# Patient Record
Sex: Female | Born: 1935 | Race: White | Hispanic: No | Smoking: Never smoker
Health system: Southern US, Community
[De-identification: ages and names within clinical notes are randomized; demographics above are authoritative.]

## PROBLEM LIST (undated history)

## (undated) DIAGNOSIS — F03A0 Unspecified dementia, mild, without behavioral disturbance, psychotic disturbance, mood disturbance, and anxiety: Secondary | ICD-10-CM

## (undated) DIAGNOSIS — E538 Deficiency of other specified B group vitamins: Secondary | ICD-10-CM

## (undated) DIAGNOSIS — R251 Tremor, unspecified: Secondary | ICD-10-CM

## (undated) DIAGNOSIS — D649 Anemia, unspecified: Secondary | ICD-10-CM

## (undated) DIAGNOSIS — G20 Parkinson's disease: Secondary | ICD-10-CM

## (undated) DIAGNOSIS — F0390 Unspecified dementia without behavioral disturbance: Secondary | ICD-10-CM

## (undated) DIAGNOSIS — S42309A Unspecified fracture of shaft of humerus, unspecified arm, initial encounter for closed fracture: Secondary | ICD-10-CM

## (undated) DIAGNOSIS — I10 Essential (primary) hypertension: Secondary | ICD-10-CM

## (undated) DIAGNOSIS — M1990 Unspecified osteoarthritis, unspecified site: Secondary | ICD-10-CM

## (undated) DIAGNOSIS — G20A1 Parkinson's disease without dyskinesia, without mention of fluctuations: Secondary | ICD-10-CM

## (undated) HISTORY — DX: Unspecified fracture of shaft of humerus, unspecified arm, initial encounter for closed fracture: S42.309A

## (undated) HISTORY — DX: Tremor, unspecified: R25.1

## (undated) HISTORY — PX: JOINT REPLACEMENT: SHX530

## (undated) HISTORY — DX: Anemia, unspecified: D64.9

## (undated) HISTORY — PX: ABDOMINAL HYSTERECTOMY: SHX81

## (undated) HISTORY — DX: Unspecified osteoarthritis, unspecified site: M19.90

## (undated) HISTORY — DX: Essential (primary) hypertension: I10

---

## 1998-04-13 ENCOUNTER — Ambulatory Visit (HOSPITAL_COMMUNITY): Admission: RE | Admit: 1998-04-13 | Discharge: 1998-04-13 | Payer: Self-pay | Admitting: Obstetrics & Gynecology

## 2003-06-01 ENCOUNTER — Encounter: Payer: Self-pay | Admitting: Orthopedic Surgery

## 2003-06-12 ENCOUNTER — Inpatient Hospital Stay (HOSPITAL_COMMUNITY): Admission: RE | Admit: 2003-06-12 | Discharge: 2003-06-17 | Payer: Self-pay | Admitting: Orthopedic Surgery

## 2003-06-15 ENCOUNTER — Encounter: Payer: Self-pay | Admitting: Orthopedic Surgery

## 2005-06-30 ENCOUNTER — Inpatient Hospital Stay (HOSPITAL_COMMUNITY): Admission: RE | Admit: 2005-06-30 | Discharge: 2005-07-04 | Payer: Self-pay | Admitting: Orthopedic Surgery

## 2005-07-22 ENCOUNTER — Encounter: Payer: Self-pay | Admitting: Orthopedic Surgery

## 2005-07-30 ENCOUNTER — Encounter: Payer: Self-pay | Admitting: Orthopedic Surgery

## 2005-08-29 ENCOUNTER — Encounter: Payer: Self-pay | Admitting: Orthopedic Surgery

## 2006-01-26 ENCOUNTER — Ambulatory Visit: Payer: Self-pay | Admitting: Ophthalmology

## 2009-11-08 ENCOUNTER — Ambulatory Visit: Payer: Self-pay | Admitting: Family Medicine

## 2009-12-06 ENCOUNTER — Ambulatory Visit: Payer: Self-pay | Admitting: Family Medicine

## 2009-12-18 ENCOUNTER — Ambulatory Visit: Payer: Self-pay | Admitting: Family Medicine

## 2010-06-24 ENCOUNTER — Ambulatory Visit: Payer: Self-pay | Admitting: Family Medicine

## 2010-12-11 ENCOUNTER — Ambulatory Visit: Payer: Self-pay | Admitting: Family Medicine

## 2011-12-25 ENCOUNTER — Ambulatory Visit: Payer: Self-pay | Admitting: Family Medicine

## 2013-01-12 ENCOUNTER — Ambulatory Visit: Payer: Self-pay | Admitting: Family Medicine

## 2015-07-04 ENCOUNTER — Encounter: Payer: Self-pay | Admitting: Family Medicine

## 2015-07-04 ENCOUNTER — Ambulatory Visit (INDEPENDENT_AMBULATORY_CARE_PROVIDER_SITE_OTHER): Payer: Medicare Other | Admitting: Family Medicine

## 2015-07-04 VITALS — BP 163/63 | HR 55 | Temp 97.7°F | Ht 58.1 in | Wt 100.0 lb

## 2015-07-04 DIAGNOSIS — I10 Essential (primary) hypertension: Secondary | ICD-10-CM

## 2015-07-04 DIAGNOSIS — Z23 Encounter for immunization: Secondary | ICD-10-CM

## 2015-07-04 DIAGNOSIS — R251 Tremor, unspecified: Secondary | ICD-10-CM | POA: Diagnosis not present

## 2015-07-04 MED ORDER — METOPROLOL SUCCINATE ER 50 MG PO TB24: 50.0000 mg | ORAL_TABLET | Freq: Every day | ORAL | Status: DC

## 2015-07-04 NOTE — Assessment & Plan Note (Signed)
The current medical regimen is effective;  continue present plan and medications.  

## 2015-07-04 NOTE — Assessment & Plan Note (Signed)
Followed by neurology stable 

## 2015-07-04 NOTE — Progress Notes (Signed)
   BP 163/63 mmHg  Pulse 55  Temp(Src) 97.7 F (36.5 C)  Ht 4' 10.1" (1.476 m)  Wt 100 lb (45.36 kg)  BMI 20.82 kg/m2  SpO2 99%   Subjective:    Patient ID: [name], female    DOB: November 08, 1922, 81 y.o.   MRN: [id number]  HPI: [name] is a 81 y.o. female  Chief Complaint  Patient presents with  . Hypertension   patient recheck has been doing well with blood pressure no complaints Patient here with her daughter who provides substantial history and is her caregiver. Patient's tremor medications be adjusted by neurology May be helping some especially with drowsiness.   Relevant past medical, surgical, family and social history reviewed and updated as indicated. Interim medical history since our last visit reviewed. Allergies and medications reviewed and updated.  Review of Systems  Constitutional: Negative.   Respiratory: Negative.   Cardiovascular: Negative.     Per HPI unless specifically indicated above     Objective:    BP 163/63 mmHg  Pulse 55  Temp(Src) 97.7 F (36.5 C)  Ht 4' 10.1" (1.476 m)  Wt 100 lb (45.36 kg)  BMI 20.82 kg/m2  SpO2 99%  Wt Readings from Last 3 Encounters:  07/04/15 100 lb (45.36 kg)  12/20/14 103 lb (46.72 kg)    Physical Exam  Constitutional: She is oriented to person, place, and time. She appears well-developed and well-nourished. No distress.  HENT:  Head: Normocephalic and atraumatic.  Right Ear: Hearing normal.  Left Ear: Hearing normal.  Nose: Nose normal.  Eyes: Conjunctivae and lids are normal. Right eye exhibits no discharge. Left eye exhibits no discharge. No scleral icterus.  Cardiovascular: Normal rate, regular rhythm and normal heart sounds.   Pulmonary/Chest: Effort normal and breath sounds normal. No respiratory distress.  Musculoskeletal: Normal range of motion.  Neurological: She is alert and oriented to person, place, and time.  Skin: Skin is intact. No rash noted.  Psychiatric: She has a normal  mood and affect. Her speech is normal and behavior is normal. Judgment and thought content normal. Cognition and memory are normal.    No results found for this or any previous visit.    Assessment & Plan:   Problem List Items Addressed This Visit      Cardiovascular and Mediastinum   Hypertension    The current medical regimen is effective;  continue present plan and medications.       Relevant Medications   metoprolol succinate (TOPROL-XL) 50 MG 24 hr tablet   Other Relevant Orders   Basic metabolic panel     Other   Tremor    Followed by neurology stable       Other Visit Diagnoses    Immunization due    -  Primary    Relevant Orders    Flu Vaccine QUAD 36+ mos PF IM (Fluarix & Fluzone Quad PF) (Completed)       Patient becoming more feable discuss caregiving issues Follow up plan: Return in about 6 months (around 01/02/2016), or if symptoms worsen or fail to improve, for Physical Exam.

## 2015-07-05 ENCOUNTER — Encounter: Payer: Self-pay | Admitting: Family Medicine

## 2015-07-05 LAB — BASIC METABOLIC PANEL
BUN/Creatinine Ratio: 33 — ABNORMAL HIGH (ref 11–26)
BUN: 25 mg/dL (ref 10–36)
CO2: 26 mmol/L (ref 18–29)
Calcium: 9.6 mg/dL (ref 8.7–10.3)
Chloride: 102 mmol/L (ref 97–108)
Creatinine, Ser: 0.76 mg/dL (ref 0.57–1.00)
GFR calc Af Amer: 80 mL/min/{1.73_m2} (ref >59)
GFR calc non Af Amer: 69 mL/min/{1.73_m2} (ref >59)
Glucose: 105 mg/dL — ABNORMAL HIGH (ref 65–99)
Potassium: 4.9 mmol/L (ref 3.5–5.2)
Sodium: 142 mmol/L (ref 134–144)

## 2015-08-14 ENCOUNTER — Other Ambulatory Visit: Payer: Self-pay | Admitting: Family Medicine

## 2015-10-10 ENCOUNTER — Telehealth: Payer: Self-pay | Admitting: Family Medicine

## 2015-10-10 NOTE — Telephone Encounter (Signed)
This is not urgent.  It can wait unless she has any signs of bleeding

## 2015-10-10 NOTE — Telephone Encounter (Signed)
Pt's daughter Luster LandsbergRenee (on [place] in [place] called stated pt has petechiae on her lower leg and they would like to have her evaluated. Would it be ok to wait until next week to have her evaluated or should this be tended to urgently? Please call pt's daughter ASAP. Thanks.

## 2015-10-10 NOTE — Telephone Encounter (Signed)
Routing to provider  

## 2015-10-10 NOTE — Telephone Encounter (Signed)
Called and spoke to patient's daughter. She stated there was no bleeding and already has appointment scheduled for Monday. She stated they would just keep that appointment.

## 2015-10-15 ENCOUNTER — Ambulatory Visit: Payer: Medicare Other | Admitting: Family Medicine

## 2015-10-23 ENCOUNTER — Ambulatory Visit: Payer: Medicare Other | Admitting: Family Medicine

## 2015-10-29 ENCOUNTER — Telehealth: Payer: Self-pay | Admitting: Family Medicine

## 2015-10-29 ENCOUNTER — Ambulatory Visit (INDEPENDENT_AMBULATORY_CARE_PROVIDER_SITE_OTHER): Payer: Medicare Other | Admitting: Family Medicine

## 2015-10-29 ENCOUNTER — Ambulatory Visit
Admission: RE | Admit: 2015-10-29 | Discharge: 2015-10-29 | Disposition: A | Discharge status: Home / Self Care | Payer: Medicare Other | Source: Ambulatory Visit | Attending: Family Medicine | Admitting: Family Medicine

## 2015-10-29 ENCOUNTER — Encounter: Payer: Self-pay | Admitting: Family Medicine

## 2015-10-29 VITALS — BP 152/63 | HR 74 | Temp 98.2°F | Wt 101.0 lb

## 2015-10-29 DIAGNOSIS — S2231XA Fracture of one rib, right side, initial encounter for closed fracture: Secondary | ICD-10-CM | POA: Diagnosis not present

## 2015-10-29 DIAGNOSIS — R918 Other nonspecific abnormal finding of lung field: Secondary | ICD-10-CM | POA: Diagnosis not present

## 2015-10-29 DIAGNOSIS — R350 Frequency of micturition: Secondary | ICD-10-CM | POA: Diagnosis not present

## 2015-10-29 DIAGNOSIS — R0781 Pleurodynia: Secondary | ICD-10-CM | POA: Diagnosis present

## 2015-10-29 DIAGNOSIS — R21 Rash and other nonspecific skin eruption: Secondary | ICD-10-CM | POA: Diagnosis not present

## 2015-10-29 DIAGNOSIS — N3000 Acute cystitis without hematuria: Secondary | ICD-10-CM

## 2015-10-29 DIAGNOSIS — W19XXXA Unspecified fall, initial encounter: Secondary | ICD-10-CM

## 2015-10-29 DIAGNOSIS — S2249XA Multiple fractures of ribs, unspecified side, initial encounter for closed fracture: Secondary | ICD-10-CM | POA: Insufficient documentation

## 2015-10-29 DIAGNOSIS — E538 Deficiency of other specified B group vitamins: Secondary | ICD-10-CM | POA: Diagnosis not present

## 2015-10-29 DIAGNOSIS — S2241XA Multiple fractures of ribs, right side, initial encounter for closed fracture: Secondary | ICD-10-CM

## 2015-10-29 LAB — CBC WITH DIFFERENTIAL/PLATELET
Hematocrit: 39.9 % (ref 34.0–46.6)
Hemoglobin: 13.5 g/dL (ref 11.1–15.9)
Lymphocytes Absolute: 0.9 10*3/uL (ref 0.7–3.1)
Lymphs: 10 %
MCH: 31.6 pg (ref 26.6–33.0)
MCHC: 33.8 g/dL (ref 31.5–35.7)
MCV: 93 fL (ref 79–97)
MID (Absolute): 0.9 10*3/uL (ref 0.1–1.6)
MID: 10 %
Neutrophils Absolute: 7.3 10*3/uL — ABNORMAL HIGH (ref 1.4–7.0)
Neutrophils: 80 %
Platelets: 244 10*3/uL (ref 150–379)
RBC: 4.27 x10E6/uL (ref 3.77–5.28)
RDW: 13.2 % (ref 12.3–15.4)
WBC: 9.1 10*3/uL (ref 3.4–10.8)

## 2015-10-29 MED ORDER — CIPROFLOXACIN HCL 500 MG PO TABS: 500.0000 mg | ORAL_TABLET | Freq: Two times a day (BID) | ORAL | Status: DC

## 2015-10-29 MED ORDER — MELOXICAM 7.5 MG PO TABS: 7.5000 mg | ORAL_TABLET | Freq: Every day | ORAL | Status: DC
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
## 2015-10-29 NOTE — Telephone Encounter (Signed)
Called and spoke to [place]. Mom fractured Ribs 6-9 with some mild displacement. Will refer to orthopedics due to this displacement. They would like her to see [facility] as they have seen her previously. Referral generated today. Discussed warning signs for which she should go to the ER. They are aware.

## 2015-10-30 ENCOUNTER — Ambulatory Visit: Payer: Medicare Other | Admitting: Family Medicine

## 2015-10-30 ENCOUNTER — Telehealth: Payer: Self-pay | Admitting: Family Medicine

## 2015-10-30 LAB — COMPREHENSIVE METABOLIC PANEL
ALT: 10 IU/L (ref 0–32)
AST: 19 IU/L (ref 0–40)
Albumin/Globulin Ratio: 2 (ref 1.1–2.5)
Albumin: 4.1 g/dL (ref 3.2–4.6)
Alkaline Phosphatase: 71 IU/L (ref 39–117)
BUN/Creatinine Ratio: 27 — ABNORMAL HIGH (ref 11–26)
BUN: 21 mg/dL (ref 10–36)
Bilirubin Total: 0.4 mg/dL (ref 0.0–1.2)
CO2: 23 mmol/L (ref 18–29)
Calcium: 9.4 mg/dL (ref 8.7–10.3)
Chloride: 103 mmol/L (ref 96–106)
Creatinine, Ser: 0.79 mg/dL (ref 0.57–1.00)
GFR calc Af Amer: 76 mL/min/{1.73_m2} (ref >59)
GFR calc non Af Amer: 66 mL/min/{1.73_m2} (ref >59)
Globulin, Total: 2.1 g/dL (ref 1.5–4.5)
Glucose: 110 mg/dL — ABNORMAL HIGH (ref 65–99)
Potassium: 4.5 mmol/L (ref 3.5–5.2)
Sodium: 143 mmol/L (ref 134–144)
Total Protein: 6.2 g/dL (ref 6.0–8.5)

## 2015-10-30 LAB — UA/M W/RFLX CULTURE, ROUTINE
Bilirubin, UA: NEGATIVE
Glucose, UA: NEGATIVE
Ketones, UA: NEGATIVE
Nitrite, UA: NEGATIVE
RBC, UA: NEGATIVE
Specific Gravity, UA: 1.02 (ref 1.005–1.030)
Urobilinogen, Ur: 0.2 mg/dL (ref 0.2–1.0)
pH, UA: 6.5 (ref 5.0–7.5)

## 2015-10-30 LAB — MICROSCOPIC EXAMINATION

## 2015-10-30 LAB — URINE CULTURE, REFLEX

## 2015-10-30 LAB — B12 AND FOLATE PANEL
Folate: >20 ng/mL (ref >3.0)
Vitamin B-12: 173 pg/mL — ABNORMAL LOW (ref 211–946)

## 2015-10-30 MED ORDER — CYANOCOBALAMIN 1000 MCG/ML IJ SOLN: INTRAMUSCULAR | Status: DC

## 2015-10-30 NOTE — Telephone Encounter (Signed)
Please let Ms. Funches's daughter know that her labs looked normal, but her B12 is low again. We can start her on the B12 protocol of weekly injections for 8 weeks and monthly for 6 months after that.

## 2015-10-30 NOTE — Telephone Encounter (Signed)
Rx sent to her pharmacy 

## 2015-10-30 NOTE — Telephone Encounter (Signed)
Please send in a new prescription for B 12 to [facility]

## 2015-11-02 ENCOUNTER — Other Ambulatory Visit: Payer: Medicare Other

## 2015-11-02 ENCOUNTER — Telehealth: Payer: Self-pay | Admitting: Family Medicine

## 2015-11-02 DIAGNOSIS — R410 Disorientation, unspecified: Secondary | ICD-10-CM

## 2015-11-02 DIAGNOSIS — R300 Dysuria: Secondary | ICD-10-CM

## 2015-11-02 LAB — UA/M W/RFLX CULTURE, ROUTINE
Bilirubin, UA: NEGATIVE
Glucose, UA: NEGATIVE
Ketones, UA: NEGATIVE
Leukocytes, UA: NEGATIVE
Nitrite, UA: NEGATIVE
Protein, UA: NEGATIVE
RBC, UA: NEGATIVE
Specific Gravity, UA: 1.02 (ref 1.005–1.030)
Urobilinogen, Ur: 0.2 mg/dL (ref 0.2–1.0)
pH, UA: 6 (ref 5.0–7.5)

## 2015-11-02 NOTE — Telephone Encounter (Signed)
Pt's daughter has concerns she'd like to discuss with Dr Laural Benes.  Please call her.

## 2015-11-02 NOTE — Telephone Encounter (Signed)
Spoke with patients daughter, she is concerned that she mother is having more confusing again since finishing her antibiotic yesterday morning for her UTI. They will bring in a urine to have it rechecked.  Verbal from Dr.johnson urine order placed

## 2015-11-08 ENCOUNTER — Ambulatory Visit: Payer: Medicare Other | Admitting: Family Medicine

## 2015-11-12 ENCOUNTER — Ambulatory Visit (INDEPENDENT_AMBULATORY_CARE_PROVIDER_SITE_OTHER): Payer: Medicare Other | Admitting: Family Medicine

## 2015-11-12 ENCOUNTER — Encounter: Payer: Self-pay | Admitting: Family Medicine

## 2015-11-12 ENCOUNTER — Ambulatory Visit: Payer: Medicare Other | Admitting: Family Medicine

## 2015-11-12 VITALS — BP 141/58 | HR 57 | Temp 98.2°F | Ht 58.4 in | Wt 101.0 lb

## 2015-11-12 DIAGNOSIS — R410 Disorientation, unspecified: Secondary | ICD-10-CM | POA: Insufficient documentation

## 2015-11-12 DIAGNOSIS — S2241XA Multiple fractures of ribs, right side, initial encounter for closed fracture: Secondary | ICD-10-CM

## 2015-11-12 DIAGNOSIS — E538 Deficiency of other specified B group vitamins: Secondary | ICD-10-CM | POA: Diagnosis not present

## 2015-11-12 NOTE — Progress Notes (Signed)
BP 141/58 mmHg  Pulse 57  Temp(Src) 98.2 F (36.8 C)  Ht 4' 10.4" (1.483 m)  Wt 101 lb (45.813 kg)  BMI 20.83 kg/m2  SpO2 98%   Subjective:    Patient ID: [name], female    DOB: [birth year], 82 y.o.   MRN: [id number]  HPI: [name] is a 82 y.o. female  Chief Complaint  Patient presents with  . uti follow-up    Patient went to urgent care over the weekend for confusion, dip did not show anything except protein. They are waiting for the culture results.   Confusion: Was very confused with 1st UTI- better after treatment, but has been waxing and waning over the past couple of weeks. Came in last week for UA because they were concerned about UTI- urine was clear.  Has been up and down. Went to UC this weekend- had just a little bit of protein in her urine.  Yesterday clear headed. Worse at night for the past couple of times Better today, more like herself. Has been weak and tired. Has not heard from the orthopedist about her lungs yet.  Saw her neurologist and they are cutting her off her synamet because they are concerned that it is making her sleepy. Concern that the confusion may be a bit of dementia. Unclear at this time if that's what's going on. She is otherwise eating well. Not needing any medication for her rib pain. No other concerns or complaints at this time.   Saw neurologist thought that there might be a bit of parkinson's. Medicine hasn't help. They are going to take her off the medicine to see if that helps with the fatigue She occasionally leans to the R occasionally.   Relevant past medical, surgical, family and social history reviewed and updated as indicated. Interim medical history since our last visit reviewed. Allergies and medications reviewed and updated.  Review of Systems  Constitutional: Negative.   Respiratory: Negative.   Cardiovascular: Negative.   Genitourinary: Negative.   Neurological: Positive for tremors.  Psychiatric/Behavioral:  Negative.     Per HPI unless specifically indicated above     Objective:    BP 141/58 mmHg  Pulse 57  Temp(Src) 98.2 F (36.8 C)  Ht 4' 10.4" (1.483 m)  Wt 101 lb (45.813 kg)  BMI 20.83 kg/m2  SpO2 98%  Wt Readings from Last 3 Encounters:  11/12/15 101 lb (45.813 kg)  10/29/15 101 lb (45.813 kg)  07/04/15 100 lb (45.36 kg)    Physical Exam  Constitutional: She is oriented to person, place, and time. She appears well-developed and well-nourished. No distress.  HENT:  Head: Normocephalic and atraumatic.  Right Ear: Hearing normal.  Left Ear: Hearing normal.  Nose: Nose normal.  Eyes: Conjunctivae and lids are normal. Right eye exhibits no discharge. Left eye exhibits no discharge. No scleral icterus.  Cardiovascular: Normal rate, regular rhythm, normal heart sounds and intact distal pulses.  Exam reveals no gallop and no friction rub.   No murmur heard. Pulmonary/Chest: Effort normal and breath sounds normal. No respiratory distress. She has no wheezes. She has no rales. She exhibits no tenderness.  Musculoskeletal: Normal range of motion.  Neurological: She is alert and oriented to person, place, and time.  Skin: Skin is warm, dry and intact. No rash noted. No erythema. No pallor.  Psychiatric: She has a normal mood and affect. Her speech is normal and behavior is normal. Judgment and thought content normal. Cognition and memory are  normal.  Nursing note and vitals reviewed.   Results for orders placed or performed in visit on 11/02/15  UA/M w/rflx Culture, Routine  Result Value Ref Range   Specific Gravity, UA 1.020 1.005 - 1.030   pH, UA 6.0 5.0 - 7.5   Color, UA Yellow Yellow   Appearance Ur Clear Clear   Leukocytes, UA Negative Negative   Protein, UA Negative Negative/Trace   Glucose, UA Negative Negative   Ketones, UA Negative Negative   RBC, UA Negative Negative   Bilirubin, UA Negative Negative   Urobilinogen, Ur 0.2 0.2 - 1.0 mg/dL   Nitrite, UA Negative  Negative      Assessment & Plan:   Problem List Items Addressed This Visit      Digestive   B12 deficiency    Back on B12 injections. Continue to monitor.         Nervous and Auditory   Confusion - Primary    Discussed that this is complicated. Possibly due to falls and fractures. Possibly due to medication from UTI staying in system a little longer. We will check thyroid panel to look for any other causes. Other labs were normal. If continues to happen at night, concern for sun-downing. Continue to follow with neurology. Will do MMSE next visit and continue close follow up.       Relevant Orders   Thyroid Panel With TSH     Musculoskeletal and Integument   Traumatic closed displaced fracture of multiple ribs    Unclear why they haven't heard anything. We will contact orthopedics and see what's going on.           Follow up plan: Return 3-4 weeks, for Recheck ribs/confusion.

## 2015-11-12 NOTE — Assessment & Plan Note (Signed)
Back on B12 injections. Continue to monitor.

## 2015-11-12 NOTE — Assessment & Plan Note (Signed)
Unclear why they haven't heard anything. We will contact orthopedics and see what's going on.

## 2015-11-12 NOTE — Assessment & Plan Note (Signed)
Discussed that this is complicated. Possibly due to falls and fractures. Possibly due to medication from UTI staying in system a little longer. We will check thyroid panel to look for any other causes. Other labs were normal. If continues to happen at night, concern for sun-downing. Continue to follow with neurology. Will do MMSE next visit and continue close follow up.

## 2015-11-13 ENCOUNTER — Telehealth: Payer: Self-pay | Admitting: Family Medicine

## 2015-11-13 LAB — THYROID PANEL WITH TSH
Free Thyroxine Index: 2.9 (ref 1.2–4.9)
T3 Uptake Ratio: 36 % (ref 24–39)
T4, Total: 8 ug/dL (ref 4.5–12.0)
TSH: 1.51 u[IU]/mL (ref 0.450–4.500)

## 2015-11-13 NOTE — Telephone Encounter (Signed)
Please let [name]'s daughter know that her thyroid looked nice and normal.

## 2015-11-13 NOTE — Telephone Encounter (Signed)
Patient's daughter notified.

## 2015-11-24 ENCOUNTER — Other Ambulatory Visit: Payer: Self-pay | Admitting: Family Medicine

## 2015-11-24 NOTE — Telephone Encounter (Signed)
Your patient 

## 2015-11-26 ENCOUNTER — Ambulatory Visit (INDEPENDENT_AMBULATORY_CARE_PROVIDER_SITE_OTHER): Payer: Medicare Other | Admitting: Family Medicine

## 2015-11-26 ENCOUNTER — Encounter: Payer: Self-pay | Admitting: Family Medicine

## 2015-11-26 ENCOUNTER — Ambulatory Visit: Payer: Medicare Other | Admitting: Family Medicine

## 2015-11-26 VITALS — BP 164/53 | HR 58 | Temp 98.0°F | Ht 58.3 in | Wt 101.0 lb

## 2015-11-26 DIAGNOSIS — I10 Essential (primary) hypertension: Secondary | ICD-10-CM

## 2015-11-26 DIAGNOSIS — R410 Disorientation, unspecified: Secondary | ICD-10-CM | POA: Diagnosis not present

## 2015-11-26 DIAGNOSIS — R251 Tremor, unspecified: Secondary | ICD-10-CM | POA: Diagnosis not present

## 2015-11-26 LAB — URINALYSIS, ROUTINE W REFLEX MICROSCOPIC
Bilirubin, UA: NEGATIVE
Glucose, UA: NEGATIVE
Leukocytes, UA: NEGATIVE
Nitrite, UA: NEGATIVE
Protein, UA: NEGATIVE
RBC, UA: NEGATIVE
Specific Gravity, UA: 1.025 (ref 1.005–1.030)
Urobilinogen, Ur: 0.2 mg/dL (ref 0.2–1.0)
pH, UA: 5 (ref 5.0–7.5)

## 2015-11-26 NOTE — Assessment & Plan Note (Signed)
Stable for now normal urinalysis

## 2015-11-26 NOTE — Assessment & Plan Note (Signed)
The current medical regimen is effective;  continue present plan and medications.  

## 2015-11-26 NOTE — Progress Notes (Signed)
BP 164/53 mmHg  Pulse 58  Temp(Src) 98 F (36.7 C)  Ht 4' 10.3" (1.481 m)  Wt 101 lb (45.813 kg)  BMI 20.89 kg/m2  SpO2 98%   Subjective:    Patient ID: [name], female    DOB: September 22, 1923, 82 y.o.   MRN: [id number]  HPI: [name] is a 82 y.o. female  Chief Complaint  Patient presents with  . Follow-up   patient here with daughter who helps provide history patient is been taking less Sinemet as not making any difference may be causing some sedation issues. Wants to make sure no UTI is still some confusion with check urinalysis Otherwise doing well medication without issues Some swelling of her right ankle no pain no known trauma or other irritation  Relevant past medical, surgical, family and social history reviewed and updated as indicated. Interim medical history since our last visit reviewed. Allergies and medications reviewed and updated.  Review of Systems  Constitutional: Negative.   Respiratory: Negative.   Cardiovascular: Negative.     Per HPI unless specifically indicated above     Objective:    BP 164/53 mmHg  Pulse 58  Temp(Src) 98 F (36.7 C)  Ht 4' 10.3" (1.481 m)  Wt 101 lb (45.813 kg)  BMI 20.89 kg/m2  SpO2 98%  Wt Readings from Last 3 Encounters:  11/26/15 101 lb (45.813 kg)  11/12/15 101 lb (45.813 kg)  10/29/15 101 lb (45.813 kg)    Physical Exam  Constitutional: She is oriented to person, place, and time. She appears well-developed and well-nourished. No distress.  HENT:  Head: Normocephalic and atraumatic.  Right Ear: Hearing normal.  Left Ear: Hearing normal.  Nose: Nose normal.  Eyes: Conjunctivae and lids are normal. Right eye exhibits no discharge. Left eye exhibits no discharge. No scleral icterus.  Pulmonary/Chest: Effort normal. No respiratory distress.  Musculoskeletal: Normal range of motion.  Right ankle edema no pain with range of motion full range of motion no tenderness  Neurological: She is alert and  oriented to person, place, and time.  Skin: Skin is intact. No rash noted.  Psychiatric: She has a normal mood and affect. Her speech is normal and behavior is normal. Judgment and thought content normal. Cognition and memory are normal.    Results for orders placed or performed in visit on 11/26/15  Urinalysis, Routine w reflex microscopic (not at [facility])  Result Value Ref Range   Specific Gravity, UA 1.025 1.005 - 1.030   pH, UA 5.0 5.0 - 7.5   Color, UA Yellow Yellow   Appearance Ur Clear Clear   Leukocytes, UA Negative Negative   Protein, UA Negative Negative/Trace   Glucose, UA Negative Negative   Ketones, UA Trace (A) Negative   RBC, UA Negative Negative   Bilirubin, UA Negative Negative   Urobilinogen, Ur 0.2 0.2 - 1.0 mg/dL   Nitrite, UA Negative Negative      Assessment & Plan:   Problem List Items Addressed This Visit      Cardiovascular and Mediastinum   Hypertension    The current medical regimen is effective;  continue present plan and medications.         Nervous and Auditory   Confusion - Primary    Stable for now normal urinalysis      Relevant Orders   Urinalysis, Routine w reflex microscopic (not at [facility]) (Completed)     Other   Tremor    Unchanged followed by neurology  Follow up plan: Return in about 3 months (around 02/23/2016), or if symptoms worsen or fail to improve.

## 2015-11-26 NOTE — Assessment & Plan Note (Signed)
Unchanged followed by neurology

## 2015-12-03 ENCOUNTER — Other Ambulatory Visit: Payer: Self-pay | Admitting: Family Medicine

## 2015-12-12 ENCOUNTER — Ambulatory Visit (INDEPENDENT_AMBULATORY_CARE_PROVIDER_SITE_OTHER): Payer: Medicare Other | Admitting: Family Medicine

## 2015-12-12 ENCOUNTER — Encounter: Payer: Self-pay | Admitting: Family Medicine

## 2015-12-12 VITALS — BP 154/64 | HR 57 | Temp 97.8°F | Wt 102.0 lb

## 2015-12-12 DIAGNOSIS — R399 Unspecified symptoms and signs involving the genitourinary system: Secondary | ICD-10-CM | POA: Diagnosis not present

## 2015-12-12 DIAGNOSIS — R531 Weakness: Secondary | ICD-10-CM | POA: Diagnosis not present

## 2015-12-12 LAB — URINALYSIS, ROUTINE W REFLEX MICROSCOPIC
Bilirubin, UA: NEGATIVE
Glucose, UA: NEGATIVE
Ketones, UA: NEGATIVE
Nitrite, UA: NEGATIVE
Protein, UA: NEGATIVE
RBC, UA: NEGATIVE
Specific Gravity, UA: 1.02 (ref 1.005–1.030)
Urobilinogen, Ur: 0.2 mg/dL (ref 0.2–1.0)
pH, UA: 5.5 (ref 5.0–7.5)

## 2015-12-12 LAB — MICROSCOPIC EXAMINATION

## 2015-12-12 NOTE — Progress Notes (Signed)
BP 154/64 mmHg  Pulse 57  Temp(Src) 97.8 F (36.6 C)  Ht   Wt 102 lb (46.267 kg)  SpO2 97%   Subjective:    Patient ID: [name], female    DOB: 03-25-1923, 82 y.o.   MRN: [id number]  HPI: [name] is a 82 y.o. female  Chief Complaint  Patient presents with  . possible UTI   patient with notable change this week with increased confusion and difficulty walking by herself or she was able to get around with minimal assistance previously Patient with increased confusion No focal deficits noted noticed weakness or changes Patient does have swelling in her right lower leg where she had leg trauma back in January As no complaints of pain here No real urinary tract complaints or symptoms   Relevant past medical, surgical, family and social history reviewed and updated as indicated. Interim medical history since our last visit reviewed. Allergies and medications reviewed and updated.  Other than noted above Review of Systems  Constitutional: Negative for fever and chills.  HENT: Negative for congestion.   Respiratory: Negative for cough and wheezing.   Cardiovascular: Positive for leg swelling. Negative for chest pain.  Gastrointestinal: Negative for blood in stool.  Genitourinary: Negative for dysuria, frequency, hematuria and flank pain.  Neurological: Negative for seizures, syncope, facial asymmetry and speech difficulty.    Per HPI unless specifically indicated above     Objective:    BP 154/64 mmHg  Pulse 57  Temp(Src) 97.8 F (36.6 C)  Ht   Wt 102 lb (46.267 kg)  SpO2 97%  Wt Readings from Last 3 Encounters:  12/12/15 102 lb (46.267 kg)  11/26/15 101 lb (45.813 kg)  11/12/15 101 lb (45.813 kg)    Physical Exam  Constitutional: She is oriented to person, place, and time. She appears well-developed and well-nourished. No distress.  HENT:  Head: Normocephalic and atraumatic.  Right Ear: Hearing normal.  Left Ear: Hearing normal.  Nose: Nose  normal.  Eyes: Conjunctivae and lids are normal. Right eye exhibits no discharge. Left eye exhibits no discharge. No scleral icterus.  Cardiovascular: Normal rate, regular rhythm and normal heart sounds.   Pulmonary/Chest: Effort normal and breath sounds normal. No respiratory distress.  Abdominal: Soft. There is no tenderness.  Musculoskeletal: Normal range of motion.  Right lower leg with 2+ edema  Neurological: She is alert and oriented to person, place, and time.  Marked tremor of hands which is no change  Skin: Skin is intact. No rash noted.  Psychiatric: She has a normal mood and affect. Her speech is normal and behavior is normal. Judgment normal. Cognition and memory are normal.    Results for orders placed or performed in visit on 11/26/15  Urinalysis, Routine w reflex microscopic (not at [facility])  Result Value Ref Range   Specific Gravity, UA 1.025 1.005 - 1.030   pH, UA 5.0 5.0 - 7.5   Color, UA Yellow Yellow   Appearance Ur Clear Clear   Leukocytes, UA Negative Negative   Protein, UA Negative Negative/Trace   Glucose, UA Negative Negative   Ketones, UA Trace (A) Negative   RBC, UA Negative Negative   Bilirubin, UA Negative Negative   Urobilinogen, Ur 0.2 0.2 - 1.0 mg/dL   Nitrite, UA Negative Negative      Assessment & Plan:   Problem List Items Addressed This Visit    None    Visit Diagnoses    UTI symptoms    -  Primary    Check urine culture and sensitivity    Relevant Orders    Urinalysis, Routine w reflex microscopic ([facility])    Urine culture    Weakness generalized        We'll check urine culture and sensitivity urinalysis normal observed walking and any further changes please notify Check BMP reviewed old labs    Relevant Orders    Basic metabolic panel        Follow up plan: Return if symptoms worsen or fail to improve, for As scheduled.

## 2015-12-13 ENCOUNTER — Encounter: Payer: Self-pay | Admitting: Family Medicine

## 2015-12-13 LAB — BASIC METABOLIC PANEL
BUN/Creatinine Ratio: 28 — ABNORMAL HIGH (ref 11–26)
BUN: 21 mg/dL (ref 10–36)
CO2: 25 mmol/L (ref 18–29)
Calcium: 9.5 mg/dL (ref 8.7–10.3)
Chloride: 101 mmol/L (ref 96–106)
Creatinine, Ser: 0.76 mg/dL (ref 0.57–1.00)
GFR calc Af Amer: 80 mL/min/{1.73_m2} (ref >59)
GFR calc non Af Amer: 69 mL/min/{1.73_m2} (ref >59)
Glucose: 82 mg/dL (ref 65–99)
Potassium: 4.3 mmol/L (ref 3.5–5.2)
Sodium: 140 mmol/L (ref 134–144)

## 2015-12-13 LAB — URINE CULTURE

## 2015-12-16 ENCOUNTER — Inpatient Hospital Stay
Admission: EM | Admit: 2015-12-16 | Discharge: 2015-12-24 | DRG: 177 | Disposition: A | Discharge status: Skilled Nursing Facility | Payer: Medicare Other | Attending: Internal Medicine | Admitting: Internal Medicine

## 2015-12-16 ENCOUNTER — Emergency Department: Payer: Medicare Other

## 2015-12-16 ENCOUNTER — Encounter: Payer: Self-pay | Admitting: Emergency Medicine

## 2015-12-16 DIAGNOSIS — G20 Parkinson's disease: Secondary | ICD-10-CM | POA: Diagnosis present

## 2015-12-16 DIAGNOSIS — R1310 Dysphagia, unspecified: Secondary | ICD-10-CM

## 2015-12-16 DIAGNOSIS — J690 Pneumonitis due to inhalation of food and vomit: Principal | ICD-10-CM | POA: Diagnosis present

## 2015-12-16 DIAGNOSIS — Z888 Allergy status to other drugs, medicaments and biological substances status: Secondary | ICD-10-CM

## 2015-12-16 DIAGNOSIS — G20A1 Parkinson's disease without dyskinesia, without mention of fluctuations: Secondary | ICD-10-CM

## 2015-12-16 DIAGNOSIS — Z9660 Presence of unspecified orthopedic joint implant: Secondary | ICD-10-CM | POA: Diagnosis present

## 2015-12-16 DIAGNOSIS — Z7982 Long term (current) use of aspirin: Secondary | ICD-10-CM

## 2015-12-16 DIAGNOSIS — Z90710 Acquired absence of both cervix and uterus: Secondary | ICD-10-CM

## 2015-12-16 DIAGNOSIS — R4182 Altered mental status, unspecified: Secondary | ICD-10-CM

## 2015-12-16 DIAGNOSIS — G9341 Metabolic encephalopathy: Secondary | ICD-10-CM | POA: Diagnosis present

## 2015-12-16 DIAGNOSIS — D72829 Elevated white blood cell count, unspecified: Secondary | ICD-10-CM

## 2015-12-16 DIAGNOSIS — Z79899 Other long term (current) drug therapy: Secondary | ICD-10-CM

## 2015-12-16 DIAGNOSIS — R9389 Abnormal findings on diagnostic imaging of other specified body structures: Secondary | ICD-10-CM

## 2015-12-16 DIAGNOSIS — M1990 Unspecified osteoarthritis, unspecified site: Secondary | ICD-10-CM | POA: Diagnosis present

## 2015-12-16 DIAGNOSIS — J189 Pneumonia, unspecified organism: Secondary | ICD-10-CM | POA: Diagnosis present

## 2015-12-16 DIAGNOSIS — Z882 Allergy status to sulfonamides status: Secondary | ICD-10-CM

## 2015-12-16 DIAGNOSIS — R443 Hallucinations, unspecified: Secondary | ICD-10-CM | POA: Diagnosis present

## 2015-12-16 DIAGNOSIS — I10 Essential (primary) hypertension: Secondary | ICD-10-CM | POA: Diagnosis present

## 2015-12-16 DIAGNOSIS — F0280 Dementia in other diseases classified elsewhere without behavioral disturbance: Secondary | ICD-10-CM | POA: Diagnosis present

## 2015-12-16 HISTORY — DX: Deficiency of other specified B group vitamins: E53.8

## 2015-12-16 LAB — CBC
HCT: 37.8 % (ref 35.0–47.0)
Hemoglobin: 13.1 g/dL (ref 12.0–16.0)
MCH: 31.1 pg (ref 26.0–34.0)
MCHC: 34.5 g/dL (ref 32.0–36.0)
MCV: 90 fL (ref 80.0–100.0)
Platelets: 227 10*3/uL (ref 150–440)
RBC: 4.2 MIL/uL (ref 3.80–5.20)
RDW: 13.4 % (ref 11.5–14.5)
WBC: 8.2 10*3/uL (ref 3.6–11.0)

## 2015-12-16 LAB — URINALYSIS COMPLETE WITH MICROSCOPIC (ARMC ONLY)
Bacteria, UA: NONE SEEN
Bilirubin Urine: NEGATIVE
Glucose, UA: NEGATIVE mg/dL
Hgb urine dipstick: NEGATIVE
Leukocytes, UA: NEGATIVE
Nitrite: NEGATIVE
Protein, ur: NEGATIVE mg/dL
Specific Gravity, Urine: 1.019 (ref 1.005–1.030)
Squamous Epithelial / LPF: NONE SEEN
pH: 6 (ref 5.0–8.0)

## 2015-12-16 LAB — COMPREHENSIVE METABOLIC PANEL
ALT: <5 U/L — ABNORMAL LOW (ref 14–54)
AST: 25 U/L (ref 15–41)
Albumin: 4.1 g/dL (ref 3.5–5.0)
Alkaline Phosphatase: 66 U/L (ref 38–126)
Anion gap: 6 (ref 5–15)
BUN: 22 mg/dL — ABNORMAL HIGH (ref 6–20)
CO2: 27 mmol/L (ref 22–32)
Calcium: 9.6 mg/dL (ref 8.9–10.3)
Chloride: 106 mmol/L (ref 101–111)
Creatinine, Ser: 0.58 mg/dL (ref 0.44–1.00)
GFR calc Af Amer: >60 mL/min (ref >60)
GFR calc non Af Amer: >60 mL/min (ref >60)
Glucose, Bld: 98 mg/dL (ref 65–99)
Potassium: 3.7 mmol/L (ref 3.5–5.1)
Sodium: 139 mmol/L (ref 135–145)
Total Bilirubin: 0.9 mg/dL (ref 0.3–1.2)
Total Protein: 6.8 g/dL (ref 6.5–8.1)

## 2015-12-16 LAB — TROPONIN I: Troponin I: <0.03 ng/mL (ref <0.031)

## 2015-12-16 MED ORDER — POTASSIUM CHLORIDE IN NACL 20-0.9 MEQ/L-% IV SOLN
INTRAVENOUS | Status: DC
  Administered 2015-12-16 – 2015-12-17 (×3): via INTRAVENOUS
  Filled 2015-12-16 (×5): qty 1000

## 2015-12-16 MED ORDER — LEVOFLOXACIN 750 MG PO TABS: 750.0000 mg | ORAL_TABLET | Freq: Once | ORAL | Status: DC

## 2015-12-16 MED ORDER — ACETAMINOPHEN 325 MG PO TABS: 650.0000 mg | ORAL_TABLET | Freq: Four times a day (QID) | ORAL | Status: DC | PRN

## 2015-12-16 MED ORDER — IPRATROPIUM-ALBUTEROL 0.5-2.5 (3) MG/3ML IN SOLN
3.0000 mL | Freq: Four times a day (QID) | RESPIRATORY_TRACT | Status: DC
  Administered 2015-12-16 – 2015-12-17 (×4): 3 mL via RESPIRATORY_TRACT
  Filled 2015-12-16 (×4): qty 3

## 2015-12-16 MED ORDER — ONDANSETRON HCL 4 MG PO TABS: 4.0000 mg | ORAL_TABLET | Freq: Four times a day (QID) | ORAL | Status: DC | PRN

## 2015-12-16 MED ORDER — LEVOFLOXACIN IN D5W 750 MG/150ML IV SOLN
750.0000 mg | Freq: Once | INTRAVENOUS | Status: AC
  Administered 2015-12-16: 750 mg via INTRAVENOUS
  Filled 2015-12-16: qty 150

## 2015-12-16 MED ORDER — ACETAMINOPHEN 650 MG RE SUPP: 650.0000 mg | Freq: Four times a day (QID) | RECTAL | Status: DC | PRN

## 2015-12-16 MED ORDER — CARBIDOPA-LEVODOPA ER 25-100 MG PO TBCR
1.0000 | EXTENDED_RELEASE_TABLET | ORAL | Status: DC
  Administered 2015-12-17: 1 via ORAL
  Filled 2015-12-16: qty 1

## 2015-12-16 MED ORDER — ASPIRIN EC 81 MG PO TBEC
81.0000 mg | DELAYED_RELEASE_TABLET | Freq: Every day | ORAL | Status: DC
  Administered 2015-12-17 – 2015-12-24 (×8): 81 mg via ORAL
  Filled 2015-12-16 (×9): qty 1

## 2015-12-16 MED ORDER — METOPROLOL SUCCINATE ER 50 MG PO TB24
50.0000 mg | ORAL_TABLET | Freq: Every day | ORAL | Status: DC
  Administered 2015-12-17 – 2015-12-18 (×2): 50 mg via ORAL
  Filled 2015-12-16 (×2): qty 1

## 2015-12-16 MED ORDER — IOHEXOL 300 MG/ML  SOLN
50.0000 mL | Freq: Once | INTRAMUSCULAR | Status: AC | PRN
  Administered 2015-12-16: 50 mL via INTRAVENOUS

## 2015-12-16 MED ORDER — LEVOFLOXACIN IN D5W 500 MG/100ML IV SOLN
500.0000 mg | INTRAVENOUS | Status: DC
  Filled 2015-12-16: qty 100

## 2015-12-16 MED ORDER — CARBIDOPA-LEVODOPA 25-100 MG PO TABS
1.0000 | ORAL_TABLET | ORAL | Status: DC
  Administered 2015-12-17: 1 via ORAL
  Filled 2015-12-16: qty 1

## 2015-12-16 MED ORDER — DOCUSATE SODIUM 100 MG PO CAPS
100.0000 mg | ORAL_CAPSULE | Freq: Two times a day (BID) | ORAL | Status: DC
  Administered 2015-12-19 – 2015-12-20 (×3): 100 mg via ORAL
  Filled 2015-12-16 (×8): qty 1

## 2015-12-16 MED ORDER — MORPHINE SULFATE (PF) 2 MG/ML IV SOLN: 2.0000 mg | INTRAVENOUS | Status: DC | PRN

## 2015-12-16 MED ORDER — LEVOFLOXACIN IN D5W 250 MG/50ML IV SOLN
250.0000 mg | INTRAVENOUS | Status: DC
  Administered 2015-12-17 – 2015-12-18 (×2): 250 mg via INTRAVENOUS
  Filled 2015-12-16 (×3): qty 50

## 2015-12-16 MED ORDER — ONDANSETRON HCL 4 MG/2ML IJ SOLN: 4.0000 mg | Freq: Four times a day (QID) | INTRAMUSCULAR | Status: DC | PRN

## 2015-12-16 MED ORDER — HEPARIN SODIUM (PORCINE) 5000 UNIT/ML IJ SOLN
5000.0000 [IU] | Freq: Three times a day (TID) | INTRAMUSCULAR | Status: DC
  Administered 2015-12-16: 5000 [IU] via SUBCUTANEOUS
  Filled 2015-12-16 (×2): qty 1

## 2015-12-16 MED ORDER — BISACODYL 10 MG RE SUPP: 10.0000 mg | Freq: Every day | RECTAL | Status: DC | PRN

## 2015-12-16 NOTE — ED Notes (Signed)
Admitting MD at bedside at this time. MD notified of patient failing swallow screen.

## 2015-12-16 NOTE — ED Notes (Signed)
Report called to Dawn, RN

## 2015-12-16 NOTE — Care Management Obs Status (Signed)
MEDICARE OBSERVATION STATUS NOTIFICATION   Patient Details  Name: [name] MRN: [id number] Date of Birth: [birth year]   Medicare Observation Status Notification Given:  Yes (altered mental status / signed by daughter)    [name], RN 12/16/2015, 6:28 PM

## 2015-12-16 NOTE — ED Notes (Signed)
Per Dr. Judithann SheenSparks, patient okay to have sips of water with medications.

## 2015-12-16 NOTE — ED Notes (Signed)
Pt presents to ED from home via [facility]. Per EMS pt lives at home with her husband, and has 2 daughters as her caregivers. EMS states primary complaint is increased weakness, and family also c/o altered mental status. Pt's daughter states confusion has happened occasionally in the evenings but pt has had worsening confusion during the day. Pt's daughter states that on 3/12 pt was taken off her parkinson's meds (pt has hx of essential tremors with parkinsons disease), pt was taken off Cinemet XR and Cinemet. Pt's daughter reports worsening of tremors. During triage patient is noted to be disoriented. Pt's daughter also states that patient has been having conversations in her sleep. Pt is disoriented x 4 on arrival, but is able to follow commands.

## 2015-12-16 NOTE — Progress Notes (Signed)
Pharmacy Antibiotic Note  Elmarie Mainlandttalene W Kruse is a 82 y.o. female admitted on 12/16/2015 with pneumonia.  Patient was initiated on Levaquin 500mg  IV q24h, recevied dose of 750mg  IV in the ED already.  Plan: The dose of Levaquin will be adjusted to 250mg  IV q24h based on renal function.  Height: 5\' 1"  (154.9 cm) Weight: 107 lb 5.8 oz (48.7 kg) IBW/kg (Calculated) : 47.8  Temp (24hrs), Avg:98.3 F (36.8 C), Min:98.3 F (36.8 C), Max:98.3 F (36.8 C)   Recent Labs Lab 12/12/15 1131 12/16/15 1419  WBC  --  8.2  CREATININE 0.76 0.58    Estimated Creatinine Clearance: 35.3 mL/min (by C-G formula based on Cr of 0.58).    Allergies  Allergen Reactions  . Phenergan [Promethazine Hcl] Other (See Comments)    Family states the patient almost went into a coma.  . Sulfa Antibiotics Other (See Comments)    Unknown reaction   Thank you for allowing pharmacy to be a part of this patient's care.  Clovia CuffLisa Reyah Streeter, PharmD, BCPS 12/16/2015 7:49 PM

## 2015-12-16 NOTE — ED Notes (Signed)
Pt noted to be becoming increasingly agitated when it comes to taking BP and keeping pulse ox on. This RN notified EDP and per EDP pt is okay to be off the monitor since her daughter is at bedside. This RN removed monitor and placed patient on bedpan at this time. Pt noted to be increasingly agitated and confused at this time.

## 2015-12-16 NOTE — ED Provider Notes (Signed)
[facility] Emergency Department Provider Note  Time seen: 2:08 PM  I have reviewed the triage vital signs and the nursing notes.   HISTORY  Chief Complaint Altered Mental Status and Weakness    HPI [name] is a 82 y.o. female with a past medical history of anemia, arthritis, hypertension, tremor, possibly Parkinson's, who presents the emergency department with confusion and weakness. According to the daughter the patient was on Sinemet for Parkinson's/tremor. Her doctor had been weaning her off of this and they had stopped it one week ago. Daughter noticed since they stopped that the patient has had significant weakness, very unsteady walking, and today she was confused and did not recognize the daughter. Daughter states they noticed the increased tremor after stopping the Sinemet so they saw her primary care physician and restarted it on Wednesday. The tremor has improved but the generalized weakness is still present, and today the patient was having trouble ambulating and holding things in her hands. Daughter states she would grab something it would slip out of her grip. Daughter has not noticed any one side worse than the other as far as her weakness. She states it is very abnormal for her to be confused, and she has never not known who she is.    Past Medical History  Diagnosis Date  . Anemia   . Arthritis   . Hypertension   . Tremor   . B12 deficiency     Patient Active Problem List   Diagnosis Date Noted  . Confusion 11/12/2015  . B12 deficiency 10/29/2015  . Traumatic closed displaced fracture of multiple ribs 10/29/2015  . Tremor 07/04/2015  . Hypertension     Past Surgical History  Procedure Laterality Date  . Abdominal hysterectomy    . Joint replacement      Current Outpatient Rx  Name  Route  Sig  Dispense  Refill  . aspirin EC 81 MG tablet   Oral   Take 81 mg by mouth daily.         . calcium gluconate 500 MG tablet  Oral   Take 2 tablets by mouth daily.         . carbidopa-levodopa (SINEMET IR) 25-100 MG tablet   Oral   Take 1 tablet by mouth daily.          . cyanocobalamin (,VITAMIN B-12,) 1000 MCG/ML injection      injected into the muscle 1x per week for 4 weeks, then monthly for 6 months.   30 mL   0   . metoprolol succinate (TOPROL-XL) 50 MG 24 hr tablet      TAKE 1 TABLET BY MOUTH ONCE EVERY DAY   30 tablet   3   . Multiple Vitamins-Minerals (MULTIVITAMIN ADULT PO)   Oral   Take by mouth daily.           Allergies Phenergan and Sulfa antibiotics  Family History  Problem Relation Age of Onset  . Family history unknown: Yes    Social History Social History  Substance Use Topics  . Smoking status: Never Smoker   . Smokeless tobacco: Never Used  . Alcohol Use: No    Review of Systems Constitutional: Negative for fever. Cardiovascular: Negative for chest pain. Respiratory: Negative for shortness of breath. Gastrointestinal: Negative for abdominal pain Genitourinary: Negative for dysuria. Neurological: Negative for headache. Generalized weakness. 10-point ROS otherwise negative.  ____________________________________________   PHYSICAL EXAM:  VITAL SIGNS: ED Triage Vitals  Enc  Vitals Group     BP 12/16/15 1403 151/68 mmHg     Pulse Rate 12/16/15 1403 59     Resp 12/16/15 1403 15     Temp 12/16/15 1403 98.3 F (36.8 C)     Temp Source 12/16/15 1403 Oral     SpO2 12/16/15 1358 99 %     Weight 12/16/15 1403 107 lb 5.8 oz (48.7 kg)     Height 12/16/15 1403 5\' 1"  (1.549 m)     Head Cir --      Peak Flow --      Pain Score --      Pain Loc --      Pain Edu? --      Excl. in GC? --     Constitutional: Alert.  Tremor noted in bilateral upper extremities. Eyes: Normal exam ENT   Head: Normocephalic and atraumatic   Mouth/Throat: Mucous membranes are moist. Cardiovascular: Normal rate, regular rhythm. No murmur Respiratory: Normal  respiratory effort without tachypnea nor retractions. Breath sounds are clear Gastrointestinal: Soft and nontender. No distention.   Musculoskeletal: Nontender with normal range of motion in all extremities.  Neurologic:  Patient mumbling to herself at times. Difficult exam given her tremor however appears to have equal grip strengths in bilateral upper extremities. Appears to be slightly weaker in the right lower extremity than left lower extremity, but this could be positional. Skin:  Skin is warm, dry and intact.  Psychiatric: Mood and affect are normal.  ____________________________________________     RADIOLOGY  CT head shows no acute findings. X-ray shows a possible right hilar mass.   EKG reviewed and interpreted by myself shows sinus rhythm at 58 bpm, widened QRS, left bundle branch block. Nonspecific but no concerning ST changes.  ____________________________________________   INITIAL IMPRESSION / ASSESSMENT AND PLAN / ED COURSE  Pertinent labs & imaging results that were available during my care of the patient were reviewed by me and considered in my medical decision making (see chart for details).  Patient presents the emergency department with confusion, generalized weakness, and ataxia. Daughter states normally the patient is able to ambulate without assistance, and largely converses normally. Given this acute change we will check labs, CT head, urinalysis and closely monitor in the emergency department.  Labs are largely within normal limits. CT shows no acute findings. X-ray shows a possible right hilar mass. We'll proceed with a CT scan to rule out mass/pneumonia. Currently awaiting urinalysis results. Family is now here stating that the patient appears to be leaning to the right and when she ambulates she seems to fall to the right. CT is negative however given her increased confusion and deficits were seemed to favor right-sided weakness we will likely admit to the  hospital after workup is complete.  CT and urinalysis pending. Patient care signed out to Dr. Fanny BienQuale.  ____________________________________________   FINAL CLINICAL IMPRESSION(S) / ED DIAGNOSES  Generalized weakness Delirium    Minna AntisKevin Labaron Digirolamo, MD 12/16/15 1558

## 2015-12-16 NOTE — H&P (Signed)
History and Physical    [name] ZOX:[id number] DOB: [birth year] DOA: 12/16/2015  Referring physician: Dr. Fanny Bien PCP: Vonita Moss, MD  Specialists: Dr. Malvin Johns  Chief Complaint: confusion  HPI: [name] is a 82 y.o. female has a past medical history significant for Parkinson's and HTN who lives with her husband at home. Has been having worsening issues with her Parkinson's and her meds have been adjusted recently. Presents now with worsening confusion and tremor, falling to the right. Slight cough. Has been eating normally at home. No fever. No change in bowels or bladder. W/u in ER including head CT non-diagnostic. Family unable to care for patient in this condition. She is now admitted.  Review of Systems: The family denies anorexia, fever, weight loss,, vision loss, decreased hearing, hoarseness, chest pain, syncope, dyspnea on exertion, peripheral edema,  hemoptysis, abdominal pain, melena, hematochezia, severe indigestion/heartburn, hematuria, incontinence, genital sores, muscle weakness, suspicious skin lesions, transient blindness, depression, unusual weight change, abnormal bleeding, enlarged lymph nodes, angioedema, and breast masses.   Past Medical History  Diagnosis Date  . Anemia   . Arthritis   . Hypertension   . Tremor   . B12 deficiency    Past Surgical History  Procedure Laterality Date  . Abdominal hysterectomy    . Joint replacement     Social History:  reports that she has never smoked. She has never used smokeless tobacco. She reports that she does not drink alcohol or use illicit drugs.  Allergies  Allergen Reactions  . Phenergan [Promethazine Hcl] Other (See Comments)    Family states the patient almost went into a coma.  . Sulfa Antibiotics Other (See Comments)    Unknown reaction    Family History  Problem Relation Age of Onset  . Family history unknown: Yes    Prior to Admission medications   Medication Sig Start Date End Date  Taking? Authorizing Provider  aspirin EC 81 MG tablet Take 81 mg by mouth daily.    Historical Provider, MD  carbidopa-levodopa (SINEMET IR) 25-100 MG tablet Take 1 tablet by mouth daily.     Historical Provider, MD  cyanocobalamin (,VITAMIN B-12,) 1000 MCG/ML injection injected into the muscle 1x per week for 4 weeks, then monthly for 6 months. 10/30/15   Megan P Johnson, DO  metoprolol succinate (TOPROL-XL) 50 MG 24 hr tablet TAKE 1 TABLET BY MOUTH ONCE EVERY DAY 12/04/15   Steele Sizer, MD  Multiple Vitamins-Minerals (MULTIVITAMIN ADULT PO) Take by mouth daily.    Historical Provider, MD   Physical Exam: Filed Vitals:   12/16/15 1656 12/16/15 1704 12/16/15 1730 12/16/15 1752  BP: 159/109 109/70 153/86   Pulse: 61 61 71   Temp:    98.3 F (36.8 C)  TempSrc:      Resp: 19 11    Height:      Weight:      SpO2: 97% 96% 97%      General: Chronically ill appearing but in  no apparent distress, [place]/AT, WDWN  Eyes: PERRL, EOMI, no scleral icterus, conjunctiva clear  ENT: moist oropharynx, dentition fair, TM's benign  Neck: supple, no lymphadenopathy. No bruits. No thyromegaly  Cardiovascular: regular rate without MRG; 2+ peripheral pulses, no JVD, no peripheral edema  Respiratory: basilar rhonchi without wheezes or rales, respiratory effort normal. No dullness  Abdomen: soft, non tender to palpation, positive bowel sounds, no guarding, no rebound, no organomegaly  Skin: no rashes or lesions  Musculoskeletal: normal bulk and  tone, no joint swelling  Psychiatric: normal mood and affect, alert, oriented to person and place, not time  Neurologic: CN 2-12 grossly intact, Motor strength 4+/5 in all 4 groups with diffuse tremor noted, gait not tested. DTR's symmetric  Labs on Admission:  Basic Metabolic Panel:  Recent Labs Lab 12/12/15 1131 12/16/15 1419  NA 140 139  K 4.3 3.7  CL 101 106  CO2 25 27  GLUCOSE 82 98  BUN 21 22*  CREATININE 0.76 0.58  CALCIUM 9.5 9.6    Liver Function Tests:  Recent Labs Lab 12/16/15 1419  AST 25  ALT <5*  ALKPHOS 66  BILITOT 0.9  PROT 6.8  ALBUMIN 4.1   No results for input(s): LIPASE, AMYLASE in the last 168 hours. No results for input(s): AMMONIA in the last 168 hours. CBC:  Recent Labs Lab 12/16/15 1419  WBC 8.2  HGB 13.1  HCT 37.8  MCV 90.0  PLT 227   Cardiac Enzymes:  Recent Labs Lab 12/16/15 1419  TROPONINI <0.03    BNP (last 3 results) No results for input(s): BNP in the last 8760 hours.  ProBNP (last 3 results) No results for input(s): PROBNP in the last 8760 hours.  CBG: No results for input(s): GLUCAP in the last 168 hours.  Radiological Exams on Admission: Ct Head Wo Contrast  12/16/2015  CLINICAL DATA:  Generalized weakness.  Confusion.  Parkinson's. EXAM: CT HEAD WITHOUT CONTRAST TECHNIQUE: Contiguous axial images were obtained from the base of the skull through the vertex without intravenous contrast. COMPARISON:  None. FINDINGS: Generalized atrophy, moderate in degree. Ventricular enlargement consistent with atrophy. Mild chronic microvascular ischemic change in the white matter. No acute infarct. Negative for hemorrhage or mass lesion. No edema or shift of the midline structures. Normal calvarium. Mild mucosal edema in the right sphenoid sinus. Mild vascular calcification IMPRESSION: Atrophy and mild chronic microvascular ischemia. No acute abnormality Electronically Signed   By: Marlan Palauharles  Clark M.D.   On: 12/16/2015 14:58   Ct Chest W Contrast  12/16/2015  CLINICAL DATA:  Possible right hilar mass on a portable chest earlier today. EXAM: CT CHEST WITH CONTRAST TECHNIQUE: Multidetector CT imaging of the chest was performed during intravenous contrast administration. CONTRAST:  50mL OMNIPAQUE IOHEXOL 300 MG/ML  SOLN COMPARISON:  Previous chest radiographs, including earlier today. FINDINGS: Mediastinum/Lymph Nodes: Pericardial effusion with a maximum thickness of 9 mm. Dense  atheromatous coronary artery calcifications. The recently suspected right hilar mass is due to right pulmonary artery prominence. No true mass or enlarged lymph nodes seen. Lungs/Pleura: Mild patchy opacity in the posterior aspect of the right lower lobe. Minimal patchy opacity in the left lower lobe. Small amount of linear scarring or atelectasis in both lower lobes. No pleural fluid. Upper abdomen: 3.7 cm splenic cyst. Mild scarring of the upper pole of the left kidney. Musculoskeletal: Partially healed right posterior seventh, eighth, ninth and tenth rib fractures. Healed right lateral and posterolateral fifth, sixth, seventh, eighth and ninth rib fractures. Mild thoracic spine degenerative changes. IMPRESSION: 1. The recently suspected right hilar mass is a prominent right pulmonary artery, suggesting pulmonary arterial hypertension. 2. Mild patchy opacity in the right lower lobe and minimal patchy opacity in the left lower lobe. This could represent pneumonia or patchy atelectasis. 3. Pericardial effusion with a maximum thickness of 9 mm. Electronically Signed   By: Beckie SaltsSteven  Reid M.D.   On: 12/16/2015 16:48   Dg Chest Portable 1 View  12/16/2015  CLINICAL DATA:  Worsening generalized  weakness and increased confusion over the past week. EXAM: PORTABLE CHEST 1 VIEW COMPARISON:  Previous examinations, the most recent dated 10/29/2015. FINDINGS: Stable enlarged cardiac silhouette. The right hilum has a prominent, oval configuration laterally. This is asymmetrical compared to the left hilum. Clear lungs. Minimally prominent interstitial markings. No pleural fluid. Diffuse osteopenia. IMPRESSION: 1. Possible right hilar mass. Further evaluation with PA and lateral chest radiographs is recommended when patient is able. 2. Stable cardiomegaly and minimal chronic interstitial lung disease. Electronically Signed   By: Beckie Salts M.D.   On: 12/16/2015 15:09    EKG: Independently  reviewed.  Assessment/Plan Principal Problem:   Altered mental status Active Problems:   Parkinson's disease (HCC)   Dysphagia   Abnormal CXR   Will observe on floor with empiric IV fluids and IV ABX. Check MRI of brain and consult Neurology. Consult PT, ST, and CM. Repeat labs in AM.  Diet: NPO Fluids: NS with K+@100  DVT Prophylaxis: SQ Heparin  Code Status: FULL  Family Communication: yes  Disposition Plan: home  Time spent: 50 min

## 2015-12-17 ENCOUNTER — Inpatient Hospital Stay: Payer: Medicare Other

## 2015-12-17 DIAGNOSIS — R1310 Dysphagia, unspecified: Secondary | ICD-10-CM | POA: Diagnosis present

## 2015-12-17 DIAGNOSIS — Z882 Allergy status to sulfonamides status: Secondary | ICD-10-CM | POA: Diagnosis not present

## 2015-12-17 DIAGNOSIS — G9341 Metabolic encephalopathy: Secondary | ICD-10-CM

## 2015-12-17 DIAGNOSIS — Z9660 Presence of unspecified orthopedic joint implant: Secondary | ICD-10-CM | POA: Diagnosis present

## 2015-12-17 DIAGNOSIS — G20 Parkinson's disease: Secondary | ICD-10-CM | POA: Diagnosis present

## 2015-12-17 DIAGNOSIS — Z888 Allergy status to other drugs, medicaments and biological substances status: Secondary | ICD-10-CM | POA: Diagnosis not present

## 2015-12-17 DIAGNOSIS — R443 Hallucinations, unspecified: Secondary | ICD-10-CM | POA: Diagnosis present

## 2015-12-17 DIAGNOSIS — J189 Pneumonia, unspecified organism: Secondary | ICD-10-CM | POA: Diagnosis present

## 2015-12-17 DIAGNOSIS — M1990 Unspecified osteoarthritis, unspecified site: Secondary | ICD-10-CM | POA: Diagnosis present

## 2015-12-17 DIAGNOSIS — I10 Essential (primary) hypertension: Secondary | ICD-10-CM | POA: Diagnosis present

## 2015-12-17 DIAGNOSIS — Z79899 Other long term (current) drug therapy: Secondary | ICD-10-CM | POA: Diagnosis not present

## 2015-12-17 DIAGNOSIS — F0280 Dementia in other diseases classified elsewhere without behavioral disturbance: Secondary | ICD-10-CM | POA: Diagnosis present

## 2015-12-17 DIAGNOSIS — Z90710 Acquired absence of both cervix and uterus: Secondary | ICD-10-CM | POA: Diagnosis not present

## 2015-12-17 DIAGNOSIS — Z7982 Long term (current) use of aspirin: Secondary | ICD-10-CM | POA: Diagnosis not present

## 2015-12-17 DIAGNOSIS — J690 Pneumonitis due to inhalation of food and vomit: Secondary | ICD-10-CM | POA: Diagnosis present

## 2015-12-17 LAB — COMPREHENSIVE METABOLIC PANEL
ALT: 13 U/L — ABNORMAL LOW (ref 14–54)
AST: 30 U/L (ref 15–41)
Albumin: 3.8 g/dL (ref 3.5–5.0)
Alkaline Phosphatase: 61 U/L (ref 38–126)
Anion gap: 5 (ref 5–15)
BUN: 18 mg/dL (ref 6–20)
CO2: 24 mmol/L (ref 22–32)
Calcium: 9.1 mg/dL (ref 8.9–10.3)
Chloride: 111 mmol/L (ref 101–111)
Creatinine, Ser: 0.7 mg/dL (ref 0.44–1.00)
GFR calc Af Amer: >60 mL/min (ref >60)
GFR calc non Af Amer: >60 mL/min (ref >60)
Glucose, Bld: 107 mg/dL — ABNORMAL HIGH (ref 65–99)
Potassium: 4 mmol/L (ref 3.5–5.1)
Sodium: 140 mmol/L (ref 135–145)
Total Bilirubin: 1.3 mg/dL — ABNORMAL HIGH (ref 0.3–1.2)
Total Protein: 6.1 g/dL — ABNORMAL LOW (ref 6.5–8.1)

## 2015-12-17 LAB — CBC
HCT: 36.3 % (ref 35.0–47.0)
Hemoglobin: 12.5 g/dL (ref 12.0–16.0)
MCH: 31.2 pg (ref 26.0–34.0)
MCHC: 34.5 g/dL (ref 32.0–36.0)
MCV: 90.5 fL (ref 80.0–100.0)
Platelets: 216 10*3/uL (ref 150–440)
RBC: 4.01 MIL/uL (ref 3.80–5.20)
RDW: 13.1 % (ref 11.5–14.5)
WBC: 7.6 10*3/uL (ref 3.6–11.0)

## 2015-12-17 MED ORDER — ACETAMINOPHEN 650 MG RE SUPP: 650.0000 mg | Freq: Four times a day (QID) | RECTAL | Status: DC | PRN

## 2015-12-17 MED ORDER — IPRATROPIUM-ALBUTEROL 0.5-2.5 (3) MG/3ML IN SOLN: 3.0000 mL | Freq: Four times a day (QID) | RESPIRATORY_TRACT | Status: DC | PRN

## 2015-12-17 MED ORDER — ACETAMINOPHEN 325 MG PO TABS: 650.0000 mg | ORAL_TABLET | Freq: Four times a day (QID) | ORAL | Status: DC | PRN

## 2015-12-17 MED ORDER — ENOXAPARIN SODIUM 40 MG/0.4ML ~~LOC~~ SOLN
40.0000 mg | SUBCUTANEOUS | Status: DC
  Administered 2015-12-18 – 2015-12-23 (×6): 40 mg via SUBCUTANEOUS
  Filled 2015-12-17 (×6): qty 0.4

## 2015-12-17 MED ORDER — CARBIDOPA-LEVODOPA ER 25-100 MG PO TBCR
1.0000 | EXTENDED_RELEASE_TABLET | Freq: Two times a day (BID) | ORAL | Status: DC
  Administered 2015-12-18 – 2015-12-24 (×13): 1 via ORAL
  Filled 2015-12-17 (×13): qty 1

## 2015-12-17 NOTE — Evaluation (Signed)
Clinical/Bedside Swallow Evaluation Patient Details  Name: [name] MRN: [id number] Date of Birth: 03/24/1923  Today's Date: 12/17/2015 Time: SLP Start Time (ACUTE ONLY): 1350 SLP Stop Time (ACUTE ONLY): 1450 SLP Time Calculation (min) (ACUTE ONLY): 60 min  Past Medical History:  Past Medical History  Diagnosis Date  . Anemia   . Arthritis   . Hypertension   . Tremor   . B12 deficiency    Past Surgical History:  Past Surgical History  Procedure Laterality Date  . Abdominal hysterectomy    . Joint replacement     HPI:  Pt is a 82 y.o. female has a past medical history significant for Parkinson's Dis., arthritis, and HTN who lives with her husband at home(noted husband in w/c requiring assistance from Dtr w/ meal setup). Has been having worsening issues with her Parkinson's and her meds have been adjusted recently. Presents now with worsening confusion and tremor, falling to the right. Slight cough. Has been eating normally at home. No fever. No change in bowels or bladder. W/u in ER including head CT non-diagnostic. Family unable to care for patient in this condition. She is now admitted. Pt presents w/ reduced articulation of speech; mumbled speech impacted by attention to task during speech. Some confusion when answering basic questions; oriented to self and family members in room. Followed basic commands w/ cues. Appeared weak w/ tremorous UE activity unable to feed self w/ full assistance.    Assessment / Plan / Recommendation Clinical Impression  Pt appeared to adequately tolerate trials of thin liquids and purees w/ no immediate, overt s/s of aspiration noted; no decline in respiratory status and vocal quality was clear post trials. Pt exhibited tremorous UE movements attempting to hold cup to drink and required support d/t the decreased strength and coordination; also given cues to attend to task of po trials/intake. When increased support was given and verbal cues to follow  aspiration precautions was given, pt tolerated po trials appropriately. Oral phase was c/b min. increased time needed for full bolus management and oral clearing w/ trial consistencies assessed. No solids were assessed at this eval sec. to pt's overall weakness and Cognitive status. Pt appears at min. risk for aspiration but w/ aspiration precautions and support during feeding including verbal cues to attend to task of po intake, pt be able to tolerate a Dys. 1 w/ thin liquids diet. Rec. initiation of diet w/ strict aspiration precautions; meds in puree and feeding at meals. Rec. meds for Parkinson's Dis. be given 30-45 mins. PRIOR to meals to allow benefit in the system for swallowing task. Pt would benefit from ST services for diet toleration and modification as necessary as part of her ADLs; family education on dysphagia and aspiration precautions.    Aspiration Risk  Mild aspiration risk    Diet Recommendation  Dys. 1 w/ thin liquids; aspiration precautions; feeding assistance w/ reduced distractions at meals  Medication Administration: Crushed with puree (as nec. )    Other  Recommendations Recommended Consults:  (Dietician; Palliative Care consult) Oral Care Recommendations: Oral care BID;Staff/trained caregiver to provide oral care   Follow up Recommendations  Skilled Nursing facility (TBD)    Frequency and Duration min 3x week  2 weeks       Prognosis Prognosis for Safe Diet Advancement: Fair Barriers to Reach Goals: Cognitive deficits;Severity of deficits      Swallow Study   General Date of Onset: 12/16/15 HPI: Pt is a 82 y.o. female has a past  medical history significant for Parkinson's Dis., arthritis, and HTN who lives with her husband at home(noted husband in w/c requiring assistance from Dtr w/ meal setup). Has been having worsening issues with her Parkinson's and her meds have been adjusted recently. Presents now with worsening confusion and tremor, falling to the right.  Slight cough. Has been eating normally at home. No fever. No change in bowels or bladder. W/u in ER including head CT non-diagnostic. Family unable to care for patient in this condition. She is now admitted. Pt presents w/ reduced articulation of speech; mumbled speech impacted by attention to task during speech. Some confusion when answering basic questions; oriented to self and family members in room. Followed basic commands w/ cues. Appeared weak w/ tremorous UE activity unable to feed self w/ full assistance.  Type of Study: Bedside Swallow Evaluation Previous Swallow Assessment: none reported Diet Prior to this Study: Regular;Thin liquids (per family) Temperature Spikes Noted: No (wbc 7.6) Respiratory Status: Room air (noted CXR) History of Recent Intubation: No Behavior/Cognition: Alert;Cooperative;Pleasant mood;Requires cueing (confusion; reduced articulation of speech) Oral Cavity Assessment: Dry Oral Care Completed by SLP: Recent completion by staff Oral Cavity - Dentition: Adequate natural dentition Vision:  (n/a sec. to UE tremors) Self-Feeding Abilities: Total assist Patient Positioning: Upright in bed Baseline Vocal Quality: Low vocal intensity (mumbled at times; reduced articulation ) Volitional Cough: Strong (fairly) Volitional Swallow: Able to elicit    Oral/Motor/Sensory Function Overall Oral Motor/Sensory Function: Mild impairment Facial ROM: Within Functional Limits Facial Symmetry: Within Functional Limits Lingual ROM: Within Functional Limits Lingual Symmetry: Within Functional Limits Lingual Strength: Reduced (reduced coordination)   Ice Chips Ice chips: Within functional limits Presentation: Spoon (fed; 3 trials)   Thin Liquid Thin Liquid: Impaired Presentation: Cup;Self Fed;Straw (fully assisted; 3-4 trials each) Oral Phase Impairments: Reduced labial seal;Poor awareness of bolus Oral Phase Functional Implications:  (none) Pharyngeal  Phase Impairments:   (none) Other Comments: required verbal cues to attend to task of taking po's    Nectar Thick Nectar Thick Liquid: Not tested   Honey Thick Honey Thick Liquid: Not tested   Puree Puree: Impaired Presentation: Spoon (fed; 6-7 trials accepted) Oral Phase Impairments: Reduced labial seal;Poor awareness of bolus (give cues) Oral Phase Functional Implications: Prolonged oral transit (min.) Pharyngeal Phase Impairments:  (none) Other Comments: reduced attention to task of taking po's   Solid   GO   Solid: Not tested       Jerilynn Som, MS, CCC-SLP  Watson,Katherine 12/17/2015,3:45 PM

## 2015-12-17 NOTE — Care Management Note (Signed)
Case Management Note  Patient Details  Name: Elmarie Mainlandttalene W Verry MRN: [id number] Date of Birth: 12-02-1922  Subjective/Objective:         Reviewed Medicare Outpatient Observation Notice form with Mrs Alver FisherShaw's daughters. Daughters want to speak with Dr Nemiah CommanderKalisetti about the Observation status because they think that Mrs Clelia CroftShaw may need rehab. This Clinical research associatewriter explained that Medicare only accepts certain diagnoses to qualify for Inpatient status, and that Mrs [facility]'s admitting diagnosis does not qualify for Inpatient. This Clinical research associatewriter also explained that the physicians cannot change a patient's diagnosis for convenience, but only as the patient's symptoms indicated.         Action/Plan:   Expected Discharge Date:                  Expected Discharge Plan:     In-House Referral:     Discharge planning Services     Post Acute Care Choice:    Choice offered to:     DME Arranged:    DME Agency:     HH Arranged:    HH Agency:     Status of Service:     Medicare Important Message Given:    Date Medicare IM Given:    Medicare IM give by:    Date Additional Medicare IM Given:    Additional Medicare Important Message give by:     If discussed at Long Length of Stay Meetings, dates discussed:    Additional Comments:  Nadiya Pieratt A, RN 12/17/2015, 10:32 AM

## 2015-12-17 NOTE — Care Management Note (Signed)
Case Management Note  Patient Details  Name: [name] MRN: [id number] Date of Birth: 1923-03-03  Subjective/Objective:     Tina [name]'s status has now been changed from Observation to Inpatient Status. Daughters were informed of this change. 82 yo Tina [name] was admitted 12/16/15 per AMS after her physician stopped her Cinemet XR medication for Parkinson's Disease several days ago. [name] has two daughters who are her caregivers. PCP=Dr [facility] [employer]. [facility]. [name] has a rollator, a rolling walker, and a shower chair at home. Daughters provide transportation. [name] currently has no home health services in the home. Her daughters are the primary caregivers for both [name] and her husband who also resides in the home. Case management will follow for discharge planning.               Action/Plan:   Expected Discharge Date:                  Expected Discharge Plan:     In-House Referral:     Discharge planning Services     Post Acute Care Choice:    Choice offered to:     DME Arranged:    DME Agency:     HH Arranged:    HH Agency:     Status of Service:     Medicare Important Message Given:    Date Medicare IM Given:    Medicare IM give by:    Date Additional Medicare IM Given:    Additional Medicare Important Message give by:     If discussed at Long Length of Stay Meetings, dates discussed:    Additional Comments:  Jearlene Bridwell A, RN 12/17/2015, 11:25 AM

## 2015-12-17 NOTE — Consult Note (Signed)
Referring Physician: Luberta Mutter    Chief Complaint: Altered mental status  HPI: [name] is an 82 y.o. female who is unable to provide any history today.  All history obtained from the daughters.  Patient has been followed by Dr. Malvin Johns for her presumed PD.  Has been on Sinemet IR and CR.  With no significant improvement it was decided that this would be discontinued and the family was in the process of tapering the dose.  It was fully discontinued within the past few weeks but they felt the patient was worsening and they restarted it at a low dose.  Over the past 3 months the patient has intermittently had difficulty with gait and been leaning to the right.  Over the past week she has been more confused at night.  She sleeps little and is active all night.    Previously patient able to get to a standing position without help and ambulating indpependently.  Now is requiring assistance for gait and transfers.    Date last known well: Unable to determine Time last known well: Unable to determine tPA Given: No: Unable to determine LKW  Past Medical History  Diagnosis Date  . Anemia   . Arthritis   . Hypertension   . Tremor   . B12 deficiency     Past Surgical History  Procedure Laterality Date  . Abdominal hysterectomy    . Joint replacement      Family History  Problem Relation Age of Onset  . Family history unknown: Yes   Social History:  reports that she has never smoked. She has never used smokeless tobacco. She reports that she does not drink alcohol or use illicit drugs.  Allergies:  Allergies  Allergen Reactions  . Phenergan [Promethazine Hcl] Other (See Comments)    Family states the patient almost went into a coma.  . Sulfa Antibiotics Other (See Comments)    Unknown reaction    Medications:  I have reviewed the patient's current medications. Prior to Admission:  Prescriptions prior to admission  Medication Sig Dispense Refill Last Dose  . aspirin EC 81 MG  tablet Take 81 mg by mouth daily.   12/16/2015 at Unknown time  . Calcium Carbonate (CALCIUM-CARB 600 PO) Take 1,200 mg by mouth every morning.   12/16/2015 at Unknown time  . carbidopa-levodopa (SINEMET IR) 25-100 MG tablet Take 1 tablet by mouth every morning. **Take along with extended release dose**   12/16/2015 at Unknown time  . Carbidopa-Levodopa ER (SINEMET CR) 25-100 MG tablet controlled release Take 1 tablet by mouth every morning. **Take along with immediate release tab**   12/16/2015 at Unknown time  . cyanocobalamin (,VITAMIN B-12,) 1000 MCG/ML injection injected into the muscle 1x per week for 4 weeks, then monthly for 6 months. 30 mL 0 12/15/2015  . metoprolol succinate (TOPROL-XL) 50 MG 24 hr tablet TAKE 1 TABLET BY MOUTH ONCE EVERY DAY 30 tablet 3 12/16/2015 at 0900  . Multiple Vitamins-Minerals (MULTIVITAMIN ADULT PO) Take 1 tablet by mouth daily.    12/16/2015 at Unknown time   Scheduled: . aspirin EC  81 mg Oral Daily  . [START ON 12/18/2015] Carbidopa-Levodopa ER  1 tablet Oral BID  . docusate sodium  100 mg Oral BID  . heparin  5,000 Units Subcutaneous 3 times per day  . ipratropium-albuterol  3 mL Nebulization QID  . levofloxacin (LEVAQUIN) IV  250 mg Intravenous Q24H  . metoprolol succinate  50 mg Oral Daily  ROS: Patient unable to provide due to mental status  Physical Examination: Blood pressure 159/103, pulse 96, temperature 97.7 F (36.5 C), temperature source Oral, resp. rate 20, height 5\' 1"  (1.549 m), weight 45.178 kg (99 lb 9.6 oz), SpO2 95 %.  HEENT-  Normocephalic, no lesions, without obvious abnormality.  Normal external eye and conjunctiva.  Normal TM's bilaterally.  Normal auditory canals and external ears. Normal external nose, mucus membranes and septum.  Normal pharynx. Cardiovascular- S1, S2 normal, pulses palpable throughout   Lungs- chest clear, no wheezing, rales, normal symmetric air entry Abdomen- soft, non-tender; bowel sounds normal; no  masses,  no organomegaly Extremities- no edema Lymph-no adenopathy palpable Musculoskeletal-no joint tenderness, deformity or swelling Skin-warm and dry, no hyperpigmentation, vitiligo, or suspicious lesions  Neurological Examination Mental Status: Eyes closed.  Lethargic.  Does not follow commands.   Cranial Nerves: II: Discs flat bilaterally; Does not blink to bilateral confrontation, pupils equal, round, reactive to light and accommodation III,IV, VI: ptosis not present, intact oculocephalic maneuvers V,VII: decreased left corneal response, facial light touch sensation normal bilaterally VIII: unable to test IX,X: unable to test XI: unable to test XII: unable to test Motor: Patient moves all extremities with no focal weakness noted.  Tremor noted in all extremities.  Head tremor noted as well.   Sensory: Responds to noxious stimuli throughout.   Deep Tendon Reflexes: 2+ in the upper extremities, 1+ at the knees and absent at the ankles.   Plantars: Right: downgoing   Left: downgoing Cerebellar: Unable to perform Gait: Unable to perform    Laboratory Studies:  Basic Metabolic Panel:  Recent Labs Lab 12/12/15 1131 12/16/15 1419 12/17/15 0509  NA 140 139 140  K 4.3 3.7 4.0  CL 101 106 111  CO2 25 27 24   GLUCOSE 82 98 107*  BUN 21 22* 18  CREATININE 0.76 0.58 0.70  CALCIUM 9.5 9.6 9.1    Liver Function Tests:  Recent Labs Lab 12/16/15 1419 12/17/15 0509  AST 25 30  ALT <5* 13*  ALKPHOS 66 61  BILITOT 0.9 1.3*  PROT 6.8 6.1*  ALBUMIN 4.1 3.8   No results for input(s): LIPASE, AMYLASE in the last 168 hours. No results for input(s): AMMONIA in the last 168 hours.  CBC:  Recent Labs Lab 12/16/15 1419 12/17/15 0509  WBC 8.2 7.6  HGB 13.1 12.5  HCT 37.8 36.3  MCV 90.0 90.5  PLT 227 216    Cardiac Enzymes:  Recent Labs Lab 12/16/15 1419  TROPONINI <0.03    BNP: Invalid input(s): POCBNP  CBG: No results for input(s): GLUCAP in the last 168  hours.  Microbiology: Results for orders placed or performed in visit on 12/12/15  Microscopic Examination     Status: None   Collection Time: 12/12/15 11:10 AM  Result Value Ref Range Status   WBC, UA 0-5 0 -  5 /hpf Final   RBC, UA 0-2 0 -  2 /hpf Final   Epithelial Cells (non renal) 0-10 0 - 10 /hpf Final   Mucus, UA Present Not Estab. Final   Bacteria, UA Few None seen/Few Final  Urine culture     Status: None   Collection Time: 12/12/15 11:31 AM  Result Value Ref Range Status   Urine Culture, Routine Final report  Final   Urine Culture result 1 Comment  Final    Comment: Culture shows less than 10,000 colony forming units of bacteria per milliliter of urine. This colony count is not generally  considered to be clinically significant.     Coagulation Studies: No results for input(s): LABPROT, INR in the last 72 hours.  Urinalysis:  Recent Labs Lab 12/12/15 1110 12/16/15 1419  COLORURINE  --  YELLOW*  LABSPEC  --  1.019  PHURINE  --  6.0  GLUCOSEU Negative NEGATIVE  HGBUR  --  NEGATIVE  BILIRUBINUR Negative NEGATIVE  KETONESUR  --  TRACE*  PROTEINUR Negative NEGATIVE  NITRITE Negative NEGATIVE  LEUKOCYTESUR 1+* NEGATIVE    Lipid Panel: No results found for: CHOL, TRIG, HDL, CHOLHDL, VLDL, LDLCALC  HgbA1C: No results found for: HGBA1C  Urine Drug Screen:  No results found for: LABOPIA, COCAINSCRNUR, LABBENZ, AMPHETMU, THCU, LABBARB  Alcohol Level: No results for input(s): ETH in the last 168 hours.  Other results: EKG: sinus rhythm at 58 bpm.  Imaging: Ct Head Wo Contrast  12/16/2015  CLINICAL DATA:  Generalized weakness.  Confusion.  Parkinson's. EXAM: CT HEAD WITHOUT CONTRAST TECHNIQUE: Contiguous axial images were obtained from the base of the skull through the vertex without intravenous contrast. COMPARISON:  None. FINDINGS: Generalized atrophy, moderate in degree. Ventricular enlargement consistent with atrophy. Mild chronic microvascular ischemic change  in the white matter. No acute infarct. Negative for hemorrhage or mass lesion. No edema or shift of the midline structures. Normal calvarium. Mild mucosal edema in the right sphenoid sinus. Mild vascular calcification IMPRESSION: Atrophy and mild chronic microvascular ischemia. No acute abnormality Electronically Signed   By: Marlan Palauharles  Clark M.D.   On: 12/16/2015 14:58   Ct Chest W Contrast  12/16/2015  CLINICAL DATA:  Possible right hilar mass on a portable chest earlier today. EXAM: CT CHEST WITH CONTRAST TECHNIQUE: Multidetector CT imaging of the chest was performed during intravenous contrast administration. CONTRAST:  50mL OMNIPAQUE IOHEXOL 300 MG/ML  SOLN COMPARISON:  Previous chest radiographs, including earlier today. FINDINGS: Mediastinum/Lymph Nodes: Pericardial effusion with a maximum thickness of 9 mm. Dense atheromatous coronary artery calcifications. The recently suspected right hilar mass is due to right pulmonary artery prominence. No true mass or enlarged lymph nodes seen. Lungs/Pleura: Mild patchy opacity in the posterior aspect of the right lower lobe. Minimal patchy opacity in the left lower lobe. Small amount of linear scarring or atelectasis in both lower lobes. No pleural fluid. Upper abdomen: 3.7 cm splenic cyst. Mild scarring of the upper pole of the left kidney. Musculoskeletal: Partially healed right posterior seventh, eighth, ninth and tenth rib fractures. Healed right lateral and posterolateral fifth, sixth, seventh, eighth and ninth rib fractures. Mild thoracic spine degenerative changes. IMPRESSION: 1. The recently suspected right hilar mass is a prominent right pulmonary artery, suggesting pulmonary arterial hypertension. 2. Mild patchy opacity in the right lower lobe and minimal patchy opacity in the left lower lobe. This could represent pneumonia or patchy atelectasis. 3. Pericardial effusion with a maximum thickness of 9 mm. Electronically Signed   By: Beckie SaltsSteven  Reid M.D.   On:  12/16/2015 16:48   Dg Chest Portable 1 View  12/16/2015  CLINICAL DATA:  Worsening generalized weakness and increased confusion over the past week. EXAM: PORTABLE CHEST 1 VIEW COMPARISON:  Previous examinations, the most recent dated 10/29/2015. FINDINGS: Stable enlarged cardiac silhouette. The right hilum has a prominent, oval configuration laterally. This is asymmetrical compared to the left hilum. Clear lungs. Minimally prominent interstitial markings. No pleural fluid. Diffuse osteopenia. IMPRESSION: 1. Possible right hilar mass. Further evaluation with PA and lateral chest radiographs is recommended when patient is able. 2. Stable cardiomegaly and minimal chronic  interstitial lung disease. Electronically Signed   By: Beckie Salts M.D.   On: 12/16/2015 15:09    Assessment: 82 y.o. female presenting with a precipitous decline.  History of PD with recent adjustment in medications.  Decrease in mental status though is atypical for changes from PD alone.  Patient with bilateral PNA noted on chest CT which may be contributing to presentation.  Antibiotics initiated.  MRI of the brain personally reviewed and shows no acute changes.    Stroke Risk Factors - hypertension  Plan: 1. Agree with antibiotics 2. Discontinue Sinemet IR 3. Sinemet CR 25/100 BID 4. Will continue to follow with you.     Thana Farr, MD Neurology [phone] 12/17/2015, 12:26 PM

## 2015-12-17 NOTE — Evaluation (Signed)
Physical Therapy Evaluation Patient Details Name: [name] MRN: [id number] DOB: 10-22-1922 Today's Date: 12/17/2015   History of Present Illness  [name] is a 82 y.o. female has a past medical history significant for Parkinson's and HTN who lives with her husband at home. Has been having worsening issues with her Parkinson's and her meds have been adjusted recently. Presents now with worsening confusion and tremor, falling to the right. Slight cough. Has been eating normally at home. No fever. No change in bowels or bladder. W/u in ER including head CT non-diagnostic. Family unable to care for patient in this condition. She is now admitted. Previously pt was very independent. She did suffer 1 fall in the last 12 months. Occurred in January 2017 with 3 R sided rib fractures. History obtained from daughters as pt is AOx1 for first name only at time of evaluation  Clinical Impression  Pt with severe tremors during PT evaluation in bilateral UE/LE. She is AOx1 for first name only. Unable to provide DOB or other meaningful information however she is very pleasant and polite. Family present and provides all information. Exam is somewhat limited due to AMS with poor command follow. Pt requiring maxA+1 for all bed mobility. She is unable to maintain static sitting balance and continually falls posterior and to the R in sitting. Unable to attempt standing or ambulation due to poor sitting balance and safety concerns. Pt will need SNF placement at discharge in order to facilitate safe return to prior level of function at home. Pt will benefit from skilled PT services to address deficits in strength, balance, and mobility in order to return to full function at home.     Follow Up Recommendations SNF    Equipment Recommendations  None recommended by PT    Recommendations for Other Services       Precautions / Restrictions Precautions Precautions: Fall Restrictions Weight Bearing  Restrictions: No      Mobility  Bed Mobility Overal bed mobility: Needs Assistance Bed Mobility: Supine to Sit;Sit to Supine     Supine to sit: Max assist;HOB elevated (+2 to scoot up toward HOB) Sit to supine: Max assist   General bed mobility comments: Pt not consistently following commands. She is very confused currently and extremely tremulous. Severe bilateral UE/LE tremors noted. Pt requires maxA+1 to go from supine to sitting upright at EOB. Once upright she is unable to maintain static sitting balance. Pt requires modA+1 to remain upright. Able to perform some anterior reaching but unable to maintain balance or shift weight anteriorly  Transfers                 General transfer comment: Unable/unsafe to attempt at this time due to poor static sitting balance. Unable to attempt ambulation  Ambulation/Gait                Stairs            Wheelchair Mobility    Modified Rankin (Stroke Patients Only)       Balance Overall balance assessment: Needs assistance Sitting-balance support: Bilateral upper extremity supported;Feet supported Sitting balance-Leahy Scale: Poor Sitting balance - Comments: Pt with very poor static sitting balance. Requires modA+1 to remain upright position due to LOB to the R and posterior. Cues for anterior weight shifting with reaching. Pt able to reach but unable to shift weight forward adequately to maintain balance.  Pertinent Vitals/Pain Pain Assessment: Faces Faces Pain Scale: No hurt    Home Living Family/patient expects to be discharged to:: Private residence Living Arrangements: Spouse/significant other Available Help at Discharge: Family;Available 24 hours/day (24/7 assist since January) Type of Home: House Home Access: Ramped entrance     Home Layout: One level Home Equipment: Emergency planning/management officer - 2 wheels;Walker - 4 wheels;Cane - single point;Bedside  commode;Grab bars - tub/shower;Wheelchair - manual      Prior Function Level of Independence: Needs assistance   Gait / Transfers Assistance Needed: No assistive device for household ambulation and limited community ambulation   ADL's / Homemaking Assistance Needed: Assist with bathing intermittently for additional safety but per family "able to do on her own."        Hand Dominance        Extremity/Trunk Assessment   Upper Extremity Assessment: Generalized weakness;Difficult to assess due to impaired cognition           Lower Extremity Assessment: Generalized weakness;Difficult to assess due to impaired cognition         Communication   Communication: No difficulties  Cognition Arousal/Alertness: Lethargic Behavior During Therapy: Flat affect Overall Cognitive Status: Impaired/Different from baseline Area of Impairment: Orientation Orientation Level: Disoriented to;Place;Time;Situation (Only able to provide first name. Unable to provide DOB)                  General Comments      Exercises        Assessment/Plan    PT Assessment Patient needs continued PT services  PT Diagnosis Difficulty walking;Generalized weakness;Altered mental status   PT Problem List Decreased strength;Decreased activity tolerance;Decreased balance;Decreased mobility;Decreased cognition;Decreased knowledge of use of DME;Decreased safety awareness  PT Treatment Interventions DME instruction;Gait training;Functional mobility training;Therapeutic activities;Therapeutic exercise;Balance training;Neuromuscular re-education;Cognitive remediation;Patient/family education   PT Goals (Current goals can be found in the Care Plan section) Acute Rehab PT Goals Patient Stated Goal: family would like patient to return to prior function PT Goal Formulation: With family (Patient unable to participate) Time For Goal Achievement: 12/31/15 Potential to Achieve Goals: Fair    Frequency Min  2X/week   Barriers to discharge        Co-evaluation               End of Session   Activity Tolerance: Other (comment) (Limited by confusion) Patient left: in bed;with call bell/phone within reach;with bed alarm set;with family/visitor present Nurse Communication: Mobility status;Other (comment) (No marker in room. RN notified bedpan for toileting)    Functional Assessment Tool Used: clinical judgement Functional Limitation: Mobility: Walking and moving around Mobility: Walking and Moving Around Current Status [phone]): At least 80 percent but less than 100 percent impaired, limited or restricted Mobility: Walking and Moving Around Goal Status [phone]): At least 20 percent but less than 40 percent impaired, limited or restricted    Time: 0923-0939 PT Time Calculation (min) (ACUTE ONLY): 16 min   Charges:   PT Evaluation $PT Eval Moderate Complexity: 1 Procedure     PT G Codes:   PT G-Codes **NOT FOR INPATIENT CLASS** Functional Assessment Tool Used: clinical judgement Functional Limitation: Mobility: Walking and moving around Mobility: Walking and Moving Around Current Status ([id number]): At least 80 percent but less than 100 percent impaired, limited or restricted Mobility: Walking and Moving Around Goal Status [phone]): At least 20 percent but less than 40 percent impaired, limited or restricted   [name] PT, DPT   [name] 12/17/2015, 10:29 AM

## 2015-12-17 NOTE — Progress Notes (Signed)
Notifed Dr. Luberta MutterKonidena about pt family concern regarding pt having episode of more tremors than usual. Pt VSS and pt went back to sleep. Pt resting, family concerns addressed. Follow current medication list as ordered. Continue to assess.

## 2015-12-17 NOTE — Progress Notes (Signed)
[facility] [place] [facility]   PATIENT NAME: [name]    MR#:  [id number]  DATE OF BIRTH:  1922/12/08  SUBJECTIVE: Patient is admitted because of worsening confusion, poor by mouth intake, avoiding worsening tremors. No fever at home. Gradual l functional decline noted since 1 week according to the family. For that patient was very independent with her eating, walking as well. Recently the family is adjusting her medications for Parkinson for the past 7 weeks.   CHIEF COMPLAINT:   Chief Complaint  Patient presents with  . Altered Mental Status  . Weakness    REVIEW OF SYSTEMS:    ROS unable to obtain because of lethargy.   Nutrition:npo Tolerating Diet: Tolerating PT:      DRUG ALLERGIES:   Allergies  Allergen Reactions  . Phenergan [Promethazine Hcl] Other (See Comments)    Family states the patient almost went into a coma.  . Sulfa Antibiotics Other (See Comments)    Unknown reaction    VITALS:  Blood pressure 159/103, pulse 96, temperature 97.7 F (36.5 C), temperature source Oral, resp. rate 20, height 5\' 1"  (1.549 m), weight 45.178 kg (99 lb 9.6 oz), SpO2 95 %.  PHYSICAL EXAMINATION:   Physical Exam  GENERAL:  82 y.o.-year-old patient lying in the bed with no acute distress.  EYES: Pupils equal, round, reactive to light and accommodation. No scleral icterus. Extraocular muscles intact.  HEENT: Head atraumatic, normocephalic. Oropharynx and nasopharynx clear.  NECK:  Supple, no jugular venous distention. No thyroid enlargement, no tenderness.  LUNGS: Normal breath sounds bilaterally, no wheezing, rales,rhonchi or crepitation. No use of accessory muscles of respiration.  CARDIOVASCULAR: S1, S2 normal. No murmurs, rubs, or gallops.  ABDOMEN: Soft, nontender, nondistended. Bowel sounds present. No organomegaly or mass.  EXTREMITIES: No pedal edema, cyanosis, or clubbing.  NEUROLOGIC:Not able to follow neurological exam she is  lethargic. Continues tremors noted in hands. Psychiatric./: The patient is  Lethargic. SKIN: No obvious rash, lesion, or ulcer.    LABORATORY PANEL:   CBC  Recent Labs Lab 12/17/15 0509  WBC 7.6  HGB 12.5  HCT 36.3  PLT 216   ------------------------------------------------------------------------------------------------------------------  Chemistries   Recent Labs Lab 12/17/15 0509  NA 140  K 4.0  CL 111  CO2 24  GLUCOSE 107*  BUN 18  CREATININE 0.70  CALCIUM 9.1  AST 30  ALT 13*  ALKPHOS 61  BILITOT 1.3*   ------------------------------------------------------------------------------------------------------------------  Cardiac Enzymes  Recent Labs Lab 12/16/15 1419  TROPONINI <0.03   ------------------------------------------------------------------------------------------------------------------  RADIOLOGY:  Ct Head Wo Contrast  12/16/2015  CLINICAL DATA:  Generalized weakness.  Confusion.  Parkinson's. EXAM: CT HEAD WITHOUT CONTRAST TECHNIQUE: Contiguous axial images were obtained from the base of the skull through the vertex without intravenous contrast. COMPARISON:  None. FINDINGS: Generalized atrophy, moderate in degree. Ventricular enlargement consistent with atrophy. Mild chronic microvascular ischemic change in the white matter. No acute infarct. Negative for hemorrhage or mass lesion. No edema or shift of the midline structures. Normal calvarium. Mild mucosal edema in the right sphenoid sinus. Mild vascular calcification IMPRESSION: Atrophy and mild chronic microvascular ischemia. No acute abnormality Electronically Signed   By: Marlan Palauharles  Clark M.D.   On: 12/16/2015 14:58   Ct Chest W Contrast  12/16/2015  CLINICAL DATA:  Possible right hilar mass on a portable chest earlier today. EXAM: CT CHEST WITH CONTRAST TECHNIQUE: Multidetector CT imaging of the chest was performed during intravenous contrast administration. CONTRAST:  50mL OMNIPAQUE  IOHEXOL 300  MG/ML  SOLN COMPARISON:  Previous chest radiographs, including earlier today. FINDINGS: Mediastinum/Lymph Nodes: Pericardial effusion with a maximum thickness of 9 mm. Dense atheromatous coronary artery calcifications. The recently suspected right hilar mass is due to right pulmonary artery prominence. No true mass or enlarged lymph nodes seen. Lungs/Pleura: Mild patchy opacity in the posterior aspect of the right lower lobe. Minimal patchy opacity in the left lower lobe. Small amount of linear scarring or atelectasis in both lower lobes. No pleural fluid. Upper abdomen: 3.7 cm splenic cyst. Mild scarring of the upper pole of the left kidney. Musculoskeletal: Partially healed right posterior seventh, eighth, ninth and tenth rib fractures. Healed right lateral and posterolateral fifth, sixth, seventh, eighth and ninth rib fractures. Mild thoracic spine degenerative changes. IMPRESSION: 1. The recently suspected right hilar mass is a prominent right pulmonary artery, suggesting pulmonary arterial hypertension. 2. Mild patchy opacity in the right lower lobe and minimal patchy opacity in the left lower lobe. This could represent pneumonia or patchy atelectasis. 3. Pericardial effusion with a maximum thickness of 9 mm. Electronically Signed   By: Beckie Salts M.D.   On: 12/16/2015 16:48   Dg Chest Portable 1 View  12/16/2015  CLINICAL DATA:  Worsening generalized weakness and increased confusion over the past week. EXAM: PORTABLE CHEST 1 VIEW COMPARISON:  Previous examinations, the most recent dated 10/29/2015. FINDINGS: Stable enlarged cardiac silhouette. The right hilum has a prominent, oval configuration laterally. This is asymmetrical compared to the left hilum. Clear lungs. Minimally prominent interstitial markings. No pleural fluid. Diffuse osteopenia. IMPRESSION: 1. Possible right hilar mass. Further evaluation with PA and lateral chest radiographs is recommended when patient is able. 2. Stable cardiomegaly and  minimal chronic interstitial lung disease. Electronically Signed   By: Beckie Salts M.D.   On: 12/16/2015 15:09     ASSESSMENT AND PLAN:   Principal Problem:   Altered mental status Active Problems:   Parkinson's disease (HCC)   Dysphagia   Abnormal CXR   1.altered mental status likely secondary to developing pneumonia: vs medication related; Continue antibiotics, IV hydration.  2.Metabolic encephalopathy likely secondary to developing pneumonia: Continue antibiotics.  #3 worsening confusion unable to exclude underlying stroke: Patient CT head unremarkable. Seen by neurology, we will get MRI of the brain. #4 . severe Parkinson's and dementia: Patient is on Sinemet. Follows up with Dr. Malvin Johns. Medications Sinemet is being adjusted. appreciate neurology following.   Dysphagia ; continue nothing by mouth, speech therapy consult.    discussed patient's daughters. More than 20 minutes are spent in counseling and coordination of care in addition to 20 minutes.   All the records are reviewed and case discussed with Care Management/Social Workerr. Management plans discussed with the patient, family and they are in agreement.  CODE STATUS: full  TOTAL TIME TAKING CARE OF THIS PATIENT: 20 minutes.   POSSIBLE D/C IN 1-2  DAYS, DEPENDING ON CLINICAL CONDITION.   Katha Hamming M.D on 12/17/2015 at 10:31 AM  Between 7am to 6pm - Pager - [phone]  After 6pm go to [email or web address] - password [email or web address] [facility]  [facility]  Office  [phone]  CC: Primary care physician; Vonita Moss, MD

## 2015-12-17 NOTE — Clinical Social Work Note (Signed)
Clinical Social Work Assessment  Patient Details  Name: [name] MRN: [id number] Date of Birth: [birth year]  Date of referral:  12/17/15               Reason for consult:  Facility Placement                Permission sought to share information with:    Permission granted to share information::     Name::        Agency::     Relationship::     Contact Information:     Housing/Transportation Living arrangements for the past 2 months:  Single Family Home Source of Information:  Adult Children Patient Interpreter Needed:  None Criminal Activity/Legal Involvement Pertinent to Current Situation/Hospitalization:  No - Comment as needed Significant Relationships:  Adult Children, Spouse Lives with:  Spouse Do you feel safe going back to the place where you live?    Need for family participation in patient care:  Yes (Comment)  Care giving concerns:  Patient resides with her husband at home.   Social Worker assessment / plan:  Patient and husband resides together and patient's daughters assist in their care. Patient was ambulatory and independent until last week when she developed a quickening decline and increased tremors. Patient laying in bed this morning and unable to communicate or move. Patient very tremulous. Patient's two daughters: [name] and [name] were in with patient. They stated that PT"s recommendation has come as a shock to them and that they had believed that the MD would "fix" whatever had caused patient to decline so quickly and she would be able to return home. Patient's daughters stated that they would prefer to wait for rehab to be pursued and inquired about home health. CSW answered questions that they had and will hold off on beginning a bedsearch at this time.   Employment status:  Retired Database administratornsurance information:  Managed Medicare PT Recommendations:  Skilled Nursing Facility Information / Referral to community resources:     Patient/Family's Response to care:   Patient's daughters expressed appreciation for CSW visit and guidance regarding answering questions they had.  Patient/Family's Understanding of and Emotional Response to Diagnosis, Current Treatment, and Prognosis:  Patient's daughters are currently concerned at how well patient is going to bounce back from this illness and are wanting some time to process.  Emotional Assessment Appearance:  Appears stated age Attitude/Demeanor/Rapport:   (patient unable to converse or respond) Affect (typically observed):  Unable to Assess Orientation:  Fluctuating Orientation (Suspected and/or reported Sundowners) Alcohol / Substance use:  Not Applicable Psych involvement (Current and /or in the community):  No (Comment)  Discharge Needs  Concerns to be addressed:  Care Coordination Readmission within the last 30 days:  No Current discharge risk:  None Barriers to Discharge:  No Barriers Identified   York SpanielMonica Jameika Kinn, LCSW 12/17/2015, 1:04 PM

## 2015-12-18 MED ORDER — HYDRALAZINE HCL 25 MG PO TABS
25.0000 mg | ORAL_TABLET | Freq: Three times a day (TID) | ORAL | Status: DC
  Administered 2015-12-18 – 2015-12-22 (×13): 25 mg via ORAL
  Filled 2015-12-18 (×14): qty 1

## 2015-12-18 MED ORDER — METOPROLOL SUCCINATE ER 25 MG PO TB24
25.0000 mg | ORAL_TABLET | Freq: Every day | ORAL | Status: DC
  Administered 2015-12-19 – 2015-12-24 (×6): 25 mg via ORAL
  Filled 2015-12-18 (×6): qty 1

## 2015-12-18 NOTE — Care Management (Signed)
Met with patient and daughter [name] who is at bedside.  Patient is resting comfortably at this time.  Complete assessment documented by previous RNCM.  Per [name] prior to being sick patient was active, and able to perform ADL's. Family is still open to the option of rehab at discharge pending patients progression with PT.  [name] states that she would like to check [facility], [facility] in [place], and [facility].  Will also provide [name] list of Home Health Agencies.    

## 2015-12-18 NOTE — Progress Notes (Signed)
Speech Therapy note: reviewed chart notes; met w/ Dtr who reported was tolerating her current pureed diet w/ thin liquids - stated pt ate a "good" lunch eating over half of the meal. Pt is currently sleeping and SLP did not want to awaken. Will f/u w/ toleration of diet; trials to upgrade possibly tomorrow. Dtr agreed.

## 2015-12-18 NOTE — Plan of Care (Signed)
Problem: SLP Dysphagia Goals Goal: Misc Dysphagia Goal Pt will safely tolerate po diet of least restrictive consistency w/ no overt s/s of aspiration noted by Staff/pt/family x3 sessions.    

## 2015-12-18 NOTE — Progress Notes (Signed)
[facility] - [place] at [facility]   PATIENT NAME: [name]    MR#:  [id number]  DATE OF BIRTH:  [birth year]  SUBJECTIVE: Patient is admitted because of worsening confusion, poor by mouth intake, and worsening tremors. No fever at home. Gradual l functional decline noted since 1 week according to the family. before that patient was very independent with her eating, walking as well. More alert and awake today. Able to follow some commands.   CHIEF COMPLAINT:   Chief Complaint  Patient presents with  . Altered Mental Status  . Weakness   Admitted for pneumonia. Patient is aseen by neurology. MRI of the brain negative for acute stroke.    REVIEW OF SYSTEMS:    Review of Systems  Constitutional: Positive for fever. Negative for chills.  HENT: Negative for hearing loss.   Eyes: Negative for blurred vision, double vision and photophobia.  Respiratory: Negative for cough, hemoptysis and shortness of breath.   Cardiovascular: Negative for palpitations, orthopnea and leg swelling.  Gastrointestinal: Negative for vomiting, abdominal pain and diarrhea.  Genitourinary: Negative for dysuria and urgency.  Musculoskeletal: Negative for myalgias and neck pain.  Skin: Negative for rash.  Neurological: Positive for sensory change. Negative for dizziness, focal weakness, seizures, weakness and headaches.  Psychiatric/Behavioral: Negative for memory loss. The patient does not have insomnia.     Nutrition:npo Tolerating Diet: Tolerating PT:      DRUG ALLERGIES:   Allergies  Allergen Reactions  . Phenergan [Promethazine Hcl] Other (See Comments)    Family states the patient almost went into a coma.  . Sulfa Antibiotics Other (See Comments)    Unknown reaction    VITALS:  Blood pressure 130/88, pulse 58, temperature 98 F (36.7 C), temperature source Axillary, resp. rate 16, height  (1.549 m), weight 45.405 kg (100 lb 1.6 oz), SpO2 96 %.  PHYSICAL  EXAMINATION:   Physical Exam  GENERAL:  82 y.o.-year-old patient lying in the bed with no acute distress.  EYES: Pupils equal, round, reactive to light and accommodation. No scleral icterus. Extraocular muscles intact.  HEENT: Head atraumatic, normocephalic. Oropharynx and nasopharynx clear.  NECK:  Supple, no jugular venous distention. No thyroid enlargement, no tenderness.  LUNGS: Normal breath sounds bilaterally, no wheezing, rales,rhonchi or crepitation. No use of accessory muscles of respiration.  CARDIOVASCULAR: S1, S2 normal. No murmurs, rubs, or gallops.  ABDOMEN: Soft, nontender, nondistended. Bowel sounds present. No organomegaly or mass.  EXTREMITIES: No pedal edema, cyanosis, or clubbing.  NEUROLOGIC: Patient has no focal neurological deficit.. Continues tremors noted in hands. Psychiatric./: The patient is alert and awake. N: No obvious rash, lesion, or ulcer.    LABORATORY PANEL:   CBC  Recent Labs Lab 12/17/15 0509  WBC 7.6  HGB 12.5  HCT 36.3  PLT 216   ------------------------------------------------------------------------------------------------------------------  Chemistries   Recent Labs Lab 12/17/15 0509  NA 140  K 4.0  CL 111  CO2 24  GLUCOSE 107*  BUN 18  CREATININE 0.70  CALCIUM 9.1  AST 30  ALT 13*  ALKPHOS 61  BILITOT 1.3*   ------------------------------------------------------------------------------------------------------------------  Cardiac Enzymes  Recent Labs Lab 12/16/15 1419  TROPONINI <0.03   ------------------------------------------------------------------------------------------------------------------  RADIOLOGY:  Ct Head Wo Contrast  12/16/2015  CLINICAL DATA:  Generalized weakness.  Confusion.  Parkinson's. EXAM: CT HEAD WITHOUT CONTRAST TECHNIQUE: Contiguous axial images were obtained from the base of the skull through the vertex without intravenous contrast. COMPARISON:  None. FINDINGS: Generalized atrophy,  moderate in  degree. Ventricular enlargement consistent with atrophy. Mild chronic microvascular ischemic change in the white matter. No acute infarct. Negative for hemorrhage or mass lesion. No edema or shift of the midline structures. Normal calvarium. Mild mucosal edema in the right sphenoid sinus. Mild vascular calcification IMPRESSION: Atrophy and mild chronic microvascular ischemia. No acute abnormality Electronically Signed   By: Marlan Palau M.D.   On: 12/16/2015 14:58   Ct Chest W Contrast  12/16/2015  CLINICAL DATA:  Possible right hilar mass on a portable chest earlier today. EXAM: CT CHEST WITH CONTRAST TECHNIQUE: Multidetector CT imaging of the chest was performed during intravenous contrast administration. CONTRAST:  50mL OMNIPAQUE IOHEXOL 300 MG/ML  SOLN COMPARISON:  Previous chest radiographs, including earlier today. FINDINGS: Mediastinum/Lymph Nodes: Pericardial effusion with a maximum thickness of 9 mm. Dense atheromatous coronary artery calcifications. The recently suspected right hilar mass is due to right pulmonary artery prominence. No true mass or enlarged lymph nodes seen. Lungs/Pleura: Mild patchy opacity in the posterior aspect of the right lower lobe. Minimal patchy opacity in the left lower lobe. Small amount of linear scarring or atelectasis in both lower lobes. No pleural fluid. Upper abdomen: 3.7 cm splenic cyst. Mild scarring of the upper pole of the left kidney. Musculoskeletal: Partially healed right posterior seventh, eighth, ninth and tenth rib fractures. Healed right lateral and posterolateral fifth, sixth, seventh, eighth and ninth rib fractures. Mild thoracic spine degenerative changes. IMPRESSION: 1. The recently suspected right hilar mass is a prominent right pulmonary artery, suggesting pulmonary arterial hypertension. 2. Mild patchy opacity in the right lower lobe and minimal patchy opacity in the left lower lobe. This could represent pneumonia or patchy atelectasis.  3. Pericardial effusion with a maximum thickness of 9 mm. Electronically Signed   By: Beckie Salts M.D.   On: 12/16/2015 16:48   Mr Brain Wo Contrast  12/17/2015  CLINICAL DATA:  Parkinson's disease. Worsening confusion and tremor. EXAM: MRI HEAD WITHOUT CONTRAST TECHNIQUE: Multiplanar, multiecho pulse sequences of the brain and surrounding structures were obtained without intravenous contrast. COMPARISON:  Head CT 12/16/2015 FINDINGS: The study suffers from motion degradation. There chronic small-vessel ischemic changes affecting the pons. No focal cerebellar abnormality. There are moderate chronic small-vessel ischemic changes affecting the cerebral hemispheric deep and subcortical white matter. No cortical or large vessel territory infarction. Diffusion imaging does not show any acute or subacute infarction. No mass lesion, hemorrhage, hydrocephalus or extra-axial collection. No pituitary mass. Mild edema in the ethmoid and sphenoid sinus. Major vessels at the base of the brain show flow. IMPRESSION: No acute or reversible finding. Moderate chronic small-vessel ischemic changes throughout the brain, fairly typical for age. Electronically Signed   By: Paulina Fusi M.D.   On: 12/17/2015 12:42   Dg Chest Portable 1 View  12/16/2015  CLINICAL DATA:  Worsening generalized weakness and increased confusion over the past week. EXAM: PORTABLE CHEST 1 VIEW COMPARISON:  Previous examinations, the most recent dated 10/29/2015. FINDINGS: Stable enlarged cardiac silhouette. The right hilum has a prominent, oval configuration laterally. This is asymmetrical compared to the left hilum. Clear lungs. Minimally prominent interstitial markings. No pleural fluid. Diffuse osteopenia. IMPRESSION: 1. Possible right hilar mass. Further evaluation with PA and lateral chest radiographs is recommended when patient is able. 2. Stable cardiomegaly and minimal chronic interstitial lung disease. Electronically Signed   By: Beckie Salts  M.D.   On: 12/16/2015 15:09     ASSESSMENT AND PLAN:   Principal Problem:   Altered mental status  Active Problems:   Parkinson's disease (HCC)   Dysphagia   Abnormal CXR   Pneumonia   1.altered mental status likely secondary to developing pneumonia: continue antibiotics, IV hydration.  2.Metabolic encephalopathy likely secondary to developing pneumonia: Continue antibiotics.Clinically improving slowly. Worsening of her tremors after the albuterol treatment: The patient's family showed video   Of patient havingfworsened tremors  After breathing treatmentm. I discontinued the albuterol nebulizers.  #3. Parkinson disease:: Seen by neurology. MRI of the brain negative for stroke. Patient is right now on long-acting Sinemet.  4.Dysphagia ; seen by speech therapy. Continue dysphagia diet. #5 deconditioning physical therapy consult, patient family requesting her to sit in the chair.    discussed patient's daughters. More than 20 minutes are spent in counseling and coordination of care in addition to 20 minutes.   All the records are reviewed and case discussed with Care Management/Social Workerr. Management plans discussed with the patient, family and they are in agreement.  CODE STATUS: full  TOTAL TIME TAKING CARE OF THIS PATIENT:total time spent;40 min  POSSIBLE D/C IN 1-2  DAYS, DEPENDING ON CLINICAL CONDITION.   Katha HammingKONIDENA,Damico Partin M.D on 12/18/2015 at 12:45 PM  Between 7am to 6pm - Pager - [phone]  After 6pm go to [email or web address] - password [email or web address] [facility]  [facility]  Office  [phone]  CC: Primary care physician; Vonita MossMark Crissman, MD

## 2015-12-18 NOTE — Progress Notes (Signed)
MD notified that pt had not voided since 1413 (3/20), and had been placed on the bedpan a few times without any output. Given order for straight cath x1. 275 ml of urine collected. Will continue to monitor.

## 2015-12-18 NOTE — Progress Notes (Signed)
Pharmacy Antibiotic Note - Day 3  Elmarie Mainlandttalene W Yzaguirre is a 82 y.o. female admitted on 12/16/2015 with pneumonia. Patient remains NPO.  Plan:  Continue levofloxacin 250mg  IV daily.   Height: 5\' 1"  (154.9 cm) Weight: 100 lb 1.6 oz (45.405 kg) IBW/kg (Calculated) : 47.8  Temp (24hrs), Avg:98.2 F (36.8 C), Min:98 F (36.7 C), Max:98.7 F (37.1 C)   Recent Labs Lab 12/12/15 1131 12/16/15 1419 12/17/15 0509  WBC  --  8.2 7.6  CREATININE 0.76 0.58 0.70    Estimated Creatinine Clearance: 33.5 mL/min (by C-G formula based on Cr of 0.7).    Allergies  Allergen Reactions  . Phenergan [Promethazine Hcl] Other (See Comments)    Family states the patient almost went into a coma.  . Sulfa Antibiotics Other (See Comments)    Unknown reaction   Antibiotics:  Levofloxacin 3/19 >>  Dose Changes:  Levofloxacin 500mg  >> 250mg .   Pharmacy will continue to monitor and adjust per consult.   Charlies SilversMichael Kyrillos Adams  12/18/2015 11:17 AM

## 2015-12-19 MED ORDER — LEVOFLOXACIN 250 MG PO TABS
250.0000 mg | ORAL_TABLET | ORAL | Status: DC
  Administered 2015-12-19 – 2015-12-21 (×3): 250 mg via ORAL
  Filled 2015-12-19 (×3): qty 1

## 2015-12-19 NOTE — NC FL2 (Signed)
[place] MEDICAID FL2 LEVEL OF CARE SCREENING TOOL     IDENTIFICATION  Patient Name: [name] Birthdate: 1923/01/03 Sex: female Admission Date (Current Location): 12/16/2015  [facility] and [place] Number:  Chiropodist and Address:  [facility], [address] [place], [place] [address]      Provider Number: [id number]  Attending Physician Name and Address:  Katha Hamming, MD  Relative Name and Phone Number:       Current Level of Care: Hospital Recommended Level of Care: Skilled Nursing Facility Prior Approval Number:    Date Approved/Denied:   PASRR Number:    Discharge Plan: SNF    Current Diagnoses: Patient Active Problem List   Diagnosis Date Noted  . Pneumonia 12/17/2015  . Altered mental status 12/16/2015  . Parkinson's disease (HCC) 12/16/2015  . Dysphagia 12/16/2015  . Abnormal CXR 12/16/2015  . Confusion 11/12/2015  . B12 deficiency 10/29/2015  . Traumatic closed displaced fracture of multiple ribs 10/29/2015  . Tremor 07/04/2015  . Hypertension     Orientation RESPIRATION BLADDER Height & Weight     Self  Normal Continent Weight: 100 lb 1.6 oz (45.405 kg) Height:   (154.9 cm)  BEHAVIORAL SYMPTOMS/MOOD NEUROLOGICAL BOWEL NUTRITION STATUS   (none)  (parkinson's tremors) Continent Diet  AMBULATORY STATUS COMMUNICATION OF NEEDS Skin   Total Care Verbally Normal                       Personal Care Assistance Level of Assistance  Bathing, Feeding, Dressing Bathing Assistance: Maximum assistance Feeding assistance: Maximum assistance Dressing Assistance: Maximum assistance     Functional Limitations Info  Hearing   Hearing Info: Impaired      SPECIAL CARE FACTORS FREQUENCY  PT (By licensed PT)                    Contractures Contractures Info: Not present    Additional Factors Info  Allergies   Allergies Info: phenergan, sulfa           Current Medications  (12/19/2015):  This is the current hospital active medication list Current Facility-Administered Medications  Medication Dose Route Frequency Provider Last Rate Last Dose  . acetaminophen (TYLENOL) tablet 650 mg  650 mg Oral Q6H PRN Katha Hamming, MD       Or  . acetaminophen (TYLENOL) suppository 650 mg  650 mg Rectal Q6H PRN Katha Hamming, MD      . aspirin EC tablet 81 mg  81 mg Oral Daily Marguarite Arbour, MD   81 mg at 12/19/15 1105  . bisacodyl (DULCOLAX) suppository 10 mg  10 mg Rectal Daily PRN Marguarite Arbour, MD      . Carbidopa-Levodopa ER (SINEMET CR) 25-100 MG tablet controlled release 1 tablet  1 tablet Oral BID Thana Farr, MD   1 tablet at 12/19/15 1349  . docusate sodium (COLACE) capsule 100 mg  100 mg Oral BID Marguarite Arbour, MD   100 mg at 12/16/15 2156  . enoxaparin (LOVENOX) injection 40 mg  40 mg Subcutaneous Q24H Katha Hamming, MD   40 mg at 12/18/15 1703  . hydrALAZINE (APRESOLINE) tablet 25 mg  25 mg Oral 3 times per day Katha Hamming, MD   25 mg at 12/19/15 1349  . Levofloxacin (LEVAQUIN) IVPB 250 mg  250 mg Intravenous Q24H Marguarite Arbour, MD   250 mg at 12/18/15 1705  . metoprolol succinate (TOPROL-XL) 24 hr tablet  25 mg  25 mg Oral Daily Katha HammingSnehalatha Konidena, MD   25 mg at 12/19/15 1105  . ondansetron (ZOFRAN) tablet 4 mg  4 mg Oral Q6H PRN Marguarite ArbourJeffrey D Sparks, MD       Or  . ondansetron [facility](ZOFRAN) injection 4 mg  4 mg Intravenous Q6H PRN Marguarite ArbourJeffrey D Sparks, MD         Discharge Medications: Please see discharge summary for a list of discharge medications.  Relevant Imaging Results:  Relevant Lab Results:   Additional Information SS: [id number]  York SpanielMonica Karstyn Birkey, LCSW

## 2015-12-19 NOTE — Clinical Social Work Note (Signed)
Patient's family now willing for bedsearch to be initiated. Bedsearch initiated today. York SpanielMonica Glenford Garis MSW,LCSW [phone]

## 2015-12-19 NOTE — Progress Notes (Signed)
Subjective: Patient improved today.  Awake and alert.  Continued tremors.    Objective: Current vital signs: BP 157/52 mmHg  Pulse 64  Temp(Src) 97.9 F (36.6 C) (Axillary)  Resp 19  Ht  (1.549 m)  Wt 45.405 kg (100 lb 1.6 oz)  BMI 18.92 kg/m2  SpO2 97% Vital signs in last 24 hours: Temp:  [97.1 F (36.2 C)-98.4 F (36.9 C)] 97.9 F (36.6 C) (03/22 0931) Pulse Rate:  [59-77] 64 (03/22 0931) Resp:  [18-19] 19 (03/22 0931) BP: (135-165)/(50-104) 157/52 mmHg (03/22 0931) SpO2:  [97 %-100 %] 97 % (03/22 0931) Weight:  [45.405 kg (100 lb 1.6 oz)] 45.405 kg (100 lb 1.6 oz) (03/22 0500)  Intake/Output from previous day: 03/21 0701 - 03/22 0700 In: 220 [P.O.:120; IV Piggyback:100] Out: 800 [Urine:800] Intake/Output this shift: Total I/O In: -  Out: 400 [Urine:400] Nutritional status: DIET - DYS 1 Room service appropriate?: Yes with Assist; Fluid consistency:: Thin  Neurologic Exam: Mental Status: Awake, alert and responding appropriately to questioning.  Follows simple commands.   Cranial Nerves: II: Discs flat bilaterally; Blinks to bilateral confrontation, pupils equal, round, reactive to light and accommodation III,IV, VI: ptosis not present, EOMI V,VII:  facial light touch sensation normal bilaterally VIII: grossly intact IX,X: intact gag XI: bilateral shoulder shrug XII: midline tongue extension Motor: Patient moves all extremities with no focal weakness noted. Tremor noted in all extremities (right greater than left). Head tremor noted as well.   Lab Results: Basic Metabolic Panel:  Recent Labs Lab 12/12/15 1131 12/16/15 1419 12/17/15 0509  NA 140 139 140  K 4.3 3.7 4.0  CL 101 106 111  CO2 GLUCOSE 82 98 107*  BUN 21 22* 18  CREATININE 0.76 0.58 0.70  CALCIUM 9.5 9.6 9.1    Liver Function Tests:  Recent Labs Lab 12/16/15 1419 12/17/15 0509  AST 25 30  ALT <5* 13*  ALKPHOS 66 61  BILITOT 0.9 1.3*  PROT 6.8 6.1*  ALBUMIN  4.1 3.8   No results for input(s): LIPASE, AMYLASE in the last 168 hours. No results for input(s): AMMONIA in the last 168 hours.  CBC:  Recent Labs Lab 12/16/15 1419 12/17/15 0509  WBC 8.2 7.6  HGB 13.1 12.5  HCT 37.8 36.3  MCV 90.0 90.5  PLT 227 216    Cardiac Enzymes:  Recent Labs Lab 12/16/15 1419  TROPONINI <0.03    Lipid Panel: No results for input(s): CHOL, TRIG, HDL, CHOLHDL, VLDL, LDLCALC in the last 168 hours.  CBG: No results for input(s): GLUCAP in the last 168 hours.  Microbiology: Results for orders placed or performed in visit on 12/12/15  Microscopic Examination     Status: None   Collection Time: 12/12/15 11:10 AM  Result Value Ref Range Status   WBC, UA 0-5 0 -  5 /hpf Final   RBC, UA 0-2 0 -  2 /hpf Final   Epithelial Cells (non renal) 0-10 0 - 10 /hpf Final   Mucus, UA Present Not Estab. Final   Bacteria, UA Few None seen/Few Final  Urine culture     Status: None   Collection Time: 12/12/15 11:31 AM  Result Value Ref Range Status   Urine Culture, Routine Final report  Final   Urine Culture result 1 Comment  Final    Comment: Culture shows less than 10,000 colony forming units of bacteria per milliliter of urine. This colony count is not generally considered to be clinically significant.  Coagulation Studies: No results for input(s): LABPROT, INR in the last 72 hours.  Imaging: Mr Sherrin DaisyBrain Wo Contrast  12/17/2015  CLINICAL DATA:  Parkinson's disease. Worsening confusion and tremor. EXAM: MRI HEAD WITHOUT CONTRAST TECHNIQUE: Multiplanar, multiecho pulse sequences of the brain and surrounding structures were obtained without intravenous contrast. COMPARISON:  Head CT 12/16/2015 FINDINGS: The study suffers from motion degradation. There chronic small-vessel ischemic changes affecting the pons. No focal cerebellar abnormality. There are moderate chronic small-vessel ischemic changes affecting the cerebral hemispheric deep and subcortical white  matter. No cortical or large vessel territory infarction. Diffusion imaging does not show any acute or subacute infarction. No mass lesion, hemorrhage, hydrocephalus or extra-axial collection. No pituitary mass. Mild edema in the ethmoid and sphenoid sinus. Major vessels at the base of the brain show flow. IMPRESSION: No acute or reversible finding. Moderate chronic small-vessel ischemic changes throughout the brain, fairly typical for age. Electronically Signed   By: Paulina FusiMark  Shogry M.D.   On: 12/17/2015 12:42    Medications:  I have reviewed the patient's current medications. Scheduled: . aspirin EC  81 mg Oral Daily  . Carbidopa-Levodopa ER  1 tablet Oral BID  . docusate sodium  100 mg Oral BID  . enoxaparin (LOVENOX) injection  40 mg Subcutaneous Q24H  . hydrALAZINE  25 mg Oral 3 times per day  . levofloxacin (LEVAQUIN) IV  250 mg Intravenous Q24H  . metoprolol succinate  25 mg Oral Daily    Assessment/Plan: Patient improved on antibiotics.  Remains on low dose Sinemet which should be continued.    Recommendations: 1.  Continue Sinemet at current dose 2.  No further neurologic intervention is recommended at this time.  If further questions arise, please call or page at that time.  Thank you for allowing neurology to participate in the care of this patient.  Patient to follow up with Dr. Malvin JohnsPotter at discharge.    Thana FarrLeslie Lunell Robart, MD Neurology [phone] 12/19/2015  10:16 AM    LOS: 2 days

## 2015-12-19 NOTE — Progress Notes (Signed)
Speech Therapy Note: reviewed chart notes; met w/ Dtr and pt this PM. Pt was sleeping but family reports pt is more alert and is able to maintain attention and strength during eating/drinking at meals. Family would like to tray diet consistency upgrade w/ pt at this time. Post discussion of food options and diet consistency, rec. Trial upgrade to Dys. 2 w/ thin liquids w/ supervision at meal tonight by Dtr/family. ST will f/u in AM w/ diet toleration and possible further upgrade of food consistency. Dtr agreed. NSG updated.

## 2015-12-19 NOTE — Plan of Care (Signed)
Problem: Nutrition: Goal: Adequate nutrition will be maintained Outcome: Progressing Pt has had a better appetite today and has eaten at least half of her meals during my shift.

## 2015-12-19 NOTE — Care Management Important Message (Signed)
Important Message  Patient Details  Name: [name] MRN: [id number] Date of Birth: 10-27-1922   Medicare Important Message Given:  Yes    Olegario MessierKathy A Jaimi Belle 12/19/2015, 2:04 PM

## 2015-12-19 NOTE — Progress Notes (Signed)
[facility] - [place] at [facility]   PATIENT NAME: Cleophas Dunkerttalene Maulding    MR#:  [id number]  DATE OF BIRTH:  [birth year]  SUBJECTIVE: Patient is doing much better today she is more alert and oriented and  Po  intake improved.   CHIEF COMPLAINT:   Chief Complaint  Patient presents with  . Altered Mental Status  . Weakness   Admitted for pneumonia. Patient is aseen by neurology. MRI of the brain negative for acute stroke.    REVIEW OF SYSTEMS:    Review of Systems  Constitutional: Negative for fever and chills.  HENT: Negative for hearing loss.   Eyes: Negative for blurred vision, double vision and photophobia.  Respiratory: Negative for cough, hemoptysis and shortness of breath.   Cardiovascular: Negative for palpitations, orthopnea and leg swelling.  Gastrointestinal: Negative for vomiting, abdominal pain and diarrhea.  Genitourinary: Negative for dysuria and urgency.  Musculoskeletal: Negative for myalgias and neck pain.  Skin: Negative for rash.  Neurological: Negative for dizziness, sensory change, focal weakness, seizures, weakness and headaches.  Psychiatric/Behavioral: Negative for memory loss. The patient does not have insomnia.     Nutrition:npo Tolerating Diet: Tolerating PT:      DRUG ALLERGIES:   Allergies  Allergen Reactions  . Phenergan [Promethazine Hcl] Other (See Comments)    Family states the patient almost went into a coma.  . Sulfa Antibiotics Other (See Comments)    Unknown reaction    VITALS:  Blood pressure 157/52, pulse 64, temperature 97.9 F (36.6 C), temperature source Axillary, resp. rate 19, height 5\' 1"  (1.549 m), weight 45.405 kg (100 lb 1.6 oz), SpO2 97 %.  PHYSICAL EXAMINATION:   Physical Exam  GENERAL:  82 y.o.-year-old patient lying in the bed with no acute distress.  EYES: Pupils equal, round, reactive to light and accommodation. No scleral icterus. Extraocular muscles intact.  HEENT: Head atraumatic,  normocephalic. Oropharynx and nasopharynx clear.  NECK:  Supple, no jugular venous distention. No thyroid enlargement, no tenderness.  LUNGS: Normal breath sounds bilaterally, no wheezing, rales,rhonchi or crepitation. No use of accessory muscles of respiration.  CARDIOVASCULAR: S1, S2 normal. No murmurs, rubs, or gallops.  ABDOMEN: Soft, nontender, nondistended. Bowel sounds present. No organomegaly or mass.  EXTREMITIES: No pedal edema, cyanosis, or clubbing.  NEUROLOGIC: Patient has no focal neurological deficit.. Continues tremors noted in hands. Psychiatric./: The patient is alert and awake. N: No obvious rash, lesion, or ulcer.    LABORATORY PANEL:   CBC  Recent Labs Lab 12/17/15 0509  WBC 7.6  HGB 12.5  HCT 36.3  PLT 216   ------------------------------------------------------------------------------------------------------------------  Chemistries   Recent Labs Lab 12/17/15 0509  NA 140  K 4.0  CL 111  CO2 24  GLUCOSE 107*  BUN 18  CREATININE 0.70  CALCIUM 9.1  AST 30  ALT 13*  ALKPHOS 61  BILITOT 1.3*   ------------------------------------------------------------------------------------------------------------------  Cardiac Enzymes  Recent Labs Lab 12/16/15 1419  TROPONINI <0.03   ------------------------------------------------------------------------------------------------------------------  RADIOLOGY:  Mr Brain Wo Contrast  12/17/2015  CLINICAL DATA:  Parkinson's disease. Worsening confusion and tremor. EXAM: MRI HEAD WITHOUT CONTRAST TECHNIQUE: Multiplanar, multiecho pulse sequences of the brain and surrounding structures were obtained without intravenous contrast. COMPARISON:  Head CT 12/16/2015 FINDINGS: The study suffers from motion degradation. There chronic small-vessel ischemic changes affecting the pons. No focal cerebellar abnormality. There are moderate chronic small-vessel ischemic changes affecting the cerebral hemispheric deep and  subcortical white matter. No cortical or large vessel territory infarction.  Diffusion imaging does not show any acute or subacute infarction. No mass lesion, hemorrhage, hydrocephalus or extra-axial collection. No pituitary mass. Mild edema in the ethmoid and sphenoid sinus. Major vessels at the base of the brain show flow. IMPRESSION: No acute or reversible finding. Moderate chronic small-vessel ischemic changes throughout the brain, fairly typical for age. Electronically Signed   By: Paulina Fusi M.D.   On: 12/17/2015 12:42     ASSESSMENT AND PLAN:   Principal Problem:   Altered mental status Active Problems:   Parkinson's disease (HCC)   Dysphagia   Abnormal CXR   Pneumonia   1.altered mental status likely secondary to developing pneumonia: continue antibiotics, IV hydration.. Patient clinically improving ,is more alert and oriented today.  2.Metabolic encephalopathy likely secondary to developing pneumonia: Continue antibiotics.Clinically improving slowly.    #3. Parkinson disease:: Seen by neurology. MRI of the brain negative for stroke. Patient is right now on long-acting Sinemet.  No Further neurological workup is warranted. 4.Dysphagia ; seen by speech therapy. Continue dysphagia diet.  #5 deconditioning physical therapy consult, Likely discharge in 1-2 days.   All the records are reviewed and case discussed with Care Management/Social Workerr. Management plans discussed with the patient, family and they are in agreement.  CODE STATUS: full  TOTAL TIME TAKING CARE OF THIS PATIENT:25   POSSIBLE D/C IN 1-2  DAYS, DEPENDING ON CLINICAL CONDITION.   Katha Hamming M.D on 12/19/2015 at 12:00 PM  Between 7am to 6pm - Pager - [phone]  After 6pm go to [email or web address] - password [email or web address] [facility]  [place]  Office  [phone]  CC: Primary care physician; Vonita Moss, MD

## 2015-12-20 LAB — CREATININE, SERUM
Creatinine, Ser: 0.63 mg/dL (ref 0.44–1.00)
GFR calc Af Amer: >60 mL/min (ref >60)
GFR calc non Af Amer: >60 mL/min (ref >60)

## 2015-12-20 NOTE — Progress Notes (Signed)
Physical Therapy Treatment Patient Details Name: [name] MRN: [id number] DOB: [birth year] Today's Date: 12/20/2015    History of Present Illness [name] is a 82 y.o. female has a past medical history significant for Parkinson's and HTN who lives with her husband at home. Has been having worsening issues with her Parkinson's and her meds have been adjusted recently. Presents now with worsening confusion and tremor, falling to the right. Slight cough. Has been eating normally at home. No fever. No change in bowels or bladder. W/u in ER including head CT non-diagnostic. Family unable to care for patient in this condition. She is now admitted. Previously pt was very independent. She did suffer 1 fall in the last 12 months. Occurred in January 2017 with 3 R sided rib fractures. History obtained from daughters as pt is AOx1 for first name only at time of evaluation    PT Comments    Pt. remains confused, however agreeable to perform therapy. Upon arrival, pt seated in recliner, heavy R sided leaning noted along with tremors in B UE. Good participation with there-ex, although pt remains weak and needs assist for all exercise. Pt able to participate in standing trials, however due to poor balance unable to further ambulate at this time. Continue to recommend SNF for safety/balance/mobility after acute care hospital stay. Family requesting PT next date to further assess.   Follow Up Recommendations  SNF     Equipment Recommendations  None recommended by PT    Recommendations for Other Services       Precautions / Restrictions Precautions Precautions: Fall Restrictions Weight Bearing Restrictions: No    Mobility  Bed Mobility               General bed mobility comments: Pt received sitting in chair, bed mobility not performed  Transfers Overall transfer level: Needs assistance Equipment used: Rolling walker (2 wheeled) Transfers: Sit to/from Stand Sit to Stand: Mod  assist         General transfer comment: 3 attempts for sit to stand using RW with heavy posterior leaning. Increased tremors noted with function and pt very anxious about movement. Pt stands w/ very narrow BOS increasing fall risk. Pt only able to stand about 10 sec each trail with uncontrolled return back to chair.   Ambulation/Gait             General Gait Details: Not safe at this time. Pt able to take small steps in place however due to loss of balance, not able to safely ambulate.   Stairs            Wheelchair Mobility    Modified Rankin (Stroke Patients Only)       Balance                                    Cognition Arousal/Alertness: Awake/alert Behavior During Therapy: Flat affect Overall Cognitive Status: Impaired/Different from baseline Area of Impairment: Orientation Orientation Level: Disoriented to;Place;Time;Situation                  Exercises Other Exercises Other Exercises: Pt performed seated ther-ex including B LE ankle pumps, SAQ, SLR, hip ab/ad, and forward reaching with B UE facilitating anterior weightshifting with mod assist from therapist. Ex was performed 10 times on LE and 4x on UE. Heavy cues for participation.     General Comments  Pertinent Vitals/Pain Pain Assessment: No/denies pain    Home Living                      Prior Function            PT Goals (current goals can now be found in the care plan section) Acute Rehab PT Goals Patient Stated Goal: family would like patient to return to prior function PT Goal Formulation: With family Time For Goal Achievement: 12/31/15 Potential to Achieve Goals: Fair Progress towards PT goals: Progressing toward goals    Frequency  Min 2X/week    PT Plan Current plan remains appropriate    Co-evaluation             End of Session Equipment Utilized During Treatment: Gait belt Activity Tolerance: Patient limited by fatigue Patient  left: in chair;with chair alarm set;with family/visitor present     Time: 1001-1026 PT Time Calculation (min) (ACUTE ONLY): 25 min  Charges:  $Therapeutic Exercise: 8-22 mins $Therapeutic Activity: 8-22 mins                    G Codes:      Yamil Dougher 2016-01-18, 11:00 AM Elizabeth Palau, PT, DPT [phone]

## 2015-12-20 NOTE — Clinical Social Work Note (Signed)
Patient has had multiple bed offers. CSW has spoken to patient's daughter, Damian Leavellrudy, who has chosen [facility]. [facility] discharge. York SpanielMonica Hadyn Blanck MSW,LCSW [phone]

## 2015-12-20 NOTE — Progress Notes (Addendum)
Speech Language Pathology Treatment: Dysphagia  Patient Details Name: [name] MRN: [id number] DOB: 1923-03-15 Today's Date: 12/20/2015 Time: 1200-1230 SLP Time Calculation (min) (ACUTE ONLY): 30 min  Assessment / Plan / Recommendation Clinical Impression  Pt appears to be tolerating diet consistency of Dys. 2 w/ thin liquids and during this session was able to masticated and adequately clear ongoing foods including fruits and vegetables; pt does not eat much meat per Dtr. present. Pt is tolerating sips of thin liquids following general aspiration precautions and given support d/t her tremorous UEs sec. To Parkinson's Dis. Dtr would like to try the Dys. 3 diet consistency in order to add more food choice to her meals. Encouraged thorough cutting of meats and moistening well w/ gravy; Dtr agreed. Reviewed chart notes; labs; vitals.  Pt continues to present w/ weakness and baseline Neurological dis. which can increase risk for aspiration; she is at min. risk but appears to be adequately tolerating a po diet w/ feeding support and aspiration precautions. Rec. Dietician f/u as indicated for support w/ supplements. Strongly rec. ALL meds in Puree; mixed w/ puree for safer swallowing. Dtr agreed. NSG updated.     HPI HPI: Pt is a 82 y.o. female has a past medical history significant for Parkinson's Dis., arthritis, and HTN who lives with her husband at home(noted husband in w/c requiring assistance from Dtr w/ meal setup). Has been having worsening issues with her Parkinson's and her meds have been adjusted recently. Presents now with worsening confusion and tremor, falling to the right. Slight cough. Has been eating normally at home. No fever. No change in bowels or bladder. W/u in ER including head CT non-diagnostic. Family unable to care for patient in this condition. She is now admitted. Pt presents w/ reduced articulation of speech; mumbled speech impacted by attention to task during speech. Some  confusion when answering basic questions; oriented to self and family members in room. Followed basic commands w/ cues. Appeared weak w/ tremorous UE activity unable to feed self w/ full assistance. Per Dtr, pt has tolerated trial meals of Dys. 2 w/ feeding asisstance.       SLP Plan  Continue with current plan of care     Recommendations  Diet recommendations: Dysphagia 3 (mechanical soft);Thin liquid Liquids provided via: Cup;Straw Medication Administration: Crushed with puree Supervision: Full supervision/cueing for compensatory strategies;Trained caregiver to feed patient Compensations: Minimize environmental distractions;Slow rate;Small sips/bites;Lingual sweep for clearance of pocketing;Follow solids with liquid (rest breaks as nec. ) Postural Changes and/or Swallow Maneuvers: Seated upright 90 degrees;Upright 30-60 min after meal             General recommendations:  (Dietician f/u as indicated) Oral Care Recommendations: Oral care BID;Staff/trained caregiver to provide oral care Follow up Recommendations: Skilled Nursing facility (TBD) Plan: Continue with current plan of care     GO               Jerilynn SomKatherine Watson, MS, CCC-SLP  Watson,Katherine 12/20/2015, 4:00 PM

## 2015-12-20 NOTE — Progress Notes (Signed)
[facility] [place] at [facility]   PATIENT NAME: [name]    MR#:  [id number]  DATE OF BIRTH:  1923/01/26  SUBJECTIVE: Patient is alert,awake,oriented,sitting in chair today.  CHIEF COMPLAINT:   Chief Complaint  Patient presents with  . Altered Mental Status  . Weakness   Admitted for pneumonia. Patient is aseen by neurology. MRI of the brain negative for acute stroke.    REVIEW OF SYSTEMS:    Review of Systems  Constitutional: Negative for fever and chills.  HENT: Negative for hearing loss.   Eyes: Negative for blurred vision, double vision and photophobia.  Respiratory: Negative for cough, hemoptysis and shortness of breath.   Cardiovascular: Negative for palpitations, orthopnea and leg swelling.  Gastrointestinal: Negative for vomiting, abdominal pain and diarrhea.  Genitourinary: Negative for dysuria and urgency.  Musculoskeletal: Negative for myalgias and neck pain.  Skin: Negative for rash.  Neurological: Negative for dizziness, sensory change, focal weakness, seizures, weakness and headaches.  Psychiatric/Behavioral: Negative for memory loss. The patient does not have insomnia.     Nutrition:npo Tolerating Diet: Tolerating PT:      DRUG ALLERGIES:   Allergies  Allergen Reactions  . Phenergan [Promethazine Hcl] Other (See Comments)    Family states the patient almost went into a coma.  . Sulfa Antibiotics Other (See Comments)    Unknown reaction    VITALS:  Blood pressure 158/72, pulse 71, temperature 97.8 F (36.6 C), temperature source Oral, resp. rate 21, height 5\' 1"  (1.549 m), weight 45.405 kg (100 lb 1.6 oz), SpO2 98 %.  PHYSICAL EXAMINATION:   Physical Exam  GENERAL:  82 y.o.-year-old patient lying in the bed with no acute distress.  EYES: Pupils equal, round, reactive to light and accommodation. No scleral icterus. Extraocular muscles intact.  HEENT: Head atraumatic, normocephalic. Oropharynx and nasopharynx  clear.  NECK:  Supple, no jugular venous distention. No thyroid enlargement, no tenderness.  LUNGS: Normal breath sounds bilaterally, no wheezing, rales,rhonchi or crepitation. No use of accessory muscles of respiration.  CARDIOVASCULAR: S1, S2 normal. No murmurs, rubs, or gallops.  ABDOMEN: Soft, nontender, nondistended. Bowel sounds present. No organomegaly or mass.  EXTREMITIES: No pedal edema, cyanosis, or clubbing.  NEUROLOGIC: Patient has no focal neurological deficit.. Continues tremors noted in hands. Psychiatric./: The patient is alert and awake. N: No obvious rash, lesion, or ulcer.    LABORATORY PANEL:   CBC  Recent Labs Lab 12/17/15 0509  WBC 7.6  HGB 12.5  HCT 36.3  PLT 216   ------------------------------------------------------------------------------------------------------------------  Chemistries   Recent Labs Lab 12/17/15 0509 12/20/15 0520  NA 140  --   K 4.0  --   CL 111  --   CO2 24  --   GLUCOSE 107*  --   BUN 18  --   CREATININE 0.70 0.63  CALCIUM 9.1  --   AST 30  --   ALT 13*  --   ALKPHOS 61  --   BILITOT 1.3*  --    ------------------------------------------------------------------------------------------------------------------  Cardiac Enzymes  Recent Labs Lab 12/16/15 1419  TROPONINI <0.03   ------------------------------------------------------------------------------------------------------------------  RADIOLOGY:  No results found.   ASSESSMENT AND PLAN:   Principal Problem:   Altered mental status Active Problems:   Parkinson's disease (HCC)   Dysphagia   Abnormal CXR   Pneumonia   1.altered mental status likely secondary to developing pneumonia: continue antibiotics, IV hydration.. Patient clinically improving ,is more alert and oriented today.  2.Metabolic encephalopathy  Due  to pneumonia;improved;continue levaquin for 7 days.chest xray today for follow up  #3. Parkinson disease:: Seen by neurology. MRI of  the brain negative for stroke. Patient is right now on long-acting Sinemet.  No Further neurological workup is warranted.  4.Dysphagia ; seen by speech therapy. Continue dysphagia diet.  #5 deconditioning ;PT recommends SNF.  Likely discharge in 1-2 days. D/w daughter at bedside, More than 50% time spent in counseling and coordination of care.   All the records are reviewed and case discussed with Care Management/Social Workerr. Management plans discussed with the patient, family and they are in agreement.  CODE STATUS: full  TOTAL TIME TAKING CARE OF THIS PATIENT:35  POSSIBLE D/C IN 1-2  DAYS, DEPENDING ON CLINICAL CONDITION.   Katha Hamming M.D on 12/20/2015 at 4:37 PM  Between 7am to 6pm - Pager - [phone]  After 6pm go to [email or web address] - password [email or web address] [facility]  [place] [facility]  Office  [phone]  CC: Primary care physician; Vonita Moss, MD

## 2015-12-21 ENCOUNTER — Encounter
Admission: RE | Admit: 2015-12-21 | Discharge: 2015-12-21 | Disposition: A | Discharge status: Home / Self Care | Payer: Medicare Other | Source: Ambulatory Visit | Attending: Internal Medicine | Admitting: Internal Medicine

## 2015-12-21 ENCOUNTER — Inpatient Hospital Stay: Payer: Medicare Other

## 2015-12-21 LAB — BASIC METABOLIC PANEL
Anion gap: 6 (ref 5–15)
BUN: 30 mg/dL — ABNORMAL HIGH (ref 6–20)
CO2: 24 mmol/L (ref 22–32)
Calcium: 9.4 mg/dL (ref 8.9–10.3)
Chloride: 105 mmol/L (ref 101–111)
Creatinine, Ser: 0.8 mg/dL (ref 0.44–1.00)
GFR calc Af Amer: >60 mL/min (ref >60)
GFR calc non Af Amer: >60 mL/min (ref >60)
Glucose, Bld: 129 mg/dL — ABNORMAL HIGH (ref 65–99)
Potassium: 4.1 mmol/L (ref 3.5–5.1)
Sodium: 135 mmol/L (ref 135–145)

## 2015-12-21 LAB — CBC
HCT: 41.2 % (ref 35.0–47.0)
Hemoglobin: 13.7 g/dL (ref 12.0–16.0)
MCH: 30.5 pg (ref 26.0–34.0)
MCHC: 33.2 g/dL (ref 32.0–36.0)
MCV: 92.1 fL (ref 80.0–100.0)
Platelets: 218 10*3/uL (ref 150–440)
RBC: 4.47 MIL/uL (ref 3.80–5.20)
RDW: 13.4 % (ref 11.5–14.5)
WBC: 11.7 10*3/uL — ABNORMAL HIGH (ref 3.6–11.0)

## 2015-12-21 MED ORDER — DOCUSATE SODIUM 50 MG/5ML PO LIQD
100.0000 mg | Freq: Two times a day (BID) | ORAL | Status: DC
  Administered 2015-12-21 – 2015-12-24 (×5): 100 mg via ORAL
  Filled 2015-12-21 (×7): qty 10

## 2015-12-21 MED ORDER — HYDRALAZINE HCL 25 MG PO TABS: 25.0000 mg | ORAL_TABLET | Freq: Three times a day (TID) | ORAL | Status: DC

## 2015-12-21 MED ORDER — VANCOMYCIN HCL IN DEXTROSE 750-5 MG/150ML-% IV SOLN
750.0000 mg | Freq: Once | INTRAVENOUS | Status: AC
  Administered 2015-12-22: 750 mg via INTRAVENOUS
  Filled 2015-12-21: qty 150

## 2015-12-21 MED ORDER — CARBIDOPA-LEVODOPA ER 25-100 MG PO TBCR
1.0000 | EXTENDED_RELEASE_TABLET | Freq: Two times a day (BID) | ORAL | Status: AC
Start: 2015-12-21 — End: ?

## 2015-12-21 MED ORDER — VANCOMYCIN HCL IN DEXTROSE 1-5 GM/200ML-% IV SOLN: 1000.0000 mg | INTRAVENOUS | Status: DC

## 2015-12-21 MED ORDER — PIPERACILLIN-TAZOBACTAM 4.5 G IVPB
4.5000 g | Freq: Three times a day (TID) | INTRAVENOUS | Status: DC
  Administered 2015-12-22 – 2015-12-24 (×8): 4.5 g via INTRAVENOUS
  Filled 2015-12-21 (×10): qty 100

## 2015-12-21 MED ORDER — LEVOFLOXACIN 250 MG PO TABS: 250.0000 mg | ORAL_TABLET | ORAL | Status: DC

## 2015-12-21 NOTE — Care Management Important Message (Signed)
Important Message  Patient Details  Name: [name] MRN: [id number] Date of Birth: 12-Jun-1923   Medicare Important Message Given:  Yes    Olegario MessierKathy A Santino Kinsella 12/21/2015, 12:59 PM

## 2015-12-21 NOTE — Progress Notes (Addendum)
Pharmacy Antibiotic Note  [name] is a 82 y.o. female admitted on 12/16/2015 with pneumonia.  Pharmacy has been consulted for vancomycin and Zosyn dosing.  Plan: DW 46.6kg  Vd 33L kei 0.033 hr-1  T1/2 21 hours Vancomycin 750 mg q 24 hours ordered with stacked dosing.  Level before 5th dose. Goal trough 15-20.  Zosyn 4.5 grams q 8 hours ordered for Pseudomonas risk of hospitalization 5 days or longer.  Height: 5\' 1"  (154.9 cm) Weight: 102 lb 12.8 oz (46.63 kg) IBW/kg (Calculated) : 47.8  Temp (24hrs), Avg:97.5 F (36.4 C), Min:97.3 F (36.3 C), Max:97.8 F (36.6 C)   Recent Labs Lab 12/16/15 1419 12/17/15 0509 12/20/15 0520 12/21/15 1457  WBC 8.2 7.6  --  11.7*  CREATININE 0.58 0.70 0.63 0.80    Estimated Creatinine Clearance: 34.4 mL/min (by C-G formula based on Cr of 0.8).    Allergies  Allergen Reactions  . Phenergan [Promethazine Hcl] Other (See Comments)    Family states the patient almost went into a coma.  . Sulfa Antibiotics Other (See Comments)    Unknown reaction    Antimicrobials this admission: Levaquin  >> vancomycin Zosyn   >>   Dose adjustments this admission:   Microbiology results: No micro this admission    3/24 UA pending 3/24 CXR: R base opacity  Thank you for allowing pharmacy to be a part of this patient's care.  Artisha Capri S 12/21/2015 11:22 PM

## 2015-12-21 NOTE — Clinical Social Work Note (Signed)
MD stated patient would [facility] tomorrow. CSW has informed Kim at [facility] of expected weekend discharge. York SpanielMonica Ronika Kelson MSW,LCSW [phone]

## 2015-12-21 NOTE — Progress Notes (Signed)
[facility] - [place] at [facility]   PATIENT NAME: [name]    MR#:  [id number]  DATE OF BIRTH:  August 12, 1923  SUBJECTIVE: Patient is alert,awake,oriented,sitting in chair today. adding to the daughter patient had a very bad night and was having some hallucinations. And the Sinemet was given yesterday morning at 8 and also treated afternoon at 2pm.   CHIEF COMPLAINT:   Chief Complaint  Patient presents with  . Altered Mental Status  . Weakness   Admitted for pneumonia. Patient is aseen by neurology. MRI of the brain negative for acute stroke.    REVIEW OF SYSTEMS:    Review of Systems  Constitutional: Negative for fever and chills.  HENT: Negative for hearing loss.   Eyes: Negative for blurred vision, double vision and photophobia.  Respiratory: Negative for cough, hemoptysis and shortness of breath.   Cardiovascular: Negative for palpitations, orthopnea and leg swelling.  Gastrointestinal: Negative for vomiting, abdominal pain and diarrhea.  Genitourinary: Negative for dysuria and urgency.  Musculoskeletal: Negative for myalgias and neck pain.  Skin: Negative for rash.  Neurological: Negative for dizziness, sensory change, focal weakness, seizures, weakness and headaches.  Psychiatric/Behavioral: Negative for memory loss. The patient does not have insomnia.     Nutrition:npo Tolerating Diet: Tolerating PT:      DRUG ALLERGIES:   Allergies  Allergen Reactions  . Phenergan [Promethazine Hcl] Other (See Comments)    Family states the patient almost went into a coma.  . Sulfa Antibiotics Other (See Comments)    Unknown reaction    VITALS:  Blood pressure 149/73, pulse 89, temperature 97.8 F (36.6 C), temperature source Oral, resp. rate 19, height 5\' 1"  (1.549 m), weight 46.63 kg (102 lb 12.8 oz), SpO2 98 %.  PHYSICAL EXAMINATION:   Physical Exam  GENERAL:  82 y.o.-year-old patient lying in the bed with no acute distress.  EYES:  Pupils equal, round, reactive to light and accommodation. No scleral icterus. Extraocular muscles intact.  HEENT: Head atraumatic, normocephalic. Oropharynx and nasopharynx clear.  NECK:  Supple, no jugular venous distention. No thyroid enlargement, no tenderness.  LUNGS: Normal breath sounds bilaterally, no wheezing, rales,rhonchi or crepitation. No use of accessory muscles of respiration.  CARDIOVASCULAR: S1, S2 normal. No murmurs, rubs, or gallops.  ABDOMEN: Soft, nontender, nondistended. Bowel sounds present. No organomegaly or mass.  EXTREMITIES: No pedal edema, cyanosis, or clubbing.  NEUROLOGIC: Patient has no focal neurological deficit.. Continues tremors noted in hands. Psychiatric./: The patient is alert and awake. N: No obvious rash, lesion, or ulcer.    LABORATORY PANEL:   CBC  Recent Labs Lab 12/17/15 0509  WBC 7.6  HGB 12.5  HCT 36.3  PLT 216   ------------------------------------------------------------------------------------------------------------------  Chemistries   Recent Labs Lab 12/17/15 0509 12/20/15 0520  NA 140  --   K 4.0  --   CL 111  --   CO2 24  --   GLUCOSE 107*  --   BUN 18  --   CREATININE 0.70 0.63  CALCIUM 9.1  --   AST 30  --   ALT 13*  --   ALKPHOS 61  --   BILITOT 1.3*  --    ------------------------------------------------------------------------------------------------------------------  Cardiac Enzymes  Recent Labs Lab 12/16/15 1419  TROPONINI <0.03   ------------------------------------------------------------------------------------------------------------------  RADIOLOGY:  No results found.   ASSESSMENT AND PLAN:   Principal Problem:   Altered mental status Active Problems:   Parkinson's disease (HCC)   Dysphagia   Abnormal CXR  Pneumonia   1.altered mental status likely secondary to developing pneumonia: continue antibiotics, IV hydration.. Patient clinically improving ,is more alert and oriented  today.  2.Metabolic encephalopathy  Due to pneumonia;improved;continue levaquin for 7 days.chest xray today for follow up  #3. Parkinson disease:: Seen by neurology. MRI of the brain negative for stroke. Patient is right now on long-acting Sinemet.  No Further neurological workup is warranted. Dosing of Sinemet should be twice a day.   dose should be at 10 PM today. Hopefully if she gets twice a day dosing she'll have a better night if not will repeat urine cultures and chest x-ray and blood cultures.  4.Dysphagia ; seen by speech therapy. Continue dysphagia diet.  #5 deconditioning ;PT recommends SNF.  Likely discharge to [facility] rehabilitation tomorrow.   More than 50% time spent in counseling and coordination of care.   All the records are reviewed and case discussed with Care Management/Social Workerr. Management plans discussed with the patient, family and they are in agreement.  CODE STATUS: full  TOTAL TIME TAKING CARE OF THIS PATIENT:35  POSSIBLE D/C IN 1-2  DAYS, DEPENDING ON CLINICAL CONDITION.   Katha Hamming M.D on 12/21/2015 at 1:15 PM  Between 7am to 6pm - Pager - [phone]  After 6pm go to [email or web address] - password [email or web address] [facility]  [place] [facility]  Office  [phone]  CC: Primary care physician; Vonita Moss, MD

## 2015-12-21 NOTE — Progress Notes (Signed)
Discussed with Barbara CowerJason, Pharmacist,earlier, regarding patient's aspiration precautions and trouble swallowing pills; able to change capsule to liquid Colace. Windy Carinaurner,Fahad Cisse K, RN 2:15 AM 12/21/2015

## 2015-12-21 NOTE — Progress Notes (Signed)
Physical Therapy Treatment Patient Details Name: [name] MRN: [id number] DOB: Aug 06, 1923 Today's Date: 12/21/2015    History of Present Illness [name] is a 82 y.o. female has a past medical history significant for Parkinson's and HTN who lives with her husband at home. Has been having worsening issues with her Parkinson's and her meds have been adjusted recently. Presents now with worsening confusion and tremor, falling to the right. Slight cough. Has been eating normally at home. No fever. No change in bowels or bladder. W/u in ER including head CT non-diagnostic. Family unable to care for patient in this condition. She is now admitted. Previously pt was very independent. She did suffer 1 fall in the last 12 months. Occurred in January 2017 with 3 R sided rib fractures. History obtained from daughters as pt is AOx1 for first name only at time of evaluation    PT Comments    Pt is making small improvements towards goals. Upon arrival, pt with heavy R side leaning, however after performing there-ex, posture improved and pt able to sit with trunk at midline. Pt positioned with pillows to prevent fatigue towards R side. Good endurance with there-ex this date with increased reps performed. Increased standing trials completed, however pt needs + 2 assist for safety and for correct use of AD. Poor balance noted during standing, not safe for ambulation at this time. Pt still limited by cognition.  Follow Up Recommendations  SNF     Equipment Recommendations  None recommended by PT    Recommendations for Other Services       Precautions / Restrictions Precautions Precautions: Fall Restrictions Weight Bearing Restrictions: No    Mobility  Bed Mobility               General bed mobility comments: Pt received sitting in chair, bed mobility not performed  Transfers Overall transfer level: Needs assistance Equipment used: Rolling walker (2 wheeled) Transfers: Sit to/from  Stand Sit to Stand: Mod assist         General transfer comment: Pt performed sit<>stand x 5 attempts using rw with +2 assist. Pt demonstrates post leaning noted upon standing and has difficulty with weight shifting to midline. Pt with couched position, unable to extend knees/hips without cues. During each standing attempt, pt able to stand for approx 10-20 seconds before fatigue.  Ambulation/Gait             General Gait Details: Not safe at this time. Pt able to take march in place however due to loss of balance, not able to safely ambulate.   Stairs            Wheelchair Mobility    Modified Rankin (Stroke Patients Only)       Balance                                    Cognition Arousal/Alertness: Awake/alert Behavior During Therapy: Flat affect Overall Cognitive Status: Impaired/Different from baseline Area of Impairment: Orientation Orientation Level: Disoriented to;Place;Time;Situation                  Exercises Other Exercises Other Exercises: Pt performed seated ther-ex including B LE hip abd/add, heel slides, SLRs,  and ankle pumps. All ther-ex performed x 12 reps with min/mod assist and cues for correct technique. Pt also performed trunk rotation towards L side to promote weightshifting towards L x 10 reps. After  performance, pt with improve sitting posture.    General Comments        Pertinent Vitals/Pain Pain Assessment: No/denies pain    Home Living                      Prior Function            PT Goals (current goals can now be found in the care plan section) Acute Rehab PT Goals Patient Stated Goal: family would like patient to return to prior function PT Goal Formulation: With family Time For Goal Achievement: 12/31/15 Potential to Achieve Goals: Fair Progress towards PT goals: Progressing toward goals    Frequency  Min 2X/week    PT Plan Current plan remains appropriate    Co-evaluation              End of Session Equipment Utilized During Treatment: Gait belt Activity Tolerance: Patient tolerated treatment well Patient left: in chair;with chair alarm set;with family/visitor present     Time: 1610-9604 PT Time Calculation (min) (ACUTE ONLY): 23 min  Charges:  $Therapeutic Exercise: 8-22 mins $Therapeutic Activity: 8-22 mins                    G Codes:      Ashle Stief 01/08/16, 11:59 AM  Elizabeth Palau, PT, DPT [phone]

## 2015-12-21 NOTE — Care Management (Signed)
RNCM assessment was completed.  [facility].  CSW following.  RNCM signing off

## 2015-12-22 LAB — URINALYSIS COMPLETE WITH MICROSCOPIC (ARMC ONLY)
Bacteria, UA: NONE SEEN
Bilirubin Urine: NEGATIVE
Glucose, UA: NEGATIVE mg/dL
Hgb urine dipstick: NEGATIVE
Ketones, ur: NEGATIVE mg/dL
Leukocytes, UA: NEGATIVE
Nitrite: NEGATIVE
Protein, ur: NEGATIVE mg/dL
Specific Gravity, Urine: 1.021 (ref 1.005–1.030)
pH: 5 (ref 5.0–8.0)

## 2015-12-22 MED ORDER — VANCOMYCIN HCL IN DEXTROSE 750-5 MG/150ML-% IV SOLN
750.0000 mg | INTRAVENOUS | Status: DC
  Administered 2015-12-22 – 2015-12-23 (×2): 750 mg via INTRAVENOUS
  Filled 2015-12-22 (×3): qty 150

## 2015-12-22 NOTE — Progress Notes (Signed)
[name] - [name] at [name]   PATIENT NAME: [name]    MR#:  [id number]  DATE OF BIRTH:  1923-05-13   She is alert awake oriented. Daughter at bedside. Complains of shaking worsened at night lasted about 1-2 minutes. So she is concerned about that.   CHIEF COMPLAINT:   Chief Complaint  Patient presents with  . Altered Mental Status  . Weakness   Admitted for pneumonia. Patient is aseen by neurology. MRI of the brain negative for acute stroke.    REVIEW OF SYSTEMS:    Review of Systems  Constitutional: Negative for fever and chills.  HENT: Negative for hearing loss.   Eyes: Negative for blurred vision, double vision and photophobia.  Respiratory: Negative for cough, hemoptysis and shortness of breath.   Cardiovascular: Negative for palpitations, orthopnea and leg swelling.  Gastrointestinal: Negative for vomiting, abdominal pain and diarrhea.  Genitourinary: Negative for dysuria and urgency.  Musculoskeletal: Negative for myalgias and neck pain.  Skin: Negative for rash.  Neurological: Negative for dizziness, sensory change, focal weakness, seizures, weakness and headaches.  Psychiatric/Behavioral: Negative for memory loss. The patient does not have insomnia.     Nutrition:npo Tolerating Diet: Tolerating PT:      DRUG ALLERGIES:   Allergies  Allergen Reactions  . Phenergan [Promethazine Hcl] Other (See Comments)    Family states the patient almost went into a coma.  . Sulfa Antibiotics Other (See Comments)    Unknown reaction    VITALS:  Blood pressure 142/72, pulse 77, temperature 97.3 F (36.3 C), temperature source Oral, resp. rate 22, height  (1.549 m), weight 44.77 kg (98 lb 11.2 oz), SpO2 99 %.  PHYSICAL EXAMINATION:   Physical Exam  GENERAL:  82 y.o.-year-old patient lying in the bed with no acute distress.  EYES: Pupils equal, round, reactive to light and accommodation. No scleral icterus. Extraocular muscles  intact.  HEENT: Head atraumatic, normocephalic. Oropharynx and nasopharynx clear.  NECK:  Supple, no jugular venous distention. No thyroid enlargement, no tenderness.  LUNGS: Normal breath sounds bilaterally, no wheezing, rales,rhonchi or crepitation. No use of accessory muscles of respiration.  CARDIOVASCULAR: S1, S2 normal. No murmurs, rubs, or gallops.  ABDOMEN: Soft, nontender, nondistended. Bowel sounds present. No organomegaly or mass.  EXTREMITIES: No pedal edema, cyanosis, or clubbing.  NEUROLOGIC: Patient has no focal neurological deficit.. Continues tremors noted in hands. Psychiatric./: The patient is alert and awake. N: No obvious rash, lesion, or ulcer.    LABORATORY PANEL:   CBC  Recent Labs Lab 12/21/15 1457  WBC 11.7*  HGB 13.7  HCT 41.2  PLT 218   ------------------------------------------------------------------------------------------------------------------  Chemistries   Recent Labs Lab 12/17/15 0509  12/21/15 1457  NA 140  --  135  K 4.0  --  4.1  CL 111  --  105  CO2 24  --  24  GLUCOSE 107*  --  129*  BUN 18  --  30*  CREATININE 0.70  < > 0.80  CALCIUM 9.1  --  9.4  AST 30  --   --   ALT 13*  --   --   ALKPHOS 61  --   --   BILITOT 1.3*  --   --   < > = values in this interval not displayed. ------------------------------------------------------------------------------------------------------------------  Cardiac Enzymes  Recent Labs Lab 12/16/15 1419  TROPONINI <0.03   ------------------------------------------------------------------------------------------------------------------  RADIOLOGY:  Dg Chest 1 View  12/21/2015  CLINICAL DATA:  Elevated white  count. EXAM: CHEST 1 VIEW COMPARISON:  CT chest 12/16/2015. FINDINGS: The heart is enlarged. The aorta is tortuous and calcified. Patchy opacity at the RIGHT base appears increased from prior CT, possibly representing worsening pneumonia. IMPRESSION: Cardiomegaly. Slight worsening  aeration. Increasing patchy opacity at the RIGHT base suggesting pneumonia. Electronically Signed   By: Tina StainJohn T [name] [name].   On: 12/21/2015 17:08     ASSESSMENT AND PLAN:   Principal Problem:   Altered mental status Active Problems:   Parkinson's disease (HCC)   Dysphagia   Abnormal CXR   Pneumonia   1.altered mental status likely secondary to aspiration pneumonia: Patient antibiotics were changed to vancomycin and Zosyn yesterday. X-ray of the chest showed worsening of pneumonia. To daughter that she needs to be on strict aspiration precautions even after discharge otherwise things can recur again. I also spoke with speech therapist today.  2.Metabolic encephalopathy  Due to pneumonia; improved.  #3. Parkinson disease:: Seen by neurology. MRI of the brain negative for stroke. Patient is right now on long-acting Sinemet.  No Further neurological workup is warranted. Dosing of Sinemet should be twice a day Worsening of tremors at night: I told that we will do overnight oximetry to evaluate for any hypoxia episodes. If they are normal I think it's a progression of her Parkinson's disease, advanced age.  4.Dysphagia ; seen by speech therapy. Continue dysphagia diet. Continue strict aspiration precautions  #5 deconditioning ;PT recommends SNF.  Lik[facility] rehabilitation on Monday. More than 50% time spent in counseling and coordination of care.   All the records are reviewed and case discussed with Care Management/Social Workerr. Management plans discussed with the patient, family and they are in agreement.  CODE STATUS: full  TOTAL TIME TAKING CARE OF THIS PATIENT:35  POSSIBLE D/C IN 1-2  DAYS, DEPENDING ON CLINICAL CONDITION.   Katha HammingKONIDENA,Lateefah Mallery [name] on 12/22/2015 at 11:07 AM  Between 7am to 6pm - Pager - [phone]  After 6pm go to [email or web address] - password [email or web address] [name]  [facility] [place] [facility]  Office  [phone]  CC: Primary care physician;  [name]

## 2015-12-22 NOTE — Plan of Care (Signed)
Problem: Activity: Goal: Risk for activity intolerance will decrease Outcome: Progressing Pt was put in chair twice by nursing staff

## 2015-12-23 NOTE — Progress Notes (Signed)
Spoke with Dr. Sheryle Hailiamond about pt's BP - first two times BP was taken the diastolic was mid 16'X30's. Changed cuff to other arm and got diastolic of 51. Asking if we should hold this AMs dose of hydralazine - order given to hold this dose.

## 2015-12-23 NOTE — Progress Notes (Signed)
[facility] - [place] at [facility]   PATIENT NAME: [name]    MR#:  [id number]  DATE OF BIRTH:  1923/06/18   She is alert awake oriented. Daughter at bedside. Happy about her progress, says that the mom is improving and they're looking forward for discharge.   CHIEF COMPLAINT:   Chief Complaint  Patient presents with  . Altered Mental Status  . Weakness   Admitted for pneumonia. Patient is aseen by neurology. MRI of the brain negative for acute stroke.    REVIEW OF SYSTEMS:    Review of Systems  Constitutional: Negative for fever and chills.  HENT: Negative for hearing loss.   Eyes: Negative for blurred vision, double vision and photophobia.  Respiratory: Negative for cough, hemoptysis and shortness of breath.   Cardiovascular: Negative for palpitations, orthopnea and leg swelling.  Gastrointestinal: Negative for vomiting, abdominal pain and diarrhea.  Genitourinary: Negative for dysuria and urgency.  Musculoskeletal: Negative for myalgias and neck pain.  Skin: Negative for rash.  Neurological: Negative for dizziness, sensory change, focal weakness, seizures, weakness and headaches.  Psychiatric/Behavioral: Negative for memory loss. The patient does not have insomnia.     Nutrition:npo Tolerating Diet: Tolerating PT:      DRUG ALLERGIES:   Allergies  Allergen Reactions  . Phenergan [Promethazine Hcl] Other (See Comments)    Family states the patient almost went into a coma.  . Sulfa Antibiotics Other (See Comments)    Unknown reaction    VITALS:  Blood pressure 129/51, pulse 70, temperature 97.9 F (36.6 C), temperature source Oral, resp. rate 17, height  (1.549 m), weight 44.679 kg (98 lb 8 oz), SpO2 100 %.  PHYSICAL EXAMINATION:   Physical Exam  GENERAL:  82 y.o.-year-old patient lying in the bed with no acute distress.  EYES: Pupils equal, round, reactive to light and accommodation. No scleral icterus. Extraocular  muscles intact.  HEENT: Head atraumatic, normocephalic. Oropharynx and nasopharynx clear.  NECK:  Supple, no jugular venous distention. No thyroid enlargement, no tenderness.  LUNGS: Normal breath sounds bilaterally, no wheezing, rales,rhonchi or crepitation. No use of accessory muscles of respiration.  CARDIOVASCULAR: S1, S2 normal. No murmurs, rubs, or gallops.  ABDOMEN: Soft, nontender, nondistended. Bowel sounds present. No organomegaly or mass.  EXTREMITIES: No pedal edema, cyanosis, or clubbing.  NEUROLOGIC: Patient has no focal neurological deficit.. Continues tremors noted in hands. Psychiatric./: The patient is alert and awake. N: No obvious rash, lesion, or ulcer.    LABORATORY PANEL:   CBC  Recent Labs Lab 12/21/15 1457  WBC 11.7*  HGB 13.7  HCT 41.2  PLT 218   ------------------------------------------------------------------------------------------------------------------  Chemistries   Recent Labs Lab 12/17/15 0509  12/21/15 1457  NA 140  --  135  K 4.0  --  4.1  CL 111  --  105  CO2 24  --  24  GLUCOSE 107*  --  129*  BUN 18  --  30*  CREATININE 0.70  < > 0.80  CALCIUM 9.1  --  9.4  AST 30  --   --   ALT 13*  --   --   ALKPHOS 61  --   --   BILITOT 1.3*  --   --   < > = values in this interval not displayed. ------------------------------------------------------------------------------------------------------------------  Cardiac Enzymes  Recent Labs Lab 12/16/15 1419  TROPONINI <0.03   ------------------------------------------------------------------------------------------------------------------  RADIOLOGY:  Dg Chest 1 View  12/21/2015  CLINICAL DATA:  Elevated white  count. EXAM: CHEST 1 VIEW COMPARISON:  CT chest 12/16/2015. FINDINGS: The heart is enlarged. The aorta is tortuous and calcified. Patchy opacity at the RIGHT base appears increased from prior CT, possibly representing worsening pneumonia. IMPRESSION: Cardiomegaly. Slight  worsening aeration. Increasing patchy opacity at the RIGHT base suggesting pneumonia. Electronically Signed   By: Elsie StainJohn T Curnes M.D.   On: 12/21/2015 17:08     ASSESSMENT AND PLAN:   Principal Problem:   Altered mental status Active Problems:   Parkinson's disease (HCC)   Dysphagia   Abnormal CXR   Pneumonia   1.altered mental status likely secondary to aspiration pneumonia: abx are changed to vancomycin and Zosyn , continue them for next 24-48 hours and then discharge her to rehabilitation. Discussed this with patient's daughter. Explained the To daughter that she needs to be on strict aspiration precautions even after discharge otherwise things can recur again. I also spoke with speech therapist today.  2.Metabolic encephalopathy  Due to pneumonia; improved.  #3. Parkinson disease:: Seen by neurology. MRI of the brain negative for stroke. Patient is right now on long-acting Sinemet.  No Further neurological workup is warranted. Dosing of Sinemet should be twice a day Worsening of tremors at night: I told that we will do overnight oximetry to evaluate for any hypoxia episodes. If they are normal I think it's a progression of her Parkinson's disease, advanced age.  4.Dysphagia ; seen by speech therapy. Continue dysphagia diet. Continue strict aspiration precautions  #5 deconditioning ;PT recommends SNF.  Lik[facility] on Monday. More than 50% time spent in counseling and coordination of care.   All the records are reviewed and case discussed with Care Management/Social Workerr. Management plans discussed with the patient, family and they are in agreement.  CODE STATUS: full  TOTAL TIME TAKING CARE OF THIS PATIENT: 20 min  POSSIBLE D/C IN 1-2  DAYS, DEPENDING ON CLINICAL CONDITION.   Katha HammingKONIDENA,Gracielynn Birkel M.D on 12/23/2015 at 11:06 AM  Between 7am to 6pm - Pager - [phone]  After 6pm go to [email or web address] - password [email or web address] [facility]  [place] [place]  [facility]  Office  [phone]  CC: Primary care physician; Vonita MossMark Crissman, MD

## 2015-12-23 NOTE — Progress Notes (Signed)
Patient has orders for metoprolol and hydralazine.  B/P noted at 123/44 and HR 70. Called Dr. Luberta MutterKonidena to notify her of the above and request parameters for administering the medications.  Orders received to D/C the hydralazine. No parameters received for the metoprolol. Will continue to monitor.

## 2015-12-24 ENCOUNTER — Inpatient Hospital Stay: Payer: Medicare Other

## 2015-12-24 LAB — CBC
HCT: 34.6 % — ABNORMAL LOW (ref 35.0–47.0)
Hemoglobin: 11.7 g/dL — ABNORMAL LOW (ref 12.0–16.0)
MCH: 31.3 pg (ref 26.0–34.0)
MCHC: 33.8 g/dL (ref 32.0–36.0)
MCV: 92.6 fL (ref 80.0–100.0)
Platelets: 206 10*3/uL (ref 150–440)
RBC: 3.74 MIL/uL — ABNORMAL LOW (ref 3.80–5.20)
RDW: 13.5 % (ref 11.5–14.5)
WBC: 5.3 10*3/uL (ref 3.6–11.0)

## 2015-12-24 LAB — CREATININE, SERUM
Creatinine, Ser: 0.78 mg/dL (ref 0.44–1.00)
GFR calc Af Amer: >60 mL/min (ref >60)
GFR calc non Af Amer: >60 mL/min (ref >60)

## 2015-12-24 MED ORDER — AMOXICILLIN-POT CLAVULANATE 875-125 MG PO TABS: 1.0000 | ORAL_TABLET | Freq: Two times a day (BID) | ORAL | Status: DC

## 2015-12-24 NOTE — Progress Notes (Addendum)
Physical Therapy Treatment Patient Details Name: [name] MRN: [id number] DOB: 03/27/1923 Today's Date: 12/24/2015    History of Present Illness [name] is a 82 y.o. female has a past medical history significant for Parkinson's and HTN who lives with her husband at home. Has been having worsening issues with her Parkinson's and her meds have been adjusted recently. Presents now with worsening confusion and tremor, falling to the right. Slight cough. Has been eating normally at home. No fever. No change in bowels or bladder. W/u in ER including head CT non-diagnostic. Family unable to care for patient in this condition. She is now admitted. Previously pt was very independent. She did suffer 1 fall in the last 12 months. Occurred in January 2017 with 3 R sided rib fractures. History obtained from daughters as pt is AOx1 for first name only at time of evaluation    PT Comments    Pt agreeable to PT; up in chair. Pt with improved sitting balance today at edge of chair. Difficulty with stand; unable to attain full upright stand and retropulsive.requiring Mod to Max A. Pt requests need to use the commode with total assist needed for personal hygiene. Pt does demonstrate several unsteady steps chair to commode with Mod A and assist of another for safety/equipment. Pt also requires bed brought to pt post toileting, as pt unable to continue ambulation to the bed. Pt needed back to bed for transport to radiology. Max A of 2 for all bed mobility. Continue PT to progress strength, posture, balance, transfers and gait with attempts to return to prior level of function.    Follow Up Recommendations  SNF     Equipment Recommendations  None recommended by PT    Recommendations for Other Services       Precautions / Restrictions Precautions Precautions: Fall Restrictions Weight Bearing Restrictions: No    Mobility  Bed Mobility Overal bed mobility: Needs Assistance Bed Mobility: Sit to  Supine       Sit to supine: Max assist;+2 for physical assistance   General bed mobility comments: Requires heavy/consistent cueing/instruction to complete task. Max A to elevate LEs to bed; second to guide trunk. +2 to repostition upward in bed and for rolling  Transfers Overall transfer level: Needs assistance Equipment used: Rolling walker (2 wheeled) Transfers: Sit to/from Stand Sit to Stand: Mod assist;Max assist;+2 safety/equipment         General transfer comment: Unable to attain full upright stand at knees, hips and trunk. Retropulsive. Safety cues for hand placement  Ambulation/Gait Ambulation/Gait assistance: Mod assist Ambulation Distance (Feet): 3 Feet (chair to bedside commode) Assistive device: Rolling walker (2 wheeled) Gait Pattern/deviations: Step-to pattern;Decreased step length - right;Decreased step length - left;Trunk flexed;Narrow base of support (small, unsure steps) Gait velocity: slow Gait velocity interpretation: <1.8 ft/sec, indicative of risk for recurrent falls General Gait Details: Requires Mod A to maintain balance; retropulsive. Small unsteady/unsure steps chair to commode. post toileting requires bed brougth to pt, as unable to take steps back to bed   Stairs            Wheelchair Mobility    Modified Rankin (Stroke Patients Only)       Balance Overall balance assessment: Needs assistance Sitting-balance support: Feet supported;No upper extremity supported Sitting balance-Leahy Scale: Fair Sitting balance - Comments: Improved; able to maintain with UE/LE contact. Forward flexed trunk   Standing balance support: Bilateral upper extremity supported Standing balance-Leahy Scale: Poor Standing balance comment: Requies Mod  to Max A of 1 to maintain partial upright posture                    Cognition Arousal/Alertness: Awake/alert Behavior During Therapy: WFL for tasks assessed/performed Overall Cognitive Status: History of  cognitive impairments - at baseline                      Exercises Other Exercises Other Exercises: Assist for toiileting and total assist for personal hygiene    General Comments        Pertinent Vitals/Pain Pain Assessment: No/denies pain    Home Living                      Prior Function            PT Goals (current goals can now be found in the care plan section) Progress towards PT goals: Progressing toward goals    Frequency  Min 2X/week    PT Plan Current plan remains appropriate    Co-evaluation             End of Session Equipment Utilized During Treatment: Gait belt Activity Tolerance: Patient limited by fatigue (limited by weakness) Patient left: in bed (taken to radiology for chest xray)     Time: 1610-9604 PT Time Calculation (min) (ACUTE ONLY): 34 min  Charges:  $Gait Training: 8-22 mins $Therapeutic Activity: 8-22 mins                    G Codes:      Kristeen Miss, PTA 12/24/2015, 11:32 AM

## 2015-12-24 NOTE — Discharge Instructions (Signed)
Dysphagia 3 Diet- Thin liquids. Needs assistance with meals. aspiration precautions Add Gravy to Meats please.  Soups in Cups.

## 2015-12-24 NOTE — Clinical Social Work Note (Signed)
Patient to discharge [facility]. Kim at [facility] is aware and patient's daughter is aware and will transport her today. Discharge information sent. Nurse to call report.  York SpanielMonica Kennisha Qin MSW,LCSW [phone]

## 2015-12-24 NOTE — Progress Notes (Signed)
Report given to HollandaleValerie at [facility]. Family will transport pt.

## 2015-12-24 NOTE — Discharge Summary (Signed)
[facility] [place] [facility]   PATIENT NAME: [name]    MR#:  [id number]  DATE OF BIRTH:  1923/04/11  DATE OF ADMISSION:  12/16/2015 ADMITTING PHYSICIAN: Marguarite Arbour, MD  DATE OF DISCHARGE: 12/24/15  PRIMARY CARE PHYSICIAN: Vonita Moss, MD    ADMISSION DIAGNOSIS:  Dysphagia [R13.10] Altered mental status, unspecified altered mental status type [R41.82]  DISCHARGE DIAGNOSIS:  Principal Problem:   Altered mental status Active Problems:   Parkinson's disease (HCC)   Dysphagia   Abnormal CXR   Pneumonia   SECONDARY DIAGNOSIS:   Past Medical History  Diagnosis Date  . Anemia   . Arthritis   . Hypertension   . Tremor   . B12 deficiency     HOSPITAL COURSE:   82 year old female with past medical history significant for hypertension and Parkinson's disease admitted for altered mental status.  #1 altered mental status-likely metabolic encephalopathy secondary to aspiration pneumonia. -Patient was on vancomycin and Zosyn -Repeat chest x-ray with no worsening. Patient is on room air. -Mental status is back to baseline. -Appreciate speech therapy consult. Started on dysphagia 3 diet with thin liquids. Aspiration precautions and assistance with feeding needed. -Will be discharged on Augmentin. White count normalized.  #2 Parkinson's disease-seen by neurology, MRI of the brain is negative for acute infarct. -Started on long-acting Sinemet twice a day. Resting tremors at night have improved. -Outpatient neurology follow-up recommended as it could be progression of her Parkinson's disease with advanced age.  #3 hypertension-continue metoprolol and hydralazine orally.  #4 weakness-worked with physical therapy and they have recommended rehabilitation  Patient will be discharged to [facility] rehabilitation today  DISCHARGE CONDITIONS:   Guarded  CONSULTS OBTAINED:  Treatment Team:  Kym Groom, MD  DRUG ALLERGIES:    Allergies  Allergen Reactions  . Phenergan [Promethazine Hcl] Other (See Comments)    Family states the patient almost went into a coma.  . Sulfa Antibiotics Other (See Comments)    Unknown reaction    DISCHARGE MEDICATIONS:   Current Discharge Medication List    START taking these medications   Details  amoxicillin-clavulanate (AUGMENTIN) 875-125 MG tablet Take 1 tablet by mouth 2 (two) times daily. X 6 days Qty: 12 tablet, Refills: 0    hydrALAZINE (APRESOLINE) 25 MG tablet Take 1 tablet (25 mg total) by mouth every 8 (eight) hours. Qty: 30 tablet, Refills: 0      CONTINUE these medications which have CHANGED   Details  Carbidopa-Levodopa ER (SINEMET CR) 25-100 MG tablet controlled release Take 1 tablet by mouth 2 (two) times daily. Qty: 60 tablet, Refills: 0      CONTINUE these medications which have NOT CHANGED   Details  aspirin EC 81 MG tablet Take 81 mg by mouth daily.    Calcium Carbonate (CALCIUM-CARB 600 PO) Take 1,200 mg by mouth every morning.    cyanocobalamin (,VITAMIN B-12,) 1000 MCG/ML injection injected into the muscle 1x per week for 4 weeks, then monthly for 6 months. Qty: 30 mL, Refills: 0    metoprolol succinate (TOPROL-XL) 50 MG 24 hr tablet TAKE 1 TABLET BY MOUTH ONCE EVERY DAY Qty: 30 tablet, Refills: 3    Multiple Vitamins-Minerals (MULTIVITAMIN ADULT PO) Take 1 tablet by mouth daily.       STOP taking these medications     carbidopa-levodopa (SINEMET IR) 25-100 MG tablet          DISCHARGE INSTRUCTIONS:   1. PCP f/u in 1-2 weeks 2.  Physical Therapy 3. Neurology f/u in 2 weeks   If you experience worsening of your admission symptoms, develop shortness of breath, life threatening emergency, suicidal or homicidal thoughts you must seek medical attention immediately by calling 911 or calling your MD immediately  if symptoms less severe.  You Must read complete instructions/literature along with all the possible adverse  reactions/side effects for all the Medicines you take and that have been prescribed to you. Take any new Medicines after you have completely understood and accept all the possible adverse reactions/side effects.   Please note  You were cared for by a hospitalist during your hospital stay. If you have any questions about your discharge medications or the care you received while you were in the hospital after you are discharged, you can call the unit and asked to speak with the hospitalist on call if the hospitalist that took care of you is not available. Once you are discharged, your primary care physician will handle any further medical issues. Please note that NO REFILLS for any discharge medications will be authorized once you are discharged, as it is imperative that you return to your primary care physician (or establish a relationship with a primary care physician if you do not have one) for your aftercare needs so that they can reassess your need for medications and monitor your lab values.    Today   CHIEF COMPLAINT:   Chief Complaint  Patient presents with  . Altered Mental Status  . Weakness    VITAL SIGNS:  Blood pressure 117/46, pulse 59, temperature 98.1 F (36.7 C), temperature source Oral, resp. rate 16, height  (1.549 m), weight 45.859 kg (101 lb 1.6 oz), SpO2 99 %.  I/O:   Intake/Output Summary (Last 24 hours) at 12/24/15 1230 Last data filed at 12/24/15 0941  Gross per 24 hour  Intake    240 ml  Output    750 ml  Net   -510 ml    PHYSICAL EXAMINATION:   Physical Exam  GENERAL: 82 y.o.-old patient lying in the bed with no acute distress. significant resting tremors of the hands noted. EYES: Pupils equal, round, reactive to light and accommodation. No scleral icterus. Extraocular muscles intact.  HEENT: Head atraumatic, normocephalic. Oropharynx and nasopharynx clear.  NECK: Supple, no jugular venous distention. No thyroid enlargement, no tenderness.   LUNGS: Normal breath sounds bilaterally, no wheezing, rales,rhonchi or crepitation. No use of accessory muscles of respiration.  CARDIOVASCULAR: S1, S2 normal. No rubs, or gallops. 3/6 systolic murmur noted. ABDOMEN: Soft, nontender, nondistended. Bowel sounds present. No organomegaly or mass.  EXTREMITIES: No pedal edema, cyanosis, or clubbing.  NEUROLOGIC: Patient has no focal neurological deficit.. Continues tremors noted in hands. Psychiatric: The patient is alert and awake. SKIN: No obvious rash, lesion, or ulcer.   DATA REVIEW:   CBC  Recent Labs Lab 12/24/15 0510  WBC 5.3  HGB 11.7*  HCT 34.6*  PLT 206    Chemistries   Recent Labs Lab 12/21/15 1457 12/24/15 0510  NA 135  --   K 4.1  --   CL 105  --   CO2 24  --   GLUCOSE 129*  --   BUN 30*  --   CREATININE 0.80 0.78  CALCIUM 9.4  --     Cardiac Enzymes No results for input(s): TROPONINI in the last 168 hours.  Microbiology Results  Results for orders placed or performed in visit on 12/12/15  Microscopic Examination  Status: None   Collection Time: 12/12/15 11:10 AM  Result Value Ref Range Status   WBC, UA 0-5 0 -  5 /hpf Final   RBC, UA 0-2 0 -  2 /hpf Final   Epithelial Cells (non renal) 0-10 0 - 10 /hpf Final   Mucus, UA Present Not Estab. Final   Bacteria, UA Few None seen/Few Final  Urine culture     Status: None   Collection Time: 12/12/15 11:31 AM  Result Value Ref Range Status   Urine Culture, Routine Final report  Final   Urine Culture result 1 Comment  Final    Comment: Culture shows less than 10,000 colony forming units of bacteria per milliliter of urine. This colony count is not generally considered to be clinically significant.     RADIOLOGY:  No results found.  EKG:   Orders placed or performed during the hospital encounter of 12/16/15  . ED EKG  . ED EKG  . EKG 12-Lead  . EKG 12-Lead      Management plans discussed with the patient, family and they are in  agreement.  CODE STATUS:     Code Status Orders        Start     Ordered   12/16/15 1947  Full code   Continuous     12/16/15 1946    Code Status History    Date Active Date Inactive Code Status Order ID Comments User Context   This patient has a current code status but no historical code status.    Advance Directive Documentation        Most Recent Value   Type of Advance Directive  Healthcare Power of Attorney   Pre-existing out of facility DNR order (yellow form or pink MOST form)     "MOST" Form in Place?        TOTAL TIME TAKING CARE OF THIS PATIENT: 38 minutes.    Enid BaasKALISETTI,Charletha Dalpe M.D on 12/24/2015 at 12:30 PM  Between 7am to 6pm - Pager - [phone]  After 6pm go to [email or web address] - password [email or web address] [facility]  [facility]  Office  [phone]  CC: Primary care physician; Vonita MossMark Crissman, MD

## 2015-12-24 NOTE — Clinical Social Work Note (Signed)
Kim at [facility] has completed her pasrr this afternoon. York SpanielMonica Mikea Quadros MSW,LCSW [phone]

## 2015-12-26 ENCOUNTER — Ambulatory Visit: Payer: Medicare Other | Admitting: Family Medicine

## 2015-12-29 ENCOUNTER — Encounter
Admission: RE | Admit: 2015-12-29 | Discharge: 2015-12-29 | Disposition: A | Discharge status: Home / Self Care | Payer: Medicare Other | Source: Ambulatory Visit | Attending: Internal Medicine | Admitting: Internal Medicine

## 2015-12-29 DIAGNOSIS — R4182 Altered mental status, unspecified: Secondary | ICD-10-CM | POA: Insufficient documentation

## 2016-01-02 ENCOUNTER — Ambulatory Visit: Payer: Medicare Other | Admitting: Family Medicine

## 2016-01-09 ENCOUNTER — Ambulatory Visit: Payer: Medicare Other | Admitting: Family Medicine

## 2016-01-13 DIAGNOSIS — R4182 Altered mental status, unspecified: Secondary | ICD-10-CM | POA: Diagnosis not present

## 2016-01-13 LAB — URINALYSIS COMPLETE WITH MICROSCOPIC (ARMC ONLY)
Bilirubin Urine: NEGATIVE
Glucose, UA: NEGATIVE mg/dL
Hgb urine dipstick: NEGATIVE
Leukocytes, UA: NEGATIVE
Nitrite: NEGATIVE
Protein, ur: NEGATIVE mg/dL
RBC / HPF: NONE SEEN RBC/hpf (ref 0–5)
Specific Gravity, Urine: 1.021 (ref 1.005–1.030)
pH: 6 (ref 5.0–8.0)

## 2016-01-16 LAB — URINE CULTURE: Culture: 50000 — AB

## 2016-02-11 ENCOUNTER — Encounter: Payer: Self-pay | Admitting: Family Medicine

## 2016-02-11 ENCOUNTER — Ambulatory Visit (INDEPENDENT_AMBULATORY_CARE_PROVIDER_SITE_OTHER): Payer: Medicare Other | Admitting: Family Medicine

## 2016-02-11 VITALS — BP 106/58 | HR 75 | Temp 97.7°F

## 2016-02-11 DIAGNOSIS — R300 Dysuria: Secondary | ICD-10-CM | POA: Diagnosis not present

## 2016-02-11 DIAGNOSIS — R197 Diarrhea, unspecified: Secondary | ICD-10-CM | POA: Diagnosis not present

## 2016-02-11 DIAGNOSIS — R410 Disorientation, unspecified: Secondary | ICD-10-CM

## 2016-02-11 LAB — URINALYSIS, ROUTINE W REFLEX MICROSCOPIC
Bilirubin, UA: NEGATIVE
Glucose, UA: NEGATIVE
Leukocytes, UA: NEGATIVE
Nitrite, UA: NEGATIVE
RBC, UA: NEGATIVE
Specific Gravity, UA: 1.02 (ref 1.005–1.030)
Urobilinogen, Ur: 0.2 mg/dL (ref 0.2–1.0)
pH, UA: 5.5 (ref 5.0–7.5)

## 2016-02-11 LAB — MICROSCOPIC EXAMINATION

## 2016-02-11 NOTE — Assessment & Plan Note (Signed)
Altered mental status unknown cause progression of symptoms according to daughter will observe for change increase hydration fluids as patient can tolerate

## 2016-02-11 NOTE — Progress Notes (Signed)
BP 106/58 mmHg  Pulse 75  Temp(Src) 97.7 F (36.5 C)  Wt   SpO2 95%   Subjective:    Patient ID: [name], female    DOB: 1922-11-26, 82 y.o.   MRN: [id number]  HPI: [name] is a 82 y.o. female  Chief Complaint  Patient presents with  . URI    has gotten worse over the weekend, no fever, some cough, some runny nose; recently had pneumonia so they want to be sure she doesn't develop it again.  Marland Kitchen Urinary Tract Infection    over the weekend she started having some behavior changes and wonders if that is back too  . Loose Stools    some diarrhea over the weekend, worries about possible cdiff since she's been on alot of antibiotics lately.  Patient accompanied by her daughter who provides all of the history is patient's largely unresponsive. Patient has been having loose stools over the last week which is gotten worse over the last 2-3 days no particular worse odor than usual no blood in stool or urine has had some increased confusion and decreased appetite No noticed frequency urgency dysuria but just seems to be worse and similar problems to but patient just was discharged from the hospital for with pneumonia and urinary tract infection just finished antibiotics a week or so ago.   Relevant past medical, surgical, family and social history reviewed and updated as indicated. Interim medical history since our last visit reviewed. Allergies and medications reviewed and updated.  Provided by daughter  Review of Systems  Constitutional: Negative for fever and chills.  Respiratory: Negative for apnea and cough.   Cardiovascular: Negative for chest pain and leg swelling.    Per HPI unless specifically indicated above     Objective:    BP 106/58 mmHg  Pulse 75  Temp(Src) 97.7 F (36.5 C)  Wt   SpO2 95%  Wt Readings from Last 3 Encounters:  12/24/15 101 lb 1.6 oz (45.859 kg)  12/12/15 102 lb (46.267 kg)  11/26/15 101 lb (45.813 kg)    Physical Exam   Constitutional: She is oriented to person, place, and time. She appears well-developed and well-nourished. No distress.  HENT:  Head: Normocephalic and atraumatic.  Right Ear: Hearing and external ear normal.  Left Ear: Hearing and external ear normal.  Nose: Nose normal.  Mouth/Throat: Oropharynx is clear and moist.  Eyes: Conjunctivae and lids are normal. Right eye exhibits no discharge. Left eye exhibits no discharge. No scleral icterus.  Cardiovascular: Regular rhythm.   Pulmonary/Chest: Effort normal and breath sounds normal. No respiratory distress.  Abdominal: Soft. She exhibits no distension. There is no tenderness. There is no rebound and no guarding.  Musculoskeletal: Normal range of motion.  Neurological: She is alert and oriented to person, place, and time.  Skin: Skin is intact. No rash noted.  Psychiatric: She has a normal mood and affect. Her speech is normal and behavior is normal. Judgment and thought content normal. Cognition and memory are normal.    Results for orders placed or performed during the hospital encounter of 12/29/15  Urine culture  Result Value Ref Range   Specimen Description URINE, RANDOM    Special Requests NONE    Culture (A)     50,000 COLONIES/mL ENTEROCOCCUS FAECALIS WITH MIXED BACTERIAL ORGANISMS    Report Status 01/16/2016 FINAL    Organism ID, Bacteria ENTEROCOCCUS FAECALIS (A)       Susceptibility   Enterococcus faecalis -  MIC*    AMPICILLIN <=2 SENSITIVE Sensitive     LEVOFLOXACIN 1 SENSITIVE Sensitive     NITROFURANTOIN <=16 SENSITIVE Sensitive     VANCOMYCIN 2 SENSITIVE Sensitive     LINEZOLID 2 SENSITIVE Sensitive     * 50,000 COLONIES/mL ENTEROCOCCUS FAECALIS  Urinalysis complete, with microscopic ([facility] only)  Result Value Ref Range   Color, Urine YELLOW (A) YELLOW   APPearance HAZY (A) CLEAR   Glucose, UA NEGATIVE NEGATIVE mg/dL   Bilirubin Urine NEGATIVE NEGATIVE   Ketones, ur TRACE (A) NEGATIVE mg/dL   Specific Gravity,  Urine 1.021 1.005 - 1.030   Hgb urine dipstick NEGATIVE NEGATIVE   pH 6.0 5.0 - 8.0   Protein, ur NEGATIVE NEGATIVE mg/dL   Nitrite NEGATIVE NEGATIVE   Leukocytes, UA NEGATIVE NEGATIVE   RBC / HPF NONE SEEN 0 - 5 RBC/hpf   WBC, UA 0-5 0 - 5 WBC/hpf   Bacteria, UA RARE (A) NONE SEEN   Squamous Epithelial / LPF 0-5 (A) NONE SEEN   Mucous PRESENT       Assessment & Plan:   Problem List Items Addressed This Visit      Nervous and Auditory   Confusion    Altered mental status unknown cause progression of symptoms according to daughter will observe for change increase hydration fluids as patient can tolerate        Other Visit Diagnoses    Dysuria    -  Primary    Relevant Orders    Urinalysis, Routine w reflex microscopic (not at [facility])    Diarrhea, unspecified type        Check stool for C. difficile, avoid milk and milk products increase foods may use Imodium after stool sample obtained    Relevant Orders    Stool C-Diff Toxin Assay        Follow up plan: Return for As scheduled.

## 2016-02-14 LAB — CLOSTRIDIUM DIFFICILE EIA: C difficile Toxins A+B, EIA: NEGATIVE

## 2016-02-18 ENCOUNTER — Ambulatory Visit (INDEPENDENT_AMBULATORY_CARE_PROVIDER_SITE_OTHER): Payer: Medicare Other | Admitting: Family Medicine

## 2016-02-18 ENCOUNTER — Other Ambulatory Visit: Payer: Self-pay | Admitting: Family Medicine

## 2016-02-18 ENCOUNTER — Encounter: Payer: Self-pay | Admitting: Family Medicine

## 2016-02-18 VITALS — BP 116/67 | HR 76

## 2016-02-18 DIAGNOSIS — G20 Parkinson's disease: Secondary | ICD-10-CM

## 2016-02-18 DIAGNOSIS — R197 Diarrhea, unspecified: Secondary | ICD-10-CM | POA: Diagnosis not present

## 2016-02-18 NOTE — Progress Notes (Signed)
BP 116/67 mmHg  Pulse 76  Ht   Wt   SpO2 95%   Subjective:    Patient ID: Elmarie MainlandEttalene W Reader, female    DOB: 1923/09/01, 82 y.o.   MRN: [id number]  HPI: Elmarie Mainlandttalene W Balthazor is a 82 y.o. female  Chief Complaint  Patient presents with  . Diarrhea    got better over weekend, "oatmeal color" took Immodium around 1o'clock  . "issue Friday"    BP very low, could not wake her up from nap  History provided by daughter and caregiver. Patient unable to respond. Diarrhea has been intermittent as noted above no blood in stool or urine Stools were black well taking Pepto-Bismol diarrhea was better unrestricted diet then back on an unrestricted diet as developed diarrhea. No fever chills nausea vomiting hasn't had much of an appetite. Weight checked about a month ago was 96 pounds doesn't seem to have lost any weight from that point. No other family members or caregivers with diarrhea. Or other bowel upset. No cough cold fever or chills Reviewed medication metoprolol is been on for years is listed as a possibility side effect for diarrhea nothing for carbidopa levodopa.   Relevant past medical, surgical, family and social history reviewed and updated as indicated. Interim medical history since our last visit reviewed. Allergies and medications reviewed and updated.  Review of Systems  Unable to perform ROS: Dementia    Per HPI unless specifically indicated above     Objective:    BP 116/67 mmHg  Pulse 76  Ht   Wt   SpO2 95%  Wt Readings from Last 3 Encounters:  12/24/15 101 lb 1.6 oz (45.859 kg)  12/12/15 102 lb (46.267 kg)  11/26/15 101 lb (45.813 kg)    Physical Exam  Constitutional: She is oriented to person, place, and time. She appears well-developed and well-nourished. No distress.  HENT:  Head: Normocephalic and atraumatic.  Right Ear: Hearing normal.  Left Ear: Hearing normal.  Nose: Nose normal.  Eyes: Conjunctivae and lids are normal. Right eye exhibits no discharge.  Left eye exhibits no discharge. No scleral icterus.  Cardiovascular: Normal rate, regular rhythm and normal heart sounds.   Pulmonary/Chest: Effort normal and breath sounds normal. No respiratory distress.  Abdominal: Soft. Bowel sounds are normal. She exhibits no distension and no mass. There is no tenderness. There is no rebound and no guarding.  Musculoskeletal: Normal range of motion.  Neurological: She is alert and oriented to person, place, and time.  Skin: Skin is intact. No rash noted.  Psychiatric: Her speech is normal and behavior is normal. Cognition and memory are normal.    Results for orders placed or performed in visit on 02/11/16  Stool C-Diff Toxin Assay  Result Value Ref Range   C difficile Toxins A+B, EIA Negative Negative  Microscopic Examination  Result Value Ref Range   WBC, UA 0-5 0 -  5 /hpf   RBC, UA 0-2 0 -  2 /hpf   Epithelial Cells (non renal) 0-10 0 - 10 /hpf   Crystals Present (A) N/A   Crystal Type Calcium Oxalate N/A   Mucus, UA Present Not Estab.   Bacteria, UA Few None seen/Few  Urinalysis, Routine w reflex microscopic (not at [facility])  Result Value Ref Range   Specific Gravity, UA 1.020 1.005 - 1.030   pH, UA 5.5 5.0 - 7.5   Color, UA Yellow Yellow   Appearance Ur Cloudy (A) Clear   Leukocytes, UA Negative Negative  Protein, UA 2+ (A) Negative/Trace   Glucose, UA Negative Negative   Ketones, UA Trace (A) Negative   RBC, UA Negative Negative   Bilirubin, UA Negative Negative   Urobilinogen, Ur 0.2 0.2 - 1.0 mg/dL   Nitrite, UA Negative Negative   Microscopic Examination See below:       Assessment & Plan:   Problem List Items Addressed This Visit      Nervous and Auditory   Parkinson's disease (HCC) - Primary    Patient stable but worsening dementia       Other Visit Diagnoses    Diarrhea, unspecified type        Relevant Orders    CBC with Differential/Platelet    Comprehensive metabolic panel    Urinalysis, Routine w reflex  microscopic (not at [facility])    Stool culture    Campylobacter culture, stool    Ova and Parasite Examination      Reviewed diarrhea care and treatment dietary restrictions with limited milk and milk products Reviewed and stool for clostridia was negative Will order stool for pathogens and parasites Will check CBC, CMP, urinalysis  Follow up plan: Return if symptoms worsen or fail to improve, for Pending diarrhea workup.

## 2016-02-18 NOTE — Assessment & Plan Note (Signed)
Addendum to note patient's ability to self-care is drastically reduced recommended home health to assist with assessment and proper use of resources

## 2016-02-18 NOTE — Assessment & Plan Note (Signed)
Patient stable but worsening dementia

## 2016-02-19 ENCOUNTER — Other Ambulatory Visit: Payer: Medicare Other

## 2016-02-19 DIAGNOSIS — R197 Diarrhea, unspecified: Secondary | ICD-10-CM

## 2016-02-19 LAB — URINALYSIS, ROUTINE W REFLEX MICROSCOPIC
Bilirubin, UA: NEGATIVE
Glucose, UA: NEGATIVE
Ketones, UA: NEGATIVE
Leukocytes, UA: NEGATIVE
Nitrite, UA: NEGATIVE
Protein, UA: NEGATIVE
RBC, UA: NEGATIVE
Specific Gravity, UA: 1.015 (ref 1.005–1.030)
Urobilinogen, Ur: 0.2 mg/dL (ref 0.2–1.0)
pH, UA: 7 (ref 5.0–7.5)

## 2016-02-20 ENCOUNTER — Telehealth: Payer: Self-pay | Admitting: Family Medicine

## 2016-02-20 LAB — CBC WITH DIFFERENTIAL/PLATELET
Basophils Absolute: 0 10*3/uL (ref 0.0–0.2)
Basos: 0 %
EOS (ABSOLUTE): 0.2 10*3/uL (ref 0.0–0.4)
Eos: 1 %
Hematocrit: 34.2 % (ref 34.0–46.6)
Hemoglobin: 11.6 g/dL (ref 11.1–15.9)
Immature Grans (Abs): 0.7 10*3/uL — ABNORMAL HIGH (ref 0.0–0.1)
Immature Granulocytes: 5 %
Lymphocytes Absolute: 1.5 10*3/uL (ref 0.7–3.1)
Lymphs: 11 %
MCH: 30.2 pg (ref 26.6–33.0)
MCHC: 33.9 g/dL (ref 31.5–35.7)
MCV: 89 fL (ref 79–97)
Monocytes Absolute: 1.2 10*3/uL — ABNORMAL HIGH (ref 0.1–0.9)
Monocytes: 9 %
Neutrophils Absolute: 9.9 10*3/uL — ABNORMAL HIGH (ref 1.4–7.0)
Neutrophils: 74 %
Platelets: 310 10*3/uL (ref 150–379)
RBC: 3.84 x10E6/uL (ref 3.77–5.28)
RDW: 13.1 % (ref 12.3–15.4)
WBC: 13.4 10*3/uL — ABNORMAL HIGH (ref 3.4–10.8)

## 2016-02-20 LAB — COMPREHENSIVE METABOLIC PANEL
ALT: 10 IU/L (ref 0–32)
AST: 18 IU/L (ref 0–40)
Albumin/Globulin Ratio: 1.4 (ref 1.2–2.2)
Albumin: 3.3 g/dL (ref 3.2–4.6)
Alkaline Phosphatase: 59 IU/L (ref 39–117)
BUN/Creatinine Ratio: 17 (ref 12–28)
BUN: 10 mg/dL (ref 10–36)
Bilirubin Total: 0.3 mg/dL (ref 0.0–1.2)
CO2: 25 mmol/L (ref 18–29)
Calcium: 9 mg/dL (ref 8.7–10.3)
Chloride: 100 mmol/L (ref 96–106)
Creatinine, Ser: 0.6 mg/dL (ref 0.57–1.00)
GFR calc Af Amer: 93 mL/min/{1.73_m2} (ref >59)
GFR calc non Af Amer: 81 mL/min/{1.73_m2} (ref >59)
Globulin, Total: 2.3 g/dL (ref 1.5–4.5)
Glucose: 103 mg/dL — ABNORMAL HIGH (ref 65–99)
Potassium: 3.9 mmol/L (ref 3.5–5.2)
Sodium: 140 mmol/L (ref 134–144)
Total Protein: 5.6 g/dL — ABNORMAL LOW (ref 6.0–8.5)

## 2016-02-20 MED ORDER — AMOXICILLIN-POT CLAVULANATE 875-125 MG PO TABS: 1.0000 | ORAL_TABLET | Freq: Two times a day (BID) | ORAL | Status: DC

## 2016-02-20 NOTE — Telephone Encounter (Signed)
Phone call Discussed with patient's daughter about WBC count elevated urine still showing signs of infection in spite of urinalysis done here being negative because of elevated white count patient's showing her usual signs of urinary tract infection Will treat with Augmentin

## 2016-02-22 ENCOUNTER — Other Ambulatory Visit: Payer: Medicare Other

## 2016-02-22 DIAGNOSIS — R197 Diarrhea, unspecified: Secondary | ICD-10-CM

## 2016-02-25 LAB — SPECIMEN STATUS REPORT

## 2016-02-29 LAB — STOOL CULTURE: E coli, Shiga toxin Assay: NEGATIVE

## 2016-02-29 LAB — OVA AND PARASITE EXAMINATION

## 2016-03-03 ENCOUNTER — Ambulatory Visit (INDEPENDENT_AMBULATORY_CARE_PROVIDER_SITE_OTHER): Payer: Medicare Other | Admitting: Family Medicine

## 2016-03-03 ENCOUNTER — Encounter: Payer: Self-pay | Admitting: Family Medicine

## 2016-03-03 VITALS — BP 122/81 | HR 66 | Temp 97.6°F

## 2016-03-03 DIAGNOSIS — N952 Postmenopausal atrophic vaginitis: Secondary | ICD-10-CM | POA: Insufficient documentation

## 2016-03-03 MED ORDER — ESTRADIOL 0.1 MG/GM VA CREA: TOPICAL_CREAM | VAGINAL | Status: DC

## 2016-03-03 NOTE — Progress Notes (Signed)
   BP 122/81 mmHg  Pulse 66  Temp(Src) 97.6 F (36.4 C)  Ht   Wt   SpO2 95%   Subjective:    Patient ID: [name], female    DOB: [birth year], 82 y.o.   MRN: [id number]  HPI: Tina Mainlandttalene W Och is a 82 y.o. female  Chief Complaint  Patient presents with  . Hematuria  Patient accompanied by her daughter who provides 100% of the history. Noted this morning was a little spot of blood on her pull ups. This was followed later with a using the bathroom also with a bowel movement showing little bit of blood in the toilet. Patient otherwise with no change Patient's mental status remains unchanged and is not oriented at all.  Relevant past medical, surgical, family and social history reviewed and updated as indicated. Interim medical history since our last visit reviewed. Allergies and medications reviewed and updated.  Review of Systems Unable to obtain review of systems  Per HPI unless specifically indicated above     Objective:    BP 122/81 mmHg  Pulse 66  Temp(Src) 97.6 F (36.4 C)  Ht   Wt   SpO2 95%  Wt Readings from Last 3 Encounters:  12/24/15 101 lb 1.6 oz (45.859 kg)  12/12/15 102 lb (46.267 kg)  11/26/15 101 lb (45.813 kg)    Physical Exam  Constitutional: She is oriented to person, place, and time. She appears well-developed and well-nourished. No distress.  HENT:  Head: Normocephalic and atraumatic.  Right Ear: Hearing normal.  Left Ear: Hearing normal.  Nose: Nose normal.  Eyes: Conjunctivae and lids are normal. Right eye exhibits no discharge. Left eye exhibits no discharge. No scleral icterus.  Pulmonary/Chest: Effort normal. No respiratory distress.  Genitourinary:  Atrophic vagina and perirectal area with several small petechial areas which looks like you have recently bled.  Musculoskeletal: Normal range of motion.  Neurological: She is alert and oriented to person, place, and time.  Skin: Skin is intact. No rash noted.  Psychiatric: She has a  normal mood and affect. Her speech is normal. Cognition and memory are normal.    Results for orders placed or performed in visit on 02/22/16  Stool culture  Result Value Ref Range   Salmonella/Shigella Screen Final report    Stool Culture result 1 ([facility]) Comment    Campylobacter Culture Final report    Stool Culture result 1 (CMPCXR) Comment    E coli, Shiga toxin Assay Negative Negative  Ova and parasite examination  Result Value Ref Range   OVA + PARASITE EXAM Final report    O&P result 1 Comment   Specimen status report  Result Value Ref Range   specimen status report Comment       Assessment & Plan:   Problem List Items Addressed This Visit      Genitourinary   Perimenopausal atrophic vaginitis - Primary    Discussed most likely cause of bleeding will observe for other bleeding episodes. Discussed with daughter if other type of bleeding suspected and no response to treatment with Estrace limited workup is all that the family is interested in with limited treatment.  We'll try Estrace for now          Follow up plan: Return for As scheduled.

## 2016-03-03 NOTE — Assessment & Plan Note (Signed)
Discussed most likely cause of bleeding will observe for other bleeding episodes. Discussed with daughter if other type of bleeding suspected and no response to treatment with Estrace limited workup is all that the family is interested in with limited treatment.  We'll try Estrace for now

## 2016-03-29 DIAGNOSIS — S42309A Unspecified fracture of shaft of humerus, unspecified arm, initial encounter for closed fracture: Secondary | ICD-10-CM

## 2016-03-29 HISTORY — DX: Unspecified fracture of shaft of humerus, unspecified arm, initial encounter for closed fracture: S42.309A

## 2016-04-23 ENCOUNTER — Other Ambulatory Visit: Payer: Self-pay | Admitting: Family Medicine

## 2016-04-28 ENCOUNTER — Other Ambulatory Visit: Payer: Medicare Other

## 2016-04-28 ENCOUNTER — Other Ambulatory Visit: Payer: Self-pay

## 2016-04-28 DIAGNOSIS — R8290 Unspecified abnormal findings in urine: Secondary | ICD-10-CM

## 2016-04-28 LAB — MICROSCOPIC EXAMINATION: WBC, UA: >30 /hpf — AB (ref 0–?)

## 2016-04-28 LAB — URINALYSIS, ROUTINE W REFLEX MICROSCOPIC
Bilirubin, UA: NEGATIVE
Glucose, UA: NEGATIVE
Ketones, UA: NEGATIVE
Nitrite, UA: POSITIVE — AB
Specific Gravity, UA: 1.02 (ref 1.005–1.030)
Urobilinogen, Ur: 0.2 mg/dL (ref 0.2–1.0)
pH, UA: 6 (ref 5.0–7.5)

## 2016-04-30 ENCOUNTER — Telehealth: Payer: Self-pay

## 2016-04-30 NOTE — Telephone Encounter (Signed)
Call daughter

## 2016-04-30 NOTE — Telephone Encounter (Signed)
Unidentified person called in requesting that we call patient's daughter with results of urinalysis brought in Monday

## 2016-05-01 LAB — URINE CULTURE

## 2016-05-01 MED ORDER — CIPROFLOXACIN HCL 250 MG PO TABS: 250.0000 mg | ORAL_TABLET | Freq: Two times a day (BID) | ORAL | 0 refills | Status: DC

## 2016-05-01 NOTE — Telephone Encounter (Signed)
Phone call Discussed with patient's daughter UTI will start treatment.

## 2016-05-01 NOTE — Addendum Note (Signed)
Addended byVonita Moss on: 05/01/2016 08:33 AM   Modules accepted: Orders

## 2016-05-08 ENCOUNTER — Telehealth: Payer: Self-pay

## 2016-05-08 MED ORDER — NITROFURANTOIN MONOHYD MACRO 100 MG PO CAPS: 100.0000 mg | ORAL_CAPSULE | Freq: Two times a day (BID) | ORAL | 0 refills | Status: DC

## 2016-05-08 NOTE — Telephone Encounter (Signed)
Daughter reports that patient took last dose of Cipro last night,  She is still showing intermittent confusion so they are wondering if she needs another round of ABX?  Or if she needs to bring in another urine sample.

## 2016-05-08 NOTE — Telephone Encounter (Signed)
rx sent

## 2016-05-08 NOTE — Telephone Encounter (Signed)
Will give patient another round of antibiotics

## 2016-05-19 ENCOUNTER — Other Ambulatory Visit: Payer: Medicare Other

## 2016-05-19 ENCOUNTER — Other Ambulatory Visit: Payer: Self-pay

## 2016-05-19 DIAGNOSIS — R8290 Unspecified abnormal findings in urine: Secondary | ICD-10-CM

## 2016-05-19 LAB — URINALYSIS, ROUTINE W REFLEX MICROSCOPIC
Bilirubin, UA: NEGATIVE
Glucose, UA: NEGATIVE
Ketones, UA: NEGATIVE
Leukocytes, UA: NEGATIVE
Nitrite, UA: NEGATIVE
Protein, UA: NEGATIVE
RBC, UA: NEGATIVE
Specific Gravity, UA: 1.015 (ref 1.005–1.030)
Urobilinogen, Ur: 0.2 mg/dL (ref 0.2–1.0)
pH, UA: 6 (ref 5.0–7.5)

## 2016-05-21 ENCOUNTER — Telehealth: Payer: Self-pay | Admitting: Family Medicine

## 2016-05-21 NOTE — Telephone Encounter (Signed)
pts daughter called and would like to get a call about lab results.

## 2016-05-22 ENCOUNTER — Other Ambulatory Visit: Payer: Self-pay

## 2016-05-22 ENCOUNTER — Other Ambulatory Visit: Payer: Medicare Other

## 2016-05-22 ENCOUNTER — Encounter: Payer: Self-pay | Admitting: Family Medicine

## 2016-05-22 ENCOUNTER — Ambulatory Visit (INDEPENDENT_AMBULATORY_CARE_PROVIDER_SITE_OTHER): Payer: Medicare Other | Admitting: Family Medicine

## 2016-05-22 ENCOUNTER — Other Ambulatory Visit: Payer: Self-pay | Admitting: Family Medicine

## 2016-05-22 VITALS — BP 142/65 | HR 67 | Temp 98.3°F

## 2016-05-22 DIAGNOSIS — N390 Urinary tract infection, site not specified: Secondary | ICD-10-CM

## 2016-05-22 DIAGNOSIS — R8281 Pyuria: Secondary | ICD-10-CM

## 2016-05-22 DIAGNOSIS — R8290 Unspecified abnormal findings in urine: Secondary | ICD-10-CM

## 2016-05-22 LAB — MICROSCOPIC EXAMINATION

## 2016-05-22 LAB — URINALYSIS, ROUTINE W REFLEX MICROSCOPIC
Bilirubin, UA: NEGATIVE
Glucose, UA: NEGATIVE
Nitrite, UA: NEGATIVE
Protein, UA: NEGATIVE
RBC, UA: NEGATIVE
Specific Gravity, UA: 1.015 (ref 1.005–1.030)
Urobilinogen, Ur: 0.2 mg/dL (ref 0.2–1.0)
pH, UA: 5.5 (ref 5.0–7.5)

## 2016-05-22 MED ORDER — CIPROFLOXACIN HCL 250 MG PO TABS: 250.0000 mg | ORAL_TABLET | Freq: Two times a day (BID) | ORAL | 1 refills | Status: DC

## 2016-05-22 NOTE — Telephone Encounter (Signed)
LVM for patient

## 2016-05-22 NOTE — Progress Notes (Signed)
BP (!) 142/65   Pulse 67   Temp 98.3 F (36.8 C)    Subjective:    Patient ID: [name], female    DOB: 04/13/1923, 82 y.o.   MRN: [id number]  HPI: [name] is a 82 y.o. female  Chief Complaint  Patient presents with  . Urinary Tract Infection    more confusion and anxious. Not peeing during the day. Some odor.    Patient is here with her daughter who provides the entire history. Patient presents with confusion, nervousness, and decreased urination with some noticed odor the past several days. The agitation and nervousness has been worsening over the course. Caregivers have been taking her to the restroom every 3 hours around the clock, wiping her properly, and pushing fluids and cranberry juice to try and prevent UTI. Denies fever, chills, back pain, or hematuria that daughter is aware of. Left urine sample this morning.  Relevant past medical, surgical, family and social history reviewed and updated as indicated. Interim medical history since our last visit reviewed. Allergies and medications reviewed and updated.  Review of Systems  Constitutional: Negative.   HENT: Negative.   Respiratory: Negative.   Cardiovascular: Negative.   Gastrointestinal: Negative.   Genitourinary: Positive for decreased urine volume.  Musculoskeletal: Negative.   Neurological: Positive for tremors (chronic, parkinson's).  Psychiatric/Behavioral: Positive for agitation. The patient is nervous/anxious.     Per HPI unless specifically indicated above     Objective:    BP (!) 142/65   Pulse 67   Temp 98.3 F (36.8 C)   Wt Readings from Last 3 Encounters:  12/24/15 101 lb 1.6 oz (45.9 kg)  12/12/15 102 lb (46.3 kg)  11/26/15 101 lb (45.8 kg)    Physical Exam  Constitutional: She appears well-developed. No distress.  HENT:  Head: Atraumatic.  Eyes: Conjunctivae are normal. No scleral icterus.  Neck: Normal range of motion. Neck supple.  Cardiovascular: Normal rate and  normal heart sounds.   Pulmonary/Chest: Effort normal. No respiratory distress.  Abdominal: Soft. Bowel sounds are normal.  Musculoskeletal:  No CVA tenderness  Neurological: She is alert.  Skin: Skin is warm and dry. No rash noted.  Psychiatric: She has a normal mood and affect. Her behavior is normal.  Nursing note and vitals reviewed.   Results for orders placed or performed in visit on 05/22/16  Microscopic Examination  Result Value Ref Range   WBC, UA 6-10 (A) 0 - 5 /hpf   RBC, UA 0-2 0 - 2 /hpf   Epithelial Cells (non renal) 0-10 0 - 10 /hpf   Crystals Present (A) N/A   Crystal Type Calcium Oxalate N/A   Mucus, UA Present Not Estab.   Bacteria, UA Few None seen/Few  Urinalysis, Routine w reflex microscopic (not at [facility])  Result Value Ref Range   Specific Gravity, UA 1.015 1.005 - 1.030   pH, UA 5.5 5.0 - 7.5   Color, UA Yellow Yellow   Appearance Ur Clear Clear   Leukocytes, UA Trace (A) Negative   Protein, UA Negative Negative/Trace   Glucose, UA Negative Negative   Ketones, UA Trace (A) Negative   RBC, UA Negative Negative   Bilirubin, UA Negative Negative   Urobilinogen, Ur 0.2 0.2 - 1.0 mg/dL   Nitrite, UA Negative Negative   Microscopic Examination See below:       Assessment & Plan:   Problem List Items Addressed This Visit    None    Visit  Diagnoses    UTI (lower urinary tract infection)    -  Primary   Positive U/A. Cipro sent, continue to push fluids and maintain bladder habit hygiene. If no mental status improvement following abx, follow up for further eval       Follow up plan: Return if symptoms worsen or fail to improve.

## 2016-05-22 NOTE — Patient Instructions (Signed)
Follow up as needed

## 2016-05-22 NOTE — Progress Notes (Signed)
Daughter brings urine by 4 review again patient continuing to deteriorate previous urinalysis was normal with no pyuria at all now patient with pyuria and diminishing mental status and agitation which is consistent with patent stents previous urinary tract infection status. We will start Cipro send urine for culture and sensitivity

## 2016-05-24 LAB — URINE CULTURE

## 2016-05-26 ENCOUNTER — Telehealth: Payer: Self-pay | Admitting: Family Medicine

## 2016-05-26 NOTE — Telephone Encounter (Signed)
Please let them know that she does not have a UTI. Thanks!

## 2016-06-04 ENCOUNTER — Ambulatory Visit (INDEPENDENT_AMBULATORY_CARE_PROVIDER_SITE_OTHER): Payer: Medicare Other

## 2016-06-04 DIAGNOSIS — Z23 Encounter for immunization: Secondary | ICD-10-CM

## 2016-06-14 ENCOUNTER — Other Ambulatory Visit: Payer: Self-pay | Admitting: Family Medicine

## 2016-06-23 ENCOUNTER — Emergency Department
Admission: EM | Admit: 2016-06-23 | Discharge: 2016-06-23 | Disposition: A | Discharge status: Home / Self Care | Payer: Medicare Other | Attending: Emergency Medicine | Admitting: Emergency Medicine

## 2016-06-23 ENCOUNTER — Encounter: Payer: Self-pay | Admitting: Emergency Medicine

## 2016-06-23 ENCOUNTER — Emergency Department: Payer: Medicare Other

## 2016-06-23 DIAGNOSIS — R55 Syncope and collapse: Secondary | ICD-10-CM | POA: Insufficient documentation

## 2016-06-23 DIAGNOSIS — I10 Essential (primary) hypertension: Secondary | ICD-10-CM | POA: Insufficient documentation

## 2016-06-23 DIAGNOSIS — R001 Bradycardia, unspecified: Secondary | ICD-10-CM | POA: Diagnosis not present

## 2016-06-23 DIAGNOSIS — F0280 Dementia in other diseases classified elsewhere without behavioral disturbance: Secondary | ICD-10-CM | POA: Insufficient documentation

## 2016-06-23 DIAGNOSIS — R5383 Other fatigue: Secondary | ICD-10-CM | POA: Diagnosis present

## 2016-06-23 DIAGNOSIS — G20 Parkinson's disease: Secondary | ICD-10-CM | POA: Insufficient documentation

## 2016-06-23 DIAGNOSIS — R0602 Shortness of breath: Secondary | ICD-10-CM

## 2016-06-23 HISTORY — DX: Unspecified dementia without behavioral disturbance: F03.90

## 2016-06-23 HISTORY — DX: Unspecified dementia, mild, without behavioral disturbance, psychotic disturbance, mood disturbance, and anxiety: F03.A0

## 2016-06-23 HISTORY — DX: Parkinson's disease without dyskinesia, without mention of fluctuations: G20.A1

## 2016-06-23 HISTORY — DX: Parkinson's disease: G20

## 2016-06-23 LAB — URINALYSIS COMPLETE WITH MICROSCOPIC (ARMC ONLY)
Bacteria, UA: NONE SEEN
Bilirubin Urine: NEGATIVE
Glucose, UA: NEGATIVE mg/dL
Hgb urine dipstick: NEGATIVE
Leukocytes, UA: NEGATIVE
Nitrite: NEGATIVE
Protein, ur: 30 mg/dL — AB
Specific Gravity, Urine: 1.024 (ref 1.005–1.030)
pH: 5 (ref 5.0–8.0)

## 2016-06-23 LAB — CBC
HCT: 38.8 % (ref 35.0–47.0)
Hemoglobin: 13.3 g/dL (ref 12.0–16.0)
MCH: 30.7 pg (ref 26.0–34.0)
MCHC: 34.3 g/dL (ref 32.0–36.0)
MCV: 89.5 fL (ref 80.0–100.0)
Platelets: 225 10*3/uL (ref 150–440)
RBC: 4.34 MIL/uL (ref 3.80–5.20)
RDW: 14.4 % (ref 11.5–14.5)
WBC: 7.4 10*3/uL (ref 3.6–11.0)

## 2016-06-23 LAB — BASIC METABOLIC PANEL
Anion gap: 5 (ref 5–15)
BUN: 26 mg/dL — ABNORMAL HIGH (ref 6–20)
CO2: 28 mmol/L (ref 22–32)
Calcium: 9.6 mg/dL (ref 8.9–10.3)
Chloride: 104 mmol/L (ref 101–111)
Creatinine, Ser: 0.73 mg/dL (ref 0.44–1.00)
GFR calc Af Amer: >60 mL/min (ref >60)
GFR calc non Af Amer: >60 mL/min (ref >60)
Glucose, Bld: 107 mg/dL — ABNORMAL HIGH (ref 65–99)
Potassium: 4.5 mmol/L (ref 3.5–5.1)
Sodium: 137 mmol/L (ref 135–145)

## 2016-06-23 LAB — TROPONIN I: Troponin I: <0.03 ng/mL (ref <0.03)

## 2016-06-23 MED ORDER — SODIUM CHLORIDE 0.9 % IV BOLUS (SEPSIS)
500.0000 mL | Freq: Once | INTRAVENOUS | Status: AC
  Administered 2016-06-23: 500 mL via INTRAVENOUS

## 2016-06-23 NOTE — ED Triage Notes (Signed)
Per pts caregivers pt has been more drowsy than normal and was difficult to arouse after nap today. Pt unable to answer questions other than name.

## 2016-06-23 NOTE — ED Provider Notes (Signed)
[facility] Emergency Department Provider Note   ____________________________________________   First MD Initiated Contact with Patient 06/23/16 1303     (approximate)  I have reviewed the triage vital signs and the nursing notes.   HISTORY  Chief Complaint Fatigue   HPI Tina Mainlandttalene W [name] is a 82 y.o. female with a history of Parkinson's disease as well as mild dementia who is presenting to emergency department today after an episode of being difficult to arouse. Her daughters are also the bedside and say that her caregiver said that she was having difficulty breathing with labored respirations and a slow heart rate. They're also concerned because the medics on scene initially reporting a possible heart block. The patient is denying any complaints at this time. The daughter said that she is now back to her baseline mental status. They say also that she has been eating and drinking well lately in taking her medications as prescribed.   Past Medical History:  Diagnosis Date  . Anemia   . Arm fracture 03/2016   left proximal humerus  . Arthritis   . B12 deficiency   . Hypertension   . Mild dementia   . Parkinson disease (HCC)   . Tremor     Patient Active Problem List   Diagnosis Date Noted  . Perimenopausal atrophic vaginitis 03/03/2016  . Pneumonia 12/17/2015  . Altered mental status 12/16/2015  . Parkinson's disease (HCC) 12/16/2015  . Dysphagia 12/16/2015  . Abnormal CXR 12/16/2015  . Confusion 11/12/2015  . B12 deficiency 10/29/2015  . Traumatic closed displaced fracture of multiple ribs 10/29/2015  . Tremor 07/04/2015  . Hypertension     Past Surgical History:  Procedure Laterality Date  . ABDOMINAL HYSTERECTOMY    . JOINT REPLACEMENT      Prior to Admission medications   Medication Sig Start Date End Date Taking? Authorizing Provider  bismuth subsalicylate (PEPTO BISMOL) 262 MG/15ML suspension Take 30 mLs by mouth every 6 (six)  hours as needed.    Historical Provider, MD  Calcium Carbonate (CALCIUM-CARB 600 PO) Take 1,200 mg by mouth every morning.    Historical Provider, MD  Carbidopa-Levodopa ER (SINEMET CR) 25-100 MG tablet controlled release Take 1 tablet by mouth 2 (two) times daily. 12/21/15   Katha HammingSnehalatha Konidena, MD  ciprofloxacin (CIPRO) 250 MG tablet Take 1 tablet (250 mg total) by mouth 2 (two) times daily. Patient not taking: Reported on 05/22/2016 05/22/16   Steele SizerMark A Crissman, MD  cyanocobalamin (,VITAMIN B-12,) 1000 MCG/ML injection 1000mcg injected into the muscle 1x per week for 4 weeks, then monthly for 6 months. 10/30/15   Megan P Johnson, DO  metoprolol succinate (TOPROL-XL) 50 MG 24 hr tablet TAKE 1 TABLET BY MOUTH ONCE EVERY DAY 04/23/16   Particia Nearingachel Elizabeth Lane, PA-C  metoprolol succinate (TOPROL-XL) 50 MG 24 hr tablet TAKE 1 TABLET BY MOUTH ONCE EVERY DAY 06/16/16   Steele SizerMark A Crissman, MD  Multiple Vitamins-Minerals (MULTIVITAMIN ADULT PO) Take 1 tablet by mouth daily.     Historical Provider, MD    Allergies Phenergan [promethazine hcl] and Sulfa antibiotics  Family History  Problem Relation Age of Onset  . Family history unknown: Yes    Social History Social History  Substance Use Topics  . Smoking status: Never Smoker  . Smokeless tobacco: Never Used  . Alcohol use No    Review of Systems Constitutional: No fever/chills Eyes: No visual changes. ENT: No sore throat. Cardiovascular: Denies chest pain. Respiratory: As above Gastrointestinal: No  abdominal pain.  No nausea, no vomiting.  No diarrhea.  No constipation. Genitourinary: Negative for dysuria. Musculoskeletal: Negative for back pain. Skin: Negative for rash. Neurological: Negative for headaches, focal weakness or numbness.  10-point ROS otherwise negative.  ____________________________________________   PHYSICAL EXAM:  VITAL SIGNS: ED Triage Vitals  Enc Vitals Group     BP 06/23/16 1430 97/76     Pulse Rate 06/23/16 1256  (!) 53     Resp 06/23/16 1256 15     Temp 06/23/16 1256 98.4 F (36.9 C)     Temp Source 06/23/16 1256 Oral     SpO2 06/23/16 1256 100 %     Weight 06/23/16 1254 110 lb (49.9 kg)     Height 06/23/16 1254 5' (1.524 m)     Head Circumference --      Peak Flow --      Pain Score --      Pain Loc --      Pain Edu? --      Excl. in GC? --     Constitutional: Alert and oriented. Appears frail Eyes: Conjunctivae are normal. PERRL. EOMI. Head: Atraumatic. Nose: No congestion/rhinnorhea. Mouth/Throat: Mucous membranes are moist.   Neck: No stridor.   Cardiovascular: Normal rate, regular rhythm. Grossly normal heart sounds.  Good peripheral circulation. Respiratory: Normal respiratory effort.  No retractions. Lungs CTAB. Gastrointestinal: Soft and nontender. No distention.  Musculoskeletal: No lower extremity tenderness nor edema.  No joint effusions. Neurologic:  Normal speech and language. No gross focal neurologic deficits are appreciated. Resting tremor consistent with parkinsonian tremor. Daughters also say that the tremor is a baseline feature of this patient. Skin:  Skin is warm, dry and intact. No rash noted. Psychiatric: Mood and affect are normal. Speech and behavior are normal.  ____________________________________________   LABS (all labs ordered are listed, but only abnormal results are displayed)  Labs Reviewed  BASIC METABOLIC PANEL - Abnormal; Notable for the following:       Result Value   Glucose, Bld 107 (*)    BUN 26 (*)    All other components within normal limits  URINALYSIS COMPLETEWITH MICROSCOPIC ([facility] ONLY) - Abnormal; Notable for the following:    Color, Urine AMBER (*)    APPearance CLEAR (*)    Ketones, ur TRACE (*)    Protein, ur 30 (*)    Squamous Epithelial / LPF 0-5 (*)    All other components within normal limits  CBC  TROPONIN I   ____________________________________________  EKG  ED ECG REPORT I, Arelia Longest, the attending  physician, personally viewed and interpreted this ECG.   Date: 06/23/2016  EKG Time: 1259  Rate: 55  Rhythm: sinus bradycardia although the baseline is poor.  Axis: Normal axis  Intervals:left bundle branch block  ST&T Change: No ST segment elevation or depression. T-wave inversion in aVL as well as 1.  No significant change in this EKG from previous. Left bundle branch block is also seen on previous EKGs  ____________________________________________  RADIOLOGY  DG Chest 1 View (Accession [id number]) (Order [id number])  Imaging  Date: 06/23/2016 Department: [facility] EMERGENCY DEPARTMENT Released By: Orinda Kenner, RT Authorizing: Myrna Blazer, MD  PACS Images   Show images for DG Chest 1 View  Study Result   CLINICAL DATA:  Shortness of Breath  EXAM: CHEST 1 VIEW  COMPARISON:  12/24/2015  FINDINGS: Cardiomegaly again noted. No acute infiltrate or pleural effusion. No pulmonary edema. There is  displaced fracture of the left humeral neck of indeterminate age. Clinical correlation is necessary.  IMPRESSION: Cardiomegaly. No active disease. Displaced fracture of left humeral neck of indeterminate age.   Electronically Signed   By: Natasha Mead M.D.   On: 06/23/2016 15:21     ____________________________________________   PROCEDURES  Procedure(s) performed:   Procedures  Critical Care performed:   ____________________________________________   INITIAL IMPRESSION / ASSESSMENT AND PLAN / ED COURSE  Pertinent labs & imaging results that were available during my care of the patient were reviewed by me and considered in my medical decision making (see chart for details).  ----------------------------------------- 3:49 PM on 06/23/2016 -----------------------------------------  Patient is awake and alert at this time. Heart rate is in the mid 60s at 65-67. Blood pressure is about 150/102.  Unclear cause of the  patient's slight episode of unresponsiveness. Morbid daughters has called back to the care provider at this time and the story has been further clarified. The patient had a large breakfast and then had a bowel movement. She then went to rest in her chair by the window. When one of her caregivers attempted to rouse her the patient began having labored respirations and said "I feel like I'm falling." The patient was also difficult to arouse during this time. However, at this time the patient appeared to return back to her normal mental status and is not bradycardic or hypotensive. Possible vagal episode versus autonomic instability from Parkinson's disease. I discussed making sure that the patient stays well-hydrated with the family. I also discussed taking twice a day vital signs and following up with her primary care doctor for any further medication adjustments. Very reassuring workup including lab work as well as x-ray and EKG. Fracture is remote in the family confirms this.   Clinical Course     ____________________________________________   FINAL CLINICAL IMPRESSION(S) / ED DIAGNOSES  Final diagnoses:  SOB (shortness of breath)   Near syncope. Bradycardia.   NEW MEDICATIONS STARTED DURING THIS VISIT:  New Prescriptions   No medications on file     Note:  This document was prepared using Dragon voice recognition software and may include unintentional dictation errors.    Myrna Blazer, MD 06/23/16 [phone]

## 2016-06-23 NOTE — ED Notes (Signed)
Per caregiver who is here pt sent for low HR and was "breathing funny". Pt back to normal now per caregiver.

## 2016-06-30 ENCOUNTER — Encounter: Payer: Self-pay | Admitting: Family Medicine

## 2016-06-30 ENCOUNTER — Ambulatory Visit (INDEPENDENT_AMBULATORY_CARE_PROVIDER_SITE_OTHER): Payer: Medicare Other | Admitting: Family Medicine

## 2016-06-30 DIAGNOSIS — G20 Parkinson's disease: Secondary | ICD-10-CM

## 2016-06-30 DIAGNOSIS — R4182 Altered mental status, unspecified: Secondary | ICD-10-CM

## 2016-06-30 DIAGNOSIS — I10 Essential (primary) hypertension: Secondary | ICD-10-CM

## 2016-06-30 DIAGNOSIS — R8290 Unspecified abnormal findings in urine: Secondary | ICD-10-CM | POA: Diagnosis not present

## 2016-06-30 NOTE — Assessment & Plan Note (Signed)
The current medical regimen is effective;  continue present plan and medications.  

## 2016-06-30 NOTE — Assessment & Plan Note (Signed)
Usually worsens with urinary tract infection the patient with no urinary tract infection today. We will observe symptoms if persists will check urinalysis again. Also discussed with patient's daughter using lorazepam to assist with sleep and rest also mentioned use of drugs such as Risperdal or Abilify. Discussed black box warning with such medications

## 2016-06-30 NOTE — Progress Notes (Signed)
BP 136/76 (BP Location: Right Arm, Patient Position: Sitting, Cuff Size: Small)   Pulse 62   Temp 97.5 F (36.4 C)    Subjective:    Patient ID: [name], female    DOB: January 08, 1923, 82 y.o.   MRN: [id number]  HPI: [name] is a 82 y.o. female  Chief Complaint  Patient presents with  . Urinary Tract Infection  . Insomnia  . Loss of Consciousness    last week, EMS took to ED   Discussed patient with daughter is patient largely unable to speak. Reviewed ER notes and labs which were essentially normal Concern patient not sleeping which is been usual pattern with UTI urinalysis today was completely normal. Patient otherwise stable on questioning about pain there is no comment Relevant past medical, surgical, family and social history reviewed and updated as indicated. Interim medical history since our last visit reviewed. Allergies and medications reviewed and updated.  Review of Systems  Unable to perform ROS: Age    Per HPI unless specifically indicated above     Objective:    BP 136/76 (BP Location: Right Arm, Patient Position: Sitting, Cuff Size: Small)   Pulse 62   Temp 97.5 F (36.4 C)   Wt Readings from Last 3 Encounters:  06/23/16 110 lb (49.9 kg)  12/24/15 101 lb 1.6 oz (45.9 kg)  12/12/15 102 lb (46.3 kg)    Physical Exam  Constitutional: No distress.  HENT:  Mouth/Throat: Oropharynx is clear and moist. No oropharyngeal exudate.  Neck: No tracheal deviation present. No thyromegaly present.  Cardiovascular: Normal rate and regular rhythm.   Pulmonary/Chest: Effort normal and breath sounds normal.  Abdominal: Soft.  Musculoskeletal: She exhibits no edema or tenderness.  Lymphadenopathy:    She has no cervical adenopathy.  Neurological: No cranial nerve deficit.  Skin: Skin is warm and dry. She is not diaphoretic.    Results for orders placed or performed during the hospital encounter of 06/23/16  Basic metabolic panel  Result Value Ref  Range   Sodium 137 135 - 145 mmol/L   Potassium 4.5 3.5 - 5.1 mmol/L   Chloride 104 101 - 111 mmol/L   CO2 28 22 - 32 mmol/L   Glucose, Bld 107 (H) 65 - 99 mg/dL   BUN 26 (H) 6 - 20 mg/dL   Creatinine, Ser 4.09 0.44 - 1.00 mg/dL   Calcium 9.6 8.9 - 81.1 mg/dL   GFR calc non Af Amer >60 >60 mL/min   GFR calc Af Amer >60 >60 mL/min   Anion gap 5 5 - 15  CBC  Result Value Ref Range   WBC 7.4 3.6 - 11.0 K/uL   RBC 4.34 3.80 - 5.20 MIL/uL   Hemoglobin 13.3 12.0 - 16.0 g/dL   HCT 91.4 78.2 - 95.6 %   MCV 89.5 80.0 - 100.0 fL   MCH 30.7 26.0 - 34.0 pg   MCHC 34.3 32.0 - 36.0 g/dL   RDW 21.3 08.6 - 57.8 %   Platelets 225 150 - 440 K/uL  Urinalysis complete, with microscopic  Result Value Ref Range   Color, Urine AMBER (A) YELLOW   APPearance CLEAR (A) CLEAR   Glucose, UA NEGATIVE NEGATIVE mg/dL   Bilirubin Urine NEGATIVE NEGATIVE   Ketones, ur TRACE (A) NEGATIVE mg/dL   Specific Gravity, Urine 1.024 1.005 - 1.030   Hgb urine dipstick NEGATIVE NEGATIVE   pH 5.0 5.0 - 8.0   Protein, ur 30 (A) NEGATIVE mg/dL  Nitrite NEGATIVE NEGATIVE   Leukocytes, UA NEGATIVE NEGATIVE   RBC / HPF 0-5 0 - 5 RBC/hpf   WBC, UA 0-5 0 - 5 WBC/hpf   Bacteria, UA NONE SEEN NONE SEEN   Squamous Epithelial / LPF 0-5 (A) NONE SEEN   Mucous PRESENT    Hyaline Casts, UA PRESENT   Troponin I  Result Value Ref Range   Troponin I <0.03 <0.03 ng/mL      Assessment & Plan:   Problem List Items Addressed This Visit      Cardiovascular and Mediastinum   Hypertension    The current medical regimen is effective;  continue present plan and medications.         Nervous and Auditory   Parkinson's disease [facility](HCC)    The current medical regimen is effective;  continue present plan and medications.         Other   Altered mental status    Usually worsens with urinary tract infection the patient with no urinary tract infection today. We will observe symptoms if persists will check urinalysis again. Also  discussed with patient's daughter using lorazepam to assist with sleep and rest also mentioned use of drugs such as Risperdal or Abilify. Discussed black box warning with such medications       Other Visit Diagnoses    Abnormal urine odor           Follow up plan: Return if symptoms worsen or fail to improve, for Physical Exam.

## 2016-07-01 LAB — URINALYSIS, ROUTINE W REFLEX MICROSCOPIC
Bilirubin, UA: NEGATIVE
Glucose, UA: NEGATIVE
Nitrite, UA: NEGATIVE
RBC, UA: NEGATIVE
Specific Gravity, UA: 1.025 (ref 1.005–1.030)
Urobilinogen, Ur: 0.2 mg/dL (ref 0.2–1.0)
pH, UA: 6 (ref 5.0–7.5)

## 2016-07-01 LAB — MICROSCOPIC EXAMINATION: RBC, UA: NONE SEEN /hpf (ref 0–?)

## 2016-07-16 ENCOUNTER — Encounter: Payer: Self-pay | Admitting: Family Medicine

## 2016-07-16 ENCOUNTER — Ambulatory Visit (INDEPENDENT_AMBULATORY_CARE_PROVIDER_SITE_OTHER): Payer: Medicare Other | Admitting: Family Medicine

## 2016-07-16 VITALS — BP 120/60 | Temp 98.1°F

## 2016-07-16 DIAGNOSIS — Z0000 Encounter for general adult medical examination without abnormal findings: Secondary | ICD-10-CM

## 2016-07-16 DIAGNOSIS — G20 Parkinson's disease: Secondary | ICD-10-CM | POA: Diagnosis not present

## 2016-07-16 DIAGNOSIS — E538 Deficiency of other specified B group vitamins: Secondary | ICD-10-CM | POA: Diagnosis not present

## 2016-07-16 DIAGNOSIS — R8290 Unspecified abnormal findings in urine: Secondary | ICD-10-CM | POA: Diagnosis not present

## 2016-07-16 DIAGNOSIS — I10 Essential (primary) hypertension: Secondary | ICD-10-CM

## 2016-07-16 MED ORDER — METOPROLOL SUCCINATE ER 50 MG PO TB24: 50.0000 mg | ORAL_TABLET | Freq: Every day | ORAL | 4 refills | Status: DC

## 2016-07-16 MED ORDER — CYANOCOBALAMIN 1000 MCG/ML IJ SOLN: INTRAMUSCULAR | 4 refills | Status: DC

## 2016-07-16 MED ORDER — CIPROFLOXACIN HCL 250 MG PO TABS: 250.0000 mg | ORAL_TABLET | Freq: Two times a day (BID) | ORAL | 0 refills | Status: DC

## 2016-07-16 NOTE — Assessment & Plan Note (Signed)
Discussed in Will restart B12 injections monthly

## 2016-07-16 NOTE — Assessment & Plan Note (Signed)
The current medical regimen is effective;  continue present plan and medications.  

## 2016-07-16 NOTE — Progress Notes (Signed)
BP 120/60   Temp 98.1 F (36.7 C)    Subjective:    Patient ID: [name], female    DOB: [birth year], 82 y.o.   MRN: [id number]  HPI: [name] is a 82 y.o. female  Chief Complaint  Patient presents with  . Annual Exam   Patient for physical exam accompanied by her daughter and husband all in all is been doing okay Parkinson's stable managed by neurology. Has noticed darkening and cloudiness of urine though no standard urinary tract symptoms Vitamin B12 injections has not been doing since broken arm because of small muscle mass Relevant past medical, surgical, family and social history reviewed and updated as indicated. Interim medical history since our last visit reviewed. Allergies and medications reviewed and updated.  Review of Systems  Constitutional: Negative.   HENT: Negative.   Eyes: Negative.   Respiratory: Negative.   Cardiovascular: Negative.   Gastrointestinal: Negative.   Endocrine: Negative.   Genitourinary: Negative.   Musculoskeletal: Negative.   Skin: Negative.   Allergic/Immunologic: Negative.   Neurological: Negative.   Hematological: Negative.   Psychiatric/Behavioral: Negative.     Per HPI unless specifically indicated above     Objective:    BP 120/60   Temp 98.1 F (36.7 C)   Wt Readings from Last 3 Encounters:  06/23/16 110 lb (49.9 kg)  12/24/15 101 lb 1.6 oz (45.9 kg)  12/12/15 102 lb (46.3 kg)    Physical Exam  Constitutional: She is oriented to person, place, and time. She appears well-developed and well-nourished.  HENT:  Head: Normocephalic and atraumatic.  Right Ear: External ear normal.  Left Ear: External ear normal.  Nose: Nose normal.  Mouth/Throat: Oropharynx is clear and moist.  Eyes: Conjunctivae and EOM are normal. Pupils are equal, round, and reactive to light.  Neck: Normal range of motion. Neck supple. Carotid bruit is not present.  Cardiovascular: Normal rate, regular rhythm and normal heart sounds.     No murmur heard. Pulmonary/Chest: Effort normal and breath sounds normal. She exhibits no mass. Right breast exhibits no mass, no skin change and no tenderness. Left breast exhibits no mass, no skin change and no tenderness. Breasts are symmetrical.  Abdominal: Soft. Bowel sounds are normal. There is no hepatosplenomegaly.  Musculoskeletal: Normal range of motion. She exhibits no edema or tenderness.  Neurological: She is alert and oriented to person, place, and time. She displays abnormal reflex. She exhibits abnormal muscle tone. Coordination abnormal.  Marked Parkinson's tremor  Skin: Skin is warm and dry. No rash noted.  Psychiatric: Her behavior is normal.    Results for orders placed or performed in visit on 06/30/16  Microscopic Examination  Result Value Ref Range   WBC, UA 0-5 0 - 5 /hpf   RBC, UA None seen 0 - 2 /hpf   Epithelial Cells (non renal) 0-10 0 - 10 /hpf   Crystals Present (A) N/A   Crystal Type Amorphous Sediment N/A   Mucus, UA Present (A) Not Estab.   Bacteria, UA Few (A) None seen/Few   Yeast, UA Present (A) None seen  Urinalysis, Routine w reflex microscopic (not at [facility])  Result Value Ref Range   Specific Gravity, UA 1.025 1.005 - 1.030   pH, UA 6.0 5.0 - 7.5   Color, UA Yellow Yellow   Appearance Ur Clear Clear   Leukocytes, UA Trace (A) Negative   Protein, UA 1+ (A) Negative/Trace   Glucose, UA Negative Negative   Ketones, UA  Trace (A) Negative   RBC, UA Negative Negative   Bilirubin, UA Negative Negative   Urobilinogen, Ur 0.2 0.2 - 1.0 mg/dL   Nitrite, UA Negative Negative   Microscopic Examination See below:       Assessment & Plan:   Problem List Items Addressed This Visit      Cardiovascular and Mediastinum   Hypertension    The current medical regimen is effective;  continue present plan and medications.       Relevant Medications   metoprolol succinate (TOPROL-XL) 50 MG 24 hr tablet   Other Relevant Orders   Comprehensive  metabolic panel   Lipid panel   TSH     Nervous and Auditory   Parkinson's disease (HCC)    The current medical regimen is effective;  continue present plan and medications.       Relevant Orders   Comprehensive metabolic panel   Lipid panel   TSH     Other   B12 deficiency    Discussed in Will restart B12 injections monthly      Relevant Orders   Comprehensive metabolic panel   Lipid panel   CBC with Differential/Platelet   Cloudy urine - Primary   Relevant Orders   UA/M w/rflx Culture, Routine (STAT)   Urine culture    Other Visit Diagnoses    PE (physical exam), annual       Relevant Orders   Comprehensive metabolic panel   Lipid panel   CBC with Differential/Platelet   TSH       Follow up plan: Return in about 6 months (around 01/14/2017) for BMP.

## 2016-07-16 NOTE — Telephone Encounter (Signed)
Routing to provider  

## 2016-07-17 ENCOUNTER — Encounter: Payer: Self-pay | Admitting: Family Medicine

## 2016-07-17 LAB — CBC WITH DIFFERENTIAL/PLATELET
Basophils Absolute: 0 10*3/uL (ref 0.0–0.2)
Basos: 0 %
EOS (ABSOLUTE): 0.1 10*3/uL (ref 0.0–0.4)
Eos: 1 %
Hematocrit: 37.6 % (ref 34.0–46.6)
Hemoglobin: 12.3 g/dL (ref 11.1–15.9)
Immature Grans (Abs): 0 10*3/uL (ref 0.0–0.1)
Immature Granulocytes: 0 %
Lymphocytes Absolute: 2.5 10*3/uL (ref 0.7–3.1)
Lymphs: 35 %
MCH: 28.9 pg (ref 26.6–33.0)
MCHC: 32.7 g/dL (ref 31.5–35.7)
MCV: 89 fL (ref 79–97)
Monocytes Absolute: 0.9 10*3/uL (ref 0.1–0.9)
Monocytes: 12 %
Neutrophils Absolute: 3.8 10*3/uL (ref 1.4–7.0)
Neutrophils: 52 %
Platelets: 264 10*3/uL (ref 150–379)
RBC: 4.25 x10E6/uL (ref 3.77–5.28)
RDW: 14.4 % (ref 12.3–15.4)
WBC: 7.3 10*3/uL (ref 3.4–10.8)

## 2016-07-17 LAB — LIPID PANEL
Chol/HDL Ratio: 3 ratio units (ref 0.0–4.4)
Cholesterol, Total: 172 mg/dL (ref 100–199)
HDL: 57 mg/dL (ref >39)
LDL Calculated: 97 mg/dL (ref 0–99)
Triglycerides: 90 mg/dL (ref 0–149)
VLDL Cholesterol Cal: 18 mg/dL (ref 5–40)

## 2016-07-17 LAB — COMPREHENSIVE METABOLIC PANEL
ALT: 5 IU/L (ref 0–32)
AST: 18 IU/L (ref 0–40)
Albumin/Globulin Ratio: 2.3 — ABNORMAL HIGH (ref 1.2–2.2)
Albumin: 4.4 g/dL (ref 3.2–4.6)
Alkaline Phosphatase: 71 IU/L (ref 39–117)
BUN/Creatinine Ratio: 33 — ABNORMAL HIGH (ref 12–28)
BUN: 20 mg/dL (ref 10–36)
Bilirubin Total: 0.5 mg/dL (ref 0.0–1.2)
CO2: 25 mmol/L (ref 18–29)
Calcium: 9.6 mg/dL (ref 8.7–10.3)
Chloride: 101 mmol/L (ref 96–106)
Creatinine, Ser: 0.61 mg/dL (ref 0.57–1.00)
GFR calc Af Amer: 92 mL/min/{1.73_m2} (ref >59)
GFR calc non Af Amer: 80 mL/min/{1.73_m2} (ref >59)
Globulin, Total: 1.9 g/dL (ref 1.5–4.5)
Glucose: 96 mg/dL (ref 65–99)
Potassium: 4.9 mmol/L (ref 3.5–5.2)
Sodium: 140 mmol/L (ref 134–144)
Total Protein: 6.3 g/dL (ref 6.0–8.5)

## 2016-07-17 LAB — TSH: TSH: 2.45 u[IU]/mL (ref 0.450–4.500)

## 2016-07-18 LAB — UA/M W/RFLX CULTURE, ROUTINE
Bilirubin, UA: NEGATIVE
Glucose, UA: NEGATIVE
Ketones, UA: NEGATIVE
Nitrite, UA: NEGATIVE
RBC, UA: NEGATIVE
Specific Gravity, UA: 1.015 (ref 1.005–1.030)
Urobilinogen, Ur: 0.2 mg/dL (ref 0.2–1.0)
pH, UA: 7 (ref 5.0–7.5)

## 2016-07-18 LAB — MICROSCOPIC EXAMINATION

## 2016-07-18 LAB — URINE CULTURE, REFLEX

## 2016-07-21 ENCOUNTER — Telehealth: Payer: Self-pay | Admitting: Family Medicine

## 2016-07-21 NOTE — Telephone Encounter (Signed)
Phone call Discussed with patient's daughter urine specimen equivocal and resistant to Cipro will stop Cipro patient with no UTI symptoms will observe and if problems will reconsider antibiotic therapy.

## 2016-07-21 NOTE — Telephone Encounter (Signed)
-----   Message from Elby ShowersAmy J Workman, [place] sent at 07/21/2016 12:04 PM EDT ----- Daughter, Luster [place]

## 2016-07-24 ENCOUNTER — Telehealth: Payer: Self-pay | Admitting: Family Medicine

## 2016-07-24 MED ORDER — NITROFURANTOIN MONOHYD MACRO 100 MG PO CAPS: 100.0000 mg | ORAL_CAPSULE | Freq: Two times a day (BID) | ORAL | 0 refills | Status: DC

## 2016-07-24 NOTE — Telephone Encounter (Signed)
[facility] [place] [place]

## 2016-07-24 NOTE — Telephone Encounter (Signed)
Susceptible to nitrofurantoin. Rx sent to her phamacy

## 2016-07-24 NOTE — Telephone Encounter (Signed)
Pt's daughter called stated the pt is experiencing symptoms of a UTI. Stated Dr. Dossie Arbourrissman told them if this happened to call and he would send something to the pharmacy for her. Daughter stated Cipro did not help last time and pt is allergic to sulfa antibiotics. [facility] [place]. Please call pt's daughter is more information is needed @ [phone]. Thanks.

## 2016-07-24 NOTE — Telephone Encounter (Signed)
Routing to provider. Dr.Crissman patient. 

## 2016-08-27 ENCOUNTER — Other Ambulatory Visit: Payer: Self-pay | Admitting: Family Medicine

## 2016-08-27 ENCOUNTER — Other Ambulatory Visit: Payer: Medicare Other

## 2016-08-27 DIAGNOSIS — R399 Unspecified symptoms and signs involving the genitourinary system: Secondary | ICD-10-CM

## 2016-08-27 LAB — UA/M W/RFLX CULTURE, ROUTINE
Bilirubin, UA: NEGATIVE
Glucose, UA: NEGATIVE
Ketones, UA: NEGATIVE
Leukocytes, UA: NEGATIVE
Nitrite, UA: NEGATIVE
Protein, UA: NEGATIVE
RBC, UA: NEGATIVE
Specific Gravity, UA: 1.015 (ref 1.005–1.030)
Urobilinogen, Ur: 0.2 mg/dL (ref 0.2–1.0)
pH, UA: 6.5 (ref 5.0–7.5)

## 2016-08-27 LAB — MICROSCOPIC EXAMINATION
Bacteria, UA: NONE SEEN
Epithelial Cells (non renal): NONE SEEN /hpf (ref 0–10)
RBC, UA: NONE SEEN /hpf (ref 0–?)
WBC, UA: NONE SEEN /hpf (ref 0–?)

## 2016-08-28 ENCOUNTER — Telehealth: Payer: Self-pay | Admitting: Family Medicine

## 2016-08-28 NOTE — Telephone Encounter (Signed)
Left message to call.

## 2016-08-28 NOTE — Telephone Encounter (Signed)
Patient's daughter notified.

## 2016-08-28 NOTE — Telephone Encounter (Signed)
Please call patient's daughter and let her know that her urine came back normal. Thanks

## 2016-09-16 ENCOUNTER — Other Ambulatory Visit: Payer: Medicare Other

## 2016-09-16 ENCOUNTER — Telehealth: Payer: Self-pay | Admitting: Family Medicine

## 2016-09-16 ENCOUNTER — Other Ambulatory Visit: Payer: Self-pay | Admitting: Family Medicine

## 2016-09-16 DIAGNOSIS — R8290 Unspecified abnormal findings in urine: Secondary | ICD-10-CM

## 2016-09-16 LAB — URINALYSIS, ROUTINE W REFLEX MICROSCOPIC
Bilirubin, UA: NEGATIVE
Glucose, UA: NEGATIVE
Ketones, UA: NEGATIVE
Nitrite, UA: NEGATIVE
Protein, UA: NEGATIVE
Specific Gravity, UA: 1.015 (ref 1.005–1.030)
Urobilinogen, Ur: 0.2 mg/dL (ref 0.2–1.0)
pH, UA: 7.5 (ref 5.0–7.5)

## 2016-09-16 LAB — MICROSCOPIC EXAMINATION

## 2016-09-16 MED ORDER — NITROFURANTOIN MONOHYD MACRO 100 MG PO CAPS: 100.0000 mg | ORAL_CAPSULE | Freq: Two times a day (BID) | ORAL | 0 refills | Status: DC

## 2016-09-16 NOTE — Progress Notes (Signed)
Phone call with daughter patient with UTI symptoms Brought in urine specimen which is positive will treat with Macrobid

## 2016-09-16 NOTE — Telephone Encounter (Signed)
Routing to provider  

## 2016-09-16 NOTE — Telephone Encounter (Signed)
Call and let [name] know that Dr. Dossie Arbourrissman said they could just drop a urine sample off. [name] said they would bring one some time this afternoon.

## 2016-09-16 NOTE — Telephone Encounter (Signed)
Review urine specimen in place no need to see the patient

## 2016-10-13 ENCOUNTER — Emergency Department: Payer: Medicare Other

## 2016-10-13 ENCOUNTER — Emergency Department
Admission: EM | Admit: 2016-10-13 | Discharge: 2016-10-13 | Disposition: A | Discharge status: Home / Self Care | Payer: Medicare Other | Attending: Emergency Medicine | Admitting: Emergency Medicine

## 2016-10-13 DIAGNOSIS — G20 Parkinson's disease: Secondary | ICD-10-CM | POA: Diagnosis not present

## 2016-10-13 DIAGNOSIS — I10 Essential (primary) hypertension: Secondary | ICD-10-CM | POA: Diagnosis not present

## 2016-10-13 DIAGNOSIS — R4182 Altered mental status, unspecified: Secondary | ICD-10-CM

## 2016-10-13 DIAGNOSIS — R531 Weakness: Secondary | ICD-10-CM | POA: Insufficient documentation

## 2016-10-13 DIAGNOSIS — Z79899 Other long term (current) drug therapy: Secondary | ICD-10-CM | POA: Insufficient documentation

## 2016-10-13 LAB — URINALYSIS, COMPLETE (UACMP) WITH MICROSCOPIC
Bacteria, UA: NONE SEEN
Bilirubin Urine: NEGATIVE
Glucose, UA: NEGATIVE mg/dL
Hgb urine dipstick: NEGATIVE
Ketones, ur: 5 mg/dL — AB
Leukocytes, UA: NEGATIVE
Nitrite: NEGATIVE
Protein, ur: 30 mg/dL — AB
Specific Gravity, Urine: 1.021 (ref 1.005–1.030)
pH: 5 (ref 5.0–8.0)

## 2016-10-13 LAB — CBC
HCT: 35.4 % (ref 35.0–47.0)
Hemoglobin: 12 g/dL (ref 12.0–16.0)
MCH: 31.9 pg (ref 26.0–34.0)
MCHC: 34 g/dL (ref 32.0–36.0)
MCV: 93.9 fL (ref 80.0–100.0)
Platelets: 157 10*3/uL (ref 150–440)
RBC: 3.77 MIL/uL — ABNORMAL LOW (ref 3.80–5.20)
RDW: 13.2 % (ref 11.5–14.5)
WBC: 4.4 10*3/uL (ref 3.6–11.0)

## 2016-10-13 LAB — BASIC METABOLIC PANEL
Anion gap: 5 (ref 5–15)
BUN: 21 mg/dL — ABNORMAL HIGH (ref 6–20)
CO2: 27 mmol/L (ref 22–32)
Calcium: 9.4 mg/dL (ref 8.9–10.3)
Chloride: 106 mmol/L (ref 101–111)
Creatinine, Ser: 0.68 mg/dL (ref 0.44–1.00)
GFR calc Af Amer: >60 mL/min (ref >60)
GFR calc non Af Amer: >60 mL/min (ref >60)
Glucose, Bld: 121 mg/dL — ABNORMAL HIGH (ref 65–99)
Potassium: 3.6 mmol/L (ref 3.5–5.1)
Sodium: 138 mmol/L (ref 135–145)

## 2016-10-13 NOTE — ED Provider Notes (Signed)
[facility] Emergency Department Provider Note  Time seen: 4:03 PM  I have reviewed the triage vital signs and the nursing notes.   HISTORY  Chief Complaint Weakness and Altered Mental Status    HPI [name] is a 82 y.o. female with a past medical history of anemia, hypertension, dementia, Parkinson's, who presents the emergency department for a change in mental status. According to the family earlier today the home health nurse said the patient appeared to slump over in her chair. She called the daughter decided to transfer the patient to the emergency department for evaluation. Daughter states upon her seeing the patient she was back to baseline, acting normal. It is not entirely clear how long the episode lasted. Patient denies any symptoms currently. The daughter states the patient is acting normal. Patient is unable to give much of a history, but daughter states this is typical.  Past Medical History:  Diagnosis Date  . Anemia   . Arm fracture 03/2016   left proximal humerus  . Arthritis   . B12 deficiency   . Hypertension   . Mild dementia   . Parkinson disease (HCC)   . Tremor     Patient Active Problem List   Diagnosis Date Noted  . Cloudy urine 07/16/2016  . Perimenopausal atrophic vaginitis 03/03/2016  . Parkinson's disease (HCC) 12/16/2015  . Dysphagia 12/16/2015  . B12 deficiency 10/29/2015  . Traumatic closed displaced fracture of multiple ribs 10/29/2015  . Hypertension     Past Surgical History:  Procedure Laterality Date  . ABDOMINAL HYSTERECTOMY    . JOINT REPLACEMENT      Prior to Admission medications   Medication Sig Start Date End Date Taking? Authorizing Provider  acetaminophen (TYLENOL) 325 MG tablet Take 650 mg by mouth every 6 (six) hours as needed.   Yes Historical Provider, MD  Calcium Carbonate (CALCIUM-CARB 600 PO) Take 600 mg by mouth 2 (two) times daily.    Yes Historical Provider, MD  Carbidopa-Levodopa ER  (SINEMET CR) 25-100 MG tablet controlled release Take 1 tablet by mouth 2 (two) times daily. 12/21/15  Yes Katha Hamming, MD  Cholecalciferol (D3-1000 PO) Take 2 capsules by mouth daily.   Yes Historical Provider, MD  docusate sodium (COLACE) 100 MG capsule Take 100 mg by mouth 2 (two) times daily.   Yes Historical Provider, MD  metoprolol succinate (TOPROL-XL) 50 MG 24 hr tablet Take 1 tablet (50 mg total) by mouth daily. 07/16/16  Yes Steele Sizer, MD  Multiple Vitamins-Minerals (MULTIVITAMIN ADULT PO) Take 1 tablet by mouth daily.    Yes Historical Provider, MD  cyanocobalamin (,VITAMIN B-12,) 1000 MCG/ML injection 1cc IM q mo Patient not taking: Reported on 10/13/2016 07/16/16   Steele Sizer, MD  nitrofurantoin, macrocrystal-monohydrate, (MACROBID) 100 MG capsule Take 1 capsule (100 mg total) by mouth 2 (two) times daily. Patient not taking: Reported on 10/13/2016 09/16/16   Steele Sizer, MD    Allergies  Allergen Reactions  . Phenergan [Promethazine Hcl] Other (See Comments)    Family states the patient almost went into a coma.  . Sulfa Antibiotics Other (See Comments)    Unknown reaction    Family History  Problem Relation Age of Onset  . Family history unknown: Yes    Social History Social History  Substance Use Topics  . Smoking status: Never Smoker  . Smokeless tobacco: Never Used  . Alcohol use No    Review of Systems Constitutional: Negative for fever.  Cardiovascular: Negative for chest pain. Respiratory: Negative for shortness of breath. Gastrointestinal: Negative for abdominal pain Musculoskeletal: Negative for back pain. Neurological: Negative for headache 10-point ROS otherwise negative.  ____________________________________________   PHYSICAL EXAM:  VITAL SIGNS: ED Triage Vitals [10/13/16 1322]  Enc Vitals Group     BP (!) 150/58     Pulse Rate (!) 58     Resp 16     Temp 97.9 F (36.6 C)     Temp Source Rectal     SpO2 100 %      Weight 96 lb 6.4 oz (43.7 kg)     Height 5\' 4"  (1.626 m)     Head Circumference      Peak Flow      Pain Score      Pain Loc      Pain Edu?      Excl. in GC?    Constitutional: Alert. Well appearing and in no distress. Mild resting tremor, daughter states this is baseline. Eyes: Normal exam ENT   Head: Normocephalic and atraumatic.   Mouth/Throat: Mucous membranes are moist. Cardiovascular: Normal rate, regular rhythm Respiratory: Normal respiratory effort without tachypnea nor retractions. Breath sounds are clear  Gastrointestinal: Soft and nontender. No distention.  Musculoskeletal: Nontender with normal range of motion in all extremities.  Neurologic:  Normal speech and language. No gross focal neurologic deficits  Skin:  Skin is warm, dry and intact.  Psychiatric: Mood and affect are normal.  ____________________________________________    EKG  EKG reviewed and interpreted by myself shows atrial fibrillation at 56 bpm with a widened QRS, normal axis and nonspecific ST changes, most consistent with left bundle branch block.  ____________________________________________    RADIOLOGY  Chest x-ray negative  ____________________________________________   INITIAL IMPRESSION / ASSESSMENT AND PLAN / ED COURSE  Pertinent labs & imaging results that were available during my care of the patient were reviewed by me and considered in my medical decision making (see chart for details).  The patient presents to the emergency department after an episode of slumping over this morning. Daughter states the patient is acting normal currently. She appears well with no complaints at this time. Labs are largely within normal limits with a normal white blood cell count normal urinalysis and normal chemistry. We'll obtain a chest x-ray as well as a CT scan of the head to further evaluate. Patient and daughter are agreeable. Daughter denies any recent fever or congestion but states she  has coughed several times while awaiting ER evaluation.  Chest x-ray and CT are negative. Daughter states the patient is acting at baseline and wishes to take her home. At the patient's workup is largely nonrevealing we'll discharge the patient into the family's care.  ____________________________________________   FINAL CLINICAL IMPRESSION(S) / ED DIAGNOSES  Altered mental status    Minna AntisKevin Libia Fazzini, MD 10/13/16 1721

## 2016-10-13 NOTE — ED Notes (Signed)
Pt taken to car via wheelchair and lifted into car by staff.

## 2016-10-13 NOTE — ED Triage Notes (Addendum)
PT comes into the ED via EMS from home with c/o pt mental status, generalized weakness not acting herself per in home caregiver that assist pt husband with 24/7 care.. Pt denies any pain, does states she is feeling nauseous.. Daughter is at the bedside, states she is at her baseline.

## 2016-10-13 NOTE — Discharge Instructions (Signed)
Please drink plenty of fluids over the next several days. Please follow-up with her primary care doctor in 1-2 days for recheck/reevaluation. Return to the emergency department for any personally concerning symptoms.

## 2016-10-14 ENCOUNTER — Other Ambulatory Visit: Payer: Self-pay | Admitting: Family Medicine

## 2016-10-14 NOTE — Telephone Encounter (Signed)
Years supply sent in October. Will call pharmacy later to verify.

## 2016-10-15 ENCOUNTER — Telehealth: Payer: Medicare Other | Admitting: Nurse Practitioner

## 2016-10-15 DIAGNOSIS — R059 Cough, unspecified: Secondary | ICD-10-CM

## 2016-10-15 DIAGNOSIS — R05 Cough: Secondary | ICD-10-CM

## 2016-10-15 MED ORDER — AZITHROMYCIN 250 MG PO TABS: ORAL_TABLET | ORAL | 0 refills | Status: DC

## 2016-10-15 NOTE — Progress Notes (Signed)
We are sorry that you are not feeling well.  Here is how we plan to help!  Based on what you have shared with me it looks like you have upper respiratory tract inflammation that has resulted in a significant cough.  Inflammation and infection in the upper respiratory tract is commonly called bronchitis and has four common causes:  Allergies, Viral Infections, Acid Reflux and Bacterial Infections.  Allergies, viruses and acid reflux are treated by controlling symptoms or eliminating the cause. An example might be a cough caused by taking certain blood pressure medications. You stop the cough by changing the medication. Another example might be a cough caused by acid reflux. Controlling the reflux helps control the cough.  Based on your presentation I believe you most likely have A cough due to bacteria.  When patients have a fever and a productive cough with a change in color or increased sputum production, we are concerned about bacterial bronchitis.  If left untreated it can progress to pneumonia.  If your symptoms do not improve with your treatment plan it is important that you contact your provider.   I have prescribed Azithromyin 250 mg: two tables now and then one tablet daily for 4 additonal days    In addition you may use A non-prescription cough medication called Mucinex DM: take 2 tablets every 12 hours.    USE OF BRONCHODILATOR ("RESCUE") INHALERS: There is a risk from using your bronchodilator too frequently.  The risk is that over-reliance on a medication which only relaxes the muscles surrounding the breathing tubes can reduce the effectiveness of medications prescribed to reduce swelling and congestion of the tubes themselves.  Although you feel brief relief from the bronchodilator inhaler, your asthma may actually be worsening with the tubes becoming more swollen and filled with mucus.  This can delay other crucial treatments, such as oral steroid medications. If you need to use a  bronchodilator inhaler daily, several times per day, you should discuss this with your provider.  There are probably better treatments that could be used to keep your asthma under control.     HOME CARE . Only take medications as instructed by your medical team. . Complete the entire course of an antibiotic. . Drink plenty of fluids and get plenty of rest. . Avoid close contacts especially the very young and the elderly . Cover your mouth if you cough or cough into your sleeve. . Always remember to wash your hands . A steam or ultrasonic humidifier can help congestion.   GET HELP RIGHT AWAY IF: . You develop worsening fever. . You become short of breath . You cough up blood. . Your symptoms persist after you have completed your treatment plan MAKE SURE YOU   Understand these instructions.  Will watch your condition.  Will get help right away if you are not doing well or get worse.  Your e-visit answers were reviewed by a board certified advanced clinical practitioner to complete your personal care plan.  Depending on the condition, your plan could have included both over the counter or prescription medications. If there is a problem please reply  once you have received a response from your provider. Your safety is important to us.  If you have drug allergies check your prescription carefully.    You can use MyChart to ask questions about today's visit, request a non-urgent call back, or ask for a work or school excuse for 24 hours related to this e-Visit. If it has been   greater than 24 hours you will need to follow up with your provider, or enter a new e-Visit to address those concerns. You will get an e-mail in the next two days asking about your experience.  I hope that your e-visit has been valuable and will speed your recovery. Thank you for using e-visits.   

## 2016-10-27 ENCOUNTER — Telehealth: Payer: Self-pay | Admitting: Family Medicine

## 2016-10-27 ENCOUNTER — Ambulatory Visit: Payer: Medicare Other | Admitting: Family Medicine

## 2016-10-27 ENCOUNTER — Other Ambulatory Visit: Payer: Self-pay

## 2016-10-27 MED ORDER — AMOXICILLIN-POT CLAVULANATE 875-125 MG PO TABS: 1.0000 | ORAL_TABLET | Freq: Two times a day (BID) | ORAL | 0 refills | Status: DC

## 2016-10-27 NOTE — Telephone Encounter (Signed)
Transferred to provider

## 2016-10-27 NOTE — Telephone Encounter (Signed)
Patient was seen a few weeks ago for respiratory issues, her daughter called today because she is still very weak and having respiratory issues.  The daughter is hoping since Dr Dossie Arbourrissman seen her just a few weeks ago for these same issues he will be able to call her in something instead of having to bring her out.  She did schedule an appt with Fleet ContrasRachel this afternoon just in case but hoping to be able to cancel it.  Thank You Clydie BraunKaren

## 2016-10-27 NOTE — Telephone Encounter (Signed)
Please advise 

## 2016-10-27 NOTE — Telephone Encounter (Signed)
Call daughter

## 2016-10-27 NOTE — Telephone Encounter (Signed)
Discussion with daughter who is caregiver patient still having cough cold just not getting better having sometimes difficulty with breathing. Will give doxycycline and use low-dose cough syrup to help with patient being so uncomfortable.

## 2016-11-04 ENCOUNTER — Ambulatory Visit (INDEPENDENT_AMBULATORY_CARE_PROVIDER_SITE_OTHER): Payer: Medicare Other | Admitting: Family Medicine

## 2016-11-04 ENCOUNTER — Encounter: Payer: Self-pay | Admitting: Family Medicine

## 2016-11-04 VITALS — BP 130/80 | HR 89 | Temp 98.0°F

## 2016-11-04 DIAGNOSIS — J209 Acute bronchitis, unspecified: Secondary | ICD-10-CM | POA: Diagnosis not present

## 2016-11-04 DIAGNOSIS — G20 Parkinson's disease: Secondary | ICD-10-CM

## 2016-11-04 DIAGNOSIS — J440 Chronic obstructive pulmonary disease with acute lower respiratory infection: Secondary | ICD-10-CM

## 2016-11-04 NOTE — Progress Notes (Signed)
BP 130/80 (BP Location: Right Arm)   Pulse 89   Temp 98 F (36.7 C) (Oral)   SpO2 95%    Subjective:    Patient ID: [name], female    DOB: 1923-07-10, 83 y.o.   MRN: [id number]  HPI: [name] is a 83 y.o. female  Chief Complaint  Patient presents with  . Cough  Patient accompanied by her daughter who provides history as patient is unable to provide any history. Is concerned because yesterday patient was essentially sleeping all day is partially responsive did wake up to eat some dinner. Some slight cough but difficult for her to cough with her Parkinson's and lack of deep breathing. Patient is been doing okay on Augmentin taken faithfully with only minimal side effects had just 1 episode of diarrhea. Is all in all better but was especially concerned yesterday that may have been taken aback slide.  Relevant past medical, surgical, family and social history reviewed and updated as indicated. Interim medical history since our last visit reviewed. Allergies and medications reviewed and updated.  Review of Systems  Constitutional: Positive for activity change. Negative for chills, diaphoresis and fever.  Respiratory: Positive for cough.   Cardiovascular: Negative.     Per HPI unless specifically indicated above     Objective:    BP 130/80 (BP Location: Right Arm)   Pulse 89   Temp 98 F (36.7 C) (Oral)   SpO2 95%   Wt Readings from Last 3 Encounters:  10/13/16 96 lb 6.4 oz (43.7 kg)  06/23/16 110 lb (49.9 kg)  12/24/15 101 lb 1.6 oz (45.9 kg)    Physical Exam  Constitutional: She is oriented to person, place, and time. She appears well-developed and well-nourished. No distress.  HENT:  Head: Normocephalic and atraumatic.  Right Ear: Hearing normal.  Left Ear: Hearing normal.  Nose: Nose normal.  Eyes: Conjunctivae and lids are normal. Right eye exhibits no discharge. Left eye exhibits no discharge. No scleral icterus.  Cardiovascular: Normal rate and  regular rhythm.   Pulmonary/Chest: Effort normal. No respiratory distress.  Difficulty or breath sounds because of patient's shaking with Parkinson's and limited respiratory effort. But appear to be clear.  Musculoskeletal: Normal range of motion.  Neurological: She is alert and oriented to person, place, and time.  Skin: Skin is intact. No rash noted.  Psychiatric: She has a normal mood and affect. Her speech is normal and behavior is normal. Judgment and thought content normal. Cognition and memory are normal.    Results for orders placed or performed during the hospital encounter of 10/13/16  Basic metabolic panel  Result Value Ref Range   Sodium 138 135 - 145 mmol/L   Potassium 3.6 3.5 - 5.1 mmol/L   Chloride 106 101 - 111 mmol/L   CO2 27 22 - 32 mmol/L   Glucose, Bld 121 (H) 65 - 99 mg/dL   BUN 21 (H) 6 - 20 mg/dL   Creatinine, Ser 4.09 0.44 - 1.00 mg/dL   Calcium 9.4 8.9 - 81.1 mg/dL   GFR calc non Af Amer >60 >60 mL/min   GFR calc Af Amer >60 >60 mL/min   Anion gap 5 5 - 15  CBC  Result Value Ref Range   WBC 4.4 3.6 - 11.0 K/uL   RBC 3.77 (L) 3.80 - 5.20 MIL/uL   Hemoglobin 12.0 12.0 - 16.0 g/dL   HCT 91.4 78.2 - 95.6 %   MCV 93.9 80.0 - 100.0 fL  MCH 31.9 26.0 - 34.0 pg   MCHC 34.0 32.0 - 36.0 g/dL   RDW 21.313.2 08.611.5 - 57.814.5 %   Platelets 157 150 - 440 K/uL  Urinalysis, Complete w Microscopic  Result Value Ref Range   Color, Urine YELLOW (A) YELLOW   APPearance CLEAR (A) CLEAR   Specific Gravity, Urine 1.021 1.005 - 1.030   pH 5.0 5.0 - 8.0   Glucose, UA NEGATIVE NEGATIVE mg/dL   Hgb urine dipstick NEGATIVE NEGATIVE   Bilirubin Urine NEGATIVE NEGATIVE   Ketones, ur 5 (A) NEGATIVE mg/dL   Protein, ur 30 (A) NEGATIVE mg/dL   Nitrite NEGATIVE NEGATIVE   Leukocytes, UA NEGATIVE NEGATIVE   RBC / HPF 0-5 0 - 5 RBC/hpf   WBC, UA 0-5 0 - 5 WBC/hpf   Bacteria, UA NONE SEEN NONE SEEN   Squamous Epithelial / LPF 0-5 (A) NONE SEEN   Mucous PRESENT       Assessment &  Plan:   Problem List Items Addressed This Visit      Nervous and Auditory   Parkinson's disease (HCC)    Stable       Other Visit Diagnoses    Acute bronchitis with COPD (HCC)    -  Primary   Bronchitis resolving with antibiotics patient with slow improvement may still have occasional backsliding reviewed with patient's daughter.       Follow up plan: Return for As scheduled.

## 2016-11-04 NOTE — Assessment & Plan Note (Signed)
Stable

## 2016-11-11 ENCOUNTER — Telehealth: Payer: Self-pay | Admitting: Family Medicine

## 2016-11-11 MED ORDER — AZITHROMYCIN 250 MG PO TABS: ORAL_TABLET | ORAL | 0 refills | Status: DC

## 2016-11-11 NOTE — Telephone Encounter (Signed)
Pt was seen by Roosvelt Maserachel Lane on 11/05/15. Per her A&P, "  Acute bronchitis with COPD (HCC)    -  Primary   Bronchitis resolving with antibiotics patient with slow improvement may still have occasional backsliding reviewed with patient's daughter.   "  Please advise.

## 2016-11-11 NOTE — Telephone Encounter (Signed)
Ms Tina LandsbergRenee called and stated that the pt isn't getting any better and would like to know if she could have something sent to [facility] [place].

## 2016-11-11 NOTE — Telephone Encounter (Signed)
rx sent

## 2016-12-18 ENCOUNTER — Encounter: Payer: Self-pay | Admitting: Family Medicine

## 2016-12-18 ENCOUNTER — Telehealth: Payer: Self-pay

## 2016-12-18 ENCOUNTER — Ambulatory Visit (INDEPENDENT_AMBULATORY_CARE_PROVIDER_SITE_OTHER): Payer: Medicare Other | Admitting: Family Medicine

## 2016-12-18 VITALS — HR 140 | Temp 98.4°F

## 2016-12-18 DIAGNOSIS — T814XXA Infection following a procedure, initial encounter: Secondary | ICD-10-CM

## 2016-12-18 DIAGNOSIS — L8990 Pressure ulcer of unspecified site, unspecified stage: Secondary | ICD-10-CM | POA: Insufficient documentation

## 2016-12-18 DIAGNOSIS — L89159 Pressure ulcer of sacral region, unspecified stage: Secondary | ICD-10-CM

## 2016-12-18 DIAGNOSIS — IMO0001 Reserved for inherently not codable concepts without codable children: Secondary | ICD-10-CM

## 2016-12-18 MED ORDER — CHLORHEXIDINE GLUCONATE 4 % EX LIQD: Freq: Every day | CUTANEOUS | 0 refills | Status: DC | PRN

## 2016-12-18 NOTE — Assessment & Plan Note (Signed)
Discussed care and treatment staying off of sore especially can we'll arrange for home health wound care. [facility] cushion and nutrition as best they can.

## 2016-12-18 NOTE — Telephone Encounter (Signed)
Home health referral place. Message sent to [facility] to contact Arma Headingenee Downing, daughter, to schedule for wound care.

## 2016-12-18 NOTE — Progress Notes (Signed)
Pulse (!) 140   Temp 98.4 F (36.9 C) (Oral)   SpO2 98%    Subjective:    Patient ID: [name], female    DOB: [birth year], 83 y.o.   MRN: [id number]  HPI: [name] is a 83 y.o. female  Chief Complaint  Patient presents with  . Pressure sore   Patient accompanied by her daughter and caregiver who provides history as patient is unable to provide any history. Over the last month or so patient was total care has developed a sacral pressure sore. They have been treating this with pads and pressure relief to no avail now smells bad and has some black material around. Patient otherwise stable eating okay Relevant past medical, surgical, family and social history reviewed and updated as indicated. Interim medical history since our last visit reviewed. Allergies and medications reviewed and updated.  Review of Systems  Constitutional: Negative for activity change.  Respiratory: Negative.   Cardiovascular: Negative.     Per HPI unless specifically indicated above     Objective:    Pulse (!) 140   Temp 98.4 F (36.9 C) (Oral)   SpO2 98%   Wt Readings from Last 3 Encounters:  10/13/16 96 lb 6.4 oz (43.7 kg)  06/23/16 110 lb (49.9 kg)  12/24/15 101 lb 1.6 oz (45.9 kg)    Physical Exam  Skin: She is not diaphoretic.  3 cm sacral ulcer with some necrotic debris.    Results for orders placed or performed during the hospital encounter of 10/13/16  Basic metabolic panel  Result Value Ref Range   Sodium 138 135 - 145 mmol/L   Potassium 3.6 3.5 - 5.1 mmol/L   Chloride 106 101 - 111 mmol/L   CO2 27 22 - 32 mmol/L   Glucose, Bld 121 (H) 65 - 99 mg/dL   BUN 21 (H) 6 - 20 mg/dL   Creatinine, Ser 8.46 0.44 - 1.00 mg/dL   Calcium 9.4 8.9 - 96.2 mg/dL   GFR calc non Af Amer >60 >60 mL/min   GFR calc Af Amer >60 >60 mL/min   Anion gap 5 5 - 15  CBC  Result Value Ref Range   WBC 4.4 3.6 - 11.0 K/uL   RBC 3.77 (L) 3.80 - 5.20 MIL/uL   Hemoglobin 12.0 12.0 - 16.0  g/dL   HCT 95.2 84.1 - 32.4 %   MCV 93.9 80.0 - 100.0 fL   MCH 31.9 26.0 - 34.0 pg   MCHC 34.0 32.0 - 36.0 g/dL   RDW 40.1 02.7 - 25.3 %   Platelets 157 150 - 440 K/uL  Urinalysis, Complete w Microscopic  Result Value Ref Range   Color, Urine YELLOW (A) YELLOW   APPearance CLEAR (A) CLEAR   Specific Gravity, Urine 1.021 1.005 - 1.030   pH 5.0 5.0 - 8.0   Glucose, UA NEGATIVE NEGATIVE mg/dL   Hgb urine dipstick NEGATIVE NEGATIVE   Bilirubin Urine NEGATIVE NEGATIVE   Ketones, ur 5 (A) NEGATIVE mg/dL   Protein, ur 30 (A) NEGATIVE mg/dL   Nitrite NEGATIVE NEGATIVE   Leukocytes, UA NEGATIVE NEGATIVE   RBC / HPF 0-5 0 - 5 RBC/hpf   WBC, UA 0-5 0 - 5 WBC/hpf   Bacteria, UA NONE SEEN NONE SEEN   Squamous Epithelial / LPF 0-5 (A) NONE SEEN   Mucous PRESENT       Assessment & Plan:   Problem List Items Addressed This Visit  Musculoskeletal and Integument   Bedsore    Discussed care and treatment staying off of sore especially can we'll arrange for home health wound care. Doughnut cushion and nutrition as best they can.       Other Visit Diagnoses    Wound abscess, initial encounter    -  Primary   Relevant Orders   Ambulatory referral to Home Health       Follow up plan: Return for As scheduled.

## 2016-12-19 ENCOUNTER — Telehealth: Payer: Self-pay | Admitting: Family Medicine

## 2016-12-19 NOTE — Telephone Encounter (Signed)
Verdon CumminsJesse the nurse with [facility] called regarding the certification he did on patient today  He is faxing over the Plan of Care regarding wound care/nursing visit  If any questions Verdon CumminsJesse can be reached at [phone]   Thank You

## 2016-12-23 ENCOUNTER — Telehealth: Payer: Self-pay | Admitting: Family Medicine

## 2016-12-23 NOTE — Telephone Encounter (Signed)
Tina [facility]Jesse [facility] and would like to have santyl ointment sent in to [facility] [address]

## 2016-12-24 NOTE — Telephone Encounter (Signed)
Please advise 

## 2016-12-24 NOTE — Telephone Encounter (Signed)
Patients daughter called to see if Dr Dossie Arbourrissman had called in the santyl ointment for the patients bedsore.   [facility] [address]  Thanks

## 2016-12-25 MED ORDER — COLLAGENASE 250 UNIT/GM EX OINT: 1.0000 "application " | TOPICAL_OINTMENT | Freq: Every day | CUTANEOUS | 0 refills | Status: DC

## 2016-12-25 NOTE — Telephone Encounter (Signed)
Ointment sent in. 

## 2017-01-05 ENCOUNTER — Telehealth (INDEPENDENT_AMBULATORY_CARE_PROVIDER_SITE_OTHER): Payer: Medicare Other | Admitting: Family Medicine

## 2017-01-05 DIAGNOSIS — L89159 Pressure ulcer of sacral region, unspecified stage: Secondary | ICD-10-CM

## 2017-01-05 NOTE — Telephone Encounter (Signed)
Jesse aware  

## 2017-01-05 NOTE — Telephone Encounter (Signed)
Please advise 

## 2017-01-05 NOTE — Telephone Encounter (Signed)
FYI only due to referrals

## 2017-01-05 NOTE — Telephone Encounter (Signed)
Both of those ordered.  Thanks

## 2017-01-06 NOTE — Telephone Encounter (Signed)
[facility]ointment [facility]

## 2017-01-07 ENCOUNTER — Encounter: Payer: Medicare Other | Attending: Internal Medicine | Admitting: Internal Medicine

## 2017-01-07 DIAGNOSIS — Z882 Allergy status to sulfonamides status: Secondary | ICD-10-CM | POA: Diagnosis not present

## 2017-01-07 DIAGNOSIS — L89150 Pressure ulcer of sacral region, unstageable: Secondary | ICD-10-CM | POA: Insufficient documentation

## 2017-01-07 DIAGNOSIS — I10 Essential (primary) hypertension: Secondary | ICD-10-CM | POA: Diagnosis not present

## 2017-01-07 DIAGNOSIS — G20 Parkinson's disease: Secondary | ICD-10-CM | POA: Insufficient documentation

## 2017-01-07 DIAGNOSIS — D649 Anemia, unspecified: Secondary | ICD-10-CM | POA: Insufficient documentation

## 2017-01-07 DIAGNOSIS — F0390 Unspecified dementia without behavioral disturbance: Secondary | ICD-10-CM | POA: Diagnosis not present

## 2017-01-09 NOTE — Progress Notes (Signed)
Tina, [name] ([id number]) Visit Report for 01/07/2017 Abuse/Suicide Risk Screen Details Patient Name: [name], [name] Date of Service: 01/07/2017 8:45 AM Medical Record Patient Account Number: [id number] Number: Treating RN: Curtis Sites [birth year] (83 y.o. Other Clinician: Date of Birth/Sex: Female) Treating ROBSON, MICHAEL Primary Care Othel Hoogendoorn: Vonita Moss Jendaya Gossett/Extender: G Referring Graves Nipp: Vonita Moss Weeks in Treatment: 0 Abuse/Suicide Risk Screen Items Answer ABUSE/SUICIDE RISK SCREEN: Has anyone close to you tried to hurt or harm you recentlyo No Do you feel uncomfortable with anyone in your familyo No Has anyone forced you do things that you didnot want to doo No Do you have any thoughts of harming yourselfo No Patient displays signs or symptoms of abuse and/or neglect. No Electronic Signature(s) Signed: 01/07/2017 5:10:19 PM By: Curtis Sites Entered By: Curtis Sites on 01/07/2017 09:12:32 [name] ([id number]) -------------------------------------------------------------------------------- Activities of Daily Living Details Patient Name: [name] Date of Service: 01/07/2017 8:45 AM Medical Record Patient Account Number: [id number] Number: Treating RN: Curtis Sites 1923/06/20 (83 y.o. Other Clinician: Date of Birth/Sex: Female) Treating ROBSON, MICHAEL Primary Care Kail Fraley: Vonita Moss Damari Suastegui/Extender: G Referring Ganesh Deeg: Vonita Moss Weeks in Treatment: 0 Activities of Daily Living Items Answer Activities of Daily Living (Please select one for each item) Drive Automobile Not Able Take Medications Need Assistance Use Telephone Not Able Care for Appearance Need Assistance Use Toilet Need Assistance Bath / Shower Need Assistance Dress Self Need Assistance Feed Self Need Assistance Walk Need Assistance Get In / Out Bed Need Assistance Housework Not Able Prepare Meals Not Able Handle Money  Not Able Shop for Self Not Able Electronic Signature(s) Signed: 01/07/2017 5:10:19 PM By: Curtis Sites Entered By: Curtis Sites on 01/07/2017 09:13:28 [name] ([id number]) -------------------------------------------------------------------------------- Education Assessment Details Patient Name: [name] Date of Service: 01/07/2017 8:45 AM Medical Record Patient Account Number: [id number] Number: Treating RN: Curtis Sites 27-Oct-1922 (83 y.o. Other Clinician: Date of Birth/Sex: Female) Treating ROBSON, MICHAEL Primary Care Amaliya Whitelaw: Vonita Moss Caswell Alvillar/Extender: G Referring Chakia Counts: Vonita Moss Weeks in Treatment: 0 Primary Learner Assessed: Caregiver wound location and Reason Patient is not Primary Learner: dementia Learning Preferences/Education Level/Primary Language Learning Preference: Explanation, Demonstration Highest Education Level: College or Above Preferred Language: English Cognitive Barrier Assessment/Beliefs Language Barrier: No Translator Needed: No Memory Deficit: No Emotional Barrier: No Cultural/Religious Beliefs Affecting Medical No Care: Physical Barrier Assessment Impaired Vision: No Impaired Hearing: No Decreased Hand dexterity: No Knowledge/Comprehension Assessment Knowledge Level: Medium Comprehension Level: Medium Ability to understand written Medium instructions: Ability to understand verbal Medium instructions: Motivation Assessment Anxiety Level: Calm Cooperation: Cooperative Education Importance: Acknowledges Need Interest in Health Problems: Asks Questions Perception: Coherent Willingness to Engage in Self- Medium Management Activities: DESHUNDA, THACKSTON ([id number]) Readiness to Engage in Self- Medium Management Activities: Electronic Signature(s) Signed: 01/07/2017 5:10:19 PM By: Curtis Sites Entered By: Curtis Sites on 01/07/2017 09:14:12 [name]  ([id number]) -------------------------------------------------------------------------------- Fall Risk Assessment Details Patient Name: [name] Date of Service: 01/07/2017 8:45 AM Medical Record Patient Account Number: [id number] Number: Treating RN: Curtis Sites Jan 12, 1923 (83 y.o. Other Clinician: Date of Birth/Sex: Female) Treating ROBSON, MICHAEL Primary Care Redding Cloe: Vonita Moss Zerina Hallinan/Extender: G Referring Ansen Sayegh: Vonita Moss Weeks in Treatment: 0 Fall Risk Assessment Items Have you had 2 or more falls in the last 12 monthso 0 No Have you had any fall that resulted in injury in the last 12 monthso 0 No FALL RISK ASSESSMENT: History of falling - immediate or within 3 months 0 No  Secondary diagnosis 0 No Ambulatory aid None/bed rest/wheelchair/nurse 0 Yes Crutches/cane/walker 0 No Furniture 0 No IV Access/Saline Lock 0 No Gait/Training Normal/bed rest/immobile 0 No Weak 10 Yes Impaired 0 No Mental Status Oriented to own ability 0 No Electronic Signature(s) Signed: 01/07/2017 5:10:19 PM By: Curtis Sites Entered By: Curtis Sites on 01/07/2017 09:14:31 [name] ([id number]) -------------------------------------------------------------------------------- Nutrition Risk Assessment Details Patient Name: [name] Date of Service: 01/07/2017 8:45 AM Medical Record Patient Account Number: [id number] Number: Treating RN: Curtis Sites 1922/10/17 (83 y.o. Other Clinician: Date of Birth/Sex: Female) Treating ROBSON, MICHAEL Primary Care Amorah Sebring: Vonita Moss Edison Nicholson/Extender: G Referring Rayvion Stumph: Vonita Moss Weeks in Treatment: 0 Height (in): Weight (lbs): Body Mass Index (BMI): Nutrition Risk Assessment Items NUTRITION RISK SCREEN: I have an illness or condition that made me change the kind and/or 2 Yes amount of food I eat I eat fewer than two meals per day 0 No I eat few fruits and vegetables, or  milk products 0 No I have three or more drinks of beer, liquor or wine almost every day 0 No I have tooth or mouth problems that make it hard for me to eat 0 No I don't always have enough money to buy the food I need 0 No I eat alone most of the time 0 No I take three or more different prescribed or over-the-counter drugs a 1 Yes day Without wanting to, I have lost or gained 10 pounds in the last six 0 No months I am not always physically able to shop, cook and/or feed myself 0 No Nutrition Protocols Good Risk Protocol Provide education on Moderate Risk Protocol 0 nutrition Electronic Signature(s) Signed: 01/07/2017 5:10:19 PM By: Curtis Sites Entered By: Curtis Sites on 01/07/2017 09:14:45

## 2017-01-09 NOTE — Progress Notes (Signed)
CHERRELL, MAYBEE ([id number]) Visit Report for 01/07/2017 Chief Complaint Document Details Patient Name: [name], [name] Date of Service: 01/07/2017 8:45 AM Medical Record Patient Account Number: [id number] Number: Treating RN: Curtis Sites 1922-10-26 (83 y.o. Other Clinician: Date of Birth/Sex: Female) Treating Aiden Helzer Primary Care Provider: Vonita Moss Provider/Extender: G Referring Provider: Vonita Moss Weeks in Treatment: 0 Information Obtained from: Patient Chief Complaint 01/07/17; patient is here for review of a sacral pressure ulcer Electronic Signature(s) Signed: 01/07/2017 5:13:56 PM By: Baltazar Najjar MD Entered By: Baltazar Najjar on 01/07/2017 10:18:04 [name] ([id number]) -------------------------------------------------------------------------------- Debridement Details Patient Name: [name] Date of Service: 01/07/2017 8:45 AM Medical Record Patient Account Number: [id number] Number: Treating RN: Curtis Sites 06-21-1923 (83 y.o. Other Clinician: Date of Birth/Sex: Female) Treating Darien Mignogna Primary Care Provider: Vonita Moss Provider/Extender: G Referring Provider: Vonita Moss Weeks in Treatment: 0 Debridement Performed for Wound #1 Midline Coccyx Assessment: Performed By: Physician Maxwell Caul, MD Debridement: Debridement Pre-procedure Yes - 09:50 Verification/Time Out Taken: Start Time: 09:50 Pain Control: Lidocaine 4% Topical Solution Level: Skin/Subcutaneous Tissue/Muscle Total Area Debrided (L x 2.3 (cm) x 2.3 (cm) = 5.29 (cm) W): Tissue and other Viable, Non-Viable, Eschar, Fibrin/Slough, Muscle, Subcutaneous material debrided: Instrument: Curette, Forceps Bleeding: Moderate Hemostasis Achieved: Pressure End Time: 09:53 Procedural Pain: 0 Post Procedural Pain: 0 Response to Treatment: Procedure was tolerated well Post Debridement Measurements of Total Wound Length:  (cm) 2.3 Stage: Unstageable/Unclassified Width: (cm) 2.3 Depth: (cm) 0.6 Volume: (cm) 2.493 Character of Wound/Ulcer Post Improved Debridement: Severity of Tissue Post Necrosis of muscle Debridement: Post Procedure Diagnosis Same as Pre-procedure Electronic Signature(s) Signed: 01/07/2017 5:10:19 PM By: Aileen Fass ([id number]) Signed: 01/07/2017 5:13:56 PM By: Baltazar Najjar MD Entered By: Baltazar Najjar on 01/07/2017 10:17:41 [name] ([id number]) -------------------------------------------------------------------------------- HPI Details Patient Name: [name] Date of Service: 01/07/2017 8:45 AM Medical Record Patient Account Number: [id number] Number: Treating RN: Curtis Sites 09-27-1923 (83 y.o. Other Clinician: Date of Birth/Sex: Female) Treating Dijon Kohlman Primary Care Provider: Vonita Moss Provider/Extender: G Referring Provider: Vonita Moss Weeks in Treatment: 0 History of Present Illness HPI Description: 01/07/17 this is a 83 year old woman admitted to the clinic today for review of a pressure ulcer on her lower sacrum. She is referred from her primary physician's office after being seen on 3/22 with a 3 cm pressure area. Her daughter and caretaker accompanied her today state that the area first became obvious about a month ago and his since deteriorated. They have recently got [name] a home health involved and have been using [name] to the wound. They have ordered a pressure relief surface for her mattress. They are turning her religiously. They state that she eats well and they've been forcing fluids on her. She is on a multivitamin. Looking through [facility] point last albumin I see was 4.4 on 10/28. The patient has advanced parkinsonism which looks superficially like advanced Parkinson's disease although her daughter tells me she did not ever respond to Sinemet therefore this may have another  pathology with signs of parkinsonism. However I think this is largely a mute point currently. She also has dementia and is nonambulatory. Since this started they have been keeping her in bed and turning her religiously every 2 hours. She lives at home in [place] with her husband with 24/7 care giving Electronic Signature(s) Signed: 01/07/2017 5:13:56 PM By: Baltazar Najjar MD Entered By: Baltazar Najjar on 01/07/2017 10:21:04 [name] ([id number]) --------------------------------------------------------------------------------  Physical Exam Details Patient Name: [name], [name] Date of Service: 01/07/2017 8:45 AM Medical Record Patient Account Number: [id number] Number: Treating RN: Curtis Sites 1923/08/09 (83 y.o. Other Clinician: Date of Birth/Sex: Female) Treating Kaislyn Gulas Primary Care Provider: Vonita Moss Provider/Extender: G Referring Provider: Vonita Moss Weeks in Treatment: 0 Constitutional Patient is hypertensive.. Pulse regular and within target range for patient.Marland Kitchen Respirations regular, non-labored and within target range.. Temperature is normal and within the target range for the patient.. Very frail- looking elderly woman Parkinsonian tremor. Eyes Conjunctivae clear. No discharge. No scleral icterus. Ears, Nose, Mouth, and Throat Oropharynx within normal limits, without erythema, exudate or ulceration.. Neck Neck supple and symmetrical. No masses or crepitus. Respiratory Respiratory effort is easy and symmetric bilaterally. Rate is normal at rest and on room air.. Shallow but no crackles or wheezes. Cardiovascular Heart rhythm and rate regular, without murmur or gallop. Does not appear dehydrated. Gastrointestinal (GI) Abdomen is soft and non-distended without masses or tenderness. Bowel sounds active in all quadrants.. No liver or spleen enlargement or tenderness.. Genitourinary (GU) Bladder without fullness, masses or  tenderness.. Lymphatic Nonpalpable in the cervical or axillary areas. Musculoskeletal Probable frozen shoulder on the left related to prior shoulder dislocation and's fracture per her daughter. Integumentary (Hair, Skin) No rashes seen no pressure ulcers on her heel. She does have erythema moving toward the right buttock however there is no palpable tenderness or crepitus. Neurological Severe parkinsonism with a parkinsonian-looking tremor in both upper extremities. Severe rigidity. Notes Wound exam; the areas over the lower sacrum. Covered in a thick blackened necrotic eschar therefore by definition this is an unstageable wound. There was no surrounding crepitus or tenderness. Moving towards the right buttock there was raised erythematous skin which I think is probably a fungal infection rather than [name], [name]. ([id number]) cellulitis or perhaps stage I pressure injury as well. Using a #5 curet the area was vigorously debrided of necrotic surface and subcutaneous/muscle material. There is viable muscle underneath this therefore I think this will turn out to be a stage III wound there was no palpable bone Electronic Signature(s) Signed: 01/07/2017 5:13:56 PM By: Baltazar Najjar MD Entered By: Baltazar Najjar on 01/07/2017 10:24:53 [name] ([id number]) -------------------------------------------------------------------------------- Physician Orders Details Patient Name: [name] Date of Service: 01/07/2017 8:45 AM Medical Record Patient Account Number: [id number] Number: Treating RN: Curtis Sites [birth year] (83 y.o. Other Clinician: Date of Birth/Sex: Female) Treating Orma Cheetham Primary Care Provider: Vonita Moss Provider/Extender: G Referring Provider: Vonita Moss Weeks in Treatment: 0 Verbal / Phone Orders: No Diagnosis Coding Wound Cleansing Wound #1 Midline Coccyx o Clean wound with Normal Saline. o May Shower, gently pat  wound dry prior to applying new dressing. Anesthetic Wound #1 Midline Coccyx o Topical Lidocaine 4% cream applied to wound bed prior to debridement Skin Barriers/Peri-Wound Care Wound #1 Midline Coccyx o Skin Prep Primary Wound Dressing Wound #1 Midline Coccyx o [name] Ointment Secondary Dressing Wound #1 Midline Coccyx o Dry Gauze o Boardered Foam Dressing Dressing Change Frequency Wound #1 Midline Coccyx o Change dressing every day. Follow-up Appointments Wound #1 Midline Coccyx o Return Appointment in 1 week. Off-Loading Wound #1 Midline Coccyx [name], [name]. ([id number]) o Roho cushion for wheelchair - [facility] please order rojo cushion or gel cushion for patients wheelchair - whichever she qualifies for o Turn and reposition every 2 hours Additional Orders / Instructions Wound #1 Midline Coccyx o Increase protein intake. - please add protein supplements to patients diet   o Other: - please add vitamin A, vitamin C and zinc supplements to patients diet Home Health Wound #1 Midline Coccyx o Continue Home Health Visits o Home Health Nurse may visit PRN to address patientos wound care needs. o FACE TO FACE ENCOUNTER: MEDICARE and MEDICAID PATIENTS: I certify that this patient is under my care and that I had a face-to-face encounter that meets the physician face-to-face encounter requirements with this patient on this date. The encounter with the patient was in whole or in part for the following MEDICAL CONDITION: (primary reason for Home Healthcare) MEDICAL NECESSITY: I certify, that based on my findings, NURSING services are a medically necessary home health service. HOME BOUND STATUS: I certify that my clinical findings support that this patient is homebound (i.e., Due to illness or injury, pt requires aid of supportive devices such as crutches, cane, wheelchairs, walkers, the use of special transportation or the assistance of another person to  leave their place of residence. There is a normal inability to leave the home and doing so requires considerable and taxing effort. Other absences are for medical reasons / religious services and are infrequent or of short duration when for other reasons). o If current dressing causes regression in wound condition, may D/C ordered dressing product/s and apply Normal Saline Moist Dressing daily until next Wound Healing Center / Other MD appointment. Notify Wound Healing Center of regression in wound condition at [phone]. o Please direct any NON-WOUND related issues/requests for orders to patient's Primary Care Physician Medications-please add to medication list. Wound #1 Midline Coccyx o [name] Enzymatic Ointment Electronic Signature(s) Signed: 01/07/2017 5:10:19 PM By: Curtis Sites Signed: 01/07/2017 5:13:56 PM By: Baltazar Najjar MD Entered By: Curtis Sites on 01/07/2017 09:56:10 [name] ([id number]) -------------------------------------------------------------------------------- Problem List Details Patient Name: [name] Date of Service: 01/07/2017 8:45 AM Medical Record Patient Account Number: [id number] Number: Treating RN: Curtis Sites May 29, 1923 (83 y.o. Other Clinician: Date of Birth/Sex: Female) Treating Jimmy Plessinger Primary Care Provider: Vonita Moss Provider/Extender: G Referring Provider: Vonita Moss Weeks in Treatment: 0 Active Problems ICD-10 Encounter Code Description Active Date Diagnosis L89.150 Pressure ulcer of sacral region, unstageable 01/07/2017 Yes G20 Parkinson's disease 01/07/2017 Yes Inactive Problems Resolved Problems Electronic Signature(s) Signed: 01/07/2017 5:13:56 PM By: Baltazar Najjar MD Entered By: Baltazar Najjar on 01/07/2017 10:17:21 [name] ([id number]) -------------------------------------------------------------------------------- Progress Note Details Patient Name: [name] Date of Service: 01/07/2017 8:45 AM Medical Record Patient Account Number: [id number] Number: Treating RN: Curtis Sites 15-Dec-1922 (83 y.o. Other Clinician: Date of Birth/Sex: Female) Treating Imunique Samad Primary Care Provider: Vonita Moss Provider/Extender: G Referring Provider: Vonita Moss Weeks in Treatment: 0 Subjective Chief Complaint Information obtained from Patient 01/07/17; patient is here for review of a sacral pressure ulcer History of Present Illness (HPI) 01/07/17 this is a 83 year old woman admitted to the clinic today for review of a pressure ulcer on her lower sacrum. She is referred from her primary physician's office after being seen on 3/22 with a 3 cm pressure area. Her daughter and caretaker accompanied her today state that the area first became obvious about a month ago and his since deteriorated. They have recently got [name] a home health involved and have been using [name] to the wound. They have ordered a pressure relief surface for her mattress. They are turning her religiously. They state that she eats well and they've been forcing fluids on her. She is on a multivitamin. Looking through [facility] point last albumin I  see was 4.4 on 10/28. The patient has advanced parkinsonism which looks superficially like advanced Parkinson's disease although her daughter tells me she did not ever respond to Sinemet therefore this may have another pathology with signs of parkinsonism. However I think this is largely a mute point currently. She also has dementia and is nonambulatory. Since this started they have been keeping her in bed and turning her religiously every 2 hours. She lives at home in [place] with her husband with 24/7 care giving Wound History Patient presents with 1 open wound that has been present for approximately 1 week. Patient has been treating wound in the following manner: santyl and gauze. Laboratory tests have not  been performed in the last month. Patient reportedly has not tested positive for an antibiotic resistant organism. Patient reportedly has not tested positive for osteomyelitis. Patient reportedly has not had testing performed to evaluate circulation in the legs. Patient History Information obtained from Caregiver. Allergies Phenergan, Sulfa (Sulfonamide Antibiotics) Social History Never smoker, Marital Status - Married, Alcohol Use - Never, Drug Use - No History, Caffeine Use - Never. [name], [name] ([id number]) Medical History Eyes Denies history of Cataracts, Glaucoma, Optic Neuritis Ear/Nose/Mouth/Throat Denies history of Chronic sinus problems/congestion, Middle ear problems Hematologic/Lymphatic Patient has history of Anemia Denies history of Hemophilia, Human Immunodeficiency Virus, Lymphedema, Sickle Cell Disease Respiratory Denies history of Aspiration, Asthma, Chronic Obstructive Pulmonary Disease (COPD), Pneumothorax, Sleep Apnea, Tuberculosis Cardiovascular Patient has history of Hypertension Denies history of Angina, Arrhythmia, Congestive Heart Failure, Coronary Artery Disease, Deep Vein Thrombosis, Hypotension, Myocardial Infarction, Peripheral Arterial Disease, Peripheral Venous Disease, Phlebitis, Vasculitis Gastrointestinal Denies history of Cirrhosis , Colitis, Crohn s, Hepatitis A, Hepatitis B, Hepatitis C Endocrine Denies history of Type I Diabetes, Type II Diabetes Genitourinary Denies history of End Stage Renal Disease Immunological Denies history of Lupus Erythematosus, Raynaud s, Scleroderma Integumentary (Skin) Denies history of History of Burn, History of pressure wounds Musculoskeletal Denies history of Gout, Rheumatoid Arthritis, Osteoarthritis, Osteomyelitis Neurologic Patient has history of Dementia Denies history of Neuropathy, Quadriplegia, Paraplegia, Seizure Disorder Oncologic Denies history of Received Chemotherapy, Received  Radiation Psychiatric Denies history of Anorexia/bulimia, Confinement Anxiety Medical And Surgical History Notes Ear/Nose/Mouth/Throat dysphagia Neurologic parkinsons, tremor Review of Systems (ROS) Constitutional Symptoms (General Health) The patient has no complaints or symptoms. Eyes The patient has no complaints or symptoms. Ear/Nose/Mouth/Throat The patient has no complaints or symptoms. Hematologic/Lymphatic [name], [name] ([id number]) The patient has no complaints or symptoms. Respiratory The patient has no complaints or symptoms. Cardiovascular Complains or has symptoms of LE edema. Endocrine The patient has no complaints or symptoms. Genitourinary The patient has no complaints or symptoms. Immunological Denies complaints or symptoms of Hives, Itching. Integumentary (Skin) The patient has no complaints or symptoms. Musculoskeletal Denies complaints or symptoms of Muscle Pain, Muscle Weakness. Neurologic The patient has no complaints or symptoms. Oncologic The patient has no complaints or symptoms. Psychiatric The patient has no complaints or symptoms. Objective Constitutional Patient is hypertensive.. Pulse regular and within target range for patient.Marland Kitchen Respirations regular, non-labored and within target range.. Temperature is normal and within the target range for the patient.. Very frail- looking elderly woman Parkinsonian tremor. Vitals Time Taken: 9:02 AM, Temperature: 98.8 F, Pulse: 75 bpm, Respiratory Rate: 18 breaths/min, Blood Pressure: 154/91 mmHg. Eyes Conjunctivae clear. No discharge. No scleral icterus. Ears, Nose, Mouth, and Throat Oropharynx within normal limits, without erythema, exudate or ulceration.. Neck Neck supple and symmetrical. No masses or crepitus. Respiratory Respiratory effort is easy and symmetric  bilaterally. Rate is normal at rest and on room air.. Shallow but no crackles or wheezes. KEDRA, MCGLADE  ([id number]) Cardiovascular Heart rhythm and rate regular, without murmur or gallop. Does not appear dehydrated. Gastrointestinal (GI) Abdomen is soft and non-distended without masses or tenderness. Bowel sounds active in all quadrants.. No liver or spleen enlargement or tenderness.. Genitourinary (GU) Bladder without fullness, masses or tenderness.. Lymphatic Nonpalpable in the cervical or axillary areas. Musculoskeletal Probable frozen shoulder on the left related to prior shoulder dislocation and's fracture per her daughter. Neurological Severe parkinsonism with a parkinsonian-looking tremor in both upper extremities. Severe rigidity. General Notes: Wound exam; the areas over the lower sacrum. Covered in a thick blackened necrotic eschar therefore by definition this is an unstageable wound. There was no surrounding crepitus or tenderness. Moving towards the right buttock there was raised erythematous skin which I think is probably a fungal infection rather than cellulitis or perhaps stage I pressure injury as well. Using a #5 curet the area was vigorously debrided of necrotic surface and subcutaneous/muscle material. There is viable muscle underneath this therefore I think this will turn out to be a stage III wound there was no palpable bone Integumentary (Hair, Skin) No rashes seen no pressure ulcers on her heel. She does have erythema moving toward the right buttock however there is no palpable tenderness or crepitus. Wound #1 status is Open. Original cause of wound was Pressure Injury. The wound is located on the Midline Coccyx. The wound measures 2.3cm length x 2.3cm width x 0.4cm depth; 4.155cm^2 area and 1.662cm^3 volume. The wound is limited to skin breakdown. There is no tunneling or undermining noted. There is a large amount of serous drainage noted. The wound margin is flat and intact. There is no granulation within the wound bed. There is a large (67-100%) amount of necrotic  tissue within the wound bed including Eschar and Adherent Slough. The periwound skin appearance exhibited: Erythema. The periwound skin appearance did not exhibit: Callus, Crepitus, Excoriation, Induration, Rash, Scarring, Dry/Scaly, Maceration, Atrophie Blanche, Cyanosis, Ecchymosis, Hemosiderin Staining, Mottled, Pallor, Rubor. The surrounding wound skin color is noted with erythema which is circumferential. The periwound has tenderness on palpation. Assessment Active Problems TANDA, MORRISSEY ([id number]) ICD-10 L89.150 - Pressure ulcer of sacral region, unstageable G20 - Parkinson's disease Procedures Wound #1 Wound #1 is a Pressure Ulcer located on the Midline Coccyx . There was a Skin/Subcutaneous Tissue/Muscle Debridement ([id number]) debridement with total area of 5.29 sq cm performed by Maxwell Caul, MD. with the following instrument(s): Curette and Forceps to remove Viable and Non-Viable tissue/material including Fibrin/Slough, Muscle, Eschar, and Subcutaneous after achieving pain control using Lidocaine 4% Topical Solution. A time out was conducted at 09:50, prior to the start of the procedure. A Moderate amount of bleeding was controlled with Pressure. The procedure was tolerated well with a pain level of 0 throughout and a pain level of 0 following the procedure. Post Debridement Measurements: 2.3cm length x 2.3cm width x 0.6cm depth; 2.493cm^3 volume. Post debridement Stage noted as Unstageable/Unclassified. Character of Wound/Ulcer Post Debridement is improved. Severity of Tissue Post Debridement is: Necrosis of muscle. Post procedure Diagnosis Wound #1: Same as Pre-Procedure #5 curet vigorous debridement of necrotic surface subcutaneous and muscle tissue. Most of the material that is visible underneath his muscle and looks to be healthy. Hemostasis with direct pressure, she tolerated this well Plan Wound Cleansing: Wound #1 Midline Coccyx: Clean wound with Normal  Saline. May Shower, gently pat wound dry prior  to applying new dressing. Anesthetic: Wound #1 Midline Coccyx: Topical Lidocaine 4% cream applied to wound bed prior to debridement Skin Barriers/Peri-Wound Care: Wound #1 Midline Coccyx: Skin Prep Primary Wound Dressing: Wound #1 Midline Coccyx: [name], [name] ([id number]) Secondary Dressing: Wound #1 Midline Coccyx: Dry Gauze Boardered Foam Dressing Dressing Change Frequency: Wound #1 Midline Coccyx: Change dressing every day. Follow-up Appointments: Wound #1 Midline Coccyx: Return Appointment in 1 week. Off-Loading: Wound #1 Midline Coccyx: Roho cushion for wheelchair - [facility] please order rojo cushion or gel cushion for patients wheelchair - whichever she qualifies for Turn and reposition every 2 hours Additional Orders / Instructions: Wound #1 Midline Coccyx: Increase protein intake. - please add protein supplements to patients diet Other: - please add vitamin A, vitamin C and zinc supplements to patients diet Home Health: Wound #1 Midline Coccyx: Continue Home Health Visits Home Health Nurse may visit PRN to address patient s wound care needs. FACE TO FACE ENCOUNTER: MEDICARE and MEDICAID PATIENTS: I certify that this patient is under my care and that I had a face-to-face encounter that meets the physician face-to-face encounter requirements with this patient on this date. The encounter with the patient was in whole or in part for the following MEDICAL CONDITION: (primary reason for Home Healthcare) MEDICAL NECESSITY: I certify, that based on my findings, NURSING services are a medically necessary home health service. HOME BOUND STATUS: I certify that my clinical findings support that this patient is homebound (i.e., Due to illness or injury, pt requires aid of supportive devices such as crutches, cane, wheelchairs, walkers, the use of special transportation or the assistance of another person to leave  their place of residence. There is a normal inability to leave the home and doing so requires considerable and taxing effort. Other absences are for medical reasons / religious services and are infrequent or of short duration when for other reasons). If current dressing causes regression in wound condition, may D/C ordered dressing product/s and apply Normal Saline Moist Dressing daily until next Wound Healing Center / Other MD appointment. Notify Wound Healing Center of regression in wound condition at [phone]. Please direct any NON-WOUND related issues/requests for orders to patient's Primary Care Physician Medications-please add to medication list.: Wound #1 Midline Coccyx: [name] Enzymatic Ointment #1 unstageable pressure ulcer at presentation #2 they are already using [name] which is appropriate and should continue daily [name], [name]. ([id number]) #3 although the patient is said to be eating well by her daughter he is certainly wonder looking at her. I didn't see any recent lab work in Epic #4 the erythema going to the right side of her buttock from the wound looks more like a fungal infection to me than cellulitis or even a stage I pressure area although the latter 2 are technically possible. This is not tender there is no crepitus. I've asked them to carefully observe this area and I marked the area. I've asked him to use Lotrimin cream on it twice a day #5 advanced parkinsonism/Parkinson's disease. She never really responded to Sinemet which would be unusual for idiopathic Parkinson's disease however this is a moot point at this point in time. She is still on Sinemet Electronic Signature(s) Signed: 01/07/2017 5:13:56 PM By: Baltazar Najjar MD Entered By: Baltazar Najjar on 01/07/2017 10:29:23 [name] ([id number]) -------------------------------------------------------------------------------- ROS/PFSH Details Patient Name: [name] Date of Service:  01/07/2017 8:45 AM Medical Record Patient Account Number: [id number] Number: Treating RN: Curtis Sites Jul 19, 1923 (83 y.o.  Other Clinician: Date of Birth/Sex: Female) Treating Kierra Jezewski Primary Care Provider: Vonita Moss Provider/Extender: G Referring Provider: Vonita Moss Weeks in Treatment: 0 Information Obtained From Caregiver Wound History Do you currently have one or more open woundso Yes How many open wounds do you currently haveo 1 Approximately how long have you had your woundso 1 week How have you been treating your wound(s) until nowo santyl and gauze Has your wound(s) ever healed and then re-openedo No Have you had any lab work done in the past montho No Have you tested positive for an antibiotic resistant organism (MRSA, VRE)o No Have you tested positive for osteomyelitis (bone infection)o No Have you had any tests for circulation on your legso No Cardiovascular Complaints and Symptoms: Positive for: LE edema Medical History: Positive for: Hypertension Negative for: Angina; Arrhythmia; Congestive Heart Failure; Coronary Artery Disease; Deep Vein Thrombosis; Hypotension; Myocardial Infarction; Peripheral Arterial Disease; Peripheral Venous Disease; Phlebitis; Vasculitis Genitourinary Complaints and Symptoms: No Complaints or Symptoms Complaints and Symptoms: Negative for: Kidney failure/ Dialysis; Incontinence/dribbling Medical History: Negative for: End Stage Renal Disease Immunological Complaints and Symptoms: Negative for: Hives; Itching [name], [name] ([id number]) Medical History: Negative for: Lupus Erythematosus; Raynaudos; Scleroderma Integumentary (Skin) Complaints and Symptoms: No Complaints or Symptoms Complaints and Symptoms: Negative for: Wounds; Bleeding or bruising tendency; Breakdown; Swelling Medical History: Negative for: History of Burn; History of pressure wounds Musculoskeletal Complaints and  Symptoms: Negative for: Muscle Pain; Muscle Weakness Medical History: Negative for: Gout; Rheumatoid Arthritis; Osteoarthritis; Osteomyelitis Neurologic Complaints and Symptoms: No Complaints or Symptoms Complaints and Symptoms: Negative for: Numbness/parasthesias; Focal/Weakness Medical History: Positive for: Dementia Negative for: Neuropathy; Quadriplegia; Paraplegia; Seizure Disorder Past Medical History Notes: parkinsons, tremor Psychiatric Complaints and Symptoms: No Complaints or Symptoms Complaints and Symptoms: Negative for: Anxiety; Claustrophobia Medical History: Negative for: Anorexia/bulimia; Confinement Anxiety Constitutional Symptoms (General Health) Complaints and Symptoms: No Complaints or Symptoms [name], SHOLTZ. ([id number]) Eyes Complaints and Symptoms: No Complaints or Symptoms Medical History: Negative for: Cataracts; Glaucoma; Optic Neuritis Ear/Nose/Mouth/Throat Complaints and Symptoms: No Complaints or Symptoms Medical History: Negative for: Chronic sinus problems/congestion; Middle ear problems Past Medical History Notes: dysphagia Hematologic/Lymphatic Complaints and Symptoms: No Complaints or Symptoms Medical History: Positive for: Anemia Negative for: Hemophilia; Human Immunodeficiency Virus; Lymphedema; Sickle Cell Disease Respiratory Complaints and Symptoms: No Complaints or Symptoms Medical History: Negative for: Aspiration; Asthma; Chronic Obstructive Pulmonary Disease (COPD); Pneumothorax; Sleep Apnea; Tuberculosis Gastrointestinal Medical History: Negative for: Cirrhosis ; Colitis; Crohnos; Hepatitis A; Hepatitis B; Hepatitis C Endocrine Complaints and Symptoms: No Complaints or Symptoms Medical History: Negative for: Type I Diabetes; Type II Diabetes Oncologic Complaints and Symptoms: No Complaints or Symptoms [name], [name]. ([id number]) Medical History: Negative for: Received Chemotherapy; Received  Radiation Immunizations Pneumococcal Vaccine: Received Pneumococcal Vaccination: Yes Immunization Notes: up to date Family and Social History Never smoker; Marital Status - Married; Alcohol Use: Never; Drug Use: No History; Caffeine Use: Never; Financial Concerns: No; Food, Clothing or Shelter Needs: No; Support System Lacking: No; Transportation Concerns: No; Advanced Directives: Yes; Medical Power of Attorney: Yes - dtr Electronic Signature(s) Signed: 01/07/2017 5:10:19 PM By: Curtis Sites Signed: 01/07/2017 5:13:56 PM By: Baltazar Najjar MD Entered By: Curtis Sites on 01/07/2017 09:12:25 [name] ([id number]) -------------------------------------------------------------------------------- SuperBill Details Patient Name: [name] Date of Service: 01/07/2017 Medical Record Patient Account Number: [id number] Number: Treating RN: Curtis Sites 01/07/1923 (83 y.o. Other Clinician: Date of Birth/Sex: Female) Treating Emmaus Brandi Primary Care Provider: Vonita Moss Provider/Extender: G Referring Provider: Vonita Moss  Weeks in Treatment: 0 Diagnosis Coding ICD-10 Codes Code Description L89.150 Pressure ulcer of sacral region, unstageable G20 Parkinson's disease Facility Procedures CPT4 Code: 16109604 Description: 99213 - WOUND CARE VISIT-LEV 3 EST PT Modifier: Quantity: 1 CPT4 Code: 54098119 Description: 11043 - DEB MUSC/FASCIA 20 SQ CM/< ICD-10 Description Diagnosis L89.150 Pressure ulcer of sacral region, unstageable Modifier: Quantity: 1 Physician Procedures CPT4 Code: 1478295 Description: 99204 - WC PHYS LEVEL 4 - NEW PT ICD-10 Description Diagnosis L89.150 Pressure ulcer of sacral region, unstageable G20 Parkinson's disease Modifier: 25 Quantity: 1 CPT4 Code: 6213086 Description: 11043 - WC PHYS DEBR MUSCLE/FASCIA 20 SQ CM ICD-10 Description Diagnosis L89.150 Pressure ulcer of sacral region, unstageable Modifier: Quantity:  1 Electronic Signature(s) Signed: 01/07/2017 5:13:56 PM By: Baltazar Najjar MD Entered By: Baltazar Najjar on 01/07/2017 12:07:29

## 2017-01-09 NOTE — Progress Notes (Signed)
[name], [name] ([id number]) Visit Report for 01/07/2017 Allergy List Details Patient Name: [name] Date of Service: 01/07/2017 8:45 AM Medical Record Patient Account Number: [id number] Number: Treating RN: [name] 03-11-1923 (83 y.o. Other Clinician: Date of Birth/Sex: Female) Treating [name] Primary Care [name]: [name] [name] [name] [name]: [name] Weeks in Treatment: 0 Allergies Active Allergies Phenergan Sulfa (Sulfonamide Antibiotics) Allergy Notes Electronic Signature(s) Signed: 01/07/2017 5:10:19 PM By: [name] Entered By: [name] on 01/07/2017 09:15:26 [name] ([id number]) -------------------------------------------------------------------------------- Arrival Information Details Patient Name: [name] Date of Service: 01/07/2017 8:45 AM Medical Record Patient Account Number: [id number] Number: Treating RN: [name] 04-Mar-1923 (83 y.o. Other Clinician: Date of Birth/Sex: Female) Treating [name] Primary Care Jozelynn Danielson: [name] Dmetrius Ambs/Extender: [name] Ervine Witucki: [name] Weeks in Treatment: 0 Visit Information Patient Arrived: Wheel Chair Arrival Time: 09:02 Accompanied By: [name] Transfer Assistance: Manual Patient Identification Verified: Yes Secondary Verification Process Yes Completed: Electronic Signature(s) Signed: 01/07/2017 5:10:19 PM By: [name] Entered By: [name] on 01/07/2017 09:02:32 [name] ([id number]) -------------------------------------------------------------------------------- Clinic Level of Care Assessment Details Patient Name: [name] Date of Service: 01/07/2017 8:45 AM Medical Record Patient Account Number: [id number] Number: Treating RN: [name] 14-May-1923 (83 y.o. Other Clinician: Date of Birth/Sex: Female) Treating [name] Primary  Care Kathreen Dileo: [name] Cleavon Goldman/Extender: [name] Rice Walsh: [name] Weeks in Treatment: 0 Clinic Level of Care Assessment Items TOOL 1 Quantity Score  - Use when EandM and Procedure is performed on INITIAL visit 0 ASSESSMENTS - Nursing Assessment / Reassessment X - General Physical Exam (combine w/ comprehensive assessment (listed just 1 20 below) when performed on new pt. evals) X - Comprehensive Assessment (HX, ROS, Risk Assessments, Wounds Hx, etc.) 1 25 ASSESSMENTS - Wound and Skin Assessment / Reassessment  - Dermatologic / Skin Assessment (not related to wound area) 0 ASSESSMENTS - Ostomy and/or Continence Assessment and Care  - Incontinence Assessment and Management 0  - Ostomy Care Assessment and Management (repouching, etc.) 0 PROCESS - Coordination of Care X - Simple Patient / Family Education for ongoing care 1 15  - Complex (extensive) Patient / Family Education for ongoing care 0 X - Staff obtains Chiropractor, Records, Test Results / Process Orders 1 10  - Staff telephones HHA, Nursing Homes / Clarify orders / etc 0  - Routine Transfer to another Facility (non-emergent condition) 0  - Routine Hospital Admission (non-emergent condition) 0 X - New Admissions / Manufacturing engineer / Ordering NPWT, Apligraf, etc. 1 15  - Emergency Hospital Admission (emergent condition) 0 PROCESS - Special Needs  - Pediatric / Minor Patient Management 0 [name], [name]. ([id number])  - Isolation Patient Management 0  - Hearing / Language / Visual special needs 0  - Assessment of Community assistance (transportation, D/C planning, etc.) 0 X - Additional assistance / Altered mentation 1 15 X - Support Surface(s) Assessment (bed, cushion, seat, etc.) 1 15 INTERVENTIONS - Miscellaneous  - External ear exam 0  - Patient Transfer (multiple staff / Nurse, adult / Similar devices) 0  - Simple Staple / Suture removal (25 or less) 0  - Complex  Staple / Suture removal (26 or more) 0  - Hypo/Hyperglycemic Management (do not check if billed separately) 0  - Ankle / Brachial Index (ABI) - do not check if billed separately 0 Has the patient been seen at the hospital within the last three years: Yes Total Score: 115 Level  Of Care: New/Established - Level 3 Electronic Signature(s) Signed: 01/07/2017 5:10:19 PM By: [name] Entered By: [name] on 01/07/2017 10:00:05 [name] ([id number]) -------------------------------------------------------------------------------- Encounter Discharge Information Details Patient Name: [name] Date of Service: 01/07/2017 8:45 AM Medical Record Patient Account Number: [id number] Number: Treating RN: [name] 09-Oct-1922 (83 y.o. Other Clinician: Date of Birth/Sex: Female) Treating [name] Primary Care [name]: [name] [name]/Extender: [name] [name]: [name] Weeks in Treatment: 0 Encounter Discharge Information Items Discharge Pain Level: 0 Discharge Condition: Stable Ambulatory Status: Wheelchair Discharge Destination: Home Transportation: Private Auto [name] and Accompanied By: caregiver Schedule Follow-up Appointment: Yes Medication Reconciliation completed and provided to Patient/Care No [name]: Provided on Clinical Summary of Care: 01/07/2017 Form Type Recipient Paper Patient ES Electronic Signature(s) Signed: 01/07/2017 10:15:14 AM By: [name] Entered By: [name] on 01/07/2017 10:15:14 [name] ([id number]) -------------------------------------------------------------------------------- Multi Wound Chart Details Patient Name: [name] Date of Service: 01/07/2017 8:45 AM Medical Record Patient Account Number: [id number] Number: Treating RN: [name] [birth year] (83 y.o. Other Clinician: Date of Birth/Sex: Female) Treating [name] Primary Care  Orlondo Holycross: [name] Caytlin Better/Extender: [name] Riyaan Heroux: [name] Weeks in Treatment: 0 Vital Signs Height(in): Pulse(bpm): 75 Weight(lbs): Blood Pressure 154/91 (mmHg): Body Mass Index(BMI): Temperature(F): 98.8 Respiratory Rate 18 (breaths/min): Photos: [1:No Photos] [N/A:N/A] Wound Location: [1:Coccyx - Midline] [N/A:N/A] Wounding Event: [1:Pressure Injury] [N/A:N/A] Primary Etiology: [1:Pressure Ulcer] [N/A:N/A] Comorbid History: [1:Anemia, Hypertension, Dementia] [N/A:N/A] Date Acquired: [1:12/22/2016] [N/A:N/A] Weeks of Treatment: [1:0] [N/A:N/A] Wound Status: [1:Open] [N/A:N/A] Measurements L x W x D 2.3x2.3x0.4 [N/A:N/A] (cm) Area (cm) : [1:4.155] [N/A:N/A] Volume (cm) : [1:1.662] [N/A:N/A] Classification: [1:Unstageable/Unclassified] [N/A:N/A] Exudate Amount: [1:Large] [N/A:N/A] Exudate Type: [1:Serous] [N/A:N/A] Exudate Color: [1:amber] [N/A:N/A] Wound Margin: [1:Flat and Intact] [N/A:N/A] Granulation Amount: [1:None Present (0%)] [N/A:N/A] Necrotic Amount: [1:Large (67-100%)] [N/A:N/A] Necrotic Tissue: [1:Eschar, Adherent Slough] [N/A:N/A] Exposed Structures: [1:Fascia: No Fat Layer (Subcutaneous Tissue) Exposed: No Tendon: No Muscle: No Joint: No Bone: No] [N/A:N/A] Limited to Skin Breakdown Epithelialization: None N/A N/A Debridement: Debridement (78295- N/A N/A 11047) Pre-procedure 09:50 N/A N/A Verification/Time Out Taken: Pain Control: Lidocaine 4% Topical N/A N/A Solution Tissue Debrided: Necrotic/Eschar, N/A N/A Fibrin/Slough, Muscle, Subcutaneous Level: Skin/Subcutaneous N/A N/A Tissue/Muscle Debridement Area (sq 5.29 N/A N/A cm): Instrument: Curette, Forceps N/A N/A Bleeding: Moderate N/A N/A Hemostasis Achieved: Pressure N/A N/A Procedural Pain: 0 N/A N/A Post Procedural Pain: 0 N/A N/A Debridement Treatment Procedure was tolerated N/A N/A Response: well Post Debridement 2.3x2.3x0.6 N/A N/A Measurements L x W x  D (cm) Post Debridement 2.493 N/A N/A Volume: (cm) Post Debridement Unstageable/Unclassified N/A N/A Stage: Periwound Skin Texture: Excoriation: No N/A N/A Induration: No Callus: No Crepitus: No Rash: No Scarring: No Periwound Skin Maceration: No N/A N/A Moisture: Dry/Scaly: No Periwound Skin Color: Erythema: Yes N/A N/A Atrophie Blanche: No Cyanosis: No Ecchymosis: No Hemosiderin Staining: No Mottled: No Pallor: No Rubor: No Erythema Location: Circumferential N/A N/A Tenderness on Yes N/A N/A Palpation: [name], [name] ([id number]) Wound Preparation: Ulcer Cleansing: N/A N/A Rinsed/Irrigated with Saline Topical Anesthetic Applied: Other: lidocaine 4% Procedures Performed: Debridement N/A N/A Treatment Notes Wound #1 (Midline Coccyx) 1. Cleansed with: Clean wound with Normal Saline 2. Anesthetic Topical Lidocaine 4% cream to wound bed prior to debridement 3. Peri-wound Care: Skin Prep 4. Dressing Applied: [name] Ointment 5. Secondary Dressing Applied Bordered Foam Dressing Dry Gauze Electronic Signature(s) Signed: 01/07/2017 5:13:56 PM By: Baltazar Najjar MD Entered By: Baltazar Najjar on 01/07/2017 10:17:31 [name], Tina Gip. (  [id number]) -------------------------------------------------------------------------------- Multi-Disciplinary Care Plan Details Patient Name: [name], [name] Date of Service: 01/07/2017 8:45 AM Medical Record Patient Account Number: [id number] Number: Treating RN: [name] 10/21/1922 (83 y.o. Other Clinician: Date of Birth/Sex: Female) Treating [name] Primary Care Zalika Tieszen: [name] Hennessey Cantrell/Extender: [name] Albion Weatherholtz: [name] Weeks in Treatment: 0 Active Inactive ` Abuse / Safety / Falls / Self Care Management Nursing Diagnoses: Impaired physical mobility Potential for falls Goals: Patient will remain injury free Date Initiated: 01/07/2017 Target Resolution Date:  04/03/2017 Goal Status: Active Interventions: Assess fall risk on admission and as needed Notes: ` Nutrition Nursing Diagnoses: Potential for alteratiion in Nutrition/Potential for imbalanced nutrition Goals: Patient/caregiver agrees to and verbalizes understanding of need to use nutritional supplements and/or vitamins as prescribed Date Initiated: 01/07/2017 Target Resolution Date: 04/03/2017 Goal Status: Active Interventions: Assess patient nutrition upon admission and as needed per policy Notes: ` Orientation to the Wound Care Program [name], [name] ([id number]) Nursing Diagnoses: Knowledge deficit related to the wound healing center program Goals: Patient/caregiver will verbalize understanding of the Wound Healing Center Program Date Initiated: 01/07/2017 Target Resolution Date: 04/03/2017 Goal Status: Active Interventions: Provide education on orientation to the wound center Notes: ` Pressure Nursing Diagnoses: Knowledge deficit related to causes and risk factors for pressure ulcer development Goals: Patient will remain free from development of additional pressure ulcers Date Initiated: 01/07/2017 Target Resolution Date: 04/03/2017 Goal Status: Active Interventions: Assess potential for pressure ulcer upon admission and as needed Notes: ` Wound/Skin Impairment Nursing Diagnoses: Impaired tissue integrity Goals: Patient/caregiver will verbalize understanding of skin care regimen Date Initiated: 01/07/2017 Target Resolution Date: 04/03/2017 Goal Status: Active Ulcer/skin breakdown will have a volume reduction of 30% by week 4 Date Initiated: 01/07/2017 Target Resolution Date: 04/03/2017 Goal Status: Active Ulcer/skin breakdown will have a volume reduction of 50% by week 8 Date Initiated: 01/07/2017 Target Resolution Date: 04/03/2017 Goal Status: Active Ulcer/skin breakdown will have a volume reduction of 80% by week 12 [name], [name] ([id number]) Date Initiated:  01/07/2017 Target Resolution Date: 04/03/2017 Goal Status: Active Ulcer/skin breakdown will heal within 14 weeks Date Initiated: 01/07/2017 Target Resolution Date: 04/03/2017 Goal Status: Active Interventions: Assess patient/caregiver ability to obtain necessary supplies Assess patient/caregiver ability to perform ulcer/skin care regimen upon admission and as needed Assess ulceration(s) every visit Notes: Electronic Signature(s) Signed: 01/07/2017 5:10:19 PM By: [name] Entered By: [name] on 01/07/2017 09:44:16 [name] ([id number]) -------------------------------------------------------------------------------- Pain Assessment Details Patient Name: [name] Date of Service: 01/07/2017 8:45 AM Medical Record Patient Account Number: [id number] Number: Treating RN: [name] September 05, 1923 (83 y.o. Other Clinician: Date of Birth/Sex: Female) Treating [name] Primary Care Elgin Carn: [name] Cindie Rajagopalan/Extender: [name] Rigo Letts: [name] Weeks in Treatment: 0 Active Problems Location of Pain Severity and Description of Pain Patient Has Paino Patient Unable to Respond Site Locations Pain Management and Medication Current Pain Management: Electronic Signature(s) Signed: 01/07/2017 5:10:19 PM By: [name] Entered By: [name] on 01/07/2017 09:02:41 [name] ([id number]) -------------------------------------------------------------------------------- Patient/Caregiver Education Details Patient Name: [name] Date of Service: 01/07/2017 8:45 AM Medical Record Patient Account Number: [id number] Number: Treating RN: [name] [birth year] (83 y.o. Other Clinician: Date of Birth/Gender: Female) Treating [name] Primary Care Physician: [name] Physician/Extender: [name] Physician: [name] Weeks in Treatment: 0 Education Assessment Education Provided  To: Caregiver Education Topics Provided Pressure: Handouts: Preventing Pressure Ulcers Methods: Explain/Verbal Responses: State content correctly Wound/Skin Impairment: Handouts: Other: wound care as ordered  Methods: Demonstration, Explain/Verbal Responses: State content correctly Electronic Signature(s) Signed: 01/07/2017 5:10:19 PM By: [name] Entered By: [name] on 01/07/2017 09:45:32 [name] ([id number]) -------------------------------------------------------------------------------- Wound Assessment Details Patient Name: [name] Date of Service: 01/07/2017 8:45 AM Medical Record Patient Account Number: [id number] Number: Treating RN: [name] October 09, 1922 (83 y.o. Other Clinician: Date of Birth/Sex: Female) Treating [name] Primary Care Paizlee Kinder: [name] Dorraine Ellender/Extender: [name] Guido Comp: [name] Weeks in Treatment: 0 Wound Status Wound Number: 1 Primary Etiology: Pressure Ulcer Wound Location: Coccyx - Midline Wound Status: Open Wounding Event: Pressure Injury Comorbid History: Anemia, Hypertension, Dementia Date Acquired: 12/22/2016 Weeks Of Treatment: 0 Clustered Wound: No Photos Photo Uploaded By: [name] on 01/07/2017 11:56:36 Wound Measurements Length: (cm) 2.3 Width: (cm) 2.3 Depth: (cm) 0.4 Area: (cm) 4.155 Volume: (cm) 1.662 % Reduction in Area: % Reduction in Volume: Epithelialization: None Tunneling: No Undermining: No Wound Description Classification: Unstageable/Unclassified Wound Margin: Flat and Intact Exudate Amount: Large Exudate Type: Serous Exudate Color: amber Foul Odor After Cleansing: No Slough/Fibrino Yes Wound Bed Granulation Amount: None Present (0%) Exposed Structure Necrotic Amount: Large (67-100%) Fascia Exposed: No Necrotic Quality: Eschar, Adherent Slough Fat Layer (Subcutaneous Tissue) Exposed: No [name], BENDALL. ([id number]) Tendon  Exposed: No Muscle Exposed: No Joint Exposed: No Bone Exposed: No Limited to Skin Breakdown Periwound Skin Texture Texture Color No Abnormalities Noted: No No Abnormalities Noted: No Callus: No Atrophie Blanche: No Crepitus: No Cyanosis: No Excoriation: No Ecchymosis: No Induration: No Erythema: Yes Rash: No Erythema Location: Circumferential Scarring: No Hemosiderin Staining: No Mottled: No Moisture Pallor: No No Abnormalities Noted: No Rubor: No Dry / Scaly: No Maceration: No Temperature / Pain Tenderness on Palpation: Yes Wound Preparation Ulcer Cleansing: Rinsed/Irrigated with Saline Topical Anesthetic Applied: Other: lidocaine 4%, Electronic Signature(s) Signed: 01/07/2017 5:10:19 PM By: [name] Entered By: [name] on 01/07/2017 09:25:07 [name] ([id number]) -------------------------------------------------------------------------------- Vitals Details Patient Name: [name] Date of Service: 01/07/2017 8:45 AM Medical Record Patient Account Number: [id number] Number: Treating RN: [name] Nov 28, 1922 (83 y.o. Other Clinician: Date of Birth/Sex: Female) Treating [name] Primary Care Hartwell Vandiver: [name] Corey Laski/Extender: [name] Kazuko Clemence: [name] Weeks in Treatment: 0 Vital Signs Time Taken: 09:02 Temperature (F): 98.8 Pulse (bpm): 75 Respiratory Rate (breaths/min): 18 Blood Pressure (mmHg): 154/91 Reference Range: 80 - 120 mg / dl Electronic Signature(s) Signed: 01/07/2017 5:10:19 PM By: [name] Entered By: [name] on 01/07/2017 09:06:30

## 2017-01-13 ENCOUNTER — Encounter: Payer: Medicare Other | Admitting: Internal Medicine

## 2017-01-13 DIAGNOSIS — L89150 Pressure ulcer of sacral region, unstageable: Secondary | ICD-10-CM | POA: Diagnosis not present

## 2017-01-14 ENCOUNTER — Ambulatory Visit: Payer: Medicare Other | Admitting: Family Medicine

## 2017-01-15 NOTE — Progress Notes (Signed)
JANAIYA, BEAUCHESNE ([id number]) Visit Report for 01/13/2017 Arrival Information Details Patient Name: TEXIE, TUPOU Date of Service: 01/13/2017 1:30 PM Medical Record Patient Account Number: [id number] Number: Treating RN: Huel Coventry 1922-11-02 (83 y.o. Other Clinician: Date of Birth/Sex: Female) Treating ROBSON, MICHAEL Primary Care Trinidi Toppins: Vonita Moss Katelyn Broadnax/Extender: G Referring Jamise Pentland: Vonita Moss Weeks in Treatment: 0 Visit Information History Since Last Visit Added or deleted any medications: No Patient Arrived: Wheel Chair Any new allergies or adverse No Arrival Time: 13:48 reactions: Accompanied By: daughter Had a fall or experienced change in No Transfer Assistance: EasyPivot Patient activities of daily living that may Lift affect Patient Identification Verified: Yes risk of falls: Secondary Verification Process Yes Signs or symptoms of abuse/neglect No Completed: since last visito Hospitalized since last visit: No Has Dressing in Place as Yes Prescribed: Pain Present Now: Unable to Respond Electronic Signature(s) Signed: 01/13/2017 5:07:37 PM By: Elliot Gurney, RN, BSN, Kim RN, BSN Entered By: Elliot Gurney, RN, BSN, Kim on 01/13/2017 13:48:52 [name] ([id number]) -------------------------------------------------------------------------------- Encounter Discharge Information Details Patient Name: [name] Date of Service: 01/13/2017 1:30 PM Medical Record Patient Account Number: [id number] Number: Treating RN: Huel Coventry 1923/05/02 (83 y.o. Other Clinician: Date of Birth/Sex: Female) Treating ROBSON, MICHAEL Primary Care Benen Weida: Vonita Moss Jorden Minchey/Extender: G Referring Zuha Dejonge: Vonita Moss Weeks in Treatment: 0 Encounter Discharge Information Items Discharge Pain Level: 0 Discharge Condition: Stable Ambulatory Status: Wheelchair Discharge Destination: Home Private Transportation: Auto Accompanied By:  daughters Schedule Follow-up Appointment: Yes Medication Reconciliation completed and Yes provided to Patient/Care Evadene Wardrip: Clinical Summary of Care: Electronic Signature(s) Signed: 01/13/2017 5:07:37 PM By: Elliot Gurney, RN, BSN, Kim RN, BSN Previous Signature: 01/13/2017 2:32:09 PM Version By: Gwenlyn Perking Entered By: Elliot Gurney RN, BSN, Kim on 01/13/2017 14:32:26 [name] ([id number]) -------------------------------------------------------------------------------- Lower Extremity Assessment Details Patient Name: [name] Date of Service: 01/13/2017 1:30 PM Medical Record Patient Account Number: [id number] Number: Treating RN: Huel Coventry [birth year] (83 y.o. Other Clinician: Date of Birth/Sex: Female) Treating ROBSON, MICHAEL Primary Care Urho Rio: Vonita Moss Aaryn Sermon/Extender: G Referring Precilla Purnell: Vonita Moss Weeks in Treatment: 0 Electronic Signature(s) Signed: 01/13/2017 5:07:37 PM By: Elliot Gurney, RN, BSN, Kim RN, BSN Entered By: Elliot Gurney, RN, BSN, Kim on 01/13/2017 14:28:10 [name] ([id number]) -------------------------------------------------------------------------------- Multi Wound Chart Details Patient Name: [name] Date of Service: 01/13/2017 1:30 PM Medical Record Patient Account Number: [id number] Number: Treating RN: Huel Coventry 1923-08-29 (83 y.o. Other Clinician: Date of Birth/Sex: Female) Treating ROBSON, MICHAEL Primary Care Alysa Duca: Vonita Moss Carrisa Keller/Extender: G Referring Firman Petrow: Vonita Moss Weeks in Treatment: 0 Vital Signs Height(in): Pulse(bpm): 52 Weight(lbs): Blood Pressure 121/96 (mmHg): Body Mass Index(BMI): Temperature(F): 98.1 Respiratory Rate 16 (breaths/min): Photos: [N/A:N/A] Wound Location: Coccyx - Midline N/A N/A Wounding Event: Pressure Injury N/A N/A Primary Etiology: Pressure Ulcer N/A N/A Comorbid History: Anemia, Hypertension, N/A N/A Dementia Date Acquired: 12/01/2016  N/A N/A Weeks of Treatment: 0 N/A N/A Wound Status: Open N/A N/A Measurements L x W x D 2.5x2.5x0.5 N/A N/A (cm) Area (cm) : 4.909 N/A N/A Volume (cm) : 2.454 N/A N/A % Reduction in Area: -18.10% N/A N/A % Reduction in Volume: -47.70% N/A N/A Classification: Unstageable/Unclassified N/A N/A Exudate Amount: Large N/A N/A Exudate Type: Serous N/A N/A Exudate Color: amber N/A N/A Wound Margin: Flat and Intact N/A N/A Granulation Amount: None Present (0%) N/A N/A Necrotic Amount: Large (67-100%) N/A N/A Necrotic Tissue: Eschar, Adherent Slough N/A N/A NOGA, FOGG ([id number]) Exposed Structures: Fascia: No N/A N/A Fat Layer (  Subcutaneous Tissue) Exposed: No Tendon: No Muscle: No Joint: No Bone: No Limited to Skin Breakdown Epithelialization: None N/A N/A Debridement: Debridement (16109- N/A N/A 11047) Pre-procedure 14:25 N/A N/A Verification/Time Out Taken: Pain Control: Other N/A N/A Tissue Debrided: Fibrin/Slough, Fat, N/A N/A Subcutaneous Level: Skin/Subcutaneous N/A N/A Tissue Debridement Area (sq 6.25 N/A N/A cm): Instrument: Blade, Curette, Forceps N/A N/A Bleeding: Minimum N/A N/A Hemostasis Achieved: Pressure N/A N/A Procedural Pain: 0 N/A N/A Post Procedural Pain: 0 N/A N/A Debridement Treatment Procedure was tolerated N/A N/A Response: well Post Debridement 2.56x2.5x0.7 N/A N/A Measurements L x W x D (cm) Post Debridement 3.519 N/A N/A Volume: (cm) Post Debridement Category/Stage III N/A N/A Stage: Periwound Skin Texture: Excoriation: No N/A N/A Induration: No Callus: No Crepitus: No Rash: No Scarring: No Periwound Skin Maceration: No N/A N/A Moisture: Dry/Scaly: No Periwound Skin Color: Erythema: Yes N/A N/A Atrophie Blanche: No Cyanosis: No Ecchymosis: No Hemosiderin Staining: No Mottled: No MERL, GUARDINO. ([id number]) Pallor: No Rubor: No Erythema Location: Circumferential N/A N/A Tenderness on Yes N/A  N/A Palpation: Wound Preparation: Ulcer Cleansing: N/A N/A Rinsed/Irrigated with Saline Topical Anesthetic Applied: Other: lidocaine 4% Procedures Performed: Debridement N/A N/A Treatment Notes Wound #1 (Midline Coccyx) 1. Cleansed with: Clean wound with Normal Saline 2. Anesthetic Topical Lidocaine 4% cream to wound bed prior to debridement 4. Dressing Applied: [name] Ointment 5. Secondary Dressing Applied Bordered Foam Dressing Electronic Signature(s) Signed: 01/14/2017 7:57:33 AM By: Baltazar Najjar MD Entered By: Baltazar Najjar on 01/13/2017 15:31:57 [name] ([id number]) -------------------------------------------------------------------------------- Multi-Disciplinary Care Plan Details Patient Name: [name] Date of Service: 01/13/2017 1:30 PM Medical Record Patient Account Number: [id number] Number: Treating RN: Huel Coventry Apr 18, 1923 (83 y.o. Other Clinician: Date of Birth/Sex: Female) Treating ROBSON, MICHAEL Primary Care Aneesh Faller: Vonita Moss Millenia Waldvogel/Extender: G Referring Daniel Ritthaler: Vonita Moss Weeks in Treatment: 0 Active Inactive ` Abuse / Safety / Falls / Self Care Management Nursing Diagnoses: Impaired physical mobility Potential for falls Goals: Patient will remain injury free Date Initiated: 01/07/2017 Target Resolution Date: 04/03/2017 Goal Status: Active Interventions: Assess fall risk on admission and as needed Notes: ` Nutrition Nursing Diagnoses: Potential for alteratiion in Nutrition/Potential for imbalanced nutrition Goals: Patient/caregiver agrees to and verbalizes understanding of need to use nutritional supplements and/or vitamins as prescribed Date Initiated: 01/07/2017 Target Resolution Date: 04/03/2017 Goal Status: Active Interventions: Assess patient nutrition upon admission and as needed per policy Notes: ` Orientation to the Wound Care Program KORDELIA, SEVERIN ([id number]) Nursing  Diagnoses: Knowledge deficit related to the wound healing center program Goals: Patient/caregiver will verbalize understanding of the Wound Healing Center Program Date Initiated: 01/07/2017 Target Resolution Date: 04/03/2017 Goal Status: Active Interventions: Provide education on orientation to the wound center Notes: ` Pressure Nursing Diagnoses: Knowledge deficit related to causes and risk factors for pressure ulcer development Goals: Patient will remain free from development of additional pressure ulcers Date Initiated: 01/07/2017 Target Resolution Date: 04/03/2017 Goal Status: Active Interventions: Assess potential for pressure ulcer upon admission and as needed Notes: ` Wound/Skin Impairment Nursing Diagnoses: Impaired tissue integrity Goals: Patient/caregiver will verbalize understanding of skin care regimen Date Initiated: 01/07/2017 Target Resolution Date: 04/03/2017 Goal Status: Active Ulcer/skin breakdown will have a volume reduction of 30% by week 4 Date Initiated: 01/07/2017 Target Resolution Date: 04/03/2017 Goal Status: Active Ulcer/skin breakdown will have a volume reduction of 50% by week 8 Date Initiated: 01/07/2017 Target Resolution Date: 04/03/2017 Goal Status: Active Ulcer/skin breakdown will have a volume reduction of 80% by week 12  SONG, MYRE ([id number]) Date Initiated: 01/07/2017 Target Resolution Date: 04/03/2017 Goal Status: Active Ulcer/skin breakdown will heal within 14 weeks Date Initiated: 01/07/2017 Target Resolution Date: 04/03/2017 Goal Status: Active Interventions: Assess patient/caregiver ability to obtain necessary supplies Assess patient/caregiver ability to perform ulcer/skin care regimen upon admission and as needed Assess ulceration(s) every visit Notes: Electronic Signature(s) Signed: 01/13/2017 5:07:37 PM By: Elliot Gurney, RN, BSN, Kim RN, BSN Entered By: Elliot Gurney, RN, BSN, Kim on 01/13/2017 14:28:25 [name]  ([id number]) -------------------------------------------------------------------------------- Pain Assessment Details Patient Name: [name] Date of Service: 01/13/2017 1:30 PM Medical Record Patient Account Number: [id number] Number: Treating RN: Huel Coventry 07/16/1923 (83 y.o. Other Clinician: Date of Birth/Sex: Female) Treating ROBSON, MICHAEL Primary Care Charlaine Utsey: Vonita Moss Raul Torrance/Extender: G Referring Orlanda Lemmerman: Vonita Moss Weeks in Treatment: 0 Active Problems Location of Pain Severity and Description of Pain Patient Has Paino Patient Unable to Respond Site Locations Pain Management and Medication Current Pain Management: Electronic Signature(s) Signed: 01/13/2017 5:07:37 PM By: Elliot Gurney, RN, BSN, Kim RN, BSN Entered By: Elliot Gurney, RN, BSN, Kim on 01/13/2017 13:49:02 [name] ([id number]) -------------------------------------------------------------------------------- Patient/Caregiver Education Details Patient Name: [name] Date of Service: 01/13/2017 1:30 PM Medical Record Patient Account Number: [id number] Number: Treating RN: Huel Coventry 1923/08/27 (83 y.o. Other Clinician: Date of Birth/Gender: Female) Treating ROBSON, MICHAEL Primary Care Physician: Vonita Moss Physician/Extender: G Referring Physician: Vonita Moss Weeks in Treatment: 0 Education Assessment Education Provided To: Patient Education Topics Provided Pressure: Handouts: Pressure Ulcers: Care and Offloading Methods: Demonstration, Explain/Verbal Responses: State content correctly Safety: Wound Debridement: Handouts: Wound Debridement Methods: Demonstration, Explain/Verbal Responses: State content correctly Electronic Signature(s) Signed: 01/13/2017 5:07:37 PM By: Elliot Gurney, RN, BSN, Kim RN, BSN Entered By: Elliot Gurney, RN, BSN, Kim on 01/13/2017 14:32:52 [name]  ([id number]) -------------------------------------------------------------------------------- Wound Assessment Details Patient Name: [name] Date of Service: 01/13/2017 1:30 PM Medical Record Patient Account Number: [id number] Number: Treating RN: Huel Coventry [birth year] (83 y.o. Other Clinician: Date of Birth/Sex: Female) Treating ROBSON, MICHAEL Primary Care Flordia Kassem: Vonita Moss Lorimer Tiberio/Extender: G Referring Darline Faith: Vonita Moss Weeks in Treatment: 0 Wound Status Wound Number: 1 Primary Etiology: Pressure Ulcer Wound Location: Coccyx - Midline Wound Status: Open Wounding Event: Pressure Injury Comorbid History: Anemia, Hypertension, Dementia Date Acquired: 12/01/2016 Weeks Of Treatment: 0 Clustered Wound: No Photos Photo Uploaded By: Elliot Gurney, RN, BSN, Kim on 01/13/2017 15:31:38 Wound Measurements Length: (cm) 2.5 Width: (cm) 2.5 Depth: (cm) 0.5 Area: (cm) 4.909 Volume: (cm) 2.454 % Reduction in Area: -18.1% % Reduction in Volume: -47.7% Epithelialization: None Tunneling: No Undermining: No Wound Description Classification: Unstageable/Unclassified Wound Margin: Flat and Intact Exudate Amount: Large Exudate Type: Serous Exudate Color: amber Foul Odor After Cleansing: No Slough/Fibrino Yes Wound Bed Granulation Amount: None Present (0%) Exposed Structure Necrotic Amount: Large (67-100%) Fascia Exposed: No Necrotic Quality: Eschar, Adherent Slough Fat Layer (Subcutaneous Tissue) Exposed: No Tendon Exposed: No Muscle Exposed: No CAROLLYNN, PENNYWELL. ([id number]) Joint Exposed: No Bone Exposed: No Limited to Skin Breakdown Periwound Skin Texture Texture Color No Abnormalities Noted: No No Abnormalities Noted: No Callus: No Atrophie Blanche: No Crepitus: No Cyanosis: No Excoriation: No Ecchymosis: No Induration: No Erythema: Yes Rash: No Erythema Location: Circumferential Scarring: No Hemosiderin Staining: No Mottled:  No Moisture Pallor: No No Abnormalities Noted: No Rubor: No Dry / Scaly: No Maceration: No Temperature / Pain Tenderness on Palpation: Yes Wound Preparation Ulcer Cleansing: Rinsed/Irrigated with Saline Topical Anesthetic Applied: Other: lidocaine 4%, Treatment Notes Wound #1 (Midline Coccyx) 1. Cleansed with:  Clean wound with Normal Saline 2. Anesthetic Topical Lidocaine 4% cream to wound bed prior to debridement 4. Dressing Applied: [name] Ointment 5. Secondary Dressing Applied Bordered Foam Dressing Electronic Signature(s) Signed: 01/13/2017 5:07:37 PM By: Elliot Gurney, RN, BSN, Kim RN, BSN Entered By: Elliot Gurney, RN, BSN, Kim on 01/13/2017 13:56:12 [name] ([id number]) -------------------------------------------------------------------------------- Vitals Details Patient Name: [name] Date of Service: 01/13/2017 1:30 PM Medical Record Patient Account Number: [id number] Number: Treating RN: Huel Coventry 04/22/1923 (83 y.o. Other Clinician: Date of Birth/Sex: Female) Treating ROBSON, MICHAEL Primary Care Kennethia Lynes: Vonita Moss Edahi Kroening/Extender: G Referring Lynnea Vandervoort: Vonita Moss Weeks in Treatment: 0 Vital Signs Time Taken: 13:49 Temperature (F): 98.1 Pulse (bpm): 52 Respiratory Rate (breaths/min): 16 Blood Pressure (mmHg): 121/96 Reference Range: 80 - 120 mg / dl Electronic Signature(s) Signed: 01/13/2017 5:07:37 PM By: Elliot Gurney, RN, BSN, Kim RN, BSN Entered By: Elliot Gurney, RN, BSN, Kim on 01/13/2017 13:49:19

## 2017-01-15 NOTE — Progress Notes (Signed)
MEGHNA, HAGMANN ([id number]) Visit Report for 01/13/2017 Chief Complaint Document Details Patient Name: CORAH, WILLEFORD Date of Service: 01/13/2017 1:30 PM Medical Record Patient Account Number: [id number] Number: Treating RN: Huel Coventry 03/27/1923 (83 y.o. Other Clinician: Date of Birth/Sex: Female) Treating Kailon Treese Primary Care Provider: Vonita Moss Provider/Extender: G Referring Provider: Vonita Moss Weeks in Treatment: 0 Information Obtained from: Patient Chief Complaint 01/07/17; patient is here for review of a sacral pressure ulcer Electronic Signature(s) Signed: 01/14/2017 7:57:33 AM By: Baltazar Najjar MD Entered By: Baltazar Najjar on 01/13/2017 15:32:25 [name] ([id number]) -------------------------------------------------------------------------------- Debridement Details Patient Name: [name] Date of Service: 01/13/2017 1:30 PM Medical Record Patient Account Number: [id number] Number: Treating RN: Huel Coventry 02/28/1923 (83 y.o. Other Clinician: Date of Birth/Sex: Female) Treating Biana Haggar Primary Care Provider: Vonita Moss Provider/Extender: G Referring Provider: Vonita Moss Weeks in Treatment: 0 Debridement Performed for Wound #1 Midline Coccyx Assessment: Performed By: Physician Maxwell Caul, MD Debridement: Debridement Pre-procedure Yes - 14:25 Verification/Time Out Taken: Start Time: 14:26 Pain Control: Other : lidocaine 4% Level: Skin/Subcutaneous Tissue Total Area Debrided (L x 2.5 (cm) x 2.5 (cm) = 6.25 (cm) W): Tissue and other Viable, Non-Viable, Fat, Fibrin/Slough, Subcutaneous material debrided: Instrument: Blade, Curette, Forceps Bleeding: Minimum Hemostasis Achieved: Pressure End Time: 14:32 Procedural Pain: 0 Post Procedural Pain: 0 Response to Treatment: Procedure was tolerated well Post Debridement Measurements of Total Wound Length: (cm) 2.56 Stage:  Category/Stage III Width: (cm) 2.5 Depth: (cm) 0.7 Volume: (cm) 3.519 Character of Wound/Ulcer Post Requires Further Debridement: Debridement Severity of Tissue Post Fat layer exposed Debridement: Post Procedure Diagnosis Same as Pre-procedure Electronic Signature(s) Signed: 01/13/2017 5:07:37 PM By: Elliot Gurney, RN, BSN, Kim RN, BSN [place], [place] ([id number]) Signed: 01/14/2017 7:57:33 AM By: Baltazar Najjar MD Entered By: Baltazar Najjar on 01/13/2017 15:32:09 [name] ([id number]) -------------------------------------------------------------------------------- HPI Details Patient Name: [name] Date of Service: 01/13/2017 1:30 PM Medical Record Patient Account Number: [id number] Number: Treating RN: Huel Coventry [birth year] (83 y.o. Other Clinician: Date of Birth/Sex: Female) Treating Jasin Brazel Primary Care Provider: Vonita Moss Provider/Extender: G Referring Provider: Vonita Moss Weeks in Treatment: 0 History of Present Illness HPI Description: 01/07/17 this is a 83 year old woman admitted to the clinic today for review of a pressure ulcer on her lower sacrum. She is referred from her primary physician's office after being seen on 3/22 with a 3 cm pressure area. Her daughter and caretaker accompanied her today state that the area first became obvious about a month ago and his since deteriorated. They have recently got [name] a home health involved and have been using [name] to the wound. They have ordered a pressure relief surface for her mattress. They are turning her religiously. They state that she eats well and they've been forcing fluids on her. She is on a multivitamin. Looking through [facility] point last albumin I see was 4.4 on 10/28. The patient has advanced parkinsonism which looks superficially like advanced Parkinson's disease although her daughter tells me she did not ever respond to Sinemet therefore this may have another  pathology with signs of parkinsonism. However I think this is largely a mute point currently. She also has dementia and is nonambulatory. Since this started they have been keeping her in bed and turning her religiously every 2 hours. She lives at home in [place] with her husband with 24/7 care giving 01/13/17 santyl change qd. Still will require further debridement. continue santyl. Electronic Signature(s) Signed: 01/14/2017  7:57:33 AM By: Baltazar Najjar MD Entered By: Baltazar Najjar on 01/13/2017 15:33:17 [name] ([id number]) -------------------------------------------------------------------------------- Physical Exam Details Patient Name: [name] Date of Service: 01/13/2017 1:30 PM Medical Record Patient Account Number: [id number] Number: Treating RN: Huel Coventry Sep 10, 1923 (83 y.o. Other Clinician: Date of Birth/Sex: Female) Treating Exilda Wilhite Primary Care Provider: Vonita Moss Provider/Extender: G Referring Provider: Vonita Moss Weeks in Treatment: 0 Constitutional Sitting or standing Blood Pressure is within target range for patient.. Pulse regular and within target range for patient.Marland Kitchen Respirations regular, non-labored and within target range.. Temperature is normal and within the target range for the patient.. frail patient no change. Notes Wound exam; debidement of the necrotic material over the surface of the wound using a #5 curette. Using picups and scalpel removed non vialbe skin and subcutaneous tissue form the circumference of the wound. Hemostasis with direct pressure Electronic Signature(s) Signed: 01/14/2017 7:57:33 AM By: Baltazar Najjar MD Entered By: Baltazar Najjar on 01/13/2017 15:35:19 [name] ([id number]) -------------------------------------------------------------------------------- Physician Orders Details Patient Name: [name] Date of Service: 01/13/2017 1:30 PM Medical Record Patient Account  Number: [id number] Number: Treating RN: Huel Coventry [birth year] (83 y.o. Other Clinician: Date of Birth/Sex: Female) Treating Caedence Snowden Primary Care Provider: Vonita Moss Provider/Extender: G Referring Provider: Vonita Moss Weeks in Treatment: 0 Verbal / Phone Orders: No Diagnosis Coding Wound Cleansing Wound #1 Midline Coccyx o Clean wound with Normal Saline. o May Shower, gently pat wound dry prior to applying new dressing. Anesthetic Wound #1 Midline Coccyx o Topical Lidocaine 4% cream applied to wound bed prior to debridement Skin Barriers/Peri-Wound Care Wound #1 Midline Coccyx o Skin Prep Primary Wound Dressing Wound #1 Midline Coccyx o [name] Ointment Secondary Dressing Wound #1 Midline Coccyx o Dry Gauze o Boardered Foam Dressing - silicon bordered dressing due to skin breakdown Dressing Change Frequency Wound #1 Midline Coccyx o Change dressing every day. Follow-up Appointments Wound #1 Midline Coccyx o Return Appointment in 1 week. Off-Loading Wound #1 Midline Coccyx SAYLOR, MURRY. ([id number]) o Roho cushion for wheelchair - [facility] please order rojo cushion or gel cushion for patients wheelchair - whichever she qualifies for o Turn and reposition every 2 hours Additional Orders / Instructions Wound #1 Midline Coccyx o Increase protein intake. - please add protein supplements to patients diet o Other: - please add vitamin A, vitamin C and zinc supplements to patients diet Home Health Wound #1 Midline Coccyx o Continue Home Health Visits o Home Health Nurse may visit PRN to address patientos wound care needs. o FACE TO FACE ENCOUNTER: MEDICARE and MEDICAID PATIENTS: I certify that this patient is under my care and that I had a face-to-face encounter that meets the physician face-to-face encounter requirements with this patient on this date. The encounter with the patient was in whole or in part for the  following MEDICAL CONDITION: (primary reason for Home Healthcare) MEDICAL NECESSITY: I certify, that based on my findings, NURSING services are a medically necessary home health service. HOME BOUND STATUS: I certify that my clinical findings support that this patient is homebound (i.e., Due to illness or injury, pt requires aid of supportive devices such as crutches, cane, wheelchairs, walkers, the use of special transportation or the assistance of another person to leave their place of residence. There is a normal inability to leave the home and doing so requires considerable and taxing effort. Other absences are for medical reasons / religious services and are infrequent or of short duration when  for other reasons). o If current dressing causes regression in wound condition, may D/C ordered dressing product/s and apply Normal Saline Moist Dressing daily until next Wound Healing Center / Other MD appointment. Notify Wound Healing Center of regression in wound condition at [phone]. o Please direct any NON-WOUND related issues/requests for orders to patient's Primary Care Physician Medications-please add to medication list. Wound #1 Midline Coccyx o [name] Enzymatic Ointment Electronic Signature(s) Signed: 01/13/2017 5:07:37 PM By: Elliot Gurney RN, BSN, Kim RN, BSN Signed: 01/14/2017 7:57:33 AM By: Baltazar Najjar MD Entered By: Elliot Gurney, RN, BSN, Kim on 01/13/2017 14:31:28 [name] ([id number]) -------------------------------------------------------------------------------- Problem List Details Patient Name: [name] Date of Service: 01/13/2017 1:30 PM Medical Record Patient Account Number: [id number] Number: Treating RN: Huel Coventry June 21, 1923 (83 y.o. Other Clinician: Date of Birth/Sex: Female) Treating Kathryn Cosby Primary Care Provider: Vonita Moss Provider/Extender: G Referring Provider: Vonita Moss Weeks in Treatment: 0 Active  Problems ICD-10 Encounter Code Description Active Date Diagnosis L89.150 Pressure ulcer of sacral region, unstageable 01/07/2017 Yes G20 Parkinson's disease 01/07/2017 Yes Inactive Problems Resolved Problems Electronic Signature(s) Signed: 01/14/2017 7:57:33 AM By: Baltazar Najjar MD Entered By: Baltazar Najjar on 01/13/2017 15:31:46 [name] ([id number]) -------------------------------------------------------------------------------- Progress Note Details Patient Name: [name] Date of Service: 01/13/2017 1:30 PM Medical Record Patient Account Number: [id number] Number: Treating RN: Huel Coventry 1923/02/28 (83 y.o. Other Clinician: Date of Birth/Sex: Female) Treating Rhyker Silversmith Primary Care Provider: Vonita Moss Provider/Extender: G Referring Provider: Vonita Moss Weeks in Treatment: 0 Subjective Chief Complaint Information obtained from Patient 01/07/17; patient is here for review of a sacral pressure ulcer History of Present Illness (HPI) 01/07/17 this is a 83 year old woman admitted to the clinic today for review of a pressure ulcer on her lower sacrum. She is referred from her primary physician's office after being seen on 3/22 with a 3 cm pressure area. Her daughter and caretaker accompanied her today state that the area first became obvious about a month ago and his since deteriorated. They have recently got [name] a home health involved and have been using [name] to the wound. They have ordered a pressure relief surface for her mattress. They are turning her religiously. They state that she eats well and they've been forcing fluids on her. She is on a multivitamin. Looking through [facility] point last albumin I see was 4.4 on 10/28. The patient has advanced parkinsonism which looks superficially like advanced Parkinson's disease although her daughter tells me she did not ever respond to Sinemet therefore this may have another pathology  with signs of parkinsonism. However I think this is largely a mute point currently. She also has dementia and is nonambulatory. Since this started they have been keeping her in bed and turning her religiously every 2 hours. She lives at home in [place] with her husband with 24/7 care giving 01/13/17 santyl change qd. Still will require further debridement. continue santyl. Objective Constitutional Sitting or standing Blood Pressure is within target range for patient.. Pulse regular and within target range for patient.Marland Kitchen Respirations regular, non-labored and within target range.. Temperature is normal and within the target range for the patient.. frail patient no change. Vitals Time Taken: 1:49 PM, Temperature: 98.1 F, Pulse: 52 bpm, Respiratory Rate: 16 breaths/min, Blood Pressure: 121/96 mmHg. KYARRA, VANCAMP ([id number]) General Notes: Wound exam; debidement of the necrotic material over the surface of the wound using a #5 curette. Using picups and scalpel removed non vialbe skin and subcutaneous tissue form  the circumference of the wound. Hemostasis with direct pressure Integumentary (Hair, Skin) Wound #1 status is Open. Original cause of wound was Pressure Injury. The wound is located on the Midline Coccyx. The wound measures 2.5cm length x 2.5cm width x 0.5cm depth; 4.909cm^2 area and 2.454cm^3 volume. The wound is limited to skin breakdown. There is no tunneling or undermining noted. There is a large amount of serous drainage noted. The wound margin is flat and intact. There is no granulation within the wound bed. There is a large (67-100%) amount of necrotic tissue within the wound bed including Eschar and Adherent Slough. The periwound skin appearance exhibited: Erythema. The periwound skin appearance did not exhibit: Callus, Crepitus, Excoriation, Induration, Rash, Scarring, Dry/Scaly, Maceration, Atrophie Blanche, Cyanosis, Ecchymosis, Hemosiderin Staining, Mottled,  Pallor, Rubor. The surrounding wound skin color is noted with erythema which is circumferential. The periwound has tenderness on palpation. Assessment Active Problems ICD-10 L89.150 - Pressure ulcer of sacral region, unstageable G20 - Parkinson's disease Procedures Wound #1 Wound #1 is a Pressure Ulcer located on the Midline Coccyx . There was a Skin/Subcutaneous Tissue Debridement ([id number]) debridement with total area of 6.25 sq cm performed by Maxwell Caul, MD. with the following instrument(s): Blade, Curette, and Forceps to remove Viable and Non-Viable tissue/material including Fat Layer (and Subcutaneous Tissue) Exposed, Fibrin/Slough, and Subcutaneous after achieving pain control using Other (lidocaine 4%). A time out was conducted at 14:25, prior to the start of the procedure. A Minimum amount of bleeding was controlled with Pressure. The procedure was tolerated well with a pain level of 0 throughout and a pain level of 0 following the procedure. Post Debridement Measurements: 2.56cm length x 2.5cm width x 0.7cm depth; 3.519cm^3 volume. Post debridement Stage noted as Category/Stage III. Character of Wound/Ulcer Post Debridement requires further debridement. Severity of Tissue Post Debridement is: Fat layer exposed. Post procedure Diagnosis Wound #1: Same as Pre-Procedure SANDEEP, DELAGARZA. ([id number]) Plan Wound Cleansing: Wound #1 Midline Coccyx: Clean wound with Normal Saline. May Shower, gently pat wound dry prior to applying new dressing. Anesthetic: Wound #1 Midline Coccyx: Topical Lidocaine 4% cream applied to wound bed prior to debridement Skin Barriers/Peri-Wound Care: Wound #1 Midline Coccyx: Skin Prep Primary Wound Dressing: Wound #1 Midline Coccyx: [name] Ointment Secondary Dressing: Wound #1 Midline Coccyx: Dry Gauze Boardered Foam Dressing - silicon bordered dressing due to skin breakdown Dressing Change Frequency: Wound #1 Midline Coccyx: Change  dressing every day. Follow-up Appointments: Wound #1 Midline Coccyx: Return Appointment in 1 week. Off-Loading: Wound #1 Midline Coccyx: Roho cushion for wheelchair - [facility] please order rojo cushion or gel cushion for patients wheelchair - whichever she qualifies for Turn and reposition every 2 hours Additional Orders / Instructions: Wound #1 Midline Coccyx: Increase protein intake. - please add protein supplements to patients diet Other: - please add vitamin A, vitamin C and zinc supplements to patients diet Home Health: Wound #1 Midline Coccyx: Continue Home Health Visits Home Health Nurse may visit PRN to address patient s wound care needs. FACE TO FACE ENCOUNTER: MEDICARE and MEDICAID PATIENTS: I certify that this patient is under my care and that I had a face-to-face encounter that meets the physician face-to-face encounter requirements with this patient on this date. The encounter with the patient was in whole or in part for the following MEDICAL CONDITION: (primary reason for Home Healthcare) MEDICAL NECESSITY: I certify, that based on my findings, NURSING services are a medically necessary home health service. HOME BOUND STATUS: I certify that my  clinical findings support that this patient is homebound (i.e., Due to illness or injury, pt requires aid of supportive devices such as crutches, cane, wheelchairs, walkers, the use of special transportation or the assistance of another person to leave their place of residence. There is a normal inability to leave the home and doing so requires considerable and taxing effort. Other absences are ODELIA, GRACIANO ([id number]) for medical reasons / religious services and are infrequent or of short duration when for other reasons). If current dressing causes regression in wound condition, may D/C ordered dressing product/s and apply Normal Saline Moist Dressing daily until next Wound Healing Center / Other MD appointment. Notify  Wound Healing Center of regression in wound condition at [phone]. Please direct any NON-WOUND related issues/requests for orders to patient's Primary Care Physician Medications-please add to medication list.: Wound #1 Midline Coccyx: [name] Enzymatic Ointment extensive debridement,continue santyl wouild like to possibly change to collagen or hydrofera Electronic Signature(s) Signed: 01/14/2017 7:57:33 AM By: Baltazar Najjar MD Entered By: Baltazar Najjar on 01/13/2017 15:38:58 [name] ([id number]) -------------------------------------------------------------------------------- SuperBill Details Patient Name: [name] Date of Service: 01/13/2017 Medical Record Patient Account Number: [id number] Number: Treating RN: Huel Coventry 03-22-1923 (83 y.o. Other Clinician: Date of Birth/Sex: Female) Treating Rhianon Zabawa Primary Care Provider: Vonita Moss Provider/Extender: G Referring Provider: Vonita Moss Weeks in Treatment: 0 Diagnosis Coding ICD-10 Codes Code Description L89.150 Pressure ulcer of sacral region, unstageable G20 Parkinson's disease Facility Procedures CPT4 Code: [id number] Description: 11042 - DEB SUBQ TISSUE 20 SQ CM/< ICD-10 Description Diagnosis L89.150 Pressure ulcer of sacral region, unstageable Modifier: Quantity: 1 Physician Procedures CPT4 Code: [id number] Description: 11042 - WC PHYS SUBQ TISS 20 SQ CM ICD-10 Description Diagnosis L89.150 Pressure ulcer of sacral region, unstageable Modifier: Quantity: 1 Electronic Signature(s) Signed: 01/14/2017 7:57:33 AM By: Baltazar Najjar MD Entered By: Baltazar Najjar on 01/13/2017 15:39:17

## 2017-01-20 ENCOUNTER — Encounter: Payer: Medicare Other | Admitting: Internal Medicine

## 2017-01-20 DIAGNOSIS — L89150 Pressure ulcer of sacral region, unstageable: Secondary | ICD-10-CM | POA: Diagnosis not present

## 2017-01-21 NOTE — Progress Notes (Addendum)
CHANTALE, LEUGERS ([id number]) Visit Report for 01/20/2017 Chief Complaint Document Details Patient Name: [name], [name] Date of Service: 01/20/2017 3:00 PM Medical Record Patient Account Number: [id number] Number: Treating RN: Curtis Sites Oct 27, 1922 (83 y.o. Other Clinician: Date of Birth/Sex: Female) Treating Psalms Olarte Primary Care Provider: Vonita Moss Provider/Extender: G Referring Provider: Vonita Moss Weeks in Treatment: 1 Information Obtained from: Patient Chief Complaint 01/07/17; patient is here for review of a sacral pressure ulcer Electronic Signature(s) Signed: 01/21/2017 7:39:10 AM By: Baltazar Najjar MD Entered By: Baltazar Najjar on 01/20/2017 15:58:07 [name] ([id number]) -------------------------------------------------------------------------------- HPI Details Patient Name: [name] Date of Service: 01/20/2017 3:00 PM Medical Record Patient Account Number: [id number] Number: Treating RN: Curtis Sites 18-Jan-1923 (83 y.o. Other Clinician: Date of Birth/Sex: Female) Treating Lorn Butcher Primary Care Provider: Vonita Moss Provider/Extender: G Referring Provider: Vonita Moss Weeks in Treatment: 1 History of Present Illness HPI Description: 01/07/17 this is a 83 year old woman admitted to the clinic today for review of a pressure ulcer on her lower sacrum. She is referred from her primary physician's office after being seen on 3/22 with a 3 cm pressure area. Her daughter and caretaker accompanied her today state that the area first became obvious about a month ago and his since deteriorated. They have recently got [name] a home health involved and have been using [name] to the wound. They have ordered a pressure relief surface for her mattress. They are turning her religiously. They state that she eats well and they've been forcing fluids on her. She is on a multivitamin. Looking through [facility]  point last albumin I see was 4.4 on 10/28. The patient has advanced parkinsonism which looks superficially like advanced Parkinson's disease although her daughter tells me she did not ever respond to Sinemet therefore this may have another pathology with signs of parkinsonism. However I think this is largely a mute point currently. She also has dementia and is nonambulatory. Since this started they have been keeping her in bed and turning her religiously every 2 hours. She lives at home in [place] with her husband with 24/7 care giving 01/13/17 santyl change qd. Still will require further debridement. continue santyl. 01/20/17; patient's wound actually looks some better less adherent necrotic surface. There is actually visible granulation. We're using [employer]) Signed: 01/21/2017 7:39:10 AM By: Baltazar Najjar MD Entered By: Baltazar Najjar on 01/20/2017 15:58:49 [name] ([id number]) -------------------------------------------------------------------------------- Physical Exam Details Patient Name: [name] Date of Service: 01/20/2017 3:00 PM Medical Record Patient Account Number: [id number] Number: Treating RN: Curtis Sites 1923-09-24 (83 y.o. Other Clinician: Date of Birth/Sex: Female) Treating Alletta Mattos Primary Care Provider: Vonita Moss Provider/Extender: G Referring Provider: Vonita Moss Weeks in Treatment: 1 Constitutional Sitting or standing Blood Pressure is within target range for patient.. Pulse regular and within target range for patient.Marland Kitchen Respirations regular, non-labored and within target range.. Temperature is normal and within the target range for the patient.. Very frail elderly woman.. Eyes Conjunctivae clear. No discharge.Marland Kitchen Respiratory Respiratory effort is easy and symmetric bilaterally. Rate is normal at rest and on room air.. Bilateral breath sounds are clear and equal in all lobes with no wheezes,  rales or rhonchi.. Cardiovascular Heart rhythm and rate regular, without murmur or gallop. She does not appear dehydrated. Gastrointestinal (GI) Abdomen is soft and non-distended without masses or tenderness. Bowel sounds active in all quadrants.. No liver or spleen enlargement or tenderness.. Genitourinary (GU) Bladder without fullness, masses or tenderness.Marland Kitchen Psychiatric  Minimally verbal. Notes When exam; the wound bed actually looked a lot better today. The circumference of the wound looks more viable and over the surface there are visible islands of reasonably healthy-looking granulation. I did not elect to do any debridement today however further debridement is likely to be necessary. Will be continuing Engineer, civil (consulting)) Signed: 01/21/2017 7:39:10 AM By: Baltazar Najjar MD Entered By: Baltazar Najjar on 01/20/2017 16:01:07 [name] ([id number]) -------------------------------------------------------------------------------- Physician Orders Details Patient Name: [name] Date of Service: 01/20/2017 3:00 PM Medical Record Patient Account Number: [id number] Number: Treating RN: Curtis Sites Jul 14, 1923 (83 y.o. Other Clinician: Date of Birth/Sex: Female) Treating Rik Wadel Primary Care Provider: Vonita Moss Provider/Extender: G Referring Provider: Vonita Moss Weeks in Treatment: 1 Verbal / Phone Orders: No Diagnosis Coding Wound Cleansing Wound #1 Midline Coccyx o Clean wound with Normal Saline. o May Shower, gently pat wound dry prior to applying new dressing. Anesthetic Wound #1 Midline Coccyx o Topical Lidocaine 4% cream applied to wound bed prior to debridement Skin Barriers/Peri-Wound Care Wound #1 Midline Coccyx o Skin Prep Primary Wound Dressing Wound #1 Midline Coccyx o [name] Ointment Secondary Dressing Wound #1 Midline Coccyx o Dry Gauze o Boardered Foam Dressing - silicon bordered dressing  due to skin breakdown Dressing Change Frequency Wound #1 Midline Coccyx o Change dressing every day. Follow-up Appointments Wound #1 Midline Coccyx o Return Appointment in 1 week. Off-Loading Wound #1 Midline Coccyx NEVAEHA, FINERTY. ([id number]) o Roho cushion for wheelchair - [facility] please order rojo cushion or gel cushion for patients wheelchair - whichever she qualifies for o Turn and reposition every 2 hours Additional Orders / Instructions Wound #1 Midline Coccyx o Increase protein intake. - please add protein supplements to patients diet o Other: - please add vitamin A, vitamin C and zinc supplements to patients diet Home Health Wound #1 Midline Coccyx o Continue Home Health Visits o Home Health Nurse may visit PRN to address patientos wound care needs. o FACE TO FACE ENCOUNTER: MEDICARE and MEDICAID PATIENTS: I certify that this patient is under my care and that I had a face-to-face encounter that meets the physician face-to-face encounter requirements with this patient on this date. The encounter with the patient was in whole or in part for the following MEDICAL CONDITION: (primary reason for Home Healthcare) MEDICAL NECESSITY: I certify, that based on my findings, NURSING services are a medically necessary home health service. HOME BOUND STATUS: I certify that my clinical findings support that this patient is homebound (i.e., Due to illness or injury, pt requires aid of supportive devices such as crutches, cane, wheelchairs, walkers, the use of special transportation or the assistance of another person to leave their place of residence. There is a normal inability to leave the home and doing so requires considerable and taxing effort. Other absences are for medical reasons / religious services and are infrequent or of short duration when for other reasons). o If current dressing causes regression in wound condition, may D/C ordered dressing product/s and  apply Normal Saline Moist Dressing daily until next Wound Healing Center / Other MD appointment. Notify Wound Healing Center of regression in wound condition at [phone]. o Please direct any NON-WOUND related issues/requests for orders to patient's Primary Care Physician Medications-please add to medication list. Wound #1 Midline Coccyx o [name] Enzymatic Ointment Electronic Signature(s) Signed: 01/20/2017 4:05:06 PM By: Curtis Sites Signed: 01/21/2017 7:39:10 AM By: Baltazar Najjar MD Entered By: Curtis Sites on 01/20/2017 15:51:42 Dorey, Dru  Lacretia Nicks ([id number]) -------------------------------------------------------------------------------- Problem List Details Patient Name: SURABHI, GADEA Date of Service: 01/20/2017 3:00 PM Medical Record Patient Account Number: [id number] Number: Treating RN: Curtis Sites 25-May-1923 (83 y.o. Other Clinician: Date of Birth/Sex: Female) Treating Kaneisha Ellenberger Primary Care Provider: Vonita Moss Provider/Extender: G Referring Provider: Vonita Moss Weeks in Treatment: 1 Active Problems ICD-10 Encounter Code Description Active Date Diagnosis L89.150 Pressure ulcer of sacral region, unstageable 01/07/2017 Yes G20 Parkinson's disease 01/07/2017 Yes Inactive Problems Resolved Problems Electronic Signature(s) Signed: 01/21/2017 7:39:10 AM By: Baltazar Najjar MD Entered By: Baltazar Najjar on 01/20/2017 15:57:53 [name] ([id number]) -------------------------------------------------------------------------------- Progress Note Details Patient Name: [name] Date of Service: 01/20/2017 3:00 PM Medical Record Patient Account Number: [id number] Number: Treating RN: Curtis Sites Jun 20, 1923 (83 y.o. Other Clinician: Date of Birth/Sex: Female) Treating Britiany Silbernagel Primary Care Provider: Vonita Moss Provider/Extender: G Referring Provider: Vonita Moss Weeks in Treatment:  1 Subjective Chief Complaint Information obtained from Patient 01/07/17; patient is here for review of a sacral pressure ulcer History of Present Illness (HPI) 01/07/17 this is a 83 year old woman admitted to the clinic today for review of a pressure ulcer on her lower sacrum. She is referred from her primary physician's office after being seen on 3/22 with a 3 cm pressure area. Her daughter and caretaker accompanied her today state that the area first became obvious about a month ago and his since deteriorated. They have recently got [name] a home health involved and have been using [name] to the wound. They have ordered a pressure relief surface for her mattress. They are turning her religiously. They state that she eats well and they've been forcing fluids on her. She is on a multivitamin. Looking through [facility] point last albumin I see was 4.4 on 10/28. The patient has advanced parkinsonism which looks superficially like advanced Parkinson's disease although her daughter tells me she did not ever respond to Sinemet therefore this may have another pathology with signs of parkinsonism. However I think this is largely a mute point currently. She also has dementia and is nonambulatory. Since this started they have been keeping her in bed and turning her religiously every 2 hours. She lives at home in [place] with her husband with 24/7 care giving 01/13/17 santyl change qd. Still will require further debridement. continue santyl. 01/20/17; patient's wound actually looks some better less adherent necrotic surface. There is actually visible granulation. We're using [name] Objective Constitutional Sitting or standing Blood Pressure is within target range for patient.. Pulse regular and within target range for patient.Marland Kitchen Respirations regular, non-labored and within target range.. Temperature is normal and within the target range for the patient.. Very frail elderly woman.. Vitals Time Taken:  3:11 PM, Temperature: 98.1 F, Pulse: 59 bpm, Respiratory Rate: 16 breaths/min, Blood Pressure: 132/95 mmHg. CAMRI, MOLLOY ([id number]) Eyes Conjunctivae clear. No discharge.Marland Kitchen Respiratory Respiratory effort is easy and symmetric bilaterally. Rate is normal at rest and on room air.. Bilateral breath sounds are clear and equal in all lobes with no wheezes, rales or rhonchi.. Cardiovascular Heart rhythm and rate regular, without murmur or gallop. She does not appear dehydrated. Gastrointestinal (GI) Abdomen is soft and non-distended without masses or tenderness. Bowel sounds active in all quadrants.. No liver or spleen enlargement or tenderness.. Genitourinary (GU) Bladder without fullness, masses or tenderness.Marland Kitchen Psychiatric Minimally verbal. General Notes: When exam; the wound bed actually looked a lot better today. The circumference of the wound looks more viable and over the surface there are  visible islands of reasonably healthy-looking granulation. I did not elect to do any debridement today however further debridement is likely to be necessary. Will be continuing [name] Integumentary (Hair, Skin) Wound #1 status is Open. Original cause of wound was Pressure Injury. The wound is located on the Midline Coccyx. The wound measures 2.6cm length x 2.3cm width x 0.5cm depth; 4.697cm^2 area and 2.348cm^3 volume. The wound is limited to skin breakdown. There is no tunneling or undermining noted. There is a large amount of serous drainage noted. The wound margin is flat and intact. There is medium (34-66%) granulation within the wound bed. There is a medium (34-66%) amount of necrotic tissue within the wound bed including Eschar and Adherent Slough. The periwound skin appearance exhibited: Erythema. The periwound skin appearance did not exhibit: Callus, Crepitus, Excoriation, Induration, Rash, Scarring, Dry/Scaly, Maceration, Atrophie Blanche, Cyanosis, Ecchymosis, Hemosiderin Staining,  Mottled, Pallor, Rubor. The surrounding wound skin color is noted with erythema which is circumferential. The periwound has tenderness on palpation. Assessment Active Problems ICD-10 L89.150 - Pressure ulcer of sacral region, unstageable G20 - Parkinson's disease ROYAL, BEIRNE. ([id number]) Plan Wound Cleansing: Wound #1 Midline Coccyx: Clean wound with Normal Saline. May Shower, gently pat wound dry prior to applying new dressing. Anesthetic: Wound #1 Midline Coccyx: Topical Lidocaine 4% cream applied to wound bed prior to debridement Skin Barriers/Peri-Wound Care: Wound #1 Midline Coccyx: Skin Prep Primary Wound Dressing: Wound #1 Midline Coccyx: [name] Ointment Secondary Dressing: Wound #1 Midline Coccyx: Dry Gauze Boardered Foam Dressing - silicon bordered dressing due to skin breakdown Dressing Change Frequency: Wound #1 Midline Coccyx: Change dressing every day. Follow-up Appointments: Wound #1 Midline Coccyx: Return Appointment in 1 week. Off-Loading: Wound #1 Midline Coccyx: Roho cushion for wheelchair - [facility] please order rojo cushion or gel cushion for patients wheelchair - whichever she qualifies for Turn and reposition every 2 hours Additional Orders / Instructions: Wound #1 Midline Coccyx: Increase protein intake. - please add protein supplements to patients diet Other: - please add vitamin A, vitamin C and zinc supplements to patients diet Home Health: Wound #1 Midline Coccyx: Continue Home Health Visits Home Health Nurse may visit PRN to address patient s wound care needs. FACE TO FACE ENCOUNTER: MEDICARE and MEDICAID PATIENTS: I certify that this patient is under my care and that I had a face-to-face encounter that meets the physician face-to-face encounter requirements with this patient on this date. The encounter with the patient was in whole or in part for the following MEDICAL CONDITION: (primary reason for Home Healthcare) MEDICAL NECESSITY: I  certify, that based on my findings, NURSING services are a medically necessary home health service. HOME BOUND STATUS: I certify that my clinical findings support that this patient is homebound (i.e., Due to DAZIYA, REDMOND. ([id number]) illness or injury, pt requires aid of supportive devices such as crutches, cane, wheelchairs, walkers, the use of special transportation or the assistance of another person to leave their place of residence. There is a normal inability to leave the home and doing so requires considerable and taxing effort. Other absences are for medical reasons / religious services and are infrequent or of short duration when for other reasons). If current dressing causes regression in wound condition, may D/C ordered dressing product/s and apply Normal Saline Moist Dressing daily until next Wound Healing Center / Other MD appointment. Notify Wound Healing Center of regression in wound condition at [phone]. Please direct any NON-WOUND related issues/requests for orders to patient's Primary Care Physician Medications-please  add to medication list.: Wound #1 Midline Coccyx: [name] Enzymatic Ointment #1 continue [name], barrier cream and border foam 2 she has some areas of oval-shaped blanchable erythema around the wound both of the left into the right. I wonder whether this is a developing fungal infection and have suggested Lotrimin. We will look at this next week #3 although the patient's daughter and her caregiver state that she eats well she looks very frail. #4 likely require mechanical debridement again next week continue [name]-based dressings for now. Notable that they're paying the full price for the [name] out-of-pocket Electronic Signature(s) Signed: 01/27/2017 4:10:14 PM By: Elliot Gurney RN, BSN, Kim RN, BSN Signed: 01/28/2017 7:54:35 AM By: Baltazar Najjar MD Previous Signature: 01/21/2017 7:39:10 AM Version By: Baltazar Najjar MD Entered By: Elliot Gurney RN, BSN, Kim on  01/27/2017 16:10:14 [name] ([id number]) -------------------------------------------------------------------------------- SuperBill Details Patient Name: [name] Date of Service: 01/20/2017 Medical Record Patient Account Number: [id number] Number: Treating RN: Curtis Sites [birth year] (83 y.o. Other Clinician: Date of Birth/Sex: Female) Treating Swan Zayed Primary Care Provider: Vonita Moss Provider/Extender: G Referring Provider: Vonita Moss Weeks in Treatment: 1 Diagnosis Coding ICD-10 Codes Code Description L89.150 Pressure ulcer of sacral region, unstageable G20 Parkinson's disease Facility Procedures CPT4 Code: [id number] Description: [phone] - WOUND CARE VISIT-LEV 2 EST PT Modifier: Quantity: 1 Physician Procedures CPT4 Code: [id number] Description: 99213 - WC PHYS LEVEL 3 - EST PT ICD-10 Description Diagnosis L89.150 Pressure ulcer of sacral region, unstageable Modifier: Quantity: 1 Electronic Signature(s) Signed: 01/21/2017 7:39:10 AM By: Baltazar Najjar MD Entered By: Baltazar Najjar on 01/20/2017 16:03:03

## 2017-01-22 NOTE — Progress Notes (Signed)
[name], HOPES ([id number]) Visit Report for 01/20/2017 Arrival Information Details Patient Name: [name], [name] Date of Service: 01/20/2017 3:00 PM Medical Record Patient Account Number: [id number] Number: Treating RN: Curtis Sites Oct 31, 1922 (83 y.o. Other Clinician: Date of Birth/Sex: Female) Treating ROBSON, MICHAEL Primary Care Athira Janowicz: Vonita Moss Delaney Perona/Extender: G Referring Amberlyn Martinezgarcia: Vonita Moss Weeks in Treatment: 1 Visit Information History Since Last Visit Added or deleted any medications: No Patient Arrived: Wheel Chair Any new allergies or adverse No reactions: Arrival Time: 15:10 Had a fall or experienced change in No Accompanied By: dtr and cg activities of daily living that may Transfer Assistance: Manual affect Patient Identification Verified: Yes risk of falls: Secondary Verification Process Yes Signs or symptoms of abuse/neglect No Completed: since last visito Hospitalized since last visit: No Has Dressing in Place as Yes Prescribed: Pain Present Now: Unable to Respond Electronic Signature(s) Signed: 01/20/2017 4:05:06 PM By: Curtis Sites Entered By: Curtis Sites on 01/20/2017 15:10:50 [name] ([id number]) -------------------------------------------------------------------------------- Clinic Level of Care Assessment Details Patient Name: [name] Date of Service: 01/20/2017 3:00 PM Medical Record Patient Account Number: [id number] Number: Treating RN: Curtis Sites [birth year] (83 y.o. Other Clinician: Date of Birth/Sex: Female) Treating ROBSON, MICHAEL Primary Care Rieley Hausman: Vonita Moss Britney Newstrom/Extender: G Referring Lylia Karn: Vonita Moss Weeks in Treatment: 1 Clinic Level of Care Assessment Items TOOL 4 Quantity Score  - Use when only an EandM is performed on FOLLOW-UP visit 0 ASSESSMENTS - Nursing Assessment / Reassessment X - Reassessment of Co-morbidities (includes updates  in patient status) 1 10 X - Reassessment of Adherence to Treatment Plan 1 5 ASSESSMENTS - Wound and Skin Assessment / Reassessment X - Simple Wound Assessment / Reassessment - one wound 1 5  - Complex Wound Assessment / Reassessment - multiple wounds 0  - Dermatologic / Skin Assessment (not related to wound area) 0 ASSESSMENTS - Focused Assessment  - Circumferential Edema Measurements - multi extremities 0  - Nutritional Assessment / Counseling / Intervention 0  - Lower Extremity Assessment (monofilament, tuning fork, pulses) 0  - Peripheral Arterial Disease Assessment (using hand held doppler) 0 ASSESSMENTS - Ostomy and/or Continence Assessment and Care  - Incontinence Assessment and Management 0  - Ostomy Care Assessment and Management (repouching, etc.) 0 PROCESS - Coordination of Care X - Simple Patient / Family Education for ongoing care 1 15  - Complex (extensive) Patient / Family Education for ongoing care 0  - Staff obtains Chiropractor, Records, Test Results / Process Orders 0  - Staff telephones HHA, Nursing Homes / Clarify orders / etc 0 [name], [name] ([id number])  - Routine Transfer to another Facility (non-emergent condition) 0  - Routine Hospital Admission (non-emergent condition) 0  - New Admissions / Manufacturing engineer / Ordering NPWT, Apligraf, etc. 0  - Emergency Hospital Admission (emergent condition) 0 X - Simple Discharge Coordination 1 10  - Complex (extensive) Discharge Coordination 0 PROCESS - Special Needs  - Pediatric / Minor Patient Management 0  - Isolation Patient Management 0  - Hearing / Language / Visual special needs 0  - Assessment of Community assistance (transportation, D/C planning, etc.) 0  - Additional assistance / Altered mentation 0  - Support Surface(s) Assessment (bed, cushion, seat, etc.) 0 INTERVENTIONS - Wound Cleansing / Measurement X - Simple Wound Cleansing - one wound 1 5  - Complex  Wound Cleansing - multiple wounds 0 X - Wound Imaging (photographs - any number of wounds) 1 5  - Wound Tracing (instead  of photographs) 0 X - Simple Wound Measurement - one wound 1 5  - Complex Wound Measurement - multiple wounds 0 INTERVENTIONS - Wound Dressings X - Small Wound Dressing one or multiple wounds 1 10  - Medium Wound Dressing one or multiple wounds 0  - Large Wound Dressing one or multiple wounds 0  - Application of Medications - topical 0  - Application of Medications - injection 0 [name], [name] ([id number]) INTERVENTIONS - Miscellaneous  - External ear exam 0  - Specimen Collection (cultures, biopsies, blood, body fluids, etc.) 0  - Specimen(s) / Culture(s) sent or taken to Lab for analysis 0  - Patient Transfer (multiple staff / Michiel Sites Lift / Similar devices) 0  - Simple Staple / Suture removal (25 or less) 0  - Complex Staple / Suture removal (26 or more) 0  - Hypo / Hyperglycemic Management (close monitor of Blood Glucose) 0  - Ankle / Brachial Index (ABI) - do not check if billed separately 0 X - Vital Signs 1 5 Has the patient been seen at the hospital within the last three years: Yes Total Score: 75 Level Of Care: New/Established - Level 2 Electronic Signature(s) Signed: 01/20/2017 4:05:06 PM By: Curtis Sites Entered By: Curtis Sites on 01/20/2017 15:52:08 [name] ([id number]) -------------------------------------------------------------------------------- Encounter Discharge Information Details Patient Name: [name] Date of Service: 01/20/2017 3:00 PM Medical Record Patient Account Number: [id number] Number: Treating RN: Curtis Sites 10-24-1922 (83 y.o. Other Clinician: Date of Birth/Sex: Female) Treating ROBSON, MICHAEL Primary Care Sonny Anthes: Vonita Moss Samiksha Pellicano/Extender: G Referring Sashay Felling: Vonita Moss Weeks in Treatment: 1 Encounter Discharge Information Items Discharge Pain  Level: 0 Discharge Condition: Stable Ambulatory Status: Wheelchair Discharge Destination: Home Transportation: Private Auto Accompanied By: dtr Schedule Follow-up Appointment: Yes Medication Reconciliation completed and provided to Patient/Care No Raeshawn Vo: Provided on Clinical Summary of Care: 01/20/2017 Form Type Recipient Paper Patient ES Electronic Signature(s) Signed: 01/20/2017 4:05:24 PM By: Gwenlyn Perking Previous Signature: 01/20/2017 4:05:06 PM Version By: Curtis Sites Entered By: Gwenlyn Perking on 01/20/2017 16:05:24 [name] ([id number]) -------------------------------------------------------------------------------- Multi Wound Chart Details Patient Name: [name] Date of Service: 01/20/2017 3:00 PM Medical Record Patient Account Number: [id number] Number: Treating RN: Curtis Sites [birth year] (83 y.o. Other Clinician: Date of Birth/Sex: Female) Treating ROBSON, MICHAEL Primary Care Shernell Saldierna: Vonita Moss Aleshia Cartelli/Extender: G Referring Kiaria Quinnell: Vonita Moss Weeks in Treatment: 1 Vital Signs Height(in): Pulse(bpm): 59 Weight(lbs): Blood Pressure 132/95 (mmHg): Body Mass Index(BMI): Temperature(F): 98.1 Respiratory Rate 16 (breaths/min): Photos: [1:No Photos] [N/A:N/A] Wound Location: [1:Coccyx - Midline] [N/A:N/A] Wounding Event: [1:Pressure Injury] [N/A:N/A] Primary Etiology: [1:Pressure Ulcer] [N/A:N/A] Comorbid History: [1:Anemia, Hypertension, Dementia] [N/A:N/A] Date Acquired: [1:12/01/2016] [N/A:N/A] Weeks of Treatment: [1:1] [N/A:N/A] Wound Status: [1:Open] [N/A:N/A] Measurements L x W x D 2.6x2.3x0.5 [N/A:N/A] (cm) Area (cm) : [1:4.697] [N/A:N/A] Volume (cm) : [1:2.348] [N/A:N/A] % Reduction in Area: [1:-13.00%] [N/A:N/A] % Reduction in Volume: -41.30% [N/A:N/A] Classification: [1:Category/Stage III] [N/A:N/A] Exudate Amount: [1:Large] [N/A:N/A] Exudate Type: [1:Serous] [N/A:N/A] Exudate Color: [1:amber]  [N/A:N/A] Wound Margin: [1:Flat and Intact] [N/A:N/A] Granulation Amount: [1:Medium (34-66%)] [N/A:N/A] Necrotic Amount: [1:Medium (34-66%)] [N/A:N/A] Necrotic Tissue: [1:Eschar, Adherent Slough] [N/A:N/A] Exposed Structures: [1:Fascia: No Fat Layer (Subcutaneous Tissue) Exposed: No Tendon: No Muscle: No] [N/A:N/A] Joint: No Bone: No Limited to Skin Breakdown Epithelialization: None N/A N/A Periwound Skin Texture: Excoriation: No N/A N/A Induration: No Callus: No Crepitus: No Rash: No Scarring: No Periwound Skin Maceration: No N/A N/A Moisture: Dry/Scaly: No Periwound Skin Color: Erythema: Yes N/A N/A Atrophie  Blanche: No Cyanosis: No Ecchymosis: No Hemosiderin Staining: No Mottled: No Pallor: No Rubor: No Erythema Location: Circumferential N/A N/A Tenderness on Yes N/A N/A Palpation: Wound Preparation: Ulcer Cleansing: N/A N/A Rinsed/Irrigated with Saline Topical Anesthetic Applied: Other: lidocaine 4% Treatment Notes Wound #1 (Midline Coccyx) 1. Cleansed with: Clean wound with Normal Saline 2. Anesthetic Topical Lidocaine 4% cream to wound bed prior to debridement 3. Peri-wound Care: Barrier cream 4. Dressing Applied: [name] Ointment 5. Secondary Dressing Applied Bordered Foam Dressing Dry Gauze Electronic Signature(s) MARSHAL, SCHRECENGOST ([id number]) Signed: 01/21/2017 7:39:10 AM By: Baltazar Najjar MD Entered By: Baltazar Najjar on 01/20/2017 15:57:59 [name] ([id number]) -------------------------------------------------------------------------------- Multi-Disciplinary Care Plan Details Patient Name: [name] Date of Service: 01/20/2017 3:00 PM Medical Record Patient Account Number: [id number] Number: Treating RN: Curtis Sites [birth year] (83 y.o. Other Clinician: Date of Birth/Sex: Female) Treating ROBSON, MICHAEL Primary Care Shakela Donati: Vonita Moss Kohei Antonellis/Extender: G Referring Lakashia Collison: Vonita Moss Weeks in  Treatment: 1 Active Inactive ` Abuse / Safety / Falls / Self Care Management Nursing Diagnoses: Impaired physical mobility Potential for falls Goals: Patient will remain injury free Date Initiated: 01/07/2017 Target Resolution Date: 04/03/2017 Goal Status: Active Interventions: Assess fall risk on admission and as needed Notes: ` Nutrition Nursing Diagnoses: Potential for alteratiion in Nutrition/Potential for imbalanced nutrition Goals: Patient/caregiver agrees to and verbalizes understanding of need to use nutritional supplements and/or vitamins as prescribed Date Initiated: 01/07/2017 Target Resolution Date: 04/03/2017 Goal Status: Active Interventions: Assess patient nutrition upon admission and as needed per policy Notes: ` Orientation to the Wound Care Program MERRICK, FEUTZ ([id number]) Nursing Diagnoses: Knowledge deficit related to the wound healing center program Goals: Patient/caregiver will verbalize understanding of the Wound Healing Center Program Date Initiated: 01/07/2017 Target Resolution Date: 04/03/2017 Goal Status: Active Interventions: Provide education on orientation to the wound center Notes: ` Pressure Nursing Diagnoses: Knowledge deficit related to causes and risk factors for pressure ulcer development Goals: Patient will remain free from development of additional pressure ulcers Date Initiated: 01/07/2017 Target Resolution Date: 04/03/2017 Goal Status: Active Interventions: Assess potential for pressure ulcer upon admission and as needed Notes: ` Wound/Skin Impairment Nursing Diagnoses: Impaired tissue integrity Goals: Patient/caregiver will verbalize understanding of skin care regimen Date Initiated: 01/07/2017 Target Resolution Date: 04/03/2017 Goal Status: Active Ulcer/skin breakdown will have a volume reduction of 30% by week 4 Date Initiated: 01/07/2017 Target Resolution Date: 04/03/2017 Goal Status: Active Ulcer/skin breakdown will  have a volume reduction of 50% by week 8 Date Initiated: 01/07/2017 Target Resolution Date: 04/03/2017 Goal Status: Active Ulcer/skin breakdown will have a volume reduction of 80% by week 12 BRETTA, FEES ([id number]) Date Initiated: 01/07/2017 Target Resolution Date: 04/03/2017 Goal Status: Active Ulcer/skin breakdown will heal within 14 weeks Date Initiated: 01/07/2017 Target Resolution Date: 04/03/2017 Goal Status: Active Interventions: Assess patient/caregiver ability to obtain necessary supplies Assess patient/caregiver ability to perform ulcer/skin care regimen upon admission and as needed Assess ulceration(s) every visit Notes: Electronic Signature(s) Signed: 01/20/2017 4:05:06 PM By: Curtis Sites Entered By: Curtis Sites on 01/20/2017 15:23:05 [name] ([id number]) -------------------------------------------------------------------------------- Pain Assessment Details Patient Name: [name] Date of Service: 01/20/2017 3:00 PM Medical Record Patient Account Number: [id number] Number: Treating RN: Curtis Sites [birth year] (83 y.o. Other Clinician: Date of Birth/Sex: Female) Treating ROBSON, MICHAEL Primary Care Kessie Croston: Vonita Moss Jaremy Nosal/Extender: G Referring Ernie Kasler: Vonita Moss Weeks in Treatment: 1 Active Problems Location of Pain Severity and Description of Pain Patient Has Paino No Site Locations Pain Management and  Medication Current Pain Management: Electronic Signature(s) Signed: 01/20/2017 4:05:06 PM By: Curtis Sites Entered By: Curtis Sites on 01/20/2017 15:11:13 [name] ([id number]) -------------------------------------------------------------------------------- Patient/Caregiver Education Details Patient Name: [name] Date of Service: 01/20/2017 3:00 PM Medical Record Patient Account Number: [id number] Number: Treating RN: Curtis Sites 1923-03-04 (83 y.o. Other Clinician: Date of  Birth/Gender: Female) Treating ROBSON, MICHAEL Primary Care Physician: Vonita Moss Physician/Extender: G Referring Physician: Veverly Fells in Treatment: 1 Education Assessment Education Provided To: Caregiver Education Topics Provided Nutrition: Handouts: Nutrition Methods: Explain/Verbal Responses: State content correctly Wound/Skin Impairment: Handouts: Other: wound care as ordered Methods: Demonstration, Explain/Verbal Responses: State content correctly Electronic Signature(s) Signed: 01/20/2017 4:05:06 PM By: Curtis Sites Entered By: Curtis Sites on 01/20/2017 15:24:13 [name] ([id number]) -------------------------------------------------------------------------------- Wound Assessment Details Patient Name: [name] Date of Service: 01/20/2017 3:00 PM Medical Record Patient Account Number: [id number] Number: Treating RN: Curtis Sites 10-08-1922 (83 y.o. Other Clinician: Date of Birth/Sex: Female) Treating ROBSON, MICHAEL Primary Care Chloe Baig: Vonita Moss Gricelda Foland/Extender: G Referring Odaly Peri: Vonita Moss Weeks in Treatment: 1 Wound Status Wound Number: 1 Primary Etiology: Pressure Ulcer Wound Location: Coccyx - Midline Wound Status: Open Wounding Event: Pressure Injury Comorbid History: Anemia, Hypertension, Dementia Date Acquired: 12/01/2016 Weeks Of Treatment: 1 Clustered Wound: No Photos Wound Measurements Length: (cm) 2.6 Width: (cm) 2.3 Depth: (cm) 0.5 Area: (cm) 4.697 Volume: (cm) 2.348 % Reduction in Area: -13% % Reduction in Volume: -41.3% Epithelialization: None Tunneling: No Undermining: No Wound Description Classification: Category/Stage III Foul Odor After C Wound Margin: Flat and Intact Slough/Fibrino Exudate Amount: Large Exudate Type: Serous Exudate Color: amber leansing: No Yes Wound Bed Granulation Amount: Medium (34-66%) Exposed Structure Necrotic Amount: Medium  (34-66%) Fascia Exposed: No Necrotic Quality: Eschar, Adherent Slough Fat Layer (Subcutaneous Tissue) Exposed: No MAYSEL, MCCOLM. ([id number]) Tendon Exposed: No Muscle Exposed: No Joint Exposed: No Bone Exposed: No Limited to Skin Breakdown Periwound Skin Texture Texture Color No Abnormalities Noted: No No Abnormalities Noted: No Callus: No Atrophie Blanche: No Crepitus: No Cyanosis: No Excoriation: No Ecchymosis: No Induration: No Erythema: Yes Rash: No Erythema Location: Circumferential Scarring: No Hemosiderin Staining: No Mottled: No Moisture Pallor: No No Abnormalities Noted: No Rubor: No Dry / Scaly: No Maceration: No Temperature / Pain Tenderness on Palpation: Yes Wound Preparation Ulcer Cleansing: Rinsed/Irrigated with Saline Topical Anesthetic Applied: Other: lidocaine 4%, Treatment Notes Wound #1 (Midline Coccyx) 1. Cleansed with: Clean wound with Normal Saline 2. Anesthetic Topical Lidocaine 4% cream to wound bed prior to debridement 3. Peri-wound Care: Barrier cream 4. Dressing Applied: [name] Ointment 5. Secondary Dressing Applied Bordered Foam Dressing Dry Gauze Electronic Signature(s) Signed: 01/21/2017 5:41:54 PM By: Curtis Sites Previous Signature: 01/20/2017 4:05:06 PM Version By: Curtis Sites Entered By: Curtis Sites on 01/21/2017 11:42:23 [name] ([id number]) -------------------------------------------------------------------------------- Vitals Details Patient Name: [name] Date of Service: 01/20/2017 3:00 PM Medical Record Patient Account Number: [id number] Number: Treating RN: Curtis Sites 1922-12-24 (83 y.o. Other Clinician: Date of Birth/Sex: Female) Treating ROBSON, MICHAEL Primary Care Joann Jorge: Vonita Moss [place]/Extender: G Referring Suzanna Zahn: Vonita Moss Weeks in Treatment: 1 Vital Signs Time Taken: 15:11 Temperature (F): 98.1 Pulse (bpm): 59 Respiratory Rate  (breaths/min): 16 Blood Pressure (mmHg): 132/95 Reference Range: 80 - 120 mg / dl Electronic Signature(s) Signed: 01/20/2017 4:05:06 PM By: Curtis Sites Entered By: Curtis Sites on 01/20/2017 15:15:27

## 2017-01-27 ENCOUNTER — Encounter: Payer: Medicare Other | Attending: Internal Medicine | Admitting: Internal Medicine

## 2017-01-27 DIAGNOSIS — F0390 Unspecified dementia without behavioral disturbance: Secondary | ICD-10-CM | POA: Insufficient documentation

## 2017-01-27 DIAGNOSIS — G20 Parkinson's disease: Secondary | ICD-10-CM | POA: Diagnosis not present

## 2017-01-27 DIAGNOSIS — L89150 Pressure ulcer of sacral region, unstageable: Secondary | ICD-10-CM | POA: Insufficient documentation

## 2017-01-29 NOTE — Progress Notes (Signed)
ARDYS, HATAWAY ([id number]) Visit Report for 01/27/2017 Chief Complaint Document Details Patient Name: [name], [name] Date of Service: 01/27/2017 10:15 AM Medical Record Patient Account Number: [id number] Number: Treating RN: Clover Mealy, RN, BSN, Rita June 03, 1923 [phone] y.o. Other Clinician: Date of Birth/Sex: Female) Treating Madigan Rosensteel Primary Care Provider: Vonita Moss Provider/Extender: G Referring Provider: Vonita Moss Weeks in Treatment: 2 Information Obtained from: Patient Chief Complaint 01/07/17; patient is here for review of a sacral pressure ulcer Electronic Signature(s) Signed: 01/28/2017 7:53:49 AM By: Baltazar Najjar MD Entered By: Baltazar Najjar on 01/27/2017 12:28:39 [name] ([id number]) -------------------------------------------------------------------------------- Debridement Details Patient Name: [name] Date of Service: 01/27/2017 10:15 AM Medical Record Patient Account Number: [id number] Number: Treating RN: Clover Mealy, RN, BSN, Rita 1923-08-18 (83 y.o. Other Clinician: Date of Birth/Sex: Female) Treating Augusto Deckman Primary Care Provider: Vonita Moss Provider/Extender: G Referring Provider: Vonita Moss Weeks in Treatment: 2 Debridement Performed for Wound #1 Midline Coccyx Assessment: Performed By: Physician Maxwell Caul, MD Debridement: Debridement Pre-procedure Yes - 11:00 Verification/Time Out Taken: Start Time: 11:00 Pain Control: Lidocaine 4% Topical Solution Level: Skin/Subcutaneous Tissue Total Area Debrided (L x 3.5 (cm) x 2.2 (cm) = 7.7 (cm) W): Tissue and other Non-Viable, Exudate, Fat, Fibrin/Slough, Subcutaneous material debrided: Instrument: Curette Bleeding: Moderate Hemostasis Achieved: Pressure End Time: 11:05 Procedural Pain: 0 Post Procedural Pain: 0 Response to Treatment: Procedure was tolerated well Post Debridement Measurements of Total Wound Length: (cm) 3.5 Stage:  Category/Stage III Width: (cm) 2.2 Depth: (cm) 0.7 Volume: (cm) 4.233 Character of Wound/Ulcer Post Requires Further Debridement: Debridement Severity of Tissue Post Fat layer exposed Debridement: Post Procedure Diagnosis Same as Pre-procedure Electronic Signature(s) Signed: 01/27/2017 4:25:14 PM By: Elpidio Eric BSN, RN [name], [name] ([id number]) Signed: 01/28/2017 7:53:49 AM By: Baltazar Najjar MD Entered By: Baltazar Najjar on 01/27/2017 12:28:27 [name] ([id number]) -------------------------------------------------------------------------------- HPI Details Patient Name: [name] Date of Service: 01/27/2017 10:15 AM Medical Record Patient Account Number: [id number] Number: Treating RN: Clover Mealy, RN, BSN, Rita 11-25-1922 (83 y.o. Other Clinician: Date of Birth/Sex: Female) Treating Jerard Bays Primary Care Provider: Vonita Moss Provider/Extender: G Referring Provider: Vonita Moss Weeks in Treatment: 2 History of Present Illness HPI Description: 01/07/17 this is a 83 year old woman admitted to the clinic today for review of a pressure ulcer on her lower sacrum. She is referred from her primary physician's office after being seen on 3/22 with a 3 cm pressure area. Her daughter and caretaker accompanied her today state that the area first became obvious about a month ago and his since deteriorated. They have recently got [name] a home health involved and have been using [name] to the wound. They have ordered a pressure relief surface for her mattress. They are turning her religiously. They state that she eats well and they've been forcing fluids on her. She is on a multivitamin. Looking through [facility] point last albumin I see was 4.4 on 10/28. The patient has advanced parkinsonism which looks superficially like advanced Parkinson's disease although her daughter tells me she did not ever respond to Sinemet therefore this may have another  pathology with signs of parkinsonism. However I think this is largely a mute point currently. She also has dementia and is nonambulatory. Since this started they have been keeping her in bed and turning her religiously every 2 hours. She lives at home in [place] with her husband with 24/7 care giving 01/13/17 santyl change qd. Still will require further debridement. continue santyl. 01/20/17; patient's  wound actually looks some better less adherent necrotic surface. There is actually visible granulation. We're using [name] 01/27/17; better-looking surface but still a lot of necrotic tissue on the base of this wound. The periwound erythema is better than last week we are still using [name]. Her daughter tells [place] that she is still having trouble with the pressure-relief mattress through medical modalities Electronic Signature(s) Signed: 01/28/2017 7:53:49 AM By: Baltazar Najjar MD Entered By: Baltazar Najjar on 01/27/2017 12:29:15 [name] ([id number]) -------------------------------------------------------------------------------- Physical Exam Details Patient Name: [name] Date of Service: 01/27/2017 10:15 AM Medical Record Patient Account Number: [id number] Number: Treating RN: Clover Mealy, RN, BSN, Rita September 14, 1923 (83 y.o. Other Clinician: Date of Birth/Sex: Female) Treating Rorey Hodges Primary Care Provider: Vonita Moss Provider/Extender: G Referring Provider: Vonita Moss Weeks in Treatment: 2 Constitutional Sitting or standing Blood Pressure is within target range for patient.. Pulse regular and within target range for patient.Marland Kitchen Respirations regular, non-labored and within target range.. Temperature is normal and within the target range for the patient.. Very frail woman but not in any distress. Respiratory . Notes Wound exam; the wound bed actually looks a lot better today. The circumference of the wound looks more viable however still the base of  the wound requires a aggressive debridement with a #5 curet removing necrotic surface material. Hemostasis with direct pressure she tolerates this reasonably well. The erythema around the wound bed from last week looks a lot better, her daughters been using Lotrimin AF home confirming that this was a candidal infection not cellulitis/bacterial cellulitis Electronic Signature(s) Signed: 01/28/2017 7:53:49 AM By: Baltazar Najjar MD Entered By: Baltazar Najjar on 01/27/2017 12:31:41 [name] ([id number]) -------------------------------------------------------------------------------- Physician Orders Details Patient Name: [name] Date of Service: 01/27/2017 10:15 AM Medical Record Patient Account Number: [id number] Number: Treating RN: Clover Mealy, RN, BSN, Rita 02/21/1923 (83 y.o. Other Clinician: Date of Birth/Sex: Female) Treating Dailey Alberson Primary Care Provider: Vonita Moss Provider/Extender: G Referring Provider: Vonita Moss Weeks in Treatment: 2 Verbal / Phone Orders: No Diagnosis Coding Wound Cleansing Wound #1 Midline Coccyx o Clean wound with Normal Saline. o May Shower, gently pat wound dry prior to applying new dressing. Anesthetic Wound #1 Midline Coccyx o Topical Lidocaine 4% cream applied to wound bed prior to debridement - in clinic Skin Barriers/Peri-Wound Care Wound #1 Midline Coccyx o Skin Prep Primary Wound Dressing Wound #1 Midline Coccyx o [name] Ointment Secondary Dressing Wound #1 Midline Coccyx o Dry Gauze o Boardered Foam Dressing - silicon bordered dressing due to skin breakdown Dressing Change Frequency Wound #1 Midline Coccyx o Change dressing every day. Follow-up Appointments Wound #1 Midline Coccyx o Return Appointment in 2 weeks. Off-Loading Wound #1 Midline Coccyx [name], [name]. ([id number]) o Roho cushion for wheelchair - [facility] please order roho cushion or gel cushion for  patients wheelchair - whichever she qualifies for o Turn and reposition every 2 hours Additional Orders / Instructions Wound #1 Midline Coccyx o Increase protein intake. - please add protein supplements to patients diet o Other: - please add vitamin A, vitamin C and zinc supplements to patients diet Home Health Wound #1 Midline Coccyx o Continue Home Health Visits - [place] o Home Health Nurse may visit PRN to address patientos wound care needs. o FACE TO FACE ENCOUNTER: MEDICARE and MEDICAID PATIENTS: I certify that this patient is under my care and that I had a face-to-face encounter that meets the physician face-to-face encounter requirements with this patient on this date. The encounter  with the patient was in whole or in part for the following MEDICAL CONDITION: (primary reason for Home Healthcare) MEDICAL NECESSITY: I certify, that based on my findings, NURSING services are a medically necessary home health service. HOME BOUND STATUS: I certify that my clinical findings support that this patient is homebound (i.e., Due to illness or injury, pt requires aid of supportive devices such as crutches, cane, wheelchairs, walkers, the use of special transportation or the assistance of another person to leave their place of residence. There is a normal inability to leave the home and doing so requires considerable and taxing effort. Other absences are for medical reasons / religious services and are infrequent or of short duration when for other reasons). o If current dressing causes regression in wound condition, may D/C ordered dressing product/s and apply Normal Saline Moist Dressing daily until next Wound Healing Center / Other MD appointment. Notify Wound Healing Center of regression in wound condition at [phone]. o Please direct any NON-WOUND related issues/requests for orders to patient's Primary Care Physician Medications-please add to medication list. Wound #1  Midline Coccyx o [name] Enzymatic Ointment Electronic Signature(s) Signed: 01/27/2017 4:25:14 PM By: Elpidio EricAfful, Rita BSN, RN Signed: 01/28/2017 7:53:49 AM By: Baltazar Najjarobson, Audi Conover MD Entered By: Elpidio EricAfful, Rita on 01/27/2017 11:08:14 Tina MainlandSHAW, [name] ([id number]) -------------------------------------------------------------------------------- Problem List Details Patient Name: Tina MainlandSHAW, [name] Date of Service: 01/27/2017 10:15 AM Medical Record Patient Account Number: [id number] Number: Treating RN: Clover MealyAfful, RN, BSN, Rita [birth year] (83 y.o. Other Clinician: Date of Birth/Sex: Female) Treating Betzaira Mentel Primary Care Provider: Vonita MossRISSMAN, MARK Provider/Extender: G Referring Provider: Vonita MossRISSMAN, MARK Weeks in Treatment: 2 Active Problems ICD-10 Encounter Code Description Active Date Diagnosis L89.150 Pressure ulcer of sacral region, unstageable 01/07/2017 Yes G20 Parkinson's disease 01/07/2017 Yes Inactive Problems Resolved Problems Electronic Signature(s) Signed: 01/28/2017 7:53:49 AM By: Baltazar Najjarobson, Jadaya Sommerfield MD Entered By: Baltazar Najjarobson, Kortne All on 01/27/2017 12:26:48 Tina MainlandSHAW, [name] ([id number]) -------------------------------------------------------------------------------- Progress Note Details Patient Name: Tina MainlandSHAW, [name] Date of Service: 01/27/2017 10:15 AM Medical Record Patient Account Number: [id number] Number: Treating RN: Clover MealyAfful, RN, BSN, Rita [birth year] (83 y.o. Other Clinician: Date of Birth/Sex: Female) Treating Rakesha Dalporto Primary Care Provider: Vonita MossRISSMAN, MARK Provider/Extender: G Referring Provider: Vonita MossRISSMAN, MARK Weeks in Treatment: 2 Subjective Chief Complaint Information obtained from Patient 01/07/17; patient is here for review of a sacral pressure ulcer History of Present Illness (HPI) 01/07/17 this is a 83 year old woman admitted to the clinic today for review of a pressure ulcer on her lower sacrum. She is referred from her primary physician's office  after being seen on 3/22 with a 3 cm pressure area. Her daughter and caretaker accompanied her today state that the area first became obvious about a month ago and his since deteriorated. They have recently got [name] a home health involved and have been using [name] to the wound. They have ordered a pressure relief surface for her mattress. They are turning her religiously. They state that she eats well and they've been forcing fluids on her. She is on a multivitamin. [facility] albumin I see was 4.4 on 10/28. The patient has advanced parkinsonism which looks superficially like advanced Parkinson's disease although her daughter tells me she did not ever respond to Sinemet therefore this may have another pathology with signs of parkinsonism. However I think this is largely a mute point currently. She also has dementia and is nonambulatory. Since this started they have been keeping her in bed and turning her religiously every 2 hours.  She lives at home in [place] with her husband with 24/7 care giving 01/13/17 santyl change qd. Still will require further debridement. continue santyl. 01/20/17; patient's wound actually looks some better less adherent necrotic surface. There is actually visible granulation. We're using [name] 01/27/17; better-looking surface but still a lot of necrotic tissue on the base of this wound. The periwound erythema is better than last week we are still using [name]. Her daughter tells [place] that she is still having trouble with the pressure-relief mattress through medical modalities Objective Constitutional Sitting or standing Blood Pressure is within target range for patient.. Pulse regular and within target range for patient.Marland Kitchen Respirations regular, non-labored and within target range.. Temperature is normal and within the target range for the patient.. Very frail woman but not in any distress. [name], [name] ([id number]) Vitals Time Taken:  10:36 AM, Temperature: 98 F, Pulse: 50 bpm, Respiratory Rate: 16 breaths/min, Blood Pressure: 133/62 mmHg. General Notes: Wound exam; the wound bed actually looks a lot better today. The circumference of the wound looks more viable however still the base of the wound requires a aggressive debridement with a #5 curet removing necrotic surface material. Hemostasis with direct pressure she tolerates this reasonably well. The erythema around the wound bed from last week looks a lot better, her daughters been using Lotrimin AF home confirming that this was a candidal infection not cellulitis/bacterial cellulitis Integumentary (Hair, Skin) Wound #1 status is Open. Original cause of wound was Pressure Injury. The wound is located on the Midline Coccyx. The wound measures 3.5cm length x 2.2cm width x 0.5cm depth; 6.048cm^2 area and 3.024cm^3 volume. There is Fat Layer (Subcutaneous Tissue) Exposed exposed. There is no tunneling or undermining noted. There is a large amount of serous drainage noted. The wound margin is flat and intact. There is medium (34-66%) granulation within the wound bed. There is a medium (34-66%) amount of necrotic tissue within the wound bed including Adherent Slough. The periwound skin appearance exhibited: Induration, Erythema. The periwound skin appearance did not exhibit: Callus, Crepitus, Excoriation, Rash, Scarring, Dry/Scaly, Maceration, Atrophie Blanche, Cyanosis, Ecchymosis, Hemosiderin Staining, Mottled, Pallor, Rubor. The surrounding wound skin color is noted with erythema which is circumferential. Periwound temperature was noted as No Abnormality. The periwound has tenderness on palpation. Assessment Active Problems ICD-10 L89.150 - Pressure ulcer of sacral region, unstageable G20 - Parkinson's disease Procedures Wound #1 Wound #1 is a Pressure Ulcer located on the Midline Coccyx . There was a Skin/Subcutaneous Tissue Debridement ([id number]) debridement with  total area of 7.7 sq cm performed by Maxwell Caul, MD. with the following instrument(s): Curette to remove Non-Viable tissue/material including Exudate, Fat Layer (and Subcutaneous Tissue) Exposed, Fibrin/Slough, and Subcutaneous after achieving pain control using Lidocaine 4% Topical Solution. A time out was conducted at 11:00, prior to the start of the procedure. A Moderate amount of bleeding was controlled with Pressure. The procedure was tolerated well with a pain level of 0 throughout and a pain level of 0 following the procedure. Post Debridement [name], [name] ([id number]) Measurements: 3.5cm length x 2.2cm width x 0.7cm depth; 4.233cm^3 volume. Post debridement Stage noted as Category/Stage III. Character of Wound/Ulcer Post Debridement requires further debridement. Severity of Tissue Post Debridement is: Fat layer exposed. Post procedure Diagnosis Wound #1: Same as Pre-Procedure Plan Wound Cleansing: Wound #1 Midline Coccyx: Clean wound with Normal Saline. May Shower, gently pat wound dry prior to applying new dressing. Anesthetic: Wound #1 Midline Coccyx: Topical Lidocaine 4% cream applied to wound bed  prior to debridement - in clinic Skin Barriers/Peri-Wound Care: Wound #1 Midline Coccyx: Skin Prep Primary Wound Dressing: Wound #1 Midline Coccyx: [name] Ointment Secondary Dressing: Wound #1 Midline Coccyx: Dry Gauze Boardered Foam Dressing - silicon bordered dressing due to skin breakdown Dressing Change Frequency: Wound #1 Midline Coccyx: Change dressing every day. Follow-up Appointments: Wound #1 Midline Coccyx: Return Appointment in 2 weeks. Off-Loading: Wound #1 Midline Coccyx: Roho cushion for wheelchair - [facility] please order roho cushion or gel cushion for patients wheelchair - whichever she qualifies for Turn and reposition every 2 hours Additional Orders / Instructions: Wound #1 Midline Coccyx: Increase protein intake. - please add protein  supplements to patients diet Other: - please add vitamin A, vitamin C and zinc supplements to patients diet Home Health: Wound #1 Midline Coccyx: Continue Home Health Visits - [facility] Nurse may visit PRN to address patient s wound care needs. FACE TO FACE ENCOUNTER: MEDICARE and MEDICAID PATIENTS: I certify that this patient is under [name], [name] ([id number]) my care and that I had a face-to-face encounter that meets the physician face-to-face encounter requirements with this patient on this date. The encounter with the patient was in whole or in part for the following MEDICAL CONDITION: (primary reason for Home Healthcare) MEDICAL NECESSITY: I certify, that based on my findings, NURSING services are a medically necessary home health service. HOME BOUND STATUS: I certify that my clinical findings support that this patient is homebound (i.e., Due to illness or injury, pt requires aid of supportive devices such as crutches, cane, wheelchairs, walkers, the use of special transportation or the assistance of another person to leave their place of residence. There is a normal inability to leave the home and doing so requires considerable and taxing effort. Other absences are for medical reasons / religious services and are infrequent or of short duration when for other reasons). If current dressing causes regression in wound condition, may D/C ordered dressing product/s and apply Normal Saline Moist Dressing daily until next Wound Healing Center / Other MD appointment. Notify Wound Healing Center of regression in wound condition at [phone]. Please direct any NON-WOUND related issues/requests for orders to patient's Primary Care Physician Medications-please add to medication list.: Wound #1 Midline Coccyx: [name] Enzymatic Ointment #1 we're going to continue with [name] to the wound bed order border foam, they're changing this every day at home Electronic Signature(s) Signed:  01/28/2017 7:53:49 AM By: Baltazar Najjar MD Entered By: Baltazar Najjar on 01/27/2017 12:32:22 [name] ([id number]) -------------------------------------------------------------------------------- SuperBill Details Patient Name: [name] Date of Service: 01/27/2017 Medical Record Patient Account Number: [id number] Number: Treating RN: Clover Mealy, RN, BSN, Rita 13-Nov-1922 (83 y.o. Other Clinician: Date of Birth/Sex: Female) Treating Livi Mcgann Primary Care Provider: Vonita Moss Provider/Extender: G Referring Provider: Vonita Moss Weeks in Treatment: 2 Diagnosis Coding ICD-10 Codes Code Description L89.150 Pressure ulcer of sacral region, unstageable G20 Parkinson's disease Facility Procedures CPT4 Code: [id number] Description: 11042 - DEB SUBQ TISSUE 20 SQ CM/< ICD-10 Description Diagnosis L89.150 Pressure ulcer of sacral region, unstageable Modifier: Quantity: 1 Physician Procedures CPT4 Code: [id number] Description: 11042 - WC PHYS SUBQ TISS 20 SQ CM ICD-10 Description Diagnosis L89.150 Pressure ulcer of sacral region, unstageable Modifier: Quantity: 1 Electronic Signature(s) Signed: 01/28/2017 7:53:49 AM By: Baltazar Najjar MD Entered By: Baltazar Najjar on 01/27/2017 12:32:34

## 2017-01-29 NOTE — Progress Notes (Signed)
[name], [name] ([id number]) Visit Report for 01/27/2017 Arrival Information Details Patient Name: [name], [name] Date of Service: 01/27/2017 10:15 AM Medical Record Patient Account Number: [id number] Number: Treating RN: Clover Mealy, RN, BSN, Rita 1923/07/29 [phone] y.o. Other Clinician: Date of Birth/Sex: Female) Treating ROBSON, MICHAEL Primary Care Keiry Kowal: Vonita Moss Sharissa Brierley/Extender: G Referring Jeimy Bickert: Vonita Moss Weeks in Treatment: 2 Visit Information History Since Last Visit All ordered tests and consults were No Patient Arrived: Wheel completed: Chair Added or deleted any medications: No Arrival Time: 10:35 Any new allergies or adverse No Accompanied By: dtr reactions: Transfer Assistance: Manual Had a fall or experienced change in No Patient Identification Verified: Yes activities of daily living that may Secondary Verification Process Yes affect Completed: risk of falls: Patient Requires Transmission-Based No Signs or symptoms of abuse/neglect No Precautions: since last visito Patient Has Alerts: No Hospitalized since last visit: No Has Dressing in Place as Yes Prescribed: Pain Present Now: Unable to Respond Electronic Signature(s) Signed: 01/27/2017 4:25:14 PM By: Elpidio Eric BSN, RN Entered By: Elpidio Eric on 01/27/2017 10:36:19 [name] ([id number]) -------------------------------------------------------------------------------- Encounter Discharge Information Details Patient Name: [name] Date of Service: 01/27/2017 10:15 AM Medical Record Patient Account Number: [id number] Number: Treating RN: Clover Mealy, RN, BSN, Rita 10/17/1922 (83 y.o. Other Clinician: Date of Birth/Sex: Female) Treating ROBSON, MICHAEL Primary Care Aadi Bordner: Vonita Moss Juno Alers/Extender: G Referring Maigen Mozingo: Vonita Moss Weeks in Treatment: 2 Encounter Discharge Information Items Discharge Pain Level: 0 Discharge Condition:  Stable Ambulatory Status: Wheelchair Discharge Destination: Home Transportation: Private Auto Accompanied By: dtr Schedule Follow-up Appointment: No Medication Reconciliation completed No and provided to Patient/Care Alton Tremblay: Provided on Clinical Summary of Care: 01/27/2017 Form Type Recipient Paper Patient ES Electronic Signature(s) Signed: 01/27/2017 11:46:12 AM By: Elpidio Eric BSN, RN Previous Signature: 01/27/2017 11:26:08 AM Version By: Gwenlyn Perking Entered By: Elpidio Eric on 01/27/2017 11:46:12 [name] ([id number]) -------------------------------------------------------------------------------- Lower Extremity Assessment Details Patient Name: [name] Date of Service: 01/27/2017 10:15 AM Medical Record Patient Account Number: [id number] Number: Treating RN: Clover Mealy, RN, BSN, Rita Oct 24, 1922 (83 y.o. Other Clinician: Date of Birth/Sex: Female) Treating ROBSON, MICHAEL Primary Care Aadit Hagood: Vonita Moss Crytal Pensinger/Extender: G Referring Luz Burcher: Vonita Moss Weeks in Treatment: 2 Electronic Signature(s) Signed: 01/27/2017 4:25:14 PM By: Elpidio Eric BSN, RN Entered By: Elpidio Eric on 01/27/2017 10:45:43 [name] ([id number]) -------------------------------------------------------------------------------- Multi Wound Chart Details Patient Name: [name] Date of Service: 01/27/2017 10:15 AM Medical Record Patient Account Number: [id number] Number: Treating RN: Clover Mealy, RN, BSN, Rita March 17, 1923 (83 y.o. Other Clinician: Date of Birth/Sex: Female) Treating ROBSON, MICHAEL Primary Care Beaulah Romanek: Vonita Moss Ariea Rochin/Extender: G Referring Daelen Belvedere: Vonita Moss Weeks in Treatment: 2 Vital Signs Height(in): Pulse(bpm): 50 Weight(lbs): Blood Pressure 133/62 (mmHg): Body Mass Index(BMI): Temperature(F): 98 Respiratory Rate 16 (breaths/min): Photos: [1:No Photos] [N/A:N/A] Wound Location: [1:Coccyx - Midline]  [N/A:N/A] Wounding Event: [1:Pressure Injury] [N/A:N/A] Primary Etiology: [1:Pressure Ulcer] [N/A:N/A] Comorbid History: [1:Anemia, Hypertension, Dementia] [N/A:N/A] Date Acquired: [1:12/01/2016] [N/A:N/A] Weeks of Treatment: [1:2] [N/A:N/A] Wound Status: [1:Open] [N/A:N/A] Measurements L x W x D 3.5x2.2x0.5 [N/A:N/A] (cm) Area (cm) : [1:6.048] [N/A:N/A] Volume (cm) : [1:3.024] [N/A:N/A] % Reduction in Area: [1:-45.60%] [N/A:N/A] % Reduction in Volume: -81.90% [N/A:N/A] Classification: [1:Category/Stage III] [N/A:N/A] Exudate Amount: [1:Large] [N/A:N/A] Exudate Type: [1:Serous] [N/A:N/A] Exudate Color: [1:amber] [N/A:N/A] Wound Margin: [1:Flat and Intact] [N/A:N/A] Granulation Amount: [1:Medium (34-66%)] [N/A:N/A] Necrotic Amount: [1:Medium (34-66%)] [N/A:N/A] Exposed Structures: [1:Fat Layer (Subcutaneous Tissue) Exposed: Yes Fascia: No Tendon: No Muscle: No] [N/A:N/A] Joint:  No Bone: No Epithelialization: None N/A N/A Debridement: Debridement ([id number]- N/A N/A 11047) Pre-procedure 11:00 N/A N/A Verification/Time Out Taken: Pain Control: Lidocaine 4% Topical N/A N/A Solution Tissue Debrided: Fibrin/Slough, Fat, N/A N/A Exudates, Subcutaneous Level: Skin/Subcutaneous N/A N/A Tissue Debridement Area (sq 7.7 N/A N/A cm): Instrument: Curette N/A N/A Bleeding: Moderate N/A N/A Hemostasis Achieved: Pressure N/A N/A Procedural Pain: 0 N/A N/A Post Procedural Pain: 0 N/A N/A Debridement Treatment Procedure was tolerated N/A N/A Response: well Post Debridement 3.5x2.2x0.7 N/A N/A Measurements L x W x D (cm) Post Debridement 4.233 N/A N/A Volume: (cm) Post Debridement Category/Stage III N/A N/A Stage: Periwound Skin Texture: Induration: Yes N/A N/A Excoriation: No Callus: No Crepitus: No Rash: No Scarring: No Periwound Skin Maceration: No N/A N/A Moisture: Dry/Scaly: No Periwound Skin Color: Erythema: Yes N/A N/A Atrophie Blanche: No Cyanosis: No Ecchymosis:  No Hemosiderin Staining: No Mottled: No Pallor: No Rubor: No Erythema Location: Circumferential N/A N/A Temperature: No Abnormality N/A N/A Tenderness on Yes N/A N/A Palpation: [name], [name] ([id number]) Wound Preparation: Ulcer Cleansing: N/A N/A Rinsed/Irrigated with Saline Topical Anesthetic Applied: Other: lidocaine 4% Procedures Performed: Debridement N/A N/A Treatment Notes Wound #1 (Midline Coccyx) 1. Cleansed with: Clean wound with Normal Saline 3. Peri-wound Care: Skin Prep 4. Dressing Applied: [name] Ointment 5. Secondary Dressing Applied Bordered Foam Dressing Dry Gauze Electronic Signature(s) Signed: 01/28/2017 7:53:49 AM By: Baltazar Najjar MD Entered By: Baltazar Najjar on 01/27/2017 12:26:55 [name] ([id number]) -------------------------------------------------------------------------------- Multi-Disciplinary Care Plan Details Patient Name: [name] Date of Service: 01/27/2017 10:15 AM Medical Record Patient Account Number: [id number] Number: Treating RN: Clover Mealy, RN, BSN, Rita 08-29-1923 [phone] y.o. Other Clinician: Date of Birth/Sex: Female) Treating ROBSON, MICHAEL Primary Care Colonel Krauser: Vonita Moss Naryiah Schley/Extender: G Referring Dnya Hickle: Vonita Moss Weeks in Treatment: 2 Active Inactive ` Abuse / Safety / Falls / Self Care Management Nursing Diagnoses: Impaired physical mobility Potential for falls Goals: Patient will remain injury free Date Initiated: 01/07/2017 Target Resolution Date: 04/03/2017 Goal Status: Active Interventions: Assess fall risk on admission and as needed Notes: ` Nutrition Nursing Diagnoses: Potential for alteratiion in Nutrition/Potential for imbalanced nutrition Goals: Patient/caregiver agrees to and verbalizes understanding of need to use nutritional supplements and/or vitamins as prescribed Date Initiated: 01/07/2017 Target Resolution Date: 04/03/2017 Goal Status:  Active Interventions: Assess patient nutrition upon admission and as needed per policy Notes: ` Orientation to the Wound Care Program [name], [name] ([id number]) Nursing Diagnoses: Knowledge deficit related to the wound healing center program Goals: Patient/caregiver will verbalize understanding of the Wound Healing Center Program Date Initiated: 01/07/2017 Target Resolution Date: 04/03/2017 Goal Status: Active Interventions: Provide education on orientation to the wound center Notes: ` Pressure Nursing Diagnoses: Knowledge deficit related to causes and risk factors for pressure ulcer development Goals: Patient will remain free from development of additional pressure ulcers Date Initiated: 01/07/2017 Target Resolution Date: 04/03/2017 Goal Status: Active Interventions: Assess potential for pressure ulcer upon admission and as needed Notes: ` Wound/Skin Impairment Nursing Diagnoses: Impaired tissue integrity Goals: Patient/caregiver will verbalize understanding of skin care regimen Date Initiated: 01/07/2017 Target Resolution Date: 04/03/2017 Goal Status: Active Ulcer/skin breakdown will have a volume reduction of 30% by week 4 Date Initiated: 01/07/2017 Target Resolution Date: 04/03/2017 Goal Status: Active Ulcer/skin breakdown will have a volume reduction of 50% by week 8 Date Initiated: 01/07/2017 Target Resolution Date: 04/03/2017 Goal Status: Active Ulcer/skin breakdown will have a volume reduction of 80% by week 12 [name], [name] ([id number]) Date Initiated: 01/07/2017 Target Resolution  Date: 04/03/2017 Goal Status: Active Ulcer/skin breakdown will heal within 14 weeks Date Initiated: 01/07/2017 Target Resolution Date: 04/03/2017 Goal Status: Active Interventions: Assess patient/caregiver ability to obtain necessary supplies Assess patient/caregiver ability to perform ulcer/skin care regimen upon admission and as needed Assess ulceration(s) every  visit Notes: Electronic Signature(s) Signed: 01/27/2017 4:25:14 PM By: Elpidio EricAfful, Rita BSN, RN Entered By: Elpidio EricAfful, Rita on 01/27/2017 10:47:13 [name], [name] ([id number]) -------------------------------------------------------------------------------- Pain Assessment Details Patient Name: [name], Anglea W. Date of Service: 01/27/2017 10:15 AM Medical Record Patient Account Number: [id number] Number: Treating RN: Clover MealyAfful, RN, BSN, Rita 1923/02/26 (83 y.o. Other Clinician: Date of Birth/Sex: Female) Treating ROBSON, MICHAEL Primary Care Christos Mixson: Vonita MossRISSMAN, MARK Laterria Lasota/Extender: G Referring Gaberiel Youngblood: Vonita MossRISSMAN, MARK Weeks in Treatment: 2 Active Problems Location of Pain Severity and Description of Pain Patient Has Paino Patient Unable to Respond Site Locations Pain Management and Medication Current Pain Management: Electronic Signature(s) Signed: 01/27/2017 4:25:14 PM By: Elpidio EricAfful, Rita BSN, RN Entered By: Elpidio EricAfful, Rita on 01/27/2017 10:36:28 [name], [name] ([id number]) -------------------------------------------------------------------------------- Patient/Caregiver Education Details Patient Name: [name], [name] Date of Service: 01/27/2017 10:15 AM Medical Record Patient Account Number: [id number] Number: Treating RN: Clover MealyAfful, RN, BSN, Rita 1923/02/26 (83 y.o. Other Clinician: Date of Birth/Gender: Female) Treating ROBSON, MICHAEL Primary Care Physician: Vonita MossRISSMAN, MARK Physician/Extender: G Referring Physician: Veverly FellsRISSMAN, MARK Weeks in Treatment: 2 Education Assessment Education Provided To: Patient and Caregiver dtr/HH Education Topics Provided Welcome To The Wound Care Center: Methods: Explain/Verbal Responses: State content correctly Wound Debridement: Methods: Explain/Verbal Responses: State content correctly Wound/Skin Impairment: Methods: Explain/Verbal Responses: State content correctly Electronic Signature(s) Signed: 01/27/2017 4:25:14 PM By: Elpidio EricAfful,  Rita BSN, RN Entered By: Elpidio EricAfful, Rita on 01/27/2017 11:46:58 [name], Leia W. ([id number]) -------------------------------------------------------------------------------- Wound Assessment Details Patient Name: [name], Alliah W. Date of Service: 01/27/2017 10:15 AM Medical Record Patient Account Number: [id number] Number: Treating RN: Clover MealyAfful, RN, BSN, Rita 1923/02/26 (83 y.o. Other Clinician: Date of Birth/Sex: Female) Treating ROBSON, MICHAEL Primary Care Nakesha Ebrahim: Vonita MossRISSMAN, MARK Annabell Oconnor/Extender: G Referring Marlyn Tondreau: Vonita MossRISSMAN, MARK Weeks in Treatment: 2 Wound Status Wound Number: 1 Primary Etiology: Pressure Ulcer Wound Location: Coccyx - Midline Wound Status: Open Wounding Event: Pressure Injury Comorbid History: Anemia, Hypertension, Dementia Date Acquired: 12/01/2016 Weeks Of Treatment: 2 Clustered Wound: No Photos Photo Uploaded By: Elpidio EricAfful, Rita on 01/27/2017 16:32:02 Wound Measurements Length: (cm) 3.5 Width: (cm) 2.2 Depth: (cm) 0.5 Area: (cm) 6.048 Volume: (cm) 3.024 % Reduction in Area: -45.6% % Reduction in Volume: -81.9% Epithelialization: None Tunneling: No Undermining: No Wound Description Classification: Category/Stage III Wound Margin: Flat and Intact Exudate Amount: Large Exudate Type: Serous Exudate Color: amber Foul Odor After Cleansing: No Slough/Fibrino Yes Wound Bed Granulation Amount: Medium (34-66%) Exposed Structure Necrotic Amount: Medium (34-66%) Fascia Exposed: No Necrotic Quality: Adherent Slough Fat Layer (Subcutaneous Tissue) Exposed: Yes [name], Crissy W. ([id number]) Tendon Exposed: No Muscle Exposed: No Joint Exposed: No Bone Exposed: No Periwound Skin Texture Texture Color No Abnormalities Noted: No No Abnormalities Noted: No Callus: No Atrophie Blanche: No Crepitus: No Cyanosis: No Excoriation: No Ecchymosis: No Induration: Yes Erythema: Yes Rash: No Erythema Location: Circumferential Scarring:  No Hemosiderin Staining: No Mottled: No Moisture Pallor: No No Abnormalities Noted: No Rubor: No Dry / Scaly: No Maceration: No Temperature / Pain Temperature: No Abnormality Tenderness on Palpation: Yes Wound Preparation Ulcer Cleansing: Rinsed/Irrigated with Saline Topical Anesthetic Applied: Other: lidocaine 4%, Treatment Notes Wound #1 (Midline Coccyx) 1. Cleansed with: Clean wound with Normal Saline 3. Peri-wound Care: Skin Prep 4. Dressing Applied: [name] Ointment  5. Secondary Dressing Applied Bordered Foam Dressing Dry Gauze Electronic Signature(s) Signed: 01/27/2017 4:25:14 PM By: Elpidio Eric BSN, RN Entered By: Elpidio Eric on 01/27/2017 10:42:56 [name] ([id number]) -------------------------------------------------------------------------------- Vitals Details Patient Name: [name] Date of Service: 01/27/2017 10:15 AM Medical Record Patient Account Number: [id number] Number: Treating RN: Clover Mealy, RN, BSN, Rita 1923/03/16 (83 y.o. Other Clinician: Date of Birth/Sex: Female) Treating ROBSON, MICHAEL Primary Care Marlowe Lawes: Vonita Moss Caetano Oberhaus/Extender: G Referring Vladislav Axelson: Vonita Moss Weeks in Treatment: 2 Vital Signs Time Taken: 10:36 Temperature (F): 98 Pulse (bpm): 50 Respiratory Rate (breaths/min): 16 Blood Pressure (mmHg): 133/62 Reference Range: 80 - 120 mg / dl Electronic Signature(s) Signed: 01/27/2017 4:25:14 PM By: Elpidio Eric BSN, RN Entered By: Elpidio Eric on 01/27/2017 10:47:04

## 2017-02-03 ENCOUNTER — Encounter: Payer: Medicare Other | Admitting: Internal Medicine

## 2017-02-03 DIAGNOSIS — L89150 Pressure ulcer of sacral region, unstageable: Secondary | ICD-10-CM | POA: Diagnosis not present

## 2017-02-04 ENCOUNTER — Telehealth: Payer: Self-pay | Admitting: Family Medicine

## 2017-02-04 DIAGNOSIS — R399 Unspecified symptoms and signs involving the genitourinary system: Secondary | ICD-10-CM

## 2017-02-04 NOTE — Telephone Encounter (Signed)
ua ok  

## 2017-02-04 NOTE — Progress Notes (Addendum)
[name] ([id number]) Visit Report for 02/03/2017 Arrival Information Details Patient Name: [name], [name] Date of Service: 02/03/2017 2:30 PM Medical Record Patient Account Number: [id number] Number: Treating RN: [name] 1923-06-02 (83 y.o. Other Clinician: Date of Birth/Sex: Female) Treating [name] Primary Care [name]: [name] [name]: G Referring [name]: [name] Weeks in Treatment: 3 Visit Information History Since Last Visit Added or deleted any medications: No Patient Arrived: Wheel Chair Any new allergies or adverse No reactions: Arrival Time: 15:00 Had a fall or experienced change in No Accompanied By: dtr and cg activities of daily living that may Transfer Assistance: Manual affect Patient Identification Verified: Yes risk of falls: Secondary Verification Process Yes Signs or symptoms of abuse/neglect No Completed: since last visito Patient Requires Transmission-Based No Hospitalized since last visit: No Precautions: Has Dressing in Place as Yes Patient Has Alerts: No Prescribed: Pain Present Now: Unable to Respond Electronic Signature(s) Signed: 02/03/2017 4:56:51 PM By: [name] Entered By: [name] on 02/03/2017 15:00:30 [name] ([id number]) -------------------------------------------------------------------------------- Clinic Level of Care Assessment Details Patient Name: [name] Date of Service: 02/03/2017 2:30 PM Medical Record Patient Account Number: [id number] Number: Treating RN: [name] 1923-09-18 (83 y.o. Other Clinician: Date of Birth/Sex: Female) Treating [name] Primary Care Tadeusz Stahl: [name] [name]/Extender: G Referring [name]: [name] Weeks in Treatment: 3 Clinic Level of Care Assessment Items TOOL 4 Quantity Score []  - Use when only an EandM is performed on FOLLOW-UP visit 0 ASSESSMENTS - Nursing  Assessment / Reassessment X - Reassessment of Co-morbidities (includes updates in patient status) 1 10 X - Reassessment of Adherence to Treatment Plan 1 5 ASSESSMENTS - Wound and Skin Assessment / Reassessment X - Simple Wound Assessment / Reassessment - one wound 1 5 []  - Complex Wound Assessment / Reassessment - multiple wounds 0 []  - Dermatologic / Skin Assessment (not related to wound area) 0 ASSESSMENTS - Focused Assessment []  - Circumferential Edema Measurements - multi extremities 0 []  - Nutritional Assessment / Counseling / Intervention 0 []  - Lower Extremity Assessment (monofilament, tuning fork, pulses) 0 []  - Peripheral Arterial Disease Assessment (using hand held doppler) 0 ASSESSMENTS - Ostomy and/or Continence Assessment and Care []  - Incontinence Assessment and Management 0 []  - Ostomy Care Assessment and Management (repouching, etc.) 0 PROCESS - Coordination of Care X - Simple Patient / Family Education for ongoing care 1 15 []  - Complex (extensive) Patient / Family Education for ongoing care 0 []  - Haematologist obtains Chiropractor, Records, Test Results / Process Orders 0 []  - Staff telephones HHA, Nursing Homes / Clarify orders / etc 0 [name], [name] ([id number]) []  - Routine Transfer to another Facility (non-emergent condition) 0 []  - Routine Hospital Admission (non-emergent condition) 0 []  - New Admissions / Manufacturing engineer / Ordering NPWT, Apligraf, etc. 0 []  - Emergency Hospital Admission (emergent condition) 0 X - Simple Discharge Coordination 1 10 []  - Complex (extensive) Discharge Coordination 0 PROCESS - Special Needs []  - Pediatric / Minor Patient Management 0 []  - Isolation Patient Management 0 []  - Hearing / Language / Visual special needs 0 []  - Assessment of Community assistance (transportation, D/C planning, etc.) 0 X - Additional assistance / Altered mentation 1 15 []  - Support Surface(s) Assessment (bed, cushion, seat, etc.) 0 INTERVENTIONS - Wound  Cleansing / Measurement X - Simple Wound Cleansing - one wound 1 5 []  - Complex Wound Cleansing - multiple wounds 0 X - Wound Imaging (photographs - any  number of wounds) 1 5 []  - Wound Tracing (instead of photographs) 0 X - Simple Wound Measurement - one wound 1 5 []  - Complex Wound Measurement - multiple wounds 0 INTERVENTIONS - Wound Dressings X - Small Wound Dressing one or multiple wounds 1 10 []  - Medium Wound Dressing one or multiple wounds 0 []  - Large Wound Dressing one or multiple wounds 0 X - Application of Medications - topical 1 5 []  - Application of Medications - injection 0 [name]. ([id number]) INTERVENTIONS - Miscellaneous []  - External ear exam 0 []  - Specimen Collection (cultures, biopsies, blood, body fluids, etc.) 0 []  - Specimen(s) / Culture(s) sent or taken to Lab for analysis 0 []  - Patient Transfer (multiple staff / Michiel [name] Lift / Similar devices) 0 []  - Simple Staple / Suture removal (25 or less) 0 []  - Complex Staple / Suture removal (26 or more) 0 []  - Hypo / Hyperglycemic Management (close monitor of Blood Glucose) 0 []  - Ankle / Brachial Index (ABI) - do not check if billed separately 0 X - Vital Signs 1 5 Has the patient been seen at the hospital within the last three years: Yes Total Score: 95 Level Of Care: New/Established - Level 3 Electronic Signature(s) Signed: 02/03/2017 4:56:51 PM By: [name] Entered By: [name] on 02/03/2017 16:23:27 [name] ([id number]) -------------------------------------------------------------------------------- Encounter Discharge Information Details Patient Name: [name] Date of Service: 02/03/2017 2:30 PM Medical Record Patient Account Number: [id number] Number: Treating RN: [name] [birth year] (83 y.o. Other Clinician: Date of Birth/Sex: Female) Treating [name] Primary Care Jemimah Cressy: [name] Kynleigh Artz/Extender: G Referring Jamal Pavon: [name] Weeks in Treatment: 3 Encounter Discharge Information Items Discharge Pain Level: 0 Discharge Condition: Stable Ambulatory Status: Wheelchair Discharge Destination: Home Transportation: Private Auto Accompanied By: dtr and cg Schedule Follow-up Appointment: Yes Medication Reconciliation completed No and provided to Patient/Care Nevaya Nagele: Provided on Clinical Summary of Care: 02/03/2017 Form Type Recipient Paper Patient ES Electronic Signature(s) Signed: 02/03/2017 3:30:08 PM By: Gwenlyn Perking Entered By: Gwenlyn Perking on 02/03/2017 15:30:07 [name] ([id number]) -------------------------------------------------------------------------------- Multi Wound Chart Details Patient Name: [name] Date of Service: 02/03/2017 2:30 PM Medical Record Patient Account Number: [id number] Number: Treating RN: [name] 26-Apr-1923 (83 y.o. Other Clinician: Date of Birth/Sex: Female) Treating [name] Primary Care Andraya Frigon: [name] Sheria Rosello/Extender: G Referring Dymir Neeson: [name] Weeks in Treatment: 3 Vital Signs Height(in): Pulse(bpm): 54 Weight(lbs): Blood Pressure 92/54 (mmHg): Body Mass Index(BMI): Temperature(F): 97.8 Respiratory Rate 16 (breaths/min): Photos: [[name]/A:[name]/A] Wound Location: Coccyx - Midline [name]/A [name]/A Wounding Event: Pressure Injury [name]/A [name]/A Primary Etiology: Pressure Ulcer [name]/A [name]/A Comorbid History: Anemia, Hypertension, [name]/A [name]/A Dementia Date Acquired: 12/01/2016 [name]/A [name]/A Weeks of Treatment: 3 [name]/A [name]/A Wound Status: Open [name]/A [name]/A Measurements L x W x D 3.1x3x0.5 [name]/A [name]/A (cm) Area (cm) : 7.304 [name]/A [name]/A Volume (cm) : 3.652 [name]/A [name]/A % Reduction in Area: -75.80% [name]/A [name]/A % Reduction in Volume: -119.70% [name]/A [name]/A Classification: Category/Stage III [name]/A [name]/A Exudate Amount: Large [name]/A [name]/A Exudate Type: Serous [name]/A [name]/A Exudate Color: amber [name]/A [name]/A Wound Margin: Flat and Intact [name]/A [name]/A Granulation Amount: Medium  (34-66%) [name]/A [name]/A Necrotic Amount: Medium (34-66%) [name]/A [name]/A [name], CASTIGLIA. ([id number]) Exposed Structures: Fat Layer (Subcutaneous [name]/A [name]/A Tissue) Exposed: Yes Fascia: No Tendon: No Muscle: No Joint: No Bone: No Epithelialization: None [name]/A [name]/A Periwound Skin Texture: Induration: Yes [name]/A [name]/A Excoriation: No Callus: No Crepitus: No Rash: No Scarring: No Periwound  Skin Maceration: No [name]/A [name]/A Moisture: Dry/Scaly: No Periwound Skin Color: Erythema: Yes [name]/A [name]/A Atrophie Blanche: No Cyanosis: No Ecchymosis: No Hemosiderin Staining: No Mottled: No Pallor: No Rubor: No Erythema Location: Circumferential [name]/A [name]/A Temperature: No Abnormality [name]/A [name]/A Tenderness on Yes [name]/A [name]/A Palpation: Wound Preparation: Ulcer Cleansing: [name]/A [name]/A Rinsed/Irrigated with Saline Topical Anesthetic Applied: Other: lidocaine 4% Treatment Notes Wound #1 (Midline Coccyx) 1. Cleansed with: Clean wound with Normal Saline 2. Anesthetic Topical Lidocaine 4% cream to wound bed prior to debridement 3. Peri-wound Care: Barrier cream 4. Dressing Applied: [name] Ointment 5. Secondary Dressing Applied Bordered Foam Dressing Dry Gauze [name], [name] ([id number]) Electronic Signature(s) Signed: 02/04/2017 12:30:45 PM By: Baltazar Najjarobson, Michael MD Previous Signature: 02/03/2017 4:56:51 PM Version By: Tina Sitesorthy, Joanna Entered By: Baltazar Najjarobson, [name] on 02/04/2017 05:12:06 [name], [name] ([id number]) -------------------------------------------------------------------------------- Multi-Disciplinary Care Plan Details Patient Name: [name], [name] Date of Service: 02/03/2017 2:30 PM Medical Record Patient Account Number: [id number] Number: Treating RN: Tina SitesDorthy, Joanna 1923/04/25 (83 y.o. Other Clinician: Date of Birth/Sex: Female) Treating [name] Primary Care Decari Duggar: Tina MossRISSMAN, MARK Trine Fread/Extender: G Referring Liahna Brickner: Tina MossRISSMAN, MARK Weeks in Treatment: 3 Active Inactive ` Abuse / Safety  / Falls / Self Care Management Nursing Diagnoses: Impaired physical mobility Potential for falls Goals: Patient will remain injury free Date Initiated: 01/07/2017 Target Resolution Date: 04/03/2017 Goal Status: Active Interventions: Assess fall risk on admission and as needed Notes: ` Nutrition Nursing Diagnoses: Potential for alteratiion in Nutrition/Potential for imbalanced nutrition Goals: Patient/caregiver agrees to and verbalizes understanding of need to use nutritional supplements and/or vitamins as prescribed Date Initiated: 01/07/2017 Target Resolution Date: 04/03/2017 Goal Status: Active Interventions: Assess patient nutrition upon admission and as needed per policy Notes: ` Orientation to the Wound Care Program [name], [name] ([id number]) Nursing Diagnoses: Knowledge deficit related to the wound healing center program Goals: Patient/caregiver will verbalize understanding of the Wound Healing Center Program Date Initiated: 01/07/2017 Target Resolution Date: 04/03/2017 Goal Status: Active Interventions: Provide education on orientation to the wound center Notes: ` Pressure Nursing Diagnoses: Knowledge deficit related to causes and risk factors for pressure ulcer development Goals: Patient will remain free from development of additional pressure ulcers Date Initiated: 01/07/2017 Target Resolution Date: 04/03/2017 Goal Status: Active Interventions: Assess potential for pressure ulcer upon admission and as needed Notes: ` Wound/Skin Impairment Nursing Diagnoses: Impaired tissue integrity Goals: Patient/caregiver will verbalize understanding of skin care regimen Date Initiated: 01/07/2017 Target Resolution Date: 04/03/2017 Goal Status: Active Ulcer/skin breakdown will have a volume reduction of 30% by week 4 Date Initiated: 01/07/2017 Target Resolution Date: 04/03/2017 Goal Status: Active Ulcer/skin breakdown will have a volume reduction of 50% by week 8 Date  Initiated: 01/07/2017 Target Resolution Date: 04/03/2017 Goal Status: Active Ulcer/skin breakdown will have a volume reduction of 80% by week 12 [name], [name] ([id number]) Date Initiated: 01/07/2017 Target Resolution Date: 04/03/2017 Goal Status: Active Ulcer/skin breakdown will heal within 14 weeks Date Initiated: 01/07/2017 Target Resolution Date: 04/03/2017 Goal Status: Active Interventions: Assess patient/caregiver ability to obtain necessary supplies Assess patient/caregiver ability to perform ulcer/skin care regimen upon admission and as needed Assess ulceration(s) every visit Notes: Electronic Signature(s) Signed: 02/03/2017 4:56:51 PM By: Tina Sitesorthy, Joanna Entered By: Tina Sitesorthy, Joanna on 02/03/2017 15:08:00 [name], [name] ([id number]) -------------------------------------------------------------------------------- Pain Assessment Details Patient Name: [name], Nandi W. Date of Service: 02/03/2017 2:30 PM Medical Record Patient Account Number: [id number] Number: Treating RN: Tina Sitesorthy, Joanna 1923/04/25 (83 y.o. Other Clinician: Date of Birth/Sex: Female) Treating [name] Primary Care Alekai Pocock: Dossie ArbourRISSMAN,  MARK Nanea Jared/Extender: G Referring Bart Ashford: [name] Weeks in Treatment: 3 Active Problems Location of Pain Severity and Description of Pain Patient Has Paino Patient Unable to Respond Site Locations Pain Management and Medication Current Pain Management: Electronic Signature(s) Signed: 02/03/2017 4:56:51 PM By: [name] Entered By: [name] on 02/03/2017 15:01:05 [name] ([id number]) -------------------------------------------------------------------------------- Patient/Caregiver Education Details Patient Name: [name] Date of Service: 02/03/2017 2:30 PM Medical Record Patient Account Number: [id number] Number: Treating RN: [name] Jul 26, 1923 (83 y.o. Other Clinician: Date of Birth/Gender: Female)  Treating [name] Primary Care Physician: [name] Physician/Extender: G Referring Physician: Veverly Fells in Treatment: 3 Education Assessment Education Provided To: Caregiver Education Topics Provided Pressure: Handouts: Preventing Pressure Ulcers Methods: Explain/Verbal Responses: State content correctly Electronic Signature(s) Signed: 02/03/2017 4:56:51 PM By: [name] Entered By: [name] on 02/03/2017 15:12:34 [name] ([id number]) -------------------------------------------------------------------------------- Wound Assessment Details Patient Name: [name] Date of Service: 02/03/2017 2:30 PM Medical Record Patient Account Number: [id number] Number: Treating RN: [name] 22-Dec-1922 (83 y.o. Other Clinician: Date of Birth/Sex: Female) Treating [name] Primary Care Jaking Thayer: [name] Xianna Siverling/Extender: G Referring Wyndham Santilli: [name] Weeks in Treatment: 3 Wound Status Wound Number: 1 Primary Etiology: Pressure Ulcer Wound Location: Coccyx - Midline Wound Status: Open Wounding Event: Pressure Injury Comorbid History: Anemia, Hypertension, Dementia Date Acquired: 12/01/2016 Weeks Of Treatment: 3 Clustered Wound: No Photos Photo Uploaded By: [name] on 02/03/2017 16:52:31 Wound Measurements Length: (cm) 3.1 Width: (cm) 3 Depth: (cm) 0.5 Area: (cm) 7.304 Volume: (cm) 3.652 % Reduction in Area: -75.8% % Reduction in Volume: -119.7% Epithelialization: None Tunneling: No Undermining: No Wound Description Classification: Category/Stage III Wound Margin: Flat and Intact Exudate Amount: Large Exudate Type: Serous Exudate Color: amber Foul Odor After Cleansing: No Slough/Fibrino Yes Wound Bed Granulation Amount: Medium (34-66%) Exposed Structure Necrotic Amount: Medium (34-66%) Fascia Exposed: No Necrotic Quality: Adherent Slough Fat Layer (Subcutaneous Tissue)  Exposed: Yes LYRIKA, SOUDERS ([id number]) Tendon Exposed: No Muscle Exposed: No Joint Exposed: No Bone Exposed: No Periwound Skin Texture Texture Color No Abnormalities Noted: No No Abnormalities Noted: No Callus: No Atrophie Blanche: No Crepitus: No Cyanosis: No Excoriation: No Ecchymosis: No Induration: Yes Erythema: Yes Rash: No Erythema Location: Circumferential Scarring: No Hemosiderin Staining: No Mottled: No Moisture Pallor: No No Abnormalities Noted: No Rubor: No Dry / Scaly: No Maceration: No Temperature / Pain Temperature: No Abnormality Tenderness on Palpation: Yes Wound Preparation Ulcer Cleansing: Rinsed/Irrigated with Saline Topical Anesthetic Applied: Other: lidocaine 4%, Treatment Notes Wound #1 (Midline Coccyx) 1. Cleansed with: Clean wound with Normal Saline 2. Anesthetic Topical Lidocaine 4% cream to wound bed prior to debridement 3. Peri-wound Care: Barrier cream 4. Dressing Applied: [name] Ointment 5. Secondary Dressing Applied Bordered Foam Dressing Dry Gauze Electronic Signature(s) Signed: 02/03/2017 4:56:51 PM By: [name] Entered By: [name] on 02/03/2017 15:07:46 [name] ([id number]) -------------------------------------------------------------------------------- Vitals Details Patient Name: [name] Date of Service: 02/03/2017 2:30 PM Medical Record Patient Account Number: [id number] Number: Treating RN: [name] [birth year] (83 y.o. Other Clinician: Date of Birth/Sex: Female) Treating [name] Primary Care Aubery Douthat: [name] Briarrose Shor/Extender: G Referring Keiondre Colee: [name] Weeks in Treatment: 3 Vital Signs Time Taken: 15:01 Temperature (F): 97.8 Pulse (bpm): 54 Respiratory Rate (breaths/min): 16 Blood Pressure (mmHg): 92/54 Reference Range: 80 - 120 mg / dl Electronic Signature(s) Signed: 02/03/2017 4:56:51 PM By: [name] Entered By: [name] on 02/03/2017 15:01:23

## 2017-02-04 NOTE — Telephone Encounter (Signed)
Patients caregiver thinks she may have a UTI.  They are going to drop off a specimen as soon as they can obtain it hopefully this afternoon or by morning.  Thanks  [employer]uc[employer] Corporationenee [id number]

## 2017-02-04 NOTE — Telephone Encounter (Signed)
Okay to place UA order? Please advise.

## 2017-02-05 ENCOUNTER — Telehealth: Payer: Self-pay | Admitting: Family Medicine

## 2017-02-05 NOTE — Telephone Encounter (Signed)
[facility] in regards to patient needing an order for two more weekly nurse visits. Tina CavesJessie also called to inform the provider that during the last nurse visit patients Blood Pressure was elevated after taking medication.   Please Advise   Maple HudsonJessie ([facility] Nurse): [phone]  Thank you

## 2017-02-05 NOTE — Telephone Encounter (Signed)
ok 

## 2017-02-05 NOTE — Progress Notes (Signed)
[name], [name] ([id number]) Visit Report for 02/03/2017 HPI Details Patient Name: [name], [name] Date of Service: 02/03/2017 2:30 PM Medical Record Patient Account Number: [id number] Number: Treating RN: [name] 1922-10-23 (83 y.o. Other Clinician: Date of Birth/Sex: Female) Treating [name] Primary Care Provider: Vonita MossRISSMAN, [name] Provider/Extender: G Referring Provider: Vonita MossRISSMAN, [name] Weeks in Treatment: 3 History of Present Illness HPI Description: 01/07/17 this is a 83 year old woman admitted to the clinic today for review of a pressure ulcer on her lower sacrum. She is referred from her primary physician's office after being seen on 3/22 with a 3 cm pressure area. Her daughter and caretaker accompanied her today state that the area first became obvious about a month ago and his since deteriorated. They have recently got [name] a home health involved and have been using [name] to the wound. They have ordered a pressure relief surface for her mattress. They are turning her religiously. They state that she eats well and they've been forcing fluids on her. She is on a multivitamin. [facility] point last albumin I see was 4.4 on 10/28. The patient has advanced parkinsonism which looks superficially like advanced Parkinson's disease although her daughter tells me she did not ever respond to Sinemet therefore this may have another pathology with signs of parkinsonism. However I think this is largely a mute point currently. She also has dementia and is nonambulatory. Since this started they have been keeping her in bed and turning her religiously every 2 hours. She lives at home in [place] with her husband with 24/7 care giving 01/13/17 santyl change qd. Still will require further debridement. continue santyl. 01/20/17; patient's wound actually looks some better less adherent necrotic surface. There is actually visible granulation. We're using  [name] 01/27/17; better-looking surface but still a lot of necrotic tissue on the base of this wound. The periwound erythema is better than last week we are still using [name]. Her daughter tells us that she is still having trouble with the pressure-relief mattress through medical modalities 02/03/18; I had the patient scheduled for a two week followup however her daughter brought her in early concerned for discoloration on 2 areas of the wound circumference. We have bee using saintly Electronic Signature(s) Signed: 02/04/2017 12:30:45 PM By: [name] Entered By: Tina Najjarobson, Edker Punt on 02/04/2017 05:15:31 [name], [name] ([id number]) -------------------------------------------------------------------------------- Physical Exam Details Patient Name: [name], [name] Date of Service: 02/03/2017 2:30 PM Medical Record Patient Account Number: [id number] Number: Treating RN: [name] 1922-10-23 (83 y.o. Other Clinician: Date of Birth/Sex: Female) Treating [name] Primary Care Provider: Vonita MossRISSMAN, [name] Provider/Extender: G Referring Provider: Vonita MossRISSMAN, [name] Weeks in Treatment: 3 Constitutional Patient is hypotensive.. Pulse regular and within target range for patient.Marland Kitchen. Respirations regular, non-labored and within target range.. Temperature is normal and within the target range for the patient.. very frail appearing patient. Respiratory Respiratory effort is easy and symmetric bilaterally. Rate is normal at rest and on room air.. Bilateral breath sounds are clear and equal in all lobes with no wheezes, rales or rhonchi.. Cardiovascular appears to be hydrated. Gastrointestinal (GI) Abdomen is soft and non-distended without masses or tenderness. Bowel sounds active in all quadrants.. Psychiatric dementia no major change. Notes Wound Exam; Surface of the wound bed appear improved with the santyl . However there are two darkened areas on the wound circumference.  these are non tender, not warm. Looks like subdermal hemorrhage likely from pressure in spite of the heroic efforts of the caregivers and her  family. I don't believe at this point there is infection Electronic Signature(s) Signed: 02/04/2017 12:30:45 PM By: Tina Najjar [name] Entered By: [name] on 02/04/2017 09:81:19 [name] ([id number]) -------------------------------------------------------------------------------- Physician Orders Details Patient Name: [name] Date of Service: 02/03/2017 2:30 PM Medical Record Patient Account Number: [id number] Number: Treating RN: [name] 1923/02/22 (83 y.o. Other Clinician: Date of Birth/Sex: Female) Treating [name] Primary Care Provider: Vonita Moss Provider/Extender: G Referring Provider: Vonita Moss Weeks in Treatment: 3 Verbal / Phone Orders: No Diagnosis Coding Wound Cleansing Wound #1 Midline Coccyx o Clean wound with Normal Saline. o May Shower, gently pat wound dry prior to applying new dressing. Anesthetic Wound #1 Midline Coccyx o Topical Lidocaine 4% cream applied to wound bed prior to debridement - in clinic Skin Barriers/Peri-Wound Care Wound #1 Midline Coccyx o Skin Prep Primary Wound Dressing Wound #1 Midline Coccyx o [name] Ointment Secondary Dressing Wound #1 Midline Coccyx o Dry Gauze o Boardered Foam Dressing - silicon bordered dressing due to skin breakdown Dressing Change Frequency Wound #1 Midline Coccyx o Change dressing every day. Follow-up Appointments Wound #1 Midline Coccyx o Return Appointment in 2 weeks. Off-Loading Wound #1 Midline Coccyx SIARA, GORDER. ([id number]) o Roho cushion for wheelchair - [facility] please order roho cushion or gel cushion for patients wheelchair - whichever she qualifies for o Turn and reposition every 2 hours Additional Orders / Instructions Wound #1 Midline Coccyx o Increase protein intake. -  please add protein supplements to patients diet o Other: - please add vitamin A, vitamin C and zinc supplements to patients diet Home Health Wound #1 Midline Coccyx o Continue Home Health Visits - [place] o Home Health Nurse may visit PRN to address patientos wound care needs. o FACE TO FACE ENCOUNTER: MEDICARE and MEDICAID PATIENTS: I certify that this patient is under my care and that I had a face-to-face encounter that meets the physician face-to-face encounter requirements with this patient on this date. The encounter with the patient was in whole or in part for the following MEDICAL CONDITION: (primary reason for Home Healthcare) MEDICAL NECESSITY: I certify, that based on my findings, NURSING services are a medically necessary home health service. HOME BOUND STATUS: I certify that my clinical findings support that this patient is homebound (i.e., Due to illness or injury, pt requires aid of supportive devices such as crutches, cane, wheelchairs, walkers, the use of special transportation or the assistance of another person to leave their place of residence. There is a normal inability to leave the home and doing so requires considerable and taxing effort. Other absences are for medical reasons / religious services and are infrequent or of short duration when for other reasons). o If current dressing causes regression in wound condition, may D/C ordered dressing product/s and apply Normal Saline Moist Dressing daily until next Wound Healing Center / Other [name] appointment. Notify Wound Healing Center of regression in wound condition at [phone]. o Please direct any NON-WOUND related issues/requests for orders to patient's Primary Care Physician Medications-please add to medication list. Wound #1 Midline Coccyx o [name] Enzymatic Ointment Electronic Signature(s) Signed: 02/03/2017 4:56:51 PM By: [name] Signed: 02/04/2017 12:30:45 PM By: Tina Najjar [name] Entered  By: [name] on 02/03/2017 15:15:45 [name] ([id number]) -------------------------------------------------------------------------------- Problem List Details Patient Name: [name] Date of Service: 02/03/2017 2:30 PM Medical Record Patient Account Number: [id number] Number: Treating RN: [name] Nov 08, 1922 (83 y.o. Other Clinician: Date of Birth/Sex: Female) Treating Darlen Gledhill,  Zemira Zehring Primary Care Provider: Vonita Moss Provider/Extender: G Referring Provider: Vonita Moss Weeks in Treatment: 3 Active Problems ICD-10 Encounter Code Description Active Date Diagnosis L89.150 Pressure ulcer of sacral region, unstageable 01/07/2017 Yes G20 Parkinson's disease 01/07/2017 Yes Inactive Problems Resolved Problems Electronic Signature(s) Signed: 02/04/2017 12:30:45 PM By: Tina Najjar [name] Entered By: [name] on 02/04/2017 05:11:54 [name] ([id number]) -------------------------------------------------------------------------------- Progress Note Details Patient Name: [name] Date of Service: 02/03/2017 2:30 PM Medical Record Patient Account Number: [id number] Number: Treating RN: [name] 1923/06/10 (83 y.o. Other Clinician: Date of Birth/Sex: Female) Treating Cregg Jutte Primary Care Provider: Vonita Moss Provider/Extender: G Referring Provider: Vonita Moss Weeks in Treatment: 3 Subjective History of Present Illness (HPI) 01/07/17 this is a 83 year old woman admitted to the clinic today for review of a pressure ulcer on her lower sacrum. She is referred from her primary physician's office after being seen on 3/22 with a 3 cm pressure area. Her daughter and caretaker accompanied her today state that the area first became obvious about a month ago and his since deteriorated. They have recently got [name] a home health involved and have been using [name] to the wound. They have ordered a  pressure relief surface for her mattress. They are turning her religiously. They state that she eats well and they've been forcing fluids on her. She is on a multivitamin. Looking through [facility] point last albumin I see was 4.4 on 10/28. The patient has advanced parkinsonism which looks superficially like advanced Parkinson's disease although her daughter tells me she did not ever respond to Sinemet therefore this may have another pathology with signs of parkinsonism. However I think this is largely a mute point currently. She also has dementia and is nonambulatory. Since this started they have been keeping her in bed and turning her religiously every 2 hours. She lives at home in [place] with her husband with 24/7 care giving 01/13/17 santyl change qd. Still will require further debridement. continue santyl. 01/20/17; patient's wound actually looks some better less adherent necrotic surface. There is actually visible granulation. We're using [name] 01/27/17; better-looking surface but still a lot of necrotic tissue on the base of this wound. The periwound erythema is better than last week we are still using [name]. Her daughter tells [place] that she is still having trouble with the pressure-relief mattress through medical modalities 02/03/18; I had the patient scheduled for a two week followup however her daughter brought her in early concerned for discoloration on 2 areas of the wound circumference. We have bee using saintly Objective Constitutional Patient is hypotensive.. Pulse regular and within target range for patient.Marland Kitchen Respirations regular, non-labored and within target range.. Temperature is normal and within the target range for the patient.. very frail appearing patient. Vitals Time Taken: 3:01 PM, Temperature: 97.8 F, Pulse: 54 bpm, Respiratory Rate: 16 breaths/min, Blood Pressure: 92/54 mmHg. [name], [name] ([id number]) Respiratory Respiratory effort is easy and symmetric  bilaterally. Rate is normal at rest and on room air.. Bilateral breath sounds are clear and equal in all lobes with no wheezes, rales or rhonchi.. Cardiovascular appears to be hydrated. Gastrointestinal (GI) Abdomen is soft and non-distended without masses or tenderness. Bowel sounds active in all quadrants.. Psychiatric dementia no major change. General Notes: Wound Exam; Surface of the wound bed appear improved with the santyl . However there are two darkened areas on the wound circumference. these are non tender, not warm. Looks like subdermal hemorrhage likely from pressure in spite of  the heroic efforts of the caregivers and her family. I don't believe at this point there is infection Integumentary (Hair, Skin) Wound #1 status is Open. Original cause of wound was Pressure Injury. The wound is located on the Midline Coccyx. The wound measures 3.1cm length x 3cm width x 0.5cm depth; 7.304cm^2 area and 3.652cm^3 volume. There is Fat Layer (Subcutaneous Tissue) Exposed exposed. There is no tunneling or undermining noted. There is a large amount of serous drainage noted. The wound margin is flat and intact. There is medium (34-66%) granulation within the wound bed. There is a medium (34-66%) amount of necrotic tissue within the wound bed including Adherent Slough. The periwound skin appearance exhibited: Induration, Erythema. The periwound skin appearance did not exhibit: Callus, Crepitus, Excoriation, Rash, Scarring, Dry/Scaly, Maceration, Atrophie Blanche, Cyanosis, Ecchymosis, Hemosiderin Staining, Mottled, Pallor, Rubor. The surrounding wound skin color is noted with erythema which is circumferential. Periwound temperature was noted as No Abnormality. The periwound has tenderness on palpation. Assessment Active Problems ICD-10 L89.150 - Pressure ulcer of sacral region, unstageable G20 - Parkinson's disease Plan MONZERRATH, MCBURNEY. ([id number]) Wound Cleansing: Wound #1 Midline  Coccyx: Clean wound with Normal Saline. May Shower, gently pat wound dry prior to applying new dressing. Anesthetic: Wound #1 Midline Coccyx: Topical Lidocaine 4% cream applied to wound bed prior to debridement - in clinic Skin Barriers/Peri-Wound Care: Wound #1 Midline Coccyx: Skin Prep Primary Wound Dressing: Wound #1 Midline Coccyx: [name] Ointment Secondary Dressing: Wound #1 Midline Coccyx: Dry Gauze Boardered Foam Dressing - silicon bordered dressing due to skin breakdown Dressing Change Frequency: Wound #1 Midline Coccyx: Change dressing every day. Follow-up Appointments: Wound #1 Midline Coccyx: Return Appointment in 2 weeks. Off-Loading: Wound #1 Midline Coccyx: Roho cushion for wheelchair - [facility] please order roho cushion or gel cushion for patients wheelchair - whichever she qualifies for Turn and reposition every 2 hours Additional Orders / Instructions: Wound #1 Midline Coccyx: Increase protein intake. - please add protein supplements to patients diet Other: - please add vitamin A, vitamin C and zinc supplements to patients diet Home Health: Wound #1 Midline Coccyx: Continue Home Health Visits - [facility] Nurse may visit PRN to address patient s wound care needs. FACE TO FACE ENCOUNTER: MEDICARE and MEDICAID PATIENTS: I certify that this patient is under my care and that I had a face-to-face encounter that meets the physician face-to-face encounter requirements with this patient on this date. The encounter with the patient was in whole or in part for the following MEDICAL CONDITION: (primary reason for Home Healthcare) MEDICAL NECESSITY: I certify, that based on my findings, NURSING services are a medically necessary home health service. HOME BOUND STATUS: I certify that my clinical findings support that this patient is homebound (i.e., Due to illness or injury, pt requires aid of supportive devices such as crutches, cane, wheelchairs, walkers, the  use of special transportation or the assistance of another person to leave their place of residence. There is a normal inability to leave the home and doing so requires considerable and taxing effort. Other absences are for medical reasons / religious services and are infrequent or of short duration when for other reasons). If current dressing causes regression in wound condition, may D/C ordered dressing product/s and apply Normal Saline Moist Dressing daily until next Wound Healing Center / Other [name] appointment. Notify Wound Healing Center of regression in wound condition at [phone]. Please direct any NON-WOUND related issues/requests for orders to patient's Primary Care Physician [name]. (  [phone]) Medications-please add to medication list.: Wound #1 Midline Coccyx: [name] Enzymatic Ointment No change to the santyl for the moment however likely to change to a collagen based dressing next week I would like to check the area in one week, advised to call if there are quick changes to the wound Electronic Signature(s) Signed: 02/04/2017 12:30:45 PM By: Tina Najjar [name] Entered By: [name] on 02/04/2017 05:26:46 [name] ([id number]) -------------------------------------------------------------------------------- SuperBill Details Patient Name: [name] Date of Service: 02/03/2017 Medical Record Patient Account Number: [id number] Number: Treating RN: [name] Feb 23, 1923 (83 y.o. Other Clinician: Date of Birth/Sex: Female) Treating Sofia Vanmeter Primary Care Provider: Vonita Moss Provider/Extender: G Referring Provider: Vonita Moss Weeks in Treatment: 3 Diagnosis Coding ICD-10 Codes Code Description L89.150 Pressure ulcer of sacral region, unstageable G20 Parkinson's disease Facility Procedures CPT4 Code: [id number] Description: 99213 - WOUND CARE VISIT-LEV 3 EST PT Modifier: Quantity: 1 Physician Procedures CPT4  Code: [id number] Description: 99213 - WC PHYS LEVEL 3 - EST PT ICD-10 Description Diagnosis L89.150 Pressure ulcer of sacral region, unstageable Modifier: Quantity: 1 Electronic Signature(s) Signed: 02/04/2017 12:30:45 PM By: Tina Najjar [name] Entered By: [name] on 02/04/2017 05:27:06

## 2017-02-05 NOTE — Telephone Encounter (Signed)
Verbal okay given to [place]. Gain additional info about BP it was 178/72, however pt had just taken medication about half hour before. Verdon CumminsJesse stated caregiver said it would spike occasionally, but always returned to normal limits shortly.

## 2017-02-06 ENCOUNTER — Other Ambulatory Visit: Payer: Medicare Other

## 2017-02-06 ENCOUNTER — Telehealth: Payer: Self-pay | Admitting: Family Medicine

## 2017-02-06 DIAGNOSIS — R399 Unspecified symptoms and signs involving the genitourinary system: Secondary | ICD-10-CM

## 2017-02-06 LAB — URINALYSIS, ROUTINE W REFLEX MICROSCOPIC
Bilirubin, UA: NEGATIVE
Glucose, UA: NEGATIVE
Ketones, UA: NEGATIVE
Nitrite, UA: NEGATIVE
Protein, UA: NEGATIVE
Specific Gravity, UA: 1.015 (ref 1.005–1.030)
Urobilinogen, Ur: 0.2 mg/dL (ref 0.2–1.0)
pH, UA: 8.5 — ABNORMAL HIGH (ref 5.0–7.5)

## 2017-02-06 LAB — MICROSCOPIC EXAMINATION

## 2017-02-06 NOTE — Telephone Encounter (Signed)
The caregiver dropped off a specimen to be checked for UTI on patient.  Thanks

## 2017-02-06 NOTE — Telephone Encounter (Signed)
Order released for the lab.

## 2017-02-09 ENCOUNTER — Telehealth: Payer: Self-pay | Admitting: Family Medicine

## 2017-02-09 MED ORDER — NITROFURANTOIN MONOHYD MACRO 100 MG PO CAPS: 100.0000 mg | ORAL_CAPSULE | Freq: Two times a day (BID) | ORAL | 0 refills | Status: DC

## 2017-02-09 NOTE — Telephone Encounter (Signed)
Message relayed to patient. Verbalized understanding and denied questions.   

## 2017-02-09 NOTE — Telephone Encounter (Signed)
Please let them know it does look like she has a UTI- I've sent an antibiotic to her pharmacy. Let us know if she's not feeling better. Thanks!

## 2017-02-10 ENCOUNTER — Encounter: Payer: Medicare Other | Admitting: Internal Medicine

## 2017-02-10 DIAGNOSIS — L89150 Pressure ulcer of sacral region, unstageable: Secondary | ICD-10-CM | POA: Diagnosis not present

## 2017-02-11 NOTE — Progress Notes (Signed)
Tina, [name] ([id number]) Visit Report for 02/10/2017 Debridement Details Patient Name: [name], [name] Date of Service: 02/10/2017 10:15 AM Medical Record Patient Account Number: [id number] Number: Treating RN: Curtis Sites July 31, 1923 (83 y.o. Other Clinician: Date of Birth/Sex: Female) Treating Santanna Olenik Primary Care Provider: Vonita Moss Provider/Extender: G Referring Provider: Vonita Moss Weeks in Treatment: 4 Debridement Performed for Wound #1 Midline Coccyx Assessment: Performed By: Physician Maxwell Caul, MD Debridement: Debridement Pre-procedure Yes - 10:39 Verification/Time Out Taken: Start Time: 10:39 Pain Control: Lidocaine 4% Topical Solution Level: Skin/Subcutaneous Tissue Total Area Debrided (L x 2.7 (cm) x 2.6 (cm) = 7.02 (cm) W): Tissue and other Viable, Non-Viable, Fibrin/Slough, Subcutaneous material debrided: Instrument: Curette Bleeding: Minimum Hemostasis Achieved: Pressure End Time: 10:41 Procedural Pain: 0 Post Procedural Pain: 0 Response to Treatment: Procedure was tolerated well Post Debridement Measurements of Total Wound Length: (cm) 2.7 Stage: Category/Stage III Width: (cm) 2.6 Depth: (cm) 0.6 Volume: (cm) 3.308 Character of Wound/Ulcer Post Improved Debridement: Severity of Tissue Post Fat layer exposed Debridement: Post Procedure Diagnosis Same as Pre-procedure REIGHLYNN, SWINEY ([id number]) Electronic Signature(s) Signed: 02/10/2017 4:34:27 PM By: Curtis Sites Signed: 02/10/2017 4:47:06 PM By: Baltazar Najjar MD Entered By: Curtis Sites on 02/10/2017 10:42:12 [name] ([id number]) -------------------------------------------------------------------------------- HPI Details Patient Name: [name] Date of Service: 02/10/2017 10:15 AM Medical Record Patient Account Number: [id number] Number: Treating RN: Curtis Sites March 14, 1923 (83 y.o. Other Clinician: Date of  Birth/Sex: Female) Treating Abagale Boulos Primary Care Provider: Vonita Moss Provider/Extender: G Referring Provider: Vonita Moss Weeks in Treatment: 4 History of Present Illness HPI Description: 01/07/17 this is a 83 year old woman admitted to the clinic today for review of a pressure ulcer on her lower sacrum. She is referred from her primary physician's office after being seen on 3/22 with a 3 cm pressure area. Her daughter and caretaker accompanied her today state that the area first became obvious about a month ago and his since deteriorated. They have recently got [name] a home health involved and have been using [name] to the wound. They have ordered a pressure relief surface for her mattress. They are turning her religiously. They state that she eats well and they've been forcing fluids on her. She is on a multivitamin. Looking through [facility] point last albumin I see was 4.4 on 10/28. The patient has advanced parkinsonism which looks superficially like advanced Parkinson's disease although her daughter tells me she did not ever respond to Sinemet therefore this may have another pathology with signs of parkinsonism. However I think this is largely a mute point currently. She also has dementia and is nonambulatory. Since this started they have been keeping her in bed and turning her religiously every 2 hours. She lives at home in [place] with her husband with 24/7 care giving 01/13/17 santyl change qd. Still will require further debridement. continue santyl. 01/20/17; patient's wound actually looks some better less adherent necrotic surface. There is actually visible granulation. We're using [name] 01/27/17; better-looking surface but still a lot of necrotic tissue on the base of this wound. The periwound erythema is better than last week we are still using [name]. Her daughter tells [place] that she is still having trouble with the pressure-relief mattress through medical  modalities 02/03/18; I had the patient scheduled for a two week followup however her daughter brought her in early concerned for discoloration on 2 areas of the wound circumference. We have bee using santyl 02/10/17;Better looking surface to the wound. Rim appears  better suggesting better offloading. Using santyl Electronic Signature(s) Signed: 02/10/2017 4:47:06 PM By: Baltazar Najjar MD Entered By: Baltazar Najjar on 02/10/2017 11:13:15 [name] ([id number]) -------------------------------------------------------------------------------- Physical Exam Details Patient Name: [name] Date of Service: 02/10/2017 10:15 AM Medical Record Patient Account Number: [id number] Number: Treating RN: Curtis Sites Mar 09, 1923 (83 y.o. Other Clinician: Date of Birth/Sex: Female) Treating Siboney Requejo Primary Care Provider: Vonita Moss Provider/Extender: G Referring Provider: Vonita Moss Weeks in Treatment: 4 Constitutional Patient is hypotensive.. Pulse regular and within target range for patient.Marland Kitchen Respirations regular, non-labored and within target range.. Temperature is normal and within the target range for the patient.. Patient's appearance is neat and clean. Appears in no acute distress. Well nourished and well developed.. Eyes no icterus. Respiratory Respiratory effort is easy and symmetric bilaterally. Rate is normal at rest and on room air.. Cardiovascular Heart rhythm and rate regular, without murmur or gallop.. Gastrointestinal (GI) Abdomen is soft and non-distended without masses or tenderness. Bowel sounds active in all quadrants.. Psychiatric baseline severe dementia. Notes 02/10/17 wound exam; the wound looks better this week. Less dark discoloration of the rim suggesting better offloading. The base of the wound appears better although I'm debrided this with a #3 curet to remove the eminence of acrotic surface material. She has some undermining  from 1 to 5:00 2-3 mm no evidence of surrounding infection here. Electronic Signature(s) Signed: 02/10/2017 4:47:06 PM By: Baltazar Najjar MD Entered By: Baltazar Najjar on 02/10/2017 11:18:26 [name] ([id number]) -------------------------------------------------------------------------------- Physician Orders Details Patient Name: [name] Date of Service: 02/10/2017 10:15 AM Medical Record Patient Account Number: [id number] Number: Treating RN: Curtis Sites Aug 12, 1923 (83 y.o. Other Clinician: Date of Birth/Sex: Female) Treating Chosen Garron Primary Care Provider: Vonita Moss Provider/Extender: G Referring Provider: Vonita Moss Weeks in Treatment: 4 Verbal / Phone Orders: No Diagnosis Coding Wound Cleansing Wound #1 Midline Coccyx o Clean wound with Normal Saline. o May Shower, gently pat wound dry prior to applying new dressing. Anesthetic Wound #1 Midline Coccyx o Topical Lidocaine 4% cream applied to wound bed prior to debridement - in clinic Skin Barriers/Peri-Wound Care Wound #1 Midline Coccyx o Skin Prep Primary Wound Dressing Wound #1 Midline Coccyx o Other: - ColActive Plus Ag Secondary Dressing Wound #1 Midline Coccyx o Dry Gauze o Boardered Foam Dressing - silicon bordered dressing due to skin breakdown Dressing Change Frequency Wound #1 Midline Coccyx o Change dressing every day. Follow-up Appointments Wound #1 Midline Coccyx o Return Appointment in 2 weeks. Off-Loading Wound #1 Midline Coccyx DIAMON, REDDINGER. ([id number]) o Roho cushion for wheelchair - [facility] please order roho cushion or gel cushion for patients wheelchair - whichever she qualifies for o Turn and reposition every 2 hours Additional Orders / Instructions Wound #1 Midline Coccyx o Increase protein intake. - please add protein supplements to patients diet o Other: - please add vitamin A, vitamin C and zinc supplements to  patients diet Home Health Wound #1 Midline Coccyx o Continue Home Health Visits - [place] o Home Health Nurse may visit PRN to address patientos wound care needs. o FACE TO FACE ENCOUNTER: MEDICARE and MEDICAID PATIENTS: I certify that this patient is under my care and that I had a face-to-face encounter that meets the physician face-to-face encounter requirements with this patient on this date. The encounter with the patient was in whole or in part for the following MEDICAL CONDITION: (primary reason for Home Healthcare) MEDICAL NECESSITY: I certify, that based on my findings, NURSING  services are a medically necessary home health service. HOME BOUND STATUS: I certify that my clinical findings support that this patient is homebound (i.e., Due to illness or injury, pt requires aid of supportive devices such as crutches, cane, wheelchairs, walkers, the use of special transportation or the assistance of another person to leave their place of residence. There is a normal inability to leave the home and doing so requires considerable and taxing effort. Other absences are for medical reasons / religious services and are infrequent or of short duration when for other reasons). o If current dressing causes regression in wound condition, may D/C ordered dressing product/s and apply Normal Saline Moist Dressing daily until next Wound Healing Center / Other MD appointment. Notify Wound Healing Center of regression in wound condition at [phone]. o Please direct any NON-WOUND related issues/requests for orders to patient's Primary Care Physician Electronic Signature(s) Signed: 02/10/2017 4:34:27 PM By: Curtis Sites Signed: 02/10/2017 4:47:06 PM By: Baltazar Najjar MD Entered By: Curtis Sites on 02/10/2017 10:45:25 [name] ([id number]) -------------------------------------------------------------------------------- Problem List Details Patient Name: [name] Date of  Service: 02/10/2017 10:15 AM Medical Record Patient Account Number: [id number] Number: Treating RN: Curtis Sites 1923-07-08 (83 y.o. Other Clinician: Date of Birth/Sex: Female) Treating Ayiana Winslett Primary Care Provider: Vonita Moss Provider/Extender: G Referring Provider: Vonita Moss Weeks in Treatment: 4 Active Problems ICD-10 Encounter Code Description Active Date Diagnosis L89.150 Pressure ulcer of sacral region, unstageable 01/07/2017 Yes G20 Parkinson's disease 01/07/2017 Yes Inactive Problems Resolved Problems Electronic Signature(s) Signed: 02/10/2017 4:47:06 PM By: Baltazar Najjar MD Entered By: Baltazar Najjar on 02/10/2017 11:09:54 [name] ([id number]) -------------------------------------------------------------------------------- Progress Note Details Patient Name: [name] Date of Service: 02/10/2017 10:15 AM Medical Record Patient Account Number: [id number] Number: Treating RN: Curtis Sites 07/14/1923 (83 y.o. Other Clinician: Date of Birth/Sex: Female) Treating Verina Galeno Primary Care Provider: Vonita Moss Provider/Extender: G Referring Provider: Vonita Moss Weeks in Treatment: 4 Subjective History of Present Illness (HPI) 01/07/17 this is a 83 year old woman admitted to the clinic today for review of a pressure ulcer on her lower sacrum. She is referred from her primary physician's office after being seen on 3/22 with a 3 cm pressure area. Her daughter and caretaker accompanied her today state that the area first became obvious about a month ago and his since deteriorated. They have recently got [name] a home health involved and have been using [name] to the wound. They have ordered a pressure relief surface for her mattress. They are turning her religiously. They state that she eats well and they've been forcing fluids on her. She is on a multivitamin. Looking through [facility] point last albumin  I see was 4.4 on 10/28. The patient has advanced parkinsonism which looks superficially like advanced Parkinson's disease although her daughter tells me she did not ever respond to Sinemet therefore this may have another pathology with signs of parkinsonism. However I think this is largely a mute point currently. She also has dementia and is nonambulatory. Since this started they have been keeping her in bed and turning her religiously every 2 hours. She lives at home in [place] with her husband with 24/7 care giving 01/13/17 santyl change qd. Still will require further debridement. continue santyl. 01/20/17; patient's wound actually looks some better less adherent necrotic surface. There is actually visible granulation. We're using [name] 01/27/17; better-looking surface but still a lot of necrotic tissue on the base of this wound. The periwound erythema is better than  last week we are still using [name]. Her daughter tells [place] that she is still having trouble with the pressure-relief mattress through medical modalities 02/03/18; I had the patient scheduled for a two week followup however her daughter brought her in early concerned for discoloration on 2 areas of the wound circumference. We have bee using santyl 02/10/17;Better looking surface to the wound. Rim appears better suggesting better offloading. Using santyl Objective Constitutional Patient is hypotensive.. Pulse regular and within target range for patient.Marland Kitchen Respirations regular, non-labored and within target range.. Temperature is normal and within the target range for the patient.. Patient's appearance is neat and clean. Appears in no acute distress. Well nourished and well developed.. Vitals Time Taken: 10:22 AM, Temperature: 98.4 F, Pulse: 92 bpm, Respiratory Rate: 16 breaths/min, Bednarz, Seleta W. ([id number]) Blood Pressure: 98/64 mmHg. Eyes no icterus. Respiratory Respiratory effort is easy and symmetric bilaterally. Rate is  normal at rest and on room air.. Cardiovascular Heart rhythm and rate regular, without murmur or gallop.. Gastrointestinal (GI) Abdomen is soft and non-distended without masses or tenderness. Bowel sounds active in all quadrants.. Psychiatric baseline severe dementia. General Notes: 02/10/17 wound exam; the wound looks better this week. Less dark discoloration of the rim suggesting better offloading. The base of the wound appears better although I'm debrided this with a #3 curet to remove the eminence of acrotic surface material. She has some undermining from 1 to 5:00 2-3 mm no evidence of surrounding infection here. Integumentary (Hair, Skin) Wound #1 status is Open. Original cause of wound was Pressure Injury. The wound is located on the Midline Coccyx. The wound measures 2.7cm length x 2.6cm width x 0.5cm depth; 5.513cm^2 area and 2.757cm^3 volume. There is Fat Layer (Subcutaneous Tissue) Exposed exposed. There is no tunneling noted, however, there is undermining starting at 1:00 and ending at 5:00 with a maximum distance of 0.5cm. There is a large amount of serous drainage noted. The wound margin is flat and intact. There is large (67-100%) red granulation within the wound bed. There is a small (1-33%) amount of necrotic tissue within the wound bed including Adherent Slough. The periwound skin appearance exhibited: Induration, Erythema. The periwound skin appearance did not exhibit: Callus, Crepitus, Excoriation, Rash, Scarring, Dry/Scaly, Maceration, Atrophie Blanche, Cyanosis, Ecchymosis, Hemosiderin Staining, Mottled, Pallor, Rubor. The surrounding wound skin color is noted with erythema which is circumferential. Periwound temperature was noted as No Abnormality. The periwound has tenderness on palpation. Assessment Active Problems ICD-10 L89.150 - Pressure ulcer of sacral region, unstageable G20 - Parkinson's disease LATALIA, ETZLER. ([id number]) Procedures Wound #1 Wound #1 is  a Pressure Ulcer located on the Midline Coccyx . There was a Skin/Subcutaneous Tissue Debridement ([id number]) debridement with total area of 7.02 sq cm performed by Maxwell Caul, MD. with the following instrument(s): Curette to remove Viable and Non-Viable tissue/material including Fibrin/Slough and Subcutaneous after achieving pain control using Lidocaine 4% Topical Solution. A time out was conducted at 10:39, prior to the start of the procedure. A Minimum amount of bleeding was controlled with Pressure. The procedure was tolerated well with a pain level of 0 throughout and a pain level of 0 following the procedure. Post Debridement Measurements: 2.7cm length x 2.6cm width x 0.6cm depth; 3.308cm^3 volume. Post debridement Stage noted as Category/Stage III. Character of Wound/Ulcer Post Debridement is improved. Severity of Tissue Post Debridement is: Fat layer exposed. Post procedure Diagnosis Wound #1: Same as Pre-Procedure Plan Wound Cleansing: Wound #1 Midline Coccyx: Clean wound with Normal Saline. May  Shower, gently pat wound dry prior to applying new dressing. Anesthetic: Wound #1 Midline Coccyx: Topical Lidocaine 4% cream applied to wound bed prior to debridement - in clinic Skin Barriers/Peri-Wound Care: Wound #1 Midline Coccyx: Skin Prep Primary Wound Dressing: Wound #1 Midline Coccyx: Other: - ColActive Plus Ag Secondary Dressing: Wound #1 Midline Coccyx: Dry Gauze Boardered Foam Dressing - silicon bordered dressing due to skin breakdown Dressing Change Frequency: Wound #1 Midline Coccyx: Change dressing every day. Follow-up Appointments: Wound #1 Midline Coccyx: Return Appointment in 2 weeks. Off-Loading: Wound #1 Midline Coccyx: Tina MainlandSHAW, Cecila W. ([id number]) [facility] roho cushion or gel cushion for patients wheelchair - whichever she qualifies for Turn and reposition every 2 hours Additional Orders /  Instructions: Wound #1 Midline Coccyx: Increase protein intake. - please add protein supplements to patients diet Other: - please add vitamin A, vitamin C and zinc supplements to patients diet Home Health: Wound #1 Midline Coccyx: [facility] may [facility] PRN to address patient s wound care needs. FACE TO FACE ENCOUNTER: MEDICARE and MEDICAID PATIENTS: I certify that this patient is under my care and that I had a face-to-face encounter that meets the physician face-to-face encounter requirements with this patient on this date. The encounter with the patient was in whole or in part for the following MEDICAL CONDITION: (primary reason for Home Healthcare) MEDICAL NECESSITY: I certify, that based on my findings, NURSING services are a medically necessary home health service. HOME BOUND STATUS: I certify that my clinical findings support that this patient is homebound (i.e., Due to illness or injury, pt requires aid of supportive devices such as crutches, cane, wheelchairs, walkers, the use of special transportation or the assistance of another person to leave their place of residence. There is a normal inability to leave the home and doing so requires considerable and taxing effort. Other absences are for medical reasons / religious services and are infrequent or of short duration when for other reasons). If current dressing causes regression in wound condition, may D/C ordered dressing product/s and apply Normal Saline Moist Dressing daily until next Wound Healing Center / Other MD appointment. Notify Wound Healing Center of regression in wound condition at [phone]. Please direct any NON-WOUND related issues/requests for orders to patient's Primary Care Physician #1 we changed from [name] today to silver collagen border foam the community changed every second day. #2 daughter relates that she is now on nitrofurantoin for UTI #3 follow-up 2  weeks Electronic Signature(s) Signed: 02/10/2017 4:47:06 PM By: Baltazar Najjarobson, Phenix Grein MD Entered By: Baltazar Najjarobson, Hailynn Slovacek on 02/10/2017 11:19:41 Tina MainlandSHAW, Alleta W. ([id number]) -------------------------------------------------------------------------------- SuperBill Details Patient Name: Tina MainlandSHAW, Mylinh W. Date of Service: 02/10/2017 Medical Record Patient Account Number: [id number] Number: Treating RN: Curtis SitesDorthy, Joanna [birth year] (83 y.o. Other Clinician: Date of Birth/Sex: Female) Treating Darrien Belter Primary Care Provider: Vonita MossRISSMAN, MARK Provider/Extender: G Referring Provider: Vonita MossRISSMAN, MARK Weeks in Treatment: 4 Diagnosis Coding ICD-10 Codes Code Description L89.150 Pressure ulcer of sacral region, unstageable G20 Parkinson's disease Facility Procedures CPT4 Code: [id number] Description: 11042 - DEB SUBQ TISSUE 20 SQ CM/< ICD-10 Description Diagnosis L89.150 Pressure ulcer of sacral region, unstageable Modifier: Quantity: 1 Physician Procedures CPT4 Code: [id number] Description: 11042 - WC PHYS SUBQ TISS 20 SQ CM ICD-10 Description Diagnosis L89.150 Pressure ulcer of sacral region, unstageable Modifier: Quantity: 1 Electronic Signature(s) Signed: 02/10/2017 4:47:06 PM By: Baltazar Najjarobson, Dara Beidleman MD Entered By: Baltazar Najjarobson, Amar Keenum on 02/10/2017 11:20:06

## 2017-02-11 NOTE — Progress Notes (Signed)
Tina MainlandSHAW, Morgana W. ([id number]) Visit Report for 02/10/2017 Arrival Information Details Patient Name: Tina MainlandSHAW, Kasheena W. Date of Service: 02/10/2017 10:15 AM Medical Record Patient Account Number: [id number] Number: Treating RN: Curtis SitesDorthy, Joanna 07-Apr-1923 (83 y.o. Other Clinician: Date of Birth/Sex: Female) Treating ROBSON, MICHAEL Primary Care Vidur Knust: Vonita MossRISSMAN, MARK Wadie Liew/Extender: G Referring Traci Gafford: Vonita MossRISSMAN, MARK Weeks in Treatment: 4 Visit Information History Since Last Visit Added or deleted any medications: No Patient Arrived: Wheel Chair Any new allergies or adverse No reactions: Arrival Time: 10:20 Had a fall or experienced change in No Accompanied By: dtr activities of daily living that may Transfer Assistance: Manual affect Patient Identification Verified: Yes risk of falls: Secondary Verification Process Yes Signs or symptoms of abuse/neglect No Completed: since last visito Patient Requires Transmission-Based No Hospitalized since last visit: No Precautions: Has Dressing in Place as Yes Patient Has Alerts: No Prescribed: Pain Present Now: Unable to Respond Electronic Signature(s) Signed: 02/10/2017 4:34:27 PM By: Curtis Sitesorthy, Joanna Entered By: Curtis Sitesorthy, Joanna on 02/10/2017 10:21:48 Tina MainlandSHAW, Serenity W. ([id number]) -------------------------------------------------------------------------------- Encounter Discharge Information Details Patient Name: Tina MainlandSHAW, Jenevie W. Date of Service: 02/10/2017 10:15 AM Medical Record Patient Account Number: [id number] Number: Treating RN: Curtis Sitesorthy, Joanna 07-Apr-1923 (83 y.o. Other Clinician: Date of Birth/Sex: Female) Treating ROBSON, MICHAEL Primary Care Chester Sibert: Vonita MossRISSMAN, MARK Lyndol Vanderheiden/Extender: G Referring Taggert Bozzi: Vonita MossRISSMAN, MARK Weeks in Treatment: 4 Encounter Discharge Information Items Discharge Pain Level: 0 Discharge Condition: Stable Ambulatory Status: Wheelchair Discharge Destination:  Home Transportation: Private Auto Accompanied By: dtr Schedule Follow-up Appointment: Yes Medication Reconciliation completed and provided to Patient/Care No Lizzie An: Provided on Clinical Summary of Care: 02/10/2017 Form Type Recipient Paper Patient ES Electronic Signature(s) Signed: 02/10/2017 12:23:36 PM By: Curtis Sitesorthy, Joanna Previous Signature: 02/10/2017 11:00:10 AM Version By: Gwenlyn PerkingMoore, Shelia Entered By: Curtis Sitesorthy, Joanna on 02/10/2017 12:23:36 Tina MainlandSHAW, Bobbye W. ([id number]) -------------------------------------------------------------------------------- Multi Wound Chart Details Patient Name: Tina MainlandSHAW, Edmund W. Date of Service: 02/10/2017 10:15 AM Medical Record Patient Account Number: [id number] Number: Treating RN: Curtis Sitesorthy, Joanna 07-Apr-1923 (83 y.o. Other Clinician: Date of Birth/Sex: Female) Treating ROBSON, MICHAEL Primary Care Kemba Hoppes: Vonita MossRISSMAN, MARK Kaisy Severino/Extender: G Referring Seri Kimmer: Vonita MossRISSMAN, MARK Weeks in Treatment: 4 Vital Signs Height(in): Pulse(bpm): 92 Weight(lbs): Blood Pressure 98/64 (mmHg): Body Mass Index(BMI): Temperature(F): 98.4 Respiratory Rate 16 (breaths/min): Photos: [1:No Photos] [N/A:N/A] Wound Location: [1:Coccyx - Midline] [N/A:N/A] Wounding Event: [1:Pressure Injury] [N/A:N/A] Primary Etiology: [1:Pressure Ulcer] [N/A:N/A] Comorbid History: [1:Anemia, Hypertension, Dementia] [N/A:N/A] Date Acquired: [1:12/01/2016] [N/A:N/A] Weeks of Treatment: [1:4] [N/A:N/A] Wound Status: [1:Open] [N/A:N/A] Measurements L x W x D 2.7x2.6x0.5 [N/A:N/A] (cm) Area (cm) : [1:5.513] [N/A:N/A] Volume (cm) : [1:2.757] [N/A:N/A] % Reduction in Area: [1:-32.70%] [N/A:N/A] % Reduction in Volume: -65.90% [N/A:N/A] Starting Position 1 1 (o'clock): Ending Position 1 [1:5] (o'clock): Maximum Distance 1 0.5 (cm): Undermining: [1:Yes] [N/A:N/A] Classification: [1:Category/Stage III] [N/A:N/A] Exudate Amount: [1:Large] [N/A:N/A] Exudate Type:  [1:Serous] [N/A:N/A] Exudate Color: [1:amber] [N/A:N/A] Wound Margin: [1:Flat and Intact] [N/A:N/A] Granulation Amount: [1:Large (67-100%)] [N/A:N/A] Granulation Quality: Red N/A N/A Necrotic Amount: Small (1-33%) N/A N/A Exposed Structures: Fat Layer (Subcutaneous N/A N/A Tissue) Exposed: Yes Fascia: No Tendon: No Muscle: No Joint: No Bone: No Epithelialization: None N/A N/A Debridement: Debridement ([id number](11042- N/A N/A 11047) Pre-procedure 10:39 N/A N/A Verification/Time Out Taken: Pain Control: Lidocaine 4% Topical N/A N/A Solution Tissue Debrided: Fibrin/Slough, N/A N/A Subcutaneous Level: Skin/Subcutaneous N/A N/A Tissue Debridement Area (sq 7.02 N/A N/A cm): Instrument: Curette N/A N/A Bleeding: Minimum N/A N/A Hemostasis Achieved: Pressure N/A N/A Procedural Pain: 0 N/A N/A Post Procedural Pain: 0 N/A N/A Debridement Treatment  Procedure was tolerated N/A N/A Response: well Post Debridement 2.7x2.6x0.6 N/A N/A Measurements L x W x D (cm) Post Debridement 3.308 N/A N/A Volume: (cm) Post Debridement Category/Stage III N/A N/A Stage: Periwound Skin Texture: Induration: Yes N/A N/A Excoriation: No Callus: No Crepitus: No Rash: No Scarring: No Periwound Skin Maceration: No N/A N/A Moisture: Dry/Scaly: No Periwound Skin Color: Erythema: Yes N/A N/A Atrophie Blanche: No Cyanosis: No Ecchymosis: No Hemosiderin Staining: No MALIN, SAMBRANO. ([id number]) Mottled: No Pallor: No Rubor: No Erythema Location: Circumferential N/A N/A Temperature: No Abnormality N/A N/A Tenderness on Yes N/A N/A Palpation: Wound Preparation: Ulcer Cleansing: N/A N/A Rinsed/Irrigated with Saline Topical Anesthetic Applied: Other: lidocaine 4% Procedures Performed: Debridement N/A N/A Treatment Notes Electronic Signature(s) Signed: 02/10/2017 4:47:06 PM By: Baltazar Najjar MD Entered By: Baltazar Najjar on 02/10/2017 11:10:04 [name]  ([id number]) -------------------------------------------------------------------------------- Multi-Disciplinary Care Plan Details Patient Name: [name] Date of Service: 02/10/2017 10:15 AM Medical Record Patient Account Number: [id number] Number: Treating RN: Curtis Sites 08-28-1923 (83 y.o. Other Clinician: Date of Birth/Sex: Female) Treating ROBSON, MICHAEL Primary Care Lizvette Lightsey: Vonita Moss Danashia Landers/Extender: G Referring Page Lancon: Vonita Moss Weeks in Treatment: 4 Active Inactive ` Abuse / Safety / Falls / Self Care Management Nursing Diagnoses: Impaired physical mobility Potential for falls Goals: Patient will remain injury free Date Initiated: 01/07/2017 Target Resolution Date: 04/03/2017 Goal Status: Active Interventions: Assess fall risk on admission and as needed Notes: ` Nutrition Nursing Diagnoses: Potential for alteratiion in Nutrition/Potential for imbalanced nutrition Goals: Patient/caregiver agrees to and verbalizes understanding of need to use nutritional supplements and/or vitamins as prescribed Date Initiated: 01/07/2017 Target Resolution Date: 04/03/2017 Goal Status: Active Interventions: Assess patient nutrition upon admission and as needed per policy Notes: ` Orientation to the Wound Care Program DETRICE, CALES ([id number]) Nursing Diagnoses: Knowledge deficit related to the wound healing center program Goals: Patient/caregiver will verbalize understanding of the Wound Healing Center Program Date Initiated: 01/07/2017 Target Resolution Date: 04/03/2017 Goal Status: Active Interventions: Provide education on orientation to the wound center Notes: ` Pressure Nursing Diagnoses: Knowledge deficit related to causes and risk factors for pressure ulcer development Goals: Patient will remain free from development of additional pressure ulcers Date Initiated: 01/07/2017 Target Resolution Date: 04/03/2017 Goal Status:  Active Interventions: Assess potential for pressure ulcer upon admission and as needed Notes: ` Wound/Skin Impairment Nursing Diagnoses: Impaired tissue integrity Goals: Patient/caregiver will verbalize understanding of skin care regimen Date Initiated: 01/07/2017 Target Resolution Date: 04/03/2017 Goal Status: Active Ulcer/skin breakdown will have a volume reduction of 30% by week 4 Date Initiated: 01/07/2017 Target Resolution Date: 04/03/2017 Goal Status: Active Ulcer/skin breakdown will have a volume reduction of 50% by week 8 Date Initiated: 01/07/2017 Target Resolution Date: 04/03/2017 Goal Status: Active Ulcer/skin breakdown will have a volume reduction of 80% by week 12 ANAYIA, EUGENE ([id number]) Date Initiated: 01/07/2017 Target Resolution Date: 04/03/2017 Goal Status: Active Ulcer/skin breakdown will heal within 14 weeks Date Initiated: 01/07/2017 Target Resolution Date: 04/03/2017 Goal Status: Active Interventions: Assess patient/caregiver ability to obtain necessary supplies Assess patient/caregiver ability to perform ulcer/skin care regimen upon admission and as needed Assess ulceration(s) every visit Notes: Electronic Signature(s) Signed: 02/10/2017 4:34:27 PM By: Curtis Sites Entered By: Curtis Sites on 02/10/2017 10:40:25 [name] ([id number]) -------------------------------------------------------------------------------- Pain Assessment Details Patient Name: [name] Date of Service: 02/10/2017 10:15 AM Medical Record Patient Account Number: [id number] Number: Treating RN: Curtis Sites 09/16/1923 (83 y.o. Other Clinician: Date of Birth/Sex: Female) Treating ROBSON, MICHAEL  Primary Care Sahand Gosch: Vonita Moss Aariyana Manz/Extender: G Referring Melinda Gwinner: Vonita Moss Weeks in Treatment: 4 Active Problems Location of Pain Severity and Description of Pain Patient Has Paino Patient Unable to Respond Site Locations Pain Management  and Medication Current Pain Management: Electronic Signature(s) Signed: 02/10/2017 4:34:27 PM By: Curtis Sites Entered By: Curtis Sites on 02/10/2017 10:21:57 [name] ([id number]) -------------------------------------------------------------------------------- Patient/Caregiver Education Details Patient Name: [name] Date of Service: 02/10/2017 10:15 AM Medical Record Patient Account Number: [id number] Number: Treating RN: Curtis Sites 08-04-1923 (83 y.o. Other Clinician: Date of Birth/Gender: Female) Treating ROBSON, MICHAEL Primary Care Physician: Vonita Moss Physician/Extender: G Referring Physician: Veverly Fells in Treatment: 4 Education Assessment Education Provided To: Caregiver Education Topics Provided Wound/Skin Impairment: Handouts: Other: wound care as ordered Methods: Explain/Verbal Responses: State content correctly Electronic Signature(s) Signed: 02/10/2017 4:34:27 PM By: Curtis Sites Entered By: Curtis Sites on 02/10/2017 12:24:17 [name] ([id number]) -------------------------------------------------------------------------------- Wound Assessment Details Patient Name: [name] Date of Service: 02/10/2017 10:15 AM Medical Record Patient Account Number: [id number] Number: Treating RN: Curtis Sites [birth year] (83 y.o. Other Clinician: Date of Birth/Sex: Female) Treating ROBSON, MICHAEL Primary Care Jazen Spraggins: Vonita Moss Fradel Baldonado/Extender: G Referring Cambrey Lupi: Vonita Moss Weeks in Treatment: 4 Wound Status Wound Number: 1 Primary Etiology: Pressure Ulcer Wound Location: Coccyx - Midline Wound Status: Open Wounding Event: Pressure Injury Comorbid History: Anemia, Hypertension, Dementia Date Acquired: 12/01/2016 Weeks Of Treatment: 4 Clustered Wound: No Photos Photo Uploaded By: Curtis Sites on 02/10/2017 15:26:26 Wound Measurements Length: (cm) 2.7 % Reduction in  A Width: (cm) 2.6 % Reduction in V Depth: (cm) 0.5 Epithelializatio Area: (cm) 5.513 Tunneling: Volume: (cm) 2.757 Undermining: Starting Posi Ending Positi Maximum Dista rea: -32.7% olume: -65.9% n: None No Yes tion (o'clock): 1 on (o'clock): 5 nce: (cm) 0.5 Wound Description Classification: Category/Stage III Foul Odor After Wound Margin: Flat and Intact Slough/Fibrino Exudate Amount: Large Exudate Type: Serous Exudate Color: amber KONNOR, JORDEN ([id number]) Cleansing: No Yes Wound Bed Granulation Amount: Large (67-100%) Exposed Structure Granulation Quality: Red Fascia Exposed: No Necrotic Amount: Small (1-33%) Fat Layer (Subcutaneous Tissue) Exposed: Yes Necrotic Quality: Adherent Slough Tendon Exposed: No Muscle Exposed: No Joint Exposed: No Bone Exposed: No Periwound Skin Texture Texture Color No Abnormalities Noted: No No Abnormalities Noted: No Callus: No Atrophie Blanche: No Crepitus: No Cyanosis: No Excoriation: No Ecchymosis: No Induration: Yes Erythema: Yes Rash: No Erythema Location: Circumferential Scarring: No Hemosiderin Staining: No Mottled: No Moisture Pallor: No No Abnormalities Noted: No Rubor: No Dry / Scaly: No Maceration: No Temperature / Pain Temperature: No Abnormality Tenderness on Palpation: Yes Wound Preparation Ulcer Cleansing: Rinsed/Irrigated with Saline Topical Anesthetic Applied: Other: lidocaine 4%, Treatment Notes Wound #1 (Midline Coccyx) 1. Cleansed with: Clean wound with Normal Saline 2. Anesthetic Topical Lidocaine 4% cream to wound bed prior to debridement 4. Dressing Applied: Other dressing (specify in notes) 5. Secondary Dressing Applied Bordered Foam Dressing Dry Gauze Notes ColActive Plus Ag Electronic Signature(s) Signed: 02/10/2017 4:34:27 PM By: Curtis Sites Entered By: Curtis Sites on 02/10/2017 10:41:06 [name] ([id number]) [name]  ([id number]) -------------------------------------------------------------------------------- Vitals Details Patient Name: [name] Date of Service: 02/10/2017 10:15 AM Medical Record Patient Account Number: [id number] Number: Treating RN: Curtis Sites 03-25-1923 (83 y.o. Other Clinician: Date of Birth/Sex: Female) Treating ROBSON, MICHAEL Primary Care Parth Mccormac: Vonita Moss Arelly Whittenberg/Extender: G Referring Zeyad Delaguila: Vonita Moss Weeks in Treatment: 4 Vital Signs Time Taken: 10:22 Temperature (F): 98.4 Pulse (bpm): 92 Respiratory Rate (breaths/min): 16  Blood Pressure (mmHg): 98/64 Reference Range: 80 - 120 mg / dl Electronic Signature(s) Signed: 02/10/2017 4:34:27 PM By: Curtis Sites Entered By: Curtis Sites on 02/10/2017 10:22:25

## 2017-02-16 ENCOUNTER — Telehealth: Payer: Self-pay

## 2017-02-16 NOTE — Telephone Encounter (Signed)
They called to let [name] know they will be d/c'ing her skilled nursing services for her wound care. The area is improving and between her going to wound care and the family managing it, they feel she no longer needs skilled nursing.

## 2017-02-24 ENCOUNTER — Encounter: Payer: Medicare Other | Admitting: Internal Medicine

## 2017-02-24 DIAGNOSIS — L89150 Pressure ulcer of sacral region, unstageable: Secondary | ICD-10-CM | POA: Diagnosis not present

## 2017-02-25 NOTE — Progress Notes (Addendum)
MISK, GALENTINE ([id number]) Visit Report for 02/24/2017 Chief Complaint Document Details Patient Name: [name], [name] Date of Service: 02/24/2017 10:15 AM Medical Record Patient Account Number: [id number] Number: Treating RN: Curtis Sites 12-Jun-1923 (83 y.o. Other Clinician: Date of Birth/Sex: Female) Treating Macenzie Burford Primary Care Provider: Vonita Moss Provider/Extender: G Referring Provider: Vonita Moss Weeks in Treatment: 6 Information Obtained from: Patient Chief Complaint 01/07/17; patient is here for review of a sacral pressure ulcer Electronic Signature(s) Signed: 02/24/2017 5:12:21 PM By: Baltazar Najjar MD Entered By: Baltazar Najjar on 02/24/2017 10:55:06 [name] ([id number]) -------------------------------------------------------------------------------- HPI Details Patient Name: [name] Date of Service: 02/24/2017 10:15 AM Medical Record Patient Account Number: [id number] Number: Treating RN: Curtis Sites 20-Apr-1923 (83 y.o. Other Clinician: Date of Birth/Sex: Female) Treating Xai Frerking Primary Care Provider: Vonita Moss Provider/Extender: G Referring Provider: Vonita Moss Weeks in Treatment: 6 History of Present Illness HPI Description: 01/07/17 this is a 83 year old woman admitted to the clinic today for review of a pressure ulcer on her lower sacrum. She is referred from her primary physician's office after being seen on 3/22 with a 3 cm pressure area. Her daughter and caretaker accompanied her today state that the area first became obvious about a month ago and his since deteriorated. They have recently got [name] a home health involved and have been using [name] to the wound. They have ordered a pressure relief surface for her mattress. They are turning her religiously. They state that she eats well and they've been forcing fluids on her. She is on a multivitamin. Looking through [facility]  point last albumin I see was 4.4 on 10/28. The patient has advanced parkinsonism which looks superficially like advanced Parkinson's disease although her daughter tells me she did not ever respond to Sinemet therefore this may have another pathology with signs of parkinsonism. However I think this is largely a mute point currently. She also has dementia and is nonambulatory. Since this started they have been keeping her in bed and turning her religiously every 2 hours. She lives at home in [place] with her husband with 24/7 care giving 01/13/17 santyl change qd. Still will require further debridement. continue santyl. 01/20/17; patient's wound actually looks some better less adherent necrotic surface. There is actually visible granulation. We're using [name] 01/27/17; better-looking surface but still a lot of necrotic tissue on the base of this wound. The periwound erythema is better than last week we are still using [name]. Her daughter tells [place] that she is still having trouble with the pressure-relief mattress through medical modalities 02/03/18; I had the patient scheduled for a two week followup however her daughter brought her in early concerned for discoloration on 2 areas of the wound circumference. We have bee using santyl 02/10/17;Better looking surface to the wound. Rim appears better suggesting better offloading. Using santyl 02/24/17; change to Silver [employer] last time. Wound appears better. Electronic Signature(s) Signed: 02/24/2017 5:12:21 PM By: Baltazar Najjar MD Entered By: Baltazar Najjar on 02/24/2017 10:55:40 [name] ([id number]) -------------------------------------------------------------------------------- Physical Exam Details Patient Name: [name] Date of Service: 02/24/2017 10:15 AM Medical Record Patient Account Number: [id number] Number: Treating RN: Curtis Sites [birth year] (83 y.o. Other Clinician: Date of Birth/Sex: Female) Treating  Ardell Aaronson Primary Care Provider: Vonita Moss Provider/Extender: G Referring Provider: Vonita Moss Weeks in Treatment: 6 Constitutional Sitting or standing Blood Pressure is within target range for patient.. Pulse regular and within target range for patient.Marland Kitchen Respirations regular, non-labored and within  target range.. Temperature is normal and within the target range for the patient.Marland Kitchen appears in no distress. Notes Wound exam; the patient has healthy-looking granulation with some advancing epithelialization. No debridement was required. Electronic Signature(s) Signed: 02/24/2017 5:12:21 PM By: Baltazar Najjar MD Entered By: Baltazar Najjar on 02/24/2017 10:56:31 [name] ([id number]) -------------------------------------------------------------------------------- Physician Orders Details Patient Name: [name] Date of Service: 02/24/2017 10:15 AM Medical Record Patient Account Number: [id number] Number: Treating RN: Curtis Sites 1922-10-02 (83 y.o. Other Clinician: Date of Birth/Sex: Female) Treating Verle Wheeling Primary Care Provider: Vonita Moss Provider/Extender: G Referring Provider: Vonita Moss Weeks in Treatment: 6 Verbal / Phone Orders: No Diagnosis Coding Wound Cleansing Wound #1 Midline Coccyx o Clean wound with Normal Saline. o May Shower, gently pat wound dry prior to applying new dressing. Anesthetic Wound #1 Midline Coccyx o Topical Lidocaine 4% cream applied to wound bed prior to debridement - in clinic Skin Barriers/Peri-Wound Care Wound #1 Midline Coccyx o Skin Prep Primary Wound Dressing Wound #1 Midline Coccyx o Prisma Ag Secondary Dressing Wound #1 Midline Coccyx o Dry Gauze o Boardered Foam Dressing - silicon bordered dressing due to skin breakdown Dressing Change Frequency Wound #1 Midline Coccyx o Change dressing every day. Follow-up Appointments Wound #1 Midline Coccyx o Return  Appointment in 2 weeks. Off-Loading Wound #1 Midline Coccyx [name], [name]. ([id number]) o Roho cushion for wheelchair - [facility] please order roho cushion or gel cushion for patients wheelchair - whichever she qualifies for o Turn and reposition every 2 hours Additional Orders / Instructions Wound #1 Midline Coccyx o Increase protein intake. - please add protein supplements to patients diet o Other: - please add vitamin A, vitamin C and zinc supplements to patients diet Electronic Signature(s) Signed: 02/24/2017 5:12:21 PM By: Baltazar Najjar MD Signed: 02/24/2017 5:22:44 PM By: Curtis Sites Entered By: Curtis Sites on 02/24/2017 10:40:11 [name] ([id number]) -------------------------------------------------------------------------------- Problem List Details Patient Name: [name] Date of Service: 02/24/2017 10:15 AM Medical Record Patient Account Number: [id number] Number: Treating RN: Curtis Sites 04-21-1923 (83 y.o. Other Clinician: Date of Birth/Sex: Female) Treating Caidan Hubbert Primary Care Provider: Vonita Moss Provider/Extender: G Referring Provider: Vonita Moss Weeks in Treatment: 6 Active Problems ICD-10 Encounter Code Description Active Date Diagnosis L89.150 Pressure ulcer of sacral region, unstageable 01/07/2017 Yes G20 Parkinson's disease 01/07/2017 Yes Inactive Problems Resolved Problems Electronic Signature(s) Signed: 02/24/2017 5:12:21 PM By: Baltazar Najjar MD Entered By: Baltazar Najjar on 02/24/2017 10:54:34 [name] ([id number]) -------------------------------------------------------------------------------- Progress Note Details Patient Name: [name] Date of Service: 02/24/2017 10:15 AM Medical Record Patient Account Number: [id number] Number: Treating RN: Curtis Sites 07-28-1923 (83 y.o. Other Clinician: Date of Birth/Sex: Female) Treating Ioannis Schuh Primary Care  Provider: Vonita Moss Provider/Extender: G Referring Provider: Vonita Moss Weeks in Treatment: 6 Subjective Chief Complaint Information obtained from Patient 01/07/17; patient is here for review of a sacral pressure ulcer History of Present Illness (HPI) 01/07/17 this is a 83 year old woman admitted to the clinic today for review of a pressure ulcer on her lower sacrum. She is referred from her primary physician's office after being seen on 3/22 with a 3 cm pressure area. Her daughter and caretaker accompanied her today state that the area first became obvious about a month ago and his since deteriorated. They have recently got [name] a home health involved and have been using [name] to the wound. They have ordered a pressure relief surface for her mattress. They are turning her religiously.  They state that she eats well and they've been forcing fluids on her. She is on a multivitamin. [facility] last albumin I see was 4.4 on 10/28. The patient has advanced parkinsonism which looks superficially like advanced Parkinson's disease although her daughter tells me she did not ever respond to Sinemet therefore this may have another pathology with signs of parkinsonism. However I think this is largely a mute point currently. She also has dementia and is nonambulatory. Since this started they have been keeping her in bed and turning her religiously every 2 hours. She lives at home in [place] with her husband with 24/7 care giving 01/13/17 santyl change qd. Still will require further debridement. continue santyl. 01/20/17; patient's wound actually looks some better less adherent necrotic surface. There is actually visible granulation. We're using [name] 01/27/17; better-looking surface but still a lot of necrotic tissue on the base of this wound. The periwound erythema is better than last week we are still using [name]. Her daughter tells us that she is still  having trouble with the pressure-relief mattress through medical modalities 02/03/18; I had the patient scheduled for a two week followup however her daughter brought her in early concerned for discoloration on 2 areas of the wound circumference. We have bee using santyl 02/10/17;Better looking surface to the wound. Rim appears better suggesting better offloading. Using santyl 02/24/17; change to Silver Collegen last time. Wound appears better. Objective Tina MainlandSHAW, [name] ([id number]) Constitutional Sitting or standing Blood Pressure is within target range for patient.. Pulse regular and within target range for patient.Marland Kitchen. Respirations regular, non-labored and within target range.. Temperature is normal and within the target range for the patient.Marland Kitchen. appears in no distress. Vitals Time Taken: 10:13 AM, Temperature: 99.1 F, Pulse: 92 bpm, Respiratory Rate: 16 breaths/min, Blood Pressure: 118/74 mmHg. General Notes: Wound exam; the patient has healthy-looking granulation with some advancing epithelialization. No debridement was required. Integumentary (Hair, Skin) Wound #1 status is Open. Original cause of wound was Pressure Injury. The wound is located on the Midline Coccyx. The wound measures 2.3cm length x 2.1cm width x 0.3cm depth; 3.793cm^2 area and 1.138cm^3 volume. There is Fat Layer (Subcutaneous Tissue) Exposed exposed. There is no tunneling or undermining noted. There is a medium amount of serous drainage noted. The wound margin is flat and intact. There is large (67-100%) red granulation within the wound bed. There is a small (1-33%) amount of necrotic tissue within the wound bed including Adherent Slough. The periwound skin appearance exhibited: Induration, Erythema. The periwound skin appearance did not exhibit: Callus, Crepitus, Excoriation, Rash, Scarring, Dry/Scaly, Maceration, Atrophie Blanche, Cyanosis, Ecchymosis, Hemosiderin Staining, Mottled, Pallor, Rubor. The surrounding wound  skin color is noted with erythema which is circumferential. Periwound temperature was noted as No Abnormality. The periwound has tenderness on palpation. Assessment Active Problems ICD-10 L89.150 - Pressure ulcer of sacral region, unstageable G20 - Parkinson's disease Plan Wound Cleansing: Wound #1 Midline Coccyx: Clean wound with Normal Saline. May Shower, gently pat wound dry prior to applying new dressing. Anesthetic: Wound #1 Midline Coccyx: Tina MainlandSHAW, [name] ([id number]) Topical Lidocaine 4% cream applied to wound bed prior to debridement - in clinic Skin Barriers/Peri-Wound Care: Wound #1 Midline Coccyx: Skin Prep Primary Wound Dressing: Wound #1 Midline Coccyx: Prisma Ag Secondary Dressing: Wound #1 Midline Coccyx: Dry Gauze Boardered Foam Dressing - silicon bordered dressing due to skin breakdown Dressing Change Frequency: Wound #1 Midline Coccyx: Change dressing every day. Follow-up Appointments: Wound #1 Midline Coccyx: Return Appointment in  2 weeks. Off-Loading: Wound #1 Midline Coccyx: Roho cushion for wheelchair - [facility] please order roho cushion or gel cushion for patients wheelchair - whichever she qualifies for Turn and reposition every 2 hours Additional Orders / Instructions: Wound #1 Midline Coccyx: Increase protein intake. - please add protein supplements to patients diet Other: - please add vitamin A, vitamin C and zinc supplements to patients diet #1 at this point we continue with Silver Collegen, dry gauze, border foam. The wound looks like it's improved. #2 there is no evidence of surrounding infection #3 no debridement was required #4 they were discharged by home health since family have been taught how to do the dressings therefore we will order [employer] and through a wound products supplier Electronic Signature(s) Signed: 02/27/2017 10:27:26 AM By: Elliot Gurney, BSN, RN, CWS, Kim RN, BSN Signed: 03/06/2017 5:56:02 PM By: Baltazar Najjar MD Previous  Signature: 02/24/2017 5:12:21 PM Version By: Baltazar Najjar MD Entered By: Elliot Gurney, BSN, RN, CWS, Kim on 02/27/2017 10:27:26 [name] ([id number]) [name], [name] ([id number]) -------------------------------------------------------------------------------- SuperBill Details Patient Name: [name] Date of Service: 02/24/2017 Medical Record Patient Account Number: [id number] Number: Treating RN: Curtis Sites Dec 13, 1922 (83 y.o. Other Clinician: Date of Birth/Sex: Female) Treating Shakara Tweedy Primary Care Provider: Vonita Moss Provider/Extender: G Referring Provider: Vonita Moss Weeks in Treatment: 6 Diagnosis Coding ICD-10 Codes Code Description L89.150 Pressure ulcer of sacral region, unstageable G20 Parkinson's disease Facility Procedures CPT4 Code: [id number] Description: 99213 - WOUND CARE VISIT-LEV 3 EST PT Modifier: Quantity: 1 Physician Procedures CPT4 Code: [id number] Description: 46962 - WC PHYS LEVEL 2 - EST PT ICD-10 Description Diagnosis L89.150 Pressure ulcer of sacral region, unstageable Modifier: Quantity: 1 Electronic Signature(s) Signed: 02/24/2017 11:41:29 AM By: Curtis Sites Signed: 02/24/2017 5:12:21 PM By: Baltazar Najjar MD Entered By: Curtis Sites on 02/24/2017 11:41:28

## 2017-02-25 NOTE — Progress Notes (Signed)
Tina MainlandSHAW, Kevona W. ([id number]) Visit Report for 02/24/2017 Arrival Information Details Patient Name: Tina MainlandSHAW, [name] Date of Service: 02/24/2017 10:15 AM Medical Record Patient Account Number: [id number] Number: Treating RN: Curtis SitesDorthy, Joanna Jan 23, 1923 (83 y.o. Other Clinician: Date of Birth/Sex: Female) Treating ROBSON, MICHAEL Primary Care Zilda No: Vonita MossRISSMAN, MARK Fonnie Crookshanks/Extender: G Referring Nathen Balaban: Vonita MossRISSMAN, MARK Weeks in Treatment: 6 Visit Information History Since Last Visit Added or deleted any medications: No Patient Arrived: Wheel Chair Any new allergies or adverse No reactions: Arrival Time: 10:13 Had a fall or experienced change in No Accompanied By: dtr activities of daily living that may Transfer Assistance: Manual affect Patient Identification Verified: Yes risk of falls: Secondary Verification Process Yes Signs or symptoms of abuse/neglect No Completed: since last visito Patient Requires Transmission-Based No Hospitalized since last visit: No Precautions: Has Dressing in Place as Yes Patient Has Alerts: No Prescribed: Pain Present Now: Unable to Respond Electronic Signature(s) Signed: 02/24/2017 5:22:44 PM By: Curtis Sitesorthy, Joanna Entered By: Curtis Sitesorthy, Joanna on 02/24/2017 10:13:22 Tina MainlandSHAW, [name] ([id number]) -------------------------------------------------------------------------------- Clinic Level of Care Assessment Details Patient Name: Tina MainlandSHAW, [name] Date of Service: 02/24/2017 10:15 AM Medical Record Patient Account Number: [id number] Number: Treating RN: Curtis Sitesorthy, Joanna Jan 23, 1923 (83 y.o. Other Clinician: Date of Birth/Sex: Female) Treating ROBSON, MICHAEL Primary Care Jahmere Bramel: Vonita MossRISSMAN, MARK Miner Koral/Extender: G Referring Kempton Milne: Vonita MossRISSMAN, MARK Weeks in Treatment: 6 Clinic Level of Care Assessment Items TOOL 4 Quantity Score []  - Use when only an EandM is performed on FOLLOW-UP visit 0 ASSESSMENTS - Nursing  Assessment / Reassessment X - Reassessment of Co-morbidities (includes updates in patient status) 1 10 X - Reassessment of Adherence to Treatment Plan 1 5 ASSESSMENTS - Wound and Skin Assessment / Reassessment X - Simple Wound Assessment / Reassessment - one wound 1 5 []  - Complex Wound Assessment / Reassessment - multiple wounds 0 []  - Dermatologic / Skin Assessment (not related to wound area) 0 ASSESSMENTS - Focused Assessment []  - Circumferential Edema Measurements - multi extremities 0 []  - Nutritional Assessment / Counseling / Intervention 0 []  - Lower Extremity Assessment (monofilament, tuning fork, pulses) 0 []  - Peripheral Arterial Disease Assessment (using hand held doppler) 0 ASSESSMENTS - Ostomy and/or Continence Assessment and Care X - Incontinence Assessment and Management 1 10 []  - Ostomy Care Assessment and Management (repouching, etc.) 0 PROCESS - Coordination of Care X - Simple Patient / Family Education for ongoing care 1 15 []  - Complex (extensive) Patient / Family Education for ongoing care 0 []  - Staff obtains ChiropractorConsents, Records, Test Results / Process Orders 0 []  - Staff telephones HHA, Nursing Homes / Clarify orders / etc 0 Tina MainlandSHAW, [name] ([id number]) []  - Routine Transfer to another Facility (non-emergent condition) 0 []  - Routine Hospital Admission (non-emergent condition) 0 []  - New Admissions / Manufacturing engineernsurance Authorizations / Ordering NPWT, Apligraf, etc. 0 []  - Emergency Hospital Admission (emergent condition) 0 X - Simple Discharge Coordination 1 10 []  - Complex (extensive) Discharge Coordination 0 PROCESS - Special Needs []  - Pediatric / Minor Patient Management 0 []  - Isolation Patient Management 0 []  - Hearing / Language / Visual special needs 0 []  - Assessment of Community assistance (transportation, D/C planning, etc.) 0 X - Additional assistance / Altered mentation 1 15 []  - Support Surface(s) Assessment (bed, cushion, seat, etc.) 0 INTERVENTIONS -  Wound Cleansing / Measurement X - Simple Wound Cleansing - one wound 1 5 []  - Complex Wound Cleansing - multiple wounds 0 X - Wound Imaging (photographs - any number  of wounds) 1 5 []  - Wound Tracing (instead of photographs) 0 X - Simple Wound Measurement - one wound 1 5 []  - Complex Wound Measurement - multiple wounds 0 INTERVENTIONS - Wound Dressings X - Small Wound Dressing one or multiple wounds 1 10 []  - Medium Wound Dressing one or multiple wounds 0 []  - Large Wound Dressing one or multiple wounds 0 []  - Application of Medications - topical 0 []  - Application of Medications - injection 0 WINDI, TORO. ([id number]) INTERVENTIONS - Miscellaneous []  - External ear exam 0 []  - Specimen Collection (cultures, biopsies, blood, body fluids, etc.) 0 []  - Specimen(s) / Culture(s) sent or taken to Lab for analysis 0 []  - Patient Transfer (multiple staff / Michiel Sites Lift / Similar devices) 0 []  - Simple Staple / Suture removal (25 or less) 0 []  - Complex Staple / Suture removal (26 or more) 0 []  - Hypo / Hyperglycemic Management (close monitor of Blood Glucose) 0 []  - Ankle / Brachial Index (ABI) - do not check if billed separately 0 X - Vital Signs 1 5 Has the patient been seen at the hospital within the last three years: Yes Total Score: 100 Level Of Care: New/Established - Level 3 Electronic Signature(s) Signed: 02/24/2017 5:22:44 PM By: Curtis Sites Entered By: Curtis Sites on 02/24/2017 11:41:11 [name] ([id number]) -------------------------------------------------------------------------------- Encounter Discharge Information Details Patient Name: [name] Date of Service: 02/24/2017 10:15 AM Medical Record Patient Account Number: [id number] Number: Treating RN: Curtis Sites 1923/08/03 (83 y.o. Other Clinician: Date of Birth/Sex: Female) Treating ROBSON, MICHAEL Primary Care [name] Shreeve: Vonita Moss [name]/Extender: G Referring Jalia Zuniga:  Vonita Moss Weeks in Treatment: 6 Encounter Discharge Information Items Discharge Pain Level: 0 Discharge Condition: Stable Ambulatory Status: Wheelchair Discharge Destination: Home Transportation: Private Auto Accompanied By: dtr Schedule Follow-up Appointment: Yes Medication Reconciliation completed and provided to Patient/Care No Wilver Tignor: Provided on Clinical Summary of Care: 02/24/2017 Form Type Recipient Paper Patient ES Electronic Signature(s) Signed: 02/24/2017 10:52:39 AM By: Gwenlyn Perking Entered By: Gwenlyn Perking on 02/24/2017 10:52:39 [name] ([id number]) -------------------------------------------------------------------------------- Multi Wound Chart Details Patient Name: [name] Date of Service: 02/24/2017 10:15 AM Medical Record Patient Account Number: [id number] Number: Treating RN: Curtis Sites 1922-12-28 (83 y.o. Other Clinician: Date of Birth/Sex: Female) Treating ROBSON, MICHAEL Primary Care Debrina Kizer: Vonita Moss Gerren Hoffmeier/Extender: G Referring Elliett Guarisco: Vonita Moss Weeks in Treatment: 6 Vital Signs Height(in): Pulse(bpm): 92 Weight(lbs): Blood Pressure 118/74 (mmHg): Body Mass Index(BMI): Temperature(F): 99.1 Respiratory Rate 16 (breaths/min): Photos: [1:No Photos] [N/A:N/A] Wound Location: [1:Coccyx - Midline] [N/A:N/A] Wounding Event: [1:Pressure Injury] [N/A:N/A] Primary Etiology: [1:Pressure Ulcer] [N/A:N/A] Comorbid History: [1:Anemia, Hypertension, Dementia] [N/A:N/A] Date Acquired: [1:12/01/2016] [N/A:N/A] Weeks of Treatment: [1:6] [N/A:N/A] Wound Status: [1:Open] [N/A:N/A] Measurements L x W x D 2.3x2.1x0.3 [N/A:N/A] (cm) Area (cm) : [1:3.793] [N/A:N/A] Volume (cm) : [1:1.138] [N/A:N/A] % Reduction in Area: [1:8.70%] [N/A:N/A] % Reduction in Volume: 31.50% [N/A:N/A] Classification: [1:Category/Stage III] [N/A:N/A] Exudate Amount: [1:Large] [N/A:N/A] Exudate Type: [1:Serous]  [N/A:N/A] Exudate Color: [1:amber] [N/A:N/A] Wound Margin: [1:Flat and Intact] [N/A:N/A] Granulation Amount: [1:Large (67-100%)] [N/A:N/A] Granulation Quality: [1:Red] [N/A:N/A] Necrotic Amount: [1:Small (1-33%)] [N/A:N/A] Exposed Structures: [1:Fat Layer (Subcutaneous Tissue) Exposed: Yes Fascia: No Tendon: No Muscle: No] [N/A:N/A] Joint: No Bone: No Epithelialization: None N/A N/A Periwound Skin Texture: Induration: Yes N/A N/A Excoriation: No Callus: No Crepitus: No Rash: No Scarring: No Periwound Skin Maceration: No N/A N/A Moisture: Dry/Scaly: No Periwound Skin Color: Erythema: Yes N/A N/A Atrophie Blanche: No Cyanosis: No Ecchymosis: No  Hemosiderin Staining: No Mottled: No Pallor: No Rubor: No Erythema Location: Circumferential N/A N/A Temperature: No Abnormality N/A N/A Tenderness on Yes N/A N/A Palpation: Wound Preparation: Ulcer Cleansing: N/A N/A Rinsed/Irrigated with Saline Topical Anesthetic Applied: Other: lidocaine 4% Treatment Notes Electronic Signature(s) Signed: 02/24/2017 5:12:21 PM By: Baltazar Najjar MD Entered By: Baltazar Najjar on 02/24/2017 10:54:43 [name] ([id number]) -------------------------------------------------------------------------------- Multi-Disciplinary Care Plan Details Patient Name: [name] Date of Service: 02/24/2017 10:15 AM Medical Record Patient Account Number: [id number] Number: Treating RN: Curtis Sites 08/15/1923 (83 y.o. Other Clinician: Date of Birth/Sex: Female) Treating ROBSON, MICHAEL Primary Care Ameera Tigue: Vonita Moss Coila Wardell/Extender: G Referring Graig Hessling: Vonita Moss Weeks in Treatment: 6 Active Inactive ` Abuse / Safety / Falls / Self Care Management Nursing Diagnoses: Impaired physical mobility Potential for falls Goals: Patient will remain injury free Date Initiated: 01/07/2017 Target Resolution Date: 04/03/2017 Goal Status: Active Interventions: Assess fall  risk on admission and as needed Notes: ` Nutrition Nursing Diagnoses: Potential for alteratiion in Nutrition/Potential for imbalanced nutrition Goals: Patient/caregiver agrees to and verbalizes understanding of need to use nutritional supplements and/or vitamins as prescribed Date Initiated: 01/07/2017 Target Resolution Date: 04/03/2017 Goal Status: Active Interventions: Assess patient nutrition upon admission and as needed per policy Notes: ` Orientation to the Wound Care Program [name], [name] ([id number]) Nursing Diagnoses: Knowledge deficit related to the wound healing center program Goals: Patient/caregiver will verbalize understanding of the Wound Healing Center Program Date Initiated: 01/07/2017 Target Resolution Date: 04/03/2017 Goal Status: Active Interventions: Provide education on orientation to the wound center Notes: ` Pressure Nursing Diagnoses: Knowledge deficit related to causes and risk factors for pressure ulcer development Goals: Patient will remain free from development of additional pressure ulcers Date Initiated: 01/07/2017 Target Resolution Date: 04/03/2017 Goal Status: Active Interventions: Assess potential for pressure ulcer upon admission and as needed Notes: ` Wound/Skin Impairment Nursing Diagnoses: Impaired tissue integrity Goals: Patient/caregiver will verbalize understanding of skin care regimen Date Initiated: 01/07/2017 Target Resolution Date: 04/03/2017 Goal Status: Active Ulcer/skin breakdown will have a volume reduction of 30% by week 4 Date Initiated: 01/07/2017 Target Resolution Date: 04/03/2017 Goal Status: Active Ulcer/skin breakdown will have a volume reduction of 50% by week 8 Date Initiated: 01/07/2017 Target Resolution Date: 04/03/2017 Goal Status: Active Ulcer/skin breakdown will have a volume reduction of 80% by week 12 [name], [name] ([id number]) Date Initiated: 01/07/2017 Target Resolution Date: 04/03/2017 Goal Status:  Active Ulcer/skin breakdown will heal within 14 weeks Date Initiated: 01/07/2017 Target Resolution Date: 04/03/2017 Goal Status: Active Interventions: Assess patient/caregiver ability to obtain necessary supplies Assess patient/caregiver ability to perform ulcer/skin care regimen upon admission and as needed Assess ulceration(s) every visit Notes: Electronic Signature(s) Signed: 02/24/2017 5:22:44 PM By: Curtis Sites Entered By: Curtis Sites on 02/24/2017 10:29:09 [name] ([id number]) -------------------------------------------------------------------------------- Pain Assessment Details Patient Name: [name] Date of Service: 02/24/2017 10:15 AM Medical Record Patient Account Number: [id number] Number: Treating RN: Curtis Sites [birth year] (83 y.o. Other Clinician: Date of Birth/Sex: Female) Treating ROBSON, MICHAEL Primary Care Roberth Berling: Vonita Moss Yannick Steuber/Extender: G Referring Therasa Lorenzi: Vonita Moss Weeks in Treatment: 6 Active Problems Location of Pain Severity and Description of Pain Patient Has Paino Patient Unable to Respond Site Locations Pain Management and Medication Current Pain Management: Electronic Signature(s) Signed: 02/24/2017 5:22:44 PM By: Curtis Sites Entered By: Curtis Sites on 02/24/2017 10:13:29 [name] ([id number]) -------------------------------------------------------------------------------- Patient/Caregiver Education Details Patient Name: [name] Date of Service: 02/24/2017 10:15 AM Medical Record Patient Account Number: [id number]  [id number] Number: Treating RN: Curtis Sites 19-Feb-1923 (83 y.o. Other Clinician: Date of Birth/Gender: Female) Treating ROBSON, MICHAEL Primary Care Physician: Vonita Moss Physician/Extender: G Referring Physician: Veverly Fells in Treatment: 6 Education Assessment Education Provided To: Caregiver Education Topics Provided Wound/Skin  Impairment: Handouts: Other: wound care as ordered Methods: Demonstration, Explain/Verbal Responses: State content correctly Electronic Signature(s) Signed: 02/24/2017 5:22:44 PM By: Curtis Sites Entered By: Curtis Sites on 02/24/2017 10:38:54 [name] ([id number]) -------------------------------------------------------------------------------- Wound Assessment Details Patient Name: [name] Date of Service: 02/24/2017 10:15 AM Medical Record Patient Account Number: [id number] Number: Treating RN: Curtis Sites [birth year] (83 y.o. Other Clinician: Date of Birth/Sex: Female) Treating ROBSON, MICHAEL Primary Care Ayron Fillinger: Vonita Moss Farid Grigorian/Extender: G Referring Jag Lenz: Vonita Moss Weeks in Treatment: 6 Wound Status Wound Number: 1 Primary Etiology: Pressure Ulcer Wound Location: Coccyx - Midline Wound Status: Open Wounding Event: Pressure Injury Comorbid History: Anemia, Hypertension, Dementia Date Acquired: 12/01/2016 Weeks Of Treatment: 6 Clustered Wound: No Photos Wound Measurements Length: (cm) 2.3 % Reduction in Area: Width: (cm) 2.1 % Reduction in Volum Depth: (cm) 0.3 Epithelialization: Area: (cm) 3.793 Tunneling: Volume: (cm) 1.138 Undermining: 8.7% e: 31.5% None No No Wound Description Classification: Category/Stage III Foul Odor After Clea Wound Margin: Flat and Intact Slough/Fibrino Exudate Amount: Medium Exudate Type: Serous Exudate Color: amber nsing: No Yes Wound Bed Granulation Amount: Large (67-100%) Exposed Structure Granulation Quality: Red Fascia Exposed: No Necrotic Amount: Small (1-33%) Fat Layer (Subcutaneous Tissue) Exposed: Yes LAKHIA, GENGLER ([id number]) Necrotic Quality: Adherent Slough Tendon Exposed: No Muscle Exposed: No Joint Exposed: No Bone Exposed: No Periwound Skin Texture Texture Color No Abnormalities Noted: No No Abnormalities Noted: No Callus: No Atrophie Blanche:  No Crepitus: No Cyanosis: No Excoriation: No Ecchymosis: No Induration: Yes Erythema: Yes Rash: No Erythema Location: Circumferential Scarring: No Hemosiderin Staining: No Mottled: No Moisture Pallor: No No Abnormalities Noted: No Rubor: No Dry / Scaly: No Maceration: No Temperature / Pain Temperature: No Abnormality Tenderness on Palpation: Yes Wound Preparation Ulcer Cleansing: Rinsed/Irrigated with Saline Topical Anesthetic Applied: Other: lidocaine 4%, Treatment Notes Wound #1 (Midline Coccyx) 1. Cleansed with: Clean wound with Normal Saline 2. Anesthetic Topical Lidocaine 4% cream to wound bed prior to debridement 4. Dressing Applied: Prisma Ag 5. Secondary Dressing Applied Bordered Foam Dressing Dry Gauze Electronic Signature(s) Signed: 02/24/2017 3:15:21 PM By: Curtis Sites Entered By: Curtis Sites on 02/24/2017 15:15:20 [name] ([id number]) -------------------------------------------------------------------------------- Vitals Details Patient Name: [name] Date of Service: 02/24/2017 10:15 AM Medical Record Patient Account Number: [id number] Number: Treating RN: Curtis Sites 1923-01-15 (83 y.o. Other Clinician: Date of Birth/Sex: Female) Treating ROBSON, MICHAEL Primary Care Fred Franzen: Vonita Moss Ferol Laiche/Extender: G Referring Milaina Sher: Vonita Moss Weeks in Treatment: 6 Vital Signs Time Taken: 10:13 Temperature (F): 99.1 Pulse (bpm): 92 Respiratory Rate (breaths/min): 16 Blood Pressure (mmHg): 118/74 Reference Range: 80 - 120 mg / dl Electronic Signature(s) Signed: 02/24/2017 5:22:44 PM By: Curtis Sites Entered By: Curtis Sites on 02/24/2017 10:17:13

## 2017-03-10 ENCOUNTER — Encounter: Payer: Medicare Other | Attending: Internal Medicine | Admitting: Internal Medicine

## 2017-03-10 DIAGNOSIS — F0390 Unspecified dementia without behavioral disturbance: Secondary | ICD-10-CM | POA: Insufficient documentation

## 2017-03-10 DIAGNOSIS — L89150 Pressure ulcer of sacral region, unstageable: Secondary | ICD-10-CM | POA: Diagnosis present

## 2017-03-10 DIAGNOSIS — G20 Parkinson's disease: Secondary | ICD-10-CM | POA: Insufficient documentation

## 2017-03-11 NOTE — Progress Notes (Signed)
DEMONICA, FARREY ([id number]) Visit Report for 03/10/2017 Chief Complaint Document Details Patient Name: [name], [name] Date of Service: 03/10/2017 11:00 AM Medical Record Patient Account Number: [id number] Number: Treating RN: Curtis Sites April 13, 1923 (83 y.o. Other Clinician: Date of Birth/Sex: Female) Treating ROBSON, MICHAEL Primary Care Provider: Vonita Moss Provider/Extender: G Referring Provider: Vonita Moss Weeks in Treatment: 8 Information Obtained from: Patient Chief Complaint 01/07/17; patient is here for review of a sacral pressure ulcer Electronic Signature(s) Signed: 03/10/2017 6:01:27 PM By: Baltazar Najjar MD Entered By: Baltazar Najjar on 03/10/2017 15:35:14 [name] ([id number]) -------------------------------------------------------------------------------- HPI Details Patient Name: [name] Date of Service: 03/10/2017 11:00 AM Medical Record Patient Account Number: [id number] Number: Treating RN: Curtis Sites [birth year] (83 y.o. Other Clinician: Date of Birth/Sex: Female) Treating ROBSON, MICHAEL Primary Care Provider: Vonita Moss Provider/Extender: G Referring Provider: Vonita Moss Weeks in Treatment: 8 History of Present Illness HPI Description: 01/07/17 this is a 83 year old woman admitted to the clinic today for review of a pressure ulcer on her lower sacrum. She is referred from her primary physician's office after being seen on 3/22 with a 3 cm pressure area. Her daughter and caretaker accompanied her today state that the area first became obvious about a month ago and his since deteriorated. They have recently got [name] home health involved and have been using [name] to the wound. They have ordered a pressure relief surface for her mattress. They are turning her religiously. They state that she eats well and they've been forcing fluids on her. She is on a multivitamin. Looking through [facility]  point last albumin I see was 4.4 on 10/28. The patient has advanced parkinsonism which looks superficially like advanced Parkinson's disease although her daughter tells me she did not ever respond to Sinemet therefore this may have another pathology with signs of parkinsonism. However I think this is largely a mute point currently. She also has dementia and is nonambulatory. Since this started they have been keeping her in bed and turning her religiously every 2 hours. She lives at home in [place] with her husband with 24/7 care giving 01/13/17 santyl change qd. Still will require further debridement. continue santyl. 01/20/17; patient's wound actually looks some better less adherent necrotic surface. There is actually visible granulation. We're using [name] 01/27/17; better-looking surface but still a lot of necrotic tissue on the base of this wound. The periwound erythema is better than last week we are still using [name]. Her daughter tells [place] that she is still having trouble with the pressure-relief mattress through medical modalities 02/03/18; I had the patient scheduled for a two week followup however her daughter brought her in early concerned for discoloration on 2 areas of the wound circumference. We have bee using santyl 02/10/17;Better looking surface to the wound. Rim appears better suggesting better offloading. Using santyl 02/24/17; change to Silver Collegen last time. Wound appears better. 03/10/17; still using silver collagen religious offloading. Intake is satisfactory per her daughter. Dimension slightly better Electronic Signature(s) Signed: 03/10/2017 6:01:27 PM By: Baltazar Najjar MD Entered By: Baltazar Najjar on 03/10/2017 15:36:32 [name] ([id number]) -------------------------------------------------------------------------------- Physical Exam Details Patient Name: [name] Date of Service: 03/10/2017 11:00 AM Medical Record Patient Account Number:  [id number] Number: Treating RN: Curtis Sites 1923/01/26 (83 y.o. Other Clinician: Date of Birth/Sex: Female) Treating ROBSON, MICHAEL Primary Care Provider: Vonita Moss Provider/Extender: G Referring Provider: Vonita Moss Weeks in Treatment: 8 Constitutional Sitting or standing Blood Pressure is within target  range for patient.. Pulse regular and within target range for patient.Marland Kitchen Respirations regular, non-labored and within target range.. Temperature is normal and within the target range for the patient.Marland Kitchen appears in no distress. Frail elderly patient. Respiratory Respiratory effort is easy and symmetric bilaterally. Rate is normal at rest and on room air.. Cardiovascular Heart rhythm and rate regular, without murmur or gallop. Appears to be well-hydrated. Psychiatric Appears at baseline.. Notes Wound exam; the patient is a healthy looking granulated wound. Still some raised edges of this on the left. I'm hopeful not to have to debride this. There is no evidence of surrounding infection Electronic Signature(s) Signed: 03/10/2017 6:01:27 PM By: Baltazar Najjar MD Entered By: Baltazar Najjar on 03/10/2017 15:40:10 [name] ([id number]) -------------------------------------------------------------------------------- Physician Orders Details Patient Name: [name] Date of Service: 03/10/2017 11:00 AM Medical Record Patient Account Number: [id number] Number: Treating RN: Curtis Sites [birth year] (83 y.o. Other Clinician: Date of Birth/Sex: Female) Treating ROBSON, MICHAEL Primary Care Provider: Vonita Moss Provider/Extender: G Referring Provider: Vonita Moss Weeks in Treatment: 8 Verbal / Phone Orders: No Diagnosis Coding Wound Cleansing Wound #1 Midline Coccyx o Clean wound with Normal Saline. o May Shower, gently pat wound dry prior to applying new dressing. Anesthetic Wound #1 Midline Coccyx o Topical Lidocaine 4%  cream applied to wound bed prior to debridement - in clinic Skin Barriers/Peri-Wound Care Wound #1 Midline Coccyx o Skin Prep Primary Wound Dressing Wound #1 Midline Coccyx o Prisma Ag Secondary Dressing Wound #1 Midline Coccyx o Dry Gauze o Boardered Foam Dressing - silicon bordered dressing due to skin breakdown Dressing Change Frequency Wound #1 Midline Coccyx o Change dressing every day. Follow-up Appointments Wound #1 Midline Coccyx o Return Appointment in 2 weeks. Off-Loading Wound #1 Midline Coccyx DULA, HAVLIK. ([id number]) o Roho cushion for wheelchair - [facility] please order roho cushion or gel cushion for patients wheelchair - whichever she qualifies for o Turn and reposition every 2 hours Additional Orders / Instructions Wound #1 Midline Coccyx o Increase protein intake. - please add protein supplements to patients diet o Other: - please add vitamin A, vitamin C and zinc supplements to patients diet Electronic Signature(s) Signed: 03/10/2017 5:15:33 PM By: Curtis Sites Signed: 03/10/2017 6:01:27 PM By: Baltazar Najjar MD Entered By: Curtis Sites on 03/10/2017 11:47:27 [name] ([id number]) -------------------------------------------------------------------------------- Problem List Details Patient Name: [name] Date of Service: 03/10/2017 11:00 AM Medical Record Patient Account Number: [id number] Number: Treating RN: Curtis Sites Jul 11, 1923 (83 y.o. Other Clinician: Date of Birth/Sex: Female) Treating ROBSON, MICHAEL Primary Care Provider: Vonita Moss Provider/Extender: G Referring Provider: Vonita Moss Weeks in Treatment: 8 Active Problems ICD-10 Encounter Code Description Active Date Diagnosis L89.150 Pressure ulcer of sacral region, unstageable 01/07/2017 Yes G20 Parkinson's disease 01/07/2017 Yes Inactive Problems Resolved Problems Electronic Signature(s) Signed: 03/10/2017 6:01:27 PM By:  Baltazar Najjar MD Entered By: Baltazar Najjar on 03/10/2017 15:34:55 [name] ([id number]) -------------------------------------------------------------------------------- Progress Note Details Patient Name: [name] Date of Service: 03/10/2017 11:00 AM Medical Record Patient Account Number: [id number] Number: Treating RN: Curtis Sites 1922-10-18 (83 y.o. Other Clinician: Date of Birth/Sex: Female) Treating ROBSON, MICHAEL Primary Care Provider: Vonita Moss Provider/Extender: G Referring Provider: Vonita Moss Weeks in Treatment: 8 Subjective Chief Complaint Information obtained from Patient 01/07/17; patient is here for review of a sacral pressure ulcer History of Present Illness (HPI) 01/07/17 this is a 83 year old woman admitted to the clinic today for review of a pressure ulcer on her lower sacrum.  She is referred from her primary physician's office after being seen on 3/22 with a 3 cm pressure area. Her daughter and caretaker accompanied her today state that the area first became obvious about a month ago and his since deteriorated. They have recently got [name] home health involved and have been using [name] to the wound. They have ordered a pressure relief surface for her mattress. They are turning her religiously. They state that she eats well and they've been forcing fluids on her. She is on a multivitamin. [facility] last albumin I see was 4.4 on 10/28. The patient has advanced parkinsonism which looks superficially like advanced Parkinson's disease although her daughter tells me she did not ever respond to Sinemet therefore this may have another pathology with signs of parkinsonism. However I think this is largely a mute point currently. She also has dementia and is nonambulatory. Since this started they have been keeping her in bed and turning her religiously every 2 hours. [place] at [place] in [place]  husband with 24/7 care giving 01/13/17 santyl change qd. Still will require further debridement. continue santyl. 01/20/17; patient's wound actually looks some better less adherent necrotic surface. There is actually visible granulation. We're using [name] 01/27/17; better-looking surface but still a lot of necrotic tissue on the base of this wound. The periwound erythema is better than last week we are still using [name]. Her daughter tells us that she is still having trouble with the pressure-relief mattress through medical modalities 02/03/18; I had the patient scheduled for a two week followup however her daughter brought her in early concerned for discoloration on 2 areas of the wound circumference. We have bee using santyl 02/10/17;Better looking surface to the wound. Rim appears better suggesting better offloading. Using santyl 02/24/17; change to Silver Collegen last time. Wound appears better. 03/10/17; still using silver collagen religious offloading. Intake is satisfactory per her daughter. Dimension slightly better Tina MainlandSHAW, Kelise W. ([id number]) Objective Constitutional Sitting or standing Blood Pressure is within target range for patient.. Pulse regular and within target range for patient.Marland Kitchen. Respirations regular, non-labored and within target range.. Temperature is normal and within the target range for the patient.Marland Kitchen. appears in no distress. Frail elderly patient. Vitals Time Taken: 11:02 AM, Temperature: 98.2 F, Pulse: 46 bpm, Respiratory Rate: 16 breaths/min, Blood Pressure: 118/44 mmHg. Respiratory Respiratory effort is easy and symmetric bilaterally. Rate is normal at rest and on room air.. Cardiovascular Heart rhythm and rate regular, without murmur or gallop. Appears to be well-hydrated. Psychiatric Appears at baseline.. General Notes: Wound exam; the patient is a healthy looking granulated wound. Still some raised edges of this on the left. I'm hopeful not to have to debride  this. There is no evidence of surrounding infection Integumentary (Hair, Skin) Wound #1 status is Open. Original cause of wound was Pressure Injury. The wound is located on the Midline Coccyx. The wound measures 1.7cm length x 1.8cm width x 0.3cm depth; 2.403cm^2 area and 0.721cm^3 volume. There is Fat Layer (Subcutaneous Tissue) Exposed exposed. There is no tunneling or undermining noted. There is a medium amount of serous drainage noted. The wound margin is flat and intact. There is large (67-100%) red granulation within the wound bed. There is a small (1-33%) amount of necrotic tissue within the wound bed including Adherent Slough. The periwound skin appearance exhibited: Induration, Erythema. The periwound skin appearance did not exhibit: Callus, Crepitus, Excoriation, Rash, Scarring, Dry/Scaly, Maceration, Atrophie Blanche, Cyanosis, Ecchymosis, Hemosiderin Staining, Mottled,  Pallor, Rubor. The surrounding wound skin color is noted with erythema which is circumferential. Periwound temperature was noted as No Abnormality. The periwound has tenderness on palpation. Assessment Active Problems ICD-10 L89.150 - Pressure ulcer of sacral region, unstageable G20 - Parkinson's disease SCHERYL, SANBORN. ([id number]) Plan Wound Cleansing: Wound #1 Midline Coccyx: Clean wound with Normal Saline. May Shower, gently pat wound dry prior to applying new dressing. Anesthetic: Wound #1 Midline Coccyx: Topical Lidocaine 4% cream applied to wound bed prior to debridement - in clinic Skin Barriers/Peri-Wound Care: Wound #1 Midline Coccyx: Skin Prep Primary Wound Dressing: Wound #1 Midline Coccyx: Prisma Ag Secondary Dressing: Wound #1 Midline Coccyx: Dry Gauze Boardered Foam Dressing - silicon bordered dressing due to skin breakdown Dressing Change Frequency: Wound #1 Midline Coccyx: Change dressing every day. Follow-up Appointments: Wound #1 Midline Coccyx: Return Appointment in 2  weeks. Off-Loading: Wound #1 Midline Coccyx: Roho cushion for wheelchair - [facility] please order roho cushion or gel cushion for patients wheelchair - whichever she qualifies for Turn and reposition every 2 hours Additional Orders / Instructions: Wound #1 Midline Coccyx: Increase protein intake. - please add protein supplements to patients diet Other: - please add vitamin A, vitamin C and zinc supplements to patients diet #1 continue with the Silver collagen-based dressings here. Return to clinic in 2 weeks Electronic Signature(s) Signed: 03/10/2017 6:01:27 PM By: Baltazar Najjar MD [name] ([id number]) Entered By: Baltazar Najjar on 03/10/2017 15:40:41 [name] ([id number]) -------------------------------------------------------------------------------- SuperBill Details Patient Name: [name] Date of Service: 03/10/2017 Medical Record Patient Account Number: [id number] Number: Treating RN: Curtis Sites September 17, 1923 (83 y.o. Other Clinician: Date of Birth/Sex: Female) Treating ROBSON, MICHAEL Primary Care Provider: Vonita Moss Provider/Extender: G Referring Provider: Vonita Moss Weeks in Treatment: 8 Diagnosis Coding ICD-10 Codes Code Description L89.150 Pressure ulcer of sacral region, unstageable G20 Parkinson's disease Facility Procedures CPT4 Code: 08657846 Description: 99213 - WOUND CARE VISIT-LEV 3 EST PT Modifier: Quantity: 1 Physician Procedures CPT4 Code: [id number] Description: 41324 - WC PHYS LEVEL 2 - EST PT ICD-10 Description Diagnosis L89.150 Pressure ulcer of sacral region, unstageable Modifier: Quantity: 1 Electronic Signature(s) Signed: 03/10/2017 6:01:27 PM By: Baltazar Najjar MD Entered By: Baltazar Najjar on 03/10/2017 15:40:59

## 2017-03-11 NOTE — Progress Notes (Signed)
AIYLA, BAUCOM ([id number]) Visit Report for 03/10/2017 Arrival Information Details Patient Name: [name], [name] Date of Service: 03/10/2017 11:00 AM Medical Record Patient Account Number: [id number] Number: Treating RN: Curtis Sites 10-20-1922 (83 y.o. Other Clinician: Date of Birth/Sex: Female) Treating ROBSON, MICHAEL Primary Care Rasheema Truluck: Vonita Moss Glora Hulgan/Extender: G Referring Margarethe Virgen: Vonita Moss Weeks in Treatment: 8 Visit Information History Since Last Visit Added or deleted any medications: No Patient Arrived: Wheel Chair Any new allergies or adverse No reactions: Arrival Time: 11:01 Had a fall or experienced change in No Accompanied By: cg and dtr activities of daily living that may Transfer Assistance: Manual affect Patient Identification Verified: Yes risk of falls: Secondary Verification Process Yes Signs or symptoms of abuse/neglect No Completed: since last visito Patient Requires Transmission-Based No Hospitalized since last visit: No Precautions: Has Dressing in Place as Yes Patient Has Alerts: No Prescribed: Pain Present Now: Unable to Respond Electronic Signature(s) Signed: 03/10/2017 5:15:33 PM By: Curtis Sites Entered By: Curtis Sites on 03/10/2017 11:02:11 [name] ([id number]) -------------------------------------------------------------------------------- Clinic Level of Care Assessment Details Patient Name: [name] Date of Service: 03/10/2017 11:00 AM Medical Record Patient Account Number: [id number] Number: Treating RN: Curtis Sites 1923/04/08 (83 y.o. Other Clinician: Date of Birth/Sex: Female) Treating ROBSON, MICHAEL Primary Care Nephi Savage: Vonita Moss Brysun Eschmann/Extender: G Referring Laurette Villescas: Vonita Moss Weeks in Treatment: 8 Clinic Level of Care Assessment Items TOOL 4 Quantity Score []  - Use when only an EandM is performed on FOLLOW-UP visit 0 ASSESSMENTS - Nursing  Assessment / Reassessment X - Reassessment of Co-morbidities (includes updates in patient status) 1 10 X - Reassessment of Adherence to Treatment Plan 1 5 ASSESSMENTS - Wound and Skin Assessment / Reassessment X - Simple Wound Assessment / Reassessment - one wound 1 5 []  - Complex Wound Assessment / Reassessment - multiple wounds 0 []  - Dermatologic / Skin Assessment (not related to wound area) 0 ASSESSMENTS - Focused Assessment []  - Circumferential Edema Measurements - multi extremities 0 []  - Nutritional Assessment / Counseling / Intervention 0 []  - Lower Extremity Assessment (monofilament, tuning fork, pulses) 0 []  - Peripheral Arterial Disease Assessment (using hand held doppler) 0 ASSESSMENTS - Ostomy and/or Continence Assessment and Care X - Incontinence Assessment and Management 1 10 []  - Ostomy Care Assessment and Management (repouching, etc.) 0 PROCESS - Coordination of Care X - Simple Patient / Family Education for ongoing care 1 15 []  - Complex (extensive) Patient / Family Education for ongoing care 0 []  - Staff obtains Chiropractor, Records, Test Results / Process Orders 0 []  - Staff telephones HHA, Nursing Homes / Clarify orders / etc 0 TEDRA, COPPERNOLL ([id number]) []  - Routine Transfer to another Facility (non-emergent condition) 0 []  - Routine Hospital Admission (non-emergent condition) 0 []  - New Admissions / Manufacturing engineer / Ordering NPWT, Apligraf, etc. 0 []  - Emergency Hospital Admission (emergent condition) 0 X - Simple Discharge Coordination 1 10 []  - Complex (extensive) Discharge Coordination 0 PROCESS - Special Needs []  - Pediatric / Minor Patient Management 0 []  - Isolation Patient Management 0 []  - Hearing / Language / Visual special needs 0 []  - Assessment of Community assistance (transportation, D/C planning, etc.) 0 X - Additional assistance / Altered mentation 1 15 []  - Support Surface(s) Assessment (bed, cushion, seat, etc.) 0 INTERVENTIONS -  Wound Cleansing / Measurement X - Simple Wound Cleansing - one wound 1 5 []  - Complex Wound Cleansing - multiple wounds 0 X - Wound Imaging (photographs -  any number of wounds) 1 5 []  - Wound Tracing (instead of photographs) 0 X - Simple Wound Measurement - one wound 1 5 []  - Complex Wound Measurement - multiple wounds 0 INTERVENTIONS - Wound Dressings X - Small Wound Dressing one or multiple wounds 1 10 []  - Medium Wound Dressing one or multiple wounds 0 []  - Large Wound Dressing one or multiple wounds 0 []  - Application of Medications - topical 0 []  - Application of Medications - injection 0 [name], [name] ([id number]) INTERVENTIONS - Miscellaneous []  - External ear exam 0 []  - Specimen Collection (cultures, biopsies, blood, body fluids, etc.) 0 []  - Specimen(s) / Culture(s) sent or taken to Lab for analysis 0 []  - Patient Transfer (multiple staff / Michiel SitesHoyer Lift / Similar devices) 0 []  - Simple Staple / Suture removal (25 or less) 0 []  - Complex Staple / Suture removal (26 or more) 0 []  - Hypo / Hyperglycemic Management (close monitor of Blood Glucose) 0 []  - Ankle / Brachial Index (ABI) - do not check if billed separately 0 X - Vital Signs 1 5 Has the patient been seen at the hospital within the last three years: Yes Total Score: 100 Level Of Care: New/Established - Level 3 Electronic Signature(s) Signed: 03/10/2017 5:15:33 PM By: Curtis Sitesorthy, Joanna Entered By: Curtis Sitesorthy, Joanna on 03/10/2017 12:06:25 [name], [name] ([id number]) -------------------------------------------------------------------------------- Encounter Discharge Information Details Patient Name: [name], [name] Date of Service: 03/10/2017 11:00 AM Medical Record Patient Account Number: [id number] Number: Treating RN: Curtis Sitesorthy, Joanna 06-01-1923 (83 y.o. Other Clinician: Date of Birth/Sex: Female) Treating ROBSON, MICHAEL Primary Care Sonyia Muro: Vonita MossRISSMAN, MARK Khaleb Broz/Extender: G Referring Ani Deoliveira:  Vonita MossRISSMAN, MARK Weeks in Treatment: 8 Encounter Discharge Information Items Discharge Pain Level: 0 Discharge Condition: Stable Ambulatory Status: Wheelchair Discharge Destination: Home Transportation: Private Auto Accompanied By: dtr and cg Schedule Follow-up Appointment: Yes Medication Reconciliation completed and provided to Patient/Care No Briya Lookabaugh: Provided on Clinical Summary of Care: 03/10/2017 Form Type Recipient Paper Patient ES Electronic Signature(s) Signed: 03/10/2017 12:00:34 PM By: Gwenlyn PerkingMoore, Shelia Previous Signature: 03/10/2017 11:42:43 AM Version By: Curtis Sitesorthy, Joanna Entered By: Gwenlyn PerkingMoore, Shelia on 03/10/2017 12:00:34 [name], [name] ([id number]) -------------------------------------------------------------------------------- Multi Wound Chart Details Patient Name: [name], [name] Date of Service: 03/10/2017 11:00 AM Medical Record Patient Account Number: [id number] Number: Treating RN: Curtis Sitesorthy, Joanna 06-01-1923 (83 y.o. Other Clinician: Date of Birth/Sex: Female) Treating ROBSON, MICHAEL Primary Care Makyia Erxleben: Vonita MossRISSMAN, MARK Andreya Lacks/Extender: G Referring Hagan Maltz: Vonita MossRISSMAN, MARK Weeks in Treatment: 8 Vital Signs Height(in): Pulse(bpm): 46 Weight(lbs): Blood Pressure 118/44 (mmHg): Body Mass Index(BMI): Temperature(F): 98.2 Respiratory Rate 16 (breaths/min): Photos: [N/A:N/A] Wound Location: Coccyx - Midline N/A N/A Wounding Event: Pressure Injury N/A N/A Primary Etiology: Pressure Ulcer N/A N/A Comorbid History: Anemia, Hypertension, N/A N/A Dementia Date Acquired: 12/01/2016 N/A N/A Weeks of Treatment: 8 N/A N/A Wound Status: Open N/A N/A Measurements L x W x D 1.7x1.8x0.3 N/A N/A (cm) Area (cm) : 2.403 N/A N/A Volume (cm) : 0.721 N/A N/A % Reduction in Area: 42.20% N/A N/A % Reduction in Volume: 56.60% N/A N/A Classification: Category/Stage III N/A N/A Exudate Amount: Medium N/A N/A Exudate Type: Serous N/A N/A Exudate Color:  amber N/A N/A Wound Margin: Flat and Intact N/A N/A Granulation Amount: Large (67-100%) N/A N/A Granulation Quality: Red N/A N/A [name], [name] ([id number]) Necrotic Amount: Small (1-33%) N/A N/A Exposed Structures: Fat Layer (Subcutaneous N/A N/A Tissue) Exposed: Yes Fascia: No Tendon: No Muscle: No Joint: No Bone: No Epithelialization: None N/A N/A Periwound Skin Texture: Induration:  Yes N/A N/A Excoriation: No Callus: No Crepitus: No Rash: No Scarring: No Periwound Skin Maceration: No N/A N/A Moisture: Dry/Scaly: No Periwound Skin Color: Erythema: Yes N/A N/A Atrophie Blanche: No Cyanosis: No Ecchymosis: No Hemosiderin Staining: No Mottled: No Pallor: No Rubor: No Erythema Location: Circumferential N/A N/A Temperature: No Abnormality N/A N/A Tenderness on Yes N/A N/A Palpation: Wound Preparation: Ulcer Cleansing: N/A N/A Rinsed/Irrigated with Saline Topical Anesthetic Applied: Other: lidocaine 4% Treatment Notes Wound #1 (Midline Coccyx) 1. Cleansed with: Clean wound with Normal Saline 2. Anesthetic Topical Lidocaine 4% cream to wound bed prior to debridement 4. Dressing Applied: Prisma Ag 5. Secondary Dressing Applied Bordered Foam Dressing Dry Gauze IYANAH, DEMONT ([id number]) Electronic Signature(s) Signed: 03/10/2017 6:01:27 PM By: Baltazar Najjar MD Previous Signature: 03/10/2017 11:42:19 AM Version By: Curtis Sites Entered By: Baltazar Najjar on 03/10/2017 15:35:04 [name] ([id number]) -------------------------------------------------------------------------------- Multi-Disciplinary Care Plan Details Patient Name: [name] Date of Service: 03/10/2017 11:00 AM Medical Record Patient Account Number: [id number] Number: Treating RN: Curtis Sites 1923-08-30 (83 y.o. Other Clinician: Date of Birth/Sex: Female) Treating ROBSON, MICHAEL Primary Care Traniece Boffa: Vonita Moss Terrion Poblano/Extender: G Referring  Yissel Habermehl: Vonita Moss Weeks in Treatment: 8 Active Inactive ` Abuse / Safety / Falls / Self Care Management Nursing Diagnoses: Impaired physical mobility Potential for falls Goals: Patient will remain injury free Date Initiated: 01/07/2017 Target Resolution Date: 04/03/2017 Goal Status: Active Interventions: Assess fall risk on admission and as needed Notes: ` Nutrition Nursing Diagnoses: Potential for alteratiion in Nutrition/Potential for imbalanced nutrition Goals: Patient/caregiver agrees to and verbalizes understanding of need to use nutritional supplements and/or vitamins as prescribed Date Initiated: 01/07/2017 Target Resolution Date: 04/03/2017 Goal Status: Active Interventions: Assess patient nutrition upon admission and as needed per policy Notes: ` Orientation to the Wound Care Program CHALSEA, DARKO ([id number]) Nursing Diagnoses: Knowledge deficit related to the wound healing center program Goals: Patient/caregiver will verbalize understanding of the Wound Healing Center Program Date Initiated: 01/07/2017 Target Resolution Date: 04/03/2017 Goal Status: Active Interventions: Provide education on orientation to the wound center Notes: ` Pressure Nursing Diagnoses: Knowledge deficit related to causes and risk factors for pressure ulcer development Goals: Patient will remain free from development of additional pressure ulcers Date Initiated: 01/07/2017 Target Resolution Date: 04/03/2017 Goal Status: Active Interventions: Assess potential for pressure ulcer upon admission and as needed Notes: ` Wound/Skin Impairment Nursing Diagnoses: Impaired tissue integrity Goals: Patient/caregiver will verbalize understanding of skin care regimen Date Initiated: 01/07/2017 Target Resolution Date: 04/03/2017 Goal Status: Active Ulcer/skin breakdown will have a volume reduction of 30% by week 4 Date Initiated: 01/07/2017 Target Resolution Date: 04/03/2017 Goal Status:  Active Ulcer/skin breakdown will have a volume reduction of 50% by week 8 Date Initiated: 01/07/2017 Target Resolution Date: 04/03/2017 Goal Status: Active Ulcer/skin breakdown will have a volume reduction of 80% by week 12 PATRECIA, VEIGA ([id number]) Date Initiated: 01/07/2017 Target Resolution Date: 04/03/2017 Goal Status: Active Ulcer/skin breakdown will heal within 14 weeks Date Initiated: 01/07/2017 Target Resolution Date: 04/03/2017 Goal Status: Active Interventions: Assess patient/caregiver ability to obtain necessary supplies Assess patient/caregiver ability to perform ulcer/skin care regimen upon admission and as needed Assess ulceration(s) every visit Notes: Electronic Signature(s) Signed: 03/10/2017 11:42:11 AM By: Curtis Sites Entered By: Curtis Sites on 03/10/2017 11:42:10 [name] ([id number]) -------------------------------------------------------------------------------- Pain Assessment Details Patient Name: [name] Date of Service: 03/10/2017 11:00 AM Medical Record Patient Account Number: [id number] Number: Treating RN: Curtis Sites 27-Jul-1923 (83 y.o. Other Clinician: Date of  Birth/Sex: Female) Treating ROBSON, MICHAEL Primary Care Pammie Chirino: Vonita Moss Brinton Brandel/Extender: G Referring Gearldine Looney: Vonita Moss Weeks in Treatment: 8 Active Problems Location of Pain Severity and Description of Pain Patient Has Paino No Site Locations Pain Management and Medication Current Pain Management: Electronic Signature(s) Signed: 03/10/2017 5:15:33 PM By: Curtis Sites Entered By: Curtis Sites on 03/10/2017 11:02:37 [name] ([id number]) -------------------------------------------------------------------------------- Patient/Caregiver Education Details Patient Name: [name] Date of Service: 03/10/2017 11:00 AM Medical Record Patient Account Number: [id number] Number: Treating RN: Curtis Sites 06/15/1923 (83 y.o. Other Clinician: Date of Birth/Gender: Female) Treating ROBSON, MICHAEL Primary Care Physician: Vonita Moss Physician/Extender: G Referring Physician: Veverly Fells in Treatment: 8 Education Assessment Education Provided To: Caregiver Education Topics Provided Wound/Skin Impairment: Handouts: Other: wound care as ordered Methods: Demonstration, Explain/Verbal Responses: State content correctly Electronic Signature(s) Signed: 03/10/2017 5:15:33 PM By: Curtis Sites Entered By: Curtis Sites on 03/10/2017 11:43:03 [name] ([id number]) -------------------------------------------------------------------------------- Wound Assessment Details Patient Name: [name] Date of Service: 03/10/2017 11:00 AM Medical Record Patient Account Number: [id number] Number: Treating RN: Curtis Sites 26-Mar-1923 (83 y.o. Other Clinician: Date of Birth/Sex: Female) Treating ROBSON, MICHAEL Primary Care Amarachukwu Lakatos: Vonita Moss Etha Stambaugh/Extender: G Referring Quantay Zaremba: Vonita Moss Weeks in Treatment: 8 Wound Status Wound Number: 1 Primary Etiology: Pressure Ulcer Wound Location: Coccyx - Midline Wound Status: Open Wounding Event: Pressure Injury Comorbid History: Anemia, Hypertension, Dementia Date Acquired: 12/01/2016 Weeks Of Treatment: 8 Clustered Wound: No Photos Wound Measurements Length: (cm) 1.7 % Reduction in Area: Width: (cm) 1.8 % Reduction in Volum Depth: (cm) 0.3 Epithelialization: Area: (cm) 2.403 Tunneling: Volume: (cm) 0.721 Undermining: 42.2% e: 56.6% None No No Wound Description Classification: Category/Stage III Foul Odor After Clea Wound Margin: Flat and Intact Slough/Fibrino Exudate Amount: Medium Exudate Type: Serous Exudate Color: amber nsing: No Yes Wound Bed Granulation Amount: Large (67-100%) Exposed Structure Granulation Quality: Red Fascia Exposed: No Necrotic Amount: Small  (1-33%) Fat Layer (Subcutaneous Tissue) Exposed: Yes CLAIRISSA, VALVANO ([id number]) Necrotic Quality: Adherent Slough Tendon Exposed: No Muscle Exposed: No Joint Exposed: No Bone Exposed: No Periwound Skin Texture Texture Color No Abnormalities Noted: No No Abnormalities Noted: No Callus: No Atrophie Blanche: No Crepitus: No Cyanosis: No Excoriation: No Ecchymosis: No Induration: Yes Erythema: Yes Rash: No Erythema Location: Circumferential Scarring: No Hemosiderin Staining: No Mottled: No Moisture Pallor: No No Abnormalities Noted: No Rubor: No Dry / Scaly: No Maceration: No Temperature / Pain Temperature: No Abnormality Tenderness on Palpation: Yes Wound Preparation Ulcer Cleansing: Rinsed/Irrigated with Saline Topical Anesthetic Applied: Other: lidocaine 4%, Treatment Notes Wound #1 (Midline Coccyx) 1. Cleansed with: Clean wound with Normal Saline 2. Anesthetic Topical Lidocaine 4% cream to wound bed prior to debridement 4. Dressing Applied: Prisma Ag 5. Secondary Dressing Applied Bordered Foam Dressing Dry Gauze Electronic Signature(s) Signed: 03/10/2017 11:41:48 AM By: Curtis Sites Entered By: Curtis Sites on 03/10/2017 11:41:48 [name] ([id number]) -------------------------------------------------------------------------------- Vitals Details Patient Name: [name] Date of Service: 03/10/2017 11:00 AM Medical Record Patient Account Number: [id number] Number: Treating RN: Curtis Sites May 20, 1923 (83 y.o. Other Clinician: Date of Birth/Sex: Female) Treating ROBSON, MICHAEL Primary Care Madine Sarr: Vonita Moss Ramon Brant/Extender: G Referring Tip Atienza: Vonita Moss Weeks in Treatment: 8 Vital Signs Time Taken: 11:02 Temperature (F): 98.2 Pulse (bpm): 46 Respiratory Rate (breaths/min): 16 Blood Pressure (mmHg): 118/44 Reference Range: 80 - 120 mg / dl Electronic Signature(s) Signed: 03/10/2017 5:15:33 PM By:  Curtis Sites Entered By: Curtis Sites on 03/10/2017 11:03:00

## 2017-03-12 ENCOUNTER — Encounter: Payer: Medicare Other | Admitting: Surgery

## 2017-03-12 DIAGNOSIS — L89150 Pressure ulcer of sacral region, unstageable: Secondary | ICD-10-CM | POA: Diagnosis not present

## 2017-03-14 NOTE — Progress Notes (Signed)
[name], Haizel W. ([id number]) Visit Report for 03/12/2017 Arrival Information Details Patient Name: [name], [name] Date of Service: 03/12/2017 2:30 PM Medical Record Number: [id number] Patient Account Number: [id number] Date of Birth/Sex: [birth year] (83 y.o. Female) Treating RN: Ashok CordiaPinkerton, Debi Primary Care Leandria Thier: Vonita MossRISSMAN, MARK Other Clinician: Referring Iliyah [name]: Vonita MossRISSMAN, MARK Treating Kynlee Koenigsberg/Extender: Rudene ReBritto, Errol Weeks in Treatment: 9 Visit Information History Since Last Visit All ordered tests and consults were No Patient Arrived: Wheel Chair completed: Arrival Time: 14:40 Added or deleted any medications: No Accompanied By: daughter, Any new allergies or adverse No caregiver reactions: Transfer Assistance: Other Had a fall or experienced change in No Patient Identification Verified: Yes activities of daily living that may Secondary Verification Process Yes affect Completed: risk of falls: Patient Requires Transmission- No Signs or symptoms of abuse/neglect No Based Precautions: since last visito Patient Has Alerts: No Hospitalized since last visit: No Has Dressing in Place as Yes Prescribed: Pain Present Now: Unable to Respond Electronic Signature(s) Signed: 03/12/2017 5:25:16 PM By: Alejandro MullingPinkerton, Debra Entered By: Alejandro MullingPinkerton, Debra on 03/12/2017 14:41:02 [name], Trinna W. ([id number]) -------------------------------------------------------------------------------- Clinic Level of Care Assessment Details Patient Name: [name], [name] Date of Service: 03/12/2017 2:30 PM Medical Record Number: [id number] Patient Account Number: [id number] Date of Birth/Sex: [birth year] (83 y.o. Female) Treating RN: Ashok CordiaPinkerton, Debi Primary Care Rjay Revolorio: Vonita MossRISSMAN, MARK Other Clinician: Referring Josecarlos Harriott: Vonita MossRISSMAN, MARK Treating Tiziana Cislo/Extender: Rudene ReBritto, Errol Weeks in Treatment: 9 Clinic Level of Care Assessment Items TOOL 4 Quantity Score X - Use when only an EandM is  performed on FOLLOW-UP visit 1 0 ASSESSMENTS - Nursing Assessment / Reassessment X - Reassessment of Co-morbidities (includes updates in patient status) 1 10 X - Reassessment of Adherence to Treatment Plan 1 5 ASSESSMENTS - Wound and Skin Assessment / Reassessment X - Simple Wound Assessment / Reassessment - one wound 1 5 []  - Complex Wound Assessment / Reassessment - multiple wounds 0 []  - Dermatologic / Skin Assessment (not related to wound area) 0 ASSESSMENTS - Focused Assessment []  - Circumferential Edema Measurements - multi extremities 0 []  - Nutritional Assessment / Counseling / Intervention 0 []  - Lower Extremity Assessment (monofilament, tuning fork, pulses) 0 []  - Peripheral Arterial Disease Assessment (using hand held doppler) 0 ASSESSMENTS - Ostomy and/or Continence Assessment and Care []  - Incontinence Assessment and Management 0 []  - Ostomy Care Assessment and Management (repouching, etc.) 0 PROCESS - Coordination of Care []  - Simple Patient / Family Education for ongoing care 0 X - Complex (extensive) Patient / Family Education for ongoing care 1 20 X - Staff obtains ChiropractorConsents, Records, Test Results / Process Orders 1 10 []  - Staff telephones HHA, Nursing Homes / Clarify orders / etc 0 []  - Routine Transfer to another Facility (non-emergent condition) 0 [name], [name] ([id number]) []  - Routine Hospital Admission (non-emergent condition) 0 []  - New Admissions / Manufacturing engineernsurance Authorizations / Ordering NPWT, Apligraf, etc. 0 []  - Emergency Hospital Admission (emergent condition) 0 X - Simple Discharge Coordination 1 10 []  - Complex (extensive) Discharge Coordination 0 PROCESS - Special Needs []  - Pediatric / Minor Patient Management 0 []  - Isolation Patient Management 0 []  - Hearing / Language / Visual special needs 0 []  - Assessment of Community assistance (transportation, D/C planning, etc.) 0 []  - Additional assistance / Altered mentation 0 []  - Support Surface(s)  Assessment (bed, cushion, seat, etc.) 0 INTERVENTIONS - Wound Cleansing / Measurement X - Simple Wound Cleansing - one wound 1 5 []  - Complex Wound Cleansing - multiple wounds 0  X - Wound Imaging (photographs - any number of wounds) 1 5 []  - Wound Tracing (instead of photographs) 0 X - Simple Wound Measurement - one wound 1 5 []  - Complex Wound Measurement - multiple wounds 0 INTERVENTIONS - Wound Dressings X - Small Wound Dressing one or multiple wounds 1 10 []  - Medium Wound Dressing one or multiple wounds 0 []  - Large Wound Dressing one or multiple wounds 0 X - Application of Medications - topical 1 5 []  - Application of Medications - injection 0 INTERVENTIONS - Miscellaneous []  - External ear exam 0 [name], HARDT. ([id number]) []  - Specimen Collection (cultures, biopsies, blood, body fluids, etc.) 0 []  - Specimen(s) / Culture(s) sent or taken to Lab for analysis 0 []  - Patient Transfer (multiple staff / Michiel Sites Lift / Similar devices) 0 []  - Simple Staple / Suture removal (25 or less) 0 []  - Complex Staple / Suture removal (26 or more) 0 []  - Hypo / Hyperglycemic Management (close monitor of Blood Glucose) 0 []  - Ankle / Brachial Index (ABI) - do not check if billed separately 0 X - Vital Signs 1 5 Has the patient been seen at the hospital within the last three years: Yes Total Score: 95 Level Of Care: New/Established - Level 3 Electronic Signature(s) Signed: 03/12/2017 5:25:16 PM By: Alejandro Mulling Entered By: Alejandro Mulling on 03/12/2017 16:31:16 [name] ([id number]) -------------------------------------------------------------------------------- Encounter Discharge Information Details Patient Name: [name] Date of Service: 03/12/2017 2:30 PM Medical Record Number: [id number] Patient Account Number: [id number] Date of Birth/Sex: Jul 18, 1923 (83 y.o. Female) Treating RN: Ashok Cordia, Debi Primary Care Ram Haugan: Vonita Moss Other Clinician: Referring  Ilija Maxim: Vonita Moss Treating Rayelle Armor/Extender: Rudene Re in Treatment: 9 Encounter Discharge Information Items Discharge Condition: Stable Ambulatory Status: Wheelchair Discharge Destination: Home Transportation: Private Auto daughter and Accompanied By: caregiver Schedule Follow-up Appointment: Yes Medication Reconciliation completed and provided to Patient/Care No Emmet Messer: Provided on Clinical Summary of Care: 03/12/2017 Form Type Recipient Paper Patient ES Electronic Signature(s) Signed: 03/12/2017 3:14:47 PM By: Gwenlyn Perking Entered By: Gwenlyn Perking on 03/12/2017 15:14:47 [name] ([id number]) -------------------------------------------------------------------------------- Lower Extremity Assessment Details Patient Name: [name] Date of Service: 03/12/2017 2:30 PM Medical Record Number: [id number] Patient Account Number: [id number] Date of Birth/Sex: [birth year] (83 y.o. Female) Treating RN: Ashok Cordia, Debi Primary Care Careena Degraffenreid: Vonita Moss Other Clinician: Referring Kaden Dunkel: Vonita Moss Treating Canden Cieslinski/Extender: Rudene Re in Treatment: 9 Electronic Signature(s) Signed: 03/12/2017 5:25:16 PM By: Alejandro Mulling Entered By: Alejandro Mulling on 03/12/2017 14:51:14 [name] ([id number]) -------------------------------------------------------------------------------- Multi Wound Chart Details Patient Name: [name] Date of Service: 03/12/2017 2:30 PM Medical Record Number: [id number] Patient Account Number: [id number] Date of Birth/Sex: 1922-11-16 (83 y.o. Female) Treating RN: Ashok Cordia, Debi Primary Care Jennings Corado: Vonita Moss Other Clinician: Referring Trini Christiansen: Vonita Moss Treating Shanell Aden/Extender: Rudene Re in Treatment: 9 Vital Signs Height(in): Pulse(bpm): 61 Weight(lbs): Blood Pressure 155/95 (mmHg): Body Mass Index(BMI): Temperature(F): 98.2 Respiratory  Rate 16 (breaths/min): Photos: [1:No Photos] [N/A:N/A] Wound Location: [1:Coccyx - Midline] [N/A:N/A] Wounding Event: [1:Pressure Injury] [N/A:N/A] Primary Etiology: [1:Pressure Ulcer] [N/A:N/A] Comorbid History: [1:Anemia, Hypertension, Dementia] [N/A:N/A] Date Acquired: [1:12/01/2016] [N/A:N/A] Weeks of Treatment: [1:9] [N/A:N/A] Wound Status: [1:Open] [N/A:N/A] Measurements L x W x D 1.7x1.8x0.3 [N/A:N/A] (cm) Area (cm) : [1:2.403] [N/A:N/A] Volume (cm) : [1:0.721] [N/A:N/A] % Reduction in Area: [1:42.20%] [N/A:N/A] % Reduction in Volume: 56.60% [N/A:N/A] Classification: [1:Category/Stage III] [N/A:N/A] Exudate Amount: [1:Medium] [N/A:N/A] Exudate Type: [1:Serous] [N/A:N/A] Exudate Color: [1:amber] [N/A:N/A] Wound Margin: [1:Flat and  Intact] [N/A:N/A] Granulation Amount: [1:Large (67-100%)] [N/A:N/A] Granulation Quality: [1:Red] [N/A:N/A] Necrotic Amount: [1:Small (1-33%)] [N/A:N/A] Exposed Structures: [1:Fat Layer (Subcutaneous Tissue) Exposed: Yes Fascia: No Tendon: No Muscle: No Joint: No Bone: No] [N/A:N/A] Epithelialization: None N/A N/A Periwound Skin Texture: Induration: Yes N/A N/A Excoriation: No Callus: No Crepitus: No Rash: No Scarring: No Periwound Skin Maceration: No N/A N/A Moisture: Dry/Scaly: No Periwound Skin Color: Erythema: Yes N/A N/A Atrophie Blanche: No Cyanosis: No Ecchymosis: No Hemosiderin Staining: No Mottled: No Pallor: No Rubor: No Erythema Location: Circumferential N/A N/A Temperature: No Abnormality N/A N/A Tenderness on Yes N/A N/A Palpation: Wound Preparation: Ulcer Cleansing: N/A N/A Rinsed/Irrigated with Saline Topical Anesthetic Applied: Other: lidocaine 4% Treatment Notes Wound #1 (Midline Coccyx) 1. Cleansed with: Clean wound with Normal Saline 2. Anesthetic Topical Lidocaine 4% cream to wound bed prior to debridement 3. Peri-wound Care: Other peri-wound care (specify in notes) 4. Dressing Applied: Prisma  Ag 5. Secondary Dressing Applied Dry Gauze Electronic Signature(s) Signed: 03/12/2017 4:12:37 PM By: Evlyn Kanner MD, FACS Entered By: Evlyn Kanner on 03/12/2017 16:12:37 [name] ([id number]) -------------------------------------------------------------------------------- Multi-Disciplinary Care Plan Details Patient Name: [name] Date of Service: 03/12/2017 2:30 PM Medical Record Number: [id number] Patient Account Number: [id number] Date of Birth/Sex: 10-01-1922 (83 y.o. Female) Treating RN: Ashok Cordia, Debi Primary Care Karriem Muench: Vonita Moss Other Clinician: Referring Dorothe Elmore: Vonita Moss Treating Kellianne Ek/Extender: Rudene Re in Treatment: 9 Active Inactive ` Abuse / Safety / Falls / Self Care Management Nursing Diagnoses: Impaired physical mobility Potential for falls Goals: Patient will remain injury free Date Initiated: 01/07/2017 Target Resolution Date: 04/03/2017 Goal Status: Active Interventions: Assess fall risk on admission and as needed Notes: ` Nutrition Nursing Diagnoses: Potential for alteratiion in Nutrition/Potential for imbalanced nutrition Goals: Patient/caregiver agrees to and verbalizes understanding of need to use nutritional supplements and/or vitamins as prescribed Date Initiated: 01/07/2017 Target Resolution Date: 04/03/2017 Goal Status: Active Interventions: Assess patient nutrition upon admission and as needed per policy Notes: ` Orientation to the Wound Care Program Nursing Diagnoses: BRILYNN, BIASI ([id number]) Knowledge deficit related to the wound healing center program Goals: Patient/caregiver will verbalize understanding of the Wound Healing Center Program Date Initiated: 01/07/2017 Target Resolution Date: 04/03/2017 Goal Status: Active Interventions: Provide education on orientation to the wound center Notes: ` Pressure Nursing Diagnoses: Knowledge deficit related to causes and risk factors for  pressure ulcer development Goals: Patient will remain free from development of additional pressure ulcers Date Initiated: 01/07/2017 Target Resolution Date: 04/03/2017 Goal Status: Active Interventions: Assess potential for pressure ulcer upon admission and as needed Notes: ` Wound/Skin Impairment Nursing Diagnoses: Impaired tissue integrity Goals: Patient/caregiver will verbalize understanding of skin care regimen Date Initiated: 01/07/2017 Target Resolution Date: 04/03/2017 Goal Status: Active Ulcer/skin breakdown will have a volume reduction of 30% by week 4 Date Initiated: 01/07/2017 Target Resolution Date: 04/03/2017 Goal Status: Active Ulcer/skin breakdown will have a volume reduction of 50% by week 8 Date Initiated: 01/07/2017 Target Resolution Date: 04/03/2017 Goal Status: Active Ulcer/skin breakdown will have a volume reduction of 80% by week 12 Date Initiated: 01/07/2017 Target Resolution Date: 04/03/2017 Goal Status: Active LALITHA, ILYAS ([id number]) Ulcer/skin breakdown will heal within 14 weeks Date Initiated: 01/07/2017 Target Resolution Date: 04/03/2017 Goal Status: Active Interventions: Assess patient/caregiver ability to obtain necessary supplies Assess patient/caregiver ability to perform ulcer/skin care regimen upon admission and as needed Assess ulceration(s) every visit Notes: Electronic Signature(s) Signed: 03/12/2017 5:25:16 PM By: Alejandro Mulling Entered By: Alejandro Mulling on 03/12/2017 14:57:47 Karel, Sussan  Lacretia Nicks ([id number]) -------------------------------------------------------------------------------- Non-Wound Condition Assessment Details Patient Name: SANDRINA, HEATON Date of Service: 03/12/2017 2:30 PM Medical Record Number: [id number] Patient Account Number: [id number] Date of Birth/Sex: 04-Jun-1923 (83 y.o. Female) Treating RN: Ashok Cordia, Debi Primary Care Nicki Gracy: Vonita Moss Other Clinician: Referring Sean Macwilliams: Vonita Moss Treating  Indio Santilli/Extender: Rudene Re in Treatment: 9 Non-Wound Condition: Condition: Rash / Dermatitis Location: Other: Gluteus Side: Left Photos Periwound Skin Texture Texture Color No Abnormalities Noted: No No Abnormalities Noted: No Moisture No Abnormalities Noted: No Electronic Signature(s) Unsigned Entered By: Alejandro Mulling on 03/13/2017 09:51:02 Signature(s): Date(s): [name] ([id number]) -------------------------------------------------------------------------------- Pain Assessment Details Patient Name: JAYLEY, HUSTEAD Date of Service: 03/12/2017 2:30 PM Medical Record Number: [id number] Patient Account Number: [id number] Date of Birth/Sex: 1923-06-28 (83 y.o. Female) Treating RN: Ashok Cordia, Debi Primary Care Stevee Valenta: Vonita Moss Other Clinician: Referring Brina Umeda: Vonita Moss Treating Gael Delude/Extender: Rudene Re in Treatment: 9 Active Problems Location of Pain Severity and Description of Pain Patient Has Paino Patient Unable to Respond Site Locations Pain Management and Medication Current Pain Management: Electronic Signature(s) Signed: 03/12/2017 5:25:16 PM By: Alejandro Mulling Entered By: Alejandro Mulling on 03/12/2017 14:41:09 [name] ([id number]) -------------------------------------------------------------------------------- Patient/Caregiver Education Details Patient Name: [name] Date of Service: 03/12/2017 2:30 PM Medical Record Number: [id number] Patient Account Number: [id number] Date of Birth/Gender: Mar 21, 1923 (83 y.o. Female) Treating RN: Phillis Haggis Primary Care Physician: Vonita Moss Other Clinician: Referring Physician: Vonita Moss Treating Physician/Extender: Rudene Re in Treatment: 9 Education Assessment Education Provided To: Patient Education Topics Provided Wound/Skin Impairment: Handouts: Other: change dressing as ordered Methods: Demonstration,  Explain/Verbal Responses: State content correctly Electronic Signature(s) Signed: 03/12/2017 5:25:16 PM By: Alejandro Mulling Entered By: Alejandro Mulling on 03/12/2017 14:58:59 [name] ([id number]) -------------------------------------------------------------------------------- Wound Assessment Details Patient Name: [name] Date of Service: 03/12/2017 2:30 PM Medical Record Number: [id number] Patient Account Number: [id number] Date of Birth/Sex: 04/14/1923 (83 y.o. Female) Treating RN: Ashok Cordia, Debi Primary Care Drew Herman: Vonita Moss Other Clinician: Referring Nora Rooke: Vonita Moss Treating Allegra Cerniglia/Extender: Rudene Re in Treatment: 9 Wound Status Wound Number: 1 Primary Etiology: Pressure Ulcer Wound Location: Coccyx - Midline Wound Status: Open Wounding Event: Pressure Injury Comorbid History: Anemia, Hypertension, Dementia Date Acquired: 12/01/2016 Weeks Of Treatment: 9 Clustered Wound: No Photos Photo Uploaded By: Curtis Sites on 03/12/2017 16:36:55 Wound Measurements Length: (cm) 1.7 Width: (cm) 1.8 Depth: (cm) 0.3 Area: (cm) 2.403 Volume: (cm) 0.721 % Reduction in Area: 42.2% % Reduction in Volume: 56.6% Epithelialization: None Tunneling: No Undermining: No Wound Description Classification: Category/Stage III Wound Margin: Flat and Intact Exudate Amount: Medium Exudate Type: Serous Exudate Color: amber Foul Odor After Cleansing: No Slough/Fibrino Yes Wound Bed Granulation Amount: Large (67-100%) Exposed Structure Granulation Quality: Red Fascia Exposed: No Necrotic Amount: Small (1-33%) Fat Layer (Subcutaneous Tissue) Exposed: Yes Necrotic Quality: Adherent Slough Tendon Exposed: No ABRIA, VANNOSTRAND. ([id number]) Muscle Exposed: No Joint Exposed: No Bone Exposed: No Periwound Skin Texture Texture Color No Abnormalities Noted: No No Abnormalities Noted: No Callus: No Atrophie Blanche: No Crepitus:  No Cyanosis: No Excoriation: No Ecchymosis: No Induration: Yes Erythema: Yes Rash: No Erythema Location: Circumferential Scarring: No Hemosiderin Staining: No Mottled: No Moisture Pallor: No No Abnormalities Noted: No Rubor: No Dry / Scaly: No Maceration: No Temperature / Pain Temperature: No Abnormality Tenderness on Palpation: Yes Wound Preparation Ulcer Cleansing: Rinsed/Irrigated with Saline Topical Anesthetic Applied: Other: lidocaine 4%, Treatment Notes Wound #1 (Midline Coccyx) 1. Cleansed with: Clean wound with Normal Saline 2. Anesthetic Topical Lidocaine  4% cream to wound bed prior to debridement 3. Peri-wound Care: Other peri-wound care (specify in notes) 4. Dressing Applied: Prisma Ag 5. Secondary Dressing Applied Dry Gauze Electronic Signature(s) Signed: 03/12/2017 5:25:16 PM By: Alejandro Mulling Entered By: Alejandro Mulling on 03/12/2017 14:49:49 [name] ([id number]) -------------------------------------------------------------------------------- Vitals Details Patient Name: [name] Date of Service: 03/12/2017 2:30 PM Medical Record Number: [id number] Patient Account Number: [id number] Date of Birth/Sex: [birth year] (83 y.o. Female) Treating RN: Ashok Cordia, Debi Primary Care Susanne Baumgarner: Vonita Moss Other Clinician: Referring Parlee Amescua: Vonita Moss Treating Ellon Marasco/Extender: Rudene Re in Treatment: 9 Vital Signs Time Taken: 14:41 Temperature (F): 98.2 Pulse (bpm): 61 Respiratory Rate (breaths/min): 16 Blood Pressure (mmHg): 155/95 Reference Range: 80 - 120 mg / dl Electronic Signature(s) Signed: 03/12/2017 5:25:16 PM By: Alejandro Mulling Entered By: Alejandro Mulling on 03/12/2017 14:45:18

## 2017-03-14 NOTE — Progress Notes (Addendum)
SHEREESE, [name] ([id number]) Visit Report for 03/12/2017 Chief Complaint Document Details Patient Name: [name], [name] Date of Service: 03/12/2017 2:30 PM Medical Record Number: [id number] Patient Account Number: [id number] Date of Birth/Sex: October 23, 1922 (83 y.o. Female) Treating RN: Ashok Cordia, Debi Primary Care Provider: Vonita Moss Other Clinician: Referring Provider: Vonita Moss Treating Provider/Extender: Rudene Re in Treatment: 9 Information Obtained from: Patient Chief Complaint 01/07/17; patient is here for review of a sacral pressure ulcer Electronic Signature(s) Signed: 03/12/2017 4:12:43 PM By: Evlyn Kanner MD, FACS Entered By: Evlyn Kanner on 03/12/2017 16:12:43 [name] ([id number]) -------------------------------------------------------------------------------- HPI Details Patient Name: [name] Date of Service: 03/12/2017 2:30 PM Medical Record Number: [id number] Patient Account Number: [id number] Date of Birth/Sex: 06/19/1923 (83 y.o. Female) Treating RN: Ashok Cordia, Debi Primary Care Provider: Vonita Moss Other Clinician: Referring Provider: Vonita Moss Treating Provider/Extender: Rudene Re in Treatment: 9 History of Present Illness HPI Description: 01/07/17 this is a 83 year old woman admitted to the clinic today for review of a pressure ulcer on her lower sacrum. She is referred from her primary physician's office after being seen on 3/22 with a 3 cm pressure area. Her daughter and caretaker accompanied her today state that the area first became obvious about a month ago and his since deteriorated. They have recently got [name] a home health involved and have been using [name] to the wound. They have ordered a pressure relief surface for her mattress. They are turning her religiously. They state that she eats well and they've been forcing fluids on her. She is on a multivitamin. Looking through [facility] point last  albumin I see was 4.4 on 10/28. The patient has advanced parkinsonism which looks superficially like advanced Parkinson's disease although her daughter tells me she did not ever respond to Sinemet therefore this may have another pathology with signs of parkinsonism. However I think this is largely a mute point currently. She also has dementia and is nonambulatory. Since this started they have been keeping her in bed and turning her religiously every 2 hours. She lives at home in [place] with her husband with 24/7 care giving 01/13/17 santyl change qd. Still will require further debridement. continue santyl. 01/20/17; patient's wound actually looks some better less adherent necrotic surface. There is actually visible granulation. We're using [name] 01/27/17; better-looking surface but still a lot of necrotic tissue on the base of this wound. The periwound erythema is better than last week we are still using [name]. Her daughter tells [place] that she is still having trouble with the pressure-relief mattress through medical modalities 02/03/18; I had the patient scheduled for a two week followup however her daughter brought her in early concerned for discoloration on 2 areas of the wound circumference. We have bee using santyl 02/10/17;Better looking surface to the wound. Rim appears better suggesting better offloading. Using santyl 02/24/17; change to Silver Collegen last time. Wound appears better. 03/10/17; still using silver collagen religious offloading. Intake is satisfactory per her daughter. Dimension slightly better 03/12/2017 -- Dr. Jannetta Quint patient who had been seen 2 days ago and was doing fairly well. The patient is brought in by her daughter who noticed a new wound just above the previous wound on her sacral area and going on more to the left lateral side. She was very concerned and we asked her to get in for an opinion Electronic Signature(s) Signed: 03/12/2017 4:13:38 PM By: Evlyn Kanner MD,  FACS Entered By: Evlyn Kanner on 03/12/2017 16:13:38 [name] ([id number]) --------------------------------------------------------------------------------  Physical Exam Details Patient Name: Tina MainlandSHAW, [name] Date of Service: 03/12/2017 2:30 PM Medical Record Number: [id number] Patient Account Number: [id number] Date of Birth/Sex: 12/08/1922 (83 y.o. Female) Treating RN: Ashok CordiaPinkerton, Debi Primary Care Provider: Vonita MossRISSMAN, MARK Other Clinician: Referring Provider: Vonita MossRISSMAN, MARK Treating Provider/Extender: Rudene ReBritto, Nahiem Dredge Weeks in Treatment: 9 Constitutional . Pulse regular. Respirations normal and unlabored. Afebrile. . Eyes Nonicteric. Reactive to light. Ears, Nose, Mouth, and Throat Lips, teeth, and gums WNL.Marland Kitchen. Moist mucosa without lesions. Neck supple and nontender. No palpable supraclavicular or cervical adenopathy. Normal sized without goiter. Respiratory WNL. No retractions.. Cardiovascular Pedal Pulses WNL. No clubbing, cyanosis or edema. Chest Breasts symmetical and no nipple discharge.. Breast tissue WNL, no masses, lumps, or tenderness.. Lymphatic No adneopathy. No adenopathy. No adenopathy. Musculoskeletal Adexa without tenderness or enlargement.. Digits and nails w/o clubbing, cyanosis, infection, petechiae, ischemia, or inflammatory conditions.. Integumentary (Hair, Skin) No suspicious lesions. No crepitus or fluctuance. No peri-wound warmth or erythema. No masses.Marland Kitchen. Psychiatric Judgement and insight Intact.. No evidence of depression, anxiety, or agitation.. Notes the original wound is looking fairly good and there is a minimal area of darkness around the right lateral edge. The base of the wound is clean. The area of concern is just above the wound and starts just to the right of the midline and goes on for several centimeters to the left of the midline and it is a linear streak approximately 3 cm wide. There is thickening of the skin and redness in a very  demarcated area which looks like contact dermatitis due to some unknown cause. There is no open wound and there is some minimal lymphedema in this area. Electronic Signature(s) Signed: 03/12/2017 4:15:06 PM By: Evlyn KannerBritto, Briauna Gilmartin MD, FACS Entered By: Evlyn KannerBritto, Larisha Vencill on 03/12/2017 16:15:06 Tina MainlandSHAW, Skylie W. ([id number]) Tina MainlandSHAW, Tyniah W. ([id number]) -------------------------------------------------------------------------------- Physician Orders Details Patient Name: Tina MainlandSHAW, Machele W. Date of Service: 03/12/2017 2:30 PM Medical Record Number: [id number] Patient Account Number: [id number] Date of Birth/Sex: 12/08/1922 (83 y.o. Female) Treating RN: Ashok CordiaPinkerton, Debi Primary Care Provider: Vonita MossRISSMAN, MARK Other Clinician: Referring Provider: Vonita MossRISSMAN, MARK Treating Provider/Extender: Rudene ReBritto, Jarrett Chicoine Weeks in Treatment: 9 Verbal / Phone Orders: Yes Clinician: Ashok CordiaPinkerton, Debi Read Back and Verified: Yes Diagnosis Coding Wound Cleansing Wound #1 Midline Coccyx o Clean wound with Normal Saline. o May Shower, gently pat wound dry prior to applying new dressing. Anesthetic Wound #1 Midline Coccyx o Topical Lidocaine 4% cream applied to wound bed prior to debridement - in clinic Skin Barriers/Peri-Wound Care Wound #1 Midline Coccyx o Skin Prep o Triamcinolone Acetonide Ointment - on new reddened area on the left gluteus Primary Wound Dressing Wound #1 Midline Coccyx o Prisma Ag Secondary Dressing Wound #1 Midline Coccyx o Dry Gauze o Boardered Foam Dressing - silicon bordered dressing due to skin breakdown Dressing Change Frequency Wound #1 Midline Coccyx o Change dressing every day. Follow-up Appointments Wound #1 Midline Coccyx o Return Appointment in 2 weeks. Off-Loading Wound #1 Midline Coccyx o Roho cushion for wheelchair - HHRN please order roho cushion or gel cushion for patients wheelchair - whichever she qualifies for Tina MainlandSHAW, Arica W. ([id number]) o Turn and  reposition every 2 hours Additional Orders / Instructions Wound #1 Midline Coccyx o Increase protein intake. - please add protein supplements to patients diet o Other: - please add vitamin A, vitamin C and zinc supplements to patients diet Medications-please add to medication list. Wound #1 Midline Coccyx o Other: - Cortisone Cream at home on new reddened area on the left gluteus Electronic Signature(s) Signed:  03/12/2017 4:29:00 PM By: Evlyn Kanner MD, FACS Signed: 03/12/2017 5:25:16 PM By: Alejandro Mulling Entered By: Alejandro Mulling on 03/12/2017 15:03:09 [name] ([id number]) -------------------------------------------------------------------------------- Problem List Details Patient Name: [name] Date of Service: 03/12/2017 2:30 PM Medical Record Number: [id number] Patient Account Number: [id number] Date of Birth/Sex: 01/04/1923 (83 y.o. Female) Treating RN: Ashok Cordia, Debi Primary Care Provider: Vonita Moss Other Clinician: Referring Provider: Vonita Moss Treating Provider/Extender: Rudene Re in Treatment: 9 Active Problems ICD-10 Encounter Code Description Active Date Diagnosis L89.150 Pressure ulcer of sacral region, unstageable 01/07/2017 Yes G20 Parkinson's disease 01/07/2017 Yes Inactive Problems Resolved Problems Electronic Signature(s) Signed: 03/12/2017 4:12:32 PM By: Evlyn Kanner MD, FACS Entered By: Evlyn Kanner on 03/12/2017 16:12:32 [name] ([id number]) -------------------------------------------------------------------------------- Progress Note Details Patient Name: [name] Date of Service: 03/12/2017 2:30 PM Medical Record Number: [id number] Patient Account Number: [id number] Date of Birth/Sex: [birth year] (83 y.o. Female) Treating RN: Ashok Cordia, Debi Primary Care Provider: Vonita Moss Other Clinician: Referring Provider: Vonita Moss Treating Provider/Extender: Rudene Re in  Treatment: 9 Subjective Chief Complaint Information obtained from Patient 01/07/17; patient is here for review of a sacral pressure ulcer History of Present Illness (HPI) 01/07/17 this is a 83 year old woman admitted to the clinic today for review of a pressure ulcer on her lower sacrum. She is referred from her primary physician's office after being seen on 3/22 with a 3 cm pressure area. Her daughter and caretaker accompanied her today state that the area first became obvious about a month ago and his since deteriorated. They have recently got [name] a home health involved and have been using [name] to the wound. They have ordered a pressure relief surface for her mattress. They are turning her religiously. They state that she eats well and they've been forcing fluids on her. She is on a multivitamin. Looking through [facility] point last albumin I see was 4.4 on 10/28. The patient has advanced parkinsonism which looks superficially like advanced Parkinson's disease although her daughter tells me she did not ever respond to Sinemet therefore this may have another pathology with signs of parkinsonism. However I think this is largely a mute point currently. She also has dementia and is nonambulatory. Since this started they have been keeping her in bed and turning her religiously every 2 hours. She lives at home in [place] with her husband with 24/7 care giving 01/13/17 santyl change qd. Still will require further debridement. continue santyl. 01/20/17; patient's wound actually looks some better less adherent necrotic surface. There is actually visible granulation. We're using [name] 01/27/17; better-looking surface but still a lot of necrotic tissue on the base of this wound. The periwound erythema is better than last week we are still using [name]. Her daughter tells [place] that she is still having trouble with the pressure-relief mattress through medical modalities 02/03/18; I had the patient  scheduled for a two week followup however her daughter brought her in early concerned for discoloration on 2 areas of the wound circumference. We have bee using santyl 02/10/17;Better looking surface to the wound. Rim appears better suggesting better offloading. Using santyl 02/24/17; change to Silver Collegen last time. Wound appears better. 03/10/17; still using silver collagen religious offloading. Intake is satisfactory per her daughter. Dimension slightly better 03/12/2017 -- Dr. Jannetta Quint patient who had been seen 2 days ago and was doing fairly well. The patient is brought in by her daughter who noticed a new wound just above the previous wound on her sacral area  and going on more to the left lateral side. She was very concerned and we asked her to get in for an opinion DELANE, STALLING. ([id number]) Objective Constitutional Pulse regular. Respirations normal and unlabored. Afebrile. Vitals Time Taken: 2:41 PM, Temperature: 98.2 F, Pulse: 61 bpm, Respiratory Rate: 16 breaths/min, Blood Pressure: 155/95 mmHg. Eyes Nonicteric. Reactive to light. Ears, Nose, Mouth, and Throat Lips, teeth, and gums WNL.Marland Kitchen Moist mucosa without lesions. Neck supple and nontender. No palpable supraclavicular or cervical adenopathy. Normal sized without goiter. Respiratory WNL. No retractions.. Cardiovascular Pedal Pulses WNL. No clubbing, cyanosis or edema. Chest Breasts symmetical and no nipple discharge.. Breast tissue WNL, no masses, lumps, or tenderness.. Lymphatic No adneopathy. No adenopathy. No adenopathy. Musculoskeletal Adexa without tenderness or enlargement.. Digits and nails w/o clubbing, cyanosis, infection, petechiae, ischemia, or inflammatory conditions.Marland Kitchen Psychiatric Judgement and insight Intact.. No evidence of depression, anxiety, or agitation.. General Notes: the original wound is looking fairly good and there is a minimal area of darkness around the right lateral edge. The base of  the wound is clean. The area of concern is just above the wound and starts just to the right of the midline and goes on for several centimeters to the left of the midline and it is a linear streak approximately 3 cm wide. There is thickening of the skin and redness in a very demarcated area which looks like contact dermatitis due to some unknown cause. There is no open wound and there is some minimal lymphedema in this area. Integumentary (Hair, Skin) No suspicious lesions. No crepitus or fluctuance. No peri-wound warmth or erythema. No masses.Clelia Croft, Myles Gip ([id number]) Wound #1 status is Open. Original cause of wound was Pressure Injury. The wound is located on the Midline Coccyx. The wound measures 1.7cm length x 1.8cm width x 0.3cm depth; 2.403cm^2 area and 0.721cm^3 volume. There is Fat Layer (Subcutaneous Tissue) Exposed exposed. There is no tunneling or undermining noted. There is a medium amount of serous drainage noted. The wound margin is flat and intact. There is large (67-100%) red granulation within the wound bed. There is a small (1-33%) amount of necrotic tissue within the wound bed including Adherent Slough. The periwound skin appearance exhibited: Induration, Erythema. The periwound skin appearance did not exhibit: Callus, Crepitus, Excoriation, Rash, Scarring, Dry/Scaly, Maceration, Atrophie Blanche, Cyanosis, Ecchymosis, Hemosiderin Staining, Mottled, Pallor, Rubor. The surrounding wound skin color is noted with erythema which is circumferential. Periwound temperature was noted as No Abnormality. The periwound has tenderness on palpation. Other Condition(s) Patient presents with Rash / Dermatitis located on the Left Gluteus. Assessment Active Problems ICD-10 L89.150 - Pressure ulcer of sacral region, unstageable G20 - Parkinson's disease After review of this uncommon condition, which is more like a contact dermatitis and there is a linear welt across the superior part  of the wound more on the left side and going just to the right of the midline. since there is no open wound in this new area I have asked for some triamcinolone cream to be applied, and use an appropriate dressing which was Prisma AG in the ulcerated sacral wound. The daughter was at the bedside we'll use 1% hydrocortisone ointment at home and if this increases or goes to other sites she will contact her PCP. If all is well, they will keep their next appointment with Dr. Leanord Hawking Plan Wound Cleansing: Wound #1 Midline Coccyx: Clean wound with Normal Saline. May Shower, gently pat wound dry prior to applying new dressing. Anesthetic: Wound #1 Midline  CoccyxVALITA, RIGHTER ([id number]) Topical Lidocaine 4% cream applied to wound bed prior to debridement - in clinic Skin Barriers/Peri-Wound Care: Wound #1 Midline Coccyx: Skin Prep Triamcinolone Acetonide Ointment - on new reddened area on the left gluteus Primary Wound Dressing: Wound #1 Midline Coccyx: Prisma Ag Secondary Dressing: Wound #1 Midline Coccyx: Dry Gauze Boardered Foam Dressing - silicon bordered dressing due to skin breakdown Dressing Change Frequency: Wound #1 Midline Coccyx: Change dressing every day. Follow-up Appointments: Wound #1 Midline Coccyx: Return Appointment in 2 weeks. Off-Loading: Wound #1 Midline Coccyx: Roho cushion for wheelchair - HHRN please order roho cushion or gel cushion for patients wheelchair - whichever she qualifies for Turn and reposition every 2 hours Additional Orders / Instructions: Wound #1 Midline Coccyx: Increase protein intake. - please add protein supplements to patients diet Other: - please add vitamin A, vitamin C and zinc supplements to patients diet Medications-please add to medication list.: Wound #1 Midline Coccyx: Other: - Cortisone Cream at home on new reddened area on the left gluteus After review of this uncommon condition, which is more like a contact dermatitis  and there is a linear welt across the superior part of the wound more on the left side and going just to the right of the midline. since there is no open wound in this new area I have asked for some triamcinolone cream to be applied, and use an appropriate dressing which was Prisma AG in the ulcerated sacral wound. The daughter was at the bedside we'll use 1% hydrocortisone ointment at home and if this increases or goes to other sites she will contact her PCP. If all is well, they will keep their next appointment with Dr. Leanord Hawking Electronic Signature(s) Signed: 03/13/2017 4:12:22 PM By: Evlyn Kanner MD, FACS Previous Signature: 03/12/2017 4:16:59 PM Version By: Evlyn Kanner MD, FACS NYRIE, SIGAL ([id number]) Entered By: Evlyn Kanner on 03/13/2017 16:12:21 [name] ([id number]) -------------------------------------------------------------------------------- SuperBill Details Patient Name: [name] Date of Service: 03/12/2017 Medical Record Number: [id number] Patient Account Number: [id number] Date of Birth/Sex: Nov 02, 1922 (83 y.o. Female) Treating RN: Ashok Cordia, Debi Primary Care Provider: Vonita Moss Other Clinician: Referring Provider: Vonita Moss Treating Provider/Extender: Rudene Re in Treatment: 9 Diagnosis Coding ICD-10 Codes Code Description L89.150 Pressure ulcer of sacral region, unstageable G20 Parkinson's disease Facility Procedures CPT4 Code: [id number] Description: 99213 - WOUND CARE VISIT-LEV 3 EST PT Modifier: Quantity: 1 Physician Procedures CPT4 Code: [id number] Description: 99213 - WC PHYS LEVEL 3 - EST PT ICD-10 Description Diagnosis L89.150 Pressure ulcer of sacral region, unstageable G20 Parkinson's disease Modifier: Quantity: 1 Electronic Signature(s) Signed: 03/12/2017 5:25:16 PM By: Alejandro Mulling Signed: 03/13/2017 4:13:16 PM By: Evlyn Kanner MD, FACS Previous Signature: 03/12/2017 4:17:11 PM Version By: Evlyn Kanner MD,  FACS Entered By: Alejandro Mulling on 03/12/2017 16:31:24

## 2017-03-16 ENCOUNTER — Telehealth: Payer: Self-pay | Admitting: Family Medicine

## 2017-03-16 NOTE — Telephone Encounter (Signed)
Routing to provider for FYI

## 2017-03-16 NOTE — Telephone Encounter (Signed)
[name] wanted to let you know she went directly to the wound care center but the provider the patient normally seen was not there so they scheduled her to see him in the early AM tomorrow.    She wanted to let Fleet ContrasRachel know because she looked at the picture she brought by this morning.  Thanks

## 2017-03-17 ENCOUNTER — Encounter: Payer: Medicare Other | Admitting: Internal Medicine

## 2017-03-17 ENCOUNTER — Other Ambulatory Visit
Admission: RE | Admit: 2017-03-17 | Discharge: 2017-03-17 | Disposition: A | Discharge status: Home / Self Care | Payer: Medicare Other | Source: Ambulatory Visit | Attending: Internal Medicine | Admitting: Internal Medicine

## 2017-03-17 DIAGNOSIS — X58XXXA Exposure to other specified factors, initial encounter: Secondary | ICD-10-CM | POA: Insufficient documentation

## 2017-03-17 DIAGNOSIS — L089 Local infection of the skin and subcutaneous tissue, unspecified: Secondary | ICD-10-CM | POA: Insufficient documentation

## 2017-03-17 DIAGNOSIS — L89150 Pressure ulcer of sacral region, unstageable: Secondary | ICD-10-CM | POA: Diagnosis not present

## 2017-03-17 DIAGNOSIS — S3091XA Unspecified superficial injury of lower back and pelvis, initial encounter: Secondary | ICD-10-CM | POA: Insufficient documentation

## 2017-03-18 ENCOUNTER — Ambulatory Visit: Payer: Medicare Other | Admitting: Internal Medicine

## 2017-03-18 NOTE — Progress Notes (Signed)
Tina, [name] ([id number]) Visit Report for 03/17/2017 Chief Complaint Document Details Patient Name: [name], [name] Date of Service: 03/17/2017 8:30 AM Medical Record Patient Account Number: [id number] Number: Treating RN: Huel Coventry [birth year] (83 y.o. Other Clinician: Date of Birth/Sex: Female) Treating ROBSON, MICHAEL Primary Care Provider: Vonita Moss Provider/Extender: G Referring Provider: Vonita Moss Weeks in Treatment: 9 Information Obtained from: Patient Chief Complaint 01/07/17; patient is here for review of a sacral pressure ulcer Electronic Signature(s) Signed: 03/17/2017 4:47:51 PM By: Baltazar Najjar MD Entered By: Baltazar Najjar on 03/17/2017 09:21:53 [name] ([id number]) -------------------------------------------------------------------------------- HPI Details Patient Name: [name] Date of Service: 03/17/2017 8:30 AM Medical Record Patient Account Number: [id number] Number: Treating RN: Huel Coventry 09-21-1923 (83 y.o. Other Clinician: Date of Birth/Sex: Female) Treating ROBSON, MICHAEL Primary Care Provider: Vonita Moss Provider/Extender: G Referring Provider: Vonita Moss Weeks in Treatment: 9 History of Present Illness HPI Description: 01/07/17 this is a 83 year old woman admitted to the clinic today for review of a pressure ulcer on her lower sacrum. She is referred from her primary physician's office after being seen on 3/22 with a 3 cm pressure area. Her daughter and caretaker accompanied her today state that the area first became obvious about a month ago and his since deteriorated. They have recently got [name] a home health involved and have been using [name] to the wound. They have ordered a pressure relief surface for her mattress. They are turning her religiously. They state that she eats well and they've been forcing fluids on her. She is on a multivitamin. Looking through [facility] point last  albumin I see was 4.4 on 10/28. The patient has advanced parkinsonism which looks superficially like advanced Parkinson's disease although her daughter tells me she did not ever respond to Sinemet therefore this may have another pathology with signs of parkinsonism. However I think this is largely a mute point currently. She also has dementia and is nonambulatory. Since this started they have been keeping her in bed and turning her religiously every 2 hours. She lives at home in [place] with her husband with 24/7 care giving 01/13/17 santyl change qd. Still will require further debridement. continue santyl. 01/20/17; patient's wound actually looks some better less adherent necrotic surface. There is actually visible granulation. We're using [name] 01/27/17; better-looking surface but still a lot of necrotic tissue on the base of this wound. The periwound erythema is better than last week we are still using [name]. Her daughter tells [place] that she is still having trouble with the pressure-relief mattress through medical modalities 02/03/18; I had the patient scheduled for a two week followup however her daughter brought her in early concerned for discoloration on 2 areas of the wound circumference. We have bee using santyl 02/10/17;Better looking surface to the wound. Rim appears better suggesting better offloading. Using santyl 02/24/17; change to Silver Collegen last time. Wound appears better. 03/10/17; still using silver collagen religious offloading. Intake is satisfactory per her daughter. Dimension slightly better 03/12/2017 -- Dr. Jannetta Quint patient who had been seen 2 days ago and was doing fairly well. The patient is brought in by her daughter who noticed a new wound just above the previous wound on her sacral area and going on more to the left lateral side. She was very concerned and we asked her to get in for an opinion. 03/17/17; above is noted. The patient has developed a progressive area to  the left of her original wound. This seems to this  started with a ring of red skin with a more pale interior almost looking fungal. There was a rim of blister through part of the area although this did not look like zoster. They have been applying triamcinolone that was prescribed last week by Dr. Meyer Russel and the area has a fold to a linear band area which is confluent, erythematous and with obvious epidermal swelling but there is no overt tenderness or crepitus. She has lost some surface epithelium closer to the wound surface itself and now has a more superficial wound in this area and the extending erythema goes towards the left buttock. This is well demarcated BREZLYN, MANRIQUE. ([id number]) between involved in normal skin but once again does not appear to be at all tender. If there is a contact issue here I cannot get the history out of the daughter or the caregiver that are with her. Electronic Signature(s) Signed: 03/17/2017 4:47:51 PM By: Baltazar Najjar MD Entered By: Baltazar Najjar on 03/17/2017 09:38:24 [name] ([id number]) -------------------------------------------------------------------------------- Physical Exam Details Patient Name: [name] Date of Service: 03/17/2017 8:30 AM Medical Record Patient Account Number: [id number] Number: Treating RN: Huel Coventry [birth year] (83 y.o. Other Clinician: Date of Birth/Sex: Female) Treating ROBSON, MICHAEL Primary Care Provider: Vonita Moss Provider/Extender: G Referring Provider: Vonita Moss Weeks in Treatment: 9 Constitutional Patient is hypertensive.. Pulse regular and within target range for patient.Marland Kitchen Respirations regular, non-labored and within target range.. Temperature is normal and within the target range for the patient.. Patient appears frail but is not acutely medically ill. Eyes Conjunctivae clear. No discharge. Respiratory Respiratory effort is easy and symmetric bilaterally. Rate is  normal at rest and on room air.. Integumentary (Hair, Skin) No systemic rash. Psychiatric Advanced dementia but no major changes. Notes Wound exam; the original wound appears to have more surface slough and nonviable tissue than a week ago. Progressing into the left upper gluteal is a well demarcated band of erythema. Closer to the wound this has denuded surface epithelium to identify another wound. Further laterally there is well demarcated erythematous skin with edema but no tenderness or warmth obvious. This is well separated from normal skin. Electronic Signature(s) Signed: 03/17/2017 4:47:51 PM By: Baltazar Najjar MD Entered By: Baltazar Najjar on 03/17/2017 09:41:12 [name] ([id number]) -------------------------------------------------------------------------------- Physician Orders Details Patient Name: [name] Date of Service: 03/17/2017 8:30 AM Medical Record Patient Account Number: [id number] Number: Treating RN: Curtis Sites Feb 09, 1923 (83 y.o. Other Clinician: Date of Birth/Sex: Female) Treating ROBSON, MICHAEL Primary Care Provider: Vonita Moss Provider/Extender: G Referring Provider: Vonita Moss Weeks in Treatment: 9 Verbal / Phone Orders: No Diagnosis Coding Wound Cleansing Wound #1 Midline Coccyx o Clean wound with Normal Saline. o May Shower, gently pat wound dry prior to applying new dressing. Wound #2 Left Gluteus o Clean wound with Normal Saline. o May Shower, gently pat wound dry prior to applying new dressing. Anesthetic Wound #1 Midline Coccyx o Topical Lidocaine 4% cream applied to wound bed prior to debridement - in clinic Wound #2 Left Gluteus o Topical Lidocaine 4% cream applied to wound bed prior to debridement - in clinic Skin Barriers/Peri-Wound Care Wound #1 Midline Coccyx o Skin Prep Wound #2 Left Gluteus o Skin Prep Primary Wound Dressing Wound #1 Midline Coccyx o Aquacel Ag Wound  #2 Left Gluteus o Aquacel Ag Secondary Dressing Wound #1 Midline Coccyx o Dry Gauze o Boardered Foam Dressing - silicone bordered dressing due to skin breakdown VIVIAN, NEUWIRTH ([id number]) Wound #2 Left  Gluteus o Dry Gauze o Boardered Foam Dressing - silicone bordered dressing due to skin breakdown Dressing Change Frequency Wound #1 Midline Coccyx o Change dressing every day. Wound #2 Left Gluteus o Change dressing every day. Follow-up Appointments Wound #1 Midline Coccyx o Return Appointment in 1 week. Wound #2 Left Gluteus o Return Appointment in 1 week. Off-Loading Wound #1 Midline Coccyx o Roho cushion for wheelchair - [facility]N please order roho cushion or gel cushion for patients wheelchair - whichever she qualifies for o Turn and reposition every 2 hours Wound #2 Left Gluteus o Roho cushion for wheelchair - [facility] please order roho cushion or gel cushion for patients wheelchair - whichever she qualifies for o Turn and reposition every 2 hours Additional Orders / Instructions Wound #1 Midline Coccyx o Increase protein intake. - please add protein supplements to patients diet o Other: - please add vitamin A, vitamin C and zinc supplements to patients diet Wound #2 Left Gluteus o Increase protein intake. - please add protein supplements to patients diet o Other: - please add vitamin A, vitamin C and zinc supplements to patients diet Medications-please add to medication list. Wound #1 Midline Coccyx o P.O. Antibiotics o Other: - lotrisone cream, oral antifungal Wound #2 Left Gluteus o P.O. Antibiotics o Other: - lotrisone cream, oral antifungal ANTANASIA, KACZYNSKI ([id number]) Laboratory o Bacteria identified in Wound by Culture (MICRO) - midline coccyx oooo LOINC Code: 6462-6 oooo Convenience Name: Wound culture routine Patient Medications Allergies: Phenergan, Sulfa (Sulfonamide Antibiotics) Notifications Medication Indication  Start End Lotrisone with dialy 03/17/2017 dressing change DOSE topical 1 %-0.05 % cream - cream topical Diflucan 03/17/2017 DOSE qd - oral 100 mg tablet - qd tablet oral doxycycline monohydrate 03/17/2017 DOSE bid - oral 100 mg capsule - bid capsule oral Electronic Signature(s) Signed: 03/17/2017 9:49:04 AM By: Baltazar Najjar MD Previous Signature: 03/17/2017 9:47:10 AM Version By: Baltazar Najjar MD Previous Signature: 03/17/2017 9:43:48 AM Version By: Baltazar Najjar MD Entered By: Baltazar Najjar on 03/17/2017 09:49:03 [name] ([id number]) -------------------------------------------------------------------------------- Problem List Details Patient Name: [name] Date of Service: 03/17/2017 8:30 AM Medical Record Patient Account Number: [id number] Number: Treating RN: Huel Coventry January 06, 1923 (83 y.o. Other Clinician: Date of Birth/Sex: Female) Treating ROBSON, MICHAEL Primary Care Provider: Vonita Moss Provider/Extender: G Referring Provider: Vonita Moss Weeks in Treatment: 9 Active Problems ICD-10 Encounter Code Description Active Date Diagnosis L89.150 Pressure ulcer of sacral region, unstageable 01/07/2017 Yes G20 Parkinson's disease 01/07/2017 Yes Inactive Problems Resolved Problems Electronic Signature(s) Signed: 03/17/2017 4:47:51 PM By: Baltazar Najjar MD Entered By: Baltazar Najjar on 03/17/2017 09:21:17 [name] ([id number]) -------------------------------------------------------------------------------- Progress Note Details Patient Name: [name] Date of Service: 03/17/2017 8:30 AM Medical Record Patient Account Number: [id number] Number: Treating RN: Huel Coventry Sep 10, 1923 (83 y.o. Other Clinician: Date of Birth/Sex: Female) Treating ROBSON, MICHAEL Primary Care Provider: Vonita Moss Provider/Extender: G Referring Provider: Vonita Moss Weeks in Treatment: 9 Subjective Chief  Complaint Information obtained from Patient 01/07/17; patient is here for review of a sacral pressure ulcer History of Present Illness (HPI) 01/07/17 this is a 83 year old woman admitted to the clinic today for review of a pressure ulcer on her lower sacrum. She is referred from her primary physician's office after being seen on 3/22 with a 3 cm pressure area. Her daughter and caretaker accompanied her today state that the area first became obvious about a month ago and his since deteriorated. They have recently got [name] a home health involved and have  been using [name] to the wound. They have ordered a pressure relief surface for her mattress. They are turning her religiously. They state that she eats well and they've been forcing fluids on her. She is on a multivitamin. Looking through [facility] point last albumin I see was 4.4 on 10/28. The patient has advanced parkinsonism which looks superficially like advanced Parkinson's disease although her daughter tells me she did not ever respond to Sinemet therefore this may have another pathology with signs of parkinsonism. However I think this is largely a mute point currently. She also has dementia and is nonambulatory. Since this started they have been keeping her in bed and turning her religiously every 2 hours. She lives at home in [place] with her husband with 24/7 care giving 01/13/17 santyl change qd. Still will require further debridement. continue santyl. 01/20/17; patient's wound actually looks some better less adherent necrotic surface. There is actually visible granulation. We're using [name] 01/27/17; better-looking surface but still a lot of necrotic tissue on the base of this wound. The periwound erythema is better than last week we are still using [name]. Her daughter tells [place] that she is still having trouble with the pressure-relief mattress through medical modalities 02/03/18; I had the patient scheduled for a two week followup  however her daughter brought her in early concerned for discoloration on 2 areas of the wound circumference. We have bee using santyl 02/10/17;Better looking surface to the wound. Rim appears better suggesting better offloading. Using santyl 02/24/17; change to Silver Collegen last time. Wound appears better. 03/10/17; still using silver collagen religious offloading. Intake is satisfactory per her daughter. Dimension slightly better 03/12/2017 -- Dr. Jannetta Quint patient who had been seen 2 days ago and was doing fairly well. The patient is brought in by her daughter who noticed a new wound just above the previous wound on her sacral area and going on more to the left lateral side. She was very concerned and we asked her to get in for an opinion. 03/17/17; above is noted. The patient has developed a progressive area to the left of her original wound. This seems to this started with a ring of red skin with a more pale interior almost looking fungal. There was a rim YASLYN, CUMBY. ([id number]) of blister through part of the area although this did not look like zoster. They have been applying triamcinolone that was prescribed last week by Dr. Meyer Russel and the area has a fold to a linear band area which is confluent, erythematous and with obvious epidermal swelling but there is no overt tenderness or crepitus. She has lost some surface epithelium closer to the wound surface itself and now has a more superficial wound in this area and the extending erythema goes towards the left buttock. This is well demarcated between involved in normal skin but once again does not appear to be at all tender. If there is a contact issue here I cannot get the history out of the daughter or the caregiver that are with her. Objective Constitutional Patient is hypertensive.. Pulse regular and within target range for patient.Marland Kitchen Respirations regular, non-labored and within target range.. Temperature is normal and within the  target range for the patient.. Patient appears frail but is not acutely medically ill. Vitals Time Taken: 8:44 AM, Temperature: 99.2 F, Pulse: 64 bpm, Respiratory Rate: 16 breaths/min, Blood Pressure: 156/95 mmHg. Eyes Conjunctivae clear. No discharge. Respiratory Respiratory effort is easy and symmetric bilaterally. Rate is normal at rest and on room  air.. Psychiatric Advanced dementia but no major changes. General Notes: Wound exam; the original wound appears to have more surface slough and nonviable tissue than a week ago. Progressing into the left upper gluteal is a well demarcated band of erythema. Closer to the wound this has denuded surface epithelium to identify another wound. Further laterally there is well demarcated erythematous skin with edema but no tenderness or warmth obvious. This is well separated from normal skin. Integumentary (Hair, Skin) No systemic rash. Wound #1 status is Open. Original cause of wound was Pressure Injury. The wound is located on the Midline Coccyx. The wound measures 4.2cm length x 3.6cm width x 0.9cm depth; 11.875cm^2 area and 10.688cm^3 volume. There is Fat Layer (Subcutaneous Tissue) Exposed exposed. There is no tunneling or undermining noted. There is a large amount of serous drainage noted. The wound margin is flat and intact. There is small (1-33%) red granulation within the wound bed. There is a large (67-100%) amount of necrotic tissue within the wound bed including Adherent Slough. The periwound skin appearance exhibited: Induration, Erythema. The periwound skin appearance did not exhibit: Callus, Crepitus, Excoriation, Rash, Tina MainlandSHAW, Conna W. ([id number]) Scarring, Dry/Scaly, Maceration, Atrophie Blanche, Cyanosis, Ecchymosis, Hemosiderin Staining, Mottled, Pallor, Rubor. The surrounding wound skin color is noted with erythema which is circumferential. Periwound temperature was noted as No Abnormality. The periwound has tenderness on  palpation. Wound #2 status is Open. Original cause of wound was Pressure Injury. The wound is located on the Left Gluteus. The wound measures 2.7cm length x 4.1cm width x 0.1cm depth; 8.694cm^2 area and 0.869cm^3 volume. There is no tunneling or undermining noted. There is a large amount of serous drainage noted. The wound margin is flat and intact. There is large (67-100%) pink granulation within the wound bed. There is no necrotic tissue within the wound bed. The periwound skin appearance did not exhibit: Callus, Crepitus, Excoriation, Induration, Rash, Scarring, Dry/Scaly, Maceration, Atrophie Blanche, Cyanosis, Ecchymosis, Hemosiderin Staining, Mottled, Pallor, Rubor, Erythema. Periwound temperature was noted as No Abnormality. Assessment Active Problems ICD-10 L89.150 - Pressure ulcer of sacral region, unstageable G20 - Parkinson's disease Plan Wound Cleansing: Wound #1 Midline Coccyx: Clean wound with Normal Saline. May Shower, gently pat wound dry prior to applying new dressing. Wound #2 Left Gluteus: Clean wound with Normal Saline. May Shower, gently pat wound dry prior to applying new dressing. Anesthetic: Wound #1 Midline Coccyx: Topical Lidocaine 4% cream applied to wound bed prior to debridement - in clinic Wound #2 Left Gluteus: Topical Lidocaine 4% cream applied to wound bed prior to debridement - in clinic Skin Barriers/Peri-Wound Care: Wound #1 Midline Coccyx: Skin Prep Wound #2 Left Gluteus: Skin Prep Primary Wound Dressing: Wound #1 Midline Coccyx: Tina MainlandSHAW, Majorie W. ([id number]) Aquacel Ag Wound #2 Left Gluteus: Aquacel Ag Secondary Dressing: Wound #1 Midline Coccyx: Dry Gauze Boardered Foam Dressing - silicone bordered dressing due to skin breakdown Wound #2 Left Gluteus: Dry Gauze Boardered Foam Dressing - silicone bordered dressing due to skin breakdown Dressing Change Frequency: Wound #1 Midline Coccyx: Change dressing every day. Wound #2 Left  Gluteus: Change dressing every day. Follow-up Appointments: Wound #1 Midline Coccyx: Return Appointment in 1 week. Wound #2 Left Gluteus: Return Appointment in 1 week. Off-Loading: Wound #1 Midline Coccyx: Roho cushion for wheelchair - HHRN please order roho cushion or gel cushion for patients wheelchair - whichever she qualifies for Turn and reposition every 2 hours Wound #2 Left Gluteus: [facility] or gel  cushion for patients wheelchair - whichever she qualifies for Turn and reposition every 2 hours Additional Orders / Instructions: Wound #1 Midline Coccyx: Increase protein intake. - please add protein supplements to patients diet Other: - please add vitamin A, vitamin C and zinc supplements to patients diet Wound #2 Left Gluteus: Increase protein intake. - please add protein supplements to patients diet Other: - please add vitamin A, vitamin C and zinc supplements to patients diet Medications-please add to medication list.: Wound #1 Midline Coccyx: P.O. Antibiotics Other: - lotrisone cream, oral antifungal Wound #2 Left Gluteus: P.O. Antibiotics Other: - lotrisone cream, oral antifungal Laboratory ordered were: Wound culture routine - midline coccyx The following medication(s) was prescribed: Lotrisone topical 1 %-0.05 % cream cream topical for with dialy dressing change starting 03/17/2017 Diflucan oral 100 mg tablet qd qd tablet oral starting 03/17/2017 doxycycline monohydrate oral 100 mg capsule bid bid capsule oral starting 03/17/2017 [name] ([id number]) #1 I am really not sure what has caused this. It was surmised last week that this was contact dermatitis however if that is true the exact contacting agent is completely unclear and would have to be very well demarcated and its application. I have asked daughter and the caregiver and they can't think of anything that would do this. #2 the pictures from last week  looked like a tinea infection with red rings with central pallor. This has now become a lot more confluent and cause tissue damage. However this is not overtly tender making a bacterial infection less likely #3 I cannot get the pictures that would make me think of herpes zoster although there was some blisters early on in this #4 I'm going to treat her with topical Lotrisone, doxycycline and Diflucan the latter to both for 5 days Electronic Signature(s) Signed: 03/17/2017 4:47:51 PM By: Baltazar Najjar MD Entered By: Baltazar Najjar on 03/17/2017 09:50:48 [name] ([id number]) -------------------------------------------------------------------------------- SuperBill Details Patient Name: [name] Date of Service: 03/17/2017 Medical Record Patient Account Number: [id number] Number: Treating RN: Huel Coventry Nov 08, 1922 (83 y.o. Other Clinician: Date of Birth/Sex: Female) Treating ROBSON, MICHAEL Primary Care Provider: Vonita Moss Provider/Extender: G Referring Provider: Vonita Moss Weeks in Treatment: 9 Diagnosis Coding ICD-10 Codes Code Description L89.150 Pressure ulcer of sacral region, unstageable G20 Parkinson's disease Facility Procedures CPT4 Code: [id number] Description: 99214 - WOUND CARE VISIT-LEV 4 EST PT Modifier: Quantity: 1 Physician Procedures CPT4 Code: [id number] Description: 99213 - WC PHYS LEVEL 3 - EST PT ICD-10 Description Diagnosis L89.150 Pressure ulcer of sacral region, unstageable Modifier: Quantity: 1 Electronic Signature(s) Signed: 03/17/2017 4:47:51 PM By: Baltazar Najjar MD Entered By: Baltazar Najjar on 03/17/2017 09:51:14

## 2017-03-18 NOTE — Progress Notes (Signed)
NICO, ROGNESS ([id number]) Visit Report for 03/17/2017 Arrival Information Details Patient Name: [name], ECKERT Date of Service: 03/17/2017 8:30 AM Medical Record Patient Account Number: [id number] Number: Treating RN: Curtis Sites Jul 30, 1923 (83 y.o. Other Clinician: Date of Birth/Sex: Female) Treating ROBSON, MICHAEL Primary Care Alissa Pharr: Vonita Moss Mailynn Everly/Extender: G Referring Juanita Devincent: Vonita Moss Weeks in Treatment: 9 Visit Information History Since Last Visit Added or deleted any medications: No Patient Arrived: Wheel Chair Any new allergies or adverse No reactions: Arrival Time: 08:42 Had a fall or experienced change in No Accompanied By: daughter activities of daily living that may Transfer Assistance: Manual affect Patient Identification Verified: Yes risk of falls: Secondary Verification Process Yes Signs or symptoms of abuse/neglect No Completed: since last visito Patient Requires Transmission-Based No Hospitalized since last visit: No Precautions: Has Dressing in Place as Yes Patient Has Alerts: No Prescribed: Pain Present Now: Unable to Respond Electronic Signature(s) Signed: 03/17/2017 5:17:10 PM By: Curtis Sites Entered By: Curtis Sites on 03/17/2017 08:44:25 [name] ([id number]) -------------------------------------------------------------------------------- Clinic Level of Care Assessment Details Patient Name: [name] Date of Service: 03/17/2017 8:30 AM Medical Record Patient Account Number: [id number] Number: Treating RN: Curtis Sites Feb 01, 1923 (83 y.o. Other Clinician: Date of Birth/Sex: Female) Treating ROBSON, MICHAEL Primary Care Tyjuan Demetro: Vonita Moss Novie Maggio/Extender: G Referring Antanisha Mohs: Vonita Moss Weeks in Treatment: 9 Clinic Level of Care Assessment Items TOOL 4 Quantity Score []  - Use when only an EandM is performed on FOLLOW-UP visit 0 ASSESSMENTS - Nursing  Assessment / Reassessment X - Reassessment of Co-morbidities (includes updates in patient status) 1 10 X - Reassessment of Adherence to Treatment Plan 1 5 ASSESSMENTS - Wound and Skin Assessment / Reassessment []  - Simple Wound Assessment / Reassessment - one wound 0 X - Complex Wound Assessment / Reassessment - multiple wounds 2 5 []  - Dermatologic / Skin Assessment (not related to wound area) 0 ASSESSMENTS - Focused Assessment []  - Circumferential Edema Measurements - multi extremities 0 []  - Nutritional Assessment / Counseling / Intervention 0 []  - Lower Extremity Assessment (monofilament, tuning fork, pulses) 0 []  - Peripheral Arterial Disease Assessment (using hand held doppler) 0 ASSESSMENTS - Ostomy and/or Continence Assessment and Care []  - Incontinence Assessment and Management 0 []  - Ostomy Care Assessment and Management (repouching, etc.) 0 PROCESS - Coordination of Care X - Simple Patient / Family Education for ongoing care 1 15 []  - Complex (extensive) Patient / Family Education for ongoing care 0 []  - Staff obtains Chiropractor, Records, Test Results / Process Orders 0 []  - Staff telephones HHA, Nursing Homes / Clarify orders / etc 0 DEZI, BRAUNER ([id number]) []  - Routine Transfer to another Facility (non-emergent condition) 0 []  - Routine Hospital Admission (non-emergent condition) 0 []  - New Admissions / Manufacturing engineer / Ordering NPWT, Apligraf, etc. 0 []  - Emergency Hospital Admission (emergent condition) 0 X - Simple Discharge Coordination 1 10 []  - Complex (extensive) Discharge Coordination 0 PROCESS - Special Needs []  - Pediatric / Minor Patient Management 0 []  - Isolation Patient Management 0 []  - Hearing / Language / Visual special needs 0 []  - Assessment of Community assistance (transportation, D/C planning, etc.) 0 X - Additional assistance / Altered mentation 1 15 []  - Support Surface(s) Assessment (bed, cushion, seat, etc.) 0 INTERVENTIONS - Wound  Cleansing / Measurement []  - Simple Wound Cleansing - one wound 0 X - Complex Wound Cleansing - multiple wounds 2 5 X - Wound Imaging (photographs - any number of  wounds) 1 5 []  - Wound Tracing (instead of photographs) 0 []  - Simple Wound Measurement - one wound 0 X - Complex Wound Measurement - multiple wounds 2 5 INTERVENTIONS - Wound Dressings X - Small Wound Dressing one or multiple wounds 2 10 []  - Medium Wound Dressing one or multiple wounds 0 []  - Large Wound Dressing one or multiple wounds 0 []  - Application of Medications - topical 0 []  - Application of Medications - injection 0 ISOBELLE, TUCKETT. ([id number]) INTERVENTIONS - Miscellaneous []  - External ear exam 0 []  - Specimen Collection (cultures, biopsies, blood, body fluids, etc.) 0 X - Specimen(s) / Culture(s) sent or taken to Lab for analysis 1 5 []  - Patient Transfer (multiple staff / Michiel Sites Lift / Similar devices) 0 []  - Simple Staple / Suture removal (25 or less) 0 []  - Complex Staple / Suture removal (26 or more) 0 []  - Hypo / Hyperglycemic Management (close monitor of Blood Glucose) 0 []  - Ankle / Brachial Index (ABI) - do not check if billed separately 0 X - Vital Signs 1 5 Has the patient been seen at the hospital within the last three years: Yes Total Score: 120 Level Of Care: New/Established - Level 4 Electronic Signature(s) Signed: 03/17/2017 5:17:10 PM By: Curtis Sites Entered By: Curtis Sites on 03/17/2017 09:47:48 [name] ([id number]) -------------------------------------------------------------------------------- Encounter Discharge Information Details Patient Name: [name] Date of Service: 03/17/2017 8:30 AM Medical Record Patient Account Number: [id number] Number: Treating RN: Curtis Sites 11/01/1922 (83 y.o. Other Clinician: Date of Birth/Sex: Female) Treating ROBSON, MICHAEL Primary Care Russie Gulledge: Vonita Moss Murphy Duzan/Extender: G Referring Deondray Ospina: Vonita Moss Weeks in Treatment: 9 Encounter Discharge Information Items Discharge Pain Level: 0 Discharge Condition: Stable Ambulatory Status: Wheelchair Discharge Destination: Home Transportation: Private Auto Accompanied By: dtr Schedule Follow-up Appointment: Yes Medication Reconciliation completed and provided to Patient/Care No Jeyden Coffelt: Provided on Clinical Summary of Care: 03/17/2017 Form Type Recipient Paper Patient ES Electronic Signature(s) Signed: 03/17/2017 9:26:08 AM By: Gwenlyn Perking Entered By: Gwenlyn Perking on 03/17/2017 09:26:08 [name] ([id number]) -------------------------------------------------------------------------------- Multi Wound Chart Details Patient Name: [name] Date of Service: 03/17/2017 8:30 AM Medical Record Patient Account Number: [id number] Number: Treating RN: Curtis Sites 1923-05-01 (83 y.o. Other Clinician: Date of Birth/Sex: Female) Treating ROBSON, MICHAEL Primary Care Vy Badley: Vonita Moss Janiah Devinney/Extender: G Referring Shalayah Beagley: Vonita Moss Weeks in Treatment: 9 Vital Signs Height(in): Pulse(bpm): 64 Weight(lbs): Blood Pressure 156/95 (mmHg): Body Mass Index(BMI): Temperature(F): 99.2 Respiratory Rate 16 (breaths/min): Photos: [1:No Photos] [2:No Photos] [N/A:N/A] Wound Location: [1:Coccyx - Midline] [2:Left Gluteus] [N/A:N/A] Wounding Event: [1:Pressure Injury] [2:Pressure Injury] [N/A:N/A] Primary Etiology: [1:Pressure Ulcer] [2:Pressure Ulcer] [N/A:N/A] Comorbid History: [1:Anemia, Hypertension, Dementia] [2:Anemia, Hypertension, Dementia] [N/A:N/A] Date Acquired: [1:12/01/2016] [2:03/12/2017] [N/A:N/A] Weeks of Treatment: [1:9] [2:0] [N/A:N/A] Wound Status: [1:Open] [2:Open] [N/A:N/A] Measurements L x W x D 4.2x3.6x0.9 [2:2.7x4.1x0.1] [N/A:N/A] (cm) Area (cm) : [1:11.875] [2:8.694] [N/A:N/A] Volume (cm) : [1:10.688] [2:0.869] [N/A:N/A] % Reduction in Area: [1:-185.80%] [2:N/A]  [N/A:N/A] % Reduction in Volume: -543.10% [2:N/A] [N/A:N/A] Classification: [1:Category/Stage III] [2:Category/Stage II] [N/A:N/A] Exudate Amount: [1:Large] [2:Large] [N/A:N/A] Exudate Type: [1:Serous] [2:Serous] [N/A:N/A] Exudate Color: [1:amber] [2:amber] [N/A:N/A] Wound Margin: [1:Flat and Intact] [2:Flat and Intact] [N/A:N/A] Granulation Amount: [1:Small (1-33%)] [2:Large (67-100%)] [N/A:N/A] Granulation Quality: [1:Red] [2:Pink] [N/A:N/A] Necrotic Amount: [1:Large (67-100%)] [2:None Present (0%)] [N/A:N/A] Exposed Structures: [1:Fat Layer (Subcutaneous Tissue) Exposed: Yes Fascia: No Tendon: No Muscle: No] [2:Fascia: No Fat Layer (Subcutaneous Tissue) Exposed: No Tendon: No Muscle: No] [N/A:N/A] Joint: No Joint:  No Bone: No Bone: No Epithelialization: None None N/A Periwound Skin Texture: Induration: Yes Excoriation: No N/A Excoriation: No Induration: No Callus: No Callus: No Crepitus: No Crepitus: No Rash: No Rash: No Scarring: No Scarring: No Periwound Skin Maceration: No Maceration: No N/A Moisture: Dry/Scaly: No Dry/Scaly: No Periwound Skin Color: Erythema: Yes Atrophie Blanche: No N/A Atrophie Blanche: No Cyanosis: No Cyanosis: No Ecchymosis: No Ecchymosis: No Erythema: No Hemosiderin Staining: No Hemosiderin Staining: No Mottled: No Mottled: No Pallor: No Pallor: No Rubor: No Rubor: No Erythema Location: Circumferential N/A N/A Temperature: No Abnormality No Abnormality N/A Tenderness on Yes No N/A Palpation: Wound Preparation: Ulcer Cleansing: Ulcer Cleansing: N/A Rinsed/Irrigated with Rinsed/Irrigated with Saline Saline Topical Anesthetic Topical Anesthetic Applied: Other: lidocaine Applied: Other: lidocaine 4% 4% Treatment Notes Electronic Signature(s) Signed: 03/17/2017 4:47:51 PM By: Baltazar Najjarobson, Michael MD Entered By: Baltazar Najjarobson, Michael on 03/17/2017 09:21:43 Tina MainlandSHAW, Nyajah W.  ([id number]) -------------------------------------------------------------------------------- Multi-Disciplinary Care Plan Details Patient Name: Tina MainlandSHAW, Jenisis W. Date of Service: 03/17/2017 8:30 AM Medical Record Patient Account Number: [id number] Number: Treating RN: Curtis SitesDorthy, Joanna 1922-11-14 (83 y.o. Other Clinician: Date of Birth/Sex: Female) Treating ROBSON, MICHAEL Primary Care Ghassan Coggeshall: Vonita MossRISSMAN, MARK Giankarlo [name]/Extender: G Referring Michae Grimley: Vonita MossRISSMAN, MARK Weeks in Treatment: 9 Active Inactive ` Abuse / Safety / Falls / Self Care Management Nursing Diagnoses: Impaired physical mobility Potential for falls Goals: Patient will remain injury free Date Initiated: 01/07/2017 Target Resolution Date: 04/03/2017 Goal Status: Active Interventions: Assess fall risk on admission and as needed Notes: ` Nutrition Nursing Diagnoses: Potential for alteratiion in Nutrition/Potential for imbalanced nutrition Goals: Patient/caregiver agrees to and verbalizes understanding of need to use nutritional supplements and/or vitamins as prescribed Date Initiated: 01/07/2017 Target Resolution Date: 04/03/2017 Goal Status: Active Interventions: Assess patient nutrition upon admission and as needed per policy Notes: ` Orientation to the Wound Care Program Tina MainlandSHAW, Natally W. ([id number]) Nursing Diagnoses: Knowledge deficit related to the wound healing center program Goals: Patient/caregiver will verbalize understanding of the Wound Healing Center Program Date Initiated: 01/07/2017 Target Resolution Date: 04/03/2017 Goal Status: Active Interventions: Provide education on orientation to the wound center Notes: ` Pressure Nursing Diagnoses: Knowledge deficit related to causes and risk factors for pressure ulcer development Goals: Patient will remain free from development of additional pressure ulcers Date Initiated: 01/07/2017 Target Resolution Date: 04/03/2017 Goal Status:  Active Interventions: Assess potential for pressure ulcer upon admission and as needed Notes: ` Wound/Skin Impairment Nursing Diagnoses: Impaired tissue integrity Goals: Patient/caregiver will verbalize understanding of skin care regimen Date Initiated: 01/07/2017 Target Resolution Date: 04/03/2017 Goal Status: Active Ulcer/skin breakdown will have a volume reduction of 30% by week 4 Date Initiated: 01/07/2017 Target Resolution Date: 04/03/2017 Goal Status: Active Ulcer/skin breakdown will have a volume reduction of 50% by week 8 Date Initiated: 01/07/2017 Target Resolution Date: 04/03/2017 Goal Status: Active Ulcer/skin breakdown will have a volume reduction of 80% by week 12 Tina MainlandSHAW, Terresa W. ([id number]) Date Initiated: 01/07/2017 Target Resolution Date: 04/03/2017 Goal Status: Active Ulcer/skin breakdown will heal within 14 weeks Date Initiated: 01/07/2017 Target Resolution Date: 04/03/2017 Goal Status: Active Interventions: Assess patient/caregiver ability to obtain necessary supplies Assess patient/caregiver ability to perform ulcer/skin care regimen upon admission and as needed Assess ulceration(s) every visit Notes: Electronic Signature(s) Signed: 03/17/2017 5:17:10 PM By: Curtis Sitesorthy, Joanna Entered By: Curtis Sitesorthy, Joanna on 03/17/2017 08:58:39 Tina MainlandSHAW, Giannie W. ([id number]) -------------------------------------------------------------------------------- Pain Assessment Details Patient Name: Tina MainlandSHAW, Khalila W. Date of Service: 03/17/2017 8:30 AM Medical Record Patient Account Number: [id number] Number: Treating RN: Curtis Sitesorthy, Joanna 1922-11-14 (83 y.o.  Other Clinician: Date of Birth/Sex: Female) Treating ROBSON, MICHAEL Primary Care Dynver Clemson: Vonita Moss Jodean Valade/Extender: G Referring Jameir Ake: Vonita Moss Weeks in Treatment: 9 Active Problems Location of Pain Severity and Description of Pain Patient Has Paino Patient Unable to Respond Site Locations Pain Management  and Medication Current Pain Management: Electronic Signature(s) Signed: 03/17/2017 5:17:10 PM By: Curtis Sites Entered By: Curtis Sites on 03/17/2017 08:44:37 [name] ([id number]) -------------------------------------------------------------------------------- Patient/Caregiver Education Details Patient Name: [name] Date of Service: 03/17/2017 8:30 AM Medical Record Patient Account Number: [id number] Number: Treating RN: Curtis Sites 29-Sep-1923 (83 y.o. Other Clinician: Date of Birth/Gender: Female) Treating ROBSON, MICHAEL Primary Care Physician: Vonita Moss Physician/Extender: G Referring Physician: Veverly Fells in Treatment: 9 Education Assessment Education Provided To: Caregiver Education Topics Provided Wound/Skin Impairment: Handouts: Other: wound care as ordered Methods: Demonstration, Explain/Verbal Responses: State content correctly Electronic Signature(s) Signed: 03/17/2017 5:17:10 PM By: Curtis Sites Entered By: Curtis Sites on 03/17/2017 08:59:22 [name] ([id number]) -------------------------------------------------------------------------------- Wound Assessment Details Patient Name: [name] Date of Service: 03/17/2017 8:30 AM Medical Record Patient Account Number: [id number] Number: Treating RN: Curtis Sites August 12, 1923 (83 y.o. Other Clinician: Date of Birth/Sex: Female) Treating ROBSON, MICHAEL Primary Care Zackaria Burkey: Vonita Moss [place] Throckmorton/Extender: G Referring Leona Pressly: Vonita Moss Weeks in Treatment: 9 Wound Status Wound Number: 1 Primary Etiology: Pressure Ulcer Wound Location: Coccyx - Midline Wound Status: Open Wounding Event: Pressure Injury Comorbid History: Anemia, Hypertension, Dementia Date Acquired: 12/01/2016 Weeks Of Treatment: 9 Clustered Wound: No Photos Photo Uploaded By: Curtis Sites on 03/17/2017 12:55:57 Wound Measurements Length: (cm)  4.2 Width: (cm) 3.6 Depth: (cm) 0.9 Area: (cm) 11.875 Volume: (cm) 10.688 % Reduction in Area: -185.8% % Reduction in Volume: -543.1% Epithelialization: None Tunneling: No Undermining: No Wound Description Classification: Category/Stage III Wound Margin: Flat and Intact Exudate Amount: Large Exudate Type: Serous Exudate Color: amber Foul Odor After Cleansing: No Slough/Fibrino Yes Wound Bed Granulation Amount: Small (1-33%) Exposed Structure Granulation Quality: Red Fascia Exposed: No Necrotic Amount: Large (67-100%) Fat Layer (Subcutaneous Tissue) Exposed: Yes SIMONA, ROCQUE ([id number]) Necrotic Quality: Adherent Slough Tendon Exposed: No Muscle Exposed: No Joint Exposed: No Bone Exposed: No Periwound Skin Texture Texture Color No Abnormalities Noted: No No Abnormalities Noted: No Callus: No Atrophie Blanche: No Crepitus: No Cyanosis: No Excoriation: No Ecchymosis: No Induration: Yes Erythema: Yes Rash: No Erythema Location: Circumferential Scarring: No Hemosiderin Staining: No Mottled: No Moisture Pallor: No No Abnormalities Noted: No Rubor: No Dry / Scaly: No Maceration: No Temperature / Pain Temperature: No Abnormality Tenderness on Palpation: Yes Wound Preparation Ulcer Cleansing: Rinsed/Irrigated with Saline Topical Anesthetic Applied: Other: lidocaine 4%, Treatment Notes Wound #1 (Midline Coccyx) 1. Cleansed with: Clean wound with Normal Saline 2. Anesthetic Topical Lidocaine 4% cream to wound bed prior to debridement 4. Dressing Applied: Aquacel Ag 5. Secondary Dressing Applied Bordered Foam Dressing Dry Gauze Electronic Signature(s) Signed: 03/17/2017 5:17:10 PM By: Curtis Sites Entered By: Curtis Sites on 03/17/2017 08:58:29 [name] ([id number]) -------------------------------------------------------------------------------- Wound Assessment Details Patient Name: [name] Date of Service: 03/17/2017 8:30  AM Medical Record Patient Account Number: [id number] Number: Treating RN: Curtis Sites August 04, 1923 (83 y.o. Other Clinician: Date of Birth/Sex: Female) Treating ROBSON, MICHAEL Primary Care Dakarri Kessinger: Vonita Moss Atlee Kluth/Extender: G Referring Jolin Benavides: Vonita Moss Weeks in Treatment: 9 Wound Status Wound Number: 2 Primary Etiology: Pressure Ulcer Wound Location: Left Gluteus Wound Status: Open Wounding Event: Pressure Injury Comorbid History: Anemia, Hypertension, Dementia Date Acquired: 03/12/2017 Weeks Of Treatment: 0 Clustered  Wound: No Photos Photo Uploaded By: Curtis Sites on 03/17/2017 12:56:16 Wound Measurements Length: (cm) 2.7 Width: (cm) 4.1 Depth: (cm) 0.1 Area: (cm) 8.694 Volume: (cm) 0.869 % Reduction in Area: % Reduction in Volume: Epithelialization: None Tunneling: No Undermining: No Wound Description Classification: Category/Stage II Wound Margin: Flat and Intact Exudate Amount: Large Exudate Type: Serous Exudate Color: amber Foul Odor After Cleansing: No Slough/Fibrino No Wound Bed Granulation Amount: Large (67-100%) Exposed Structure Granulation Quality: Pink Fascia Exposed: No Necrotic Amount: None Present (0%) Fat Layer (Subcutaneous Tissue) Exposed: No CADEN, FATICA. ([id number]) Tendon Exposed: No Muscle Exposed: No Joint Exposed: No Bone Exposed: No Periwound Skin Texture Texture Color No Abnormalities Noted: No No Abnormalities Noted: No Callus: No Atrophie Blanche: No Crepitus: No Cyanosis: No Excoriation: No Ecchymosis: No Induration: No Erythema: No Rash: No Hemosiderin Staining: No Scarring: No Mottled: No Pallor: No Moisture Rubor: No No Abnormalities Noted: No Dry / Scaly: No Temperature / Pain Maceration: No Temperature: No Abnormality Wound Preparation Ulcer Cleansing: Rinsed/Irrigated with Saline Topical Anesthetic Applied: Other: lidocaine 4%, Treatment Notes Wound #2 (Left  Gluteus) 1. Cleansed with: Clean wound with Normal Saline 2. Anesthetic Topical Lidocaine 4% cream to wound bed prior to debridement 4. Dressing Applied: Aquacel Ag 5. Secondary Dressing Applied Bordered Foam Dressing Dry Gauze Electronic Signature(s) Signed: 03/17/2017 5:17:10 PM By: Curtis Sites Entered By: Curtis Sites on 03/17/2017 08:58:07 [name] ([id number]) -------------------------------------------------------------------------------- Vitals Details Patient Name: [name] Date of Service: 03/17/2017 8:30 AM Medical Record Patient Account Number: [id number] Number: Treating RN: Curtis Sites 1923-04-11 (83 y.o. Other Clinician: Date of Birth/Sex: Female) Treating ROBSON, MICHAEL Primary Care Dorella Laster: Vonita Moss Sheryl Saintil/Extender: G Referring Cormick Moss: Vonita Moss Weeks in Treatment: 9 Vital Signs Time Taken: 08:44 Temperature (F): 99.2 Pulse (bpm): 64 Respiratory Rate (breaths/min): 16 Blood Pressure (mmHg): 156/95 Reference Range: 80 - 120 mg / dl Electronic Signature(s) Signed: 03/17/2017 5:17:10 PM By: Curtis Sites Entered By: Curtis Sites on 03/17/2017 08:49:40

## 2017-03-19 LAB — AEROBIC CULTURE W GRAM STAIN (SUPERFICIAL SPECIMEN): Gram Stain: NONE SEEN

## 2017-03-19 LAB — AEROBIC CULTURE  (SUPERFICIAL SPECIMEN)

## 2017-03-24 ENCOUNTER — Encounter: Payer: Medicare Other | Admitting: Internal Medicine

## 2017-03-24 DIAGNOSIS — L89150 Pressure ulcer of sacral region, unstageable: Secondary | ICD-10-CM | POA: Diagnosis not present

## 2017-03-25 NOTE — Progress Notes (Signed)
Tina MainlandSHAW, Elsie W. ([id number]) Visit Report for 03/24/2017 Arrival Information Details Patient Name: Tina MainlandSHAW, Vandana W. Date of Service: 03/24/2017 12:30 PM Medical Record Patient Account Number: [id number] Number: Treating RN: Curtis SitesDorthy, Joanna 10-25-1922 (83 y.o. Other Clinician: Date of Birth/Sex: Female) Treating ROBSON, MICHAEL Primary Care Thi Klich: Vonita MossRISSMAN, MARK Bernie Fobes/Extender: G Referring Leenah Seidner: Vonita MossRISSMAN, MARK Weeks in Treatment: 10 Visit Information History Since Last Visit Added or deleted any medications: No Patient Arrived: Wheel Chair Any new allergies or adverse No reactions: Arrival Time: 12:41 Had a fall or experienced change in No Accompanied By: dtr and activities of daily living that may staff affect Transfer Assistance: Manual risk of falls: Patient Identification Verified: Yes Signs or symptoms of abuse/neglect No Secondary Verification Process Yes since last visito Completed: Hospitalized since last visit: No Patient Requires Transmission-Based No Has Dressing in Place as Yes Precautions: Prescribed: Patient Has Alerts: No Pain Present Now: Unable to Respond Electronic Signature(s) Signed: 03/24/2017 4:48:20 PM By: Curtis Sitesorthy, Joanna Entered By: Curtis Sitesorthy, Joanna on 03/24/2017 12:41:53 Tina MainlandSHAW, Westyn W. ([id number]) -------------------------------------------------------------------------------- Clinic Level of Care Assessment Details Patient Name: Tina MainlandSHAW, Kehlani W. Date of Service: 03/24/2017 12:30 PM Medical Record Patient Account Number: [id number] Number: Treating RN: Curtis SitesDorthy, Joanna 10-25-1922 (83 y.o. Other Clinician: Date of Birth/Sex: Female) Treating ROBSON, MICHAEL Primary Care Latrina Guttman: Vonita MossRISSMAN, MARK Trimaine Maser/Extender: G Referring Khalifa Knecht: Vonita MossRISSMAN, MARK Weeks in Treatment: 10 Clinic Level of Care Assessment Items TOOL 4 Quantity Score []  - Use when only an EandM is performed on FOLLOW-UP visit 0 ASSESSMENTS -  Nursing Assessment / Reassessment X - Reassessment of Co-morbidities (includes updates in patient status) 1 10 X - Reassessment of Adherence to Treatment Plan 1 5 ASSESSMENTS - Wound and Skin Assessment / Reassessment []  - Simple Wound Assessment / Reassessment - one wound 0 X - Complex Wound Assessment / Reassessment - multiple wounds 2 5 []  - Dermatologic / Skin Assessment (not related to wound area) 0 ASSESSMENTS - Focused Assessment []  - Circumferential Edema Measurements - multi extremities 0 []  - Nutritional Assessment / Counseling / Intervention 0 []  - Lower Extremity Assessment (monofilament, tuning fork, pulses) 0 []  - Peripheral Arterial Disease Assessment (using hand held doppler) 0 ASSESSMENTS - Ostomy and/or Continence Assessment and Care X - Incontinence Assessment and Management 1 10 []  - Ostomy Care Assessment and Management (repouching, etc.) 0 PROCESS - Coordination of Care []  - Simple Patient / Family Education for ongoing care 0 X - Complex (extensive) Patient / Family Education for ongoing care 1 20 []  - Staff obtains ChiropractorConsents, Records, Test Results / Process Orders 0 []  - Staff telephones HHA, Nursing Homes / Clarify orders / etc 0 Tina MainlandSHAW, Janaisha W. ([id number]) []  - Routine Transfer to another Facility (non-emergent condition) 0 []  - Routine Hospital Admission (non-emergent condition) 0 []  - New Admissions / Manufacturing engine[employer]surance Authorizations / Ordering NPWT, Apligraf, etc. 0 []  - Emergency Hospital Admission (emergent condition) 0 X - Simple Discharge Coordination 1 10 []  - Complex (extensive) Discharge Coordination 0 PROCESS - Special Needs []  - Pediatric / Minor Patient Management 0 []  - Isolation Patient Management 0 []  - Hearing / Language / Visual special needs 0 []  - Assessment of Community assistance (transportation, D/C planning, etc.) 0 []  - Additional assistance / Altered mentation 0 []  - Support Surface(s) Assessment (bed, cushion, seat, etc.) 0 INTERVENTIONS  - Wound Cleansing / Measurement []  - Simple Wound Cleansing - one wound 0 X - Complex Wound Cleansing - multiple wounds 2 5 X - Wound Imaging (photographs - any  number of wounds) 1 5 []  - Wound Tracing (instead of photographs) 0 []  - Simple Wound Measurement - one wound 0 X - Complex Wound Measurement - multiple wounds 2 5 INTERVENTIONS - Wound Dressings []  - Small Wound Dressing one or multiple wounds 0 []  - Medium Wound Dressing one or multiple wounds 0 []  - Large Wound Dressing one or multiple wounds 0 []  - Application of Medications - topical 0 []  - Application of Medications - injection 0 NATALYN, SZYMANOWSKI. ([id number]) INTERVENTIONS - Miscellaneous []  - External ear exam 0 []  - Specimen Collection (cultures, biopsies, blood, body fluids, etc.) 0 []  - Specimen(s) / Culture(s) sent or taken to Lab for analysis 0 X - Patient Transfer (multiple staff / Nurse, adult / Similar devices) 1 10 []  - Simple Staple / Suture removal (25 or less) 0 []  - Complex Staple / Suture removal (26 or more) 0 []  - Hypo / Hyperglycemic Management (close monitor of Blood Glucose) 0 []  - Ankle / Brachial Index (ABI) - do not check if billed separately 0 X - Vital Signs 1 5 Has the patient been seen at the hospital within the last three years: Yes Total Score: 105 Level Of Care: New/Established - Level 3 Electronic Signature(s) Signed: 03/24/2017 4:48:20 PM By: Curtis Sites Entered By: Curtis Sites on 03/24/2017 14:39:30 [name] ([id number]) -------------------------------------------------------------------------------- Encounter Discharge Information Details Patient Name: [name] Date of Service: 03/24/2017 12:30 PM Medical Record Patient Account Number: [id number] Number: Treating RN: Curtis Sites 08-24-1923 (83 y.o. Other Clinician: Date of Birth/Sex: Female) Treating ROBSON, MICHAEL Primary Care Deshaun Schou: Vonita Moss Amariah Kierstead/Extender: G Referring Garron Eline:  Vonita Moss Weeks in Treatment: 10 Encounter Discharge Information Items Discharge Pain Level: 0 Discharge Condition: Stable Ambulatory Status: Wheelchair Discharge Destination: Home Transportation: Private Auto Accompanied By: dtr and cg Schedule Follow-up Appointment: Yes Medication Reconciliation completed and provided to Patient/Care No Emer Onnen: Provided on Clinical Summary of Care: 03/24/2017 Form Type Recipient Paper Patient ES Electronic Signature(s) Signed: 03/24/2017 1:40:29 PM By: Gwenlyn Perking Entered By: Gwenlyn Perking on 03/24/2017 13:40:29 [name] ([id number]) -------------------------------------------------------------------------------- Multi Wound Chart Details Patient Name: [name] Date of Service: 03/24/2017 12:30 PM Medical Record Patient Account Number: [id number] Number: Treating RN: Curtis Sites 12-06-1922 (83 y.o. Other Clinician: Date of Birth/Sex: Female) Treating ROBSON, MICHAEL Primary Care Majel Giel: Vonita Moss Jaileigh Weimer/Extender: G Referring Jerryl Holzhauer: Vonita Moss Weeks in Treatment: 10 Vital Signs Height(in): Pulse(bpm): 65 Weight(lbs): Blood Pressure 174/86 (mmHg): Body Mass Index(BMI): Temperature(F): 97.6 Respiratory Rate 14 (breaths/min): Photos: [1:No Photos] [2:No Photos] [N/A:N/A] Wound Location: [1:Coccyx - Midline] [2:Left Gluteus] [N/A:N/A] Wounding Event: [1:Pressure Injury] [2:Pressure Injury] [N/A:N/A] Primary Etiology: [1:Pressure Ulcer] [2:Pressure Ulcer] [N/A:N/A] Comorbid History: [1:Anemia, Hypertension, Dementia] [2:Anemia, Hypertension, Dementia] [N/A:N/A] Date Acquired: [1:12/01/2016] [2:03/12/2017] [N/A:N/A] Weeks of Treatment: [1:10] [2:1] [N/A:N/A] Wound Status: [1:Open] [2:Open] [N/A:N/A] Measurements L x W x D 3.4x2.6x0.5 [2:3x1.5x0.1] [N/A:N/A] (cm) Area (cm) : [1:6.943] [2:3.534] [N/A:N/A] Volume (cm) : [1:3.471] [2:0.353] [N/A:N/A] % Reduction in Area:  [1:-67.10%] [2:59.40%] [N/A:N/A] % Reduction in Volume: -108.80% [2:59.40%] [N/A:N/A] Classification: [1:Category/Stage III] [2:Category/Stage II] [N/A:N/A] Exudate Amount: [1:Large] [2:Large] [N/A:N/A] Exudate Type: [1:Serous] [2:Serous] [N/A:N/A] Exudate Color: [1:amber] [2:amber] [N/A:N/A] Wound Margin: [1:Flat and Intact] [2:Flat and Intact] [N/A:N/A] Granulation Amount: [1:Small (1-33%)] [2:Large (67-100%)] [N/A:N/A] Granulation Quality: [1:Red] [2:Pink] [N/A:N/A] Necrotic Amount: [1:Large (67-100%)] [2:None Present (0%)] [N/A:N/A] Exposed Structures: [1:Fat Layer (Subcutaneous Tissue) Exposed: Yes Fascia: No Tendon: No Muscle: No] [2:Fascia: No Fat Layer (Subcutaneous Tissue) Exposed: No Tendon: No Muscle: No] [N/A:N/A]  Joint: No Joint: No Bone: No Bone: No Epithelialization: None Large (67-100%) N/A Periwound Skin Texture: Induration: Yes Excoriation: No N/A Excoriation: No Induration: No Callus: No Callus: No Crepitus: No Crepitus: No Rash: No Rash: No Scarring: No Scarring: No Periwound Skin Maceration: No Maceration: No N/A Moisture: Dry/Scaly: No Dry/Scaly: No Periwound Skin Color: Erythema: Yes Atrophie Blanche: No N/A Atrophie Blanche: No Cyanosis: No Cyanosis: No Ecchymosis: No Ecchymosis: No Erythema: No Hemosiderin Staining: No Hemosiderin Staining: No Mottled: No Mottled: No Pallor: No Pallor: No Rubor: No Rubor: No Erythema Location: Circumferential N/A N/A Temperature: No Abnormality No Abnormality N/A Tenderness on Yes No N/A Palpation: Wound Preparation: Ulcer Cleansing: Ulcer Cleansing: N/A Rinsed/Irrigated with Rinsed/Irrigated with Saline Saline Topical Anesthetic Topical Anesthetic Applied: Other: lidocaine Applied: Other: lidocaine 4% 4% Treatment Notes Electronic Signature(s) Signed: 03/24/2017 4:49:17 PM By: Baltazar Najjar MD Entered By: Baltazar Najjar on 03/24/2017 13:55:00 [name]  ([id number]) -------------------------------------------------------------------------------- Multi-Disciplinary Care Plan Details Patient Name: [name] Date of Service: 03/24/2017 12:30 PM Medical Record Patient Account Number: [id number] Number: Treating RN: Curtis Sites 11-Jan-1923 (83 y.o. Other Clinician: Date of Birth/Sex: Female) Treating ROBSON, MICHAEL Primary Care Shawn Carattini: Vonita Moss Mera Gunkel/Extender: G Referring Lashanda Storlie: Vonita Moss Weeks in Treatment: 10 Active Inactive ` Abuse / Safety / Falls / Self Care Management Nursing Diagnoses: Impaired physical mobility Potential for falls Goals: Patient will remain injury free Date Initiated: 01/07/2017 Target Resolution Date: 04/03/2017 Goal Status: Active Interventions: Assess fall risk on admission and as needed Notes: ` Nutrition Nursing Diagnoses: Potential for alteratiion in Nutrition/Potential for imbalanced nutrition Goals: Patient/caregiver agrees to and verbalizes understanding of need to use nutritional supplements and/or vitamins as prescribed Date Initiated: 01/07/2017 Target Resolution Date: 04/03/2017 Goal Status: Active Interventions: Assess patient nutrition upon admission and as needed per policy Notes: ` Orientation to the Wound Care Program NEALY, HICKMON ([id number]) Nursing Diagnoses: Knowledge deficit related to the wound healing center program Goals: Patient/caregiver will verbalize understanding of the Wound Healing Center Program Date Initiated: 01/07/2017 Target Resolution Date: 04/03/2017 Goal Status: Active Interventions: Provide education on orientation to the wound center Notes: ` Pressure Nursing Diagnoses: Knowledge deficit related to causes and risk factors for pressure ulcer development Goals: Patient will remain free from development of additional pressure ulcers Date Initiated: 01/07/2017 Target Resolution Date: 04/03/2017 Goal Status:  Active Interventions: Assess potential for pressure ulcer upon admission and as needed Notes: ` Wound/Skin Impairment Nursing Diagnoses: Impaired tissue integrity Goals: Patient/caregiver will verbalize understanding of skin care regimen Date Initiated: 01/07/2017 Target Resolution Date: 04/03/2017 Goal Status: Active Ulcer/skin breakdown will have a volume reduction of 30% by week 4 Date Initiated: 01/07/2017 Target Resolution Date: 04/03/2017 Goal Status: Active Ulcer/skin breakdown will have a volume reduction of 50% by week 8 Date Initiated: 01/07/2017 Target Resolution Date: 04/03/2017 Goal Status: Active Ulcer/skin breakdown will have a volume reduction of 80% by week 12 KAYDRA, BORGEN ([id number]) Date Initiated: 01/07/2017 Target Resolution Date: 04/03/2017 Goal Status: Active Ulcer/skin breakdown will heal within 14 weeks Date Initiated: 01/07/2017 Target Resolution Date: 04/03/2017 Goal Status: Active Interventions: Assess patient/caregiver ability to obtain necessary supplies Assess patient/caregiver ability to perform ulcer/skin care regimen upon admission and as needed Assess ulceration(s) every visit Notes: Electronic Signature(s) Signed: 03/24/2017 4:48:20 PM By: Curtis Sites Entered By: Curtis Sites on 03/24/2017 13:11:17 [name] ([id number]) -------------------------------------------------------------------------------- Pain Assessment Details Patient Name: [name] Date of Service: 03/24/2017 12:30 PM Medical Record Patient Account Number: [id number] Number: Treating RN: Francesco Sor,  Mardene Celeste [birth year] (83 y.o. Other Clinician: Date of Birth/Sex: Female) Treating ROBSON, MICHAEL Primary Care Gaylene Moylan: Vonita Moss Gabryel Files/Extender: G Referring Dara Camargo: Vonita Moss Weeks in Treatment: 10 Active Problems Location of Pain Severity and Description of Pain Patient Has Paino Patient Unable to Respond Site Locations Pain Management  and Medication Current Pain Management: Electronic Signature(s) Signed: 03/24/2017 4:48:20 PM By: Curtis Sites Entered By: Curtis Sites on 03/24/2017 12:44:16 [name] ([id number]) -------------------------------------------------------------------------------- Patient/Caregiver Education Details Patient Name: [name] Date of Service: 03/24/2017 12:30 PM Medical Record Patient Account Number: [id number] Number: Treating RN: Curtis Sites Nov 25, 1922 (83 y.o. Other Clinician: Date of Birth/Gender: Female) Treating ROBSON, MICHAEL Primary Care Physician: Vonita Moss Physician/Extender: G Referring Physician: Veverly Fells in Treatment: 10 Education Assessment Education Provided To: Caregiver Education Topics Provided Wound/Skin Impairment: Handouts: Other: wound care as ordered Methods: Demonstration, Explain/Verbal Responses: State content correctly Electronic Signature(s) Signed: 03/24/2017 4:48:20 PM By: Curtis Sites Entered By: Curtis Sites on 03/24/2017 12:46:20 [name] ([id number]) -------------------------------------------------------------------------------- Wound Assessment Details Patient Name: [name] Date of Service: 03/24/2017 12:30 PM Medical Record Patient Account Number: [id number] Number: Treating RN: Curtis Sites 10-21-1922 (83 y.o. Other Clinician: Date of Birth/Sex: Female) Treating ROBSON, MICHAEL Primary Care Cataldo Cosgriff: Vonita Moss Emmaclaire Switala/Extender: G Referring Jahmel Flannagan: Vonita Moss Weeks in Treatment: 10 Wound Status Wound Number: 1 Primary Etiology: Pressure Ulcer Wound Location: Coccyx - Midline Wound Status: Open Wounding Event: Pressure Injury Comorbid History: Anemia, Hypertension, Dementia Date Acquired: 12/01/2016 Weeks Of Treatment: 10 Clustered Wound: No Photos Photo Uploaded By: Curtis Sites on 03/24/2017 15:00:14 Wound Measurements Length: (cm)  3.4 Width: (cm) 2.6 Depth: (cm) 0.5 Area: (cm) 6.943 Volume: (cm) 3.471 % Reduction in Area: -67.1% % Reduction in Volume: -108.8% Epithelialization: None Tunneling: No Undermining: No Wound Description Classification: Category/Stage III Wound Margin: Flat and Intact Exudate Amount: Large Exudate Type: Serous Exudate Color: amber Foul Odor After Cleansing: No Slough/Fibrino Yes Wound Bed Granulation Amount: Small (1-33%) Exposed Structure Granulation Quality: Red Fascia Exposed: No Necrotic Amount: Large (67-100%) Fat Layer (Subcutaneous Tissue) Exposed: Yes ALEXZA, NORBECK ([id number]) Necrotic Quality: Adherent Slough Tendon Exposed: No Muscle Exposed: No Joint Exposed: No Bone Exposed: No Periwound Skin Texture Texture Color No Abnormalities Noted: No No Abnormalities Noted: No Callus: No Atrophie Blanche: No Crepitus: No Cyanosis: No Excoriation: No Ecchymosis: No Induration: Yes Erythema: Yes Rash: No Erythema Location: Circumferential Scarring: No Hemosiderin Staining: No Mottled: No Moisture Pallor: No No Abnormalities Noted: No Rubor: No Dry / Scaly: No Maceration: No Temperature / Pain Temperature: No Abnormality Tenderness on Palpation: Yes Wound Preparation Ulcer Cleansing: Rinsed/Irrigated with Saline Topical Anesthetic Applied: Other: lidocaine 4%, Treatment Notes Wound #1 (Midline Coccyx) 1. Cleansed with: Clean wound with Normal Saline 2. Anesthetic Topical Lidocaine 4% cream to wound bed prior to debridement 4. Dressing Applied: Aquacel Ag 5. Secondary Dressing Applied Bordered Foam Dressing Dry Gauze Electronic Signature(s) Signed: 03/24/2017 4:48:20 PM By: Curtis Sites Entered By: Curtis Sites on 03/24/2017 13:08:22 [name] ([id number]) -------------------------------------------------------------------------------- Wound Assessment Details Patient Name: [name] Date of Service: 03/24/2017 12:30  PM Medical Record Patient Account Number: [id number] Number: Treating RN: Curtis Sites 1922/12/21 (83 y.o. Other Clinician: Date of Birth/Sex: Female) Treating ROBSON, MICHAEL Primary Care Anais Denslow: Vonita Moss Shalita Notte/Extender: G Referring Hilary Milks: Vonita Moss Weeks in Treatment: 10 Wound Status Wound Number: 2 Primary Etiology: Pressure Ulcer Wound Location: Left Gluteus Wound Status: Open Wounding Event: Pressure Injury Comorbid History: Anemia, Hypertension, Dementia Date Acquired: 03/12/2017 Tania Ade  Of Treatment: 1 Clustered Wound: No Photos Photo Uploaded By: Curtis Sites on 03/24/2017 15:00:15 Wound Measurements Length: (cm) 3 Width: (cm) 1.5 Depth: (cm) 0.1 Area: (cm) 3.534 Volume: (cm) 0.353 % Reduction in Area: 59.4% % Reduction in Volume: 59.4% Epithelialization: Large (67-100%) Tunneling: No Undermining: No Wound Description Classification: Category/Stage II Wound Margin: Flat and Intact Exudate Amount: Large Exudate Type: Serous Exudate Color: amber Foul Odor After Cleansing: No Slough/Fibrino No Wound Bed Granulation Amount: Large (67-100%) Exposed Structure Granulation Quality: Pink Fascia Exposed: No Necrotic Amount: None Present (0%) Fat Layer (Subcutaneous Tissue) Exposed: No CHERYLL, KEISLER. ([id number]) Tendon Exposed: No Muscle Exposed: No Joint Exposed: No Bone Exposed: No Periwound Skin Texture Texture Color No Abnormalities Noted: No No Abnormalities Noted: No Callus: No Atrophie Blanche: No Crepitus: No Cyanosis: No Excoriation: No Ecchymosis: No Induration: No Erythema: No Rash: No Hemosiderin Staining: No Scarring: No Mottled: No Pallor: No Moisture Rubor: No No Abnormalities Noted: No Dry / Scaly: No Temperature / Pain Maceration: No Temperature: No Abnormality Wound Preparation Ulcer Cleansing: Rinsed/Irrigated with Saline Topical Anesthetic Applied: Other: lidocaine 4%, Treatment  Notes Wound #2 (Left Gluteus) 1. Cleansed with: Clean wound with Normal Saline 2. Anesthetic Topical Lidocaine 4% cream to wound bed prior to debridement 4. Dressing Applied: Aquacel Ag 5. Secondary Dressing Applied Bordered Foam Dressing Dry Gauze Electronic Signature(s) Signed: 03/24/2017 4:48:20 PM By: Curtis Sites Entered By: Curtis Sites on 03/24/2017 13:08:36 [name] ([id number]) -------------------------------------------------------------------------------- Vitals Details Patient Name: [name] Date of Service: 03/24/2017 12:30 PM Medical Record Patient Account Number: [id number] Number: Treating RN: Curtis Sites 1923/06/21 (83 y.o. Other Clinician: Date of Birth/Sex: Female) Treating ROBSON, MICHAEL Primary Care Jahmani Staup: Vonita Moss Luccia Reinheimer/Extender: G Referring Tienna Bienkowski: Vonita Moss Weeks in Treatment: 10 Vital Signs Time Taken: 12:45 Temperature (F): 97.6 Pulse (bpm): 65 Respiratory Rate (breaths/min): 14 Blood Pressure (mmHg): 174/86 Reference Range: 80 - 120 mg / dl Electronic Signature(s) Signed: 03/24/2017 4:48:20 PM By: Curtis Sites Entered By: Curtis Sites on 03/24/2017 12:45:27

## 2017-03-25 NOTE — Progress Notes (Signed)
AMERI, CAHOON ([id number]) Visit Report for 03/24/2017 Chief Complaint Document Details Patient Name: [name] Date of Service: 03/24/2017 12:30 PM Medical Record Patient Account Number: [id number] Number: Treating RN: Curtis Sites 1922-12-07 (83 y.o. Other Clinician: Date of Birth/Sex: Female) Treating Lamonte Hartt Primary Care Provider: Vonita Moss Provider/Extender: G Referring Provider: Vonita Moss Weeks in Treatment: 10 Information Obtained from: Patient Chief Complaint 01/07/17; patient is here for review of a sacral pressure ulcer Electronic Signature(s) Signed: 03/24/2017 4:49:17 PM By: Baltazar Najjar MD Entered By: Baltazar Najjar on 03/24/2017 13:55:09 [name] ([id number]) -------------------------------------------------------------------------------- HPI Details Patient Name: [name] Date of Service: 03/24/2017 12:30 PM Medical Record Patient Account Number: [id number] Number: Treating RN: Curtis Sites Apr 19, 1923 (83 y.o. Other Clinician: Date of Birth/Sex: Female) Treating Earlyne Feeser Primary Care Provider: Vonita Moss Provider/Extender: G Referring Provider: Vonita Moss Weeks in Treatment: 10 History of Present Illness HPI Description: 01/07/17 this is a 83 year old woman admitted to the clinic today for review of a pressure ulcer on her lower sacrum. She is referred from her primary physician's office after being seen on 3/22 with a 3 cm pressure area. Her daughter and caretaker accompanied her today state that the area first became obvious about a month ago and his since deteriorated. They have recently got [name] a home health involved and have been using [name] to the wound. They have ordered a pressure relief surface for her mattress. They are turning her religiously. They state that she eats well and they've been forcing fluids on her. She is on a multivitamin. Looking through [facility] point last albumin I see was 4.4 on 10/28. The patient has advanced parkinsonism which looks superficially like advanced Parkinson's disease although her daughter tells me she did not ever respond to Sinemet therefore this may have another pathology with signs of parkinsonism. However I think this is largely a mute point currently. She also has dementia and is nonambulatory. Since this started they have been keeping her in bed and turning her religiously every 2 hours. She lives at home in [place] with her husband with 24/7 care giving 01/13/17 santyl change qd. Still will require further debridement. continue santyl. 01/20/17; patient's wound actually looks some better less adherent necrotic surface. There is actually visible granulation. We're using [name] 01/27/17; better-looking surface but still a lot of necrotic tissue on the base of this wound. The periwound erythema is better than last week we are still using [name]. Her daughter tells [place] that she is still having trouble with the pressure-relief mattress through medical modalities 02/03/18; I had the patient scheduled for a two week followup however her daughter brought her in early concerned for discoloration on 2 areas of the wound circumference. We have bee using santyl 02/10/17;Better looking surface to the wound. Rim appears better suggesting better offloading. Using santyl 02/24/17; change to Silver Collegen last time. Wound appears better. 03/10/17; still using silver collagen religious offloading. Intake is satisfactory per her daughter. Dimension slightly better 03/12/2017 -- Dr. Jannetta Quint patient who had been seen 2 days ago and was doing fairly well. The patient is brought in by her daughter who noticed a new wound just above the previous wound on her sacral area and going on more to the left lateral side. She was very concerned and we asked her to get in for an opinion. 03/17/17; above is noted. The patient has developed a  progressive area to the left of her original wound. This seems to this  started with a ring of red skin with a more pale interior almost looking fungal. There was a rim of blister through part of the area although this did not look like zoster. They have been applying triamcinolone that was prescribed last week by Dr. Meyer Russel and the area has a fold to a linear band area which is confluent, erythematous and with obvious epidermal swelling but there is no overt tenderness or crepitus. She has lost some surface epithelium closer to the wound surface itself and now has a more superficial wound in this area and the extending erythema goes towards the left buttock. This is well demarcated between involved in normal skin but once again does not appear to be at all tender. If there is a contact STAPHANY, DITTON. ([id number]) issue here I cannot get the history out of the daughter or the caregiver that are with her. 03/24/17; the patient arrives today with the wound slightly worse slightly more drainage. The bandlike area of erythema that I treated as a possible pineal infection has improved somewhat although proximally is still has confluent erythema without overt tenderness. We have been using silver alginate since the most recent deterioration. To the bandlike degree of erythema we have been using Lotrisone cream Electronic Signature(s) Signed: 03/24/2017 4:49:17 PM By: Baltazar Najjar MD Entered By: Baltazar Najjar on 03/24/2017 13:56:38 [name] ([id number]) -------------------------------------------------------------------------------- Physical Exam Details Patient Name: [name] Date of Service: 03/24/2017 12:30 PM Medical Record Patient Account Number: [id number] Number: Treating RN: Curtis Sites 1922-11-01 (83 y.o. Other Clinician: Date of Birth/Sex: Female) Treating Rasheda Ledger Primary Care Provider: Vonita Moss Provider/Extender: G Referring Provider:  Vonita Moss Weeks in Treatment: 10 Constitutional Sitting or standing Blood Pressure is within target range for patient.. Pulse regular and within target range for patient.Marland Kitchen Respirations regular, non-labored and within target range.. Temperature is normal and within the target range for the patient.. Patient appears very frail. Respiratory Respiratory effort is easy and symmetric bilaterally. Rate is normal at rest and on room air.. Cardiovascular Heart rhythm and rate regular, without murmur or gallop.. Gastrointestinal (GI) Abdomen is soft and non-distended without masses or tenderness. Bowel sounds active in all quadrants.. Psychiatric Minimally responsive today.. Notes Wound exam; the original wound has perhaps more drainage. Slightly worse certainly no better. Progressing into the left gluteal fold her bandlike area of erythema from last week is faded somewhat. I'm still not exactly sure what caused this. There is no overt tenderness. Electronic Signature(s) Signed: 03/24/2017 4:49:17 PM By: Baltazar Najjar MD Entered By: Baltazar Najjar on 03/24/2017 14:01:42 [name] ([id number]) -------------------------------------------------------------------------------- Physician Orders Details Patient Name: [name] Date of Service: 03/24/2017 12:30 PM Medical Record Patient Account Number: [id number] Number: Treating RN: Curtis Sites Jan 25, 1923 (83 y.o. Other Clinician: Date of Birth/Sex: Female) Treating Melvern Ramone Primary Care Provider: Vonita Moss Provider/Extender: G Referring Provider: Vonita Moss Weeks in Treatment: 10 Verbal / Phone Orders: Yes Clinician: Curtis Sites Read Back and Verified: Yes Diagnosis Coding Wound Cleansing Wound #1 Midline Coccyx o Clean wound with Normal Saline. o May Shower, gently pat wound dry prior to applying new dressing. Wound #2 Left Gluteus o Clean wound with Normal Saline. o May  Shower, gently pat wound dry prior to applying new dressing. Anesthetic Wound #1 Midline Coccyx o Topical Lidocaine 4% cream applied to wound bed prior to debridement - in clinic Wound #2 Left Gluteus o Topical Lidocaine 4% cream applied to wound bed prior to debridement - in  clinic Skin Barriers/Peri-Wound Care Wound #1 Midline Coccyx o Skin Prep Wound #2 Left Gluteus o Skin Prep Primary Wound Dressing Wound #1 Midline Coccyx o Aquacel Ag Wound #2 Left Gluteus o Aquacel Ag Secondary Dressing Wound #1 Midline Coccyx o Dry Gauze o Boardered Foam Dressing - silicone bordered dressing due to skin breakdown GEARLDINE, LOONEY. ([id number]) Wound #2 Left Gluteus o Dry Gauze o Boardered Foam Dressing - silicone bordered dressing due to skin breakdown Dressing Change Frequency Wound #1 Midline Coccyx o Change dressing every day. Wound #2 Left Gluteus o Change dressing every day. Follow-up Appointments Wound #1 Midline Coccyx o Return Appointment in 1 week. Wound #2 Left Gluteus o Return Appointment in 1 week. Off-Loading Wound #1 Midline Coccyx o Roho cushion for wheelchair - [facility] please order roho cushion or gel cushion for patients wheelchair - whichever she qualifies for o Turn and reposition every 2 hours Wound #2 Left Gluteus o Roho cushion for wheelchair - [facility] please order roho cushion or gel cushion for patients wheelchair - whichever she qualifies for o Turn and reposition every 2 hours Additional Orders / Instructions Wound #1 Midline Coccyx o Increase protein intake. - please add protein supplements to patients diet o Other: - please add vitamin A, vitamin C and zinc supplements to patients diet Wound #2 Left Gluteus o Increase protein intake. - please add protein supplements to patients diet o Other: - please add vitamin A, vitamin C and zinc supplements to patients diet Medications-please add to medication list. Wound  #1 Midline Coccyx o P.O. Antibiotics o Other: - lotrisone cream, oral antifungal Wound #2 Left Gluteus o P.O. Antibiotics o Other: - lotrisone cream, oral antifungal KLA, BILY ([id number]) Radiology o X-ray, coccyx - and sacrum Electronic Signature(s) Signed: 03/24/2017 4:48:20 PM By: Curtis Sites Signed: 03/24/2017 4:49:17 PM By: Baltazar Najjar MD Entered By: Curtis Sites on 03/24/2017 13:14:22 [name] ([id number]) -------------------------------------------------------------------------------- Problem List Details Patient Name: [name] Date of Service: 03/24/2017 12:30 PM Medical Record Patient Account Number: [id number] Number: Treating RN: Curtis Sites [birth year] (83 y.o. Other Clinician: Date of Birth/Sex: Female) Treating Kristyne Woodring Primary Care Provider: Vonita Moss Provider/Extender: G Referring Provider: Vonita Moss Weeks in Treatment: 10 Active Problems ICD-10 Encounter Code Description Active Date Diagnosis L89.150 Pressure ulcer of sacral region, unstageable 01/07/2017 Yes G20 Parkinson's disease 01/07/2017 Yes Inactive Problems Resolved Problems Electronic Signature(s) Signed: 03/24/2017 4:49:17 PM By: Baltazar Najjar MD Entered By: Baltazar Najjar on 03/24/2017 13:54:40 [name] ([id number]) -------------------------------------------------------------------------------- Progress Note Details Patient Name: [name] Date of Service: 03/24/2017 12:30 PM Medical Record Patient Account Number: [id number] Number: Treating RN: Curtis Sites Jan 06, 1923 (83 y.o. Other Clinician: Date of Birth/Sex: Female) Treating Demani Weyrauch Primary Care Provider: Vonita Moss Provider/Extender: G Referring Provider: Vonita Moss Weeks in Treatment: 10 Subjective Chief Complaint Information obtained from Patient 01/07/17; patient is here for review of a sacral pressure  ulcer History of Present Illness (HPI) 01/07/17 this is a 83 year old woman admitted to the clinic today for review of a pressure ulcer on her lower sacrum. She is referred from her primary physician's office after being seen on 3/22 with a 3 cm pressure area. Her daughter and caretaker accompanied her today state that the area first became obvious about a month ago and his since deteriorated. They have recently got [name] a home health involved and have been using [name] to the wound. They have ordered a pressure relief surface for her mattress. They are turning  her religiously. They state that she eats well and they've been forcing fluids on her. She is on a multivitamin. Looking through [facility] point last albumin I see was 4.4 on 10/28. The patient has advanced parkinsonism which looks superficially like advanced Parkinson's disease although her daughter tells me she did not ever respond to Sinemet therefore this may have another pathology with signs of parkinsonism. However I think this is largely a mute point currently. She also has dementia and is nonambulatory. Since this started they have been keeping her in bed and turning her religiously every 2 hours. She lives at home in [place] with her husband with 24/7 care giving 01/13/17 santyl change qd. Still will require further debridement. continue santyl. 01/20/17; patient's wound actually looks some better less adherent necrotic surface. There is actually visible granulation. We're using [name] 01/27/17; better-looking surface but still a lot of necrotic tissue on the base of this wound. The periwound erythema is better than last week we are still using [name]. Her daughter tells [place] that she is still having trouble with the pressure-relief mattress through medical modalities 02/03/18; I had the patient scheduled for a two week followup however her daughter brought her in early concerned for discoloration on 2 areas of the wound  circumference. We have bee using santyl 02/10/17;Better looking surface to the wound. Rim appears better suggesting better offloading. Using santyl 02/24/17; change to Silver Collegen last time. Wound appears better. 03/10/17; still using silver collagen religious offloading. Intake is satisfactory per her daughter. Dimension slightly better 03/12/2017 -- Dr. Jannetta Quint patient who had been seen 2 days ago and was doing fairly well. The patient is brought in by her daughter who noticed a new wound just above the previous wound on her sacral area and going on more to the left lateral side. She was very concerned and we asked her to get in for an opinion. 03/17/17; above is noted. The patient has developed a progressive area to the left of her original wound. This seems to this started with a ring of red skin with a more pale interior almost looking fungal. There was a rim ELWYN, LOWDEN. ([id number]) of blister through part of the area although this did not look like zoster. They have been applying triamcinolone that was prescribed last week by Dr. Meyer Russel and the area has a fold to a linear band area which is confluent, erythematous and with obvious epidermal swelling but there is no overt tenderness or crepitus. She has lost some surface epithelium closer to the wound surface itself and now has a more superficial wound in this area and the extending erythema goes towards the left buttock. This is well demarcated between involved in normal skin but once again does not appear to be at all tender. If there is a contact issue here I cannot get the history out of the daughter or the caregiver that are with her. 03/24/17; the patient arrives today with the wound slightly worse slightly more drainage. The bandlike area of erythema that I treated as a possible pineal infection has improved somewhat although proximally is still has confluent erythema without overt tenderness. We have been using silver alginate  since the most recent deterioration. To the bandlike degree of erythema we have been using Lotrisone cream Objective Constitutional Sitting or standing Blood Pressure is within target range for patient.. Pulse regular and within target range for patient.Marland Kitchen Respirations regular, non-labored and within target range.. Temperature is normal and within the target range for  the patient.. Patient appears very frail. Vitals Time Taken: 12:45 PM, Temperature: 97.6 F, Pulse: 65 bpm, Respiratory Rate: 14 breaths/min, Blood Pressure: 174/86 mmHg. Respiratory Respiratory effort is easy and symmetric bilaterally. Rate is normal at rest and on room air.Marland Kitchen. Psychiatric Minimally responsive today.. General Notes: Wound exam; the original wound has perhaps more drainage. Slightly worse certainly no better. Progressing into the left gluteal fold her bandlike area of erythema from last week is faded somewhat. I'm still not exactly sure what caused this. There is no overt tenderness. Integumentary (Hair, Skin) Wound #1 status is Open. Original cause of wound was Pressure Injury. The wound is located on the Midline Coccyx. The wound measures 3.4cm length x 2.6cm width x 0.5cm depth; 6.943cm^2 area and 3.471cm^3 volume. There is Fat Layer (Subcutaneous Tissue) Exposed exposed. There is no tunneling or undermining noted. There is a large amount of serous drainage noted. The wound margin is flat and intact. There is small (1-33%) red granulation within the wound bed. There is a large (67-100%) amount of necrotic tissue within the wound bed including Adherent Slough. The periwound skin appearance exhibited: Induration, Erythema. The periwound skin appearance did not exhibit: Callus, Crepitus, Excoriation, Rash, Tina MainlandSHAW, Lurlean W. ([id number]) Scarring, Dry/Scaly, Maceration, Atrophie Blanche, Cyanosis, Ecchymosis, Hemosiderin Staining, Mottled, Pallor, Rubor. The surrounding wound skin color is noted with erythema  which is circumferential. Periwound temperature was noted as No Abnormality. The periwound has tenderness on palpation. Wound #2 status is Open. Original cause of wound was Pressure Injury. The wound is located on the Left Gluteus. The wound measures 3cm length x 1.5cm width x 0.1cm depth; 3.534cm^2 area and 0.353cm^3 volume. There is no tunneling or undermining noted. There is a large amount of serous drainage noted. The wound margin is flat and intact. There is large (67-100%) pink granulation within the wound bed. There is no necrotic tissue within the wound bed. The periwound skin appearance did not exhibit: Callus, Crepitus, Excoriation, Induration, Rash, Scarring, Dry/Scaly, Maceration, Atrophie Blanche, Cyanosis, Ecchymosis, Hemosiderin Staining, Mottled, Pallor, Rubor, Erythema. Periwound temperature was noted as No Abnormality. Assessment Active Problems ICD-10 L89.150 - Pressure ulcer of sacral region, unstageable G20 - Parkinson's disease Plan Wound Cleansing: Wound #1 Midline Coccyx: Clean wound with Normal Saline. May Shower, gently pat wound dry prior to applying new dressing. Wound #2 Left Gluteus: Clean wound with Normal Saline. May Shower, gently pat wound dry prior to applying new dressing. Anesthetic: Wound #1 Midline Coccyx: Topical Lidocaine 4% cream applied to wound bed prior to debridement - in clinic Wound #2 Left Gluteus: Topical Lidocaine 4% cream applied to wound bed prior to debridement - in clinic Skin Barriers/Peri-Wound Care: Wound #1 Midline Coccyx: Skin Prep Wound #2 Left Gluteus: Skin Prep Primary Wound Dressing: Wound #1 Midline Coccyx: Tina MainlandSHAW, Maly W. ([id number]) Aquacel Ag Wound #2 Left Gluteus: Aquacel Ag Secondary Dressing: Wound #1 Midline Coccyx: Dry Gauze Boardered Foam Dressing - silicone bordered dressing due to skin breakdown Wound #2 Left Gluteus: Dry Gauze Boardered Foam Dressing - silicone bordered dressing due to skin  breakdown Dressing Change Frequency: Wound #1 Midline Coccyx: Change dressing every day. Wound #2 Left Gluteus: Change dressing every day. Follow-up Appointments: Wound #1 Midline Coccyx: Return Appointment in 1 week. Wound #2 Left Gluteus: Return Appointment in 1 week. Off-Loading: Wound #1 Midline Coccyx: Roho cushion for wheelchair - [facility] please order roho cushion or gel cushion for patients wheelchair - whichever she qualifies for Turn and reposition every 2 hours Wound #2 Left Gluteus:  Roho cushion for wheelchair - [facility] please order roho cushion or gel cushion for patients wheelchair - whichever she qualifies for Turn and reposition every 2 hours Additional Orders / Instructions: Wound #1 Midline Coccyx: Increase protein intake. - please add protein supplements to patients diet Other: - please add vitamin A, vitamin C and zinc supplements to patients diet Wound #2 Left Gluteus: Increase protein intake. - please add protein supplements to patients diet Other: - please add vitamin A, vitamin C and zinc supplements to patients diet Medications-please add to medication list.: Wound #1 Midline Coccyx: P.O. Antibiotics Other: - lotrisone cream, oral antifungal Wound #2 Left Gluteus: P.O. Antibiotics Other: - lotrisone cream, oral antifungal Radiology ordered were: X-ray, coccyx - and sacrum ALANTIS, BETHUNE ([id number]) #1 extremely frail elderly woman who actually systemically looks more frail than when the first time we saw her #2 draining wound certainly does not look to be in the healing state #3 the patient had a confluent and of erythema going to the left from her wound. This was nontender. I wondered whether this was Tennille versus coexistent cellulitis. I treated her for both last week with some improvement #4 the wound certainly is not progressing although it was improving up until about 2-3 weeks ago. Given her frail state I've been somewhat reticent towards  imaging studies other aggressive investigations however a second daughter who is in the room seems to be much the same as the first they seem to want an aggressive approach to this. Therefore I've ordered an plain x-ray of this but may require an MRI. She needs lab work including a comprehensive metabolic panel CBC with differential sedimentation rate and C-reactive protein Electronic Signature(s) Signed: 03/24/2017 4:49:17 PM By: Baltazar Najjar MD Entered By: Baltazar Najjar on 03/24/2017 14:00:28 [name] ([id number]) -------------------------------------------------------------------------------- SuperBill Details Patient Name: [name] Date of Service: 03/24/2017 Medical Record Patient Account Number: [id number] Number: Treating RN: Curtis Sites 01/25/1923 (83 y.o. Other Clinician: Date of Birth/Sex: Female) Treating Jaymond Waage Primary Care Provider: Vonita Moss Provider/Extender: G Referring Provider: Vonita Moss Weeks in Treatment: 10 Diagnosis Coding ICD-10 Codes Code Description L89.150 Pressure ulcer of sacral region, unstageable G20 Parkinson's disease Facility Procedures CPT4 Code: [id number] Description: 99213 - WOUND CARE VISIT-LEV 3 EST PT Modifier: Quantity: 1 Physician Procedures CPT4 Code: [id number] Description: 99213 - WC PHYS LEVEL 3 - EST PT ICD-10 Description Diagnosis L89.150 Pressure ulcer of sacral region, unstageable Modifier: Quantity: 1 Electronic Signature(s) Signed: 03/24/2017 2:39:40 PM By: Curtis Sites Signed: 03/24/2017 4:49:17 PM By: Baltazar Najjar MD Entered By: Curtis Sites on 03/24/2017 14:39:39

## 2017-03-26 ENCOUNTER — Other Ambulatory Visit
Admission: RE | Admit: 2017-03-26 | Discharge: 2017-03-26 | Disposition: A | Discharge status: Home / Self Care | Payer: Medicare Other | Source: Ambulatory Visit | Attending: Internal Medicine | Admitting: Internal Medicine

## 2017-03-26 ENCOUNTER — Other Ambulatory Visit: Payer: Self-pay | Admitting: Internal Medicine

## 2017-03-26 ENCOUNTER — Ambulatory Visit
Admission: RE | Admit: 2017-03-26 | Discharge: 2017-03-26 | Disposition: A | Discharge status: Home / Self Care | Payer: Medicare Other | Source: Ambulatory Visit | Attending: Internal Medicine | Admitting: Internal Medicine

## 2017-03-26 DIAGNOSIS — G20 Parkinson's disease: Secondary | ICD-10-CM | POA: Diagnosis present

## 2017-03-26 DIAGNOSIS — B999 Unspecified infectious disease: Secondary | ICD-10-CM | POA: Insufficient documentation

## 2017-03-26 LAB — COMPREHENSIVE METABOLIC PANEL
ALT: 12 U/L — ABNORMAL LOW (ref 14–54)
AST: 28 U/L (ref 15–41)
Albumin: 3.8 g/dL (ref 3.5–5.0)
Alkaline Phosphatase: 67 U/L (ref 38–126)
Anion gap: 6 (ref 5–15)
BUN: 18 mg/dL (ref 6–20)
CO2: 27 mmol/L (ref 22–32)
Calcium: 9.8 mg/dL (ref 8.9–10.3)
Chloride: 105 mmol/L (ref 101–111)
Creatinine, Ser: 0.54 mg/dL (ref 0.44–1.00)
GFR calc Af Amer: >60 mL/min (ref >60)
GFR calc non Af Amer: >60 mL/min (ref >60)
Glucose, Bld: 125 mg/dL — ABNORMAL HIGH (ref 65–99)
Potassium: 4.6 mmol/L (ref 3.5–5.1)
Sodium: 138 mmol/L (ref 135–145)
Total Bilirubin: 0.5 mg/dL (ref 0.3–1.2)
Total Protein: 6.7 g/dL (ref 6.5–8.1)

## 2017-03-26 LAB — CBC WITH DIFFERENTIAL/PLATELET
Basophils Absolute: 0.1 10*3/uL (ref 0–0.1)
Basophils Relative: 1 %
Eosinophils Absolute: 0.1 10*3/uL (ref 0–0.7)
Eosinophils Relative: 1 %
HCT: 35.7 % (ref 35.0–47.0)
Hemoglobin: 12.3 g/dL (ref 12.0–16.0)
Lymphocytes Relative: 22 %
Lymphs Abs: 2.1 10*3/uL (ref 1.0–3.6)
MCH: 31.5 pg (ref 26.0–34.0)
MCHC: 34.6 g/dL (ref 32.0–36.0)
MCV: 91.3 fL (ref 80.0–100.0)
Monocytes Absolute: 0.8 10*3/uL (ref 0.2–0.9)
Monocytes Relative: 9 %
Neutro Abs: 6.5 10*3/uL (ref 1.4–6.5)
Neutrophils Relative %: 67 %
Platelets: 358 10*3/uL (ref 150–440)
RBC: 3.91 MIL/uL (ref 3.80–5.20)
RDW: 13 % (ref 11.5–14.5)
WBC: 9.5 10*3/uL (ref 3.6–11.0)

## 2017-03-26 LAB — C-REACTIVE PROTEIN: CRP: <0.8 mg/dL (ref <1.0)

## 2017-03-26 LAB — SEDIMENTATION RATE: Sed Rate: 17 mm/hr (ref 0–30)

## 2017-03-31 ENCOUNTER — Encounter: Payer: Medicare Other | Attending: Internal Medicine | Admitting: Internal Medicine

## 2017-03-31 DIAGNOSIS — L89150 Pressure ulcer of sacral region, unstageable: Secondary | ICD-10-CM | POA: Insufficient documentation

## 2017-03-31 DIAGNOSIS — G20 Parkinson's disease: Secondary | ICD-10-CM | POA: Diagnosis not present

## 2017-03-31 DIAGNOSIS — F0390 Unspecified dementia without behavioral disturbance: Secondary | ICD-10-CM | POA: Diagnosis not present

## 2017-04-01 NOTE — Progress Notes (Signed)
[name], [name] ([id number]) Visit Report for 03/31/2017 Chief Complaint Document Details Patient Name: [name], [name] Date of Service: 03/31/2017 8:00 AM Medical Record Patient Account Number: [id number] Number: Treating RN: Huel CoventryWoody, Kim 05/07/1923 (83 y.o. Other Clinician: Date of Birth/Sex: Female) Treating ROBSON, MICHAEL Primary Care Provider: Vonita MossRISSMAN, MARK Provider/Extender: G Referring Provider: Vonita MossRISSMAN, MARK Weeks in Treatment: 11 Information Obtained from: Patient Chief Complaint 01/07/17; patient is here for review of a sacral pressure ulcer Electronic Signature(s) Signed: 03/31/2017 5:35:48 PM By: Baltazar Najjarobson, Michael MD Entered By: Baltazar Najjarobson, Michael on 03/31/2017 09:27:11 [name], [name] ([id number]) -------------------------------------------------------------------------------- Debridement Details Patient Name: [name], [name] Date of Service: 03/31/2017 8:00 AM Medical Record Patient Account Number: [id number] Number: Treating RN: Huel CoventryWoody, Kim 05/07/1923 (83 y.o. Other Clinician: Date of Birth/Sex: Female) Treating ROBSON, MICHAEL Primary Care Provider: Vonita MossRISSMAN, MARK Provider/Extender: G Referring Provider: Vonita MossRISSMAN, MARK Weeks in Treatment: 11 Debridement Performed for Wound #1 Midline Coccyx Assessment: Performed By: Physician Maxwell CaulOBSON, MICHAEL G, MD Debridement: Debridement Pre-procedure Verification/Time Out Yes - 09:01 Taken: Start Time: 09:01 Pain Control: Lidocaine 4% Topical Solution Level: Skin/Subcutaneous Tissue Total Area Debrided (L x 4.2 (cm) x 2.8 (cm) = 11.76 (cm) W): Tissue and other Viable, Non-Viable, Eschar, Fibrin/Slough, Subcutaneous material debrided: Instrument: Curette Bleeding: Moderate Hemostasis Achieved: Pressure End Time: 09:04 Procedural Pain: 0 Post Procedural Pain: 0 Response to Treatment: Procedure was tolerated well Post Debridement Measurements of Total Wound Length: (cm) 4.2 Stage: Category/Stage  III Width: (cm) 2.8 Depth: (cm) 1.2 Volume: (cm) 11.084 Character of Wound/Ulcer Post Improved Debridement: Post Procedure Diagnosis Same as Pre-procedure Electronic Signature(s) Signed: 03/31/2017 3:55:06 PM By: Elliot GurneyWoody, BSN, RN, CWS, Kim RN, BSN Signed: 03/31/2017 5:35:48 PM By: Baltazar Najjarobson, Michael MD Entered By: Baltazar Najjarobson, Michael on 03/31/2017 09:26:29 [name], [name] ([id number]) [name], [name] ([id number]) -------------------------------------------------------------------------------- HPI Details Patient Name: [name], [name] Date of Service: 03/31/2017 8:00 AM Medical Record Patient Account Number: [id number] Number: Treating RN: Huel CoventryWoody, Kim 05/07/1923 (83 y.o. Other Clinician: Date of Birth/Sex: Female) Treating ROBSON, MICHAEL Primary Care Provider: Vonita MossRISSMAN, MARK Provider/Extender: G Referring Provider: Vonita MossRISSMAN, MARK Weeks in Treatment: 11 History of Present Illness HPI Description: 01/07/17 this is a 83 year old woman admitted to the clinic today for review of a pressure ulcer on her lower sacrum. She is referred from her primary physician's office after being seen on 3/22 with a 3 cm pressure area. Her daughter and caretaker accompanied her today state that the area first became obvious about a month ago and his since deteriorated. They have recently got [name] a home health involved and have been using [name] to the wound. They have ordered a pressure relief surface for her mattress. They are turning her religiously. They state that she eats well and they've been forcing fluids on her. She is on a multivitamin. [facility] last albumin I see was 4.4 on 10/28. The patient has advanced parkinsonism which looks superficially like advanced Parkinson's disease although her daughter tells me she did not ever respond to Sinemet therefore this may have another pathology with signs of parkinsonism. However I think this is largely a mute point  currently. She also has dementia and is nonambulatory. Since this started they have been keeping her in bed and turning her religiously every 2 hours. [place] at [place] in [place] with her husband with 24/7 care giving 01/13/17 santyl change qd. Still will require further debridement. continue santyl. 01/20/17; patient's wound actually looks some better less adherent necrotic surface. There is actually visible  granulation. We're using [name] 01/27/17; better-looking surface but still a lot of necrotic tissue on the base of this wound. The periwound erythema is better than last week we are still using [name]. Her daughter tells [place] that she is still having trouble with the pressure-relief mattress through medical modalities 02/03/18; I had the patient scheduled for a two week followup however her daughter brought her in early concerned for discoloration on 2 areas of the wound circumference. We have bee using santyl 02/10/17;Better looking surface to the wound. Rim appears better suggesting better offloading. Using santyl 02/24/17; change to Silver Collegen last time. Wound appears better. 03/10/17; still using silver collagen religious offloading. Intake is satisfactory per her daughter. Dimension slightly better 03/12/2017 -- Dr. Jannetta Quint patient who had been seen 2 days ago and was doing fairly well. The patient is brought in by her daughter who noticed a new wound just above the previous wound on her sacral area and going on more to the left lateral side. She was very concerned and we asked her to get in for an opinion. 03/17/17; above is noted. The patient has developed a progressive area to the left of her original wound. This seems to this started with a ring of red skin with a more pale interior almost looking fungal. There was a rim of blister through part of the area although this did not look like zoster. They have been applying triamcinolone that was prescribed last week by Dr. Meyer Russel and the  area has a fold to a linear band area which is confluent, erythematous and with obvious epidermal swelling but there is no overt tenderness or crepitus. She has lost some surface epithelium closer to the wound surface itself and now has a more superficial wound in this area and the extending erythema goes towards the left buttock. This is well demarcated between involved in normal skin but once again does not appear to be at all tender. If there is a contact AIRYN, ELLZEY. ([id number]) issue here I cannot get the history out of the daughter or the caregiver that are with her. 03/24/17; the patient arrives today with the wound slightly worse slightly more drainage. The bandlike area of erythema that I treated as a possible pineal infection has improved somewhat although proximally is still has confluent erythema without overt tenderness. We have been using silver alginate since the most recent deterioration. To the bandlike degree of erythema we have been using Lotrisone cream 03/31/17; patient arrives today with the wound slightly larger, necrotic surface and surrounding erythema. The bandlike area of erythema that I treated as a possible pineal infection is less swollen but still present I've been using Lotrisone cream on that largely related to the presence of a tinea looking infection when this was first seen. We've been using silver alginate. X-ray that I ordered last week showed no acute bony abnormalities mild fecal impaction. Lab work showed a comprehensive metabolic panel that was normal including an albumin of 3.8. White count was 9.5 hemoglobin 12.3 differential count normal. C-reactive protein was less than 1 and sedimentation rate and 17. The latter 2 values does not support an ongoing bacterial infection. Electronic Signature(s) Signed: 03/31/2017 5:35:48 PM By: Baltazar Najjar MD Entered By: Baltazar Najjar on 03/31/2017 09:29:58 [name]  ([id number]) -------------------------------------------------------------------------------- Physical Exam Details Patient Name: [name] Date of Service: 03/31/2017 8:00 AM Medical Record Patient Account Number: [id number] Number: Treating RN: Huel Coventry 24-Sep-1923 (83 y.o. Other Clinician: Date of Birth/Sex: Female)  Treating Baltazar Najjar Primary Care Provider: Vonita Moss Provider/Extender: G Referring Provider: Vonita Moss Weeks in Treatment: 11 Constitutional Patient is hypertensive. I did not see the diastolic blood pressure until she left the clinic. Pulse regular and within target range for patient.Marland Kitchen Respirations regular, non-labored and within target range.. Temperature is normal and within the target range for the patient.. Usual very frail-looking woman with advanced dementia. Notes Wound exam; the original wound has deteriorated in terms of surface with a necrotic surface over 100% of the wound. There is surrounding erythema but no overt tenderness. The bandlike issue going into the left gluteal fold is still present but less swollen and less angry looking. There is no overt tenderness over this area as well. oUsing a #5 curet the area of necrotic material over the surface of the wound was aggressively debridement with some difficulty. Hemostasis with direct pressure. The patient tolerated this reasonably well Electronic Signature(s) Signed: 03/31/2017 5:35:48 PM By: Baltazar Najjar MD Entered By: Baltazar Najjar on 03/31/2017 09:31:45 [name] ([id number]) -------------------------------------------------------------------------------- Physician Orders Details Patient Name: [name] Date of Service: 03/31/2017 8:00 AM Medical Record Patient Account Number: [id number] Number: Treating RN: Curtis Sites 06/18/1923 (83 y.o. Other Clinician: Date of Birth/Sex: Female) Treating ROBSON, MICHAEL Primary Care Provider:  Vonita Moss Provider/Extender: G Referring Provider: Vonita Moss Weeks in Treatment: 11 Verbal / Phone Orders: No Diagnosis Coding Wound Cleansing Wound #1 Midline Coccyx o Clean wound with Normal Saline. o May Shower, gently pat wound dry prior to applying new dressing. Anesthetic Wound #1 Midline Coccyx o Topical Lidocaine 4% cream applied to wound bed prior to debridement - in clinic Skin Barriers/Peri-Wound Care Wound #1 Midline Coccyx o Skin Prep Primary Wound Dressing Wound #1 Midline Coccyx o [name] Ointment Secondary Dressing Wound #1 Midline Coccyx o Dry Gauze o Boardered Foam Dressing - silicone bordered dressing due to skin breakdown Dressing Change Frequency Wound #1 Midline Coccyx o Change dressing every day. Follow-up Appointments Wound #1 Midline Coccyx o Return Appointment in 1 week. Off-Loading Wound #1 Midline Coccyx o Roho cushion for wheelchair [name], LIGHTSEY. ([id number]) o Turn and reposition every 2 hours Additional Orders / Instructions Wound #1 Midline Coccyx o Increase protein intake. - please add protein supplements to patients diet o Other: - please add vitamin A, vitamin C and zinc supplements to patients diet Medications-please add to medication list. Wound #1 Midline Coccyx o Other: - lotrisone cream, oral antifungal Patient Medications Allergies: Phenergan, Sulfa (Sulfonamide Antibiotics) Notifications Medication Indication Start End doxycycline monohydrate 03/31/2017 DOSE oral 100 mg capsule - 1 bid capsule oral [name] DOSE topical 250 unit/gram ointment - ointment topical Electronic Signature(s) Signed: 03/31/2017 9:14:53 AM By: Baltazar Najjar MD Previous Signature: 03/31/2017 9:12:03 AM Version By: Baltazar Najjar MD Entered By: Baltazar Najjar on 03/31/2017 09:14:53 [name] ([id number]) -------------------------------------------------------------------------------- Problem List  Details Patient Name: [name] Date of Service: 03/31/2017 8:00 AM Medical Record Patient Account Number: [id number] Number: Treating RN: Huel Coventry 06-Oct-1922 (83 y.o. Other Clinician: Date of Birth/Sex: Female) Treating ROBSON, MICHAEL Primary Care Provider: Vonita Moss Provider/Extender: G Referring Provider: Vonita Moss Weeks in Treatment: 11 Active Problems ICD-10 Encounter Code Description Active Date Diagnosis L89.150 Pressure ulcer of sacral region, unstageable 01/07/2017 Yes G20 Parkinson's disease 01/07/2017 Yes Inactive Problems Resolved Problems Electronic Signature(s) Signed: 03/31/2017 5:35:48 PM By: Baltazar Najjar MD Entered By: Baltazar Najjar on 03/31/2017 09:26:04 [name] ([id number]) -------------------------------------------------------------------------------- Progress Note Details Patient Name: [name] Date of  Service: 03/31/2017 8:00 AM Medical Record Patient Account Number: [id number] Number: Treating RN: Huel Coventry 1923/07/10 (83 y.o. Other Clinician: Date of Birth/Sex: Female) Treating ROBSON, MICHAEL Primary Care Provider: Vonita Moss Provider/Extender: G Referring Provider: Vonita Moss Weeks in Treatment: 11 Subjective Chief Complaint Information obtained from Patient 01/07/17; patient is here for review of a sacral pressure ulcer History of Present Illness (HPI) 01/07/17 this is a 83 year old woman admitted to the clinic today for review of a pressure ulcer on her lower sacrum. She is referred from her primary physician's office after being seen on 3/22 with a 3 cm pressure area. Her daughter and caretaker accompanied her today state that the area first became obvious about a month ago and his since deteriorated. They have recently got [name] a home health involved and have been using [name] to the wound. They have ordered a pressure relief surface for her mattress. They are turning  her religiously. They state that she eats well and they've been forcing fluids on her. She is on a multivitamin. Looking through [facility] point last albumin I see was 4.4 on 10/28. The patient has advanced parkinsonism which looks superficially like advanced Parkinson's disease although her daughter tells me she did not ever respond to Sinemet therefore this may have another pathology with signs of parkinsonism. However I think this is largely a mute point currently. She also has dementia and is nonambulatory. Since this started they have been keeping her in bed and turning her religiously every 2 hours. She lives at home in [place] with her husband with 24/7 care giving 01/13/17 santyl change qd. Still will require further debridement. continue santyl. 01/20/17; patient's wound actually looks some better less adherent necrotic surface. There is actually visible granulation. We're using [name] 01/27/17; better-looking surface but still a lot of necrotic tissue on the base of this wound. The periwound erythema is better than last week we are still using [name]. Her daughter tells [place] that she is still having trouble with the pressure-relief mattress through medical modalities 02/03/18; I had the patient scheduled for a two week followup however her daughter brought her in early concerned for discoloration on 2 areas of the wound circumference. We have bee using santyl 02/10/17;Better looking surface to the wound. Rim appears better suggesting better offloading. Using santyl 02/24/17; change to Silver Collegen last time. Wound appears better. 03/10/17; still using silver collagen religious offloading. Intake is satisfactory per her daughter. Dimension slightly better 03/12/2017 -- Dr. Jannetta Quint patient who had been seen 2 days ago and was doing fairly well. The patient is brought in by her daughter who noticed a new wound just above the previous wound on her sacral area and going on more to the left  lateral side. She was very concerned and we asked her to get in for an opinion. 03/17/17; above is noted. The patient has developed a progressive area to the left of her original wound. This seems to this started with a ring of red skin with a more pale interior almost looking fungal. There was a rim [name], NAEVE. ([id number]) of blister through part of the area although this did not look like zoster. They have been applying triamcinolone that was prescribed last week by Dr. Meyer Russel and the area has a fold to a linear band area which is confluent, erythematous and with obvious epidermal swelling but there is no overt tenderness or crepitus. She has lost some surface epithelium closer to the wound surface itself and now has  a more superficial wound in this area and the extending erythema goes towards the left buttock. This is well demarcated between involved in normal skin but once again does not appear to be at all tender. If there is a contact issue here I cannot get the history out of the daughter or the caregiver that are with her. 03/24/17; the patient arrives today with the wound slightly worse slightly more drainage. The bandlike area of erythema that I treated as a possible pineal infection has improved somewhat although proximally is still has confluent erythema without overt tenderness. We have been using silver alginate since the most recent deterioration. To the bandlike degree of erythema we have been using Lotrisone cream 03/31/17; patient arrives today with the wound slightly larger, necrotic surface and surrounding erythema. The bandlike area of erythema that I treated as a possible pineal infection is less swollen but still present I've been using Lotrisone cream on that largely related to the presence of a tinea looking infection when this was first seen. We've been using silver alginate. X-ray that I ordered last week showed no acute bony abnormalities mild fecal impaction. Lab  work showed a comprehensive metabolic panel that was normal including an albumin of 3.8. White count was 9.5 hemoglobin 12.3 differential count normal. C-reactive protein was less than 1 and sedimentation rate and 17. The latter 2 values does not support an ongoing bacterial infection. Objective Constitutional Patient is hypertensive. I did not see the diastolic blood pressure until she left the clinic. Pulse regular and within target range for patient.Marland Kitchen Respirations regular, non-labored and within target range.. Temperature is normal and within the target range for the patient.. Usual very frail-looking woman with advanced dementia. Vitals Time Taken: 8:41 AM, Temperature: 98.3 F, Pulse: 79 bpm, Respiratory Rate: 16 breaths/min, Blood Pressure: 142/111 mmHg. General Notes: Wound exam; the original wound has deteriorated in terms of surface with a necrotic surface over 100% of the wound. There is surrounding erythema but no overt tenderness. The bandlike issue going into the left gluteal fold is still present but less swollen and less angry looking. There is no overt tenderness over this area as well. Using a #5 curet the area of necrotic material over the surface of the wound was aggressively debridement with some difficulty. Hemostasis with direct pressure. The patient tolerated this reasonably well Integumentary (Hair, Skin) Wound #1 status is Open. Original cause of wound was Pressure Injury. The wound is located on the [place]. ([id number]) Midline Coccyx. The wound measures 4.2cm length x 2.8cm width x 1cm depth; 9.236cm^2 area and 9.236cm^3 volume. There is Fat Layer (Subcutaneous Tissue) Exposed exposed. There is no tunneling or undermining noted. There is a large amount of serous drainage noted. The wound margin is flat and intact. There is no granulation within the wound bed. There is a large (67-100%) amount of necrotic tissue within the wound bed including Eschar and  Adherent Slough. The periwound skin appearance exhibited: Induration, Erythema. The periwound skin appearance did not exhibit: Callus, Crepitus, Excoriation, Rash, Scarring, Dry/Scaly, Maceration, Atrophie Blanche, Cyanosis, Ecchymosis, Hemosiderin Staining, Mottled, Pallor, Rubor. The surrounding wound skin color is noted with erythema which is circumferential. Periwound temperature was noted as No Abnormality. The periwound has tenderness on palpation. Wound #2 status is Healed - Epithelialized. Original cause of wound was Pressure Injury. The wound is located on the Left Gluteus. The wound measures 0cm length x 0cm width x 0cm depth; 0cm^2 area and 0cm^3 volume. Assessment Active Problems ICD-10  L89.150 - Pressure ulcer of sacral region, unstageable G20 - Parkinson's disease Procedures Wound #1 Pre-procedure diagnosis of Wound #1 is a Pressure Ulcer located on the Midline Coccyx . There was a Skin/Subcutaneous Tissue Debridement ([id number]) debridement with total area of 11.76 sq cm performed by Maxwell Caul, MD. with the following instrument(s): Curette to remove Viable and Non-Viable tissue/material including Fibrin/Slough, Eschar, and Subcutaneous after achieving pain control using Lidocaine 4% Topical Solution. A time out was conducted at 09:01, prior to the start of the procedure. A Moderate amount of bleeding was controlled with Pressure. The procedure was tolerated well with a pain level of 0 throughout and a pain level of 0 following the procedure. Post Debridement Measurements: 4.2cm length x 2.8cm width x 1.2cm depth; 11.084cm^3 volume. Post debridement Stage noted as Category/Stage III. Character of Wound/Ulcer Post Debridement is improved. Post procedure Diagnosis Wound #1: Same as Pre-Procedure [name], MKRTCHYAN. ([id number]) Plan Wound Cleansing: Wound #1 Midline Coccyx: Clean wound with Normal Saline. May Shower, gently pat wound dry prior to applying new  dressing. Anesthetic: Wound #1 Midline Coccyx: Topical Lidocaine 4% cream applied to wound bed prior to debridement - in clinic Skin Barriers/Peri-Wound Care: Wound #1 Midline Coccyx: Skin Prep Primary Wound Dressing: Wound #1 Midline Coccyx: [name] Ointment Secondary Dressing: Wound #1 Midline Coccyx: Dry Gauze Boardered Foam Dressing - silicone bordered dressing due to skin breakdown Dressing Change Frequency: Wound #1 Midline Coccyx: Change dressing every day. Follow-up Appointments: Wound #1 Midline Coccyx: Return Appointment in 1 week. Off-Loading: Wound #1 Midline Coccyx: Roho cushion for wheelchair Turn and reposition every 2 hours Additional Orders / Instructions: Wound #1 Midline Coccyx: Increase protein intake. - please add protein supplements to patients diet Other: - please add vitamin A, vitamin C and zinc supplements to patients diet Medications-please add to medication list.: Wound #1 Midline Coccyx: Other: - lotrisone cream, oral antifungal The following medication(s) was prescribed: doxycycline monohydrate oral 100 mg capsule 1 bid capsule oral starting 03/31/2017 [name] topical 250 unit/gram ointment ointment topical #1 again the possibility of cellulitis around the wound and I gave her 10 days of doxycycline escribe at 100 bid LYNDALL, BELLOT. ([id number]) #2 change back to [facility] as the primary dressing here, Iodoflex would've been an alternative although her daughter was concerned about possibly irritation from Iodoflex #3 the x-ray of her back did not show bony abnormality or soft tissue gas. Her lab work did not support an undiagnosed soft tissue infection. Nevertheless given the erythema around the wound edge I went ahead and gave her another 10 days of doxycycline #4 after a general improvement fairly rapidly when she first came here she is continued the largely get mildly worse every week. The bandlike area of erythema probably is tinea. She is not  really an easy candidate to consider an MRI Electronic Signature(s) Signed: 03/31/2017 5:35:48 PM By: Baltazar Najjar MD Entered By: Baltazar Najjar on 03/31/2017 09:34:06 [name] ([id number]) -------------------------------------------------------------------------------- SuperBill Details Patient Name: [name] Date of Service: 03/31/2017 Medical Record Patient Account Number: [id number] Number: Treating RN: Huel Coventry 1923-05-21 (83 y.o. Other Clinician: Date of Birth/Sex: Female) Treating ROBSON, MICHAEL Primary Care Provider: Vonita Moss Provider/Extender: G Referring Provider: Vonita Moss Weeks in Treatment: 11 Diagnosis Coding ICD-10 Codes Code Description L89.150 Pressure ulcer of sacral region, unstageable G20 Parkinson's disease Facility Procedures CPT4 Code: [id number] Description: 11042 - DEB SUBQ TISSUE 20 SQ CM/< ICD-10 Description Diagnosis L89.150 Pressure ulcer of sacral region, unstageable Modifier: Quantity: 1  Physician Procedures CPT4 Code: 1610960 Description: 11042 - WC PHYS SUBQ TISS 20 SQ CM ICD-10 Description Diagnosis L89.150 Pressure ulcer of sacral region, unstageable Modifier: Quantity: 1 Electronic Signature(s) Signed: 03/31/2017 5:35:48 PM By: Baltazar Najjar MD Entered By: Baltazar Najjar on 03/31/2017 09:34:35

## 2017-04-02 NOTE — Progress Notes (Signed)
MARIN, WISNER ([id number]) Visit Report for 03/31/2017 Arrival Information Details Patient Name: [name], BRAATEN Date of Service: 03/31/2017 8:00 AM Medical Record Patient Account Number: [id number] Number: Treating RN: Clover Mealy, RN, BSN, Rita [birth year] [phone] y.o. Other Clinician: Date of Birth/Sex: Female) Treating ROBSON, MICHAEL Primary Care Bobetta Korf: Vonita Moss Dorraine Ellender/Extender: G Referring Ethyn Schetter: Vonita Moss Weeks in Treatment: 11 Visit Information History Since Last Visit All ordered tests and consults were No Patient Arrived: Wheel completed: Chair Added or deleted any medications: No Arrival Time: 08:35 Any new allergies or adverse No Accompanied By: dtr/crgiver reactions: Transfer Assistance: Manual Had a fall or experienced change in No Patient Identification Verified: Yes activities of daily living that may Secondary Verification Process Yes affect Completed: risk of falls: Patient Requires Transmission-Based No Signs or symptoms of abuse/neglect No Precautions: since last visito Patient Has Alerts: No Hospitalized since last visit: No Has Dressing in Place as Yes Prescribed: Pain Present Now: Unable to Respond Electronic Signature(s) Signed: 03/31/2017 4:25:10 PM By: Elpidio Eric BSN, RN Entered By: Elpidio Eric on 03/31/2017 08:41:06 [name] ([id number]) -------------------------------------------------------------------------------- Encounter Discharge Information Details Patient Name: [name] Date of Service: 03/31/2017 8:00 AM Medical Record Patient Account Number: [id number] Number: Treating RN: Huel Coventry 1923-07-12 (83 y.o. Other Clinician: Date of Birth/Sex: Female) Treating ROBSON, MICHAEL Primary Care Lynetta Tomczak: Vonita Moss Tehilla Coffel/Extender: G Referring Makyah Lavigne: Vonita Moss Weeks in Treatment: 11 Encounter Discharge Information Items Discharge Pain Level: 0 Discharge Condition:  Stable Ambulatory Status: Wheelchair Discharge Destination: Home Transportation: Private Auto Accompanied By: dtr and cg Schedule Follow-up Appointment: Yes Medication Reconciliation completed No and provided to Patient/Care Lavel Rieman: Provided on Clinical Summary of Care: 03/31/2017 Form Type Recipient Paper Patient ES Electronic Signature(s) Signed: 03/31/2017 10:44:48 AM By: Curtis Sites Previous Signature: 03/31/2017 9:27:01 AM Version By: Gwenlyn Perking Entered By: Curtis Sites on 03/31/2017 10:44:48 [name] ([id number]) -------------------------------------------------------------------------------- Lower Extremity Assessment Details Patient Name: [name] Date of Service: 03/31/2017 8:00 AM Medical Record Patient Account Number: [id number] Number: Treating RN: Clover Mealy, RN, BSN, Rita 16-Mar-1923 (83 y.o. Other Clinician: Date of Birth/Sex: Female) Treating ROBSON, MICHAEL Primary Care Terriana Barreras: Vonita Moss Dadrian Ballantine/Extender: G Referring Braylee Bosher: Vonita Moss Weeks in Treatment: 11 Electronic Signature(s) Signed: 03/31/2017 4:25:10 PM By: Elpidio Eric BSN, RN Entered By: Elpidio Eric on 03/31/2017 08:41:48 [name] ([id number]) -------------------------------------------------------------------------------- Multi Wound Chart Details Patient Name: [name] Date of Service: 03/31/2017 8:00 AM Medical Record Patient Account Number: [id number] Number: Treating RN: Curtis Sites 1923-06-05 (83 y.o. Other Clinician: Date of Birth/Sex: Female) Treating ROBSON, MICHAEL Primary Care Elisabetta Mishra: Vonita Moss Judge Duque/Extender: G Referring Trysten Berti: Vonita Moss Weeks in Treatment: 11 Vital Signs Height(in): Pulse(bpm): 79 Weight(lbs): Blood Pressure 142/111 (mmHg): Body Mass Index(BMI): Temperature(F): 98.3 Respiratory Rate 16 (breaths/min): Photos: [1:No Photos] [2:No Photos] [N/A:N/A] Wound Location:  [1:Coccyx - Midline] [2:Left Gluteus] [N/A:N/A] Wounding Event: [1:Pressure Injury] [2:Pressure Injury] [N/A:N/A] Primary Etiology: [1:Pressure Ulcer] [2:Pressure Ulcer] [N/A:N/A] Comorbid History: [1:Anemia, Hypertension, Dementia] [2:N/A] [N/A:N/A] Date Acquired: [1:12/01/2016] [2:03/12/2017] [N/A:N/A] Weeks of Treatment: [1:11] [2:2] [N/A:N/A] Wound Status: [1:Open] [2:Healed - Epithelialized] [N/A:N/A] Measurements L x W x D 4.2x2.8x1 [2:0x0x0] [N/A:N/A] (cm) Area (cm) : [1:9.236] [2:0] [N/A:N/A] Volume (cm) : [1:9.236] [2:0] [N/A:N/A] % Reduction in Area: [1:-122.30%] [2:100.00%] [N/A:N/A] % Reduction in Volume: -455.70% [2:100.00%] [N/A:N/A] Classification: [1:Category/Stage III] [2:Category/Stage II] [N/A:N/A] Exudate Amount: [1:Large] [2:N/A] [N/A:N/A] Exudate Type: [1:Serous] [2:N/A] [N/A:N/A] Exudate Color: [1:amber] [2:N/A] [N/A:N/A] Wound Margin: [1:Flat and Intact] [2:N/A] [N/A:N/A] Granulation Amount: [1:None Present (0%)] [  2:N/A] [N/A:N/A] Necrotic Amount: [1:Large (67-100%)] [2:N/A] [N/A:N/A] Necrotic Tissue: [1:Eschar, Adherent Slough] [2:N/A] [N/A:N/A] Exposed Structures: [1:Fat Layer (Subcutaneous Tissue) Exposed: Yes Fascia: No Tendon: No Muscle: No] [2:N/A] [N/A:N/A] Joint: No Bone: No Epithelialization: None N/A N/A Debridement: Debridement ([id number](11042- N/A N/A 11047) Pre-procedure 09:01 N/A N/A Verification/Time Out Taken: Pain Control: Lidocaine 4% Topical N/A N/A Solution Tissue Debrided: Necrotic/Eschar, N/A N/A Fibrin/Slough, Subcutaneous Level: Skin/Subcutaneous N/A N/A Tissue Debridement Area (sq 11.76 N/A N/A cm): Instrument: Curette N/A N/A Bleeding: Moderate N/A N/A Hemostasis Achieved: Pressure N/A N/A Procedural Pain: 0 N/A N/A Post Procedural Pain: 0 N/A N/A Debridement Treatment Procedure was tolerated N/A N/A Response: well Post Debridement 4.2x2.8x1.2 N/A N/A Measurements L x W x D (cm) Post Debridement 11.084 N/A N/A Volume:  (cm) Post Debridement Category/Stage III N/A N/A Stage: Periwound Skin Texture: Induration: Yes No Abnormalities Noted N/A Excoriation: No Callus: No Crepitus: No Rash: No Scarring: No Periwound Skin Maceration: No No Abnormalities Noted N/A Moisture: Dry/Scaly: No Periwound Skin Color: Erythema: Yes No Abnormalities Noted N/A Atrophie Blanche: No Cyanosis: No Ecchymosis: No Hemosiderin Staining: No Mottled: No Pallor: No Rubor: No Erythema Location: Circumferential N/A N/A Temperature: No Abnormality N/A N/A Yes No N/A Tina MainlandSHAW, Jenniah W. ([id number]) Tenderness on Palpation: Wound Preparation: Ulcer Cleansing: N/A N/A Rinsed/Irrigated with Saline Topical Anesthetic Applied: Other: lidocaine 4% Procedures Performed: Debridement N/A N/A Treatment Notes Electronic Signature(s) Signed: 03/31/2017 5:35:48 PM By: Baltazar Najjarobson, Michael MD Entered By: Baltazar Najjarobson, Michael on 03/31/2017 09:26:14 Tina MainlandSHAW, Aaralyn W. ([id number]) -------------------------------------------------------------------------------- Multi-Disciplinary Care Plan Details Patient Name: Tina MainlandSHAW, Destynee W. Date of Service: 03/31/2017 8:00 AM Medical Record Patient Account Number: [id number] Number: Treating RN: Curtis SitesDorthy, Joanna 07/11/1923 (83 y.o. Other Clinician: Date of Birth/Sex: Female) Treating ROBSON, MICHAEL Primary Care Tayler Lassen: Vonita MossRISSMAN, MARK Latravia Southgate/Extender: G Referring Deavin Forst: Vonita MossRISSMAN, MARK Weeks in Treatment: 11 Active Inactive ` Abuse / Safety / Falls / Self Care Management Nursing Diagnoses: Impaired physical mobility Potential for falls Goals: Patient will remain injury free Date Initiated: 01/07/2017 Target Resolution Date: 04/03/2017 Goal Status: Active Interventions: Assess fall risk on admission and as needed Notes: ` Nutrition Nursing Diagnoses: Potential for alteratiion in Nutrition/Potential for imbalanced nutrition Goals: Patient/caregiver agrees to and verbalizes  understanding of need to use nutritional supplements and/or vitamins as prescribed Date Initiated: 01/07/2017 Target Resolution Date: 04/03/2017 Goal Status: Active Interventions: Assess patient nutrition upon admission and as needed per policy Notes: ` Orientation to the Wound Care Program Tina MainlandSHAW, Sadiyah W. ([id number]) Nursing Diagnoses: Knowledge deficit related to the wound healing center program Goals: Patient/caregiver will verbalize understanding of the Wound Healing Center Program Date Initiated: 01/07/2017 Target Resolution Date: 04/03/2017 Goal Status: Active Interventions: Provide education on orientation to the wound center Notes: ` Pressure Nursing Diagnoses: Knowledge deficit related to causes and risk factors for pressure ulcer development Goals: Patient will remain free from development of additional pressure ulcers Date Initiated: 01/07/2017 Target Resolution Date: 04/03/2017 Goal Status: Active Interventions: Assess potential for pressure ulcer upon admission and as needed Notes: ` Wound/Skin Impairment Nursing Diagnoses: Impaired tissue integrity Goals: Patient/caregiver will verbalize understanding of skin care regimen Date Initiated: 01/07/2017 Target Resolution Date: 04/03/2017 Goal Status: Active Ulcer/skin breakdown will have a volume reduction of 30% by week 4 Date Initiated: 01/07/2017 Target Resolution Date: 04/03/2017 Goal Status: Active Ulcer/skin breakdown will have a volume reduction of 50% by week 8 Date Initiated: 01/07/2017 Target Resolution Date: 04/03/2017 Goal Status: Active Ulcer/skin breakdown will have a volume reduction of 80% by week 12 Tina MainlandSHAW, Nanea W. ([id number]) Date  Initiated: 01/07/2017 Target Resolution Date: 04/03/2017 Goal Status: Active Ulcer/skin breakdown will heal within 14 weeks Date Initiated: 01/07/2017 Target Resolution Date: 04/03/2017 Goal Status: Active Interventions: Assess patient/caregiver ability to obtain necessary  supplies Assess patient/caregiver ability to perform ulcer/skin care regimen upon admission and as needed Assess ulceration(s) every visit Notes: Electronic Signature(s) Signed: 03/31/2017 4:53:36 PM By: Curtis Sites Entered By: Curtis Sites on 03/31/2017 09:01:27 [name] ([id number]) -------------------------------------------------------------------------------- Pain Assessment Details Patient Name: [name] Date of Service: 03/31/2017 8:00 AM Medical Record Patient Account Number: [id number] Number: Treating RN: Clover Mealy, RN, BSN, Rita 1923/06/13 (83 y.o. Other Clinician: Date of Birth/Sex: Female) Treating ROBSON, MICHAEL Primary Care Kimberli Winne: Vonita Moss Epic Tribbett/Extender: G Referring Rhenda [place]: Vonita Moss Weeks in Treatment: 11 Active Problems Location of Pain Severity and Description of Pain Patient Has Paino Patient Unable to Respond Site Locations Pain Management and Medication Current Pain Management: Electronic Signature(s) Signed: 03/31/2017 4:25:10 PM By: Elpidio Eric BSN, RN Entered By: Elpidio Eric on 03/31/2017 08:41:14 [name] ([id number]) -------------------------------------------------------------------------------- Patient/Caregiver Education Details Patient Name: [name] Date of Service: 03/31/2017 8:00 AM Medical Record Patient Account Number: [id number] Number: Treating RN: Curtis Sites 19-May-1923 (83 y.o. Other Clinician: Date of Birth/Gender: Female) Treating ROBSON, MICHAEL Primary Care Physician: Vonita Moss Physician/Extender: G Referring Physician: Veverly Fells in Treatment: 11 Education Assessment Education Provided To: Caregiver Education Topics Provided Wound/Skin Impairment: Handouts: Other: wound carea s ordered Methods: Demonstration, Explain/Verbal Responses: State content correctly Electronic Signature(s) Signed: 03/31/2017 4:53:36 PM By: Curtis Sites Entered By: Curtis Sites on 03/31/2017 10:45:07 [name] ([id number]) -------------------------------------------------------------------------------- Wound Assessment Details Patient Name: [name] Date of Service: 03/31/2017 8:00 AM Medical Record Patient Account Number: [id number] Number: Treating RN: Clover Mealy, RN, BSN, Rita 02/28/1923 (83 y.o. Other Clinician: Date of Birth/Sex: Female) Treating ROBSON, MICHAEL Primary Care Elnathan Fulford: Vonita Moss Ayron Fillinger/Extender: G Referring Mehdi Gironda: Vonita Moss Weeks in Treatment: 11 Wound Status Wound Number: 1 Primary Etiology: Pressure Ulcer Wound Location: Coccyx - Midline Wound Status: Open Wounding Event: Pressure Injury Comorbid History: Anemia, Hypertension, Dementia Date Acquired: 12/01/2016 Weeks Of Treatment: 11 Clustered Wound: No Photos Photo Uploaded By: Alejandro Mulling on 03/31/2017 10:52:57 Wound Measurements Length: (cm) 4.2 Width: (cm) 2.8 Depth: (cm) 1 Area: (cm) 9.236 Volume: (cm) 9.236 % Reduction in Area: -122.3% % Reduction in Volume: -455.7% Epithelialization: None Tunneling: No Undermining: No Wound Description Classification: Category/Stage III Wound Margin: Flat and Intact Exudate Amount: Large Exudate Type: Serous Exudate Color: amber Foul Odor After Cleansing: No Slough/Fibrino Yes Wound Bed Granulation Amount: None Present (0%) Exposed Structure Necrotic Amount: Large (67-100%) Fascia Exposed: No Necrotic Quality: Eschar, Adherent Slough Fat Layer (Subcutaneous Tissue) Exposed: Yes TONIMARIE, GRITZ ([id number]) Tendon Exposed: No Muscle Exposed: No Joint Exposed: No Bone Exposed: No Periwound Skin Texture Texture Color No Abnormalities Noted: No No Abnormalities Noted: No Callus: No Atrophie Blanche: No Crepitus: No Cyanosis: No Excoriation: No Ecchymosis: No Induration: Yes Erythema: Yes Rash: No Erythema Location:  Circumferential Scarring: No Hemosiderin Staining: No Mottled: No Moisture Pallor: No No Abnormalities Noted: No Rubor: No Dry / Scaly: No Maceration: No Temperature / Pain Temperature: No Abnormality Tenderness on Palpation: Yes Wound Preparation Ulcer Cleansing: Rinsed/Irrigated with Saline Topical Anesthetic Applied: Other: lidocaine 4%, Treatment Notes Wound #1 (Midline Coccyx) 1. Cleansed with: Clean wound with Normal Saline 2. Anesthetic Topical Lidocaine 4% cream to wound bed prior to debridement 4. Dressing Applied: [name] Ointment 5. Secondary Dressing Applied Bordered Foam Dressing Dry Gauze  Electronic Signature(s) Signed: 03/31/2017 4:25:10 PM By: Elpidio Eric BSN, RN Entered By: Elpidio Eric on 03/31/2017 08:51:25 [name] ([id number]) -------------------------------------------------------------------------------- Wound Assessment Details Patient Name: [name] Date of Service: 03/31/2017 8:00 AM Medical Record Patient Account Number: [id number] Number: Treating RN: Clover Mealy, RN, BSN, Rita 01-05-1923 [phone] y.o. Other Clinician: Date of Birth/Sex: Female) Treating ROBSON, MICHAEL Primary Care Cicily Bonano: Vonita Moss Zailah Zagami/Extender: G Referring Haidan Nhan: Vonita Moss Weeks in Treatment: 11 Wound Status Wound Number: 2 Primary Etiology: Pressure Ulcer Wound Location: Left Gluteus Wound Status: Healed - Epithelialized Wounding Event: Pressure Injury Date Acquired: 03/12/2017 Weeks Of Treatment: 2 Clustered Wound: No Photos Photo Uploaded By: Alejandro Mulling on 03/31/2017 10:52:58 Wound Measurements Length: (cm) 0 Width: (cm) 0 Depth: (cm) 0 Area: (cm) 0 Volume: (cm) 0 % Reduction in Area: 100% % Reduction in Volume: 100% Wound Description Classification: Category/Stage II Periwound Skin Texture Texture Color No Abnormalities Noted: No No Abnormalities Noted: No Moisture No Abnormalities Noted: No Electronic  Signature(s) MIOSOTIS, WETSEL ([id number]) Signed: 03/31/2017 4:25:10 PM By: Elpidio Eric BSN, RN Entered By: Elpidio Eric on 03/31/2017 09:00:35 [name] ([id number]) -------------------------------------------------------------------------------- Vitals Details Patient Name: [name] Date of Service: 03/31/2017 8:00 AM Medical Record Patient Account Number: [id number] Number: Treating RN: Clover Mealy, RN, BSN, Rita Aug 29, 1923 (83 y.o. Other Clinician: Date of Birth/Sex: Female) Treating ROBSON, MICHAEL Primary Care Danyia Borunda: Vonita Moss Cathaleen Korol/Extender: G Referring [place] Want: Vonita Moss Weeks in Treatment: 11 Vital Signs Time Taken: 08:41 Temperature (F): 98.3 Pulse (bpm): 79 Respiratory Rate (breaths/min): 16 Blood Pressure (mmHg): 142/111 Reference Range: 80 - 120 mg / dl Electronic Signature(s) Signed: 03/31/2017 4:25:10 PM By: Elpidio Eric BSN, RN Entered By: Elpidio Eric on 03/31/2017 08:41:39

## 2017-04-14 ENCOUNTER — Encounter: Payer: Medicare Other | Admitting: Internal Medicine

## 2017-04-14 DIAGNOSIS — L89150 Pressure ulcer of sacral region, unstageable: Secondary | ICD-10-CM | POA: Diagnosis not present

## 2017-04-15 NOTE — Progress Notes (Signed)
ZHOEY, BLACKSTOCK ([id number]) Visit Report for 04/14/2017 HPI Details Patient Name: [name], [name] Date of Service: 04/14/2017 10:15 AM Medical Record Patient Account Number: [id number] Number: Treating RN: Curtis Sites [birth year] (83 y.o. Other Clinician: Date of Birth/Sex: Female) Treating Curvin Hunger Primary Care Provider: Vonita Moss Provider/Extender: G Referring Provider: Vonita Moss Weeks in Treatment: 13 History of Present Illness HPI Description: 01/07/17 this is a 83 year old woman admitted to the clinic today for review of a pressure ulcer on her lower sacrum. She is referred from her primary physician's office after being seen on 3/22 with a 3 cm pressure area. Her daughter and caretaker accompanied her today state that the area first became obvious about a month ago and his since deteriorated. They have recently got [name] a home health involved and have been using [name] to the wound. They have ordered a pressure relief surface for her mattress. They are turning her religiously. They state that she eats well and they've been forcing fluids on her. She is on a multivitamin. Looking through [facility] point last albumin I see was 4.4 on 10/28. The patient has advanced parkinsonism which looks superficially like advanced Parkinson's disease although her daughter tells me she did not ever respond to Sinemet therefore this may have another pathology with signs of parkinsonism. However I think this is largely a mute point currently. She also has dementia and is nonambulatory. Since this started they have been keeping her in bed and turning her religiously every 2 hours. She lives at home in [place] with her husband with 24/7 care giving 01/13/17 santyl change qd. Still will require further debridement. continue santyl. 01/20/17; patient's wound actually looks some better less adherent necrotic surface. There is actually visible granulation. We're using  [name] 01/27/17; better-looking surface but still a lot of necrotic tissue on the base of this wound. The periwound erythema is better than last week we are still using [name]. Her daughter tells [place] that she is still having trouble with the pressure-relief mattress through medical modalities 02/03/18; I had the patient scheduled for a two week followup however her daughter brought her in early concerned for discoloration on 2 areas of the wound circumference. We have bee using santyl 02/10/17;Better looking surface to the wound. Rim appears better suggesting better offloading. Using santyl 02/24/17; change to Silver Collegen last time. Wound appears better. 03/10/17; still using silver collagen religious offloading. Intake is satisfactory per her daughter. Dimension slightly better 03/12/2017 -- Dr. Jannetta Quint patient who had been seen 2 days ago and was doing fairly well. The patient is brought in by her daughter who noticed a new wound just above the previous wound on her sacral area and going on more to the left lateral side. She was very concerned and we asked her to get in for an opinion. 03/17/17; above is noted. The patient has developed a progressive area to the left of her original wound. This seems to this started with a ring of red skin with a more pale interior almost looking fungal. There was a rim of blister through part of the area although this did not look like zoster. They have been applying triamcinolone that was prescribed last week by Dr. Meyer Russel and the area has a fold to a linear band area which is confluent, erythematous and with obvious epidermal swelling but there is no overt tenderness or crepitus. [name], [name] ([id number]) She has lost some surface epithelium closer to the wound surface itself and now has a  more superficial wound in this area and the extending erythema goes towards the left buttock. This is well demarcated between involved in normal skin but once again does  not appear to be at all tender. If there is a contact issue here I cannot get the history out of the daughter or the caregiver that are with her. 03/24/17; the patient arrives today with the wound slightly worse slightly more drainage. The bandlike area of erythema that I treated as a possible pineal infection has improved somewhat although proximally is still has confluent erythema without overt tenderness. We have been using silver alginate since the most recent deterioration. To the bandlike degree of erythema we have been using Lotrisone cream 03/31/17; patient arrives today with the wound slightly larger, necrotic surface and surrounding erythema. The bandlike area of erythema that I treated as a possible pineal infection is less swollen but still present I've been using Lotrisone cream on that largely related to the presence of a tinea looking infection when this was first seen. We've been using silver alginate. X-ray that I ordered last week showed no acute bony abnormalities mild fecal impaction. Lab work showed a comprehensive metabolic panel that was normal including an albumin of 3.8. White count was 9.5 hemoglobin 12.3 differential count normal. C-reactive protein was less than 1 and sedimentation rate and 17. The latter 2 values does not support an ongoing bacterial infection. 04/14/17; patient arrives after a 2 week hiatus. Her wound is not in good condition. Although the base of the wound looks stable she still has an erythematous area that was apparently blistered over the weekend. This again points to the left. As our intake nurse pointed out today this is in the area where the tissue folds together and we may need to prevent try to prevent this. Lab work and x-ray that I did to 3 weeks ago were unremarkable including her albumin nevertheless she is an extremely frail condition physically. We have have been using Engineer, civil (consulting)) Signed: 04/14/2017 5:59:51 PM By:  Baltazar Najjar MD Entered By: Baltazar Najjar on 04/14/2017 12:12:47 [name] ([id number]) -------------------------------------------------------------------------------- Physical Exam Details Patient Name: [name] Date of Service: 04/14/2017 10:15 AM Medical Record Patient Account Number: [id number] Number: Treating RN: Curtis Sites 1923/01/05 (83 y.o. Other Clinician: Date of Birth/Sex: Female) Treating Naseem Varden Primary Care Provider: Vonita Moss Provider/Extender: G Referring Provider: Vonita Moss Weeks in Treatment: 13 Constitutional Sitting or standing Blood Pressure is within target range for patient.. Pulse regular and within target range for patient.Marland Kitchen Respirations regular, non-labored and within target range.. Temperature is normal and within the target range for the patient.. Extremely frail physical condition. Respiratory Respiratory effort is easy and symmetric bilaterally. Rate is normal at rest and on room air.. Gastrointestinal (GI) . Psychiatric Advanced dementia unchanged. Notes Wound exam; wound has deteriorated with a larger wound area. There is erythema to the left of the wound at its its superior aspect. This looks like perhaps a friction injury from the folds of her skin. We will need to keep this apart. I switched the dressings to silver alginate to help to dry this off. She has a necrotic area superiorly on the circumflex circumferential wound but no overt infection Electronic Signature(s) Signed: 04/14/2017 5:59:51 PM By: Baltazar Najjar MD Entered By: Baltazar Najjar on 04/14/2017 12:17:28 [name] ([id number]) -------------------------------------------------------------------------------- Physician Orders Details Patient Name: [name] Date of Service: 04/14/2017 10:15 AM Medical Record Patient Account Number: [id number] Number: Treating  RN: Curtis Sites [birth year] (83 y.o. Other  Clinician: Date of Birth/Sex: Female) Treating Shanese Riemenschneider Primary Care Provider: Vonita Moss Provider/Extender: G Referring Provider: Vonita Moss Weeks in Treatment: 68 Verbal / Phone Orders: No Diagnosis Coding Wound Cleansing Wound #1 Midline Coccyx o Clean wound with Normal Saline. o May Shower, gently pat wound dry prior to applying new dressing. Anesthetic Wound #1 Midline Coccyx o Topical Lidocaine 4% cream applied to wound bed prior to debridement - in clinic Skin Barriers/Peri-Wound Care Wound #1 Midline Coccyx o Skin Prep Primary Wound Dressing Wound #1 Midline Coccyx o Aquacel Ag Secondary Dressing Wound #1 Midline Coccyx o Boardered Foam Dressing - silicone bordered dressing due to skin breakdown o Foam - cut a strip of foam and cover one side of the wound under the bordered foam Dressing Change Frequency Wound #1 Midline Coccyx o Change dressing every day. Follow-up Appointments Wound #1 Midline Coccyx o Return Appointment in 1 week. Off-Loading Wound #1 Midline Coccyx [name], [name]. ([id number]) o Roho cushion for wheelchair - [facility] please order roho cushion or gel cushion for patients wheelchair - whichever she qualifies for o Turn and reposition every 2 hours Additional Orders / Instructions Wound #1 Midline Coccyx o Increase protein intake. - please add protein supplements to patients diet o Other: - please add vitamin A, vitamin C and zinc supplements to patients diet Medications-please add to medication list. Wound #1 Midline Coccyx o Other: - lotrisone cream, oral antifungal Electronic Signature(s) Signed: 04/14/2017 5:46:25 PM By: Curtis Sites Signed: 04/14/2017 5:59:51 PM By: Baltazar Najjar MD Entered By: Curtis Sites on 04/14/2017 10:53:22 [name] ([id number]) -------------------------------------------------------------------------------- Problem List Details Patient Name: [name] Date of Service: 04/14/2017 10:15 AM Medical Record Patient Account Number: [id number] Number: Treating RN: Curtis Sites 1923/05/26 (83 y.o. Other Clinician: Date of Birth/Sex: Female) Treating Hajra Port Primary Care Provider: Vonita Moss Provider/Extender: G Referring Provider: Vonita Moss Weeks in Treatment: 13 Active Problems ICD-10 Encounter Code Description Active Date Diagnosis L89.150 Pressure ulcer of sacral region, unstageable 01/07/2017 Yes G20 Parkinson's disease 01/07/2017 Yes Inactive Problems Resolved Problems Electronic Signature(s) Signed: 04/14/2017 5:59:51 PM By: Baltazar Najjar MD Entered By: Baltazar Najjar on 04/14/2017 12:11:15 [name] ([id number]) -------------------------------------------------------------------------------- Progress Note Details Patient Name: [name] Date of Service: 04/14/2017 10:15 AM Medical Record Patient Account Number: [id number] Number: Treating RN: Curtis Sites 1922/11/16 (83 y.o. Other Clinician: Date of Birth/Sex: Female) Treating Samyak Sackmann Primary Care Provider: Vonita Moss Provider/Extender: G Referring Provider: Vonita Moss Weeks in Treatment: 13 Subjective History of Present Illness (HPI) 01/07/17 this is a 83 year old woman admitted to the clinic today for review of a pressure ulcer on her lower sacrum. She is referred from her primary physician's office after being seen on 3/22 with a 3 cm pressure area. Her daughter and caretaker accompanied her today state that the area first became obvious about a month ago and his since deteriorated. They have recently got [name] a home health involved and have been using [name] to the wound. They have ordered a pressure relief surface for her mattress. They are turning her religiously. They state that she eats well and they've been forcing fluids on her. She is on a multivitamin. Looking through [facility]  point last albumin I see was 4.4 on 10/28. The patient has advanced parkinsonism which looks superficially like advanced Parkinson's disease although her daughter tells me she did not ever respond to Sinemet therefore this may have another pathology with  signs of parkinsonism. However I think this is largely a mute point currently. She also has dementia and is nonambulatory. Since this started they have been keeping her in bed and turning her religiously every 2 hours. She lives at home in [place] with her husband with 24/7 care giving 01/13/17 santyl change qd. Still will require further debridement. continue santyl. 01/20/17; patient's wound actually looks some better less adherent necrotic surface. There is actually visible granulation. We're using [name] 01/27/17; better-looking surface but still a lot of necrotic tissue on the base of this wound. The periwound erythema is better than last week we are still using [name]. Her daughter tells [place] that she is still having trouble with the pressure-relief mattress through medical modalities 02/03/18; I had the patient scheduled for a two week followup however her daughter brought her in early concerned for discoloration on 2 areas of the wound circumference. We have bee using santyl 02/10/17;Better looking surface to the wound. Rim appears better suggesting better offloading. Using santyl 02/24/17; change to Silver Collegen last time. Wound appears better. 03/10/17; still using silver collagen religious offloading. Intake is satisfactory per her daughter. Dimension slightly better 03/12/2017 -- Dr. Jannetta Quint patient who had been seen 2 days ago and was doing fairly well. The patient is brought in by her daughter who noticed a new wound just above the previous wound on her sacral area and going on more to the left lateral side. She was very concerned and we asked her to get in for an opinion. 03/17/17; above is noted. The patient has developed a  progressive area to the left of her original wound. This seems to this started with a ring of red skin with a more pale interior almost looking fungal. There was a rim of blister through part of the area although this did not look like zoster. They have been applying triamcinolone that was prescribed last week by Dr. Meyer Russel and the area has a fold to a linear band area which is confluent, erythematous and with obvious epidermal swelling but there is no overt tenderness or crepitus. She has lost some surface epithelium closer to the wound surface itself and now has a more superficial wound in this area and the extending erythema goes towards the left buttock. This is well demarcated [name], [name]. ([id number]) between involved in normal skin but once again does not appear to be at all tender. If there is a contact issue here I cannot get the history out of the daughter or the caregiver that are with her. 03/24/17; the patient arrives today with the wound slightly worse slightly more drainage. The bandlike area of erythema that I treated as a possible pineal infection has improved somewhat although proximally is still has confluent erythema without overt tenderness. We have been using silver alginate since the most recent deterioration. To the bandlike degree of erythema we have been using Lotrisone cream 03/31/17; patient arrives today with the wound slightly larger, necrotic surface and surrounding erythema. The bandlike area of erythema that I treated as a possible pineal infection is less swollen but still present I've been using Lotrisone cream on that largely related to the presence of a tinea looking infection when this was first seen. We've been using silver alginate. X-ray that I ordered last week showed no acute bony abnormalities mild fecal impaction. Lab work showed a comprehensive metabolic panel that was normal including an albumin of 3.8. White count was 9.5 hemoglobin 12.3  differential count normal. C-reactive protein was  less than 1 and sedimentation rate and 17. The latter 2 values does not support an ongoing bacterial infection. 04/14/17; patient arrives after a 2 week hiatus. Her wound is not in good condition. Although the base of the wound looks stable she still has an erythematous area that was apparently blistered over the weekend. This again points to the left. As our intake nurse pointed out today this is in the area where the tissue folds together and we may need to prevent try to prevent this. Lab work and x-ray that I did to 3 weeks ago were unremarkable including her albumin nevertheless she is an extremely frail condition physically. We have have been using [name] Objective Constitutional Sitting or standing Blood Pressure is within target range for patient.. Pulse regular and within target range for patient.Marland Kitchen Respirations regular, non-labored and within target range.. Temperature is normal and within the target range for the patient.. Extremely frail physical condition. Vitals Time Taken: 10:25 AM, Temperature: 98.4 F, Pulse: 60 bpm, Respiratory Rate: 14 breaths/min, Blood Pressure: 132/83 mmHg. Respiratory Respiratory effort is easy and symmetric bilaterally. Rate is normal at rest and on room air.Marland Kitchen Psychiatric Advanced dementia unchanged. General Notes: Wound exam; wound has deteriorated with a larger wound area. There is erythema to the left of the wound at its its superior aspect. This looks like perhaps a friction injury from the folds of her skin. [name], [name] ([id number]) We will need to keep this apart. I switched the dressings to silver alginate to help to dry this off. She has a necrotic area superiorly on the circumflex circumferential wound but no overt infection Integumentary (Hair, Skin) Wound #1 status is Open. Original cause of wound was Pressure Injury. The wound is located on the Midline Coccyx. The wound measures 5cm  length x 4.5cm width x 1.3cm depth; 17.671cm^2 area and 22.973cm^3 volume. There is Fat Layer (Subcutaneous Tissue) Exposed exposed. There is no tunneling or undermining noted. There is a large amount of serous drainage noted. The wound margin is flat and intact. There is small (1-33%) pink granulation within the wound bed. There is a large (67-100%) amount of necrotic tissue within the wound bed including Eschar and Adherent Slough. The periwound skin appearance exhibited: Induration, Erythema. The periwound skin appearance did not exhibit: Callus, Crepitus, Excoriation, Rash, Scarring, Dry/Scaly, Maceration, Atrophie Blanche, Cyanosis, Ecchymosis, Hemosiderin Staining, Mottled, Pallor, Rubor. The surrounding wound skin color is noted with erythema which is circumferential. Periwound temperature was noted as No Abnormality. The periwound has tenderness on palpation. Assessment Active Problems ICD-10 L89.150 - Pressure ulcer of sacral region, unstageable G20 - Parkinson's disease Plan Wound Cleansing: Wound #1 Midline Coccyx: Clean wound with Normal Saline. May Shower, gently pat wound dry prior to applying new dressing. Anesthetic: Wound #1 Midline Coccyx: Topical Lidocaine 4% cream applied to wound bed prior to debridement - in clinic Skin Barriers/Peri-Wound Care: Wound #1 Midline Coccyx: Skin Prep Primary Wound Dressing: Wound #1 Midline Coccyx: Aquacel Ag Secondary Dressing: Wound #1 Midline Coccyx: Boardered Foam Dressing - silicone bordered dressing due to skin breakdown [name], [name]. ([id number]) Foam - cut a strip of foam and cover one side of the wound under the bordered foam Dressing Change Frequency: Wound #1 Midline Coccyx: Change dressing every day. Follow-up Appointments: Wound #1 Midline Coccyx: Return Appointment in 1 week. Off-Loading: Wound #1 Midline Coccyx: Roho cushion for wheelchair - [facility] please order roho cushion or gel cushion for patients  wheelchair - whichever she qualifies for Turn and reposition  every 2 hours Additional Orders / Instructions: Wound #1 Midline Coccyx: Increase protein intake. - please add protein supplements to patients diet Other: - please add vitamin A, vitamin C and zinc supplements to patients diet Medications-please add to medication list.: Wound #1 Midline Coccyx: Other: - lotrisone cream, oral antifungal #1 the patient would otherwise need an MRI, although the wound currently does not probe to bone it is deeper than it was certainly not nearly as healthy as at one point. #2 again she has the erythematous well demarcated finger shaped area pointing towards the left. This is not particularly tender there is no crepitus. #3 change the primary dressing to silver alginate. Careful attention to keeping the folds separated from each other . Electronic Signature(s) Signed: 04/14/2017 5:59:51 PM By: Baltazar Najjarobson, Leanard Dimaio MD Entered By: Baltazar Najjarobson, Canon Gola on 04/14/2017 12:18:30 [name], [name] ([id number]) -------------------------------------------------------------------------------- SuperBill Details Patient Name: [name], [name] Date of Service: 04/14/2017 Medical Record Patient Account Number: [id number] Number: Treating RN: Curtis SitesDorthy, Joanna May 31, 1923 (83 y.o. Other Clinician: Date of Birth/Sex: Female) Treating Jahzeel Poythress Primary Care Provider: Vonita MossRISSMAN, MARK Provider/Extender: G Referring Provider: Vonita MossRISSMAN, MARK Weeks in Treatment: 13 Diagnosis Coding ICD-10 Codes Code Description L89.150 Pressure ulcer of sacral region, unstageable G20 Parkinson's disease Facility Procedures CPT4 Code: [id number] Description: 99213 - WOUND CARE VISIT-LEV 3 EST PT Modifier: Quantity: 1 Physician Procedures CPT4 Code: [id number] Description: 99213 - WC PHYS LEVEL 3 - EST PT ICD-10 Description Diagnosis L89.150 Pressure ulcer of sacral region, unstageable G20 Parkinson's  disease Modifier: Quantity: 1 Electronic Signature(s) Signed: 04/14/2017 5:59:51 PM By: Baltazar Najjarobson, Mannix Kroeker MD Entered By: Baltazar Najjarobson, Darria Corvera on 04/14/2017 12:18:48

## 2017-04-15 NOTE — Progress Notes (Signed)
Tina MainlandSHAW, Maite W. ([id number]) Visit Report for 04/14/2017 Arrival Information Details Patient Name: Tina MainlandSHAW, [name] Date of Service: 04/14/2017 10:15 AM Medical Record Patient Account Number: [id number] Number: Treating RN: Curtis SitesDorthy, Joanna May 11, 1923 (83 y.o. Other Clinician: Date of Birth/Sex: Female) Treating ROBSON, MICHAEL Primary Care Eliora Nienhuis: Vonita MossRISSMAN, MARK Aranda Bihm/Extender: G Referring Braylynn Ghan: Vonita MossRISSMAN, MARK Weeks in Treatment: 13 Visit Information History Since Last Visit Added or deleted any medications: No Patient Arrived: Wheel Chair Any new allergies or adverse No reactions: Arrival Time: 10:25 Had a fall or experienced change in No Accompanied By: dtr and cg activities of daily living that may Transfer Assistance: Manual affect Patient Identification Verified: Yes risk of falls: Secondary Verification Process Yes Signs or symptoms of abuse/neglect No Completed: since last visito Patient Requires Transmission-Based No Hospitalized since last visit: No Precautions: Has Dressing in Place as Yes Patient Has Alerts: No Prescribed: Pain Present Now: Unable to Respond Electronic Signature(s) Signed: 04/14/2017 5:46:25 PM By: Curtis Sitesorthy, Joanna Entered By: Curtis Sitesorthy, Joanna on 04/14/2017 10:25:39 Tina MainlandSHAW, Randalyn W. ([id number]) -------------------------------------------------------------------------------- Clinic Level of Care Assessment Details Patient Name: Tina MainlandSHAW, [name] Date of Service: 04/14/2017 10:15 AM Medical Record Patient Account Number: [id number] Number: Treating RN: Curtis Sitesorthy, Joanna May 11, 1923 (83 y.o. Other Clinician: Date of Birth/Sex: Female) Treating ROBSON, MICHAEL Primary Care Ziare Cryder: Vonita MossRISSMAN, MARK Deliliah Spranger/Extender: G Referring Perrin Gens: Vonita MossRISSMAN, MARK Weeks in Treatment: 13 Clinic Level of Care Assessment Items TOOL 4 Quantity Score []  - Use when only an EandM is performed on FOLLOW-UP visit 0 ASSESSMENTS - Nursing  Assessment / Reassessment X - Reassessment of Co-morbidities (includes updates in patient status) 1 10 X - Reassessment of Adherence to Treatment Plan 1 5 ASSESSMENTS - Wound and Skin Assessment / Reassessment X - Simple Wound Assessment / Reassessment - one wound 1 5 []  - Complex Wound Assessment / Reassessment - multiple wounds 0 []  - Dermatologic / Skin Assessment (not related to wound area) 0 ASSESSMENTS - Focused Assessment []  - Circumferential Edema Measurements - multi extremities 0 []  - Nutritional Assessment / Counseling / Intervention 0 []  - Lower Extremity Assessment (monofilament, tuning fork, pulses) 0 []  - Peripheral Arterial Disease Assessment (using hand held doppler) 0 ASSESSMENTS - Ostomy and/or Continence Assessment and Care []  - Incontinence Assessment and Management 0 []  - Ostomy Care Assessment and Management (repouching, etc.) 0 PROCESS - Coordination of Care X - Simple Patient / Family Education for ongoing care 1 15 []  - Complex (extensive) Patient / Family Education for ongoing care 0 []  - Staff obtains ChiropractorConsents, Records, Test Results / Process Orders 0 []  - Staff telephones HHA, Nursing Homes / Clarify orders / etc 0 Tina MainlandSHAW, [name] ([id number]) []  - Routine Transfer to another Facility (non-emergent condition) 0 []  - Routine Hospital Admission (non-emergent condition) 0 []  - New Admissions / Manufacturing engineernsurance Authorizations / Ordering NPWT, Apligraf, etc. 0 []  - Emergency Hospital Admission (emergent condition) 0 X - Simple Discharge Coordination 1 10 []  - Complex (extensive) Discharge Coordination 0 PROCESS - Special Needs []  - Pediatric / Minor Patient Management 0 []  - Isolation Patient Management 0 []  - Hearing / Language / Visual special needs 0 []  - Assessment of Community assistance (transportation, D/C planning, etc.) 0 X - Additional assistance / Altered mentation 1 15 []  - Support Surface(s) Assessment (bed, cushion, seat, etc.) 0 INTERVENTIONS - Wound  Cleansing / Measurement X - Simple Wound Cleansing - one wound 1 5 []  - Complex Wound Cleansing - multiple wounds 0 X - Wound Imaging (photographs - any  number of wounds) 1 5 []  - Wound Tracing (instead of photographs) 0 X - Simple Wound Measurement - one wound 1 5 []  - Complex Wound Measurement - multiple wounds 0 INTERVENTIONS - Wound Dressings []  - Small Wound Dressing one or multiple wounds 0 X - Medium Wound Dressing one or multiple wounds 1 15 []  - Large Wound Dressing one or multiple wounds 0 []  - Application of Medications - topical 0 []  - Application of Medications - injection 0 BREEANN, REPOSA. ([id number]) INTERVENTIONS - Miscellaneous []  - External ear exam 0 []  - Specimen Collection (cultures, biopsies, blood, body fluids, etc.) 0 []  - Specimen(s) / Culture(s) sent or taken to Lab for analysis 0 []  - Patient Transfer (multiple staff / Michiel Sites Lift / Similar devices) 0 []  - Simple Staple / Suture removal (25 or less) 0 []  - Complex Staple / Suture removal (26 or more) 0 []  - Hypo / Hyperglycemic Management (close monitor of Blood Glucose) 0 []  - Ankle / Brachial Index (ABI) - do not check if billed separately 0 X - Vital Signs 1 5 Has the patient been seen at the hospital within the last three years: Yes Total Score: 95 Level Of Care: New/Established - Level 3 Electronic Signature(s) Signed: 04/14/2017 5:46:25 PM By: Curtis Sites Entered By: Curtis Sites on 04/14/2017 11:24:46 [name] ([id number]) -------------------------------------------------------------------------------- Encounter Discharge Information Details Patient Name: [name] Date of Service: 04/14/2017 10:15 AM Medical Record Patient Account Number: [id number] Number: Treating RN: Curtis Sites Feb 16, 1923 (83 y.o. Other Clinician: Date of Birth/Sex: Female) Treating ROBSON, MICHAEL Primary Care Tina Busser: Vonita Moss Kalyan Barabas/Extender: G Referring Eloisa Chokshi: Vonita Moss Weeks in Treatment: 13 Encounter Discharge Information Items Discharge Pain Level: 0 Discharge Condition: Stable Ambulatory Status: Wheelchair Discharge Destination: Home Transportation: Private Auto Accompanied By: dtr and cg Schedule Follow-up Appointment: Yes Medication Reconciliation completed and provided to Patient/Care No Daunte Oestreich: Provided on Clinical Summary of Care: 04/14/2017 Form Type Recipient Paper Patient ES Electronic Signature(s) Signed: 04/14/2017 11:09:45 AM By: Gwenlyn Perking Entered By: Gwenlyn Perking on 04/14/2017 11:09:45 [name] ([id number]) -------------------------------------------------------------------------------- Multi Wound Chart Details Patient Name: [name] Date of Service: 04/14/2017 10:15 AM Medical Record Patient Account Number: [id number] Number: Treating RN: Curtis Sites July 11, 1923 (83 y.o. Other Clinician: Date of Birth/Sex: Female) Treating ROBSON, MICHAEL Primary Care Lesieli Bresee: Vonita Moss Trevaris Pennella/Extender: G Referring Charletha Dalpe: Vonita Moss Weeks in Treatment: 13 Vital Signs Height(in): Pulse(bpm): 60 Weight(lbs): Blood Pressure 132/83 (mmHg): Body Mass Index(BMI): Temperature(F): 98.4 Respiratory Rate 14 (breaths/min): Photos: [N/A:N/A] Wound Location: Coccyx - Midline N/A N/A Wounding Event: Pressure Injury N/A N/A Primary Etiology: Pressure Ulcer N/A N/A Comorbid History: Anemia, Hypertension, N/A N/A Dementia Date Acquired: 12/01/2016 N/A N/A Weeks of Treatment: 13 N/A N/A Wound Status: Open N/A N/A Measurements L x W x D 5x4.5x1.3 N/A N/A (cm) Area (cm) : 17.671 N/A N/A Volume (cm) : 22.973 N/A N/A % Reduction in Area: -325.30% N/A N/A % Reduction in Volume: -1282.30% N/A N/A Classification: Category/Stage III N/A N/A Exudate Amount: Large N/A N/A Exudate Type: Serous N/A N/A Exudate Color: amber N/A N/A Wound Margin: Flat and Intact N/A N/A Granulation Amount:  Small (1-33%) N/A N/A Granulation Quality: Pink N/A N/A KONNOR, JORDEN ([id number]) Necrotic Amount: Large (67-100%) N/A N/A Necrotic Tissue: Eschar, Adherent Slough N/A N/A Exposed Structures: Fat Layer (Subcutaneous N/A N/A Tissue) Exposed: Yes Fascia: No Tendon: No Muscle: No Joint: No Bone: No Epithelialization: None N/A N/A Periwound Skin Texture: Induration: Yes N/A N/A  Excoriation: No Callus: No Crepitus: No Rash: No Scarring: No Periwound Skin Maceration: No N/A N/A Moisture: Dry/Scaly: No Periwound Skin Color: Erythema: Yes N/A N/A Atrophie Blanche: No Cyanosis: No Ecchymosis: No Hemosiderin Staining: No Mottled: No Pallor: No Rubor: No Erythema Location: Circumferential N/A N/A Temperature: No Abnormality N/A N/A Tenderness on Yes N/A N/A Palpation: Wound Preparation: Ulcer Cleansing: N/A N/A Rinsed/Irrigated with Saline Topical Anesthetic Applied: Other: lidocaine 4% Treatment Notes Wound #1 (Midline Coccyx) 1. Cleansed with: Clean wound with Normal Saline 2. Anesthetic Topical Lidocaine 4% cream to wound bed prior to debridement 4. Dressing Applied: Aquacel Ag 5. Secondary Dressing Applied Bordered Foam Dressing Foam YUMA, BLUCHER ([id number]) Electronic Signature(s) Signed: 04/14/2017 5:59:51 PM By: Baltazar Najjar MD Previous Signature: 04/14/2017 10:37:12 AM Version By: Curtis Sites Entered By: Baltazar Najjar on 04/14/2017 12:11:27 [name] ([id number]) -------------------------------------------------------------------------------- Multi-Disciplinary Care Plan Details Patient Name: [name] Date of Service: 04/14/2017 10:15 AM Medical Record Patient Account Number: [id number] Number: Treating RN: Curtis Sites September 29, 1923 (83 y.o. Other Clinician: Date of Birth/Sex: Female) Treating ROBSON, MICHAEL Primary Care Trayce Maino: Vonita Moss Allex Madia/Extender: G Referring  [place]: Vonita Moss Weeks in  Treatment: 13 Active Inactive ` Abuse / Safety / Falls / Self Care Management Nursing Diagnoses: Impaired physical mobility Potential for falls Goals: Patient will remain injury free Date Initiated: 01/07/2017 Target Resolution Date: 04/03/2017 Goal Status: Active Interventions: Assess fall risk on admission and as needed Notes: ` Nutrition Nursing Diagnoses: Potential for alteratiion in Nutrition/Potential for imbalanced nutrition Goals: Patient/caregiver agrees to and verbalizes understanding of need to use nutritional supplements and/or vitamins as prescribed Date Initiated: 01/07/2017 Target Resolution Date: 04/03/2017 Goal Status: Active Interventions: Assess patient nutrition upon admission and as needed per policy Notes: ` Orientation to the Wound Care Program TAUNA, MACFARLANE ([id number]) Nursing Diagnoses: Knowledge deficit related to the wound healing center program Goals: Patient/caregiver will verbalize understanding of the Wound Healing Center Program Date Initiated: 01/07/2017 Target Resolution Date: 04/03/2017 Goal Status: Active Interventions: Provide education on orientation to the wound center Notes: ` Pressure Nursing Diagnoses: Knowledge deficit related to causes and risk factors for pressure ulcer development Goals: Patient will remain free from development of additional pressure ulcers Date Initiated: 01/07/2017 Target Resolution Date: 04/03/2017 Goal Status: Active Interventions: Assess potential for pressure ulcer upon admission and as needed Notes: ` Wound/Skin Impairment Nursing Diagnoses: Impaired tissue integrity Goals: Patient/caregiver will verbalize understanding of skin care regimen Date Initiated: 01/07/2017 Target Resolution Date: 04/03/2017 Goal Status: Active Ulcer/skin breakdown will have a volume reduction of 30% by week 4 Date Initiated: 01/07/2017 Target Resolution Date: 04/03/2017 Goal Status: Active Ulcer/skin breakdown will  have a volume reduction of 50% by week 8 Date Initiated: 01/07/2017 Target Resolution Date: 04/03/2017 Goal Status: Active Ulcer/skin breakdown will have a volume reduction of 80% by week 12 ELEORA, SUTHERLAND ([id number]) Date Initiated: 01/07/2017 Target Resolution Date: 04/03/2017 Goal Status: Active Ulcer/skin breakdown will heal within 14 weeks Date Initiated: 01/07/2017 Target Resolution Date: 04/03/2017 Goal Status: Active Interventions: Assess patient/caregiver ability to obtain necessary supplies Assess patient/caregiver ability to perform ulcer/skin care regimen upon admission and as needed Assess ulceration(s) every visit Notes: Electronic Signature(s) Signed: 04/14/2017 10:36:57 AM By: Curtis Sites Entered By: Curtis Sites on 04/14/2017 10:36:57 [name] ([id number]) -------------------------------------------------------------------------------- Pain Assessment Details Patient Name: [name] Date of Service: 04/14/2017 10:15 AM Medical Record Patient Account Number: [id number] Number: Treating RN: Curtis Sites Jan 28, 1923 (83 y.o. Other Clinician: Date of Birth/Sex: Female) Treating ROBSON,  MICHAEL Primary Care Yoskar Murrillo: Vonita Moss Daysen Gundrum/Extender: G Referring Kermitt Harjo: Vonita Moss Weeks in Treatment: 13 Active Problems Location of Pain Severity and Description of Pain Patient Has Paino Patient Unable to Respond Site Locations Pain Management and Medication Current Pain Management: Electronic Signature(s) Signed: 04/14/2017 5:46:25 PM By: Curtis Sites Entered By: Curtis Sites on 04/14/2017 10:25:49 [name] ([id number]) -------------------------------------------------------------------------------- Patient/Caregiver Education Details Patient Name: [name] Date of Service: 04/14/2017 10:15 AM Medical Record Patient Account Number: [id number] Number: Treating RN: Curtis Sites [birth year] (83 y.o.  Other Clinician: Date of Birth/Gender: Female) Treating ROBSON, MICHAEL Primary Care Physician: Vonita Moss Physician/Extender: G Referring Physician: Veverly Fells in Treatment: 13 Education Assessment Education Provided To: Caregiver Education Topics Provided Wound/Skin Impairment: Handouts: Other: wound care as ordered Methods: Explain/Verbal Responses: State content correctly Electronic Signature(s) Signed: 04/14/2017 5:46:25 PM By: Curtis Sites Entered By: Curtis Sites on 04/14/2017 10:28:50 [name] ([id number]) -------------------------------------------------------------------------------- Wound Assessment Details Patient Name: [name] Date of Service: 04/14/2017 10:15 AM Medical Record Patient Account Number: [id number] Number: Treating RN: Curtis Sites [birth year] (83 y.o. Other Clinician: Date of Birth/Sex: Female) Treating ROBSON, MICHAEL Primary Care Marliyah Reid: Vonita Moss Salimatou Simone/Extender: G Referring Kensleigh Gates: Vonita Moss Weeks in Treatment: 13 Wound Status Wound Number: 1 Primary Etiology: Pressure Ulcer Wound Location: Coccyx - Midline Wound Status: Open Wounding Event: Pressure Injury Comorbid History: Anemia, Hypertension, Dementia Date Acquired: 12/01/2016 Weeks Of Treatment: 13 Clustered Wound: No Photos Wound Measurements Length: (cm) 5 % Reduction in Area: Width: (cm) 4.5 % Reduction in Volum Depth: (cm) 1.3 Epithelialization: Area: (cm) 17.671 Tunneling: Volume: (cm) 22.973 Undermining: -325.3% e: -1282.3% None No No Wound Description Classification: Category/Stage III Foul Odor After Clea Wound Margin: Flat and Intact Slough/Fibrino Exudate Amount: Large Exudate Type: Serous Exudate Color: amber nsing: No Yes Wound Bed Granulation Amount: Small (1-33%) Exposed Structure Granulation Quality: Pink Fascia Exposed: No Necrotic Amount: Large (67-100%) Fat Layer (Subcutaneous  Tissue) Exposed: Yes RUTHENE, METHVIN ([id number]) Necrotic Quality: Eschar, Adherent Slough Tendon Exposed: No Muscle Exposed: No Joint Exposed: No Bone Exposed: No Periwound Skin Texture Texture Color No Abnormalities Noted: No No Abnormalities Noted: No Callus: No Atrophie Blanche: No Crepitus: No Cyanosis: No Excoriation: No Ecchymosis: No Induration: Yes Erythema: Yes Rash: No Erythema Location: Circumferential Scarring: No Hemosiderin Staining: No Mottled: No Moisture Pallor: No No Abnormalities Noted: No Rubor: No Dry / Scaly: No Maceration: No Temperature / Pain Temperature: No Abnormality Tenderness on Palpation: Yes Wound Preparation Ulcer Cleansing: Rinsed/Irrigated with Saline Topical Anesthetic Applied: Other: lidocaine 4%, Treatment Notes Wound #1 (Midline Coccyx) 1. Cleansed with: Clean wound with Normal Saline 2. Anesthetic Topical Lidocaine 4% cream to wound bed prior to debridement 4. Dressing Applied: Aquacel Ag 5. Secondary Dressing Applied Bordered Foam Dressing Foam Electronic Signature(s) Signed: 04/14/2017 10:36:35 AM By: Curtis Sites Entered By: Curtis Sites on 04/14/2017 10:36:35 [name] ([id number]) -------------------------------------------------------------------------------- Vitals Details Patient Name: [name] Date of Service: 04/14/2017 10:15 AM Medical Record Patient Account Number: [id number] Number: Treating RN: Curtis Sites Oct 24, 1922 (83 y.o. Other Clinician: Date of Birth/Sex: Female) Treating ROBSON, MICHAEL Primary Care Malone Admire: Vonita Moss Dorr Perrot/Extender: G Referring Kurtiss Wence: Vonita Moss Weeks in Treatment: 13 Vital Signs Time Taken: 10:25 Temperature (F): 98.4 Pulse (bpm): 60 Respiratory Rate (breaths/min): 14 Blood Pressure (mmHg): 132/83 Reference Range: 80 - 120 mg / dl Electronic Signature(s) Signed: 04/14/2017 5:46:25 PM By: Curtis Sites Entered By:  Curtis Sites on 04/14/2017 10:27:28

## 2017-04-22 ENCOUNTER — Encounter: Payer: Medicare Other | Admitting: Internal Medicine

## 2017-04-22 DIAGNOSIS — L89150 Pressure ulcer of sacral region, unstageable: Secondary | ICD-10-CM | POA: Diagnosis not present

## 2017-04-23 ENCOUNTER — Telehealth: Payer: Self-pay

## 2017-04-23 DIAGNOSIS — L8990 Pressure ulcer of unspecified site, unspecified stage: Secondary | ICD-10-CM

## 2017-04-23 NOTE — Telephone Encounter (Signed)
Home health referral sent to [facility]. 

## 2017-04-24 NOTE — Telephone Encounter (Signed)
Home Health requires Face to face visit w/ in 30 days. Referral has been denied by home health companies due to this. Please advise.

## 2017-04-24 NOTE — Progress Notes (Signed)
Tina MainlandSHAW, Sharita W. ([id number]) Visit Report for 04/22/2017 Arrival Information Details Patient Name: Tina MainlandSHAW, [name] Date of Service: 04/22/2017 9:15 AM Medical Record Patient Account Number: [id number] Number: Treating RN: Huel CoventryWoody, Kim 1923-04-27 (83 y.o. Other Clinician: Date of Birth/Sex: Female) Treating ROBSON, MICHAEL Primary Care Arrington Yohe: Vonita MossRISSMAN, MARK Chelbi Herber/Extender: G Referring Quintina Hakeem: Vonita MossRISSMAN, MARK Weeks in Treatment: 15 Visit Information History Since Last Visit Added or deleted any medications: No Patient Arrived: Wheel Chair Any new allergies or adverse No reactions: Arrival Time: 09:30 Had a fall or experienced change in No Accompanied By: daughter activities of daily living that may Transfer Assistance: None affect Patient Identification Verified: Yes risk of falls: Secondary Verification Process Yes Signs or symptoms of abuse/neglect No Completed: since last visito Patient Requires Transmission-Based No Hospitalized since last visit: No Precautions: Has Dressing in Place as Yes Patient Has Alerts: No Prescribed: Pain Present Now: Unable to Respond Electronic Signature(s) Signed: 04/22/2017 6:30:52 PM By: Elliot GurneyWoody, BSN, RN, CWS, Kim RN, BSN Entered By: Elliot GurneyWoody, BSN, RN, CWS, Kim on 04/22/2017 09:33:48 Tina MainlandSHAW, Shavanna W. ([id number]) -------------------------------------------------------------------------------- Clinic Level of Care Assessment Details Patient Name: Tina MainlandSHAW, [name] Date of Service: 04/22/2017 9:15 AM Medical Record Patient Account Number: [id number] Number: Treating RN: Huel CoventryWoody, Kim 1923-04-27 (83 y.o. Other Clinician: Date of Birth/Sex: Female) Treating ROBSON, MICHAEL Primary Care Bhavesh Vazquez: Vonita MossRISSMAN, MARK Lillieanna Tuohy/Extender: G Referring Reagen Goates: Vonita MossRISSMAN, MARK Weeks in Treatment: 15 Clinic Level of Care Assessment Items TOOL 4 Quantity Score []  - Use when only an EandM is performed on FOLLOW-UP visit  0 ASSESSMENTS - Nursing Assessment / Reassessment []  - Reassessment of Co-morbidities (includes updates in patient status) 0 X - Reassessment of Adherence to Treatment Plan 1 5 ASSESSMENTS - Wound and Skin Assessment / Reassessment X - Simple Wound Assessment / Reassessment - one wound 1 5 []  - Complex Wound Assessment / Reassessment - multiple wounds 0 []  - Dermatologic / Skin Assessment (not related to wound area) 0 ASSESSMENTS - Focused Assessment []  - Circumferential Edema Measurements - multi extremities 0 []  - Nutritional Assessment / Counseling / Intervention 0 []  - Lower Extremity Assessment (monofilament, tuning fork, pulses) 0 []  - Peripheral Arterial Disease Assessment (using hand held doppler) 0 ASSESSMENTS - Ostomy and/or Continence Assessment and Care []  - Incontinence Assessment and Management 0 []  - Ostomy Care Assessment and Management (repouching, etc.) 0 PROCESS - Coordination of Care X - Simple Patient / Family Education for ongoing care 1 15 []  - Complex (extensive) Patient / Family Education for ongoing care 0 X - Staff obtains ChiropractorConsents, Records, Test Results / Process Orders 1 10 []  - Staff telephones HHA, Nursing Homes / Clarify orders / etc 0 Tina MainlandSHAW, [name] ([id number]) []  - Routine Transfer to another Facility (non-emergent condition) 0 []  - Routine Hospital Admission (non-emergent condition) 0 []  - New Admissions / Manufacturing engineernsurance Authorizations / Ordering NPWT, Apligraf, etc. 0 []  - Emergency Hospital Admission (emergent condition) 0 X - Simple Discharge Coordination 1 10 []  - Complex (extensive) Discharge Coordination 0 PROCESS - Special Needs []  - Pediatric / Minor Patient Management 0 []  - Isolation Patient Management 0 []  - Hearing / Language / Visual special needs 0 []  - Assessment of Community assistance (transportation, D/C planning, etc.) 0 []  - Additional assistance / Altered mentation 0 []  - Support Surface(s) Assessment (bed, cushion, seat, etc.)  0 INTERVENTIONS - Wound Cleansing / Measurement X - Simple Wound Cleansing - one wound 1 5 []  - Complex Wound Cleansing - multiple wounds 0 X -  Wound Imaging (photographs - any number of wounds) 1 5 []  - Wound Tracing (instead of photographs) 0 X - Simple Wound Measurement - one wound 1 5 []  - Complex Wound Measurement - multiple wounds 0 INTERVENTIONS - Wound Dressings []  - Small Wound Dressing one or multiple wounds 0 X - Medium Wound Dressing one or multiple wounds 1 15 []  - Large Wound Dressing one or multiple wounds 0 []  - Application of Medications - topical 0 []  - Application of Medications - injection 0 LAURISA, SAHAKIAN. ([id number]) INTERVENTIONS - Miscellaneous []  - External ear exam 0 []  - Specimen Collection (cultures, biopsies, blood, body fluids, etc.) 0 []  - Specimen(s) / Culture(s) sent or taken to Lab for analysis 0 []  - Patient Transfer (multiple staff / Michiel Sites Lift / Similar devices) 0 []  - Simple Staple / Suture removal (25 or less) 0 []  - Complex Staple / Suture removal (26 or more) 0 []  - Hypo / Hyperglycemic Management (close monitor of Blood Glucose) 0 []  - Ankle / Brachial Index (ABI) - do not check if billed separately 0 X - Vital Signs 1 5 Has the patient been seen at the hospital within the last three years: Yes Total Score: 80 Level Of Care: New/Established - Level 3 Electronic Signature(s) Signed: 04/22/2017 6:30:52 PM By: Elliot Gurney, BSN, RN, CWS, Kim RN, BSN Entered By: Elliot Gurney, BSN, RN, CWS, Kim on 04/22/2017 18:11:17 [name] ([id number]) -------------------------------------------------------------------------------- Encounter Discharge Information Details Patient Name: [name] Date of Service: 04/22/2017 9:15 AM Medical Record Patient Account Number: [id number] Number: Treating RN: Huel Coventry [birth year] (83 y.o. Other Clinician: Date of Birth/Sex: Female) Treating ROBSON, MICHAEL Primary Care [place] Acklin: Vonita Moss Ryliegh Mcduffey/Extender: G Referring Anyelin Mogle: Vonita Moss Weeks in Treatment: 15 Encounter Discharge Information Items Schedule Follow-up Appointment: No Medication Reconciliation completed No and provided to Patient/Care Margrette Wynia: Provided on Clinical Summary of Care: 04/22/2017 Form Type Recipient Paper Patient ES Electronic Signature(s) Signed: 04/22/2017 10:26:07 AM By: Gwenlyn Perking Entered By: Gwenlyn Perking on 04/22/2017 10:26:06 [name] ([id number]) -------------------------------------------------------------------------------- Multi Wound Chart Details Patient Name: [name] Date of Service: 04/22/2017 9:15 AM Medical Record Patient Account Number: [id number] Number: Treating RN: Huel Coventry 27-Jan-1923 (83 y.o. Other Clinician: Date of Birth/Sex: Female) Treating ROBSON, MICHAEL Primary Care Herald Vallin: Vonita Moss Sashia Campas/Extender: G Referring Rachit Grim: Vonita Moss Weeks in Treatment: 15 Vital Signs Height(in): Pulse(bpm): 98 Weight(lbs): Blood Pressure 103/77 (mmHg): Body Mass Index(BMI): Temperature(F): 98.4 Respiratory Rate 16 (breaths/min): Photos: [N/A:N/A] Wound Location: Coccyx - Midline N/A N/A Wounding Event: Pressure Injury N/A N/A Primary Etiology: Pressure Ulcer N/A N/A Comorbid History: Anemia, Hypertension, N/A N/A Dementia Date Acquired: 12/01/2016 N/A N/A Weeks of Treatment: 15 N/A N/A Wound Status: Open N/A N/A Measurements L x W x D 4.52x3.7x1.2 N/A N/A (cm) Area (cm) : 13.135 N/A N/A Volume (cm) : 15.762 N/A N/A % Reduction in Area: -216.10% N/A N/A % Reduction in Volume: -848.40% N/A N/A Starting Position 1 8 (o'clock): Ending Position 1 2 (o'clock): Maximum Distance 1 1.3 (cm): Undermining: Yes N/A N/A Classification: Category/Stage III N/A N/A SHAKYA, SEBRING ([id number]) Exudate Amount: Large N/A N/A Exudate Type: Serous N/A N/A Exudate Color: amber N/A N/A Wound Margin: Flat and  Intact N/A N/A Granulation Amount: Small (1-33%) N/A N/A Granulation Quality: Pink N/A N/A Necrotic Amount: Large (67-100%) N/A N/A Necrotic Tissue: Eschar, Adherent Slough N/A N/A Exposed Structures: Fat Layer (Subcutaneous N/A N/A Tissue) Exposed: Yes Fascia: No Tendon: No Muscle: No Joint: No Bone: No  Epithelialization: None N/A N/A Periwound Skin Texture: Induration: Yes N/A N/A Excoriation: No Callus: No Crepitus: No Rash: No Scarring: No Periwound Skin Maceration: No N/A N/A Moisture: Dry/Scaly: No Periwound Skin Color: Erythema: Yes N/A N/A Atrophie Blanche: No Cyanosis: No Ecchymosis: No Hemosiderin Staining: No Mottled: No Pallor: No Rubor: No Erythema Location: Circumferential N/A N/A Temperature: No Abnormality N/A N/A Tenderness on Yes N/A N/A Palpation: Wound Preparation: Ulcer Cleansing: N/A N/A Rinsed/Irrigated with Saline Topical Anesthetic Applied: Other: lidocaine 4% Treatment Notes Electronic Signature(s) Signed: 04/22/2017 6:25:53 PM By: Baltazar Najjar MD Entered By: Baltazar Najjar on 04/22/2017 16:42:07 [name] ([id number]) ARLIENE, ROSENOW ([id number]) -------------------------------------------------------------------------------- Multi-Disciplinary Care Plan Details Patient Name: [name] Date of Service: 04/22/2017 9:15 AM Medical Record Patient Account Number: [id number] Number: Treating RN: Huel Coventry 03/27/1923 (83 y.o. Other Clinician: Date of Birth/Sex: Female) Treating ROBSON, MICHAEL Primary Care Maycee Blasco: Vonita Moss Lari Linson/Extender: G Referring Chaseton Yepiz: Vonita Moss Weeks in Treatment: 15 Active Inactive ` Abuse / Safety / Falls / Self Care Management Nursing Diagnoses: Impaired physical mobility Potential for falls Goals: Patient will remain injury free Date Initiated: 01/07/2017 Target Resolution Date: 04/03/2017 Goal Status: Active Interventions: Assess fall risk on admission  and as needed Notes: ` Nutrition Nursing Diagnoses: Potential for alteratiion in Nutrition/Potential for imbalanced nutrition Goals: Patient/caregiver agrees to and verbalizes understanding of need to use nutritional supplements and/or vitamins as prescribed Date Initiated: 01/07/2017 Target Resolution Date: 04/03/2017 Goal Status: Active Interventions: Assess patient nutrition upon admission and as needed per policy Notes: ` Orientation to the Wound Care Program RAELA, BOHL ([id number]) Nursing Diagnoses: Knowledge deficit related to the wound healing center program Goals: Patient/caregiver will verbalize understanding of the Wound Healing Center Program Date Initiated: 01/07/2017 Target Resolution Date: 04/03/2017 Goal Status: Active Interventions: Provide education on orientation to the wound center Notes: ` Pressure Nursing Diagnoses: Knowledge deficit related to causes and risk factors for pressure ulcer development Goals: Patient will remain free from development of additional pressure ulcers Date Initiated: 01/07/2017 Target Resolution Date: 04/03/2017 Goal Status: Active Interventions: Assess potential for pressure ulcer upon admission and as needed Notes: ` Wound/Skin Impairment Nursing Diagnoses: Impaired tissue integrity Goals: Patient/caregiver will verbalize understanding of skin care regimen Date Initiated: 01/07/2017 Target Resolution Date: 04/03/2017 Goal Status: Active Ulcer/skin breakdown will have a volume reduction of 30% by week 4 Date Initiated: 01/07/2017 Target Resolution Date: 04/03/2017 Goal Status: Active Ulcer/skin breakdown will have a volume reduction of 50% by week 8 Date Initiated: 01/07/2017 Target Resolution Date: 04/03/2017 Goal Status: Active Ulcer/skin breakdown will have a volume reduction of 80% by week 12 PEYTYN, TRINE ([id number]) Date Initiated: 01/07/2017 Target Resolution Date: 04/03/2017 Goal Status: Active Ulcer/skin  breakdown will heal within 14 weeks Date Initiated: 01/07/2017 Target Resolution Date: 04/03/2017 Goal Status: Active Interventions: Assess patient/caregiver ability to obtain necessary supplies Assess patient/caregiver ability to perform ulcer/skin care regimen upon admission and as needed Assess ulceration(s) every visit Notes: Electronic Signature(s) Signed: 04/22/2017 6:30:52 PM By: Elliot Gurney, BSN, RN, CWS, Kim RN, BSN Entered By: Elliot Gurney, BSN, RN, CWS, Kim on 04/22/2017 09:56:07 [name] ([id number]) -------------------------------------------------------------------------------- Pain Assessment Details Patient Name: [name] Date of Service: 04/22/2017 9:15 AM Medical Record Patient Account Number: [id number] Number: Treating RN: Huel Coventry 02-15-1923 (83 y.o. Other Clinician: Date of Birth/Sex: Female) Treating ROBSON, MICHAEL Primary Care Sedale Jenifer: Vonita Moss Tahira Olivarez/Extender: G Referring Jenene Kauffmann: Vonita Moss Weeks in Treatment: 15 Active Problems Location of Pain Severity and Description of Pain Patient  Has Paino Patient Unable to Respond Site Locations Pain Management and Medication Current Pain Management: Electronic Signature(s) Signed: 04/22/2017 6:30:52 PM By: Elliot GurneyWoody, BSN, RN, CWS, Kim RN, BSN Entered By: Elliot GurneyWoody, BSN, RN, CWS, Kim on 04/22/2017 09:33:56 Tina MainlandSHAW, Defne W. ([id number]) -------------------------------------------------------------------------------- Wound Assessment Details Patient Name: Tina MainlandSHAW, Tylie W. Date of Service: 04/22/2017 9:15 AM Medical Record Patient Account Number: [id number] Number: Treating RN: Huel CoventryWoody, Kim 05-Jun-1923 (83 y.o. Other Clinician: Date of Birth/Sex: Female) Treating ROBSON, MICHAEL Primary Care Dontray Haberland: Vonita MossRISSMAN, MARK Joyice Magda/Extender: G Referring Shanelle Clontz: Vonita MossRISSMAN, MARK Weeks in Treatment: 15 Wound Status Wound Number: 1 Primary Etiology: Pressure Ulcer Wound Location: Coccyx -  Midline Wound Status: Open Wounding Event: Pressure Injury Comorbid History: Anemia, Hypertension, Dementia Date Acquired: 12/01/2016 Weeks Of Treatment: 15 Clustered Wound: No Photos Wound Measurements Length: (cm) 4.52 % Reduction in Area: Width: (cm) 3.7 % Reduction in Volum Depth: (cm) 1.2 Epithelialization: Area: (cm) 13.135 Undermining: Volume: (cm) 15.762 Starting Positio Ending Position ( Maximum Distance: -216.1% e: -848.4% None Yes n (o'clock): 8 o'clock): 2 (cm) 1.3 Wound Description Classification: Category/Stage III Foul Odor After Clea Wound Margin: Flat and Intact Slough/Fibrino Exudate Amount: Large Exudate Type: Serous Exudate Color: amber nsing: No Yes Wound Bed Granulation Amount: Small (1-33%) Exposed Structure Granulation Quality: Pink Fascia Exposed: No Necrotic Amount: Large (67-100%) Fat Layer (Subcutaneous Tissue) Exposed: Yes Tina MainlandSHAW, Loralye W. ([id number]) Necrotic Quality: Eschar, Adherent Slough Tendon Exposed: No Muscle Exposed: No Joint Exposed: No Bone Exposed: No Periwound Skin Texture Texture Color No Abnormalities Noted: No No Abnormalities Noted: No Callus: No Atrophie Blanche: No Crepitus: No Cyanosis: No Excoriation: No Ecchymosis: No Induration: Yes Erythema: Yes Rash: No Erythema Location: Circumferential Scarring: No Hemosiderin Staining: No Mottled: No Moisture Pallor: No No Abnormalities Noted: No Rubor: No Dry / Scaly: No Maceration: No Temperature / Pain Temperature: No Abnormality Tenderness on Palpation: Yes Wound Preparation Ulcer Cleansing: Rinsed/Irrigated with Saline Topical Anesthetic Applied: Other: lidocaine 4%, Electronic Signature(s) Signed: 04/22/2017 6:30:52 PM By: Elliot GurneyWoody, BSN, RN, CWS, Kim RN, BSN Entered By: Elliot GurneyWoody, BSN, RN, CWS, Kim on 04/22/2017 09:55:52 Tina MainlandSHAW, Dossie W. ([id number]) -------------------------------------------------------------------------------- Vitals  Details Patient Name: Tina MainlandSHAW, Jasmain W. Date of Service: 04/22/2017 9:15 AM Medical Record Patient Account Number: [id number] Number: Treating RN: Huel CoventryWoody, Kim 05-Jun-1923 (83 y.o. Other Clinician: Date of Birth/Sex: Female) Treating ROBSON, MICHAEL Primary Care Haely Leyland: Vonita MossRISSMAN, MARK Dreana Britz/Extender: G Referring Abbygael Curtiss: Vonita MossRISSMAN, MARK Weeks in Treatment: 15 Vital Signs Time Taken: 09:34 Temperature (F): 98.4 Pulse (bpm): 98 Respiratory Rate (breaths/min): 16 Blood Pressure (mmHg): 103/77 Reference Range: 80 - 120 mg / dl Electronic Signature(s) Signed: 04/22/2017 6:30:52 PM By: Elliot GurneyWoody, BSN, RN, CWS, Kim RN, BSN Entered By: Elliot GurneyWoody, BSN, RN, CWS, Kim on 04/22/2017 09:35:17

## 2017-04-24 NOTE — Progress Notes (Signed)
ARI, ENGELBRECHT ([id number]) Visit Report for 04/22/2017 HPI Details Patient Name: [name], [name] Date of Service: 04/22/2017 9:15 AM Medical Record Patient Account Number: [id number] Number: Treating RN: Huel Coventry May 16, 1923 (83 y.o. Other Clinician: Date of Birth/Sex: Female) Treating ROBSON, MICHAEL Primary Care Provider: Vonita Moss Provider/Extender: G Referring Provider: Vonita Moss Weeks in Treatment: 15 History of Present Illness HPI Description: 01/07/17 this is a 83 year old woman admitted to the clinic today for review of a pressure ulcer on her lower sacrum. She is referred from her primary physician's office after being seen on 3/22 with a 3 cm pressure area. Her daughter and caretaker accompanied her today state that the area first became obvious about a month ago and his since deteriorated. They have recently got [name] a home health involved and have been using [name] to the wound. They have ordered a pressure relief surface for her mattress. They are turning her religiously. They state that she eats well and they've been forcing fluids on her. She is on a multivitamin. Looking through [facility] point last albumin I see was 4.4 on 10/28. The patient has advanced parkinsonism which looks superficially like advanced Parkinson's disease although her daughter tells me she did not ever respond to Sinemet therefore this may have another pathology with signs of parkinsonism. However I think this is largely a mute point currently. She also has dementia and is nonambulatory. Since this started they have been keeping her in bed and turning her religiously every 2 hours. She lives at home in [place] with her husband with 24/7 care giving 01/13/17 santyl change qd. Still will require further debridement. continue santyl. 01/20/17; patient's wound actually looks some better less adherent necrotic surface. There is actually visible granulation. We're using  [name] 01/27/17; better-looking surface but still a lot of necrotic tissue on the base of this wound. The periwound erythema is better than last week we are still using [name]. Her daughter tells [place] that she is still having trouble with the pressure-relief mattress through medical modalities 02/03/18; I had the patient scheduled for a two week followup however her daughter brought her in early concerned for discoloration on 2 areas of the wound circumference. We have bee using santyl 02/10/17;Better looking surface to the wound. Rim appears better suggesting better offloading. Using santyl 02/24/17; change to Silver Collegen last time. Wound appears better. 03/10/17; still using silver collagen religious offloading. Intake is satisfactory per her daughter. Dimension slightly better 03/12/2017 -- Dr. Jannetta Quint patient who had been seen 2 days ago and was doing fairly well. The patient is brought in by her daughter who noticed a new wound just above the previous wound on her sacral area and going on more to the left lateral side. She was very concerned and we asked her to get in for an opinion. 03/17/17; above is noted. The patient has developed a progressive area to the left of her original wound. This seems to this started with a ring of red skin with a more pale interior almost looking fungal. There was a rim of blister through part of the area although this did not look like zoster. They have been applying triamcinolone that was prescribed last week by Dr. Meyer Russel and the area has a fold to a linear band area which is confluent, erythematous and with obvious epidermal swelling but there is no overt tenderness or crepitus. CARNISHA, FELTZ ([id number]) She has lost some surface epithelium closer to the wound surface itself and now has a  more superficial wound in this area and the extending erythema goes towards the left buttock. This is well demarcated between involved in normal skin but once again does  not appear to be at all tender. If there is a contact issue here I cannot get the history out of the daughter or the caregiver that are with her. 03/24/17; the patient arrives today with the wound slightly worse slightly more drainage. The bandlike area of erythema that I treated as a possible pineal infection has improved somewhat although proximally is still has confluent erythema without overt tenderness. We have been using silver alginate since the most recent deterioration. To the bandlike degree of erythema we have been using Lotrisone cream 03/31/17; patient arrives today with the wound slightly larger, necrotic surface and surrounding erythema. The bandlike area of erythema that I treated as a possible pineal infection is less swollen but still present I've been using Lotrisone cream on that largely related to the presence of a tinea looking infection when this was first seen. We've been using silver alginate. X-ray that I ordered last week showed no acute bony abnormalities mild fecal impaction. Lab work showed a comprehensive metabolic panel that was normal including an albumin of 3.8. White count was 9.5 hemoglobin 12.3 differential count normal. C-reactive protein was less than 1 and sedimentation rate and 17. The latter 2 values does not support an ongoing bacterial infection. 04/14/17; patient arrives after a 2 week hiatus. Her wound is not in good condition. Although the base of the wound looks stable she still has an erythematous area that was apparently blistered over the weekend. This again points to the left. As our intake nurse pointed out today this is in the area where the tissue folds together and we may need to prevent try to prevent this. Lab work and x-ray that I did to 3 weeks ago were unremarkable including her albumin nevertheless she is an extremely frail condition physically. We have have been using [name] 04/22/17; I changed her to silver alginate because of the  surrounding maceration and moisture last week. The daughter did not like the way the wound looked in the middle of the week and changed her back to [place]. They're putting gauze on top of this. Thinks still using Lotrisone. Electronic Signature(s) Signed: 04/22/2017 6:25:53 PM By: Baltazar Najjar MD Entered By: Baltazar Najjar on 04/22/2017 16:44:26 [name] ([id number]) -------------------------------------------------------------------------------- Physical Exam Details Patient Name: [name] Date of Service: 04/22/2017 9:15 AM Medical Record Patient Account Number: [id number] Number: Treating RN: Huel Coventry Feb 21, 1923 (83 y.o. Other Clinician: Date of Birth/Sex: Female) Treating ROBSON, MICHAEL Primary Care Provider: Vonita Moss Provider/Extender: G Referring Provider: Vonita Moss Weeks in Treatment: 15 Constitutional Sitting or standing Blood Pressure is within target range for patient.. Pulse regular and within target range for patient.Marland Kitchen Respirations regular, non-labored and within target range.. Temperature is normal and within the target range for the patient.. Very frail woman advanced dementia but no distress.. Eyes Keeps her eyes closed. Cardiovascular Appears to be adequately hydrated. Gastrointestinal (GI) Abdomen is soft and non-distended without masses or tenderness. Bowel sounds active in all quadrants.. No liver or spleen enlargement or tenderness.. Genitourinary (GU) Bladder without fullness, masses or tenderness.. Notes Wound exam; not much change from last visit. The erythema to the left of the wound in its superior aspect which was almost the shape of the finger looks better. There is no overt infection. Not a lot of abnormal surface over the wound. As  per usual deep tissues around the wound fold over this adding to friction and moisture. This is one of the reasons I wanted to use alginate however I am not going to argue much  about this today. Electronic Signature(s) Signed: 04/22/2017 6:25:53 PM By: Baltazar Najjarobson, Michael MD Entered By: Baltazar Najjarobson, Michael on 04/22/2017 16:48:19 Elmarie MainlandSHAW, Ralynn W. ([id number]) -------------------------------------------------------------------------------- Physician Orders Details Patient Name: Elmarie MainlandSHAW, Breckyn W. Date of Service: 04/22/2017 9:15 AM Medical Record Patient Account Number: [id number] Number: Treating RN: Huel CoventryWoody, Kim 1922-12-29 (83 y.o. Other Clinician: Date of Birth/Sex: Female) Treating ROBSON, MICHAEL Primary Care Provider: Vonita MossRISSMAN, MARK Provider/Extender: G Referring Provider: Vonita MossRISSMAN, MARK Weeks in Treatment: 15 Verbal / Phone Orders: No Diagnosis Coding Wound Cleansing Wound #1 Midline Coccyx o Clean wound with Normal Saline. o May Shower, gently pat wound dry prior to applying new dressing. Anesthetic Wound #1 Midline Coccyx o Topical Lidocaine 4% cream applied to wound bed prior to debridement - in clinic Skin Barriers/Peri-Wound Care Wound #1 Midline Coccyx o Skin Prep Primary Wound Dressing Wound #1 Midline Coccyx o [name] Ointment Secondary Dressing Wound #1 Midline Coccyx o Boardered Foam Dressing - silicone bordered dressing due to skin breakdown o Dry Gauze Dressing Change Frequency Wound #1 Midline Coccyx o Change dressing every day. Follow-up Appointments Wound #1 Midline Coccyx o Return Appointment in 2 weeks. Off-Loading Wound #1 Midline Coccyx Elmarie MainlandSHAW, Dylin W. ([id number]) o [facility] wheelchair [facility] order roho cushion or gel cushion for patients wheelchair - whichever she qualifies for o Turn and reposition every 2 hours Additional Orders / Instructions Wound #1 Midline Coccyx o Increase protein intake. - please add protein supplements to patients diet o Other: - please add vitamin A, vitamin C and zinc supplements to patients diet Medications-please add to medication list. Wound  #1 Midline Coccyx o Other: - lotrisone cream Electronic Signature(s) Signed: 04/22/2017 6:25:53 PM By: Baltazar Najjarobson, Michael MD Signed: 04/22/2017 6:30:52 PM By: Elliot GurneyWoody, BSN, RN, CWS, Kim RN, BSN Entered By: Elliot GurneyWoody, BSN, RN, CWS, Kim on 04/22/2017 10:24:44 Elmarie MainlandSHAW, Shalissa W. ([id number]) -------------------------------------------------------------------------------- Problem List Details Patient Name: Elmarie MainlandSHAW, Brynlee W. Date of Service: 04/22/2017 9:15 AM Medical Record Patient Account Number: [id number] Number: Treating RN: Huel CoventryWoody, Kim 1922-12-29 (83 y.o. Other Clinician: Date of Birth/Sex: Female) Treating ROBSON, MICHAEL Primary Care Provider: Vonita MossRISSMAN, MARK Provider/Extender: G Referring Provider: Vonita MossRISSMAN, MARK Weeks in Treatment: 15 Active Problems ICD-10 Encounter Code Description Active Date Diagnosis L89.150 Pressure ulcer of sacral region, unstageable 01/07/2017 Yes G20 Parkinson's disease 01/07/2017 Yes Inactive Problems Resolved Problems Electronic Signature(s) Signed: 04/22/2017 6:25:53 PM By: Baltazar Najjarobson, Michael MD Entered By: Baltazar Najjarobson, Michael on 04/22/2017 16:41:57 Elmarie MainlandSHAW, Ader W. ([id number]) -------------------------------------------------------------------------------- Progress Note Details Patient Name: Elmarie MainlandSHAW, Gaby W. Date of Service: 04/22/2017 9:15 AM Medical Record Patient Account Number: [id number] Number: Treating RN: Huel CoventryWoody, Kim 1922-12-29 (83 y.o. Other Clinician: Date of Birth/Sex: Female) Treating ROBSON, MICHAEL Primary Care Provider: Vonita MossRISSMAN, MARK Provider/Extender: G Referring Provider: Vonita MossRISSMAN, MARK Weeks in Treatment: 15 Subjective History of Present Illness (HPI) 01/07/17 this is a 83 year old woman admitted to the clinic today for review of a pressure ulcer on her lower sacrum. She is referred from her primary physician's office after being seen on 3/22 with a 3 cm pressure area. Her daughter and caretaker accompanied her today  state that the area first became obvious about a month ago and his since deteriorated. They have recently got [name] a home health involved and have been using [name] to the wound. They have ordered a pressure  relief surface for her mattress. They are turning her religiously. They state that she eats well and they've been forcing fluids on her. She is on a multivitamin. Looking through [facility] point last albumin I see was 4.4 on 10/28. The patient has advanced parkinsonism which looks superficially like advanced Parkinson's disease although her daughter tells me she did not ever respond to Sinemet therefore this may have another pathology with signs of parkinsonism. However I think this is largely a mute point currently. She also has dementia and is nonambulatory. Since this started they have been keeping her in bed and turning her religiously every 2 hours. She lives at home in [place] with her husband with 24/7 care giving 01/13/17 santyl change qd. Still will require further debridement. continue santyl. 01/20/17; patient's wound actually looks some better less adherent necrotic surface. There is actually visible granulation. We're using [name] 01/27/17; better-looking surface but still a lot of necrotic tissue on the base of this wound. The periwound erythema is better than last week we are still using [name]. Her daughter tells [place] that she is still having trouble with the pressure-relief mattress through medical modalities 02/03/18; I had the patient scheduled for a two week followup however her daughter brought her in early concerned for discoloration on 2 areas of the wound circumference. We have bee using santyl 02/10/17;Better looking surface to the wound. Rim appears better suggesting better offloading. Using santyl 02/24/17; change to Silver Collegen last time. Wound appears better. 03/10/17; still using silver collagen religious offloading. Intake is satisfactory per her daughter.  Dimension slightly better 03/12/2017 -- Dr. Jannetta Quint patient who had been seen 2 days ago and was doing fairly well. The patient is brought in by her daughter who noticed a new wound just above the previous wound on her sacral area and going on more to the left lateral side. She was very concerned and we asked her to get in for an opinion. 03/17/17; above is noted. The patient has developed a progressive area to the left of her original wound. This seems to this started with a ring of red skin with a more pale interior almost looking fungal. There was a rim of blister through part of the area although this did not look like zoster. They have been applying triamcinolone that was prescribed last week by Dr. Meyer Russel and the area has a fold to a linear band area which is confluent, erythematous and with obvious epidermal swelling but there is no overt tenderness or crepitus. She has lost some surface epithelium closer to the wound surface itself and now has a more superficial wound in this area and the extending erythema goes towards the left buttock. This is well demarcated RENETTE, HSU. ([id number]) between involved in normal skin but once again does not appear to be at all tender. If there is a contact issue here I cannot get the history out of the daughter or the caregiver that are with her. 03/24/17; the patient arrives today with the wound slightly worse slightly more drainage. The bandlike area of erythema that I treated as a possible pineal infection has improved somewhat although proximally is still has confluent erythema without overt tenderness. We have been using silver alginate since the most recent deterioration. To the bandlike degree of erythema we have been using Lotrisone cream 03/31/17; patient arrives today with the wound slightly larger, necrotic surface and surrounding erythema. The bandlike area of erythema that I treated as a possible pineal infection is less swollen  but still  present I've been using Lotrisone cream on that largely related to the presence of a tinea looking infection when this was first seen. We've been using silver alginate. X-ray that I ordered last week showed no acute bony abnormalities mild fecal impaction. Lab work showed a comprehensive metabolic panel that was normal including an albumin of 3.8. White count was 9.5 hemoglobin 12.3 differential count normal. C-reactive protein was less than 1 and sedimentation rate and 17. The latter 2 values does not support an ongoing bacterial infection. 04/14/17; patient arrives after a 2 week hiatus. Her wound is not in good condition. Although the base of the wound looks stable she still has an erythematous area that was apparently blistered over the weekend. This again points to the left. As our intake nurse pointed out today this is in the area where the tissue folds together and we may need to prevent try to prevent this. Lab work and x-ray that I did to 3 weeks ago were unremarkable including her albumin nevertheless she is an extremely frail condition physically. We have have been using [name] 04/22/17; I changed her to silver alginate because of the surrounding maceration and moisture last week. The daughter did not like the way the wound looked in the middle of the week and changed her back to [place]. They're putting gauze on top of this. Thinks still using Lotrisone. Objective Constitutional Sitting or standing Blood Pressure is within target range for patient.. Pulse regular and within target range for patient.Marland Kitchen Respirations regular, non-labored and within target range.. Temperature is normal and within the target range for the patient.. Very frail woman advanced dementia but no distress.. Vitals Time Taken: 9:34 AM, Temperature: 98.4 F, Pulse: 98 bpm, Respiratory Rate: 16 breaths/min, Blood Pressure: 103/77 mmHg. Eyes Keeps her eyes closed. Cardiovascular Appears to be adequately  hydrated. ALLEGRA, CERNIGLIA ([id number]) Gastrointestinal (GI) Abdomen is soft and non-distended without masses or tenderness. Bowel sounds active in all quadrants.. No liver or spleen enlargement or tenderness.. Genitourinary (GU) Bladder without fullness, masses or tenderness.. General Notes: Wound exam; not much change from last visit. The erythema to the left of the wound in its superior aspect which was almost the shape of the finger looks better. There is no overt infection. Not a lot of abnormal surface over the wound. As per usual deep tissues around the wound fold over this adding to friction and moisture. This is one of the reasons I wanted to use alginate however I am not going to argue much about this today. Integumentary (Hair, Skin) Wound #1 status is Open. Original cause of wound was Pressure Injury. The wound is located on the Midline Coccyx. The wound measures 4.52cm length x 3.7cm width x 1.2cm depth; 13.135cm^2 area and 15.762cm^3 volume. There is Fat Layer (Subcutaneous Tissue) Exposed exposed. There is undermining starting at 8:00 and ending at 2:00 with a maximum distance of 1.3cm. There is a large amount of serous drainage noted. The wound margin is flat and intact. There is small (1-33%) pink granulation within the wound bed. There is a large (67-100%) amount of necrotic tissue within the wound bed including Eschar and Adherent Slough. The periwound skin appearance exhibited: Induration, Erythema. The periwound skin appearance did not exhibit: Callus, Crepitus, Excoriation, Rash, Scarring, Dry/Scaly, Maceration, Atrophie Blanche, Cyanosis, Ecchymosis, Hemosiderin Staining, Mottled, Pallor, Rubor. The surrounding wound skin color is noted with erythema which is circumferential. Periwound temperature was noted as No Abnormality. The periwound has tenderness on palpation. Assessment  Active Problems ICD-10 L89.150 - Pressure ulcer of sacral region, unstageable G20 -  Parkinson's disease Plan Wound Cleansing: Wound #1 Midline Coccyx: Clean wound with Normal Saline. May Shower, gently pat wound dry prior to applying new dressing. Anesthetic: Elmarie MainlandSHAW, Lear W. ([id number]) Wound #1 Midline Coccyx: Topical Lidocaine 4% cream applied to wound bed prior to debridement - in clinic Skin Barriers/Peri-Wound Care: Wound #1 Midline Coccyx: Skin Prep Primary Wound Dressing: Wound #1 Midline Coccyx: [name] Ointment Secondary Dressing: Wound #1 Midline Coccyx: Boardered Foam Dressing - silicone bordered dressing due to skin breakdown Dry Gauze Dressing Change Frequency: Wound #1 Midline Coccyx: Change dressing every day. Follow-up Appointments: Wound #1 Midline Coccyx: Return Appointment in 2 weeks. Off-Loading: Wound #1 Midline Coccyx: Roho cushion for wheelchair - [facility] please order roho cushion or gel cushion for patients wheelchair - whichever she qualifies for Turn and reposition every 2 hours Additional Orders / Instructions: Wound #1 Midline Coccyx: Increase protein intake. - please add protein supplements to patients diet Other: - please add vitamin A, vitamin C and zinc supplements to patients diet Medications-please add to medication list.: Wound #1 Midline Coccyx: Other: - lotrisone cream #1 continue the [name]. #2 zinc oxide around the wound #3 border foam dressing #4 they are to attempt to keep the folds apart Electronic Signature(s) Signed: 04/22/2017 6:25:53 PM By: Baltazar Najjarobson, Michael MD Entered By: Baltazar Najjarobson, Michael on 04/22/2017 16:49:37 Mceuen, Myles GipETTALENE W. ([id number]) Elmarie MainlandSHAW, Princessa W. ([id number]) -------------------------------------------------------------------------------- SuperBill Details Patient Name: Elmarie MainlandSHAW, Delcia W. Date of Service: 04/22/2017 Medical Record Patient Account Number: [id number] Number: Treating RN: Huel CoventryWoody, Kim [birth year] (83 y.o. Other Clinician: Date of Birth/Sex: Female) Treating ROBSON,  MICHAEL Primary Care Provider: Vonita MossRISSMAN, MARK Provider/Extender: G Referring Provider: Vonita MossRISSMAN, MARK Weeks in Treatment: 15 Diagnosis Coding ICD-10 Codes Code Description L89.150 Pressure ulcer of sacral region, unstageable G20 Parkinson's disease Facility Procedures CPT4 Code: [id number] Description: 99213 - WOUND CARE VISIT-LEV 3 EST PT Modifier: Quantity: 1 Physician Procedures CPT4 Code: [id number] Description: 99213 - WC PHYS LEVEL 3 - EST PT ICD-10 Description Diagnosis L89.150 Pressure ulcer of sacral region, unstageable Modifier: Quantity: 1 Electronic Signature(s) Signed: 04/22/2017 6:25:53 PM By: Baltazar Najjarobson, Michael MD Signed: 04/22/2017 6:30:52 PM By: Elliot GurneyWoody, BSN, RN, CWS, Kim RN, BSN Entered By: Elliot GurneyWoody, BSN, RN, CWS, Kim on 04/22/2017 18:11:34

## 2017-04-27 NOTE — Telephone Encounter (Signed)
Call daughter

## 2017-04-27 NOTE — Telephone Encounter (Signed)
Patient was transferred to provider for telephone conversation.   

## 2017-05-06 ENCOUNTER — Encounter: Payer: Medicare Other | Attending: Physician Assistant | Admitting: Physician Assistant

## 2017-05-06 DIAGNOSIS — G20 Parkinson's disease: Secondary | ICD-10-CM | POA: Insufficient documentation

## 2017-05-06 DIAGNOSIS — L89153 Pressure ulcer of sacral region, stage 3: Secondary | ICD-10-CM | POA: Insufficient documentation

## 2017-05-06 DIAGNOSIS — F0390 Unspecified dementia without behavioral disturbance: Secondary | ICD-10-CM | POA: Diagnosis not present

## 2017-05-08 NOTE — Progress Notes (Signed)
CAYDENCE, KOENIG ([id number]) Visit Report for 05/06/2017 Chief Complaint Document Details Patient Name: [name], [name] Date of Service: 05/06/2017 11:00 AM Medical Record Number: [id number] Patient Account Number: [id number] Date of Birth/Sex: [birth year] (83 y.o. Female) Treating RN: Curtis Sites Primary Care Provider: Vonita Moss Other Clinician: Referring Provider: Vonita Moss Treating Provider/Extender: Linwood Dibbles, HOYT Weeks in Treatment: 17 Information Obtained from: Patient Chief Complaint 01/07/17; patient is here for review of a sacral pressure ulcer Electronic Signature(s) Signed: 05/06/2017 6:07:57 PM By: Lenda Kelp PA-C Entered By: Lenda Kelp on 05/06/2017 11:47:55 [name] ([id number]) -------------------------------------------------------------------------------- Debridement Details Patient Name: [name] Date of Service: 05/06/2017 11:00 AM Medical Record Number: [id number] Patient Account Number: [id number] Date of Birth/Sex: 1923/03/18 (83 y.o. Female) Treating RN: Huel Coventry Primary Care Provider: Vonita Moss Other Clinician: Referring Provider: Vonita Moss Treating Provider/Extender: Linwood Dibbles, HOYT Weeks in Treatment: 17 Debridement Performed for Wound #1 Midline Coccyx Assessment: Performed By: Physician STONE III, HOYT E., PA-C Debridement: Debridement Pre-procedure Verification/Time Out Yes - 11:54 Taken: Start Time: 11:54 Pain Control: Lidocaine 4% Topical Solution Level: Skin/Subcutaneous Tissue Total Area Debrided (L x 4 (cm) x 3.1 (cm) = 12.4 (cm) W): Tissue and other Viable, Non-Viable, Fibrin/Slough, Subcutaneous material debrided: Instrument: Curette Bleeding: Minimum Hemostasis Achieved: Pressure End Time: 11:57 Procedural Pain: 0 Post Procedural Pain: 0 Response to Treatment: Procedure was tolerated well Post Debridement Measurements of Total Wound Length: (cm) 4 Stage: Category/Stage  III Width: (cm) 3.1 Depth: (cm) 1.3 Volume: (cm) 12.661 Character of Wound/Ulcer Post Improved Debridement: Post Procedure Diagnosis Same as Pre-procedure Electronic Signature(s) Signed: 05/06/2017 5:17:17 PM By: Elliot Gurney, BSN, RN, CWS, Kim RN, BSN Signed: 05/06/2017 6:07:57 PM By: Lenda Kelp PA-C Entered By: Elliot Gurney, BSN, RN, CWS, Kim on 05/06/2017 11:57:51 [name] ([id number]) -------------------------------------------------------------------------------- HPI Details Patient Name: [name] Date of Service: 05/06/2017 11:00 AM Medical Record Number: [id number] Patient Account Number: [id number] Date of Birth/Sex: 06-05-1923 (83 y.o. Female) Treating RN: Curtis Sites Primary Care Provider: Vonita Moss Other Clinician: Referring Provider: Vonita Moss Treating Provider/Extender: Linwood Dibbles, HOYT Weeks in Treatment: 17 History of Present Illness HPI Description: 01/07/17 this is a 83 year old woman admitted to the clinic today for review of a pressure ulcer on her lower sacrum. She is referred from her primary physician's office after being seen on 3/22 with a 3 cm pressure area. Her daughter and caretaker accompanied her today state that the area first became obvious about a month ago and his since deteriorated. They have recently got [name] a home health involved and have been using [name] to the wound. They have ordered a pressure relief surface for her mattress. They are turning her religiously. They state that she eats well and they've been forcing fluids on her. She is on a multivitamin. Looking through [facility] point last albumin I see was 4.4 on 10/28. The patient has advanced parkinsonism which looks superficially like advanced Parkinson's disease although her daughter tells me she did not ever respond to Sinemet therefore this may have another pathology with signs of parkinsonism. However I think this is largely a mute point currently. She also has  dementia and is nonambulatory. Since this started they have been keeping her in bed and turning her religiously every 2 hours. She lives at home in [place] with her husband with 24/7 care giving 01/13/17 santyl change qd. Still will require further debridement. continue santyl. 01/20/17; patient's wound actually looks some better less adherent necrotic surface. There is  actually visible granulation. We're using [name] 01/27/17; better-looking surface but still a lot of necrotic tissue on the base of this wound. The periwound erythema is better than last week we are still using [name]. Her daughter tells [place] that she is still having trouble with the pressure-relief mattress through medical modalities 02/03/18; I had the patient scheduled for a two week followup however her daughter brought her in early concerned for discoloration on 2 areas of the wound circumference. We have bee using santyl 02/10/17;Better looking surface to the wound. Rim appears better suggesting better offloading. Using santyl 02/24/17; change to Silver Collegen last time. Wound appears better. 03/10/17; still using silver collagen religious offloading. Intake is satisfactory per her daughter. Dimension slightly better 03/12/2017 -- Dr. Jannetta Quint patient who had been seen 2 days ago and was doing fairly well. The patient is brought in by her daughter who noticed a new wound just above the previous wound on her sacral area and going on more to the left lateral side. She was very concerned and we asked her to get in for an opinion. 03/17/17; above is noted. The patient has developed a progressive area to the left of her original wound. This seems to this started with a ring of red skin with a more pale interior almost looking fungal. There was a rim of blister through part of the area although this did not look like zoster. They have been applying triamcinolone that was prescribed last week by Dr. Meyer Russel and the area has a fold to a  linear band area which is confluent, erythematous and with obvious epidermal swelling but there is no overt tenderness or crepitus. She has lost some surface epithelium closer to the wound surface itself and now has a more superficial wound in this area and the extending erythema goes towards the left buttock. This is well demarcated between involved in normal skin but once again does not appear to be at all tender. If there is a contact issue here I cannot get the history out of the daughter or the caregiver that are with her. 03/24/17; the patient arrives today with the wound slightly worse slightly more drainage. The bandlike area of ANJANA, CHEEK. ([id number]) erythema that I treated as a possible pineal infection has improved somewhat although proximally is still has confluent erythema without overt tenderness. We have been using silver alginate since the most recent deterioration. To the bandlike degree of erythema we have been using Lotrisone cream 03/31/17; patient arrives today with the wound slightly larger, necrotic surface and surrounding erythema. The bandlike area of erythema that I treated as a possible pineal infection is less swollen but still present I've been using Lotrisone cream on that largely related to the presence of a tinea looking infection when this was first seen. We've been using silver alginate. X-ray that I ordered last week showed no acute bony abnormalities mild fecal impaction. Lab work showed a comprehensive metabolic panel that was normal including an albumin of 3.8. White count was 9.5 hemoglobin 12.3 differential count normal. C-reactive protein was less than 1 and sedimentation rate and 17. The latter 2 values does not support an ongoing bacterial infection. 04/14/17; patient arrives after a 2 week hiatus. Her wound is not in good condition. Although the base of the wound looks stable she still has an erythematous area that was apparently blistered over the  weekend. This again points to the left. As our intake nurse pointed out today this is in the area where  the tissue folds together and we may need to prevent try to prevent this. Lab work and x-ray that I did to 3 weeks ago were unremarkable including her albumin nevertheless she is an extremely frail condition physically. We have have been using [name] 04/22/17; I changed her to silver alginate because of the surrounding maceration and moisture last week. The daughter did not like the way the wound looked in the middle of the week and changed her back to [place]. They're putting gauze on top of this. Thinks still using Lotrisone. 05/06/17 on evaluation today patient sacral wound appears to be doing okay and does not seem to be any worse. She is having no significant pain during evaluation today the secondary to mental status she is unable to rate or describe whether she had any pain she was not however flinching. Her daughter states that the wound does appear to be looking better to her. Still we are having difficulty with the skinfold that seems to be closing in on itself at this point. All in all I feel like she is making some good progress in the [name] seems to be the official for her. They do tell me that a refill if we are gonna continue that today. No fevers, chills, nausea, or vomiting noted at this time. Electronic Signature(s) Signed: 05/06/2017 6:07:57 PM By: Lenda Kelp PA-C Entered By: Lenda Kelp on 05/06/2017 13:41:25 [name] ([id number]) -------------------------------------------------------------------------------- Physical Exam Details Patient Name: [name] Date of Service: 05/06/2017 11:00 AM Medical Record Number: [id number] Patient Account Number: [id number] Date of Birth/Sex: March 15, 1923 (83 y.o. Female) Treating RN: Curtis Sites Primary Care Provider: Vonita Moss Other Clinician: Referring Provider: Vonita Moss Treating Provider/Extender:  STONE III, HOYT Weeks in Treatment: 17 Constitutional Chronically ill appearing but in no apparent acute distress. Respiratory normal breathing without difficulty. clear to auscultation bilaterally. Psychiatric Patient is not able to cooperate in decision making regarding care. Patient has dementia. patient is confused. Notes This wound is slough covered on evaluation today. This did require sharp debridement which was tolerated well without any evidence of pain as patient was flinching otherwise indicating that she was hurting. I was able to remove a significant amount of the slough although there were some still remaining. Electronic Signature(s) Signed: 05/06/2017 6:07:57 PM By: Lenda Kelp PA-C Entered By: Lenda Kelp on 05/06/2017 13:42:26 [name] ([id number]) -------------------------------------------------------------------------------- Physician Orders Details Patient Name: [name] Date of Service: 05/06/2017 11:00 AM Medical Record Number: [id number] Patient Account Number: [id number] Date of Birth/Sex: [birth year] (83 y.o. Female) Treating RN: Curtis Sites Primary Care Provider: Vonita Moss Other Clinician: Referring Provider: Vonita Moss Treating Provider/Extender: Linwood Dibbles, HOYT Weeks in Treatment: 73 Verbal / Phone Orders: No Diagnosis Coding ICD-10 Coding Code Description L89.153 Pressure ulcer of sacral region, stage 3 G20 Parkinson's disease Wound Cleansing Wound #1 Midline Coccyx o Clean wound with Normal Saline. o May Shower, gently pat wound dry prior to applying new dressing. Anesthetic Wound #1 Midline Coccyx o Topical Lidocaine 4% cream applied to wound bed prior to debridement - in clinic Skin Barriers/Peri-Wound Care Wound #1 Midline Coccyx o Skin Prep Primary Wound Dressing Wound #1 Midline Coccyx o [name] Ointment Secondary Dressing Wound #1 Midline Coccyx o Dry Gauze o Boardered Foam Dressing -  silicone bordered dressing due to skin breakdown Dressing Change Frequency Wound #1 Midline Coccyx o Change dressing every day. Follow-up Appointments Wound #1 Midline Coccyx o Return Appointment in 1 week. [name], [name] (  [id number]) Off-Loading Wound #1 Midline Coccyx o Roho cushion for wheelchair - HHRN please order roho cushion or gel cushion for patients wheelchair - whichever she qualifies for o Turn and reposition every 2 hours Additional Orders / Instructions Wound #1 Midline Coccyx o Increase protein intake. - please add protein supplements to patients diet o Other: - please add vitamin A, vitamin C and zinc supplements to patients diet Home Health Wound #1 Midline Coccyx o Initiate Home Health for Skilled Nursing o Home Health Nurse may visit PRN to address patientos wound care needs. o FACE TO FACE ENCOUNTER: MEDICARE and MEDICAID PATIENTS: I certify that this patient is under my care and that I had a face-to-face encounter that meets the physician face-to-face encounter requirements with this patient on this date. The encounter with the patient was in whole or in part for the following MEDICAL CONDITION: (primary reason for Home Healthcare) MEDICAL NECESSITY: I certify, that based on my findings, NURSING services are a medically necessary home health service. HOME BOUND STATUS: I certify that my clinical findings support that this patient is homebound (i.e., Due to illness or injury, pt requires aid of supportive devices such as crutches, cane, wheelchairs, walkers, the use of special transportation or the assistance of another person to leave their place of residence. There is a normal inability to leave the home and doing so requires considerable and taxing effort. Other absences are for medical reasons / religious services and are infrequent or of short duration when for other reasons). o If current dressing causes regression in wound condition,  may D/C ordered dressing product/s and apply Normal Saline Moist Dressing daily until next Wound Healing Center / Other MD appointment. Notify Wound Healing Center of regression in wound condition at [phone]. o Please direct any NON-WOUND related issues/requests for orders to patient's Primary Care Physician - Dr Vonita Moss Medications-please add to medication list. Wound #1 Midline Coccyx o Other: - lotrisone cream Patient Medications Allergies: Phenergan, Sulfa (Sulfonamide Antibiotics) Notifications Medication Indication Start End [name] 05/06/2017 DOSE topical 250 unit/gram ointment - ointment topical applied nickel thick to the sacral wound daily [name], [name]. ([id number]) Notes I'm going to recommend that we continue with the [name] for the next week. I will send in a refill of that today for them. In the interim we will see were things stand in one weeks time as opposed to as I feel it is likely going to progress to the point of no longer need the [name] between now and then. If anything worsens in the interim patient will come back sooner or her daughter contact us for additional recommendations. Otherwise we will see her in one week. Electronic Signature(s) Signed: 05/06/2017 1:44:13 PM By: Lenda Kelp PA-C Entered By: Lenda Kelp on 05/06/2017 13:44:13 [name] ([id number]) -------------------------------------------------------------------------------- Problem List Details Patient Name: [name] Date of Service: 05/06/2017 11:00 AM Medical Record Number: [id number] Patient Account Number: [id number] Date of Birth/Sex: 1923/06/18 (83 y.o. Female) Treating RN: Curtis Sites Primary Care Provider: Vonita Moss Other Clinician: Referring Provider: Vonita Moss Treating Provider/Extender: Linwood Dibbles, HOYT Weeks in Treatment: 17 Active Problems ICD-10 Encounter Code Description Active Date Diagnosis L89.153 Pressure ulcer of sacral  region, stage 3 01/07/2017 Yes G20 Parkinson's disease 01/07/2017 Yes Inactive Problems Resolved Problems Electronic Signature(s) Signed: 05/06/2017 6:07:57 PM By: Lenda Kelp PA-C Entered By: Lenda Kelp on 05/06/2017 11:46:19 [name] ([id number]) -------------------------------------------------------------------------------- Progress Note Details Patient Name: [name] Date of Service:  05/06/2017 11:00 AM Medical Record Number: [id number] Patient Account Number: [id number] Date of Birth/Sex: July 02, 1923 (83 y.o. Female) Treating RN: Curtis Sites Primary Care Provider: Vonita Moss Other Clinician: Referring Provider: Vonita Moss Treating Provider/Extender: Linwood Dibbles, HOYT Weeks in Treatment: 17 Subjective Chief Complaint Information obtained from Patient 01/07/17; patient is here for review of a sacral pressure ulcer History of Present Illness (HPI) 01/07/17 this is a 83 year old woman admitted to the clinic today for review of a pressure ulcer on her lower sacrum. She is referred from her primary physician's office after being seen on 3/22 with a 3 cm pressure area. Her daughter and caretaker accompanied her today state that the area first became obvious about a month ago and his since deteriorated. They have recently got [name] a home health involved and have been using [name] to the wound. They have ordered a pressure relief surface for her mattress. They are turning her religiously. They state that she eats well and they've been forcing fluids on her. She is on a multivitamin. Looking through [facility] point last albumin I see was 4.4 on 10/28. The patient has advanced parkinsonism which looks superficially like advanced Parkinson's disease although her daughter tells me she did not ever respond to Sinemet therefore this may have another pathology with signs of parkinsonism. However I think this is largely a mute point currently. She also has dementia  and is nonambulatory. Since this started they have been keeping her in bed and turning her religiously every 2 hours. She lives at home in [place] with her husband with 24/7 care giving 01/13/17 santyl change qd. Still will require further debridement. continue santyl. 01/20/17; patient's wound actually looks some better less adherent necrotic surface. There is actually visible granulation. We're using [name] 01/27/17; better-looking surface but still a lot of necrotic tissue on the base of this wound. The periwound erythema is better than last week we are still using [name]. Her daughter tells [place] that she is still having trouble with the pressure-relief mattress through medical modalities 02/03/18; I had the patient scheduled for a two week followup however her daughter brought her in early concerned for discoloration on 2 areas of the wound circumference. We have bee using santyl 02/10/17;Better looking surface to the wound. Rim appears better suggesting better offloading. Using santyl 02/24/17; change to Silver Collegen last time. Wound appears better. 03/10/17; still using silver collagen religious offloading. Intake is satisfactory per her daughter. Dimension slightly better 03/12/2017 -- Dr. Jannetta Quint patient who had been seen 2 days ago and was doing fairly well. The patient is brought in by her daughter who noticed a new wound just above the previous wound on her sacral area and going on more to the left lateral side. She was very concerned and we asked her to get in for an opinion. 03/17/17; above is noted. The patient has developed a progressive area to the left of her original wound. This seems to this started with a ring of red skin with a more pale interior almost looking fungal. There was a rim of blister through part of the area although this did not look like zoster. They have been applying triamcinolone that was prescribed last week by Dr. Meyer Russel and the area has a fold to a linear band  area which [name], [name]. ([id number]) is confluent, erythematous and with obvious epidermal swelling but there is no overt tenderness or crepitus. She has lost some surface epithelium closer to the wound surface itself and now has a  more superficial wound in this area and the extending erythema goes towards the left buttock. This is well demarcated between involved in normal skin but once again does not appear to be at all tender. If there is a contact issue here I cannot get the history out of the daughter or the caregiver that are with her. 03/24/17; the patient arrives today with the wound slightly worse slightly more drainage. The bandlike area of erythema that I treated as a possible pineal infection has improved somewhat although proximally is still has confluent erythema without overt tenderness. We have been using silver alginate since the most recent deterioration. To the bandlike degree of erythema we have been using Lotrisone cream 03/31/17; patient arrives today with the wound slightly larger, necrotic surface and surrounding erythema. The bandlike area of erythema that I treated as a possible pineal infection is less swollen but still present I've been using Lotrisone cream on that largely related to the presence of a tinea looking infection when this was first seen. We've been using silver alginate. X-ray that I ordered last week showed no acute bony abnormalities mild fecal impaction. Lab work showed a comprehensive metabolic panel that was normal including an albumin of 3.8. White count was 9.5 hemoglobin 12.3 differential count normal. C-reactive protein was less than 1 and sedimentation rate and 17. The latter 2 values does not support an ongoing bacterial infection. 04/14/17; patient arrives after a 2 week hiatus. Her wound is not in good condition. Although the base of the wound looks stable she still has an erythematous area that was apparently blistered over the weekend.  This again points to the left. As our intake nurse pointed out today this is in the area where the tissue folds together and we may need to prevent try to prevent this. Lab work and x-ray that I did to 3 weeks ago were unremarkable including her albumin nevertheless she is an extremely frail condition physically. We have have been using [name] 04/22/17; I changed her to silver alginate because of the surrounding maceration and moisture last week. The daughter did not like the way the wound looked in the middle of the week and changed her back to [place]. They're putting gauze on top of this. Thinks still using Lotrisone. 05/06/17 on evaluation today patient sacral wound appears to be doing okay and does not seem to be any worse. She is having no significant pain during evaluation today the secondary to mental status she is unable to rate or describe whether she had any pain she was not however flinching. Her daughter states that the wound does appear to be looking better to her. Still we are having difficulty with the skinfold that seems to be closing in on itself at this point. All in all I feel like she is making some good progress in the [name] seems to be the official for her. They do tell me that a refill if we are gonna continue that today. No fevers, chills, nausea, or vomiting noted at this time. Objective Constitutional Chronically ill appearing but in no apparent acute distress. Vitals Time Taken: 11:19 AM, Temperature: 97.7 F, Pulse: 58 bpm, Respiratory Rate: 14 breaths/min. Tina MainlandSHAW, [name] ([id number]) Respiratory normal breathing without difficulty. clear to auscultation bilaterally. Psychiatric Patient is not able to cooperate in decision making regarding care. Patient has dementia. patient is confused. General Notes: This wound is slough covered on evaluation today. This did require sharp debridement which was tolerated well without any evidence of pain  as patient was  flinching otherwise indicating that she was hurting. I was able to remove a significant amount of the slough although there were some still remaining. Integumentary (Hair, Skin) Wound #1 status is Open. Original cause of wound was Pressure Injury. The wound is located on the Midline Coccyx. The wound measures 4cm length x 3.1cm width x 1.2cm depth; 9.739cm^2 area and 11.687cm^3 volume. There is Fat Layer (Subcutaneous Tissue) Exposed exposed. There is no tunneling or undermining noted. There is a large amount of serous drainage noted. The wound margin is flat and intact. There is small (1-33%) pink granulation within the wound bed. There is a large (67-100%) amount of necrotic tissue within the wound bed including Eschar and Adherent Slough. The periwound skin appearance exhibited: Induration, Erythema. The periwound skin appearance did not exhibit: Callus, Crepitus, Excoriation, Rash, Scarring, Dry/Scaly, Maceration, Atrophie Blanche, Cyanosis, Ecchymosis, Hemosiderin Staining, Mottled, Pallor, Rubor. The surrounding wound skin color is noted with erythema which is circumferential. Periwound temperature was noted as No Abnormality. The periwound has tenderness on palpation. Assessment Active Problems ICD-10 L89.153 - Pressure ulcer of sacral region, stage 3 G20 - Parkinson's disease Procedures Wound #1 Pre-procedure diagnosis of Wound #1 is a Pressure Ulcer located on the Midline Coccyx . There was a Skin/Subcutaneous Tissue Debridement ([id number]) debridement with total area of 12.4 sq cm performed by STONE III, HOYT E., PA-C. with the following instrument(s): Curette to remove Viable and Non-Viable tissue/material including Fibrin/Slough and Subcutaneous after achieving pain control using [name], [name]. ([id number]) Lidocaine 4% Topical Solution. A time out was conducted at 11:54, prior to the start of the procedure. A Minimum amount of bleeding was controlled with Pressure. The  procedure was tolerated well with a pain level of 0 throughout and a pain level of 0 following the procedure. Post Debridement Measurements: 4cm length x 3.1cm width x 1.3cm depth; 12.661cm^3 volume. Post debridement Stage noted as Category/Stage III. Character of Wound/Ulcer Post Debridement is improved. Post procedure Diagnosis Wound #1: Same as Pre-Procedure Plan Wound Cleansing: Wound #1 Midline Coccyx: Clean wound with Normal Saline. May Shower, gently pat wound dry prior to applying new dressing. Anesthetic: Wound #1 Midline Coccyx: Topical Lidocaine 4% cream applied to wound bed prior to debridement - in clinic Skin Barriers/Peri-Wound Care: Wound #1 Midline Coccyx: Skin Prep Primary Wound Dressing: Wound #1 Midline Coccyx: [name] Ointment Secondary Dressing: Wound #1 Midline Coccyx: Dry Gauze Boardered Foam Dressing - silicone bordered dressing due to skin breakdown Dressing Change Frequency: Wound #1 Midline Coccyx: Change dressing every day. Follow-up Appointments: Wound #1 Midline Coccyx: Return Appointment in 1 week. Off-Loading: Wound #1 Midline Coccyx: Roho cushion for wheelchair - HHRN please order roho cushion or gel cushion for patients wheelchair - whichever she qualifies for Turn and reposition every 2 hours Additional Orders / Instructions: Wound #1 Midline Coccyx: Increase protein intake. - please add protein supplements to patients diet Other: - please add vitamin A, vitamin C and zinc supplements to patients diet Home Health: Wound #1 Midline Coccyx: [facility] [name], [name] ([id number]) Home Health Nurse may visit PRN to address patient s wound care needs. FACE TO FACE ENCOUNTER: MEDICARE and MEDICAID PATIENTS: I certify that this patient is under my care and that I had a face-to-face encounter that meets the physician face-to-face encounter requirements with this patient on this date. The encounter with the  patient was in whole or in part for the following MEDICAL CONDITION: (primary reason for Home Healthcare)  MEDICAL NECESSITY: I certify, that based on my findings, NURSING services are a medically necessary home health service. HOME BOUND STATUS: I certify that my clinical findings support that this patient is homebound (i.e., Due to illness or injury, pt requires aid of supportive devices such as crutches, cane, wheelchairs, walkers, the use of special transportation or the assistance of another person to leave their place of residence. There is a normal inability to leave the home and doing so requires considerable and taxing effort. Other absences are for medical reasons / religious services and are infrequent or of short duration when for other reasons). If current dressing causes regression in wound condition, may D/C ordered dressing product/s and apply Normal Saline Moist Dressing daily until next Wound Healing Center / Other MD appointment. Notify Wound Healing Center of regression in wound condition at [phone]. Please direct any NON-WOUND related issues/requests for orders to patient's Primary Care Physician - Dr Vonita Moss Medications-please add to medication list.: Wound #1 Midline Coccyx: Other: - lotrisone cream The following medication(s) was prescribed: [name] topical 250 unit/gram ointment ointment topical applied nickel thick to the sacral wound daily starting 05/06/2017 General Notes: I'm going to recommend that we continue with the [name] for the next week. I will send in a refill of that today for them. In the interim we will see were things stand in one weeks time as opposed to as I feel it is likely going to progress to the point of no longer need the [name] between now and then. If anything worsens in the interim patient will come back sooner or her daughter contact us for additional recommendations. Otherwise we will see her in one week. Electronic  Signature(s) Signed: 05/06/2017 6:07:57 PM By: Lenda Kelp PA-C Entered By: Lenda Kelp on 05/06/2017 13:44:21 [name] ([id number]) -------------------------------------------------------------------------------- SuperBill Details Patient Name: [name] Date of Service: 05/06/2017 Medical Record Number: [id number] Patient Account Number: [id number] Date of Birth/Sex: 11-18-1922 (83 y.o. Female) Treating RN: Curtis Sites Primary Care Provider: Vonita Moss Other Clinician: Referring Provider: Vonita Moss Treating Provider/Extender: STONE III, HOYT Weeks in Treatment: 17 Diagnosis Coding ICD-10 Codes Code Description L89.153 Pressure ulcer of sacral region, stage 3 G20 Parkinson's disease Facility Procedures CPT4 Code: [id number] Description: 11042 - DEB SUBQ TISSUE 20 SQ CM/< ICD-10 Description Diagnosis L89.153 Pressure ulcer of sacral region, stage 3 Modifier: Quantity: 1 Physician Procedures CPT4 Code: [id number] Description: 11042 - WC PHYS SUBQ TISS 20 SQ CM ICD-10 Description Diagnosis L89.153 Pressure ulcer of sacral region, stage 3 Modifier: Quantity: 1 Electronic Signature(s) Signed: 05/06/2017 6:07:57 PM By: Lenda Kelp PA-C Entered By: Lenda Kelp on 05/06/2017 13:44:33

## 2017-05-13 ENCOUNTER — Encounter: Payer: Medicare Other | Admitting: Physician Assistant

## 2017-05-13 DIAGNOSIS — L89153 Pressure ulcer of sacral region, stage 3: Secondary | ICD-10-CM | POA: Diagnosis not present

## 2017-05-15 NOTE — Progress Notes (Signed)
Tina, [name] ([id number]) Visit Report for 05/13/2017 Arrival Information Details Patient Name: [name], [name] Date of Service: 05/13/2017 9:00 AM Medical Record Number: [id number] Patient Account Number: [id number] Date of Birth/Sex: 08-04-1923 (83 y.o. Female) Treating RN: Afful, RN, BSN, [employer] Primary Care Sian Rockers: Vonita Moss Other Clinician: Referring Shannen Flansburg: Vonita Moss Treating Latravious Levitt/Extender: Linwood Dibbles, HOYT Weeks in Treatment: 18 Visit Information History Since Last Visit All ordered tests and consults were No Patient Arrived: Wheel Chair completed: Arrival Time: 09:00 Added or deleted any medications: No Accompanied By: dtr/crgvr Any new allergies or adverse No Transfer Assistance: EasyPivot reactions: Patient Lift Had a fall or experienced change in No Patient Identification Verified: Yes activities of daily living that may Secondary Verification Process Yes affect Completed: risk of falls: Patient Requires Transmission- No Signs or symptoms of abuse/neglect No Based Precautions: since last visito Patient Has Alerts: No Hospitalized since last visit: No Has Dressing in Place as Yes Prescribed: Pain Present Now: Unable to Respond Electronic Signature(s) Signed: 05/13/2017 12:56:07 PM By: Elpidio Eric BSN, RN Entered By: Elpidio Eric on 05/13/2017 09:00:55 [name] ([id number]) -------------------------------------------------------------------------------- Clinic Level of Care Assessment Details Patient Name: [name] Date of Service: 05/13/2017 9:00 AM Medical Record Number: [id number] Patient Account Number: [id number] Date of Birth/Sex: 1922-11-11 (83 y.o. Female) Treating RN: Afful, RN, BSN, [employer] Primary Care Madalyne Husk: Vonita Moss Other Clinician: Referring Wiley Magan: Vonita Moss Treating Mildred Bollard/Extender: Linwood Dibbles, HOYT Weeks in Treatment: 18 Clinic Level of Care Assessment Items TOOL 4 Quantity Score []  - Use  when only an EandM is performed on FOLLOW-UP visit 0 ASSESSMENTS - Nursing Assessment / Reassessment []  - Reassessment of Co-morbidities (includes updates in patient status) 0 X - Reassessment of Adherence to Treatment Plan 1 5 ASSESSMENTS - Wound and Skin Assessment / Reassessment X - Simple Wound Assessment / Reassessment - one wound 1 5 []  - Complex Wound Assessment / Reassessment - multiple wounds 0 []  - Dermatologic / Skin Assessment (not related to wound area) 0 ASSESSMENTS - Focused Assessment []  - Circumferential Edema Measurements - multi extremities 0 []  - Nutritional Assessment / Counseling / Intervention 0 []  - Lower Extremity Assessment (monofilament, tuning fork, pulses) 0 []  - Peripheral Arterial Disease Assessment (using hand held doppler) 0 ASSESSMENTS - Ostomy and/or Continence Assessment and Care X - Incontinence Assessment and Management 1 10 []  - Ostomy Care Assessment and Management (repouching, etc.) 0 PROCESS - Coordination of Care X - Simple Patient / Family Education for ongoing care 1 15 []  - Complex (extensive) Patient / Family Education for ongoing care 0 []  - Staff obtains Chiropractor, Records, Test Results / Process Orders 0 X - Staff telephones HHA, Nursing Homes / Clarify orders / etc 1 10 []  - Routine Transfer to another Facility (non-emergent condition) 0 BERNETTE, SEEMAN. ([id number]) []  - Routine Hospital Admission (non-emergent condition) 0 []  - New Admissions / Manufacturing engineer / Ordering NPWT, Apligraf, etc. 0 []  - Emergency Hospital Admission (emergent condition) 0 []  - Simple Discharge Coordination 0 []  - Complex (extensive) Discharge Coordination 0 PROCESS - Special Needs []  - Pediatric / Minor Patient Management 0 []  - Isolation Patient Management 0 []  - Hearing / Language / Visual special needs 0 []  - Assessment of Community assistance (transportation, D/C planning, etc.) 0 []  - Additional assistance / Altered mentation 0 []  - Support  Surface(s) Assessment (bed, cushion, seat, etc.) 0 INTERVENTIONS - Wound Cleansing / Measurement X - Simple Wound Cleansing - one wound 1 5 []  -  Complex Wound Cleansing - multiple wounds 0 X - Wound Imaging (photographs - any number of wounds) 1 5 []  - Wound Tracing (instead of photographs) 0 X - Simple Wound Measurement - one wound 1 5 []  - Complex Wound Measurement - multiple wounds 0 INTERVENTIONS - Wound Dressings X - Small Wound Dressing one or multiple wounds 1 10 []  - Medium Wound Dressing one or multiple wounds 0 []  - Large Wound Dressing one or multiple wounds 0 []  - Application of Medications - topical 0 []  - Application of Medications - injection 0 INTERVENTIONS - Miscellaneous []  - External ear exam 0 VARONICA, BRADBURN. ([id number]) []  - Specimen Collection (cultures, biopsies, blood, body fluids, etc.) 0 []  - Specimen(s) / Culture(s) sent or taken to Lab for analysis 0 []  - Patient Transfer (multiple staff / Michiel Sites Lift / Similar devices) 0 []  - Simple Staple / Suture removal (25 or less) 0 []  - Complex Staple / Suture removal (26 or more) 0 []  - Hypo / Hyperglycemic Management (close monitor of Blood Glucose) 0 []  - Ankle / Brachial Index (ABI) - do not check if billed separately 0 X - Vital Signs 1 5 Has the patient been seen at the hospital within the last three years: Yes Total Score: 75 Level Of Care: New/Established - Level 2 Electronic Signature(s) Signed: 05/13/2017 12:56:07 PM By: Elpidio Eric BSN, RN Entered By: Elpidio Eric on 05/13/2017 09:41:01 [name] ([id number]) -------------------------------------------------------------------------------- Encounter Discharge Information Details Patient Name: [name] Date of Service: 05/13/2017 9:00 AM Medical Record Number: [id number] Patient Account Number: [id number] Date of Birth/Sex: April 09, 1923 (83 y.o. Female) Treating RN: Afful, RN, BSN, [employer] Primary Care Malonie Tatum: Vonita Moss Other  Clinician: Referring Dylana : Vonita Moss Treating Clayborn Milnes/Extender: Linwood Dibbles, HOYT Weeks in Treatment: 18 Encounter Discharge Information Items Discharge Pain Level: 0 Discharge Condition: Stable Ambulatory Status: Wheelchair Discharge Destination: Home Transportation: Other Accompanied By: dtr/crg Schedule Follow-up Appointment: No Medication Reconciliation completed and provided to Patient/Care No Lindy Garczynski: Provided on Clinical Summary of Care: 05/13/2017 Form Type Recipient Paper Patient ES Electronic Signature(s) Signed: 05/14/2017 10:14:57 AM By: Gwenlyn Perking Entered By: Gwenlyn Perking on 05/13/2017 09:54:11 [name] ([id number]) -------------------------------------------------------------------------------- Lower Extremity Assessment Details Patient Name: [name] Date of Service: 05/13/2017 9:00 AM Medical Record Number: [id number] Patient Account Number: [id number] Date of Birth/Sex: 1923-06-10 (83 y.o. Female) Treating RN: Afful, RN, BSN, [employer] Primary Care Maryuri Warnke: Vonita Moss Other Clinician: Referring Leialoha Hanna: Vonita Moss Treating August Gosser/Extender: Linwood Dibbles, HOYT Weeks in Treatment: 18 Electronic Signature(s) Signed: 05/13/2017 12:56:07 PM By: Elpidio Eric BSN, RN Entered By: Elpidio Eric on 05/13/2017 09:36:45 [name] ([id number]) -------------------------------------------------------------------------------- Multi Wound Chart Details Patient Name: [name] Date of Service: 05/13/2017 9:00 AM Medical Record Number: [id number] Patient Account Number: [id number] Date of Birth/Sex: [birth year] (83 y.o. Female) Treating RN: Clover Mealy, RN, BSN, [employer] Primary Care Kyrin Gratz: Vonita Moss Other Clinician: Referring Tmya Wigington: Vonita Moss Treating Eland Lamantia/Extender: Linwood Dibbles, HOYT Weeks in Treatment: 18 Vital Signs Height(in): Pulse(bpm): 63 Weight(lbs): Blood Pressure 154/104 (mmHg): Body Mass  Index(BMI): Temperature(F): 98.1 Respiratory Rate 16 (breaths/min): Photos: [1:No Photos] [N/A:N/A] Wound Location: [1:Coccyx - Midline] [N/A:N/A] Wounding Event: [1:Pressure Injury] [N/A:N/A] Primary Etiology: [1:Pressure Ulcer] [N/A:N/A] Comorbid History: [1:Anemia, Hypertension, Dementia] [N/A:N/A] Date Acquired: [1:12/01/2016] [N/A:N/A] Weeks of Treatment: [1:18] [N/A:N/A] Wound Status: [1:Open] [N/A:N/A] Measurements L x W x D 5x4x2 [N/A:N/A] (cm) Area (cm) : [1:15.708] [N/A:N/A] Volume (cm) : [1:31.416] [N/A:N/A] % Reduction in Area: [1:-278.10%] [N/A:N/A] % Reduction in Volume: -1790.30% [N/A:N/A]  Classification: [1:Category/Stage III] [N/A:N/A] Exudate Amount: [1:Large] [N/A:N/A] Exudate Type: [1:Serous] [N/A:N/A] Exudate Color: [1:amber] [N/A:N/A] Wound Margin: [1:Flat and Intact] [N/A:N/A] Granulation Amount: [1:None Present (0%)] [N/A:N/A] Necrotic Amount: [1:Large (67-100%)] [N/A:N/A] Necrotic Tissue: [1:Eschar, Adherent Slough] [N/A:N/A] Exposed Structures: [1:Fat Layer (Subcutaneous Tissue) Exposed: Yes Fascia: No Tendon: No Muscle: No Joint: No Bone: No] [N/A:N/A] Epithelialization: None N/A N/A Periwound Skin Texture: Induration: Yes N/A N/A Excoriation: No Callus: No Crepitus: No Rash: No Scarring: No Periwound Skin Maceration: No N/A N/A Moisture: Dry/Scaly: No Periwound Skin Color: Erythema: Yes N/A N/A Atrophie Blanche: No Cyanosis: No Ecchymosis: No Hemosiderin Staining: No Mottled: No Pallor: No Rubor: No Erythema Location: Circumferential N/A N/A Temperature: No Abnormality N/A N/A Tenderness on Yes N/A N/A Palpation: Wound Preparation: Ulcer Cleansing: N/A N/A Rinsed/Irrigated with Saline Topical Anesthetic Applied: Other: lidocaine 4% Treatment Notes Electronic Signature(s) Signed: 05/13/2017 12:56:07 PM By: Elpidio Eric BSN, RN Entered By: Elpidio Eric on 05/13/2017 09:38:54 [name]  ([id number]) -------------------------------------------------------------------------------- Multi-Disciplinary Care Plan Details Patient Name: [name] Date of Service: 05/13/2017 9:00 AM Medical Record Number: [id number] Patient Account Number: [id number] Date of Birth/Sex: July 01, 1923 (83 y.o. Female) Treating RN: Afful, RN, BSN, [employer] Primary Care Yajayra Feldt: Vonita Moss Other Clinician: Referring Shaleah Nissley: Vonita Moss Treating Shelden Raborn/Extender: Linwood Dibbles, HOYT Weeks in Treatment: 18 Active Inactive ` Abuse / Safety / Falls / Self Care Management Nursing Diagnoses: Impaired physical mobility Potential for falls Goals: Patient will remain injury free Date Initiated: 01/07/2017 Target Resolution Date: 04/03/2017 Goal Status: Active Interventions: Assess fall risk on admission and as needed Notes: ` Nutrition Nursing Diagnoses: Potential for alteratiion in Nutrition/Potential for imbalanced nutrition Goals: Patient/caregiver agrees to and verbalizes understanding of need to use nutritional supplements and/or vitamins as prescribed Date Initiated: 01/07/2017 Target Resolution Date: 04/03/2017 Goal Status: Active Interventions: Assess patient nutrition upon admission and as needed per policy Notes: ` Orientation to the Wound Care Program Nursing Diagnoses: CHANDY, TARMAN ([id number]) Knowledge deficit related to the wound healing center program Goals: Patient/caregiver will verbalize understanding of the Wound Healing Center Program Date Initiated: 01/07/2017 Target Resolution Date: 04/03/2017 Goal Status: Active Interventions: Provide education on orientation to the wound center Notes: ` Pressure Nursing Diagnoses: Knowledge deficit related to causes and risk factors for pressure ulcer development Goals: Patient will remain free from development of additional pressure ulcers Date Initiated: 01/07/2017 Target Resolution Date: 04/03/2017 Goal Status:  Active Interventions: Assess potential for pressure ulcer upon admission and as needed Notes: ` Wound/Skin Impairment Nursing Diagnoses: Impaired tissue integrity Goals: Patient/caregiver will verbalize understanding of skin care regimen Date Initiated: 01/07/2017 Target Resolution Date: 04/03/2017 Goal Status: Active Ulcer/skin breakdown will have a volume reduction of 30% by week 4 Date Initiated: 01/07/2017 Target Resolution Date: 04/03/2017 Goal Status: Active Ulcer/skin breakdown will have a volume reduction of 50% by week 8 Date Initiated: 01/07/2017 Target Resolution Date: 04/03/2017 Goal Status: Active Ulcer/skin breakdown will have a volume reduction of 80% by week 12 Date Initiated: 01/07/2017 Target Resolution Date: 04/03/2017 Goal Status: Active ZULAY, CORRIE ([id number]) Ulcer/skin breakdown will heal within 14 weeks Date Initiated: 01/07/2017 Target Resolution Date: 04/03/2017 Goal Status: Active Interventions: Assess patient/caregiver ability to obtain necessary supplies Assess patient/caregiver ability to perform ulcer/skin care regimen upon admission and as needed Assess ulceration(s) every visit Notes: Electronic Signature(s) Signed: 05/13/2017 12:56:07 PM By: Elpidio Eric BSN, RN Entered By: Elpidio Eric on 05/13/2017 09:38:43 [name] ([id number]) -------------------------------------------------------------------------------- Pain Assessment Details Patient Name: [name] Date of Service: 05/13/2017 9:00  AM Medical Record Number: [id number] Patient Account Number: [id number] Date of Birth/Sex: 1923-08-07 (83 y.o. Female) Treating RN: Afful, RN, BSN, [employer] Primary Care Joanell Cressler: Vonita Moss Other Clinician: Referring Ladine Kiper: Vonita Moss Treating Efe Fazzino/Extender: Linwood Dibbles, HOYT Weeks in Treatment: 18 Active Problems Location of Pain Severity and Description of Pain Patient Has Paino Patient Unable to Respond Site Locations Pain  Management and Medication Current Pain Management: Electronic Signature(s) Signed: 05/13/2017 12:56:07 PM By: Elpidio Eric BSN, RN Entered By: Elpidio Eric on 05/13/2017 09:01:03 [name] ([id number]) -------------------------------------------------------------------------------- Patient/Caregiver Education Details Patient Name: [name] Date of Service: 05/13/2017 9:00 AM Medical Record Number: [id number] Patient Account Number: [id number] Date of Birth/Gender: Feb 28, 1923 (83 y.o. Female) Treating RN: Clover Mealy, RN, BSN, [employer] Primary Care Physician: Vonita Moss Other Clinician: Referring Physician: Vonita Moss Treating Physician/Extender: Skeet Simmer in Treatment: 18 Education Assessment Education Provided To: Patient and Caregiver dtr/crg Education Topics Provided Offloading: Methods: Explain/Verbal Responses: Reinforcements needed, State content correctly Wound/Skin Impairment: Methods: Explain/Verbal Responses: Reinforcements needed Electronic Signature(s) Signed: 05/13/2017 12:56:07 PM By: Elpidio Eric BSN, RN Entered By: Elpidio Eric on 05/13/2017 09:56:51 [name] ([id number]) -------------------------------------------------------------------------------- Wound Assessment Details Patient Name: [name] Date of Service: 05/13/2017 9:00 AM Medical Record Number: [id number] Patient Account Number: [id number] Date of Birth/Sex: 1923/06/16 (83 y.o. Female) Treating RN: Afful, RN, BSN, [employer] Primary Care Branon Sabine: Vonita Moss Other Clinician: Referring Ersie Savino: Vonita Moss Treating Renesmae Donahey/Extender: Linwood Dibbles, HOYT Weeks in Treatment: 18 Wound Status Wound Number: 1 Primary Etiology: Pressure Ulcer Wound Location: Coccyx - Midline Wound Status: Open Wounding Event: Pressure Injury Comorbid History: Anemia, Hypertension, Dementia Date Acquired: 12/01/2016 Weeks Of Treatment: 18 Clustered Wound: No Photos Photo Uploaded  By: Elliot Gurney, BSN, RN, CWS, Kim on 05/13/2017 13:58:23 Wound Measurements Length: (cm) 5 Width: (cm) 4 Depth: (cm) 2 Area: (cm) 15.708 Volume: (cm) 31.416 % Reduction in Area: -278.1% % Reduction in Volume: -1790.3% Epithelialization: None Tunneling: No Undermining: No Wound Description Classification: Category/Stage III Wound Margin: Flat and Intact Exudate Amount: Large Exudate Type: Serous Exudate Color: amber Foul Odor After Cleansing: No Slough/Fibrino Yes Wound Bed Granulation Amount: None Present (0%) Exposed Structure Necrotic Amount: Large (67-100%) Fascia Exposed: No Necrotic Quality: Eschar, Adherent Slough Fat Layer (Subcutaneous Tissue) Exposed: Yes Tendon Exposed: No ELLORIE, KINDALL. ([id number]) Muscle Exposed: No Joint Exposed: No Bone Exposed: No Periwound Skin Texture Texture Color No Abnormalities Noted: No No Abnormalities Noted: No Callus: No Atrophie Blanche: No Crepitus: No Cyanosis: No Excoriation: No Ecchymosis: No Induration: Yes Erythema: Yes Rash: No Erythema Location: Circumferential Scarring: No Hemosiderin Staining: No Mottled: No Moisture Pallor: No No Abnormalities Noted: No Rubor: No Dry / Scaly: No Maceration: No Temperature / Pain Temperature: No Abnormality Tenderness on Palpation: Yes Wound Preparation Ulcer Cleansing: Rinsed/Irrigated with Saline Topical Anesthetic Applied: Other: lidocaine 4%, Treatment Notes Wound #1 (Midline Coccyx) 1. Cleansed with: Clean wound with Normal Saline 4. Dressing Applied: Other dressing (specify in notes) 5. Secondary Dressing Applied Bordered Foam Dressing Dry Gauze Notes Dakins Solution Electronic Signature(s) Signed: 05/13/2017 12:56:07 PM By: Elpidio Eric BSN, RN Entered By: Elpidio Eric on 05/13/2017 09:36:33 [name] ([id number]) -------------------------------------------------------------------------------- Vitals Details Patient Name: [name] Date of Service: 05/13/2017 9:00 AM Medical Record Number: [id number] Patient Account Number: [id number] Date of Birth/Sex: Feb 27, 1923 (83 y.o. Female) Treating RN: Clover Mealy, RN, BSN, [employer] Primary Care Ionna Avis: Vonita Moss Other Clinician: Referring Hartlee Amedee: Vonita Moss Treating Jenese Mischke/Extender: STONE III, HOYT Weeks in Treatment: 18 Vital Signs Time Taken:  08:55 Temperature (F): 98.1 Pulse (bpm): 63 Respiratory Rate (breaths/min): 16 Blood Pressure (mmHg): 154/104 Reference Range: 80 - 120 mg / dl Electronic Signature(s) Signed: 05/13/2017 12:56:07 PM By: Elpidio Eric BSN, RN Entered By: Elpidio Eric on 05/13/2017 09:02:06

## 2017-05-15 NOTE — Progress Notes (Signed)
MYSTERY, SCHRUPP ([id number]) Visit Report for 05/13/2017 Chief Complaint Document Details Patient Name: [name], [name] Date of Service: 05/13/2017 9:00 AM Medical Record Number: [id number] Patient Account Number: [id number] Date of Birth/Sex: 10-25-1922 (83 y.o. Female) Treating RN: Clover Mealy, RN, BSN, [employer] Primary Care Provider: Vonita Moss Other Clinician: Referring Provider: Vonita Moss Treating Provider/Extender: Linwood Dibbles, HOYT Weeks in Treatment: 18 Information Obtained from: Patient Chief Complaint 01/07/17; patient is here for review of a sacral pressure ulcer Electronic Signature(s) Signed: 05/14/2017 12:37:32 AM By: Lenda Kelp PA-C Entered By: Lenda Kelp on 05/13/2017 09:46:51 [name] ([id number]) -------------------------------------------------------------------------------- HPI Details Patient Name: [name] Date of Service: 05/13/2017 9:00 AM Medical Record Number: [id number] Patient Account Number: [id number] Date of Birth/Sex: [birth year] (83 y.o. Female) Treating RN: Clover Mealy, RN, BSN, [employer] Primary Care Provider: Vonita Moss Other Clinician: Referring Provider: Vonita Moss Treating Provider/Extender: Linwood Dibbles, HOYT Weeks in Treatment: 18 History of Present Illness HPI Description: 01/07/17 this is a 83 year old woman admitted to the clinic today for review of a pressure ulcer on her lower sacrum. She is referred from her primary physician's office after being seen on 3/22 with a 3 cm pressure area. Her daughter and caretaker accompanied her today state that the area first became obvious about a month ago and his since deteriorated. They have recently got [name] a home health involved and have been using [name] to the wound. They have ordered a pressure relief surface for her mattress. They are turning her religiously. They state that she eats well and they've been forcing fluids on her. She is on a multivitamin. Looking through [facility] point last albumin I see was 4.4 on 10/28. The patient has advanced parkinsonism which looks superficially like advanced Parkinson's disease although her daughter tells me she did not ever respond to Sinemet therefore this may have another pathology with signs of parkinsonism. However I think this is largely a mute point currently. She also has dementia and is nonambulatory. Since this started they have been keeping her in bed and turning her religiously every 2 hours. She lives at home in [place] with her husband with 24/7 care giving 01/13/17 santyl change qd. Still will require further debridement. continue santyl. 01/20/17; patient's wound actually looks some better less adherent necrotic surface. There is actually visible granulation. We're using [name] 01/27/17; better-looking surface but still a lot of necrotic tissue on the base of this wound. The periwound erythema is better than last week we are still using [name]. Her daughter tells [place] that she is still having trouble with the pressure-relief mattress through medical modalities 02/03/18; I had the patient scheduled for a two week followup however her daughter brought her in early concerned for discoloration on 2 areas of the wound circumference. We have bee using santyl 02/10/17;Better looking surface to the wound. Rim appears better suggesting better offloading. Using santyl 02/24/17; change to Silver Collegen last time. Wound appears better. 03/10/17; still using silver collagen religious offloading. Intake is satisfactory per her daughter. Dimension slightly better 03/12/2017 -- Dr. Jannetta Quint patient who had been seen 2 days ago and was doing fairly well. The patient is brought in by her daughter who noticed a new wound just above the previous wound on her sacral area and going on more to the left lateral side. She was very concerned and we asked her to get in for an opinion. 03/17/17; above is noted. The patient has developed a  progressive area to the left of her  original wound. This seems to this started with a ring of red skin with a more pale interior almost looking fungal. There was a rim of blister through part of the area although this did not look like zoster. They have been applying triamcinolone that was prescribed last week by Dr. Meyer Russel and the area has a fold to a linear band area which is confluent, erythematous and with obvious epidermal swelling but there is no overt tenderness or crepitus. She has lost some surface epithelium closer to the wound surface itself and now has a more superficial wound in this area and the extending erythema goes towards the left buttock. This is well demarcated between involved in normal skin but once again does not appear to be at all tender. If there is a contact issue here I cannot get the history out of the daughter or the caregiver that are with her. 03/24/17; the patient arrives today with the wound slightly worse slightly more drainage. The bandlike area of [name], [name]. ([id number]) erythema that I treated as a possible pineal infection has improved somewhat although proximally is still has confluent erythema without overt tenderness. We have been using silver alginate since the most recent deterioration. To the bandlike degree of erythema we have been using Lotrisone cream 03/31/17; patient arrives today with the wound slightly larger, necrotic surface and surrounding erythema. The bandlike area of erythema that I treated as a possible pineal infection is less swollen but still present I've been using Lotrisone cream on that largely related to the presence of a tinea looking infection when this was first seen. We've been using silver alginate. X-ray that I ordered last week showed no acute bony abnormalities mild fecal impaction. Lab work showed a comprehensive metabolic panel that was normal including an albumin of 3.8. White count was 9.5 hemoglobin 12.3  differential count normal. C-reactive protein was less than 1 and sedimentation rate and 17. The latter 2 values does not support an ongoing bacterial infection. 04/14/17; patient arrives after a 2 week hiatus. Her wound is not in good condition. Although the base of the wound looks stable she still has an erythematous area that was apparently blistered over the weekend. This again points to the left. As our intake nurse pointed out today this is in the area where the tissue folds together and we may need to prevent try to prevent this. Lab work and x-ray that I did to 3 weeks ago were unremarkable including her albumin nevertheless she is an extremely frail condition physically. We have have been using [name] 04/22/17; I changed her to silver alginate because of the surrounding maceration and moisture last week. The daughter did not like the way the wound looked in the middle of the week and changed her back to [place]. They're putting gauze on top of this. Thinks still using Lotrisone. 05/06/17 on evaluation today patient sacral wound appears to be doing okay and does not seem to be any worse. She is having no significant pain during evaluation today the secondary to mental status she is unable to rate or describe whether she had any pain she was not however flinching. Her daughter states that the wound does appear to be looking better to her. Still we are having difficulty with the skinfold that seems to be closing in on itself at this point. All in all I feel like she is making some good progress in the [name] seems to be the official for her. They do tell me that  a refill if we are gonna continue that today. No fevers, chills, nausea, or vomiting noted at this time. 05/13/17 presents today for evaluation concerning her ongoing sacral pressure ulcer. Unfortunately she also has an area of deep tissue injury in the right Ischial region which is starting to show up. The sacral wound also continues to  show signs of necrotic tissue overlying and has declined. Overall we really have not seen a significant improvement in the past several months in regard to the sacral wound and now patient is starting to develop a new wound in the right Ischial region. Obviously this is not trending in the direction that we want to see. No fevers, chills, nausea, or vomiting noted at this time. Electronic Signature(s) Signed: 05/14/2017 12:37:32 AM By: Lenda Kelp PA-C Entered By: Lenda Kelp on 05/13/2017 09:48:57 [name] ([id number]) -------------------------------------------------------------------------------- Physical Exam Details Patient Name: [name] Date of Service: 05/13/2017 9:00 AM Medical Record Number: [id number] Patient Account Number: [id number] Date of Birth/Sex: Dec 12, 1922 (83 y.o. Female) Treating RN: Clover Mealy, RN, BSN, [employer] Primary Care Provider: Vonita Moss Other Clinician: Referring Provider: Vonita Moss Treating Provider/Extender: STONE III, HOYT Weeks in Treatment: 18 Constitutional Chronically ill appearing but in no apparent acute distress. Respiratory normal breathing without difficulty. Psychiatric Patient is not able to cooperate in decision making regarding care. Patient has dementia. patient is confused. Notes Patient sacral wound shows evidence of necrotic slough noted over the wound bed and there really is no improvement in her measurements today. There is erythema surrounding the wound but this does not appear to be infectious in nature but rather pressure injury although it is blanchable. I do not see any evidence of yeast infection at this point. The deep tissue injury of the right Ischial region does not appear to be closed opening right now although it definitely does need to be protected. Electronic Signature(s) Signed: 05/14/2017 12:37:32 AM By: Lenda Kelp PA-C Entered By: Lenda Kelp on 05/13/2017 09:49:43 [name]  ([id number]) -------------------------------------------------------------------------------- Physician Orders Details Patient Name: [name] Date of Service: 05/13/2017 9:00 AM Medical Record Number: [id number] Patient Account Number: [id number] Date of Birth/Sex: 01/13/1923 (83 y.o. Female) Treating RN: Afful, RN, BSN, [employer] Primary Care Provider: Vonita Moss Other Clinician: Referring Provider: Vonita Moss Treating Provider/Extender: Linwood Dibbles, HOYT Weeks in Treatment: 88 Verbal / Phone Orders: No Diagnosis Coding ICD-10 Coding Code Description L89.153 Pressure ulcer of sacral region, stage 3 G20 Parkinson's disease Wound Cleansing Wound #1 Midline Coccyx o Clean wound with Normal Saline. o May Shower, gently pat wound dry prior to applying new dressing. Anesthetic Wound #1 Midline Coccyx o Topical Lidocaine 4% cream applied to wound bed prior to debridement - in clinic Skin Barriers/Peri-Wound Care Wound #1 Midline Coccyx o Skin Prep Primary Wound Dressing Wound #1 Midline Coccyx o Other: - Dakins Secondary Dressing Wound #1 Midline Coccyx o Dry Gauze o Boardered Foam Dressing - silicone bordered dressing due to skin breakdown Dressing Change Frequency Wound #1 Midline Coccyx o Change dressing every day. Follow-up Appointments Wound #1 Midline Coccyx o Return Appointment in 1 week. [name], [name] ([id number]) Off-Loading Wound #1 Midline Coccyx o Roho cushion for wheelchair - HHRN please order roho cushion or gel cushion for patients wheelchair - whichever she qualifies for o Turn and reposition every 2 hours Additional Orders / Instructions Wound #1 Midline Coccyx o Increase protein intake. - please add protein supplements to patients diet o Other: - please add vitamin A,  vitamin C and zinc supplements to patients diet Home Health Wound #1 Midline Coccyx o Initiate Home Health for Skilled Nursing o Home Health Nurse  may visit PRN to address patientos wound care needs. o FACE TO FACE ENCOUNTER: MEDICARE and MEDICAID PATIENTS: I certify that this patient is under my care and that I had a face-to-face encounter that meets the physician face-to-face encounter requirements with this patient on this date. The encounter with the patient was in whole or in part for the following MEDICAL CONDITION: (primary reason for Home Healthcare) MEDICAL NECESSITY: I certify, that based on my findings, NURSING services are a medically necessary home health service. HOME BOUND STATUS: I certify that my clinical findings support that this patient is homebound (i.e., Due to illness or injury, pt requires aid of supportive devices such as crutches, cane, wheelchairs, walkers, the use of special transportation or the assistance of another person to leave their place of residence. There is a normal inability to leave the home and doing so requires considerable and taxing effort. Other absences are for medical reasons / religious services and are infrequent or of short duration when for other reasons). o If current dressing causes regression in wound condition, may D/C ordered dressing product/s and apply Normal Saline Moist Dressing daily until next Wound Healing Center / Other MD appointment. Notify Wound Healing Center of regression in wound condition at [phone]. o Please direct any NON-WOUND related issues/requests for orders to patient's Primary Care Physician - Dr Vonita Moss Medications-please add to medication list. Wound #1 Midline Coccyx o Other: - lotrisone cream Patient Medications Allergies: Phenergan, Sulfa (Sulfonamide Antibiotics) Notifications Medication Indication Start End Dakin's Solution 05/13/2017 DOSE miscellaneous 0.25 % solution - Gauze should be soaked into the Dakinos solution and then packed into patientos sacral wound daily then covered with a foam dressing [name], [name]  ([id number]) Electronic Signature(s) Signed: 05/13/2017 9:51:19 AM By: Lenda Kelp PA-C Entered By: Lenda Kelp on 05/13/2017 09:51:19 [name] ([id number]) -------------------------------------------------------------------------------- Prescription 05/13/2017 Patient Name: [name] Rx Reference #: Date of Birth: 1923-02-18 Provider: Lenda Kelp PA-C Sex: F NPI#: [id number] Phone #: [phone] DEA#: [id number] License #: Patient Address: [address] [facility] [place], [place] [address] [facility] [address] [place], [place] [address] [phone] Allergies Phenergan Sulfa (Sulfonamide Antibiotics) Medication Medication: Route: Strength: Form: Dakin's Solution miscellaneous 0.25 % solution Class: OXIDIZING AGENTS Dose: Frequency / Time: Indication: Gauze should be soaked into the Dakinos solution and then packed into patientos sacral wound daily then covered with a foam dressing Number of Refills: Number of Units: 1 One (1) Bottle(s) Generic Substitution: Start Date: End Date: Administered at Substitution Permitted 05/13/2017 Facility: No Note to Pharmacy: Signature(s): Date(s): [name], [name] ([id number]) Electronic Signature(s) Signed: 05/14/2017 12:37:32 AM By: Lenda Kelp PA-C Entered By: Lenda Kelp on 05/13/2017 09:51:20 [name] ([id number]) --------------------------------------------------------------------------------  Problem List Details Patient Name: [name] Date of Service: 05/13/2017 9:00 AM Medical Record Number: [id number] Patient Account Number: [id number] Date of Birth/Sex: 05-May-1923 (83 y.o. Female) Treating RN: Afful, RN, BSN, [employer] Primary Care Provider: Vonita Moss Other Clinician: Referring Provider: Vonita Moss Treating Provider/Extender: Linwood Dibbles, HOYT Weeks in Treatment: 18 Active  Problems ICD-10 Encounter Code Description Active Date Diagnosis L89.153 Pressure ulcer of sacral region, stage 3 01/07/2017 Yes G20 Parkinson's disease 01/07/2017 Yes Inactive Problems Resolved Problems Electronic Signature(s) Signed: 05/14/2017 12:37:32 AM By: Lenda Kelp PA-C  Entered By: Lenda Kelp on 05/13/2017 09:33:15 [name] ([id number]) -------------------------------------------------------------------------------- Progress Note Details Patient Name: [name] Date of Service: 05/13/2017 9:00 AM Medical Record Number: [id number] Patient Account Number: [id number] Date of Birth/Sex: [birth year] (83 y.o. Female) Treating RN: Afful, RN, BSN, [employer] Primary Care Provider: Vonita Moss Other Clinician: Referring Provider: Vonita Moss Treating Provider/Extender: Linwood Dibbles, HOYT Weeks in Treatment: 18 Subjective Chief Complaint Information obtained from Patient 01/07/17; patient is here for review of a sacral pressure ulcer History of Present Illness (HPI) 01/07/17 this is a 83 year old woman admitted to the clinic today for review of a pressure ulcer on her lower sacrum. She is referred from her primary physician's office after being seen on 3/22 with a 3 cm pressure area. Her daughter and caretaker accompanied her today state that the area first became obvious about a month ago and his since deteriorated. They have recently got [name] a home health involved and have been using [name] to the wound. They have ordered a pressure relief surface for her mattress. They are turning her religiously. They state that she eats well and they've been forcing fluids on her. She is on a multivitamin. Looking through [facility] point last albumin I see was 4.4 on 10/28. The patient has advanced parkinsonism which looks superficially like advanced Parkinson's disease although her daughter tells me she did not ever respond to Sinemet therefore this may have another  pathology with signs of parkinsonism. However I think this is largely a mute point currently. She also has dementia and is nonambulatory. Since this started they have been keeping her in bed and turning her religiously every 2 hours. She lives at home in [place] with her husband with 24/7 care giving 01/13/17 santyl change qd. Still will require further debridement. continue santyl. 01/20/17; patient's wound actually looks some better less adherent necrotic surface. There is actually visible granulation. We're using [name] 01/27/17; better-looking surface but still a lot of necrotic tissue on the base of this wound. The periwound erythema is better than last week we are still using [name]. Her daughter tells [place] that she is still having trouble with the pressure-relief mattress through medical modalities 02/03/18; I had the patient scheduled for a two week followup however her daughter brought her in early concerned for discoloration on 2 areas of the wound circumference. We have bee using santyl 02/10/17;Better looking surface to the wound. Rim appears better suggesting better offloading. Using santyl 02/24/17; change to Silver [place]le[place] last time. Wound appears better. 03/10/17; still using silver collagen religious offloading. Intake is satisfactory per her daughter. Dimension slightly better 03/12/2017 -- Dr. Jannetta Quint patient who had been seen 2 days ago and was doing fairly well. The patient is brought in by her daughter who noticed a new wound just above the previous wound on her sacral area and going on more to the left lateral side. She was very concerned and we asked her to get in for an opinion. 03/17/17; above is noted. The patient has developed a progressive area to the left of her original wound. This seems to this started with a ring of red skin with a more pale interior almost looking fungal. There was a rim of blister through part of the area although this did not look like zoster. They  have been applying triamcinolone that was prescribed last week by Dr. Meyer Russel and the area has a fold to a linear band area which [name], [name]. ([id number]) is confluent, erythematous and with obvious  epidermal swelling but there is no overt tenderness or crepitus. She has lost some surface epithelium closer to the wound surface itself and now has a more superficial wound in this area and the extending erythema goes towards the left buttock. This is well demarcated between involved in normal skin but once again does not appear to be at all tender. If there is a contact issue here I cannot get the history out of the daughter or the caregiver that are with her. 03/24/17; the patient arrives today with the wound slightly worse slightly more drainage. The bandlike area of erythema that I treated as a possible pineal infection has improved somewhat although proximally is still has confluent erythema without overt tenderness. We have been using silver alginate since the most recent deterioration. To the bandlike degree of erythema we have been using Lotrisone cream 03/31/17; patient arrives today with the wound slightly larger, necrotic surface and surrounding erythema. The bandlike area of erythema that I treated as a possible pineal infection is less swollen but still present I've been using Lotrisone cream on that largely related to the presence of a tinea looking infection when this was first seen. We've been using silver alginate. X-ray that I ordered last week showed no acute bony abnormalities mild fecal impaction. Lab work showed a comprehensive metabolic panel that was normal including an albumin of 3.8. White count was 9.5 hemoglobin 12.3 differential count normal. C-reactive protein was less than 1 and sedimentation rate and 17. The latter 2 values does not support an ongoing bacterial infection. 04/14/17; patient arrives after a 2 week hiatus. Her wound is not in good condition. Although the  base of the wound looks stable she still has an erythematous area that was apparently blistered over the weekend. This again points to the left. As our intake nurse pointed out today this is in the area where the tissue folds together and we may need to prevent try to prevent this. Lab work and x-ray that I did to 3 weeks ago were unremarkable including her albumin nevertheless she is an extremely frail condition physically. We have have been using [name] 04/22/17; I changed her to silver alginate because of the surrounding maceration and moisture last week. The daughter did not like the way the wound looked in the middle of the week and changed her back to [place]. They're putting gauze on top of this. Thinks still using Lotrisone. 05/06/17 on evaluation today patient sacral wound appears to be doing okay and does not seem to be any worse. She is having no significant pain during evaluation today the secondary to mental status she is unable to rate or describe whether she had any pain she was not however flinching. Her daughter states that the wound does appear to be looking better to her. Still we are having difficulty with the skinfold that seems to be closing in on itself at this point. All in all I feel like she is making some good progress in the [name] seems to be the official for her. They do tell me that a refill if we are gonna continue that today. No fevers, chills, nausea, or vomiting noted at this time. 05/13/17 presents today for evaluation concerning her ongoing sacral pressure ulcer. Unfortunately she also has an area of deep tissue injury in the right Ischial region which is starting to show up. The sacral wound also continues to show signs of necrotic tissue overlying and has declined. Overall we really have not seen a significant  improvement in the past several months in regard to the sacral wound and now patient is starting to develop a new wound in the right Ischial region.  Obviously this is not trending in the direction that we want to see. No fevers, chills, nausea, or vomiting noted at this time. [name], [name] ([id number]) Objective Constitutional Chronically ill appearing but in no apparent acute distress. Vitals Time Taken: 8:55 AM, Temperature: 98.1 F, Pulse: 63 bpm, Respiratory Rate: 16 breaths/min, Blood Pressure: 154/104 mmHg. Respiratory normal breathing without difficulty. Psychiatric Patient is not able to cooperate in decision making regarding care. Patient has dementia. patient is confused. General Notes: Patient sacral wound shows evidence of necrotic slough noted over the wound bed and there really is no improvement in her measurements today. There is erythema surrounding the wound but this does not appear to be infectious in nature but rather pressure injury although it is blanchable. I do not see any evidence of yeast infection at this point. The deep tissue injury of the right Ischial region does not appear to be closed opening right now although it definitely does need to be protected. Integumentary (Hair, Skin) Wound #1 status is Open. Original cause of wound was Pressure Injury. The wound is located on the Midline Coccyx. The wound measures 5cm length x 4cm width x 2cm depth; 15.708cm^2 area and 31.416cm^3 volume. There is Fat Layer (Subcutaneous Tissue) Exposed exposed. There is no tunneling or undermining noted. There is a large amount of serous drainage noted. The wound margin is flat and intact. There is no granulation within the wound bed. There is a large (67-100%) amount of necrotic tissue within the wound bed including Eschar and Adherent Slough. The periwound skin appearance exhibited: Induration, Erythema. The periwound skin appearance did not exhibit: Callus, Crepitus, Excoriation, Rash, Scarring, Dry/Scaly, Maceration, Atrophie Blanche, Cyanosis, Ecchymosis, Hemosiderin Staining, Mottled, Pallor, Rubor. The surrounding  wound skin color is noted with erythema which is circumferential. Periwound temperature was noted as No Abnormality. The periwound has tenderness on palpation. Assessment Active Problems ICD-10 L89.153 - Pressure ulcer of sacral region, stage 3 G20 - Parkinson's disease [name], [name]. ([id number]) Plan Wound Cleansing: Wound #1 Midline Coccyx: Clean wound with Normal Saline. May Shower, gently pat wound dry prior to applying new dressing. Anesthetic: Wound #1 Midline Coccyx: Topical Lidocaine 4% cream applied to wound bed prior to debridement - in clinic Skin Barriers/Peri-Wound Care: Wound #1 Midline Coccyx: Skin Prep Primary Wound Dressing: Wound #1 Midline Coccyx: Other: - Dakins Secondary Dressing: Wound #1 Midline Coccyx: Dry Gauze Boardered Foam Dressing - silicone bordered dressing due to skin breakdown Dressing Change Frequency: Wound #1 Midline Coccyx: Change dressing every day. Follow-up Appointments: Wound #1 Midline Coccyx: Return Appointment in 1 week. Off-Loading: Wound #1 Midline Coccyx: Roho cushion for wheelchair - HHRN please order roho cushion or gel cushion for patients wheelchair - whichever she qualifies for Turn and reposition every 2 hours Additional Orders / Instructions: Wound #1 Midline Coccyx: Increase protein intake. - please add protein supplements to patients diet Other: - please add vitamin A, vitamin C and zinc supplements to patients diet Home Health: Wound #1 Midline Coccyx: Initiate Home Health for Skilled Nursing Home Health Nurse may visit PRN to address patient s wound care needs. FACE TO FACE ENCOUNTER: MEDICARE and MEDICAID PATIENTS: I certify that this patient is under my care and that I had a face-to-face encounter that meets the physician face-to-face encounter requirements with this patient on this date. The encounter with  the patient was in whole or in part for the following MEDICAL CONDITION: (primary reason for Home  Healthcare) MEDICAL NECESSITY: I certify, that based on my findings, NURSING services are a medically necessary home health service. HOME BOUND STATUS: I certify that my clinical findings support that this patient is homebound (i.e., Due to illness or injury, pt requires aid of supportive devices such as crutches, cane, wheelchairs, walkers, the use of special transportation or the assistance of another person to leave their place of residence. There is a normal inability to leave the home and doing so requires considerable and taxing effort. Other absences are [name], [name] ([id number]) for medical reasons / religious services and are infrequent or of short duration when for other reasons). If current dressing causes regression in wound condition, may D/C ordered dressing product/s and apply Normal Saline Moist Dressing daily until next Wound Healing Center / Other MD appointment. Notify Wound Healing Center of regression in wound condition at [phone]. Please direct any NON-WOUND related issues/requests for orders to patient's Primary Care Physician - Dr Vonita Moss Medications-please add to medication list.: Wound #1 Midline Coccyx: Other: - lotrisone cream The following medication(s) was prescribed: Dakin's Solution miscellaneous 0.25 % solution Gauze should be soaked into the Dakin s solution and then packed into patient s sacral wound daily then covered with a foam dressing starting 05/13/2017 At this point I'm gonna recommend that we switch to quarter strength Dakin solution for gauze to be soaked in and then packed into the sacral wound per the above wound care orders. We will also continue to cover the right Ischial region to hopefully prevent this from breaking down. Nonetheless I'm hopeful that that will not. I do think that this patient will be a candidate for an air fluidized bed apparently they have spoken with the company regarding this as well. With that being said it  is currently in the review process as far as that is concerned. If anything worsens in the interim they will contact us for additional recommendations otherwise we will see her for reevaluation in one week. Electronic Signature(s) Signed: 05/14/2017 12:37:32 AM By: Lenda Kelp PA-C Entered By: Lenda Kelp on 05/13/2017 09:54:43 [name] ([id number]) -------------------------------------------------------------------------------- SuperBill Details Patient Name: [name] Date of Service: 05/13/2017 Medical Record Number: [id number] Patient Account Number: [id number] Date of Birth/Sex: 12-05-1922 (83 y.o. Female) Treating RN: Afful, RN, BSN, [employer] Primary Care Provider: Vonita Moss Other Clinician: Referring Provider: Vonita Moss Treating Provider/Extender: STONE III, HOYT Weeks in Treatment: 18 Diagnosis Coding ICD-10 Codes Code Description L89.153 Pressure ulcer of sacral region, stage 3 G20 Parkinson's disease Facility Procedures CPT4 Code: [id number] Description: [phone] - WOUND CARE VISIT-LEV 2 EST PT Modifier: Quantity: 1 Physician Procedures CPT4 Code: [id number] Description: 99213 - WC PHYS LEVEL 3 - EST PT ICD-10 Description Diagnosis L89.153 Pressure ulcer of sacral region, stage 3 G20 Parkinson's disease Modifier: Quantity: 1 Electronic Signature(s) Signed: 05/14/2017 12:37:32 AM By: Lenda Kelp PA-C Entered By: Lenda Kelp on 05/13/2017 09:55:02

## 2017-05-19 ENCOUNTER — Encounter: Payer: Medicare Other | Admitting: Internal Medicine

## 2017-05-19 DIAGNOSIS — L89153 Pressure ulcer of sacral region, stage 3: Secondary | ICD-10-CM | POA: Diagnosis not present

## 2017-05-20 ENCOUNTER — Other Ambulatory Visit
Admission: RE | Admit: 2017-05-20 | Discharge: 2017-05-20 | Disposition: A | Discharge status: Home / Self Care | Payer: Medicare Other | Source: Ambulatory Visit | Attending: Internal Medicine | Admitting: Internal Medicine

## 2017-05-20 DIAGNOSIS — L89154 Pressure ulcer of sacral region, stage 4: Secondary | ICD-10-CM | POA: Diagnosis present

## 2017-05-20 NOTE — Progress Notes (Signed)
[name] ([id number]) Visit Report for 05/19/2017 HPI Details Patient Name: [name], [name] Date of Service: 05/19/2017 3:30 PM Medical Record Patient Account Number: [id number] Number: Treating RN: [name] 1923-07-17 (83 y.o. Other Clinician: Date of Birth/Sex: Female) Treating [name] Primary Care Provider: Vonita [name] Provider/Extender: G Referring Provider: Vonita [name] Weeks in Treatment: 18 History of Present Illness HPI Description: 01/07/17 this is an 83 year old woman admitted to the clinic today for review of a pressure ulcer on her lower sacrum. She is referred from her primary physician's office after being seen on 3/22 with a 3 cm pressure area. Her daughter and caretaker accompanied her today state that the area first became obvious about a month ago and his since deteriorated. They have recently got [name] a home health involved and have been using [name] to the wound. They have ordered a pressure relief surface for her mattress. They are turning her religiously. They state that she eats well and they've been forcing fluids on her. She is on a multivitamin. Looking through [facility] point last albumin I see was 4.4 on 10/28. The patient has advanced parkinsonism which looks superficially like advanced Parkinson's disease although her daughter tells me she did not ever respond to Sinemet therefore this may have another pathology with signs of parkinsonism. However I think this is largely a mute point currently. She also has dementia and is nonambulatory. Since this started they have been keeping her in bed and turning her religiously every 2 hours. She lives at home in [place] with her husband with 24/7 care giving 01/13/17 santyl change qd. Still will require further debridement. continue santyl. 01/20/17; patient's wound actually looks some better less adherent necrotic surface. There is actually visible granulation. We're using  [name] 01/27/17; better-looking surface but still a lot of necrotic tissue on the base of this wound. The periwound erythema is better than last week we are still using [name]. Her daughter tells [place] that she is still having trouble with the pressure-relief mattress through medical modalities 02/03/18; I had the patient scheduled for a two week followup however her daughter brought her in early concerned for discoloration on 2 areas of the wound circumference. We have bee using santyl 02/10/17;Better looking surface to the wound. Rim appears better suggesting better offloading. Using santyl 02/24/17; change to Silver Collegen last time. Wound appears better. 03/10/17; still using silver collagen religious offloading. Intake is satisfactory per her daughter. Dimension slightly better 03/12/2017 -- Dr. Jannetta [name] patient who had been seen 2 days ago and was doing fairly well. The patient is brought in by her daughter who noticed a new wound just above the previous wound on her sacral area and going on more to the left lateral side. She was very concerned and we asked her to get in for an opinion. 03/17/17; above is noted. The patient has developed a progressive area to the left of her original wound. This seems to this started with a ring of red skin with a more pale interior almost looking fungal. There was a rim of blister through part of the area although this did not look like zoster. They have been applying triamcinolone that was prescribed last week by Dr. Meyer [name] and the area has a fold to a linear band area which is confluent, erythematous and with obvious epidermal swelling but there is no overt tenderness or crepitus. [name], [name] ([id number]) She has lost some surface epithelium closer to the wound surface itself and now has a  more superficial wound in this area and the extending erythema goes towards the left buttock. This is well demarcated between involved in normal skin but once again does  not appear to be at all tender. If there is a contact issue here I cannot get the history out of the daughter or the caregiver that are with her. 03/24/17; the patient arrives today with the wound slightly worse slightly more drainage. The bandlike area of erythema that I treated as a possible pineal infection has improved somewhat although proximally is still has confluent erythema without overt tenderness. We have been using silver alginate since the most recent deterioration. To the bandlike degree of erythema we have been using Lotrisone cream 03/31/17; patient arrives today with the wound slightly larger, necrotic surface and surrounding erythema. The bandlike area of erythema that I treated as a possible pineal infection is less swollen but still present I've been using Lotrisone cream on that largely related to the presence of a tinea looking infection when this was first seen. We've been using silver alginate. X-ray that I ordered last week showed no acute bony abnormalities mild fecal impaction. Lab work showed a comprehensive metabolic panel that was normal including an albumin of 3.8. White count was 9.5 hemoglobin 12.3 differential count normal. C-reactive protein was less than 1 and sedimentation rate and 17. The latter 2 values does not support an ongoing bacterial infection. 04/14/17; patient arrives after a 2 week hiatus. Her wound is not in good condition. Although the base of the wound looks stable she still has an erythematous area that was apparently blistered over the weekend. This again points to the left. As our intake nurse pointed out today this is in the area where the tissue folds together and we may need to prevent try to prevent this. Lab work and x-ray that I did to 3 weeks ago were unremarkable including her albumin nevertheless she is an extremely frail condition physically. We have have been using [name] 04/22/17; I changed her to silver alginate because of the  surrounding maceration and moisture last week. The daughter did not like the way the wound looked in the middle of the week and changed her back to [place]. They're putting gauze on top of this. Thinks still using Lotrisone. 05/06/17 on evaluation today patient sacral wound appears to be doing okay and does not seem to be any worse. She is having no significant pain during evaluation today the secondary to mental status she is unable to rate or describe whether she had any pain she was not however flinching. Her daughter states that the wound does appear to be looking better to her. Still we are having difficulty with the skinfold that seems to be closing in on itself at this point. All in all I feel like she is making some good progress in the [name] seems to be the official for her. They do tell me that a refill if we are gonna continue that today. No fevers, chills, nausea, or vomiting noted at this time. 05/13/17 presents today for evaluation concerning her ongoing sacral pressure ulcer. Unfortunately she also has an area of deep tissue injury in the right Ischial region which is starting to show up. The sacral wound also continues to show signs of necrotic tissue overlying and has declined. Overall we really have not seen a significant improvement in the past several months in regard to the sacral wound and now patient is starting to develop a new wound in the right  Ischial region. Obviously this is not trending in the direction that we want to see. No fevers, chills, nausea, or vomiting noted at this time. 05/19/17; I have not seen this wound and almost a month however there is nothing really positive to say about it. Necrotic tissue over the surface which superiorly I think abuts on her sacrum. She has surrounding erythema. I would be surprised if there is not underlying osteomyelitis or soft tissue infection. 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 She apparently eats well per  description although I wonder about this looking at her. Lab work I did probably 4 weeks ago however was really quite normal including a serum albumin Electronic Signature(s) Signed: 05/19/2017 5:34:31 PM By: Baltazar Najjar MD Entered By: Baltazar Najjar on 05/19/2017 16:36:28 [name] ([id number]) [name] ([id number]) -------------------------------------------------------------------------------- Physical Exam Details Patient Name: [name] Date of Service: 05/19/2017 3:30 PM Medical Record Patient Account Number: [id number] Number: Treating RN: [name] 11/10/1922 (83 y.o. Other Clinician: Date of Birth/Sex: Female) Treating [name] Primary Care Provider: Vonita [name] Provider/Extender: G Referring Provider: Vonita [name] Weeks in Treatment: 18 Constitutional Very frail-looking woman nonverbal. Widespread tremors. Eyes Conjunctivae clear. No discharge. Respiratory Respiratory effort is easy and symmetric bilaterally. Rate is normal at rest and on room air.. Cardiovascular She does not appear to be grossly dehydrated. Gastrointestinal (GI) Abdomen is soft and non-distended without masses or tenderness. Bowel sounds active in all quadrants.. No liver or spleen enlargement or tenderness.Marland Kitchen Psychiatric End-stage dementia. Notes Wound exam; this is deteriorated quite a bit in the almost 4 weeks that I've seen this. Very necrotic surface to the wound which I did not attempt to debride today. Continuing surrounding erythema which appears to be infectious rather than pressure injury. The area that was worrisome over the right initial area apparently still is not open Electronic Signature(s) Signed: 05/19/2017 5:34:31 PM By: Baltazar Najjar MD Entered By: Baltazar Najjar on 05/19/2017 16:52:26 [name] ([id number]) -------------------------------------------------------------------------------- Physician Orders  Details Patient Name: [name] Date of Service: 05/19/2017 3:30 PM Medical Record Patient Account Number: [id number] Number: Treating RN: [name] 1922/10/22 (83 y.o. Other Clinician: Date of Birth/Sex: Female) Treating [name] Primary Care Provider: Vonita [name] Provider/Extender: G Referring Provider: Vonita [name] Weeks in Treatment: 34 Verbal / Phone Orders: No Diagnosis Coding Wound Cleansing Wound #1 Midline Coccyx o Clean wound with Normal Saline. o May Shower, gently pat wound dry prior to applying new dressing. Anesthetic Wound #1 Midline Coccyx o Topical Lidocaine 4% cream applied to wound bed prior to debridement - in clinic Skin Barriers/Peri-Wound Care Wound #1 Midline Coccyx o Skin Prep Primary Wound Dressing Wound #1 Midline Coccyx o Other: - Dakins Secondary Dressing Wound #1 Midline Coccyx o Dry Gauze o Boardered Foam Dressing - silicone bordered dressing due to skin breakdown Dressing Change Frequency Wound #1 Midline Coccyx o Change dressing every day. Follow-up Appointments Wound #1 Midline Coccyx o Return Appointment in 1 week. Off-Loading Wound #1 Midline Coccyx LYLIANNA, FRAISER. ([id number]) o Roho cushion for wheelchair - [facility] please order roho cushion or gel cushion for patients wheelchair - whichever she qualifies for o Turn and reposition every 2 hours Additional Orders / Instructions Wound #1 Midline Coccyx o Increase protein intake. - please add protein supplements to patients diet o Other: - please add vitamin A, vitamin C and zinc supplements to patients diet Home Health Wound #1 Midline Coccyx o Initiate Home Health for Skilled Nursing   o Home Health Nurse may visit PRN to address patientos wound care needs. o FACE TO FACE ENCOUNTER: MEDICARE and MEDICAID PATIENTS: I certify that this patient is under my care and that I had a face-to-face encounter that meets the  physician face-to-face encounter requirements with this patient on this date. The encounter with the patient was in whole or in part for the following MEDICAL CONDITION: (primary reason for Home Healthcare) MEDICAL NECESSITY: I certify, that based on my findings, NURSING services are a medically necessary home health service. HOME BOUND STATUS: I certify that my clinical findings support that this patient is homebound (i.e., Due to illness or injury, pt requires aid of supportive devices such as crutches, cane, wheelchairs, walkers, the use of special transportation or the assistance of another person to leave their place of residence. There is a normal inability to leave the home and doing so requires considerable and taxing effort. Other absences are for medical reasons / religious services and are infrequent or of short duration when for other reasons). o If current dressing causes regression in wound condition, may D/C ordered dressing product/s and apply Normal Saline Moist Dressing daily until next Wound Healing Center / Other MD appointment. Notify Wound Healing Center of regression in wound condition at [phone]. o Please direct any NON-WOUND related issues/requests for orders to patient's Primary Care Physician - Dr [name] Medications-please add to medication list. Wound #1 Midline Coccyx o Other: - lotrisone cream Laboratory o Bacteria identified in Wound by Culture (MICRO) oooo LOINC Code: 6462-6 oooo Convenience Name: Wound culture routine Patient Medications Allergies: Phenergan, Sulfa (Sulfonamide Antibiotics) Notifications Medication Indication Start End Cipro wound infection 05/19/2017 [name] ([id number]) Notifications Medication Indication Start End DOSE oral 500 mg/5 mL suspension,microcapsule recon - suspension,microcapsule recon oral 5ml bid for 10 days Augmentin wound infection 05/19/2017 DOSE oral 250 mg/5 mL-62.5 mg/5 mL suspension for  reconstitution - suspension for reconstitution oral 10ml bid for 10 days Electronic Signature(s) Signed: 05/19/2017 4:47:01 PM By: Baltazar Najjar MD Previous Signature: 05/19/2017 4:43:21 PM Version By: Baltazar Najjar MD Entered By: Baltazar Najjar on 05/19/2017 16:47:01 [name] ([id number]) -------------------------------------------------------------------------------- Problem List Details Patient Name: [name] Date of Service: 05/19/2017 3:30 PM Medical Record Patient Account Number: [id number] Number: Treating RN: [name] [birth year] (83 y.o. Other Clinician: Date of Birth/Sex: Female) Treating [name] Primary Care Provider: Vonita [name] Provider/Extender: G Referring Provider: Vonita [name] Weeks in Treatment: 18 Active Problems ICD-10 Encounter Code Description Active Date Diagnosis L89.153 Pressure ulcer of sacral region, stage 3 01/07/2017 Yes G20 Parkinson's disease 01/07/2017 Yes Inactive Problems Resolved Problems Electronic Signature(s) Signed: 05/19/2017 5:34:31 PM By: Baltazar Najjar MD Entered By: Baltazar Najjar on 05/19/2017 16:31:12 [name] ([id number]) -------------------------------------------------------------------------------- Progress Note Details Patient Name: [name] Date of Service: 05/19/2017 3:30 PM Medical Record Patient Account Number: [id number] Number: Treating RN: [name] 04-03-1923 (83 y.o. Other Clinician: Date of Birth/Sex: Female) Treating [name] Primary Care Provider: Vonita [name] Provider/Extender: G Referring Provider: Vonita [name] Weeks in Treatment: 18 Subjective History of Present Illness (HPI) 01/07/17 this is a 83 year old woman admitted to the clinic today for review of a pressure ulcer on her lower sacrum. She is referred from her primary physician's office after being seen on 3/22 with a 3 cm pressure area. Her daughter and  caretaker accompanied her today state that the area first became obvious about a month ago and his since deteriorated. They have recently got [name] a home  health involved and have been using [name] to the wound. They have ordered a pressure relief surface for her mattress. They are turning her religiously. They state that she eats well and they've been forcing fluids on her. She is on a multivitamin. Looking through [facility] point last albumin I see was 4.4 on 10/28. The patient has advanced parkinsonism which looks superficially like advanced Parkinson's disease although her daughter tells me she did not ever respond to Sinemet therefore this may have another pathology with signs of parkinsonism. However I think this is largely a mute point currently. She also has dementia and is nonambulatory. Since this started they have been keeping her in bed and turning her religiously every 2 hours. She lives at home in [place] with her husband with 24/7 care giving 01/13/17 santyl change qd. Still will require further debridement. continue santyl. 01/20/17; patient's wound actually looks some better less adherent necrotic surface. There is actually visible granulation. We're using [name] 01/27/17; better-looking surface but still a lot of necrotic tissue on the base of this wound. The periwound erythema is better than last week we are still using [name]. Her daughter tells [place] that she is still having trouble with the pressure-relief mattress through medical modalities 02/03/18; I had the patient scheduled for a two week followup however her daughter brought her in early concerned for discoloration on 2 areas of the wound circumference. We have bee using santyl 02/10/17;Better looking surface to the wound. Rim appears better suggesting better offloading. Using santyl 02/24/17; change to Silver Collegen last time. Wound appears better. 03/10/17; still using silver collagen religious offloading. Intake is  satisfactory per her daughter. Dimension slightly better 03/12/2017 -- Dr. Jannetta [name] patient who had been seen 2 days ago and was doing fairly well. The patient is brought in by her daughter who noticed a new wound just above the previous wound on her sacral area and going on more to the left lateral side. She was very concerned and we asked her to get in for an opinion. 03/17/17; above is noted. The patient has developed a progressive area to the left of her original wound. This seems to this started with a ring of red skin with a more pale interior almost looking fungal. There was a rim of blister through part of the area although this did not look like zoster. They have been applying triamcinolone that was prescribed last week by Dr. Meyer [name] and the area has a fold to a linear band area which is confluent, erythematous and with obvious epidermal swelling but there is no overt tenderness or crepitus. She has lost some surface epithelium closer to the wound surface itself and now has a more superficial wound in this area and the extending erythema goes towards the left buttock. This is well demarcated [name], [name]. ([id number]) between involved in normal skin but once again does not appear to be at all tender. If there is a contact issue here I cannot get the history out of the daughter or the caregiver that are with her. 03/24/17; the patient arrives today with the wound slightly worse slightly more drainage. The bandlike area of erythema that I treated as a possible pineal infection has improved somewhat although proximally is still has confluent erythema without overt tenderness. We have been using silver alginate since the most recent deterioration. To the bandlike degree of erythema we have been using Lotrisone cream 03/31/17; patient arrives today with the wound slightly larger, necrotic surface and surrounding erythema. The  bandlike area of erythema that I treated as a possible pineal  infection is less swollen but still present I've been using Lotrisone cream on that largely related to the presence of a tinea looking infection when this was first seen. We've been using silver alginate. X-ray that I ordered last week showed no acute bony abnormalities mild fecal impaction. Lab work showed a comprehensive metabolic panel that was normal including an albumin of 3.8. White count was 9.5 hemoglobin 12.3 differential count normal. C-reactive protein was less than 1 and sedimentation rate and 17. The latter 2 values does not support an ongoing bacterial infection. 04/14/17; patient arrives after a 2 week hiatus. Her wound is not in good condition. Although the base of the wound looks stable she still has an erythematous area that was apparently blistered over the weekend. This again points to the left. As our intake nurse pointed out today this is in the area where the tissue folds together and we may need to prevent try to prevent this. Lab work and x-ray that I did to 3 weeks ago were unremarkable including her albumin nevertheless she is an extremely frail condition physically. We have have been using [name] 04/22/17; I changed her to silver alginate because of the surrounding maceration and moisture last week. The daughter did not like the way the wound looked in the middle of the week and changed her back to [place]. They're putting gauze on top of this. Thinks still using Lotrisone. 05/06/17 on evaluation today patient sacral wound appears to be doing okay and does not seem to be any worse. She is having no significant pain during evaluation today the secondary to mental status she is unable to rate or describe whether she had any pain she was not however flinching. Her daughter states that the wound does appear to be looking better to her. Still we are having difficulty with the skinfold that seems to be closing in on itself at this point. All in all I feel like she is making some  good progress in the [name] seems to be the official for her. They do tell me that a refill if we are gonna continue that today. No fevers, chills, nausea, or vomiting noted at this time. 05/13/17 presents today for evaluation concerning her ongoing sacral pressure ulcer. Unfortunately she also has an area of deep tissue injury in the right Ischial region which is starting to show up. The sacral wound also continues to show signs of necrotic tissue overlying and has declined. Overall we really have not seen a significant improvement in the past several months in regard to the sacral wound and now patient is starting to develop a new wound in the right Ischial region. Obviously this is not trending in the direction that we want to see. No fevers, chills, nausea, or vomiting noted at this time. 05/19/17; I have not seen this wound and almost a month however there is nothing really positive to say about it. Necrotic tissue over the surface which superiorly I think abuts on her sacrum. She has surrounding erythema. I would be surprised if there is not underlying osteomyelitis or soft tissue infection. 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 She apparently eats well per description although I wonder about this looking at her. Lab work I did probably 4 weeks ago however was really quite normal including a serum albumin [name], [name]. ([id number]) Objective Constitutional Very frail-looking woman nonverbal. Widespread tremors. Vitals Time Taken: 3:39 PM, Temperature:  99.1 F, Pulse: 60 bpm, Respiratory Rate: 14 breaths/min. Eyes Conjunctivae clear. No discharge. Respiratory Respiratory effort is easy and symmetric bilaterally. Rate is normal at rest and on room air.. Cardiovascular She does not appear to be grossly dehydrated. Gastrointestinal (GI) Abdomen is soft and non-distended without masses or tenderness. Bowel sounds active in all quadrants.. No liver or spleen enlargement or  tenderness.Marland Kitchen Psychiatric End-stage dementia. General Notes: Wound exam; this is deteriorated quite a bit in the almost 4 weeks that I've seen this. Very necrotic surface to the wound which I did not attempt to debride today. Continuing surrounding erythema which appears to be infectious rather than pressure injury. The area that was worrisome over the right initial area apparently still is not open Integumentary (Hair, Skin) Wound #1 status is Open. Original cause of wound was Pressure Injury. The wound is located on the Midline Coccyx. The wound measures 5cm length x 4.2cm width x 2.1cm depth; 16.493cm^2 area and 34.636cm^3 volume. There is Fat Layer (Subcutaneous Tissue) Exposed exposed. There is no undermining noted, however, there is tunneling at 12:00 with a maximum distance of 2.4cm. There is a large amount of purulent drainage noted. The wound margin is flat and intact. There is no granulation within the wound bed. There is a large (67-100%) amount of necrotic tissue within the wound bed including Eschar and Adherent Slough. The periwound skin appearance exhibited: Induration, Erythema. The periwound skin appearance did not exhibit: Callus, Crepitus, Excoriation, Rash, Scarring, Dry/Scaly, Maceration, Atrophie Blanche, Cyanosis, Ecchymosis, Hemosiderin Staining, Mottled, Pallor, Rubor. The surrounding wound skin color is noted with erythema which is circumferential. Periwound temperature was noted as No Abnormality. The periwound has tenderness on palpation. Assessment [name], [name] ([id number]) Active Problems ICD-10 [phone] - Pressure ulcer of sacral region, stage 3 G20 - Parkinson's disease Plan Wound Cleansing: Wound #1 Midline Coccyx: Clean wound with Normal Saline. May Shower, gently pat wound dry prior to applying new dressing. Anesthetic: Wound #1 Midline Coccyx: Topical Lidocaine 4% cream applied to wound bed prior to debridement - in clinic Skin  Barriers/Peri-Wound Care: Wound #1 Midline Coccyx: Skin Prep Primary Wound Dressing: Wound #1 Midline Coccyx: Other: - Dakins Secondary Dressing: Wound #1 Midline Coccyx: Dry Gauze Boardered Foam Dressing - silicone bordered dressing due to skin breakdown Dressing Change Frequency: Wound #1 Midline Coccyx: Change dressing every day. Follow-up Appointments: Wound #1 Midline Coccyx: Return Appointment in 1 week. Off-Loading: Wound #1 Midline Coccyx: Roho cushion for wheelchair - HHRN please order roho cushion or gel cushion for patients wheelchair - whichever she qualifies for Turn and reposition every 2 hours Additional Orders / Instructions: Wound #1 Midline Coccyx: Increase protein intake. - please add protein supplements to patients diet Other: - please add vitamin A, vitamin C and zinc supplements to patients diet Home Health: Wound #1 Midline Coccyx: Initiate Home Health for Skilled Nursing Home Health Nurse may visit PRN to address patient s wound care needs. FACE TO FACE ENCOUNTER: MEDICARE and MEDICAID PATIENTS: I certify that this patient is under [name], [name] ([id number]) my care and that I had a face-to-face encounter that meets the physician face-to-face encounter requirements with this patient on this date. The encounter with the patient was in whole or in part for the following MEDICAL CONDITION: (primary reason for Home Healthcare) MEDICAL NECESSITY: I certify, that based on my findings, NURSING services are a medically necessary home health service. HOME BOUND STATUS: I certify that my clinical findings support that this patient is homebound (i.e., Due  to illness or injury, pt requires aid of supportive devices such as crutches, cane, wheelchairs, walkers, the use of special transportation or the assistance of another person to leave their place of residence. There is a normal inability to leave the home and doing so requires considerable and taxing effort.  Other absences are for medical reasons / religious services and are infrequent or of short duration when for other reasons). If current dressing causes regression in wound condition, may D/C ordered dressing product/s and apply Normal Saline Moist Dressing daily until next Wound Healing Center / Other MD appointment. Notify Wound Healing Center of regression in wound condition at [phone]. Please direct any NON-WOUND related issues/requests for orders to patient's Primary Care Physician - Dr [name] Medications-please add to medication list.: Wound #1 Midline Coccyx: Other: - lotrisone cream Laboratory ordered were: Wound culture routine The following medication(s) was prescribed: Cipro oral 500 mg/5 mL suspension,microcapsule recon suspension,microcapsule recon oral 25ml bid for 10 days for wound infection starting 05/19/2017 Augmentin oral 250 mg/5 mL-62.5 mg/5 mL suspension for reconstitution suspension for reconstitution oral 59ml bid for 10 days for wound infection starting 05/19/2017 o #1 this wound is deteriorated quite a bit since I last saw this. The entire surface is necrotic and I think superiorly there is undermining with necrotic tissue over bone. There is surrounding erythema and probable tenderness but no crepitus #2 I did a deep tissue culture of this #3 empiric Augmentin and ciprofloxacin both suspension and both for 10 days #4 I think it would be very difficult/impossible to get this patient through an MRI to look at the underlying bone and then the idea of giving her IV antibiotics through a PICC line is difficult to envision given the advanced state of her dementia. #5 her general physical status continues to deteriorate although I don't really think her daughter sees this [name], [name] ([id number]) Electronic Signature(s) Signed: 05/19/2017 4:53:18 PM By: Baltazar Najjar MD Entered By: Baltazar Najjar on 05/19/2017 16:53:18 [name]  ([id number]) -------------------------------------------------------------------------------- SuperBill Details Patient Name: [name] Date of Service: 05/19/2017 Medical Record Patient Account Number: [id number] Number: Treating RN: [name] September 24, 1923 (83 y.o. Other Clinician: Date of Birth/Sex: Female) Treating [name] Primary Care Provider: Vonita [name] Provider/Extender: G Referring Provider: Vonita [name] Weeks in Treatment: 18 Diagnosis Coding ICD-10 Codes Code Description L89.153 Pressure ulcer of sacral region, stage 3 G20 Parkinson's disease Facility Procedures CPT4 Code: [id number] Description: 99213 - WOUND CARE VISIT-LEV 3 EST PT Modifier: Quantity: 1 Physician Procedures CPT4 Code: [id number] Description: 99213 - WC PHYS LEVEL 3 - EST PT ICD-10 Description Diagnosis L89.153 Pressure ulcer of sacral region, stage 3 Modifier: Quantity: 1 Electronic Signature(s) Signed: 05/19/2017 5:34:31 PM By: Baltazar Najjar MD Entered By: Baltazar Najjar on 05/19/2017 16:53:44

## 2017-05-21 NOTE — Progress Notes (Signed)
RUDINE, RIEGER ([id number]) Visit Report for 05/19/2017 Arrival Information Details Patient Name: [name], [name] Date of Service: 05/19/2017 3:30 PM Medical Record Patient Account Number: [id number] Number: Treating RN: Curtis Sites [birth year] (83 y.o. Other Clinician: Date of Birth/Sex: Female) Treating ROBSON, MICHAEL Primary Care Alishea Beaudin: Vonita Moss January Bergthold/Extender: G Referring Ura Yingling: Vonita Moss Weeks in Treatment: 18 Visit Information History Since Last Visit Added or deleted any medications: No Patient Arrived: Wheel Chair Any new allergies or adverse No reactions: Arrival Time: 15:34 Had a fall or experienced change in No Accompanied By: daughter activities of daily living that may Transfer Assistance: Manual affect Patient Identification Verified: Yes risk of falls: Secondary Verification Process Yes Signs or symptoms of abuse/neglect No Completed: since last visito Patient Requires Transmission-Based No Hospitalized since last visit: No Precautions: Has Dressing in Place as Yes Patient Has Alerts: No Prescribed: Pain Present Now: Unable to Respond Electronic Signature(s) Signed: 05/19/2017 5:04:14 PM By: Curtis Sites Entered By: Curtis Sites on 05/19/2017 15:38:38 [name] ([id number]) -------------------------------------------------------------------------------- Clinic Level of Care Assessment Details Patient Name: [name] Date of Service: 05/19/2017 3:30 PM Medical Record Patient Account Number: [id number] Number: Treating RN: Curtis Sites December 18, 1922 (83 y.o. Other Clinician: Date of Birth/Sex: Female) Treating ROBSON, MICHAEL Primary Care Daysia Vandenboom: Vonita Moss Betul Brisky/Extender: G Referring Senya Hinzman: Vonita Moss Weeks in Treatment: 18 Clinic Level of Care Assessment Items TOOL 4 Quantity Score []  - Use when only an EandM is performed on FOLLOW-UP visit 0 ASSESSMENTS - Nursing  Assessment / Reassessment X - Reassessment of Co-morbidities (includes updates in patient status) 1 10 X - Reassessment of Adherence to Treatment Plan 1 5 ASSESSMENTS - Wound and Skin Assessment / Reassessment X - Simple Wound Assessment / Reassessment - one wound 1 5 []  - Complex Wound Assessment / Reassessment - multiple wounds 0 []  - Dermatologic / Skin Assessment (not related to wound area) 0 ASSESSMENTS - Focused Assessment []  - Circumferential Edema Measurements - multi extremities 0 []  - Nutritional Assessment / Counseling / Intervention 0 []  - Lower Extremity Assessment (monofilament, tuning fork, pulses) 0 []  - Peripheral Arterial Disease Assessment (using hand held doppler) 0 ASSESSMENTS - Ostomy and/or Continence Assessment and Care []  - Incontinence Assessment and Management 0 []  - Ostomy Care Assessment and Management (repouching, etc.) 0 PROCESS - Coordination of Care X - Simple Patient / Family Education for ongoing care 1 15 []  - Complex (extensive) Patient / Family Education for ongoing care 0 []  - Staff obtains Chiropractor, Records, Test Results / Process Orders 0 []  - Staff telephones HHA, Nursing Homes / Clarify orders / etc 0 [name], [name] ([id number]) []  - Routine Transfer to another Facility (non-emergent condition) 0 []  - Routine Hospital Admission (non-emergent condition) 0 []  - New Admissions / Manufacturing engineer / Ordering NPWT, Apligraf, etc. 0 []  - Emergency Hospital Admission (emergent condition) 0 X - Simple Discharge Coordination 1 10 []  - Complex (extensive) Discharge Coordination 0 PROCESS - Special Needs []  - Pediatric / Minor Patient Management 0 []  - Isolation Patient Management 0 []  - Hearing / Language / Visual special needs 0 []  - Assessment of Community assistance (transportation, D/C planning, etc.) 0 []  - Additional assistance / Altered mentation 0 []  - Support Surface(s) Assessment (bed, cushion, seat, etc.) 0 INTERVENTIONS - Wound  Cleansing / Measurement X - Simple Wound Cleansing - one wound 1 5 []  - Complex Wound Cleansing - multiple wounds 0 X - Wound Imaging (photographs - any number of wounds)  1 5 []  - Wound Tracing (instead of photographs) 0 X - Simple Wound Measurement - one wound 1 5 []  - Complex Wound Measurement - multiple wounds 0 INTERVENTIONS - Wound Dressings X - Small Wound Dressing one or multiple wounds 1 10 []  - Medium Wound Dressing one or multiple wounds 0 []  - Large Wound Dressing one or multiple wounds 0 []  - Application of Medications - topical 0 []  - Application of Medications - injection 0 [name], [name]. ([id number]) INTERVENTIONS - Miscellaneous []  - External ear exam 0 []  - Specimen Collection (cultures, biopsies, blood, body fluids, etc.) 0 X - Specimen(s) / Culture(s) sent or taken to Lab for analysis 1 5 []  - Patient Transfer (multiple staff / Michiel Sites Lift / Similar devices) 0 []  - Simple Staple / Suture removal (25 or less) 0 []  - Complex Staple / Suture removal (26 or more) 0 []  - Hypo / Hyperglycemic Management (close monitor of Blood Glucose) 0 []  - Ankle / Brachial Index (ABI) - do not check if billed separately 0 X - Vital Signs 1 5 Has the patient been seen at the hospital within the last three years: Yes Total Score: 80 Level Of Care: New/Established - Level 3 Electronic Signature(s) Signed: 05/19/2017 5:04:14 PM By: Curtis Sites Entered By: Curtis Sites on 05/19/2017 16:52:31 [name] ([id number]) -------------------------------------------------------------------------------- Encounter Discharge Information Details Patient Name: [name] Date of Service: 05/19/2017 3:30 PM Medical Record Patient Account Number: [id number] Number: Treating RN: Curtis Sites Dec 30, 1922 (83 y.o. Other Clinician: Date of Birth/Sex: Female) Treating ROBSON, MICHAEL Primary Care Holland Nickson: Vonita Moss Sekou Zuckerman/Extender: G Referring Jissell Trafton: Vonita Moss Weeks in Treatment: 33 Encounter Discharge Information Items Discharge Pain Level: 0 Discharge Condition: Stable Ambulatory Status: Wheelchair Discharge Destination: Home Transportation: Private Auto Accompanied By: daughter Schedule Follow-up Appointment: Yes Medication Reconciliation completed and provided to Patient/Care No Aquarius Tremper: Provided on Clinical Summary of Care: 05/19/2017 Form Type Recipient Paper Patient ES Electronic Signature(s) Signed: 05/20/2017 8:53:21 AM By: Gwenlyn Perking Entered By: Gwenlyn Perking on 05/19/2017 16:25:17 [name] ([id number]) -------------------------------------------------------------------------------- Multi Wound Chart Details Patient Name: [name] Date of Service: 05/19/2017 3:30 PM Medical Record Patient Account Number: [id number] Number: Treating RN: Curtis Sites 1923/02/01 (83 y.o. Other Clinician: Date of Birth/Sex: Female) Treating ROBSON, MICHAEL Primary Care Aycen Porreca: Vonita Moss Kenita Bines/Extender: G Referring Nishtha Raider: Vonita Moss Weeks in Treatment: 18 Vital Signs Height(in): Pulse(bpm): 60 Weight(lbs): Blood Pressure (mmHg): Body Mass Index(BMI): Temperature(F): 99.1 Respiratory Rate 14 (breaths/min): Photos: [N/A:N/A] Wound Location: Coccyx - Midline N/A N/A Wounding Event: Pressure Injury N/A N/A Primary Etiology: Pressure Ulcer N/A N/A Comorbid History: Anemia, Hypertension, N/A N/A Dementia Date Acquired: 12/01/2016 N/A N/A Weeks of Treatment: 18 N/A N/A Wound Status: Open N/A N/A Measurements L x W x D 5x4.2x2.1 N/A N/A (cm) Area (cm) : 16.493 N/A N/A Volume (cm) : 34.636 N/A N/A % Reduction in Area: -296.90% N/A N/A % Reduction in Volume: -1984.00% N/A N/A Position 1 (o'clock): 12 Maximum Distance 1 2.4 (cm): Tunneling: Yes N/A N/A Classification: Category/Stage III N/A N/A Exudate Amount: Large N/A N/A Exudate Type: Purulent N/A N/A [name], [name]  ([id number]) Exudate Color: yellow, brown, green N/A N/A Foul Odor After Yes N/A N/A Cleansing: Odor Anticipated Due to No N/A N/A Product Use: Wound Margin: Flat and Intact N/A N/A Granulation Amount: None Present (0%) N/A N/A Necrotic Amount: Large (67-100%) N/A N/A Necrotic Tissue: Eschar, Adherent Slough N/A N/A Exposed Structures: Fat Layer (Subcutaneous N/A N/A Tissue) Exposed:  Yes Fascia: No Tendon: No Muscle: No Joint: No Bone: No Epithelialization: None N/A N/A Periwound Skin Texture: Induration: Yes N/A N/A Excoriation: No Callus: No Crepitus: No Rash: No Scarring: No Periwound Skin Maceration: No N/A N/A Moisture: Dry/Scaly: No Periwound Skin Color: Erythema: Yes N/A N/A Atrophie Blanche: No Cyanosis: No Ecchymosis: No Hemosiderin Staining: No Mottled: No Pallor: No Rubor: No Erythema Location: Circumferential N/A N/A Temperature: No Abnormality N/A N/A Tenderness on Yes N/A N/A Palpation: Wound Preparation: Ulcer Cleansing: N/A N/A Rinsed/Irrigated with Saline Topical Anesthetic Applied: Other: lidocaine 4% Treatment Notes Electronic Signature(s) Signed: 05/19/2017 5:34:31 PM By: Baltazar Najjar MD [name] ([id number]) Entered By: Baltazar Najjar on 05/19/2017 16:31:23 [name] ([id number]) -------------------------------------------------------------------------------- Multi-Disciplinary Care Plan Details Patient Name: [name] Date of Service: 05/19/2017 3:30 PM Medical Record Patient Account Number: [id number] Number: Treating RN: Curtis Sites 03-20-1923 (83 y.o. Other Clinician: Date of Birth/Sex: Female) Treating ROBSON, MICHAEL Primary Care Pepe Mineau: Vonita Moss Makahla Kiser/Extender: G Referring Cassidie Veiga: Vonita Moss Weeks in Treatment: 18 Active Inactive ` Abuse / Safety / Falls / Self Care Management Nursing Diagnoses: Impaired physical mobility Potential for falls Goals: Patient will  remain injury free Date Initiated: 01/07/2017 Target Resolution Date: 04/03/2017 Goal Status: Active Interventions: Assess fall risk on admission and as needed Notes: ` Nutrition Nursing Diagnoses: Potential for alteratiion in Nutrition/Potential for imbalanced nutrition Goals: Patient/caregiver agrees to and verbalizes understanding of need to use nutritional supplements and/or vitamins as prescribed Date Initiated: 01/07/2017 Target Resolution Date: 04/03/2017 Goal Status: Active Interventions: Assess patient nutrition upon admission and as needed per policy Notes: ` Orientation to the Wound Care Program [name], [name] ([id number]) Nursing Diagnoses: Knowledge deficit related to the wound healing center program Goals: Patient/caregiver will verbalize understanding of the Wound Healing Center Program Date Initiated: 01/07/2017 Target Resolution Date: 04/03/2017 Goal Status: Active Interventions: Provide education on orientation to the wound center Notes: ` Pressure Nursing Diagnoses: Knowledge deficit related to causes and risk factors for pressure ulcer development Goals: Patient will remain free from development of additional pressure ulcers Date Initiated: 01/07/2017 Target Resolution Date: 04/03/2017 Goal Status: Active Interventions: Assess potential for pressure ulcer upon admission and as needed Notes: ` Wound/Skin Impairment Nursing Diagnoses: Impaired tissue integrity Goals: Patient/caregiver will verbalize understanding of skin care regimen Date Initiated: 01/07/2017 Target Resolution Date: 04/03/2017 Goal Status: Active Ulcer/skin breakdown will have a volume reduction of 30% by week 4 Date Initiated: 01/07/2017 Target Resolution Date: 04/03/2017 Goal Status: Active Ulcer/skin breakdown will have a volume reduction of 50% by week 8 Date Initiated: 01/07/2017 Target Resolution Date: 04/03/2017 Goal Status: Active Ulcer/skin breakdown will have a volume reduction  of 80% by week 12 [name], [name] ([id number]) Date Initiated: 01/07/2017 Target Resolution Date: 04/03/2017 Goal Status: Active Ulcer/skin breakdown will heal within 14 weeks Date Initiated: 01/07/2017 Target Resolution Date: 04/03/2017 Goal Status: Active Interventions: Assess patient/caregiver ability to obtain necessary supplies Assess patient/caregiver ability to perform ulcer/skin care regimen upon admission and as needed Assess ulceration(s) every visit Notes: Electronic Signature(s) Signed: 05/19/2017 5:04:14 PM By: Curtis Sites Entered By: Curtis Sites on 05/19/2017 15:47:27 [name] ([id number]) -------------------------------------------------------------------------------- Pain Assessment Details Patient Name: [name] Date of Service: 05/19/2017 3:30 PM Medical Record Patient Account Number: [id number] Number: Treating RN: Curtis Sites 12-15-1922 (83 y.o. Other Clinician: Date of Birth/Sex: Female) Treating ROBSON, MICHAEL Primary Care Romyn Boswell: Vonita Moss Joushua Dugar/Extender: G Referring [name]: Vonita Moss Weeks in Treatment: 18 Active Problems Location of Pain Severity and Description  of Pain Patient Has Paino Patient Unable to Respond Site Locations Pain Management and Medication Current Pain Management: Electronic Signature(s) Signed: 05/19/2017 5:04:14 PM By: Curtis Sites Entered By: Curtis Sites on 05/19/2017 15:38:50 [name] ([id number]) -------------------------------------------------------------------------------- Patient/Caregiver Education Details Patient Name: [name] Date of Service: 05/19/2017 3:30 PM Medical Record Patient Account Number: [id number] Number: Treating RN: Curtis Sites 1923-09-23 (83 y.o. Other Clinician: Date of Birth/Gender: Female) Treating ROBSON, MICHAEL Primary Care Physician: Vonita Moss Physician/Extender: G Referring Physician: Veverly Fells in Treatment: 42 Education Assessment Education Provided To: Caregiver Education Topics Provided Wound/Skin Impairment: Handouts: Other: wound care as ordered Methods: Demonstration, Explain/Verbal Responses: State content correctly Electronic Signature(s) Signed: 05/19/2017 5:04:14 PM By: Curtis Sites Entered By: Curtis Sites on 05/19/2017 15:58:29 [name] ([id number]) -------------------------------------------------------------------------------- Wound Assessment Details Patient Name: [name] Date of Service: 05/19/2017 3:30 PM Medical Record Patient Account Number: [id number] Number: Treating RN: Curtis Sites Sep 02, 1923 (83 y.o. Other Clinician: Date of Birth/Sex: Female) Treating ROBSON, MICHAEL Primary Care Jhace Fennell: Vonita Moss Tina Najera/Extender: G Referring Brynlie Daza: Vonita Moss Weeks in Treatment: 18 Wound Status Wound Number: 1 Primary Etiology: Pressure Ulcer Wound Location: Coccyx - Midline Wound Status: Open Wounding Event: Pressure Injury Comorbid History: Anemia, Hypertension, Dementia Date Acquired: 12/01/2016 Weeks Of Treatment: 18 Clustered Wound: No Photos Photo Uploaded By: Curtis Sites on 05/19/2017 15:55:37 Wound Measurements Length: (cm) 5 % Reduction in A Width: (cm) 4.2 % Reduction in V Depth: (cm) 2.1 Epithelializatio Area: (cm) 16.493 Tunneling: Volume: (cm) 34.636 Position (o' Maximum Dista rea: -296.9% olume: -1984% n: None Yes clock): 12 nce: (cm) 2.4 Undermining: No Wound Description Classification: Category/Stage III Wound Margin: Flat and Intact Exudate Amount: Large Exudate Type: Purulent Exudate Color: yellow, brown, green Foul Odor After Cleansing: Yes Due to Product Use: No Slough/Fibrino Yes Wound Bed [name], [name] ([id number]) Granulation Amount: None Present (0%) Exposed Structure Necrotic Amount: Large (67-100%) Fascia Exposed: No Necrotic Quality:  Eschar, Adherent Slough Fat Layer (Subcutaneous Tissue) Exposed: Yes Tendon Exposed: No Muscle Exposed: No Joint Exposed: No Bone Exposed: No Periwound Skin Texture Texture Color No Abnormalities Noted: No No Abnormalities Noted: No Callus: No Atrophie Blanche: No Crepitus: No Cyanosis: No Excoriation: No Ecchymosis: No Induration: Yes Erythema: Yes Rash: No Erythema Location: Circumferential Scarring: No Hemosiderin Staining: No Mottled: No Moisture Pallor: No No Abnormalities Noted: No Rubor: No Dry / Scaly: No Maceration: No Temperature / Pain Temperature: No Abnormality Tenderness on Palpation: Yes Wound Preparation Ulcer Cleansing: Rinsed/Irrigated with Saline Topical Anesthetic Applied: Other: lidocaine 4%, Treatment Notes Wound #1 (Midline Coccyx) 1. Cleansed with: Clean wound with Normal Saline 2. Anesthetic Topical Lidocaine 4% cream to wound bed prior to debridement 4. Dressing Applied: Other dressing (specify in notes) 5. Secondary Dressing Applied Bordered Foam Dressing Dry Gauze Notes Dakins Solution Electronic Signature(s) Signed: 05/19/2017 5:04:14 PM By: Curtis Sites Entered By: Curtis Sites on 05/19/2017 15:47:14 [name] ([id number]) [name] ([id number]) -------------------------------------------------------------------------------- Vitals Details Patient Name: [name] Date of Service: 05/19/2017 3:30 PM Medical Record Patient Account Number: [id number] Number: Treating RN: Curtis Sites [birth year] (83 y.o. Other Clinician: Date of Birth/Sex: Female) Treating ROBSON, MICHAEL Primary Care Shamel Germond: Vonita Moss Elley Harp/Extender: G Referring Ronen Bromwell: Vonita Moss Weeks in Treatment: 18 Vital Signs Time Taken: 15:39 Temperature (F): 99.1 Pulse (bpm): 60 Respiratory Rate (breaths/min): 14 Reference Range: 80 - 120 mg / dl Electronic Signature(s) Signed: 05/19/2017 5:04:14 PM By:  Curtis Sites Entered By: Curtis Sites on 05/19/2017  15:39:10 

## 2017-05-25 LAB — AEROBIC CULTURE  (SUPERFICIAL SPECIMEN)

## 2017-05-25 LAB — AEROBIC CULTURE W GRAM STAIN (SUPERFICIAL SPECIMEN)

## 2017-05-26 ENCOUNTER — Encounter: Payer: Medicare Other | Admitting: Internal Medicine

## 2017-05-26 DIAGNOSIS — L89153 Pressure ulcer of sacral region, stage 3: Secondary | ICD-10-CM | POA: Diagnosis not present

## 2017-05-27 ENCOUNTER — Telehealth: Payer: Self-pay | Admitting: Family Medicine

## 2017-05-27 NOTE — Telephone Encounter (Signed)
Please call Erie Noe with [facility] regarding a referral on this patient.  Erie Noe [phone]  Thank you

## 2017-05-27 NOTE — Telephone Encounter (Signed)
Left message on machine for pt to return call to the office.  

## 2017-05-28 NOTE — Progress Notes (Signed)
GENEA, RHEAUME ([id number]) Visit Report for 05/26/2017 HPI Details Patient Name: [name], [name] Date of Service: 05/26/2017 10:15 AM Medical Record Patient Account Number: [id number] Number: Treating RN: Curtis Sites 1923/04/13 (83 y.o. Other Clinician: Date of Birth/Sex: Female) Treating ROBSON, MICHAEL Primary Care Provider: Vonita Moss Provider/Extender: G Referring Provider: Vonita Moss Weeks in Treatment: 19 History of Present Illness HPI Description: 01/07/17 this is a 83 year old woman admitted to the clinic today for review of a pressure ulcer on her lower sacrum. She is referred from her primary physician's office after being seen on 3/22 with a 3 cm pressure area. Her daughter and caretaker accompanied her today state that the area first became obvious about a month ago and his since deteriorated. They have recently got [name] a home health involved and have been using [name] to the wound. They have ordered a pressure relief surface for her mattress. They are turning her religiously. They state that she eats well and they've been forcing fluids on her. She is on a multivitamin. Looking through [facility] point last albumin I see was 4.4 on 10/28. The patient has advanced parkinsonism which looks superficially like advanced Parkinson's disease although her daughter tells me she did not ever respond to Sinemet therefore this may have another pathology with signs of parkinsonism. However I think this is largely a mute point currently. She also has dementia and is nonambulatory. Since this started they have been keeping her in bed and turning her religiously every 2 hours. She lives at home in [place] with her husband with 24/7 care giving 01/13/17 santyl change qd. Still will require further debridement. continue santyl. 01/20/17; patient's wound actually looks some better less adherent necrotic surface. There is actually visible granulation. We're using  [name] 01/27/17; better-looking surface but still a lot of necrotic tissue on the base of this wound. The periwound erythema is better than last week we are still using [name]. Her daughter tells [place] that she is still having trouble with the pressure-relief mattress through medical modalities 02/03/18; I had the patient scheduled for a two week followup however her daughter brought her in early concerned for discoloration on 2 areas of the wound circumference. We have bee using santyl 02/10/17;Better looking surface to the wound. Rim appears better suggesting better offloading. Using santyl 02/24/17; change to Silver Collegen last time. Wound appears better. 03/10/17; still using silver collagen religious offloading. Intake is satisfactory per her daughter. Dimension slightly better 03/12/2017 -- Dr. Jannetta Quint patient who had been seen 2 days ago and was doing fairly well. The patient is brought in by her daughter who noticed a new wound just above the previous wound on her sacral area and going on more to the left lateral side. She was very concerned and we asked her to get in for an opinion. 03/17/17; above is noted. The patient has developed a progressive area to the left of her original wound. This seems to this started with a ring of red skin with a more pale interior almost looking fungal. There was a rim of blister through part of the area although this did not look like zoster. They have been applying triamcinolone that was prescribed last week by Dr. Meyer Russel and the area has a fold to a linear band area which is confluent, erythematous and with obvious epidermal swelling but there is no overt tenderness or crepitus. [name] ([id number]) She has lost some surface epithelium closer to the wound surface itself and now has a  more superficial wound in this area and the extending erythema goes towards the left buttock. This is well demarcated between involved in normal skin but once again does  not appear to be at all tender. If there is a contact issue here I cannot get the history out of the daughter or the caregiver that are with her. 03/24/17; the patient arrives today with the wound slightly worse slightly more drainage. The bandlike area of erythema that I treated as a possible pineal infection has improved somewhat although proximally is still has confluent erythema without overt tenderness. We have been using silver alginate since the most recent deterioration. To the bandlike degree of erythema we have been using Lotrisone cream 03/31/17; patient arrives today with the wound slightly larger, necrotic surface and surrounding erythema. The bandlike area of erythema that I treated as a possible pineal infection is less swollen but still present I've been using Lotrisone cream on that largely related to the presence of a tinea looking infection when this was first seen. We've been using silver alginate. X-ray that I ordered last week showed no acute bony abnormalities mild fecal impaction. Lab work showed a comprehensive metabolic panel that was normal including an albumin of 3.8. White count was 9.5 hemoglobin 12.3 differential count normal. C-reactive protein was less than 1 and sedimentation rate and 17. The latter 2 values does not support an ongoing bacterial infection. 04/14/17; patient arrives after a 2 week hiatus. Her wound is not in good condition. Although the base of the wound looks stable she still has an erythematous area that was apparently blistered over the weekend. This again points to the left. As our intake nurse pointed out today this is in the area where the tissue folds together and we may need to prevent try to prevent this. Lab work and x-ray that I did to 3 weeks ago were unremarkable including her albumin nevertheless she is an extremely frail condition physically. We have have been using [name] 04/22/17; I changed her to silver alginate because of the  surrounding maceration and moisture last week. The daughter did not like the way the wound looked in the middle of the week and changed her back to [place]. They're putting gauze on top of this. Thinks still using Lotrisone. 05/06/17 on evaluation today patient sacral wound appears to be doing okay and does not seem to be any worse. She is having no significant pain during evaluation today the secondary to mental status she is unable to rate or describe whether she had any pain she was not however flinching. Her daughter states that the wound does appear to be looking better to her. Still we are having difficulty with the skinfold that seems to be closing in on itself at this point. All in all I feel like she is making some good progress in the [name] seems to be the official for her. They do tell me that a refill if we are gonna continue that today. No fevers, chills, nausea, or vomiting noted at this time. 05/13/17 presents today for evaluation concerning her ongoing sacral pressure ulcer. Unfortunately she also has an area of deep tissue injury in the right Ischial region which is starting to show up. The sacral wound also continues to show signs of necrotic tissue overlying and has declined. Overall we really have not seen a significant improvement in the past several months in regard to the sacral wound and now patient is starting to develop a new wound in the right  Ischial region. Obviously this is not trending in the direction that we want to see. No fevers, chills, nausea, or vomiting noted at this time. 05/19/17; I have not seen this wound and almost a month however there is nothing really positive to say about it. Necrotic tissue over the surface which superiorly I think abuts on her sacrum. She has surrounding erythema. I would be surprised if there is not underlying osteomyelitis or soft tissue infection. 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 She apparently eats well per  description although I wonder about this looking at her. Lab work I did probably 4 weeks ago however was really quite normal including a serum albumin 05/26/17; culture I did last week grew Escherichia coli and methicillin sensitive staph aureus which should've been well covered by the Augmentin and ciprofloxacin. Indeed the erythema around the wound in the bed of the wound looks somewhat better. Her intake is still satisfactory. They now have a near fluidized bed RUQAYYA, VENTRESS ([id number]) Electronic Signature(s) Signed: 05/27/2017 7:49:24 AM By: Baltazar Najjar MD Entered By: Baltazar Najjar on 05/26/2017 10:34:26 [name] ([id number]) -------------------------------------------------------------------------------- Physical Exam Details Patient Name: [name] Date of Service: 05/26/2017 10:15 AM Medical Record Patient Account Number: [id number] Number: Treating RN: Curtis Sites Sep 03, 1923 (83 y.o. Other Clinician: Date of Birth/Sex: Female) Treating ROBSON, MICHAEL Primary Care Provider: Vonita Moss Provider/Extender: G Referring Provider: Vonita Moss Weeks in Treatment: 19 Constitutional Sitting or standing Blood Pressure is within target range for patient.. Pulse regular and within target range for patient.Marland Kitchen Respirations regular, non-labored and within target range.. Temperature is normal and within the target range for the patient.. Very frail patient, situation compatible with advanced dementia. Eyes Conjunctivae clear. No discharge. Respiratory Respiratory effort is easy and symmetric bilaterally. Rate is normal at rest and on room air.. Cardiovascular Does not appear to be dehydrated. Gastrointestinal (GI) Abdomen is soft and non-distended without masses or tenderness. Bowel sounds active in all quadrants.. Genitourinary (GU) No tenderness is noted. Psychiatric Advanced dementia. Notes Wound exam; wound surface actually looks better  with less necrotic covering. No debridement was done. Surrounding erythema also better and there is less tenderness year, still some surrounding erythema and some tissue destruction inferiorly no doubt due to infection. Electronic Signature(s) Signed: 05/27/2017 7:49:24 AM By: Baltazar Najjar MD Entered By: Baltazar Najjar on 05/26/2017 10:38:42 [name] ([id number]) -------------------------------------------------------------------------------- Physician Orders Details Patient Name: [name] Date of Service: 05/26/2017 10:15 AM Medical Record Patient Account Number: [id number] Number: Treating RN: Curtis Sites October 23, 1922 (83 y.o. Other Clinician: Date of Birth/Sex: Female) Treating ROBSON, MICHAEL Primary Care Provider: Vonita Moss Provider/Extender: G Referring Provider: Vonita Moss Weeks in Treatment: 37 Verbal / Phone Orders: No Diagnosis Coding Wound Cleansing Wound #1 Midline Coccyx o Clean wound with Normal Saline. o May Shower, gently pat wound dry prior to applying new dressing. Anesthetic Wound #1 Midline Coccyx o Topical Lidocaine 4% cream applied to wound bed prior to debridement - in clinic Skin Barriers/Peri-Wound Care Wound #1 Midline Coccyx o Skin Prep Primary Wound Dressing Wound #1 Midline Coccyx o Prisma Ag - or collagen with silver equivalent Secondary Dressing Wound #1 Midline Coccyx o Dry Gauze o Boardered Foam Dressing - silicone bordered dressing due to skin breakdown Dressing Change Frequency Wound #1 Midline Coccyx o Change dressing every day. Follow-up Appointments Wound #1 Midline Coccyx o Return Appointment in 1 week. Off-Loading Wound #1 Midline Coccyx AURIAH, HOLLINGS. ([id number]) o Roho cushion for  wheelchair - [facility] please order roho cushion or gel cushion for patients wheelchair - whichever she qualifies for o Turn and reposition every 2 hours Additional Orders /  Instructions Wound #1 Midline Coccyx o Increase protein intake. - please add protein supplements to patients diet o Other: - please add vitamin A, vitamin C and zinc supplements to patients diet Home Health Wound #1 Midline Coccyx o Continue Home Health Visits o Home Health Nurse may visit PRN to address patientos wound care needs. o FACE TO FACE ENCOUNTER: MEDICARE and MEDICAID PATIENTS: I certify that this patient is under my care and that I had a face-to-face encounter that meets the physician face-to-face encounter requirements with this patient on this date. The encounter with the patient was in whole or in part for the following MEDICAL CONDITION: (primary reason for Home Healthcare) MEDICAL NECESSITY: I certify, that based on my findings, NURSING services are a medically necessary home health service. HOME BOUND STATUS: I certify that my clinical findings support that this patient is homebound (i.e., Due to illness or injury, pt requires aid of supportive devices such as crutches, cane, wheelchairs, walkers, the use of special transportation or the assistance of another person to leave their place of residence. There is a normal inability to leave the home and doing so requires considerable and taxing effort. Other absences are for medical reasons / religious services and are infrequent or of short duration when for other reasons). o If current dressing causes regression in wound condition, may D/C ordered dressing product/s and apply Normal Saline Moist Dressing daily until next Wound Healing Center / Other MD appointment. Notify Wound Healing Center of regression in wound condition at [phone]. o Please direct any NON-WOUND related issues/requests for orders to patient's Primary Care Physician - Dr Vonita Moss Electronic Signature(s) Signed: 05/26/2017 5:05:53 PM By: Curtis Sites Signed: 05/27/2017 7:49:24 AM By: Baltazar Najjar MD Entered By: Curtis Sites  on 05/26/2017 10:25:07 [name] ([id number]) -------------------------------------------------------------------------------- Problem List Details Patient Name: [name] Date of Service: 05/26/2017 10:15 AM Medical Record Patient Account Number: [id number] Number: Treating RN: Curtis Sites 11-14-1922 (83 y.o. Other Clinician: Date of Birth/Sex: Female) Treating ROBSON, MICHAEL Primary Care Provider: Vonita Moss Provider/Extender: G Referring Provider: Vonita Moss Weeks in Treatment: 18 Active Problems ICD-10 Encounter Code Description Active Date Diagnosis L89.153 Pressure ulcer of sacral region, stage 3 01/07/2017 Yes G20 Parkinson's disease 01/07/2017 Yes Inactive Problems Resolved Problems Electronic Signature(s) Signed: 05/27/2017 7:49:24 AM By: Baltazar Najjar MD Entered By: Baltazar Najjar on 05/26/2017 10:33:06 [name] ([id number]) -------------------------------------------------------------------------------- Progress Note Details Patient Name: [name] Date of Service: 05/26/2017 10:15 AM Medical Record Patient Account Number: [id number] Number: Treating RN: Curtis Sites [birth year] (83 y.o. Other Clinician: Date of Birth/Sex: Female) Treating ROBSON, MICHAEL Primary Care Provider: Vonita Moss Provider/Extender: G Referring Provider: Vonita Moss Weeks in Treatment: 19 Subjective History of Present Illness (HPI) 01/07/17 this is a 83 year old woman admitted to the clinic today for review of a pressure ulcer on her lower sacrum. She is referred from her primary physician's office after being seen on 3/22 with a 3 cm pressure area. Her daughter and caretaker accompanied her today state that the area first became obvious about a month ago and his since deteriorated. They have recently got [name] a home health involved and have been using [name] to the wound. They have ordered a pressure relief  surface for her mattress. They are turning her religiously. They state that she eats  well and they've been forcing fluids on her. She is on a multivitamin. Looking through [facility] point last albumin I see was 4.4 on 10/28. The patient has advanced parkinsonism which looks superficially like advanced Parkinson's disease although her daughter tells me she did not ever respond to Sinemet therefore this may have another pathology with signs of parkinsonism. However I think this is largely a mute point currently. She also has dementia and is nonambulatory. Since this started they have been keeping her in bed and turning her religiously every 2 hours. She lives at home in [place] with her husband with 24/7 care giving 01/13/17 santyl change qd. Still will require further debridement. continue santyl. 01/20/17; patient's wound actually looks some better less adherent necrotic surface. There is actually visible granulation. We're using [name] 01/27/17; better-looking surface but still a lot of necrotic tissue on the base of this wound. The periwound erythema is better than last week we are still using [name]. Her daughter tells [place] that she is still having trouble with the pressure-relief mattress through medical modalities 02/03/18; I had the patient scheduled for a two week followup however her daughter brought her in early concerned for discoloration on 2 areas of the wound circumference. We have bee using santyl 02/10/17;Better looking surface to the wound. Rim appears better suggesting better offloading. Using santyl 02/24/17; change to Silver Colle[place] last time. Wound appears better. 03/10/17; still using silver collagen religious offloading. Intake is satisfactory per her daughter. Dimension slightly better 03/12/2017 -- Dr. Jannetta Quint patient who had been seen 2 days ago and was doing fairly well. The patient is brought in by her daughter who noticed a new wound just above the previous wound on her  sacral area and going on more to the left lateral side. She was very concerned and we asked her to get in for an opinion. 03/17/17; above is noted. The patient has developed a progressive area to the left of her original wound. This seems to this started with a ring of red skin with a more pale interior almost looking fungal. There was a rim of blister through part of the area although this did not look like zoster. They have been applying triamcinolone that was prescribed last week by Dr. Meyer Russel and the area has a fold to a linear band area which is confluent, erythematous and with obvious epidermal swelling but there is no overt tenderness or crepitus. She has lost some surface epithelium closer to the wound surface itself and now has a more superficial wound in this area and the extending erythema goes towards the left buttock. This is well demarcated DACIE, MANDEL. ([id number]) between involved in normal skin but once again does not appear to be at all tender. If there is a contact issue here I cannot get the history out of the daughter or the caregiver that are with her. 03/24/17; the patient arrives today with the wound slightly worse slightly more drainage. The bandlike area of erythema that I treated as a possible pineal infection has improved somewhat although proximally is still has confluent erythema without overt tenderness. We have been using silver alginate since the most recent deterioration. To the bandlike degree of erythema we have been using Lotrisone cream 03/31/17; patient arrives today with the wound slightly larger, necrotic surface and surrounding erythema. The bandlike area of erythema that I treated as a possible pineal infection is less swollen but still present I've been using Lotrisone cream on that largely related to the presence  of a tinea looking infection when this was first seen. We've been using silver alginate. X-ray that I ordered last week showed no acute bony  abnormalities mild fecal impaction. Lab work showed a comprehensive metabolic panel that was normal including an albumin of 3.8. White count was 9.5 hemoglobin 12.3 differential count normal. C-reactive protein was less than 1 and sedimentation rate and 17. The latter 2 values does not support an ongoing bacterial infection. 04/14/17; patient arrives after a 2 week hiatus. Her wound is not in good condition. Although the base of the wound looks stable she still has an erythematous area that was apparently blistered over the weekend. This again points to the left. As our intake nurse pointed out today this is in the area where the tissue folds together and we may need to prevent try to prevent this. Lab work and x-ray that I did to 3 weeks ago were unremarkable including her albumin nevertheless she is an extremely frail condition physically. We have have been using [name] 04/22/17; I changed her to silver alginate because of the surrounding maceration and moisture last week. The daughter did not like the way the wound looked in the middle of the week and changed her back to [place]. They're putting gauze on top of this. Thinks still using Lotrisone. 05/06/17 on evaluation today patient sacral wound appears to be doing okay and does not seem to be any worse. She is having no significant pain during evaluation today the secondary to mental status she is unable to rate or describe whether she had any pain she was not however flinching. Her daughter states that the wound does appear to be looking better to her. Still we are having difficulty with the skinfold that seems to be closing in on itself at this point. All in all I feel like she is making some good progress in the [name] seems to be the official for her. They do tell me that a refill if we are gonna continue that today. No fevers, chills, nausea, or vomiting noted at this time. 05/13/17 presents today for evaluation concerning her ongoing sacral  pressure ulcer. Unfortunately she also has an area of deep tissue injury in the right Ischial region which is starting to show up. The sacral wound also continues to show signs of necrotic tissue overlying and has declined. Overall we really have not seen a significant improvement in the past several months in regard to the sacral wound and now patient is starting to develop a new wound in the right Ischial region. Obviously this is not trending in the direction that we want to see. No fevers, chills, nausea, or vomiting noted at this time. 05/19/17; I have not seen this wound and almost a month however there is nothing really positive to say about it. Necrotic tissue over the surface which superiorly I think abuts on her sacrum. She has surrounding erythema. I would be surprised if there is not underlying osteomyelitis or soft tissue infection. 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 She apparently eats well per description although I wonder about this looking at her. Lab work I did probably 4 weeks ago however was really quite normal including a serum albumin 05/26/17; culture I did last week grew Escherichia coli and methicillin sensitive staph aureus which should've been well covered by the Augmentin and ciprofloxacin. Indeed the erythema around the wound in the bed of the wound looks somewhat better. Her intake is still satisfactory. They now have a  near fluidized bed LEEANA, CREER. ([id number]) Objective Constitutional Sitting or standing Blood Pressure is within target range for patient.. Pulse regular and within target range for patient.Marland Kitchen Respirations regular, non-labored and within target range.. Temperature is normal and within the target range for the patient.. Very frail patient, situation compatible with advanced dementia. Vitals Time Taken: 10:02 AM, Temperature: 98.2 F, Pulse: 116 bpm, Respiratory Rate: 16 breaths/min. Eyes Conjunctivae clear. No  discharge. Respiratory Respiratory effort is easy and symmetric bilaterally. Rate is normal at rest and on room air.. Cardiovascular Does not appear to be dehydrated. Gastrointestinal (GI) Abdomen is soft and non-distended without masses or tenderness. Bowel sounds active in all quadrants.. Genitourinary (GU) No tenderness is noted. Psychiatric Advanced dementia. General Notes: Wound exam; wound surface actually looks better with less necrotic covering. No debridement was done. Surrounding erythema also better and there is less tenderness year, still some surrounding erythema and some tissue destruction inferiorly no doubt due to infection. Integumentary (Hair, Skin) Wound #1 status is Open. Original cause of wound was Pressure Injury. The wound is located on the Midline Coccyx. The wound measures 5.3cm length x 5cm width x 1.8cm depth; 20.813cm^2 area and 37.463cm^3 volume. There is Fat Layer (Subcutaneous Tissue) Exposed exposed. There is no tunneling or undermining noted. There is a large amount of purulent drainage noted. Foul odor after cleansing was noted. The wound margin is flat and intact. There is small (1-33%) red granulation within the wound bed. There is a large (67-100%) amount of necrotic tissue within the wound bed including Eschar and Adherent Slough. The periwound skin appearance exhibited: Induration, Erythema. The periwound skin appearance did not exhibit: Callus, Crepitus, Excoriation, Rash, Scarring, Dry/Scaly, Maceration, Atrophie Blanche, Cyanosis, Ecchymosis, Hemosiderin Staining, Mottled, Pallor, Rubor. The surrounding wound skin color is noted with erythema which is circumferential. Periwound temperature was noted as No Abnormality. The periwound has tenderness on palpation. KAMIYAH, KINDEL ([id number]) Assessment Active Problems ICD-10 L89.153 - Pressure ulcer of sacral region, stage 3 G20 - Parkinson's disease Plan Wound Cleansing: Wound #1 Midline  Coccyx: Clean wound with Normal Saline. May Shower, gently pat wound dry prior to applying new dressing. Anesthetic: Wound #1 Midline Coccyx: Topical Lidocaine 4% cream applied to wound bed prior to debridement - in clinic Skin Barriers/Peri-Wound Care: Wound #1 Midline Coccyx: Skin Prep Primary Wound Dressing: Wound #1 Midline Coccyx: Prisma Ag - or collagen with silver equivalent Secondary Dressing: Wound #1 Midline Coccyx: Dry Gauze Boardered Foam Dressing - silicone bordered dressing due to skin breakdown Dressing Change Frequency: Wound #1 Midline Coccyx: Change dressing every day. Follow-up Appointments: Wound #1 Midline Coccyx: Return Appointment in 1 week. Off-Loading: Wound #1 Midline Coccyx: Roho cushion for wheelchair - HHRN please order roho cushion or gel cushion for patients wheelchair - whichever she qualifies for Turn and reposition every 2 hours Additional Orders / Instructions: Wound #1 Midline Coccyx: Increase protein intake. - please add protein supplements to patients diet Other: - please add vitamin A, vitamin C and zinc supplements to patients diet Home Health: DYAMON, SOSINSKI ([id number]) Wound #1 Midline Coccyx: Continue Home Health Visits Home Health Nurse may visit PRN to address patient s wound care needs. FACE TO FACE ENCOUNTER: MEDICARE and MEDICAID PATIENTS: I certify that this patient is under my care and that I had a face-to-face encounter that meets the physician face-to-face encounter requirements with this patient on this date. The encounter with the patient was in whole or in part for the following MEDICAL CONDITION: (primary  reason for Home Healthcare) MEDICAL NECESSITY: I certify, that based on my findings, NURSING services are a medically necessary home health service. HOME BOUND STATUS: I certify that my clinical findings support that this patient is homebound (i.e., Due to illness or injury, pt requires aid of supportive devices  such as crutches, cane, wheelchairs, walkers, the use of special transportation or the assistance of another person to leave their place of residence. There is a normal inability to leave the home and doing so requires considerable and taxing effort. Other absences are for medical reasons / religious services and are infrequent or of short duration when for other reasons). If current dressing causes regression in wound condition, may D/C ordered dressing product/s and apply Normal Saline Moist Dressing daily until next Wound Healing Center / Other MD appointment. Notify Wound Healing Center of regression in wound condition at [phone]. Please direct any NON-WOUND related issues/requests for orders to patient's Primary Care Physician - Dr Vonita MossMark Crissman 1 I change from the Dakin's to silver collagen. They're convinced she had some reaction to an alginate or perhaps silver although I am not convinced about it #2 the Augmentin and the ciprofloxacin will finish on Friday, we'll look at this next week. If she requires more protracted antibiotics this may be a case where parenteral antibiotics might be beneficial #3 the improvement with oral antibiotics would mitigate against underlying osteomyelitis although this remains a possibility Electronic Signature(s) Signed: 05/26/2017 10:39:17 AM By: Baltazar Najjarobson, Michael MD Entered By: Baltazar Najjarobson, Michael on 05/26/2017 10:39:17 Elmarie MainlandSHAW, Kimela W. ([id number]) -------------------------------------------------------------------------------- SuperBill Details Patient Name: Elmarie MainlandSHAW, Akirah W. Date of Service: 05/26/2017 Medical Record Patient Account Number: [id number] Number: Treating RN: Curtis SitesDorthy, Joanna [birth year] (83 y.o. Other Clinician: Date of Birth/Sex: Female) Treating ROBSON, MICHAEL Primary Care Provider: Vonita MossRISSMAN, MARK Provider/Extender: G Referring Provider: Vonita MossRISSMAN, MARK Weeks in Treatment: 19 Diagnosis Coding ICD-10 Codes Code  Description L89.153 Pressure ulcer of sacral region, stage 3 G20 Parkinson's disease Facility Procedures CPT4 Code: [id number] Description: 99213 - WOUND CARE VISIT-LEV 3 EST PT Modifier: Quantity: 1 Physician Procedures CPT4 Code: [id number] Description: 99213 - WC PHYS LEVEL 3 - EST PT ICD-10 Description Diagnosis L89.153 Pressure ulcer of sacral region, stage 3 Modifier: Quantity: 1 Electronic Signature(s) Signed: 05/26/2017 2:25:12 PM By: Curtis Sitesorthy, Joanna Signed: 05/27/2017 7:49:24 AM By: Baltazar Najjarobson, Michael MD Entered By: Curtis Sitesorthy, Joanna on 05/26/2017 14:25:11

## 2017-05-28 NOTE — Progress Notes (Signed)
PERSEPHONE, SCHRIEVER ([id number]) Visit Report for 05/26/2017 Arrival Information Details Patient Name: [name], [name] Date of Service: 05/26/2017 10:15 AM Medical Record Patient Account Number: [id number] Number: Treating RN: Curtis Sites 1923-02-21 (83 y.o. Other Clinician: Date of Birth/Sex: Female) Treating ROBSON, MICHAEL Primary Care Celestine Prim: Vonita Moss Emerly Prak/Extender: G Referring Obie Kallenbach: Vonita Moss Weeks in Treatment: 19 Visit Information History Since Last Visit Added or deleted any medications: No Patient Arrived: Wheel Chair Any new allergies or adverse reactions: No Arrival Time: 09:57 Had a fall or experienced change in No activities of daily living that may affect Accompanied By: dtr risk of falls: Transfer Assistance: Manual Signs or symptoms of abuse/neglect since last No Patient Identification Verified: Yes visito Secondary Verification Process Yes Hospitalized since last visit: No Completed: Has Dressing in Place as Prescribed: Yes Patient Requires Transmission-Based No Pain Present Now: No Precautions: Patient Has Alerts: No Electronic Signature(s) Signed: 05/26/2017 5:05:53 PM By: Curtis Sites Entered By: Curtis Sites on 05/26/2017 09:58:09 [name] ([id number]) -------------------------------------------------------------------------------- Clinic Level of Care Assessment Details Patient Name: [name] Date of Service: 05/26/2017 10:15 AM Medical Record Patient Account Number: [id number] Number: Treating RN: Curtis Sites [birth year] (83 y.o. Other Clinician: Date of Birth/Sex: Female) Treating ROBSON, MICHAEL Primary Care Joyous Gleghorn: Vonita Moss Gavon Majano/Extender: G Referring Julya Alioto: Vonita Moss Weeks in Treatment: 19 Clinic Level of Care Assessment Items TOOL 4 Quantity Score []  - Use when only an EandM is performed on FOLLOW-UP visit 0 ASSESSMENTS - Nursing Assessment /  Reassessment X - Reassessment of Co-morbidities (includes updates in patient status) 1 10 X - Reassessment of Adherence to Treatment Plan 1 5 ASSESSMENTS - Wound and Skin Assessment / Reassessment X - Simple Wound Assessment / Reassessment - one wound 1 5 []  - Complex Wound Assessment / Reassessment - multiple wounds 0 []  - Dermatologic / Skin Assessment (not related to wound area) 0 ASSESSMENTS - Focused Assessment []  - Circumferential Edema Measurements - multi extremities 0 []  - Nutritional Assessment / Counseling / Intervention 0 []  - Lower Extremity Assessment (monofilament, tuning fork, pulses) 0 []  - Peripheral Arterial Disease Assessment (using hand held doppler) 0 ASSESSMENTS - Ostomy and/or Continence Assessment and Care X - Incontinence Assessment and Management 1 10 []  - Ostomy Care Assessment and Management (repouching, etc.) 0 PROCESS - Coordination of Care X - Simple Patient / Family Education for ongoing care 1 15 []  - Complex (extensive) Patient / Family Education for ongoing care 0 []  - Staff obtains Chiropractor, Records, Test Results / Process Orders 0 []  - Staff telephones HHA, Nursing Homes / Clarify orders / etc 0 [name], [name] ([id number]) []  - Routine Transfer to another Facility (non-emergent condition) 0 []  - Routine Hospital Admission (non-emergent condition) 0 []  - New Admissions / Manufacturing engineer / Ordering NPWT, Apligraf, etc. 0 []  - Emergency Hospital Admission (emergent condition) 0 X - Simple Discharge Coordination 1 10 []  - Complex (extensive) Discharge Coordination 0 PROCESS - Special Needs []  - Pediatric / Minor Patient Management 0 []  - Isolation Patient Management 0 []  - Hearing / Language / Visual special needs 0 []  - Assessment of Community assistance (transportation, D/C planning, etc.) 0 X - Additional assistance / Altered mentation 1 15 []  - Support Surface(s) Assessment (bed, cushion, seat, etc.) 0 INTERVENTIONS - Wound Cleansing  / Measurement X - Simple Wound Cleansing - one wound 1 5 []  - Complex Wound Cleansing - multiple wounds 0 X - Wound Imaging (photographs - any number of wounds)  1 5 []  - Wound Tracing (instead of photographs) 0 X - Simple Wound Measurement - one wound 1 5 []  - Complex Wound Measurement - multiple wounds 0 INTERVENTIONS - Wound Dressings X - Small Wound Dressing one or multiple wounds 1 10 []  - Medium Wound Dressing one or multiple wounds 0 []  - Large Wound Dressing one or multiple wounds 0 []  - Application of Medications - topical 0 []  - Application of Medications - injection 0 [name], [name]. ([id number]) INTERVENTIONS - Miscellaneous []  - External ear exam 0 []  - Specimen Collection (cultures, biopsies, blood, body fluids, etc.) 0 []  - Specimen(s) / Culture(s) sent or taken to Lab for analysis 0 []  - Patient Transfer (multiple staff / Michiel Sites Lift / Similar devices) 0 []  - Simple Staple / Suture removal (25 or less) 0 []  - Complex Staple / Suture removal (26 or more) 0 []  - Hypo / Hyperglycemic Management (close monitor of Blood Glucose) 0 []  - Ankle / Brachial Index (ABI) - do not check if billed separately 0 X - Vital Signs 1 5 Has the patient been seen at the hospital within the last three years: Yes Total Score: 100 Level Of Care: New/Established - Level 3 Electronic Signature(s) Signed: 05/26/2017 5:05:53 PM By: Curtis Sites Entered By: Curtis Sites on 05/26/2017 14:25:04 [name] ([id number]) -------------------------------------------------------------------------------- Encounter Discharge Information Details Patient Name: [name] Date of Service: 05/26/2017 10:15 AM Medical Record Patient Account Number: [id number] Number: Treating RN: Curtis Sites 12-Jun-1923 (83 y.o. Other Clinician: Date of Birth/Sex: Female) Treating ROBSON, MICHAEL Primary Care Bryssa Tones: Vonita Moss Vishwa Dais/Extender: G Referring Preslie Depasquale: Vonita Moss Weeks in Treatment: 72 Encounter Discharge Information Items Discharge Pain Level: 0 Discharge Condition: Stable Ambulatory Status: Wheelchair Discharge Destination: Home Transportation: Private Auto Accompanied By: dtr and cg Schedule Follow-up Appointment: Yes Medication Reconciliation completed and provided to Patient/Care No Jehan Ranganathan: Provided on Clinical Summary of Care: 05/26/2017 Form Type Recipient Paper Patient ES Electronic Signature(s) Signed: 05/27/2017 9:32:52 AM By: Gwenlyn Perking Entered By: Gwenlyn Perking on 05/26/2017 10:41:50 [name] ([id number]) -------------------------------------------------------------------------------- Multi Wound Chart Details Patient Name: [name] Date of Service: 05/26/2017 10:15 AM Medical Record Patient Account Number: [id number] Number: Treating RN: Curtis Sites 07/19/1923 (83 y.o. Other Clinician: Date of Birth/Sex: Female) Treating ROBSON, MICHAEL Primary Care Isla Sabree: Vonita Moss Delories Mauri/Extender: G Referring Jamiel Goncalves: Vonita Moss Weeks in Treatment: 19 Vital Signs Height(in): Pulse(bpm): 116 Weight(lbs): Blood Pressure (mmHg): Body Mass Index(BMI): Temperature(F): 98.2 Respiratory Rate 16 (breaths/min): Photos: [1:No Photos] [N/A:N/A] Wound Location: [1:Coccyx - Midline] [N/A:N/A] Wounding Event: [1:Pressure Injury] [N/A:N/A] Primary Etiology: [1:Pressure Ulcer] [N/A:N/A] Comorbid History: [1:Anemia, Hypertension, Dementia] [N/A:N/A] Date Acquired: [1:12/01/2016] [N/A:N/A] Weeks of Treatment: [1:19] [N/A:N/A] Wound Status: [1:Open] [N/A:N/A] Measurements L x W x D 5.3x5x1.8 [N/A:N/A] (cm) Area (cm) : [1:20.813] [N/A:N/A] Volume (cm) : [1:37.463] [N/A:N/A] % Reduction in Area: [1:-400.90%] [N/A:N/A] % Reduction in Volume: -2154.10% [N/A:N/A] Classification: [1:Category/Stage III] [N/A:N/A] Exudate Amount: [1:Large] [N/A:N/A] Exudate Type: [1:Purulent]  [N/A:N/A] Exudate Color: [1:yellow, brown, green] [N/A:N/A] Foul Odor After [1:Yes] [N/A:N/A] Cleansing: Odor Anticipated Due to No [N/A:N/A] Product Use: Wound Margin: [1:Flat and Intact] [N/A:N/A] Granulation Amount: [1:Small (1-33%)] [N/A:N/A] Granulation Quality: [1:Red] [N/A:N/A] Necrotic Amount: [1:Large (67-100%)] [N/A:N/A] Necrotic Tissue: [1:Eschar, Adherent Slough] [N/A:N/A] Exposed Structures: Fat Layer (Subcutaneous N/A N/A Tissue) Exposed: Yes Fascia: No Tendon: No Muscle: No Joint: No Bone: No Epithelialization: None N/A N/A Periwound Skin Texture: Induration: Yes N/A N/A Excoriation: No Callus: No Crepitus: No Rash: No Scarring: No Periwound  Skin Maceration: No N/A N/A Moisture: Dry/Scaly: No Periwound Skin Color: Erythema: Yes N/A N/A Atrophie Blanche: No Cyanosis: No Ecchymosis: No Hemosiderin Staining: No Mottled: No Pallor: No Rubor: No Erythema Location: Circumferential N/A N/A Temperature: No Abnormality N/A N/A Tenderness on Yes N/A N/A Palpation: Wound Preparation: Ulcer Cleansing: N/A N/A Rinsed/Irrigated with Saline Topical Anesthetic Applied: Other: lidocaine 4% Treatment Notes Electronic Signature(s) Signed: 05/27/2017 7:49:24 AM By: Baltazar Najjarobson, Michael MD Entered By: Baltazar Najjarobson, Michael on 05/26/2017 10:33:23 Tina MainlandSHAW, [name] ([id number]) -------------------------------------------------------------------------------- Multi-Disciplinary Care Plan Details Patient Name: Tina MainlandSHAW, [name] Date of Service: 05/26/2017 10:15 AM Medical Record Patient Account Number: [id number] Number: Treating RN: Curtis SitesDorthy, Joanna [birth year] (83 y.o. Other Clinician: Date of Birth/Sex: Female) Treating ROBSON, MICHAEL Primary Care Broc Caspers: Vonita MossRISSMAN, MARK Keywon Mestre/Extender: G Referring Vashon Riordan: Vonita MossRISSMAN, MARK Weeks in Treatment: 7619 Active Inactive ` Abuse / Safety / Falls / Self Care Management Nursing Diagnoses: Impaired physical  mobility Potential for falls Goals: Patient will remain injury free Date Initiated: 01/07/2017 Target Resolution Date: 04/03/2017 Goal Status: Active Interventions: Assess fall risk on admission and as needed Notes: ` Nutrition Nursing Diagnoses: Potential for alteratiion in Nutrition/Potential for imbalanced nutrition Goals: Patient/caregiver agrees to and verbalizes understanding of need to use nutritional supplements and/or vitamins as prescribed Date Initiated: 01/07/2017 Target Resolution Date: 04/03/2017 Goal Status: Active Interventions: Assess patient nutrition upon admission and as needed per policy Notes: ` Orientation to the Wound Care Program Tina MainlandSHAW, [name] ([id number]) Nursing Diagnoses: Knowledge deficit related to the wound healing center program Goals: Patient/caregiver will verbalize understanding of the Wound Healing Center Program Date Initiated: 01/07/2017 Target Resolution Date: 04/03/2017 Goal Status: Active Interventions: Provide education on orientation to the wound center Notes: ` Pressure Nursing Diagnoses: Knowledge deficit related to causes and risk factors for pressure ulcer development Goals: Patient will remain free from development of additional pressure ulcers Date Initiated: 01/07/2017 Target Resolution Date: 04/03/2017 Goal Status: Active Interventions: Assess potential for pressure ulcer upon admission and as needed Notes: ` Wound/Skin Impairment Nursing Diagnoses: Impaired tissue integrity Goals: Patient/caregiver will verbalize understanding of skin care regimen Date Initiated: 01/07/2017 Target Resolution Date: 04/03/2017 Goal Status: Active Ulcer/skin breakdown will have a volume reduction of 30% by week 4 Date Initiated: 01/07/2017 Target Resolution Date: 04/03/2017 Goal Status: Active Ulcer/skin breakdown will have a volume reduction of 50% by week 8 Date Initiated: 01/07/2017 Target Resolution Date: 04/03/2017 Goal Status:  Active Ulcer/skin breakdown will have a volume reduction of 80% by week 12 Tina MainlandSHAW, [name] ([id number]) Date Initiated: 01/07/2017 Target Resolution Date: 04/03/2017 Goal Status: Active Ulcer/skin breakdown will heal within 14 weeks Date Initiated: 01/07/2017 Target Resolution Date: 04/03/2017 Goal Status: Active Interventions: Assess patient/caregiver ability to obtain necessary supplies Assess patient/caregiver ability to perform ulcer/skin care regimen upon admission and as needed Assess ulceration(s) every visit Notes: Electronic Signature(s) Signed: 05/26/2017 5:05:53 PM By: Curtis Sitesorthy, Joanna Entered By: Curtis Sitesorthy, Joanna on 05/26/2017 10:20:48 Tina MainlandSHAW, [name] ([id number]) -------------------------------------------------------------------------------- Pain Assessment Details Patient Name: Tina MainlandSHAW, Kaiyah W. Date of Service: 05/26/2017 10:15 AM Medical Record Patient Account Number: [id number] Number: Treating RN: Curtis Sitesorthy, Joanna [birth year] (83 y.o. Other Clinician: Date of Birth/Sex: Female) Treating ROBSON, MICHAEL Primary Care Brigett Estell: Vonita MossRISSMAN, MARK Josephyne Tarter/Extender: G Referring Chrystie Hagwood: Vonita MossRISSMAN, MARK Weeks in Treatment: 19 Active Problems Location of Pain Severity and Description of Pain Patient Has Paino Patient Unable to Respond Site Locations Pain Management and Medication Current Pain Management: Electronic Signature(s) Signed: 05/26/2017 5:05:53 PM By: Curtis Sitesorthy, Joanna Entered By: Curtis Sitesorthy, Joanna on 05/26/2017 09:58:58 [name], [name] (  [id number]) -------------------------------------------------------------------------------- Patient/Caregiver Education Details Patient Name: [name] Date of Service: 05/26/2017 10:15 AM Medical Record Patient Account Number: [id number] Number: Treating RN: Curtis Sites [birth year] (83 y.o. Other Clinician: Date of Birth/Gender: Female) Treating ROBSON, MICHAEL Primary Care Physician: Vonita Moss Physician/Extender: G Referring Physician: Veverly Fells in Treatment: 49 Education Assessment Education Provided To: Caregiver Education Topics Provided Wound/Skin Impairment: Handouts: Other: wound care as ordered Methods: Demonstration, Explain/Verbal Responses: State content correctly Electronic Signature(s) Signed: 05/26/2017 5:05:53 PM By: Curtis Sites Entered By: Curtis Sites on 05/26/2017 10:05:53 [name] ([id number]) -------------------------------------------------------------------------------- Wound Assessment Details Patient Name: [name] Date of Service: 05/26/2017 10:15 AM Medical Record Patient Account Number: [id number] Number: Treating RN: Curtis Sites 05/01/1923 (83 y.o. Other Clinician: Date of Birth/Sex: Female) Treating ROBSON, MICHAEL Primary Care Devesh Monforte: Vonita Moss Kylle Lall/Extender: G Referring Genell Thede: Vonita Moss Weeks in Treatment: 19 Wound Status Wound Number: 1 Primary Etiology: Pressure Ulcer Wound Location: Coccyx - Midline Wound Status: Open Wounding Event: Pressure Injury Comorbid History: Anemia, Hypertension, Dementia Date Acquired: 12/01/2016 Weeks Of Treatment: 19 Clustered Wound: No Photos Photo Uploaded By: Curtis Sites on 05/26/2017 12:46:12 Wound Measurements Length: (cm) 5.3 Width: (cm) 5 Depth: (cm) 1.8 Area: (cm) 20.813 Volume: (cm) 37.463 % Reduction in Area: -400.9% % Reduction in Volume: -2154.1% Epithelialization: None Tunneling: No Undermining: No Wound Description Classification: Category/Stage III Wound Margin: Flat and Intact Exudate Amount: Large Exudate Type: Purulent Exudate Color: yellow, brown, green Foul Odor After Cleansing: Yes Due to Product Use: No Slough/Fibrino Yes Wound Bed Granulation Amount: Small (1-33%) Exposed Structure Granulation Quality: Red Fascia Exposed: No Necrotic Amount: Large (67-100%) Fat Layer (Subcutaneous  Tissue) Exposed: Yes YARIANA, HOAGLUND ([id number]) Necrotic Quality: Eschar, Adherent Slough Tendon Exposed: No Muscle Exposed: No Joint Exposed: No Bone Exposed: No Periwound Skin Texture Texture Color No Abnormalities Noted: No No Abnormalities Noted: No Callus: No Atrophie Blanche: No Crepitus: No Cyanosis: No Excoriation: No Ecchymosis: No Induration: Yes Erythema: Yes Rash: No Erythema Location: Circumferential Scarring: No Hemosiderin Staining: No Mottled: No Moisture Pallor: No No Abnormalities Noted: No Rubor: No Dry / Scaly: No Maceration: No Temperature / Pain Temperature: No Abnormality Tenderness on Palpation: Yes Wound Preparation Ulcer Cleansing: Rinsed/Irrigated with Saline Topical Anesthetic Applied: Other: lidocaine 4%, Treatment Notes Wound #1 (Midline Coccyx) 1. Cleansed with: Clean wound with Normal Saline 2. Anesthetic Topical Lidocaine 4% cream to wound bed prior to debridement 3. Peri-wound Care: Skin Prep 4. Dressing Applied: Prisma Ag 5. Secondary Dressing Applied Bordered Foam Dressing Dry Gauze Electronic Signature(s) Signed: 05/26/2017 5:05:53 PM By: Curtis Sites Entered By: Curtis Sites on 05/26/2017 10:12:58 [name] ([id number]) -------------------------------------------------------------------------------- Vitals Details Patient Name: [name] Date of Service: 05/26/2017 10:15 AM Medical Record Patient Account Number: [id number] Number: Treating RN: Curtis Sites [birth year] (83 y.o. Other Clinician: Date of Birth/Sex: Female) Treating ROBSON, MICHAEL Primary Care Genieve Ramaswamy: Vonita Moss Stylianos Stradling/Extender: G Referring Tonie Vizcarrondo: Vonita Moss Weeks in Treatment: 19 Vital Signs Time Taken: 10:02 Temperature (F): 98.2 Pulse (bpm): 116 Respiratory Rate (breaths/min): 16 Reference Range: 80 - 120 mg / dl Electronic Signature(s) Signed: 05/26/2017 5:05:53 PM By: Curtis Sites Entered  By: Curtis Sites on 05/26/2017 10:02:51

## 2017-06-02 ENCOUNTER — Encounter: Payer: Medicare Other | Attending: Internal Medicine | Admitting: Internal Medicine

## 2017-06-02 DIAGNOSIS — L89153 Pressure ulcer of sacral region, stage 3: Secondary | ICD-10-CM | POA: Insufficient documentation

## 2017-06-02 DIAGNOSIS — G20 Parkinson's disease: Secondary | ICD-10-CM | POA: Insufficient documentation

## 2017-06-02 DIAGNOSIS — F0390 Unspecified dementia without behavioral disturbance: Secondary | ICD-10-CM | POA: Insufficient documentation

## 2017-06-02 IMAGING — DX DG CHEST 1V PORT
1 series · 1 of 1 positions shown · non-contrast
Comparison: Previous examinations, the most recent dated
10/29/2015.

CLINICAL DATA: Worsening generalized weakness and increased
confusion over the past week.

EXAM:
PORTABLE CHEST 1 VIEW

[chest ap]
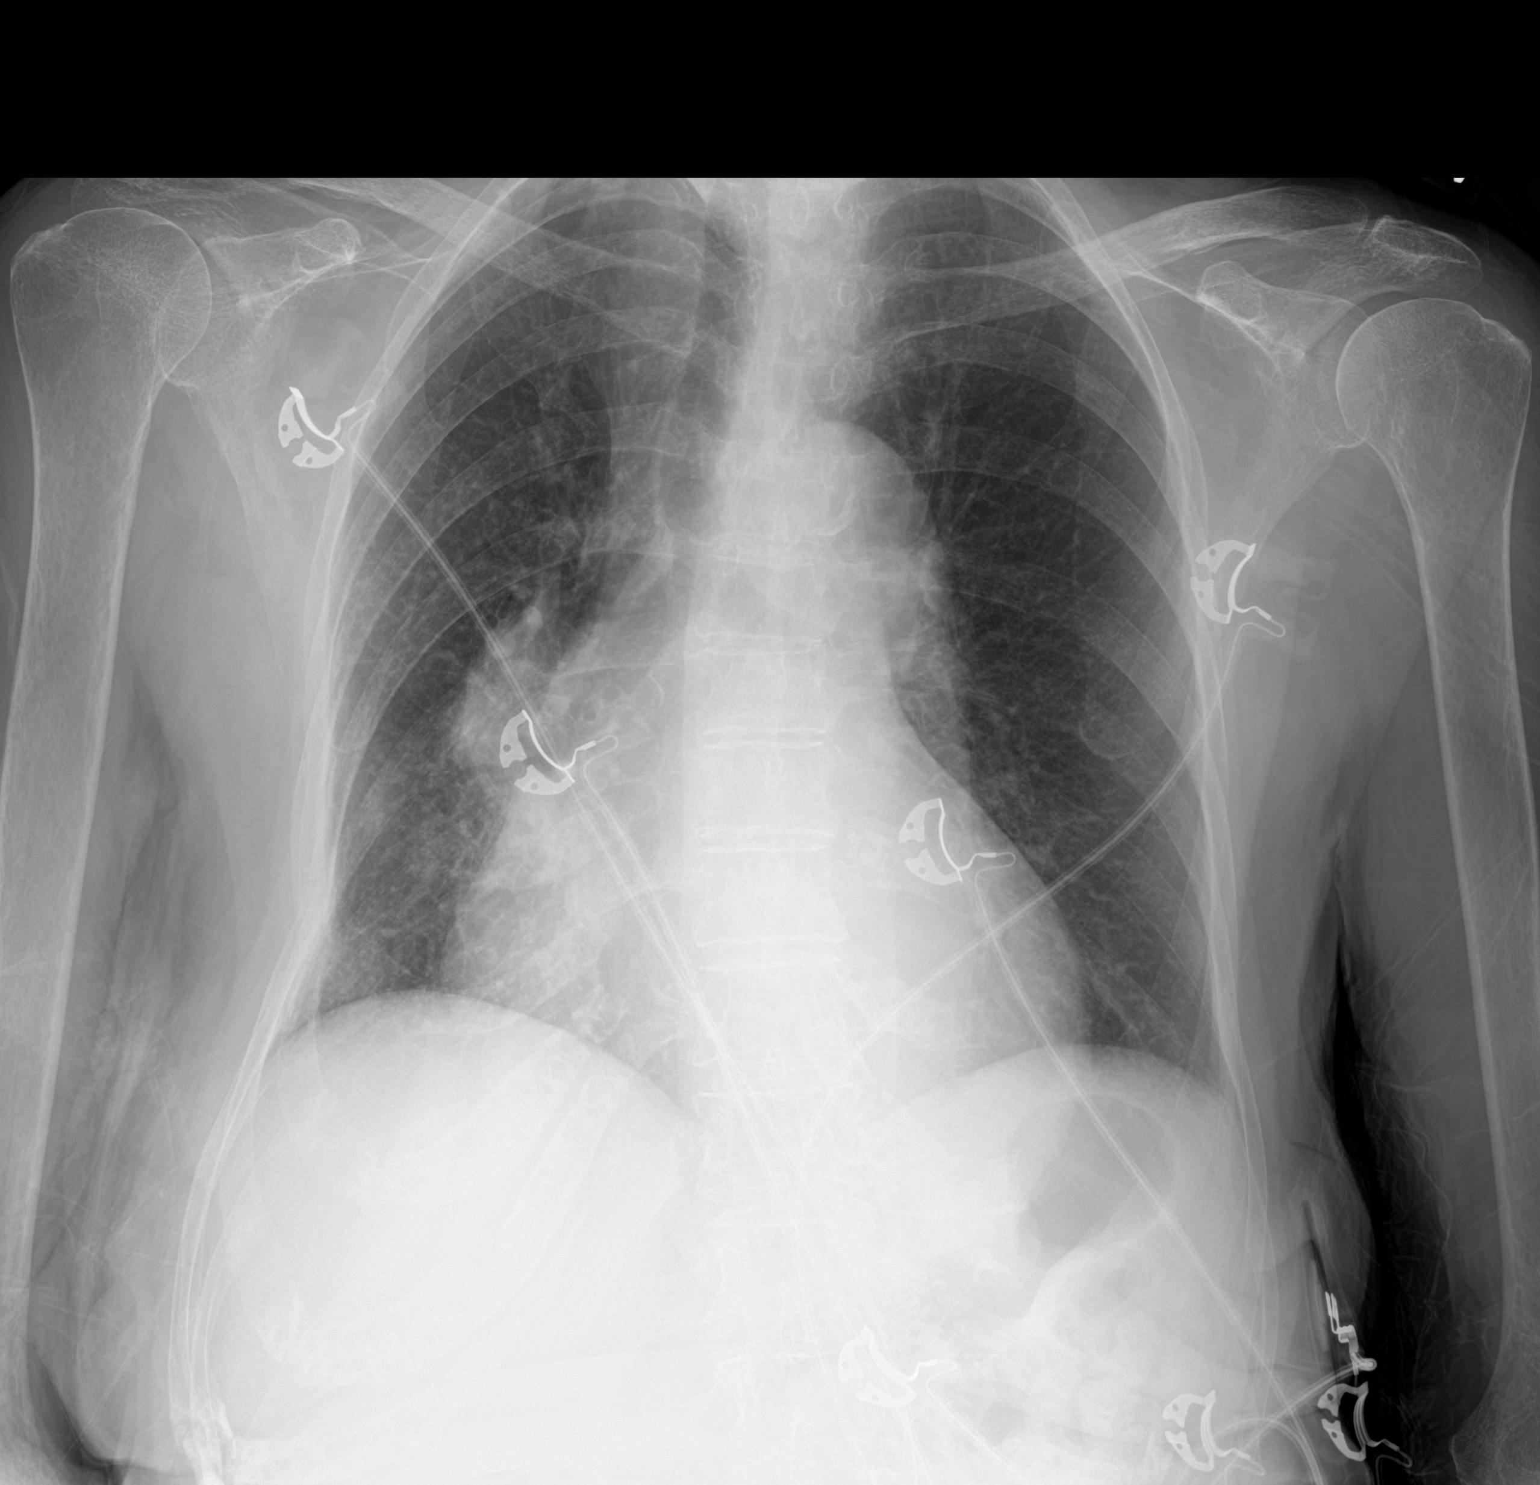

[1 of 1 positions shown; findings below may reference images not displayed]

FINDINGS: Stable enlarged cardiac silhouette. The right hilum has a prominent,
oval configuration laterally. This is asymmetrical compared to the
left hilum. Clear lungs. Minimally prominent interstitial markings.
No pleural fluid. Diffuse osteopenia.
IMPRESSION: 1. Possible right hilar mass. Further evaluation with PA and lateral
chest radiographs is recommended when patient is able.
2. Stable cardiomegaly and minimal chronic interstitial lung
disease.

## 2017-06-02 IMAGING — CT CT CHEST W/ CM
1 series · 15 of 33 positions shown, 19 images · IV contrast (omnipaque)
Comparison: Previous chest radiographs, including earlier today.

CLINICAL DATA: Possible right hilar mass on a portable chest
earlier today.

EXAM:
CT CHEST WITH CONTRAST
TECHNIQUE: Multidetector CT imaging of the chest was performed during
intravenous contrast administration.
CONTRAST:  50mL OMNIPAQUE IOHEXOL 300 MG/ML  SOLN

[Series 2: routine chest with · axial · 0.50mm/px · z∈[-212,+12]mm · 15 of 53 slices shown, 19 images]
[im 4/53  mediastinal]
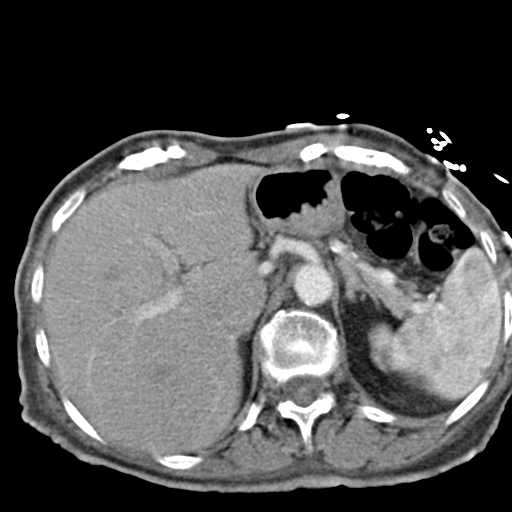
[im 4/53  lung]
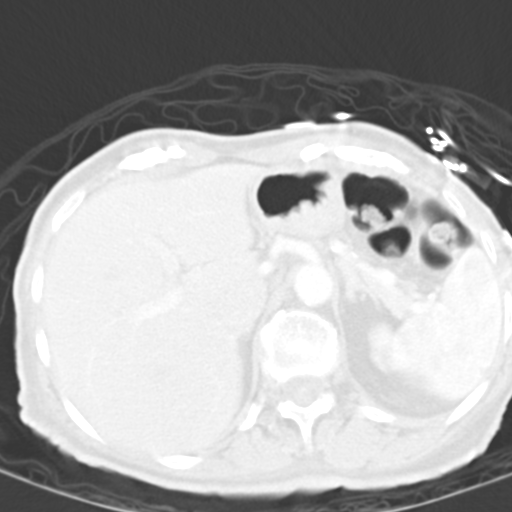
[im 8/53  lung]
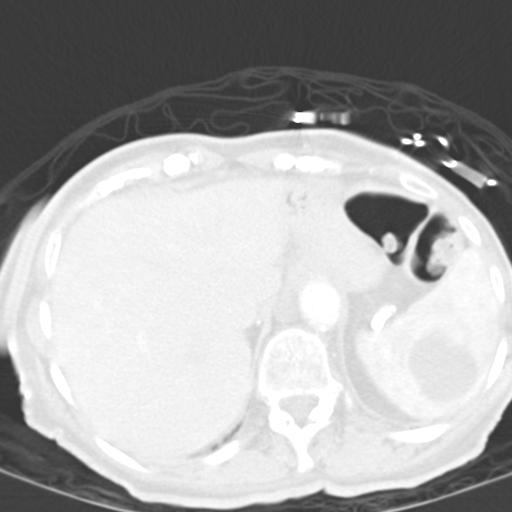
[im 11/53  lung]
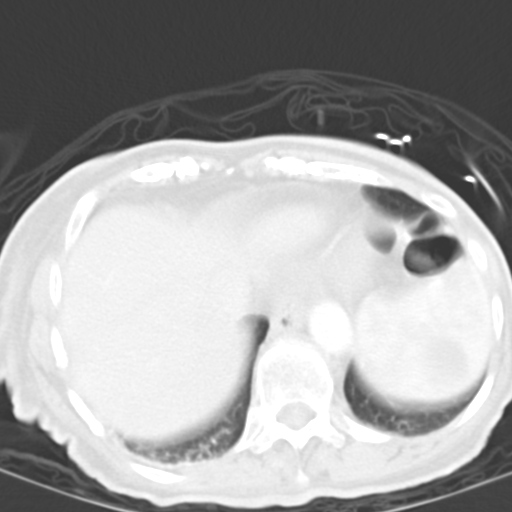
[im 14/53  lung]
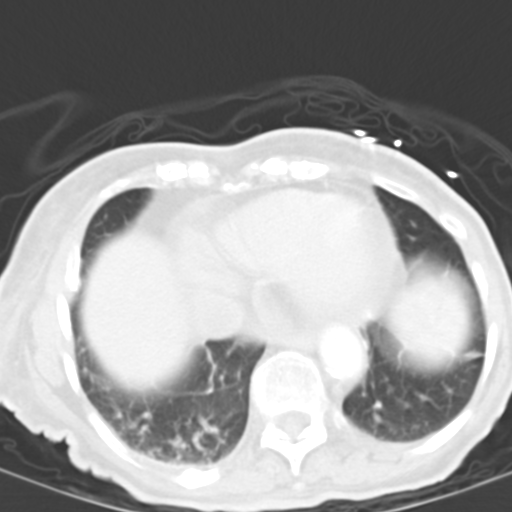
[im 18/53  mediastinal]
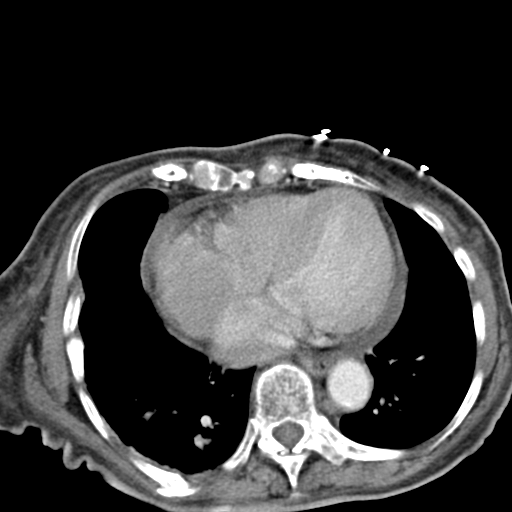
[im 18/53  lung]
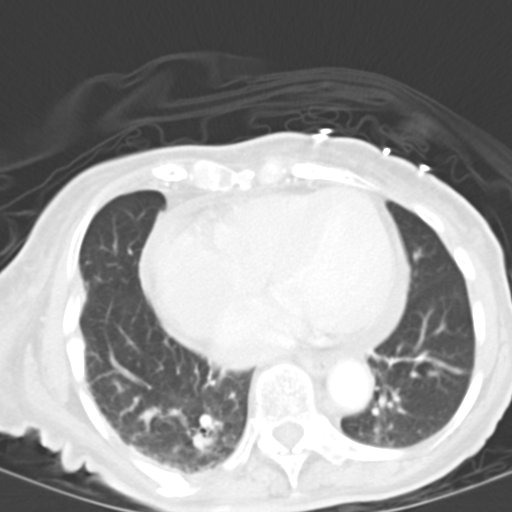
[im 21/53  lung]
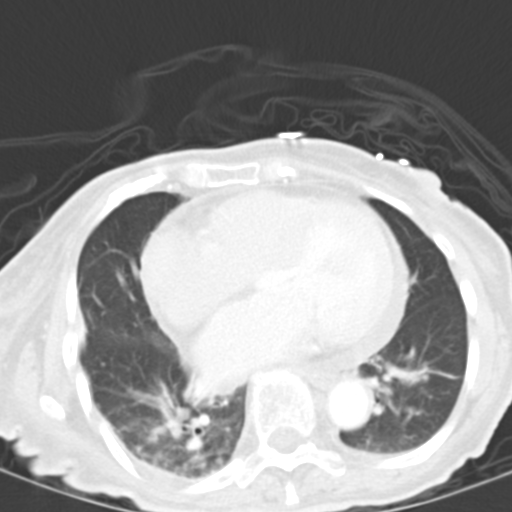
[im 24/53  lung]
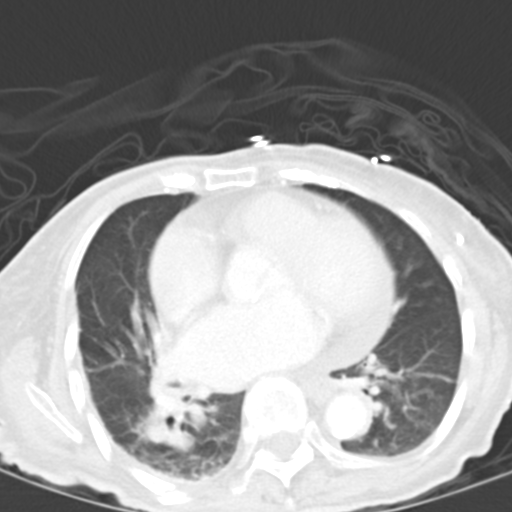
[im 26/53  lung]
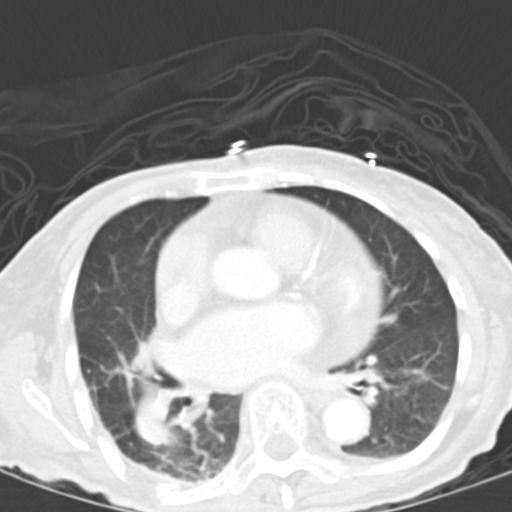
[im 29/53  mediastinal]
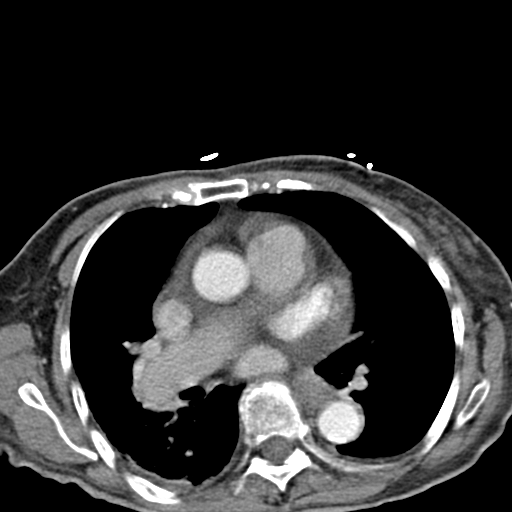
[im 29/53  lung]
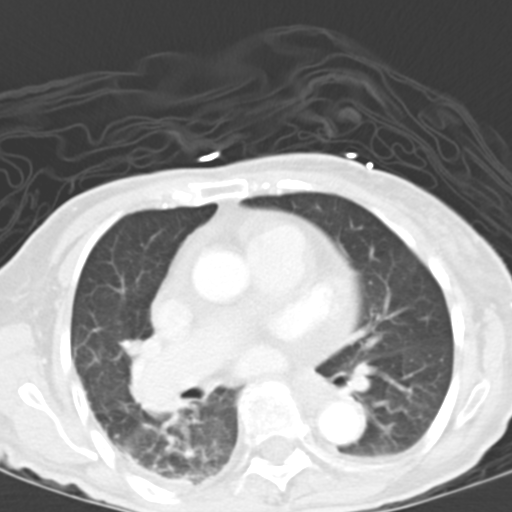
[im 32/53  lung]
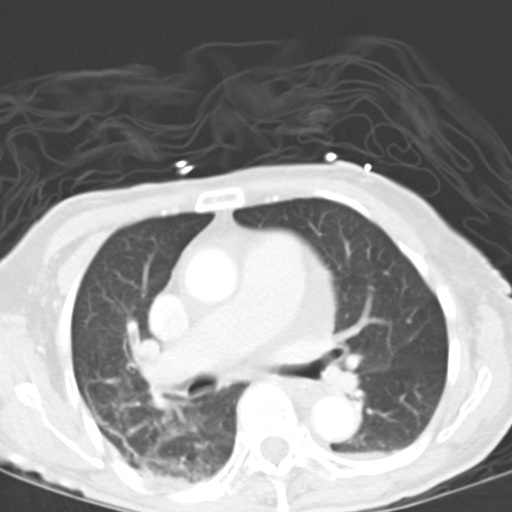
[im 35/53  lung]
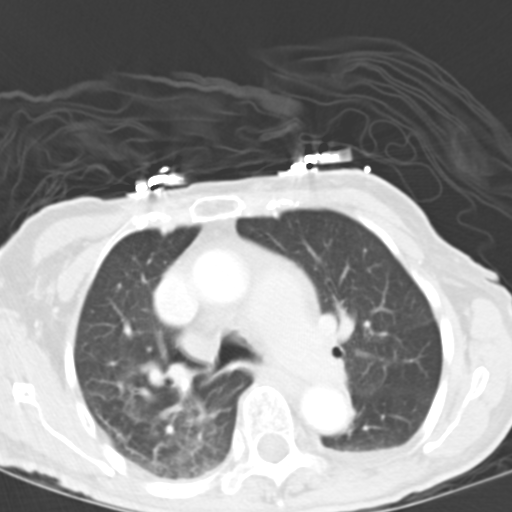
[im 39/53  lung]
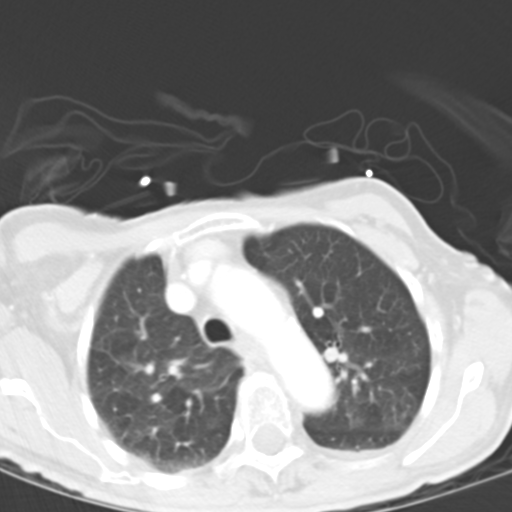
[im 42/53  mediastinal]
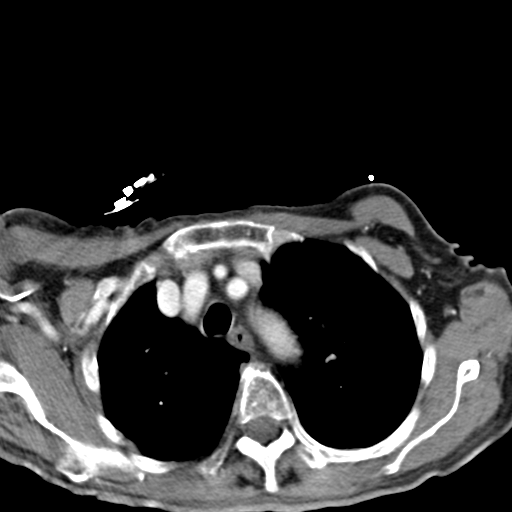
[im 42/53  lung]
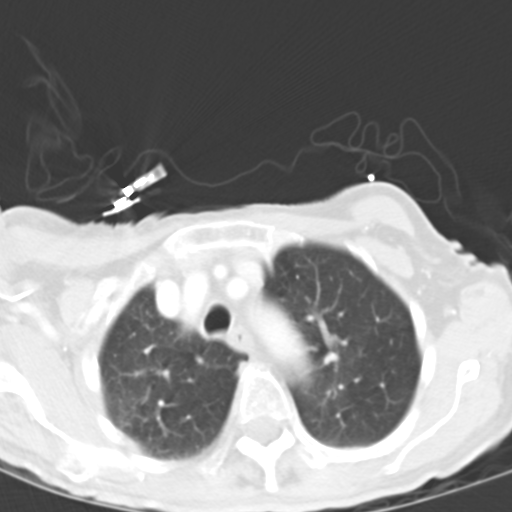
[im 45/53  lung]
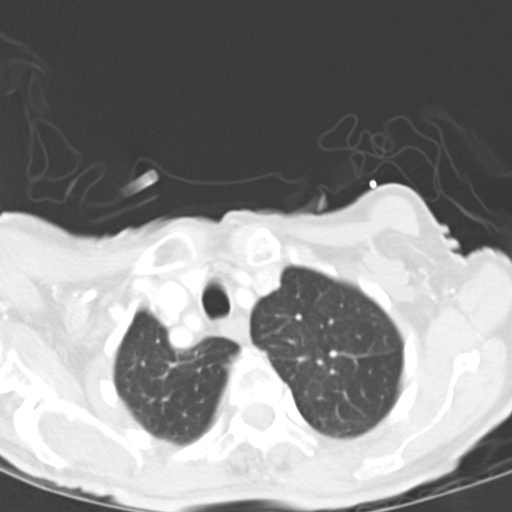
[im 49/53  lung]
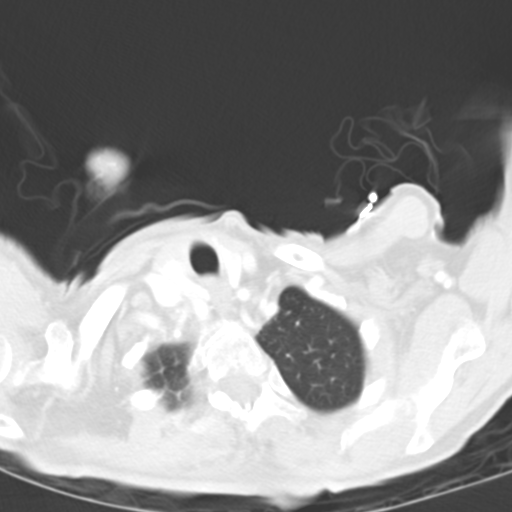

[15 of 33 positions shown; findings below may reference images not displayed]

FINDINGS: Mediastinum/Lymph Nodes: Pericardial effusion with a maximum
thickness of 9 mm. Dense atheromatous coronary artery
calcifications. The recently suspected right hilar mass is due to
right pulmonary artery prominence. No true mass or enlarged lymph
nodes seen.

Lungs/Pleura: Mild patchy opacity in the posterior aspect of the
right lower lobe. Minimal patchy opacity in the left lower lobe.
Small amount of linear scarring or atelectasis in both lower lobes.
No pleural fluid.

Upper abdomen: 3.7 cm splenic cyst. Mild scarring of the upper pole
of the left kidney.

Musculoskeletal: Partially healed right posterior seventh, eighth,
ninth and tenth rib fractures. Healed right lateral and
posterolateral fifth, sixth, seventh, eighth and ninth rib
fractures. Mild thoracic spine degenerative changes.
IMPRESSION: 1. The recently suspected right hilar mass is a prominent right
pulmonary artery, suggesting pulmonary arterial hypertension.
2. Mild patchy opacity in the right lower lobe and minimal patchy
opacity in the left lower lobe. This could represent pneumonia or
patchy atelectasis.
3. Pericardial effusion with a maximum thickness of 9 mm.

## 2017-06-03 NOTE — Progress Notes (Signed)
[name] ([id number]) Visit Report for 06/02/2017 HPI Details Patient Name: [name], [name] Date of Service: 06/02/2017 11:00 AM Medical Record Patient Account Number: [id number] Number: Treating RN: Curtis Sites 07-03-1923 (83 y.o. Other Clinician: Date of Birth/Sex: Female) Treating [name] Primary Care Provider: Vonita Moss Provider/Extender: G Referring Provider: Vonita Moss Weeks in Treatment: 20 History of Present Illness HPI Description: 01/07/17 this is a 83 year old woman admitted to the clinic today for review of a pressure ulcer on her lower sacrum. She is referred from her primary physician's office after being seen on 3/22 with a 3 cm pressure area. Her daughter and caretaker accompanied her today state that the area first became obvious about a month ago and his since deteriorated. They have recently got [name] a home health involved and have been using [name] to the wound. They have ordered a pressure relief surface for her mattress. They are turning her religiously. They state that she eats well and they've been forcing fluids on her. She is on a multivitamin. Looking through [facility] point last albumin I see was 4.4 on 10/28. The patient has advanced parkinsonism which looks superficially like advanced Parkinson's disease although her daughter tells me she did not ever respond to Sinemet therefore this may have another pathology with signs of parkinsonism. However I think this is largely a mute point currently. She also has dementia and is nonambulatory. Since this started they have been keeping her in bed and turning her religiously every 2 hours. She lives at home in [place] with her husband with 24/7 care giving 01/13/17 santyl change qd. Still will require further debridement. continue santyl. 01/20/17; patient's wound actually looks some better less adherent necrotic surface. There is actually visible granulation. We're using  [name] 01/27/17; better-looking surface but still a lot of necrotic tissue on the base of this wound. The periwound erythema is better than last week we are still using [name]. Her daughter tells [place] that she is still having trouble with the pressure-relief mattress through medical modalities 02/03/18; I had the patient scheduled for a two week followup however her daughter brought her in early concerned for discoloration on 2 areas of the wound circumference. We have bee using santyl 02/10/17;Better looking surface to the wound. Rim appears better suggesting better offloading. Using santyl 02/24/17; change to Silver Collegen last time. Wound appears better. 03/10/17; still using silver collagen religious offloading. Intake is satisfactory per her daughter. Dimension slightly better 03/12/2017 -- Dr. Jannetta Quint patient who had been seen 2 days ago and was doing fairly well. The patient is brought in by her daughter who noticed a new wound just above the previous wound on her sacral area and going on more to the left lateral side. She was very concerned and we asked her to get in for an opinion. 03/17/17; above is noted. The patient has developed a progressive area to the left of her original wound. This seems to this started with a ring of red skin with a more pale interior almost looking fungal. There was a rim of blister through part of the area although this did not look like zoster. They have been applying triamcinolone that was prescribed last week by Dr. Meyer Russel and the area has a fold to a linear band area which is confluent, erythematous and with obvious epidermal swelling but there is no overt tenderness or crepitus. [name], [name] ([id number]) She has lost some surface epithelium closer to the wound surface itself and now has a  more superficial wound in this area and the extending erythema goes towards the left buttock. This is well demarcated between involved in normal skin but once again does  not appear to be at all tender. If there is a contact issue here I cannot get the history out of the daughter or the caregiver that are with her. 03/24/17; the patient arrives today with the wound slightly worse slightly more drainage. The bandlike area of erythema that I treated as a possible pineal infection has improved somewhat although proximally is still has confluent erythema without overt tenderness. We have been using silver alginate since the most recent deterioration. To the bandlike degree of erythema we have been using Lotrisone cream 03/31/17; patient arrives today with the wound slightly larger, necrotic surface and surrounding erythema. The bandlike area of erythema that I treated as a possible pineal infection is less swollen but still present I've been using Lotrisone cream on that largely related to the presence of a tinea looking infection when this was first seen. We've been using silver alginate. X-ray that I ordered last week showed no acute bony abnormalities mild fecal impaction. Lab work showed a comprehensive metabolic panel that was normal including an albumin of 3.8. White count was 9.5 hemoglobin 12.3 differential count normal. C-reactive protein was less than 1 and sedimentation rate and 17. The latter 2 values does not support an ongoing bacterial infection. 04/14/17; patient arrives after a 2 week hiatus. Her wound is not in good condition. Although the base of the wound looks stable she still has an erythematous area that was apparently blistered over the weekend. This again points to the left. As our intake nurse pointed out today this is in the area where the tissue folds together and we may need to prevent try to prevent this. Lab work and x-ray that I did to 3 weeks ago were unremarkable including her albumin nevertheless she is an extremely frail condition physically. We have have been using [name] 04/22/17; I changed her to silver alginate because of the  surrounding maceration and moisture last week. The daughter did not like the way the wound looked in the middle of the week and changed her back to [place]. They're putting gauze on top of this. Thinks still using Lotrisone. 05/06/17 on evaluation today patient sacral wound appears to be doing okay and does not seem to be any worse. She is having no significant pain during evaluation today the secondary to mental status she is unable to rate or describe whether she had any pain she was not however flinching. Her daughter states that the wound does appear to be looking better to her. Still we are having difficulty with the skinfold that seems to be closing in on itself at this point. All in all I feel like she is making some good progress in the [name] seems to be the official for her. They do tell me that a refill if we are gonna continue that today. No fevers, chills, nausea, or vomiting noted at this time. 05/13/17 presents today for evaluation concerning her ongoing sacral pressure ulcer. Unfortunately she also has an area of deep tissue injury in the right Ischial region which is starting to show up. The sacral wound also continues to show signs of necrotic tissue overlying and has declined. Overall we really have not seen a significant improvement in the past several months in regard to the sacral wound and now patient is starting to develop a new wound in the right  Ischial region. Obviously this is not trending in the direction that we want to see. No fevers, chills, nausea, or vomiting noted at this time. 05/19/17; I have not seen this wound and almost a month however there is nothing really positive to say about it. Necrotic tissue over the surface which superiorly I think abuts on her sacrum. She has surrounding erythema. I would be surprised if there is not underlying osteomyelitis or soft tissue infection. 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 She apparently eats well per  description although I wonder about this looking at her. Lab work I did probably 4 weeks ago however was really quite normal including a serum albumin 05/26/17; culture I did last week grew Escherichia coli and methicillin sensitive staph aureus which should've been well covered by the Augmentin and ciprofloxacin. Indeed the erythema around the wound in the bed of the wound looks somewhat better. Her intake is still satisfactory. They now have a near fluidized bed 06/02/17; they completed the antibiotics last Friday. Using collagen. Daughter still reports eating and drinking well. There is less visualized erythema around the wound IANA, BUZAN ([id number]) Electronic Signature(s) Signed: 06/03/2017 4:30:21 AM By: Baltazar Najjar MD Entered By: Baltazar Najjar on 06/02/2017 13:38:23 [name] ([id number]) -------------------------------------------------------------------------------- Physical Exam Details Patient Name: [name] Date of Service: 06/02/2017 11:00 AM Medical Record Patient Account Number: [id number] Number: Treating RN: Curtis Sites 28-Sep-1923 (83 y.o. Other Clinician: Date of Birth/Sex: Female) Treating Shawnise Peterkin Primary Care Provider: Vonita Moss Provider/Extender: G Referring Provider: Vonita Moss Weeks in Treatment: 20 Constitutional Sitting or standing Blood Pressure is within target range for patient.. Pulse regular and within target range for patient.Marland Kitchen Respirations regular, non-labored and within target range.. Temperature is normal and within the target range for the patient.Marland Kitchen appears in no distress. Eyes Conjunctivae clear. No discharge. Respiratory Respiratory effort is easy and symmetric bilaterally. Rate is normal at rest and on room air.. Shallow breath sounds but no wheezing work of breathing is normal. Cardiovascular Heart rhythm and rate regular, without murmur or gallop.. Gastrointestinal (GI) Abdomen is soft  and non-distended without masses or tenderness. Bowel sounds active in all quadrants.. No liver or spleen enlargement or tenderness.Marland Kitchen Psychiatric Advanced dementia and no overt changes. Notes Wound exam; extensive wound over the lower sacral area and coccyx. There is undermining especially at 4:00 but no palpable bone. The surrounding erythema and tenderness is improved. The original wound circumference has 2 necrotic areas here that may eventually require debridement with a scalpel. There is no tenderness around the wound no crepitus Electronic Signature(s) Signed: 06/03/2017 4:30:21 AM By: Baltazar Najjar MD Entered By: Baltazar Najjar on 06/02/2017 13:40:36 [name] ([id number]) -------------------------------------------------------------------------------- Physician Orders Details Patient Name: [name] Date of Service: 06/02/2017 11:00 AM Medical Record Patient Account Number: [id number] Number: Treating RN: Huel Coventry [birth year] (83 y.o. Other Clinician: Date of Birth/Sex: Female) Treating Caiya Bettes Primary Care Provider: Vonita Moss Provider/Extender: G Referring Provider: Vonita Moss Weeks in Treatment: 20 Verbal / Phone Orders: No Diagnosis Coding Wound Cleansing Wound #1 Midline Coccyx o Clean wound with Normal Saline. o May Shower, gently pat wound dry prior to applying new dressing. Anesthetic Wound #1 Midline Coccyx o Topical Lidocaine 4% cream applied to wound bed prior to debridement - in clinic Skin Barriers/Peri-Wound Care Wound #1 Midline Coccyx o Skin Prep Primary Wound Dressing Wound #1 Midline Coccyx o [name] Ointment Secondary Dressing Wound #1 Midline Coccyx o Saline moistened gauze -  packed lightly into wound o Boardered Foam Dressing - silicone bordered dressing due to skin breakdown Dressing Change Frequency Wound #1 Midline Coccyx o Change dressing every day. Follow-up Appointments Wound #1  Midline Coccyx o Return Appointment in 2 weeks. Off-Loading Wound #1 Midline Coccyx [name], [name]. ([id number]) o Roho cushion for wheelchair - [facility] please order roho cushion or gel cushion for patients wheelchair - whichever she qualifies for o Turn and reposition every 2 hours Additional Orders / Instructions Wound #1 Midline Coccyx o Increase protein intake. - please add protein supplements to patients diet o Other: - please add vitamin A, vitamin C and zinc supplements to patients diet Home Health Wound #1 Midline Coccyx o Continue Home Health Visits o Home Health Nurse may visit PRN to address patientos wound care needs. o FACE TO FACE ENCOUNTER: MEDICARE and MEDICAID PATIENTS: I certify that this patient is under my care and that I had a face-to-face encounter that meets the physician face-to-face encounter requirements with this patient on this date. The encounter with the patient was in whole or in part for the following MEDICAL CONDITION: (primary reason for Home Healthcare) MEDICAL NECESSITY: I certify, that based on my findings, NURSING services are a medically necessary home health service. HOME BOUND STATUS: I certify that my clinical findings support that this patient is homebound (i.e., Due to illness or injury, pt requires aid of supportive devices such as crutches, cane, wheelchairs, walkers, the use of special transportation or the assistance of another person to leave their place of residence. There is a normal inability to leave the home and doing so requires considerable and taxing effort. Other absences are for medical reasons / religious services and are infrequent or of short duration when for other reasons). o If current dressing causes regression in wound condition, may D/C ordered dressing product/s and apply Normal Saline Moist Dressing daily until next Wound Healing Center / Other MD appointment. Notify Wound Healing Center of regression  in wound condition at [phone]. o Please direct any NON-WOUND related issues/requests for orders to patient's Primary Care Physician - Dr Vonita Moss Electronic Signature(s) Signed: 06/02/2017 12:26:56 PM By: Elliot Gurney, BSN, RN, CWS, Kim RN, BSN Signed: 06/03/2017 4:30:21 AM By: Baltazar Najjar MD Entered By: Elliot Gurney, BSN, RN, CWS, Kim on 06/02/2017 12:26:51 [name] ([id number]) -------------------------------------------------------------------------------- Problem List Details Patient Name: [name] Date of Service: 06/02/2017 11:00 AM Medical Record Patient Account Number: [id number] Number: Treating RN: Curtis Sites 05/07/1923 (83 y.o. Other Clinician: Date of Birth/Sex: Female) Treating Valary Manahan Primary Care Provider: Vonita Moss Provider/Extender: G Referring Provider: Vonita Moss Weeks in Treatment: 20 Active Problems ICD-10 Encounter Code Description Active Date Diagnosis L89.153 Pressure ulcer of sacral region, stage 3 01/07/2017 Yes G20 Parkinson's disease 01/07/2017 Yes Inactive Problems Resolved Problems Electronic Signature(s) Signed: 06/03/2017 4:30:21 AM By: Baltazar Najjar MD Entered By: Baltazar Najjar on 06/02/2017 13:36:33 [name] ([id number]) -------------------------------------------------------------------------------- Progress Note Details Patient Name: [name] Date of Service: 06/02/2017 11:00 AM Medical Record Patient Account Number: [id number] Number: Treating RN: Curtis Sites [birth year] (83 y.o. Other Clinician: Date of Birth/Sex: Female) Treating Akeria Hedstrom Primary Care Provider: Vonita Moss Provider/Extender: G Referring Provider: Vonita Moss Weeks in Treatment: 20 Subjective History of Present Illness (HPI) 01/07/17 this is a 83 year old woman admitted to the clinic today for review of a pressure ulcer on her lower sacrum. She is referred from her primary  physician's office after being seen on 3/22 with a 3 cm  pressure area. Her daughter and caretaker accompanied her today state that the area first became obvious about a month ago and his since deteriorated. They have recently got [name] a home health involved and have been using [name] to the wound. They have ordered a pressure relief surface for her mattress. They are turning her religiously. They state that she eats well and they've been forcing fluids on her. She is on a multivitamin. Looking through [facility] point last albumin I see was 4.4 on 10/28. The patient has advanced parkinsonism which looks superficially like advanced Parkinson's disease although her daughter tells me she did not ever respond to Sinemet therefore this may have another pathology with signs of parkinsonism. However I think this is largely a mute point currently. She also has dementia and is nonambulatory. Since this started they have been keeping her in bed and turning her religiously every 2 hours. She lives at home in [place] with her husband with 24/7 care giving 01/13/17 santyl change qd. Still will require further debridement. continue santyl. 01/20/17; patient's wound actually looks some better less adherent necrotic surface. There is actually visible granulation. We're using [name] 01/27/17; better-looking surface but still a lot of necrotic tissue on the base of this wound. The periwound erythema is better than last week we are still using [name]. Her daughter tells [place] that she is still having trouble with the pressure-relief mattress through medical modalities 02/03/18; I had the patient scheduled for a two week followup however her daughter brought her in early concerned for discoloration on 2 areas of the wound circumference. We have bee using santyl 02/10/17;Better looking surface to the wound. Rim appears better suggesting better offloading. Using santyl 02/24/17; change to Silver Collegen last time. Wound  appears better. 03/10/17; still using silver collagen religious offloading. Intake is satisfactory per her daughter. Dimension slightly better 03/12/2017 -- Dr. Jannetta Quint patient who had been seen 2 days ago and was doing fairly well. The patient is brought in by her daughter who noticed a new wound just above the previous wound on her sacral area and going on more to the left lateral side. She was very concerned and we asked her to get in for an opinion. 03/17/17; above is noted. The patient has developed a progressive area to the left of her original wound. This seems to this started with a ring of red skin with a more pale interior almost looking fungal. There was a rim of blister through part of the area although this did not look like zoster. They have been applying triamcinolone that was prescribed last week by Dr. Meyer Russel and the area has a fold to a linear band area which is confluent, erythematous and with obvious epidermal swelling but there is no overt tenderness or crepitus. She has lost some surface epithelium closer to the wound surface itself and now has a more superficial wound in this area and the extending erythema goes towards the left buttock. This is well demarcated [name], [name]. ([id number]) between involved in normal skin but once again does not appear to be at all tender. If there is a contact issue here I cannot get the history out of the daughter or the caregiver that are with her. 03/24/17; the patient arrives today with the wound slightly worse slightly more drainage. The bandlike area of erythema that I treated as a possible pineal infection has improved somewhat although proximally is still has confluent erythema without overt tenderness. We have been using silver alginate since  the most recent deterioration. To the bandlike degree of erythema we have been using Lotrisone cream 03/31/17; patient arrives today with the wound slightly larger, necrotic surface and  surrounding erythema. The bandlike area of erythema that I treated as a possible pineal infection is less swollen but still present I've been using Lotrisone cream on that largely related to the presence of a tinea looking infection when this was first seen. We've been using silver alginate. X-ray that I ordered last week showed no acute bony abnormalities mild fecal impaction. Lab work showed a comprehensive metabolic panel that was normal including an albumin of 3.8. White count was 9.5 hemoglobin 12.3 differential count normal. C-reactive protein was less than 1 and sedimentation rate and 17. The latter 2 values does not support an ongoing bacterial infection. 04/14/17; patient arrives after a 2 week hiatus. Her wound is not in good condition. Although the base of the wound looks stable she still has an erythematous area that was apparently blistered over the weekend. This again points to the left. As our intake nurse pointed out today this is in the area where the tissue folds together and we may need to prevent try to prevent this. Lab work and x-ray that I did to 3 weeks ago were unremarkable including her albumin nevertheless she is an extremely frail condition physically. We have have been using [name] 04/22/17; I changed her to silver alginate because of the surrounding maceration and moisture last week. The daughter did not like the way the wound looked in the middle of the week and [place] her back [place]. They're putting gauze on top of this. Thinks still using Lotrisone. 05/06/17 on evaluation today patient sacral wound appears to be doing okay and does not seem to be any worse. She is having no significant pain during evaluation today the secondary to mental status she is unable to rate or describe whether she had any pain she was not however flinching. Her daughter states that the wound does appear to be looking better to her. Still we are having difficulty with the skinfold  that seems to be closing in on itself at this point. All in all I feel like she is making some good progress in the [name] seems to be the official for her. They do tell me that a refill if we are gonna continue that today. No fevers, chills, nausea, or vomiting noted at this time. 05/13/17 presents today for evaluation concerning her ongoing sacral pressure ulcer. Unfortunately she also has an area of deep tissue injury in the right Ischial region which is starting to show up. The sacral wound also continues to show signs of necrotic tissue overlying and has declined. Overall we really have not seen a significant improvement in the past several months in regard to the sacral wound and now patient is starting to develop a new wound in the right Ischial region. Obviously this is not trending in the direction that we want to see. No fevers, chills, nausea, or vomiting noted at this time. 05/19/17; I have not seen this wound and almost a month however there is nothing really positive to say about it. Necrotic tissue over the surface which superiorly I think abuts on her sacrum. She has surrounding erythema. I would be surprised if there is not underlying osteomyelitis or soft tissue infection. 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 She apparently eats well per description although I wonder about this looking at her. Lab work I did  probably 4 weeks ago however was really quite normal including a serum albumin 05/26/17; culture I did last week grew Escherichia coli and methicillin sensitive staph aureus which should've been well covered by the Augmentin and ciprofloxacin. Indeed the erythema around the wound in the bed of the wound looks somewhat better. Her intake is still satisfactory. They now have a near fluidized bed 06/02/17; they completed the antibiotics last Friday. Using collagen. Daughter still reports eating and drinking well. There is less visualized erythema around the  wound [name], [name]. ([id number]) Objective Constitutional Sitting or standing Blood Pressure is within target range for patient.. Pulse regular and within target range for patient.Marland Kitchen Respirations regular, non-labored and within target range.. Temperature is normal and within the target range for the patient.Marland Kitchen appears in no distress. Vitals Time Taken: 11:01 AM, Temperature: 98.1 F, Pulse: 51 bpm, Respiratory Rate: 16 breaths/min, Blood Pressure: 138/51 mmHg. Eyes Conjunctivae clear. No discharge. Respiratory Respiratory effort is easy and symmetric bilaterally. Rate is normal at rest and on room air.. Shallow breath sounds but no wheezing work of breathing is normal. Cardiovascular Heart rhythm and rate regular, without murmur or gallop.. Gastrointestinal (GI) Abdomen is soft and non-distended without masses or tenderness. Bowel sounds active in all quadrants.. No liver or spleen enlargement or tenderness.Marland Kitchen Psychiatric Advanced dementia and no overt changes. General Notes: Wound exam; extensive wound over the lower sacral area and coccyx. There is undermining especially at 4:00 but no palpable bone. The surrounding erythema and tenderness is improved. The original wound circumference has 2 necrotic areas here that may eventually require debridement with a scalpel. There is no tenderness around the wound no crepitus Integumentary (Hair, Skin) Wound #1 status is Open. Original cause of wound was Pressure Injury. The wound is located on the Midline Coccyx. The wound measures 5.5cm length x 5cm width x 1.8cm depth; 21.598cm^2 area and 38.877cm^3 volume. There is muscle and Fat Layer (Subcutaneous Tissue) Exposed exposed. There is no tunneling noted, however, there is undermining starting at 12:00 and ending at 12:00 with a maximum distance of 2.8cm. There is a large amount of purulent drainage noted. Foul odor after cleansing was noted. The wound margin is flat and intact. There is no  granulation within the wound bed. There is a large (67- 100%) amount of necrotic tissue within the wound bed including Eschar and Adherent Slough. The periwound skin appearance exhibited: Induration, Erythema. The periwound skin appearance did not [name], [name]. ([id number]) exhibit: Callus, Crepitus, Excoriation, Rash, Scarring, Dry/Scaly, Maceration, Atrophie Blanche, Cyanosis, Ecchymosis, Hemosiderin Staining, Mottled, Pallor, Rubor. The surrounding wound skin color is noted with erythema which is circumferential. Periwound temperature was noted as No Abnormality. The periwound has tenderness on palpation. Assessment Active Problems ICD-10 L89.153 - Pressure ulcer of sacral region, stage 3 G20 - Parkinson's disease Plan Wound Cleansing: Wound #1 Midline Coccyx: Clean wound with Normal Saline. May Shower, gently pat wound dry prior to applying new dressing. Anesthetic: Wound #1 Midline Coccyx: Topical Lidocaine 4% cream applied to wound bed prior to debridement - in clinic Skin Barriers/Peri-Wound Care: Wound #1 Midline Coccyx: Skin Prep Primary Wound Dressing: Wound #1 Midline Coccyx: [name] Ointment Secondary Dressing: Wound #1 Midline Coccyx: Saline moistened gauze - packed lightly into wound Boardered Foam Dressing - silicone bordered dressing due to skin breakdown Dressing Change Frequency: Wound #1 Midline Coccyx: Change dressing every day. Follow-up Appointments: Wound #1 Midline Coccyx: Return Appointment in 2 weeks. Off-Loading: Wound #1 Midline Coccyx: Roho cushion for wheelchair - [facility]  please order roho cushion or gel cushion for patients wheelchair - whichever she qualifies for [name], [name]. ([id number]) Turn and reposition every 2 hours Additional Orders / Instructions: Wound #1 Midline Coccyx: Increase protein intake. - please add protein supplements to patients diet Other: - please add vitamin A, vitamin C and zinc supplements to patients diet Home  Health: Wound #1 Midline Coccyx: Continue Home Health Visits Home Health Nurse may visit PRN to address patient s wound care needs. FACE TO FACE ENCOUNTER: MEDICARE and MEDICAID PATIENTS: I certify that this patient is under my care and that I had a face-to-face encounter that meets the physician face-to-face encounter requirements with this patient on this date. The encounter with the patient was in whole or in part for the following MEDICAL CONDITION: (primary reason for Home Healthcare) MEDICAL NECESSITY: I certify, that based on my findings, NURSING services are a medically necessary home health service. HOME BOUND STATUS: I certify that my clinical findings support that this patient is homebound (i.e., Due to illness or injury, pt requires aid of supportive devices such as crutches, cane, wheelchairs, walkers, the use of special transportation or the assistance of another person to leave their place of residence. There is a normal inability to leave the home and doing so requires considerable and taxing effort. Other absences are for medical reasons / religious services and are infrequent or of short duration when for other reasons). If current dressing causes regression in wound condition, may D/C ordered dressing product/s and apply Normal Saline Moist Dressing daily until next Wound Healing Center / Other MD appointment. Notify Wound Healing Center of regression in wound condition at [phone]. Please direct any NON-WOUND related issues/requests for orders to patient's Primary Care Physician - Dr Vonita Moss change primary dressing to santyl change daily with moist gauze, foam no further antibiotics for now, 10 days of oral ABs seems to have sufficed for now Electronic Signature(s) Signed: 06/03/2017 4:30:21 AM By: Baltazar Najjar MD Entered By: Baltazar Najjar on 06/02/2017 13:43:28 [name]  ([id number]) -------------------------------------------------------------------------------- SuperBill Details Patient Name: [name] Date of Service: 06/02/2017 Medical Record Patient Account Number: [id number] Number: Treating RN: Curtis Sites [id number] (83 y.o. Other Clinician: Date of Birth/Sex: Female) Treating Jamell Opfer Primary Care Provider: Vonita Moss Provider/Extender: G Referring Provider: Vonita Moss Weeks in Treatment: 20 Diagnosis Coding ICD-10 Codes Code Description L89.153 Pressure ulcer of sacral region, stage 3 G20 Parkinson's disease Facility Procedures CPT4 Code: [id number] Description: 99213 - WOUND CARE VISIT-LEV 3 EST PT Modifier: Quantity: 1 Physician Procedures CPT4 Code: [id number] Description: 99213 - WC PHYS LEVEL 3 - EST PT ICD-10 Description Diagnosis L89.153 Pressure ulcer of sacral region, stage 3 Modifier: Quantity: 1 Electronic Signature(s) Signed: 06/03/2017 4:30:21 AM By: Baltazar Najjar MD Entered By: Baltazar Najjar on 06/02/2017 13:44:08

## 2017-06-03 NOTE — Progress Notes (Signed)
Tina MainlandSHAW, Nijah W. ([id number]) Visit Report for 06/02/2017 Arrival Information Details Patient Name: Tina MainlandSHAW, [name] Date of Service: 06/02/2017 11:00 AM Medical Record Patient Account Number: [id number] Number: Treating RN: Huel CoventryWoody, Kim Jun 27, 1923 (83 y.o. Other Clinician: Date of Birth/Sex: Female) Treating ROBSON, MICHAEL Primary Care Teliyah Royal: Vonita MossRISSMAN, MARK Jhonny Calixto/Extender: G Referring Wesam Gearhart: Vonita MossRISSMAN, MARK Weeks in Treatment: 20 Visit Information History Since Last Visit Added or deleted any medications: No Patient Arrived: Wheel Chair Any new allergies or adverse No Arrival Time: 10:59 reactions: Accompanied By: daughter and Had a fall or experienced change in No caregiver activities of daily living that may Transfer Assistance: None affect Patient Identification Verified: Yes risk of falls: Patient Requires Transmission- No Signs or symptoms of abuse/neglect No Based Precautions: since last visito Patient Has Alerts: No Hospitalized since last visit: No Pain Present Now: Unable to [employer] Electronic Signature(s) Signed: 06/02/2017 5:20:16 PM By: Elliot GurneyWoody, BSN, RN, CWS, Kim RN, BSN Entered By: Elliot GurneyWoody, BSN, RN, CWS, Kim on 06/02/2017 11:00:41 Tina MainlandSHAW, [name] ([id number]) -------------------------------------------------------------------------------- Clinic Level of Care Assessment Details Patient Name: Tina MainlandSHAW, Jaquana W. Date of Service: 06/02/2017 11:00 AM Medical Record Patient Account Number: [id number] Number: Treating RN: Huel CoventryWoody, Kim Jun 27, 1923 (83 y.o. Other Clinician: Date of Birth/Sex: Female) Treating ROBSON, MICHAEL Primary Care Brynn Reznik: Vonita MossRISSMAN, MARK Janaria Mccammon/Extender: G Referring Antoninette Lerner: Vonita MossRISSMAN, MARK Weeks in Treatment: 20 Clinic Level of Care Assessment Items TOOL 4 Quantity Score []  - Use when only an EandM is performed on FOLLOW-UP visit 0 ASSESSMENTS - Nursing Assessment / Reassessment []  - Reassessment of Co-morbidities  (includes updates in patient status) 0 X - Reassessment of Adherence to Treatment Plan 1 5 ASSESSMENTS - Wound and Skin Assessment / Reassessment X - Simple Wound Assessment / Reassessment - one wound 1 5 []  - Complex Wound Assessment / Reassessment - multiple wounds 0 []  - Dermatologic / Skin Assessment (not related to wound area) 0 ASSESSMENTS - Focused Assessment []  - Circumferential Edema Measurements - multi extremities 0 []  - Nutritional Assessment / Counseling / Intervention 0 []  - Lower Extremity Assessment (monofilament, tuning fork, pulses) 0 []  - Peripheral Arterial Disease Assessment (using hand held doppler) 0 ASSESSMENTS - Ostomy and/or Continence Assessment and Care []  - Incontinence Assessment and Management 0 []  - Ostomy Care Assessment and Management (repouching, etc.) 0 PROCESS - Coordination of Care X - Simple Patient / Family Education for ongoing care 1 15 []  - Complex (extensive) Patient / Family Education for ongoing care 0 X - Staff obtains ChiropractorConsents, Records, Test Results / Process Orders 1 10 []  - Staff telephones HHA, Nursing Homes / Clarify orders / etc 0 Tina MainlandSHAW, [name] ([id number]) []  - Routine Transfer to another Facility (non-emergent condition) 0 []  - Routine Hospital Admission (non-emergent condition) 0 []  - New Admissions / Manufacturing engine[employer]surance Authorizations / Ordering NPWT, Apligraf, etc. 0 []  - Emergency Hospital Admission (emergent condition) 0 X - Simple Discharge Coordination 1 10 []  - Complex (extensive) Discharge Coordination 0 PROCESS - Special Needs []  - Pediatric / Minor Patient Management 0 []  - Isolation Patient Management 0 []  - Hearing / Language / Visual special needs 0 []  - Assessment of Community assistance (transportation, D/C planning, etc.) 0 []  - Additional assistance / Altered mentation 0 []  - Support Surface(s) Assessment (bed, cushion, seat, etc.) 0 INTERVENTIONS - Wound Cleansing / Measurement X - Simple Wound Cleansing - one wound  1 5 []  - Complex Wound Cleansing - multiple wounds 0 X - Wound Imaging (photographs - any number of wounds) 1  5 []  - Wound Tracing (instead of photographs) 0 X - Simple Wound Measurement - one wound 1 5 []  - Complex Wound Measurement - multiple wounds 0 INTERVENTIONS - Wound Dressings []  - Small Wound Dressing one or multiple wounds 0 []  - Medium Wound Dressing one or multiple wounds 0 X - Large Wound Dressing one or multiple wounds 1 20 []  - Application of Medications - topical 0 []  - Application of Medications - injection 0 [name], GILCHREST. ([id number]) INTERVENTIONS - Miscellaneous []  - External ear exam 0 []  - Specimen Collection (cultures, biopsies, blood, body fluids, etc.) 0 []  - Specimen(s) / Culture(s) sent or taken to Lab for analysis 0 []  - Patient Transfer (multiple staff / Michiel Sites Lift / Similar devices) 0 []  - Simple Staple / Suture removal (25 or less) 0 []  - Complex Staple / Suture removal (26 or more) 0 []  - Hypo / Hyperglycemic Management (close monitor of Blood Glucose) 0 []  - Ankle / Brachial Index (ABI) - do not check if billed separately 0 X - Vital Signs 1 5 Has the patient been seen at the hospital within the last three years: Yes Total Score: 85 Level Of Care: New/Established - Level 3 Electronic Signature(s) Signed: 06/02/2017 5:20:16 PM By: Elliot Gurney, BSN, RN, CWS, Kim RN, BSN Entered By: Elliot Gurney, BSN, RN, CWS, Kim on 06/02/2017 12:33:10 [name] ([id number]) -------------------------------------------------------------------------------- Encounter Discharge Information Details Patient Name: [name] Date of Service: 06/02/2017 11:00 AM Medical Record Patient Account Number: [id number] Number: Treating RN: Curtis Sites [birth year] (83 y.o. Other Clinician: Date of Birth/Sex: Female) Treating ROBSON, MICHAEL Primary Care Johnella Crumm: Vonita Moss Jene Oravec/Extender: G Referring Kemiah Booz: Vonita Moss Weeks in Treatment: 20 Encounter  Discharge Information Items Discharge Pain Level: 0 Discharge Condition: Stable Ambulatory Status: Wheelchair Discharge Destination: Home Transportation: Private Auto daughter and Accompanied By: caregiver Schedule Follow-up Appointment: Yes Medication Reconciliation completed and provided to Patient/Care Yes Erisha Paugh: Patient Clinical Summary of Care: Declined Electronic Signature(s) Signed: 06/02/2017 9:51:20 PM By: Elliot Gurney, BSN, RN, CWS, Kim RN, BSN Entered By: Elliot Gurney, BSN, RN, CWS, Kim on 06/02/2017 21:28:57 [name] ([id number]) -------------------------------------------------------------------------------- Lower Extremity Assessment Details Patient Name: [name] Date of Service: 06/02/2017 11:00 AM Medical Record Patient Account Number: [id number] Number: Treating RN: Huel Coventry 1923-03-16 (83 y.o. Other Clinician: Date of Birth/Sex: Female) Treating ROBSON, MICHAEL Primary Care Owens Hara: Vonita Moss Corvin Sorbo/Extender: G Referring Jadesola Poynter: Vonita Moss Weeks in Treatment: 20 Electronic Signature(s) Signed: 06/02/2017 5:20:16 PM By: Elliot Gurney, BSN, RN, CWS, Kim RN, BSN Entered By: Elliot Gurney, BSN, RN, CWS, Kim on 06/02/2017 11:09:09 [name] ([id number]) -------------------------------------------------------------------------------- Multi Wound Chart Details Patient Name: [name] Date of Service: 06/02/2017 11:00 AM Medical Record Patient Account Number: [id number] Number: Treating RN: Curtis Sites Sep 30, 1922 (83 y.o. Other Clinician: Date of Birth/Sex: Female) Treating ROBSON, MICHAEL Primary Care Demarian Epps: Vonita Moss Nayomi Tabron/Extender: G Referring Odessia Asleson: Vonita Moss Weeks in Treatment: 20 Vital Signs Height(in): Pulse(bpm): 51 Weight(lbs): Blood Pressure 138/51 (mmHg): Body Mass Index(BMI): Temperature(F): 98.1 Respiratory Rate 16 (breaths/min): Photos: [N/A:N/A] Wound Location: Coccyx -  Midline N/A N/A Wounding Event: Pressure Injury N/A N/A Primary Etiology: Pressure Ulcer N/A N/A Comorbid History: Anemia, Hypertension, N/A N/A Dementia Date Acquired: 12/01/2016 N/A N/A Weeks of Treatment: 20 N/A N/A Wound Status: Open N/A N/A Measurements L x W x D 5.5x5x1.8 N/A N/A (cm) Area (cm) : 21.598 N/A N/A Volume (cm) : 38.877 N/A N/A % Reduction in Area: -419.80% N/A N/A % Reduction in Volume: -2239.20%  N/A N/A Starting Position 1 12 (o'clock): Ending Position 1 12 (o'clock): Maximum Distance 1 2.8 (cm): Undermining: Yes N/A N/A Classification: Category/Stage IV N/A N/A [name], [name] ([id number]) Exudate Amount: Large N/A N/A Exudate Type: Purulent N/A N/A Exudate Color: yellow, brown, green N/A N/A Foul Odor After Yes N/A N/A Cleansing: Odor Anticipated Due to No N/A N/A Product Use: Wound Margin: Flat and Intact N/A N/A Granulation Amount: None Present (0%) N/A N/A Necrotic Amount: Large (67-100%) N/A N/A Necrotic Tissue: Eschar, Adherent Slough N/A N/A Exposed Structures: Fat Layer (Subcutaneous N/A N/A Tissue) Exposed: Yes Muscle: Yes Fascia: No Tendon: No Joint: No Bone: No Epithelialization: None N/A N/A Periwound Skin Texture: Induration: Yes N/A N/A Excoriation: No Callus: No Crepitus: No Rash: No Scarring: No Periwound Skin Maceration: No N/A N/A Moisture: Dry/Scaly: No Periwound Skin Color: Erythema: Yes N/A N/A Atrophie Blanche: No Cyanosis: No Ecchymosis: No Hemosiderin Staining: No Mottled: No Pallor: No Rubor: No Erythema Location: Circumferential N/A N/A Temperature: No Abnormality N/A N/A Tenderness on Yes N/A N/A Palpation: Wound Preparation: Ulcer Cleansing: N/A N/A Rinsed/Irrigated with Saline Topical Anesthetic Applied: Other: lidocaine 4% Treatment Notes Wound #1 (Midline Coccyx) ADONIA, PORADA. ([id number]) 1. Cleansed with: Clean wound with Normal Saline 4. Dressing Applied: [name] Ointment 5.  Secondary Dressing Applied Bordered Foam Dressing Saline moistened guaze Electronic Signature(s) Signed: 06/02/2017 9:51:20 PM By: Elliot Gurney, BSN, RN, CWS, Kim RN, BSN Entered By: Elliot Gurney, BSN, RN, CWS, Kim on 06/02/2017 21:27:27 [name] ([id number]) -------------------------------------------------------------------------------- Multi-Disciplinary Care Plan Details Patient Name: [name] Date of Service: 06/02/2017 11:00 AM Medical Record Patient Account Number: [id number] Number: Treating RN: Huel Coventry 24-Apr-1923 (83 y.o. Other Clinician: Date of Birth/Sex: Female) Treating ROBSON, MICHAEL Primary Care Topaz Raglin: Vonita Moss Yvonna Brun/Extender: G Referring Linh Hedberg: Vonita Moss Weeks in Treatment: 20 Active Inactive ` Abuse / Safety / Falls / Self Care Management Nursing Diagnoses: Impaired physical mobility Potential for falls Goals: Patient will remain injury free Date Initiated: 01/07/2017 Target Resolution Date: 04/03/2017 Goal Status: Active Interventions: Assess fall risk on admission and as needed Notes: ` Nutrition Nursing Diagnoses: Potential for alteratiion in Nutrition/Potential for imbalanced nutrition Goals: Patient/caregiver agrees to and verbalizes understanding of need to use nutritional supplements and/or vitamins as prescribed Date Initiated: 01/07/2017 Target Resolution Date: 04/03/2017 Goal Status: Active Interventions: Assess patient nutrition upon admission and as needed per policy Notes: ` Orientation to the Wound Care Program [name], [name] ([id number]) Nursing Diagnoses: Knowledge deficit related to the wound healing center program Goals: Patient/caregiver will verbalize understanding of the Wound Healing Center Program Date Initiated: 01/07/2017 Target Resolution Date: 04/03/2017 Goal Status: Active Interventions: Provide education on orientation to the wound center Notes: ` Pressure Nursing  Diagnoses: Knowledge deficit related to causes and risk factors for pressure ulcer development Goals: Patient will remain free from development of additional pressure ulcers Date Initiated: 01/07/2017 Target Resolution Date: 04/03/2017 Goal Status: Active Interventions: Assess potential for pressure ulcer upon admission and as needed Notes: ` Wound/Skin Impairment Nursing Diagnoses: Impaired tissue integrity Goals: Patient/caregiver will verbalize understanding of skin care regimen Date Initiated: 01/07/2017 Target Resolution Date: 04/03/2017 Goal Status: Active Ulcer/skin breakdown will have a volume reduction of 30% by week 4 Date Initiated: 01/07/2017 Target Resolution Date: 04/03/2017 Goal Status: Active Ulcer/skin breakdown will have a volume reduction of 50% by week 8 Date Initiated: 01/07/2017 Target Resolution Date: 04/03/2017 Goal Status: Active Ulcer/skin breakdown will have a volume reduction of 80% by week 12 [name], [name] ([id number]) Date Initiated:  01/07/2017 Target Resolution Date: 04/03/2017 Goal Status: Active Ulcer/skin breakdown will heal within 14 weeks Date Initiated: 01/07/2017 Target Resolution Date: 04/03/2017 Goal Status: Active Interventions: Assess patient/caregiver ability to obtain necessary supplies Assess patient/caregiver ability to perform ulcer/skin care regimen upon admission and as needed Assess ulceration(s) every visit Notes: Electronic Signature(s) Signed: 06/02/2017 9:51:20 PM By: Elliot Gurney, BSN, RN, CWS, Kim RN, BSN Previous Signature: 06/02/2017 5:20:16 PM Version By: Elliot Gurney, BSN, RN, CWS, Kim RN, BSN Entered By: Elliot Gurney, BSN, RN, CWS, Kim on 06/02/2017 21:27:17 [name] ([id number]) -------------------------------------------------------------------------------- Pain Assessment Details Patient Name: [name] Date of Service: 06/02/2017 11:00 AM Medical Record Patient Account Number: [id number] Number: Treating RN: Huel Coventry April 22, 1923 (83 y.o. Other Clinician: Date of Birth/Sex: Female) Treating ROBSON, MICHAEL Primary Care Wafa Martes: Vonita Moss Kendra Woolford/Extender: G Referring Ivon Roedel: Vonita Moss Weeks in Treatment: 20 Active Problems Location of Pain Severity and Description of Pain Patient Has Paino Patient Unable to Respond Site Locations Pain Management and Medication Current Pain Management: Goals for Pain Management Topical or injectable lidocaine is offered to patient for acute pain when surgical debridement is performed. If needed, Patient is instructed to use over the counter pain medication for the following 24-48 hours after debridement. Wound care MDs do not prescribed pain medications. Patient has chronic pain or uncontrolled pain. Patient has been instructed to make an appointment with their Primary Care Physician for pain management. Electronic Signature(s) Signed: 06/02/2017 5:20:16 PM By: Elliot Gurney, BSN, RN, CWS, Kim RN, BSN Entered By: Elliot Gurney, BSN, RN, CWS, Kim on 06/02/2017 11:00:57 [name] ([id number]) -------------------------------------------------------------------------------- Patient/Caregiver Education Details Patient Name: [name] Date of Service: 06/02/2017 11:00 AM Medical Record Patient Account Number: [id number] Number: Treating RN: Huel Coventry 1923-08-09 (83 y.o. Other Clinician: Date of Birth/Gender: Female) Treating ROBSON, MICHAEL Primary Care Physician: Vonita Moss Physician/Extender: G Referring Physician: Veverly Fells in Treatment: 20 Education Assessment Education Provided To: Patient Education Topics Provided Pressure: Handouts: Pressure Ulcers: Care and Offloading Methods: Demonstration, Explain/Verbal Responses: State content correctly Electronic Signature(s) Signed: 06/02/2017 9:51:20 PM By: Elliot Gurney, BSN, RN, CWS, Kim RN, BSN Entered By: Elliot Gurney, BSN, RN, CWS, Kim on 06/02/2017 21:29:16 [name]  ([id number]) -------------------------------------------------------------------------------- Wound Assessment Details Patient Name: [name] Date of Service: 06/02/2017 11:00 AM Medical Record Patient Account Number: [id number] Number: Treating RN: Huel Coventry January 07, 1923 (83 y.o. Other Clinician: Date of Birth/Sex: Female) Treating ROBSON, MICHAEL Primary Care Lorelle Macaluso: Vonita Moss Glenette Bookwalter/Extender: G Referring Ellington Cornia: Vonita Moss Weeks in Treatment: 20 Wound Status Wound Number: 1 Primary Etiology: Pressure Ulcer Wound Location: Coccyx - Midline Wound Status: Open Wounding Event: Pressure Injury Comorbid History: Anemia, Hypertension, Dementia Date Acquired: 12/01/2016 Weeks Of Treatment: 20 Clustered Wound: No Photos Photo Uploaded By: Elliot Gurney, BSN, RN, CWS, Kim on 06/02/2017 17:15:23 Wound Measurements Length: (cm) 5.5 % Reduction in Ar Width: (cm) 5 % Reduction in Vo Depth: (cm) 1.8 Epithelialization Area: (cm) 21.598 Tunneling: Volume: (cm) 38.877 Undermining: Starting Posit Ending Positio Maximum Distan ea: -419.8% lume: -2239.2% : None No Yes ion (o'clock): 12 n (o'clock): 12 ce: (cm) 2.8 Wound Description Classification: Category/Stage IV Foul Odor After C Wound Margin: Flat and Intact Due to Product [place] Exudate Amount: Large Slough/Fibrino Exudate Type: Purulent Exudate Color: yellow, brown, green leansing: Yes e: No Yes Wound Bed Granulation Amount: None Present (0%) Exposed Structure SHALLON, YAKLIN. ([id number]) Necrotic Amount: Large (67-100%) Fascia Exposed: No Necrotic Quality: Eschar, Adherent Slough Fat Layer (Subcutaneous Tissue) Exposed: Yes Tendon Exposed: No  Muscle Exposed: Yes Necrosis of Muscle: No Joint Exposed: No Bone Exposed: No Periwound Skin Texture Texture Color No Abnormalities Noted: No No Abnormalities Noted: No Callus: No Atrophie Blanche: No Crepitus: No Cyanosis: No Excoriation:  No Ecchymosis: No Induration: Yes Erythema: Yes Rash: No Erythema Location: Circumferential Scarring: No Hemosiderin Staining: No Mottled: No Moisture Pallor: No No Abnormalities Noted: No Rubor: No Dry / Scaly: No Maceration: No Temperature / Pain Temperature: No Abnormality Tenderness on Palpation: Yes Wound Preparation Ulcer Cleansing: Rinsed/Irrigated with Saline Topical Anesthetic Applied: Other: lidocaine 4%, Treatment Notes Wound #1 (Midline Coccyx) 1. Cleansed with: Clean wound with Normal Saline 4. Dressing Applied: [name] Ointment 5. Secondary Dressing Applied Bordered Foam Dressing Saline moistened guaze Electronic Signature(s) Signed: 06/02/2017 5:20:16 PM By: Elliot Gurney, BSN, RN, CWS, Kim RN, BSN Entered By: Elliot Gurney, BSN, RN, CWS, Kim on 06/02/2017 11:08:36 [name]) -------------------------------------------------------------------------------- Vitals Details Patient Name: [name] Date of Service: 06/02/2017 11:00 AM Medical Record Patient Account Number: [id number] Number: Treating RN: Huel Coventry [birth year] (83 y.o. Other Clinician: Date of Birth/Sex: Female) Treating ROBSON, MICHAEL Primary Care Kollyns Mickelson: Vonita Moss Jeptha Hinnenkamp/Extender: G Referring Melora Menon: Vonita Moss Weeks in Treatment: 20 Vital Signs Time Taken: 11:01 Temperature (F): 98.1 Pulse (bpm): 51 Respiratory Rate (breaths/min): 16 Blood Pressure (mmHg): 138/51 Reference Range: 80 - 120 mg / dl Electronic Signature(s) Signed: 06/02/2017 5:20:16 PM By: Elliot Gurney, BSN, RN, CWS, Kim RN, BSN Entered By: Elliot Gurney, BSN, RN, CWS, Kim on 06/02/2017 11:03:04

## 2017-06-09 ENCOUNTER — Ambulatory Visit: Payer: Medicare Other | Admitting: Internal Medicine

## 2017-06-16 ENCOUNTER — Encounter: Payer: Medicare Other | Admitting: Internal Medicine

## 2017-06-16 DIAGNOSIS — L89153 Pressure ulcer of sacral region, stage 3: Secondary | ICD-10-CM | POA: Diagnosis not present

## 2017-06-17 NOTE — Progress Notes (Signed)
KINDA, POTTLE ([id number]) Visit Report for 06/16/2017 HPI Details Patient Name: [name], [name] Date of Service: 06/16/2017 10:15 AM Medical Record Patient Account Number: [id number] Number: Treating RN: Curtis Sites 1922-12-21 (83 y.o. Other Clinician: Date of Birth/Sex: Female) Treating ROBSON, MICHAEL Primary Care Provider: Vonita Moss Provider/Extender: G Referring Provider: Vonita Moss Weeks in Treatment: 22 History of Present Illness HPI Description: 01/07/17 this is a 83 year old woman admitted to the clinic today for review of a pressure ulcer on her lower sacrum. She is referred from her primary physician's office after being seen on 3/22 with a 3 cm pressure area. Her daughter and caretaker accompanied her today state that the area first became obvious about a month ago and his since deteriorated. They have recently got [name] a home health involved and have been using [name] to the wound. They have ordered a pressure relief surface for her mattress. They are turning her religiously. They state that she eats well and they've been forcing fluids on her. She is on a multivitamin. Looking through [facility] point last albumin I see was 4.4 on 10/28. The patient has advanced parkinsonism which looks superficially like advanced Parkinson's disease although her daughter tells me she did not ever respond to Sinemet therefore this may have another pathology with signs of parkinsonism. However I think this is largely a mute point currently. She also has dementia and is nonambulatory. Since this started they have been keeping her in bed and turning her religiously every 2 hours. She lives at home in [place] with her husband with 24/7 care giving 01/13/17 santyl change qd. Still will require further debridement. continue santyl. 01/20/17; patient's wound actually looks some better less adherent necrotic surface. There is actually visible granulation. We're using  [name] 01/27/17; better-looking surface but still a lot of necrotic tissue on the base of this wound. The periwound erythema is better than last week we are still using [name]. Her daughter tells [place] that she is still having trouble with the pressure-relief mattress through medical modalities 02/03/18; I had the patient scheduled for a two week followup however her daughter brought her in early concerned for discoloration on 2 areas of the wound circumference. We have bee using santyl 02/10/17;Better looking surface to the wound. Rim appears better suggesting better offloading. Using santyl 02/24/17; change to Silver Collegen last time. Wound appears better. 03/10/17; still using silver collagen religious offloading. Intake is satisfactory per her daughter. Dimension slightly better 03/12/2017 -- Dr. Jannetta Quint patient who had been seen 2 days ago and was doing fairly well. The patient is brought in by her daughter who noticed a new wound just above the previous wound on her sacral area and going on more to the left lateral side. She was very concerned and we asked her to get in for an opinion. 03/17/17; above is noted. The patient has developed a progressive area to the left of her original wound. This seems to this started with a ring of red skin with a more pale interior almost looking fungal. There was a rim of blister through part of the area although this did not look like zoster. They have been applying triamcinolone that was prescribed last week by Dr. Meyer Russel and the area has a fold to a linear band area which is confluent, erythematous and with obvious epidermal swelling but there is no overt tenderness or crepitus. Tina, [name] ([id number]) She has lost some surface epithelium closer to the wound surface itself and now has a  more superficial wound in this area and the extending erythema goes towards the left buttock. This is well demarcated between involved in normal skin but once again does  not appear to be at all tender. If there is a contact issue here I cannot get the history out of the daughter or the caregiver that are with her. 03/24/17; the patient arrives today with the wound slightly worse slightly more drainage. The bandlike area of erythema that I treated as a possible pineal infection has improved somewhat although proximally is still has confluent erythema without overt tenderness. We have been using silver alginate since the most recent deterioration. To the bandlike degree of erythema we have been using Lotrisone cream 03/31/17; patient arrives today with the wound slightly larger, necrotic surface and surrounding erythema. The bandlike area of erythema that I treated as a possible pineal infection is less swollen but still present I've been using Lotrisone cream on that largely related to the presence of a tinea looking infection when this was first seen. We've been using silver alginate. X-ray that I ordered last week showed no acute bony abnormalities mild fecal impaction. Lab work showed a comprehensive metabolic panel that was normal including an albumin of 3.8. White count was 9.5 hemoglobin 12.3 differential count normal. C-reactive protein was less than 1 and sedimentation rate and 17. The latter 2 values does not support an ongoing bacterial infection. 04/14/17; patient arrives after a 2 week hiatus. Her wound is not in good condition. Although the base of the wound looks stable she still has an erythematous area that was apparently blistered over the weekend. This again points to the left. As our intake nurse pointed out today this is in the area where the tissue folds together and we may need to prevent try to prevent this. Lab work and x-ray that I did to 3 weeks ago were unremarkable including her albumin nevertheless she is an extremely frail condition physically. We have have been using [name] 04/22/17; I changed her to silver alginate because of the  surrounding maceration and moisture last week. The daughter did not like the way the wound looked in the middle of the week and changed her back to [place]. They're putting gauze on top of this. Thinks still using Lotrisone. 05/06/17 on evaluation today patient sacral wound appears to be doing okay and does not seem to be any worse. She is having no significant pain during evaluation today the secondary to mental status she is unable to rate or describe whether she had any pain she was not however flinching. Her daughter states that the wound does appear to be looking better to her. Still we are having difficulty with the skinfold that seems to be closing in on itself at this point. All in all I feel like she is making some good progress in the [name] seems to be the official for her. They do tell me that a refill if we are gonna continue that today. No fevers, chills, nausea, or vomiting noted at this time. 05/13/17 presents today for evaluation concerning her ongoing sacral pressure ulcer. Unfortunately she also has an area of deep tissue injury in the right Ischial region which is starting to show up. The sacral wound also continues to show signs of necrotic tissue overlying and has declined. Overall we really have not seen a significant improvement in the past several months in regard to the sacral wound and now patient is starting to develop a new wound in the right  Ischial region. Obviously this is not trending in the direction that we want to see. No fevers, chills, nausea, or vomiting noted at this time. 05/19/17; I have not seen this wound and almost a month however there is nothing really positive to say about it. Necrotic tissue over the surface which superiorly I think abuts on her sacrum. She has surrounding erythema. I would be surprised if there is not underlying osteomyelitis or soft tissue infection. 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 She apparently eats well per  description although I wonder about this looking at her. Lab work I did probably 4 weeks ago however was really quite normal including a serum albumin 05/26/17; culture I did last week grew Escherichia coli and methicillin sensitive staph aureus which should've been well covered by the Augmentin and ciprofloxacin. Indeed the erythema around the wound in the bed of the wound looks somewhat better. Her intake is still satisfactory. They now have a near fluidized bed 06/02/17; they completed the antibiotics last Friday. Using collagen. Daughter still reports eating and drinking MONCERRATH, BERHE. ([id number]) well. There is less visualized erythema around the wound. 06/16/17; large pressure ulcer over her lower sacrum and coccyx. Using [name] to the wound bed. Electronic Signature(s) Signed: 06/16/2017 4:13:20 PM By: Baltazar Najjar MD Entered By: Baltazar Najjar on 06/16/2017 12:03:00 [name] ([id number]) -------------------------------------------------------------------------------- Physical Exam Details Patient Name: [name] Date of Service: 06/16/2017 10:15 AM Medical Record Patient Account Number: [id number] Number: Treating RN: Curtis Sites 03-26-1923 (83 y.o. Other Clinician: Date of Birth/Sex: Female) Treating ROBSON, MICHAEL Primary Care Provider: Vonita Moss Provider/Extender: G Referring Provider: Vonita Moss Weeks in Treatment: 22 Constitutional Sitting or standing Blood Pressure is within target range for patient.. Pulse regular and within target range for patient.Marland Kitchen Respirations regular, non-labored and within target range.. Temperature is normal and within the target range for the patient.Marland Kitchen appears in no distress, although her overall general appearance is that of increasing frailty. Eyes Conjunctivae clear. No discharge. Respiratory Respiratory effort is easy and symmetric bilaterally. Rate is normal at rest and on room  air.. Gastrointestinal (GI) Abdomen is soft and non-distended without masses or tenderness. Bowel sounds active in all quadrants.. No liver or spleen enlargement or tenderness.. Integumentary (Hair, Skin) There is no erythema around the wound. Psychiatric Advanced dementia. Notes Wound exam; extensive wound over the lower sacrum and coccyx. There is considerable undermining but still no palpable bone. Minimal erythema around the wound. No soft tissue tenderness. The wound bed itself has some adherent material although I think this is getting better gradually with the [name] and I elected to not do any debridement Electronic Signature(s) Signed: 06/16/2017 4:13:20 PM By: Baltazar Najjar MD Entered By: Baltazar Najjar on 06/16/2017 12:07:22 [name] ([id number]) -------------------------------------------------------------------------------- Physician Orders Details Patient Name: [name] Date of Service: 06/16/2017 10:15 AM Medical Record Patient Account Number: [id number] Number: Treating RN: Curtis Sites [birth year] (83 y.o. Other Clinician: Date of Birth/Sex: Female) Treating ROBSON, MICHAEL Primary Care Provider: Vonita Moss Provider/Extender: G Referring Provider: Vonita Moss Weeks in Treatment: 22 Verbal / Phone Orders: No Diagnosis Coding Wound Cleansing Wound #1 Midline Coccyx o Clean wound with Normal Saline. o May Shower, gently pat wound dry prior to applying new dressing. Anesthetic Wound #1 Midline Coccyx o Topical Lidocaine 4% cream applied to wound bed prior to debridement - in clinic Skin Barriers/Peri-Wound Care Wound #1 Midline Coccyx o Skin Prep Primary Wound Dressing Wound #1 Midline Coccyx o  [name] Ointment Secondary Dressing Wound #1 Midline Coccyx o Saline moistened gauze - packed lightly into wound o Boardered Foam Dressing - silicone bordered dressing due to skin breakdown Dressing Change  Frequency Wound #1 Midline Coccyx o Change dressing every day. Follow-up Appointments Wound #1 Midline Coccyx o Return Appointment in 2 weeks. Off-Loading Wound #1 Midline Coccyx LINDSEE, LABARRE. ([id number]) o Roho cushion for wheelchair - [facility] please order roho cushion or gel cushion for patients wheelchair - whichever she qualifies for o Turn and reposition every 2 hours Additional Orders / Instructions Wound #1 Midline Coccyx o Increase protein intake. - please add protein supplements to patients diet o Other: - please add vitamin A, vitamin C and zinc supplements to patients diet Home Health Wound #1 Midline Coccyx o Continue Home Health Visits - [facility] o Home Health Nurse may visit PRN to address patientos wound care needs. o FACE TO FACE ENCOUNTER: MEDICARE and MEDICAID PATIENTS: I certify that this patient is under my care and that I had a face-to-face encounter that meets the physician face-to-face encounter requirements with this patient on this date. The encounter with the patient was in whole or in part for the following MEDICAL CONDITION: (primary reason for Home Healthcare) MEDICAL NECESSITY: I certify, that based on my findings, NURSING services are a medically necessary home health service. HOME BOUND STATUS: I certify that my clinical findings support that this patient is homebound (i.e., Due to illness or injury, pt requires aid of supportive devices such as crutches, cane, wheelchairs, walkers, the use of special transportation or the assistance of another person to leave their place of residence. There is a normal inability to leave the home and doing so requires considerable and taxing effort. Other absences are for medical reasons / religious services and are infrequent or of short duration when for other reasons). o If current dressing causes regression in wound condition, may D/C ordered dressing product/s and apply Normal Saline Moist  Dressing daily until next Wound Healing Center / Other MD appointment. Notify Wound Healing Center of regression in wound condition at [phone]. o Please direct any NON-WOUND related issues/requests for orders to patient's Primary Care Physician - Dr Vonita Moss Electronic Signature(s) Signed: 06/16/2017 4:13:20 PM By: Baltazar Najjar MD Signed: 06/16/2017 4:51:53 PM By: Curtis Sites Entered By: Curtis Sites on 06/16/2017 10:57:12 [name] ([id number]) -------------------------------------------------------------------------------- Problem List Details Patient Name: [name] Date of Service: 06/16/2017 10:15 AM Medical Record Patient Account Number: [id number] Number: Treating RN: Curtis Sites 12/31/1922 (83 y.o. Other Clinician: Date of Birth/Sex: Female) Treating ROBSON, MICHAEL Primary Care Provider: Vonita Moss Provider/Extender: G Referring Provider: Vonita Moss Weeks in Treatment: 22 Active Problems ICD-10 Encounter Code Description Active Date Diagnosis L89.153 Pressure ulcer of sacral region, stage 3 01/07/2017 Yes G20 Parkinson's disease 01/07/2017 Yes Inactive Problems Resolved Problems Electronic Signature(s) Signed: 06/16/2017 4:13:20 PM By: Baltazar Najjar MD Entered By: Baltazar Najjar on 06/16/2017 11:59:08 [name] ([id number]) -------------------------------------------------------------------------------- Progress Note Details Patient Name: [name] Date of Service: 06/16/2017 10:15 AM Medical Record Patient Account Number: [id number] Number: Treating RN: Curtis Sites [birth year] (83 y.o. Other Clinician: Date of Birth/Sex: Female) Treating ROBSON, MICHAEL Primary Care Provider: Vonita Moss Provider/Extender: G Referring Provider: Vonita Moss Weeks in Treatment: 22 Subjective History of Present Illness (HPI) 01/07/17 this is a 83 year old woman admitted to the clinic today for  review of a pressure ulcer on her lower sacrum. She is referred from her primary physician's office after being  seen on 3/22 with a 3 cm pressure area. Her daughter and caretaker accompanied her today state that the area first became obvious about a month ago and his since deteriorated. They have recently got [name] a home health involved and have been using [name] to the wound. They have ordered a pressure relief surface for her mattress. They are turning her religiously. They state that she eats well and they've been forcing fluids on her. She is on a multivitamin. Looking through [facility] point last albumin I see was 4.4 on 10/28. The patient has advanced parkinsonism which looks superficially like advanced Parkinson's disease although her daughter tells me she did not ever respond to Sinemet therefore this may have another pathology with signs of parkinsonism. However I think this is largely a mute point currently. She also has dementia and is nonambulatory. Since this started they have been keeping her in bed and turning her religiously every 2 hours. She lives at home in [place] with her husband with 24/7 care giving 01/13/17 santyl change qd. Still will require further debridement. continue santyl. 01/20/17; patient's wound actually looks some better less adherent necrotic surface. There is actually visible granulation. We're using [name] 01/27/17; better-looking surface but still a lot of necrotic tissue on the base of this wound. The periwound erythema is better than last week we are still using [name]. Her daughter tells [place] that she is still having trouble with the pressure-relief mattress through medical modalities 02/03/18; I had the patient scheduled for a two week followup however her daughter brought her in early concerned for discoloration on 2 areas of the wound circumference. We have bee using santyl 02/10/17;Better looking surface to the wound. Rim appears better suggesting  better offloading. Using santyl 02/24/17; change to Silver Collegen last time. Wound appears better. 03/10/17; still using silver collagen religious offloading. Intake is satisfactory per her daughter. Dimension slightly better 03/12/2017 -- Dr. Jannetta Quint patient who had been seen 2 days ago and was doing fairly well. The patient is brought in by her daughter who noticed a new wound just above the previous wound on her sacral area and going on more to the left lateral side. She was very concerned and we asked her to get in for an opinion. 03/17/17; above is noted. The patient has developed a progressive area to the left of her original wound. This seems to this started with a ring of red skin with a more pale interior almost looking fungal. There was a rim of blister through part of the area although this did not look like zoster. They have been applying triamcinolone that was prescribed last week by Dr. Meyer Russel and the area has a fold to a linear band area which is confluent, erythematous and with obvious epidermal swelling but there is no overt tenderness or crepitus. She has lost some surface epithelium closer to the wound surface itself and now has a more superficial wound in this area and the extending erythema goes towards the left buttock. This is well demarcated GILDA, ABBOUD. ([id number]) between involved in normal skin but once again does not appear to be at all tender. If there is a contact issue here I cannot get the history out of the daughter or the caregiver that are with her. 03/24/17; the patient arrives today with the wound slightly worse slightly more drainage. The bandlike area of erythema that I treated as a possible pineal infection has improved somewhat although proximally is still has confluent erythema without overt tenderness.  We have been using silver alginate since the most recent deterioration. To the bandlike degree of erythema we have been using Lotrisone cream 03/31/17;  patient arrives today with the wound slightly larger, necrotic surface and surrounding erythema. The bandlike area of erythema that I treated as a possible pineal infection is less swollen but still present I've been using Lotrisone cream on that largely related to the presence of a tinea looking infection when this was first seen. We've been using silver alginate. X-ray that I ordered last week showed no acute bony abnormalities mild fecal impaction. Lab work showed a comprehensive metabolic panel that was normal including an albumin of 3.8. White count was 9.5 hemoglobin 12.3 differential count normal. C-reactive protein was less than 1 and sedimentation rate and 17. The latter 2 values does not support an ongoing bacterial infection. 04/14/17; patient arrives after a 2 week hiatus. Her wound is not in good condition. Although the base of the wound looks stable she still has an erythematous area that was apparently blistered over the weekend. This again points to the left. As our intake nurse pointed out today this is in the area where the tissue folds together and we may need to prevent try to prevent this. Lab work and x-ray that I did to 3 weeks ago were unremarkable including her albumin nevertheless she is an extremely frail condition physically. We have have been using [name] 04/22/17; I changed her to silver alginate because of the surrounding maceration and moisture last week. The daughter did not like the way the wound looked in the middle of the week and changed her back to [place]. They're putting gauze on top of this. Thinks still using Lotrisone. 05/06/17 on evaluation today patient sacral wound appears to be doing okay and does not seem to be any worse. She is having no significant pain during evaluation today the secondary to mental status she is unable to rate or describe whether she had any pain she was not however flinching. Her daughter states that the wound does appear to be  looking better to her. Still we are having difficulty with the skinfold that seems to be closing in on itself at this point. All in all I feel like she is making some good progress in the [name] seems to be the official for her. They do tell me that a refill if we are gonna continue that today. No fevers, chills, nausea, or vomiting noted at this time. 05/13/17 presents today for evaluation concerning her ongoing sacral pressure ulcer. Unfortunately she also has an area of deep tissue injury in the right Ischial region which is starting to show up. The sacral wound also continues to show signs of necrotic tissue overlying and has declined. Overall we really have not seen a significant improvement in the past several months in regard to the sacral wound and now patient is starting to develop a new wound in the right Ischial region. Obviously this is not trending in the direction that we want to see. No fevers, chills, nausea, or vomiting noted at this time. 05/19/17; I have not seen this wound and almost a month however there is nothing really positive to say about it. Necrotic tissue over the surface which superiorly I think abuts on her sacrum. She has surrounding erythema. I would be surprised if there is not underlying osteomyelitis or soft tissue infection. 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 She apparently eats well per description although I wonder about this  looking at her. Lab work I did probably 4 weeks ago however was really quite normal including a serum albumin 05/26/17; culture I did last week grew Escherichia coli and methicillin sensitive staph aureus which should've been well covered by the Augmentin and ciprofloxacin. Indeed the erythema around the wound in the bed of the wound looks somewhat better. Her intake is still satisfactory. They now have a near fluidized bed 06/02/17; they completed the antibiotics last Friday. Using collagen. Daughter still reports eating and  drinking well. There is less visualized erythema around the wound. 06/16/17; large pressure ulcer over her lower sacrum and coccyx. Using [name] to the wound bed. DAJANAE, BROPHY ([id number]) Objective Constitutional Sitting or standing Blood Pressure is within target range for patient.. Pulse regular and within target range for patient.Marland Kitchen Respirations regular, non-labored and within target range.. Temperature is normal and within the target range for the patient.Marland Kitchen appears in no distress, although her overall general appearance is that of increasing frailty. Vitals Time Taken: 10:29 AM, Temperature: 97.2 F, Pulse: 76 bpm, Respiratory Rate: 16 breaths/min, Blood Pressure: 122/56 mmHg. Eyes Conjunctivae clear. No discharge. Respiratory Respiratory effort is easy and symmetric bilaterally. Rate is normal at rest and on room air.. Gastrointestinal (GI) Abdomen is soft and non-distended without masses or tenderness. Bowel sounds active in all quadrants.. No liver or spleen enlargement or tenderness.Marland Kitchen Psychiatric Advanced dementia. General Notes: Wound exam; extensive wound over the lower sacrum and coccyx. There is considerable undermining but still no palpable bone. Minimal erythema around the wound. No soft tissue tenderness. The wound bed itself has some adherent material although I think this is getting better gradually with the [name] and I elected to not do any debridement Integumentary (Hair, Skin) There is no erythema around the wound. Wound #1 status is Open. Original cause of wound was Pressure Injury. The wound is located on the Midline Coccyx. The wound measures 5.7cm length x 5cm width x 1.8cm depth; 22.384cm^2 area and 40.291cm^3 volume. There is muscle and Fat Layer (Subcutaneous Tissue) Exposed exposed. There is no tunneling noted, however, there is undermining starting at 1:00 and ending at 4:00 with a maximum distance of 2.7cm. There is a large amount of purulent drainage  noted. Foul odor after cleansing was noted. The wound margin is flat and intact. There is small (1-33%) red granulation within the wound bed. There is a large (67-100%) amount of necrotic tissue within the wound bed including Eschar and Adherent Slough. The periwound skin appearance exhibited: Induration, Erythema. The periwound skin appearance did not FRANCESA, EUGENIO. ([id number]) exhibit: Callus, Crepitus, Excoriation, Rash, Scarring, Dry/Scaly, Maceration, Atrophie Blanche, Cyanosis, Ecchymosis, Hemosiderin Staining, Mottled, Pallor, Rubor. The surrounding wound skin color is noted with erythema which is circumferential. Periwound temperature was noted as No Abnormality. The periwound has tenderness on palpation. Assessment Active Problems ICD-10 L89.153 - Pressure ulcer of sacral region, stage 3 G20 - Parkinson's disease Plan Wound Cleansing: Wound #1 Midline Coccyx: Clean wound with Normal Saline. May Shower, gently pat wound dry prior to applying new dressing. Anesthetic: Wound #1 Midline Coccyx: Topical Lidocaine 4% cream applied to wound bed prior to debridement - in clinic Skin Barriers/Peri-Wound Care: Wound #1 Midline Coccyx: Skin Prep Primary Wound Dressing: Wound #1 Midline Coccyx: [name] Ointment Secondary Dressing: Wound #1 Midline Coccyx: Saline moistened gauze - packed lightly into wound Boardered Foam Dressing - silicone bordered dressing due to skin breakdown Dressing Change Frequency: Wound #1 Midline Coccyx: Change dressing every day. Follow-up Appointments: Wound #1 Midline  Coccyx: Return Appointment in 2 weeks. Off-Loading: Wound #1 Midline Coccyx: Roho cushion for wheelchair - HHRN please order roho cushion or gel cushion for patients wheelchair - whichever she qualifies for KINZIE, WICKES. ([id number]) Turn and reposition every 2 hours Additional Orders / Instructions: Wound #1 Midline Coccyx: Increase protein intake. - please add protein  supplements to patients diet Other: - please add vitamin A, vitamin C and zinc supplements to patients diet Home Health: Wound #1 Midline Coccyx: Continue Home Health Visits - [facility] Nurse may visit PRN to address patient s wound care needs. FACE TO FACE ENCOUNTER: MEDICARE and MEDICAID PATIENTS: I certify that this patient is under my care and that I had a face-to-face encounter that meets the physician face-to-face encounter requirements with this patient on this date. The encounter with the patient was in whole or in part for the following MEDICAL CONDITION: (primary reason for Home Healthcare) MEDICAL NECESSITY: I certify, that based on my findings, NURSING services are a medically necessary home health service. HOME BOUND STATUS: I certify that my clinical findings support that this patient is homebound (i.e., Due to illness or injury, pt requires aid of supportive devices such as crutches, cane, wheelchairs, walkers, the use of special transportation or the assistance of another person to leave their place of residence. There is a normal inability to leave the home and doing so requires considerable and taxing effort. Other absences are for medical reasons / religious services and are infrequent or of short duration when for other reasons). If current dressing causes regression in wound condition, may D/C ordered dressing product/s and apply Normal Saline Moist Dressing daily until next Wound Healing Center / Other MD appointment. Notify Wound Healing Center of regression in wound condition at [phone]. Please direct any NON-WOUND related issues/requests for orders to patient's Primary Care Physician - Dr Vonita Moss #1 I'm going to continue the [facility] as we start appear to be getting a better wound bed. May be coming close to a point where we can consider an alternative primary dressing like silver collagen. I think her tissue around the wound is to fragile to  consider a wound VAC. #2 no overt evidence of surrounding infection that was so concerning over a month ago. At that point, I thought we might require IV antibiotics. #3 as the patient appears to be making clinical improvement. I have again put off the idea of doing imaging of the underlying bone. Electronic Signature(s) Signed: 06/16/2017 4:13:20 PM By: Baltazar Najjar MD Entered By: Baltazar Najjar on 06/16/2017 12:10:25 [name] ([id number]) -------------------------------------------------------------------------------- SuperBill Details Patient Name: [name] Date of Service: 06/16/2017 Medical Record Patient Account Number: [id number] Number: Treating RN: Curtis Sites September 29, 1923 (83 y.o. Other Clinician: Date of Birth/Sex: Female) Treating ROBSON, MICHAEL Primary Care Provider: Vonita Moss Provider/Extender: G Referring Provider: Vonita Moss Weeks in Treatment: 22 Diagnosis Coding ICD-10 Codes Code Description L89.153 Pressure ulcer of sacral region, stage 3 G20 Parkinson's disease Facility Procedures CPT4 Code: [id number] Description: 99213 - WOUND CARE VISIT-LEV 3 EST PT Modifier: Quantity: 1 Physician Procedures CPT4 Code: [id number] Description: 99213 - WC PHYS LEVEL 3 - EST PT ICD-10 Description Diagnosis L89.153 Pressure ulcer of sacral region, stage 3 Modifier: Quantity: 1 Electronic Signature(s) Signed: 06/16/2017 4:13:20 PM By: Baltazar Najjar MD Entered By: Baltazar Najjar on 06/16/2017 12:53:09

## 2017-06-17 NOTE — Progress Notes (Signed)
[name], [name] ([id number]) Visit Report for 06/16/2017 Arrival Information Details Patient Name: [name], [name] Date of Service: 06/16/2017 10:15 AM Medical Record Patient Account Number: [id number] Number: Treating RN: Curtis Sites 06/10/1923 (83 y.o. Other Clinician: Date of Birth/Sex: Female) Treating ROBSON, MICHAEL Primary Care Oliveah Zwack: Vonita Moss Yaresly Menzel/Extender: G Referring Daymond Cordts: Vonita Moss Weeks in Treatment: 22 Visit Information History Since Last Visit Added or deleted any medications: No Patient Arrived: Wheel Chair Any new allergies or adverse reactions: No Arrival Time: 10:27 Had a fall or experienced change in No activities of daily living that may affect Accompanied By: dtr and cg risk of falls: Transfer Assistance: Manual Signs or symptoms of abuse/neglect since last No Patient Identification Verified: Yes visito Secondary Verification Process Yes Hospitalized since last visit: No Completed: Has Dressing in Place as Prescribed: Yes Patient Requires Transmission-Based No Pain Present Now: No Precautions: Patient Has Alerts: No Electronic Signature(s) Signed: 06/16/2017 4:51:53 PM By: Curtis Sites Entered By: Curtis Sites on 06/16/2017 10:28:19 [name] ([id number]) -------------------------------------------------------------------------------- Clinic Level of Care Assessment Details Patient Name: [name] Date of Service: 06/16/2017 10:15 AM Medical Record Patient Account Number: [id number] Number: Treating RN: Curtis Sites 03-Dec-1922 (83 y.o. Other Clinician: Date of Birth/Sex: Female) Treating ROBSON, MICHAEL Primary Care Shoshannah Faubert: Vonita Moss Alilah Mcmeans/Extender: G Referring Emmett Bracknell: Vonita Moss Weeks in Treatment: 22 Clinic Level of Care Assessment Items TOOL 4 Quantity Score  - Use when only an EandM is performed on FOLLOW-UP visit 0 ASSESSMENTS - Nursing Assessment /  Reassessment X - Reassessment of Co-morbidities (includes updates in patient status) 1 10 X - Reassessment of Adherence to Treatment Plan 1 5 ASSESSMENTS - Wound and Skin Assessment / Reassessment X - Simple Wound Assessment / Reassessment - one wound 1 5  - Complex Wound Assessment / Reassessment - multiple wounds 0  - Dermatologic / Skin Assessment (not related to wound area) 0 ASSESSMENTS - Focused Assessment  - Circumferential Edema Measurements - multi extremities 0  - Nutritional Assessment / Counseling / Intervention 0  - Lower Extremity Assessment (monofilament, tuning fork, pulses) 0  - Peripheral Arterial Disease Assessment (using hand held doppler) 0 ASSESSMENTS - Ostomy and/or Continence Assessment and Care X - Incontinence Assessment and Management 1 10  - Ostomy Care Assessment and Management (repouching, etc.) 0 PROCESS - Coordination of Care X - Simple Patient / Family Education for ongoing care 1 15  - Complex (extensive) Patient / Family Education for ongoing care 0  - Staff obtains Chiropractor, Records, Test Results / Process Orders 0  - Staff telephones HHA, Nursing Homes / Clarify orders / etc 0 [name], [name] ([id number])  - Routine Transfer to another Facility (non-emergent condition) 0  - Routine Hospital Admission (non-emergent condition) 0  - New Admissions / Manufacturing engineer / Ordering NPWT, Apligraf, etc. 0  - Emergency Hospital Admission (emergent condition) 0 X - Simple Discharge Coordination 1 10  - Complex (extensive) Discharge Coordination 0 PROCESS - Special Needs  - Pediatric / Minor Patient Management 0  - Isolation Patient Management 0  - Hearing / Language / Visual special needs 0  - Assessment of Community assistance (transportation, D/C planning, etc.) 0 X - Additional assistance / Altered mentation 1 15  - Support Surface(s) Assessment (bed, cushion, seat, etc.) 0 INTERVENTIONS - Wound Cleansing  / Measurement X - Simple Wound Cleansing - one wound 1 5  - Complex Wound Cleansing - multiple wounds 0 X - Wound Imaging (photographs - any number  of wounds) 1 5  - Wound Tracing (instead of photographs) 0 X - Simple Wound Measurement - one wound 1 5  - Complex Wound Measurement - multiple wounds 0 INTERVENTIONS - Wound Dressings X - Small Wound Dressing one or multiple wounds 1 10  - Medium Wound Dressing one or multiple wounds 0  - Large Wound Dressing one or multiple wounds 0 X - Application of Medications - topical 1 5  - Application of Medications - injection 0 [name], [name]. ([id number]) INTERVENTIONS - Miscellaneous  - External ear exam 0  - Specimen Collection (cultures, biopsies, blood, body fluids, etc.) 0  - Specimen(s) / Culture(s) sent or taken to Lab for analysis 0  - Patient Transfer (multiple staff / Michiel Sites Lift / Similar devices) 0  - Simple Staple / Suture removal (25 or less) 0  - Complex Staple / Suture removal (26 or more) 0  - Hypo / Hyperglycemic Management (close monitor of Blood Glucose) 0  - Ankle / Brachial Index (ABI) - do not check if billed separately 0 X - Vital Signs 1 5 Has the patient been seen at the hospital within the last three years: Yes Total Score: 105 Level Of Care: New/Established - Level 3 Electronic Signature(s) Signed: 06/16/2017 4:51:53 PM By: Curtis Sites Entered By: Curtis Sites on 06/16/2017 11:28:14 [name] ([id number]) -------------------------------------------------------------------------------- Encounter Discharge Information Details Patient Name: [name] Date of Service: 06/16/2017 10:15 AM Medical Record Patient Account Number: [id number] Number: Treating RN: Curtis Sites 1922/10/02 (83 y.o. Other Clinician: Date of Birth/Sex: Female) Treating ROBSON, MICHAEL Primary Care Kammie Scioli: Vonita Moss Annsley Akkerman/Extender: G Referring Aleshia Cartelli: Vonita Moss Weeks in Treatment: 22 Encounter Discharge Information Items Discharge Pain Level: 0 Discharge Condition: Stable Ambulatory Status: Wheelchair Discharge Destination: Home Transportation: Private Auto Accompanied By: dtr and cg Schedule Follow-up Appointment: Yes Medication Reconciliation completed and provided to Patient/Care No Miquel Stacks: Provided on Clinical Summary of Care: 06/16/2017 Form Type Recipient Paper Patient ES Electronic Signature(s) Signed: 06/16/2017 4:18:12 PM By: Gwenlyn Perking Entered By: Gwenlyn Perking on 06/16/2017 10:57:44 [name] ([id number]) -------------------------------------------------------------------------------- Multi Wound Chart Details Patient Name: [name] Date of Service: 06/16/2017 10:15 AM Medical Record Patient Account Number: [id number] Number: Treating RN: Curtis Sites [birth year] (83 y.o. Other Clinician: Date of Birth/Sex: Female) Treating ROBSON, MICHAEL Primary Care Abrianna Sidman: Vonita Moss Amelia Burgard/Extender: G Referring Isra Lindy: Vonita Moss Weeks in Treatment: 22 Vital Signs Height(in): Pulse(bpm): 76 Weight(lbs): Blood Pressure 122/56 (mmHg): Body Mass Index(BMI): Temperature(F): 97.2 Respiratory Rate 16 (breaths/min): Photos: [1:No Photos] [N/A:N/A] Wound Location: [1:Coccyx - Midline] [N/A:N/A] Wounding Event: [1:Pressure Injury] [N/A:N/A] Primary Etiology: [1:Pressure Ulcer] [N/A:N/A] Comorbid History: [1:Anemia, Hypertension, Dementia] [N/A:N/A] Date Acquired: [1:12/01/2016] [N/A:N/A] Weeks of Treatment: [1:22] [N/A:N/A] Wound Status: [1:Open] [N/A:N/A] Measurements L x W x D 5.7x5x1.8 [N/A:N/A] (cm) Area (cm) : [1:22.384] [N/A:N/A] Volume (cm) : [1:40.291] [N/A:N/A] % Reduction in Area: [1:-438.70%] [N/A:N/A] % Reduction in Volume: -2324.20% [N/A:N/A] Starting Position 1 1 (o'clock): Ending Position 1 [1:4] (o'clock): Maximum Distance 1 2.7 (cm): Undermining:  [1:Yes] [N/A:N/A] Classification: [1:Category/Stage IV] [N/A:N/A] Exudate Amount: [1:Large] [N/A:N/A] Exudate Type: [1:Purulent] [N/A:N/A] Exudate Color: [1:yellow, brown, green] [N/A:N/A] Foul Odor After [1:Yes] [N/A:N/A] Cleansing: [name], [name] ([id number]) Odor Anticipated Due to No N/A N/A Product Use: Wound Margin: Flat and Intact N/A N/A Granulation Amount: Small (1-33%) N/A N/A Granulation Quality: Red N/A N/A Necrotic Amount: Large (67-100%) N/A N/A Necrotic Tissue: Eschar, Adherent Slough N/A N/A Exposed Structures: Fat Layer (Subcutaneous N/A N/A Tissue) Exposed: Yes  Muscle: Yes Fascia: No Tendon: No Joint: No Bone: No Epithelialization: None N/A N/A Periwound Skin Texture: Induration: Yes N/A N/A Excoriation: No Callus: No Crepitus: No Rash: No Scarring: No Periwound Skin Maceration: No N/A N/A Moisture: Dry/Scaly: No Periwound Skin Color: Erythema: Yes N/A N/A Atrophie Blanche: No Cyanosis: No Ecchymosis: No Hemosiderin Staining: No Mottled: No Pallor: No Rubor: No Erythema Location: Circumferential N/A N/A Temperature: No Abnormality N/A N/A Tenderness on Yes N/A N/A Palpation: Wound Preparation: Ulcer Cleansing: N/A N/A Rinsed/Irrigated with Saline Topical Anesthetic Applied: Other: lidocaine 4% Treatment Notes Wound #1 (Midline Coccyx) 1. Cleansed with: Clean wound with Normal Saline 2. Anesthetic Topical Lidocaine 4% cream to wound bed prior to debridement [name], [name]. ([id number]) 4. Dressing Applied: [name] Ointment 5. Secondary Dressing Applied Bordered Foam Dressing Dry Gauze Electronic Signature(s) Signed: 06/16/2017 4:13:20 PM By: Baltazar Najjar MD Entered By: Baltazar Najjar on 06/16/2017 11:59:14 [name] ([id number]) -------------------------------------------------------------------------------- Multi-Disciplinary Care Plan Details Patient Name: [name] Date of Service: 06/16/2017 10:15  AM Medical Record Patient Account Number: [id number] Number: Treating RN: Curtis Sites 11-04-1922 (83 y.o. Other Clinician: Date of Birth/Sex: Female) Treating ROBSON, MICHAEL Primary Care Starsky Nanna: Vonita Moss Ether Goebel/Extender: G Referring Alfio Loescher: Vonita Moss Weeks in Treatment: 22 Active Inactive ` Abuse / Safety / Falls / Self Care Management Nursing Diagnoses: Impaired physical mobility Potential for falls Goals: Patient will remain injury free Date Initiated: 01/07/2017 Target Resolution Date: 04/03/2017 Goal Status: Active Interventions: Assess fall risk on admission and as needed Notes: ` Nutrition Nursing Diagnoses: Potential for alteratiion in Nutrition/Potential for imbalanced nutrition Goals: Patient/caregiver agrees to and verbalizes understanding of need to use nutritional supplements and/or vitamins as prescribed Date Initiated: 01/07/2017 Target Resolution Date: 04/03/2017 Goal Status: Active Interventions: Assess patient nutrition upon admission and as needed per policy Notes: ` Orientation to the Wound Care Program [name], [name] ([id number]) Nursing Diagnoses: Knowledge deficit related to the wound healing center program Goals: Patient/caregiver will verbalize understanding of the Wound Healing Center Program Date Initiated: 01/07/2017 Target Resolution Date: 04/03/2017 Goal Status: Active Interventions: Provide education on orientation to the wound center Notes: ` Pressure Nursing Diagnoses: Knowledge deficit related to causes and risk factors for pressure ulcer development Goals: Patient will remain free from development of additional pressure ulcers Date Initiated: 01/07/2017 Target Resolution Date: 04/03/2017 Goal Status: Active Interventions: Assess potential for pressure ulcer upon admission and as needed Notes: ` Wound/Skin Impairment Nursing Diagnoses: Impaired tissue integrity Goals: Patient/caregiver will  verbalize understanding of skin care regimen Date Initiated: 01/07/2017 Target Resolution Date: 04/03/2017 Goal Status: Active Ulcer/skin breakdown will have a volume reduction of 30% by week 4 Date Initiated: 01/07/2017 Target Resolution Date: 04/03/2017 Goal Status: Active Ulcer/skin breakdown will have a volume reduction of 50% by week 8 Date Initiated: 01/07/2017 Target Resolution Date: 04/03/2017 Goal Status: Active Ulcer/skin breakdown will have a volume reduction of 80% by week 12 [name], [name] ([id number]) Date Initiated: 01/07/2017 Target Resolution Date: 04/03/2017 Goal Status: Active Ulcer/skin breakdown will heal within 14 weeks Date Initiated: 01/07/2017 Target Resolution Date: 04/03/2017 Goal Status: Active Interventions: Assess patient/caregiver ability to obtain necessary supplies Assess patient/caregiver ability to perform ulcer/skin care regimen upon admission and as needed Assess ulceration(s) every visit Notes: Electronic Signature(s) Signed: 06/16/2017 4:51:53 PM By: Curtis Sites Entered By: Curtis Sites on 06/16/2017 10:53:29 [name] ([id number]) -------------------------------------------------------------------------------- Pain Assessment Details Patient Name: [name] Date of Service: 06/16/2017 10:15 AM Medical Record Patient Account Number: [id number] Number: Treating RN:  Curtis Sites 1922/12/09 (83 y.o. Other Clinician: Date of Birth/Sex: Female) Treating ROBSON, MICHAEL Primary Care Kenyana Husak: Vonita Moss Agapita Savarino/Extender: G Referring Zeeshan Korte: Vonita Moss Weeks in Treatment: 22 Active Problems Location of Pain Severity and Description of Pain Patient Has Paino Patient Unable to Respond Site Locations Pain Management and Medication Current Pain Management: Electronic Signature(s) Signed: 06/16/2017 4:51:53 PM By: Curtis Sites Entered By: Curtis Sites on 06/16/2017 10:28:33 [name]  ([id number]) -------------------------------------------------------------------------------- Patient/Caregiver Education Details Patient Name: [name] Date of Service: 06/16/2017 10:15 AM Medical Record Patient Account Number: [id number] Number: Treating RN: Curtis Sites 1923-03-04 (83 y.o. Other Clinician: Date of Birth/Gender: Female) Treating ROBSON, MICHAEL Primary Care Physician: Vonita Moss Physician/Extender: G Referring Physician: Vonita Moss Weeks in Treatment: 22 Education Assessment Education Provided To: Caregiver Education Topics Provided Wound/Skin Impairment: Handouts: Other: wound care as ordered Methods: Demonstration, Explain/Verbal Responses: State content correctly Electronic Signature(s) Signed: 06/16/2017 4:51:53 PM By: Curtis Sites Entered By: Curtis Sites on 06/16/2017 10:56:36 [name] ([id number]) -------------------------------------------------------------------------------- Wound Assessment Details Patient Name: [name] Date of Service: 06/16/2017 10:15 AM Medical Record Patient Account Number: [id number] Number: Treating RN: Curtis Sites 1922/11/06 (83 y.o. Other Clinician: Date of Birth/Sex: Female) Treating ROBSON, MICHAEL Primary Care Terriah Reggio: Vonita Moss Roschelle Calandra/Extender: G Referring Shavonne Ambroise: Vonita Moss Weeks in Treatment: 22 Wound Status Wound Number: 1 Primary Etiology: Pressure Ulcer Wound Location: Coccyx - Midline Wound Status: Open Wounding Event: Pressure Injury Comorbid History: Anemia, Hypertension, Dementia Date Acquired: 12/01/2016 Weeks Of Treatment: 22 Clustered Wound: No Photos Photo Uploaded By: Curtis Sites on 06/16/2017 13:24:08 Wound Measurements Length: (cm) 5.7 % Reduction in A Width: (cm) 5 % Reduction in V Depth: (cm) 1.8 Epithelializatio Area: (cm) 22.384 Tunneling: Volume: (cm) 40.291 Undermining: Starting Posi Ending  Positi Maximum Dista rea: -438.7% olume: -2324.2% n: None No Yes tion (o'clock): 1 on (o'clock): 4 nce: (cm) 2.7 Wound Description Classification: Category/Stage IV Foul Odor After Wound Margin: Flat and Intact Due to Product Exudate Amount: Large Slough/Fibrino Exudate Type: Purulent Exudate Color: yellow, brown, green [name], [name] ([id number]) Cleansing: Yes Use: No Yes Wound Bed Granulation Amount: Small (1-33%) Exposed Structure Granulation Quality: Red Fascia Exposed: No Necrotic Amount: Large (67-100%) Fat Layer (Subcutaneous Tissue) Exposed: Yes Necrotic Quality: Eschar, Adherent Slough Tendon Exposed: No Muscle Exposed: Yes Necrosis of Muscle: No Joint Exposed: No Bone Exposed: No Periwound Skin Texture Texture Color No Abnormalities Noted: No No Abnormalities Noted: No Callus: No Atrophie Blanche: No Crepitus: No Cyanosis: No Excoriation: No Ecchymosis: No Induration: Yes Erythema: Yes Rash: No Erythema Location: Circumferential Scarring: No Hemosiderin Staining: No Mottled: No Moisture Pallor: No No Abnormalities Noted: No Rubor: No Dry / Scaly: No Maceration: No Temperature / Pain Temperature: No Abnormality Tenderness on Palpation: Yes Wound Preparation Ulcer Cleansing: Rinsed/Irrigated with Saline Topical Anesthetic Applied: Other: lidocaine 4%, Treatment Notes Wound #1 (Midline Coccyx) 1. Cleansed with: Clean wound with Normal Saline 2. Anesthetic Topical Lidocaine 4% cream to wound bed prior to debridement 4. Dressing Applied: [name] Ointment 5. Secondary Dressing Applied Bordered Foam Dressing Dry Gauze Electronic Signature(s) Signed: 06/16/2017 4:51:53 PM By: Curtis Sites Entered By: Curtis Sites on 06/16/2017 10:43:06 [name] ([id number]) [name] ([id number]) -------------------------------------------------------------------------------- Vitals Details Patient Name: [name] Date of  Service: 06/16/2017 10:15 AM Medical Record Patient Account Number: [id number] Number: Treating RN: Curtis Sites 08-Mar-1923 (83 y.o. Other Clinician: Date of Birth/Sex: Female) Treating ROBSON, MICHAEL Primary Care Norinne Jeane: Vonita Moss Hulda Reddix/Extender: G Referring Faun Mcqueen: Dossie Arbour,  MARK Weeks in Treatment: 22 Vital Signs Time Taken: 10:29 Temperature (F): 97.2 Pulse (bpm): 76 Respiratory Rate (breaths/min): 16 Blood Pressure (mmHg): 122/56 Reference Range: 80 - 120 mg / dl Electronic Signature(s) Signed: 06/16/2017 4:51:53 PM By: Curtis Sites Entered By: Curtis Sites on 06/16/2017 10:32:09

## 2017-06-23 ENCOUNTER — Ambulatory Visit: Payer: Medicare Other | Admitting: Internal Medicine

## 2017-06-30 ENCOUNTER — Encounter: Payer: Medicare Other | Attending: Internal Medicine | Admitting: Internal Medicine

## 2017-06-30 DIAGNOSIS — L89153 Pressure ulcer of sacral region, stage 3: Secondary | ICD-10-CM | POA: Diagnosis not present

## 2017-06-30 DIAGNOSIS — G20 Parkinson's disease: Secondary | ICD-10-CM | POA: Insufficient documentation

## 2017-06-30 DIAGNOSIS — F0390 Unspecified dementia without behavioral disturbance: Secondary | ICD-10-CM | POA: Insufficient documentation

## 2017-07-02 NOTE — Progress Notes (Signed)
[name], [name] ([id number]) Visit Report for 06/30/2017 Arrival Information Details Patient Name: Tina, [name] Date of Service: 06/30/2017 10:15 AM Medical Record Patient Account Number: [id number] Number: Treating RN: Curtis Sites 22-May-1923 (83 y.o. Other Clinician: Date of Birth/Sex: Female) Treating ROBSON, MICHAEL Primary Care Khloei Spiker: Vonita Moss Omair Dettmer/Extender: G Referring Davidjames Blansett: Vonita Moss Weeks in Treatment: 24 Visit Information History Since Last Visit Added or deleted any medications: No Patient Arrived: Wheel Chair Any new allergies or adverse No reactions: Arrival Time: 10:15 Had a fall or experienced change in No Accompanied By: dtr and cg activities of daily living that may Transfer Assistance: Manual affect Patient Identification Verified: Yes risk of falls: Secondary Verification Process Yes Signs or symptoms of abuse/neglect No Completed: since last visito Patient Requires Transmission-Based No Hospitalized since last visit: No Precautions: Has Dressing in Place as Yes Patient Has Alerts: No Prescribed: Pain Present Now: Unable to Respond Electronic Signature(s) Signed: 06/30/2017 5:06:38 PM By: Curtis Sites Entered By: Curtis Sites on 06/30/2017 10:16:08 [name] ([id number]) -------------------------------------------------------------------------------- Clinic Level of Care Assessment Details Patient Name: [name] Date of Service: 06/30/2017 10:15 AM Medical Record Patient Account Number: [id number] Number: Treating RN: Curtis Sites 1923/01/16 (83 y.o. Other Clinician: Date of Birth/Sex: Female) Treating ROBSON, MICHAEL Primary Care Naelani Lafrance: Vonita Moss Dennie Vecchio/Extender: G Referring Crosley Stejskal: Vonita Moss Weeks in Treatment: 24 Clinic Level of Care Assessment Items TOOL 4 Quantity Score []  - Use when only an EandM is performed on FOLLOW-UP visit 0 ASSESSMENTS - Nursing  Assessment / Reassessment X - Reassessment of Co-morbidities (includes updates in patient status) 1 10 X - Reassessment of Adherence to Treatment Plan 1 5 ASSESSMENTS - Wound and Skin Assessment / Reassessment X - Simple Wound Assessment / Reassessment - one wound 1 5 []  - Complex Wound Assessment / Reassessment - multiple wounds 0 []  - Dermatologic / Skin Assessment (not related to wound area) 0 ASSESSMENTS - Focused Assessment []  - Circumferential Edema Measurements - multi extremities 0 []  - Nutritional Assessment / Counseling / Intervention 0 []  - Lower Extremity Assessment (monofilament, tuning fork, pulses) 0 []  - Peripheral Arterial Disease Assessment (using hand held doppler) 0 ASSESSMENTS - Ostomy and/or Continence Assessment and Care []  - Incontinence Assessment and Management 0 []  - Ostomy Care Assessment and Management (repouching, etc.) 0 PROCESS - Coordination of Care X - Simple Patient / Family Education for ongoing care 1 15 []  - Complex (extensive) Patient / Family Education for ongoing care 0 []  - Staff obtains Chiropractor, Records, Test Results / Process Orders 0 []  - Staff telephones HHA, Nursing Homes / Clarify orders / etc 0 [name], [name] ([id number]) []  - Routine Transfer to another Facility (non-emergent condition) 0 []  - Routine Hospital Admission (non-emergent condition) 0 []  - New Admissions / Manufacturing engineer / Ordering NPWT, Apligraf, etc. 0 []  - Emergency Hospital Admission (emergent condition) 0 X - Simple Discharge Coordination 1 10 []  - Complex (extensive) Discharge Coordination 0 PROCESS - Special Needs []  - Pediatric / Minor Patient Management 0 []  - Isolation Patient Management 0 []  - Hearing / Language / Visual special needs 0 []  - Assessment of Community assistance (transportation, D/C planning, etc.) 0 X - Additional assistance / Altered mentation 1 15 []  - Support Surface(s) Assessment (bed, cushion, seat, etc.) 0 INTERVENTIONS - Wound  Cleansing / Measurement X - Simple Wound Cleansing - one wound 1 5 []  - Complex Wound Cleansing - multiple wounds 0 X - Wound Imaging (photographs - any  number of wounds) 1 5  - Wound Tracing (instead of photographs) 0 X - Simple Wound Measurement - one wound 1 5  - Complex Wound Measurement - multiple wounds 0 INTERVENTIONS - Wound Dressings X - Small Wound Dressing one or multiple wounds 1 10  - Medium Wound Dressing one or multiple wounds 0  - Large Wound Dressing one or multiple wounds 0  - Application of Medications - topical 0  - Application of Medications - injection 0 [name], [name]. ([id number]) INTERVENTIONS - Miscellaneous  - External ear exam 0  - Specimen Collection (cultures, biopsies, blood, body fluids, etc.) 0  - Specimen(s) / Culture(s) sent or taken to Lab for analysis 0  - Patient Transfer (multiple staff / Michiel Sites Lift / Similar devices) 0  - Simple Staple / Suture removal (25 or less) 0  - Complex Staple / Suture removal (26 or more) 0  - Hypo / Hyperglycemic Management (close monitor of Blood Glucose) 0  - Ankle / Brachial Index (ABI) - do not check if billed separately 0 X - Vital Signs 1 5 Has the patient been seen at the hospital within the last three years: Yes Total Score: 90 Level Of Care: New/Established - Level 3 Electronic Signature(s) Signed: 06/30/2017 5:06:38 PM By: Curtis Sites Entered By: Curtis Sites on 06/30/2017 14:36:04 [name] ([id number]) -------------------------------------------------------------------------------- Encounter Discharge Information Details Patient Name: [name] Date of Service: 06/30/2017 10:15 AM Medical Record Patient Account Number: [id number] Number: Treating RN: Curtis Sites 1923/07/09 (83 y.o. Other Clinician: Date of Birth/Sex: Female) Treating ROBSON, MICHAEL Primary Care Ahmar Pickrell: Vonita Moss Azka Steger/Extender: G Referring Eshawn Coor: Vonita Moss Weeks in Treatment: 24 Encounter Discharge Information Items Discharge Pain Level: 0 Discharge Condition: Stable Ambulatory Status: Wheelchair Discharge Destination: Home Transportation: Private Auto Accompanied By: dtr and cg Schedule Follow-up Appointment: Yes Medication Reconciliation completed and provided to Patient/Care No Nakira Litzau: Provided on Clinical Summary of Care: 06/30/2017 Form Type Recipient Paper Patient ES Electronic Signature(s) Signed: 07/01/2017 2:27:26 PM By: Gwenlyn Perking Entered By: Gwenlyn Perking on 06/30/2017 11:00:14 [name] ([id number]) -------------------------------------------------------------------------------- Multi Wound Chart Details Patient Name: [name] Date of Service: 06/30/2017 10:15 AM Medical Record Patient Account Number: [id number] Number: Treating RN: Curtis Sites 1923-07-09 (83 y.o. Other Clinician: Date of Birth/Sex: Female) Treating ROBSON, MICHAEL Primary Care Brix Brearley: Vonita Moss Takara Sermons/Extender: G Referring Alani Lacivita: Vonita Moss Weeks in Treatment: 24 Vital Signs Height(in): Pulse(bpm): 109 Weight(lbs): Blood Pressure 170/91 (mmHg): Body Mass Index(BMI): Temperature(F): 98.6 Respiratory Rate 16 (breaths/min): Photos: [1:No Photos] [N/A:N/A] Wound Location: [1:Coccyx - Midline] [N/A:N/A] Wounding Event: [1:Pressure Injury] [N/A:N/A] Primary Etiology: [1:Pressure Ulcer] [N/A:N/A] Comorbid History: [1:Anemia, Hypertension, Dementia] [N/A:N/A] Date Acquired: [1:12/01/2016] [N/A:N/A] Weeks of Treatment: [1:24] [N/A:N/A] Wound Status: [1:Open] [N/A:N/A] Measurements L x W x D 5.6x6x1.5 [N/A:N/A] (cm) Area (cm) : [1:26.389] [N/A:N/A] Volume (cm) : [1:39.584] [N/A:N/A] % Reduction in Area: [1:-535.10%] [N/A:N/A] % Reduction in Volume: -2281.70% [N/A:N/A] Starting Position 1 12 (o'clock): Ending Position 1 [1:5] (o'clock): Maximum Distance 1 3.5 (cm): Undermining:  [1:Yes] [N/A:N/A] Classification: [1:Category/Stage IV] [N/A:N/A] Exudate Amount: [1:Large] [N/A:N/A] Exudate Type: [1:Purulent] [N/A:N/A] Exudate Color: [1:yellow, brown, green] [N/A:N/A] Foul Odor After [1:Yes] [N/A:N/A] Cleansing: ANALEI, WHINERY ([id number]) Odor Anticipated Due to No N/A N/A Product Use: Wound Margin: Flat and Intact N/A N/A Granulation Amount: Medium (34-66%) N/A N/A Granulation Quality: Red N/A N/A Necrotic Amount: Medium (34-66%) N/A N/A Necrotic Tissue: Eschar, Adherent Slough N/A N/A Exposed Structures: Fat Layer (Subcutaneous N/A N/A Tissue) Exposed: Yes  Muscle: Yes Fascia: No Tendon: No Joint: No Bone: No Epithelialization: None N/A N/A Periwound Skin Texture: Induration: Yes N/A N/A Excoriation: No Callus: No Crepitus: No Rash: No Scarring: No Periwound Skin Maceration: No N/A N/A Moisture: Dry/Scaly: No Periwound Skin Color: Erythema: Yes N/A N/A Atrophie Blanche: No Cyanosis: No Ecchymosis: No Hemosiderin Staining: No Mottled: No Pallor: No Rubor: No Erythema Location: Circumferential N/A N/A Temperature: No Abnormality N/A N/A Tenderness on Yes N/A N/A Palpation: Wound Preparation: Ulcer Cleansing: N/A N/A Rinsed/Irrigated with Saline Topical Anesthetic Applied: Other: lidocaine 4% Treatment Notes Electronic Signature(s) Signed: 07/01/2017 8:40:51 AM By: Baltazar Najjar MD Entered By: Baltazar Najjar on 06/30/2017 10:58:50 [name] ([id number]) [name], [name] ([id number]) -------------------------------------------------------------------------------- Multi-Disciplinary Care Plan Details Patient Name: [name] Date of Service: 06/30/2017 10:15 AM Medical Record Patient Account Number: [id number] Number: Treating RN: Curtis Sites 09/26/1923 (83 y.o. Other Clinician: Date of Birth/Sex: Female) Treating ROBSON, MICHAEL Primary Care Moss Berry: Vonita Moss Stockton Nunley/Extender: G Referring  Alyse Kathan: Vonita Moss Weeks in Treatment: 24 Active Inactive ` Abuse / Safety / Falls / Self Care Management Nursing Diagnoses: Impaired physical mobility Potential for falls Goals: Patient will remain injury free Date Initiated: 01/07/2017 Target Resolution Date: 04/03/2017 Goal Status: Active Interventions: Assess fall risk on admission and as needed Notes: ` Nutrition Nursing Diagnoses: Potential for alteratiion in Nutrition/Potential for imbalanced nutrition Goals: Patient/caregiver agrees to and verbalizes understanding of need to use nutritional supplements and/or vitamins as prescribed Date Initiated: 01/07/2017 Target Resolution Date: 04/03/2017 Goal Status: Active Interventions: Assess patient nutrition upon admission and as needed per policy Notes: ` Orientation to the Wound Care Program [name], [name] ([id number]) Nursing Diagnoses: Knowledge deficit related to the wound healing center program Goals: Patient/caregiver will verbalize understanding of the Wound Healing Center Program Date Initiated: 01/07/2017 Target Resolution Date: 04/03/2017 Goal Status: Active Interventions: Provide education on orientation to the wound center Notes: ` Pressure Nursing Diagnoses: Knowledge deficit related to causes and risk factors for pressure ulcer development Goals: Patient will remain free from development of additional pressure ulcers Date Initiated: 01/07/2017 Target Resolution Date: 04/03/2017 Goal Status: Active Interventions: Assess potential for pressure ulcer upon admission and as needed Notes: ` Wound/Skin Impairment Nursing Diagnoses: Impaired tissue integrity Goals: Patient/caregiver will verbalize understanding of skin care regimen Date Initiated: 01/07/2017 Target Resolution Date: 04/03/2017 Goal Status: Active Ulcer/skin breakdown will have a volume reduction of 30% by week 4 Date Initiated: 01/07/2017 Target Resolution Date: 04/03/2017 Goal Status:  Active Ulcer/skin breakdown will have a volume reduction of 50% by week 8 Date Initiated: 01/07/2017 Target Resolution Date: 04/03/2017 Goal Status: Active Ulcer/skin breakdown will have a volume reduction of 80% by week 12 [name], [name] ([id number]) Date Initiated: 01/07/2017 Target Resolution Date: 04/03/2017 Goal Status: Active Ulcer/skin breakdown will heal within 14 weeks Date Initiated: 01/07/2017 Target Resolution Date: 04/03/2017 Goal Status: Active Interventions: Assess patient/caregiver ability to obtain necessary supplies Assess patient/caregiver ability to perform ulcer/skin care regimen upon admission and as needed Assess ulceration(s) every visit Notes: Electronic Signature(s) Signed: 06/30/2017 5:06:38 PM By: Curtis Sites Entered By: Curtis Sites on 06/30/2017 10:39:23 [name] ([id number]) -------------------------------------------------------------------------------- Pain Assessment Details Patient Name: [name] Date of Service: 06/30/2017 10:15 AM Medical Record Patient Account Number: [id number] Number: Treating RN: Curtis Sites 09-14-1923 (83 y.o. Other Clinician: Date of Birth/Sex: Female) Treating ROBSON, MICHAEL Primary Care Mehgan Santmyer: Vonita Moss Rosabell Geyer/Extender: G Referring Tunisha Ruland: Vonita Moss Weeks in Treatment: 24 Active Problems Location of Pain Severity and Description of  Pain Patient Has Paino Patient Unable to Respond Site Locations Pain Management and Medication Current Pain Management: Electronic Signature(s) Signed: 06/30/2017 5:06:38 PM By: Curtis Sites Entered By: Curtis Sites on 06/30/2017 10:16:15 [name] ([id number]) -------------------------------------------------------------------------------- Patient/Caregiver Education Details Patient Name: [name] Date of Service: 06/30/2017 10:15 AM Medical Record Patient Account Number: [id number] Number: Treating RN:  Curtis Sites 1923/03/03 (83 y.o. Other Clinician: Date of Birth/Gender: Female) Treating ROBSON, MICHAEL Primary Care Physician: Vonita Moss Physician/Extender: G Referring Physician: Vonita Moss Weeks in Treatment: 24 Education Assessment Education Provided To: Caregiver Education Topics Provided Wound/Skin Impairment: Handouts: Other: wound care as ordered Methods: Demonstration, Explain/Verbal Responses: State content correctly Electronic Signature(s) Signed: 06/30/2017 5:06:38 PM By: Curtis Sites Entered By: Curtis Sites on 06/30/2017 10:41:43 [name] ([id number]) -------------------------------------------------------------------------------- Wound Assessment Details Patient Name: [name] Date of Service: 06/30/2017 10:15 AM Medical Record Patient Account Number: [id number] Number: Treating RN: Curtis Sites [birth year] (83 y.o. Other Clinician: Date of Birth/Sex: Female) Treating ROBSON, MICHAEL Primary Care Kishon Garriga: Vonita Moss Torryn Hudspeth/Extender: G Referring Renette Hsu: Vonita Moss Weeks in Treatment: 24 Wound Status Wound Number: 1 Primary Etiology: Pressure Ulcer Wound Location: Coccyx - Midline Wound Status: Open Wounding Event: Pressure Injury Comorbid History: Anemia, Hypertension, Dementia Date Acquired: 12/01/2016 Weeks Of Treatment: 24 Clustered Wound: No Photos Photo Uploaded By: Curtis Sites on 06/30/2017 16:54:13 Wound Measurements Length: (cm) 5.6 % Reduction in A Width: (cm) 6 % Reduction in V Depth: (cm) 1.5 Epithelializatio Area: (cm) 26.389 Tunneling: Volume: (cm) 39.584 Undermining: Starting Posi Ending Positi Maximum Dista rea: -535.1% olume: -2281.7% n: None No Yes tion (o'clock): 12 on (o'clock): 5 nce: (cm) 3.5 Wound Description Classification: Category/Stage IV Foul Odor After Wound Margin: Flat and Intact Due to Product Exudate Amount: Large Slough/Fibrino Exudate Type:  Purulent Exudate Color: yellow, brown, green [name], [name] ([id number]) Cleansing: Yes Use: No Yes Wound Bed Granulation Amount: Medium (34-66%) Exposed Structure Granulation Quality: Red Fascia Exposed: No Necrotic Amount: Medium (34-66%) Fat Layer (Subcutaneous Tissue) Exposed: Yes Necrotic Quality: Eschar, Adherent Slough Tendon Exposed: No Muscle Exposed: Yes Necrosis of Muscle: No Joint Exposed: No Bone Exposed: No Periwound Skin Texture Texture Color No Abnormalities Noted: No No Abnormalities Noted: No Callus: No Atrophie Blanche: No Crepitus: No Cyanosis: No Excoriation: No Ecchymosis: No Induration: Yes Erythema: Yes Rash: No Erythema Location: Circumferential Scarring: No Hemosiderin Staining: No Mottled: No Moisture Pallor: No No Abnormalities Noted: No Rubor: No Dry / Scaly: No Maceration: No Temperature / Pain Temperature: No Abnormality Tenderness on Palpation: Yes Wound Preparation Ulcer Cleansing: Rinsed/Irrigated with Saline Topical Anesthetic Applied: Other: lidocaine 4%, Treatment Notes Wound #1 (Midline Coccyx) 1. Cleansed with: Clean wound with Normal Saline 2. Anesthetic Topical Lidocaine 4% cream to wound bed prior to debridement 4. Dressing Applied: Prisma Ag 5. Secondary Dressing Applied Bordered Foam Dressing Dry Gauze Electronic Signature(s) Signed: 06/30/2017 5:06:38 PM By: Curtis Sites Entered By: Curtis Sites on 06/30/2017 10:34:01 [name] ([id number]) [name] ([id number]) -------------------------------------------------------------------------------- Vitals Details Patient Name: [name] Date of Service: 06/30/2017 10:15 AM Medical Record Patient Account Number: [id number] Number: Treating RN: Curtis Sites 20-Nov-1922 (83 y.o. Other Clinician: Date of Birth/Sex: Female) Treating ROBSON, MICHAEL Primary Care Garald Rhew: Vonita Moss Kole Hilyard/Extender: G Referring  Raysha Tilmon: Vonita Moss Weeks in Treatment: 24 Vital Signs Time Taken: 10:19 Temperature (F): 98.6 Pulse (bpm): 109 Respiratory Rate (breaths/min): 16 Blood Pressure (mmHg): 170/91 Reference Range: 80 - 120 mg / dl Electronic Signature(s) Signed: 06/30/2017 5:06:38 PM  By: Curtis Sites Entered ByCurtis Sites on 06/30/2017 10:19:22

## 2017-07-02 NOTE — Progress Notes (Signed)
[name] ([id number]) Visit Report for 06/30/2017 HPI Details Patient Name: [name], [name] Date of Service: 06/30/2017 10:15 AM Medical Record Patient Account Number: [id number] Number: Treating RN: [name] [birth year] (83 y.o. Other Clinician: Date of Birth/Sex: Female) Treating [name] Primary Care Provider: Vonita [name] Provider/Extender: G Referring Provider: Vonita [name] Weeks in Treatment: 24 History of Present Illness HPI Description: 01/07/17 this is an 83 year old woman admitted to the clinic today for review of a pressure ulcer on her lower sacrum. She is referred from her primary physician's office after being seen on 3/22 with a 3 cm pressure area. Her daughter and caretaker accompanied her today state that the area first became obvious about a month ago and his since deteriorated. They have recently got [name] a home health involved and have been using [name] to the wound. They have ordered a pressure relief surface for her mattress. They are turning her religiously. They state that she eats well and they've been forcing fluids on her. She is on a multivitamin. Looking through [facility] point last albumin I see was 4.4 on 10/28. The patient has advanced parkinsonism which looks superficially like advanced Parkinson's disease although her daughter tells me she did not ever respond to Sinemet therefore this may have another pathology with signs of parkinsonism. However I think this is largely a mute point currently. She also has dementia and is nonambulatory. Since this started they have been keeping her in bed and turning her religiously every 2 hours. She lives at home in [place] with her husband with 24/7 care giving 01/13/17 santyl change qd. Still will require further debridement. continue santyl. 01/20/17; patient's wound actually looks some better less adherent necrotic surface. There is actually visible granulation. We're using  [name] 01/27/17; better-looking surface but still a lot of necrotic tissue on the base of this wound. The periwound erythema is better than last week we are still using [name]. Her daughter tells [place] that she is still having trouble with the pressure-relief mattress through medical modalities 02/03/18; I had the patient scheduled for a two week followup however her daughter brought her in early concerned for discoloration on 2 areas of the wound circumference. We have bee using santyl 02/10/17;Better looking surface to the wound. Rim appears better suggesting better offloading. Using santyl 02/24/17; change to Silver Collegen last time. Wound appears better. 03/10/17; still using silver collagen religious offloading. Intake is satisfactory per her daughter. Dimension slightly better 03/12/2017 -- Dr. Jannetta [name] patient who had been seen 2 days ago and was doing fairly well. The patient is brought in by her daughter who noticed a new wound just above the previous wound on her sacral area and going on more to the left lateral side. She was very concerned and we asked her to get in for an opinion. 03/17/17; above is noted. The patient has developed a progressive area to the left of her original wound. This seems to this started with a ring of red skin with a more pale interior almost looking fungal. There was a rim of blister through part of the area although this did not look like zoster. They have been applying triamcinolone that was prescribed last week by Dr. Meyer [name] and the area has a fold to a linear band area which is confluent, erythematous and with obvious epidermal swelling but there is no overt tenderness or crepitus. ADDI, [name] ([id number]) She has lost some surface epithelium closer to the wound surface itself and now has a  more superficial wound in this area and the extending erythema goes towards the left buttock. This is well demarcated between involved in normal skin but once again does  not appear to be at all tender. If there is a contact issue here I cannot get the history out of the daughter or the caregiver that are with her. 03/24/17; the patient arrives today with the wound slightly worse slightly more drainage. The bandlike area of erythema that I treated as a possible pineal infection has improved somewhat although proximally is still has confluent erythema without overt tenderness. We have been using silver alginate since the most recent deterioration. To the bandlike degree of erythema we have been using Lotrisone cream 03/31/17; patient arrives today with the wound slightly larger, necrotic surface and surrounding erythema. The bandlike area of erythema that I treated as a possible pineal infection is less swollen but still present I've been using Lotrisone cream on that largely related to the presence of a tinea looking infection when this was first seen. We've been using silver alginate. X-ray that I ordered last week showed no acute bony abnormalities mild fecal impaction. Lab work showed a comprehensive metabolic panel that was normal including an albumin of 3.8. White count was 9.5 hemoglobin 12.3 differential count normal. C-reactive protein was less than 1 and sedimentation rate and 17. The latter 2 values does not support an ongoing bacterial infection. 04/14/17; patient arrives after a 2 week hiatus. Her wound is not in good condition. Although the base of the wound looks stable she still has an erythematous area that was apparently blistered over the weekend. This again points to the left. As our intake nurse pointed out today this is in the area where the tissue folds together and we may need to prevent try to prevent this. Lab work and x-ray that I did to 3 weeks ago were unremarkable including her albumin nevertheless she is an extremely frail condition physically. We have have been using [name] 04/22/17; I changed her to silver alginate because of the  surrounding maceration and moisture last week. The daughter did not like the way the wound looked in the middle of the week and changed her back to [place]. They're putting gauze on top of this. Thinks still using Lotrisone. 05/06/17 on evaluation today patient sacral wound appears to be doing okay and does not seem to be any worse. She is having no significant pain during evaluation today the secondary to mental status she is unable to rate or describe whether she had any pain she was not however flinching. Her daughter states that the wound does appear to be looking better to her. Still we are having difficulty with the skinfold that seems to be closing in on itself at this point. All in all I feel like she is making some good progress in the [name] seems to be the official for her. They do tell me that a refill if we are gonna continue that today. No fevers, chills, nausea, or vomiting noted at this time. 05/13/17 presents today for evaluation concerning her ongoing sacral pressure ulcer. Unfortunately she also has an area of deep tissue injury in the right Ischial region which is starting to show up. The sacral wound also continues to show signs of necrotic tissue overlying and has declined. Overall we really have not seen a significant improvement in the past several months in regard to the sacral wound and now patient is starting to develop a new wound in the right  Ischial region. Obviously this is not trending in the direction that we want to see. No fevers, chills, nausea, or vomiting noted at this time. 05/19/17; I have not seen this wound and almost a month however there is nothing really positive to say about it. Necrotic tissue over the surface which superiorly I think abuts on her sacrum. She has surrounding erythema. I would be surprised if there is not underlying osteomyelitis or soft tissue infection. 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 She apparently eats well per  description although I wonder about this looking at her. Lab work I did probably 4 weeks ago however was really quite normal including a serum albumin 05/26/17; culture I did last week grew Escherichia coli and methicillin sensitive staph aureus which should've been well covered by the Augmentin and ciprofloxacin. Indeed the erythema around the wound in the bed of the wound looks somewhat better. Her intake is still satisfactory. They now have a near fluidized bed 06/02/17; they completed the antibiotics last Friday. Using collagen. Daughter still reports eating and drinking well. There is less visualized erythema around the wound. [name], [name] ([id number]) 06/16/17; large pressure ulcer over her lower sacrum and coccyx. Using [name] to the wound bed. 06/30/17; certainly no change in dimensions of this large stage III wound over her sacrum and coccyx. They've been using [name]. There is no exposed bone. She has a candidal/tinea area in the right inguinal area. Other than that her daughter relates that she is eating and drinking well there changing her positioning to make sure the areas offloaded Electronic Signature(s) Signed: 07/01/2017 8:40:51 AM By: Baltazar Najjar MD Entered By: Baltazar Najjar on 06/30/2017 10:59:58 [name] ([id number]) -------------------------------------------------------------------------------- Physical Exam Details Patient Name: [name] Date of Service: 06/30/2017 10:15 AM Medical Record Patient Account Number: [id number] Number: Treating RN: [name] [birth year] (83 y.o. Other Clinician: Date of Birth/Sex: Female) Treating [name] Primary Care Provider: Vonita [name] Provider/Extender: G Referring Provider: Vonita [name] Weeks in Treatment: 24 Constitutional Patient is hypertensive.. Pulse regular and within target range for patient.Marland Kitchen Respirations regular, non-labored and within target range.. Temperature is normal  and within the target range for the patient.Marland Kitchen appears in no distress. Eyes Conjunctivae clear. No discharge. Ears, Nose, Mouth, and Throat Mucous membranes are moist. Respiratory Respiratory effort is easy and symmetric bilaterally. Rate is normal at rest and on room air.. Gastrointestinal (GI) Abdomen is soft and non-distended without masses or tenderness. Bowel sounds active in all quadrants.. No liver or spleen enlargement or tenderness.. Notes Wound exam; extensive wound over the lower sacrum and coccyx. There is still some adherent slough but most of this looks like reasonable granulation. There is no exposed bone no surrounding soft tissue infection. Electronic Signature(s) Signed: 07/01/2017 8:40:51 AM By: Baltazar Najjar MD Entered By: Baltazar Najjar on 06/30/2017 11:01:14 [name] ([id number]) -------------------------------------------------------------------------------- Physician Orders Details Patient Name: [name] Date of Service: 06/30/2017 10:15 AM Medical Record Patient Account Number: [id number] Number: Treating RN: [name] Oct 03, 1922 (83 y.o. Other Clinician: Date of Birth/Sex: Female) Treating [name] Primary Care Provider: Vonita [name] Provider/Extender: G Referring Provider: Vonita [name] Weeks in Treatment: 61 Verbal / Phone Orders: No Diagnosis Coding Wound Cleansing Wound #1 Midline Coccyx o Clean wound with Normal Saline. o May Shower, gently pat wound dry prior to applying new dressing. Anesthetic Wound #1 Midline Coccyx o Topical Lidocaine 4% cream applied to wound bed prior to debridement - in clinic Skin Barriers/Peri-Wound  Care Wound #1 Midline Coccyx o Skin Prep Primary Wound Dressing Wound #1 Midline Coccyx o Prisma Ag Secondary Dressing Wound #1 Midline Coccyx o Saline moistened gauze - packed lightly into wound o Boardered Foam Dressing - silicone bordered dressing due to  skin breakdown Dressing Change Frequency Wound #1 Midline Coccyx o Change dressing every day. Follow-up Appointments Wound #1 Midline Coccyx o Return Appointment in 2 weeks. Off-Loading Wound #1 Midline Coccyx WILLODEAN, LEVEN. ([id number]) o Roho cushion for wheelchair - [facility] please order roho cushion or gel cushion for patients wheelchair - whichever she qualifies for o Turn and reposition every 2 hours Additional Orders / Instructions Wound #1 Midline Coccyx o Increase protein intake. - please Tina protein supplements to patients diet o Other: - please Tina vitamin A, vitamin C and zinc supplements to patients diet Home Health Wound #1 Midline Coccyx o Continue Home Health Visits - [facility] o Home Health Nurse may visit PRN to address patientos wound care needs. o FACE TO FACE ENCOUNTER: MEDICARE and MEDICAID PATIENTS: I certify that this patient is under my care and that I had a face-to-face encounter that meets the physician face-to-face encounter requirements with this patient on this date. The encounter with the patient was in whole or in part for the following MEDICAL CONDITION: (primary reason for Home Healthcare) MEDICAL NECESSITY: I certify, that based on my findings, NURSING services are a medically necessary home health service. HOME BOUND STATUS: I certify that my clinical findings support that this patient is homebound (i.e., Due to illness or injury, pt requires aid of supportive devices such as crutches, cane, wheelchairs, walkers, the use of special transportation or the assistance of another person to leave their place of residence. There is a normal inability to leave the home and doing so requires considerable and taxing effort. Other absences are for medical reasons / religious services and are infrequent or of short duration when for other reasons). o If current dressing causes regression in wound condition, may D/C ordered dressing  product/s and apply Normal Saline Moist Dressing daily until next Wound Healing Center / Other MD appointment. Notify Wound Healing Center of regression in wound condition at [phone]. o Please direct any NON-WOUND related issues/requests for orders to patient's Primary Care Physician - Dr [name] Electronic Signature(s) Signed: 06/30/2017 5:06:38 PM By: [name] Signed: 07/01/2017 8:40:51 AM By: Baltazar Najjar MD Entered By: [name] on 06/30/2017 10:42:36 [name] ([id number]) -------------------------------------------------------------------------------- Problem List Details Patient Name: [name] Date of Service: 06/30/2017 10:15 AM Medical Record Patient Account Number: [id number] Number: Treating RN: [name] 1923-02-04 (83 y.o. Other Clinician: Date of Birth/Sex: Female) Treating [name] Primary Care Provider: Vonita [name] Provider/Extender: G Referring Provider: Vonita [name] Weeks in Treatment: 24 Active Problems ICD-10 Encounter Code Description Active Date Diagnosis L89.153 Pressure ulcer of sacral region, stage 3 01/07/2017 Yes G20 Parkinson's disease 01/07/2017 Yes Inactive Problems Resolved Problems Electronic Signature(s) Signed: 07/01/2017 8:40:51 AM By: Baltazar Najjar MD Entered By: Baltazar Najjar on 06/30/2017 10:58:45 [name] ([id number]) -------------------------------------------------------------------------------- Progress Note Details Patient Name: [name] Date of Service: 06/30/2017 10:15 AM Medical Record Patient Account Number: [id number] Number: Treating RN: [name] 07-04-1923 (83 y.o. Other Clinician: Date of Birth/Sex: Female) Treating [name] Primary Care Provider: Vonita [name] Provider/Extender: G Referring Provider: Vonita [name] Weeks in Treatment: 24 Subjective History of Present Illness (HPI) 01/07/17 this is a  83 year old woman admitted to the clinic today for review of a  pressure ulcer on her lower sacrum. She is referred from her primary physician's office after being seen on 3/22 with a 3 cm pressure area. Her daughter and caretaker accompanied her today state that the area first became obvious about a month ago and his since deteriorated. They have recently got [name] a home health involved and have been using [name] to the wound. They have ordered a pressure relief surface for her mattress. They are turning her religiously. They state that she eats well and they've been forcing fluids on her. She is on a multivitamin. Looking through [facility] point last albumin I see was 4.4 on 10/28. The patient has advanced parkinsonism which looks superficially like advanced Parkinson's disease although her daughter tells me she did not ever respond to Sinemet therefore this may have another pathology with signs of parkinsonism. However I think this is largely a mute point currently. She also has dementia and is nonambulatory. Since this started they have been keeping her in bed and turning her religiously every 2 hours. She lives at home in [place] with her husband with 24/7 care giving 01/13/17 santyl change qd. Still will require further debridement. continue santyl. 01/20/17; patient's wound actually looks some better less adherent necrotic surface. There is actually visible granulation. We're using [name] 01/27/17; better-looking surface but still a lot of necrotic tissue on the base of this wound. The periwound erythema is better than last week we are still using [name]. Her daughter tells [place] that she is still having trouble with the pressure-relief mattress through medical modalities 02/03/18; I had the patient scheduled for a two week followup however her daughter brought her in early concerned for discoloration on 2 areas of the wound circumference. We have bee using santyl 02/10/17;Better looking  surface to the wound. Rim appears better suggesting better offloading. Using santyl 02/24/17; change to Silver Collegen last time. Wound appears better. 03/10/17; still using silver collagen religious offloading. Intake is satisfactory per her daughter. Dimension slightly better 03/12/2017 -- Dr. Jannetta [name] patient who had been seen 2 days ago and was doing fairly well. The patient is brought in by her daughter who noticed a new wound just above the previous wound on her sacral area and going on more to the left lateral side. She was very concerned and we asked her to get in for an opinion. 03/17/17; above is noted. The patient has developed a progressive area to the left of her original wound. This seems to this started with a ring of red skin with a more pale interior almost looking fungal. There was a rim of blister through part of the area although this did not look like zoster. They have been applying triamcinolone that was prescribed last week by Dr. Meyer [name] and the area has a fold to a linear band area which is confluent, erythematous and with obvious epidermal swelling but there is no overt tenderness or crepitus. She has lost some surface epithelium closer to the wound surface itself and now has a more superficial wound in this area and the extending erythema goes towards the left buttock. This is well demarcated ELONI, DARIUS. ([id number]) between involved in normal skin but once again does not appear to be at all tender. If there is a contact issue here I cannot get the history out of the daughter or the caregiver that are with her. 03/24/17; the patient arrives today with the wound slightly worse slightly more drainage. The bandlike area of erythema that I treated as a  possible pineal infection has improved somewhat although proximally is still has confluent erythema without overt tenderness. We have been using silver alginate since the most recent deterioration. To the bandlike degree of  erythema we have been using Lotrisone cream 03/31/17; patient arrives today with the wound slightly larger, necrotic surface and surrounding erythema. The bandlike area of erythema that I treated as a possible pineal infection is less swollen but still present I've been using Lotrisone cream on that largely related to the presence of a tinea looking infection when this was first seen. We've been using silver alginate. X-ray that I ordered last week showed no acute bony abnormalities mild fecal impaction. Lab work showed a comprehensive metabolic panel that was normal including an albumin of 3.8. White count was 9.5 hemoglobin 12.3 differential count normal. C-reactive protein was less than 1 and sedimentation rate and 17. The latter 2 values does not support an ongoing bacterial infection. 04/14/17; patient arrives after a 2 week hiatus. Her wound is not in good condition. Although the base of the wound looks stable she still has an erythematous area that was apparently blistered over the weekend. This again points to the left. As our intake nurse pointed out today this is in the area where the tissue folds together and we may need to prevent try to prevent this. Lab work and x-ray that I did to 3 weeks ago were unremarkable including her albumin nevertheless she is an extremely frail condition physically. We have have been using [name] 04/22/17; I changed her to silver alginate because of the surrounding maceration and moisture last week. The daughter did not like the way the wound looked in the middle of the week and changed her back to [place]. They're putting gauze on top of this. Thinks still using Lotrisone. 05/06/17 on evaluation today patient sacral wound appears to be doing okay and does not seem to be any worse. She is having no significant pain during evaluation today the secondary to mental status she is unable to rate or describe whether she had any pain she was not however flinching. Her  daughter states that the wound does appear to be looking better to her. Still we are having difficulty with the skinfold that seems to be closing in on itself at this point. All in all I feel like she is making some good progress in the [name] seems to be the official for her. They do tell me that a refill if we are gonna continue that today. No fevers, chills, nausea, or vomiting noted at this time. 05/13/17 presents today for evaluation concerning her ongoing sacral pressure ulcer. Unfortunately she also has an area of deep tissue injury in the right Ischial region which is starting to show up. The sacral wound also continues to show signs of necrotic tissue overlying and has declined. Overall we really have not seen a significant improvement in the past several months in regard to the sacral wound and now patient is starting to develop a new wound in the right Ischial region. Obviously this is not trending in the direction that we want to see. No fevers, chills, nausea, or vomiting noted at this time. 05/19/17; I have not seen this wound and almost a month however there is nothing really positive to say about it. Necrotic tissue over the surface which superiorly I think abuts on her sacrum. She has surrounding erythema. I would be surprised if there is not underlying osteomyelitis or soft tissue infection. This is a very  frail woman with end-stage dementia. She apparently eats well per description although I wonder about this looking at her. Lab work I did probably 4 weeks ago however was really quite normal including a serum albumin 05/26/17; culture I did last week grew Escherichia coli and methicillin sensitive staph aureus which should've been well covered by the Augmentin and ciprofloxacin. Indeed the erythema around the wound in the bed of the wound looks somewhat better. Her intake is still satisfactory. They now have a near fluidized bed 06/02/17; they completed the antibiotics last Friday.  Using collagen. Daughter still reports eating and drinking well. There is less visualized erythema around the wound. 06/16/17; large pressure ulcer over her lower sacrum and coccyx. Using [name] to the wound bed. 06/30/17; certainly no change in dimensions of this large stage III wound over her sacrum and coccyx. [name], [name] ([id number]) They've been using [name]. There is no exposed bone. She has a candidal/tinea area in the right inguinal area. Other than that her daughter relates that she is eating and drinking well there changing her positioning to make sure the areas offloaded Objective Constitutional Patient is hypertensive.. Pulse regular and within target range for patient.Marland Kitchen Respirations regular, non-labored and within target range.. Temperature is normal and within the target range for the patient.Marland Kitchen appears in no distress. Vitals Time Taken: 10:19 AM, Temperature: 98.6 F, Pulse: 109 bpm, Respiratory Rate: 16 breaths/min, Blood Pressure: 170/91 mmHg. Eyes Conjunctivae clear. No discharge. Ears, Nose, Mouth, and Throat Mucous membranes are moist. Respiratory Respiratory effort is easy and symmetric bilaterally. Rate is normal at rest and on room air.. Gastrointestinal (GI) Abdomen is soft and non-distended without masses or tenderness. Bowel sounds active in all quadrants.. No liver or spleen enlargement or tenderness.. General Notes: Wound exam; extensive wound over the lower sacrum and coccyx. There is still some adherent slough but most of this looks like reasonable granulation. There is no exposed bone no surrounding soft tissue infection. Integumentary (Hair, Skin) Wound #1 status is Open. Original cause of wound was Pressure Injury. The wound is located on the Midline Coccyx. The wound measures 5.6cm length x 6cm width x 1.5cm depth; 26.389cm^2 area and 39.584cm^3 volume. There is muscle and Fat Layer (Subcutaneous Tissue) Exposed exposed. There is no tunneling noted,  however, there is undermining starting at 12:00 and ending at 5:00 with a maximum distance of 3.5cm. There is a large amount of purulent drainage noted. Foul odor after cleansing was noted. The wound margin is flat and intact. There is medium (34-66%) red granulation within the wound bed. There is a medium (34-66%) amount of necrotic tissue within the wound bed including Eschar and Adherent [name], [name]. ([id number]) Slough. The periwound skin appearance exhibited: Induration, Erythema. The periwound skin appearance did not exhibit: Callus, Crepitus, Excoriation, Rash, Scarring, Dry/Scaly, Maceration, Atrophie Blanche, Cyanosis, Ecchymosis, Hemosiderin Staining, Mottled, Pallor, Rubor. The surrounding wound skin color is noted with erythema which is circumferential. Periwound temperature was noted as No Abnormality. The periwound has tenderness on palpation. Assessment Active Problems ICD-10 L89.153 - Pressure ulcer of sacral region, stage 3 G20 - Parkinson's disease Plan Wound Cleansing: Wound #1 Midline Coccyx: Clean wound with Normal Saline. May Shower, gently pat wound dry prior to applying new dressing. Anesthetic: Wound #1 Midline Coccyx: Topical Lidocaine 4% cream applied to wound bed prior to debridement - in clinic Skin Barriers/Peri-Wound Care: Wound #1 Midline Coccyx: Skin Prep Primary Wound Dressing: Wound #1 Midline Coccyx: Prisma Ag Secondary Dressing: Wound #1 Midline Coccyx:  Saline moistened gauze - packed lightly into wound Boardered Foam Dressing - silicone bordered dressing due to skin breakdown Dressing Change Frequency: Wound #1 Midline Coccyx: Change dressing every day. Follow-up Appointments: Wound #1 Midline Coccyx: Return Appointment in 2 weeks. Off-Loading: Wound #1 Midline Coccyx: Roho cushion for wheelchair - HHRN please order roho cushion or gel cushion for patients wheelchair - [name], [name]. ([id number]) whichever she qualifies for Turn  and reposition every 2 hours Additional Orders / Instructions: Wound #1 Midline Coccyx: Increase protein intake. - please Tina protein supplements to patients diet Other: - please Tina vitamin A, vitamin C and zinc supplements to patients diet Home Health: Wound #1 Midline Coccyx: Continue Home Health Visits - [facility] Nurse may visit PRN to address patient s wound care needs. FACE TO FACE ENCOUNTER: MEDICARE and MEDICAID PATIENTS: I certify that this patient is under my care and that I had a face-to-face encounter that meets the physician face-to-face encounter requirements with this patient on this date. The encounter with the patient was in whole or in part for the following MEDICAL CONDITION: (primary reason for Home Healthcare) MEDICAL NECESSITY: I certify, that based on my findings, NURSING services are a medically necessary home health service. HOME BOUND STATUS: I certify that my clinical findings support that this patient is homebound (i.e., Due to illness or injury, pt requires aid of supportive devices such as crutches, cane, wheelchairs, walkers, the use of special transportation or the assistance of another person to leave their place of residence. There is a normal inability to leave the home and doing so requires considerable and taxing effort. Other absences are for medical reasons / religious services and are infrequent or of short duration when for other reasons). If current dressing causes regression in wound condition, may D/C ordered dressing product/s and apply Normal Saline Moist Dressing daily until next Wound Healing Center / Other MD appointment. Notify Wound Healing Center of regression in wound condition at [phone]. Please direct any NON-WOUND related issues/requests for orders to patient's Primary Care Physician - Dr [name] #1 I change the dressing to silver collagen, hydrogel, moist gauze border foam #2 consider a wound VAC #3 follow-up in  2 weeks Electronic Signature(s) Signed: 07/01/2017 8:40:51 AM By: Baltazar Najjar MD Entered By: Baltazar Najjar on 06/30/2017 11:01:49 [name] ([id number]) -------------------------------------------------------------------------------- SuperBill Details Patient Name: [name] Date of Service: 06/30/2017 Medical Record Patient Account Number: [id number] Number: Treating RN: [name] 1923-09-22 (83 y.o. Other Clinician: Date of Birth/Sex: Female) Treating [name] Primary Care Provider: Vonita [name] Provider/Extender: G Referring Provider: Vonita [name] Weeks in Treatment: 24 Diagnosis Coding ICD-10 Codes Code Description L89.153 Pressure ulcer of sacral region, stage 3 G20 Parkinson's disease Facility Procedures CPT4 Code: [id number] Description: 99213 - WOUND CARE VISIT-LEV 3 EST PT Modifier: Quantity: 1 Electronic Signature(s) Signed: 06/30/2017 5:06:38 PM By: [name] Signed: 07/01/2017 8:40:51 AM By: Baltazar Najjar MD Entered By: [name] on 06/30/2017 14:36:17

## 2017-07-07 ENCOUNTER — Ambulatory Visit: Payer: Medicare Other | Admitting: Internal Medicine

## 2017-07-14 ENCOUNTER — Encounter: Payer: Medicare Other | Admitting: Internal Medicine

## 2017-07-14 DIAGNOSIS — L89153 Pressure ulcer of sacral region, stage 3: Secondary | ICD-10-CM | POA: Diagnosis not present

## 2017-07-15 NOTE — Progress Notes (Signed)
Tina, [name] ([id number]) Visit Report for 07/14/2017 HPI Details Patient Name: [name], [name] Date of Service: 07/14/2017 10:15 AM Medical Record Patient Account Number: [id number] Number: Treating RN: Curtis Sites [birth year] (83 y.o. Other Clinician: Date of Birth/Sex: Female) Treating Jeanice Dempsey Primary Care Provider: Vonita Moss Provider/Extender: G Referring Provider: Vonita Moss Weeks in Treatment: 26 History of Present Illness HPI Description: 01/07/17 this is a 83 year old woman admitted to the clinic today for review of a pressure ulcer on her lower sacrum. She is referred from her primary physician's office after being seen on 3/22 with a 3 cm pressure area. Her daughter and caretaker accompanied her today state that the area first became obvious about a month ago and his since deteriorated. They have recently got [name] a home health involved and have been using [name] to the wound. They have ordered a pressure relief surface for her mattress. They are turning her religiously. They state that she eats well and they've been forcing fluids on her. She is on a multivitamin. Looking through [facility] point last albumin I see was 4.4 on 10/28. The patient has advanced parkinsonism which looks superficially like advanced Parkinson's disease although her daughter tells me she did not ever respond to Sinemet therefore this may have another pathology with signs of parkinsonism. However I think this is largely a mute point currently. She also has dementia and is nonambulatory. Since this started they have been keeping her in bed and turning her religiously every 2 hours. She lives at home in [place] with her husband with 24/7 care giving 01/13/17 santyl change qd. Still will require further debridement. continue santyl. 01/20/17; patient's wound actually looks some better less adherent necrotic surface. There is actually visible granulation. We're using  [name] 01/27/17; better-looking surface but still a lot of necrotic tissue on the base of this wound. The periwound erythema is better than last week we are still using [name]. Her daughter tells [place] that she is still having trouble with the pressure-relief mattress through medical modalities 02/03/18; I had the patient scheduled for a two week followup however her daughter brought her in early concerned for discoloration on 2 areas of the wound circumference. We have bee using santyl 02/10/17;Better looking surface to the wound. Rim appears better suggesting better offloading. Using santyl 02/24/17; change to Silver Collegen last time. Wound appears better. 03/10/17; still using silver collagen religious offloading. Intake is satisfactory per her daughter. Dimension slightly better 03/12/2017 -- Dr. Jannetta Quint patient who had been seen 2 days ago and was doing fairly well. The patient is brought in by her daughter who noticed a new wound just above the previous wound on her sacral area and going on more to the left lateral side. She was very concerned and we asked her to get in for an opinion. 03/17/17; above is noted. The patient has developed a progressive area to the left of her original wound. This seems to this started with a ring of red skin with a more pale interior almost looking fungal. There was a rim of blister through part of the area although this did not look like zoster. They have been applying triamcinolone that was prescribed last week by Dr. Meyer Russel and the area has a fold to a linear band area which is confluent, erythematous and with obvious epidermal swelling but there is no overt tenderness or crepitus. Tina, [name] ([id number]) She has lost some surface epithelium closer to the wound surface itself and now has a  more superficial wound in this area and the extending erythema goes towards the left buttock. This is well demarcated between involved in normal skin but once again does  not appear to be at all tender. If there is a contact issue here I cannot get the history out of the daughter or the caregiver that are with her. 03/24/17; the patient arrives today with the wound slightly worse slightly more drainage. The bandlike area of erythema that I treated as a possible pineal infection has improved somewhat although proximally is still has confluent erythema without overt tenderness. We have been using silver alginate since the most recent deterioration. To the bandlike degree of erythema we have been using Lotrisone cream 03/31/17; patient arrives today with the wound slightly larger, necrotic surface and surrounding erythema. The bandlike area of erythema that I treated as a possible pineal infection is less swollen but still present I've been using Lotrisone cream on that largely related to the presence of a tinea looking infection when this was first seen. We've been using silver alginate. X-ray that I ordered last week showed no acute bony abnormalities mild fecal impaction. Lab work showed a comprehensive metabolic panel that was normal including an albumin of 3.8. White count was 9.5 hemoglobin 12.3 differential count normal. C-reactive protein was less than 1 and sedimentation rate and 17. The latter 2 values does not support an ongoing bacterial infection. 04/14/17; patient arrives after a 2 week hiatus. Her wound is not in good condition. Although the base of the wound looks stable she still has an erythematous area that was apparently blistered over the weekend. This again points to the left. As our intake nurse pointed out today this is in the area where the tissue folds together and we may need to prevent try to prevent this. Lab work and x-ray that I did to 3 weeks ago were unremarkable including her albumin nevertheless she is an extremely frail condition physically. We have have been using [name] 04/22/17; I changed her to silver alginate because of the  surrounding maceration and moisture last week. The daughter did not like the way the wound looked in the middle of the week and changed her back to [place]. They're putting gauze on top of this. Thinks still using Lotrisone. 05/06/17 on evaluation today patient sacral wound appears to be doing okay and does not seem to be any worse. She is having no significant pain during evaluation today the secondary to mental status she is unable to rate or describe whether she had any pain she was not however flinching. Her daughter states that the wound does appear to be looking better to her. Still we are having difficulty with the skinfold that seems to be closing in on itself at this point. All in all I feel like she is making some good progress in the [name] seems to be the official for her. They do tell me that a refill if we are gonna continue that today. No fevers, chills, nausea, or vomiting noted at this time. 05/13/17 presents today for evaluation concerning her ongoing sacral pressure ulcer. Unfortunately she also has an area of deep tissue injury in the right Ischial region which is starting to show up. The sacral wound also continues to show signs of necrotic tissue overlying and has declined. Overall we really have not seen a significant improvement in the past several months in regard to the sacral wound and now patient is starting to develop a new wound in the right  Ischial region. Obviously this is not trending in the direction that we want to see. No fevers, chills, nausea, or vomiting noted at this time. 05/19/17; I have not seen this wound and almost a month however there is nothing really positive to say about it. Necrotic tissue over the surface which superiorly I think abuts on her sacrum. She has surrounding erythema. I would be surprised if there is not underlying osteomyelitis or soft tissue infection. 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 She apparently eats well per  description although I wonder about this looking at her. Lab work I did probably 4 weeks ago however was really quite normal including a serum albumin 05/26/17; culture I did last week grew Escherichia coli and methicillin sensitive staph aureus which should've been well covered by the Augmentin and ciprofloxacin. Indeed the erythema around the wound in the bed of the wound looks somewhat better. Her intake is still satisfactory. They now have a near fluidized bed 06/02/17; they completed the antibiotics last Friday. Using collagen. Daughter still reports eating and drinking well. There is less visualized erythema around the wound. [name], [name] ([id number]) 06/16/17; large pressure ulcer over her lower sacrum and coccyx. Using [name] to the wound bed. 06/30/17; certainly no change in dimensions of this large stage III wound over her sacrum and coccyx. They've been using [name]. There is no exposed bone. She has a candidal/tinea area in the right inguinal area. Other than that her daughter relates that she is eating and drinking well there changing her positioning to make sure the areas offloaded 07/14/17; no major change in the dimensions of this large stage III wound. Initially a smaller wound that became secondarily infected causing significant tissue breakdown although it is been stable in the last several weeks. Tunneling superiorly at roughly 1:00 no change here either. There is no bone palpable. Both the patient's daughter and caretaker states that she eats well. I have not rechecked her blood work Psychologist, prison and probation services) Signed: 07/14/2017 4:50:14 PM By: Baltazar Najjar MD Entered By: Baltazar Najjar on 07/14/2017 10:54:40 [name] ([id number]) -------------------------------------------------------------------------------- Physical Exam Details Patient Name: [name] Date of Service: 07/14/2017 10:15 AM Medical Record Patient Account Number:  [id number] Number: Treating RN: Curtis Sites 07/29/1923 (83 y.o. Other Clinician: Date of Birth/Sex: Female) Treating Blessing Ozga Primary Care Provider: Vonita Moss Provider/Extender: G Referring Provider: Vonita Moss Weeks in Treatment: 26 Constitutional Sitting or standing Blood Pressure is within target range for patient.. Pulse regular and within target range for patient.Marland Kitchen Respirations regular, non-labored and within target range.. Temperature is normal and within the target range for the patient.. Eyes Conjunctivae clear. No discharge. Respiratory Respiratory effort is easy and symmetric bilaterally. Rate is normal at rest and on room air.. Cardiovascular . Gastrointestinal (GI) Abdomen is soft and non-distended without masses or tenderness. Bowel sounds active in all quadrants.. Genitourinary (GU) Bladder without fullness, masses or tenderness.Marland Kitchen Psychiatric Nonverbal. Notes Wound exam; extensive wound over the lower sacrum and coccyx. There is only inferiorly at roughly 1 to 2:00 no palpable bone here. Most of the surface of this looks like reasonable granulation. I switched her to Silver collagen last week Electronic Signature(s) Signed: 07/14/2017 4:50:14 PM By: Baltazar Najjar MD Entered By: Baltazar Najjar on 07/14/2017 10:57:12 [name] ([id number]) -------------------------------------------------------------------------------- Physician Orders Details Patient Name: [name] Date of Service: 07/14/2017 10:15 AM Medical Record Patient Account Number: [id number] Number: Treating RN: Curtis Sites 12/29/1922 (83 y.o. Other Clinician: Date  of Birth/Sex: Female) Treating Lottie Siska Primary Care Provider: Vonita Moss Provider/Extender: G Referring Provider: Vonita Moss Weeks in Treatment: 31 Verbal / Phone Orders: No Diagnosis Coding Wound Cleansing Wound #1 Midline Coccyx o Clean wound with Normal  Saline. o May Shower, gently pat wound dry prior to applying new dressing. Anesthetic Wound #1 Midline Coccyx o Topical Lidocaine 4% cream applied to wound bed prior to debridement - in clinic Skin Barriers/Peri-Wound Care Wound #1 Midline Coccyx o Skin Prep Primary Wound Dressing Wound #1 Midline Coccyx o Prisma Ag Secondary Dressing Wound #1 Midline Coccyx o Saline moistened gauze - packed lightly into wound o Boardered Foam Dressing - silicone bordered dressing due to skin breakdown Dressing Change Frequency Wound #1 Midline Coccyx o Change dressing every day. Follow-up Appointments Wound #1 Midline Coccyx o Return Appointment in 2 weeks. Off-Loading Wound #1 Midline Coccyx [name], DEEG. ([id number]) o Roho cushion for wheelchair - [facility] please order roho cushion or gel cushion for patients wheelchair - whichever she qualifies for o Turn and reposition every 2 hours Additional Orders / Instructions Wound #1 Midline Coccyx o Increase protein intake. - please add protein supplements to patients diet o Other: - please add vitamin A, vitamin C and zinc supplements to patients diet Home Health Wound #1 Midline Coccyx o Continue Home Health Visits - [facility] o Home Health Nurse may visit PRN to address patientos wound care needs. o FACE TO FACE ENCOUNTER: MEDICARE and MEDICAID PATIENTS: I certify that this patient is under my care and that I had a face-to-face encounter that meets the physician face-to-face encounter requirements with this patient on this date. The encounter with the patient was in whole or in part for the following MEDICAL CONDITION: (primary reason for Home Healthcare) MEDICAL NECESSITY: I certify, that based on my findings, NURSING services are a medically necessary home health service. HOME BOUND STATUS: I certify that my clinical findings support that this patient is homebound (i.e., Due to illness or injury, pt requires aid  of supportive devices such as crutches, cane, wheelchairs, walkers, the use of special transportation or the assistance of another person to leave their place of residence. There is a normal inability to leave the home and doing so requires considerable and taxing effort. Other absences are for medical reasons / religious services and are infrequent or of short duration when for other reasons). o If current dressing causes regression in wound condition, may D/C ordered dressing product/s and apply Normal Saline Moist Dressing daily until next Wound Healing Center / Other MD appointment. Notify Wound Healing Center of regression in wound condition at [phone]. o Please direct any NON-WOUND related issues/requests for orders to patient's Primary Care Physician - Dr Vonita Moss Electronic Signature(s) Signed: 07/14/2017 4:50:14 PM By: Baltazar Najjar MD Signed: 07/14/2017 5:02:03 PM By: Curtis Sites Entered By: Curtis Sites on 07/14/2017 10:42:23 [name] ([id number]) -------------------------------------------------------------------------------- Problem List Details Patient Name: [name] Date of Service: 07/14/2017 10:15 AM Medical Record Patient Account Number: [id number] Number: Treating RN: Curtis Sites Mar 05, 1923 (83 y.o. Other Clinician: Date of Birth/Sex: Female) Treating Adaya Garmany Primary Care Provider: Vonita Moss Provider/Extender: G Referring Provider: Vonita Moss Weeks in Treatment: 26 Active Problems ICD-10 Encounter Code Description Active Date Diagnosis L89.153 Pressure ulcer of sacral region, stage 3 01/07/2017 Yes G20 Parkinson's disease 01/07/2017 Yes Inactive Problems Resolved Problems Electronic Signature(s) Signed: 07/14/2017 4:50:14 PM By: Baltazar Najjar MD Entered By: Baltazar Najjar on 07/14/2017 10:52:30 [name]  ([id number]) --------------------------------------------------------------------------------  Progress Note Details Patient Name: [name], [name] Date of Service: 07/14/2017 10:15 AM Medical Record Patient Account Number: [id number] Number: Treating RN: Curtis Sites 1922/10/01 (83 y.o. Other Clinician: Date of Birth/Sex: Female) Treating Jaymason Ledesma Primary Care Provider: Vonita Moss Provider/Extender: G Referring Provider: Vonita Moss Weeks in Treatment: 26 Subjective History of Present Illness (HPI) 01/07/17 this is a 83 year old woman admitted to the clinic today for review of a pressure ulcer on her lower sacrum. She is referred from her primary physician's office after being seen on 3/22 with a 3 cm pressure area. Her daughter and caretaker accompanied her today state that the area first became obvious about a month ago and his since deteriorated. They have recently got [name] a home health involved and have been using [name] to the wound. They have ordered a pressure relief surface for her mattress. They are turning her religiously. They state that she eats well and they've been forcing fluids on her. She is on a multivitamin. Looking through [facility] point last albumin I see was 4.4 on 10/28. The patient has advanced parkinsonism which looks superficially like advanced Parkinson's disease although her daughter tells me she did not ever respond to Sinemet therefore this may have another pathology with signs of parkinsonism. However I think this is largely a mute point currently. She also has dementia and is nonambulatory. Since this started they have been keeping her in bed and turning her religiously every 2 hours. She lives at home in [place] with her husband with 24/7 care giving 01/13/17 santyl change qd. Still will require further debridement. continue santyl. 01/20/17; patient's wound actually looks some better less adherent necrotic surface. There  is actually visible granulation. We're using [name] 01/27/17; better-looking surface but still a lot of necrotic tissue on the base of this wound. The periwound erythema is better than last week we are still using [name]. Her daughter tells [place] that she is still having trouble with the pressure-relief mattress through medical modalities 02/03/18; I had the patient scheduled for a two week followup however her daughter brought her in early concerned for discoloration on 2 areas of the wound circumference. We have bee using santyl 02/10/17;Better looking surface to the wound. Rim appears better suggesting better offloading. Using santyl 02/24/17; change to Silver Colle[place] last time. Wound appears better. 03/10/17; still using silver collagen religious offloading. Intake is satisfactory per her daughter. Dimension slightly better 03/12/2017 -- Dr. Jannetta Quint patient who had been seen 2 days ago and was doing fairly well. The patient is brought in by her daughter who noticed a new wound just above the previous wound on her sacral area and going on more to the left lateral side. She was very concerned and we asked her to get in for an opinion. 03/17/17; above is noted. The patient has developed a progressive area to the left of her original wound. This seems to this started with a ring of red skin with a more pale interior almost looking fungal. There was a rim of blister through part of the area although this did not look like zoster. They have been applying triamcinolone that was prescribed last week by Dr. Meyer Russel and the area has a fold to a linear band area which is confluent, erythematous and with obvious epidermal swelling but there is no overt tenderness or crepitus. She has lost some surface epithelium closer to the wound surface itself and now has a more superficial wound in this area and the extending erythema goes  towards the left buttock. This is well demarcated [name], [name]  ([id number]) between involved in normal skin but once again does not appear to be at all tender. If there is a contact issue here I cannot get the history out of the daughter or the caregiver that are with her. 03/24/17; the patient arrives today with the wound slightly worse slightly more drainage. The bandlike area of erythema that I treated as a possible pineal infection has improved somewhat although proximally is still has confluent erythema without overt tenderness. We have been using silver alginate since the most recent deterioration. To the bandlike degree of erythema we have been using Lotrisone cream 03/31/17; patient arrives today with the wound slightly larger, necrotic surface and surrounding erythema. The bandlike area of erythema that I treated as a possible pineal infection is less swollen but still present I've been using Lotrisone cream on that largely related to the presence of a tinea looking infection when this was first seen. We've been using silver alginate. X-ray that I ordered last week showed no acute bony abnormalities mild fecal impaction. Lab work showed a comprehensive metabolic panel that was normal including an albumin of 3.8. White count was 9.5 hemoglobin 12.3 differential count normal. C-reactive protein was less than 1 and sedimentation rate and 17. The latter 2 values does not support an ongoing bacterial infection. 04/14/17; patient arrives after a 2 week hiatus. Her wound is not in good condition. Although the base of the wound looks stable she still has an erythematous area that was apparently blistered over the weekend. This again points to the left. As our intake nurse pointed out today this is in the area where the tissue folds together and we may need to prevent try to prevent this. Lab work and x-ray that I did to 3 weeks ago were unremarkable including her albumin nevertheless she is an extremely frail condition physically. We have have been using  [name] 04/22/17; I changed her to silver alginate because of the surrounding maceration and moisture last week. The daughter did not like the way the wound looked in the middle of the week and [place] back [place]. They're putting gauze on top of this. Thinks still using Lotrisone. 05/06/17 on evaluation today patient sacral wound appears to be doing okay and does not seem to be any worse. She is having no significant pain during evaluation today the secondary to mental status she is unable to rate or describe whether she had any pain she was not however flinching. Her daughter states that the wound does appear to be looking better to her. Still we are having difficulty with the skinfold that seems to be closing in on itself at this point. All in all I feel like she is making some good progress in the [name] seems to be the official for her. They do tell me that a refill if we are gonna continue that today. No fevers, chills, nausea, or vomiting noted at this time. 05/13/17 presents today for evaluation concerning her ongoing sacral pressure ulcer. Unfortunately she also has an area of deep tissue injury in the right Ischial region which is starting to show up. The sacral wound also continues to show signs of necrotic tissue overlying and has declined. Overall we really have not seen a significant improvement in the past several months in regard to the sacral wound and now patient is starting to develop a new wound in the right Ischial region. Obviously this is not trending  in the direction that we want to see. No fevers, chills, nausea, or vomiting noted at this time. 05/19/17; I have not seen this wound and almost a month however there is nothing really positive to say about it. Necrotic tissue over the surface which superiorly I think abuts on her sacrum. She has surrounding erythema. I would be surprised if there is not underlying osteomyelitis or soft tissue infection. This is a very frail  woman with end-stage dementia. She apparently eats well per description although I wonder about this looking at her. Lab work I did probably 4 weeks ago however was really quite normal including a serum albumin 05/26/17; culture I did last week grew Escherichia coli and methicillin sensitive staph aureus which should've been well covered by the Augmentin and ciprofloxacin. Indeed the erythema around the wound in the bed of the wound looks somewhat better. Her intake is still satisfactory. They now have a near fluidized bed 06/02/17; they completed the antibiotics last Friday. Using collagen. Daughter still reports eating and drinking well. There is less visualized erythema around the wound. 06/16/17; large pressure ulcer over her lower sacrum and coccyx. Using [name] to the wound bed. 06/30/17; certainly no change in dimensions of this large stage III wound over her sacrum and coccyx. [name], [name] ([id number]) They've been using [name]. There is no exposed bone. She has a candidal/tinea area in the right inguinal area. Other than that her daughter relates that she is eating and drinking well there changing her positioning to make sure the areas offloaded 07/14/17; no major change in the dimensions of this large stage III wound. Initially a smaller wound that became secondarily infected causing significant tissue breakdown although it is been stable in the last several weeks. Tunneling superiorly at roughly 1:00 no change here either. There is no bone palpable. Both the patient's daughter and caretaker states that she eats well. I have not rechecked her blood work Objective Constitutional Sitting or standing Blood Pressure is within target range for patient.. Pulse regular and within target range for patient.Marland Kitchen Respirations regular, non-labored and within target range.. Temperature is normal and within the target range for the patient.. Vitals Time Taken: 10:20 AM, Temperature: 98.1 F,  Respiratory Rate: 16 breaths/min, Blood Pressure: 134/98 mmHg. Eyes Conjunctivae clear. No discharge. Respiratory Respiratory effort is easy and symmetric bilaterally. Rate is normal at rest and on room air.. Gastrointestinal (GI) Abdomen is soft and non-distended without masses or tenderness. Bowel sounds active in all quadrants.. Genitourinary (GU) Bladder without fullness, masses or tenderness.Marland Kitchen Psychiatric Nonverbal. General Notes: Wound exam; extensive wound over the lower sacrum and coccyx. There is only inferiorly at roughly 1 to 2:00 no palpable bone here. Most of the surface of this looks like reasonable granulation. I switched her to Silver collagen last week Integumentary (Hair, Skin) Wound #1 status is Open. Original cause of wound was Pressure Injury. The wound is located on the [place]. ([id number]) Midline Coccyx. The wound measures 5.7cm length x 5.5cm width x 1.5cm depth; 24.622cm^2 area and 36.933cm^3 volume. There is muscle and Fat Layer (Subcutaneous Tissue) Exposed exposed. There is no tunneling noted, however, there is undermining starting at 10:00 and ending at 2:00 with a maximum distance of 1.5cm. There is a large amount of purulent drainage noted. Foul odor after cleansing was noted. The wound margin is flat and intact. There is medium (34-66%) red granulation within the wound bed. There is a medium (34-66%) amount of necrotic tissue within the wound bed  including Eschar and Adherent Slough. The periwound skin appearance exhibited: Induration, Erythema. The periwound skin appearance did not exhibit: Callus, Crepitus, Excoriation, Rash, Scarring, Dry/Scaly, Maceration, Atrophie Blanche, Cyanosis, Ecchymosis, Hemosiderin Staining, Mottled, Pallor, Rubor. The surrounding wound skin color is noted with erythema which is circumferential. Periwound temperature was noted as No Abnormality. The periwound has tenderness on palpation. Assessment Active  Problems ICD-10 L89.153 - Pressure ulcer of sacral region, stage 3 G20 - Parkinson's disease Plan Wound Cleansing: Wound #1 Midline Coccyx: Clean wound with Normal Saline. May Shower, gently pat wound dry prior to applying new dressing. Anesthetic: Wound #1 Midline Coccyx: Topical Lidocaine 4% cream applied to wound bed prior to debridement - in clinic Skin Barriers/Peri-Wound Care: Wound #1 Midline Coccyx: Skin Prep Primary Wound Dressing: Wound #1 Midline Coccyx: Prisma Ag Secondary Dressing: Wound #1 Midline Coccyx: Saline moistened gauze - packed lightly into wound Boardered Foam Dressing - silicone bordered dressing due to skin breakdown Dressing Change Frequency: Wound #1 Midline Coccyx: Change dressing every day. [name], [name] ([id number]) Follow-up Appointments: Wound #1 Midline Coccyx: Return Appointment in 2 weeks. Off-Loading: Wound #1 Midline Coccyx: Roho cushion for wheelchair - HHRN please order roho cushion or gel cushion for patients wheelchair - whichever she qualifies for Turn and reposition every 2 hours Additional Orders / Instructions: Wound #1 Midline Coccyx: Increase protein intake. - please add protein supplements to patients diet Other: - please add vitamin A, vitamin C and zinc supplements to patients diet Home Health: Wound #1 Midline Coccyx: Continue [facility] Health Visits - [facility] Nurse may visit PRN to address patient s wound care needs. FACE TO FACE ENCOUNTER: MEDICARE and MEDICAID PATIENTS: I certify that this patient is under my care and that I had a face-to-face encounter that meets the physician face-to-face encounter requirements with this patient on this date. The encounter with the patient was in whole or in part for the following MEDICAL CONDITION: (primary reason for Home Healthcare) MEDICAL NECESSITY: I certify, that based on my findings, NURSING services are a medically necessary home health service. HOME BOUND  STATUS: I certify that my clinical findings support that this patient is homebound (i.e., Due to illness or injury, pt requires aid of supportive devices such as crutches, cane, wheelchairs, walkers, the use of special transportation or the assistance of another person to leave their place of residence. There is a normal inability to leave the home and doing so requires considerable and taxing effort. Other absences are for medical reasons / religious services and are infrequent or of short duration when for other reasons). If current dressing causes regression in wound condition, may D/C ordered dressing product/s and apply Normal Saline Moist Dressing daily until next Wound Healing Center / Other MD appointment. Notify Wound Healing Center of regression in wound condition at [phone]. Please direct any NON-WOUND related issues/requests for orders to patient's Primary Care Physician - Dr Vonita Moss #1 continue with Prisma with soiling moistened gauze packed into the wound and border foam dressing #2 the wound itself and the surrounding subcutaneous tissue just don't look amenable to a wound VAC #3 consider an advanced treatment option although this would have the disadvantage of needing to be left in place all week I am not sure if that possible an itself Electronic Signature(s) Signed: 07/14/2017 4:50:14 PM By: Baltazar Najjar MD Entered By: Baltazar Najjar on 07/14/2017 10:58:26 [name] ([id number]) Clelia Croft, Myles Gip ([id number]) -------------------------------------------------------------------------------- SuperBill Details Patient Name: [name] Date of Service: 07/14/2017  Medical Record Patient Account Number: [id number] Number: Treating RN: Curtis Sites [birth year] (83 y.o. Other Clinician: Date of Birth/Sex: Female) Treating Gwendolen Hewlett Primary Care Provider: Vonita Moss Provider/Extender: G Referring Provider: Vonita Moss Weeks in  Treatment: 26 Diagnosis Coding ICD-10 Codes Code Description L89.153 Pressure ulcer of sacral region, stage 3 G20 Parkinson's disease Facility Procedures CPT4 Code: [id number] Description: 99213 - WOUND CARE VISIT-LEV 3 EST PT Modifier: Quantity: 1 Physician Procedures CPT4 Code: [id number] Description: 99213 - WC PHYS LEVEL 3 - EST PT ICD-10 Description Diagnosis L89.153 Pressure ulcer of sacral region, stage 3 Modifier: Quantity: 1 Electronic Signature(s) Signed: 07/14/2017 4:50:14 PM By: Baltazar Najjar MD Entered By: Baltazar Najjar on 07/14/2017 10:58:46

## 2017-07-16 NOTE — Progress Notes (Signed)
Tina MainlandSHAW, Erika W. ([id number]) Visit Report for 07/14/2017 Arrival Information Details Patient Name: Tina MainlandSHAW, Jacalyn W. Date of Service: 07/14/2017 10:15 AM Medical Record Patient Account Number: [id number] Number: Treating RN: Curtis SitesDorthy, Joanna 1923/05/12 (83 y.o. Other Clinician: Date of Birth/Sex: Female) Treating ROBSON, MICHAEL Primary Care Aryn Safran: Vonita MossRISSMAN, MARK Tiernan Suto/Extender: G Referring Siyona Coto: Vonita MossRISSMAN, MARK Weeks in Treatment: 26 Visit Information History Since Last Visit Added or deleted any medications: No Patient Arrived: Wheel Chair Any new allergies or adverse No reactions: Arrival Time: 10:18 Had a fall or experienced change in No Accompanied By: dtr activities of daily living that may Transfer Assistance: Manual affect Patient Identification Verified: Yes risk of falls: Secondary Verification Process Yes Signs or symptoms of abuse/neglect No Completed: since last visito Patient Requires Transmission-Based No Hospitalized since last visit: No Precautions: Has Dressing in Place as Yes Patient Has Alerts: No Prescribed: Pain Present Now: Unable to Respond Electronic Signature(s) Signed: 07/14/2017 5:02:03 PM By: Curtis Sitesorthy, Joanna Entered By: Curtis Sitesorthy, Joanna on 07/14/2017 10:19:02 Tina MainlandSHAW, Nysia W. ([id number]) -------------------------------------------------------------------------------- Clinic Level of Care Assessment Details Patient Name: Tina MainlandSHAW, Sweta W. Date of Service: 07/14/2017 10:15 AM Medical Record Patient Account Number: [id number] Number: Treating RN: Curtis Sitesorthy, Joanna 1923/05/12 (83 y.o. Other Clinician: Date of Birth/Sex: Female) Treating ROBSON, MICHAEL Primary Care Nnenna Meador: Vonita MossRISSMAN, MARK Delroy Ordway/Extender: G Referring Eily Louvier: Vonita MossRISSMAN, MARK Weeks in Treatment: 26 Clinic Level of Care Assessment Items TOOL 4 Quantity Score []  - Use when only an EandM is performed on FOLLOW-UP visit 0 ASSESSMENTS - Nursing  Assessment / Reassessment X - Reassessment of Co-morbidities (includes updates in patient status) 1 10 X - Reassessment of Adherence to Treatment Plan 1 5 ASSESSMENTS - Wound and Skin Assessment / Reassessment X - Simple Wound Assessment / Reassessment - one wound 1 5 []  - Complex Wound Assessment / Reassessment - multiple wounds 0 []  - Dermatologic / Skin Assessment (not related to wound area) 0 ASSESSMENTS - Focused Assessment []  - Circumferential Edema Measurements - multi extremities 0 []  - Nutritional Assessment / Counseling / Intervention 0 []  - Lower Extremity Assessment (monofilament, tuning fork, pulses) 0 []  - Peripheral Arterial Disease Assessment (using hand held doppler) 0 ASSESSMENTS - Ostomy and/or Continence Assessment and Care X - Incontinence Assessment and Management 1 10 []  - Ostomy Care Assessment and Management (repouching, etc.) 0 PROCESS - Coordination of Care X - Simple Patient / Family Education for ongoing care 1 15 []  - Complex (extensive) Patient / Family Education for ongoing care 0 []  - Staff obtains ChiropractorConsents, Records, Test Results / Process Orders 0 []  - Staff telephones HHA, Nursing Homes / Clarify orders / etc 0 Tina MainlandSHAW, Lisanne W. ([id number]) []  - Routine Transfer to another Facility (non-emergent condition) 0 []  - Routine Hospital Admission (non-emergent condition) 0 []  - New Admissions / Manufacturing engineernsurance Authorizations / Ordering NPWT, Apligraf, etc. 0 []  - Emergency Hospital Admission (emergent condition) 0 X - Simple Discharge Coordination 1 10 []  - Complex (extensive) Discharge Coordination 0 PROCESS - Special Needs []  - Pediatric / Minor Patient Management 0 []  - Isolation Patient Management 0 []  - Hearing / Language / Visual special needs 0 []  - Assessment of Community assistance (transportation, D/C planning, etc.) 0 X - Additional assistance / Altered mentation 1 15 []  - Support Surface(s) Assessment (bed, cushion, seat, etc.) 0 INTERVENTIONS -  Wound Cleansing / Measurement X - Simple Wound Cleansing - one wound 1 5 []  - Complex Wound Cleansing - multiple wounds 0 X - Wound Imaging (photographs - any number  of wounds) 1 5 []  - Wound Tracing (instead of photographs) 0 X - Simple Wound Measurement - one wound 1 5 []  - Complex Wound Measurement - multiple wounds 0 INTERVENTIONS - Wound Dressings X - Small Wound Dressing one or multiple wounds 1 10 []  - Medium Wound Dressing one or multiple wounds 0 []  - Large Wound Dressing one or multiple wounds 0 []  - Application of Medications - topical 0 []  - Application of Medications - injection 0 MEHGAN, SANTMYER. ([id number]) INTERVENTIONS - Miscellaneous []  - External ear exam 0 []  - Specimen Collection (cultures, biopsies, blood, body fluids, etc.) 0 []  - Specimen(s) / Culture(s) sent or taken to Lab for analysis 0 X - Patient Transfer (multiple staff / Nurse, adult / Similar devices) 1 10 []  - Simple Staple / Suture removal (25 or less) 0 []  - Complex Staple / Suture removal (26 or more) 0 []  - Hypo / Hyperglycemic Management (close monitor of Blood Glucose) 0 []  - Ankle / Brachial Index (ABI) - do not check if billed separately 0 X - Vital Signs 1 5 Has the patient been seen at the hospital within the last three years: Yes Total Score: 110 Level Of Care: New/Established - Level 3 Electronic Signature(s) Signed: 07/14/2017 5:02:03 PM By: Curtis Sites Entered By: Curtis Sites on 07/14/2017 10:43:59 [name] ([id number]) -------------------------------------------------------------------------------- Complex / Palliative Patient Assessment Details Patient Name: [name] Date of Service: 07/14/2017 10:15 AM Medical Record Patient Account Number: [id number] Number: Treating RN: Curtis Sites 10-19-1922 (83 y.o. Other Clinician: Date of Birth/Sex: Female) Treating ROBSON, MICHAEL Primary Care Cintya Daughety: Vonita Moss Rheta Hemmelgarn/Extender: G Referring  Rojean Ige: Vonita Moss Weeks in Treatment: 26 Palliative Management Criteria Complex Wound Management Criteria The patient has limited personal or cognitive resources, or has no access to appropriate ongoing care providers, such that it is unreasonable to expect a level of compliance with prescribed advanced wound care treatments necessary to achieve desired healing outcomes. (Must be documented in physician progress notes.) Care Approach Wound Care Plan: Complex Wound Management Electronic Signature(s) Signed: 07/14/2017 1:18:24 PM By: Curtis Sites Signed: 07/14/2017 4:50:14 PM By: Baltazar Najjar MD Entered By: Curtis Sites on 07/14/2017 13:18:24 [name] ([id number]) -------------------------------------------------------------------------------- Encounter Discharge Information Details Patient Name: [name] Date of Service: 07/14/2017 10:15 AM Medical Record Patient Account Number: [id number] Number: Treating RN: Curtis Sites 1923-07-22 (83 y.o. Other Clinician: Date of Birth/Sex: Female) Treating ROBSON, MICHAEL Primary Care Javiel Canepa: Vonita Moss Marian Grandt/Extender: G Referring Geraldean Walen: Vonita Moss Weeks in Treatment: 46 Encounter Discharge Information Items Discharge Pain Level: 0 Discharge Condition: Stable Ambulatory Status: Wheelchair Discharge Destination: Home Transportation: Private Auto Accompanied By: dtr and cg Schedule Follow-up Appointment: Yes Medication Reconciliation completed and provided to Patient/Care No Taaliyah Delpriore: Provided on Clinical Summary of Care: 07/14/2017 Form Type Recipient Paper Patient ES Electronic Signature(s) Signed: 07/15/2017 3:48:01 PM By: Gwenlyn Perking Entered By: Gwenlyn Perking on 07/14/2017 10:58:55 [name] ([id number]) -------------------------------------------------------------------------------- Multi Wound Chart Details Patient Name: [name] Date of Service:  07/14/2017 10:15 AM Medical Record Patient Account Number: [id number] Number: Treating RN: Curtis Sites 1923-06-25 (83 y.o. Other Clinician: Date of Birth/Sex: Female) Treating ROBSON, MICHAEL Primary Care Parks Czajkowski: Vonita Moss Kierston Plasencia/Extender: G Referring Rayshard Schirtzinger: Vonita Moss Weeks in Treatment: 26 Vital Signs Height(in): Pulse(bpm): Weight(lbs): Blood Pressure 134/98 (mmHg): Body Mass Index(BMI): Temperature(F): 98.1 Respiratory Rate 16 (breaths/min): Photos: [1:No Photos] [N/A:N/A] Wound Location: [1:Coccyx - Midline] [N/A:N/A] Wounding Event: [1:Pressure Injury] [N/A:N/A] Primary Etiology: [1:Pressure Ulcer] [N/A:N/A]  Comorbid History: [1:Anemia, Hypertension, Dementia] [N/A:N/A] Date Acquired: [1:12/01/2016] [N/A:N/A] Weeks of Treatment: [1:26] [N/A:N/A] Wound Status: [1:Open] [N/A:N/A] Measurements L x W x D 5.7x5.5x1.5 [N/A:N/A] (cm) Area (cm) : [1:24.622] [N/A:N/A] Volume (cm) : [1:36.933] [N/A:N/A] % Reduction in Area: [1:-492.60%] [N/A:N/A] % Reduction in Volume: -2122.20% [N/A:N/A] Starting Position 1 10 (o'clock): Ending Position 1 [1:2] (o'clock): Maximum Distance 1 1.5 (cm): Undermining: [1:Yes] [N/A:N/A] Classification: [1:Category/Stage IV] [N/A:N/A] Exudate Amount: [1:Large] [N/A:N/A] Exudate Type: [1:Purulent] [N/A:N/A] Exudate Color: [1:yellow, brown, green] [N/A:N/A] Foul Odor After [1:Yes] [N/A:N/A] Cleansing: LUVA, METZGER ([id number]) Odor Anticipated Due to No N/A N/A Product Use: Wound Margin: Flat and Intact N/A N/A Granulation Amount: Medium (34-66%) N/A N/A Granulation Quality: Red N/A N/A Necrotic Amount: Medium (34-66%) N/A N/A Necrotic Tissue: Eschar, Adherent Slough N/A N/A Exposed Structures: Fat Layer (Subcutaneous N/A N/A Tissue) Exposed: Yes Muscle: Yes Fascia: No Tendon: No Joint: No Bone: No Epithelialization: None N/A N/A Periwound Skin Texture: Induration: Yes N/A N/A Excoriation:  No Callus: No Crepitus: No Rash: No Scarring: No Periwound Skin Maceration: No N/A N/A Moisture: Dry/Scaly: No Periwound Skin Color: Erythema: Yes N/A N/A Atrophie Blanche: No Cyanosis: No Ecchymosis: No Hemosiderin Staining: No Mottled: No Pallor: No Rubor: No Erythema Location: Circumferential N/A N/A Temperature: No Abnormality N/A N/A Tenderness on Yes N/A N/A Palpation: Wound Preparation: Ulcer Cleansing: N/A N/A Rinsed/Irrigated with Saline Topical Anesthetic Applied: Other: lidocaine 4% Treatment Notes Wound #1 (Midline Coccyx) 1. Cleansed with: Clean wound with Normal Saline 2. Anesthetic Topical Lidocaine 4% cream to wound bed prior to debridement DELSA, WALDER. ([id number]) 4. Dressing Applied: Prisma Ag 5. Secondary Dressing Applied Bordered Foam Dressing Saline moistened guaze Electronic Signature(s) Signed: 07/14/2017 4:50:14 PM By: Baltazar Najjar MD Entered By: Baltazar Najjar on 07/14/2017 10:52:41 [name] ([id number]) -------------------------------------------------------------------------------- Multi-Disciplinary Care Plan Details Patient Name: [name] Date of Service: 07/14/2017 10:15 AM Medical Record Patient Account Number: [id number] Number: Treating RN: Curtis Sites 04/03/1923 (83 y.o. Other Clinician: Date of Birth/Sex: Female) Treating ROBSON, MICHAEL Primary Care Natlie Asfour: Vonita Moss Martine Trageser/Extender: G Referring Maelin Kurkowski: Vonita Moss Weeks in Treatment: 26 Active Inactive ` Abuse / Safety / Falls / Self Care Management Nursing Diagnoses: Impaired physical mobility Potential for falls Goals: Patient will remain injury free Date Initiated: 01/07/2017 Target Resolution Date: 04/03/2017 Goal Status: Active Interventions: Assess fall risk on admission and as needed Notes: ` Nutrition Nursing Diagnoses: Potential for alteratiion in Nutrition/Potential for imbalanced  nutrition Goals: Patient/caregiver agrees to and verbalizes understanding of need to use nutritional supplements and/or vitamins as prescribed Date Initiated: 01/07/2017 Target Resolution Date: 04/03/2017 Goal Status: Active Interventions: Assess patient nutrition upon admission and as needed per policy Notes: ` Orientation to the Wound Care Program ZYLPHA, POYNOR ([id number]) Nursing Diagnoses: Knowledge deficit related to the wound healing center program Goals: Patient/caregiver will verbalize understanding of the Wound Healing Center Program Date Initiated: 01/07/2017 Target Resolution Date: 04/03/2017 Goal Status: Active Interventions: Provide education on orientation to the wound center Notes: ` Pressure Nursing Diagnoses: Knowledge deficit related to causes and risk factors for pressure ulcer development Goals: Patient will remain free from development of additional pressure ulcers Date Initiated: 01/07/2017 Target Resolution Date: 04/03/2017 Goal Status: Active Interventions: Assess potential for pressure ulcer upon admission and as needed Notes: ` Wound/Skin Impairment Nursing Diagnoses: Impaired tissue integrity Goals: Patient/caregiver will verbalize understanding of skin care regimen Date Initiated: 01/07/2017 Target Resolution Date: 04/03/2017 Goal Status: Active Ulcer/skin breakdown will have a volume reduction of 30% by week  4 Date Initiated: 01/07/2017 Target Resolution Date: 04/03/2017 Goal Status: Active Ulcer/skin breakdown will have a volume reduction of 50% by week 8 Date Initiated: 01/07/2017 Target Resolution Date: 04/03/2017 Goal Status: Active Ulcer/skin breakdown will have a volume reduction of 80% by week 12 YURIANA, GAAL ([id number]) Date Initiated: 01/07/2017 Target Resolution Date: 04/03/2017 Goal Status: Active Ulcer/skin breakdown will heal within 14 weeks Date Initiated: 01/07/2017 Target Resolution Date: 04/03/2017 Goal Status:  Active Interventions: Assess patient/caregiver ability to obtain necessary supplies Assess patient/caregiver ability to perform ulcer/skin care regimen upon admission and as needed Assess ulceration(s) every visit Notes: Electronic Signature(s) Signed: 07/14/2017 5:02:03 PM By: Curtis Sites Entered By: Curtis Sites on 07/14/2017 10:38:31 [name] ([id number]) -------------------------------------------------------------------------------- Pain Assessment Details Patient Name: [name] Date of Service: 07/14/2017 10:15 AM Medical Record Patient Account Number: [id number] Number: Treating RN: Curtis Sites [birth year] (83 y.o. Other Clinician: Date of Birth/Sex: Female) Treating ROBSON, MICHAEL Primary Care Nacole Fluhr: Vonita Moss Lutisha Knoche/Extender: G Referring Ingeborg Fite: Vonita Moss Weeks in Treatment: 26 Active Problems Location of Pain Severity and Description of Pain Patient Has Paino Patient Unable to Respond Site Locations Pain Management and Medication Current Pain Management: Electronic Signature(s) Signed: 07/14/2017 5:02:03 PM By: Curtis Sites Entered By: Curtis Sites on 07/14/2017 10:20:28 [name] ([id number]) -------------------------------------------------------------------------------- Patient/Caregiver Education Details Patient Name: [name] Date of Service: 07/14/2017 10:15 AM Medical Record Patient Account Number: [id number] Number: Treating RN: Curtis Sites [birth year] (83 y.o. Other Clinician: Date of Birth/Gender: Female) Treating ROBSON, MICHAEL Primary Care Physician: Vonita Moss Physician/Extender: G Referring Physician: Vonita Moss Weeks in Treatment: 34 Education Assessment Education Provided To: Caregiver Education Topics Provided Nutrition: Handouts: Nutrition Methods: Explain/Verbal Responses: State content correctly Electronic Signature(s) Signed: 07/14/2017  5:02:03 PM By: Curtis Sites Entered By: Curtis Sites on 07/14/2017 10:45:11 [name] ([id number]) -------------------------------------------------------------------------------- Wound Assessment Details Patient Name: [name] Date of Service: 07/14/2017 10:15 AM Medical Record Patient Account Number: [id number] Number: Treating RN: Curtis Sites [birth year] (83 y.o. Other Clinician: Date of Birth/Sex: Female) Treating ROBSON, MICHAEL Primary Care Kelci Petrella: Vonita Moss Jazalynn Mireles/Extender: G Referring Zanaya Baize: Vonita Moss Weeks in Treatment: 26 Wound Status Wound Number: 1 Primary Etiology: Pressure Ulcer Wound Location: Coccyx - Midline Wound Status: Open Wounding Event: Pressure Injury Comorbid History: Anemia, Hypertension, Dementia Date Acquired: 12/01/2016 Weeks Of Treatment: 26 Clustered Wound: No Photos Photo Uploaded By: Curtis Sites on 07/14/2017 14:16:09 Wound Measurements Length: (cm) 5.7 % Reduction in A Width: (cm) 5.5 % Reduction in V Depth: (cm) 1.5 Epithelializatio Area: (cm) 24.622 Tunneling: Volume: (cm) 36.933 Undermining: Starting Posi Ending Positi Maximum Dista rea: -492.6% olume: -2122.2% n: None No Yes tion (o'clock): 10 on (o'clock): 2 nce: (cm) 1.5 Wound Description Classification: Category/Stage IV Foul Odor After Wound Margin: Flat and Intact Due to Product Exudate Amount: Large Slough/Fibrino Exudate Type: Purulent Exudate Color: yellow, brown, green ARABIA, NYLUND. ([id number]) Cleansing: Yes Use: No Yes Wound Bed Granulation Amount: Medium (34-66%) Exposed Structure Granulation Quality: Red Fascia Exposed: No Necrotic Amount: Medium (34-66%) Fat Layer (Subcutaneous Tissue) Exposed: Yes Necrotic Quality: Eschar, Adherent Slough Tendon Exposed: No Muscle Exposed: Yes Necrosis of Muscle: No Joint Exposed: No Bone Exposed: No Periwound Skin Texture Texture Color No Abnormalities  Noted: No No Abnormalities Noted: No Callus: No Atrophie Blanche: No Crepitus: No Cyanosis: No Excoriation: No Ecchymosis: No Induration: Yes Erythema: Yes Rash: No Erythema Location: Circumferential Scarring: No Hemosiderin Staining: No Mottled: No Moisture Pallor: No No Abnormalities Noted: No Rubor: No  Dry / Scaly: No Maceration: No Temperature / Pain Temperature: No Abnormality Tenderness on Palpation: Yes Wound Preparation Ulcer Cleansing: Rinsed/Irrigated with Saline Topical Anesthetic Applied: Other: lidocaine 4%, Treatment Notes Wound #1 (Midline Coccyx) 1. Cleansed with: Clean wound with Normal Saline 2. Anesthetic Topical Lidocaine 4% cream to wound bed prior to debridement 4. Dressing Applied: Prisma Ag 5. Secondary Dressing Applied Bordered Foam Dressing Saline moistened guaze Electronic Signature(s) Signed: 07/14/2017 5:02:03 PM By: Curtis Sites Entered By: Curtis Sites on 07/14/2017 10:30:33 [name] ([id number]) [name] ([id number]) -------------------------------------------------------------------------------- Vitals Details Patient Name: [name] Date of Service: 07/14/2017 10:15 AM Medical Record Patient Account Number: [id number] Number: Treating RN: Curtis Sites [birth year] (83 y.o. Other Clinician: Date of Birth/Sex: Female) Treating ROBSON, MICHAEL Primary Care Zhyon Antenucci: Vonita Moss Alejandra Barna/Extender: G Referring Kerrington Sova: Vonita Moss Weeks in Treatment: 26 Vital Signs Time Taken: 10:20 Temperature (F): 98.1 Respiratory Rate (breaths/min): 16 Blood Pressure (mmHg): 134/98 Reference Range: 80 - 120 mg / dl Electronic Signature(s) Signed: 07/14/2017 5:02:03 PM By: Curtis Sites Entered By: Curtis Sites on 07/14/2017 10:21:48

## 2017-07-21 ENCOUNTER — Encounter: Payer: Medicare Other | Admitting: Internal Medicine

## 2017-07-28 ENCOUNTER — Other Ambulatory Visit
Admission: RE | Admit: 2017-07-28 | Discharge: 2017-07-28 | Disposition: A | Discharge status: Home / Self Care | Payer: Medicare Other | Source: Ambulatory Visit | Attending: Internal Medicine | Admitting: Internal Medicine

## 2017-07-28 ENCOUNTER — Encounter: Payer: Medicare Other | Admitting: Internal Medicine

## 2017-07-28 ENCOUNTER — Telehealth: Payer: Self-pay | Admitting: Family Medicine

## 2017-07-28 DIAGNOSIS — X58XXXA Exposure to other specified factors, initial encounter: Secondary | ICD-10-CM | POA: Insufficient documentation

## 2017-07-28 DIAGNOSIS — S31809A Unspecified open wound of unspecified buttock, initial encounter: Secondary | ICD-10-CM | POA: Diagnosis present

## 2017-07-28 DIAGNOSIS — L89153 Pressure ulcer of sacral region, stage 3: Secondary | ICD-10-CM | POA: Diagnosis not present

## 2017-07-28 DIAGNOSIS — Z1611 Resistance to penicillins: Secondary | ICD-10-CM | POA: Diagnosis not present

## 2017-07-28 DIAGNOSIS — A4901 Methicillin susceptible Staphylococcus aureus infection, unspecified site: Secondary | ICD-10-CM | POA: Insufficient documentation

## 2017-07-28 NOTE — Telephone Encounter (Signed)
No need to bring patient in we will call.

## 2017-07-28 NOTE — Telephone Encounter (Signed)
  Patient is taking an antibiotic for recent bed pressure sore. Patient has recently stopped wanting to eat. Pt. Has been improving on drinking but not eating. Hoping pt. Improves on overall health. Daughter would like to make sure bringing pt. In for appt. is okay.  Pt. Scheduled for 07/29/2017 at 2:30 pm.  Please Advise.  Thank you

## 2017-07-28 NOTE — Telephone Encounter (Signed)
Please see message from staff. Pt scheduled for tomorrow. Family would like opinion on if they should keep patient at home or bring her in to be seen.

## 2017-07-29 ENCOUNTER — Ambulatory Visit: Payer: Medicare Other | Admitting: Family Medicine

## 2017-07-29 NOTE — Telephone Encounter (Addendum)
Spoke with daughter, relayed message. Daughter would like to know if they can get labs drawn to see check liver and kidney function to see if "it's the end" or if function is improving. Pt receiving home health for wound care. [facility] and they will draw labs at home for patient. Need to order and fax to them. Please advise.

## 2017-07-29 NOTE — Telephone Encounter (Signed)
Orders faxed to [facility] [address]. Phone: [phone] Fax: [phone]

## 2017-07-29 NOTE — Telephone Encounter (Signed)
Labs ordered.

## 2017-07-29 NOTE — Progress Notes (Addendum)
DUSTINE, BERTINI ([id number]) Visit Report for 07/28/2017 HPI Details Patient Name: [name], [name] Date of Service: 07/28/2017 10:15 AM Medical Record Number: [id number] Patient Account Number: [id number] Date of Birth/Sex: 04/25/1923 (83 y.o. Female) Treating RN: Curtis Sites Primary Care Provider: Vonita Moss Other Clinician: Referring Provider: Vonita Moss Treating Provider/Extender: Altamese [place] in Treatment: 28 History of Present Illness HPI Description: 01/07/17 this is a 83 year old woman admitted to the clinic today for review of a pressure ulcer on her lower sacrum. She is referred from her primary physician's office after being seen on 3/22 with a 3 cm pressure area. Her daughter and caretaker accompanied her today state that the area first became obvious about a month ago and his since deteriorated. They have recently got [name] a home health involved and have been using [name] to the wound. They have ordered a pressure relief surface for her mattress. They are turning her religiously. They state that she eats well and they've been forcing fluids on her. She is on a multivitamin. Looking through [facility] point last albumin I see was 4.4 on 10/28. The patient has advanced parkinsonism which looks superficially like advanced Parkinson's disease although her daughter tells me she did not ever respond to Sinemet therefore this may have another pathology with signs of parkinsonism. However I think this is largely a mute point currently. She also has dementia and is nonambulatory. Since this started they have been keeping her in bed and turning her religiously every 2 hours. She lives at home in [place] with her husband with 24/7 care giving 01/13/17 santyl change qd. Still will require further debridement. continue santyl. 01/20/17; patient's wound actually looks some better less adherent necrotic surface. There is actually visible granulation. We're using  [name] 01/27/17; better-looking surface but still a lot of necrotic tissue on the base of this wound. The periwound erythema is better than last week we are still using [name]. Her daughter tells [place] that she is still having trouble with the pressure-relief mattress through medical modalities 02/03/18; I had the patient scheduled for a two week followup however her daughter brought her in early concerned for discoloration on 2 areas of the wound circumference. We have bee using santyl 02/10/17;Better looking surface to the wound. Rim appears better suggesting better offloading. Using santyl 02/24/17; change to Silver Collegen last time. Wound appears better. 03/10/17; still using silver collagen religious offloading. Intake is satisfactory per her daughter. Dimension slightly better 03/12/2017 -- Dr. Jannetta Quint patient who had been seen 2 days ago and was doing fairly well. The patient is brought in by her daughter who noticed a new wound just above the previous wound on her sacral area and going on more to the left lateral side. She was very concerned and we asked her to get in for an opinion. 03/17/17; above is noted. The patient has developed a progressive area to the left of her original wound. This seems to this started with a ring of red skin with a more pale interior almost looking fungal. There was a rim of blister through part of the area although this did not look like zoster. They have been applying triamcinolone that was prescribed last week by Dr. Meyer Russel and the area has a fold to a linear band area which is confluent, erythematous and with obvious epidermal swelling but there is no overt tenderness or crepitus. She has lost some surface epithelium closer to the wound surface itself and now has a more superficial wound in  this area and the extending erythema goes towards the left buttock. This is well demarcated between involved in normal skin but once again does not appear to be at all tender.  If there is a contact issue here I cannot get the history out of the daughter or the caregiver that are with her. 03/24/17; the patient arrives today with the wound slightly worse slightly more drainage. The bandlike area of erythema that I treated as a possible pineal infection has improved somewhat although proximally is still has confluent erythema without overt tenderness. We have been using silver alginate since the most recent deterioration. To the bandlike degree of erythema we have been using Lotrisone cream 03/31/17; patient arrives today with the wound slightly larger, necrotic surface and surrounding erythema. The bandlike area of erythema that I treated as a possible pineal infection is less swollen but still present I've been using Lotrisone cream on that largely related to the presence of a tinea looking infection when this was first seen. We've been using silver alginate. X-ray that I ordered last week showed no acute bony abnormalities mild fecal impaction. Lab work showed a comprehensive metabolic panel that was normal including an albumin of 3.8. White count was 9.5 hemoglobin 12.3 differential count normal. Graven, Delynda W. ([id number]) C-reactive protein was less than 1 and sedimentation rate and 17. The latter 2 values does not support an ongoing bacterial infection. 04/14/17; patient arrives after a 2 week hiatus. Her wound is not in good condition. Although the base of the wound looks stable she still has an erythematous area that was apparently blistered over the weekend. This again points to the left. As our intake nurse pointed out today this is in the area where the tissue folds together and we may need to prevent try to prevent this. Lab work and x-ray that I did to 3 weeks ago were unremarkable including her albumin nevertheless she is an extremely frail condition physically. We have have been using [name] 04/22/17; I changed her to silver alginate because of the  surrounding maceration and moisture last week. The daughter did not like the way the wound looked in the middle of the week and changed her back to [place]. They're putting gauze on top of this. Thinks still using Lotrisone. 05/06/17 on evaluation today patient sacral wound appears to be doing okay and does not seem to be any worse. She is having no significant pain during evaluation today the secondary to mental status she is unable to rate or describe whether she had any pain she was not however flinching. Her daughter states that the wound does appear to be looking better to her. Still we are having difficulty with the skinfold that seems to be closing in on itself at this point. All in all I feel like she is making some good progress in the [name] seems to be the official for her. They do tell me that a refill if we are gonna continue that today. No fevers, chills, nausea, or vomiting noted at this time. 05/13/17 presents today for evaluation concerning her ongoing sacral pressure ulcer. Unfortunately she also has an area of deep tissue injury in the right Ischial region which is starting to show up. The sacral wound also continues to show signs of necrotic tissue overlying and has declined. Overall we really have not seen a significant improvement in the past several months in regard to the sacral wound and now patient is starting to develop a new wound in the right  Ischial region. Obviously this is not trending in the direction that we want to see. No fevers, chills, nausea, or vomiting noted at this time. 05/19/17; I have not seen this wound and almost a month however there is nothing really positive to say about it. Necrotic tissue over the surface which superiorly I think abuts on her sacrum. She has surrounding erythema. I would be surprised if there is not underlying osteomyelitis or soft tissue infection. 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 She apparently eats well per  description although I wonder about this looking at her. Lab work I did probably 4 weeks ago however was really quite normal including a serum albumin 05/26/17; culture I did last week grew Escherichia coli and methicillin sensitive staph aureus which should've been well covered by the Augmentin and ciprofloxacin. Indeed the erythema around the wound in the bed of the wound looks somewhat better. Her intake is still satisfactory. They now have a near fluidized bed 06/02/17; they completed the antibiotics last Friday. Using collagen. Daughter still reports eating and drinking well. There is less visualized erythema around the wound. 06/16/17; large pressure ulcer over her lower sacrum and coccyx. Using [name] to the wound bed. 06/30/17; certainly no change in dimensions of this large stage III wound over her sacrum and coccyx. They've been using [name]. There is no exposed bone. She has a candidal/tinea area in the right inguinal area. Other than that her daughter relates that she is eating and drinking well there changing her positioning to make sure the areas offloaded 07/14/17; no major change in the dimensions of this large stage III wound. Initially a smaller wound that became secondarily infected causing significant tissue breakdown although it is been stable in the last several weeks. Tunneling superiorly at roughly 1:00 no change here either. There is no bone palpable. Both the patient's daughter and caretaker states that she eats well. I have not rechecked her blood work 07/27/17; patient arrives in clinic today and generally a deteriorated looking state. Mild fever with axillary temperature of 100.4. Daughter reports she has not been eating and drinking well since yesterday. She looks more pale and thin and less responsive. We have been using silver collagen to her wound Electronic Signature(s) Signed: 07/28/2017 4:48:36 PM By: Baltazar Najjar MD Entered By: Baltazar Najjar on 07/28/2017  10:51:27 [name] ([id number]) -------------------------------------------------------------------------------- Physical Exam Details Patient Name: [name] Date of Service: 07/28/2017 10:15 AM Medical Record Number: [id number] Patient Account Number: [id number] Date of Birth/Sex: 08/15/1923 (83 y.o. Female) Treating RN: Curtis Sites Primary Care Provider: Vonita Moss Other Clinician: Referring Provider: Vonita Moss Treating Provider/Extender: Altamese [place] in Treatment: 28 Constitutional Sitting or standing Blood Pressure is within target range for patient.. Pulse regular and within target range for patient.Marland Kitchen Respirations regular, non-labored and within target range. O2 sat is 96% on room air. Patient is febrile today.. The patient looks very frail, pale and even less responsive than per usual. Eyes No scleral icterus. Ears, Nose, Mouth, and Throat Patient would not open her mouth. Neck No masses. Respiratory Respiratory effort is easy and symmetric bilaterally. Rate is normal at rest and on room air.. Few crackles in the right lower lobe although her pulse ox was 96%. Cardiovascular Heart sounds are normal. Not grossly dehydrated. Gastrointestinal (GI) No masses are palpable. No liver or spleen enlargement or tenderness.. Genitourinary (GU) No clear bladder distention. Psychiatric The patient is nonverbal which is not new for her although less responsive.Marland Kitchen  Notes Wound exam; her extensive wound over the lower sacrum and coccyx is roughly the same. Had some purulent looking drainage coming from the right superior side of the wound at roughly 1 to 2:00 which I have cultured. There is duskiness around the wound but I can't really be certain that this is soft tissue infection. To the left there is a bandlike area perhaps another pressure area but this also could be related to underlying soft tissue infection Electronic Signature(s) Signed:  07/28/2017 4:48:36 PM By: Baltazar Najjar MD Entered By: Baltazar Najjar on 07/28/2017 10:58:32 [name] ([id number]) -------------------------------------------------------------------------------- Physician Orders Details Patient Name: [name] Date of Service: 07/28/2017 10:15 AM Medical Record Number: [id number] Patient Account Number: [id number] Date of Birth/Sex: [birth year] (83 y.o. Female) Treating RN: Curtis Sites Primary Care Provider: Vonita Moss Other Clinician: Referring Provider: Vonita Moss Treating Provider/Extender: Altamese [place] in Treatment: 25 Verbal / Phone Orders: No Diagnosis Coding Wound Cleansing Wound #1 Midline Coccyx o Clean wound with Normal Saline. o May Shower, gently pat wound dry prior to applying new dressing. Wound #3 Left Gluteus o Clean wound with Normal Saline. o May Shower, gently pat wound dry prior to applying new dressing. Anesthetic Wound #1 Midline Coccyx o Topical Lidocaine 4% cream applied to wound bed prior to debridement - in clinic Wound #3 Left Gluteus o Topical Lidocaine 4% cream applied to wound bed prior to debridement - in clinic Skin Barriers/Peri-Wound Care Wound #1 Midline Coccyx o Skin Prep Wound #3 Left Gluteus o Skin Prep Primary Wound Dressing Wound #1 Midline Coccyx o Prisma Ag Wound #3 Left Gluteus o Prisma Ag Secondary Dressing Wound #1 Midline Coccyx o Saline moistened gauze - packed lightly into wound o Boardered Foam Dressing - silicone bordered dressing due to skin breakdown Wound #3 Left Gluteus o Boardered Foam Dressing Dressing Change Frequency Wound #1 Midline Coccyx o Change dressing every day. Wound #3 Left Gluteus KAMERA, DUBAS. ([id number]) o Change dressing every day. Follow-up Appointments Wound #1 Midline Coccyx o Return Appointment in 2 weeks. Wound #3 Left Gluteus o Return Appointment in 2 weeks. Off-Loading Wound  #1 Midline Coccyx o Roho cushion for wheelchair - [facility] please order roho cushion or gel cushion for patients wheelchair - whichever she qualifies for o Turn and reposition every 2 hours Wound #3 Left Gluteus o Roho cushion for wheelchair - [facility] please order roho cushion or gel cushion for patients wheelchair - whichever she qualifies for o Turn and reposition every 2 hours Additional Orders / Instructions Wound #1 Midline Coccyx o Increase protein intake. - please add protein supplements to patients diet o Other: - please add vitamin A, vitamin C and zinc supplements to patients diet Wound #3 Left Gluteus o Increase protein intake. - please add protein supplements to patients diet o Other: - please add vitamin A, vitamin C and zinc supplements to patients diet Home Health Wound #1 Midline Coccyx o Continue Home Health Visits - [facility] o Home Health Nurse may visit PRN to address patientos wound care needs. o FACE TO FACE ENCOUNTER: MEDICARE and MEDICAID PATIENTS: I certify that this patient is under my care and that I had a face-to-face encounter that meets the physician face-to-face encounter requirements with this patient on this date. The encounter with the patient was in whole or in part for the following MEDICAL CONDITION: (primary reason for Home Healthcare) MEDICAL NECESSITY: I certify, that based on my findings, NURSING services are a medically necessary home health  service. HOME BOUND STATUS: I certify that my clinical findings support that this patient is homebound (i.e., Due to illness or injury, pt requires aid of supportive devices such as crutches, cane, wheelchairs, walkers, the use of special transportation or the assistance of another person to leave their place of residence. There is a normal inability to leave the home and doing so requires considerable and taxing effort. Other absences are for medical reasons / religious services and are  infrequent or of short duration when for other reasons). o If current dressing causes regression in wound condition, may D/C ordered dressing product/s and apply Normal Saline Moist Dressing daily until next Wound Healing Center / Other MD appointment. Notify Wound Healing Center of regression in wound condition at [phone]. o Please direct any NON-WOUND related issues/requests for orders to patient's Primary Care Physician - Dr Vonita Moss Wound #3 Left Gluteus o Continue Home Health Visits - [facility] o Home Health Nurse may visit PRN to address patientos wound care needs. o FACE TO FACE ENCOUNTER: MEDICARE and MEDICAID PATIENTS: I certify that this patient is under my care and that I had a face-to-face encounter that meets the physician face-to-face encounter requirements with this patient on this date. The encounter with the patient was in whole or in part for the following MEDICAL CONDITION: (primary reason for Home Healthcare) MEDICAL NECESSITY: I certify, that based on my findings, CHRISTINE, MORTON ([id number]) NURSING services are a medically necessary home health service. HOME BOUND STATUS: I certify that my clinical findings support that this patient is homebound (i.e., Due to illness or injury, pt requires aid of supportive devices such as crutches, cane, wheelchairs, walkers, the use of special transportation or the assistance of another person to leave their place of residence. There is a normal inability to leave the home and doing so requires considerable and taxing effort. Other absences are for medical reasons / religious services and are infrequent or of short duration when for other reasons). o If current dressing causes regression in wound condition, may D/C ordered dressing product/s and apply Normal Saline Moist Dressing daily until next Wound Healing Center / Other MD appointment. Notify Wound Healing Center of regression in wound condition at  [phone]. o Please direct any NON-WOUND related issues/requests for orders to patient's Primary Care Physician - Dr Vonita Moss Laboratory o Bacteria identified in Wound by Culture (MICRO) oooo LOINC Code: [phone] oooo Convenience Name: Wound culture routine Patient Medications Allergies: Phenergan, Sulfa (Sulfonamide Antibiotics) Notifications Medication Indication Start End cephalexin possilbe wound 07/28/2017 infection DOSE oral 250 mg/5 mL suspension for reconstitution - suspension for reconstitution oral. 10 ml tid for 7 days (500mg  tid) Electronic Signature(s) Signed: 08/09/2017 4:06:52 PM By: Baltazar Najjar MD Signed: 08/31/2017 12:38:17 PM By: Curtis Sites Previous Signature: 07/28/2017 11:03:31 AM Version By: Baltazar Najjar MD Entered By: Curtis Sites on 08/05/2017 09:07:10 [name] ([id number]) -------------------------------------------------------------------------------- Problem List Details Patient Name: [name] Date of Service: 07/28/2017 10:15 AM Medical Record Number: [id number] Patient Account Number: [id number] Date of Birth/Sex: [birth year] (83 y.o. Female) Treating RN: Curtis Sites Primary Care Provider: Vonita Moss Other Clinician: Referring Provider: Vonita Moss Treating Provider/Extender: Altamese [place] in Treatment: 28 Active Problems ICD-10 Encounter Code Description Active Date Diagnosis L89.153 Pressure ulcer of sacral region, stage 3 01/07/2017 Yes G20 Parkinson's disease 01/07/2017 Yes L03.312 Cellulitis of back [any part except buttock] 07/28/2017 Yes Inactive Problems Resolved Problems Electronic Signature(s) Signed: 07/28/2017 4:48:36 PM By: Baltazar Najjar MD Entered By: Leanord Hawking,  Michael on 07/28/2017 10:50:09 JINNIE, ONLEY ([id number]) -------------------------------------------------------------------------------- Progress Note Details Patient Name: KATHREN, SCEARCE Date of Service:  07/28/2017 10:15 AM Medical Record Number: [id number] Patient Account Number: [id number] Date of Birth/Sex: 08-Oct-1922 (83 y.o. Female) Treating RN: Curtis Sites Primary Care Provider: Vonita Moss Other Clinician: Referring Provider: Vonita Moss Treating Provider/Extender: Altamese [place] in Treatment: 28 Subjective History of Present Illness (HPI) 01/07/17 this is a 83 year old woman admitted to the clinic today for review of a pressure ulcer on her lower sacrum. She is referred from her primary physician's office after being seen on 3/22 with a 3 cm pressure area. Her daughter and caretaker accompanied her today state that the area first became obvious about a month ago and his since deteriorated. They have recently got [name] a home health involved and have been using [name] to the wound. They have ordered a pressure relief surface for her mattress. They are turning her religiously. They state that she eats well and they've been forcing fluids on her. She is on a multivitamin. Looking through [facility] point last albumin I see was 4.4 on 10/28. The patient has advanced parkinsonism which looks superficially like advanced Parkinson's disease although her daughter tells me she did not ever respond to Sinemet therefore this may have another pathology with signs of parkinsonism. However I think this is largely a mute point currently. She also has dementia and is nonambulatory. Since this started they have been keeping her in bed and turning her religiously every 2 hours. She lives at home in [place] with her husband with 24/7 care giving 01/13/17 santyl change qd. Still will require further debridement. continue santyl. 01/20/17; patient's wound actually looks some better less adherent necrotic surface. There is actually visible granulation. We're using [name] 01/27/17; better-looking surface but still a lot of necrotic tissue on the base of this wound. The periwound erythema is  better than last week we are still using [name]. Her daughter tells [place] that she is still having trouble with the pressure-relief mattress through medical modalities 02/03/18; I had the patient scheduled for a two week followup however her daughter brought her in early concerned for discoloration on 2 areas of the wound circumference. We have bee using santyl 02/10/17;Better looking surface to the wound. Rim appears better suggesting better offloading. Using santyl 02/24/17; change to Silver Collegen last time. Wound appears better. 03/10/17; still using silver collagen religious offloading. Intake is satisfactory per her daughter. Dimension slightly better 03/12/2017 -- Dr. Jannetta Quint patient who had been seen 2 days ago and was doing fairly well. The patient is brought in by her daughter who noticed a new wound just above the previous wound on her sacral area and going on more to the left lateral side. She was very concerned and we asked her to get in for an opinion. 03/17/17; above is noted. The patient has developed a progressive area to the left of her original wound. This seems to this started with a ring of red skin with a more pale interior almost looking fungal. There was a rim of blister through part of the area although this did not look like zoster. They have been applying triamcinolone that was prescribed last week by Dr. Meyer Russel and the area has a fold to a linear band area which is confluent, erythematous and with obvious epidermal swelling but there is no overt tenderness or crepitus. She has lost some surface epithelium closer to the wound surface itself and now has a more  superficial wound in this area and the extending erythema goes towards the left buttock. This is well demarcated between involved in normal skin but once again does not appear to be at all tender. If there is a contact issue here I cannot get the history out of the daughter or the caregiver that are with her. 03/24/17; the  patient arrives today with the wound slightly worse slightly more drainage. The bandlike area of erythema that I treated as a possible pineal infection has improved somewhat although proximally is still has confluent erythema without overt tenderness. We have been using silver alginate since the most recent deterioration. To the bandlike degree of erythema we have been using Lotrisone cream 03/31/17; patient arrives today with the wound slightly larger, necrotic surface and surrounding erythema. The bandlike area of erythema that I treated as a possible pineal infection is less swollen but still present I've been using Lotrisone cream on that largely related to the presence of a tinea looking infection when this was first seen. We've been using silver alginate. X-ray that I ordered last week showed no acute bony abnormalities mild fecal impaction. Lab work showed a comprehensive metabolic panel that was normal including an albumin of 3.8. White count was 9.5 hemoglobin 12.3 differential count normal. C-reactive protein was less than 1 and sedimentation rate and 17. The latter 2 values does not support an ongoing bacterial infection. LALANIA, HASEMAN ([id number]) 04/14/17; patient arrives after a 2 week hiatus. Her wound is not in good condition. Although the base of the wound looks stable she still has an erythematous area that was apparently blistered over the weekend. This again points to the left. As our intake nurse pointed out today this is in the area where the tissue folds together and we may need to prevent try to prevent this. Lab work and x-ray that I did to 3 weeks ago were unremarkable including her albumin nevertheless she is an extremely frail condition physically. We have have been using [name] 04/22/17; I changed her to silver alginate because of the surrounding maceration and moisture last week. The daughter did not like the way the wound looked in the middle of the week and changed  her back to [place]. They're putting gauze on top of this. Thinks still using Lotrisone. 05/06/17 on evaluation today patient sacral wound appears to be doing okay and does not seem to be any worse. She is having no significant pain during evaluation today the secondary to mental status she is unable to rate or describe whether she had any pain she was not however flinching. Her daughter states that the wound does appear to be looking better to her. Still we are having difficulty with the skinfold that seems to be closing in on itself at this point. All in all I feel like she is making some good progress in the [name] seems to be the official for her. They do tell me that a refill if we are gonna continue that today. No fevers, chills, nausea, or vomiting noted at this time. 05/13/17 presents today for evaluation concerning her ongoing sacral pressure ulcer. Unfortunately she also has an area of deep tissue injury in the right Ischial region which is starting to show up. The sacral wound also continues to show signs of necrotic tissue overlying and has declined. Overall we really have not seen a significant improvement in the past several months in regard to the sacral wound and now patient is starting to develop a new wound  in the right Ischial region. Obviously this is not trending in the direction that we want to see. No fevers, chills, nausea, or vomiting noted at this time. 05/19/17; I have not seen this wound and almost a month however there is nothing really positive to say about it. Necrotic tissue over the surface which superiorly I think abuts on her sacrum. She has surrounding erythema. I would be surprised if there is not underlying osteomyelitis or soft tissue infection. 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 She apparently eats well per description although I wonder about this looking at her. Lab work I did probably 4 weeks ago however was really quite normal including a serum  albumin 05/26/17; culture I did last week grew Escherichia coli and methicillin sensitive staph aureus which should've been well covered by the Augmentin and ciprofloxacin. Indeed the erythema around the wound in the bed of the wound looks somewhat better. Her intake is still satisfactory. They now have a near fluidized bed 06/02/17; they completed the antibiotics last Friday. Using collagen. Daughter still reports eating and drinking well. There is less visualized erythema around the wound. 06/16/17; large pressure ulcer over her lower sacrum and coccyx. Using [name] to the wound bed. 06/30/17; certainly no change in dimensions of this large stage III wound over her sacrum and coccyx. They've been using [name]. There is no exposed bone. She has a candidal/tinea area in the right inguinal area. Other than that her daughter relates that she is eating and drinking well there changing her positioning to make sure the areas offloaded 07/14/17; no major change in the dimensions of this large stage III wound. Initially a smaller wound that became secondarily infected causing significant tissue breakdown although it is been stable in the last several weeks. Tunneling superiorly at roughly 1:00 no change here either. There is no bone palpable. Both the patient's daughter and caretaker states that she eats well. I have not rechecked her blood work 07/27/17; patient arrives in clinic today and generally a deteriorated looking state. Mild fever with axillary temperature of 100.4. Daughter reports she has not been eating and drinking well since yesterday. She looks more pale and thin and less responsive. We have been using silver collagen to her wound Patient History Unable to Obtain Patient History due to Altered Mental Status. Information obtained from Patient. Social History Never smoker, Marital Status - Married, Alcohol Use - Never, Drug Use - No History, Caffeine Use - Never. Medical And Surgical History  Notes Ear/Nose/Mouth/Throat dysphagia Neurologic parkinsons, tremor CHUDNEY, SCHEFFLER. ([id number]) Objective Constitutional Sitting or standing Blood Pressure is within target range for patient.. Pulse regular and within target range for patient.Marland Kitchen Respirations regular, non-labored and within target range. O2 sat is 96% on room air. Patient is febrile today.. The patient looks very frail, pale and even less responsive than per usual. Vitals Time Taken: 9:50 AM, Temperature: 100.4 F, Pulse: 53 bpm, Respiratory Rate: 16 breaths/min, Blood Pressure: 117/38 mmHg, Pulse Oximetry: 96 %. General Notes: axillary temp Eyes No scleral icterus. Ears, Nose, Mouth, and Throat Patient would not open her mouth. Neck No masses. Respiratory Respiratory effort is easy and symmetric bilaterally. Rate is normal at rest and on room air.. Few crackles in the right lower lobe although her pulse ox was 96%. Cardiovascular Heart sounds are normal. Not grossly dehydrated. Gastrointestinal (GI) No masses are palpable. No liver or spleen enlargement or tenderness.. Genitourinary (GU) No clear bladder distention. Psychiatric The patient is nonverbal which is  not new for her although less responsive.. General Notes: Wound exam; her extensive wound over the lower sacrum and coccyx is roughly the same. Had some purulent looking drainage coming from the right superior side of the wound at roughly 1 to 2:00 which I have cultured. There is duskiness around the wound but I can't really be certain that this is soft tissue infection. To the left there is a bandlike area perhaps another pressure area but this also could be related to underlying soft tissue infection Integumentary (Hair, Skin) Wound #1 status is Open. Original cause of wound was Pressure Injury. The wound is located on the Midline Coccyx. The wound measures 7cm length x 4.5cm width x 1.5cm depth; 24.74cm^2 area and 37.11cm^3 volume. There is muscle and  Fat Layer (Subcutaneous Tissue) Exposed exposed. There is no tunneling noted, however, there is undermining starting at 10:00 and ending at 2:00 with a maximum distance of 3cm. There is a large amount of purulent drainage noted. Foul odor after cleansing was noted. The wound margin is flat and intact. There is medium (34-66%) red granulation within the wound bed. There is a medium (34-66%) amount of necrotic tissue within the wound bed including Eschar and Adherent Slough. The periwound skin appearance exhibited: Induration, Erythema. The periwound skin appearance did not exhibit: Callus, Crepitus, Gauna, Ahni W. ([id number]) Excoriation, Rash, Scarring, Dry/Scaly, Maceration, Atrophie Blanche, Cyanosis, Ecchymosis, Hemosiderin Staining, Mottled, Pallor, Rubor. The surrounding wound skin color is noted with erythema which is circumferential. Periwound temperature was noted as No Abnormality. The periwound has tenderness on palpation. Wound #3 status is Open. Original cause of wound was Blister. The wound is located on the Left Gluteus. The wound measures 3.2cm length x 1.5cm width x 0.1cm depth; 3.77cm^2 area and 0.377cm^3 volume. There is no tunneling or undermining noted. There is a large amount of serous drainage noted. The wound margin is flat and intact. There is large (67- 100%) red granulation within the wound bed. There is no necrotic tissue within the wound bed. The periwound skin appearance did not exhibit: Callus, Crepitus, Excoriation, Induration, Rash, Scarring, Dry/Scaly, Maceration, Atrophie Blanche, Cyanosis, Ecchymosis, Hemosiderin Staining, Mottled, Pallor, Rubor, Erythema. Periwound temperature was noted as No Abnormality. The periwound has tenderness on palpation. Assessment Active Problems ICD-10 L89.153 - Pressure ulcer of sacral region, stage 3 G20 - Parkinson's disease L03.312 - Cellulitis of back [any part except buttock] Plan Wound Cleansing: Wound #1 Midline  Coccyx: Clean wound with Normal Saline. May Shower, gently pat wound dry prior to applying new dressing. Wound #3 Left Gluteus: Clean wound with Normal Saline. May Shower, gently pat wound dry prior to applying new dressing. Anesthetic: Wound #1 Midline Coccyx: Topical Lidocaine 4% cream applied to wound bed prior to debridement - in clinic Wound #3 Left Gluteus: Topical Lidocaine 4% cream applied to wound bed prior to debridement - in clinic Skin Barriers/Peri-Wound Care: Wound #1 Midline Coccyx: Skin Prep Wound #3 Left Gluteus: Skin Prep Primary Wound Dressing: Wound #1 Midline Coccyx: Prisma Ag Wound #3 Left Gluteus: Prisma Ag Secondary Dressing: Wound #1 Midline Coccyx: Saline moistened gauze - packed lightly into wound Boardered Foam Dressing - silicone bordered dressing due to skin breakdown Wound #3 Left Gluteus: JENNILYN, ESTEVE ([id number]) Boardered Foam Dressing Dressing Change Frequency: Wound #1 Midline Coccyx: Change dressing every day. Wound #3 Left Gluteus: Change dressing every day. Follow-up Appointments: Wound #1 Midline Coccyx: Return Appointment in 2 weeks. Wound #3 Left Gluteus: Return Appointment in 2 weeks. Off-Loading: Wound #1  Midline Coccyx: Roho cushion for wheelchair - [facility] please order roho cushion or gel cushion for patients wheelchair - whichever she qualifies for Turn and reposition every 2 hours Wound #3 Left Gluteus: Roho cushion for wheelchair - [facility] please order roho cushion or gel cushion for patients wheelchair - whichever she qualifies for Turn and reposition every 2 hours Additional Orders / Instructions: Wound #1 Midline Coccyx: Increase protein intake. - please add protein supplements to patients diet Other: - please add vitamin A, vitamin C and zinc supplements to patients diet Wound #3 Left Gluteus: Increase protein intake. - please add protein supplements to patients diet Other: - please add vitamin A, vitamin C and  zinc supplements to patients diet Home Health: Wound #1 Midline Coccyx: Continue Home Health Visits - [facility] Nurse may visit PRN to address patient s wound care needs. FACE TO FACE ENCOUNTER: MEDICARE and MEDICAID PATIENTS: I certify that this patient is under my care and that I had a face-to-face encounter that meets the physician face-to-face encounter requirements with this patient on this date. The encounter with the patient was in whole or in part for the following MEDICAL CONDITION: (primary reason for Home Healthcare) MEDICAL NECESSITY: I certify, that based on my findings, NURSING services are a medically necessary home health service. HOME BOUND STATUS: I certify that my clinical findings support that this patient is homebound (i.e., Due to illness or injury, pt requires aid of supportive devices such as crutches, cane, wheelchairs, walkers, the use of special transportation or the assistance of another person to leave their place of residence. There is a normal inability to leave the home and doing so requires considerable and taxing effort. Other absences are for medical reasons / religious services and are infrequent or of short duration when for other reasons). If current dressing causes regression in wound condition, may D/C ordered dressing product/s and apply Normal Saline Moist Dressing daily until next Wound Healing Center / Other MD appointment. Notify Wound Healing Center of regression in wound condition at [phone]. Please direct any NON-WOUND related issues/requests for orders to patient's Primary Care Physician - Dr Vonita Moss Wound #3 Left Gluteus: Continue Home Health Visits - [facility] Nurse may visit PRN to address patient s wound care needs. FACE TO FACE ENCOUNTER: MEDICARE and MEDICAID PATIENTS: I certify that this patient is under my care and that I had a face-to-face encounter that meets the physician face-to-face encounter  requirements with this patient on this date. The encounter with the patient was in whole or in part for the following MEDICAL CONDITION: (primary reason for Home Healthcare) MEDICAL NECESSITY: I certify, that based on my findings, NURSING services are a medically necessary home health service. HOME BOUND STATUS: I certify that my clinical findings support that this patient is homebound (i.e., Due to illness or injury, pt requires aid of supportive devices such as crutches, cane, wheelchairs, walkers, the use of special transportation or the assistance of another person to leave their place of residence. There is a normal inability to leave the home and doing so requires considerable and taxing effort. Other absences are for medical reasons / religious services and are infrequent or of short duration when for other reasons). If current dressing causes regression in wound condition, may D/C ordered dressing product/s and apply Normal Saline Moist Dressing daily until next Wound Healing Center / Other MD appointment. Notify Wound Healing Center of regression in wound condition at [phone]. Please direct any NON-WOUND related  issues/requests for orders to patient's Primary Care Physician - Dr Frederick Peers ([id number]) The following medication(s) was prescribed: cephalexin oral 250 mg/5 mL suspension for reconstitution suspension for reconstitution oral. 10 ml tid for 7 days (500mg  tid) for possilbe wound infection starting 07/28/2017 #1 the patient does not look nearly as well today as usual on the background of advanced dementia. Temperature is 100.3 she has not eating and more worrisome than that not drinking since yesterday #2 I had an fairly in-depth ethical discussion with the daughter outlining that this could be the start of a failure to thrive syndrome associated with end-of-life-type issues in somebody with end-stage Parkinson's dementia complex. She is not yet ready for  hospice care at home #3 we discussed the option of hospitalization which is certainly going to be necessary if she continues not to drink for IV hydration if for no other reason. She is likely then to require IV antibiotics imaging of the wound area. #4 careful physical examination did not reveal normal alternative cause of this deterioration. There was no suprapubic or costovertebral angle tenderness. She did have a few right lower lobe crackles but her pulse ox was good respirations free and easy. I think it is likely she has a wound infection #5 Keflex solution 500 mg 3 times a day for 7 days. The last viable culture of this wound wasn't August at which time she grew ecoli and methicillin sensitive staph aureus both sensitive to first generation cephalosporin #6 as mentioned this could be the end stages of an advanced neurodegenerative disease. I certainly suggested hospice is a possibility however the patient's daughter was not close to going there Electronic Signature(s) Signed: 07/28/2017 4:48:36 PM By: Baltazar Najjar MD Entered By: Baltazar Najjar on 07/28/2017 11:07:37 [name] ([id number]) -------------------------------------------------------------------------------- ROS/PFSH Details Patient Name: [name] Date of Service: 07/28/2017 10:15 AM Medical Record Number: [id number] Patient Account Number: [id number] Date of Birth/Sex: 11/28/1922 (83 y.o. Female) Treating RN: Curtis Sites Primary Care Provider: Vonita Moss Other Clinician: Referring Provider: Vonita Moss Treating Provider/Extender: Altamese [place] in Treatment: 37 Unable to Obtain Patient History due to oo Altered Mental Status Information Obtained From Patient Wound History Do you currently have one or more open woundso Yes How many open wounds do you currently haveo 1 Approximately how long have you had your woundso 1 week How have you been treating your wound(s) until nowo  santyl and gauze Has your wound(s) ever healed and then re-openedo No Have you had any lab work done in the past montho No Have you tested positive for an antibiotic resistant organism (MRSA, VRE)o No Have you tested positive for osteomyelitis (bone infection)o No Have you had any tests for circulation on your legso No Eyes Medical History: Negative for: Cataracts; Glaucoma; Optic Neuritis Ear/Nose/Mouth/Throat Medical History: Negative for: Chronic sinus problems/congestion; Middle ear problems Past Medical History Notes: dysphagia Hematologic/Lymphatic Medical History: Positive for: Anemia Negative for: Hemophilia; Human Immunodeficiency Virus; Lymphedema; Sickle Cell Disease Respiratory Medical History: Negative for: Aspiration; Asthma; Chronic Obstructive Pulmonary Disease (COPD); Pneumothorax; Sleep Apnea; Tuberculosis Cardiovascular Medical History: Positive for: Hypertension Negative for: Angina; Arrhythmia; Congestive Heart Failure; Coronary Artery Disease; Deep Vein Thrombosis; Hypotension; Myocardial Infarction; Peripheral Arterial Disease; Peripheral Venous Disease; Phlebitis; Vasculitis Gastrointestinal ITHA, KROEKER. ([id number]) Medical History: Negative for: Cirrhosis ; Colitis; Crohnos; Hepatitis A; Hepatitis B; Hepatitis C Endocrine Medical History: Negative for: Type I Diabetes; Type II Diabetes Genitourinary Medical History: Negative for: End Stage Renal Disease Immunological  Medical History: Negative for: Lupus Erythematosus; Raynaudos; Scleroderma Integumentary (Skin) Medical History: Negative for: History of Burn; History of pressure wounds Musculoskeletal Medical History: Negative for: Gout; Rheumatoid Arthritis; Osteoarthritis; Osteomyelitis Neurologic Medical History: Positive for: Dementia Negative for: Neuropathy; Quadriplegia; Paraplegia; Seizure Disorder Past Medical History Notes: parkinsons, tremor Oncologic Medical  History: Negative for: Received Chemotherapy; Received Radiation Psychiatric Medical History: Negative for: Anorexia/bulimia; Confinement Anxiety Immunizations Pneumococcal Vaccine: Received Pneumococcal Vaccination: Yes Immunization Notes: up to date Implantable Devices Family and Social History Never smoker; Marital Status - Married; Alcohol Use: Never; Drug Use: No History; Caffeine Use: Never; Financial Concerns: No; Food, Clothing or Shelter Needs: No; Support System Lacking: No; Transportation Concerns: No; Advanced Directives: Yes (Not Provided); Patient does not want information on Advanced Directives; Medical Power of Attorney: Yes - dtr (Not Provided) Elmarie MainlandSHAW, Zeidy W. ([id number]) Electronic Signature(s) Signed: 07/28/2017 4:37:56 PM By: Curtis Sitesorthy, Joanna Signed: 07/28/2017 4:48:36 PM By: Baltazar Najjarobson, Michael MD Entered By: Baltazar Najjarobson, Michael on 07/28/2017 10:52:06 Elmarie MainlandSHAW, Camaya W. ([id number]) -------------------------------------------------------------------------------- SuperBill Details Patient Name: Elmarie MainlandSHAW, Daje W. Date of Service: 07/28/2017 Medical Record Number: [id number] Patient Account Number: [id number] Date of Birth/Sex: October 20, 1922 (83 y.o. Female) Treating RN: Curtis Sitesorthy, Joanna Primary Care Provider: Vonita MossRISSMAN, MARK Other Clinician: Referring Provider: Vonita MossRISSMAN, MARK Treating Provider/Extender: Altamese CarolinaOBSON, MICHAEL G Weeks in Treatment: 28 Diagnosis Coding ICD-10 Codes Code Description L89.153 Pressure ulcer of sacral region, stage 3 G20 Parkinson's disease L03.312 Cellulitis of back [any part except buttock] Facility Procedures CPT4 Code: [id number] Description: 99213 - WOUND CARE VISIT-LEV 3 EST PT Modifier: Quantity: 1 Physician Procedures CPT4 Code: [id number] Description: 99214 - WC PHYS LEVEL 4 - EST PT ICD-10 Diagnosis Description L89.153 Pressure ulcer of sacral region, stage 3 L03.312 Cellulitis of back [any part except buttock] Modifier: Quantity:  1 Electronic Signature(s) Signed: 07/28/2017 3:06:22 PM By: Curtis Sitesorthy, Joanna Signed: 07/28/2017 4:48:36 PM By: Baltazar Najjarobson, Michael MD Entered By: Curtis Sitesorthy, Joanna on 07/28/2017 15:06:22

## 2017-07-30 LAB — HEPATIC FUNCTION PANEL
ALT: 9 (ref 7–35)
AST: 13 (ref 13–35)
Alkaline Phosphatase: 65 (ref 25–125)
Bilirubin, Direct: 0.2 (ref 0.01–0.4)

## 2017-07-30 LAB — BASIC METABOLIC PANEL
BUN: 16 (ref 4–21)
Creatinine: 0.4 — AB (ref <=1.1)
Potassium: 4 (ref 3.4–5.3)
Sodium: 138 (ref 137–147)

## 2017-07-30 NOTE — Progress Notes (Signed)
ZAINAH, STEVEN ([id number]) Visit Report for 07/28/2017 Arrival Information Details Patient Name: LINZI, OHLINGER Date of Service: 07/28/2017 10:15 AM Medical Record Number: [id number] Patient Account Number: [id number] Date of Birth/Sex: Oct 24, 1922 (83 y.o. Female) Treating RN: Curtis Sites Primary Care Donesha Wallander: Vonita Moss Other Clinician: Referring Estelita Iten: Vonita Moss Treating Corby Vandenberghe/Extender: Altamese [place] in Treatment: 28 Visit Information History Since Last Visit Added or deleted any medications: No Patient Arrived: Wheel Chair Any new allergies or adverse reactions: No Arrival Time: 09:49 Had a fall or experienced change in No activities of daily living that may affect Accompanied By: dtr and cg risk of falls: Transfer Assistance: Manual Signs or symptoms of abuse/neglect since No Patient Identification Verified: Yes last visito Secondary Verification Process Completed: Yes Hospitalized since last visit: No Patient Requires Transmission-Based No Has Dressing in Place as Prescribed: Yes Precautions: Pain Present Now: Unable to Patient Has Alerts: No Respond Electronic Signature(s) Signed: 07/28/2017 4:37:56 PM By: Curtis Sites Entered By: Curtis Sites on 07/28/2017 09:49:47 [name] ([id number]) -------------------------------------------------------------------------------- Clinic Level of Care Assessment Details Patient Name: [name] Date of Service: 07/28/2017 10:15 AM Medical Record Number: [id number] Patient Account Number: [id number] Date of Birth/Sex: [birth year] (83 y.o. Female) Treating RN: Curtis Sites Primary Care Kyllian Clingerman: Vonita Moss Other Clinician: Referring Zeda Gangwer: Vonita Moss Treating Makalyn Lennox/Extender: Altamese [place] in Treatment: 28 Clinic Level of Care Assessment Items TOOL 4 Quantity Score []  - Use when only an EandM is performed on FOLLOW-UP visit 0 ASSESSMENTS - Nursing  Assessment / Reassessment X - Reassessment of Co-morbidities (includes updates in patient status) 1 10 X- 1 5 Reassessment of Adherence to Treatment Plan ASSESSMENTS - Wound and Skin Assessment / Reassessment []  - Simple Wound Assessment / Reassessment - one wound 0 X- 2 5 Complex Wound Assessment / Reassessment - multiple wounds []  - 0 Dermatologic / Skin Assessment (not related to wound area) ASSESSMENTS - Focused Assessment []  - Circumferential Edema Measurements - multi extremities 0 []  - 0 Nutritional Assessment / Counseling / Intervention []  - 0 Lower Extremity Assessment (monofilament, tuning fork, pulses) []  - 0 Peripheral Arterial Disease Assessment (using hand held doppler) ASSESSMENTS - Ostomy and/or Continence Assessment and Care []  - Incontinence Assessment and Management 0 []  - 0 Ostomy Care Assessment and Management (repouching, etc.) PROCESS - Coordination of Care X - Simple Patient / Family Education for ongoing care 1 15 []  - 0 Complex (extensive) Patient / Family Education for ongoing care []  - 0 Staff obtains Chiropractor, Records, Test Results / Process Orders []  - 0 Staff telephones HHA, Nursing Homes / Clarify orders / etc []  - 0 Routine Transfer to another Facility (non-emergent condition) []  - 0 Routine Hospital Admission (non-emergent condition) []  - 0 New Admissions / Manufacturing engineer / Ordering NPWT, Apligraf, etc. []  - 0 Emergency Hospital Admission (emergent condition) X- 1 10 Simple Discharge Coordination SAREE, KROGH ([id number]) []  - 0 Complex (extensive) Discharge Coordination PROCESS - Special Needs []  - Pediatric / Minor Patient Management 0 []  - 0 Isolation Patient Management []  - 0 Hearing / Language / Visual special needs []  - 0 Assessment of Community assistance (transportation, D/C planning, etc.) []  - 0 Additional assistance / Altered mentation []  - 0 Support Surface(s) Assessment (bed, cushion, seat,  etc.) INTERVENTIONS - Wound Cleansing / Measurement []  - Simple Wound Cleansing - one wound 0 X- 2 5 Complex Wound Cleansing - multiple wounds X- 1 5 Wound Imaging (photographs - any number of wounds) []  -  0 Wound Tracing (instead of photographs) []  - 0 Simple Wound Measurement - one wound X- 2 5 Complex Wound Measurement - multiple wounds INTERVENTIONS - Wound Dressings X - Small Wound Dressing one or multiple wounds 2 10 []  - 0 Medium Wound Dressing one or multiple wounds []  - 0 Large Wound Dressing one or multiple wounds []  - 0 Application of Medications - topical []  - 0 Application of Medications - injection INTERVENTIONS - Miscellaneous []  - External ear exam 0 []  - 0 Specimen Collection (cultures, biopsies, blood, body fluids, etc.) []  - 0 Specimen(s) / Culture(s) sent or taken to Lab for analysis []  - 0 Patient Transfer (multiple staff / Nurse, adult / Similar devices) []  - 0 Simple Staple / Suture removal (25 or less) []  - 0 Complex Staple / Suture removal (26 or more) []  - 0 Hypo / Hyperglycemic Management (close monitor of Blood Glucose) []  - 0 Ankle / Brachial Index (ABI) - do not check if billed separately X- 1 5 Vital Signs Vandergrift, Duchess W. ([id number]) Has the patient been seen at the hospital within the last three years: Yes Total Score: 100 Level Of Care: New/Established - Level 3 Electronic Signature(s) Signed: 07/28/2017 4:37:56 PM By: Curtis Sites Entered By: Curtis Sites on 07/28/2017 15:06:13 [name] ([id number]) -------------------------------------------------------------------------------- Encounter Discharge Information Details Patient Name: [name] Date of Service: 07/28/2017 10:15 AM Medical Record Number: [id number] Patient Account Number: [id number] Date of Birth/Sex: [birth year] (83 y.o. Female) Treating RN: Curtis Sites Primary Care Jennae Hakeem: Vonita Moss Other Clinician: Referring Debhora Titus: Vonita Moss Treating Michaeleen Down/Extender: Altamese [place] in Treatment: 28 Encounter Discharge Information Items Discharge Pain Level: 0 Discharge Condition: Stable Ambulatory Status: Wheelchair Discharge Destination: Home Transportation: Private Auto Accompanied By: dtr and cg Schedule Follow-up Appointment: Yes Medication Reconciliation completed and No provided to Patient/Care Blandina Renaldo: Provided on Clinical Summary of Care: 07/28/2017 Form Type Recipient Paper Patient ES Electronic Signature(s) Signed: 07/29/2017 9:28:38 AM By: Gwenlyn Perking Entered By: Gwenlyn Perking on 07/28/2017 10:55:22 [name] ([id number]) -------------------------------------------------------------------------------- Multi Wound Chart Details Patient Name: [name] Date of Service: 07/28/2017 10:15 AM Medical Record Number: [id number] Patient Account Number: [id number] Date of Birth/Sex: 08/04/1923 (83 y.o. Female) Treating RN: Curtis Sites Primary Care Zaquan Duffner: Vonita Moss Other Clinician: Referring Keiasia Christianson: Vonita Moss Treating Josee Speece/Extender: Altamese [employer] in Treatment: 28 Vital Signs Height(in): Pulse(bpm): 53 Weight(lbs): Blood Pressure(mmHg): 117/38 Body Mass Index(BMI): Temperature(F): 100.4 Respiratory Rate 16 (breaths/min): Photos: [1:No Photos] [3:No Photos] [N/A:N/A] Wound Location: [1:Coccyx - Midline] [3:Left Gluteus] [N/A:N/A] Wounding Event: [1:Pressure Injury] [3:Blister] [N/A:N/A] Primary Etiology: [1:Pressure Ulcer] [3:Fungal] [N/A:N/A] Comorbid History: [1:Anemia, Hypertension, Dementia] [3:Anemia, Hypertension, Dementia] [N/A:N/A] Date Acquired: [1:12/01/2016] [3:07/26/2017] [N/A:N/A] Weeks of Treatment: [1:28] [3:0] [N/A:N/A] Wound Status: [1:Open] [3:Open] [N/A:N/A] Measurements L x W x D [1:7x4.5x1.5] [3:3.2x1.5x0.1] [N/A:N/A] (cm) Area (cm) : [1:24.74] [3:3.77] [N/A:N/A] Volume (cm) : [1:37.11] [3:0.377] [N/A:N/A] %  Reduction in Area: [1:-495.40%] [3:N/A] [N/A:N/A] % Reduction in Volume: [1:-2132.90%] [3:N/A] [N/A:N/A] Starting Position 1 [1:10] (o'clock): Ending Position 1 [1:2] (o'clock): Maximum Distance 1 (cm): [1:3] Undermining: [1:Yes] [3:No] [N/A:N/A] Classification: [1:Category/Stage IV] [3:Partial Thickness] [N/A:N/A] Exudate Amount: [1:Large] [3:Large] [N/A:N/A] Exudate Type: [1:Purulent] [3:Serous] [N/A:N/A] Exudate Color: [1:yellow, brown, green] [3:amber] [N/A:N/A] Foul Odor After Cleansing: [1:Yes] [3:No] [N/A:N/A] Odor Anticipated Due to [1:No] [3:N/A] [N/A:N/A] Product Use: Wound Margin: [1:Flat and Intact] [3:Flat and Intact] [N/A:N/A] Granulation Amount: [1:Medium (34-66%)] [3:Large (67-100%)] [N/A:N/A] Granulation Quality: [1:Red] [3:Red] [N/A:N/A] Necrotic Amount: [1:Medium (34-66%)] [3:None Present (0%)] [N/A:N/A] Necrotic Tissue: [  1:Eschar, Adherent Slough] [3:N/A] [N/A:N/A] Exposed Structures: [1:Fat Layer (Subcutaneous Tissue) Exposed: Yes Muscle: Yes Fascia: No] [3:Fascia: No Fat Layer (Subcutaneous Tissue) Exposed: No Tendon: No] [N/A:N/A] Tendon: No Muscle: No Joint: No Joint: No Bone: No Bone: No Epithelialization: None Medium (34-66%) N/A Periwound Skin Texture: Induration: Yes Excoriation: No N/A Excoriation: No Induration: No Callus: No Callus: No Crepitus: No Crepitus: No Rash: No Rash: No Scarring: No Scarring: No Periwound Skin Moisture: Maceration: No Maceration: No N/A Dry/Scaly: No Dry/Scaly: No Periwound Skin Color: Erythema: Yes Atrophie Blanche: No N/A Atrophie Blanche: No Cyanosis: No Cyanosis: No Ecchymosis: No Ecchymosis: No Erythema: No Hemosiderin Staining: No Hemosiderin Staining: No Mottled: No Mottled: No Pallor: No Pallor: No Rubor: No Rubor: No Erythema Location: Circumferential N/A N/A Temperature: No Abnormality No Abnormality N/A Tenderness on Palpation: Yes Yes N/A Wound Preparation: Ulcer Cleansing: Ulcer  Cleansing: N/A Rinsed/Irrigated with Saline Rinsed/Irrigated with Saline Topical Anesthetic Applied: Topical Anesthetic Applied: Other: lidocaine 4% None Treatment Notes Electronic Signature(s) Signed: 07/28/2017 4:48:36 PM By: Baltazar Najjarobson, Michael MD Entered By: Baltazar Najjarobson, Michael on 07/28/2017 10:50:21 Elmarie MainlandSHAW, Laylana W. ([id number]) -------------------------------------------------------------------------------- Multi-Disciplinary Care Plan Details Patient Name: Elmarie MainlandSHAW, Forever W. Date of Service: 07/28/2017 10:15 AM Medical Record Number: [id number] Patient Account Number: [id number] Date of Birth/Sex: 1923/04/21 (83 y.o. Female) Treating RN: Curtis Sitesorthy, Joanna Primary Care Kamika Goodloe: Vonita MossRISSMAN, MARK Other Clinician: Referring Roslin Norwood: Vonita MossRISSMAN, MARK Treating Laiah Pouncey/Extender: Altamese CarolinaOBSON, MICHAEL G Weeks in Treatment: 28 Active Inactive ` Abuse / Safety / Falls / Self Care Management Nursing Diagnoses: Impaired physical mobility Potential for falls Goals: Patient will remain injury free Date Initiated: 01/07/2017 Target Resolution Date: 04/03/2017 Goal Status: Active Interventions: Assess fall risk on admission and as needed Notes: ` Nutrition Nursing Diagnoses: Potential for alteratiion in Nutrition/Potential for imbalanced nutrition Goals: Patient/caregiver agrees to and verbalizes understanding of need to use nutritional supplements and/or vitamins as prescribed Date Initiated: 01/07/2017 Target Resolution Date: 04/03/2017 Goal Status: Active Interventions: Assess patient nutrition upon admission and as needed per policy Notes: ` Orientation to the Wound Care Program Nursing Diagnoses: Knowledge deficit related to the wound healing center program Goals: Patient/caregiver will verbalize understanding of the Wound Healing Center Program Date Initiated: 01/07/2017 Target Resolution Date: 04/03/2017 Goal Status: Active Elmarie MainlandSHAW, Iliany W. ([id number]) Interventions: Provide education on  orientation to the wound center Notes: ` Pressure Nursing Diagnoses: Knowledge deficit related to causes and risk factors for pressure ulcer development Goals: Patient will remain free from development of additional pressure ulcers Date Initiated: 01/07/2017 Target Resolution Date: 04/03/2017 Goal Status: Active Interventions: Assess potential for pressure ulcer upon admission and as needed Notes: ` Wound/Skin Impairment Nursing Diagnoses: Impaired tissue integrity Goals: Patient/caregiver will verbalize understanding of skin care regimen Date Initiated: 01/07/2017 Target Resolution Date: 04/03/2017 Goal Status: Active Ulcer/skin breakdown will have a volume reduction of 30% by week 4 Date Initiated: 01/07/2017 Target Resolution Date: 04/03/2017 Goal Status: Active Ulcer/skin breakdown will have a volume reduction of 50% by week 8 Date Initiated: 01/07/2017 Target Resolution Date: 04/03/2017 Goal Status: Active Ulcer/skin breakdown will have a volume reduction of 80% by week 12 Date Initiated: 01/07/2017 Target Resolution Date: 04/03/2017 Goal Status: Active Ulcer/skin breakdown will heal within 14 weeks Date Initiated: 01/07/2017 Target Resolution Date: 04/03/2017 Goal Status: Active Interventions: Assess patient/caregiver ability to obtain necessary supplies Assess patient/caregiver ability to perform ulcer/skin care regimen upon admission and as needed Assess ulceration(s) every visit Notes: Electronic Signature(s) Elmarie MainlandSHAW, Tahra W. ([id number]) Signed: 07/28/2017 4:37:56 PM By: Curtis Sitesorthy, Joanna Entered By: Curtis Sitesorthy, Joanna on 07/28/2017 10:15:33  ANIYA, JOLICOEUR ([id number]) -------------------------------------------------------------------------------- Pain Assessment Details Patient Name: DENNETTE, FAULCONER Date of Service: 07/28/2017 10:15 AM Medical Record Number: [id number] Patient Account Number: [id number] Date of Birth/Sex: 01-Jul-1923 (83 y.o. Female) Treating RN: Curtis Sites Primary Care Mintie Witherington: Vonita Moss Other Clinician: Referring Chaim Gatley: Vonita Moss Treating Shallyn Constancio/Extender: Altamese [place] in Treatment: 28 Active Problems Location of Pain Severity and Description of Pain Patient Has Paino Patient Unable to Respond Site Locations Pain Management and Medication Current Pain Management: Electronic Signature(s) Signed: 07/28/2017 4:37:56 PM By: Curtis Sites Entered By: Curtis Sites on 07/28/2017 09:49:56 [name] ([id number]) -------------------------------------------------------------------------------- Patient/Caregiver Education Details Patient Name: [name] Date of Service: 07/28/2017 10:15 AM Medical Record Number: [id number] Patient Account Number: [id number] Date of Birth/Gender: 02-19-1923 (83 y.o. Female) Treating RN: Curtis Sites Primary Care Physician: Vonita Moss Other Clinician: Referring Physician: Vonita Moss Treating Physician/Extender: Altamese [place] in Treatment: 28 Education Assessment Education Provided To: Caregiver Education Topics Provided Wound/Skin Impairment: Handouts: Other: wound care as ordered Methods: Demonstration, Explain/Verbal Responses: State content correctly Electronic Signature(s) Signed: 07/28/2017 4:37:56 PM By: Curtis Sites Entered By: Curtis Sites on 07/28/2017 10:17:04 [name] ([id number]) -------------------------------------------------------------------------------- Wound Assessment Details Patient Name: [name] Date of Service: 07/28/2017 10:15 AM Medical Record Number: [id number] Patient Account Number: [id number] Date of Birth/Sex: 1923/05/26 (83 y.o. Female) Treating RN: Curtis Sites Primary Care Teriann Livingood: Vonita Moss Other Clinician: Referring Tazia Illescas: Vonita Moss Treating Simmie Garin/Extender: Altamese [place] in Treatment: 28 Wound Status Wound Number: 1 Primary Etiology: Pressure  Ulcer Wound Location: Coccyx - Midline Wound Status: Open Wounding Event: Pressure Injury Comorbid History: Anemia, Hypertension, Dementia Date Acquired: 12/01/2016 Weeks Of Treatment: 28 Clustered Wound: No Photos Photo Uploaded By: Curtis Sites on 07/28/2017 16:35:40 Wound Measurements Length: (cm) 7 Width: (cm) 4.5 Depth: (cm) 1.5 Area: (cm) 24.74 Volume: (cm) 37.11 % Reduction in Area: -495.4% % Reduction in Volume: -2132.9% Epithelialization: None Tunneling: No Undermining: Yes Starting Position (o'clock): 10 Ending Position (o'clock): 2 Maximum Distance: (cm) 3 Wound Description Classification: Category/Stage IV Wound Margin: Flat and Intact Exudate Amount: Large Exudate Type: Purulent Exudate Color: yellow, brown, green Foul Odor After Cleansing: Yes Due to Product Use: No Slough/Fibrino Yes Wound Bed Granulation Amount: Medium (34-66%) Exposed Structure Granulation Quality: Red Fascia Exposed: No Necrotic Amount: Medium (34-66%) Fat Layer (Subcutaneous Tissue) Exposed: Yes Necrotic Quality: Eschar, Adherent Slough Tendon Exposed: No Muscle Exposed: Yes ISABELL, BONAFEDE. ([id number]) Necrosis of Muscle: No Joint Exposed: No Bone Exposed: No Periwound Skin Texture Texture Color No Abnormalities Noted: No No Abnormalities Noted: No Callus: No Atrophie Blanche: No Crepitus: No Cyanosis: No Excoriation: No Ecchymosis: No Induration: Yes Erythema: Yes Rash: No Erythema Location: Circumferential Scarring: No Hemosiderin Staining: No Mottled: No Moisture Pallor: No No Abnormalities Noted: No Rubor: No Dry / Scaly: No Maceration: No Temperature / Pain Temperature: No Abnormality Tenderness on Palpation: Yes Wound Preparation Ulcer Cleansing: Rinsed/Irrigated with Saline Topical Anesthetic Applied: Other: lidocaine 4%, Treatment Notes Wound #1 (Midline Coccyx) 1. Cleansed with: Clean wound with Normal Saline 2. Anesthetic Topical  Lidocaine 4% cream to wound bed prior to debridement 4. Dressing Applied: Prisma Ag 5. Secondary Dressing Applied Bordered Foam Dressing Saline moistened guaze Electronic Signature(s) Signed: 07/28/2017 4:37:56 PM By: Curtis Sites Entered By: Curtis Sites on 07/28/2017 10:08:39 [name] ([id number]) -------------------------------------------------------------------------------- Wound Assessment Details Patient Name: [name] Date of Service: 07/28/2017 10:15 AM Medical Record Number: [id number] Patient Account Number: [id number] Date of Birth/Sex: March 20, 1923 (83  y.o. Female) Treating RN: Curtis Sites Primary Care Shaye Lagace: Vonita Moss Other Clinician: Referring Alexias Margerum: Vonita Moss Treating Keyuna Cuthrell/Extender: Maxwell Caul Weeks in Treatment: 28 Wound Status Wound Number: 3 Primary Etiology: Fungal Wound Location: Left Gluteus Wound Status: Open Wounding Event: Blister Comorbid History: Anemia, Hypertension, Dementia Date Acquired: 07/26/2017 Weeks Of Treatment: 0 Clustered Wound: No Photos Photo Uploaded By: Curtis Sites on 07/28/2017 16:35:40 Wound Measurements Length: (cm) 3.2 Width: (cm) 1.5 Depth: (cm) 0.1 Area: (cm) 3.77 Volume: (cm) 0.377 % Reduction in Area: % Reduction in Volume: Epithelialization: Medium (34-66%) Tunneling: No Undermining: No Wound Description Classification: Partial Thickness Wound Margin: Flat and Intact Exudate Amount: Large Exudate Type: Serous Exudate Color: amber Foul Odor After Cleansing: No Slough/Fibrino No Wound Bed Granulation Amount: Large (67-100%) Exposed Structure Granulation Quality: Red Fascia Exposed: No Necrotic Amount: None Present (0%) Fat Layer (Subcutaneous Tissue) Exposed: No Tendon Exposed: No Muscle Exposed: No Joint Exposed: No Bone Exposed: No Periwound Skin Texture CAMBELL, RICKENBACH. ([id number]) Texture Color No Abnormalities Noted: No No Abnormalities Noted:  No Callus: No Atrophie Blanche: No Crepitus: No Cyanosis: No Excoriation: No Ecchymosis: No Induration: No Erythema: No Rash: No Hemosiderin Staining: No Scarring: No Mottled: No Pallor: No Moisture Rubor: No No Abnormalities Noted: No Dry / Scaly: No Temperature / Pain Maceration: No Temperature: No Abnormality Tenderness on Palpation: Yes Wound Preparation Ulcer Cleansing: Rinsed/Irrigated with Saline Topical Anesthetic Applied: None Treatment Notes Wound #3 (Left Gluteus) 1. Cleansed with: Clean wound with Normal Saline 2. Anesthetic Topical Lidocaine 4% cream to wound bed prior to debridement 4. Dressing Applied: Prisma Ag 5. Secondary Dressing Applied Bordered Foam Dressing Saline moistened guaze Electronic Signature(s) Signed: 07/28/2017 4:37:56 PM By: Curtis Sites Entered By: Curtis Sites on 07/28/2017 10:12:37 [name] ([id number]) -------------------------------------------------------------------------------- Vitals Details Patient Name: [name] Date of Service: 07/28/2017 10:15 AM Medical Record Number: [id number] Patient Account Number: [id number] Date of Birth/Sex: [birth year] (83 y.o. Female) Treating RN: Curtis Sites Primary Care Damonie Ellenwood: Vonita Moss Other Clinician: Referring Pinchos Topel: Vonita Moss Treating Davy Faught/Extender: Altamese [place] in Treatment: 28 Vital Signs Time Taken: 09:50 Temperature (F): 100.4 Pulse (bpm): 53 Respiratory Rate (breaths/min): 16 Blood Pressure (mmHg): 117/38 Reference Range: 80 - 120 mg / dl Pulse Oximetry (%): 96 Notes axillary temp Electronic Signature(s) Signed: 07/28/2017 4:37:56 PM By: Curtis Sites Entered By: Curtis Sites on 07/28/2017 10:30:35

## 2017-08-01 LAB — AEROBIC CULTURE  (SUPERFICIAL SPECIMEN)

## 2017-08-01 LAB — AEROBIC CULTURE W GRAM STAIN (SUPERFICIAL SPECIMEN)

## 2017-08-04 ENCOUNTER — Encounter: Payer: Medicare Other | Admitting: Internal Medicine

## 2017-08-11 ENCOUNTER — Encounter: Payer: Medicare Other | Attending: Internal Medicine | Admitting: Internal Medicine

## 2017-08-11 DIAGNOSIS — F0390 Unspecified dementia without behavioral disturbance: Secondary | ICD-10-CM | POA: Insufficient documentation

## 2017-08-11 DIAGNOSIS — G20 Parkinson's disease: Secondary | ICD-10-CM | POA: Diagnosis not present

## 2017-08-11 DIAGNOSIS — L89153 Pressure ulcer of sacral region, stage 3: Secondary | ICD-10-CM | POA: Insufficient documentation

## 2017-08-11 DIAGNOSIS — S81811D Laceration without foreign body, right lower leg, subsequent encounter: Secondary | ICD-10-CM | POA: Diagnosis not present

## 2017-08-11 DIAGNOSIS — L03312 Cellulitis of back [any part except buttock]: Secondary | ICD-10-CM | POA: Diagnosis not present

## 2017-08-11 DIAGNOSIS — X58XXXD Exposure to other specified factors, subsequent encounter: Secondary | ICD-10-CM | POA: Insufficient documentation

## 2017-08-15 NOTE — Progress Notes (Signed)
Tina MainlandSHAW, Korissa W. ([id number]) Visit Report for 08/11/2017 Arrival Information Details Patient Name: Tina MainlandSHAW, Ellyson W. Date of Service: 08/11/2017 10:15 AM Medical Record Number: [id number] Patient Account Number: [id number] Date of Birth/Sex: March 02, 1923 (83 y.o. Female) Treating RN: Renne CriglerFlinchum, Cheryl Primary Care Rangel Echeverri: Vonita MossRISSMAN, MARK Other Clinician: Referring Wayburn Shaler: Vonita MossRISSMAN, MARK Treating Saphire Barnhart/Extender: Altamese CarolinaOBSON, MICHAEL G Weeks in Treatment: 30 Visit Information History Since Last Visit All ordered tests and consults were completed: No Patient Arrived: Wheel Chair Added or deleted any medications: No Arrival Time: 10:02 Any new allergies or adverse reactions: No Accompanied By: csregivers Had a fall or experienced change in No Transfer Assistance: EasyPivot Patient activities of daily living that may affect Lift risk of falls: Patient Identification Verified: Yes Signs or symptoms of abuse/neglect since last visito No Secondary Verification Process Yes Hospitalized since last visit: No Completed: Pain Present Now: No Patient Requires Transmission-Based No Precautions: Patient Has Alerts: No Electronic Signature(s) Signed: 08/11/2017 4:47:02 PM By: Renne CriglerFlinchum, Cheryl Entered By: Renne CriglerFlinchum, Cheryl on 08/11/2017 10:05:00 Tina MainlandSHAW, Essie W. ([id number]) -------------------------------------------------------------------------------- Encounter Discharge Information Details Patient Name: Tina MainlandSHAW, Adelise W. Date of Service: 08/11/2017 10:15 AM Medical Record Number: [id number] Patient Account Number: [id number] Date of Birth/Sex: March 02, 1923 (83 y.o. Female) Treating RN: Renne CriglerFlinchum, Cheryl Primary Care Deionna Marcantonio: Vonita MossRISSMAN, MARK Other Clinician: Referring Emet Rafanan: Vonita MossRISSMAN, MARK Treating Ario Mcdiarmid/Extender: Altamese CarolinaOBSON, MICHAEL G Weeks in Treatment: 30 Encounter Discharge Information Items Discharge Pain Level: 0 Discharge Condition: Stable Ambulatory Status: Wheelchair Discharge  Destination: Home Transportation: Private Auto daughter and Accompanied By: cafregiver Schedule Follow-up Appointment: Yes Medication Reconciliation completed and No provided to Patient/Care Kieron Kantner: Provided on Clinical Summary of Care: 08/11/2017 Form Type Recipient Paper Patient ES Electronic Signature(s) Signed: 08/12/2017 11:35:39 AM By: Gwenlyn PerkingMoore, Shelia Entered By: Gwenlyn PerkingMoore, Shelia on 08/11/2017 10:55:30 Tina MainlandSHAW, Adanely W. ([id number]) -------------------------------------------------------------------------------- Lower Extremity Assessment Details Patient Name: Tina MainlandSHAW, Dayanara W. Date of Service: 08/11/2017 10:15 AM Medical Record Number: [id number] Patient Account Number: [id number] Date of Birth/Sex: March 02, 1923 (83 y.o. Female) Treating RN: Renne CriglerFlinchum, Cheryl Primary Care Alexiah Koroma: Vonita MossRISSMAN, MARK Other Clinician: Referring Keylani Perlstein: Vonita MossRISSMAN, MARK Treating Tamerra Merkley/Extender: Altamese CarolinaOBSON, MICHAEL G Weeks in Treatment: 30 Edema Assessment Assessed: [Left: No] [Right: No] Edema: [Left: N] [Right: o] Vascular Assessment Claudication: Claudication Assessment [Right:Intermittent] Pulses: Dorsalis Pedis Palpable: [Right:Yes] Posterior Tibial Extremity colors, hair growth, and conditions: Extremity Color: [Right:Pale] Hair Growth on Extremity: [Right:No] Temperature of Extremity: [Right:Cool] Capillary Refill: [Right:> 3 seconds] Electronic Signature(s) Signed: 08/11/2017 4:47:02 PM By: Renne CriglerFlinchum, Cheryl Entered By: Renne CriglerFlinchum, Cheryl on 08/11/2017 10:22:00 Tina MainlandSHAW, Justyne W. ([id number]) -------------------------------------------------------------------------------- Multi Wound Chart Details Patient Name: Tina MainlandSHAW, Angelena W. Date of Service: 08/11/2017 10:15 AM Medical Record Number: [id number] Patient Account Number: [id number] Date of Birth/Sex: March 02, 1923 (83 y.o. Female) Treating RN: Renne CriglerFlinchum, Cheryl Primary Care Reginald Mangels: Vonita MossRISSMAN, MARK Other Clinician: Referring Vada Swift:  Vonita MossRISSMAN, MARK Treating Roselle Norton/Extender: Altamese CarolinaOBSON, MICHAEL G Weeks in Treatment: 30 Vital Signs Height(in): 60 Pulse(bpm): 84 Weight(lbs): 100 Blood Pressure(mmHg): 120/98 Body Mass Index(BMI): 20 Temperature(F): 98.1 Respiratory Rate 18 (breaths/min): Photos: [1:No Photos] [3:No Photos] [4:No Photos] Wound Location: [1:Coccyx - Midline] [3:Left Gluteus] [4:Right Lower Leg - Anterior] Wounding Event: [1:Pressure Injury] [3:Blister] [4:Trauma] Primary Etiology: [1:Pressure Ulcer] [3:Fungal] [4:Skin Tear] Comorbid History: [1:Anemia, Hypertension, Dementia] [3:Anemia, Hypertension, Dementia] [4:Anemia, Hypertension, Dementia] Date Acquired: [1:12/01/2016] [3:07/26/2017] [4:08/11/2017] Weeks of Treatment: [1:30] [3:2] [4:0] Wound Status: [1:Open] [3:Healed - Epithelialized] [4:Open] Clustered Wound: [1:No] [3:No] [4:Yes] Clustered Quantity: [1:N/A] [3:N/A] [4:2] Measurements L x W x D [1:5.5x5.7x0.6] [3:0x0x0] [4:3x8.2x0.1] (cm) Area (cm) : [1:24.622] [3:0] [4:19.321] Volume (cm) : [1:14.773] [3:0] [4:1.932] % Reduction in  Area: [1:-492.60%] [3:100.00%] [4:N/A] % Reduction in Volume: [1:-788.90%] [3:100.00%] [4:N/A] Starting Position 1 [1:5] [3:5] (o'clock): Ending Position 1 [1:12] [3:11] (o'clock): Maximum Distance 1 (cm): [1:1.2] [3:0.8] Starting Position 2 [1:1] [3:1] (o'clock): Ending Position 2 [1:5] [3:5] (o'clock): Maximum Distance 2 (cm): [1:3.2] [3:3] Undermining: [1:Yes] [3:Yes] [4:No] Classification: [1:Category/Stage IV] [3:Full Thickness Without Exposed Support Structures] [4:Partial Thickness] Exudate Amount: [1:Large] [3:Large] [4:Medium] Exudate Type: [1:Purulent] [3:Serous] [4:Sanguinous] Exudate Color: [1:yellow, brown, green] [3:amber] [4:red] Foul Odor After Cleansing: [1:Yes] [3:No] [4:No] Odor Anticipated Due to [1:No] [3:N/A] [4:N/A] Product Use: Wound Margin: [1:Flat and Intact] [3:Flat and Intact] [4:Flat and Intact] Granulation Amount:  Medium (34-66%) Large (67-100%) None Present (0%) Granulation Quality: Red Red N/A Necrotic Amount: Medium (34-66%) None Present (0%) Large (67-100%) Necrotic Tissue: Eschar, Adherent Slough N/A Eschar Exposed Structures: Fat Layer (Subcutaneous Fat Layer (Subcutaneous Fascia: No Tissue) Exposed: Yes Tissue) Exposed: Yes Fat Layer (Subcutaneous Muscle: Yes Fascia: No Tissue) Exposed: No Fascia: No Tendon: No Tendon: No Tendon: No Muscle: No Muscle: No Joint: No Joint: No Joint: No Bone: No Bone: No Bone: No Epithelialization: None Medium (34-66%) None Debridement: N/A N/A Open Wound/Selective ([id number]) - Selective Pre-procedure N/A N/A 10:32 Verification/Time Out Taken: Pain Control: N/A N/A Other Tissue Debrided: N/A N/A Skin Level: N/A N/A Non-Viable Tissue Debridement Area (sq cm): N/A N/A 0.5 Instrument: N/A N/A Scissors Bleeding: N/A N/A Minimum Hemostasis Achieved: N/A N/A Pressure Procedural Pain: N/A N/A 0 Post Procedural Pain: N/A N/A 0 Debridement Treatment N/A N/A Procedure was tolerated well Response: Post Debridement N/A N/A 3x8.2x0.1 Measurements L x W x D (cm) Post Debridement Volume: N/A N/A 1.932 (cm) Periwound Skin Texture: Induration: Yes Excoriation: No Excoriation: No Excoriation: No Induration: No Induration: No Callus: No Callus: No Callus: No Crepitus: No Crepitus: No Crepitus: No Rash: No Rash: No Rash: No Scarring: No Scarring: No Scarring: No Periwound Skin Moisture: Maceration: No Maceration: No Maceration: No Dry/Scaly: No Dry/Scaly: No Dry/Scaly: No Periwound Skin Color: Erythema: Yes Atrophie Blanche: No Atrophie Blanche: No Atrophie Blanche: No Cyanosis: No Cyanosis: No Cyanosis: No Ecchymosis: No Ecchymosis: No Ecchymosis: No Erythema: No Erythema: No Hemosiderin Staining: No Hemosiderin Staining: No Hemosiderin Staining: No Mottled: No Mottled: No Mottled: No Pallor: No Pallor:  No Pallor: No Rubor: No Rubor: No Rubor: No Erythema Location: Circumferential N/A N/A Temperature: No Abnormality No Abnormality N/A Tenderness on Palpation: Yes Yes No Wound Preparation: Ulcer Cleansing: Ulcer Cleansing: Ulcer Cleansing: Rinsed/Irrigated with Saline Rinsed/Irrigated with Saline Rinsed/Irrigated with Saline Topical Anesthetic Applied: Topical Anesthetic Applied: Topical Anesthetic Applied: Other: lidocaine 4% None Other: lidocaine 4% Procedures Performed: N/A N/A Debridement RYIN, SCHILLO ([id number]) Treatment Notes Wound #1 (Midline Coccyx) 1. Cleansed with: Clean wound with Normal Saline 2. Anesthetic Topical Lidocaine 4% cream to wound bed prior to debridement 3. Peri-wound Care: Skin Prep 4. Dressing Applied: Prisma Ag 5. Secondary Dressing Applied Bordered Foam Dressing Dry Gauze Wound #4 (Right, Anterior Lower Leg) 1. Cleansed with: Clean wound with Normal Saline 2. Anesthetic Topical Lidocaine 4% cream to wound bed prior to debridement 3. Peri-wound Care: Skin Prep 4. Dressing Applied: Other dressing (specify in notes) 5. Secondary Dressing Applied Tegaderm Notes triple antibiotic cream to wound bed Electronic Signature(s) Signed: 08/12/2017 4:36:42 PM By: Baltazar Najjar MD Entered By: Baltazar Najjar on 08/11/2017 10:54:18 [name] ([id number]) -------------------------------------------------------------------------------- Multi-Disciplinary Care Plan Details Patient Name: [name] Date of Service: 08/11/2017 10:15 AM Medical Record Number: [id number] Patient Account Number: [id number] Date of Birth/Sex: 23-Aug-1923 (84 y.o. Female)  Treating RN: Renne Crigler Primary Care Yamil Dougher: Vonita Moss Other Clinician: Referring Leiby Pigeon: Vonita Moss Treating Enio Hornback/Extender: Altamese [place] in Treatment: 30 Active Inactive ` Abuse / Safety / Falls / Self Care Management Nursing Diagnoses: Impaired  physical mobility Potential for falls Goals: Patient will remain injury free Date Initiated: 01/07/2017 Target Resolution Date: 04/03/2017 Goal Status: Active Interventions: Assess fall risk on admission and as needed Notes: ` Nutrition Nursing Diagnoses: Potential for alteratiion in Nutrition/Potential for imbalanced nutrition Goals: Patient/caregiver agrees to and verbalizes understanding of need to use nutritional supplements and/or vitamins as prescribed Date Initiated: 01/07/2017 Target Resolution Date: 04/03/2017 Goal Status: Active Interventions: Assess patient nutrition upon admission and as needed per policy Notes: ` Orientation to the Wound Care Program Nursing Diagnoses: Knowledge deficit related to the wound healing center program Goals: Patient/caregiver will verbalize understanding of the Wound Healing Center Program Date Initiated: 01/07/2017 Target Resolution Date: 04/03/2017 Goal Status: Active TALAH, COOKSTON ([id number]) Interventions: Provide education on orientation to the wound center Notes: ` Pressure Nursing Diagnoses: Knowledge deficit related to causes and risk factors for pressure ulcer development Goals: Patient will remain free from development of additional pressure ulcers Date Initiated: 01/07/2017 Target Resolution Date: 04/03/2017 Goal Status: Active Interventions: Assess potential for pressure ulcer upon admission and as needed Notes: ` Wound/Skin Impairment Nursing Diagnoses: Impaired tissue integrity Goals: Patient/caregiver will verbalize understanding of skin care regimen Date Initiated: 01/07/2017 Target Resolution Date: 04/03/2017 Goal Status: Active Ulcer/skin breakdown will have a volume reduction of 30% by week 4 Date Initiated: 01/07/2017 Target Resolution Date: 04/03/2017 Goal Status: Active Ulcer/skin breakdown will have a volume reduction of 50% by week 8 Date Initiated: 01/07/2017 Target Resolution Date: 04/03/2017 Goal  Status: Active Ulcer/skin breakdown will have a volume reduction of 80% by week 12 Date Initiated: 01/07/2017 Target Resolution Date: 04/03/2017 Goal Status: Active Ulcer/skin breakdown will heal within 14 weeks Date Initiated: 01/07/2017 Target Resolution Date: 04/03/2017 Goal Status: Active Interventions: Assess patient/caregiver ability to obtain necessary supplies Assess patient/caregiver ability to perform ulcer/skin care regimen upon admission and as needed Assess ulceration(s) every visit Notes: Electronic Signature(s) LORIEN, SHINGLER ([id number]) Signed: 08/11/2017 4:47:02 PM By: Renne Crigler Entered By: Renne Crigler on 08/11/2017 10:22:09 [name] ([id number]) -------------------------------------------------------------------------------- Pain Assessment Details Patient Name: [name] Date of Service: 08/11/2017 10:15 AM Medical Record Number: [id number] Patient Account Number: [id number] Date of Birth/Sex: [birth year] (83 y.o. Female) Treating RN: Renne Crigler Primary Care Adiana Smelcer: Vonita Moss Other Clinician: Referring Robbin Loughmiller: Vonita Moss Treating Yaretzi Ernandez/Extender: Altamese [place] in Treatment: 30 Active Problems Location of Pain Severity and Description of Pain Patient Has Paino No Site Locations Pain Management and Medication Current Pain Management: Electronic Signature(s) Signed: 08/11/2017 4:47:02 PM By: Renne Crigler Entered By: Renne Crigler on 08/11/2017 10:05:09 [name] ([id number]) -------------------------------------------------------------------------------- Patient/Caregiver Education Details Patient Name: [name] Date of Service: 08/11/2017 10:15 AM Medical Record Number: [id number] Patient Account Number: [id number] Date of Birth/Gender: 31-May-1923 (83 y.o. Female) Treating RN: Renne Crigler Primary Care Physician: Vonita Moss Other Clinician: Referring Physician: Vonita Moss Treating Physician/Extender: Altamese [place] in Treatment: 30 Education Assessment Education Provided To: Patient Education Topics Provided Wound Debridement: Handouts: Wound Debridement Methods: Explain/Verbal Responses: State content correctly Wound/Skin Impairment: Handouts: Caring for Your Ulcer Methods: Explain/Verbal Responses: State content correctly Electronic Signature(s) Signed: 08/11/2017 4:47:02 PM By: Renne Crigler Entered By: Renne Crigler on 08/11/2017 10:48:27 [name] ([id number]) -------------------------------------------------------------------------------- Wound Assessment Details Patient Name: [name]. Date  of Service: 08/11/2017 10:15 AM Medical Record Number: [id number] Patient Account Number: [id number] Date of Birth/Sex: [birth year] (83 y.o. Female) Treating RN: Huel CoventryWoody, Kim Primary Care Lakia Gritton: Vonita MossRISSMAN, MARK Other Clinician: Referring Mathew Postiglione: Vonita MossRISSMAN, MARK Treating Callahan Wild/Extender: Altamese CarolinaOBSON, MICHAEL G Weeks in Treatment: 30 Wound Status Wound Number: 1 Primary Etiology: Pressure Ulcer Wound Location: Coccyx - Midline Wound Status: Open Wounding Event: Pressure Injury Comorbid History: Anemia, Hypertension, Dementia Date Acquired: 12/01/2016 Weeks Of Treatment: 30 Clustered Wound: No Photos Wound Measurements Length: (cm) 5.5 % Reduction Width: (cm) 5.7 % Reduction Depth: (cm) 0.6 Epithelializ Area: (cm) 24.622 Tunneling: Volume: (cm) 14.773 Undermining Location Starti Ending Maximu Location Starti Ending Maximu in Area: -492.6% in Volume: -788.9% ation: None No : Yes 1 ng Position (o'clock): 5 Position (o'clock): 12 m Distance: (cm) 1.2 2 ng Position (o'clock): 1 Position (o'clock): 5 m Distance: (cm) 3.2 Wound Description Classification: Category/Stage IV Foul Odor Af Wound Margin: Flat and Intact Due to Produ Exudate Amount: Large Slough/Fibri Exudate Type: Purulent Exudate  Color: yellow, brown, green ter Cleansing: Yes ct Use: No no Yes Wound Bed Granulation Amount: Medium (34-66%) Exposed Structure Granulation Quality: Red Fascia Exposed: No Necrotic Amount: Medium (34-66%) Fat Layer (Subcutaneous Tissue) Exposed: Yes Tina MainlandSHAW, Leinani W. ([id number]) Necrotic Quality: Eschar, Adherent Slough Tendon Exposed: No Muscle Exposed: Yes Necrosis of Muscle: No Joint Exposed: No Bone Exposed: No Periwound Skin Texture Texture Color No Abnormalities Noted: No No Abnormalities Noted: No Callus: No Atrophie Blanche: No Crepitus: No Cyanosis: No Excoriation: No Ecchymosis: No Induration: Yes Erythema: Yes Rash: No Erythema Location: Circumferential Scarring: No Hemosiderin Staining: No Mottled: No Moisture Pallor: No No Abnormalities Noted: No Rubor: No Dry / Scaly: No Maceration: No Temperature / Pain Temperature: No Abnormality Tenderness on Palpation: Yes Wound Preparation Ulcer Cleansing: Rinsed/Irrigated with Saline Topical Anesthetic Applied: Other: lidocaine 4%, Treatment Notes Wound #1 (Midline Coccyx) 1. Cleansed with: Clean wound with Normal Saline 2. Anesthetic Topical Lidocaine 4% cream to wound bed prior to debridement 3. Peri-wound Care: Skin Prep 4. Dressing Applied: Prisma Ag 5. Secondary Dressing Applied Bordered Foam Dressing Dry Gauze Electronic Signature(s) Signed: 08/12/2017 1:31:09 PM By: Elliot GurneyWoody, BSN, RN, CWS, Kim RN, BSN Signed: 08/14/2017 2:08:04 PM By: Renne CriglerFlinchum, Cheryl Previous Signature: 08/11/2017 5:09:04 PM Version By: Elliot GurneyWoody, BSN, RN, CWS, Kim RN, BSN Entered By: Renne CriglerFlinchum, Cheryl on 08/12/2017 07:58:36 Tina MainlandSHAW, Deniece W. ([id number]) -------------------------------------------------------------------------------- Wound Assessment Details Patient Name: Tina MainlandSHAW, Timarie W. Date of Service: 08/11/2017 10:15 AM Medical Record Number: [id number] Patient Account Number: [id number] Date of Birth/Sex: [birth year] (83  y.o. Female) Treating RN: Huel CoventryWoody, Kim Primary Care Elpidia Karn: Vonita MossRISSMAN, MARK Other Clinician: Referring Hetty Linhart: Vonita MossRISSMAN, MARK Treating Kamarius Buckbee/Extender: Altamese CarolinaOBSON, MICHAEL G Weeks in Treatment: 30 Wound Status Wound Number: 3 Primary Etiology: Fungal Wound Location: Left Gluteus Wound Status: Healed - Epithelialized Wounding Event: Blister Comorbid History: Anemia, Hypertension, Dementia Date Acquired: 07/26/2017 Weeks Of Treatment: 2 Clustered Wound: No Wound Measurements Length: (cm) 0 % Reduc Width: (cm) 0 % Reduc Depth: (cm) 0 Epithel Area: (cm) 0 Underm Volume: (cm) 0 Loc S E M Loca S E M tion in Area: 100% tion in Volume: 100% ialization: Medium (34-66%) ining: Yes ation 1 tarting Position (o'clock): 5 nding Position (o'clock): 11 aximum Distance: (cm) 0.8 tion 2 tarting Position (o'clock): 1 nding Position (o'clock): 5 aximum Distance: (cm) 3 Wound Description Full Thickness Without Exposed Support Foul Od Classification: Structures Slough/ Wound Margin: Flat and Intact Exudate Large Amount: Exudate Type: Serous Exudate Color: amber or After Cleansing: No Fibrino No Wound  Bed Granulation Amount: Large (67-100%) Exposed Structure Granulation Quality: Red Fascia Exposed: No Necrotic Amount: None Present (0%) Fat Layer (Subcutaneous Tissue) Exposed: Yes Tendon Exposed: No Muscle Exposed: No Joint Exposed: No Bone Exposed: No Periwound Skin Texture Texture Color No Abnormalities Noted: No No Abnormalities Noted: No Callus: No Atrophie Blanche: No ARTIE, TAKAYAMA ([id number]) Crepitus: No Cyanosis: No Excoriation: No Ecchymosis: No Induration: No Erythema: No Rash: No Hemosiderin Staining: No Scarring: No Mottled: No Pallor: No Moisture Rubor: No No Abnormalities Noted: No Dry / Scaly: No Temperature / Pain Maceration: No Temperature: No Abnormality Tenderness on Palpation: Yes Wound Preparation Ulcer Cleansing:  Rinsed/Irrigated with Saline Topical Anesthetic Applied: None Electronic Signature(s) Signed: 08/11/2017 5:09:04 PM By: Elliot Gurney, BSN, RN, CWS, Kim RN, BSN Entered By: Elliot Gurney, BSN, RN, CWS, Kim on 08/11/2017 10:25:30 [name] ([id number]) -------------------------------------------------------------------------------- Wound Assessment Details Patient Name: [name] Date of Service: 08/11/2017 10:15 AM Medical Record Number: [id number] Patient Account Number: [id number] Date of Birth/Sex: 03/27/1923 (83 y.o. Female) Treating RN: Renne Crigler Primary Care Shan Valdes: Vonita Moss Other Clinician: Referring Kamuela Magos: Vonita Moss Treating [place] Winslow/Extender: Altamese [place] in Treatment: 30 Wound Status Wound Number: 4 Primary Etiology: Skin Tear Wound Location: Right Lower Leg - Anterior Wound Status: Open Wounding Event: Trauma Comorbid History: Anemia, Hypertension, Dementia Date Acquired: 08/11/2017 Weeks Of Treatment: 0 Clustered Wound: Yes Photos Wound Measurements Length: (cm) 3 Width: (cm) 8.2 Depth: (cm) 0.1 Clustered Quantity: 2 Area: (cm) 19.321 Volume: (cm) 1.932 % Reduction in Area: 0% % Reduction in Volume: 0% Epithelialization: None Tunneling: No Undermining: No Wound Description Classification: Partial Thickness Wound Margin: Flat and Intact Exudate Amount: Medium Exudate Type: Sanguinous Exudate Color: red Foul Odor After Cleansing: No Slough/Fibrino No Wound Bed Granulation Amount: None Present (0%) Exposed Structure Necrotic Amount: Large (67-100%) Fascia Exposed: No Necrotic Quality: Eschar Fat Layer (Subcutaneous Tissue) Exposed: No Tendon Exposed: No Muscle Exposed: No Joint Exposed: No Bone Exposed: No Periwound Skin Texture Texture Color No Abnormalities Noted: No No Abnormalities Noted: No Callus: No Atrophie Blanche: No ELMIRE, AMREIN ([id number]) Crepitus: No Cyanosis: No Excoriation:  No Ecchymosis: No Induration: No Erythema: No Rash: No Hemosiderin Staining: No Scarring: No Mottled: No Pallor: No Moisture Rubor: No No Abnormalities Noted: No Dry / Scaly: No Maceration: No Wound Preparation Ulcer Cleansing: Rinsed/Irrigated with Saline Topical Anesthetic Applied: Other: lidocaine 4%, Treatment Notes Wound #4 (Right, Anterior Lower Leg) 1. Cleansed with: Clean wound with Normal Saline 2. Anesthetic Topical Lidocaine 4% cream to wound bed prior to debridement 3. Peri-wound Care: Skin Prep 4. Dressing Applied: Other dressing (specify in notes) 5. Secondary Dressing Applied Tegaderm Notes triple antibiotic cream to wound bed Electronic Signature(s) Signed: 08/14/2017 2:08:04 PM By: Renne Crigler Previous Signature: 08/11/2017 4:47:02 PM Version By: Renne Crigler Entered By: Renne Crigler on 08/12/2017 07:59:10 [name] ([id number]) -------------------------------------------------------------------------------- Vitals Details Patient Name: [name] Date of Service: 08/11/2017 10:15 AM Medical Record Number: [id number] Patient Account Number: [id number] Date of Birth/Sex: [birth year] (83 y.o. Female) Treating RN: Renne Crigler Primary Care Leny Morozov: Vonita Moss Other Clinician: Referring Daimien Patmon: Vonita Moss Treating Tashiana Lamarca/Extender: Altamese [place] in Treatment: 30 Vital Signs Time Taken: 10:05 Temperature (F): 98.1 Height (in): 60 Pulse (bpm): 84 Source: Stated Respiratory Rate (breaths/min): 18 Weight (lbs): 100 Blood Pressure (mmHg): 120/98 Source: Stated Reference Range: 80 - 120 mg / dl Body Mass Index (BMI): 19.5 Electronic Signature(s) Signed: 08/11/2017 4:47:02 PM By: Renne Crigler Entered By: Renne Crigler on 08/11/2017 10:06:07

## 2017-08-16 NOTE — Progress Notes (Addendum)
[name], [name] ([id number]) Visit Report for 08/11/2017 Debridement Details Patient Name: [name], [name] Date of Service: 08/11/2017 10:15 AM Medical Record Number: [id number] Patient Account Number: [id number] Date of Birth/Sex: 1922/11/29 (83 y.o. Female) Treating RN: Curtis Sites Primary Care Provider: Vonita Moss Other Clinician: Referring Provider: Vonita Moss Treating Provider/Extender: Altamese [place] in Treatment: 30 Debridement Performed for Wound #4 Right,Anterior Lower Leg Assessment: Performed By: Physician Maxwell Caul, MD Debridement: Open Wound/Selective Debridement Description: Selective Pre-procedure Verification/Time Yes - 10:32 Out Taken: Start Time: 10:33 Pain Control: Other : lidocaine 4% Level: Non-Viable Tissue Total Area Debrided (L x W): 1 (cm) x 0.5 (cm) = 0.5 (cm) Tissue and other material Non-Viable, Skin debrided: Instrument: Scissors Bleeding: Minimum Hemostasis Achieved: Pressure End Time: 10:34 Procedural Pain: 0 Post Procedural Pain: 0 Response to Treatment: Procedure was tolerated well Post Debridement Measurements of Total Wound Length: (cm) 3 Width: (cm) 8.2 Depth: (cm) 0.1 Volume: (cm) 1.932 Character of Wound/Ulcer Post Debridement: Stable Post Procedure Diagnosis Same as Pre-procedure Electronic Signature(s) Signed: 08/12/2017 4:36:42 PM By: Baltazar Najjar MD Signed: 08/14/2017 5:02:13 PM By: Curtis Sites Entered By: Baltazar Najjar on 08/11/2017 10:54:30 [name] ([id number]) -------------------------------------------------------------------------------- HPI Details Patient Name: [name] Date of Service: 08/11/2017 10:15 AM Medical Record Number: [id number] Patient Account Number: [id number] Date of Birth/Sex: 09-29-1923 (83 y.o. Female) Treating RN: Curtis Sites Primary Care Provider: Vonita Moss Other Clinician: Referring Provider: Vonita Moss Treating  Provider/Extender: Altamese [place] in Treatment: 30 History of Present Illness HPI Description: 01/07/17 this is a 83 year old woman admitted to the clinic today for review of a pressure ulcer on her lower sacrum. She is referred from her primary physician's office after being seen on 3/22 with a 3 cm pressure area. Her daughter and caretaker accompanied her today state that the area first became obvious about a month ago and his since deteriorated. They have recently got [name] a home health involved and have been using [name] to the wound. They have ordered a pressure relief surface for her mattress. They are turning her religiously. They state that she eats well and they've been forcing fluids on her. She is on a multivitamin. Looking through [facility] point last albumin I see was 4.4 on 10/28. The patient has advanced parkinsonism which looks superficially like advanced Parkinson's disease although her daughter tells me she did not ever respond to Sinemet therefore this may have another pathology with signs of parkinsonism. However I think this is largely a mute point currently. She also has dementia and is nonambulatory. Since this started they have been keeping her in bed and turning her religiously every 2 hours. She lives at home in [place] with her husband with 24/7 care giving 01/13/17 santyl change qd. Still will require further debridement. continue santyl. 01/20/17; patient's wound actually looks some better less adherent necrotic surface. There is actually visible granulation. We're using [name] 01/27/17; better-looking surface but still a lot of necrotic tissue on the base of this wound. The periwound erythema is better than last week we are still using [name]. Her daughter tells [place] that she is still having trouble with the pressure-relief mattress through medical modalities 02/03/18; I had the patient scheduled for a two week followup however her daughter brought her in  early concerned for discoloration on 2 areas of the wound circumference. We have bee using santyl 02/10/17;Better looking surface to the wound. Rim appears better suggesting better offloading. Using santyl 02/24/17; change to Silver Collegen last  time. Wound appears better. 03/10/17; still using silver collagen religious offloading. Intake is satisfactory per her daughter. Dimension slightly better 03/12/2017 -- Dr. Jannetta Quint patient who had been seen 2 days ago and was doing fairly well. The patient is brought in by her daughter who noticed a new wound just above the previous wound on her sacral area and going on more to the left lateral side. She was very concerned and we asked her to get in for an opinion. 03/17/17; above is noted. The patient has developed a progressive area to the left of her original wound. This seems to this started with a ring of red skin with a more pale interior almost looking fungal. There was a rim of blister through part of the area although this did not look like zoster. They have been applying triamcinolone that was prescribed last week by Dr. Meyer Russel and the area has a fold to a linear band area which is confluent, erythematous and with obvious epidermal swelling but there is no overt tenderness or crepitus. She has lost some surface epithelium closer to the wound surface itself and now has a more superficial wound in this area and the extending erythema goes towards the left buttock. This is well demarcated between involved in normal skin but once again does not appear to be at all tender. If there is a contact issue here I cannot get the history out of the daughter or the caregiver that are with her. 03/24/17; the patient arrives today with the wound slightly worse slightly more drainage. The bandlike area of erythema that I treated as a possible pineal infection has improved somewhat although proximally is still has confluent erythema without overt tenderness. We have  been using silver alginate since the most recent deterioration. To the bandlike degree of erythema we have been using Lotrisone cream 03/31/17; patient arrives today with the wound slightly larger, necrotic surface and surrounding erythema. The bandlike area of erythema that I treated as a possible pineal infection is less swollen but still present I've been using Lotrisone cream on that largely related to the presence of a tinea looking infection when this was first seen. We've been using silver alginate. X-ray that I ordered last week showed no acute bony abnormalities mild fecal impaction. Lab work showed a comprehensive metabolic panel that was normal including an albumin of 3.8. White count was 9.5 hemoglobin 12.3 differential count normal. C-reactive protein was less than 1 and sedimentation rate and 17. The latter 2 values does not support an ongoing bacterial infection. 04/14/17; patient arrives after a 2 week hiatus. Her wound is not in good condition. Although the base of the wound looks CAYLA, WIEGAND. ([id number]) stable she still has an erythematous area that was apparently blistered over the weekend. This again points to the left. As our intake nurse pointed out today this is in the area where the tissue folds together and we may need to prevent try to prevent this. Lab work and x-ray that I did to 3 weeks ago were unremarkable including her albumin nevertheless she is an extremely frail condition physically. We have have been using [name] 04/22/17; I changed her to silver alginate because of the surrounding maceration and moisture last week. The daughter did not like the way the wound looked in the middle of the week and changed her back to [place]. They're putting gauze on top of this. Thinks still using Lotrisone. 05/06/17 on evaluation today patient sacral wound appears to be doing okay and  does not seem to be any worse. She is having no significant pain during evaluation today the  secondary to mental status she is unable to rate or describe whether she had any pain she was not however flinching. Her daughter states that the wound does appear to be looking better to her. Still we are having difficulty with the skinfold that seems to be closing in on itself at this point. All in all I feel like she is making some good progress in the [name] seems to be the official for her. They do tell me that a refill if we are gonna continue that today. No fevers, chills, nausea, or vomiting noted at this time. 05/13/17 presents today for evaluation concerning her ongoing sacral pressure ulcer. Unfortunately she also has an area of deep tissue injury in the right Ischial region which is starting to show up. The sacral wound also continues to show signs of necrotic tissue overlying and has declined. Overall we really have not seen a significant improvement in the past several months in regard to the sacral wound and now patient is starting to develop a new wound in the right Ischial region. Obviously this is not trending in the direction that we want to see. No fevers, chills, nausea, or vomiting noted at this time. 05/19/17; I have not seen this wound and almost a month however there is nothing really positive to say about it. Necrotic tissue over the surface which superiorly I think abuts on her sacrum. She has surrounding erythema. I would be surprised if there is not underlying osteomyelitis or soft tissue infection. 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 She apparently eats well per description although I wonder about this looking at her. Lab work I did probably 4 weeks ago however was really quite normal including a serum albumin 05/26/17; culture I did last week grew Escherichia coli and methicillin sensitive staph aureus which should've been well covered by the Augmentin and ciprofloxacin. Indeed the erythema around the wound in the bed of the wound looks somewhat  better. Her intake is still satisfactory. They now have a near fluidized bed 06/02/17; they completed the antibiotics last Friday. Using collagen. Daughter still reports eating and drinking well. There is less visualized erythema around the wound. 06/16/17; large pressure ulcer over her lower sacrum and coccyx. Using [name] to the wound bed. 06/30/17; certainly no change in dimensions of this large stage III wound over her sacrum and coccyx. They've been using [name]. There is no exposed bone. She has a candidal/tinea area in the right inguinal area. Other than that her daughter relates that she is eating and drinking well there changing her positioning to make sure the areas offloaded 07/14/17; no major change in the dimensions of this large stage III wound. Initially a smaller wound that became secondarily infected causing significant tissue breakdown although it is been stable in the last several weeks. Tunneling superiorly at roughly 1:00 no change here either. There is no bone palpable. Both the patient's daughter and caretaker states that she eats well. I have not rechecked her blood work 07/27/17; patient arrives in clinic today and generally a deteriorated looking state. Mild fever with axillary temperature of 100.4. Daughter reports she has not been eating and drinking well since yesterday. She looks more pale and thin and less responsive. We have been using silver collagen to her wound 08/11/17; since the last time the patient was here things have gone better. Her fever went down and  she started eating and drinking again. Culture I did of the wound showed methicillin sensitive staph aureus, Morganella and enterococcus. I only treated her with Keflex which would've not covered the Morganella and enterococcus however the purulent area on the 2:00 side of her wound is a lot better and the bandlike erythema that concern me also was resolved. I'd called the daughter last week to confirm that she  was a lot better. She finished the Keflex last Thursday she also suffered a skin tear this morning perhaps while putting on her incontinence brief period is on the lateral aspect of her right leg. Clean wound with the surface epithelium not viable. Electronic Signature(s) Signed: 08/12/2017 4:36:42 PM By: Baltazar Najjarobson,  MD Entered By: Baltazar Najjarobson,  on 08/11/2017 10:58:23 [name], [name] ([id number]) -------------------------------------------------------------------------------- Physical Exam Details Patient Name: [name], [name] Date of Service: 08/11/2017 10:15 AM Medical Record Number: [id number] Patient Account Number: [id number] Date of Birth/Sex: Oct 07, 1922 (83 y.o. Female) Treating RN: Curtis Sitesorthy, Joanna Primary Care Provider: Vonita MossRISSMAN, MARK Other Clinician: Referring Provider: Vonita MossRISSMAN, MARK Treating Provider/Extender: Altamese CarolinaOBSON,  G Weeks in Treatment: 30 Notes Wound exam; for extensive wound over the lower sacrum and coccyx actually looks better in terms of the infection. Knee and purulent drainage that was coming out of the right superior part of this at 2:00 seems stable. The bandlike erythema over the left by doc is also resolved. She is extensive tunneling and undermining from roughly 1 to 5:00. I could not feel any exposed bone oNew today was a laceration on the right lateral calf. Healthy looking granulation tissue. Denuded skin from the surface of this removed with pickups and scissors. There is no evidence of infection Electronic Signature(s) Signed: 08/12/2017 4:36:42 PM By: Baltazar Najjarobson,  MD Entered By: Baltazar Najjarobson,  on 08/11/2017 11:01:58 [name], [name] ([id number]) -------------------------------------------------------------------------------- Physician Orders Details Patient Name: [name], [name] Date of Service: 08/11/2017 10:15 AM Medical Record Number: [id number] Patient Account Number: [id number] Date of Birth/Sex: Oct 07, 1922 (83 y.o.  Female) Treating RN: Renne CriglerFlinchum, Cheryl Primary Care Provider: Vonita MossRISSMAN, MARK Other Clinician: Referring Provider: Vonita MossRISSMAN, MARK Treating Provider/Extender: Altamese CarolinaOBSON,  G Weeks in Treatment: 30 Verbal / Phone Orders: No Diagnosis Coding Wound Cleansing Wound #1 Midline Coccyx o Clean wound with Normal Saline. o May Shower, gently pat wound dry prior to applying new dressing. Wound #4 Right,Anterior Lower Leg o Clean wound with Normal Saline. o May Shower, gently pat wound dry prior to applying new dressing. Anesthetic Wound #1 Midline Coccyx o Topical Lidocaine 4% cream applied to wound bed prior to debridement - in clinic Wound #4 Right,Anterior Lower Leg o Topical Lidocaine 4% cream applied to wound bed prior to debridement - in clinic Skin Barriers/Peri-Wound Care Wound #1 Midline Coccyx o Skin Prep Primary Wound Dressing Wound #1 Midline Coccyx o Prisma Ag - pack into undermining Wound #4 Right,Anterior Lower Leg o Other: - triple antibiotic cream to wound bed Secondary Dressing Wound #1 Midline Coccyx o Boardered Foam Dressing Wound #4 Right,Anterior Lower Leg o Tegaderm Dressing Change Frequency Wound #1 Midline Coccyx o Change dressing every day. Wound #4 Right,Anterior Lower Leg o Change dressing every day. Follow-up Appointments [name], [name] ([id number]) Wound #1 Midline Coccyx o Return Appointment in 2 weeks. Wound #4 Right,Anterior Lower Leg o Return Appointment in 2 weeks. Off-Loading Wound #1 Midline Coccyx o Roho cushion for wheelchair - HHRN please order roho cushion or gel cushion for patients wheelchair - whichever she qualifies for o Turn and reposition every 2 hours Additional Orders / Instructions  Wound #1 Midline Coccyx o Increase protein intake. - please add protein supplements to patients diet o Other: - please add vitamin A, vitamin C and zinc supplements to patients diet Wound #4 Right,Anterior  Lower Leg o Increase protein intake. - please add protein supplements to patients diet o Other: - please add vitamin A, vitamin C and zinc supplements to patients diet Home Health Wound #1 Midline Coccyx o Continue Home Health Visits - [facility] o Home Health Nurse may visit PRN to address patientos wound care needs. o FACE TO FACE ENCOUNTER: MEDICARE and MEDICAID PATIENTS: I certify that this patient is under my care and that I had a face-to-face encounter that meets the physician face-to-face encounter requirements with this patient on this date. The encounter with the patient was in whole or in part for the following MEDICAL CONDITION: (primary reason for Home Healthcare) MEDICAL NECESSITY: I certify, that based on my findings, NURSING services are a medically necessary home health service. HOME BOUND STATUS: I certify that my clinical findings support that this patient is homebound (i.e., Due to illness or injury, pt requires aid of supportive devices such as crutches, cane, wheelchairs, walkers, the use of special transportation or the assistance of another person to leave their place of residence. There is a normal inability to leave the home and doing so requires considerable and taxing effort. Other absences are for medical reasons / religious services and are infrequent or of short duration when for other reasons). o If current dressing causes regression in wound condition, may D/C ordered dressing product/s and apply Normal Saline Moist Dressing daily until next Wound Healing Center / Other MD appointment. Notify Wound Healing Center of regression in wound condition at [phone]. o Please direct any NON-WOUND related issues/requests for orders to patient's Primary Care Physician - Dr Vonita Moss Wound #4 Right,Anterior Lower Leg o Continue Home Health Visits - [facility] o Home Health Nurse may visit PRN to address patientos wound care needs. o FACE TO FACE  ENCOUNTER: MEDICARE and MEDICAID PATIENTS: I certify that this patient is under my care and that I had a face-to-face encounter that meets the physician face-to-face encounter requirements with this patient on this date. The encounter with the patient was in whole or in part for the following MEDICAL CONDITION: (primary reason for Home Healthcare) MEDICAL NECESSITY: I certify, that based on my findings, NURSING services are a medically necessary home health service. HOME BOUND STATUS: I certify that my clinical findings support that this patient is homebound (i.e., Due to illness or injury, pt requires aid of supportive devices such as crutches, cane, wheelchairs, walkers, the use of special transportation or the assistance of another person to leave their place of residence. There is a normal inability to leave the home and doing so requires considerable and taxing effort. Other absences are for medical reasons / religious services and are infrequent or of short duration when for other reasons). o If current dressing causes regression in wound condition, may D/C ordered dressing product/s and apply Normal Saline Moist Dressing daily until next Wound Healing Center / Other MD appointment. Notify Wound Healing Center of regression in wound condition at [phone]. ICELYNN, ONKEN ([id number]) o Please direct any NON-WOUND related issues/requests for orders to patient's Primary Care Physician - Dr Vonita Moss Electronic Signature(s) Signed: 08/11/2017 4:47:02 PM By: Renne Crigler Signed: 08/12/2017 4:36:42 PM By: Baltazar Najjar MD Entered By: Renne Crigler on 08/11/2017 10:45:16 [name] ([id number]) -------------------------------------------------------------------------------- Problem List Details Patient Name:  [name] Date of Service: 08/11/2017 10:15 AM Medical Record Number: [id number] Patient Account Number: [id number] Date of Birth/Sex: 11-02-1922 (83  y.o. Female) Treating RN: Curtis Sites Primary Care Provider: Vonita Moss Other Clinician: Referring Provider: Vonita Moss Treating Provider/Extender: Altamese [place] in Treatment: 30 Active Problems ICD-10 Encounter Code Description Active Date Diagnosis L89.153 Pressure ulcer of sacral region, stage 3 01/07/2017 Yes G20 Parkinson's disease 01/07/2017 Yes L03.312 Cellulitis of back [any part except buttock] 07/28/2017 Yes S81.811D Laceration without foreign body, right lower leg, subsequent 08/11/2017 Yes encounter Inactive Problems Resolved Problems Electronic Signature(s) Signed: 08/12/2017 4:36:42 PM By: Baltazar Najjar MD Entered By: Baltazar Najjar on 08/11/2017 10:54:01 [name] ([id number]) -------------------------------------------------------------------------------- Progress Note Details Patient Name: [name] Date of Service: 08/11/2017 10:15 AM Medical Record Number: [id number] Patient Account Number: [id number] Date of Birth/Sex: Aug 30, 1923 (83 y.o. Female) Treating RN: Curtis Sites Primary Care Provider: Vonita Moss Other Clinician: Referring Provider: Vonita Moss Treating Provider/Extender: Altamese [place] in Treatment: 30 Subjective History of Present Illness (HPI) 01/07/17 this is a 83 year old woman admitted to the clinic today for review of a pressure ulcer on her lower sacrum. She is referred from her primary physician's office after being seen on 3/22 with a 3 cm pressure area. Her daughter and caretaker accompanied her today state that the area first became obvious about a month ago and his since deteriorated. They have recently got [name] a home health involved and have been using [name] to the wound. They have ordered a pressure relief surface for her mattress. They are turning her religiously. They state that she eats well and they've been forcing fluids on her. She is on a multivitamin. Looking through  [facility] point last albumin I see was 4.4 on 10/28. The patient has advanced parkinsonism which looks superficially like advanced Parkinson's disease although her daughter tells me she did not ever respond to Sinemet therefore this may have another pathology with signs of parkinsonism. However I think this is largely a mute point currently. She also has dementia and is nonambulatory. Since this started they have been keeping her in bed and turning her religiously every 2 hours. She lives at home in [place] with her husband with 24/7 care giving 01/13/17 santyl change qd. Still will require further debridement. continue santyl. 01/20/17; patient's wound actually looks some better less adherent necrotic surface. There is actually visible granulation. We're using [name] 01/27/17; better-looking surface but still a lot of necrotic tissue on the base of this wound. The periwound erythema is better than last week we are still using [name]. Her daughter tells [place] that she is still having trouble with the pressure-relief mattress through medical modalities 02/03/18; I had the patient scheduled for a two week followup however her daughter brought her in early concerned for discoloration on 2 areas of the wound circumference. We have bee using santyl 02/10/17;Better looking surface to the wound. Rim appears better suggesting better offloading. Using santyl 02/24/17; change to Silver Collegen last time. Wound appears better. 03/10/17; still using silver collagen religious offloading. Intake is satisfactory per her daughter. Dimension slightly better 03/12/2017 -- Dr. Jannetta Quint patient who had been seen 2 days ago and was doing fairly well. The patient is brought in by her daughter who noticed a new wound just above the previous wound on her sacral area and going on more to the left lateral side. She was very concerned and we asked her to get in for an opinion. 03/17/17; above  is noted. The patient has developed a  progressive area to the left of her original wound. This seems to this started with a ring of red skin with a more pale interior almost looking fungal. There was a rim of blister through part of the area although this did not look like zoster. They have been applying triamcinolone that was prescribed last week by Dr. Meyer Russel and the area has a fold to a linear band area which is confluent, erythematous and with obvious epidermal swelling but there is no overt tenderness or crepitus. She has lost some surface epithelium closer to the wound surface itself and now has a more superficial wound in this area and the extending erythema goes towards the left buttock. This is well demarcated between involved in normal skin but once again does not appear to be at all tender. If there is a contact issue here I cannot get the history out of the daughter or the caregiver that are with her. 03/24/17; the patient arrives today with the wound slightly worse slightly more drainage. The bandlike area of erythema that I treated as a possible pineal infection has improved somewhat although proximally is still has confluent erythema without overt tenderness. We have been using silver alginate since the most recent deterioration. To the bandlike degree of erythema we have been using Lotrisone cream 03/31/17; patient arrives today with the wound slightly larger, necrotic surface and surrounding erythema. The bandlike area of erythema that I treated as a possible pineal infection is less swollen but still present I've been using Lotrisone cream on that largely related to the presence of a tinea looking infection when this was first seen. We've been using silver alginate. X-ray that I ordered last week showed no acute bony abnormalities mild fecal impaction. Lab work showed a comprehensive metabolic panel that was normal including an albumin of 3.8. White count was 9.5 hemoglobin 12.3 differential count normal. C-reactive  protein was less than 1 and sedimentation rate and 17. The latter 2 values does not support an ongoing bacterial infection. ASIYAH, PINEAU ([id number]) 04/14/17; patient arrives after a 2 week hiatus. Her wound is not in good condition. Although the base of the wound looks stable she still has an erythematous area that was apparently blistered over the weekend. This again points to the left. As our intake nurse pointed out today this is in the area where the tissue folds together and we may need to prevent try to prevent this. Lab work and x-ray that I did to 3 weeks ago were unremarkable including her albumin nevertheless she is an extremely frail condition physically. We have have been using [name] 04/22/17; I changed her to silver alginate because of the surrounding maceration and moisture last week. The daughter did not like the way the wound looked in the middle of the week and changed her back to Sarita. They're putting gauze on top of this. Thinks still using Lotrisone. 05/06/17 on evaluation today patient sacral wound appears to be doing okay and does not seem to be any worse. She is having no significant pain during evaluation today the secondary to mental status she is unable to rate or describe whether she had any pain she was not however flinching. Her daughter states that the wound does appear to be looking better to her. Still we are having difficulty with the skinfold that seems to be closing in on itself at this point. All in all I feel like she is making some good progress  in the [name] seems to be the official for her. They do tell me that a refill if we are gonna continue that today. No fevers, chills, nausea, or vomiting noted at this time. 05/13/17 presents today for evaluation concerning her ongoing sacral pressure ulcer. Unfortunately she also has an area of deep tissue injury in the right Ischial region which is starting to show up. The sacral wound also continues to show signs  of necrotic tissue overlying and has declined. Overall we really have not seen a significant improvement in the past several months in regard to the sacral wound and now patient is starting to develop a new wound in the right Ischial region. Obviously this is not trending in the direction that we want to see. No fevers, chills, nausea, or vomiting noted at this time. 05/19/17; I have not seen this wound and almost a month however there is nothing really positive to say about it. Necrotic tissue over the surface which superiorly I think abuts on her sacrum. She has surrounding erythema. I would be surprised if there is not underlying osteomyelitis or soft tissue infection. 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 She apparently eats well per description although I wonder about this looking at her. Lab work I did probably 4 weeks ago however was really quite normal including a serum albumin 05/26/17; culture I did last week grew Escherichia coli and methicillin sensitive staph aureus which should've been well covered by the Augmentin and ciprofloxacin. Indeed the erythema around the wound in the bed of the wound looks somewhat better. Her intake is still satisfactory. They now have a near fluidized bed 06/02/17; they completed the antibiotics last Friday. Using collagen. Daughter still reports eating and drinking well. There is less visualized erythema around the wound. 06/16/17; large pressure ulcer over her lower sacrum and coccyx. Using [name] to the wound bed. 06/30/17; certainly no change in dimensions of this large stage III wound over her sacrum and coccyx. They've been using [name]. There is no exposed bone. She has a candidal/tinea area in the right inguinal area. Other than that her daughter relates that she is eating and drinking well there changing her positioning to make sure the areas offloaded 07/14/17; no major change in the dimensions of this large stage III wound. Initially a  smaller wound that became secondarily infected causing significant tissue breakdown although it is been stable in the last several weeks. Tunneling superiorly at roughly 1:00 no change here either. There is no bone palpable. Both the patient's daughter and caretaker states that she eats well. I have not rechecked her blood work 07/27/17; patient arrives in clinic today and generally a deteriorated looking state. Mild fever with axillary temperature of 100.4. Daughter reports she has not been eating and drinking well since yesterday. She looks more pale and thin and less responsive. We have been using silver collagen to her wound 08/11/17; since the last time the patient was here things have gone better. Her fever went down and she started eating and drinking again. Culture I did of the wound showed methicillin sensitive staph aureus, Morganella and enterococcus. I only treated her with Keflex which would've not covered the Morganella and enterococcus however the purulent area on the 2:00 side of her wound is a lot better and the bandlike erythema that concern me also was resolved. I'd called the daughter last week to confirm that she was a lot better. She finished the Keflex last Thursday she also suffered a  skin tear this morning perhaps while putting on her incontinence brief period is on the lateral aspect of her right leg. Clean wound with the surface epithelium not viable. Objective Tina, [name] ([id number]) Constitutional Vitals Time Taken: 10:05 AM, Height: 60 in, Source: Stated, Weight: 100 lbs, Source: Stated, BMI: 19.5, Temperature: 98.1  F, Pulse: 84 bpm, Respiratory Rate: 18 breaths/min, Blood Pressure: 120/98 mmHg. Integumentary (Hair, Skin) Wound #1 status is Open. Original cause of wound was Pressure Injury. The wound is located on the Midline Coccyx. The wound measures 5.5cm length x 5.7cm width x 0.6cm depth; 24.622cm^2 area and 14.773cm^3 volume. There is muscle and Fat  Layer (Subcutaneous Tissue) Exposed exposed. There is no tunneling noted, however, there is undermining starting at 5:00 and ending at 12:00 with a maximum distance of 1.2cm. There is additional undermining and at 1:00 and ending at 5:00 with a maximum distance of 3.2cm. There is a large amount of purulent drainage noted. Foul odor after cleansing was noted. The wound margin is flat and intact. There is medium (34-66%) red granulation within the wound bed. There is a medium (34- 66%) amount of necrotic tissue within the wound bed including Eschar and Adherent Slough. The periwound skin appearance exhibited: Induration, Erythema. The periwound skin appearance did not exhibit: Callus, Crepitus, Excoriation, Rash, Scarring, Dry/Scaly, Maceration, Atrophie Blanche, Cyanosis, Ecchymosis, Hemosiderin Staining, Mottled, Pallor, Rubor. The surrounding wound skin color is noted with erythema which is circumferential. Periwound temperature was noted as No Abnormality. The periwound has tenderness on palpation. Wound #3 status is Healed - Epithelialized. Original cause of wound was Blister. The wound is located on the Left Gluteus. The wound measures 0cm length x 0cm width x 0cm depth; 0cm^2 area and 0cm^3 volume. There is Fat Layer (Subcutaneous Tissue) Exposed exposed. There is undermining starting at 5:00 and ending at 11:00 with a maximum distance of 0.8cm. There is additional undermining and at 1:00 and ending at 5:00 with a maximum distance of 3cm. There is a large amount of serous drainage noted. The wound margin is flat and intact. There is large (67-100%) red granulation within the wound bed. There is no necrotic tissue within the wound bed. The periwound skin appearance did not exhibit: Callus, Crepitus, Excoriation, Induration, Rash, Scarring, Dry/Scaly, Maceration, Atrophie Blanche, Cyanosis, Ecchymosis, Hemosiderin Staining, Mottled, Pallor, Rubor, Erythema. Periwound temperature was noted as No  Abnormality. The periwound has tenderness on palpation. Wound #4 status is Open. Original cause of wound was Trauma. The wound is located on the Right,Anterior Lower Leg. The wound measures 3cm length x 8.2cm width x 0.1cm depth; 19.321cm^2 area and 1.932cm^3 volume. There is no tunneling or undermining noted. There is a medium amount of sanguinous drainage noted. The wound margin is flat and intact. There is no granulation within the wound bed. There is a large (67-100%) amount of necrotic tissue within the wound bed including Eschar. The periwound skin appearance did not exhibit: Callus, Crepitus, Excoriation, Induration, Rash, Scarring, Dry/Scaly, Maceration, Atrophie Blanche, Cyanosis, Ecchymosis, Hemosiderin Staining, Mottled, Pallor, Rubor, Erythema. Assessment Active Problems ICD-10 L89.153 - Pressure ulcer of sacral region, stage 3 G20 - Parkinson's disease L03.312 - Cellulitis of back [any part except buttock] S81.811D - Laceration without foreign body, right lower leg, subsequent encounter Procedures Wound #4 Pre-procedure diagnosis of Wound #4 is a Skin Tear located on the Right,Anterior Lower Leg . There was a Non-Viable FRANCHELLE, FOSKETT. ([id number]) Tissue Open Wound/Selective [phone]) debridement with total area of 0.5 sq cm performed by ,   G, MD. with the following instrument(s): Scissors to remove Non-Viable tissue/material including Skin after achieving pain control using Other (lidocaine 4%). A time out was conducted at 10:32, prior to the start of the procedure. A Minimum amount of bleeding was controlled with Pressure. The procedure was tolerated well with a pain level of 0 throughout and a pain level of 0 following the procedure. Post Debridement Measurements: 3cm length x 8.2cm width x 0.1cm depth; 1.932cm^3 volume. Character of Wound/Ulcer Post Debridement is stable. Post procedure Diagnosis Wound #4: Same as Pre-Procedure Plan Wound  Cleansing: Wound #1 Midline Coccyx: Clean wound with Normal Saline. May Shower, gently pat wound dry prior to applying new dressing. Wound #4 Right,Anterior Lower Leg: Clean wound with Normal Saline. May Shower, gently pat wound dry prior to applying new dressing. Anesthetic: Wound #1 Midline Coccyx: Topical Lidocaine 4% cream applied to wound bed prior to debridement - in clinic Wound #4 Right,Anterior Lower Leg: Topical Lidocaine 4% cream applied to wound bed prior to debridement - in clinic Skin Barriers/Peri-Wound Care: Wound #1 Midline Coccyx: Skin Prep Primary Wound Dressing: Wound #1 Midline Coccyx: Prisma Ag - pack into undermining Wound #4 Right,Anterior Lower Leg: Other: - triple antibiotic cream to wound bed Secondary Dressing: Wound #1 Midline Coccyx: Boardered Foam Dressing Wound #4 Right,Anterior Lower Leg: Tegaderm Dressing Change Frequency: Wound #1 Midline Coccyx: Change dressing every day. Wound #4 Right,Anterior Lower Leg: Change dressing every day. Follow-up Appointments: Wound #1 Midline Coccyx: Return Appointment in 2 weeks. Wound #4 Right,Anterior Lower Leg: Return Appointment in 2 weeks. Off-Loading: Wound #1 Midline Coccyx: Roho cushion for wheelchair - HHRN please order roho cushion or gel cushion for patients wheelchair - whichever she qualifies for Turn and reposition every 2 hours Additional Orders / Instructions: Wound #1 Midline Coccyx: Increase protein intake. - please add protein supplements to patients diet Other: - please add vitamin A, vitamin C and zinc supplements to patients diet SUZY, KUGEL. ([id number]) Wound #4 Right,Anterior Lower Leg: Increase protein intake. - please add protein supplements to patients diet Other: - please add vitamin A, vitamin C and zinc supplements to patients diet Home Health: Wound #1 Midline Coccyx: Continue Home Health Visits - [facility] Nurse may visit PRN to address patient s wound  care needs. FACE TO FACE ENCOUNTER: MEDICARE and MEDICAID PATIENTS: I certify that this patient is under my care and that I had a face-to-face encounter that meets the physician face-to-face encounter requirements with this patient on this date. The encounter with the patient was in whole or in part for the following MEDICAL CONDITION: (primary reason for Home Healthcare) MEDICAL NECESSITY: I certify, that based on my findings, NURSING services are a medically necessary home health service. HOME BOUND STATUS: I certify that my clinical findings support that this patient is homebound (i.e., Due to illness or injury, pt requires aid of supportive devices such as crutches, cane, wheelchairs, walkers, the use of special transportation or the assistance of another person to leave their place of residence. There is a normal inability to leave the home and doing so requires considerable and taxing effort. Other absences are for medical reasons / religious services and are infrequent or of short duration when for other reasons). If current dressing causes regression in wound condition, may D/C ordered dressing product/s and apply Normal Saline Moist Dressing daily until next Wound Healing Center / Other MD appointment. Notify Wound Healing Center of regression in wound condition at [phone]. Please direct any NON-WOUND  related issues/requests for orders to patient's Primary Care Physician - Dr Vonita Moss Wound #4 Right,Anterior Lower Leg: Continue Home Health Visits - [facility] Nurse may visit PRN to address patient s wound care needs. FACE TO FACE ENCOUNTER: MEDICARE and MEDICAID PATIENTS: I certify that this patient is under my care and that I had a face-to-face encounter that meets the physician face-to-face encounter requirements with this patient on this date. The encounter with the patient was in whole or in part for the following MEDICAL CONDITION: (primary reason for  Home Healthcare) MEDICAL NECESSITY: I certify, that based on my findings, NURSING services are a medically necessary home health service. HOME BOUND STATUS: I certify that my clinical findings support that this patient is homebound (i.e., Due to illness or injury, pt requires aid of supportive devices such as crutches, cane, wheelchairs, walkers, the use of special transportation or the assistance of another person to leave their place of residence. There is a normal inability to leave the home and doing so requires considerable and taxing effort. Other absences are for medical reasons / religious services and are infrequent or of short duration when for other reasons). If current dressing causes regression in wound condition, may D/C ordered dressing product/s and apply Normal Saline Moist Dressing daily until next Wound Healing Center / Other MD appointment. Notify Wound Healing Center of regression in wound condition at [phone]. Please direct any NON-WOUND related issues/requests for orders to patient's Primary Care Physician - Dr Vonita Moss #1 we will continue with Prisma base dressings including the undermined area from 1 to 5:00. #2 no further antibiotics are indicated at this point, I don't think that the Morganella and enterococcus represented true infection whereas the MSSA mind of #3 we use triple antibiotic cream to the skin tear on the right leg covered with border foam. Electronic Signature(s) Signed: 08/17/2017 3:29:35 PM By: Elliot Gurney, BSN, RN, CWS, Kim RN, BSN Signed: 08/19/2017 8:07:00 AM By: Baltazar Najjar MD Previous Signature: 08/11/2017 11:03:01 AM Version By: Baltazar Najjar MD Entered By: Elliot Gurney, BSN, RN, CWS, Kim on 08/17/2017 15:29:35 [name] ([id number]) -------------------------------------------------------------------------------- SuperBill Details Patient Name: [name] Date of Service: 08/11/2017 Medical Record Number: [id number] Patient  Account Number: [id number] Date of Birth/Sex: Jun 24, 1923 (83 y.o. Female) Treating RN: Curtis Sites Primary Care Provider: Vonita Moss Other Clinician: Referring Provider: Vonita Moss Treating Provider/Extender: Altamese [place] in Treatment: 30 Diagnosis Coding ICD-10 Codes Code Description L89.153 Pressure ulcer of sacral region, stage 3 G20 Parkinson's disease L03.312 Cellulitis of back [any part except buttock] S81.811D Laceration without foreign body, right lower leg, subsequent encounter Facility Procedures CPT4 Code: [id number] Description: [id number] - DEBRIDE WOUND 1ST 20 SQ CM OR < ICD-10 Diagnosis Description S81.811D Laceration without foreign body, right lower leg, subsequent Modifier: encounter Quantity: 1 Physician Procedures CPT4 Code: [id number] Description: 97597 - WC PHYS DEBR WO ANESTH 20 SQ CM ICD-10 Diagnosis Description S81.811D Laceration without foreign body, right lower leg, subsequent Modifier: encounter Quantity: 1 Electronic Signature(s) Signed: 08/12/2017 4:36:42 PM By: Baltazar Najjar MD Entered By: Baltazar Najjar on 08/11/2017 11:01:19

## 2017-08-18 ENCOUNTER — Encounter: Payer: Medicare Other | Admitting: Internal Medicine

## 2017-08-21 ENCOUNTER — Ambulatory Visit: Payer: Medicare Other | Admitting: Family Medicine

## 2017-08-21 ENCOUNTER — Ambulatory Visit: Payer: Self-pay

## 2017-08-21 ENCOUNTER — Other Ambulatory Visit: Payer: Self-pay

## 2017-08-21 ENCOUNTER — Encounter: Payer: Self-pay | Admitting: Family Medicine

## 2017-08-21 VITALS — BP 177/80 | HR 56 | Temp 97.4°F

## 2017-08-21 DIAGNOSIS — B370 Candidal stomatitis: Secondary | ICD-10-CM

## 2017-08-21 DIAGNOSIS — L89159 Pressure ulcer of sacral region, unspecified stage: Secondary | ICD-10-CM

## 2017-08-21 MED ORDER — FLUCONAZOLE 150 MG PO TABS: 150.0000 mg | ORAL_TABLET | Freq: Every day | ORAL | 0 refills | Status: DC

## 2017-08-21 MED ORDER — DOXYCYCLINE HYCLATE 100 MG PO CAPS: 100.0000 mg | ORAL_CAPSULE | Freq: Two times a day (BID) | ORAL | 0 refills | Status: DC

## 2017-08-21 NOTE — Telephone Encounter (Signed)
  Reason for Disposition . [1] Skin around the wound has become red AND [2] larger than 2 inches (5 cm)  Answer Assessment - Initial Assessment Questions 1. LOCATION: "Where is the wound located?"      Pressure wound to sacrum 2. WOUND APPEARANCE: "What does the wound look like?"      Pressure ulcer 2" across x 2 1/2-3 inches. Wound bed is pink. 3. SIZE: If redness is present, ask: "What is the size of the red area?" (Inches, centimeters, or compare to size of a coin)     Lip of wound bed: approximately 1" x 2" 4. SPREAD: "What's changed in the last day?"  "Do you see any red streaks coming from the wound?"     No streaking, but more swollen. 5. ONSET: "When did it start to look infected?"      Wednesday 6. MECHANISM: "How did the wound start, what was the cause?"     Pressure ulcer 7. PAIN: "Is there any pain?" If so, ask: "How bad is the pain?"   (Scale 1-10; or mild, moderate, severe)     Pt. Non-verbal 8. FEVER: "Do you have a fever?" If so, ask: "What is your temperature, how was it measured, and when did it start?"     Fever yesterday 99.7 axillary 9. OTHER SYMPTOMS: "Do you have any other symptoms?" (e.g., shaking chills, weakness, rash elsewhere on body)     Decreased appetite 10. PREGNANCY: "Is there any chance you are pregnant?" "When was your last menstrual period?"       No  Protocols used: WOUND INFECTION-A-AH Daughter reports pt. Was on Keflex 2 weeks ago with wound infection. Is concerned it will get worse over the weekend. Appointment made for today.

## 2017-08-21 NOTE — Progress Notes (Signed)
BP (!) 177/80   Pulse (!) 56   Temp (!) 97.4 F (36.3 C)    Subjective:    Patient ID: [name], female    DOB: [birth year], 83 y.o.   MRN: [id number]  HPI: Tina Mainlandttalene W Janssen is a 83 y.o. female  Chief Complaint  Patient presents with  . Wound Check    been going to wound center, Monday it looked ok, Wed it started turning black, red and swollen along the edge of it. Started running a fever. Was last cultured on 07/28/17 and treated with Keflex.   Patient here today with caregiver and daughter, who provide entire history. Patient presents with worsening erythema, edema, and pain of pressure sore at tailbone. Has been followed by wound care with twice weekly home health nursing care for most of the year for this. Was recently on keflex given by wound center, has been off for several weeks and was doing well up until 2 days ago. Wound has turned black in the center, red and swollen on upper lip of wound. Low grade fevers at onset. Has had 3 doses of augmentin that they had leftover at the house so far.   Caregiver also notes white coating on tongue the past few days. Pt tolerating PO at baseline level. Has not tried anything OTC for this.   Relevant past medical, surgical, family and social history reviewed and updated as indicated. Interim medical history since our last visit reviewed. Allergies and medications reviewed and updated.   Review of systems difficult as pt non-verbal at this time - per caregivers, all other aspects are at baseline other than what is stated. Review of Systems  Constitutional: Positive for fever.  HENT:       White coating on tongue  Respiratory: Negative.   Cardiovascular: Negative.   Gastrointestinal: Negative.   Musculoskeletal: Negative.   Skin: Positive for wound.  Neurological: Negative.   Psychiatric/Behavioral: Negative.     Per HPI unless specifically indicated above     Objective:    BP (!) 177/80   Pulse (!) 56   Temp (!) 97.4  F (36.3 C)   Wt Readings from Last 3 Encounters:  10/13/16 96 lb 6.4 oz (43.7 kg)  06/23/16 110 lb (49.9 kg)  12/24/15 101 lb 1.6 oz (45.9 kg)    Physical Exam  Constitutional:  Significantly underweight   HENT:  Head: Atraumatic.  Difficult to visualize tongue due to patient cooperation, was able to use tongue depressor to get a small look and noted white coating along middle of tongue  Eyes: Conjunctivae are normal. No scleral icterus.  Neck: Neck supple.  Cardiovascular: Normal rate and normal heart sounds.  Pulmonary/Chest: Effort normal and breath sounds normal. No respiratory distress.  Musculoskeletal:  Wheelchair bound, limited mobility of left arm due to previous injuries  Neurological: She is alert.  Skin: Skin is warm and dry. There is erythema.  5 cm diameter circular full thickness pressure sore at base of tailbone with raised, erythematous and tender edges  Psychiatric:  Non-verbal, at baseline per daughter   Nursing note and vitals reviewed.     Assessment & Plan:   Problem List Items Addressed This Visit      Musculoskeletal and Integument   [place] - Primary    Will d/c home augmentin, start doxycycline (susceptible per last wound culture with wound center 06/2017. Continue home dressings per their instruction. F/u Monday morning with wound care for further management. Tylenol and ibuprofen  prn for pain and fevers.        Other Visit Diagnoses    Oral thrush       Pt unable to gargle, will try treating with oral diflucan. Start probiotics, yogurt. F/u if no improvement.    Relevant Medications   fluconazole (DIFLUCAN) 150 MG tablet       Follow up plan: Return if symptoms worsen or fail to improve.

## 2017-08-24 NOTE — Patient Instructions (Signed)
Follow up as needed

## 2017-08-24 NOTE — Assessment & Plan Note (Signed)
Will d/c home augmentin, start doxycycline (susceptible per last wound culture with wound center 06/2017. Continue home dressings per their instruction. F/u Monday morning with wound care for further management. Tylenol and ibuprofen prn for pain and fevers.

## 2017-08-25 ENCOUNTER — Encounter: Payer: Medicare Other | Admitting: Internal Medicine

## 2017-08-25 DIAGNOSIS — L89153 Pressure ulcer of sacral region, stage 3: Secondary | ICD-10-CM | POA: Diagnosis not present

## 2017-08-26 NOTE — Progress Notes (Signed)
[name], [name] ([id number]) Visit Report for 08/25/2017 Arrival Information Details Patient Name: [name], [name] Date of Service: 08/25/2017 10:15 AM Medical Record Number: [id number] Patient Account Number: [id number] Date of Birth/Sex: 05-06-1923 (83 y.o. Female) Treating RN: Curtis Sites Primary Care Daphene Chisholm: Vonita Moss Other Clinician: Referring Vera Wishart: Vonita Moss Treating Keelie Zemanek/Extender: Altamese [place] in Treatment: 32 Visit Information History Since Last Visit Added or deleted any medications: No Patient Arrived: Wheel Chair Any new allergies or adverse reactions: No Arrival Time: 10:28 Had a fall or experienced change in No activities of daily living that may affect Accompanied By: dtr risk of falls: Transfer Assistance: Manual Signs or symptoms of abuse/neglect since last visito No Patient Identification Verified: Yes Hospitalized since last visit: No Secondary Verification Process Completed: Yes Has Dressing in Place as Prescribed: Yes Patient Requires Transmission-Based No Pain Present Now: No Precautions: Patient Has Alerts: No Electronic Signature(s) Signed: 08/25/2017 5:05:53 PM By: Curtis Sites Entered By: Curtis Sites on 08/25/2017 10:28:40 [name] ([id number]) -------------------------------------------------------------------------------- Clinic Level of Care Assessment Details Patient Name: [name] Date of Service: 08/25/2017 10:15 AM Medical Record Number: [id number] Patient Account Number: [id number] Date of Birth/Sex: Jan 24, 1923 (83 y.o. Female) Treating RN: Curtis Sites Primary Care Avie Checo: Vonita Moss Other Clinician: Referring Victoriya Pol: Vonita Moss Treating Quasim Doyon/Extender: Altamese [place] in Treatment: 32 Clinic Level of Care Assessment Items TOOL 4 Quantity Score []  - Use when only an EandM is performed on FOLLOW-UP visit 0 ASSESSMENTS - Nursing Assessment / Reassessment X -  Reassessment of Co-morbidities (includes updates in patient status) 1 10 X- 1 5 Reassessment of Adherence to Treatment Plan ASSESSMENTS - Wound and Skin Assessment / Reassessment X - Simple Wound Assessment / Reassessment - one wound 1 5 []  - 0 Complex Wound Assessment / Reassessment - multiple wounds []  - 0 Dermatologic / Skin Assessment (not related to wound area) ASSESSMENTS - Focused Assessment []  - Circumferential Edema Measurements - multi extremities 0 []  - 0 Nutritional Assessment / Counseling / Intervention []  - 0 Lower Extremity Assessment (monofilament, tuning fork, pulses) []  - 0 Peripheral Arterial Disease Assessment (using hand held doppler) ASSESSMENTS - Ostomy and/or Continence Assessment and Care []  - Incontinence Assessment and Management 0 []  - 0 Ostomy Care Assessment and Management (repouching, etc.) PROCESS - Coordination of Care X - Simple Patient / Family Education for ongoing care 1 15 []  - 0 Complex (extensive) Patient / Family Education for ongoing care []  - 0 Staff obtains Chiropractor, Records, Test Results / Process Orders []  - 0 Staff telephones HHA, Nursing Homes / Clarify orders / etc []  - 0 Routine Transfer to another Facility (non-emergent condition) []  - 0 Routine Hospital Admission (non-emergent condition) []  - 0 New Admissions / Manufacturing engineer / Ordering NPWT, Apligraf, etc. []  - 0 Emergency Hospital Admission (emergent condition) X- 1 10 Simple Discharge Coordination CAMBRYN, CHARTERS ([id number]) []  - 0 Complex (extensive) Discharge Coordination PROCESS - Special Needs []  - Pediatric / Minor Patient Management 0 []  - 0 Isolation Patient Management []  - 0 Hearing / Language / Visual special needs []  - 0 Assessment of Community assistance (transportation, D/C planning, etc.) []  - 0 Additional assistance / Altered mentation []  - 0 Support Surface(s) Assessment (bed, cushion, seat, etc.) INTERVENTIONS - Wound Cleansing /  Measurement X - Simple Wound Cleansing - one wound 1 5 []  - 0 Complex Wound Cleansing - multiple wounds X- 1 5 Wound Imaging (photographs - any number of wounds) []  - 0  Wound Tracing (instead of photographs) X- 1 5 Simple Wound Measurement - one wound []  - 0 Complex Wound Measurement - multiple wounds INTERVENTIONS - Wound Dressings X - Small Wound Dressing one or multiple wounds 1 10 []  - 0 Medium Wound Dressing one or multiple wounds []  - 0 Large Wound Dressing one or multiple wounds []  - 0 Application of Medications - topical []  - 0 Application of Medications - injection INTERVENTIONS - Miscellaneous []  - External ear exam 0 []  - 0 Specimen Collection (cultures, biopsies, blood, body fluids, etc.) []  - 0 Specimen(s) / Culture(s) sent or taken to Lab for analysis X- 1 10 Patient Transfer (multiple staff / Nurse, adultHoyer Lift / Similar devices) []  - 0 Simple Staple / Suture removal (25 or less) []  - 0 Complex Staple / Suture removal (26 or more) []  - 0 Hypo / Hyperglycemic Management (close monitor of Blood Glucose) []  - 0 Ankle / Brachial Index (ABI) - do not check if billed separately X- 1 5 Vital Signs [name], [name] ([id number]) Has the patient been seen at the hospital within the last three years: Yes Total Score: 85 Level Of Care: New/Established - Level 3 Electronic Signature(s) Signed: 08/25/2017 5:05:53 PM By: Curtis Sitesorthy, Joanna Entered By: Curtis Sitesorthy, Joanna on 08/25/2017 12:55:21 [name], [name] ([id number]) -------------------------------------------------------------------------------- Encounter Discharge Information Details Patient Name: [name], [name] Date of Service: 08/25/2017 10:15 AM Medical Record Number: [id number] Patient Account Number: [id number] Date of Birth/Sex: 1922/12/11 (83 y.o. Female) Treating RN: Curtis Sitesorthy, Joanna Primary Care Rondey Fallen: Vonita MossRISSMAN, MARK Other Clinician: Referring Edwin Cherian: Vonita MossRISSMAN, MARK Treating Yamili Lichtenwalner/Extender: Altamese CarolinaOBSON,  MICHAEL G Weeks in Treatment: 5032 Encounter Discharge Information Items Discharge Pain Level: 0 Discharge Condition: Stable Ambulatory Status: Wheelchair Discharge Destination: Home Private Transportation: Auto Accompanied By: dtr Schedule Follow-up Appointment: Yes Medication Reconciliation completed and provided No to Patient/Care Kambrie Eddleman: Clinical Summary of Care: Electronic Signature(s) Signed: 08/25/2017 5:05:53 PM By: Curtis Sitesorthy, Joanna Entered By: Curtis Sitesorthy, Joanna on 08/25/2017 12:56:06 [name], [name] ([id number]) -------------------------------------------------------------------------------- Multi Wound Chart Details Patient Name: [name], [name] Date of Service: 08/25/2017 10:15 AM Medical Record Number: [id number] Patient Account Number: [id number] Date of Birth/Sex: 1922/12/11 (83 y.o. Female) Treating RN: Curtis Sitesorthy, Joanna Primary Care Cindi Ghazarian: Vonita MossRISSMAN, MARK Other Clinician: Referring Cristoval Teall: Vonita MossRISSMAN, MARK Treating Fatemah Pourciau/Extender: Altamese CarolinaOBSON, MICHAEL G Weeks in Treatment: 32 Vital Signs Height(in): 60 Pulse(bpm): 54 Weight(lbs): 100 Blood Pressure(mmHg): 142/77 Body Mass Index(BMI): 20 Temperature(F): 98.3 Respiratory Rate 14 (breaths/min): Photos: [1:No Photos] [4:No Photos] [N/A:N/A] Wound Location: [1:Coccyx - Midline] [4:Right, Anterior Lower Leg] [N/A:N/A] Wounding Event: [1:Pressure Injury] [4:Trauma] [N/A:N/A] Primary Etiology: [1:Pressure Ulcer] [4:Skin Tear] [N/A:N/A] Comorbid History: [1:Anemia, Hypertension, Dementia] [4:N/A] [N/A:N/A] Date Acquired: [1:12/01/2016] [4:08/11/2017] [N/A:N/A] Weeks of Treatment: [1:32] [4:2] [N/A:N/A] Wound Status: [1:Open] [4:Healed - Epithelialized] [N/A:N/A] Clustered Wound: [1:No] [4:Yes] [N/A:N/A] Measurements L x W x D [1:5.5x5.5x0.6] [4:0x0x0] [N/A:N/A] (cm) Area (cm) : [1:23.758] [4:0] [N/A:N/A] Volume (cm) : [1:14.255] [4:0] [N/A:N/A] % Reduction in Area: [1:-471.80%] [4:100.00%] [N/A:N/A] %  Reduction in Volume: [1:-757.70%] [4:100.00%] [N/A:N/A] Starting Position 1 [1:10] (o'clock): Ending Position 1 [1:3] (o'clock): Maximum Distance 1 (cm): [1:1.5] Undermining: [1:Yes] [4:N/A] [N/A:N/A] Classification: [1:Category/Stage IV] [4:Partial Thickness] [N/A:N/A] Exudate Amount: [1:Large] [4:N/A] [N/A:N/A] Exudate Type: [1:Purulent] [4:N/A] [N/A:N/A] Exudate Color: [1:yellow, brown, green] [4:N/A] [N/A:N/A] Foul Odor After Cleansing: [1:Yes] [4:N/A] [N/A:N/A] Odor Anticipated Due to [1:No] [4:N/A] [N/A:N/A] Product Use: Wound Margin: [1:Flat and Intact] [4:N/A] [N/A:N/A] Granulation Amount: [1:Large (67-100%)] [4:N/A] [N/A:N/A] Granulation Quality: [1:Red] [4:N/A] [N/A:N/A] Necrotic Amount: [1:Small (1-33%)] [4:N/A] [N/A:N/A] Necrotic Tissue: [1:Eschar, Adherent Slough] [4:N/A] [N/A:N/A] Exposed  Structures: [1:Fat Layer (Subcutaneous Tissue) Exposed: Yes Muscle: Yes] [4:N/A] [N/A:N/A] Fascia: No Tendon: No Joint: No Bone: No Epithelialization: None N/A N/A Periwound Skin Texture: Induration: Yes No Abnormalities Noted N/A Excoriation: No Callus: No Crepitus: No Rash: No Scarring: No Periwound Skin Moisture: Maceration: No No Abnormalities Noted N/A Dry/Scaly: No Periwound Skin Color: Erythema: Yes No Abnormalities Noted N/A Atrophie Blanche: No Cyanosis: No Ecchymosis: No Hemosiderin Staining: No Mottled: No Pallor: No Rubor: No Erythema Location: Circumferential N/A N/A Temperature: No Abnormality N/A N/A Tenderness on Palpation: Yes No N/A Wound Preparation: Ulcer Cleansing: N/A N/A Rinsed/Irrigated with Saline Topical Anesthetic Applied: Other: lidocaine 4% Treatment Notes Electronic Signature(s) Signed: 08/25/2017 4:33:39 PM By: Baltazar Najjar MD Entered By: Baltazar Najjar on 08/25/2017 11:14:26 [name] ([id number]) -------------------------------------------------------------------------------- Multi-Disciplinary Care Plan  Details Patient Name: [name] Date of Service: 08/25/2017 10:15 AM Medical Record Number: [id number] Patient Account Number: [id number] Date of Birth/Sex: 1922-12-09 (83 y.o. Female) Treating RN: Curtis Sites Primary Care Laurajean Hosek: Vonita Moss Other Clinician: Referring Kenderick Kobler: Vonita Moss Treating Senica Crall/Extender: Altamese [place] in Treatment: 32 Active Inactive ` Abuse / Safety / Falls / Self Care Management Nursing Diagnoses: Impaired physical mobility Potential for falls Goals: Patient will remain injury free Date Initiated: 01/07/2017 Target Resolution Date: 04/03/2017 Goal Status: Active Interventions: Assess fall risk on admission and as needed Notes: ` Nutrition Nursing Diagnoses: Potential for alteratiion in Nutrition/Potential for imbalanced nutrition Goals: Patient/caregiver agrees to and verbalizes understanding of need to use nutritional supplements and/or vitamins as prescribed Date Initiated: 01/07/2017 Target Resolution Date: 04/03/2017 Goal Status: Active Interventions: Assess patient nutrition upon admission and as needed per policy Notes: ` Orientation to the Wound Care Program Nursing Diagnoses: Knowledge deficit related to the wound healing center program Goals: Patient/caregiver will verbalize understanding of the Wound Healing Center Program Date Initiated: 01/07/2017 Target Resolution Date: 04/03/2017 Goal Status: Active KATE, LAROCK ([id number]) Interventions: Provide education on orientation to the wound center Notes: ` Pressure Nursing Diagnoses: Knowledge deficit related to causes and risk factors for pressure ulcer development Goals: Patient will remain free from development of additional pressure ulcers Date Initiated: 01/07/2017 Target Resolution Date: 04/03/2017 Goal Status: Active Interventions: Assess potential for pressure ulcer upon admission and as needed Notes: ` Wound/Skin Impairment Nursing  Diagnoses: Impaired tissue integrity Goals: Patient/caregiver will verbalize understanding of skin care regimen Date Initiated: 01/07/2017 Target Resolution Date: 04/03/2017 Goal Status: Active Ulcer/skin breakdown will have a volume reduction of 30% by week 4 Date Initiated: 01/07/2017 Target Resolution Date: 04/03/2017 Goal Status: Active Ulcer/skin breakdown will have a volume reduction of 50% by week 8 Date Initiated: 01/07/2017 Target Resolution Date: 04/03/2017 Goal Status: Active Ulcer/skin breakdown will have a volume reduction of 80% by week 12 Date Initiated: 01/07/2017 Target Resolution Date: 04/03/2017 Goal Status: Active Ulcer/skin breakdown will heal within 14 weeks Date Initiated: 01/07/2017 Target Resolution Date: 04/03/2017 Goal Status: Active Interventions: Assess patient/caregiver ability to obtain necessary supplies Assess patient/caregiver ability to perform ulcer/skin care regimen upon admission and as needed Assess ulceration(s) every visit Notes: Electronic Signature(s) KATERI, BALCH ([id number]) Signed: 08/25/2017 5:05:53 PM By: Curtis Sites Entered By: Curtis Sites on 08/25/2017 10:53:15 [name] ([id number]) -------------------------------------------------------------------------------- Pain Assessment Details Patient Name: [name] Date of Service: 08/25/2017 10:15 AM Medical Record Number: [id number] Patient Account Number: [id number] Date of Birth/Sex: 04/28/1923 (83 y.o. Female) Treating RN: Curtis Sites Primary Care Yordin Rhoda: Vonita Moss Other Clinician: Referring Arben Packman: Vonita Moss Treating Norbert Malkin/Extender: Altamese [place] in  Treatment: 32 Active Problems Location of Pain Severity and Description of Pain Patient Has Paino No Site Locations Pain Management and Medication Current Pain Management: Electronic Signature(s) Signed: 08/25/2017 5:05:53 PM By: Curtis Sites Entered By: Curtis Sites on  08/25/2017 10:28:47 [name] ([id number]) -------------------------------------------------------------------------------- Patient/Caregiver Education Details Patient Name: [name] Date of Service: 08/25/2017 10:15 AM Medical Record Number: [id number] Patient Account Number: [id number] Date of Birth/Gender: 1922-11-17 (83 y.o. Female) Treating RN: Curtis Sites Primary Care Physician: Vonita Moss Other Clinician: Referring Physician: Vonita Moss Treating Physician/Extender: Altamese [place] in Treatment: 19 Education Assessment Education Provided To: Caregiver Education Topics Provided Wound/Skin Impairment: Handouts: Other: wound care as ordered Methods: Demonstration, Explain/Verbal Responses: State content correctly Electronic Signature(s) Signed: 08/25/2017 5:05:53 PM By: Curtis Sites Entered By: Curtis Sites on 08/25/2017 12:56:28 [name] ([id number]) -------------------------------------------------------------------------------- Wound Assessment Details Patient Name: [name] Date of Service: 08/25/2017 10:15 AM Medical Record Number: [id number] Patient Account Number: [id number] Date of Birth/Sex: 07/06/1923 (83 y.o. Female) Treating RN: Curtis Sites Primary Care Keneisha Heckart: Vonita Moss Other Clinician: Referring Haydon Dorris: Vonita Moss Treating Zahraa Bhargava/Extender: Altamese [place] in Treatment: 32 Wound Status Wound Number: 1 Primary Etiology: Pressure Ulcer Wound Location: Coccyx - Midline Wound Status: Open Wounding Event: Pressure Injury Comorbid History: Anemia, Hypertension, Dementia Date Acquired: 12/01/2016 Weeks Of Treatment: 32 Clustered Wound: No Wound Measurements Length: (cm) 5.5 Width: (cm) 5.5 Depth: (cm) 0.6 Area: (cm) 23.758 Volume: (cm) 14.255 % Reduction in Area: -471.8% % Reduction in Volume: -757.7% Epithelialization: None Tunneling: No Undermining: Yes Starting  Position (o'clock): 10 Ending Position (o'clock): 3 Maximum Distance: (cm) 1.5 Wound Description Classification: Category/Stage IV Foul Odo Wound Margin: Flat and Intact Due to P Exudate Amount: Large Slough/F Exudate Type: Purulent Exudate Color: yellow, brown, green r After Cleansing: Yes roduct Use: No ibrino Yes Wound Bed Granulation Amount: Large (67-100%) Exposed Structure Granulation Quality: Red Fascia Exposed: No Necrotic Amount: Small (1-33%) Fat Layer (Subcutaneous Tissue) Exposed: Yes Necrotic Quality: Eschar, Adherent Slough Tendon Exposed: No Muscle Exposed: Yes Necrosis of Muscle: No Joint Exposed: No Bone Exposed: No Periwound Skin Texture Texture Color No Abnormalities Noted: No No Abnormalities Noted: No Callus: No Atrophie Blanche: No Crepitus: No Cyanosis: No Excoriation: No Ecchymosis: No Induration: Yes Erythema: Yes Rash: No Erythema Location: Circumferential Scarring: No Hemosiderin Staining: No JACQUELYN, SHADRICK. ([id number]) Moisture Mottled: No No Abnormalities Noted: No Pallor: No Dry / Scaly: No Rubor: No Maceration: No Temperature / Pain Temperature: No Abnormality Tenderness on Palpation: Yes Wound Preparation Ulcer Cleansing: Rinsed/Irrigated with Saline Topical Anesthetic Applied: Other: lidocaine 4%, Treatment Notes Wound #1 (Midline Coccyx) 1. Cleansed with: Clean wound with Normal Saline 2. Anesthetic Topical Lidocaine 4% cream to wound bed prior to debridement 4. Dressing Applied: Prisma Ag 5. Secondary Dressing Applied Bordered Foam Dressing Dry Gauze Electronic Signature(s) Signed: 08/25/2017 5:05:53 PM By: Curtis Sites Entered By: Curtis Sites on 08/25/2017 10:46:48 [name] ([id number]) -------------------------------------------------------------------------------- Wound Assessment Details Patient Name: [name] Date of Service: 08/25/2017 10:15 AM Medical Record Number:  [id number] Patient Account Number: [id number] Date of Birth/Sex: [birth year] (83 y.o. Female) Treating RN: Curtis Sites Primary Care Ishaq Maffei: Vonita Moss Other Clinician: Referring Chou Busler: Vonita Moss Treating Shaila Gilchrest/Extender: Altamese [place] in Treatment: 32 Wound Status Wound Number: 4 Primary Etiology: Skin Tear Wound Location: Right, Anterior Lower Leg Wound Status: Healed - Epithelialized Wounding Event: Trauma Date Acquired: 08/11/2017 Weeks Of Treatment: 2 Clustered Wound: Yes Wound Measurements Length: (cm) 0 Width: (cm)  0 Depth: (cm) 0 Area: (cm) 0 Volume: (cm) 0 % Reduction in Area: 100% % Reduction in Volume: 100% Wound Description Classification: Partial Thickness Periwound Skin Texture Texture Color No Abnormalities Noted: No No Abnormalities Noted: No Moisture No Abnormalities Noted: No Electronic Signature(s) Signed: 08/25/2017 5:05:53 PM By: Curtis Sitesorthy, Joanna Entered By: Curtis Sitesorthy, Joanna on 08/25/2017 10:45:40 [name], Swathi W. ([id number]) -------------------------------------------------------------------------------- Vitals Details Patient Name: [name], Aedyn W. Date of Service: 08/25/2017 10:15 AM Medical Record Number: [id number] Patient Account Number: [id number] Date of Birth/Sex: 06/20/1923 (83 y.o. Female) Treating RN: Curtis Sitesorthy, Joanna Primary Care Dandrea Widdowson: Vonita MossRISSMAN, MARK Other Clinician: Referring Rik Wadel: Vonita MossRISSMAN, MARK Treating Will Schier/Extender: Altamese CarolinaOBSON, MICHAEL G Weeks in Treatment: 32 Vital Signs Time Taken: 10:28 Temperature (F): 98.3 Height (in): 60 Pulse (bpm): 54 Weight (lbs): 100 Respiratory Rate (breaths/min): 14 Body Mass Index (BMI): 19.5 Blood Pressure (mmHg): 142/77 Reference Range: 80 - 120 mg / dl Electronic Signature(s) Signed: 08/25/2017 5:05:53 PM By: Curtis Sitesorthy, Joanna Entered By: Curtis Sitesorthy, Joanna on 08/25/2017 10:34:54

## 2017-08-26 NOTE — Progress Notes (Signed)
[name], Tasmine W. ([id number]) Visit Report for 08/25/2017 HPI Details Patient Name: [name], [name] Date of Service: 08/25/2017 10:15 AM Medical Record Number: [id number] Patient Account Number: [id number] Date of Birth/Sex: 1923/06/21 (83 y.o. Female) Treating RN: Curtis Sitesorthy, Joanna Primary Care Provider: Vonita MossRISSMAN, MARK Other Clinician: Referring Provider: Vonita MossRISSMAN, MARK Treating Provider/Extender: Altamese CarolinaOBSON, Jaiden [place] Weeks in Treatment: 32 History of Present Illness HPI Description: 01/07/17 this is a 83 year old woman admitted to the clinic today for review of a pressure ulcer on her lower sacrum. She is referred from her primary physician's office after being seen on 3/22 with a 3 cm pressure area. Her daughter and caretaker accompanied her today state that the area first became obvious about a month ago and his since deteriorated. They have recently got [name] a home health involved and have been using [name] to the wound. They have ordered a pressure relief surface for her mattress. They are turning her religiously. They state that she eats well and they've been forcing fluids on her. She is on a multivitamin. [facility] point last albumin I see was 4.4 on 10/28. The patient has advanced parkinsonism which looks superficially like advanced Parkinson's disease although her daughter tells me she did not ever respond to Sinemet therefore this may have another pathology with signs of parkinsonism. However I think this is largely a mute point currently. She also has dementia and is nonambulatory. Since this started they have been keeping her in bed and turning her religiously every 2 hours. [place] her husband with 24/7 care giving 01/13/17 santyl change qd. Still will require further debridement. continue santyl. 01/20/17; patient's wound actually looks some better less adherent necrotic surface. There is actually visible granulation. We're using  [name] 01/27/17; better-looking surface but still a lot of necrotic tissue on the base of this wound. The periwound erythema is better than last week we are still using [name]. Her daughter tells [name] that she is still having trouble with the pressure-relief mattress through medical modalities 02/03/18; I had the patient scheduled for a two week followup however her daughter brought her in early concerned for discoloration on 2 areas of the wound circumference. We have bee using santyl 02/10/17;Better looking surface to the wound. Rim appears better suggesting better offloading. Using santyl 02/24/17; change to Silver Collegen last time. Wound appears better. 03/10/17; still using silver collagen religious offloading. Intake is satisfactory per her daughter. Dimension slightly better 03/12/2017 -- [name]'s patient who had been seen 2 days ago and was doing fairly well. The patient is brought in by her daughter who noticed a new wound just above the previous wound on her sacral area and going on more to the left lateral side. She was very concerned and we asked her to get in for an opinion. 03/17/17; above is noted. The patient has developed a progressive area to the left of her original wound. This seems to this started with a ring of red skin with a more pale interior almost looking fungal. There was a rim of blister through part of the area although this did not look like zoster. They have been applying triamcinolone that was prescribed last week by [name] and the area has a fold to a linear band area which is confluent, erythematous and with obvious epidermal swelling but there is no overt tenderness or crepitus. She has lost some surface epithelium closer to the wound surface itself and now has a more superficial wound in  this area and the extending erythema goes towards the left buttock. This is well demarcated between involved in normal skin but once again does not appear to be at all tender.  If there is a contact issue here I cannot get the history out of the daughter or the caregiver that are with her. 03/24/17; the patient arrives today with the wound slightly worse slightly more drainage. The bandlike area of erythema that I treated as a possible pineal infection has improved somewhat although proximally is still has confluent erythema without overt tenderness. We have been using silver alginate since the most recent deterioration. To the bandlike degree of erythema we have been using Lotrisone cream 03/31/17; patient arrives today with the wound slightly larger, necrotic surface and surrounding erythema. The bandlike area of erythema that I treated as a possible pineal infection is less swollen but still present I've been using Lotrisone cream on that largely related to the presence of a tinea looking infection when this was first seen. We've been using silver alginate. X-ray that I ordered last week showed no acute bony abnormalities mild fecal impaction. Lab work showed a comprehensive metabolic panel that was normal including an albumin of 3.8. White count was 9.5 hemoglobin 12.3 differential count normal. [name], [name] ([id number]) C-reactive protein was less than 1 and sedimentation rate and 17. The latter 2 values does not support an ongoing bacterial infection. 04/14/17; patient arrives after a 2 week hiatus. Her wound is not in good condition. Although the base of the wound looks stable she still has an erythematous area that was apparently blistered over the weekend. This again points to the left. As our intake nurse pointed out today this is in the area where the tissue folds together and we may need to prevent try to prevent this. Lab work and x-ray that I did to 3 weeks ago were unremarkable including her albumin nevertheless she is an extremely frail condition physically. We have have been using [name] 04/22/17; I changed her to silver alginate because of the  surrounding maceration and moisture last week. The daughter did not like the way the wound looked in the middle of the week and changed her back to [place]. They're putting gauze on top of this. Thinks still using Lotrisone. 05/06/17 on evaluation today patient sacral wound appears to be doing okay and does not seem to be any worse. She is having no significant pain during evaluation today the secondary to mental status she is unable to rate or describe whether she had any pain she was not however flinching. Her daughter states that the wound does appear to be looking better to her. Still we are having difficulty with the skinfold that seems to be closing in on itself at this point. All in all I feel like she is making some good progress in the [name] seems to be the official for her. They do tell me that a refill if we are gonna continue that today. No fevers, chills, nausea, or vomiting noted at this time. 05/13/17 presents today for evaluation concerning her ongoing sacral pressure ulcer. Unfortunately she also has an area of deep tissue injury in the right Ischial region which is starting to show up. The sacral wound also continues to show signs of necrotic tissue overlying and has declined. Overall we really have not seen a significant improvement in the past several months in regard to the sacral wound and now patient is starting to develop a new wound in the right  Ischial region. Obviously this is not trending in the direction that we want to see. No fevers, chills, nausea, or vomiting noted at this time. 05/19/17; I have not seen this wound and almost a month however there is nothing really positive to say about it. Necrotic tissue over the surface which superiorly I think abuts on her sacrum. She has surrounding erythema. I would be surprised if there is not underlying osteomyelitis or soft tissue infection. 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 She apparently eats well per  description although I wonder about this looking at her. Lab work I did probably 4 weeks ago however was really quite normal including a serum albumin 05/26/17; culture I did last week grew Escherichia coli and methicillin sensitive staph aureus which should've been well covered by the Augmentin and ciprofloxacin. Indeed the erythema around the wound in the bed of the wound looks somewhat better. Her intake is still satisfactory. They now have a near fluidized bed 06/02/17; they completed the antibiotics last Friday. Using collagen. Daughter still reports eating and drinking well. There is less visualized erythema around the wound. 06/16/17; large pressure ulcer over her lower sacrum and coccyx. Using [name] to the wound bed. 06/30/17; certainly no change in dimensions of this large stage III wound over her sacrum and coccyx. They've been using [name]. There is no exposed bone. She has a candidal/tinea area in the right inguinal area. Other than that her daughter relates that she is eating and drinking well there changing her positioning to make sure the areas offloaded 07/14/17; no major change in the dimensions of this large stage III wound. Initially a smaller wound that became secondarily infected causing significant tissue breakdown although it is been stable in the last several weeks. Tunneling superiorly at roughly 1:00 no change here either. There is no bone palpable. Both the patient's daughter and caretaker states that she eats well. I have not rechecked her blood work 07/27/17; patient arrives in clinic today and generally a deteriorated looking state. Mild fever with axillary temperature of 100.4. Daughter reports she has not been eating and drinking well since yesterday. She looks more pale and thin and less responsive. We have been using silver collagen to her wound 08/11/17; since the last time the patient was here things have gone better. Her fever went down and she started eating  and drinking again. Culture I did of the wound showed methicillin sensitive staph aureus, Morganella and enterococcus. I only treated her with Keflex which would've not covered the Morganella and enterococcus however the purulent area on the 2:00 side of her wound is a lot better and the bandlike erythema that concern me also was resolved. I'd called the daughter last week to confirm that she was a lot better. She finished the Keflex last Thursday she also suffered a skin tear this morning perhaps while putting on her incontinence brief period is on the lateral aspect of her right leg. Clean wound with the surface epithelium not viable. 08/25/17; last week the patient was noted to have erythema around the wound margin and a slight fever which the patient's daughter says was 33. Our office was contacted by home health however we did not have a space to work the patient in that she went to see her primary physician Dr. Maurice March. She was not febrile during this visit on 08/21/17 there was erythema around the wound similar to last occasion. Dr. Maurice March in reference to my culture from 07/28/17 and put her  on doxycycline capsules which they're opening twice a day for 10 days. Electronic Signature(s) JOYCIE, AERTS ([id number]) Signed: 08/25/2017 4:33:39 PM By: Baltazar Najjar MD Entered By: Baltazar Najjar on 08/25/2017 11:17:34 [name] ([id number]) -------------------------------------------------------------------------------- Physical Exam Details Patient Name: [name] Date of Service: 08/25/2017 10:15 AM Medical Record Number: [id number] Patient Account Number: [id number] Date of Birth/Sex: [birth year] (82 y.o. Female) Treating RN: Curtis Sites Primary Care Provider: Vonita Moss Other Clinician: Referring Provider: Vonita Moss Treating Provider/Extender: Altamese [place] in Treatment: 32 Constitutional Sitting or standing Blood Pressure is within target range for  patient.. Pulse regular and within target range for patient.Marland Kitchen Respirations regular, non-labored and within target range.. Temperature is normal and within the target range for the patient.Marland Kitchen appears in no distress. Patient looks increasingly frail. Eyes Conjunctivae clear. No discharge. Respiratory Respiratory effort is easy and symmetric bilaterally. Rate is normal at rest and on room air.. Bilateral breath sounds are clear and equal in all lobes with no wheezes, rales or rhonchi.. Cardiovascular Heart rhythm and rate regular, without murmur or gallop.Marland Kitchen Psychiatric End-stage dementia apparently related to Parkinson's dementia complex. Notes Wound exam; the patient's wound is on the lowers sacrum/coccyx. Granulation tissue looks healthy. She has a tunnel at 1:00 and the dimensions here are improved per our intake measurements. There is no erythema around the wound, no obvious tenderness or crepitus Electronic Signature(s) Signed: 08/25/2017 4:33:39 PM By: Baltazar Najjar MD Entered By: Baltazar Najjar on 08/25/2017 11:19:28 [name] ([id number]) -------------------------------------------------------------------------------- Physician Orders Details Patient Name: [name] Date of Service: 08/25/2017 10:15 AM Medical Record Number: [id number] Patient Account Number: [id number] Date of Birth/Sex: 1923/01/28 (83 y.o. Female) Treating RN: Curtis Sites Primary Care Provider: Vonita Moss Other Clinician: Referring Provider: Vonita Moss Treating Provider/Extender: Altamese [place] in Treatment: 7 Verbal / Phone Orders: No Diagnosis Coding Wound Cleansing Wound #1 Midline Coccyx o Clean wound with Normal Saline. o May Shower, gently pat wound dry prior to applying new dressing. Anesthetic Wound #1 Midline Coccyx o Topical Lidocaine 4% cream applied to wound bed prior to debridement - in clinic Skin Barriers/Peri-Wound Care Wound #1 Midline  Coccyx o Skin Prep Primary Wound Dressing Wound #1 Midline Coccyx o Prisma Ag - pack into undermining Secondary Dressing Wound #1 Midline Coccyx o Boardered Foam Dressing - HHRN PLEASE ORDER [employer] BORDERED FOAM FOR PATIENT (OFF BRANDS IRRITATE HER SKIN) Dressing Change Frequency Wound #1 Midline Coccyx o Change dressing every day. Follow-up Appointments Wound #1 Midline Coccyx o Return Appointment in 2 weeks. Off-Loading Wound #1 Midline Coccyx o Roho cushion for wheelchair - HHRN please order roho cushion or gel cushion for patients wheelchair - whichever she qualifies for o Turn and reposition every 2 hours Additional Orders / Instructions Wound #1 Midline Coccyx o Increase protein intake. - please add protein supplements to patients diet o Other: - please add vitamin A, vitamin C and zinc supplements to patients diet LAMIS, BEHRMANN ([id number]) Home Health Wound #1 Midline Coccyx o Continue Home Health Visits - [facility] o Home Health Nurse may visit PRN to address patientos wound care needs. o FACE TO FACE ENCOUNTER: MEDICARE and MEDICAID PATIENTS: I certify that this patient is under my care and that I had a face-to-face encounter that meets the physician face-to-face encounter requirements with this patient on this date. The encounter with the patient was in whole or in part for the following MEDICAL CONDITION: (primary reason for Home Healthcare) MEDICAL NECESSITY: I certify,  that based on my findings, NURSING services are a medically necessary home health service. HOME BOUND STATUS: I certify that my clinical findings support that this patient is homebound (i.e., Due to illness or injury, pt requires aid of supportive devices such as crutches, cane, wheelchairs, walkers, the use of special transportation or the assistance of another person to leave their place of residence. There is a normal inability to leave the home and doing so requires  considerable and taxing effort. Other absences are for medical reasons / religious services and are infrequent or of short duration when for other reasons). o If current dressing causes regression in wound condition, may D/C ordered dressing product/s and apply Normal Saline Moist Dressing daily until next Wound Healing Center / Other MD appointment. Notify Wound Healing Center of regression in wound condition at [phone]. o Please direct any NON-WOUND related issues/requests for orders to patient's Primary Care Physician - Dr Vonita Moss Electronic Signature(s) Signed: 08/25/2017 4:33:39 PM By: Baltazar Najjar MD Signed: 08/25/2017 5:05:53 PM By: Curtis Sites Entered By: Curtis Sites on 08/25/2017 11:07:58 [name] ([id number]) -------------------------------------------------------------------------------- Problem List Details Patient Name: [name] Date of Service: 08/25/2017 10:15 AM Medical Record Number: [id number] Patient Account Number: [id number] Date of Birth/Sex: March 17, 1923 (83 y.o. Female) Treating RN: Curtis Sites Primary Care Provider: Vonita Moss Other Clinician: Referring Provider: Vonita Moss Treating Provider/Extender: Altamese [place] in Treatment: 32 Active Problems ICD-10 Encounter Code Description Active Date Diagnosis L89.153 Pressure ulcer of sacral region, stage 3 01/07/2017 Yes G20 Parkinson's disease 01/07/2017 Yes L03.312 Cellulitis of back [any part except buttock] 07/28/2017 Yes S81.811D Laceration without foreign body, right lower leg, subsequent 08/11/2017 Yes encounter Inactive Problems Resolved Problems Electronic Signature(s) Signed: 08/25/2017 4:33:39 PM By: Baltazar Najjar MD Entered By: Baltazar Najjar on 08/25/2017 11:14:05 [name] ([id number]) -------------------------------------------------------------------------------- Progress Note Details Patient Name: [name] Date of  Service: 08/25/2017 10:15 AM Medical Record Number: [id number] Patient Account Number: [id number] Date of Birth/Sex: 21-Apr-1923 (83 y.o. Female) Treating RN: Curtis Sites Primary Care Provider: Vonita Moss Other Clinician: Referring Provider: Vonita Moss Treating Provider/Extender: Altamese [place] in Treatment: 32 Subjective History of Present Illness (HPI) 01/07/17 this is a 83 year old woman admitted to the clinic today for review of a pressure ulcer on her lower sacrum. She is referred from her primary physician's office after being seen on 3/22 with a 3 cm pressure area. Her daughter and caretaker accompanied her today state that the area first became obvious about a month ago and his since deteriorated. They have recently got [name] a home health involved and have been using [name] to the wound. They have ordered a pressure relief surface for her mattress. They are turning her religiously. They state that she eats well and they've been forcing fluids on her. She is on a multivitamin. Looking through [facility] point last albumin I see was 4.4 on 10/28. The patient has advanced parkinsonism which looks superficially like advanced Parkinson's disease although her daughter tells me she did not ever respond to Sinemet therefore this may have another pathology with signs of parkinsonism. However I think this is largely a mute point currently. She also has dementia and is nonambulatory. Since this started they have been keeping her in bed and turning her religiously every 2 hours. She lives at home in [place] with her husband with 24/7 care giving 01/13/17 santyl change qd. Still will require further debridement. continue santyl. 01/20/17; patient's wound actually looks some better less  adherent necrotic surface. There is actually visible granulation. We're using [name] 01/27/17; better-looking surface but still a lot of necrotic tissue on the base of this wound. The periwound  erythema is better than last week we are still using [name]. Her daughter tells [place] that she is still having trouble with the pressure-relief mattress through medical modalities 02/03/18; I had the patient scheduled for a two week followup however her daughter brought her in early concerned for discoloration on 2 areas of the wound circumference. We have bee using santyl 02/10/17;Better looking surface to the wound. Rim appears better suggesting better offloading. Using santyl 02/24/17; change to Silver Collegen last time. Wound appears better. 03/10/17; still using silver collagen religious offloading. Intake is satisfactory per her daughter. Dimension slightly better 03/12/2017 -- Dr. Jannetta Quint patient who had been seen 2 days ago and was doing fairly well. The patient is brought in by her daughter who noticed a new wound just above the previous wound on her sacral area and going on more to the left lateral side. She was very concerned and we asked her to get in for an opinion. 03/17/17; above is noted. The patient has developed a progressive area to the left of her original wound. This seems to this started with a ring of red skin with a more pale interior almost looking fungal. There was a rim of blister through part of the area although this did not look like zoster. They have been applying triamcinolone that was prescribed last week by Dr. Meyer Russel and the area has a fold to a linear band area which is confluent, erythematous and with obvious epidermal swelling but there is no overt tenderness or crepitus. She has lost some surface epithelium closer to the wound surface itself and now has a more superficial wound in this area and the extending erythema goes towards the left buttock. This is well demarcated between involved in normal skin but once again does not appear to be at all tender. If there is a contact issue here I cannot get the history out of the daughter or the caregiver that are with  her. 03/24/17; the patient arrives today with the wound slightly worse slightly more drainage. The bandlike area of erythema that I treated as a possible pineal infection has improved somewhat although proximally is still has confluent erythema without overt tenderness. We have been using silver alginate since the most recent deterioration. To the bandlike degree of erythema we have been using Lotrisone cream 03/31/17; patient arrives today with the wound slightly larger, necrotic surface and surrounding erythema. The bandlike area of erythema that I treated as a possible pineal infection is less swollen but still present I've been using Lotrisone cream on that largely related to the presence of a tinea looking infection when this was first seen. We've been using silver alginate. X-ray that I ordered last week showed no acute bony abnormalities mild fecal impaction. Lab work showed a comprehensive metabolic panel that was normal including an albumin of 3.8. White count was 9.5 hemoglobin 12.3 differential count normal. C-reactive protein was less than 1 and sedimentation rate and 17. The latter 2 values does not support an ongoing bacterial infection. ADDALEE, KAVANAGH ([id number]) 04/14/17; patient arrives after a 2 week hiatus. Her wound is not in good condition. Although the base of the wound looks stable she still has an erythematous area that was apparently blistered over the weekend. This again points to the left. As our intake nurse pointed out today this  is in the area where the tissue folds together and we may need to prevent try to prevent this. Lab work and x-ray that I did to 3 weeks ago were unremarkable including her albumin nevertheless she is an extremely frail condition physically. We have have been using [name] 04/22/17; I changed her to silver alginate because of the surrounding maceration and moisture last week. The daughter did not like the way the wound looked in the middle of the  week and changed her back to [place]. They're putting gauze on top of this. Thinks still using Lotrisone. 05/06/17 on evaluation today patient sacral wound appears to be doing okay and does not seem to be any worse. She is having no significant pain during evaluation today the secondary to mental status she is unable to rate or describe whether she had any pain she was not however flinching. Her daughter states that the wound does appear to be looking better to her. Still we are having difficulty with the skinfold that seems to be closing in on itself at this point. All in all I feel like she is making some good progress in the [name] seems to be the official for her. They do tell me that a refill if we are gonna continue that today. No fevers, chills, nausea, or vomiting noted at this time. 05/13/17 presents today for evaluation concerning her ongoing sacral pressure ulcer. Unfortunately she also has an area of deep tissue injury in the right Ischial region which is starting to show up. The sacral wound also continues to show signs of necrotic tissue overlying and has declined. Overall we really have not seen a significant improvement in the past several months in regard to the sacral wound and now patient is starting to develop a new wound in the right Ischial region. Obviously this is not trending in the direction that we want to see. No fevers, chills, nausea, or vomiting noted at this time. 05/19/17; I have not seen this wound and almost a month however there is nothing really positive to say about it. Necrotic tissue over the surface which superiorly I think abuts on her sacrum. She has surrounding erythema. I would be surprised if there is not underlying osteomyelitis or soft tissue infection. 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 She apparently eats well per description although I wonder about this looking at her. Lab work I did probably 4 weeks ago however was really quite normal  including a serum albumin 05/26/17; culture I did last week grew Escherichia coli and methicillin sensitive staph aureus which should've been well covered by the Augmentin and ciprofloxacin. Indeed the erythema around the wound in the bed of the wound looks somewhat better. Her intake is still satisfactory. They now have a near fluidized bed 06/02/17; they completed the antibiotics last Friday. Using collagen. Daughter still reports eating and drinking well. There is less visualized erythema around the wound. 06/16/17; large pressure ulcer over her lower sacrum and coccyx. Using [name] to the wound bed. 06/30/17; certainly no change in dimensions of this large stage III wound over her sacrum and coccyx. They've been using [name]. There is no exposed bone. She has a candidal/tinea area in the right inguinal area. Other than that her daughter relates that she is eating and drinking well there changing her positioning to make sure the areas offloaded 07/14/17; no major change in the dimensions of this large stage III wound. Initially a smaller wound that became secondarily infected causing significant  tissue breakdown although it is been stable in the last several weeks. Tunneling superiorly at roughly 1:00 no change here either. There is no bone palpable. Both the patient's daughter and caretaker states that she eats well. I have not rechecked her blood work 07/27/17; patient arrives in clinic today and generally a deteriorated looking state. Mild fever with axillary temperature of 100.4. Daughter reports she has not been eating and drinking well since yesterday. She looks more pale and thin and less responsive. We have been using silver collagen to her wound 08/11/17; since the last time the patient was here things have gone better. Her fever went down and she started eating and drinking again. Culture I did of the wound showed methicillin sensitive staph aureus, Morganella and enterococcus. I  only treated her with Keflex which would've not covered the Morganella and enterococcus however the purulent area on the 2:00 side of her wound is a lot better and the bandlike erythema that concern me also was resolved. I'd called the daughter last week to confirm that she was a lot better. She finished the Keflex last Thursday she also suffered a skin tear this morning perhaps while putting on her incontinence brief period is on the lateral aspect of her right leg. Clean wound with the surface epithelium not viable. 08/25/17; last week the patient was noted to have erythema around the wound margin and a slight fever which the patient's daughter says was 9399. Our office was contacted by home health however we did not have a space to work the patient in that she went to see her primary physician Dr. Maurice MarchLane. She was not febrile during this visit on 08/21/17 there was erythema around the wound similar to last occasion. Dr. Maurice MarchLane in reference to my culture from 07/28/17 and put her on doxycycline capsules which they're opening twice a day for 10 days. [name], Shantal W. ([id number]) Objective Constitutional Sitting or standing Blood Pressure is within target range for patient.. Pulse regular and within target range for patient.Marland Kitchen. Respirations regular, non-labored and within target range.. Temperature is normal and within the target range for the patient.Marland Kitchen. appears in no distress. Patient looks increasingly frail. Vitals Time Taken: 10:28 AM, Height: 60 in, Weight: 100 lbs, BMI: 19.5, Temperature: 98.3 F, Pulse: 54 bpm, Respiratory Rate: 14 breaths/min, Blood Pressure: 142/77 mmHg. Eyes Conjunctivae clear. No discharge. Respiratory Respiratory effort is easy and symmetric bilaterally. Rate is normal at rest and on room air.. Bilateral breath sounds are clear and equal in all lobes with no wheezes, rales or rhonchi.. Cardiovascular Heart rhythm and rate regular, without murmur or  gallop.Marland Kitchen. 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 General Notes: Wound exam; the patient's wound is on the lowers sacrum/coccyx. Granulation tissue looks healthy. She has a tunnel at 1:00 and the dimensions here are improved per our intake measurements. There is no erythema around the wound, no obvious tenderness or crepitus Integumentary (Hair, Skin) Wound #1 status is Open. Original cause of wound was Pressure Injury. The wound is located on the Midline Coccyx. The wound measures 5.5cm length x 5.5cm width x 0.6cm depth; 23.758cm^2 area and 14.255cm^3 volume. There is muscle and Fat Layer (Subcutaneous Tissue) Exposed exposed. There is no tunneling noted, however, there is undermining starting at 10:00 and ending at 3:00 with a maximum distance of 1.5cm. There is a large amount of purulent drainage noted. Foul odor after cleansing was noted. The wound margin is flat and intact. There is large (67-100%) red granulation  within the wound bed. There is a small (1-33%) amount of necrotic tissue within the wound bed including Eschar and Adherent Slough. The periwound skin appearance exhibited: Induration, Erythema. The periwound skin appearance did not exhibit: Callus, Crepitus, Excoriation, Rash, Scarring, Dry/Scaly, Maceration, Atrophie Blanche, Cyanosis, Ecchymosis, Hemosiderin Staining, Mottled, Pallor, Rubor. The surrounding wound skin color is noted with erythema which is circumferential. Periwound temperature was noted as No Abnormality. The periwound has tenderness on palpation. Wound #4 status is Healed - Epithelialized. Original cause of wound was Trauma. The wound is located on the Right,Anterior Lower Leg. The wound measures 0cm length x 0cm width x 0cm depth; 0cm^2 area and 0cm^3 volume. Assessment Active Problems ICD-10 L89.153 - Pressure ulcer of sacral region, stage 3 G20 - Parkinson's disease L03.312 - Cellulitis of back [any part except  buttock] LEDIA, HANFORD. ([id number]) S81.811D - Laceration without foreign body, right lower leg, subsequent encounter Plan Wound Cleansing: Wound #1 Midline Coccyx: Clean wound with Normal Saline. May Shower, gently pat wound dry prior to applying new dressing. Anesthetic: Wound #1 Midline Coccyx: Topical Lidocaine 4% cream applied to wound bed prior to debridement - in clinic Skin Barriers/Peri-Wound Care: Wound #1 Midline Coccyx: Skin Prep Primary Wound Dressing: Wound #1 Midline Coccyx: Prisma Ag - pack into undermining Secondary Dressing: Wound #1 Midline Coccyx: Boardered Foam Dressing - HHRN PLEASE ORDER ALLEVYN LIFE BORDERED FOAM FOR PATIENT (OFF BRANDS IRRITATE HER SKIN) Dressing Change Frequency: Wound #1 Midline Coccyx: Change dressing every day. Follow-up Appointments: Wound #1 Midline Coccyx: Return Appointment in 2 weeks. Off-Loading: Wound #1 Midline Coccyx: Roho cushion for wheelchair - HHRN please order roho cushion or gel cushion for patients wheelchair - whichever she qualifies for Turn and reposition every 2 hours Additional Orders / Instructions: Wound #1 Midline Coccyx: Increase protein intake. - please add protein supplements to patients diet Other: - please add vitamin A, vitamin C and zinc supplements to patients diet Home Health: Wound #1 Midline Coccyx: Continue Home Health Visits - [facility] Nurse may visit PRN to address patient s wound care needs. FACE TO FACE ENCOUNTER: MEDICARE and MEDICAID PATIENTS: I certify that this patient is under my care and that I had a face-to-face encounter that meets the physician face-to-face encounter requirements with this patient on this date. The encounter with the patient was in whole or in part for the following MEDICAL CONDITION: (primary reason for Home Healthcare) MEDICAL NECESSITY: I certify, that based on my findings, NURSING services are a medically necessary home health service. HOME  BOUND STATUS: I certify that my clinical findings support that this patient is homebound (i.e., Due to illness or injury, pt requires aid of supportive devices such as crutches, cane, wheelchairs, walkers, the use of special transportation or the assistance of another person to leave their place of residence. There is a normal inability to leave the home and doing so requires considerable and taxing effort. Other absences are for medical reasons / religious services and are infrequent or of short duration when for other reasons). If current dressing causes regression in wound condition, may D/C ordered dressing product/s and apply Normal Saline Moist Dressing daily until next Wound Healing Center / Other MD appointment. Notify Wound Healing Center of regression in wound condition at [phone]. Please direct any NON-WOUND related issues/requests for orders to patient's Primary Care Physician - Dr Vonita Moss BUNNY, KLEIST. ([id number]) 1Continue with silver collagen,wouind itself is stable to imporved 2 continuing on doxycycline 100 bid for 10 days,  tissue looks imporved per reveiw of Dr. lanes notes Electronic Signature(s) Signed: 08/25/2017 4:33:39 PM By: Baltazar Najjar MD Entered By: Baltazar Najjar on 08/25/2017 11:21:59 [name] ([id number]) -------------------------------------------------------------------------------- SuperBill Details Patient Name: [name] Date of Service: 08/25/2017 Medical Record Number: [id number] Patient Account Number: [id number] Date of Birth/Sex: June 17, 1923 (83 y.o. Female) Treating RN: Curtis Sites Primary Care Provider: Vonita Moss Other Clinician: Referring Provider: Vonita Moss Treating Provider/Extender: Altamese Ludlow in Treatment: 32 Diagnosis Coding ICD-10 Codes Code Description L89.153 Pressure ulcer of sacral region, stage 3 G20 Parkinson's disease L03.312 Cellulitis of back [any part except  buttock] S81.811D Laceration without foreign body, right lower leg, subsequent encounter Facility Procedures CPT4 Code: [id number] Description: 99213 - WOUND CARE VISIT-LEV 3 EST PT Modifier: Quantity: 1 Physician Procedures CPT4 Code: [id number] Description: 99213 - WC PHYS LEVEL 3 - EST PT ICD-10 Diagnosis Description L89.153 Pressure ulcer of sacral region, stage 3 L03.312 Cellulitis of back [any part except buttock] Modifier: Quantity: 1 Electronic Signature(s) Signed: 08/25/2017 4:33:39 PM By: Baltazar Najjar MD Signed: 08/25/2017 5:05:53 PM By: Curtis Sites Entered By: Curtis Sites on 08/25/2017 12:55:28

## 2017-08-28 ENCOUNTER — Telehealth: Payer: Self-pay | Admitting: Family Medicine

## 2017-08-28 NOTE — Telephone Encounter (Signed)
Dawn, from [facility] requesting Nystatin for pt. Call back # [phone]

## 2017-08-28 NOTE — Telephone Encounter (Signed)
Copied from CRM [phone]. Topic: Quick Communication - See Telephone Encounter >> Aug 28, 2017 11:23 AM Everardo PacificMoton, Haylen Bellotti, NT wrote: CRM for notification. See Telephone encounter for:Ms.Dawn from [facility] called because they would like to know if the patient could get Nystatin. If someone could give them a call back at [phone]  08/28/17.

## 2017-08-31 ENCOUNTER — Other Ambulatory Visit: Payer: Self-pay | Admitting: Family Medicine

## 2017-08-31 MED ORDER — NYSTATIN 100000 UNIT/GM EX POWD: Freq: Four times a day (QID) | CUTANEOUS | 0 refills | Status: DC

## 2017-08-31 NOTE — Telephone Encounter (Signed)
Call pt dome

## 2017-08-31 NOTE — Telephone Encounter (Signed)
Attempted to notify Dawn. VM box has not been set up yet.

## 2017-09-01 ENCOUNTER — Telehealth: Payer: Self-pay | Admitting: Family Medicine

## 2017-09-01 MED ORDER — NYSTATIN 100000 UNIT/ML MT SUSP: 5.0000 mL | Freq: Four times a day (QID) | OROMUCOSAL | 0 refills | Status: DC

## 2017-09-01 NOTE — Telephone Encounter (Deleted)
Copied from CRM [id number]. Topic: Inquiry >> Sep 01, 2017 11:21 AM Raquel SarnaHayes, Teresa G wrote:  Pt's daughter Luster LandsbergRenee called to request a liquid Rx for thrush in her mother's mouth instead of Nystatin powder form.  Pt cannot take the powder form.  [facility]

## 2017-09-01 NOTE — Telephone Encounter (Signed)
Copied from CRM [id number]. >> Sep 01, 2017 11:21 AM Raquel SarnaHayes, Teresa G wrote:  Pt's daughter Luster LandsbergRenee called to request a liquid Rx for thrush in her mother's mouth instead of Nystatin powder form.  Pt cannot take the powder form.  [facility]

## 2017-09-01 NOTE — Telephone Encounter (Unsigned)
Copied from CRM #[id number]. Topic: Inquiry >> Sep 01, 2017 11:21 AM Hayes, Teresa G wrote:  Pt's daughter [name] called to request a liquid Rx for thrush in her mother's mouth instead of Nystatin powder form.  Pt cannot take the powder form.  [facility] [address] 

## 2017-09-01 NOTE — Telephone Encounter (Signed)
Requesting liquid form of nystatin

## 2017-09-07 ENCOUNTER — Other Ambulatory Visit: Payer: Self-pay | Admitting: Family Medicine

## 2017-09-07 NOTE — Telephone Encounter (Signed)
Copied from CRM [id number]. Topic: Quick Communication - See Telephone Encounter >> Sep 07, 2017 12:23 PM Terisa Starraylor, Brittany L wrote: CRM for notification. See Telephone encounter for: Pts daughter called and stated her mom still has THRUSH in her mouth, she said she finished the NYSTATIN by Saturday night. She is wanting to know if some more of that could be called in asap. Advised her the practice is closed & offered NT, she denied NT. She said her mom is having a hard time eating and drinking. Her Number is [phone]. [facility] [address].   09/07/17.

## 2017-09-08 ENCOUNTER — Ambulatory Visit: Payer: Medicare Other | Admitting: Surgery

## 2017-09-08 MED ORDER — TERBINAFINE HCL 250 MG PO TABS: 250.0000 mg | ORAL_TABLET | Freq: Every day | ORAL | 1 refills | Status: DC

## 2017-09-08 NOTE — Telephone Encounter (Signed)
Pt.'s daughter requests a refill on Nystatin as soon as possible today. Reports pt. Still has symptoms and is having difficulty eating because of this.

## 2017-09-15 ENCOUNTER — Encounter: Payer: Medicare Other | Attending: Internal Medicine | Admitting: Internal Medicine

## 2017-09-15 DIAGNOSIS — G20 Parkinson's disease: Secondary | ICD-10-CM | POA: Diagnosis not present

## 2017-09-15 DIAGNOSIS — L89153 Pressure ulcer of sacral region, stage 3: Secondary | ICD-10-CM | POA: Diagnosis not present

## 2017-09-15 DIAGNOSIS — F0390 Unspecified dementia without behavioral disturbance: Secondary | ICD-10-CM | POA: Insufficient documentation

## 2017-09-15 DIAGNOSIS — L03312 Cellulitis of back [any part except buttock]: Secondary | ICD-10-CM | POA: Diagnosis not present

## 2017-09-15 DIAGNOSIS — S81811D Laceration without foreign body, right lower leg, subsequent encounter: Secondary | ICD-10-CM | POA: Diagnosis not present

## 2017-09-15 DIAGNOSIS — X58XXXD Exposure to other specified factors, subsequent encounter: Secondary | ICD-10-CM | POA: Insufficient documentation

## 2017-09-17 ENCOUNTER — Telehealth: Payer: Self-pay | Admitting: Family Medicine

## 2017-09-17 NOTE — Progress Notes (Signed)
[name], [name] ([id number]) Visit Report for 09/15/2017 Arrival Information Details Patient Name: [name] Date of Service: 09/15/2017 10:15 AM Medical Record Number: [id number] Patient Account Number: [id number] Date of Birth/Sex: 1922/11/29 (83 y.o. Female) Treating RN: [name] Primary Care [name]: [name] Other Clinician: Referring [name]: [name] Treating [name]: [name] in Treatment: 35 Visit Information History Since Last Visit Added or deleted any medications: No Patient Arrived: Wheel Chair Any new allergies or adverse reactions: No Arrival Time: 11:10 Had a fall or experienced change in No activities of daily living that may affect Accompanied By: dtr risk of falls: Transfer Assistance: Manual Signs or symptoms of abuse/neglect since No Patient Identification Verified: Yes last visito Secondary Verification Process Completed: Yes Hospitalized since last visit: No Patient Requires Transmission-Based No Has Dressing in Place as Prescribed: Yes Precautions: Pain Present Now: Unable to Patient Has Alerts: No Respond Electronic Signature(s) Signed: 09/15/2017 4:51:19 PM By: [name] Entered By: [name] on 09/15/2017 11:10:45 [name] ([id number]) -------------------------------------------------------------------------------- Clinic Level of Care Assessment Details Patient Name: [name] Date of Service: 09/15/2017 10:15 AM Medical Record Number: [id number] Patient Account Number: [id number] Date of Birth/Sex: [birth year] (83 y.o. Female) Treating RN: [name] Primary Care [name]: [name] Other Clinician: Referring [name]: [name] Treating Merrit Waugh/Extender: Tina [place] in Treatment: 35 Clinic Level of Care Assessment Items TOOL 4 Quantity Score []  - Use when only an EandM is performed on FOLLOW-UP visit 0 ASSESSMENTS - Nursing Assessment /  Reassessment X - Reassessment of Co-morbidities (includes updates in patient status) 1 10 X- 1 5 Reassessment of Adherence to Treatment Plan ASSESSMENTS - Wound and Skin Assessment / Reassessment X - Simple Wound Assessment / Reassessment - one wound 1 5 []  - 0 Complex Wound Assessment / Reassessment - multiple wounds []  - 0 Dermatologic / Skin Assessment (not related to wound area) ASSESSMENTS - Focused Assessment []  - Circumferential Edema Measurements - multi extremities 0 []  - 0 Nutritional Assessment / Counseling / Intervention []  - 0 Lower Extremity Assessment (monofilament, tuning fork, pulses) []  - 0 Peripheral Arterial Disease Assessment (using hand held doppler) ASSESSMENTS - Ostomy and/or Continence Assessment and Care []  - Incontinence Assessment and Management 0 []  - 0 Ostomy Care Assessment and Management (repouching, etc.) PROCESS - Coordination of Care X - Simple Patient / Family Education for ongoing care 1 15 []  - 0 Complex (extensive) Patient / Family Education for ongoing care []  - 0 Staff obtains Chiropractor, Records, Test Results / Process Orders []  - 0 Staff telephones HHA, Nursing Homes / Clarify orders / etc []  - 0 Routine Transfer to another Facility (non-emergent condition) []  - 0 Routine Hospital Admission (non-emergent condition) []  - 0 New Admissions / Manufacturing engineer / Ordering NPWT, Apligraf, etc. []  - 0 Emergency Hospital Admission (emergent condition) X- 1 10 Simple Discharge Coordination [name], [name] ([id number]) []  - 0 Complex (extensive) Discharge Coordination PROCESS - Special Needs []  - Pediatric / Minor Patient Management 0 []  - 0 Isolation Patient Management []  - 0 Hearing / Language / Visual special needs []  - 0 Assessment of Community assistance (transportation, D/C planning, etc.) []  - 0 Additional assistance / Altered mentation []  - 0 Support Surface(s) Assessment (bed, cushion, seat, etc.) INTERVENTIONS -  Wound Cleansing / Measurement X - Simple Wound Cleansing - one wound 1 5 []  - 0 Complex Wound Cleansing - multiple wounds X- 1 5 Wound Imaging (photographs - any number of wounds) []  -  0 Wound Tracing (instead of photographs) X- 1 5 Simple Wound Measurement - one wound []  - 0 Complex Wound Measurement - multiple wounds INTERVENTIONS - Wound Dressings X - Small Wound Dressing one or multiple wounds 1 10 []  - 0 Medium Wound Dressing one or multiple wounds []  - 0 Large Wound Dressing one or multiple wounds []  - 0 Application of Medications - topical []  - 0 Application of Medications - injection INTERVENTIONS - Miscellaneous []  - External ear exam 0 []  - 0 Specimen Collection (cultures, biopsies, blood, body fluids, etc.) []  - 0 Specimen(s) / Culture(s) sent or taken to Lab for analysis []  - 0 Patient Transfer (multiple staff / Nurse, adult / Similar devices) []  - 0 Simple Staple / Suture removal (25 or less) []  - 0 Complex Staple / Suture removal (26 or more) []  - 0 Hypo / Hyperglycemic Management (close monitor of Blood Glucose) []  - 0 Ankle / Brachial Index (ABI) - do not check if billed separately X- 1 5 Vital Signs [name], [name] ([id number]) Has the patient been seen at the hospital within the last three years: Yes Total Score: 75 Level Of Care: New/Established - Level 2 Electronic Signature(s) Signed: 09/15/2017 4:51:19 PM By: [name] Entered By: [name] on 09/15/2017 11:57:04 [name] ([id number]) -------------------------------------------------------------------------------- Encounter Discharge Information Details Patient Name: [name] Date of Service: 09/15/2017 10:15 AM Medical Record Number: [id number] Patient Account Number: [id number] Date of Birth/Sex: [birth year] (83 y.o. Female) Treating RN: [name] Primary Care [name]: [name] Other Clinician: Referring Sabastian Raimondi: [name] Treating  [name]: Tina [place] in Treatment: 85 Encounter Discharge Information Items Discharge Pain Level: 0 Discharge Condition: Stable Ambulatory Status: Wheelchair Discharge Destination: Home Transportation: Private Auto Accompanied By: caregiver Schedule Follow-up Appointment: Yes Medication Reconciliation completed and No provided to Patient/Care Alpha Mysliwiec: Provided on Clinical Summary of Care: 09/15/2017 Form Type Recipient Paper Patient ES Electronic Signature(s) Signed: 09/15/2017 11:57:49 AM By: [name] Entered By: [name] on 09/15/2017 11:57:49 [name] ([id number]) -------------------------------------------------------------------------------- Multi Wound Chart Details Patient Name: [name] Date of Service: 09/15/2017 10:15 AM Medical Record Number: [id number] Patient Account Number: [id number] Date of Birth/Sex: 27-Dec-1922 (83 y.o. Female) Treating RN: [name] Primary Care Skip Litke: [name] Other Clinician: Referring Ivionna Verley: [name] Treating Reuben Knoblock/Extender: Tina [place] in Treatment: 35 Vital Signs Height(in): 60 Pulse(bpm): 47 Weight(lbs): 100 Blood Pressure(mmHg): 129/98 Body Mass Index(BMI): 20 Temperature(F): 98.1 Respiratory Rate 14 (breaths/min): Photos: [1:No Photos] [N/A:N/A] Wound Location: [1:Coccyx - Midline] [N/A:N/A] Wounding Event: [1:Pressure Injury] [N/A:N/A] Primary Etiology: [1:Pressure Ulcer] [N/A:N/A] Comorbid History: [1:Anemia, Hypertension, Dementia] [N/A:N/A] Date Acquired: [1:12/01/2016] [N/A:N/A] Weeks of Treatment: [1:35] [N/A:N/A] Wound Status: [1:Open] [N/A:N/A] Measurements L x W x D [1:5.3x5x0.5] [N/A:N/A] (cm) Area (cm) : [1:20.813] [N/A:N/A] Volume (cm) : [1:10.407] [N/A:N/A] % Reduction in Area: [1:-400.90%] [N/A:N/A] % Reduction in Volume: [1:-526.20%] [N/A:N/A] Starting Position 1 [1:6] (o'clock): Ending Position 1  [1:9] (o'clock): Maximum Distance 1 (cm): [1:0.9] Starting Position 2 [1:1] (o'clock): Ending Position 2 [1:3] (o'clock): Maximum Distance 2 (cm): [1:1.6] Undermining: [1:Yes] [N/A:N/A] Classification: [1:Category/Stage IV] [N/A:N/A] Exudate Amount: [1:Large] [N/A:N/A] Exudate Type: [1:Purulent] [N/A:N/A] Exudate Color: [1:yellow, brown, green] [N/A:N/A] Foul Odor After Cleansing: [1:Yes] [N/A:N/A] Odor Anticipated Due to [1:No] [N/A:N/A] Product Use: Wound Margin: [1:Flat and Intact] [N/A:N/A] Granulation Amount: [1:Large (67-100%)] [N/A:N/A] Granulation Quality: [1:Red] [N/A:N/A] Necrotic Amount: [1:Small (1-33%)] [N/A:N/A] Necrotic Tissue: Eschar, Adherent Slough N/A N/A Exposed Structures: Fat Layer (Subcutaneous N/A N/A Tissue) Exposed: Yes Muscle: Yes Fascia:  No Tendon: No Joint: No Bone: No Epithelialization: None N/A N/A Periwound Skin Texture: Induration: Yes N/A N/A Excoriation: No Callus: No Crepitus: No Rash: No Scarring: No Periwound Skin Moisture: Maceration: No N/A N/A Dry/Scaly: No Periwound Skin Color: Erythema: Yes N/A N/A Atrophie Blanche: No Cyanosis: No Ecchymosis: No Hemosiderin Staining: No Mottled: No Pallor: No Rubor: No Erythema Location: Circumferential N/A N/A Temperature: No Abnormality N/A N/A Tenderness on Palpation: Yes N/A N/A Wound Preparation: Ulcer Cleansing: N/A N/A Rinsed/Irrigated with Saline Topical Anesthetic Applied: Other: lidocaine 4% Treatment Notes Wound #1 (Midline Coccyx) 1. Cleansed with: Clean wound with Normal Saline 2. Anesthetic Topical Lidocaine 4% cream to wound bed prior to debridement 4. Dressing Applied: Prisma Ag 5. Secondary Dressing Applied Bordered Foam Dressing Dry Gauze Electronic Signature(s) Signed: 09/16/2017 8:14:14 AM By: Baltazar Najjar MD Entered By: Baltazar Najjar on 09/15/2017 12:19:28 [name]  ([id number]) -------------------------------------------------------------------------------- Multi-Disciplinary Care Plan Details Patient Name: [name] Date of Service: 09/15/2017 10:15 AM Medical Record Number: [id number] Patient Account Number: [id number] Date of Birth/Sex: 07/03/1923 (83 y.o. Female) Treating RN: [name] Primary Care Nichele Slawson: [name] Other Clinician: Referring Jaislyn Blinn: [name] Treating Tasharra Nodine/Extender: Tina [place] in Treatment: 35 Active Inactive ` Abuse / Safety / Falls / Self Care Management Nursing Diagnoses: Impaired physical mobility Potential for falls Goals: Patient will remain injury free Date Initiated: 01/07/2017 Target Resolution Date: 04/03/2017 Goal Status: Active Interventions: Assess fall risk on admission and as needed Notes: ` Nutrition Nursing Diagnoses: Potential for alteratiion in Nutrition/Potential for imbalanced nutrition Goals: Patient/caregiver agrees to and verbalizes understanding of need to use nutritional supplements and/or vitamins as prescribed Date Initiated: 01/07/2017 Target Resolution Date: 04/03/2017 Goal Status: Active Interventions: Assess patient nutrition upon admission and as needed per policy Notes: ` Orientation to the Wound Care Program Nursing Diagnoses: Knowledge deficit related to the wound healing center program Goals: Patient/caregiver will verbalize understanding of the Wound Healing Center Program Date Initiated: 01/07/2017 Target Resolution Date: 04/03/2017 Goal Status: Active MERDIS, SNODGRASS ([id number]) Interventions: Provide education on orientation to the wound center Notes: ` Pressure Nursing Diagnoses: Knowledge deficit related to causes and risk factors for pressure ulcer development Goals: Patient will remain free from development of additional pressure ulcers Date Initiated: 01/07/2017 Target Resolution Date: 04/03/2017 Goal Status:  Active Interventions: Assess potential for pressure ulcer upon admission and as needed Notes: ` Wound/Skin Impairment Nursing Diagnoses: Impaired tissue integrity Goals: Patient/caregiver will verbalize understanding of skin care regimen Date Initiated: 01/07/2017 Target Resolution Date: 04/03/2017 Goal Status: Active Ulcer/skin breakdown will have a volume reduction of 30% by week 4 Date Initiated: 01/07/2017 Target Resolution Date: 04/03/2017 Goal Status: Active Ulcer/skin breakdown will have a volume reduction of 50% by week 8 Date Initiated: 01/07/2017 Target Resolution Date: 04/03/2017 Goal Status: Active Ulcer/skin breakdown will have a volume reduction of 80% by week 12 Date Initiated: 01/07/2017 Target Resolution Date: 04/03/2017 Goal Status: Active Ulcer/skin breakdown will heal within 14 weeks Date Initiated: 01/07/2017 Target Resolution Date: 04/03/2017 Goal Status: Active Interventions: Assess patient/caregiver ability to obtain necessary supplies Assess patient/caregiver ability to perform ulcer/skin care regimen upon admission and as needed Assess ulceration(s) every visit Notes: Electronic Signature(s) [name], [name] ([id number]) Signed: 09/15/2017 4:51:19 PM By: [name] Entered By: [name] on 09/15/2017 11:23:15 [name] ([id number]) -------------------------------------------------------------------------------- Pain Assessment Details Patient Name: [name] Date of Service: 09/15/2017 10:15 AM Medical Record Number: [id number] Patient Account Number: [id number] Date of Birth/Sex: 11-02-1922 (83 y.o. Female) Treating RN:  Tina Sitesorthy, Joanna Primary Care Daylen Lipsky: Tina MossRISSMAN, MARK Other Clinician: Referring Zaya Kessenich: Tina MossRISSMAN, MARK Treating Timonthy Hovater/Extender: Tina CarolinaOBSON, MICHAEL G Weeks in Treatment: 35 Active Problems Location of Pain Severity and Description of Pain Patient Has Paino Patient Unable to Respond Site Locations Pain Management  and Medication Current Pain Management: Electronic Signature(s) Signed: 09/15/2017 4:51:19 PM By: Tina Sitesorthy, Joanna Entered By: Tina Sitesorthy, Joanna on 09/15/2017 11:10:58 [name], [name] ([id number]) -------------------------------------------------------------------------------- Patient/Caregiver Education Details Patient Name: [name], [name] Date of Service: 09/15/2017 10:15 AM Medical Record Number: [id number] Patient Account Number: [id number] Date of Birth/Gender: 10/05/1922 (83 y.o. Female) Treating RN: Tina Sitesorthy, Joanna Primary Care Physician: Tina MossRISSMAN, MARK Other Clinician: Referring Physician: Vonita MossRISSMAN, MARK Treating Physician/Extender: Tina CarolinaOBSON, MICHAEL G Weeks in Treatment: 2235 Education Assessment Education Provided To: Caregiver Education Topics Provided Wound/Skin Impairment: Handouts: Other: wound care as ordered Methods: Demonstration, Explain/Verbal Responses: State content correctly Electronic Signature(s) Signed: 09/15/2017 4:51:19 PM By: Tina Sitesorthy, Joanna Entered By: Tina Sitesorthy, Joanna on 09/15/2017 11:58:24 [name], [name] ([id number]) -------------------------------------------------------------------------------- Wound Assessment Details Patient Name: [name], [name] Date of Service: 09/15/2017 10:15 AM Medical Record Number: [id number] Patient Account Number: [id number] Date of Birth/Sex: 10/05/1922 (83 y.o. Female) Treating RN: Tina Sitesorthy, Joanna Primary Care Alvester Eads: Tina MossRISSMAN, MARK Other Clinician: Referring Oniya Mandarino: Tina MossRISSMAN, MARK Treating Jocelin Schuelke/Extender: Tina CarolinaOBSON, MICHAEL G Weeks in Treatment: 35 Wound Status Wound Number: 1 Primary Etiology: Pressure Ulcer Wound Location: Coccyx - Midline Wound Status: Open Wounding Event: Pressure Injury Comorbid History: Anemia, Hypertension, Dementia Date Acquired: 12/01/2016 Weeks Of Treatment: 35 Clustered Wound: No Photos Photo Uploaded By: Tina Sitesorthy, Joanna on 09/15/2017 14:22:34 Wound Measurements Length: (cm)  5.3 Width: (cm) 5 Depth: (cm) 0.5 Area: (cm) 20.813 Volume: (cm) 10.407 % Reduction in Area: -400.9% % Reduction in Volume: -526.2% Epithelialization: None Undermining: Yes Location 1 Starting Position (o'clock): 6 Ending Position (o'clock): 9 Maximum Distance: (cm) 0.9 Location 2 Starting Position (o'clock): 1 Ending Position (o'clock): 3 Maximum Distance: (cm) 1.6 Wound Description Classification: Category/Stage IV Foul O Wound Margin: Flat and Intact Due to Exudate Amount: Large Slough Exudate Type: Purulent Exudate Color: yellow, brown, green dor After Cleansing: Yes Product Use: No /Fibrino Yes Wound Bed Granulation Amount: Large (67-100%) Exposed Structure [name], [name] ([id number]) Granulation Quality: Red Fascia Exposed: No Necrotic Amount: Small (1-33%) Fat Layer (Subcutaneous Tissue) Exposed: Yes Necrotic Quality: Eschar, Adherent Slough Tendon Exposed: No Muscle Exposed: Yes Necrosis of Muscle: No Joint Exposed: No Bone Exposed: No Periwound Skin Texture Texture Color No Abnormalities Noted: No No Abnormalities Noted: No Callus: No Atrophie Blanche: No Crepitus: No Cyanosis: No Excoriation: No Ecchymosis: No Induration: Yes Erythema: Yes Rash: No Erythema Location: Circumferential Scarring: No Hemosiderin Staining: No Mottled: No Moisture Pallor: No No Abnormalities Noted: No Rubor: No Dry / Scaly: No Maceration: No Temperature / Pain Temperature: No Abnormality Tenderness on Palpation: Yes Wound Preparation Ulcer Cleansing: Rinsed/Irrigated with Saline Topical Anesthetic Applied: Other: lidocaine 4%, Treatment Notes Wound #1 (Midline Coccyx) 1. Cleansed with: Clean wound with Normal Saline 2. Anesthetic Topical Lidocaine 4% cream to wound bed prior to debridement 4. Dressing Applied: Prisma Ag 5. Secondary Dressing Applied Bordered Foam Dressing Dry Gauze Electronic Signature(s) Signed: 09/15/2017 4:51:19 PM By:  Tina Sitesorthy, Joanna Entered By: Tina Sitesorthy, Joanna on 09/15/2017 11:23:01 [name], Shalena W. ([id number]) -------------------------------------------------------------------------------- Vitals Details Patient Name: [name], [name] Date of Service: 09/15/2017 10:15 AM Medical Record Number: [id number] Patient Account Number: [id number] Date of Birth/Sex: 10/05/1922 (83 y.o. Female) Treating RN: Tina Sitesorthy, Joanna Primary Care Toshua Honsinger: Tina MossRISSMAN, MARK Other Clinician: Referring Trask Vosler: Tina MossRISSMAN, MARK Treating Wallice Granville/Extender: Maxwell CaulOBSON, MICHAEL G  Weeks in Treatment: 35 Vital Signs Time Taken: 11:14 Temperature (F): 98.1 Height (in): 60 Pulse (bpm): 47 Weight (lbs): 100 Respiratory Rate (breaths/min): 14 Body Mass Index (BMI): 19.5 Blood Pressure (mmHg): 129/98 Reference Range: 80 - 120 mg / dl Electronic Signature(s) Signed: 09/15/2017 4:51:19 PM By: Tina Sitesorthy, Joanna Entered By: Tina Sitesorthy, Joanna on 09/15/2017 11:14:41

## 2017-09-17 NOTE — Telephone Encounter (Signed)
Copied from CRM [phone]. Topic: Quick Communication - See Telephone Encounter >> Sep 17, 2017  4:50 PM Clack, Princella PellegriniJessica D wrote: CRM for notification. 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 Dossie Arbourrissman know the pt heart rate was 47 after taking metoprolol succinate (TOPROL-XL) 50 MG 24 hr tablet [[id number]][[id number]]. Also her heart rate has been running low before given the medication (around 60) and her BP has been normal.  Dawn would like for Dr. Dossie Arbourrissman or his nurse to f/u with her. [phone]  09/17/17.

## 2017-09-17 NOTE — Progress Notes (Signed)
Tina, [name] ([id number]) Visit Report for 09/15/2017 HPI Details Patient Name: [name], [name] Date of Service: 09/15/2017 10:15 AM Medical Record Number: [id number] Patient Account Number: [id number] Date of Birth/Sex: 04-May-1923 (83 y.o. Female) Treating RN: Curtis Sites Primary Care Provider: Vonita Moss Other Clinician: Referring Provider: Vonita Moss Treating Provider/Extender: Altamese [place] in Treatment: 35 History of Present Illness HPI Description: 01/07/17 this is a 83 year old woman admitted to the clinic today for review of a pressure ulcer on her lower sacrum. She is referred from her primary physician's office after being seen on 3/22 with a 3 cm pressure area. Her daughter and caretaker accompanied her today state that the area first became obvious about a month ago and his since deteriorated. They have recently got [name] a home health involved and have been using [name] to the wound. They have ordered a pressure relief surface for her mattress. They are turning her religiously. They state that she eats well and they've been forcing fluids on her. She is on a multivitamin. Looking through [facility] point last albumin I see was 4.4 on 10/28. The patient has advanced parkinsonism which looks superficially like advanced Parkinson's disease although her daughter tells me she did not ever respond to Sinemet therefore this may have another pathology with signs of parkinsonism. However I think this is largely a mute point currently. She also has dementia and is nonambulatory. Since this started they have been keeping her in bed and turning her religiously every 2 hours. She lives at home in [place] with her husband with 24/7 care giving 01/13/17 santyl change qd. Still will require further debridement. continue santyl. 01/20/17; patient's wound actually looks some better less adherent necrotic surface. There is actually visible granulation. We're using  [name] 01/27/17; better-looking surface but still a lot of necrotic tissue on the base of this wound. The periwound erythema is better than last week we are still using [name]. Her daughter tells [place] that she is still having trouble with the pressure-relief mattress through medical modalities 02/03/18; I had the patient scheduled for a two week followup however her daughter brought her in early concerned for discoloration on 2 areas of the wound circumference. We have bee using santyl 02/10/17;Better looking surface to the wound. Rim appears better suggesting better offloading. Using santyl 02/24/17; change to Silver Collegen last time. Wound appears better. 03/10/17; still using silver collagen religious offloading. Intake is satisfactory per her daughter. Dimension slightly better 03/12/2017 -- Dr. Jannetta Quint patient who had been seen 2 days ago and was doing fairly well. The patient is brought in by her daughter who noticed a new wound just above the previous wound on her sacral area and going on more to the left lateral side. She was very concerned and we asked her to get in for an opinion. 03/17/17; above is noted. The patient has developed a progressive area to the left of her original wound. This seems to this started with a ring of red skin with a more pale interior almost looking fungal. There was a rim of blister through part of the area although this did not look like zoster. They have been applying triamcinolone that was prescribed last week by Dr. Meyer Russel and the area has a fold to a linear band area which is confluent, erythematous and with obvious epidermal swelling but there is no overt tenderness or crepitus. She has lost some surface epithelium closer to the wound surface itself and now has a more superficial wound in  this area and the extending erythema goes towards the left buttock. This is well demarcated between involved in normal skin but once again does not appear to be at all tender.  If there is a contact issue here I cannot get the history out of the daughter or the caregiver that are with her. 03/24/17; the patient arrives today with the wound slightly worse slightly more drainage. The bandlike area of erythema that I treated as a possible pineal infection has improved somewhat although proximally is still has confluent erythema without overt tenderness. We have been using silver alginate since the most recent deterioration. To the bandlike degree of erythema we have been using Lotrisone cream 03/31/17; patient arrives today with the wound slightly larger, necrotic surface and surrounding erythema. The bandlike area of erythema that I treated as a possible pineal infection is less swollen but still present I've been using Lotrisone cream on that largely related to the presence of a tinea looking infection when this was first seen. We've been using silver alginate. X-ray that I ordered last week showed no acute bony abnormalities mild fecal impaction. Lab work showed a comprehensive metabolic panel that was normal including an albumin of 3.8. White count was 9.5 hemoglobin 12.3 differential count normal. [name], [name] ([id number]) C-reactive protein was less than 1 and sedimentation rate and 17. The latter 2 values does not support an ongoing bacterial infection. 04/14/17; patient arrives after a 2 week hiatus. Her wound is not in good condition. Although the base of the wound looks stable she still has an erythematous area that was apparently blistered over the weekend. This again points to the left. As our intake nurse pointed out today this is in the area where the tissue folds together and we may need to prevent try to prevent this. Lab work and x-ray that I did to 3 weeks ago were unremarkable including her albumin nevertheless she is an extremely frail condition physically. We have have been using [name] 04/22/17; I changed her to silver alginate because of the  surrounding maceration and moisture last week. The daughter did not like the way the wound looked in the middle of the week and changed her back to [place]. They're putting gauze on top of this. Thinks still using Lotrisone. 05/06/17 on evaluation today patient sacral wound appears to be doing okay and does not seem to be any worse. She is having no significant pain during evaluation today the secondary to mental status she is unable to rate or describe whether she had any pain she was not however flinching. Her daughter states that the wound does appear to be looking better to her. Still we are having difficulty with the skinfold that seems to be closing in on itself at this point. All in all I feel like she is making some good progress in the [name] seems to be the official for her. They do tell me that a refill if we are gonna continue that today. No fevers, chills, nausea, or vomiting noted at this time. 05/13/17 presents today for evaluation concerning her ongoing sacral pressure ulcer. Unfortunately she also has an area of deep tissue injury in the right Ischial region which is starting to show up. The sacral wound also continues to show signs of necrotic tissue overlying and has declined. Overall we really have not seen a significant improvement in the past several months in regard to the sacral wound and now patient is starting to develop a new wound in the right  Ischial region. Obviously this is not trending in the direction that we want to see. No fevers, chills, nausea, or vomiting noted at this time. 05/19/17; I have not seen this wound and almost a month however there is nothing really positive to say about it. Necrotic tissue over the surface which superiorly I think abuts on her sacrum. She has surrounding erythema. I would be surprised if there is not underlying osteomyelitis or soft tissue infection. 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 She apparently eats well per  description although I wonder about this looking at her. Lab work I did probably 4 weeks ago however was really quite normal including a serum albumin 05/26/17; culture I did last week grew Escherichia coli and methicillin sensitive staph aureus which should've been well covered by the Augmentin and ciprofloxacin. Indeed the erythema around the wound in the bed of the wound looks somewhat better. Her intake is still satisfactory. They now have a near fluidized bed 06/02/17; they completed the antibiotics last Friday. Using collagen. Daughter still reports eating and drinking well. There is less visualized erythema around the wound. 06/16/17; large pressure ulcer over her lower sacrum and coccyx. Using [name] to the wound bed. 06/30/17; certainly no change in dimensions of this large stage III wound over her sacrum and coccyx. They've been using [name]. There is no exposed bone. She has a candidal/tinea area in the right inguinal area. Other than that her daughter relates that she is eating and drinking well there changing her positioning to make sure the areas offloaded 07/14/17; no major change in the dimensions of this large stage III wound. Initially a smaller wound that became secondarily infected causing significant tissue breakdown although it is been stable in the last several weeks. Tunneling superiorly at roughly 1:00 no change here either. There is no bone palpable. Both the patient's daughter and caretaker states that she eats well. I have not rechecked her blood work 07/27/17; patient arrives in clinic today and generally a deteriorated looking state. Mild fever with axillary temperature of 100.4. Daughter reports she has not been eating and drinking well since yesterday. She looks more pale and thin and less responsive. We have been using silver collagen to her wound 08/11/17; since the last time the patient was here things have gone better. Her fever went down and she started eating  and drinking again. Culture I did of the wound showed methicillin sensitive staph aureus, Morganella and enterococcus. I only treated her with Keflex which would've not covered the Morganella and enterococcus however the purulent area on the 2:00 side of her wound is a lot better and the bandlike erythema that concern me also was resolved. I'd called the daughter last week to confirm that she was a lot better. She finished the Keflex last Thursday she also suffered a skin tear this morning perhaps while putting on her incontinence brief period is on the lateral aspect of her right leg. Clean wound with the surface epithelium not viable. 08/25/17; last week the patient was noted to have erythema around the wound margin and a slight fever which the patient's daughter says was 60. Our office was contacted by home health however we did not have a space to work the patient in that she went to see her primary physician Dr. Maurice March. She was not febrile during this visit on 08/21/17 there was erythema around the wound similar to last occasion. Dr. Maurice March in reference to my culture from 07/28/17 and put her  on doxycycline capsules which they're opening twice a day for 10 days. 09/17/17; patient arrives today with the wound bed looking fairly well granulated. There is undermining from 4 to 6:00 although this seems to of contracted slightly. She does not have obvious infection although the daughter states there was NECHELLE, PETRIZZO. ([id number]) some darkening of the wound circumference that is not evident today. They state she is eating well. They are concerned about oral thrush Electronic Signature(s) Signed: 09/16/2017 8:14:14 AM By: Baltazar Najjar MD Entered By: Baltazar Najjar on 09/15/2017 12:21:28 [name] ([id number]) -------------------------------------------------------------------------------- Physical Exam Details Patient Name: [name] Date of Service: 09/15/2017 10:15  AM Medical Record Number: [id number] Patient Account Number: [id number] Date of Birth/Sex: [birth year] (83 y.o. Female) Treating RN: Curtis Sites Primary Care Provider: Vonita Moss Other Clinician: Referring Provider: Vonita Moss Treating Provider/Extender: Altamese [place] in Treatment: 35 Constitutional Sitting or standing Blood Pressure is within target range for patient.. Pulse regular and within target range for patient.Marland Kitchen Respirations regular, non-labored and within target range.. Temperature is normal and within the target range for the patient.. very frail woman but not in any overt distress. Ears, Nose, Mouth, and Throat there as adherent material on her tongue which could be thrush I didn't see any other adherent material although she wouldn't open her mouth wide enough for me to be certain. Respiratory Respiratory effort is easy and symmetric bilaterally. Rate is normal at rest and on room air.. Cardiovascular she does not appear to be dehydrated. Gastrointestinal (GI) Abdomen is soft and non-distended without masses or tenderness. Bowel sounds active in all quadrants.. Genitourinary (GU) Bladder without fullness, masses or tenderness.. Notes wound exam; patient's wound is on the lower sacrum/coccyx area similar to 3 weeks ago the granulation looks healthy. Tunneling from 4 to 6:00 also looks healthy without any exposed bone there is no erythema Electronic Signature(s) Signed: 09/16/2017 8:14:14 AM By: Baltazar Najjar MD Entered By: Baltazar Najjar on 09/15/2017 12:24:10 [name] ([id number]) -------------------------------------------------------------------------------- Physician Orders Details Patient Name: [name] Date of Service: 09/15/2017 10:15 AM Medical Record Number: [id number] Patient Account Number: [id number] Date of Birth/Sex: June 22, 1923 (83 y.o. Female) Treating RN: Curtis Sites Primary Care Provider: Vonita Moss Other  Clinician: Referring Provider: Vonita Moss Treating Provider/Extender: Altamese [place] in Treatment: 52 Verbal / Phone Orders: No Diagnosis Coding Wound Cleansing Wound #1 Midline Coccyx o Clean wound with Normal Saline. o May Shower, gently pat wound dry prior to applying new dressing. Anesthetic (add to Medication List) Wound #1 Midline Coccyx o Topical Lidocaine 4% cream applied to wound bed prior to debridement (In Clinic Only). Skin Barriers/Peri-Wound Care Wound #1 Midline Coccyx o Skin Prep Primary Wound Dressing Wound #1 Midline Coccyx o Prisma Ag - pack into undermining Secondary Dressing Wound #1 Midline Coccyx o Boardered Foam Dressing - HHRN PLEASE ORDER ALLEVYN [employer] BORDERED FOAM FOR PATIENT (OFF BRANDS IRRITATE HER SKIN) Dressing Change Frequency Wound #1 Midline Coccyx o Change dressing every day. Follow-up Appointments o Return Appointment in 2 weeks. Off-Loading Wound #1 Midline Coccyx o Roho cushion for wheelchair - HHRN please order roho cushion or gel cushion for patients wheelchair - whichever she qualifies for o Turn and reposition every 2 hours Additional Orders / Instructions Wound #1 Midline Coccyx o Increase protein intake. - please add protein supplements to patients diet o Other: - please add vitamin A, vitamin C and zinc supplements to patients diet [address] JAIMARIE, RAPOZO. ([id number]) Wound #1 Midline  Coccyx o Continue Home Health Visits - [facility] o Home Health Nurse may visit PRN to address patientos wound care needs. o FACE TO FACE ENCOUNTER: MEDICARE and MEDICAID PATIENTS: I certify that this patient is under my care and that I had a face-to-face encounter that meets the physician face-to-face encounter requirements with this patient on this date. The encounter with the patient was in whole or in part for the following MEDICAL CONDITION: (primary reason for Home Healthcare) MEDICAL NECESSITY:  I certify, that based on my findings, NURSING services are a medically necessary home health service. HOME BOUND STATUS: I certify that my clinical findings support that this patient is homebound (i.e., Due to illness or injury, pt requires aid of supportive devices such as crutches, cane, wheelchairs, walkers, the use of special transportation or the assistance of another person to leave their place of residence. There is a normal inability to leave the home and doing so requires considerable and taxing effort. Other absences are for medical reasons / religious services and are infrequent or of short duration when for other reasons). o If current dressing causes regression in wound condition, may D/C ordered dressing product/s and apply Normal Saline Moist Dressing daily until next Wound Healing Center / Other MD appointment. Notify Wound Healing Center of regression in wound condition at [phone]. o Please direct any NON-WOUND related issues/requests for orders to patient's Primary Care Physician - Dr Vonita MossMark Crissman Patient Medications Allergies: Phenergan, Sulfa (Sulfonamide Antibiotics) Notifications Medication Indication Start End nystatin thrush 09/15/2017 DOSE oral 100,000 unit/mL suspension - suspension oral. "paint" on affected areas qid Electronic Signature(s) Signed: 09/15/2017 12:29:44 PM By: Baltazar Najjarobson, Kalayna Noy MD Entered By: Baltazar Najjarobson, Moishy Laday on 09/15/2017 12:29:44 Elmarie MainlandSHAW, Jasey W. ([id number]) -------------------------------------------------------------------------------- Problem List Details Patient Name: Elmarie MainlandSHAW, Addylin W. Date of Service: 09/15/2017 10:15 AM Medical Record Number: [id number] Patient Account Number: [id number] Date of Birth/Sex: 1923/02/04 (83 y.o. Female) Treating RN: Curtis Sitesorthy, Joanna Primary Care Provider: Vonita MossRISSMAN, MARK Other Clinician: Referring Provider: Vonita MossRISSMAN, MARK Treating Provider/Extender: Altamese CarolinaOBSON, Onelia Cadmus G Weeks in Treatment: 35 Active  Problems ICD-10 Encounter Code Description Active Date Diagnosis L89.153 Pressure ulcer of sacral region, stage 3 01/07/2017 Yes G20 Parkinson's disease 01/07/2017 Yes L03.312 Cellulitis of back [any part except buttock] 07/28/2017 Yes S81.811D Laceration without foreign body, right lower leg, subsequent 08/11/2017 Yes encounter Inactive Problems Resolved Problems Electronic Signature(s) Signed: 09/16/2017 8:14:14 AM By: Baltazar Najjarobson, Mattison Golay MD Entered By: Baltazar Najjarobson, Kaiyan Luczak on 09/15/2017 12:19:23 Elmarie MainlandSHAW, Fareeda W. ([id number]) -------------------------------------------------------------------------------- Progress Note Details Patient Name: Elmarie MainlandSHAW, Carneshia W. Date of Service: 09/15/2017 10:15 AM Medical Record Number: [id number] Patient Account Number: [id number] Date of Birth/Sex: 1923/02/04 (83 y.o. Female) Treating RN: Curtis Sitesorthy, Joanna Primary Care Provider: Vonita MossRISSMAN, MARK Other Clinician: Referring Provider: Vonita MossRISSMAN, MARK Treating Provider/Extender: Altamese CarolinaOBSON, Tationa Stech G Weeks in Treatment: 35 Subjective History of Present Illness (HPI) 01/07/17 this is a 83 year old woman admitted to the clinic today for review of a pressure ulcer on her lower sacrum. She is referred from her primary physician's office after being seen on 3/22 with a 3 cm pressure area. Her daughter and caretaker accompanied her today state that the area first became obvious about a month ago and his since deteriorated. They have recently got [name] a home health involved and have been using [name] to the wound. They have ordered a pressure relief surface for her mattress. They are turning her religiously. They state that she eats well and they've been forcing fluids on her. She is on a multivitamin. [facility] through [facility] point last albumin I  see was 4.4 on 10/28. The patient has advanced parkinsonism which looks superficially like advanced Parkinson's disease although her daughter tells me she did not ever respond to  Sinemet therefore this may have another pathology with signs of parkinsonism. However I think this is largely a mute point currently. She also has dementia and is nonambulatory. Since this started they have been keeping her in bed and turning her religiously every 2 hours. She lives at home in [place] with her husband with 24/7 care giving 01/13/17 santyl change qd. Still will require further debridement. continue santyl. 01/20/17; patient's wound actually looks some better less adherent necrotic surface. There is actually visible granulation. We're using [name] 01/27/17; better-looking surface but still a lot of necrotic tissue on the base of this wound. The periwound erythema is better than last week we are still using [name]. Her daughter tells [place] that she is still having trouble with the pressure-relief mattress through medical modalities 02/03/18; I had the patient scheduled for a two week followup however her daughter brought her in early concerned for discoloration on 2 areas of the wound circumference. We have bee using santyl 02/10/17;Better looking surface to the wound. Rim appears better suggesting better offloading. Using santyl 02/24/17; change to Silver Collegen last time. Wound appears better. 03/10/17; still using silver collagen religious offloading. Intake is satisfactory per her daughter. Dimension slightly better 03/12/2017 -- Dr. Jannetta Quint patient who had been seen 2 days ago and was doing fairly well. The patient is brought in by her daughter who noticed a new wound just above the previous wound on her sacral area and going on more to the left lateral side. She was very concerned and we asked her to get in for an opinion. 03/17/17; above is noted. The patient has developed a progressive area to the left of her original wound. This seems to this started with a ring of red skin with a more pale interior almost looking fungal. There was a rim of blister through part of the area although  this did not look like zoster. They have been applying triamcinolone that was prescribed last week by Dr. Meyer Russel and the area has a fold to a linear band area which is confluent, erythematous and with obvious epidermal swelling but there is no overt tenderness or crepitus. She has lost some surface epithelium closer to the wound surface itself and now has a more superficial wound in this area and the extending erythema goes towards the left buttock. This is well demarcated between involved in normal skin but once again does not appear to be at all tender. If there is a contact issue here I cannot get the history out of the daughter or the caregiver that are with her. 03/24/17; the patient arrives today with the wound slightly worse slightly more drainage. The bandlike area of erythema that I treated as a possible pineal infection has improved somewhat although proximally is still has confluent erythema without overt tenderness. We have been using silver alginate since the most recent deterioration. To the bandlike degree of erythema we have been using Lotrisone cream 03/31/17; patient arrives today with the wound slightly larger, necrotic surface and surrounding erythema. The bandlike area of erythema that I treated as a possible pineal infection is less swollen but still present I've been using Lotrisone cream on that largely related to the presence of a tinea looking infection when this was first seen. We've been using silver alginate. X-ray that I ordered last week showed no acute  bony abnormalities mild fecal impaction. Lab work showed a comprehensive metabolic panel that was normal including an albumin of 3.8. White count was 9.5 hemoglobin 12.3 differential count normal. C-reactive protein was less than 1 and sedimentation rate and 17. The latter 2 values does not support an ongoing bacterial infection. GENESI, STEFANKO ([id number]) 04/14/17; patient arrives after a 2 week hiatus. Her wound is  not in good condition. Although the base of the wound looks stable she still has an erythematous area that was apparently blistered over the weekend. This again points to the left. As our intake nurse pointed out today this is in the area where the tissue folds together and we may need to prevent try to prevent this. Lab work and x-ray that I did to 3 weeks ago were unremarkable including her albumin nevertheless she is an extremely frail condition physically. We have have been using [name] 04/22/17; I changed her to silver alginate because of the surrounding maceration and moisture last week. The daughter did not like the way the wound looked in the middle of the week and changed her back to [place]. They're putting gauze on top of this. Thinks still using Lotrisone. 05/06/17 on evaluation today patient sacral wound appears to be doing okay and does not seem to be any worse. She is having no significant pain during evaluation today the secondary to mental status she is unable to rate or describe whether she had any pain she was not however flinching. Her daughter states that the wound does appear to be looking better to her. Still we are having difficulty with the skinfold that seems to be closing in on itself at this point. All in all I feel like she is making some good progress in the [name] seems to be the official for her. They do tell me that a refill if we are gonna continue that today. No fevers, chills, nausea, or vomiting noted at this time. 05/13/17 presents today for evaluation concerning her ongoing sacral pressure ulcer. Unfortunately she also has an area of deep tissue injury in the right Ischial region which is starting to show up. The sacral wound also continues to show signs of necrotic tissue overlying and has declined. Overall we really have not seen a significant improvement in the past several months in regard to the sacral wound and now patient is starting to develop a new wound in  the right Ischial region. Obviously this is not trending in the direction that we want to see. No fevers, chills, nausea, or vomiting noted at this time. 05/19/17; I have not seen this wound and almost a month however there is nothing really positive to say about it. Necrotic tissue over the surface which superiorly I think abuts on her sacrum. She has surrounding erythema. I would be surprised if there is not underlying osteomyelitis or soft tissue infection. 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 She apparently eats well per description although I wonder about this looking at her. Lab work I did probably 4 weeks ago however was really quite normal including a serum albumin 05/26/17; culture I did last week grew Escherichia coli and methicillin sensitive staph aureus which should've been well covered by the Augmentin and ciprofloxacin. Indeed the erythema around the wound in the bed of the wound looks somewhat better. Her intake is still satisfactory. They now have a near fluidized bed 06/02/17; they completed the antibiotics last Friday. Using collagen. Daughter still reports eating and drinking well. There  is less visualized erythema around the wound. 06/16/17; large pressure ulcer over her lower sacrum and coccyx. Using [name] to the wound bed. 06/30/17; certainly no change in dimensions of this large stage III wound over her sacrum and coccyx. They've been using [name]. There is no exposed bone. She has a candidal/tinea area in the right inguinal area. Other than that her daughter relates that she is eating and drinking well there changing her positioning to make sure the areas offloaded 07/14/17; no major change in the dimensions of this large stage III wound. Initially a smaller wound that became secondarily infected causing significant tissue breakdown although it is been stable in the last several weeks. Tunneling superiorly at roughly 1:00 no change here either. There is no bone  palpable. Both the patient's daughter and caretaker states that she eats well. I have not rechecked her blood work 07/27/17; patient arrives in clinic today and generally a deteriorated looking state. Mild fever with axillary temperature of 100.4. Daughter reports she has not been eating and drinking well since yesterday. She looks more pale and thin and less responsive. We have been using silver collagen to her wound 08/11/17; since the last time the patient was here things have gone better. Her fever went down and she started eating and drinking again. Culture I did of the wound showed methicillin sensitive staph aureus, Morganella and enterococcus. I only treated her with Keflex which would've not covered the Morganella and enterococcus however the purulent area on the 2:00 side of her wound is a lot better and the bandlike erythema that concern me also was resolved. I'd called the daughter last week to confirm that she was a lot better. She finished the Keflex last Thursday she also suffered a skin tear this morning perhaps while putting on her incontinence brief period is on the lateral aspect of her right leg. Clean wound with the surface epithelium not viable. 08/25/17; last week the patient was noted to have erythema around the wound margin and a slight fever which the patient's daughter says was 57. Our office was contacted by home health however we did not have a space to work the patient in that she went to see her primary physician Dr. Maurice March. She was not febrile during this visit on 08/21/17 there was erythema around the wound similar to last occasion. Dr. Maurice March in reference to my culture from 07/28/17 and put her on doxycycline capsules which they're opening twice a day for 10 days. 09/17/17; patient arrives today with the wound bed looking fairly well granulated. There is undermining from 4 to 6:00 although this seems to of contracted slightly. She does not have obvious infection  although the daughter states there was some darkening of the wound circumference that is not evident today. They state she is eating well. They are concerned about oral thrush MILEIDY, ATKIN. ([id number]) Objective Constitutional Sitting or standing Blood Pressure is within target range for patient.. Pulse regular and within target range for patient.Marland Kitchen Respirations regular, non-labored and within target range.. Temperature is normal and within the target range for the patient.. very frail woman but not in any overt distress. Vitals Time Taken: 11:14 AM, Height: 60 in, Weight: 100 lbs, BMI: 19.5, Temperature: 98.1 F, Pulse: 47 bpm, Respiratory Rate: 14 breaths/min, Blood Pressure: 129/98 mmHg. Ears, Nose, Mouth, and Throat there as adherent material on her tongue which could be thrush I didn't see any other adherent material although she wouldn't open her mouth wide enough for me  to be certain. Respiratory Respiratory effort is easy and symmetric bilaterally. Rate is normal at rest and on room air.. Cardiovascular she does not appear to be dehydrated. Gastrointestinal (GI) Abdomen is soft and non-distended without masses or tenderness. Bowel sounds active in all quadrants.. Genitourinary (GU) Bladder without fullness, masses or tenderness.. General Notes: wound exam; patient's wound is on the lower sacrum/coccyx area similar to 3 weeks ago the granulation looks healthy. Tunneling from 4 to 6:00 also looks healthy without any exposed bone there is no erythema Integumentary (Hair, Skin) Wound #1 status is Open. Original cause of wound was Pressure Injury. The wound is located on the Midline Coccyx. The wound measures 5.3cm length x 5cm width x 0.5cm depth; 20.813cm^2 area and 10.407cm^3 volume. There is muscle and Fat Layer (Subcutaneous Tissue) Exposed exposed. There is undermining starting at 6:00 and ending at 9:00 with a maximum distance of 0.9cm. There is additional undermining and at  1:00 and ending at 3:00 with a maximum distance of 1.6cm. There is a large amount of purulent drainage noted. Foul odor after cleansing was noted. The wound margin is flat and intact. There is large (67-100%) red granulation within the wound bed. There is a small (1-33%) amount of necrotic tissue within the wound bed including Eschar and Adherent Slough. The periwound skin appearance exhibited: Induration, Erythema. The periwound skin appearance did not exhibit: Callus, Crepitus, Excoriation, Rash, Scarring, Dry/Scaly, Maceration, Atrophie Blanche, Cyanosis, Ecchymosis, Hemosiderin Staining, Mottled, Pallor, Rubor. The surrounding wound skin color is noted with erythema which is circumferential. Periwound temperature was noted as No Abnormality. The periwound has tenderness on palpation. Assessment Active Problems Elmarie MainlandSHAW, Acelyn W. ([id number]) ICD-10 L89.153 - Pressure ulcer of sacral region, stage 3 G20 - Parkinson's disease L03.312 - Cellulitis of back [any part except buttock] S81.811D - Laceration without foreign body, right lower leg, subsequent encounter Plan Wound Cleansing: Wound #1 Midline Coccyx: Clean wound with Normal Saline. May Shower, gently pat wound dry prior to applying new dressing. Anesthetic (add to Medication List): Wound #1 Midline Coccyx: Topical Lidocaine 4% cream applied to wound bed prior to debridement (In Clinic Only). Skin Barriers/Peri-Wound Care: Wound #1 Midline Coccyx: Skin Prep Primary Wound Dressing: Wound #1 Midline Coccyx: Prisma Ag - pack into undermining Secondary Dressing: Wound #1 Midline Coccyx: Boardered Foam Dressing - HHRN PLEASE ORDER ALLEVYN LIFE BORDERED FOAM FOR PATIENT (OFF BRANDS IRRITATE HER SKIN) Dressing Change Frequency: Wound #1 Midline Coccyx: Change dressing every day. Follow-up Appointments: Return Appointment in 2 weeks. Off-Loading: Wound #1 Midline Coccyx: Roho cushion for wheelchair - HHRN please order roho  cushion or gel cushion for patients wheelchair - whichever she qualifies for Turn and reposition every 2 hours Additional Orders / Instructions: Wound #1 Midline Coccyx: Increase protein intake. - please add protein supplements to patients diet Other: - please add vitamin A, vitamin C and zinc supplements to patients diet Home Health: Wound #1 Midline Coccyx: [facility] may visit PRN to address patient s wound care needs. FACE TO FACE ENCOUNTER: MEDICARE and MEDICAID PATIENTS: I certify that this patient is under my care and that I had a face-to-face encounter that meets the physician face-to-face encounter requirements with this patient on this date. The encounter with the patient was in whole or in part for the following MEDICAL CONDITION: (primary reason for Home Healthcare) MEDICAL NECESSITY: I certify, that based on my findings, NURSING services are a medically necessary home health service. HOME BOUND STATUS: I  certify that my clinical findings support that this patient is homebound (i.e., Due to illness or injury, pt requires aid of supportive devices such as crutches, cane, wheelchairs, walkers, the use of special transportation or the assistance of another person to leave their place of residence. There is a normal inability to leave the home and doing so requires considerable and taxing effort. Other absences are for medical reasons / religious services and are infrequent or of short duration when for other reasons). If current dressing causes regression in wound condition, may D/C ordered dressing product/s and apply Normal Saline Moist Dressing daily until next Wound Healing Center / Other MD appointment. Notify Wound Healing Center of regression in [place]. ([id number]) wound condition at [phone]. Please direct any NON-WOUND related issues/requests for orders to patient's Primary Care Physician - Dr Vonita Moss The  following medication(s) was prescribed: nystatin oral 100,000 unit/mL suspension suspension oral. "paint" on affected areas qid for thrush starting 09/15/2017 #1 I think her wound appears stable to perhaps slightly improved. Given this patient's overall frailty I think stability is really the best we can hope for here. #2 she may have thrush I will E scribed her some nystatin to pain on her tongue #3 there does not appear to be any overt medical issues Electronic Signature(s) Signed: 09/15/2017 12:30:10 PM By: Baltazar Najjar MD Entered By: Baltazar Najjar on 09/15/2017 12:30:10 [name] ([id number]) -------------------------------------------------------------------------------- SuperBill Details Patient Name: [name] Date of Service: 09/15/2017 Medical Record Number: [id number] Patient Account Number: [id number] Date of Birth/Sex: 1923/08/20 (83 y.o. Female) Treating RN: Curtis Sites Primary Care Provider: Vonita Moss Other Clinician: Referring Provider: Vonita Moss Treating Provider/Extender: Altamese  in Treatment: 35 Diagnosis Coding ICD-10 Codes Code Description L89.153 Pressure ulcer of sacral region, stage 3 G20 Parkinson's disease L03.312 Cellulitis of back [any part except buttock] S81.811D Laceration without foreign body, right lower leg, subsequent encounter Facility Procedures CPT4 Code: [id number] Description: [phone] - WOUND CARE VISIT-LEV 2 EST PT Modifier: Quantity: 1 Physician Procedures CPT4 Code: [id number] Description: 99213 - WC PHYS LEVEL 3 - EST PT ICD-10 Diagnosis Description L89.153 Pressure ulcer of sacral region, stage 3 Modifier: Quantity: 1 Electronic Signature(s) Signed: 09/16/2017 8:14:14 AM By: Baltazar Najjar MD Entered By: Baltazar Najjar on 09/15/2017 12:26:30

## 2017-09-18 NOTE — Telephone Encounter (Signed)
Please advise 

## 2017-09-18 NOTE — Telephone Encounter (Signed)
Please have them cut her metoprolol in 1/2

## 2017-09-18 NOTE — Telephone Encounter (Signed)
Called and let [name] know what Dr. Laural BenesJohnson said. [name] stated that she would let the patient's daughter know.

## 2017-09-30 ENCOUNTER — Encounter: Payer: Medicare Other | Attending: Internal Medicine | Admitting: Internal Medicine

## 2017-09-30 DIAGNOSIS — F0390 Unspecified dementia without behavioral disturbance: Secondary | ICD-10-CM | POA: Diagnosis not present

## 2017-09-30 DIAGNOSIS — X58XXXD Exposure to other specified factors, subsequent encounter: Secondary | ICD-10-CM | POA: Insufficient documentation

## 2017-09-30 DIAGNOSIS — L89153 Pressure ulcer of sacral region, stage 3: Secondary | ICD-10-CM | POA: Diagnosis present

## 2017-09-30 DIAGNOSIS — L03312 Cellulitis of back [any part except buttock]: Secondary | ICD-10-CM | POA: Diagnosis not present

## 2017-09-30 DIAGNOSIS — I10 Essential (primary) hypertension: Secondary | ICD-10-CM | POA: Insufficient documentation

## 2017-09-30 DIAGNOSIS — S81811D Laceration without foreign body, right lower leg, subsequent encounter: Secondary | ICD-10-CM | POA: Diagnosis not present

## 2017-09-30 DIAGNOSIS — G20 Parkinson's disease: Secondary | ICD-10-CM | POA: Insufficient documentation

## 2017-10-01 NOTE — Progress Notes (Signed)
[name], Arilyn W. ([id number]) Visit Report for 09/30/2017 Arrival Information Details Patient Name: [name], [name] Date of Service: 09/30/2017 10:15 AM Medical Record Number: [id number] Patient Account Number: [id number] Date of Birth/Sex: [birth year] (84 y.o. Female) Treating RN: Curtis Sitesorthy, Joanna Primary Care Jonnelle Lawniczak: Vonita MossRISSMAN, MARK Other Clinician: Referring Harshan Kearley: Vonita MossRISSMAN, MARK Treating Gracen Ringwald/Extender: Altamese CarolinaOBSON, MICHAEL G Weeks in Treatment: 38 Visit Information History Since Last Visit Added or deleted any medications: No Patient Arrived: Wheel Chair Any new allergies or adverse reactions: No Arrival Time: 10:09 Had a fall or experienced change in No activities of daily living that may affect Accompanied By: dtr and cg risk of falls: Transfer Assistance: Manual Signs or symptoms of abuse/neglect since No Patient Identification Verified: Yes last visito Secondary Verification Process Completed: Yes Hospitalized since last visit: No Patient Requires Transmission-Based No Has Dressing in Place as Prescribed: Yes Precautions: Pain Present Now: Unable to Patient Has Alerts: No Respond Electronic Signature(s) Signed: 09/30/2017 5:10:34 PM By: Curtis Sitesorthy, Joanna Entered By: Curtis Sitesorthy, Joanna on 09/30/2017 10:13:01 [name], Sylwia W. ([id number]) -------------------------------------------------------------------------------- Clinic Level of Care Assessment Details Patient Name: [name], [name] Date of Service: 09/30/2017 10:15 AM Medical Record Number: [id number] Patient Account Number: [id number] Date of Birth/Sex: [birth year] (84 y.o. Female) Treating RN: Curtis Sitesorthy, Joanna Primary Care Elani Delph: Vonita MossRISSMAN, MARK Other Clinician: Referring Eamon Tantillo: Vonita MossRISSMAN, MARK Treating Idolina Mantell/Extender: Altamese CarolinaOBSON, MICHAEL G Weeks in Treatment: 38 Clinic Level of Care Assessment Items TOOL 4 Quantity Score []  - Use when only an EandM is performed on FOLLOW-UP visit 0 ASSESSMENTS - Nursing Assessment /  Reassessment X - Reassessment of Co-morbidities (includes updates in patient status) 1 10 X- 1 5 Reassessment of Adherence to Treatment Plan ASSESSMENTS - Wound and Skin Assessment / Reassessment X - Simple Wound Assessment / Reassessment - one wound 1 5 []  - 0 Complex Wound Assessment / Reassessment - multiple wounds []  - 0 Dermatologic / Skin Assessment (not related to wound area) ASSESSMENTS - Focused Assessment []  - Circumferential Edema Measurements - multi extremities 0 []  - 0 Nutritional Assessment / Counseling / Intervention []  - 0 Lower Extremity Assessment (monofilament, tuning fork, pulses) []  - 0 Peripheral Arterial Disease Assessment (using hand held doppler) ASSESSMENTS - Ostomy and/or Continence Assessment and Care []  - Incontinence Assessment and Management 0 []  - 0 Ostomy Care Assessment and Management (repouching, etc.) PROCESS - Coordination of Care X - Simple Patient / Family Education for ongoing care 1 15 []  - 0 Complex (extensive) Patient / Family Education for ongoing care []  - 0 Staff obtains ChiropractorConsents, Records, Test Results / Process Orders []  - 0 Staff telephones HHA, Nursing Homes / Clarify orders / etc []  - 0 Routine Transfer to another Facility (non-emergent condition) []  - 0 Routine Hospital Admission (non-emergent condition) []  - 0 New Admissions / Manufacturing engineernsurance Authorizations / Ordering NPWT, Apligraf, etc. []  - 0 Emergency Hospital Admission (emergent condition) X- 1 10 Simple Discharge Coordination [name], [name] ([id number]) []  - 0 Complex (extensive) Discharge Coordination PROCESS - Special Needs []  - Pediatric / Minor Patient Management 0 []  - 0 Isolation Patient Management []  - 0 Hearing / Language / Visual special needs []  - 0 Assessment of Community assistance (transportation, D/C planning, etc.) []  - 0 Additional assistance / Altered mentation []  - 0 Support Surface(s) Assessment (bed, cushion, seat, etc.) INTERVENTIONS -  Wound Cleansing / Measurement X - Simple Wound Cleansing - one wound 1 5 []  - 0 Complex Wound Cleansing - multiple wounds X- 1 5 Wound Imaging (photographs - any number of  wounds) []  - 0 Wound Tracing (instead of photographs) X- 1 5 Simple Wound Measurement - one wound []  - 0 Complex Wound Measurement - multiple wounds INTERVENTIONS - Wound Dressings X - Small Wound Dressing one or multiple wounds 1 10 []  - 0 Medium Wound Dressing one or multiple wounds []  - 0 Large Wound Dressing one or multiple wounds []  - 0 Application of Medications - topical []  - 0 Application of Medications - injection INTERVENTIONS - Miscellaneous []  - External ear exam 0 []  - 0 Specimen Collection (cultures, biopsies, blood, body fluids, etc.) []  - 0 Specimen(s) / Culture(s) sent or taken to Lab for analysis X- 1 10 Patient Transfer (multiple staff / Nurse, adult / Similar devices) []  - 0 Simple Staple / Suture removal (25 or less) []  - 0 Complex Staple / Suture removal (26 or more) []  - 0 Hypo / Hyperglycemic Management (close monitor of Blood Glucose) []  - 0 Ankle / Brachial Index (ABI) - do not check if billed separately X- 1 5 Vital Signs [name], [name] ([id number]) Has the patient been seen at the hospital within the last three years: Yes Total Score: 85 Level Of Care: New/Established - Level 3 Electronic Signature(s) Signed: 09/30/2017 5:10:34 PM By: Curtis Sites Entered By: Curtis Sites on 09/30/2017 11:39:33 [name] ([id number]) -------------------------------------------------------------------------------- Encounter Discharge Information Details Patient Name: [name] Date of Service: 09/30/2017 10:15 AM Medical Record Number: [id number] Patient Account Number: [id number] Date of Birth/Sex: 07/10/1923 (84 y.o. Female) Treating RN: Curtis Sites Primary Care Ural Acree: Vonita Moss Other Clinician: Referring Duong Haydel: Vonita Moss Treating  Nyeem Stoke/Extender: Altamese [place] in Treatment: 69 Encounter Discharge Information Items Discharge Pain Level: 0 Discharge Condition: Stable Ambulatory Status: Wheelchair Discharge Destination: Home Transportation: Private Auto Accompanied By: dtr Schedule Follow-up Appointment: Yes Medication Reconciliation completed and No provided to Patient/Care Isolde Skaff: Provided on Clinical Summary of Care: 09/30/2017 Form Type Recipient Paper Patient ES Electronic Signature(s) Signed: 09/30/2017 11:38:38 AM By: Curtis Sites Entered By: Curtis Sites on 09/30/2017 11:38:38 [name] ([id number]) -------------------------------------------------------------------------------- Multi Wound Chart Details Patient Name: [name] Date of Service: 09/30/2017 10:15 AM Medical Record Number: [id number] Patient Account Number: [id number] Date of Birth/Sex: May 06, 1923 (84 y.o. Female) Treating RN: Curtis Sites Primary Care Oisin Yoakum: Vonita Moss Other Clinician: Referring Alysiana Ethridge: Vonita Moss Treating Chesney Klimaszewski/Extender: Altamese [place] in Treatment: 38 Vital Signs Height(in): 60 Pulse(bpm): 55 Weight(lbs): 100 Blood Pressure(mmHg): 142/74 Body Mass Index(BMI): 20 Temperature(F): 97.7 Respiratory Rate 14 (breaths/min): Photos: [N/A:N/A] Wound Location: Coccyx - Midline N/A N/A Wounding Event: Pressure Injury N/A N/A Primary Etiology: Pressure Ulcer N/A N/A Comorbid History: Anemia, Hypertension, N/A N/A Dementia Date Acquired: 12/01/2016 N/A N/A Weeks of Treatment: 38 N/A N/A Wound Status: Open N/A N/A Measurements L x W x D 5.2x5.5x0.5 N/A N/A (cm) Area (cm) : 22.462 N/A N/A Volume (cm) : 11.231 N/A N/A % Reduction in Area: -440.60% N/A N/A % Reduction in Volume: -575.80% N/A N/A Starting Position 1 6 (o'clock): Ending Position 1 8 (o'clock): Maximum Distance 1 (cm): 0.8 Starting Position 2 12 (o'clock): Ending Position 2  2 (o'clock): Maximum Distance 2 (cm): 2 Undermining: Yes N/A N/A Classification: Category/Stage IV N/A N/A Exudate Amount: Large N/A N/A Exudate Type: Purulent N/A N/A Exudate Color: yellow, brown, green N/A N/A [name], HARWICK. ([id number]) Foul Odor After Cleansing: Yes N/A N/A Odor Anticipated Due to No N/A N/A Product Use: Wound Margin: Flat and Intact N/A N/A Granulation Amount: Large (67-100%) N/A N/A Granulation Quality: Red  N/A N/A Necrotic Amount: Small (1-33%) N/A N/A Necrotic Tissue: Eschar, Adherent Slough N/A N/A Exposed Structures: Fat Layer (Subcutaneous N/A N/A Tissue) Exposed: Yes Muscle: Yes Fascia: No Tendon: No Joint: No Bone: No Epithelialization: None N/A N/A Periwound Skin Texture: Induration: Yes N/A N/A Excoriation: No Callus: No Crepitus: No Rash: No Scarring: No Periwound Skin Moisture: Maceration: No N/A N/A Dry/Scaly: No Periwound Skin Color: Erythema: Yes N/A N/A Atrophie Blanche: No Cyanosis: No Ecchymosis: No Hemosiderin Staining: No Mottled: No Pallor: No Rubor: No Erythema Location: Circumferential N/A N/A Temperature: No Abnormality N/A N/A Tenderness on Palpation: Yes N/A N/A Wound Preparation: Ulcer Cleansing: N/A N/A Rinsed/Irrigated with Saline Topical Anesthetic Applied: Other: lidocaine 4% Treatment Notes Electronic Signature(s) Signed: 09/30/2017 5:48:27 PM By: Baltazar Najjar MD Entered By: Baltazar Najjar on 09/30/2017 11:09:06 [name] ([id number]) -------------------------------------------------------------------------------- Multi-Disciplinary Care Plan Details Patient Name: [name] Date of Service: 09/30/2017 10:15 AM Medical Record Number: [id number] Patient Account Number: [id number] Date of Birth/Sex: May 25, 1923 (84 y.o. Female) Treating RN: Curtis Sites Primary Care Angi Goodell: Vonita Moss Other Clinician: Referring Mikita Lesmeister: Vonita Moss Treating Ingram Onnen/Extender: Altamese [place] in Treatment: 70 Active Inactive ` Abuse / Safety / Falls / Self Care Management Nursing Diagnoses: Impaired physical mobility Potential for falls Goals: Patient will remain injury free Date Initiated: 01/07/2017 Target Resolution Date: 04/03/2017 Goal Status: Active Interventions: Assess fall risk on admission and as needed Notes: ` Nutrition Nursing Diagnoses: Potential for alteratiion in Nutrition/Potential for imbalanced nutrition Goals: Patient/caregiver agrees to and verbalizes understanding of need to use nutritional supplements and/or vitamins as prescribed Date Initiated: 01/07/2017 Target Resolution Date: 04/03/2017 Goal Status: Active Interventions: Assess patient nutrition upon admission and as needed per policy Notes: ` Orientation to the Wound Care Program Nursing Diagnoses: Knowledge deficit related to the wound healing center program Goals: Patient/caregiver will verbalize understanding of the Wound Healing Center Program Date Initiated: 01/07/2017 Target Resolution Date: 04/03/2017 Goal Status: Active [name], [name] ([id number]) Interventions: Provide education on orientation to the wound center Notes: ` Pressure Nursing Diagnoses: Knowledge deficit related to causes and risk factors for pressure ulcer development Goals: Patient will remain free from development of additional pressure ulcers Date Initiated: 01/07/2017 Target Resolution Date: 04/03/2017 Goal Status: Active Interventions: Assess potential for pressure ulcer upon admission and as needed Notes: ` Wound/Skin Impairment Nursing Diagnoses: Impaired tissue integrity Goals: Patient/caregiver will verbalize understanding of skin care regimen Date Initiated: 01/07/2017 Target Resolution Date: 04/03/2017 Goal Status: Active Ulcer/skin breakdown will have a volume reduction of 30% by week 4 Date Initiated: 01/07/2017 Target Resolution Date: 04/03/2017 Goal Status: Active Ulcer/skin  breakdown will have a volume reduction of 50% by week 8 Date Initiated: 01/07/2017 Target Resolution Date: 04/03/2017 Goal Status: Active Ulcer/skin breakdown will have a volume reduction of 80% by week 12 Date Initiated: 01/07/2017 Target Resolution Date: 04/03/2017 Goal Status: Active Ulcer/skin breakdown will heal within 14 weeks Date Initiated: 01/07/2017 Target Resolution Date: 04/03/2017 Goal Status: Active Interventions: Assess patient/caregiver ability to obtain necessary supplies Assess patient/caregiver ability to perform ulcer/skin care regimen upon admission and as needed Assess ulceration(s) every visit Notes: Electronic Signature(s) [name], [name] ([id number]) Signed: 09/30/2017 5:10:34 PM By: Curtis Sites Entered By: Curtis Sites on 09/30/2017 10:29:40 [name] ([id number]) -------------------------------------------------------------------------------- Pain Assessment Details Patient Name: [name] Date of Service: 09/30/2017 10:15 AM Medical Record Number: [id number] Patient Account Number: [id number] Date of Birth/Sex: 1922-10-06 (84 y.o. Female) Treating RN: Curtis Sites Primary Care Fama Muenchow: Vonita Moss Other Clinician: Referring  Takeru Bose: Vonita Moss Treating Mellony Danziger/Extender: Altamese [place] in Treatment: 53 Active Problems Location of Pain Severity and Description of Pain Patient Has Paino Patient Unable to Respond Site Locations Pain Management and Medication Current Pain Management: Electronic Signature(s) Signed: 09/30/2017 5:10:34 PM By: Curtis Sites Entered By: Curtis Sites on 09/30/2017 10:13:14 [name] ([id number]) -------------------------------------------------------------------------------- Patient/Caregiver Education Details Patient Name: [name] Date of Service: 09/30/2017 10:15 AM Medical Record Number: [id number] Patient Account Number: [id number] Date of Birth/Gender: 01-Nov-1922 (84 y.o.  Female) Treating RN: Curtis Sites Primary Care Physician: Vonita Moss Other Clinician: Referring Physician: Vonita Moss Treating Physician/Extender: Altamese [place] in Treatment: 5 Education Assessment Education Provided To: Caregiver Education Topics Provided Wound/Skin Impairment: Handouts: Other: wound care to continue as ordered Methods: Demonstration, Explain/Verbal Responses: State content correctly Electronic Signature(s) Signed: 09/30/2017 5:10:34 PM By: Curtis Sites Entered By: Curtis Sites on 09/30/2017 11:38:56 [name] ([id number]) -------------------------------------------------------------------------------- Wound Assessment Details Patient Name: [name] Date of Service: 09/30/2017 10:15 AM Medical Record Number: [id number] Patient Account Number: [id number] Date of Birth/Sex: 15-Oct-1922 (84 y.o. Female) Treating RN: Curtis Sites Primary Care Neytiri Asche: Vonita Moss Other Clinician: Referring Myrl Bynum: Vonita Moss Treating Zoei Amison/Extender: Altamese [address] in Treatment: 38 Wound Status Wound Number: 1 Primary Etiology: Pressure Ulcer Wound Location: Coccyx - Midline Wound Status: Open Wounding Event: Pressure Injury Comorbid History: Anemia, Hypertension, Dementia Date Acquired: 12/01/2016 Weeks Of Treatment: 38 Clustered Wound: No Photos Photo Uploaded By: Curtis Sites on 09/30/2017 10:50:42 Wound Measurements Length: (cm) 5.2 Width: (cm) 5.5 Depth: (cm) 0.5 Area: (cm) 22.462 Volume: (cm) 11.231 % Reduction in Area: -440.6% % Reduction in Volume: -575.8% Epithelialization: None Tunneling: No Undermining: Yes Location 1 Starting Position (o'clock): 6 Ending Position (o'clock): 8 Maximum Distance: (cm) 0.8 Location 2 Starting Position (o'clock): 12 Ending Position (o'clock): 2 Maximum Distance: (cm) 2 Wound Description Classification: Category/Stage IV Foul O Wound Margin: Flat and Intact  Due to Exudate Amount: Large Slough Exudate Type: Purulent Exudate Color: yellow, brown, green dor After Cleansing: Yes Product Use: No /Fibrino Yes Wound Bed [name], [name] ([id number]) Granulation Amount: Large (67-100%) Exposed Structure Granulation Quality: Red Fascia Exposed: No Necrotic Amount: Small (1-33%) Fat Layer (Subcutaneous Tissue) Exposed: Yes Necrotic Quality: Eschar, Adherent Slough Tendon Exposed: No Muscle Exposed: Yes Necrosis of Muscle: No Joint Exposed: No Bone Exposed: No Periwound Skin Texture Texture Color No Abnormalities Noted: No No Abnormalities Noted: No Callus: No Atrophie Blanche: No Crepitus: No Cyanosis: No Excoriation: No Ecchymosis: No Induration: Yes Erythema: Yes Rash: No Erythema Location: Circumferential Scarring: No Hemosiderin Staining: No Mottled: No Moisture Pallor: No No Abnormalities Noted: No Rubor: No Dry / Scaly: No Maceration: No Temperature / Pain Temperature: No Abnormality Tenderness on Palpation: Yes Wound Preparation Ulcer Cleansing: Rinsed/Irrigated with Saline Topical Anesthetic Applied: Other: lidocaine 4%, Treatment Notes Wound #1 (Midline Coccyx) 1. Cleansed with: Clean wound with Normal Saline 4. Dressing Applied: Prisma Ag 5. Secondary Dressing Applied Bordered Foam Dressing Dry Gauze Electronic Signature(s) Signed: 09/30/2017 5:10:34 PM By: Curtis Sites Entered By: Curtis Sites on 09/30/2017 10:23:50 [name] ([id number]) -------------------------------------------------------------------------------- Vitals Details Patient Name: [name] Date of Service: 09/30/2017 10:15 AM Medical Record Number: [id number] Patient Account Number: [id number] Date of Birth/Sex: June 13, 1923 (84 y.o. Female) Treating RN: Curtis Sites Primary Care Mccauley Diehl: Vonita Moss Other Clinician: Referring Dezirea Mccollister: Vonita Moss Treating Afnan Emberton/Extender: Altamese [place] in  Treatment: 38 Vital Signs Time Taken: 10:10 Temperature (F): 97.7 Height (in): 60 Pulse (bpm): 55  Weight (lbs): 100 Respiratory Rate (breaths/min): 14 Body Mass Index (BMI): 19.5 Blood Pressure (mmHg): 142/74 Reference Range: 80 - 120 mg / dl Electronic Signature(s) Signed: 09/30/2017 5:10:34 PM By: Curtis Sites Entered By: Curtis Sites on 09/30/2017 10:16:17

## 2017-10-01 NOTE — Progress Notes (Signed)
NANCYANN, COTTERMAN ([id number]) Visit Report for 09/30/2017 HPI Details Patient Name: [name], [name] Date of Service: 09/30/2017 10:15 AM Medical Record Number: [id number] Patient Account Number: [id number] Date of Birth/Sex: [birth year] (84 y.o. Female) Treating RN: Curtis Sites Primary Care Provider: Vonita Moss Other Clinician: Referring Provider: Vonita Moss Treating Provider/Extender: Altamese [place] in Treatment: 6 History of Present Illness HPI Description: 01/07/17 this is a 84 year old woman admitted to the clinic today for review of a pressure ulcer on her lower sacrum. She is referred from her primary physician's office after being seen on 3/22 with a 3 cm pressure area. Her daughter and caretaker accompanied her today state that the area first became obvious about a month ago and his since deteriorated. They have recently got [name] a home health involved and have been using [name] to the wound. They have ordered a pressure relief surface for her mattress. They are turning her religiously. They state that she eats well and they've been forcing fluids on her. She is on a multivitamin. Looking through [facility] point last albumin I see was 4.4 on 10/28. The patient has advanced parkinsonism which looks superficially like advanced Parkinson's disease although her daughter tells me she did not ever respond to Sinemet therefore this may have another pathology with signs of parkinsonism. However I think this is largely a mute point currently. She also has dementia and is nonambulatory. Since this started they have been keeping her in bed and turning her religiously every 2 hours. She lives at home in [place] with her husband with 24/7 care giving 01/13/17 santyl change qd. Still will require further debridement. continue santyl. 01/20/17; patient's wound actually looks some better less adherent necrotic surface. There is actually visible granulation. We're using  [name] 01/27/17; better-looking surface but still a lot of necrotic tissue on the base of this wound. The periwound erythema is better than last week we are still using [name]. Her daughter tells [place] that she is still having trouble with the pressure-relief mattress through medical modalities 02/03/18; I had the patient scheduled for a two week followup however her daughter brought her in early concerned for discoloration on 2 areas of the wound circumference. We have bee using santyl 02/10/17;Better looking surface to the wound. Rim appears better suggesting better offloading. Using santyl 02/24/17; change to Silver Collegen last time. Wound appears better. 03/10/17; still using silver collagen religious offloading. Intake is satisfactory per her daughter. Dimension slightly better 03/12/2017 -- Dr. Jannetta Quint patient who had been seen 2 days ago and was doing fairly well. The patient is brought in by her daughter who noticed a new wound just above the previous wound on her sacral area and going on more to the left lateral side. She was very concerned and we asked her to get in for an opinion. 03/17/17; above is noted. The patient has developed a progressive area to the left of her original wound. This seems to this started with a ring of red skin with a more pale interior almost looking fungal. There was a rim of blister through part of the area although this did not look like zoster. They have been applying triamcinolone that was prescribed last week by Dr. Meyer Russel and the area has a fold to a linear band area which is confluent, erythematous and with obvious epidermal swelling but there is no overt tenderness or crepitus. She has lost some surface epithelium closer to the wound surface itself and now has a more superficial wound in  this area and the extending erythema goes towards the left buttock. This is well demarcated between involved in normal skin but once again does not appear to be at all tender.  If there is a contact issue here I cannot get the history out of the daughter or the caregiver that are with her. 03/24/17; the patient arrives today with the wound slightly worse slightly more drainage. The bandlike area of erythema that I treated as a possible pineal infection has improved somewhat although proximally is still has confluent erythema without overt tenderness. We have been using silver alginate since the most recent deterioration. To the bandlike degree of erythema we have been using Lotrisone cream 03/31/17; patient arrives today with the wound slightly larger, necrotic surface and surrounding erythema. The bandlike area of erythema that I treated as a possible pineal infection is less swollen but still present I've been using Lotrisone cream on that largely related to the presence of a tinea looking infection when this was first seen. We've been using silver alginate. X-ray that I ordered last week showed no acute bony abnormalities mild fecal impaction. Lab work showed a comprehensive metabolic panel that was normal including an albumin of 3.8. White count was 9.5 hemoglobin 12.3 differential count normal. Zapf, Qunisha W. ([id number]) C-reactive protein was less than 1 and sedimentation rate and 17. The latter 2 values does not support an ongoing bacterial infection. 04/14/17; patient arrives after a 2 week hiatus. Her wound is not in good condition. Although the base of the wound looks stable she still has an erythematous area that was apparently blistered over the weekend. This again points to the left. As our intake nurse pointed out today this is in the area where the tissue folds together and we may need to prevent try to prevent this. Lab work and x-ray that I did to 3 weeks ago were unremarkable including her albumin nevertheless she is an extremely frail condition physically. We have have been using [name] 04/22/17; I changed her to silver alginate because of the  surrounding maceration and moisture last week. The daughter did not like the way the wound looked in the middle of the week and changed her back to Andres. They're putting gauze on top of this. Thinks still using Lotrisone. 05/06/17 on evaluation today patient sacral wound appears to be doing okay and does not seem to be any worse. She is having no significant pain during evaluation today the secondary to mental status she is unable to rate or describe whether she had any pain she was not however flinching. Her daughter states that the wound does appear to be looking better to her. Still we are having difficulty with the skinfold that seems to be closing in on itself at this point. All in all I feel like she is making some good progress in the [name] seems to be the official for her. They do tell me that a refill if we are gonna continue that today. No fevers, chills, nausea, or vomiting noted at this time. 05/13/17 presents today for evaluation concerning her ongoing sacral pressure ulcer. Unfortunately she also has an area of deep tissue injury in the right Ischial region which is starting to show up. The sacral wound also continues to show signs of necrotic tissue overlying and has declined. Overall we really have not seen a significant improvement in the past several months in regard to the sacral wound and now patient is starting to develop a new wound in the right  Ischial region. Obviously this is not trending in the direction that we want to see. No fevers, chills, nausea, or vomiting noted at this time. 05/19/17; I have not seen this wound and almost a month however there is nothing really positive to say about it. Necrotic tissue over the surface which superiorly I think abuts on her sacrum. She has surrounding erythema. I would be surprised if there is not underlying osteomyelitis or soft tissue infection. 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 She apparently eats well per  description although I wonder about this looking at her. Lab work I did probably 4 weeks ago however was really quite normal including a serum albumin 05/26/17; culture I did last week grew Escherichia coli and methicillin sensitive staph aureus which should've been well covered by the Augmentin and ciprofloxacin. Indeed the erythema around the wound in the bed of the wound looks somewhat better. Her intake is still satisfactory. They now have a near fluidized bed 06/02/17; they completed the antibiotics last Friday. Using collagen. Daughter still reports eating and drinking well. There is less visualized erythema around the wound. 06/16/17; large pressure ulcer over her lower sacrum and coccyx. Using [name] to the wound bed. 06/30/17; certainly no change in dimensions of this large stage III wound over her sacrum and coccyx. They've been using [name]. There is no exposed bone. She has a candidal/tinea area in the right inguinal area. Other than that her daughter relates that she is eating and drinking well there changing her positioning to make sure the areas offloaded 07/14/17; no major change in the dimensions of this large stage III wound. Initially a smaller wound that became secondarily infected causing significant tissue breakdown although it is been stable in the last several weeks. Tunneling superiorly at roughly 1:00 no change here either. There is no bone palpable. Both the patient's daughter and caretaker states that she eats well. I have not rechecked her blood work 07/27/17; patient arrives in clinic today and generally a deteriorated looking state. Mild fever with axillary temperature of 100.4. Daughter reports she has not been eating and drinking well since yesterday. She looks more pale and thin and less responsive. We have been using silver collagen to her wound 08/11/17; since the last time the patient was here things have gone better. Her fever went down and she started eating  and drinking again. Culture I did of the wound showed methicillin sensitive staph aureus, Morganella and enterococcus. I only treated her with Keflex which would've not covered the Morganella and enterococcus however the purulent area on the 2:00 side of her wound is a lot better and the bandlike erythema that concern me also was resolved. I'd called the daughter last week to confirm that she was a lot better. She finished the Keflex last Thursday she also suffered a skin tear this morning perhaps while putting on her incontinence brief period is on the lateral aspect of her right leg. Clean wound with the surface epithelium not viable. 08/25/17; last week the patient was noted to have erythema around the wound margin and a slight fever which the patient's daughter says was 48. Our office was contacted by home health however we did not have a space to work the patient in that she went to see her primary physician Dr. Maurice March. She was not febrile during this visit on 08/21/17 there was erythema around the wound similar to last occasion. Dr. Maurice March in reference to my culture from 07/28/17 and put her  on doxycycline capsules which they're opening twice a day for 10 days. 09/17/17; patient arrives today with the wound bed looking fairly well granulated. There is undermining from 4 to 6:00 although this seems to of contracted slightly. She does not have obvious infection although the daughter states there was some darkening of the wound circumference that is not evident today. They state she is eating well. They are concerned Elmarie MainlandSHAW, Debara W. ([id number]) about oral thrush 09/30/16; very fibrin looking granulated wound bed. Her undermining from 4 to 6:00 is about the same but also appears to be well granulated. She has rolled edges of senescent tissue from roughly 7 to 12:00. There is no evidence of infection Electronic Signature(s) Signed: 09/30/2017 5:48:27 PM By: Baltazar Najjarobson, Val Farnam MD Entered By: Baltazar Najjarobson,  Shaylea Ucci on 09/30/2017 11:10:38 Elmarie MainlandSHAW, Jenee W. ([id number]) -------------------------------------------------------------------------------- Physical Exam Details Patient Name: Elmarie MainlandSHAW, Nyoka W. Date of Service: 09/30/2017 10:15 AM Medical Record Number: [id number] Patient Account Number: [id number] Date of Birth/Sex: 1923/07/26 (84 y.o. Female) Treating RN: Curtis Sitesorthy, Joanna Primary Care Provider: Vonita MossRISSMAN, MARK Other Clinician: Referring Provider: Vonita MossRISSMAN, MARK Treating Provider/Extender: Altamese CarolinaOBSON, Lesha Jager G Weeks in Treatment: 5638 Constitutional Patient is hypertensive.. Pulse regular and within target range for patient.Marland Kitchen. Respirations regular, non-labored and within target range.. Temperature is normal and within the target range for the patient.Marland Kitchen. appears in no distress. No obvious systemic problems. Eyes Conjunctivae clear. No discharge. Respiratory Respiratory effort is easy and symmetric bilaterally. Rate is normal at rest and on room air.. Gastrointestinal (GI) Abdomen is soft and non-distended without masses or tenderness. Bowel sounds active in all quadrants.. Integumentary (Hair, Skin) no rashes seen. No erythema around the wound or palpable crepitus. Psychiatric advanced dementia. Notes wound exam; patient's wound is on the lower sacrum/coccyx. Dimensions not better than last visit however the granulation continuesto look better. She has the tunneled area from 4 to 6:00 however there is no palpable bone. Large areas of rolled senescent edges from 7 to 12:00 would preclude epithelialization although at this point I am reluctant to debride these unless we see advancing epithelialization from the other edges Electronic Signature(s) Signed: 09/30/2017 5:48:27 PM By: Baltazar Najjarobson, Antanisha Mohs MD Entered By: Baltazar Najjarobson, Peachie Barkalow on 09/30/2017 11:14:04 Elmarie MainlandSHAW, Matrice W. ([id number]) -------------------------------------------------------------------------------- Physician Orders Details Patient Name:  Elmarie MainlandSHAW, Sheri W. Date of Service: 09/30/2017 10:15 AM Medical Record Number: [id number] Patient Account Number: [id number] Date of Birth/Sex: 1923/07/26 (84 y.o. Female) Treating RN: Curtis Sitesorthy, Joanna Primary Care Provider: Vonita MossRISSMAN, MARK Other Clinician: Referring Provider: Vonita MossRISSMAN, MARK Treating Provider/Extender: Altamese CarolinaOBSON, Leoncio Hansen G Weeks in Treatment: 6438 Verbal / Phone Orders: No Diagnosis Coding Wound Cleansing Wound #1 Midline Coccyx o Clean wound with Normal Saline. o May Shower, gently pat wound dry prior to applying new dressing. Anesthetic (add to Medication List) Wound #1 Midline Coccyx o Topical Lidocaine 4% cream applied to wound bed prior to debridement (In Clinic Only). Skin Barriers/Peri-Wound Care Wound #1 Midline Coccyx o Skin Prep Primary Wound Dressing Wound #1 Midline Coccyx o Prisma Ag - pack into undermining Secondary Dressing Wound #1 Midline Coccyx o Boardered Foam Dressing - HHRN PLEASE ORDER [employer] BORDERED FOAM FOR PATIENT (OFF BRANDS IRRITATE HER SKIN) Dressing Change Frequency Wound #1 Midline Coccyx o Change dressing every day. Follow-up Appointments o Return Appointment in 2 weeks. Off-Loading Wound #1 Midline Coccyx o Roho cushion for wheelchair - HHRN please order roho cushion or gel cushion for patients wheelchair - whichever she qualifies for o Turn and reposition every 2 hours Additional Orders / Instructions Wound #1 Midline Coccyx   o Increase protein intake. - please add protein supplements to patients diet o Other: - please add vitamin A, vitamin C and zinc supplements to patients diet [address] MARCHA, LICKLIDER ([id number]) Wound #1 Midline Coccyx o Continue Home Health Visits - [facility] o Home Health Nurse may visit PRN to address patientos wound care needs. o FACE TO FACE ENCOUNTER: MEDICARE and MEDICAID PATIENTS: I certify that this patient is under my care and that I had a face-to-face encounter  that meets the physician face-to-face encounter requirements with this patient on this date. The encounter with the patient was in whole or in part for the following MEDICAL CONDITION: (primary reason for Home Healthcare) MEDICAL NECESSITY: I certify, that based on my findings, NURSING services are a medically necessary home health service. HOME BOUND STATUS: I certify that my clinical findings support that this patient is homebound (i.e., Due to illness or injury, pt requires aid of supportive devices such as crutches, cane, wheelchairs, walkers, the use of special transportation or the assistance of another person to leave their place of residence. There is a normal inability to leave the home and doing so requires considerable and taxing effort. Other absences are for medical reasons / religious services and are infrequent or of short duration when for other reasons). o If current dressing causes regression in wound condition, may D/C ordered dressing product/s and apply Normal Saline Moist Dressing daily until next Wound Healing Center / Other MD appointment. Notify Wound Healing Center of regression in wound condition at [phone]. o Please direct any NON-WOUND related issues/requests for orders to patient's Primary Care Physician - Dr Vonita Moss Electronic Signature(s) Signed: 09/30/2017 5:10:34 PM By: Curtis Sites Signed: 09/30/2017 5:48:27 PM By: Baltazar Najjar MD Entered By: Curtis Sites on 09/30/2017 10:32:29 [name] ([id number]) -------------------------------------------------------------------------------- Problem List Details Patient Name: [name] Date of Service: 09/30/2017 10:15 AM Medical Record Number: [id number] Patient Account Number: [id number] Date of Birth/Sex: [birth year] (84 y.o. Female) Treating RN: Curtis Sites Primary Care Provider: Vonita Moss Other Clinician: Referring Provider: Vonita Moss Treating Provider/Extender: Altamese [place] in Treatment: 59 Active Problems ICD-10 Encounter Code Description Active Date Diagnosis L89.153 Pressure ulcer of sacral region, stage 3 01/07/2017 Yes G20 Parkinson's disease 01/07/2017 Yes L03.312 Cellulitis of back [any part except buttock] 07/28/2017 Yes S81.811D Laceration without foreign body, right lower leg, subsequent 08/11/2017 Yes encounter Inactive Problems Resolved Problems Electronic Signature(s) Signed: 09/30/2017 5:48:27 PM By: Baltazar Najjar MD Entered By: Baltazar Najjar on 09/30/2017 11:08:55 [name] ([id number]) -------------------------------------------------------------------------------- Progress Note Details Patient Name: [name] Date of Service: 09/30/2017 10:15 AM Medical Record Number: [id number] Patient Account Number: [id number] Date of Birth/Sex: [birth year] (84 y.o. Female) Treating RN: Curtis Sites Primary Care Provider: Vonita Moss Other Clinician: Referring Provider: Vonita Moss Treating Provider/Extender: Altamese [place] in Treatment: 38 Subjective History of Present Illness (HPI) 01/07/17 this is a 84 year old woman admitted to the clinic today for review of a pressure ulcer on her lower sacrum. She is referred from her primary physician's office after being seen on 3/22 with a 3 cm pressure area. Her daughter and caretaker accompanied her today state that the area first became obvious about a month ago and his since deteriorated. They have recently got [name] a home health involved and have been using [name] to the wound. They have ordered a pressure relief surface for her mattress. They are turning her religiously. They state that she eats well and they've been forcing fluids  on her. She is on a multivitamin. Looking through [facility] point last albumin I see was 4.4 on 10/28. The patient has advanced parkinsonism which looks superficially like advanced Parkinson's disease although her daughter  tells me she did not ever respond to Sinemet therefore this may have another pathology with signs of parkinsonism. However I think this is largely a mute point currently. She also has dementia and is nonambulatory. Since this started they have been keeping her in bed and turning her religiously every 2 hours. She lives at home in [place] with her husband with 24/7 care giving 01/13/17 santyl change qd. Still will require further debridement. continue santyl. 01/20/17; patient's wound actually looks some better less adherent necrotic surface. There is actually visible granulation. We're using [name] 01/27/17; better-looking surface but still a lot of necrotic tissue on the base of this wound. The periwound erythema is better than last week we are still using [name]. Her daughter tells [place] that she is still having trouble with the pressure-relief mattress through medical modalities 02/03/18; I had the patient scheduled for a two week followup however her daughter brought her in early concerned for discoloration on 2 areas of the wound circumference. We have bee using santyl 02/10/17;Better looking surface to the wound. Rim appears better suggesting better offloading. Using santyl 02/24/17; change to Silver Collegen last time. Wound appears better. 03/10/17; still using silver collagen religious offloading. Intake is satisfactory per her daughter. Dimension slightly better 03/12/2017 -- Dr. Jannetta Quint patient who had been seen 2 days ago and was doing fairly well. The patient is brought in by her daughter who noticed a new wound just above the previous wound on her sacral area and going on more to the left lateral side. She was very concerned and we asked her to get in for an opinion. 03/17/17; above is noted. The patient has developed a progressive area to the left of her original wound. This seems to this started with a ring of red skin with a more pale interior almost looking fungal. There was a rim of  blister through part of the area although this did not look like zoster. They have been applying triamcinolone that was prescribed last week by Dr. Meyer Russel and the area has a fold to a linear band area which is confluent, erythematous and with obvious epidermal swelling but there is no overt tenderness or crepitus. She has lost some surface epithelium closer to the wound surface itself and now has a more superficial wound in this area and the extending erythema goes towards the left buttock. This is well demarcated between involved in normal skin but once again does not appear to be at all tender. If there is a contact issue here I cannot get the history out of the daughter or the caregiver that are with her. 03/24/17; the patient arrives today with the wound slightly worse slightly more drainage. The bandlike area of erythema that I treated as a possible pineal infection has improved somewhat although proximally is still has confluent erythema without overt tenderness. We have been using silver alginate since the most recent deterioration. To the bandlike degree of erythema we have been using Lotrisone cream 03/31/17; patient arrives today with the wound slightly larger, necrotic surface and surrounding erythema. The bandlike area of erythema that I treated as a possible pineal infection is less swollen but still present I've been using Lotrisone cream on that largely related to the presence of a tinea looking infection when this was first  seen. Melina Fiddler been using silver alginate. X-ray that I ordered last week showed no acute bony abnormalities mild fecal impaction. Lab work showed a comprehensive metabolic panel that was normal including an albumin of 3.8. White count was 9.5 hemoglobin 12.3 differential count normal. C-reactive protein was less than 1 and sedimentation rate and 17. The latter 2 values does not support an ongoing bacterial infection. ANJOLI, DIEMER ([id number]) 04/14/17; patient  arrives after a 2 week hiatus. Her wound is not in good condition. Although the base of the wound looks stable she still has an erythematous area that was apparently blistered over the weekend. This again points to the left. As our intake nurse pointed out today this is in the area where the tissue folds together and we may need to prevent try to prevent this. Lab work and x-ray that I did to 3 weeks ago were unremarkable including her albumin nevertheless she is an extremely frail condition physically. We have have been using [name] 04/22/17; I changed her to silver alginate because of the surrounding maceration and moisture last week. The daughter did not like the way the wound looked in the middle of the week and changed her back to [place]. They're putting gauze on top of this. Thinks still using Lotrisone. 05/06/17 on evaluation today patient sacral wound appears to be doing okay and does not seem to be any worse. She is having no significant pain during evaluation today the secondary to mental status she is unable to rate or describe whether she had any pain she was not however flinching. Her daughter states that the wound does appear to be looking better to her. Still we are having difficulty with the skinfold that seems to be closing in on itself at this point. All in all I feel like she is making some good progress in the [name] seems to be the official for her. They do tell me that a refill if we are gonna continue that today. No fevers, chills, nausea, or vomiting noted at this time. 05/13/17 presents today for evaluation concerning her ongoing sacral pressure ulcer. Unfortunately she also has an area of deep tissue injury in the right Ischial region which is starting to show up. The sacral wound also continues to show signs of necrotic tissue overlying and has declined. Overall we really have not seen a significant improvement in the past several months in regard to the sacral wound and now  patient is starting to develop a new wound in the right Ischial region. Obviously this is not trending in the direction that we want to see. No fevers, chills, nausea, or vomiting noted at this time. 05/19/17; I have not seen this wound and almost a month however there is nothing really positive to say about it. Necrotic tissue over the surface which superiorly I think abuts on her sacrum. She has surrounding erythema. I would be surprised if there is not underlying osteomyelitis or soft tissue infection. 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 She apparently eats well per description although I wonder about this looking at her. Lab work I did probably 4 weeks ago however was really quite normal including a serum albumin 05/26/17; culture I did last week grew Escherichia coli and methicillin sensitive staph aureus which should've been well covered by the Augmentin and ciprofloxacin. Indeed the erythema around the wound in the bed of the wound looks somewhat better. Her intake is still satisfactory. They now have a near fluidized bed 06/02/17; they  completed the antibiotics last Friday. Using collagen. Daughter still reports eating and drinking well. There is less visualized erythema around the wound. 06/16/17; large pressure ulcer over her lower sacrum and coccyx. Using [name] to the wound bed. 06/30/17; certainly no change in dimensions of this large stage III wound over her sacrum and coccyx. They've been using [name]. There is no exposed bone. She has a candidal/tinea area in the right inguinal area. Other than that her daughter relates that she is eating and drinking well there changing her positioning to make sure the areas offloaded 07/14/17; no major change in the dimensions of this large stage III wound. Initially a smaller wound that became secondarily infected causing significant tissue breakdown although it is been stable in the last several weeks. Tunneling superiorly at roughly  1:00 no change here either. There is no bone palpable. Both the patient's daughter and caretaker states that she eats well. I have not rechecked her blood work 07/27/17; patient arrives in clinic today and generally a deteriorated looking state. Mild fever with axillary temperature of 100.4. Daughter reports she has not been eating and drinking well since yesterday. She looks more pale and thin and less responsive. We have been using silver collagen to her wound 08/11/17; since the last time the patient was here things have gone better. Her fever went down and she started eating and drinking again. Culture I did of the wound showed methicillin sensitive staph aureus, Morganella and enterococcus. I only treated her with Keflex which would've not covered the Morganella and enterococcus however the purulent area on the 2:00 side of her wound is a lot better and the bandlike erythema that concern me also was resolved. I'd called the daughter last week to confirm that she was a lot better. She finished the Keflex last Thursday she also suffered a skin tear this morning perhaps while putting on her incontinence brief period is on the lateral aspect of her right leg. Clean wound with the surface epithelium not viable. 08/25/17; last week the patient was noted to have erythema around the wound margin and a slight fever which the patient's daughter says was 9. Our office was contacted by home health however we did not have a space to work the patient in that she went to see her primary physician Dr. Maurice March. She was not febrile during this visit on 08/21/17 there was erythema around the wound similar to last occasion. Dr. Maurice March in reference to my culture from 07/28/17 and put her on doxycycline capsules which they're opening twice a day for 10 days. 09/17/17; patient arrives today with the wound bed looking fairly well granulated. There is undermining from 4 to 6:00 although this seems to of contracted  slightly. She does not have obvious infection although the daughter states there was some darkening of the wound circumference that is not evident today. They state she is eating well. They are concerned about oral thrush 09/30/16; very fibrin looking granulated wound bed. Her undermining from 4 to 6:00 is about the same but also appears to be MARLIYAH, REID. ([id number]) well granulated. She has rolled edges of senescent tissue from roughly 7 to 12:00. There is no evidence of infection Objective Constitutional Patient is hypertensive.. Pulse regular and within target range for patient.Marland Kitchen Respirations regular, non-labored and within target range.. Temperature is normal and within the target range for the patient.Marland Kitchen appears in no distress. No obvious systemic problems. Vitals Time Taken: 10:10 AM, Height: 60 in, Weight: 100 lbs, BMI:  19.5, Temperature: 97.7 F, Pulse: 55 bpm, Respiratory Rate: 14 breaths/min, Blood Pressure: 142/74 mmHg. Eyes Conjunctivae clear. No discharge. Respiratory Respiratory effort is easy and symmetric bilaterally. Rate is normal at rest and on room air.. Gastrointestinal (GI) Abdomen is soft and non-distended without masses or tenderness. Bowel sounds active in all quadrants.. Psychiatric advanced dementia. General Notes: wound exam; patient's wound is on the lower sacrum/coccyx. Dimensions not better than last visit however the granulation continuesto look better. She has the tunneled area from 4 to 6:00 however there is no palpable bone. Large areas of rolled senescent edges from 7 to 12:00 would preclude epithelialization although at this point I am reluctant to debride these unless we see advancing epithelialization from the other edges Integumentary (Hair, Skin) no rashes seen. No erythema around the wound or palpable crepitus. Wound #1 status is Open. Original cause of wound was Pressure Injury. The wound is located on the Midline Coccyx. The wound measures  5.2cm length x 5.5cm width x 0.5cm depth; 22.462cm^2 area and 11.231cm^3 volume. There is muscle and Fat Layer (Subcutaneous Tissue) Exposed exposed. There is no tunneling noted, however, there is undermining starting at 6:00 and ending at 8:00 with a maximum distance of 0.8cm. There is additional undermining and at 12:00 and ending at 2:00 with a maximum distance of 2cm. There is a large amount of purulent drainage noted. Foul odor after cleansing was noted. The wound margin is flat and intact. There is large (67-100%) red granulation within the wound bed. There is a small (1-33%) amount of necrotic tissue within the wound bed including Eschar and Adherent Slough. The periwound skin appearance exhibited: Induration, Erythema. The periwound skin appearance did not exhibit: Callus, Crepitus, Excoriation, Rash, Scarring, Dry/Scaly, Maceration, Atrophie Blanche, Cyanosis, Ecchymosis, Hemosiderin Staining, Mottled, Pallor, Rubor. The surrounding wound skin color is noted with erythema which is circumferential. Periwound temperature was noted as No Abnormality. The periwound has tenderness on palpation. VENISHA, BOEHNING ([id number]) Assessment Active Problems ICD-10 [phone] - Pressure ulcer of sacral region, stage 3 G20 - Parkinson's disease L03.312 - Cellulitis of back [any part except buttock] S81.811D - Laceration without foreign body, right lower leg, subsequent encounter Plan Wound Cleansing: Wound #1 Midline Coccyx: Clean wound with Normal Saline. May Shower, gently pat wound dry prior to applying new dressing. Anesthetic (add to Medication List): Wound #1 Midline Coccyx: Topical Lidocaine 4% cream applied to wound bed prior to debridement (In Clinic Only). Skin Barriers/Peri-Wound Care: Wound #1 Midline Coccyx: Skin Prep Primary Wound Dressing: Wound #1 Midline Coccyx: Prisma Ag - pack into undermining Secondary Dressing: Wound #1 Midline Coccyx: Boardered Foam Dressing - HHRN  PLEASE ORDER ALLEVYN LIFE BORDERED FOAM FOR PATIENT (OFF BRANDS IRRITATE HER SKIN) Dressing Change Frequency: Wound #1 Midline Coccyx: Change dressing every day. Follow-up Appointments: Return Appointment in 2 weeks. Off-Loading: Wound #1 Midline Coccyx: Roho cushion for wheelchair - HHRN please order roho cushion or gel cushion for patients wheelchair - whichever she qualifies for Turn and reposition every 2 hours Additional Orders / Instructions: Wound #1 Midline Coccyx: Increase protein intake. - please add protein supplements to patients diet Other: - please add vitamin A, vitamin C and zinc supplements to patients diet Home Health: Wound #1 Midline Coccyx: Continue Home Health Visits - [facility] Nurse may visit PRN to address patient s wound care needs. FACE TO FACE ENCOUNTER: MEDICARE and MEDICAID PATIENTS: I certify that this patient is under my care and that I had a face-to-face encounter that meets  the physician face-to-face encounter requirements with this patient on this date. The encounter with the patient was in whole or in part for the following MEDICAL CONDITION: (primary reason for Home Healthcare) MEDICAL NECESSITY: I certify, that based on my findings, NURSING services are a medically necessary home health service. HOME BOUND STATUS: I certify that my clinical findings support that this patient is homebound (i.e., Due to illness or injury, pt requires aid of supportive devices such as crutches, cane, wheelchairs, walkers, the use of special SHANITRA, PHILLIPPI. ([id number]) transportation or the assistance of another person to leave their place of residence. There is a normal inability to leave the home and doing so requires considerable and taxing effort. Other absences are for medical reasons / religious services and are infrequent or of short duration when for other reasons). If current dressing causes regression in wound condition, may D/C ordered dressing  product/s and apply Normal Saline Moist Dressing daily until next Wound Healing Center / Other MD appointment. Notify Wound Healing Center of regression in wound condition at [phone]. Please direct any NON-WOUND related issues/requests for orders to patient's Primary Care Physician - Dr Vonita Moss #1 continue with silver collagen. #2 she appears to be doing well in terms of the granulation filling in the wound even better than I could've hoped. The undermining area also appears to be closing out of bed although the actual measurements are not any different. There is no exposed bone at this point #3 I am reluctant to consider paring back the rolled edges and I discussed this with her daughter Electronic Signature(s) Signed: 09/30/2017 5:48:27 PM By: Baltazar Najjar MD Entered By: Baltazar Najjar on 09/30/2017 11:15:12 [name] ([id number]) -------------------------------------------------------------------------------- SuperBill Details Patient Name: [name] Date of Service: 09/30/2017 Medical Record Number: [id number] Patient Account Number: [id number] Date of Birth/Sex: [birth year] (84 y.o. Female) Treating RN: Curtis Sites Primary Care Provider: Vonita Moss Other Clinician: Referring Provider: Vonita Moss Treating Provider/Extender: [place] in Treatment: 38 Diagnosis Coding ICD-10 Codes Code Description L89.153 Pressure ulcer of sacral region, stage 3 G20 Parkinson's disease L03.312 Cellulitis of back [any part except buttock] S81.811D Laceration without foreign body, right lower leg, subsequent encounter Facility Procedures CPT4 Code: [id number] Description: 99213 - WOUND CARE VISIT-LEV 3 EST PT Modifier: Quantity: 1 Physician Procedures CPT4 Code: [id number] Description: 99213 - WC PHYS LEVEL 3 - EST PT ICD-10 Diagnosis Description L89.153 Pressure ulcer of sacral region, stage 3 Modifier: Quantity: 1 Electronic Signature(s) Signed:  09/30/2017 11:39:42 AM By: Curtis Sites Signed: 09/30/2017 5:48:27 PM By: Baltazar Najjar MD Entered By: Curtis Sites on 09/30/2017 11:39:41

## 2017-10-04 ENCOUNTER — Other Ambulatory Visit: Payer: Self-pay | Admitting: Family Medicine

## 2017-10-05 NOTE — Telephone Encounter (Signed)
I don't see where she has had an OV in the last year wit Dr. Dossie Arbourrissman.

## 2017-10-08 ENCOUNTER — Ambulatory Visit: Payer: Medicare Other | Admitting: Family Medicine

## 2017-10-13 ENCOUNTER — Encounter: Payer: Medicare Other | Admitting: Physician Assistant

## 2017-10-13 DIAGNOSIS — L89153 Pressure ulcer of sacral region, stage 3: Secondary | ICD-10-CM | POA: Diagnosis not present

## 2017-10-14 NOTE — Progress Notes (Signed)
[place], SARINANA ([id number]) Visit Report for 10/13/2017 Chief Complaint Document Details Patient Name: [name], [name] Date of Service: 10/13/2017 10:15 AM Medical Record Number: [id number] Patient Account Number: [id number] Date of Birth/Sex: [birth year] (84 y.o. Female) Treating RN: Curtis Sites Primary Care Provider: Vonita Moss Other Clinician: Referring Provider: Vonita Moss Treating Provider/Extender: Linwood Dibbles, HOYT Weeks in Treatment: 17 Information Obtained from: Patient Chief Complaint 01/07/17; patient is here for review of a sacral pressure ulcer Electronic Signature(s) Signed: 10/13/2017 4:32:07 PM By: Lenda Kelp PA-C Entered By: Lenda Kelp on 10/13/2017 12:46:05 [name] ([id number]) -------------------------------------------------------------------------------- HPI Details Patient Name: [name] Date of Service: 10/13/2017 10:15 AM Medical Record Number: [id number] Patient Account Number: [id number] Date of Birth/Sex: October 11, 1922 (84 y.o. Female) Treating RN: Curtis Sites Primary Care Provider: Vonita Moss Other Clinician: Referring Provider: Vonita Moss Treating Provider/Extender: Linwood Dibbles, HOYT Weeks in Treatment: 39 History of Present Illness HPI Description: 01/07/17 this is a 84 year old woman admitted to the clinic today for review of a pressure ulcer on her lower sacrum. She is referred from her primary physician's office after being seen on 3/22 with a 3 cm pressure area. Her daughter and caretaker accompanied her today state that the area first became obvious about a month ago and his since deteriorated. They have recently got [name] a home health involved and have been using [name] to the wound. They have ordered a pressure relief surface for her mattress. They are turning her religiously. They state that she eats well and they've been forcing fluids on her. She is on a multivitamin. Looking through [facility] point  last albumin I see was 4.4 on 10/28. The patient has advanced parkinsonism which looks superficially like advanced Parkinson's disease although her daughter tells me she did not ever respond to Sinemet therefore this may have another pathology with signs of parkinsonism. However I think this is largely a mute point currently. She also has dementia and is nonambulatory. Since this started they have been keeping her in bed and turning her religiously every 2 hours. She lives at home in [place] with her husband with 24/7 care giving 01/13/17 santyl change qd. Still will require further debridement. continue santyl. 01/20/17; patient's wound actually looks some better less adherent necrotic surface. There is actually visible granulation. We're using [name] 01/27/17; better-looking surface but still a lot of necrotic tissue on the base of this wound. The periwound erythema is better than last week we are still using [name]. Her daughter tells [place] that she is still having trouble with the pressure-relief mattress through medical modalities 02/03/18; I had the patient scheduled for a two week followup however her daughter brought her in early concerned for discoloration on 2 areas of the wound circumference. We have bee using santyl 02/10/17;Better looking surface to the wound. Rim appears better suggesting better offloading. Using santyl 02/24/17; change to Silver Collegen last time. Wound appears better. 03/10/17; still using silver collagen religious offloading. Intake is satisfactory per her daughter. Dimension slightly better 03/12/2017 -- Dr. Jannetta Quint patient who had been seen 2 days ago and was doing fairly well. The patient is brought in by her daughter who noticed a new wound just above the previous wound on her sacral area and going on more to the left lateral side. She was very concerned and we asked her to get in for an opinion. 03/17/17; above is noted. The patient has developed a progressive area  to the left of her original wound. This seems  to this started with a ring of red skin with a more pale interior almost looking fungal. There was a rim of blister through part of the area although this did not look like zoster. They have been applying triamcinolone that was prescribed last week by [name] and the area has a fold to a linear band area which is confluent, erythematous and with obvious epidermal swelling but there is no overt tenderness or crepitus. She has lost some surface epithelium closer to the wound surface itself and now has a more superficial wound in this area and the extending erythema goes towards the left buttock. This is well demarcated between involved in normal skin but once again does not appear to be at all tender. If there is a contact issue here I cannot get the history out of the daughter or the caregiver that are with her. 03/24/17; the patient arrives today with the wound slightly worse slightly more drainage. The bandlike area of erythema that I treated as a possible pineal infection has improved somewhat although proximally is still has confluent erythema without overt tenderness. We have been using silver alginate since the most recent deterioration. To the bandlike degree of erythema we have been using Lotrisone cream 03/31/17; patient arrives today with the wound slightly larger, necrotic surface and surrounding erythema. The bandlike area of erythema that I treated as a possible pineal infection is less swollen but still present I've been using Lotrisone cream on that largely related to the presence of a tinea looking infection when this was first seen. We've been using silver alginate. X-ray that I ordered last week showed no acute bony abnormalities mild fecal impaction. Lab work showed a comprehensive metabolic panel that was normal including an albumin of 3.8. White count was 9.5 hemoglobin 12.3 differential count normal. C-reactive protein was less than  1 and sedimentation rate and 17. The latter 2 values does not support an ongoing bacterial infection. 04/14/17; patient arrives after a 2 week hiatus. Her wound is not in good condition. Although the base of the wound looks Tina MainlandSHAW, Renuka W. ([id number]) stable she still has an erythematous area that was apparently blistered over the weekend. This again points to the left. As our intake nurse pointed out today this is in the area where the tissue folds together and we may need to prevent try to prevent this. Lab work and x-ray that I did to 3 weeks ago were unremarkable including her albumin nevertheless she is an extremely frail condition physically. We have have been using [name] 04/22/17; I changed her to silver alginate because of the surrounding maceration and moisture last week. The daughter did not like the way the wound looked in the middle of the week and changed her back to [place]. They're putting gauze on top of this. Thinks still using Lotrisone. 05/06/17 on evaluation today patient sacral wound appears to be doing okay and does not seem to be any worse. She is having no significant pain during evaluation today the secondary to mental status she is unable to rate or describe whether she had any pain she was not however flinching. Her daughter states that the wound does appear to be looking better to her. Still we are having difficulty with the skinfold that seems to be closing in on itself at this point. All in all I feel like she is making some good progress in the [name] seems to be the official for her. They do tell me that a refill if we  are gonna continue that today. No fevers, chills, nausea, or vomiting noted at this time. 05/13/17 presents today for evaluation concerning her ongoing sacral pressure ulcer. Unfortunately she also has an area of deep tissue injury in the right Ischial region which is starting to show up. The sacral wound also continues to show signs of necrotic tissue  overlying and has declined. Overall we really have not seen a significant improvement in the past several months in regard to the sacral wound and now patient is starting to develop a new wound in the right Ischial region. Obviously this is not trending in the direction that we want to see. No fevers, chills, nausea, or vomiting noted at this time. 05/19/17; I have not seen this wound and almost a month however there is nothing really positive to say about it. Necrotic tissue over the surface which superiorly I think abuts on her sacrum. She has surrounding erythema. I would be surprised if there is not underlying osteomyelitis or soft tissue infection. 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 She apparently eats well per description although I wonder about this looking at her. Lab work I did probably 4 weeks ago however was really quite normal including a serum albumin 05/26/17; culture I did last week grew Escherichia coli and methicillin sensitive staph aureus which should've been well covered by the Augmentin and ciprofloxacin. Indeed the erythema around the wound in the bed of the wound looks somewhat better. Her intake is still satisfactory. They now have a near fluidized bed 06/02/17; they completed the antibiotics last Friday. Using collagen. Daughter still reports eating and drinking well. There is less visualized erythema around the wound. 06/16/17; large pressure ulcer over her lower sacrum and coccyx. Using [name] to the wound bed. 06/30/17; certainly no change in dimensions of this large stage III wound over her sacrum and coccyx. They've been using [name]. There is no exposed bone. She has a candidal/tinea area in the right inguinal area. Other than that her daughter relates that she is eating and drinking well there changing her positioning to make sure the areas offloaded 07/14/17; no major change in the dimensions of this large stage III wound. Initially a smaller wound that  became secondarily infected causing significant tissue breakdown although it is been stable in the last several weeks. Tunneling superiorly at roughly 1:00 no change here either. There is no bone palpable. Both the patient's daughter and caretaker states that she eats well. I have not rechecked her blood work 07/27/17; patient arrives in clinic today and generally a deteriorated looking state. Mild fever with axillary temperature of 100.4. Daughter reports she has not been eating and drinking well since yesterday. She looks more pale and thin and less responsive. We have been using silver collagen to her wound 08/11/17; since the last time the patient was here things have gone better. Her fever went down and she started eating and drinking again. Culture I did of the wound showed methicillin sensitive staph aureus, Morganella and enterococcus. I only treated her with Keflex which would've not covered the Morganella and enterococcus however the purulent area on the 2:00 side of her wound is a lot better and the bandlike erythema that concern me also was resolved. I'd called the daughter last week to confirm that she was a lot better. She finished the Keflex last Thursday she also suffered a skin tear this morning perhaps while putting on her incontinence brief period is on the lateral aspect of her  right leg. Clean wound with the surface epithelium not viable. 08/25/17; last week the patient was noted to have erythema around the wound margin and a slight fever which the patient's daughter says was 32. Our office was contacted by home health however we did not have a space to work the patient in that she went to see her primary physician Dr. Maurice March. She was not febrile during this visit on 08/21/17 there was erythema around the wound similar to last occasion. Dr. Maurice March in reference to my culture from 07/28/17 and put her on doxycycline capsules which they're opening twice a day for 10 days. 09/17/17;  patient arrives today with the wound bed looking fairly well granulated. There is undermining from 4 to 6:00 although this seems to of contracted slightly. She does not have obvious infection although the daughter states there was some darkening of the wound circumference that is not evident today. They state she is eating well. They are concerned about oral thrush 09/30/16; very fibrin looking granulated wound bed. Her undermining from 4 to 6:00 is about the same but also appears to be well granulated. She has rolled edges of senescent tissue from roughly 7 to 12:00. There is no evidence of infection [name], [name] ([id number]) 10/13/17 on evaluation today patient appears to be doing fairly well all things considered in regard to her sacral wound. There's really not a lot of significant change or improvement she does have some evidence of contusion and deep tissue injury around the left border of the wound that patient's daughter did inquire about today. Nonetheless overall the wound appears to be doing about the same in my opinion. There is no significant indication of infection there also is no significant slough noted at this point. Electronic Signature(s) Signed: 10/13/2017 4:32:07 PM By: Lenda Kelp PA-C Entered By: Lenda Kelp on 10/13/2017 12:46:19 [name] ([id number]) -------------------------------------------------------------------------------- Physical Exam Details Patient Name: [name] Date of Service: 10/13/2017 10:15 AM Medical Record Number: [id number] Patient Account Number: [id number] Date of Birth/Sex: 01/21/1923 (84 y.o. Female) Treating RN: Curtis Sites Primary Care Provider: Vonita Moss Other Clinician: Referring Provider: Vonita Moss Treating Provider/Extender: STONE III, HOYT Weeks in Treatment: 39 Constitutional Chronically ill appearing but in no apparent acute distress. Respiratory normal breathing without difficulty. clear to  auscultation bilaterally. Cardiovascular regular rate and rhythm with normal S1, S2. Psychiatric Patient is not able to cooperate in decision making regarding care. Patient has dementia. patient is confused. Notes Patient's wound bed does not show any slough covering there is granulation in some areas little bit of hyper granulation. Nonetheless patient's daughter feels that she is doing well with the Prisma at this time and again I do not see the evidence that this is doing poorly. No debridement required. Electronic Signature(s) Signed: 10/13/2017 4:32:07 PM By: Lenda Kelp PA-C Entered By: Lenda Kelp on 10/13/2017 12:47:51 [name] ([id number]) -------------------------------------------------------------------------------- Physician Orders Details Patient Name: [name] Date of Service: 10/13/2017 10:15 AM Medical Record Number: [id number] Patient Account Number: [id number] Date of Birth/Sex: Oct 06, 1922 (84 y.o. Female) Treating RN: Curtis Sites Primary Care Provider: Vonita Moss Other Clinician: Referring Provider: Vonita Moss Treating Provider/Extender: Linwood Dibbles, HOYT Weeks in Treatment: 37 Verbal / Phone Orders: No Diagnosis Coding Wound Cleansing Wound #1 Midline Coccyx o Clean wound with Normal Saline. o May Shower, gently pat wound dry prior to applying new dressing. Anesthetic (add to Medication List) Wound #1 Midline Coccyx o Topical Lidocaine 4% cream applied  to wound bed prior to debridement (In Clinic Only). Skin Barriers/Peri-Wound Care Wound #1 Midline Coccyx o Skin Prep Primary Wound Dressing Wound #1 Midline Coccyx o Prisma Ag - pack into undermining Secondary Dressing Wound #1 Midline Coccyx o Boardered Foam Dressing - HHRN PLEASE ORDER ALLEVYN LIFE BORDERED FOAM FOR PATIENT (OFF BRANDS IRRITATE HER SKIN) Dressing Change Frequency Wound #1 Midline Coccyx o Change dressing every day. Follow-up  Appointments o Return Appointment in 2 weeks. Off-Loading Wound #1 Midline Coccyx o Roho cushion for wheelchair o Mattress - fluidized air mattress o Turn and reposition every 2 hours Additional Orders / Instructions Wound #1 Midline Coccyx o Increase protein intake. - please add protein supplements to patients diet o Other: - please add vitamin A, vitamin C and zinc supplements to patients diet [address] KATELYNNE, REVAK ([id number]) Wound #1 Midline Coccyx o Continue Home Health Visits - [facility] o Home Health Nurse may visit PRN to address patientos wound care needs. o FACE TO FACE ENCOUNTER: MEDICARE and MEDICAID PATIENTS: I certify that this patient is under my care and that I had a face-to-face encounter that meets the physician face-to-face encounter requirements with this patient on this date. The encounter with the patient was in whole or in part for the following MEDICAL CONDITION: (primary reason for Home Healthcare) MEDICAL NECESSITY: I certify, that based on my findings, NURSING services are a medically necessary home health service. HOME BOUND STATUS: I certify that my clinical findings support that this patient is homebound (i.e., Due to illness or injury, pt requires aid of supportive devices such as crutches, cane, wheelchairs, walkers, the use of special transportation or the assistance of another person to leave their place of residence. There is a normal inability to leave the home and doing so requires considerable and taxing effort. Other absences are for medical reasons / religious services and are infrequent or of short duration when for other reasons). o If current dressing causes regression in wound condition, may D/C ordered dressing product/s and apply Normal Saline Moist Dressing daily until next Wound Healing Center / Other MD appointment. Notify Wound Healing Center of regression in wound condition at [phone]. o Please direct  any NON-WOUND related issues/requests for orders to patient's Primary Care Physician - Dr Vonita Moss Electronic Signature(s) Signed: 10/13/2017 3:51:48 PM By: Curtis Sites Signed: 10/13/2017 4:32:07 PM By: Lenda Kelp PA-C Entered By: Curtis Sites on 10/13/2017 10:46:39 [name] ([id number]) -------------------------------------------------------------------------------- Problem List Details Patient Name: [name] Date of Service: 10/13/2017 10:15 AM Medical Record Number: [id number] Patient Account Number: [id number] Date of Birth/Sex: 12/17/1922 (84 y.o. Female) Treating RN: Curtis Sites Primary Care Provider: Vonita Moss Other Clinician: Referring Provider: Vonita Moss Treating Provider/Extender: Linwood Dibbles, HOYT Weeks in Treatment: 75 Active Problems ICD-10 Encounter Code Description Active Date Diagnosis L89.153 Pressure ulcer of sacral region, stage 3 01/07/2017 Yes G20 Parkinson's disease 01/07/2017 Yes L03.312 Cellulitis of back [any part except buttock] 07/28/2017 Yes S81.811D Laceration without foreign body, right lower leg, subsequent 08/11/2017 Yes encounter Inactive Problems Resolved Problems Electronic Signature(s) Signed: 10/13/2017 4:32:07 PM By: Lenda Kelp PA-C Entered By: Lenda Kelp on 10/13/2017 12:45:58 [name] ([id number]) -------------------------------------------------------------------------------- Progress Note Details Patient Name: [name] Date of Service: 10/13/2017 10:15 AM Medical Record Number: [id number] Patient Account Number: [id number] Date of Birth/Sex: 14-Oct-1922 (84 y.o. Female) Treating RN: Curtis Sites Primary Care Provider: Vonita Moss Other Clinician: Referring Provider: Vonita Moss Treating Provider/Extender: STONE III, HOYT Weeks in Treatment:  39 Subjective Chief Complaint Information obtained from Patient 01/07/17; patient is here for review of a sacral pressure  ulcer History of Present Illness (HPI) 01/07/17 this is a 84 year old woman admitted to the clinic today for review of a pressure ulcer on her lower sacrum. She is referred from her primary physician's office after being seen on 3/22 with a 3 cm pressure area. Her daughter and caretaker accompanied her today state that the area first became obvious about a month ago and his since deteriorated. They have recently got [name] a home health involved and have been using [name] to the wound. They have ordered a pressure relief surface for her mattress. They are turning her religiously. They state that she eats well and they've been forcing fluids on her. She is on a multivitamin. Looking through [facility] point last albumin I see was 4.4 on 10/28. The patient has advanced parkinsonism which looks superficially like advanced Parkinson's disease although her daughter tells me she did not ever respond to Sinemet therefore this may have another pathology with signs of parkinsonism. However I think this is largely a mute point currently. She also has dementia and is nonambulatory. Since this started they have been keeping her in bed and turning her religiously every 2 hours. She lives at home in [place] with her husband with 24/7 care giving 01/13/17 santyl change qd. Still will require further debridement. continue santyl. 01/20/17; patient's wound actually looks some better less adherent necrotic surface. There is actually visible granulation. We're using [name] 01/27/17; better-looking surface but still a lot of necrotic tissue on the base of this wound. The periwound erythema is better than last week we are still using [name]. Her daughter tells [place] that she is still having trouble with the pressure-relief mattress through medical modalities 02/03/18; I had the patient scheduled for a two week followup however her daughter brought her in early concerned for discoloration on 2 areas of the wound  circumference. We have bee using santyl 02/10/17;Better looking surface to the wound. Rim appears better suggesting better offloading. Using santyl 02/24/17; change to Silver Collegen last time. Wound appears better. 03/10/17; still using silver collagen religious offloading. Intake is satisfactory per her daughter. Dimension slightly better 03/12/2017 -- Dr. Jannetta Quint patient who had been seen 2 days ago and was doing fairly well. The patient is brought in by her daughter who noticed a new wound just above the previous wound on her sacral area and going on more to the left lateral side. She was very concerned and we asked her to get in for an opinion. 03/17/17; above is noted. The patient has developed a progressive area to the left of her original wound. This seems to this started with a ring of red skin with a more pale interior almost looking fungal. There was a rim of blister through part of the area although this did not look like zoster. They have been applying triamcinolone that was prescribed last week by Dr. Meyer Russel and the area has a fold to a linear band area which is confluent, erythematous and with obvious epidermal swelling but there is no overt tenderness or crepitus. She has lost some surface epithelium closer to the wound surface itself and now has a more superficial wound in this area and the extending erythema goes towards the left buttock. This is well demarcated between involved in normal skin but once again does not appear to be at all tender. If there is a contact issue here I cannot get  the history out of the daughter or the caregiver that are with her. 03/24/17; the patient arrives today with the wound slightly worse slightly more drainage. The bandlike area of erythema that I treated as a possible pineal infection has improved somewhat although proximally is still has confluent erythema without overt tenderness. We have been using silver alginate since the most recent  deterioration. To the bandlike degree of erythema we have been using Lotrisone cream 03/31/17; patient arrives today with the wound slightly larger, necrotic surface and surrounding erythema. The bandlike area of erythema that I treated as a possible pineal infection is less swollen but still present I've been using Lotrisone cream on that largely related to the presence of a tinea looking infection when this was first seen. We've been using silver alginate. [name], [name] ([id number]) X-ray that I ordered last week showed no acute bony abnormalities mild fecal impaction. Lab work showed a comprehensive metabolic panel that was normal including an albumin of 3.8. White count was 9.5 hemoglobin 12.3 differential count normal. C-reactive protein was less than 1 and sedimentation rate and 17. The latter 2 values does not support an ongoing bacterial infection. 04/14/17; patient arrives after a 2 week hiatus. Her wound is not in good condition. Although the base of the wound looks stable she still has an erythematous area that was apparently blistered over the weekend. This again points to the left. As our intake nurse pointed out today this is in the area where the tissue folds together and we may need to prevent try to prevent this. Lab work and x-ray that I did to 3 weeks ago were unremarkable including her albumin nevertheless she is an extremely frail condition physically. We have have been using [name] 04/22/17; I changed her to silver alginate because of the surrounding maceration and moisture last week. The daughter did not like the way the wound looked in the middle of the week and changed her back to [place]. They're putting gauze on top of this. Thinks still using Lotrisone. 05/06/17 on evaluation today patient sacral wound appears to be doing okay and does not seem to be any worse. She is having no significant pain during evaluation today the secondary to mental status she is unable to rate or  describe whether she had any pain she was not however flinching. Her daughter states that the wound does appear to be looking better to her. Still we are having difficulty with the skinfold that seems to be closing in on itself at this point. All in all I feel like she is making some good progress in the [name] seems to be the official for her. They do tell me that a refill if we are gonna continue that today. No fevers, chills, nausea, or vomiting noted at this time. 05/13/17 presents today for evaluation concerning her ongoing sacral pressure ulcer. Unfortunately she also has an area of deep tissue injury in the right Ischial region which is starting to show up. The sacral wound also continues to show signs of necrotic tissue overlying and has declined. Overall we really have not seen a significant improvement in the past several months in regard to the sacral wound and now patient is starting to develop a new wound in the right Ischial region. Obviously this is not trending in the direction that we want to see. No fevers, chills, nausea, or vomiting noted at this time. 05/19/17; I have not seen this wound and almost a month however there is nothing really  positive to say about it. Necrotic tissue over the surface which superiorly I think abuts on her sacrum. She has surrounding erythema. I would be surprised if there is not underlying osteomyelitis or soft tissue infection. 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 She apparently eats well per description although I wonder about this looking at her. Lab work I did probably 4 weeks ago however was really quite normal including a serum albumin 05/26/17; culture I did last week grew Escherichia coli and methicillin sensitive staph aureus which should've been well covered by the Augmentin and ciprofloxacin. Indeed the erythema around the wound in the bed of the wound looks somewhat better. Her intake is still satisfactory. They now have a near  fluidized bed 06/02/17; they completed the antibiotics last Friday. Using collagen. Daughter still reports eating and drinking well. There is less visualized erythema around the wound. 06/16/17; large pressure ulcer over her lower sacrum and coccyx. Using [name] to the wound bed. 06/30/17; certainly no change in dimensions of this large stage III wound over her sacrum and coccyx. They've been using [name]. There is no exposed bone. She has a candidal/tinea area in the right inguinal area. Other than that her daughter relates that she is eating and drinking well there changing her positioning to make sure the areas offloaded 07/14/17; no major change in the dimensions of this large stage III wound. Initially a smaller wound that became secondarily infected causing significant tissue breakdown although it is been stable in the last several weeks. Tunneling superiorly at roughly 1:00 no change here either. There is no bone palpable. Both the patient's daughter and caretaker states that she eats well. I have not rechecked her blood work 07/27/17; patient arrives in clinic today and generally a deteriorated looking state. Mild fever with axillary temperature of 100.4. Daughter reports she has not been eating and drinking well since yesterday. She looks more pale and thin and less responsive. We have been using silver collagen to her wound 08/11/17; since the last time the patient was here things have gone better. Her fever went down and she started eating and drinking again. Culture I did of the wound showed methicillin sensitive staph aureus, Morganella and enterococcus. I only treated her with Keflex which would've not covered the Morganella and enterococcus however the purulent area on the 2:00 side of her wound is a lot better and the bandlike erythema that concern me also was resolved. I'd called the daughter last week to confirm that she was a lot better. She finished the Keflex last Thursday she  also suffered a skin tear this morning perhaps while putting on her incontinence brief period is on the lateral aspect of her right leg. Clean wound with the surface epithelium not viable. 08/25/17; last week the patient was noted to have erythema around the wound margin and a slight fever which the patient's daughter says was 92. Our office was contacted by home health however we did not have a space to work the patient in that she went to see her primary physician Dr. Maurice March. She was not febrile during this visit on 08/21/17 there was erythema around the wound similar to last occasion. Dr. Maurice March in reference to my culture from 07/28/17 and put her on doxycycline capsules which they're opening twice a day for 10 days. [name], [name] ([id number]) 09/17/17; patient arrives today with the wound bed looking fairly well granulated. There is undermining from 4 to 6:00 although this seems to of contracted  slightly. She does not have obvious infection although the daughter states there was some darkening of the wound circumference that is not evident today. They state she is eating well. They are concerned about oral thrush 09/30/16; very fibrin looking granulated wound bed. Her undermining from 4 to 6:00 is about the same but also appears to be well granulated. She has rolled edges of senescent tissue from roughly 7 to 12:00. There is no evidence of infection 10/13/17 on evaluation today patient appears to be doing fairly well all things considered in regard to her sacral wound. There's really not a lot of significant change or improvement she does have some evidence of contusion and deep tissue injury around the left border of the wound that patient's daughter did inquire about today. Nonetheless overall the wound appears to be doing about the same in my opinion. There is no significant indication of infection there also is no significant slough noted at this point. Patient History Unable to Obtain  Patient History due to Altered Mental Status. Information obtained from Patient. Social History Never smoker, Marital Status - Married, Alcohol Use - Never, Drug Use - No History, Caffeine Use - Never. Medical And Surgical History Notes Ear/Nose/Mouth/Throat dysphagia Neurologic parkinsons, tremor Review of Systems (ROS) Constitutional Symptoms (General Health) Denies complaints or symptoms of Fever, Chills. Respiratory The patient has no complaints or symptoms. Cardiovascular The patient has no complaints or symptoms. Psychiatric The patient has no complaints or symptoms. Objective Constitutional Chronically ill appearing but in no apparent acute distress. Vitals Time Taken: 10:07 AM, Height: 60 in, Weight: 100 lbs, BMI: 19.5, Temperature: 98.4 F, Pulse: 94 bpm, Respiratory Rate: 16 breaths/min, Blood Pressure: 143/92 mmHg. Respiratory normal breathing without difficulty. clear to auscultation bilaterally. Cardiovascular [name], [name]. ([id number]) regular rate and rhythm with normal S1, S2. Psychiatric Patient is not able to cooperate in decision making regarding care. Patient has dementia. patient is confused. General Notes: Patient's wound bed does not show any slough covering there is granulation in some areas little bit of hyper granulation. Nonetheless patient's daughter feels that she is doing well with the Prisma at this time and again I do not see the evidence that this is doing poorly. No debridement required. Integumentary (Hair, Skin) Wound #1 status is Open. Original cause of wound was Pressure Injury. The wound is located on the Midline Coccyx. The wound measures 6cm length x 4.6cm width x 0.5cm depth; 21.677cm^2 area and 10.838cm^3 volume. There is muscle and Fat Layer (Subcutaneous Tissue) Exposed exposed. There is no tunneling noted, however, there is undermining starting at 12:00 and ending at 2:00 with a maximum distance of 2.3cm. There is additional  undermining and at 6:00 and ending at 8:00 with a maximum distance of 0.6cm. There is a large amount of purulent drainage noted. Foul odor after cleansing was noted. The wound margin is flat and intact. There is large (67-100%) red granulation within the wound bed. There is a small (1-33%) amount of necrotic tissue within the wound bed including Eschar and Adherent Slough. The periwound skin appearance exhibited: Induration, Erythema. The periwound skin appearance did not exhibit: Callus, Crepitus, Excoriation, Rash, Scarring, Dry/Scaly, Maceration, Atrophie Blanche, Cyanosis, Ecchymosis, Hemosiderin Staining, Mottled, Pallor, Rubor. The surrounding wound skin color is noted with erythema which is circumferential. Periwound temperature was noted as No Abnormality. The periwound has tenderness on palpation. Assessment Active Problems ICD-10 L89.153 - Pressure ulcer of sacral region, stage 3 G20 - Parkinson's disease L03.312 - Cellulitis of back [any part  except buttock] S81.811D - Laceration without foreign body, right lower leg, subsequent encounter Plan Wound Cleansing: Wound #1 Midline Coccyx: Clean wound with Normal Saline. May Shower, gently pat wound dry prior to applying new dressing. Anesthetic (add to Medication List): Wound #1 Midline Coccyx: Topical Lidocaine 4% cream applied to wound bed prior to debridement (In Clinic Only). Skin Barriers/Peri-Wound Care: Wound #1 Midline Coccyx: Skin Prep Primary Wound Dressing: Wound #1 Midline Coccyx: Prisma Ag - pack into undermining Secondary Dressing: Wound #1 Midline Coccyx: [name], [name]. ([id number]) Boardered Foam Dressing - HHRN PLEASE ORDER [employer] BORDERED FOAM FOR PATIENT (OFF BRANDS IRRITATE HER SKIN) Dressing Change Frequency: Wound #1 Midline Coccyx: Change dressing every day. Follow-up Appointments: Return Appointment in 2 weeks. Off-Loading: Wound #1 Midline Coccyx: Roho cushion for wheelchair Mattress -  fluidized air mattress Turn and reposition every 2 hours Additional Orders / Instructions: Wound #1 Midline Coccyx: Increase protein intake. - please add protein supplements to patients diet Other: - please add vitamin A, vitamin C and zinc supplements to patients diet Home Health: Wound #1 Midline Coccyx: Continue Home Health Visits - [facility] Nurse may visit PRN to address patient s wound care needs. FACE TO FACE ENCOUNTER: MEDICARE and MEDICAID PATIENTS: I certify that this patient is under my care and that I had a face-to-face encounter that meets the physician face-to-face encounter requirements with this patient on this date. The encounter with the patient was in whole or in part for the following MEDICAL CONDITION: (primary reason for Home Healthcare) MEDICAL NECESSITY: I certify, that based on my findings, NURSING services are a medically necessary home health service. HOME BOUND STATUS: I certify that my clinical findings support that this patient is homebound (i.e., Due to illness or injury, pt requires aid of supportive devices such as crutches, cane, wheelchairs, walkers, the use of special transportation or the assistance of another person to leave their place of residence. There is a normal inability to leave the home and doing so requires considerable and taxing effort. Other absences are for medical reasons / religious services and are infrequent or of short duration when for other reasons). If current dressing causes regression in wound condition, may D/C ordered dressing product/s and apply Normal Saline Moist Dressing daily until next Wound Healing Center / Other MD appointment. Notify Wound Healing Center of regression in wound condition at [phone]. Please direct any NON-WOUND related issues/requests for orders to patient's Primary Care Physician - Dr Vonita Moss I'm going to recommend that we continue with the Current wound care measures for the time  being. Patient s daughter is in agreement with this plan. We will see were things stand in two weeks time. Please see above for specific wound care orders. We will see patient for re-evaluation in 1 week(s) here in the clinic. If anything worsens or changes patient will contact our office for additional recommendations. Electronic Signature(s) Signed: 10/13/2017 4:32:07 PM By: Lenda Kelp PA-C Entered By: Lenda Kelp on 10/13/2017 12:48:11 [name] ([id number]) -------------------------------------------------------------------------------- ROS/PFSH Details Patient Name: [name] Date of Service: 10/13/2017 10:15 AM Medical Record Number: [id number] Patient Account Number: [id number] Date of Birth/Sex: Dec 14, 1922 (84 y.o. Female) Treating RN: Curtis Sites Primary Care Provider: Vonita Moss Other Clinician: Referring Provider: Vonita Moss Treating Provider/Extender: Linwood Dibbles, HOYT Weeks in Treatment: 73 Unable to Obtain Patient History due to oo Altered Mental Status Information Obtained From Patient Wound History Do you currently have one or more open woundso Yes  How many open wounds do you currently haveo 1 Approximately how long have you had your woundso 1 week How have you been treating your wound(s) until nowo santyl and gauze Has your wound(s) ever healed and then re-openedo No Have you had any lab work done in the past montho No Have you tested positive for an antibiotic resistant organism (MRSA, VRE)o No Have you tested positive for osteomyelitis (bone infection)o No Have you had any tests for circulation on your legso No Constitutional Symptoms (General Health) Complaints and Symptoms: Negative for: Fever; Chills Eyes Medical History: Negative for: Cataracts; Glaucoma; Optic Neuritis Ear/Nose/Mouth/Throat Medical History: Negative for: Chronic sinus problems/congestion; Middle ear problems Past Medical History  Notes: dysphagia Hematologic/Lymphatic Medical History: Positive for: Anemia Negative for: Hemophilia; Human Immunodeficiency Virus; Lymphedema; Sickle Cell Disease Respiratory Complaints and Symptoms: No Complaints or Symptoms Medical History: Negative for: Aspiration; Asthma; Chronic Obstructive Pulmonary Disease (COPD); Pneumothorax; Sleep Apnea; Tuberculosis Cardiovascular [name], [name]. ([id number]) Complaints and Symptoms: No Complaints or Symptoms Medical History: Positive for: Hypertension Negative for: Angina; Arrhythmia; Congestive Heart Failure; Coronary Artery Disease; Deep Vein Thrombosis; Hypotension; Myocardial Infarction; Peripheral Arterial Disease; Peripheral Venous Disease; Phlebitis; Vasculitis Gastrointestinal Medical History: Negative for: Cirrhosis ; Colitis; Crohnos; Hepatitis A; Hepatitis B; Hepatitis C Endocrine Medical History: Negative for: Type I Diabetes; Type II Diabetes Genitourinary Medical History: Negative for: End Stage Renal Disease Immunological Medical History: Negative for: Lupus Erythematosus; Raynaudos; Scleroderma Integumentary (Skin) Medical History: Negative for: History of Burn; History of pressure wounds Musculoskeletal Medical History: Negative for: Gout; Rheumatoid Arthritis; Osteoarthritis; Osteomyelitis Neurologic Medical History: Positive for: Dementia Negative for: Neuropathy; Quadriplegia; Paraplegia; Seizure Disorder Past Medical History Notes: parkinsons, tremor Oncologic Medical History: Negative for: Received Chemotherapy; Received Radiation Psychiatric Complaints and Symptoms: No Complaints or Symptoms Medical History: Negative for: Anorexia/bulimia; Confinement Anxiety [name], [name] ([id number]) Immunizations Pneumococcal Vaccine: Received Pneumococcal Vaccination: Yes Immunization Notes: up to date Implantable Devices Family and Social History Never smoker; Marital Status - Married; Alcohol  Use: Never; Drug Use: No History; Caffeine Use: Never; Financial Concerns: No; Food, Clothing or Shelter Needs: No; Support System Lacking: No; Transportation Concerns: No; Advanced Directives: Yes (Not Provided); Patient does not want information on Advanced Directives; Medical Power of Attorney: Yes - dtr (Not Provided) Physician Affirmation I have reviewed and agree with the above information. Electronic Signature(s) Signed: 10/13/2017 3:51:48 PM By: Curtis Sites Signed: 10/13/2017 4:32:07 PM By: Lenda Kelp PA-C Entered By: Lenda Kelp on 10/13/2017 12:46:38 [name] ([id number]) -------------------------------------------------------------------------------- SuperBill Details Patient Name: [name] Date of Service: 10/13/2017 Medical Record Number: [id number] Patient Account Number: [id number] Date of Birth/Sex: [birth year] (84 y.o. Female) Treating RN: Curtis Sites Primary Care Provider: Vonita Moss Other Clinician: Referring Provider: Vonita Moss Treating Provider/Extender: Linwood Dibbles, HOYT Weeks in Treatment: 39 Diagnosis Coding ICD-10 Codes Code Description L89.153 Pressure ulcer of sacral region, stage 3 G20 Parkinson's disease L03.312 Cellulitis of back [any part except buttock] S81.811D Laceration without foreign body, right lower leg, subsequent encounter Facility Procedures CPT4 Code: [id number] Description: 99213 - WOUND CARE VISIT-LEV 3 EST PT Modifier: Quantity: 1 Physician Procedures CPT4 Code: [id number] Description: 99213 - WC PHYS LEVEL 3 - EST PT ICD-10 Diagnosis Description L89.153 Pressure ulcer of sacral region, stage 3 G20 Parkinson's disease L03.312 Cellulitis of back [any part except buttock] S81.811D Laceration without foreign body, right lower  leg, subseque Modifier: nt encounter Quantity: 1 Electronic Signature(s) Signed: 10/13/2017 2:29:37 PM By: Curtis Sites Signed: 10/13/2017 4:32:07 PM By: Linwood Dibbles,  Hoyt  PA-C Entered By: Curtis Sites on 10/13/2017 14:29:36

## 2017-10-14 NOTE — Progress Notes (Signed)
Tina, [name] ([id number]) Visit Report for 10/13/2017 Arrival Information Details Patient Name: [name], [name] Date of Service: 10/13/2017 10:15 AM Medical Record Number: [id number] Patient Account Number: [id number] Date of Birth/Sex: 15-Sep-1923 (84 y.o. Female) Treating RN: Curtis Sites Primary Care [name]: [name] Other Clinician: Referring [name]: [name] Treating [name]/Extender: [name] in Treatment: 39 Visit Information History Since Last Visit Added or deleted any medications: No Patient Arrived: Wheel Chair Any new allergies or adverse reactions: No Arrival Time: 10:05 Had a fall or experienced change in No activities of daily living that may affect Accompanied By: dtr risk of falls: Transfer Assistance: Manual Signs or symptoms of abuse/neglect since No Patient Identification Verified: Yes last visito Secondary Verification Process Completed: Yes Hospitalized since last visit: No Patient Requires Transmission-Based No Has Dressing in Place as Prescribed: Yes Precautions: Pain Present Now: Unable to Patient Has Alerts: No Respond Electronic Signature(s) Signed: 10/13/2017 3:51:48 PM By: Curtis Sites Entered By: Curtis Sites on 10/13/2017 10:18:40 [name] ([id number]) -------------------------------------------------------------------------------- Clinic Level of Care Assessment Details Patient Name: [name] Date of Service: 10/13/2017 10:15 AM Medical Record Number: [id number] Patient Account Number: [id number] Date of Birth/Sex: 11-Aug-1923 (84 y.o. Female) Treating RN: Curtis Sites Primary Care [name]: [name] Other Clinician: Referring [name]: [name] Treating Maeleigh Buschman/Extender: Tina Dibbles, HOYT [name] in Treatment: 39 Clinic Level of Care Assessment Items TOOL 4 Quantity Score []  - Use when only an EandM is performed on FOLLOW-UP visit 0 ASSESSMENTS - Nursing Assessment /  Reassessment X - Reassessment of Co-morbidities (includes updates in patient status) 1 10 X- 1 5 Reassessment of Adherence to Treatment Plan ASSESSMENTS - Wound and Skin Assessment / Reassessment X - Simple Wound Assessment / Reassessment - one wound 1 5 []  - 0 Complex Wound Assessment / Reassessment - multiple wounds []  - 0 Dermatologic / Skin Assessment (not related to wound area) ASSESSMENTS - Focused Assessment []  - Circumferential Edema Measurements - multi extremities 0 []  - 0 Nutritional Assessment / Counseling / Intervention []  - 0 Lower Extremity Assessment (monofilament, tuning fork, pulses) []  - 0 Peripheral Arterial Disease Assessment (using hand held doppler) ASSESSMENTS - Ostomy and/or Continence Assessment and Care []  - Incontinence Assessment and Management 0 []  - 0 Ostomy Care Assessment and Management (repouching, etc.) PROCESS - Coordination of Care X - Simple Patient / Family Education for ongoing care 1 15 []  - 0 Complex (extensive) Patient / Family Education for ongoing care []  - 0 Staff obtains Chiropractor, Records, Test Results / Process Orders []  - 0 Staff telephones HHA, Nursing Homes / Clarify orders / etc []  - 0 Routine Transfer to another Facility (non-emergent condition) []  - 0 Routine Hospital Admission (non-emergent condition) []  - 0 New Admissions / Manufacturing engineer / Ordering NPWT, Apligraf, etc. []  - 0 Emergency Hospital Admission (emergent condition) X- 1 10 Simple Discharge Coordination Tina, [name] ([id number]) []  - 0 Complex (extensive) Discharge Coordination PROCESS - Special Needs []  - Pediatric / Minor Patient Management 0 []  - 0 Isolation Patient Management []  - 0 Hearing / Language / Visual special needs []  - 0 Assessment of Community assistance (transportation, D/C planning, etc.) []  - 0 Additional assistance / Altered mentation []  - 0 Support Surface(s) Assessment (bed, cushion, seat, etc.) INTERVENTIONS -  Wound Cleansing / Measurement X - Simple Wound Cleansing - one wound 1 5 []  - 0 Complex Wound Cleansing - multiple wounds X- 1 5 Wound Imaging (photographs - any number of wounds) []  -  0 Wound Tracing (instead of photographs) X- 1 5 Simple Wound Measurement - one wound []  - 0 Complex Wound Measurement - multiple wounds INTERVENTIONS - Wound Dressings X - Small Wound Dressing one or multiple wounds 1 10 []  - 0 Medium Wound Dressing one or multiple wounds []  - 0 Large Wound Dressing one or multiple wounds []  - 0 Application of Medications - topical []  - 0 Application of Medications - injection INTERVENTIONS - Miscellaneous []  - External ear exam 0 []  - 0 Specimen Collection (cultures, biopsies, blood, body fluids, etc.) []  - 0 Specimen(s) / Culture(s) sent or taken to Lab for analysis []  - 0 Patient Transfer (multiple staff / Nurse, adultHoyer Lift / Similar devices) []  - 0 Simple Staple / Suture removal (25 or less) []  - 0 Complex Staple / Suture removal (26 or more) []  - 0 Hypo / Hyperglycemic Management (close monitor of Blood Glucose) []  - 0 Ankle / Brachial Index (ABI) - do not check if billed separately X- 1 5 Vital Signs [name], [name] ([id number]) Has the patient been seen at the hospital within the last three years: Yes Total Score: 75 Level Of Care: New/Established - Level 2 Electronic Signature(s) Signed: 10/13/2017 3:51:48 PM By: Curtis [name], Tina Entered By: Curtis [name], Tina on 10/13/2017 14:29:27 [name], [name] ([id number]) -------------------------------------------------------------------------------- Encounter Discharge Information Details Patient Name: [name], [name] Date of Service: 10/13/2017 10:15 AM Medical Record Number: [id number] Patient Account Number: [id number] Date of Birth/Sex: [birth year] (84 y.o. Female) Treating RN: Curtis [name], Tina Primary Care Duval Macleod: Tina MossRISSMAN, MARK Other Clinician: Referring Lott Seelbach: Tina MossRISSMAN, MARK Treating  Matty Vanroekel/Extender: Tina DibblesSTONE III, HOYT [name] in Treatment: 7739 Encounter Discharge Information Items Discharge Pain Level: 0 Discharge Condition: Stable Ambulatory Status: Wheelchair Discharge Destination: Home Transportation: Private Auto Accompanied By: dtr Schedule Follow-up Appointment: Yes Medication Reconciliation completed and No provided to Patient/Care Lanyah Spengler: Patient Clinical Summary of Care: Declined Electronic Signature(s) Signed: 10/13/2017 2:54:06 PM By: Curtis [name], Tina Entered By: Curtis [name], Tina on 10/13/2017 14:54:05 [name], [name] ([id number]) -------------------------------------------------------------------------------- Multi Wound Chart Details Patient Name: [name], [name] Date of Service: 10/13/2017 10:15 AM Medical Record Number: [id number] Patient Account Number: [id number] Date of Birth/Sex: [birth year] (84 y.o. Female) Treating RN: Curtis [name], Tina Primary Care Khalee Mazo: Tina MossRISSMAN, MARK Other Clinician: Referring Brezlyn Manrique: Tina MossRISSMAN, MARK Treating Lanisha Stepanian/Extender: Tina DibblesSTONE III, HOYT [name] in Treatment: 39 Vital Signs Height(in): 60 Pulse(bpm): 94 Weight(lbs): 100 Blood Pressure(mmHg): 143/92 Body Mass Index(BMI): 20 Temperature(F): 98.4 Respiratory Rate 16 (breaths/min): Photos: [1:No Photos] [N/A:N/A] Wound Location: [1:Coccyx - Midline] [N/A:N/A] Wounding Event: [1:Pressure Injury] [N/A:N/A] Primary Etiology: [1:Pressure Ulcer] [N/A:N/A] Comorbid History: [1:Anemia, Hypertension, Dementia] [N/A:N/A] Date Acquired: [1:12/01/2016] [N/A:N/A] [name] of Treatment: [1:39] [N/A:N/A] Wound Status: [1:Open] [N/A:N/A] Measurements L x W x D [1:6x4.6x0.5] [N/A:N/A] (cm) Area (cm) : [1:21.677] [N/A:N/A] Volume (cm) : [1:10.838] [N/A:N/A] % Reduction in Area: [1:-421.70%] [N/A:N/A] % Reduction in Volume: [1:-552.10%] [N/A:N/A] Starting Position 1 [1:12] (o'clock): Ending Position 1 [1:2] (o'clock): Maximum Distance 1 (cm): [1:2.3] Starting Position  2 [1:6] (o'clock): Ending Position 2 [1:8] (o'clock): Maximum Distance 2 (cm): [1:0.6] Undermining: [1:Yes] [N/A:N/A] Classification: [1:Category/Stage IV] [N/A:N/A] Exudate Amount: [1:Large] [N/A:N/A] Exudate Type: [1:Purulent] [N/A:N/A] Exudate Color: [1:yellow, brown, green] [N/A:N/A] Foul Odor After Cleansing: [1:Yes] [N/A:N/A] Odor Anticipated Due to [1:No] [N/A:N/A] Product Use: Wound Margin: [1:Flat and Intact] [N/A:N/A] Granulation Amount: [1:Large (67-100%)] [N/A:N/A] Granulation Quality: [1:Red] [N/A:N/A] Necrotic Amount: [1:Small (1-33%)] [N/A:N/A] Necrotic Tissue: Eschar, Adherent Slough N/A N/A Exposed Structures: Fat Layer (Subcutaneous N/A N/A Tissue) Exposed: Yes Muscle: Yes Fascia: No Tendon: No Joint: No Bone: No  Epithelialization: None N/A N/A Periwound Skin Texture: Induration: Yes N/A N/A Excoriation: No Callus: No Crepitus: No Rash: No Scarring: No Periwound Skin Moisture: Maceration: No N/A N/A Dry/Scaly: No Periwound Skin Color: Erythema: Yes N/A N/A Atrophie Blanche: No Cyanosis: No Ecchymosis: No Hemosiderin Staining: No Mottled: No Pallor: No Rubor: No Erythema Location: Circumferential N/A N/A Temperature: No Abnormality N/A N/A Tenderness on Palpation: Yes N/A N/A Wound Preparation: Ulcer Cleansing: N/A N/A Rinsed/Irrigated with Saline Topical Anesthetic Applied: Other: lidocaine 4% Treatment Notes Electronic Signature(s) Signed: 10/13/2017 3:51:48 PM By: Curtis Sites Entered By: Curtis Sites on 10/13/2017 10:44:51 [name] ([id number]) -------------------------------------------------------------------------------- Multi-Disciplinary Care Plan Details Patient Name: [name] Date of Service: 10/13/2017 10:15 AM Medical Record Number: [id number] Patient Account Number: [id number] Date of Birth/Sex: [birth year] (84 y.o. Female) Treating RN: Curtis Sites Primary Care Jermane Brayboy: [name] Other  Clinician: Referring Duwan Adrian: [name] Treating Latresa Gasser/Extender: Tina [place] in Treatment: 30 Active Inactive ` Abuse / Safety / Falls / Self Care Management Nursing Diagnoses: Impaired physical mobility Potential for falls Goals: Patient will remain injury free Date Initiated: 01/07/2017 Target Resolution Date: 04/03/2017 Goal Status: Active Interventions: Assess fall risk on admission and as needed Notes: ` Nutrition Nursing Diagnoses: Potential for alteratiion in Nutrition/Potential for imbalanced nutrition Goals: Patient/caregiver agrees to and verbalizes understanding of need to use nutritional supplements and/or vitamins as prescribed Date Initiated: 01/07/2017 Target Resolution Date: 04/03/2017 Goal Status: Active Interventions: Assess patient nutrition upon admission and as needed per policy Notes: ` Orientation to the Wound Care Program Nursing Diagnoses: Knowledge deficit related to the wound healing center program Goals: Patient/caregiver will verbalize understanding of the Wound Healing Center Program Date Initiated: 01/07/2017 Target Resolution Date: 04/03/2017 Goal Status: Active EMILY, MASSAR ([id number]) Interventions: Provide education on orientation to the wound center Notes: ` Pressure Nursing Diagnoses: Knowledge deficit related to causes and risk factors for pressure ulcer development Goals: Patient will remain free from development of additional pressure ulcers Date Initiated: 01/07/2017 Target Resolution Date: 04/03/2017 Goal Status: Active Interventions: Assess potential for pressure ulcer upon admission and as needed Notes: ` Wound/Skin Impairment Nursing Diagnoses: Impaired tissue integrity Goals: Patient/caregiver will verbalize understanding of skin care regimen Date Initiated: 01/07/2017 Target Resolution Date: 04/03/2017 Goal Status: Active Ulcer/skin breakdown will have a volume reduction of 30% by week 4 Date  Initiated: 01/07/2017 Target Resolution Date: 04/03/2017 Goal Status: Active Ulcer/skin breakdown will have a volume reduction of 50% by week 8 Date Initiated: 01/07/2017 Target Resolution Date: 04/03/2017 Goal Status: Active Ulcer/skin breakdown will have a volume reduction of 80% by week 12 Date Initiated: 01/07/2017 Target Resolution Date: 04/03/2017 Goal Status: Active Ulcer/skin breakdown will heal within 14 [name] Date Initiated: 01/07/2017 Target Resolution Date: 04/03/2017 Goal Status: Active Interventions: Assess patient/caregiver ability to obtain necessary supplies Assess patient/caregiver ability to perform ulcer/skin care regimen upon admission and as needed Assess ulceration(s) every visit Notes: Electronic Signature(s) ZAMERIA, VOGL ([id number]) Signed: 10/13/2017 3:51:48 PM By: Curtis Sites Entered By: Curtis Sites on 10/13/2017 10:44:39 [name] ([id number]) -------------------------------------------------------------------------------- Pain Assessment Details Patient Name: [name] Date of Service: 10/13/2017 10:15 AM Medical Record Number: [id number] Patient Account Number: [id number] Date of Birth/Sex: 04-04-1923 (84 y.o. Female) Treating RN: Curtis Sites Primary Care Deonne Rooks: [name] Other Clinician: Referring Nahara Dona: [name] Treating Demetria Iwai/Extender: Tina [place] in Treatment: 39 Active Problems Location of Pain Severity and Description of Pain Patient Has Paino Patient Unable to Respond Site Locations Pain Management and Medication Current  Pain Management: Electronic Signature(s) Signed: 10/13/2017 3:51:48 PM By: Curtis Sites Entered By: Curtis Sites on 10/13/2017 10:18:47 [name] ([id number]) -------------------------------------------------------------------------------- Patient/Caregiver Education Details Patient Name: [name] Date of Service: 10/13/2017 10:15 AM Medical Record  Number: [id number] Patient Account Number: [id number] Date of Birth/Gender: [birth year] (84 y.o. Female) Treating RN: Curtis Sites Primary Care Physician: [name] Other Clinician: Referring Physician: Vonita [name] Treating Physician/Extender: Skeet Simmer in Treatment: 79 Education Assessment Education Provided To: Caregiver Education Topics Provided Wound/Skin Impairment: Handouts: Other: wound care as ordered Methods: Demonstration, Explain/Verbal Responses: State content correctly Electronic Signature(s) Signed: 10/13/2017 3:51:48 PM By: Curtis Sites Entered By: Curtis Sites on 10/13/2017 14:55:06 [name] ([id number]) -------------------------------------------------------------------------------- Wound Assessment Details Patient Name: [name] Date of Service: 10/13/2017 10:15 AM Medical Record Number: [id number] Patient Account Number: [id number] Date of Birth/Sex: [birth year] (84 y.o. Female) Treating RN: Curtis Sites Primary Care Sicilia Killough: [name] Other Clinician: Referring Jessalyn Hinojosa: [name] Treating Neta Upadhyay/Extender: Tina [place] in Treatment: 39 Wound Status Wound Number: 1 Primary Etiology: Pressure Ulcer Wound Location: Coccyx - Midline Wound Status: Open Wounding Event: Pressure Injury Comorbid History: Anemia, Hypertension, Dementia Date Acquired: 12/01/2016 [name] Of Treatment: 39 Clustered Wound: No Photos Photo Uploaded By: Curtis Sites on 10/13/2017 15:36:20 Wound Measurements Length: (cm) 6 Width: (cm) 4.6 Depth: (cm) 0.5 Area: (cm) 21.677 Volume: (cm) 10.838 % Reduction in Area: -421.7% % Reduction in Volume: -552.1% Epithelialization: None Tunneling: No Undermining: Yes Location 1 Starting Position (o'clock): 12 Ending Position (o'clock): 2 Maximum Distance: (cm) 2.3 Location 2 Starting Position (o'clock): 6 Ending Position (o'clock): 8 Maximum Distance: (cm) 0.6 Wound  Description Classification: Category/Stage IV Foul O Wound Margin: Flat and Intact Due to Exudate Amount: Large Slough Exudate Type: Purulent Exudate Color: yellow, brown, green dor After Cleansing: Yes Product Use: No /Fibrino Yes Wound Bed CHANI, GHANEM ([id number]) Granulation Amount: Large (67-100%) Exposed Structure Granulation Quality: Red Fascia Exposed: No Necrotic Amount: Small (1-33%) Fat Layer (Subcutaneous Tissue) Exposed: Yes Necrotic Quality: Eschar, Adherent Slough Tendon Exposed: No Muscle Exposed: Yes Necrosis of Muscle: No Joint Exposed: No Bone Exposed: No Periwound Skin Texture Texture Color No Abnormalities Noted: No No Abnormalities Noted: No Callus: No Atrophie Blanche: No Crepitus: No Cyanosis: No Excoriation: No Ecchymosis: No Induration: Yes Erythema: Yes Rash: No Erythema Location: Circumferential Scarring: No Hemosiderin Staining: No Mottled: No Moisture Pallor: No No Abnormalities Noted: No Rubor: No Dry / Scaly: No Maceration: No Temperature / Pain Temperature: No Abnormality Tenderness on Palpation: Yes Wound Preparation Ulcer Cleansing: Rinsed/Irrigated with Saline Topical Anesthetic Applied: Other: lidocaine 4%, Treatment Notes Wound #1 (Midline Coccyx) 1. Cleansed with: Clean wound with Normal Saline 2. Anesthetic Topical Lidocaine 4% cream to wound bed prior to debridement 4. Dressing Applied: Prisma Ag 5. Secondary Dressing Applied Bordered Foam Dressing Dry Gauze Electronic Signature(s) Signed: 10/13/2017 3:51:48 PM By: Curtis Sites Entered By: Curtis Sites on 10/13/2017 10:28:43 [name] ([id number]) -------------------------------------------------------------------------------- Vitals Details Patient Name: [name] Date of Service: 10/13/2017 10:15 AM Medical Record Number: [id number] Patient Account Number: [id number] Date of Birth/Sex: August 09, 1923 (84 y.o. Female) Treating RN: Curtis Sites Primary Care Kinnie Kaupp: [name] Other Clinician: Referring Jordana Dugue: [name] Treating Frida Wahlstrom/Extender: Tina [place] in Treatment: 39 Vital Signs Time Taken: 10:07 Temperature (F): 98.4 Height (in): 60 Pulse (bpm): 94 Weight (lbs): 100 Respiratory Rate (breaths/min): 16 Body Mass Index (BMI): 19.5 Blood Pressure (mmHg): 143/92 Reference Range: 80 - 120 mg / dl Electronic Signature(s)  Signed: 10/13/2017 3:51:48 PM By: Curtis Sites Entered By: Curtis Sites on 10/13/2017 10:19:07

## 2017-10-27 ENCOUNTER — Encounter: Payer: Medicare Other | Admitting: Nurse Practitioner

## 2017-10-27 DIAGNOSIS — L89153 Pressure ulcer of sacral region, stage 3: Secondary | ICD-10-CM | POA: Diagnosis not present

## 2017-10-28 NOTE — Progress Notes (Signed)
GLAYDS, INSCO ([id number]) Visit Report for 10/27/2017 Chief Complaint Document Details Patient Name: [name], [name] Date of Service: 10/27/2017 10:15 AM Medical Record Number: [id number] Patient Account Number: [id number] Date of Birth/Sex: [birth year] (84 y.o. Female) Treating RN: Curtis Sites Primary Care Provider: Vonita Moss Other Clinician: Referring Provider: Vonita Moss Treating Provider/Extender: Kathreen Cosier in Treatment: 109 Information Obtained from: Patient Chief Complaint patient is here for follow up evaluation of a sacral pressure ulcer Electronic Signature(s) Signed: 10/27/2017 4:14:39 PM By: Bonnell Public Entered By: Bonnell Public on 10/27/2017 12:28:04 [name] ([id number]) -------------------------------------------------------------------------------- Debridement Details Patient Name: [name] Date of Service: 10/27/2017 10:15 AM Medical Record Number: [id number] Patient Account Number: [id number] Date of Birth/Sex: [birth year] (84 y.o. Female) Treating RN: Curtis Sites Primary Care Provider: Vonita Moss Other Clinician: Referring Provider: Vonita Moss Treating Provider/Extender: Kathreen Cosier in Treatment: 41 Debridement Performed for Wound #1 Midline Coccyx Assessment: Performed By: Physician Bonnell Public, NP Debridement: Debridement Pre-procedure Verification/Time Yes - 10:40 Out Taken: Start Time: 10:40 Pain Control: Lidocaine 4% Topical Solution Level: Skin/Subcutaneous Tissue Total Area Debrided (L x W): 4.7 (cm) x 4.3 (cm) = 20.21 (cm) Tissue and other material Viable, Non-Viable, Fibrin/Slough, Subcutaneous debrided: Instrument: Curette Bleeding: Minimum Hemostasis Achieved: Pressure End Time: 10:42 Procedural Pain: 0 Post Procedural Pain: 0 Response to Treatment: Procedure was tolerated well Post Debridement Measurements of Total Wound Length: (cm) 4.7 Stage: Category/Stage IV Width: (cm)  4.3 Depth: (cm) 0.5 Volume: (cm) 7.936 Character of Wound/Ulcer Post Improved Debridement: Post Procedure Diagnosis Same as Pre-procedure Electronic Signature(s) Signed: 10/27/2017 4:14:39 PM By: Bonnell Public Signed: 10/27/2017 4:56:35 PM By: Curtis Sites Entered By: Curtis Sites on 10/27/2017 10:42:01 [name] ([id number]) -------------------------------------------------------------------------------- HPI Details Patient Name: [name] Date of Service: 10/27/2017 10:15 AM Medical Record Number: [id number] Patient Account Number: [id number] Date of Birth/Sex: 1923/08/12 (84 y.o. Female) Treating RN: Curtis Sites Primary Care Provider: Vonita Moss Other Clinician: Referring Provider: Vonita Moss Treating Provider/Extender: Kathreen Cosier in Treatment: 38 History of Present Illness HPI Description: 01/07/17 this is a 84 year old woman admitted to the clinic today for review of a pressure ulcer on her lower sacrum. She is referred from her primary physician's office after being seen on 3/22 with a 3 cm pressure area. Her daughter and caretaker accompanied her today state that the area first became obvious about a month ago and his since deteriorated. They have recently got [name] a home health involved and have been using [name] to the wound. They have ordered a pressure relief surface for her mattress. They are turning her religiously. They state that she eats well and they've been forcing fluids on her. She is on a multivitamin. Looking through [facility] point last albumin I see was 4.4 on 10/28. The patient has advanced parkinsonism which looks superficially like advanced Parkinson's disease although her daughter tells me she did not ever respond to Sinemet therefore this may have another pathology with signs of parkinsonism. However I think this is largely a mute point currently. She also has dementia and is nonambulatory. Since this started they  have been keeping her in bed and turning her religiously every 2 hours. She lives at home in [place] with her husband with 24/7 care giving 01/13/17 santyl change qd. Still will require further debridement. continue santyl. 01/20/17; patient's wound actually looks some better less adherent necrotic surface. There is actually visible granulation. We're using [name] 01/27/17; better-looking surface but still a lot of necrotic tissue on  the base of this wound. The periwound erythema is better than last week we are still using [name]. Her daughter tells [place] that she is still having trouble with the pressure-relief mattress through medical modalities 02/03/18; I had the patient scheduled for a two week followup however her daughter brought her in early concerned for discoloration on 2 areas of the wound circumference. We have bee using santyl 02/10/17;Better looking surface to the wound. Rim appears better suggesting better offloading. Using santyl 02/24/17; change to Silver Collegen last time. Wound appears better. 03/10/17; still using silver collagen religious offloading. Intake is satisfactory per her daughter. Dimension slightly better 03/12/2017 -- Dr. Jannetta Quint patient who had been seen 2 days ago and was doing fairly well. The patient is brought in by her daughter who noticed a new wound just above the previous wound on her sacral area and going on more to the left lateral side. She was very concerned and we asked her to get in for an opinion. 03/17/17; above is noted. The patient has developed a progressive area to the left of her original wound. This seems to this started with a ring of red skin with a more pale interior almost looking fungal. There was a rim of blister through part of the area although this did not look like zoster. They have been applying triamcinolone that was prescribed last week by Dr. Meyer Russel and the area has a fold to a linear band area which is confluent, erythematous and with  obvious epidermal swelling but there is no overt tenderness or crepitus. She has lost some surface epithelium closer to the wound surface itself and now has a more superficial wound in this area and the extending erythema goes towards the left buttock. This is well demarcated between involved in normal skin but once again does not appear to be at all tender. If there is a contact issue here I cannot get the history out of the daughter or the caregiver that are with her. 03/24/17; the patient arrives today with the wound slightly worse slightly more drainage. The bandlike area of erythema that I treated as a possible pineal infection has improved somewhat although proximally is still has confluent erythema without overt tenderness. We have been using silver alginate since the most recent deterioration. To the bandlike degree of erythema we have been using Lotrisone cream 03/31/17; patient arrives today with the wound slightly larger, necrotic surface and surrounding erythema. The bandlike area of erythema that I treated as a possible pineal infection is less swollen but still present I've been using Lotrisone cream on that largely related to the presence of a tinea looking infection when this was first seen. We've been using silver alginate. X-ray that I ordered last week showed no acute bony abnormalities mild fecal impaction. Lab work showed a comprehensive metabolic panel that was normal including an albumin of 3.8. White count was 9.5 hemoglobin 12.3 differential count normal. C-reactive protein was less than 1 and sedimentation rate and 17. The latter 2 values does not support an ongoing bacterial infection. 04/14/17; patient arrives after a 2 week hiatus. Her wound is not in good condition. Although the base of the wound looks JALIANA, MEDELLIN. ([id number]) stable she still has an erythematous area that was apparently blistered over the weekend. This again points to the left. As our intake nurse  pointed out today this is in the area where the tissue folds together and we may need to prevent try to prevent this. Lab work and  x-ray that I did to 3 weeks ago were unremarkable including her albumin nevertheless she is an extremely frail condition physically. We have have been using [name] 04/22/17; I changed her to silver alginate because of the surrounding maceration and moisture last week. The daughter did not like the way the wound looked in the middle of the week and changed her back to [place]. They're putting gauze on top of this. Thinks still using Lotrisone. 05/06/17 on evaluation today patient sacral wound appears to be doing okay and does not seem to be any worse. She is having no significant pain during evaluation today the secondary to mental status she is unable to rate or describe whether she had any pain she was not however flinching. Her daughter states that the wound does appear to be looking better to her. Still we are having difficulty with the skinfold that seems to be closing in on itself at this point. All in all I feel like she is making some good progress in the [name] seems to be the official for her. They do tell me that a refill if we are gonna continue that today. No fevers, chills, nausea, or vomiting noted at this time. 05/13/17 presents today for evaluation concerning her ongoing sacral pressure ulcer. Unfortunately she also has an area of deep tissue injury in the right Ischial region which is starting to show up. The sacral wound also continues to show signs of necrotic tissue overlying and has declined. Overall we really have not seen a significant improvement in the past several months in regard to the sacral wound and now patient is starting to develop a new wound in the right Ischial region. Obviously this is not trending in the direction that we want to see. No fevers, chills, nausea, or vomiting noted at this time. 05/19/17; I have not seen this wound and almost  a month however there is nothing really positive to say about it. Necrotic tissue over the surface which superiorly I think abuts on her sacrum. She has surrounding erythema. I would be surprised if there is not underlying osteomyelitis or soft tissue infection. 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 She apparently eats well per description although I wonder about this looking at her. Lab work I did probably 4 weeks ago however was really quite normal including a serum albumin 05/26/17; culture I did last week grew Escherichia coli and methicillin sensitive staph aureus which should've been well covered by the Augmentin and ciprofloxacin. Indeed the erythema around the wound in the bed of the wound looks somewhat better. Her intake is still satisfactory. They now have a near fluidized bed 06/02/17; they completed the antibiotics last Friday. Using collagen. Daughter still reports eating and drinking well. There is less visualized erythema around the wound. 06/16/17; large pressure ulcer over her lower sacrum and coccyx. Using [name] to the wound bed. 06/30/17; certainly no change in dimensions of this large stage III wound over her sacrum and coccyx. They've been using [name]. There is no exposed bone. She has a candidal/tinea area in the right inguinal area. Other than that her daughter relates that she is eating and drinking well there changing her positioning to make sure the areas offloaded 07/14/17; no major change in the dimensions of this large stage III wound. Initially a smaller wound that became secondarily infected causing significant tissue breakdown although it is been stable in the last several weeks. Tunneling superiorly at roughly 1:00 no change here either. There is  no bone palpable. Both the patient's daughter and caretaker states that she eats well. I have not rechecked her blood work 07/27/17; patient arrives in clinic today and generally a deteriorated looking state.  Mild fever with axillary temperature of 100.4. Daughter reports she has not been eating and drinking well since yesterday. She looks more pale and thin and less responsive. We have been using silver collagen to her wound 08/11/17; since the last time the patient was here things have gone better. Her fever went down and she started eating and drinking again. Culture I did of the wound showed methicillin sensitive staph aureus, Morganella and enterococcus. I only treated her with Keflex which would've not covered the Morganella and enterococcus however the purulent area on the 2:00 side of her wound is a lot better and the bandlike erythema that concern me also was resolved. I'd called the daughter last week to confirm that she was a lot better. She finished the Keflex last Thursday she also suffered a skin tear this morning perhaps while putting on her incontinence brief period is on the lateral aspect of her right leg. Clean wound with the surface epithelium not viable. 08/25/17; last week the patient was noted to have erythema around the wound margin and a slight fever which the patient's daughter says was 49. Our office was contacted by home health however we did not have a space to work the patient in that she went to see her primary physician Dr. Maurice March. She was not febrile during this visit on 08/21/17 there was erythema around the wound similar to last occasion. Dr. Maurice March in reference to my culture from 07/28/17 and put her on doxycycline capsules which they're opening twice a day for 10 days. 09/17/17; patient arrives today with the wound bed looking fairly well granulated. There is undermining from 4 to 6:00 although this seems to of contracted slightly. She does not have obvious infection although the daughter states there was some darkening of the wound circumference that is not evident today. They state she is eating well. They are concerned about oral thrush 09/30/16; very fibrin looking  granulated wound bed. Her undermining from 4 to 6:00 is about the same but also appears to be well granulated. She has rolled edges of senescent tissue from roughly 7 to 12:00. There is no evidence of infection [name], [name] ([id number]) 10/13/17 on evaluation today patient appears to be doing fairly well all things considered in regard to her sacral wound. There's really not a lot of significant change or improvement she does have some evidence of contusion and deep tissue injury around the left border of the wound that patient's daughter did inquire about today. Nonetheless overall the wound appears to be doing about the same in my opinion. There is no significant indication of infection there also is no significant slough noted at this point. 10/27/17 She is here in follow up evaluation of a sacral ulcer. She is accompanied by her daughter and caregiver. There is red granulation tissue throughout, persistent discoloration to left border; this appears consistent with deep tissue injury. There are multiple areas covered in foam borders, tegaderm, etc that are "preventative" with "no wounds". We will continue with prisma and continue with two week follow ups Electronic Signature(s) Signed: 10/27/2017 4:14:39 PM By: Bonnell Public Entered By: Bonnell Public on 10/27/2017 12:30:56 [name] ([id number]) -------------------------------------------------------------------------------- Physician Orders Details Patient Name: [name] Date of Service: 10/27/2017 10:15 AM Medical Record Number: [id number] Patient Account Number:  [id number] Date of Birth/Sex: [birth year] (84 y.o. Female) Treating RN: Curtis Sites Primary Care Provider: Vonita Moss Other Clinician: Referring Provider: Vonita Moss Treating Provider/Extender: Kathreen Cosier in Treatment: 74 Verbal / Phone Orders: No Diagnosis Coding Wound Cleansing Wound #1 Midline Coccyx o Clean wound with Normal  Saline. o May Shower, gently pat wound dry prior to applying new dressing. Anesthetic (add to Medication List) Wound #1 Midline Coccyx o Topical Lidocaine 4% cream applied to wound bed prior to debridement (In Clinic Only). Skin Barriers/Peri-Wound Care Wound #1 Midline Coccyx o Skin Prep Primary Wound Dressing Wound #1 Midline Coccyx o Prisma Ag - pack into undermining Secondary Dressing Wound #1 Midline Coccyx o Boardered Foam Dressing - HHRN PLEASE ORDER [employer]LEV[employer] BORDERED FOAM FOR PATIENT (OFF BRANDS IRRITATE HER SKIN) Dressing Change Frequency Wound #1 Midline Coccyx o Change dressing every day. Follow-up Appointments o Return Appointment in 2 weeks. Off-Loading Wound #1 Midline Coccyx o Roho cushion for wheelchair o Mattress - fluidized air mattress o Turn and reposition every 2 hours Additional Orders / Instructions Wound #1 Midline Coccyx o Increase protein intake. - please add protein supplements to patients diet o Other: - please add vitamin A, vitamin C and zinc supplements to patients diet [address] [name], [name] ([id number]) Wound #1 Midline Coccyx o Continue Home Health Visits - [facility] o Home Health Nurse may visit PRN to address patientos wound care needs. o FACE TO FACE ENCOUNTER: MEDICARE and MEDICAID PATIENTS: I certify that this patient is under my care and that I had a face-to-face encounter that meets the physician face-to-face encounter requirements with this patient on this date. The encounter with the patient was in whole or in part for the following MEDICAL CONDITION: (primary reason for Home Healthcare) MEDICAL NECESSITY: I certify, that based on my findings, NURSING services are a medically necessary home health service. HOME BOUND STATUS: I certify that my clinical findings support that this patient is homebound (i.e., Due to illness or injury, pt requires aid of supportive devices such as crutches, cane,  wheelchairs, walkers, the use of special transportation or the assistance of another person to leave their place of residence. There is a normal inability to leave the home and doing so requires considerable and taxing effort. Other absences are for medical reasons / religious services and are infrequent or of short duration when for other reasons). o If current dressing causes regression in wound condition, may D/C ordered dressing product/s and apply Normal Saline Moist Dressing daily until next Wound Healing Center / Other MD appointment. Notify Wound Healing Center of regression in wound condition at [phone]. o Please direct any NON-WOUND related issues/requests for orders to patient's Primary Care Physician - Dr Vonita Moss Electronic Signature(s) Signed: 10/27/2017 4:14:39 PM By: Bonnell Public Entered By: Bonnell Public on 10/27/2017 12:39:39 [name] ([id number]) -------------------------------------------------------------------------------- Problem List Details Patient Name: [name] Date of Service: 10/27/2017 10:15 AM Medical Record Number: [id number] Patient Account Number: [id number] Date of Birth/Sex: 11/12/1922 (84 y.o. Female) Treating RN: Curtis Sites Primary Care Provider: Vonita Moss Other Clinician: Referring Provider: Vonita Moss Treating Provider/Extender: Kathreen Cosier in Treatment: 79 Active Problems ICD-10 Encounter Code Description Active Date Diagnosis L89.153 Pressure ulcer of sacral region, stage 3 01/07/2017 Yes G20 Parkinson's disease 01/07/2017 Yes L03.312 Cellulitis of back [any part except buttock] 07/28/2017 Yes S81.811D Laceration without foreign body, right lower leg, subsequent 08/11/2017 Yes encounter Inactive Problems Resolved Problems Electronic Signature(s) Signed: 10/27/2017 4:14:39 PM By: Bonnell Public  Entered By: Bonnell Public on 10/27/2017 12:27:06 [name]  ([id number]) -------------------------------------------------------------------------------- Progress Note Details Patient Name: [name] Date of Service: 10/27/2017 10:15 AM Medical Record Number: [id number] Patient Account Number: [id number] Date of Birth/Sex: 07-Jan-1923 (84 y.o. Female) Treating RN: Curtis Sites Primary Care Provider: Vonita Moss Other Clinician: Referring Provider: Vonita Moss Treating Provider/Extender: Kathreen Cosier in Treatment: 86 Subjective Chief Complaint Information obtained from Patient patient is here for follow up evaluation of a sacral pressure ulcer History of Present Illness (HPI) 01/07/17 this is a 84 year old woman admitted to the clinic today for review of a pressure ulcer on her lower sacrum. She is referred from her primary physician's office after being seen on 3/22 with a 3 cm pressure area. Her daughter and caretaker accompanied her today state that the area first became obvious about a month ago and his since deteriorated. They have recently got [name] a home health involved and have been using [name] to the wound. They have ordered a pressure relief surface for her mattress. They are turning her religiously. They state that she eats well and they've been forcing fluids on her. She is on a multivitamin. Looking through [facility] point last albumin I see was 4.4 on 10/28. The patient has advanced parkinsonism which looks superficially like advanced Parkinson's disease although her daughter tells me she did not ever respond to Sinemet therefore this may have another pathology with signs of parkinsonism. However I think this is largely a mute point currently. She also has dementia and is nonambulatory. Since this started they have been keeping her in bed and turning her religiously every 2 hours. She lives at home in [place] with her husband with 24/7 care giving 01/13/17 santyl change qd. Still will require further  debridement. continue santyl. 01/20/17; patient's wound actually looks some better less adherent necrotic surface. There is actually visible granulation. We're using [name] 01/27/17; better-looking surface but still a lot of necrotic tissue on the base of this wound. The periwound erythema is better than last week we are still using [name]. Her daughter tells [place] that she is still having trouble with the pressure-relief mattress through medical modalities 02/03/18; I had the patient scheduled for a two week followup however her daughter brought her in early concerned for discoloration on 2 areas of the wound circumference. We have bee using santyl 02/10/17;Better looking surface to the wound. Rim appears better suggesting better offloading. Using santyl 02/24/17; change to Silver Collegen last time. Wound appears better. 03/10/17; still using silver collagen religious offloading. Intake is satisfactory per her daughter. Dimension slightly better 03/12/2017 -- Dr. Jannetta Quint patient who had been seen 2 days ago and was doing fairly well. The patient is brought in by her daughter who noticed a new wound just above the previous wound on her sacral area and going on more to the left lateral side. She was very concerned and we asked her to get in for an opinion. 03/17/17; above is noted. The patient has developed a progressive area to the left of her original wound. This seems to this started with a ring of red skin with a more pale interior almost looking fungal. There was a rim of blister through part of the area although this did not look like zoster. They have been applying triamcinolone that was prescribed last week by Dr. Meyer Russel and the area has a fold to a linear band area which is confluent, erythematous and with obvious epidermal swelling but there is no overt  tenderness or crepitus. She has lost some surface epithelium closer to the wound surface itself and now has a more superficial wound in this area  and the extending erythema goes towards the left buttock. This is well demarcated between involved in normal skin but once again does not appear to be at all tender. If there is a contact issue here I cannot get the history out of the daughter or the caregiver that are with her. 03/24/17; the patient arrives today with the wound slightly worse slightly more drainage. The bandlike area of erythema that I treated as a possible pineal infection has improved somewhat although proximally is still has confluent erythema without overt tenderness. We have been using silver alginate since the most recent deterioration. To the bandlike degree of erythema we have been using Lotrisone cream 03/31/17; patient arrives today with the wound slightly larger, necrotic surface and surrounding erythema. The bandlike area of erythema that I treated as a possible pineal infection is less swollen but still present I've been using Lotrisone cream on that largely related to the presence of a tinea looking infection when this was first seen. We've been using silver alginate. [name], [name] ([id number]) X-ray that I ordered last week showed no acute bony abnormalities mild fecal impaction. Lab work showed a comprehensive metabolic panel that was normal including an albumin of 3.8. White count was 9.5 hemoglobin 12.3 differential count normal. C-reactive protein was less than 1 and sedimentation rate and 17. The latter 2 values does not support an ongoing bacterial infection. 04/14/17; patient arrives after a 2 week hiatus. Her wound is not in good condition. Although the base of the wound looks stable she still has an erythematous area that was apparently blistered over the weekend. This again points to the left. As our intake nurse pointed out today this is in the area where the tissue folds together and we may need to prevent try to prevent this. Lab work and x-ray that I did to 3 weeks ago were unremarkable including her  albumin nevertheless she is an extremely frail condition physically. We have have been using [name] 04/22/17; I changed her to silver alginate because of the surrounding maceration and moisture last week. The daughter did not like the way the wound looked in the middle of the week and changed her back to [place]. They're putting gauze on top of this. Thinks still using Lotrisone. 05/06/17 on evaluation today patient sacral wound appears to be doing okay and does not seem to be any worse. She is having no significant pain during evaluation today the secondary to mental status she is unable to rate or describe whether she had any pain she was not however flinching. Her daughter states that the wound does appear to be looking better to her. Still we are having difficulty with the skinfold that seems to be closing in on itself at this point. All in all I feel like she is making some good progress in the [name] seems to be the official for her. They do tell me that a refill if we are gonna continue that today. No fevers, chills, nausea, or vomiting noted at this time. 05/13/17 presents today for evaluation concerning her ongoing sacral pressure ulcer. Unfortunately she also has an area of deep tissue injury in the right Ischial region which is starting to show up. The sacral wound also continues to show signs of necrotic tissue overlying and has declined. Overall we really have not seen a significant improvement in  the past several months in regard to the sacral wound and now patient is starting to develop a new wound in the right Ischial region. Obviously this is not trending in the direction that we want to see. No fevers, chills, nausea, or vomiting noted at this time. 05/19/17; I have not seen this wound and almost a month however there is nothing really positive to say about it. Necrotic tissue over the surface which superiorly I think abuts on her sacrum. She has surrounding erythema. I would be  surprised if there is not underlying osteomyelitis or soft tissue infection. 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 She apparently eats well per description although I wonder about this looking at her. Lab work I did probably 4 weeks ago however was really quite normal including a serum albumin 05/26/17; culture I did last week grew Escherichia coli and methicillin sensitive staph aureus which should've been well covered by the Augmentin and ciprofloxacin. Indeed the erythema around the wound in the bed of the wound looks somewhat better. Her intake is still satisfactory. They now have a near fluidized bed 06/02/17; they completed the antibiotics last Friday. Using collagen. Daughter still reports eating and drinking well. There is less visualized erythema around the wound. 06/16/17; large pressure ulcer over her lower sacrum and coccyx. Using [name] to the wound bed. 06/30/17; certainly no change in dimensions of this large stage III wound over her sacrum and coccyx. They've been using [name]. There is no exposed bone. She has a candidal/tinea area in the right inguinal area. Other than that her daughter relates that she is eating and drinking well there changing her positioning to make sure the areas offloaded 07/14/17; no major change in the dimensions of this large stage III wound. Initially a smaller wound that became secondarily infected causing significant tissue breakdown although it is been stable in the last several weeks. Tunneling superiorly at roughly 1:00 no change here either. There is no bone palpable. Both the patient's daughter and caretaker states that she eats well. I have not rechecked her blood work 07/27/17; patient arrives in clinic today and generally a deteriorated looking state. Mild fever with axillary temperature of 100.4. Daughter reports she has not been eating and drinking well since yesterday. She looks more pale and thin and less responsive. We have been  using silver collagen to her wound 08/11/17; since the last time the patient was here things have gone better. Her fever went down and she started eating and drinking again. Culture I did of the wound showed methicillin sensitive staph aureus, Morganella and enterococcus. I only treated her with Keflex which would've not covered the Morganella and enterococcus however the purulent area on the 2:00 side of her wound is a lot better and the bandlike erythema that concern me also was resolved. I'd called the daughter last week to confirm that she was a lot better. She finished the Keflex last Thursday she also suffered a skin tear this morning perhaps while putting on her incontinence brief period is on the lateral aspect of her right leg. Clean wound with the surface epithelium not viable. 08/25/17; last week the patient was noted to have erythema around the wound margin and a slight fever which the patient's daughter says was 37. Our office was contacted by home health however we did not have a space to work the patient in that she went to see her primary physician Dr. Maurice March. She was not febrile during this visit on  08/21/17 there was erythema around the wound similar to last occasion. Dr. Maurice MarchLane in reference to my culture from 07/28/17 and put her on doxycycline capsules which they're opening twice a day for 10 days. Tina MainlandSHAW, Janalynn W. ([id number]) 09/17/17; patient arrives today with the wound bed looking fairly well granulated. There is undermining from 4 to 6:00 although this seems to of contracted slightly. She does not have obvious infection although the daughter states there was some darkening of the wound circumference that is not evident today. They state she is eating well. They are concerned about oral thrush 09/30/16; very fibrin looking granulated wound bed. Her undermining from 4 to 6:00 is about the same but also appears to be well granulated. She has rolled edges of senescent tissue from  roughly 7 to 12:00. There is no evidence of infection 10/13/17 on evaluation today patient appears to be doing fairly well all things considered in regard to her sacral wound. There's really not a lot of significant change or improvement she does have some evidence of contusion and deep tissue injury around the left border of the wound that patient's daughter did inquire about today. Nonetheless overall the wound appears to be doing about the same in my opinion. There is no significant indication of infection there also is no significant slough noted at this point. 10/27/17 She is here in follow up evaluation of a sacral ulcer. She is accompanied by her daughter and caregiver. There is red granulation tissue throughout, persistent discoloration to left border; this appears consistent with deep tissue injury. There are multiple areas covered in foam borders, tegaderm, etc that are "preventative" with "no wounds". We will continue with prisma and continue with two week follow ups Patient History Unable to Obtain Patient History due to Altered Mental Status. Information obtained from Patient. Social History Never smoker, Marital Status - Married, Alcohol Use - Never, Drug Use - No History, Caffeine Use - Never. Medical And Surgical History Notes Ear/Nose/Mouth/Throat dysphagia Neurologic parkinsons, tremor Objective Constitutional Vitals Time Taken: 10:19 AM, Height: 60 in, Weight: 100 lbs, BMI: 19.5, Temperature: 98.5 F, Pulse: 87 bpm, Respiratory Rate: 14 breaths/min, Blood Pressure: 152/93 mmHg. Integumentary (Hair, Skin) Wound #1 status is Open. Original cause of wound was Pressure Injury. The wound is located on the Midline Coccyx. The wound measures 4.7cm length x 4.3cm width x 0.4cm depth; 15.873cm^2 area and 6.349cm^3 volume. There is muscle and Fat Layer (Subcutaneous Tissue) Exposed exposed. There is no tunneling noted, however, there is undermining starting at 6:00 and ending at  8:00 with a maximum distance of 0.8cm. There is a large amount of purulent drainage noted. Foul odor after cleansing was noted. The wound margin is flat and intact. There is large (67-100%) red granulation within the wound bed. There is a small (1-33%) amount of necrotic tissue within the wound bed including Eschar and Adherent Slough. The periwound skin appearance exhibited: Induration, Erythema. The periwound skin appearance did not exhibit: Callus, Crepitus, Excoriation, Rash, Scarring, Dry/Scaly, Maceration, Atrophie Blanche, Cyanosis, Ecchymosis, Hemosiderin Staining, Mottled, Fare, Irelynn W. ([id number]) Pallor, Rubor. The surrounding wound skin color is noted with erythema which is circumferential. Periwound temperature was noted as No Abnormality. The periwound has tenderness on palpation. Assessment Active Problems ICD-10 L89.153 - Pressure ulcer of sacral region, stage 3 G20 - Parkinson's disease L03.312 - Cellulitis of back [any part except buttock] S81.811D - Laceration without foreign body, right lower leg, subsequent encounter Procedures Wound #1 Pre-procedure diagnosis of Wound #1 is a Pressure  Ulcer located on the Midline Coccyx . There was a Skin/Subcutaneous Tissue Debridement ([id number]) debridement with total area of 20.21 sq cm performed by Bonnell Public, NP. with the following instrument(s): Curette to remove Viable and Non-Viable tissue/material including Fibrin/Slough and Subcutaneous after achieving pain control using Lidocaine 4% Topical Solution. A time out was conducted at 10:40, prior to the start of the procedure. A Minimum amount of bleeding was controlled with Pressure. The procedure was tolerated well with a pain level of 0 throughout and a pain level of 0 following the procedure. Post Debridement Measurements: 4.7cm length x 4.3cm width x 0.5cm depth; 7.936cm^3 volume. Post debridement Stage noted as Category/Stage IV. Character of Wound/Ulcer Post  Debridement is improved. Post procedure Diagnosis Wound #1: Same as Pre-Procedure Plan Wound Cleansing: Wound #1 Midline Coccyx: Clean wound with Normal Saline. May Shower, gently pat wound dry prior to applying new dressing. Anesthetic (add to Medication List): Wound #1 Midline Coccyx: Topical Lidocaine 4% cream applied to wound bed prior to debridement (In Clinic Only). Skin Barriers/Peri-Wound Care: Wound #1 Midline Coccyx: Skin Prep Primary Wound Dressing: Wound #1 Midline Coccyx: Prisma Ag - pack into undermining Secondary Dressing: Wound #1 Midline Coccyx: Boardered Foam Dressing - HHRN PLEASE ORDER ALLEVYN LIFE BORDERED FOAM FOR PATIENT (OFF BRANDS IRRITATE HER SKIN) [name], [name]. ([id number]) Dressing Change Frequency: Wound #1 Midline Coccyx: Change dressing every day. Follow-up Appointments: Return Appointment in 2 weeks. Off-Loading: Wound #1 Midline Coccyx: Roho cushion for wheelchair Mattress - fluidized air mattress Turn and reposition every 2 hours Additional Orders / Instructions: Wound #1 Midline Coccyx: Increase protein intake. - please add protein supplements to patients diet Other: - please add vitamin A, vitamin C and zinc supplements to patients diet Home Health: Wound #1 Midline Coccyx: Continue Home Health Visits - [facility] Nurse may visit PRN to address patient s wound care needs. FACE TO FACE ENCOUNTER: MEDICARE and MEDICAID PATIENTS: I certify that this patient is under my care and that I had a face-to-face encounter that meets the physician face-to-face encounter requirements with this patient on this date. The encounter with the patient was in whole or in part for the following MEDICAL CONDITION: (primary reason for Home Healthcare) MEDICAL NECESSITY: I certify, that based on my findings, NURSING services are a medically necessary home health service. HOME BOUND STATUS: I certify that my clinical findings support that this patient  is homebound (i.e., Due to illness or injury, pt requires aid of supportive devices such as crutches, cane, wheelchairs, walkers, the use of special transportation or the assistance of another person to leave their place of residence. There is a normal inability to leave the home and doing so requires considerable and taxing effort. Other absences are for medical reasons / religious services and are infrequent or of short duration when for other reasons). If current dressing causes regression in wound condition, may D/C ordered dressing product/s and apply Normal Saline Moist Dressing daily until next Wound Healing Center / Other MD appointment. Notify Wound Healing Center of regression in wound condition at [phone]. Please direct any NON-WOUND related issues/requests for orders to patient's Primary Care Physician - Dr Vonita Moss 1. continue with prisma 2. continue with aggressive offloading 3. follow up next week Electronic Signature(s) Signed: 10/27/2017 4:14:39 PM By: Bonnell Public Entered By: Bonnell Public on 10/27/2017 13:40:26 [name] ([id number]) -------------------------------------------------------------------------------- ROS/PFSH Details Patient Name: [name] Date of Service: 10/27/2017 10:15 AM Medical Record Number: [id number] Patient Account Number: [id number]  Date of Birth/Sex: 28-Jun-1923 (84 y.o. Female) Treating RN: Curtis Sites Primary Care Provider: Vonita Moss Other Clinician: Referring Provider: Vonita Moss Treating Provider/Extender: Kathreen Cosier in Treatment: 28 Unable to Obtain Patient History due to oo Altered Mental Status Information Obtained From Patient Wound History Do you currently have one or more open woundso Yes How many open wounds do you currently haveo 1 Approximately how long have you had your woundso 1 week How have you been treating your wound(s) until nowo santyl and gauze Has your wound(s) ever healed  and then re-openedo No Have you had any lab work done in the past montho No Have you tested positive for an antibiotic resistant organism (MRSA, VRE)o No Have you tested positive for osteomyelitis (bone infection)o No Have you had any tests for circulation on your legso No Eyes Medical History: Negative for: Cataracts; Glaucoma; Optic Neuritis Ear/Nose/Mouth/Throat Medical History: Negative for: Chronic sinus problems/congestion; Middle ear problems Past Medical History Notes: dysphagia Hematologic/Lymphatic Medical History: Positive for: Anemia Negative for: Hemophilia; Human Immunodeficiency Virus; Lymphedema; Sickle Cell Disease Respiratory Medical History: Negative for: Aspiration; Asthma; Chronic Obstructive Pulmonary Disease (COPD); Pneumothorax; Sleep Apnea; Tuberculosis Cardiovascular Medical History: Positive for: Hypertension Negative for: Angina; Arrhythmia; Congestive Heart Failure; Coronary Artery Disease; Deep Vein Thrombosis; Hypotension; Myocardial Infarction; Peripheral Arterial Disease; Peripheral Venous Disease; Phlebitis; Vasculitis Gastrointestinal [name], LAGRANGE. ([id number]) Medical History: Negative for: Cirrhosis ; Colitis; Crohnos; Hepatitis A; Hepatitis B; Hepatitis C Endocrine Medical History: Negative for: Type I Diabetes; Type II Diabetes Genitourinary Medical History: Negative for: End Stage Renal Disease Immunological Medical History: Negative for: Lupus Erythematosus; Raynaudos; Scleroderma Integumentary (Skin) Medical History: Negative for: History of Burn; History of pressure wounds Musculoskeletal Medical History: Negative for: Gout; Rheumatoid Arthritis; Osteoarthritis; Osteomyelitis Neurologic Medical History: Positive for: Dementia Negative for: Neuropathy; Quadriplegia; Paraplegia; Seizure Disorder Past Medical History Notes: parkinsons, tremor Oncologic Medical History: Negative for: Received Chemotherapy; Received  Radiation Psychiatric Medical History: Negative for: Anorexia/bulimia; Confinement Anxiety Immunizations Pneumococcal Vaccine: Received Pneumococcal Vaccination: Yes Immunization Notes: up to date Implantable Devices Family and Social History Never smoker; Marital Status - Married; Alcohol Use: Never; Drug Use: No History; Caffeine Use: Never; Financial Concerns: No; Food, Clothing or Shelter Needs: No; Support System Lacking: No; Transportation Concerns: No; Advanced Directives: Yes (Not Provided); Patient does not want information on Advanced Directives; Medical Power of Attorney: Yes - dtr (Not Provided) [name], [name] ([id number]) Physician Affirmation I have reviewed and agree with the above information. Electronic Signature(s) Signed: 10/27/2017 4:14:39 PM By: Bonnell Public Signed: 10/27/2017 4:56:35 PM By: Curtis Sites Entered By: Bonnell Public on 10/27/2017 12:39:26 [name] ([id number]) -------------------------------------------------------------------------------- SuperBill Details Patient Name: [name] Date of Service: 10/27/2017 Medical Record Number: [id number] Patient Account Number: [id number] Date of Birth/Sex: [birth year] (84 y.o. Female) Treating RN: Curtis Sites Primary Care Provider: Vonita Moss Other Clinician: Referring Provider: Vonita Moss Treating Provider/Extender: Kathreen Cosier in Treatment: 41 Diagnosis Coding ICD-10 Codes Code Description L89.153 Pressure ulcer of sacral region, stage 3 G20 Parkinson's disease L03.312 Cellulitis of back [any part except buttock] S81.811D Laceration without foreign body, right lower leg, subsequent encounter Facility Procedures CPT4 Code: [id number] Description: 11042 - DEB SUBQ TISSUE 20 SQ CM/< ICD-10 Diagnosis Description L89.153 Pressure ulcer of sacral region, stage 3 G20 Parkinson's disease Modifier: Quantity: 1 CPT4 Code: [id number] Description: 11045 - DEB SUBQ TISS EA ADDL  20CM ICD-10 Diagnosis Description L89.153 Pressure ulcer of sacral region, stage 3 G20 Parkinson's disease Modifier: Quantity: 1 Physician Procedures CPT4  Code: [id number] Description: 11042 - WC PHYS SUBQ TISS 20 SQ CM ICD-10 Diagnosis Description L89.153 Pressure ulcer of sacral region, stage 3 G20 Parkinson's disease Modifier: Quantity: 1 CPT4 Code: [id number] Description: 11045 - WC PHYS SUBQ TISS EA ADDL 20 CM ICD-10 Diagnosis Description L89.153 Pressure ulcer of sacral region, stage 3 G20 Parkinson's disease Modifier: Quantity: 1 Electronic Signature(s) Signed: 10/27/2017 4:14:39 PM By: Bonnell Public Entered By: Bonnell Public on 10/27/2017 13:48:21

## 2017-11-01 NOTE — Progress Notes (Signed)
VETTA, COUZENS ([id number]) Visit Report for 10/27/2017 Arrival Information Details Patient Name: [name], [name] Date of Service: 10/27/2017 10:15 AM Medical Record Number: [id number] Patient Account Number: [id number] Date of Birth/Sex: [birth year] (84 y.o. Female) Treating RN: Curtis Sites Primary Care Natasja Niday: Vonita Moss Other Clinician: Referring Cam Dauphin: Vonita Moss Treating Corabelle Spackman/Extender: Kathreen Cosier in Treatment: 29 Visit Information History Since Last Visit Added or deleted any medications: No Patient Arrived: Wheel Chair Any new allergies or adverse reactions: No Arrival Time: 10:16 Had a fall or experienced change in No activities of daily living that may affect Accompanied By: dtr risk of falls: Transfer Assistance: Manual Signs or symptoms of abuse/neglect since No Patient Identification Verified: Yes last visito Secondary Verification Process Completed: Yes Hospitalized since last visit: No Patient Requires Transmission-Based No Has Dressing in Place as Prescribed: Yes Precautions: Pain Present Now: Unable to Patient Has Alerts: No Respond Electronic Signature(s) Signed: 10/27/2017 4:56:35 PM By: Curtis Sites Entered By: Curtis Sites on 10/27/2017 10:19:36 [name] ([id number]) -------------------------------------------------------------------------------- Encounter Discharge Information Details Patient Name: [name] Date of Service: 10/27/2017 10:15 AM Medical Record Number: [id number] Patient Account Number: [id number] Date of Birth/Sex: Apr 12, 1923 (84 y.o. Female) Treating RN: Curtis Sites Primary Care Jahrell Hamor: Vonita Moss Other Clinician: Referring Shankar Silber: Vonita Moss Treating Davina Howlett/Extender: Kathreen Cosier in Treatment: 13 Encounter Discharge Information Items Discharge Pain Level: 0 Discharge Condition: Stable Ambulatory Status: Wheelchair Discharge Destination: Home Transportation:  Private Auto Accompanied By: dtr Schedule Follow-up Appointment: Yes Medication Reconciliation completed and No provided to Patient/Care Ariatna Jester: Provided on Clinical Summary of Care: 10/27/2017 Form Type Recipient Paper Patient ES Electronic Signature(s) Signed: 10/30/2017 4:27:15 PM By: Gwenlyn Perking Entered By: Gwenlyn Perking on 10/27/2017 10:57:48 [name] ([id number]) -------------------------------------------------------------------------------- Multi Wound Chart Details Patient Name: [name] Date of Service: 10/27/2017 10:15 AM Medical Record Number: [id number] Patient Account Number: [id number] Date of Birth/Sex: 09-19-1923 (84 y.o. Female) Treating RN: Curtis Sites Primary Care Nadira Single: Vonita Moss Other Clinician: Referring Leisha Trinkle: Vonita Moss Treating Mahmoud Blazejewski/Extender: Kathreen Cosier in Treatment: 41 Vital Signs Height(in): 60 Pulse(bpm): 87 Weight(lbs): 100 Blood Pressure(mmHg): 152/93 Body Mass Index(BMI): 20 Temperature(F): 98.5 Respiratory Rate 14 (breaths/min): Photos: [N/A:N/A] Wound Location: Coccyx - Midline N/A N/A Wounding Event: Pressure Injury N/A N/A Primary Etiology: Pressure Ulcer N/A N/A Comorbid History: Anemia, Hypertension, N/A N/A Dementia Date Acquired: 12/01/2016 N/A N/A Weeks of Treatment: 41 N/A N/A Wound Status: Open N/A N/A Measurements L x W x D 4.7x4.3x0.4 N/A N/A (cm) Area (cm) : 15.873 N/A N/A Volume (cm) : 6.349 N/A N/A % Reduction in Area: -282.00% N/A N/A % Reduction in Volume: -282.00% N/A N/A Starting Position 1 6 (o'clock): Ending Position 1 8 (o'clock): Maximum Distance 1 (cm): 0.8 Undermining: Yes N/A N/A Classification: Category/Stage IV N/A N/A Exudate Amount: Large N/A N/A Exudate Type: Purulent N/A N/A Exudate Color: yellow, brown, green N/A N/A Foul Odor After Cleansing: Yes N/A N/A Odor Anticipated Due to No N/A N/A Product Use: Wound Margin: Flat and Intact N/A  N/A Granulation Amount: Large (67-100%) N/A N/A OLUWADEMILADE, KELLETT ([id number]) Granulation Quality: Red N/A N/A Necrotic Amount: Small (1-33%) N/A N/A Necrotic Tissue: Eschar, Adherent Slough N/A N/A Exposed Structures: Fat Layer (Subcutaneous N/A N/A Tissue) Exposed: Yes Muscle: Yes Fascia: No Tendon: No Joint: No Bone: No Epithelialization: None N/A N/A Debridement: Debridement ([id number]) N/A N/A Pre-procedure 10:40 N/A N/A Verification/Time Out Taken: Pain Control: Lidocaine 4% Topical Solution N/A N/A Tissue Debrided: Fibrin/Slough, Subcutaneous N/A N/A Level: Skin/Subcutaneous Tissue  N/A N/A Debridement Area (sq cm): 20.21 N/A N/A Instrument: Curette N/A N/A Bleeding: Minimum N/A N/A Hemostasis Achieved: Pressure N/A N/A Procedural Pain: 0 N/A N/A Post Procedural Pain: 0 N/A N/A Debridement Treatment Procedure was tolerated well N/A N/A Response: Post Debridement 4.7x4.3x0.5 N/A N/A Measurements L x W x D (cm) Post Debridement Volume: 7.936 N/A N/A (cm) Post Debridement Stage: Category/Stage IV N/A N/A Periwound Skin Texture: Induration: Yes N/A N/A Excoriation: No Callus: No Crepitus: No Rash: No Scarring: No Periwound Skin Moisture: Maceration: No N/A N/A Dry/Scaly: No Periwound Skin Color: Erythema: Yes N/A N/A Atrophie Blanche: No Cyanosis: No Ecchymosis: No Hemosiderin Staining: No Mottled: No Pallor: No Rubor: No Erythema Location: Circumferential N/A N/A Temperature: No Abnormality N/A N/A Tenderness on Palpation: Yes N/A N/A Wound Preparation: Ulcer Cleansing: N/A N/A Rinsed/Irrigated with Saline Topical Anesthetic Applied: Other: lidocaine 4% Procedures Performed: Debridement N/A N/A Tina MainlandSHAW, [name] ([id number]) Treatment Notes Wound #1 (Midline Coccyx) 1. Cleansed with: Clean wound with Normal Saline 2. Anesthetic Topical Lidocaine 4% cream to wound bed prior to debridement 3. Peri-wound Care: Skin Prep 4. Dressing  Applied: Prisma Ag 5. Secondary Dressing Applied Bordered Foam Dressing Dry Gauze Electronic Signature(s) Signed: 10/27/2017 4:14:39 PM By: Bonnell Publicoulter, Leah Entered By: Bonnell Publicoulter, Leah on 10/27/2017 12:27:18 Tina MainlandSHAW, [name] ([id number]) -------------------------------------------------------------------------------- Multi-Disciplinary Care Plan Details Patient Name: Tina MainlandSHAW, Lilybelle W. Date of Service: 10/27/2017 10:15 AM Medical Record Number: [id number] Patient Account Number: [id number] Date of Birth/Sex: 1922-10-03 (84 y.o. Female) Treating RN: Curtis Sitesorthy, Joanna Primary Care Jae Bruck: Vonita MossRISSMAN, MARK Other Clinician: Referring Cung Masterson: Vonita MossRISSMAN, MARK Treating Kammi Hechler/Extender: Kathreen Cosieroulter, Leah Weeks in Treatment: 4041 Active Inactive ` Abuse / Safety / Falls / Self Care Management Nursing Diagnoses: Impaired physical mobility Potential for falls Goals: Patient will remain injury free Date Initiated: 01/07/2017 Target Resolution Date: 04/03/2017 Goal Status: Active Interventions: Assess fall risk on admission and as needed Notes: ` Nutrition Nursing Diagnoses: Potential for alteratiion in Nutrition/Potential for imbalanced nutrition Goals: Patient/caregiver agrees to and verbalizes understanding of need to use nutritional supplements and/or vitamins as prescribed Date Initiated: 01/07/2017 Target Resolution Date: 04/03/2017 Goal Status: Active Interventions: Assess patient nutrition upon admission and as needed per policy Notes: ` Orientation to the Wound Care Program Nursing Diagnoses: Knowledge deficit related to the wound healing center program Goals: Patient/caregiver will verbalize understanding of the Wound Healing Center Program Date Initiated: 01/07/2017 Target Resolution Date: 04/03/2017 Goal Status: Active Tina MainlandSHAW, [name] ([id number]) Interventions: Provide education on orientation to the wound center Notes: ` Pressure Nursing Diagnoses: Knowledge deficit related to  causes and risk factors for pressure ulcer development Goals: Patient will remain free from development of additional pressure ulcers Date Initiated: 01/07/2017 Target Resolution Date: 04/03/2017 Goal Status: Active Interventions: Assess potential for pressure ulcer upon admission and as needed Notes: ` Wound/Skin Impairment Nursing Diagnoses: Impaired tissue integrity Goals: Patient/caregiver will verbalize understanding of skin care regimen Date Initiated: 01/07/2017 Target Resolution Date: 04/03/2017 Goal Status: Active Ulcer/skin breakdown will have a volume reduction of 30% by week 4 Date Initiated: 01/07/2017 Target Resolution Date: 04/03/2017 Goal Status: Active Ulcer/skin breakdown will have a volume reduction of 50% by week 8 Date Initiated: 01/07/2017 Target Resolution Date: 04/03/2017 Goal Status: Active Ulcer/skin breakdown will have a volume reduction of 80% by week 12 Date Initiated: 01/07/2017 Target Resolution Date: 04/03/2017 Goal Status: Active Ulcer/skin breakdown will heal within 14 weeks Date Initiated: 01/07/2017 Target Resolution Date: 04/03/2017 Goal Status: Active Interventions: Assess patient/caregiver ability to obtain necessary supplies Assess patient/caregiver ability to perform  ulcer/skin care regimen upon admission and as needed Assess ulceration(s) every visit Notes: Electronic Signature(s) [name], [name] ([id number]) Signed: 10/27/2017 4:56:35 PM By: Curtis Sites Entered By: Curtis Sites on 10/27/2017 10:31:54 [name] ([id number]) -------------------------------------------------------------------------------- Pain Assessment Details Patient Name: [name] Date of Service: 10/27/2017 10:15 AM Medical Record Number: [id number] Patient Account Number: [id number] Date of Birth/Sex: 12-05-1922 (84 y.o. Female) Treating RN: Curtis Sites Primary Care Denece Shearer: Vonita Moss Other Clinician: Referring Edgard Debord: Vonita Moss Treating Lashara Urey/Extender: Kathreen Cosier in Treatment: 49 Active Problems Location of Pain Severity and Description of Pain Patient Has Paino Patient Unable to Respond Site Locations Pain Management and Medication Current Pain Management: Electronic Signature(s) Signed: 10/27/2017 4:56:35 PM By: Curtis Sites Entered By: Curtis Sites on 10/27/2017 10:19:42 [name] ([id number]) -------------------------------------------------------------------------------- Patient/Caregiver Education Details Patient Name: [name] Date of Service: 10/27/2017 10:15 AM Medical Record Number: [id number] Patient Account Number: [id number] Date of Birth/Gender: Apr 13, 1923 (84 y.o. Female) Treating RN: Curtis Sites Primary Care Physician: Vonita Moss Other Clinician: Referring Physician: Vonita Moss Treating Physician/Extender: Kathreen Cosier in Treatment: 64 Education Assessment Education Provided To: Caregiver Education Topics Provided Wound/Skin Impairment: Handouts: Other: wound care to continue as ordered Methods: Demonstration, Explain/Verbal Responses: State content correctly Electronic Signature(s) Signed: 10/27/2017 4:56:35 PM By: Curtis Sites Entered By: Curtis Sites on 10/27/2017 10:54:14 [name] ([id number]) -------------------------------------------------------------------------------- Wound Assessment Details Patient Name: [name] Date of Service: 10/27/2017 10:15 AM Medical Record Number: [id number] Patient Account Number: [id number] Date of Birth/Sex: 03/27/1923 (84 y.o. Female) Treating RN: Curtis Sites Primary Care Jame Seelig: Vonita Moss Other Clinician: Referring Kaori Jumper: Vonita Moss Treating Gustave Lindeman/Extender: Kathreen Cosier in Treatment: 41 Wound Status Wound Number: 1 Primary Etiology: Pressure Ulcer Wound Location: Coccyx - Midline Wound Status: Open Wounding Event: Pressure  Injury Comorbid History: Anemia, Hypertension, Dementia Date Acquired: 12/01/2016 Weeks Of Treatment: 41 Clustered Wound: No Photos Photo Uploaded By: Curtis Sites on 10/27/2017 11:40:55 Wound Measurements Length: (cm) 4.7 Width: (cm) 4.3 Depth: (cm) 0.4 Area: (cm) 15.873 Volume: (cm) 6.349 % Reduction in Area: -282% % Reduction in Volume: -282% Epithelialization: None Tunneling: No Undermining: Yes Starting Position (o'clock): 6 Ending Position (o'clock): 8 Maximum Distance: (cm) 0.8 Wound Description Classification: Category/Stage IV Wound Margin: Flat and Intact Exudate Amount: Large Exudate Type: Purulent Exudate Color: yellow, brown, green Foul Odor After Cleansing: Yes Due to Product Use: No Slough/Fibrino Yes Wound Bed Granulation Amount: Large (67-100%) Exposed Structure Granulation Quality: Red Fascia Exposed: No Necrotic Amount: Small (1-33%) Fat Layer (Subcutaneous Tissue) Exposed: Yes Necrotic Quality: Eschar, Adherent Slough Tendon Exposed: No Muscle Exposed: Yes [name], WIESER. ([id number]) Necrosis of Muscle: No Joint Exposed: No Bone Exposed: No Periwound Skin Texture Texture Color No Abnormalities Noted: No No Abnormalities Noted: No Callus: No Atrophie Blanche: No Crepitus: No Cyanosis: No Excoriation: No Ecchymosis: No Induration: Yes Erythema: Yes Rash: No Erythema Location: Circumferential Scarring: No Hemosiderin Staining: No Mottled: No Moisture Pallor: No No Abnormalities Noted: No Rubor: No Dry / Scaly: No Maceration: No Temperature / Pain Temperature: No Abnormality Tenderness on Palpation: Yes Wound Preparation Ulcer Cleansing: Rinsed/Irrigated with Saline Topical Anesthetic Applied: Other: lidocaine 4%, Treatment Notes Wound #1 (Midline Coccyx) 1. Cleansed with: Clean wound with Normal Saline 2. Anesthetic Topical Lidocaine 4% cream to wound bed prior to debridement 3. Peri-wound Care: Skin Prep 4.  Dressing Applied: Prisma Ag 5. Secondary Dressing Applied Bordered Foam Dressing Dry Gauze Electronic Signature(s) Signed: 10/27/2017 4:56:35 PM By: Curtis Sites Entered By: Francesco Sor,  Joanna on 10/27/2017 10:31:41 [name], [name] ([id number]) -------------------------------------------------------------------------------- Vitals Details Patient Name: [name] Date of Service: 10/27/2017 10:15 AM Medical Record Number: [id number] Patient Account Number: [id number] Date of Birth/Sex: 21-Jan-1923 (84 y.o. Female) Treating RN: Curtis Sites Primary Care Kitiara Hintze: Vonita Moss Other Clinician: Referring Trei Schoch: Vonita Moss Treating Chari Parmenter/Extender: Kathreen Cosier in Treatment: 41 Vital Signs Time Taken: 10:19 Temperature (F): 98.5 Height (in): 60 Pulse (bpm): 87 Weight (lbs): 100 Respiratory Rate (breaths/min): 14 Body Mass Index (BMI): 19.5 Blood Pressure (mmHg): 152/93 Reference Range: 80 - 120 mg / dl Electronic Signature(s) Signed: 10/27/2017 4:56:35 PM By: Curtis Sites Entered By: Curtis Sites on 10/27/2017 10:20:06

## 2017-11-08 ENCOUNTER — Other Ambulatory Visit: Payer: Self-pay

## 2017-11-08 ENCOUNTER — Inpatient Hospital Stay
Admission: EM | Admit: 2017-11-08 | Discharge: 2017-11-11 | DRG: 871 | Disposition: A | Discharge status: Home / Self Care | Payer: Medicare Other | Attending: Internal Medicine | Admitting: Internal Medicine

## 2017-11-08 ENCOUNTER — Emergency Department: Payer: Medicare Other

## 2017-11-08 DIAGNOSIS — Z882 Allergy status to sulfonamides status: Secondary | ICD-10-CM

## 2017-11-08 DIAGNOSIS — A419 Sepsis, unspecified organism: Secondary | ICD-10-CM | POA: Diagnosis present

## 2017-11-08 DIAGNOSIS — G20 Parkinson's disease: Secondary | ICD-10-CM | POA: Diagnosis present

## 2017-11-08 DIAGNOSIS — Z90710 Acquired absence of both cervix and uterus: Secondary | ICD-10-CM | POA: Diagnosis not present

## 2017-11-08 DIAGNOSIS — D638 Anemia in other chronic diseases classified elsewhere: Secondary | ICD-10-CM | POA: Diagnosis present

## 2017-11-08 DIAGNOSIS — Z9660 Presence of unspecified orthopedic joint implant: Secondary | ICD-10-CM | POA: Diagnosis present

## 2017-11-08 DIAGNOSIS — Z66 Do not resuscitate: Secondary | ICD-10-CM | POA: Diagnosis present

## 2017-11-08 DIAGNOSIS — L89153 Pressure ulcer of sacral region, stage 3: Secondary | ICD-10-CM | POA: Diagnosis present

## 2017-11-08 DIAGNOSIS — I10 Essential (primary) hypertension: Secondary | ICD-10-CM | POA: Diagnosis present

## 2017-11-08 DIAGNOSIS — Z888 Allergy status to other drugs, medicaments and biological substances status: Secondary | ICD-10-CM | POA: Diagnosis not present

## 2017-11-08 DIAGNOSIS — E43 Unspecified severe protein-calorie malnutrition: Secondary | ICD-10-CM | POA: Diagnosis present

## 2017-11-08 DIAGNOSIS — F0280 Dementia in other diseases classified elsewhere without behavioral disturbance: Secondary | ICD-10-CM | POA: Diagnosis present

## 2017-11-08 DIAGNOSIS — E538 Deficiency of other specified B group vitamins: Secondary | ICD-10-CM | POA: Diagnosis present

## 2017-11-08 DIAGNOSIS — R112 Nausea with vomiting, unspecified: Secondary | ICD-10-CM

## 2017-11-08 DIAGNOSIS — Z7401 Bed confinement status: Secondary | ICD-10-CM

## 2017-11-08 DIAGNOSIS — Z681 Body mass index (BMI) 19 or less, adult: Secondary | ICD-10-CM

## 2017-11-08 DIAGNOSIS — L8990 Pressure ulcer of unspecified site, unspecified stage: Secondary | ICD-10-CM

## 2017-11-08 LAB — URINALYSIS, COMPLETE (UACMP) WITH MICROSCOPIC
Bacteria, UA: NONE SEEN
Bilirubin Urine: NEGATIVE
Glucose, UA: NEGATIVE mg/dL
Hgb urine dipstick: NEGATIVE
Ketones, ur: 5 mg/dL — AB
Leukocytes, UA: NEGATIVE
Nitrite: NEGATIVE
Protein, ur: NEGATIVE mg/dL
Specific Gravity, Urine: 1.016 (ref 1.005–1.030)
pH: 6 (ref 5.0–8.0)

## 2017-11-08 LAB — PROTIME-INR
INR: 1.15
Prothrombin Time: 14.6 seconds (ref 11.4–15.2)

## 2017-11-08 LAB — CBC WITH DIFFERENTIAL/PLATELET
Basophils Absolute: 0.1 10*3/uL (ref 0–0.1)
Basophils Relative: 0 %
Eosinophils Absolute: 0 10*3/uL (ref 0–0.7)
Eosinophils Relative: 0 %
HCT: 33.7 % — ABNORMAL LOW (ref 35.0–47.0)
Hemoglobin: 11.2 g/dL — ABNORMAL LOW (ref 12.0–16.0)
Lymphocytes Relative: 3 %
Lymphs Abs: 0.5 10*3/uL — ABNORMAL LOW (ref 1.0–3.6)
MCH: 31.5 pg (ref 26.0–34.0)
MCHC: 33.3 g/dL (ref 32.0–36.0)
MCV: 94.5 fL (ref 80.0–100.0)
Monocytes Absolute: 1.1 10*3/uL — ABNORMAL HIGH (ref 0.2–0.9)
Monocytes Relative: 7 %
Neutro Abs: 13.5 10*3/uL — ABNORMAL HIGH (ref 1.4–6.5)
Neutrophils Relative %: 90 %
Platelets: 249 10*3/uL (ref 150–440)
RBC: 3.56 MIL/uL — ABNORMAL LOW (ref 3.80–5.20)
RDW: 13.7 % (ref 11.5–14.5)
WBC: 15.1 10*3/uL — ABNORMAL HIGH (ref 3.6–11.0)

## 2017-11-08 LAB — COMPREHENSIVE METABOLIC PANEL
ALT: 11 U/L — ABNORMAL LOW (ref 14–54)
AST: 27 U/L (ref 15–41)
Albumin: 3.4 g/dL — ABNORMAL LOW (ref 3.5–5.0)
Alkaline Phosphatase: 66 U/L (ref 38–126)
Anion gap: 10 (ref 5–15)
BUN: 19 mg/dL (ref 6–20)
CO2: 23 mmol/L (ref 22–32)
Calcium: 9.3 mg/dL (ref 8.9–10.3)
Chloride: 105 mmol/L (ref 101–111)
Creatinine, Ser: 0.4 mg/dL — ABNORMAL LOW (ref 0.44–1.00)
GFR calc Af Amer: >60 mL/min (ref >60)
GFR calc non Af Amer: >60 mL/min (ref >60)
Glucose, Bld: 154 mg/dL — ABNORMAL HIGH (ref 65–99)
Potassium: 3.7 mmol/L (ref 3.5–5.1)
Sodium: 138 mmol/L (ref 135–145)
Total Bilirubin: 0.9 mg/dL (ref 0.3–1.2)
Total Protein: 6.4 g/dL — ABNORMAL LOW (ref 6.5–8.1)

## 2017-11-08 LAB — INFLUENZA PANEL BY PCR (TYPE A & B)
Influenza A By PCR: NEGATIVE
Influenza B By PCR: NEGATIVE

## 2017-11-08 LAB — PROCALCITONIN: Procalcitonin: 0.52 ng/mL

## 2017-11-08 LAB — APTT: aPTT: 30 seconds (ref 24–36)

## 2017-11-08 LAB — LACTIC ACID, PLASMA
Lactic Acid, Venous: 1.5 mmol/L (ref 0.5–1.9)
Lactic Acid, Venous: 1.2 mmol/L (ref 0.5–1.9)

## 2017-11-08 LAB — TROPONIN I: Troponin I: <0.03 ng/mL (ref <0.03)

## 2017-11-08 MED ORDER — ACETAMINOPHEN 650 MG RE SUPP: 650.0000 mg | Freq: Four times a day (QID) | RECTAL | Status: DC | PRN

## 2017-11-08 MED ORDER — PIPERACILLIN-TAZOBACTAM 3.375 G IVPB
3.3750 g | Freq: Three times a day (TID) | INTRAVENOUS | Status: DC
  Administered 2017-11-08 – 2017-11-11 (×9): 3.375 g via INTRAVENOUS
  Filled 2017-11-08 (×10): qty 50

## 2017-11-08 MED ORDER — BISACODYL 5 MG PO TBEC: 5.0000 mg | DELAYED_RELEASE_TABLET | Freq: Every day | ORAL | Status: DC | PRN

## 2017-11-08 MED ORDER — DOCUSATE SODIUM 100 MG PO CAPS
100.0000 mg | ORAL_CAPSULE | Freq: Two times a day (BID) | ORAL | Status: DC
  Administered 2017-11-10 – 2017-11-11 (×2): 100 mg via ORAL
  Filled 2017-11-08 (×3): qty 1

## 2017-11-08 MED ORDER — ONDANSETRON HCL 4 MG PO TABS
4.0000 mg | ORAL_TABLET | Freq: Four times a day (QID) | ORAL | Status: DC | PRN
Start: 2017-11-08 — End: 2017-11-11

## 2017-11-08 MED ORDER — HYDROCODONE-ACETAMINOPHEN 5-325 MG PO TABS: 1.0000 | ORAL_TABLET | ORAL | Status: DC | PRN

## 2017-11-08 MED ORDER — VANCOMYCIN HCL 500 MG IV SOLR
500.0000 mg | INTRAVENOUS | Status: DC
  Administered 2017-11-09: 500 mg via INTRAVENOUS
  Filled 2017-11-08: qty 500

## 2017-11-08 MED ORDER — CARBIDOPA-LEVODOPA ER 25-100 MG PO TBCR
1.0000 | EXTENDED_RELEASE_TABLET | Freq: Two times a day (BID) | ORAL | Status: DC
  Administered 2017-11-08 – 2017-11-11 (×6): 1 via ORAL
  Filled 2017-11-08 (×7): qty 1

## 2017-11-08 MED ORDER — VANCOMYCIN HCL 500 MG IV SOLR
500.0000 mg | Freq: Once | INTRAVENOUS | Status: AC
  Administered 2017-11-08: 500 mg via INTRAVENOUS
  Filled 2017-11-08: qty 500

## 2017-11-08 MED ORDER — SENNOSIDES-DOCUSATE SODIUM 8.6-50 MG PO TABS
1.0000 | ORAL_TABLET | Freq: Every evening | ORAL | Status: DC | PRN
Start: 2017-11-08 — End: 2017-11-11

## 2017-11-08 MED ORDER — SODIUM CHLORIDE 0.9 % IV BOLUS (SEPSIS)
1000.0000 mL | Freq: Once | INTRAVENOUS | Status: AC
  Administered 2017-11-08: 1000 mL via INTRAVENOUS

## 2017-11-08 MED ORDER — ONDANSETRON HCL 4 MG/2ML IJ SOLN: 4.0000 mg | Freq: Four times a day (QID) | INTRAMUSCULAR | Status: DC | PRN

## 2017-11-08 MED ORDER — METOPROLOL SUCCINATE ER 25 MG PO TB24
25.0000 mg | ORAL_TABLET | Freq: Every day | ORAL | Status: DC
  Administered 2017-11-09 – 2017-11-11 (×2): 25 mg via ORAL
  Filled 2017-11-08 (×3): qty 1

## 2017-11-08 MED ORDER — ACETAMINOPHEN 325 MG PO TABS
650.0000 mg | ORAL_TABLET | Freq: Four times a day (QID) | ORAL | Status: DC | PRN
  Administered 2017-11-08 – 2017-11-09 (×2): 650 mg via ORAL
  Filled 2017-11-08 (×2): qty 2

## 2017-11-08 MED ORDER — SODIUM CHLORIDE 0.9 % IV SOLN
INTRAVENOUS | Status: DC
  Administered 2017-11-08 – 2017-11-11 (×5): via INTRAVENOUS

## 2017-11-08 MED ORDER — PIPERACILLIN-TAZOBACTAM 3.375 G IVPB 30 MIN
3.3750 g | Freq: Once | INTRAVENOUS | Status: AC
Start: 2017-11-08 — End: 2017-11-08
  Administered 2017-11-08: 3.375 g via INTRAVENOUS
  Filled 2017-11-08: qty 50

## 2017-11-08 MED ORDER — IOPAMIDOL (ISOVUE-300) INJECTION 61%
60.0000 mL | Freq: Once | INTRAVENOUS | Status: AC | PRN
Start: 2017-11-08 — End: 2017-11-08
  Administered 2017-11-08: 60 mL via INTRAVENOUS

## 2017-11-08 MED ORDER — ENOXAPARIN SODIUM 30 MG/0.3ML ~~LOC~~ SOLN
30.0000 mg | SUBCUTANEOUS | Status: DC
  Administered 2017-11-09 – 2017-11-10 (×2): 30 mg via SUBCUTANEOUS
  Filled 2017-11-08 (×2): qty 0.3

## 2017-11-08 MED ORDER — ALBUTEROL SULFATE (2.5 MG/3ML) 0.083% IN NEBU: 2.5000 mg | INHALATION_SOLUTION | RESPIRATORY_TRACT | Status: DC | PRN

## 2017-11-08 NOTE — Progress Notes (Signed)
PHARMACIST - PHYSICIAN COMMUNICATION  CONCERNING:  Enoxaparin (Lovenox) for DVT Prophylaxis    RECOMMENDATION: Patient was prescribed enoxaprin 40mg  q24 hours for VTE prophylaxis.   Filed Weights   11/08/17 1625  Weight: 85 lb (38.6 kg)    Body mass index is 16.6 kg/m.  Estimated Creatinine Clearance: 27.3 mL/min (A) (by C-G formula based on SCr of 0.4 mg/dL (L)).  Patient is candidate for enoxaparin 30mg  every 24 hours based on CrCl <3830ml/min  DESCRIPTION: Pharmacy has adjusted enoxaparin dose.  Patient is now receiving enoxaparin 30mg  every 24 hours.  Cher NakaiSheema Lynnex Fulp, PharmD,BCPS Clinical Pharmacist  11/08/2017 9:35 PM

## 2017-11-08 NOTE — Progress Notes (Signed)
CODE SEPSIS - PHARMACY COMMUNICATION  **Broad Spectrum Antibiotics should be administered within 1 hour of Sepsis diagnosis**  Time Code Sepsis Called/Page Received: 2/10 @ 19: 27  Antibiotics Ordered: Zosyn 3.375                                      Vancomycin 500mg    Time of 1st antibiotic administration: 1655 (Zosyn )   Additional action taken by pharmacy: N/A  If necessary, Name of Provider/Nurse Contacted: NA  Gardner CandleSheema M Desaray Marschner, PharmD, BCPS Clinical Pharmacist 11/08/2017 7:31 PM

## 2017-11-08 NOTE — Consult Note (Signed)
Pharmacy Antibiotic Note  [name] is a 84 y.o. female admitted on 11/08/2017 with sepsis.  Pharmacy has been consulted for Zosyn and Vancomycin dosing. Patient received vancomycin 500mg  IV x 1 dose and Zosyn 3.375 IV x 1 dose in ED.   Plan: Ke: 0.029   VD: 26.6   T1/2: 23.9  Start Vancomycin 500mg  IV every 24 hours with  9 hour stack dosing. Calculated trough at Css is 16.4. Trough level ordered prior to 4th dose. Will monitor renal function and adjust dose as needed.   Start Zosyn 3.375 IV EI every 8 hours.   Height: 5' (152.4 cm) Weight: 85 lb (38.6 kg) IBW/kg (Calculated) : 45.5  Temp (24hrs), Avg:103.6 F (39.8 C), Min:103.6 F (39.8 C), Max:103.6 F (39.8 C)  Recent Labs  Lab 11/08/17 1602 11/08/17 1603  WBC 15.1*  --   CREATININE 0.40*  --   LATICACIDVEN  --  1.5    Estimated Creatinine Clearance: 27.3 mL/min (A) (by C-G formula based on SCr of 0.4 mg/dL (L)).    Allergies  Allergen Reactions  . Phenergan [Promethazine Hcl] Other (See Comments)    Family states the patient almost went into a coma.  . Sulfa Antibiotics Other (See Comments)    Unknown reaction  . Sulfasalazine Other (See Comments)    Unknown reaction    Antimicrobials this admission: 2/10 Zosyn >>  2/10 Vancomycin >>   Dose adjustments this admission:  Microbiology results: 2/10  BCx: pending  2/10  UCx: pending   Thank you for allowing pharmacy to be a part of this patient's care.  Gardner CandleSheema M Samiel Peel, PharmD, BCPS Clinical Pharmacist 11/08/2017 6:57 PM

## 2017-11-08 NOTE — ED Notes (Signed)
Pt has stage 3 ulcer on sacrum; pt has soaked dressing removed and cleaned and covers with normal saline gauze and new sacrum sealed dressing applied.

## 2017-11-08 NOTE — ED Triage Notes (Signed)
Pt arrived via EMS from home d/t vomiting and decrease mental status noticed by care takers. EMS reports pt is nonverbal and contracted at baseline. EMS reports pt has a fractured left arm and multiple ulcers on her legs and back.

## 2017-11-08 NOTE — H&P (Signed)
[facility] [place] [facility]   PATIENT NAME: [name]    MR#:  [id number]  DATE OF BIRTH:  April 15, 1923  DATE OF ADMISSION:  11/08/2017  PRIMARY CARE PHYSICIAN: Steele Sizer, MD   REQUESTING/REFERRING PHYSICIAN: Dr. Juliette Alcide  CHIEF COMPLAINT:   Chief Complaint  Patient presents with  . Emesis   Fever and vomiting today. HISTORY OF PRESENT ILLNESS:  [name]  is a 84 y.o. female with a known history of medical problems as below.  The patient was sent from home to ED due to above chief complaints.  The patient has dementia and bedbound.  She has a history of bedsores on her sacral and medial right leg.  She was found fever 104 and vomiting once at home.  She has tachycardia tachypnea and leukocytosis in the ED.  Sepsis protocol is started by Dr. Juliette Alcide.  Chest x-ray did not show any infiltrate and the urinalysis did not show any UTI.  But the CAT scan of the abdomen show constipation.  She had had a bowel movement in the ED.  PAST MEDICAL HISTORY:   Past Medical History:  Diagnosis Date  . Anemia   . Arm fracture 03/2016   left proximal humerus  . Arthritis   . B12 deficiency   . Hypertension   . Mild dementia   . Parkinson disease (HCC)   . Tremor     PAST SURGICAL HISTORY:   Past Surgical History:  Procedure Laterality Date  . ABDOMINAL HYSTERECTOMY    . JOINT REPLACEMENT      SOCIAL HISTORY:   Social History   Tobacco Use  . Smoking status: Never Smoker  . Smokeless tobacco: Never Used  Substance Use Topics  . Alcohol use: No    Alcohol/week: 0.0 oz    FAMILY HISTORY:   Family History  Family history unknown: Yes  Father had cancer.  Both parents deceased.  DRUG ALLERGIES:   Allergies  Allergen Reactions  . Phenergan [Promethazine Hcl] Other (See Comments)    Family states the patient almost went into a coma.  . Sulfa Antibiotics Other (See Comments)    Unknown reaction  . Sulfasalazine Other (See Comments)     Unknown reaction    REVIEW OF SYSTEMS:   Review of Systems  Unable to perform ROS: Dementia    MEDICATIONS AT HOME:   Prior to Admission medications   Medication Sig Start Date End Date Taking? Authorizing Provider  acetaminophen (TYLENOL) 325 MG tablet Take 650 mg by mouth 2 (two) times daily as needed.    Yes [provider]  Calcium Carbonate (CALCIUM-CARB 600 PO) Take 600 mg by mouth 2 (two) times daily.    Yes [provider]  Carbidopa-Levodopa ER (SINEMET CR) 25-100 MG tablet controlled release Take 1 tablet by mouth 2 (two) times daily. 12/21/15  Yes Katha Hamming, MD  Cholecalciferol (D3-1000 PO) Take 2 capsules by mouth daily.   Yes [provider]  docusate sodium (COLACE) 100 MG capsule Take 100 mg by mouth 2 (two) times daily.   Yes [provider]  metoprolol succinate (TOPROL-XL) 50 MG 24 hr tablet TAKE 1 TABLET BY MOUTH EVERY DAY Patient taking differently: TAKE  TABLET (25MG ) BY MOUTH DAILY 10/05/17  Yes Crissman, Redge Gainer, MD  Multiple Vitamins-Minerals (MULTIVITAMIN ADULT PO) Take 1 tablet by mouth daily.    Yes [provider]  chlorhexidine (HIBICLENS) 4 % external liquid Apply topically daily as needed. Patient not  taking: Reported on 08/21/2017 12/18/16   Steele Sizer, MD  collagenase ([name]) ointment Apply 1 application topically daily. Patient not taking: Reported on 08/21/2017 12/25/16   Particia Nearing, PA-C  doxycycline (VIBRAMYCIN) 100 MG capsule Take 1 capsule (100 mg total) by mouth 2 (two) times daily. Patient not taking: Reported on 11/08/2017 08/21/17   Particia Nearing, PA-C  fluconazole (DIFLUCAN) 150 MG tablet Take 1 tablet (150 mg total) by mouth daily. Patient not taking: Reported on 11/08/2017 08/21/17   Particia Nearing, PA-C  nitrofurantoin, macrocrystal-monohydrate, (MACROBID) 100 MG capsule Take 1 capsule (100 mg total) by mouth 2 (two) times daily. Patient not taking:  Reported on 08/21/2017 02/09/17   Olevia Perches P, DO  nystatin (MYCOSTATIN) 100000 UNIT/ML suspension Take 5 mLs (500,000 Units total) by mouth 4 (four) times daily. Patient not taking: Reported on 11/08/2017 09/01/17   Steele Sizer, MD  terbinafine (LAMISIL) 250 MG tablet Take 1 tablet (250 mg total) by mouth daily. Patient not taking: Reported on 11/08/2017 09/08/17   Steele Sizer, MD      VITAL SIGNS:  Blood pressure 117/66, pulse 91, temperature (!) 103.6 F (39.8 C), temperature source Rectal, resp. rate (!) 24, height 5' (1.524 m), weight 85 lb (38.6 kg), SpO2 98 %.  PHYSICAL EXAMINATION:  Physical Exam  GENERAL:  84 y.o.-year-old patient lying in the bed with no acute distress.  Severe malnutrition. EYES: Pupils equal, round, reactive to light and accommodation. No scleral icterus. Extraocular muscles intact.  HEENT: Head atraumatic, normocephalic. Oropharynx and nasopharynx clear.  NECK:  Supple, no jugular venous distention. No thyroid enlargement, no tenderness.  LUNGS: Normal breath sounds bilaterally, no wheezing, rales,rhonchi or crepitation. No use of accessory muscles of respiration.  CARDIOVASCULAR: S1, S2 tachycardia. No murmurs, rubs, or gallops.  ABDOMEN: Soft, nontender, nondistended. Bowel sounds present. No organomegaly or mass.  EXTREMITIES: No cyanosis, or clubbing.  No leg edema.  Both arms, hands and legs contracted.  Multiple healing ulcers. NEUROLOGIC: Unable to exam. PSYCHIATRIC: The patient is demented and nonverbal. SKIN: Multiple healing ulcers.   LABORATORY PANEL:   CBC Recent Labs  Lab 11/08/17 1602  WBC 15.1*  HGB 11.2*  HCT 33.7*  PLT 249   ------------------------------------------------------------------------------------------------------------------  Chemistries  Recent Labs  Lab 11/08/17 1602  NA 138  K 3.7  CL 105  CO2 23  GLUCOSE 154*  BUN 19  CREATININE 0.40*  CALCIUM 9.3  AST 27  ALT 11*  ALKPHOS 66  BILITOT 0.9    ------------------------------------------------------------------------------------------------------------------  Cardiac Enzymes Recent Labs  Lab 11/08/17 1602  TROPONINI <0.03   ------------------------------------------------------------------------------------------------------------------  RADIOLOGY:  Ct Chest W Contrast  Result Date: 11/08/2017 CLINICAL DATA:  Pt arrived via EMS from home d/t vomiting and decrease mental status noticed by care takers. EMS reports pt is nonverbal and contracted at baseline. EMS reports pt has a fractured left arm and multiple ulcers on her legs and back. EXAM: CT CHEST, ABDOMEN, AND PELVIS WITH CONTRAST TECHNIQUE: Multidetector CT imaging of the chest, abdomen and pelvis was performed following the standard protocol during bolus administration of intravenous contrast. CONTRAST:  60mL ISOVUE-300 IOPAMIDOL (ISOVUE-300) INJECTION 61% COMPARISON:  Chest CT, 12/16/2015 FINDINGS: CT CHEST FINDINGS Cardiovascular: Heart is mildly enlarged. Small amount of pericardial fluid. Three-vessel coronary artery calcifications. Aorta is normal in caliber. No dissection. There is mild atherosclerosis along the arch and descending thoracic aorta. Main pulmonary artery is mildly dilated to 3.1 cm. Mild dilation of the right pulmonary artery,  2.9 cm. Prominent left pulmonary artery, 2.7 cm. Mediastinum/Nodes: Thyroid normal in size. No neck base or axillary masses or enlarged lymph nodes. No mediastinal or hilar masses or adenopathy. Trachea is patent. Esophagus is unremarkable. Lungs/Pleura: Reticular and mild hazy intervening opacities are noted in the mid to lower right upper lobe and right lower lobe, and to a lesser degree along the posterior right middle lobe. These opacities were present to a similar degree on the prior chest CT. This may reflect atelectasis/scarring. Infection is possible but less likely. Left lung is clear. No evidence of pulmonary edema. No pleural  effusion or pneumothorax. CT ABDOMEN PELVIS FINDINGS Hepatobiliary: No focal liver abnormality is seen. No gallstones, gallbladder wall thickening, or biliary dilatation. Pancreas: Unremarkable. No pancreatic ductal dilatation or surrounding inflammatory changes. Spleen: Spleen is borderline enlarged measuring 12.2 cm. There is a 16 mm low-density lesion in the spleen that is decreased in size from prior CT where it measured 3.7 cm. No other splenic masses or lesions. Adrenals/Urinary Tract: No adrenal masses. Left greater than right renal cortical thinning with more focal areas of scarring in the left mid to upper pole. Two tiny nonobstructing stones in the left kidney at the midpole. No renal masses. No hydronephrosis. Ureters are difficult to follow, but are nondilated with no convincing stones. Bladder is unremarkable. Stomach/Bowel: Rectum is markedly distended with stool. Significant increased stool extends into the lower sigmoid colon which is dilated 9 cm. Stomach is unremarkable. No small bowel dilation or wall thickening. No convincing colonic wall thickening or inflammation. Evaluation of bowel is limited by the minimal peritoneal fat and lack of contrast. No appendix visualized. Vascular/Lymphatic: 19 mm enlarged right inguinal lymph node. No other adenopathy. Atherosclerotic changes are noted throughout a normal caliber abdominal aorta. Reproductive: Uterus not visualize, it appears to be surgically absent. No adnexal masses. Other: The abdominal wall bulges to the right, but the fascia appears intact. No convincing hernia. No ascites. MUSCULOSKELETAL FINDINGS Chronic displaced fracture of the left proximal humerus. No convincing acute fracture. No osteoblastic or osteolytic lesions. IMPRESSION: 1. Right lung shows mild reticular intervening hazy type opacities, with associated volume loss, similar to the prior CT. These findings are most likely chronic due to chronic atelectasis/scarring. Infection is  possible but less likely. 2. Mild cardiomegaly.  Small pericardial effusion. 3. Mild enlargement of the main and right pulmonary arteries and prominence of the left pulmonary artery. Consider pulmonary hypertension. 4. Aortic atherosclerosis. Marked distention of the rectum with stool with significant dilation of the sigmoid colon with stool. Findings are consistent with a rectal impaction. No evidence of bowel obstruction and no convincing bowel inflammation. 5. Single apparent enlarged right inguinal lymph node measuring 19 mm in short axis. 6. Cystic lesion in the spleen significantly decreased in size from the prior CT. 7. Aortic atherosclerosis. Electronically Signed   By: Amie Portlandavid  Ormond M.D.   On: 11/08/2017 17:54   Ct Abdomen Pelvis W Contrast  Result Date: 11/08/2017 CLINICAL DATA:  Pt arrived via EMS from home d/t vomiting and decrease mental status noticed by care takers. EMS reports pt is nonverbal and contracted at baseline. EMS reports pt has a fractured left arm and multiple ulcers on her legs and back. EXAM: CT CHEST, ABDOMEN, AND PELVIS WITH CONTRAST TECHNIQUE: Multidetector CT imaging of the chest, abdomen and pelvis was performed following the standard protocol during bolus administration of intravenous contrast. CONTRAST:  60mL ISOVUE-300 IOPAMIDOL (ISOVUE-300) INJECTION 61% COMPARISON:  Chest CT, 12/16/2015 FINDINGS: CT CHEST  FINDINGS Cardiovascular: Heart is mildly enlarged. Small amount of pericardial fluid. Three-vessel coronary artery calcifications. Aorta is normal in caliber. No dissection. There is mild atherosclerosis along the arch and descending thoracic aorta. Main pulmonary artery is mildly dilated to 3.1 cm. Mild dilation of the right pulmonary artery, 2.9 cm. Prominent left pulmonary artery, 2.7 cm. Mediastinum/Nodes: Thyroid normal in size. No neck base or axillary masses or enlarged lymph nodes. No mediastinal or hilar masses or adenopathy. Trachea is patent. Esophagus is  unremarkable. Lungs/Pleura: Reticular and mild hazy intervening opacities are noted in the mid to lower right upper lobe and right lower lobe, and to a lesser degree along the posterior right middle lobe. These opacities were present to a similar degree on the prior chest CT. This may reflect atelectasis/scarring. Infection is possible but less likely. Left lung is clear. No evidence of pulmonary edema. No pleural effusion or pneumothorax. CT ABDOMEN PELVIS FINDINGS Hepatobiliary: No focal liver abnormality is seen. No gallstones, gallbladder wall thickening, or biliary dilatation. Pancreas: Unremarkable. No pancreatic ductal dilatation or surrounding inflammatory changes. Spleen: Spleen is borderline enlarged measuring 12.2 cm. There is a 16 mm low-density lesion in the spleen that is decreased in size from prior CT where it measured 3.7 cm. No other splenic masses or lesions. Adrenals/Urinary Tract: No adrenal masses. Left greater than right renal cortical thinning with more focal areas of scarring in the left mid to upper pole. Two tiny nonobstructing stones in the left kidney at the midpole. No renal masses. No hydronephrosis. Ureters are difficult to follow, but are nondilated with no convincing stones. Bladder is unremarkable. Stomach/Bowel: Rectum is markedly distended with stool. Significant increased stool extends into the lower sigmoid colon which is dilated 9 cm. Stomach is unremarkable. No small bowel dilation or wall thickening. No convincing colonic wall thickening or inflammation. Evaluation of bowel is limited by the minimal peritoneal fat and lack of contrast. No appendix visualized. Vascular/Lymphatic: 19 mm enlarged right inguinal lymph node. No other adenopathy. Atherosclerotic changes are noted throughout a normal caliber abdominal aorta. Reproductive: Uterus not visualize, it appears to be surgically absent. No adnexal masses. Other: The abdominal wall bulges to the right, but the fascia  appears intact. No convincing hernia. No ascites. MUSCULOSKELETAL FINDINGS Chronic displaced fracture of the left proximal humerus. No convincing acute fracture. No osteoblastic or osteolytic lesions. IMPRESSION: 1. Right lung shows mild reticular intervening hazy type opacities, with associated volume loss, similar to the prior CT. These findings are most likely chronic due to chronic atelectasis/scarring. Infection is possible but less likely. 2. Mild cardiomegaly.  Small pericardial effusion. 3. Mild enlargement of the main and right pulmonary arteries and prominence of the left pulmonary artery. Consider pulmonary hypertension. 4. Aortic atherosclerosis. Marked distention of the rectum with stool with significant dilation of the sigmoid colon with stool. Findings are consistent with a rectal impaction. No evidence of bowel obstruction and no convincing bowel inflammation. 5. Single apparent enlarged right inguinal lymph node measuring 19 mm in short axis. 6. Cystic lesion in the spleen significantly decreased in size from the prior CT. 7. Aortic atherosclerosis. Electronically Signed   By: Amie Portland M.D.   On: 11/08/2017 17:54   Dg Chest Portable 1 View  Result Date: 11/08/2017 CLINICAL DATA:  Pt arrived via EMS from home d/t vomiting and decrease mental status noticed by care takers. EMS reports pt is nonverbal and contracted at baseline. EMS reports pt has a fractured left arm and multiple ulcers on her  legs and back. Hx - HTN, non-smoker. EXAM: PORTABLE CHEST 1 VIEW COMPARISON:  10/13/2016 FINDINGS: Cardiac silhouette is mildly enlarged. No mediastinal or hilar masses. There are prominent bronchovascular markings. Lungs are otherwise clear. No convincing pleural effusion. No pneumothorax. Bony thorax is demineralized. There is a chronic displaced fracture of the proximal left humerus stable from the prior study. IMPRESSION: No acute cardiopulmonary disease. Electronically Signed   By: Amie Portland  M.D.   On: 11/08/2017 16:39      IMPRESSION AND PLAN:   Sepsis, and no source. The patient will be admitted to medical floor. Continue vancomycin and Zosyn, follow-up CBC and cultures. IV fluids support.  Hypertension.  Continue Toprol.  Hold if blood pressure is low. Dementia.  Continue home dementia medications, aspiration the fall precaution. Anemia of chronic disease.  Stable. Severe malnutrition.  Dietitian consult.  All the records are reviewed and case discussed with ED provider. Management plans discussed with the patient's daughter [facility]) and they are in agreement.  CODE STATUS: DNR.  TOTAL TIME TAKING CARE OF THIS PATIENT: 48 minutes.    Shaune Pollack M.D on 11/08/2017 at 6:36 PM  Between 7am to 6pm - Pager - [phone]  After 6pm go to [email or web address] - Social research officer, government  [facility]  Office  [phone]  CC: Primary care physician; Steele Sizer, MD   Note: This dictation was prepared with Dragon dictation along with smaller phrase technology. Any transcriptional errors that result from this process are unin

## 2017-11-08 NOTE — ED Provider Notes (Addendum)
[facility] Emergency Department Provider Note   ____________________________________________   First MD Initiated Contact with Patient 11/08/17 1530     (approximate)  I have reviewed the triage vital signs and the nursing notes.   HISTORY  Chief Complaint Emesis history limited by patient being nonverbal  HPI [name] is a 84 y.o. female Who comes from home with history of vomiting. She seems to be slightly less with it than usual. She has a history of bedsores on her sacrum and medial right leg. She had a fever at home as well. Was reported 104.   Past Medical History:  Diagnosis Date  . Anemia   . Arm fracture 03/2016   left proximal humerus  . Arthritis   . B12 deficiency   . Hypertension   . Mild dementia   . Parkinson disease (HCC)   . Tremor     Patient Active Problem List   Diagnosis Date Noted  . [place] 12/18/2016  . Cloudy urine 07/16/2016  . Perimenopausal atrophic vaginitis 03/03/2016  . Parkinson's disease (HCC) 12/16/2015  . Dysphagia 12/16/2015  . B12 deficiency 10/29/2015  . Traumatic closed displaced fracture of multiple ribs 10/29/2015  . Hypertension     Past Surgical History:  Procedure Laterality Date  . ABDOMINAL HYSTERECTOMY    . JOINT REPLACEMENT      Prior to Admission medications   Medication Sig Start Date End Date Taking? Authorizing Provider  acetaminophen (TYLENOL) 325 MG tablet Take 650 mg by mouth 2 (two) times daily as needed.    Yes [provider]  Calcium Carbonate (CALCIUM-CARB 600 PO) Take 600 mg by mouth 2 (two) times daily.    Yes [provider]  Carbidopa-Levodopa ER (SINEMET CR) 25-100 MG tablet controlled release Take 1 tablet by mouth 2 (two) times daily. 12/21/15  Yes Katha HammingKonidena, Snehalatha, MD  Cholecalciferol (D3-1000 PO) Take 2 capsules by mouth daily.   Yes [provider]  docusate sodium (COLACE) 100 MG capsule Take 100 mg by mouth 2 (two) times  daily.   Yes [provider]  metoprolol succinate (TOPROL-XL) 50 MG 24 hr tablet TAKE 1 TABLET BY MOUTH EVERY DAY Patient taking differently: TAKE  TABLET (25MG ) BY MOUTH DAILY 10/05/17  Yes Crissman, Redge GainerMark A, MD  Multiple Vitamins-Minerals (MULTIVITAMIN ADULT PO) Take 1 tablet by mouth daily.    Yes [provider]  chlorhexidine (HIBICLENS) 4 % external liquid Apply topically daily as needed. Patient not taking: Reported on 08/21/2017 12/18/16   Steele Sizerrissman, Mark A, MD  collagenase ([name]) ointment Apply 1 application topically daily. Patient not taking: Reported on 08/21/2017 12/25/16   Particia NearingLane, Rachel Elizabeth, PA-C  doxycycline (VIBRAMYCIN) 100 MG capsule Take 1 capsule (100 mg total) by mouth 2 (two) times daily. Patient not taking: Reported on 11/08/2017 08/21/17   Particia NearingLane, Rachel Elizabeth, PA-C  fluconazole (DIFLUCAN) 150 MG tablet Take 1 tablet (150 mg total) by mouth daily. Patient not taking: Reported on 11/08/2017 08/21/17   Particia NearingLane, Rachel Elizabeth, PA-C  nitrofurantoin, macrocrystal-monohydrate, (MACROBID) 100 MG capsule Take 1 capsule (100 mg total) by mouth 2 (two) times daily. Patient not taking: Reported on 08/21/2017 02/09/17   Olevia PerchesJohnson, Megan P, DO  nystatin (MYCOSTATIN) 100000 UNIT/ML suspension Take 5 mLs (500,000 Units total) by mouth 4 (four) times daily. Patient not taking: Reported on 11/08/2017 09/01/17   Steele Sizerrissman, Mark A, MD  terbinafine (LAMISIL) 250 MG tablet Take 1 tablet (250 mg total) by mouth daily. Patient not taking: Reported  on 11/08/2017 09/08/17   Steele Sizer, MD    Allergies Phenergan [promethazine hcl]; Sulfa antibiotics; and Sulfasalazine  Family History  Family history unknown: Yes    Social History Social History   Tobacco Use  . Smoking status: Never Smoker  . Smokeless tobacco: Never Used  Substance Use Topics  . Alcohol use: No    Alcohol/week: 0.0 oz  . Drug use: No    Review of Systems and unable to  obtain ____________________________________________   PHYSICAL EXAM:  VITAL SIGNS: ED Triage Vitals [11/08/17 1608]  Enc Vitals Group     BP (!) 143/102     Pulse Rate 100     Resp (!) 21     Temp (!) 103.6 F (39.8 C)     Temp Source Rectal     SpO2 100 %     Weight      Height      Head Circumference      Peak Flow      Pain Score      Pain Loc      Pain Edu?      Excl. in GC?     Constitutional:patient sleepy but arousable. Well appearing and in no acute distress. Eyes: Conjunctivae are normal.  Head: Atraumatic. Nose: No congestion/rhinnorhea. Mouth/Throat: Mucous membranes are moist.  Oropharynx non-erythematous. Neck: No stridor.   Cardiovascular: Normal rate, regular rhythm. Grossly normal heart sounds.  Good peripheral circulation. Respiratory: Normal respiratory effort.  No retractions. Lungs CTAB. Gastrointestinal: Soft and nontender. No distention. No abdominal bruits. No CVA tenderness. Musculoskeletal: No lower extremity tenderness nor edema.  No joint effusions. Neurologic speech and language reported at baseline. No gross focal neurologic deficits are appreciated.  Skin:  decubitus as described in history of present illness  ____________________________________________   LABS (all labs ordered are listed, but only abnormal results are displayed)  Labs Reviewed  COMPREHENSIVE METABOLIC PANEL - Abnormal; Notable for the following components:      Result Value   Glucose, Bld 154 (*)    Creatinine, Ser 0.40 (*)    Total Protein 6.4 (*)    Albumin 3.4 (*)    ALT 11 (*)    All other components within normal limits  CBC WITH DIFFERENTIAL/PLATELET - Abnormal; Notable for the following components:   WBC 15.1 (*)    RBC 3.56 (*)    Hemoglobin 11.2 (*)    HCT 33.7 (*)    Neutro Abs 13.5 (*)    Lymphs Abs 0.5 (*)    Monocytes Absolute 1.1 (*)    All other components within normal limits  URINALYSIS, COMPLETE (UACMP) WITH MICROSCOPIC - Abnormal; Notable  for the following components:   Color, Urine YELLOW (*)    APPearance CLEAR (*)    Ketones, ur 5 (*)    Squamous Epithelial / LPF 0-5 (*)    All other components within normal limits  URINE CULTURE  CULTURE, BLOOD (ROUTINE X 2)  CULTURE, BLOOD (ROUTINE X 2)  TROPONIN I  LACTIC ACID, PLASMA  INFLUENZA PANEL BY PCR (TYPE A & B)  LACTIC ACID, PLASMA  URINALYSIS, ROUTINE W REFLEX MICROSCOPIC  I-STAT CG4 LACTIC ACID, ED  I-STAT CG4 LACTIC ACID, ED   ____________________________________________  EKG   ____________________________________________  RADIOLOGY  ED MD interpretation:  chest x-ray shows no acute pathology just the known humeral fracture. CT of the chest shows possible pneumonia haziness basically unchanged from prior CT. CT of the belly only shows an enlarged inguinal  node on the right and a lot of constipation Official radiology report(s): Ct Chest W Contrast  Result Date: 11/08/2017 CLINICAL DATA:  Pt arrived via EMS from home d/t vomiting and decrease mental status noticed by care takers. EMS reports pt is nonverbal and contracted at baseline. EMS reports pt has a fractured left arm and multiple ulcers on her legs and back. EXAM: CT CHEST, ABDOMEN, AND PELVIS WITH CONTRAST TECHNIQUE: Multidetector CT imaging of the chest, abdomen and pelvis was performed following the standard protocol during bolus administration of intravenous contrast. CONTRAST:  60mL ISOVUE-300 IOPAMIDOL (ISOVUE-300) INJECTION 61% COMPARISON:  Chest CT, 12/16/2015 FINDINGS: CT CHEST FINDINGS Cardiovascular: Heart is mildly enlarged. Small amount of pericardial fluid. Three-vessel coronary artery calcifications. Aorta is normal in caliber. No dissection. There is mild atherosclerosis along the arch and descending thoracic aorta. Main pulmonary artery is mildly dilated to 3.1 cm. Mild dilation of the right pulmonary artery, 2.9 cm. Prominent left pulmonary artery, 2.7 cm. Mediastinum/Nodes: Thyroid normal in  size. No neck base or axillary masses or enlarged lymph nodes. No mediastinal or hilar masses or adenopathy. Trachea is patent. Esophagus is unremarkable. Lungs/Pleura: Reticular and mild hazy intervening opacities are noted in the mid to lower right upper lobe and right lower lobe, and to a lesser degree along the posterior right middle lobe. These opacities were present to a similar degree on the prior chest CT. This may reflect atelectasis/scarring. Infection is possible but less likely. Left lung is clear. No evidence of pulmonary edema. No pleural effusion or pneumothorax. CT ABDOMEN PELVIS FINDINGS Hepatobiliary: No focal liver abnormality is seen. No gallstones, gallbladder wall thickening, or biliary dilatation. Pancreas: Unremarkable. No pancreatic ductal dilatation or surrounding inflammatory changes. Spleen: Spleen is borderline enlarged measuring 12.2 cm. There is a 16 mm low-density lesion in the spleen that is decreased in size from prior CT where it measured 3.7 cm. No other splenic masses or lesions. Adrenals/Urinary Tract: No adrenal masses. Left greater than right renal cortical thinning with more focal areas of scarring in the left mid to upper pole. Two tiny nonobstructing stones in the left kidney at the midpole. No renal masses. No hydronephrosis. Ureters are difficult to follow, but are nondilated with no convincing stones. Bladder is unremarkable. Stomach/Bowel: Rectum is markedly distended with stool. Significant increased stool extends into the lower sigmoid colon which is dilated 9 cm. Stomach is unremarkable. No small bowel dilation or wall thickening. No convincing colonic wall thickening or inflammation. Evaluation of bowel is limited by the minimal peritoneal fat and lack of contrast. No appendix visualized. Vascular/Lymphatic: 19 mm enlarged right inguinal lymph node. No other adenopathy. Atherosclerotic changes are noted throughout a normal caliber abdominal aorta. Reproductive:  Uterus not visualize, it appears to be surgically absent. No adnexal masses. Other: The abdominal wall bulges to the right, but the fascia appears intact. No convincing hernia. No ascites. MUSCULOSKELETAL FINDINGS Chronic displaced fracture of the left proximal humerus. No convincing acute fracture. No osteoblastic or osteolytic lesions. IMPRESSION: 1. Right lung shows mild reticular intervening hazy type opacities, with associated volume loss, similar to the prior CT. These findings are most likely chronic due to chronic atelectasis/scarring. Infection is possible but less likely. 2. Mild cardiomegaly.  Small pericardial effusion. 3. Mild enlargement of the main and right pulmonary arteries and prominence of the left pulmonary artery. Consider pulmonary hypertension. 4. Aortic atherosclerosis. Marked distention of the rectum with stool with significant dilation of the sigmoid colon with stool. Findings are consistent with  a rectal impaction. No evidence of bowel obstruction and no convincing bowel inflammation. 5. Single apparent enlarged right inguinal lymph node measuring 19 mm in short axis. 6. Cystic lesion in the spleen significantly decreased in size from the prior CT. 7. Aortic atherosclerosis. Electronically Signed   By: Amie Portland M.D.   On: 11/08/2017 17:54   Ct Abdomen Pelvis W Contrast  Result Date: 11/08/2017 CLINICAL DATA:  Pt arrived via EMS from home d/t vomiting and decrease mental status noticed by care takers. EMS reports pt is nonverbal and contracted at baseline. EMS reports pt has a fractured left arm and multiple ulcers on her legs and back. EXAM: CT CHEST, ABDOMEN, AND PELVIS WITH CONTRAST TECHNIQUE: Multidetector CT imaging of the chest, abdomen and pelvis was performed following the standard protocol during bolus administration of intravenous contrast. CONTRAST:  60mL ISOVUE-300 IOPAMIDOL (ISOVUE-300) INJECTION 61% COMPARISON:  Chest CT, 12/16/2015 FINDINGS: CT CHEST FINDINGS  Cardiovascular: Heart is mildly enlarged. Small amount of pericardial fluid. Three-vessel coronary artery calcifications. Aorta is normal in caliber. No dissection. There is mild atherosclerosis along the arch and descending thoracic aorta. Main pulmonary artery is mildly dilated to 3.1 cm. Mild dilation of the right pulmonary artery, 2.9 cm. Prominent left pulmonary artery, 2.7 cm. Mediastinum/Nodes: Thyroid normal in size. No neck base or axillary masses or enlarged lymph nodes. No mediastinal or hilar masses or adenopathy. Trachea is patent. Esophagus is unremarkable. Lungs/Pleura: Reticular and mild hazy intervening opacities are noted in the mid to lower right upper lobe and right lower lobe, and to a lesser degree along the posterior right middle lobe. These opacities were present to a similar degree on the prior chest CT. This may reflect atelectasis/scarring. Infection is possible but less likely. Left lung is clear. No evidence of pulmonary edema. No pleural effusion or pneumothorax. CT ABDOMEN PELVIS FINDINGS Hepatobiliary: No focal liver abnormality is seen. No gallstones, gallbladder wall thickening, or biliary dilatation. Pancreas: Unremarkable. No pancreatic ductal dilatation or surrounding inflammatory changes. Spleen: Spleen is borderline enlarged measuring 12.2 cm. There is a 16 mm low-density lesion in the spleen that is decreased in size from prior CT where it measured 3.7 cm. No other splenic masses or lesions. Adrenals/Urinary Tract: No adrenal masses. Left greater than right renal cortical thinning with more focal areas of scarring in the left mid to upper pole. Two tiny nonobstructing stones in the left kidney at the midpole. No renal masses. No hydronephrosis. Ureters are difficult to follow, but are nondilated with no convincing stones. Bladder is unremarkable. Stomach/Bowel: Rectum is markedly distended with stool. Significant increased stool extends into the lower sigmoid colon which is  dilated 9 cm. Stomach is unremarkable. No small bowel dilation or wall thickening. No convincing colonic wall thickening or inflammation. Evaluation of bowel is limited by the minimal peritoneal fat and lack of contrast. No appendix visualized. Vascular/Lymphatic: 19 mm enlarged right inguinal lymph node. No other adenopathy. Atherosclerotic changes are noted throughout a normal caliber abdominal aorta. Reproductive: Uterus not visualize, it appears to be surgically absent. No adnexal masses. Other: The abdominal wall bulges to the right, but the fascia appears intact. No convincing hernia. No ascites. MUSCULOSKELETAL FINDINGS Chronic displaced fracture of the left proximal humerus. No convincing acute fracture. No osteoblastic or osteolytic lesions. IMPRESSION: 1. Right lung shows mild reticular intervening hazy type opacities, with associated volume loss, similar to the prior CT. These findings are most likely chronic due to chronic atelectasis/scarring. Infection is possible but less likely. 2.  Mild cardiomegaly.  Small pericardial effusion. 3. Mild enlargement of the main and right pulmonary arteries and prominence of the left pulmonary artery. Consider pulmonary hypertension. 4. Aortic atherosclerosis. Marked distention of the rectum with stool with significant dilation of the sigmoid colon with stool. Findings are consistent with a rectal impaction. No evidence of bowel obstruction and no convincing bowel inflammation. 5. Single apparent enlarged right inguinal lymph node measuring 19 mm in short axis. 6. Cystic lesion in the spleen significantly decreased in size from the prior CT. 7. Aortic atherosclerosis. Electronically Signed   By: Amie Portland M.D.   On: 11/08/2017 17:54   Dg Chest Portable 1 View  Result Date: 11/08/2017 CLINICAL DATA:  Pt arrived via EMS from home d/t vomiting and decrease mental status noticed by care takers. EMS reports pt is nonverbal and contracted at baseline. EMS reports pt  has a fractured left arm and multiple ulcers on her legs and back. Hx - HTN, non-smoker. EXAM: PORTABLE CHEST 1 VIEW COMPARISON:  10/13/2016 FINDINGS: Cardiac silhouette is mildly enlarged. No mediastinal or hilar masses. There are prominent bronchovascular markings. Lungs are otherwise clear. No convincing pleural effusion. No pneumothorax. Bony thorax is demineralized. There is a chronic displaced fracture of the proximal left humerus stable from the prior study. IMPRESSION: No acute cardiopulmonary disease. Electronically Signed   By: Amie Portland M.D.   On: 11/08/2017 16:39    ____________________________________________   PROCEDURES  Procedure(s) performed:   Procedures  Critical Care performed:   ____________________________________________   INITIAL IMPRESSION / ASSESSMENT AND PLAN / ED COURSE  patient had 2 large stools in the ER when I checked her rectum after the CT scan it was empty. This is a change from a CT scan. Her sacral decubitus does not look infected. I will admit her anyway because she was tachycardic she vomited and she had a high fever. She meets the minimum criteria for sepsis. There is no source that I can find however.   I should add that the patient's neck is somewhat stiff but the family adds that she is always like this.      ____________________________________________   FINAL CLINICAL IMPRESSION(S) / ED DIAGNOSES  Final diagnoses:  Non-intractable vomiting with nausea, unspecified vomiting type  Sepsis, due to unspecified organism [facility])     ED Discharge Orders    None       Note:  This document was prepared using Dragon voice recognition software and may include unintentional dictation errors.    Arnaldo Natal, MD 11/08/17 1610    Arnaldo Natal, MD 11/08/17 [phone]

## 2017-11-09 DIAGNOSIS — L8990 Pressure ulcer of unspecified site, unspecified stage: Secondary | ICD-10-CM

## 2017-11-09 LAB — BLOOD CULTURE ID PANEL (REFLEXED)
Acinetobacter baumannii: NOT DETECTED
Candida albicans: NOT DETECTED
Candida glabrata: NOT DETECTED
Candida krusei: NOT DETECTED
Candida parapsilosis: NOT DETECTED
Candida tropicalis: NOT DETECTED
Carbapenem resistance: NOT DETECTED
Enterobacter cloacae complex: NOT DETECTED
Enterobacteriaceae species: NOT DETECTED
Enterococcus species: NOT DETECTED
Escherichia coli: NOT DETECTED
Haemophilus influenzae: NOT DETECTED
Klebsiella oxytoca: NOT DETECTED
Klebsiella pneumoniae: NOT DETECTED
Listeria monocytogenes: NOT DETECTED
Methicillin resistance: DETECTED — AB
Neisseria meningitidis: NOT DETECTED
Proteus species: NOT DETECTED
Pseudomonas aeruginosa: NOT DETECTED
Serratia marcescens: NOT DETECTED
Staphylococcus aureus (BCID): NOT DETECTED
Staphylococcus species: DETECTED — AB
Streptococcus agalactiae: NOT DETECTED
Streptococcus pneumoniae: NOT DETECTED
Streptococcus pyogenes: NOT DETECTED
Streptococcus species: NOT DETECTED
Vancomycin resistance: NOT DETECTED

## 2017-11-09 LAB — BASIC METABOLIC PANEL
Anion gap: 9 (ref 5–15)
BUN: 20 mg/dL (ref 6–20)
CO2: 23 mmol/L (ref 22–32)
Calcium: 9 mg/dL (ref 8.9–10.3)
Chloride: 107 mmol/L (ref 101–111)
Creatinine, Ser: 0.43 mg/dL — ABNORMAL LOW (ref 0.44–1.00)
GFR calc Af Amer: >60 mL/min (ref >60)
GFR calc non Af Amer: >60 mL/min (ref >60)
Glucose, Bld: 141 mg/dL — ABNORMAL HIGH (ref 65–99)
Potassium: 3.7 mmol/L (ref 3.5–5.1)
Sodium: 139 mmol/L (ref 135–145)

## 2017-11-09 LAB — URINE CULTURE: Culture: NO GROWTH

## 2017-11-09 LAB — CBC
HCT: 29.4 % — ABNORMAL LOW (ref 35.0–47.0)
Hemoglobin: 9.8 g/dL — ABNORMAL LOW (ref 12.0–16.0)
MCH: 31.4 pg (ref 26.0–34.0)
MCHC: 33.3 g/dL (ref 32.0–36.0)
MCV: 94.5 fL (ref 80.0–100.0)
Platelets: 244 10*3/uL (ref 150–440)
RBC: 3.11 MIL/uL — ABNORMAL LOW (ref 3.80–5.20)
RDW: 13.8 % (ref 11.5–14.5)
WBC: 18.1 10*3/uL — ABNORMAL HIGH (ref 3.6–11.0)

## 2017-11-09 MED ORDER — VITAMIN C 500 MG PO TABS
500.0000 mg | ORAL_TABLET | Freq: Two times a day (BID) | ORAL | Status: DC
  Administered 2017-11-10 – 2017-11-11 (×3): 500 mg via ORAL
  Filled 2017-11-09 (×4): qty 1

## 2017-11-09 MED ORDER — VANCOMYCIN HCL 500 MG IV SOLR
500.0000 mg | INTRAVENOUS | Status: DC
  Administered 2017-11-10 – 2017-11-11 (×2): 500 mg via INTRAVENOUS
  Filled 2017-11-09 (×3): qty 500

## 2017-11-09 MED ORDER — ADULT MULTIVITAMIN W/MINERALS CH
1.0000 | ORAL_TABLET | Freq: Every day | ORAL | Status: DC
  Administered 2017-11-10 – 2017-11-11 (×2): 1 via ORAL
  Filled 2017-11-09 (×2): qty 1

## 2017-11-09 NOTE — Progress Notes (Signed)
[employer] [facility] [facility] [place] [facility]   PATIENT NAME: [name]    MR#:  [id number]  DATE OF BIRTH:  08-18-1923  SUBJECTIVE:  CHIEF COMPLAINT:   Chief Complaint  Patient presents with  . Emesis   Came with vomiting and weakness for 1 day. Patient is bedbound since many years and has sacral decubital ulcer. No source of infection was found so far. On broad-spectrum antibiotics. Her daughter is in the room to give me more details.  REVIEW OF SYSTEMS:    Dementia, not able to give much details.  ROS  DRUG ALLERGIES:   Allergies  Allergen Reactions  . Phenergan [Promethazine Hcl] Other (See Comments)    Family states the patient almost went into a coma.  . Sulfa Antibiotics Other (See Comments)    Unknown reaction  . Sulfasalazine Other (See Comments)    Unknown reaction    VITALS:  Blood pressure 121/67, pulse 66, temperature 98.1 F (36.7 C), temperature source Oral, resp. rate 18, height 4\' 9"  (1.448 m), weight 38.6 kg (85 lb), SpO2 93 %.  PHYSICAL EXAMINATION:   GENERAL:  84 y.o.-year-old patient lying in the bed with no acute distress.  Severe malnutrition. EYES: Pupils equal, round, reactive to light and accommodation. No scleral icterus. Extraocular muscles intact.  HEENT: Head atraumatic, normocephalic. Oropharynx and nasopharynx clear.  NECK:  Supple, no jugular venous distention. No thyroid enlargement, no tenderness.  LUNGS: Normal breath sounds bilaterally, no wheezing, rales,rhonchi or crepitation. No use of accessory muscles of respiration.  CARDIOVASCULAR: S1, S2 tachycardia. No murmurs, rubs, or gallops.  ABDOMEN: Soft, nontender, nondistended. Bowel sounds present. No organomegaly or mass.  EXTREMITIES: No cyanosis, or clubbing.  No leg edema.  Both arms, hands and legs contracted.  Multiple healing ulcers. NEUROLOGIC: Unable to exam. PSYCHIATRIC: The patient is demented and nonverbal. SKIN: Multiple healing ulcers. sacral decubetus  ulcer.   Physical Exam LABORATORY PANEL:   CBC Recent Labs  Lab 11/09/17 0347  WBC 18.1*  HGB 9.8*  HCT 29.4*  PLT 244   ------------------------------------------------------------------------------------------------------------------  Chemistries  Recent Labs  Lab 11/08/17 1602 11/09/17 0347  NA 138 139  K 3.7 3.7  CL 105 107  CO2 23 23  GLUCOSE 154* 141*  BUN 19 20  CREATININE 0.40* 0.43*  CALCIUM 9.3 9.0  AST 27  --   ALT 11*  --   ALKPHOS 66  --   BILITOT 0.9  --    ------------------------------------------------------------------------------------------------------------------  Cardiac Enzymes Recent Labs  Lab 11/08/17 1602  TROPONINI <0.03   ------------------------------------------------------------------------------------------------------------------  RADIOLOGY:  Ct Chest W Contrast  Result Date: 11/08/2017 CLINICAL DATA:  Pt arrived via EMS from home d/t vomiting and decrease mental status noticed by care takers. EMS reports pt is nonverbal and contracted at baseline. EMS reports pt has a fractured left arm and multiple ulcers on her legs and back. EXAM: CT CHEST, ABDOMEN, AND PELVIS WITH CONTRAST TECHNIQUE: Multidetector CT imaging of the chest, abdomen and pelvis was performed following the standard protocol during bolus administration of intravenous contrast. CONTRAST:  60mL ISOVUE-300 IOPAMIDOL (ISOVUE-300) INJECTION 61% COMPARISON:  Chest CT, 12/16/2015 FINDINGS: CT CHEST FINDINGS Cardiovascular: Heart is mildly enlarged. Small amount of pericardial fluid. Three-vessel coronary artery calcifications. Aorta is normal in caliber. No dissection. There is mild atherosclerosis along the arch and descending thoracic aorta. Main pulmonary artery is mildly dilated to 3.1 cm. Mild dilation of the right pulmonary artery, 2.9 cm. Prominent left pulmonary artery, 2.7 cm. Mediastinum/Nodes: Thyroid  normal in size. No neck base or axillary masses or enlarged lymph  nodes. No mediastinal or hilar masses or adenopathy. Trachea is patent. Esophagus is unremarkable. Lungs/Pleura: Reticular and mild hazy intervening opacities are noted in the mid to lower right upper lobe and right lower lobe, and to a lesser degree along the posterior right middle lobe. These opacities were present to a similar degree on the prior chest CT. This may reflect atelectasis/scarring. Infection is possible but less likely. Left lung is clear. No evidence of pulmonary edema. No pleural effusion or pneumothorax. CT ABDOMEN PELVIS FINDINGS Hepatobiliary: No focal liver abnormality is seen. No gallstones, gallbladder wall thickening, or biliary dilatation. Pancreas: Unremarkable. No pancreatic ductal dilatation or surrounding inflammatory changes. Spleen: Spleen is borderline enlarged measuring 12.2 cm. There is a 16 mm low-density lesion in the spleen that is decreased in size from prior CT where it measured 3.7 cm. No other splenic masses or lesions. Adrenals/Urinary Tract: No adrenal masses. Left greater than right renal cortical thinning with more focal areas of scarring in the left mid to upper pole. Two tiny nonobstructing stones in the left kidney at the midpole. No renal masses. No hydronephrosis. Ureters are difficult to follow, but are nondilated with no convincing stones. Bladder is unremarkable. Stomach/Bowel: Rectum is markedly distended with stool. Significant increased stool extends into the lower sigmoid colon which is dilated 9 cm. Stomach is unremarkable. No small bowel dilation or wall thickening. No convincing colonic wall thickening or inflammation. Evaluation of bowel is limited by the minimal peritoneal fat and lack of contrast. No appendix visualized. Vascular/Lymphatic: 19 mm enlarged right inguinal lymph node. No other adenopathy. Atherosclerotic changes are noted throughout a normal caliber abdominal aorta. Reproductive: Uterus not visualize, it appears to be surgically absent. No  adnexal masses. Other: The abdominal wall bulges to the right, but the fascia appears intact. No convincing hernia. No ascites. MUSCULOSKELETAL FINDINGS Chronic displaced fracture of the left proximal humerus. No convincing acute fracture. No osteoblastic or osteolytic lesions. IMPRESSION: 1. Right lung shows mild reticular intervening hazy type opacities, with associated volume loss, similar to the prior CT. These findings are most likely chronic due to chronic atelectasis/scarring. Infection is possible but less likely. 2. Mild cardiomegaly.  Small pericardial effusion. 3. Mild enlargement of the main and right pulmonary arteries and prominence of the left pulmonary artery. Consider pulmonary hypertension. 4. Aortic atherosclerosis. Marked distention of the rectum with stool with significant dilation of the sigmoid colon with stool. Findings are consistent with a rectal impaction. No evidence of bowel obstruction and no convincing bowel inflammation. 5. Single apparent enlarged right inguinal lymph node measuring 19 mm in short axis. 6. Cystic lesion in the spleen significantly decreased in size from the prior CT. 7. Aortic atherosclerosis. Electronically Signed   By: Amie Portland M.D.   On: 11/08/2017 17:54   Ct Abdomen Pelvis W Contrast  Result Date: 11/08/2017 CLINICAL DATA:  Pt arrived via EMS from home d/t vomiting and decrease mental status noticed by care takers. EMS reports pt is nonverbal and contracted at baseline. EMS reports pt has a fractured left arm and multiple ulcers on her legs and back. EXAM: CT CHEST, ABDOMEN, AND PELVIS WITH CONTRAST TECHNIQUE: Multidetector CT imaging of the chest, abdomen and pelvis was performed following the standard protocol during bolus administration of intravenous contrast. CONTRAST:  60mL ISOVUE-300 IOPAMIDOL (ISOVUE-300) INJECTION 61% COMPARISON:  Chest CT, 12/16/2015 FINDINGS: CT CHEST FINDINGS Cardiovascular: Heart is mildly enlarged. Small amount of  pericardial fluid. Three-vessel coronary artery calcifications. Aorta is normal in caliber. No dissection. There is mild atherosclerosis along the arch and descending thoracic aorta. Main pulmonary artery is mildly dilated to 3.1 cm. Mild dilation of the right pulmonary artery, 2.9 cm. Prominent left pulmonary artery, 2.7 cm. Mediastinum/Nodes: Thyroid normal in size. No neck base or axillary masses or enlarged lymph nodes. No mediastinal or hilar masses or adenopathy. Trachea is patent. Esophagus is unremarkable. Lungs/Pleura: Reticular and mild hazy intervening opacities are noted in the mid to lower right upper lobe and right lower lobe, and to a lesser degree along the posterior right middle lobe. These opacities were present to a similar degree on the prior chest CT. This may reflect atelectasis/scarring. Infection is possible but less likely. Left lung is clear. No evidence of pulmonary edema. No pleural effusion or pneumothorax. CT ABDOMEN PELVIS FINDINGS Hepatobiliary: No focal liver abnormality is seen. No gallstones, gallbladder wall thickening, or biliary dilatation. Pancreas: Unremarkable. No pancreatic ductal dilatation or surrounding inflammatory changes. Spleen: Spleen is borderline enlarged measuring 12.2 cm. There is a 16 mm low-density lesion in the spleen that is decreased in size from prior CT where it measured 3.7 cm. No other splenic masses or lesions. Adrenals/Urinary Tract: No adrenal masses. Left greater than right renal cortical thinning with more focal areas of scarring in the left mid to upper pole. Two tiny nonobstructing stones in the left kidney at the midpole. No renal masses. No hydronephrosis. Ureters are difficult to follow, but are nondilated with no convincing stones. Bladder is unremarkable. Stomach/Bowel: Rectum is markedly distended with stool. Significant increased stool extends into the lower sigmoid colon which is dilated 9 cm. Stomach is unremarkable. No small bowel  dilation or wall thickening. No convincing colonic wall thickening or inflammation. Evaluation of bowel is limited by the minimal peritoneal fat and lack of contrast. No appendix visualized. Vascular/Lymphatic: 19 mm enlarged right inguinal lymph node. No other adenopathy. Atherosclerotic changes are noted throughout a normal caliber abdominal aorta. Reproductive: Uterus not visualize, it appears to be surgically absent. No adnexal masses. Other: The abdominal wall bulges to the right, but the fascia appears intact. No convincing hernia. No ascites. MUSCULOSKELETAL FINDINGS Chronic displaced fracture of the left proximal humerus. No convincing acute fracture. No osteoblastic or osteolytic lesions. IMPRESSION: 1. Right lung shows mild reticular intervening hazy type opacities, with associated volume loss, similar to the prior CT. These findings are most likely chronic due to chronic atelectasis/scarring. Infection is possible but less likely. 2. Mild cardiomegaly.  Small pericardial effusion. 3. Mild enlargement of the main and right pulmonary arteries and prominence of the left pulmonary artery. Consider pulmonary hypertension. 4. Aortic atherosclerosis. Marked distention of the rectum with stool with significant dilation of the sigmoid colon with stool. Findings are consistent with a rectal impaction. No evidence of bowel obstruction and no convincing bowel inflammation. 5. Single apparent enlarged right inguinal lymph node measuring 19 mm in short axis. 6. Cystic lesion in the spleen significantly decreased in size from the prior CT. 7. Aortic atherosclerosis. Electronically Signed   By: Amie Portland M.D.   On: 11/08/2017 17:54   Dg Chest Portable 1 View  Result Date: 11/08/2017 CLINICAL DATA:  Pt arrived via EMS from home d/t vomiting and decrease mental status noticed by care takers. EMS reports pt is nonverbal and contracted at baseline. EMS reports pt has a fractured left arm and multiple ulcers on her  legs and back. Hx - HTN, non-smoker. EXAM: PORTABLE  CHEST 1 VIEW COMPARISON:  10/13/2016 FINDINGS: Cardiac silhouette is mildly enlarged. No mediastinal or hilar masses. There are prominent bronchovascular markings. Lungs are otherwise clear. No convincing pleural effusion. No pneumothorax. Bony thorax is demineralized. There is a chronic displaced fracture of the proximal left humerus stable from the prior study. IMPRESSION: No acute cardiopulmonary disease. Electronically Signed   By: Amie Portlandavid  Ormond M.D.   On: 11/08/2017 16:39    ASSESSMENT AND PLAN:   Active Problems:   Sepsis (HCC)   Pressure injury of skin  * Sepsis, and no clear source. Continue vancomycin and Zosyn, follow-up CBC and cultures. IV fluids support.   Appreciated Wound care nurse help. As per her, Wound does not look infected.  May be she had viral gastritis. Cont to monitor.  * Hypertension.  Continue Toprol.  Hold if blood pressure is low. * Dementia.  Continue home dementia medications, aspiration the fall precaution. * Anemia of chronic disease.  Stable. * Severe malnutrition.  Dietitian consult.   All the records are reviewed and case discussed with Care Management/Social Workerr. Management plans discussed with the patient, family and they are in agreement.  CODE STATUS: DNR  TOTAL TIME TAKING CARE OF THIS PATIENT: 35 minutes.    POSSIBLE D/C IN 1-2 DAYS, DEPENDING ON CLINICAL CONDITION.   Altamese DillingVaibhavkumar Demoni Gergen M.D on 11/09/2017   Between 7am to 6pm - Pager - [phone]  After 6pm go to [email or web address] - password [email or web address]  [facility]  Office  [phone]  CC: Primary care physician; Steele Sizerrissman, Mark A, MD  Note: This dictation was prepared with Dragon dictation along with smaller phrase technology. Any transcriptional errors that result from this process are unintentional.

## 2017-11-09 NOTE — Evaluation (Signed)
Clinical/Bedside Swallow Evaluation Patient Details  Name: [name] MRN: [id number] Date of Birth: 1923-03-04  Today's Date: 11/09/2017 Time: SLP Start Time (ACUTE ONLY): 1345 SLP Stop Time (ACUTE ONLY): 1445 SLP Time Calculation (min) (ACUTE ONLY): 60 min  Past Medical History:  Past Medical History:  Diagnosis Date  . Anemia   . Arm fracture 03/2016   left proximal humerus  . Arthritis   . B12 deficiency   . Hypertension   . Mild dementia   . Parkinson disease (HCC)   . Tremor    Past Surgical History:  Past Surgical History:  Procedure Laterality Date  . ABDOMINAL HYSTERECTOMY    . JOINT REPLACEMENT     HPI:  Pt is a 84 y.o. female with a known history of Parkinson's Dis, dementia, anemia.  The patient was sent from home to ED due to above chief complaints.  The patient has dementia and bedbound.  She has a history of bedsores on her sacral and medial right leg.  She was found fever 104 and vomiting once at home.  She has tachycardia tachypnea and leukocytosis in the ED.  Sepsis protocol is started by Dr. Juliette AlcideMelinda.  Chest x-ray did not show any infiltrate and the urinalysis did not show any UTI.  But the CAT scan of the abdomen show constipation.  She had had a bowel movement in the ED. Pt has a baseline of dysphagia per the Dtr's report - thickener is used at home to thicken liquids; prepared foods.   Assessment / Plan / Recommendation Clinical Impression  Pt appears to present w/ moderate oropharyngeal phase dysphagia suspect impacted by her declined medical and Cognitive status'. Pt presented w/ contracted UEs. Pt was nonverbal but did look to SLP and Dtr 3-4x then closed eyes again; a slightly stiff, open mouth posture at baseline. Pt required moderate verbal/tactile cues by Dtr and SLP. Pt appeared to exhibit some oral attention to boluses but demonstrated prolonged oral mastication and lingual movements for bolus management then transfer for swallowing; mastication  appeared uncoordinated and effortful at times. Given time, pt appeared to orally clear b/t trials. No immediate, overt s/s of aspiration noted w/ trials given - pt has been on a a modified diet w/ NECTAR consistency liquids at home per Dtr and requires full feeding assistance by caregivers. Pt also uses a Sippy Drink Cup per Dtr. Recommend continue a Dysphagia level 2(MINCED foods, moistened) w/ NECTAR liquids w/ strict aspiration precautions and feeding support at meals. Recommend Pills in puree - Crushed. Discussed w/ Dtr that once pt is more alert and exhibiting increased strength, she may have the stamina for masticating increased textured foods but that for now, the Minced diet would be recommended to lessen demand of exertion and lessen risk for choking on unchewed pieces of foods. Dtr agreed. ST services will f/u for toleration of diet and education while admitted.   SLP Visit Diagnosis: Dysphagia, oropharyngeal phase (R13.12)    Aspiration Risk  Moderate aspiration risk;Risk for inadequate nutrition/hydration    Diet Recommendation  Dysphagia level 2(MINCED foods, moistened well), NECTAR consistency liquids; aspiration precautions; feeding support at all meals.  Medication Administration: Crushed with puree(as able to)    Other  Recommendations Recommended Consults: (Dietician f/u) Oral Care Recommendations: Oral care BID;Staff/trained caregiver to provide oral care Other Recommendations: Order thickener from pharmacy;Prohibited food (jello, ice cream, thin soups);Remove water pitcher;Have oral suction available   Follow up Recommendations None      Frequency and Duration min  2x/week  1 week       Prognosis Prognosis for Safe Diet Advancement: Fair Barriers to Reach Goals: Cognitive deficits;Severity of deficits;Time post onset(baseline dysphagia)      Swallow Study   General Date of Onset: 11/08/17 HPI: Pt is a 84 y.o. female with a known history of Parkinson's Dis, dementia,  anemia.  The patient was sent from home to ED due to above chief complaints.  The patient has dementia and bedbound.  She has a history of bedsores on her sacral and medial right leg.  She was found fever 104 and vomiting once at home.  She has tachycardia tachypnea and leukocytosis in the ED.  Sepsis protocol is started by Dr. Juliette Alcide.  Chest x-ray did not show any infiltrate and the urinalysis did not show any UTI.  But the CAT scan of the abdomen show constipation.  She had had a bowel movement in the ED. Pt has a baseline of dysphagia per the Dtr's report - thickener is used at home to thicken liquids; prepared foods. Type of Study: Bedside Swallow Evaluation Previous Swallow Assessment: unsure Diet Prior to this Study: Dysphagia 2 (chopped);Nectar-thick liquids(per MD at admission) Temperature Spikes Noted: Yes(temp 101.5;  wbc 18.1) Respiratory Status: Room air History of Recent Intubation: No Behavior/Cognition: Confused;Distractible;Requires cueing;Doesn't follow directions(awake, reduced response; contracted) Oral Cavity Assessment: Dry Oral Care Completed by SLP: Recent completion by staff Oral Cavity - Dentition: Adequate natural dentition Vision: (n/a) Self-Feeding Abilities: Total assist Patient Positioning: Upright in bed(head slightly back - attempted to adjust head foward) Baseline Vocal Quality: (nonverbal) Volitional Cough: Cognitively unable to elicit Volitional Swallow: Unable to elicit    Oral/Motor/Sensory Function Overall Oral Motor/Sensory Function: Moderate impairment(reduced oral movements overall; stiff posture)   Ice Chips Ice chips: Not tested   Thin Liquid Thin Liquid: Not tested    Nectar Thick Nectar Thick Liquid: Impaired Presentation: Spoon;Cup(fed) Oral Phase Impairments: Reduced labial seal;Reduced lingual movement/coordination Oral phase functional implications: Prolonged oral transit Pharyngeal Phase Impairments: (none)   Honey Thick Honey Thick  Liquid: Not tested   Puree Puree: Impaired Presentation: Spoon(fed) Oral Phase Impairments: Reduced labial seal;Reduced lingual movement/coordination Oral Phase Functional Implications: Prolonged oral transit Pharyngeal Phase Impairments: (none)   Solid   GO   Solid: Impaired(MINCED/ broken down foods) Presentation: (fed) Oral Phase Impairments: Reduced labial seal;Reduced lingual movement/coordination;Impaired mastication Oral Phase Functional Implications: Prolonged oral transit;Impaired mastication Pharyngeal Phase Impairments: (none) Other Comments: mod cues given for all trials by SLP and Dtr         Jerilynn Som, MS, CCC-SLP Watson,Katherine 11/09/2017,5:50 PM

## 2017-11-09 NOTE — Consult Note (Signed)
WOC Nurse wound consult note Reason for Consult: Stage 3 pressure injury to coccyx, present on admission.  Seen at wound care center.   Wound type:stage 3 pressure injury Pressure Injury POA: Yes Measurement: 5 cm x 4.1 cm x 0.4 cm  Wound OZD:GUYQIbed:ruddy red.  Maceration from 4 to 7 o'clock dark tissue noted from 10-12 o'clock.  Daughter at bedside and indicates that this has been there, but may have declined slightly.  Will order low air loss bed for patient. Albumin 3.4, down from 4.4 07/26/17.  Daughter is feeding her at bedside and intake is fair.  Drainage (amount, consistency, odor) Minimal serosanguinous  No odor Periwound:blanchable erythema.  Patient is impacted and has had several bowel movements.  Will avoid soiling wound.  Will add occlusive dressing today.  Dressing procedure/placement/frequency:Cleanse sacral wound with NS and pat gently dry.  Apply Aquacel AG surgical wound.  Silver hydrofiber with occlusive hydrocolloid dressing will protect from insult of feces. Change Monday/Wednesday/Friday.  Will not follow at this time.  Please re-consult if needed.  Maple HudsonKaren Terril Chestnut RN BSN CWON Pager [phone]

## 2017-11-09 NOTE — Progress Notes (Signed)
Initial Nutrition Assessment  DOCUMENTATION CODES:   Severe malnutrition in context of chronic illness, Underweight  INTERVENTION:  Downgraded diet to dysphagia 2 with nectar-thick liquids, which is her baseline diet per daughter.  Provide Hormel Shake TID with trays, each supplement provides 520 kcal and 22 grams of protein.  Provide daily MVI.  Provide vitamin C 500 mg BID.  NUTRITION DIAGNOSIS:   Severe Malnutrition related to chronic illness(dementia, Parkinson's disease, advanced age, stage III pressure ulcer) as evidenced by severe fat depletion, severe muscle depletion.  GOAL:   Patient will meet greater than or equal to 90% of their needs  MONITOR:   PO intake, Supplement acceptance, Labs, Weight trends, Skin, I & O's  REASON FOR ASSESSMENT:   Consult Assessment of nutrition requirement/status  ASSESSMENT:   84 year old female with PMHx of dementia, anemia, arthritis, HTN, vitamin B12 deficiency, Parkinson's disease who presented with vomiting and weakness and admitted with sepsis (no clear source at this time).   -Per chart patient's sacral wound does not appear infected at this time.  Met with patient and her daughter at bedside. Patient sleeping through assessment. She is bed-bound and contracted. Daughter reports the patient lives at home and has 24/7 care. She typically has a good appetite, but unable to specify exact usual intake. She eats finely chopped food with nectar-thick liquids. Daughter reports she typically drinks 3 Ensure daily thickened to appropriate consistency. Her wound developed in April. She takes MVI, calcium, and vitamin D daily.   Daughter reports she has been weight-stable for the past year. Per chart she was 96.4 lbs on 10/13/2016 and has lost about 11.4 lbs (11.8% body weight) over the past year, which is not significant for time frame.  Medications reviewed and include: Colace, NS @ 75 mL/hr, Zosyn, vancomycin.  Labs reviewed: Glucose  141, Creatinine 0.43.  Discussed with SLP.  NUTRITION - FOCUSED PHYSICAL EXAM:    Most Recent Value  Orbital Region  Severe depletion  Upper Arm Region  Severe depletion  Thoracic and Lumbar Region  Severe depletion  Buccal Region  Severe depletion  Temple Region  Severe depletion  Clavicle Bone Region  Severe depletion  Clavicle and Acromion Bone Region  Severe depletion  Scapular Bone Region  Severe depletion  Dorsal Hand  Severe depletion  Patellar Region  Severe depletion  Anterior Thigh Region  Severe depletion  Posterior Calf Region  Severe depletion  Edema (RD Assessment)  None  Hair  Reviewed  Eyes  Unable to assess  Mouth  Unable to assess  Skin  Reviewed [scattered ecchymosis]  Nails  Unable to assess     Diet Order:  Aspiration precautions Fall precautions DIET DYS 2 Room service appropriate? Yes with Assist; Fluid consistency: Nectar Thick  EDUCATION NEEDS:   Not appropriate for education at this time  Skin:  Skin Assessment: Skin Integrity Issues: Skin Integrity Issues:: Stage II, Stage III Stage II: right chest (5cm x 5cm) Stage III: sacrum (6cm x 5cm)  Last BM:  11/09/2017 - large type 5  Height:   Ht Readings from Last 1 Encounters:  11/08/17 '4\' 9"'$  (1.448 m)    Weight:   Wt Readings from Last 1 Encounters:  11/08/17 85 lb (38.6 kg)    Ideal Body Weight:  43.2 kg  BMI:  Body mass index is 18.39 kg/m.  Estimated Nutritional Needs:   Kcal:  1200-1400  Protein:  60-70 grams (1.5-1.8 grams/kg)  Fluid:  1.2-1.4 L/day  Willey Blade, MS, RD,  LDN Office: [phone] Pager: [phone] After Hours/Weekend Pager: [phone]

## 2017-11-09 NOTE — Progress Notes (Signed)
PHARMACY - PHYSICIAN COMMUNICATION CRITICAL VALUE ALERT - BLOOD CULTURE IDENTIFICATION (BCID)  Results for orders placed or performed during the hospital encounter of 11/08/17  Blood Culture ID Panel (Reflexed) (Collected: 11/08/2017  4:03 PM)  Result Value Ref Range   Enterococcus species NOT DETECTED NOT DETECTED   Vancomycin resistance NOT DETECTED NOT DETECTED   Listeria monocytogenes NOT DETECTED NOT DETECTED   Staphylococcus species DETECTED (A) NOT DETECTED   Staphylococcus aureus NOT DETECTED NOT DETECTED   Methicillin resistance DETECTED (A) NOT DETECTED   Streptococcus species NOT DETECTED NOT DETECTED   Streptococcus agalactiae NOT DETECTED NOT DETECTED   Streptococcus pneumoniae NOT DETECTED NOT DETECTED   Streptococcus pyogenes NOT DETECTED NOT DETECTED   Acinetobacter baumannii NOT DETECTED NOT DETECTED   Enterobacteriaceae species NOT DETECTED NOT DETECTED   Enterobacter cloacae complex NOT DETECTED NOT DETECTED   Escherichia coli NOT DETECTED NOT DETECTED   Klebsiella oxytoca NOT DETECTED NOT DETECTED   Klebsiella pneumoniae NOT DETECTED NOT DETECTED   Proteus species NOT DETECTED NOT DETECTED   Serratia marcescens NOT DETECTED NOT DETECTED   Carbapenem resistance NOT DETECTED NOT DETECTED   Haemophilus influenzae NOT DETECTED NOT DETECTED   Neisseria meningitidis NOT DETECTED NOT DETECTED   Pseudomonas aeruginosa NOT DETECTED NOT DETECTED   Candida albicans NOT DETECTED NOT DETECTED   Candida glabrata NOT DETECTED NOT DETECTED   Candida krusei NOT DETECTED NOT DETECTED   Candida parapsilosis NOT DETECTED NOT DETECTED   Candida tropicalis NOT DETECTED NOT DETECTED    Name of physician (or Provider) Contacted:  Shreyang Patel   Changes to prescribed antibiotics required: No, continue pt on Vancomycin.   Evagelia Knack D 11/09/2017  4:46 PM

## 2017-11-10 ENCOUNTER — Ambulatory Visit: Payer: Medicare Other | Admitting: Physician Assistant

## 2017-11-10 DIAGNOSIS — E43 Unspecified severe protein-calorie malnutrition: Secondary | ICD-10-CM

## 2017-11-10 LAB — BASIC METABOLIC PANEL
Anion gap: 6 (ref 5–15)
BUN: 21 mg/dL — ABNORMAL HIGH (ref 6–20)
CO2: 22 mmol/L (ref 22–32)
Calcium: 9 mg/dL (ref 8.9–10.3)
Chloride: 112 mmol/L — ABNORMAL HIGH (ref 101–111)
Creatinine, Ser: 0.54 mg/dL (ref 0.44–1.00)
GFR calc Af Amer: >60 mL/min (ref >60)
GFR calc non Af Amer: >60 mL/min (ref >60)
Glucose, Bld: 107 mg/dL — ABNORMAL HIGH (ref 65–99)
Potassium: 3.9 mmol/L (ref 3.5–5.1)
Sodium: 140 mmol/L (ref 135–145)

## 2017-11-10 LAB — BLOOD CULTURE ID PANEL (REFLEXED)

## 2017-11-10 LAB — CBC
HCT: 27.6 % — ABNORMAL LOW (ref 35.0–47.0)
Hemoglobin: 9.2 g/dL — ABNORMAL LOW (ref 12.0–16.0)
MCH: 31.8 pg (ref 26.0–34.0)
MCHC: 33.4 g/dL (ref 32.0–36.0)
MCV: 95.3 fL (ref 80.0–100.0)
Platelets: 196 10*3/uL (ref 150–440)
RBC: 2.89 MIL/uL — ABNORMAL LOW (ref 3.80–5.20)
RDW: 14 % (ref 11.5–14.5)
WBC: 7.4 10*3/uL (ref 3.6–11.0)

## 2017-11-10 MED ORDER — NYSTATIN 100000 UNIT/ML MT SUSP
5.0000 mL | Freq: Four times a day (QID) | OROMUCOSAL | Status: DC
  Administered 2017-11-10 – 2017-11-11 (×4): 500000 [IU] via OROMUCOSAL
  Filled 2017-11-10 (×6): qty 5

## 2017-11-10 NOTE — Consult Note (Addendum)
[facility] Infectious Disease     Reason for Consult: CNS  Bacteremia   Referring Physician: Doylene Canning Date of Admission:  11/08/2017   Active Problems:   Sepsis ([place])   Pressure injury of skin   Protein-calorie malnutrition, severe   HPI: [name] is a 84 y.o. female admitted with fever to 104 and NV. She has hx of dementia, and is largely bedbound with sacral decub ulcer On admit temp 103, wbc 15- > up to 18.  She has been following with wound care center for the sacral ulcer.  Last seen 10/27/17.  She has hx of parkinsons disease and dementia.  Since admit [place] + 1/2 CNS. UA only 0-5 wbc,  UCX neg. Flu PCR neg, CT abd shows fecal impaction. CT chest shows chronic changes.   Hx is provided by her daughter at bedside and review of chart since pt is nonverbal. Daughter reports patient lives at her own home with husband and has 24/7 care. She reports patient is improved to near baseline as of this morning.    Past Medical History:  Diagnosis Date  . Anemia   . Arm fracture 03/2016   left proximal humerus  . Arthritis   . B12 deficiency   . Hypertension   . Mild dementia   . Parkinson disease ([place])   . Tremor    Past Surgical History:  Procedure Laterality Date  . ABDOMINAL HYSTERECTOMY    . JOINT REPLACEMENT     Social History   Tobacco Use  . Smoking status: Never Smoker  . Smokeless tobacco: Never Used  Substance Use Topics  . Alcohol use: No    Alcohol/week: 0.0 oz  . Drug use: No   Family History  Family history unknown: Yes    Allergies:  Allergies  Allergen Reactions  . Phenergan [Promethazine Hcl] Other (See Comments)    Family states the patient almost went into a coma.  . Sulfa Antibiotics Other (See Comments)    Unknown reaction  . Sulfasalazine Other (See Comments)    Unknown reaction    Current antibiotics: Antibiotics Given (last 72 hours)    Date/Time Action Medication Dose Rate   11/08/17 1655 New Bag/Given    piperacillin-tazobactam (ZOSYN) IVPB 3.375 g 3.375 g 100 mL/hr   11/08/17 1848 New Bag/Given   vancomycin (VANCOCIN) 500 mg in sodium chloride 0.9 % 100 mL IVPB 500 mg 100 mL/hr   11/08/17 2201 New Bag/Given   piperacillin-tazobactam (ZOSYN) IVPB 3.375 g 3.375 g 12.5 mL/hr   11/09/17 0521 New Bag/Given   piperacillin-tazobactam (ZOSYN) IVPB 3.375 g 3.375 g 12.5 mL/hr   11/09/17 0527 New Bag/Given   vancomycin (VANCOCIN) 500 mg in sodium chloride 0.9 % 100 mL IVPB 500 mg 100 mL/hr   11/09/17 1325 New Bag/Given   piperacillin-tazobactam (ZOSYN) IVPB 3.375 g 3.375 g 12.5 mL/hr   11/09/17 2218 New Bag/Given   piperacillin-tazobactam (ZOSYN) IVPB 3.375 g 3.375 g 12.5 mL/hr   11/10/17 0620 New Bag/Given   vancomycin (VANCOCIN) 500 mg in sodium chloride 0.9 % 100 mL IVPB 500 mg 100 mL/hr   11/10/17 1749 New Bag/Given   piperacillin-tazobactam (ZOSYN) IVPB 3.375 g 3.375 g 12.5 mL/hr   11/10/17 1346 New Bag/Given   piperacillin-tazobactam (ZOSYN) IVPB 3.375 g 3.375 g 12.5 mL/hr      MEDICATIONS: . Carbidopa-Levodopa ER  1 tablet Oral BID  . docusate sodium  100 mg Oral BID  . enoxaparin (LOVENOX) injection  30 mg Subcutaneous Q24H  .  metoprolol succinate  25 mg Oral Daily  . multivitamin with minerals  1 tablet Oral Daily  . nystatin  5 mL Mouth/Throat QID  . vitamin C  500 mg Oral BID    Review of Systems - unable to obtain  OBJECTIVE: Temp:  [98.5 F (36.9 C)-99.2 F (37.3 C)] 99.1 F (37.3 C) (02/12 1329) Pulse Rate:  [66-85] 85 (02/12 1329) Resp:  [18] 18 (02/12 1329) BP: (140-148)/(43-99) 148/80 (02/12 1329) SpO2:  [91 %-100 %] 96 % (02/12 1329) Physical Exam  Constitutional:  Thin, frail, lying in bed on R side, flexure contractures in arms and legs HENT: [place]/AT,, no scleral icterus Mouth/Throat: Oropharynx is clear and dry . No oropharyngeal exudate.  Cardiovascular: Normal rate, regular rhythm and normal heart sounds.  Pulmonary/Chest: Effort normal and breath sounds  normal. No respiratory distress.  has no wheezes.  Neck = supple, no nuchal rigidity Abdominal: Soft. Bowel sounds are normal.  exhibits mod distension. There is no tenderness.  Lymphadenopathy: no cervical adenopathy. No axillary adenopathy Neurological: lethargic, non verbal, contracted  Skin: skin tears bil LE, Sacral wound 5x4 cm with some dusky discoloration and mild necrotic tissue.  Psychiatric: nonverbal  LABS: Results for orders placed or performed during the hospital encounter of 11/08/17 (from the past 48 hour(s))  Comprehensive metabolic panel     Status: Abnormal   Collection Time: 11/08/17  4:02 PM  Result Value Ref Range   Sodium 138 135 - 145 mmol/L   Potassium 3.7 3.5 - 5.1 mmol/L   Chloride 105 101 - 111 mmol/L   CO2 23 22 - 32 mmol/L   Glucose, Bld 154 (H) 65 - 99 mg/dL   BUN 19 6 - 20 mg/dL   Creatinine, Ser 0.40 (L) 0.44 - 1.00 mg/dL   Calcium 9.3 8.9 - 10.3 mg/dL   Total Protein 6.4 (L) 6.5 - 8.1 g/dL   Albumin 3.4 (L) 3.5 - 5.0 g/dL   AST 27 15 - 41 U/L   ALT 11 (L) 14 - 54 U/L   Alkaline Phosphatase 66 38 - 126 U/L   Total Bilirubin 0.9 0.3 - 1.2 mg/dL   GFR calc non Af Amer >60 >60 mL/min   GFR calc Af Amer >60 >60 mL/min    Comment: (NOTE) The eGFR has been calculated using the CKD EPI equation. This calculation has not been validated in all clinical situations. eGFR's persistently <60 mL/min signify possible Chronic Kidney Disease.    Anion gap 10 5 - 15    Comment: Performed at [facility], [address] [place], [place] [address]  Troponin I     Status: None   Collection Time: 11/08/17  4:02 PM  Result Value Ref Range   Troponin I <0.03 <0.03 ng/mL    Comment: Performed at [facility], [address] [place], [place] [address]  CBC with Differential     Status: Abnormal   Collection Time: 11/08/17  4:02 PM  Result Value Ref Range   WBC 15.1 (H) 3.6 - 11.0 K/uL   RBC 3.56 (L) 3.80 - 5.20 MIL/uL   Hemoglobin 11.2 (L)  12.0 - 16.0 g/dL   HCT 33.7 (L) 35.0 - 47.0 %   MCV 94.5 80.0 - 100.0 fL   MCH 31.5 26.0 - 34.0 pg   MCHC 33.3 32.0 - 36.0 g/dL   RDW 13.7 11.5 - 14.5 %   Platelets 249 150 - 440 K/uL   Neutrophils Relative % 90 %   Neutro  Abs 13.5 (H) 1.4 - 6.5 K/uL   Lymphocytes Relative 3 %   Lymphs Abs 0.5 (L) 1.0 - 3.6 K/uL   Monocytes Relative 7 %   Monocytes Absolute 1.1 (H) 0.2 - 0.9 K/uL   Eosinophils Relative 0 %   Eosinophils Absolute 0.0 0 - 0.7 K/uL   Basophils Relative 0 %   Basophils Absolute 0.1 0 - 0.1 K/uL    Comment: Performed at [facility], [address] [place], [place] [address]  Procalcitonin     Status: None   Collection Time: 11/08/17  4:02 PM  Result Value Ref Range   Procalcitonin 0.52 ng/mL    Comment:        Interpretation: PCT > 0.5 ng/mL and <= 2 ng/mL: Systemic infection (sepsis) is possible, but other conditions are known to elevate PCT as well. (NOTE)       Sepsis PCT Algorithm           Lower Respiratory Tract                                      Infection PCT Algorithm    ----------------------------     ----------------------------         PCT < 0.25 ng/mL                PCT < 0.10 ng/mL         Strongly encourage             Strongly discourage   discontinuation of antibiotics    initiation of antibiotics    ----------------------------     -----------------------------       PCT 0.25 - 0.50 ng/mL            PCT 0.10 - 0.25 ng/mL               OR       >80% decrease in PCT            Discourage initiation of                                            antibiotics      Encourage discontinuation           of antibiotics    ----------------------------     -----------------------------         PCT >= 0.50 ng/mL              PCT 0.26 - 0.50 ng/mL                AND       <80% decrease in PCT             Encourage initiation of                                             antibiotics       Encourage continuation           of antibiotics     ----------------------------     -----------------------------        PCT >= 0.50 ng/mL  PCT > 0.50 ng/mL               AND         increase in PCT                  Strongly encourage                                      initiation of antibiotics    Strongly encourage escalation           of antibiotics                                     -----------------------------                                           PCT <= 0.25 ng/mL                                                 OR                                        > 80% decrease in PCT                                     Discontinue / Do not initiate                                             antibiotics Performed at [facility], [address] [place], [place] [address]   Lactic acid, plasma     Status: None   Collection Time: 11/08/17  4:03 PM  Result Value Ref Range   Lactic Acid, Venous 1.5 0.5 - 1.9 mmol/L    Comment: Performed at [facility], [address] [place], [place] [address]  Urinalysis, Complete w Microscopic     Status: Abnormal   Collection Time: 11/08/17  4:03 PM  Result Value Ref Range   Color, Urine YELLOW (A) YELLOW   APPearance CLEAR (A) CLEAR   Specific Gravity, Urine 1.016 1.005 - 1.030   pH 6.0 5.0 - 8.0   Glucose, UA NEGATIVE NEGATIVE mg/dL   Hgb urine dipstick NEGATIVE NEGATIVE   Bilirubin Urine NEGATIVE NEGATIVE   Ketones, ur 5 (A) NEGATIVE mg/dL   Protein, ur NEGATIVE NEGATIVE mg/dL   Nitrite NEGATIVE NEGATIVE   Leukocytes, UA NEGATIVE NEGATIVE   RBC / HPF 0-5 0 - 5 RBC/hpf   WBC, UA 0-5 0 - 5 WBC/hpf   Bacteria, UA NONE SEEN NONE SEEN   Squamous Epithelial / LPF 0-5 (A) NONE SEEN   Mucus PRESENT    Hyaline Casts, UA PRESENT     Comment: Performed at [facility], [address], [place], [place] [address]  Urine culture  Status: None   Collection Time: 11/08/17  4:03 PM  Result Value Ref Range   Specimen Description      URINE,  CATHETERIZED Performed at [facility], [address] [place], [place] [address]    Special Requests      NONE Performed at [facility], [address], [place], [place] [address]    Culture      NO GROWTH Performed at [facility], [place] [address], [place], [place] [address]    Report Status 11/09/2017 FINAL   Culture, blood (routine x 2)     Status: None (Preliminary result)   Collection Time: 11/08/17  4:03 PM  Result Value Ref Range   Specimen Description BLOOD LT WRIST    Special Requests      BOTTLES DRAWN AEROBIC AND ANAEROBIC Blood Culture adequate volume   Culture      NO GROWTH 2 DAYS Performed at [facility], [address], [place], [place] [address]    Report Status PENDING   Culture, blood (routine x 2)     Status: None (Preliminary result)   Collection Time: 11/08/17  4:03 PM  Result Value Ref Range   Specimen Description BLOOD LT AC    Special Requests      BOTTLES DRAWN AEROBIC AND ANAEROBIC Blood Culture adequate volume   Culture  Setup Time      Organism ID to follow GRAM POSITIVE COCCI AEROBIC BOTTLE ONLY CRITICAL RESULT CALLED TO, READ BACK BY AND VERIFIED WITH: JASON ROBBINS AT 0300 11/09/17. MSS Performed at [facility], [address] [place], [place] [address]    Culture GRAM POSITIVE COCCI    Report Status PENDING   Blood Culture ID Panel (Reflexed)     Status: Abnormal   Collection Time: 11/08/17  4:03 PM  Result Value Ref Range   Enterococcus species NOT DETECTED NOT DETECTED   Vancomycin resistance NOT DETECTED NOT DETECTED   Listeria monocytogenes NOT DETECTED NOT DETECTED   Staphylococcus species DETECTED (A) NOT DETECTED    Comment: CRITICAL RESULT CALLED TO, READ BACK BY AND VERIFIED WITH: JASON ROBBINS AT 1610 11/09/17. MSS Methicillin (oxacillin) resistant coagulase negative staphylococcus. Possible blood culture contaminant (unless isolated from more than one blood culture draw or  clinical case suggests pathogenicity). No antibiotic treatment is indicated for blood  culture contaminants.    Staphylococcus aureus NOT DETECTED NOT DETECTED   Methicillin resistance DETECTED (A) NOT DETECTED    Comment: CRITICAL RESULT CALLED TO, READ BACK BY AND VERIFIED WITH: JASON ROBBINS AT 1610 11/09/17. MSS    Streptococcus species NOT DETECTED NOT DETECTED   Streptococcus agalactiae NOT DETECTED NOT DETECTED   Streptococcus pneumoniae NOT DETECTED NOT DETECTED   Streptococcus pyogenes NOT DETECTED NOT DETECTED   Acinetobacter baumannii NOT DETECTED NOT DETECTED   Enterobacteriaceae species NOT DETECTED NOT DETECTED   Enterobacter cloacae complex NOT DETECTED NOT DETECTED   Escherichia coli NOT DETECTED NOT DETECTED   Klebsiella oxytoca NOT DETECTED NOT DETECTED   Klebsiella pneumoniae NOT DETECTED NOT DETECTED   Proteus species NOT DETECTED NOT DETECTED   Serratia marcescens NOT DETECTED NOT DETECTED   Carbapenem resistance NOT DETECTED NOT DETECTED   Haemophilus influenzae NOT DETECTED NOT DETECTED   Neisseria meningitidis NOT DETECTED NOT DETECTED   Pseudomonas aeruginosa NOT DETECTED NOT DETECTED   Candida albicans NOT DETECTED NOT DETECTED   Candida glabrata NOT DETECTED NOT DETECTED   Candida krusei NOT DETECTED NOT DETECTED   Candida  parapsilosis NOT DETECTED NOT DETECTED   Candida tropicalis NOT DETECTED NOT DETECTED    Comment: Performed at [facility], [address] [place], [place] [address]  Influenza panel by PCR (type A & B)     Status: None   Collection Time: 11/08/17  4:44 PM  Result Value Ref Range   Influenza A By PCR NEGATIVE NEGATIVE   Influenza B By PCR NEGATIVE NEGATIVE    Comment: (NOTE) The Xpert Xpress Flu assay is intended as an aid in the diagnosis of  influenza and should not be used as a sole basis for treatment.  This  assay is FDA approved for nasopharyngeal swab specimens only. Nasal  washings and aspirates are unacceptable  for Xpert Xpress Flu testing. Performed at [facility], [address] [place], [place] [address]   Lactic acid, plasma     Status: None   Collection Time: 11/08/17  9:27 PM  Result Value Ref Range   Lactic Acid, Venous 1.2 0.5 - 1.9 mmol/L    Comment: Performed at [facility], [address] [place], [place] [address]  Protime-INR     Status: None   Collection Time: 11/08/17 10:35 PM  Result Value Ref Range   Prothrombin Time 14.6 11.4 - 15.2 seconds   INR 1.15     Comment: Performed at [facility], [address] [place], [place] [address]  APTT     Status: None   Collection Time: 11/08/17 10:35 PM  Result Value Ref Range   aPTT 30 24 - 36 seconds    Comment: Performed at [facility], [address] [place], [place] [address]  Basic metabolic panel     Status: Abnormal   Collection Time: 11/09/17  3:47 AM  Result Value Ref Range   Sodium 139 135 - 145 mmol/L   Potassium 3.7 3.5 - 5.1 mmol/L   Chloride 107 101 - 111 mmol/L   CO2 23 22 - 32 mmol/L   Glucose, Bld 141 (H) 65 - 99 mg/dL   BUN 20 6 - 20 mg/dL   Creatinine, Ser 0.43 (L) 0.44 - 1.00 mg/dL   Calcium 9.0 8.9 - 10.3 mg/dL   GFR calc non Af Amer >60 >60 mL/min   GFR calc Af Amer >60 >60 mL/min    Comment: (NOTE) The eGFR has been calculated using the CKD EPI equation. This calculation has not been validated in all clinical situations. eGFR's persistently <60 mL/min signify possible Chronic Kidney Disease.    Anion gap 9 5 - 15    Comment: Performed at [facility], [address] [place], [place] [address]  CBC     Status: Abnormal   Collection Time: 11/09/17  3:47 AM  Result Value Ref Range   WBC 18.1 (H) 3.6 - 11.0 K/uL   RBC 3.11 (L) 3.80 - 5.20 MIL/uL   Hemoglobin 9.8 (L) 12.0 - 16.0 g/dL   HCT 29.4 (L) 35.0 - 47.0 %   MCV 94.5 80.0 - 100.0 fL   MCH 31.4 26.0 - 34.0 pg   MCHC 33.3 32.0 - 36.0 g/dL   RDW 13.8 11.5 - 14.5 %   Platelets 244 150 -  440 K/uL    Comment: Performed at [facility], [address] [place], [place] [address]  CBC     Status: Abnormal   Collection Time: 11/10/17  4:52 AM  Result Value Ref Range   WBC 7.4 3.6 - 11.0 K/uL  RBC 2.89 (L) 3.80 - 5.20 MIL/uL   Hemoglobin 9.2 (L) 12.0 - 16.0 g/dL   HCT 27.6 (L) 35.0 - 47.0 %   MCV 95.3 80.0 - 100.0 fL   MCH 31.8 26.0 - 34.0 pg   MCHC 33.4 32.0 - 36.0 g/dL   RDW 14.0 11.5 - 14.5 %   Platelets 196 150 - 440 K/uL    Comment: Performed at [facility], [address] [place], [place] [address]  Basic metabolic panel     Status: Abnormal   Collection Time: 11/10/17  4:52 AM  Result Value Ref Range   Sodium 140 135 - 145 mmol/L   Potassium 3.9 3.5 - 5.1 mmol/L   Chloride 112 (H) 101 - 111 mmol/L   CO2 22 22 - 32 mmol/L   Glucose, Bld 107 (H) 65 - 99 mg/dL   BUN 21 (H) 6 - 20 mg/dL   Creatinine, Ser 0.54 0.44 - 1.00 mg/dL   Calcium 9.0 8.9 - 10.3 mg/dL   GFR calc non Af Amer >60 >60 mL/min   GFR calc Af Amer >60 >60 mL/min    Comment: (NOTE) The eGFR has been calculated using the CKD EPI equation. This calculation has not been validated in all clinical situations. eGFR's persistently <60 mL/min signify possible Chronic Kidney Disease.    Anion gap 6 5 - 15    Comment: Performed at [facility], [address] [place], [place] [address]   No components found for: ESR, C REACTIVE PROTEIN MICRO: Recent Results (from the past 720 hour(s))  Urine culture     Status: None   Collection Time: 11/08/17  4:03 PM  Result Value Ref Range Status   Specimen Description   Final    URINE, CATHETERIZED Performed at [facility], [address], [place], [place] [address]    Special Requests   Final    NONE Performed at [facility], [address], [place], [place] [address]    Culture   Final    NO GROWTH Performed at [facility], [address] [address], [place], [place] [address]    Report Status  11/09/2017 FINAL  Final  Culture, blood (routine x 2)     Status: None (Preliminary result)   Collection Time: 11/08/17  4:03 PM  Result Value Ref Range Status   Specimen Description BLOOD LT WRIST  Final   Special Requests   Final    BOTTLES DRAWN AEROBIC AND ANAEROBIC Blood Culture adequate volume   Culture   Final    NO GROWTH 2 DAYS Performed at [facility], [address], [place], [place] [address]    Report Status PENDING  Incomplete  Culture, blood (routine x 2)     Status: None (Preliminary result)   Collection Time: 11/08/17  4:03 PM  Result Value Ref Range Status   Specimen Description BLOOD LT [facility]  Final   Special Requests   Final    BOTTLES DRAWN AEROBIC AND ANAEROBIC Blood Culture adequate volume   Culture  Setup Time   Final    Organism ID to follow GRAM POSITIVE COCCI AEROBIC BOTTLE ONLY CRITICAL RESULT CALLED TO, READ BACK BY AND VERIFIED WITH: JASON ROBBINS AT 0300 11/09/17. MSS Performed at [facility], [address] [place], [place] [address]    Culture [facility] POSITIVE COCCI  Final   Report Status PENDING  Incomplete  Blood Culture ID Panel (Reflexed)     Status: Abnormal  Collection Time: 11/08/17  4:03 PM  Result Value Ref Range Status   Enterococcus species NOT DETECTED NOT DETECTED Final   Vancomycin resistance NOT DETECTED NOT DETECTED Final   Listeria monocytogenes NOT DETECTED NOT DETECTED Final   Staphylococcus species DETECTED (A) NOT DETECTED Final    Comment: CRITICAL RESULT CALLED TO, READ BACK BY AND VERIFIED WITH: JASON ROBBINS AT 1610 11/09/17. MSS Methicillin (oxacillin) resistant coagulase negative staphylococcus. Possible blood culture contaminant (unless isolated from more than one blood culture draw or clinical case suggests pathogenicity). No antibiotic treatment is indicated for blood  culture contaminants.    Staphylococcus aureus NOT DETECTED NOT DETECTED Final   Methicillin resistance DETECTED (A) NOT DETECTED  Final    Comment: CRITICAL RESULT CALLED TO, READ BACK BY AND VERIFIED WITH: JASON ROBBINS AT 1610 11/09/17. MSS    Streptococcus species NOT DETECTED NOT DETECTED Final   Streptococcus agalactiae NOT DETECTED NOT DETECTED Final   Streptococcus pneumoniae NOT DETECTED NOT DETECTED Final   Streptococcus pyogenes NOT DETECTED NOT DETECTED Final   Acinetobacter baumannii NOT DETECTED NOT DETECTED Final   Enterobacteriaceae species NOT DETECTED NOT DETECTED Final   Enterobacter cloacae complex NOT DETECTED NOT DETECTED Final   Escherichia coli NOT DETECTED NOT DETECTED Final   Klebsiella oxytoca NOT DETECTED NOT DETECTED Final   Klebsiella pneumoniae NOT DETECTED NOT DETECTED Final   Proteus species NOT DETECTED NOT DETECTED Final   Serratia marcescens NOT DETECTED NOT DETECTED Final   Carbapenem resistance NOT DETECTED NOT DETECTED Final   Haemophilus influenzae NOT DETECTED NOT DETECTED Final   Neisseria meningitidis NOT DETECTED NOT DETECTED Final   Pseudomonas aeruginosa NOT DETECTED NOT DETECTED Final   Candida albicans NOT DETECTED NOT DETECTED Final   Candida glabrata NOT DETECTED NOT DETECTED Final   Candida krusei NOT DETECTED NOT DETECTED Final   Candida parapsilosis NOT DETECTED NOT DETECTED Final   Candida tropicalis NOT DETECTED NOT DETECTED Final    Comment: Performed at [facility], [address] [place], [place] [address]    IMAGING: Ct Chest W Contrast  Result Date: 11/08/2017 CLINICAL DATA:  Pt arrived via EMS from home d/t vomiting and decrease mental status noticed by care takers. EMS reports pt is nonverbal and contracted at baseline. EMS reports pt has a fractured left arm and multiple ulcers on her legs and back. EXAM: CT CHEST, ABDOMEN, AND PELVIS WITH CONTRAST TECHNIQUE: Multidetector CT imaging of the chest, abdomen and pelvis was performed following the standard protocol during bolus administration of intravenous contrast. CONTRAST:  39m ISOVUE-300  IOPAMIDOL (ISOVUE-300) INJECTION 61% COMPARISON:  Chest CT, 12/16/2015 FINDINGS: CT CHEST FINDINGS Cardiovascular: Heart is mildly enlarged. Small amount of pericardial fluid. Three-vessel coronary artery calcifications. Aorta is normal in caliber. No dissection. There is mild atherosclerosis along the arch and descending thoracic aorta. Main pulmonary artery is mildly dilated to 3.1 cm. Mild dilation of the right pulmonary artery, 2.9 cm. Prominent left pulmonary artery, 2.7 cm. Mediastinum/Nodes: Thyroid normal in size. No neck base or axillary masses or enlarged lymph nodes. No mediastinal or hilar masses or adenopathy. Trachea is patent. Esophagus is unremarkable. Lungs/Pleura: Reticular and mild hazy intervening opacities are noted in the mid to lower right upper lobe and right lower lobe, and to a lesser degree along the posterior right middle lobe. These opacities were present to a similar degree on the prior chest CT. This may reflect atelectasis/scarring. Infection is possible but less likely. Left lung is clear. No evidence of pulmonary  edema. No pleural effusion or pneumothorax. CT ABDOMEN PELVIS FINDINGS Hepatobiliary: No focal liver abnormality is seen. No gallstones, gallbladder wall thickening, or biliary dilatation. Pancreas: Unremarkable. No pancreatic ductal dilatation or surrounding inflammatory changes. Spleen: Spleen is borderline enlarged measuring 12.2 cm. There is a 16 mm low-density lesion in the spleen that is decreased in size from prior CT where it measured 3.7 cm. No other splenic masses or lesions. Adrenals/Urinary Tract: No adrenal masses. Left greater than right renal cortical thinning with more focal areas of scarring in the left mid to upper pole. Two tiny nonobstructing stones in the left kidney at the midpole. No renal masses. No hydronephrosis. Ureters are difficult to follow, but are nondilated with no convincing stones. Bladder is unremarkable. Stomach/Bowel: Rectum is markedly  distended with stool. Significant increased stool extends into the lower sigmoid colon which is dilated 9 cm. Stomach is unremarkable. No small bowel dilation or wall thickening. No convincing colonic wall thickening or inflammation. Evaluation of bowel is limited by the minimal peritoneal fat and lack of contrast. No appendix visualized. Vascular/Lymphatic: 19 mm enlarged right inguinal lymph node. No other adenopathy. Atherosclerotic changes are noted throughout a normal caliber abdominal aorta. Reproductive: Uterus not visualize, it appears to be surgically absent. No adnexal masses. Other: The abdominal wall bulges to the right, but the fascia appears intact. No convincing hernia. No ascites. MUSCULOSKELETAL FINDINGS Chronic displaced fracture of the left proximal humerus. No convincing acute fracture. No osteoblastic or osteolytic lesions. IMPRESSION: 1. Right lung shows mild reticular intervening hazy type opacities, with associated volume loss, similar to the prior CT. These findings are most likely chronic due to chronic atelectasis/scarring. Infection is possible but less likely. 2. Mild cardiomegaly.  Small pericardial effusion. 3. Mild enlargement of the main and right pulmonary arteries and prominence of the left pulmonary artery. Consider pulmonary hypertension. 4. Aortic atherosclerosis. Marked distention of the rectum with stool with significant dilation of the sigmoid colon with stool. Findings are consistent with a rectal impaction. No evidence of bowel obstruction and no convincing bowel inflammation. 5. Single apparent enlarged right inguinal lymph node measuring 19 mm in short axis. 6. Cystic lesion in the spleen significantly decreased in size from the prior CT. 7. Aortic atherosclerosis. Electronically Signed   By: Lajean Manes M.D.   On: 11/08/2017 17:54   Ct Abdomen Pelvis W Contrast  Result Date: 11/08/2017 CLINICAL DATA:  Pt arrived via EMS from home d/t vomiting and decrease mental  status noticed by care takers. EMS reports pt is nonverbal and contracted at baseline. EMS reports pt has a fractured left arm and multiple ulcers on her legs and back. EXAM: CT CHEST, ABDOMEN, AND PELVIS WITH CONTRAST TECHNIQUE: Multidetector CT imaging of the chest, abdomen and pelvis was performed following the standard protocol during bolus administration of intravenous contrast. CONTRAST:  68m ISOVUE-300 IOPAMIDOL (ISOVUE-300) INJECTION 61% COMPARISON:  Chest CT, 12/16/2015 FINDINGS: CT CHEST FINDINGS Cardiovascular: Heart is mildly enlarged. Small amount of pericardial fluid. Three-vessel coronary artery calcifications. Aorta is normal in caliber. No dissection. There is mild atherosclerosis along the arch and descending thoracic aorta. Main pulmonary artery is mildly dilated to 3.1 cm. Mild dilation of the right pulmonary artery, 2.9 cm. Prominent left pulmonary artery, 2.7 cm. Mediastinum/Nodes: Thyroid normal in size. No neck base or axillary masses or enlarged lymph nodes. No mediastinal or hilar masses or adenopathy. Trachea is patent. Esophagus is unremarkable. Lungs/Pleura: Reticular and mild hazy intervening opacities are noted in the mid to lower  right upper lobe and right lower lobe, and to a lesser degree along the posterior right middle lobe. These opacities were present to a similar degree on the prior chest CT. This may reflect atelectasis/scarring. Infection is possible but less likely. Left lung is clear. No evidence of pulmonary edema. No pleural effusion or pneumothorax. CT ABDOMEN PELVIS FINDINGS Hepatobiliary: No focal liver abnormality is seen. No gallstones, gallbladder wall thickening, or biliary dilatation. Pancreas: Unremarkable. No pancreatic ductal dilatation or surrounding inflammatory changes. Spleen: Spleen is borderline enlarged measuring 12.2 cm. There is a 16 mm low-density lesion in the spleen that is decreased in size from prior CT where it measured 3.7 cm. No other splenic  masses or lesions. Adrenals/Urinary Tract: No adrenal masses. Left greater than right renal cortical thinning with more focal areas of scarring in the left mid to upper pole. Two tiny nonobstructing stones in the left kidney at the midpole. No renal masses. No hydronephrosis. Ureters are difficult to follow, but are nondilated with no convincing stones. Bladder is unremarkable. Stomach/Bowel: Rectum is markedly distended with stool. Significant increased stool extends into the lower sigmoid colon which is dilated 9 cm. Stomach is unremarkable. No small bowel dilation or wall thickening. No convincing colonic wall thickening or inflammation. Evaluation of bowel is limited by the minimal peritoneal fat and lack of contrast. No appendix visualized. Vascular/Lymphatic: 19 mm enlarged right inguinal lymph node. No other adenopathy. Atherosclerotic changes are noted throughout a normal caliber abdominal aorta. Reproductive: Uterus not visualize, it appears to be surgically absent. No adnexal masses. Other: The abdominal wall bulges to the right, but the fascia appears intact. No convincing hernia. No ascites. MUSCULOSKELETAL FINDINGS Chronic displaced fracture of the left proximal humerus. No convincing acute fracture. No osteoblastic or osteolytic lesions. IMPRESSION: 1. Right lung shows mild reticular intervening hazy type opacities, with associated volume loss, similar to the prior CT. These findings are most likely chronic due to chronic atelectasis/scarring. Infection is possible but less likely. 2. Mild cardiomegaly.  Small pericardial effusion. 3. Mild enlargement of the main and right pulmonary arteries and prominence of the left pulmonary artery. Consider pulmonary hypertension. 4. Aortic atherosclerosis. Marked distention of the rectum with stool with significant dilation of the sigmoid colon with stool. Findings are consistent with a rectal impaction. No evidence of bowel obstruction and no convincing bowel  inflammation. 5. Single apparent enlarged right inguinal lymph node measuring 19 mm in short axis. 6. Cystic lesion in the spleen significantly decreased in size from the prior CT. 7. Aortic atherosclerosis. Electronically Signed   By: Lajean Manes M.D.   On: 11/08/2017 17:54   Dg Chest Portable 1 View  Result Date: 11/08/2017 CLINICAL DATA:  Pt arrived via EMS from home d/t vomiting and decrease mental status noticed by care takers. EMS reports pt is nonverbal and contracted at baseline. EMS reports pt has a fractured left arm and multiple ulcers on her legs and back. Hx - HTN, non-smoker. EXAM: PORTABLE CHEST 1 VIEW COMPARISON:  10/13/2016 FINDINGS: Cardiac silhouette is mildly enlarged. No mediastinal or hilar masses. There are prominent bronchovascular markings. Lungs are otherwise clear. No convincing pleural effusion. No pneumothorax. Bony thorax is demineralized. There is a chronic displaced fracture of the proximal left humerus stable from the prior study. IMPRESSION: No acute cardiopulmonary disease. Electronically Signed   By: Lajean Manes M.D.   On: 11/08/2017 16:39    Assessment:   AWILDA COVIN is a 84 y.o. female with PD, dementia, largely bedbound,  non verbal, with contractures, has known sacral wounds followed at [facility] admit with nausea vomiting and  fevers and leukocytosis.  Her sacral wound does not appear acutely infected but is obviously a possible source.  I suspect aspiration as well since per daughter wound has been stable, had no fever morning of admission and the fevers started after vomiting. Since admit [facility] + 1/2 CNS. UA only 0-5 wbc,  UCX neg. Flu PCR neg, CT abd shows fecal impaction. CT chest shows chronic changes.  Her BCX CNS is likely contaminant.  Prior wound cxs in Oct 2018 with MSSA, moranella and enterococcus. In Aug  Had MSSA, E coli and Proteus.  No further fevers and wbc down to 7. Currently on vanco and zosyn  Recommendations Cont vanco and zosyn for now I  have cultured her wound but no need to keep her her for result as will take several days. If no fevers tomorrow would dc on oral augmentin for a 10 day total course. Can continue to follow with wound care center.  I can fu on the wound culture but no particular need for her to see me as otpt since transportation is an issue.  Discussed extensively with her daughter at bedside Thank you very much for allowing me to participate in the care of this patient. Please call with questions.   Cheral Marker. Ola Spurr, MD

## 2017-11-10 NOTE — Progress Notes (Signed)
[employer] [facility] [place] at [facility]   PATIENT NAME: [name]    MR#:  [id number]  DATE OF BIRTH:  18-Jun-1923  SUBJECTIVE:  CHIEF COMPLAINT:   Chief Complaint  Patient presents with  . Emesis   Came with vomiting and weakness for 1 day. Patient is bedbound since many years and has sacral decubital ulcer. No source of infection was found so far. On broad-spectrum antibiotics. Her daughter is in the room to give me more details.  She is more alert and stronger today, no more vomiting and tolerating oral diet. Still confused at baseline dementia.  REVIEW OF SYSTEMS:    Dementia, not able to give much details.  ROS  DRUG ALLERGIES:   Allergies  Allergen Reactions  . Phenergan [Promethazine Hcl] Other (See Comments)    Family states the patient almost went into a coma.  . Sulfa Antibiotics Other (See Comments)    Unknown reaction  . Sulfasalazine Other (See Comments)    Unknown reaction    VITALS:  Blood pressure (!) 143/55, pulse 72, temperature 98.5 F (36.9 C), temperature source Axillary, resp. rate 18, height 4\' 9"  (1.448 m), weight 38.6 kg (85 lb), SpO2 96 %.  PHYSICAL EXAMINATION:   GENERAL:  84 y.o.-year-old patient lying in the bed with no acute distress.  Severe malnutrition. EYES: Pupils equal, round, reactive to light and accommodation. No scleral icterus. Extraocular muscles intact.  HEENT: Head atraumatic, normocephalic. Oropharynx and nasopharynx clear.  NECK:  Supple, no jugular venous distention. No thyroid enlargement, no tenderness.  LUNGS: Normal breath sounds bilaterally, no wheezing, rales,rhonchi or crepitation. No use of accessory muscles of respiration.  CARDIOVASCULAR: S1, S2 tachycardia. No murmurs, rubs, or gallops.  ABDOMEN: Soft, nontender, nondistended. Bowel sounds present. No organomegaly or mass.  EXTREMITIES: No cyanosis, or clubbing.  No leg edema.  Both arms, hands and legs contracted.  Multiple healing  ulcers. NEUROLOGIC: Unable to exam. Chronic atrophic changes on her limbs, and nonverbal. PSYCHIATRIC: The patient is demented and nonverbal. SKIN: Multiple healing ulcers. sacral decubetus ulcer.   Physical Exam LABORATORY PANEL:   CBC Recent Labs  Lab 11/10/17 0452  WBC 7.4  HGB 9.2*  HCT 27.6*  PLT 196   ------------------------------------------------------------------------------------------------------------------  Chemistries  Recent Labs  Lab 11/08/17 1602  11/10/17 0452  NA 138   < > 140  K 3.7   < > 3.9  CL 105   < > 112*  CO2 23   < > 22  GLUCOSE 154*   < > 107*  BUN 19   < > 21*  CREATININE 0.40*   < > 0.54  CALCIUM 9.3   < > 9.0  AST 27  --   --   ALT 11*  --   --   ALKPHOS 66  --   --   BILITOT 0.9  --   --    < > = values in this interval not displayed.   ------------------------------------------------------------------------------------------------------------------  Cardiac Enzymes Recent Labs  Lab 11/08/17 1602  TROPONINI <0.03   ------------------------------------------------------------------------------------------------------------------  RADIOLOGY:  Ct Chest W Contrast  Result Date: 11/08/2017 CLINICAL DATA:  Pt arrived via EMS from home d/t vomiting and decrease mental status noticed by care takers. EMS reports pt is nonverbal and contracted at baseline. EMS reports pt has a fractured left arm and multiple ulcers on her legs and back. EXAM: CT CHEST, ABDOMEN, AND PELVIS WITH CONTRAST TECHNIQUE: Multidetector CT imaging of the chest, abdomen and pelvis was performed  following the standard protocol during bolus administration of intravenous contrast. CONTRAST:  60mL ISOVUE-300 IOPAMIDOL (ISOVUE-300) INJECTION 61% COMPARISON:  Chest CT, 12/16/2015 FINDINGS: CT CHEST FINDINGS Cardiovascular: Heart is mildly enlarged. Small amount of pericardial fluid. Three-vessel coronary artery calcifications. Aorta is normal in caliber. No dissection. There  is mild atherosclerosis along the arch and descending thoracic aorta. Main pulmonary artery is mildly dilated to 3.1 cm. Mild dilation of the right pulmonary artery, 2.9 cm. Prominent left pulmonary artery, 2.7 cm. Mediastinum/Nodes: Thyroid normal in size. No neck base or axillary masses or enlarged lymph nodes. No mediastinal or hilar masses or adenopathy. Trachea is patent. Esophagus is unremarkable. Lungs/Pleura: Reticular and mild hazy intervening opacities are noted in the mid to lower right upper lobe and right lower lobe, and to a lesser degree along the posterior right middle lobe. These opacities were present to a similar degree on the prior chest CT. This may reflect atelectasis/scarring. Infection is possible but less likely. Left lung is clear. No evidence of pulmonary edema. No pleural effusion or pneumothorax. CT ABDOMEN PELVIS FINDINGS Hepatobiliary: No focal liver abnormality is seen. No gallstones, gallbladder wall thickening, or biliary dilatation. Pancreas: Unremarkable. No pancreatic ductal dilatation or surrounding inflammatory changes. Spleen: Spleen is borderline enlarged measuring 12.2 cm. There is a 16 mm low-density lesion in the spleen that is decreased in size from prior CT where it measured 3.7 cm. No other splenic masses or lesions. Adrenals/Urinary Tract: No adrenal masses. Left greater than right renal cortical thinning with more focal areas of scarring in the left mid to upper pole. Two tiny nonobstructing stones in the left kidney at the midpole. No renal masses. No hydronephrosis. Ureters are difficult to follow, but are nondilated with no convincing stones. Bladder is unremarkable. Stomach/Bowel: Rectum is markedly distended with stool. Significant increased stool extends into the lower sigmoid colon which is dilated 9 cm. Stomach is unremarkable. No small bowel dilation or wall thickening. No convincing colonic wall thickening or inflammation. Evaluation of bowel is limited by  the minimal peritoneal fat and lack of contrast. No appendix visualized. Vascular/Lymphatic: 19 mm enlarged right inguinal lymph node. No other adenopathy. Atherosclerotic changes are noted throughout a normal caliber abdominal aorta. Reproductive: Uterus not visualize, it appears to be surgically absent. No adnexal masses. Other: The abdominal wall bulges to the right, but the fascia appears intact. No convincing hernia. No ascites. MUSCULOSKELETAL FINDINGS Chronic displaced fracture of the left proximal humerus. No convincing acute fracture. No osteoblastic or osteolytic lesions. IMPRESSION: 1. Right lung shows mild reticular intervening hazy type opacities, with associated volume loss, similar to the prior CT. These findings are most likely chronic due to chronic atelectasis/scarring. Infection is possible but less likely. 2. Mild cardiomegaly.  Small pericardial effusion. 3. Mild enlargement of the main and right pulmonary arteries and prominence of the left pulmonary artery. Consider pulmonary hypertension. 4. Aortic atherosclerosis. Marked distention of the rectum with stool with significant dilation of the sigmoid colon with stool. Findings are consistent with a rectal impaction. No evidence of bowel obstruction and no convincing bowel inflammation. 5. Single apparent enlarged right inguinal lymph node measuring 19 mm in short axis. 6. Cystic lesion in the spleen significantly decreased in size from the prior CT. 7. Aortic atherosclerosis. Electronically Signed   By: Amie Portland M.D.   On: 11/08/2017 17:54   Ct Abdomen Pelvis W Contrast  Result Date: 11/08/2017 CLINICAL DATA:  Pt arrived via EMS from home d/t vomiting and decrease mental status  noticed by care takers. EMS reports pt is nonverbal and contracted at baseline. EMS reports pt has a fractured left arm and multiple ulcers on her legs and back. EXAM: CT CHEST, ABDOMEN, AND PELVIS WITH CONTRAST TECHNIQUE: Multidetector CT imaging of the chest,  abdomen and pelvis was performed following the standard protocol during bolus administration of intravenous contrast. CONTRAST:  60mL ISOVUE-300 IOPAMIDOL (ISOVUE-300) INJECTION 61% COMPARISON:  Chest CT, 12/16/2015 FINDINGS: CT CHEST FINDINGS Cardiovascular: Heart is mildly enlarged. Small amount of pericardial fluid. Three-vessel coronary artery calcifications. Aorta is normal in caliber. No dissection. There is mild atherosclerosis along the arch and descending thoracic aorta. Main pulmonary artery is mildly dilated to 3.1 cm. Mild dilation of the right pulmonary artery, 2.9 cm. Prominent left pulmonary artery, 2.7 cm. Mediastinum/Nodes: Thyroid normal in size. No neck base or axillary masses or enlarged lymph nodes. No mediastinal or hilar masses or adenopathy. Trachea is patent. Esophagus is unremarkable. Lungs/Pleura: Reticular and mild hazy intervening opacities are noted in the mid to lower right upper lobe and right lower lobe, and to a lesser degree along the posterior right middle lobe. These opacities were present to a similar degree on the prior chest CT. This may reflect atelectasis/scarring. Infection is possible but less likely. Left lung is clear. No evidence of pulmonary edema. No pleural effusion or pneumothorax. CT ABDOMEN PELVIS FINDINGS Hepatobiliary: No focal liver abnormality is seen. No gallstones, gallbladder wall thickening, or biliary dilatation. Pancreas: Unremarkable. No pancreatic ductal dilatation or surrounding inflammatory changes. Spleen: Spleen is borderline enlarged measuring 12.2 cm. There is a 16 mm low-density lesion in the spleen that is decreased in size from prior CT where it measured 3.7 cm. No other splenic masses or lesions. Adrenals/Urinary Tract: No adrenal masses. Left greater than right renal cortical thinning with more focal areas of scarring in the left mid to upper pole. Two tiny nonobstructing stones in the left kidney at the midpole. No renal masses. No  hydronephrosis. Ureters are difficult to follow, but are nondilated with no convincing stones. Bladder is unremarkable. Stomach/Bowel: Rectum is markedly distended with stool. Significant increased stool extends into the lower sigmoid colon which is dilated 9 cm. Stomach is unremarkable. No small bowel dilation or wall thickening. No convincing colonic wall thickening or inflammation. Evaluation of bowel is limited by the minimal peritoneal fat and lack of contrast. No appendix visualized. Vascular/Lymphatic: 19 mm enlarged right inguinal lymph node. No other adenopathy. Atherosclerotic changes are noted throughout a normal caliber abdominal aorta. Reproductive: Uterus not visualize, it appears to be surgically absent. No adnexal masses. Other: The abdominal wall bulges to the right, but the fascia appears intact. No convincing hernia. No ascites. MUSCULOSKELETAL FINDINGS Chronic displaced fracture of the left proximal humerus. No convincing acute fracture. No osteoblastic or osteolytic lesions. IMPRESSION: 1. Right lung shows mild reticular intervening hazy type opacities, with associated volume loss, similar to the prior CT. These findings are most likely chronic due to chronic atelectasis/scarring. Infection is possible but less likely. 2. Mild cardiomegaly.  Small pericardial effusion. 3. Mild enlargement of the main and right pulmonary arteries and prominence of the left pulmonary artery. Consider pulmonary hypertension. 4. Aortic atherosclerosis. Marked distention of the rectum with stool with significant dilation of the sigmoid colon with stool. Findings are consistent with a rectal impaction. No evidence of bowel obstruction and no convincing bowel inflammation. 5. Single apparent enlarged right inguinal lymph node measuring 19 mm in short axis. 6. Cystic lesion in the spleen significantly decreased  in size from the prior CT. 7. Aortic atherosclerosis. Electronically Signed   By: Amie Portland M.D.   On:  11/08/2017 17:54   Dg Chest Portable 1 View  Result Date: 11/08/2017 CLINICAL DATA:  Pt arrived via EMS from home d/t vomiting and decrease mental status noticed by care takers. EMS reports pt is nonverbal and contracted at baseline. EMS reports pt has a fractured left arm and multiple ulcers on her legs and back. Hx - HTN, non-smoker. EXAM: PORTABLE CHEST 1 VIEW COMPARISON:  10/13/2016 FINDINGS: Cardiac silhouette is mildly enlarged. No mediastinal or hilar masses. There are prominent bronchovascular markings. Lungs are otherwise clear. No convincing pleural effusion. No pneumothorax. Bony thorax is demineralized. There is a chronic displaced fracture of the proximal left humerus stable from the prior study. IMPRESSION: No acute cardiopulmonary disease. Electronically Signed   By: Amie Portland M.D.   On: 11/08/2017 16:39    ASSESSMENT AND PLAN:   Active Problems:   Sepsis (HCC)   Pressure injury of skin   Protein-calorie malnutrition, severe  * Sepsis, and no clear source. Continue vancomycin and Zosyn, follow-up CBC and cultures. IV fluids support.   Appreciated Wound care nurse help. As per her, Wound does not look infected.  May be she had viral gastritis. Cont to monitor.   One of the blood culture is positive for MRSA, likely contamination.   I sent repeat blood culture. As she had multiple wound infections in the past, I called ID consult.  * Hypertension.  Continue Toprol.  Hold if blood pressure is low. * Dementia.  Continue home dementia medications, aspiration the fall precaution. * Anemia of chronic disease.  Stable. * Severe malnutrition.  Dietitian consult.   All the records are reviewed and case discussed with Care Management/Social Workerr. Management plans discussed with the patient, family and they are in agreement.  CODE STATUS: DNR  TOTAL TIME TAKING CARE OF THIS PATIENT: 35 minutes.   POSSIBLE D/C IN 1-2 DAYS, DEPENDING ON CLINICAL  CONDITION.   Altamese Dilling M.D on 11/10/2017   Between 7am to 6pm - Pager - [phone]  After 6pm go to [email or web address] - password [email or web address]  [facility]  Office  [phone]  CC: Primary care physician; Steele Sizer, MD  Note: This dictation was prepared with Dragon dictation along with smaller phrase technology. Any transcriptional errors that result from this process are unintentional.

## 2017-11-10 NOTE — Care Management Important Message (Signed)
Important Message  Patient Details  Name: Elmarie Mainlandttalene W Principato MRN: [id number] Date of Birth: 1923/09/04   Medicare Important Message Given:  Yes    Gwenette GreetBrenda S Trebor Galdamez, RN 11/10/2017, 7:09 AM

## 2017-11-10 NOTE — Progress Notes (Signed)
PHARMACY - PHYSICIAN COMMUNICATION CRITICAL VALUE ALERT - BLOOD CULTURE IDENTIFICATION (BCID)  Results for orders placed or performed during the hospital encounter of 11/08/17  Blood Culture ID Panel (Reflexed) (Collected: 11/08/2017  4:03 PM)  Result Value Ref Range   Enterococcus species NOT DETECTED NOT DETECTED   Vancomycin resistance NOT DETECTED NOT DETECTED   Listeria monocytogenes NOT DETECTED NOT DETECTED   Staphylococcus species NOT DETECTED NOT DETECTED   Staphylococcus aureus NOT DETECTED NOT DETECTED   Methicillin resistance NOT DETECTED NOT DETECTED   Streptococcus species NOT DETECTED NOT DETECTED   Streptococcus agalactiae NOT DETECTED NOT DETECTED   Streptococcus pneumoniae NOT DETECTED NOT DETECTED   Streptococcus pyogenes NOT DETECTED NOT DETECTED   Acinetobacter baumannii NOT DETECTED NOT DETECTED   Enterobacteriaceae species NOT DETECTED NOT DETECTED   Enterobacter cloacae complex NOT DETECTED NOT DETECTED   Escherichia coli NOT DETECTED NOT DETECTED   Klebsiella oxytoca NOT DETECTED NOT DETECTED   Klebsiella pneumoniae NOT DETECTED NOT DETECTED   Proteus species NOT DETECTED NOT DETECTED   Serratia marcescens NOT DETECTED NOT DETECTED   Carbapenem resistance NOT DETECTED NOT DETECTED   Haemophilus influenzae NOT DETECTED NOT DETECTED   Neisseria meningitidis NOT DETECTED NOT DETECTED   Pseudomonas aeruginosa NOT DETECTED NOT DETECTED   Candida albicans NOT DETECTED NOT DETECTED   Candida glabrata NOT DETECTED NOT DETECTED   Candida krusei NOT DETECTED NOT DETECTED   Candida parapsilosis NOT DETECTED NOT DETECTED   Candida tropicalis NOT DETECTED NOT DETECTED    Name of physician (or Provider) Contacted: [facility]   Changes to prescribed antibiotics required:  No, continue pt on vancomycin.   Davon Abdelaziz D 11/10/2017  9:46 PM

## 2017-11-11 LAB — CBC
HCT: 28.6 % — ABNORMAL LOW (ref 35.0–47.0)
Hemoglobin: 9.9 g/dL — ABNORMAL LOW (ref 12.0–16.0)
MCH: 32.5 pg (ref 26.0–34.0)
MCHC: 34.6 g/dL (ref 32.0–36.0)
MCV: 93.9 fL (ref 80.0–100.0)
Platelets: 223 10*3/uL (ref 150–440)
RBC: 3.05 MIL/uL — ABNORMAL LOW (ref 3.80–5.20)
RDW: 13.4 % (ref 11.5–14.5)
WBC: 7.1 10*3/uL (ref 3.6–11.0)

## 2017-11-11 LAB — BASIC METABOLIC PANEL
Anion gap: 9 (ref 5–15)
BUN: 21 mg/dL — ABNORMAL HIGH (ref 6–20)
CO2: 22 mmol/L (ref 22–32)
Calcium: 8.7 mg/dL — ABNORMAL LOW (ref 8.9–10.3)
Chloride: 111 mmol/L (ref 101–111)
Creatinine, Ser: 0.44 mg/dL (ref 0.44–1.00)
GFR calc Af Amer: >60 mL/min (ref >60)
GFR calc non Af Amer: >60 mL/min (ref >60)
Glucose, Bld: 113 mg/dL — ABNORMAL HIGH (ref 65–99)
Potassium: 4 mmol/L (ref 3.5–5.1)
Sodium: 142 mmol/L (ref 135–145)

## 2017-11-11 LAB — VANCOMYCIN, TROUGH: Vancomycin Tr: 51 ug/mL (ref 15–20)

## 2017-11-11 MED ORDER — AMOXICILLIN-POT CLAVULANATE 600-42.9 MG/5ML PO SUSR: 600.0000 mg | Freq: Two times a day (BID) | ORAL | 0 refills | Status: DC

## 2017-11-11 MED ORDER — BISACODYL 5 MG PO TBEC: 5.0000 mg | DELAYED_RELEASE_TABLET | Freq: Every day | ORAL | 0 refills | Status: DC | PRN

## 2017-11-11 MED ORDER — AMOXICILLIN-POT CLAVULANATE 600-42.9 MG/5ML PO SUSR: 500.0000 mg | Freq: Two times a day (BID) | ORAL | 0 refills | Status: AC

## 2017-11-11 MED ORDER — NYSTATIN 100000 UNIT/ML MT SUSP: 5.0000 mL | Freq: Four times a day (QID) | OROMUCOSAL | 0 refills | Status: DC

## 2017-11-11 MED ORDER — METOPROLOL SUCCINATE ER 25 MG PO TB24: 25.0000 mg | ORAL_TABLET | Freq: Every day | ORAL | 0 refills | Status: DC

## 2017-11-11 NOTE — Progress Notes (Signed)
Pt is going back home w/her daughter.  She has 24 hour care.  Will d/c on augmentin 500mg  and dulcolax.  Pt has had mushy stools all night and today but abd is still distended.  She will go home w/dulcolax.  Changed sacral wound dressing - not source of her infection.  She became more awake this afternoon.  No vomiting and pt running low grade temp. She will transport home with her daughter and caregiver.

## 2017-11-11 NOTE — Consult Note (Signed)
Pharmacy Antibiotic Note  [name] is a 84 y.o. female admitted on 11/08/2017 with sepsis.  Pharmacy has been consulted for Zosyn and Vancomycin dosing. Patient received vancomycin 500mg  IV x 1 dose and Zosyn 3.375 IV x 1 dose in ED.   Plan: Ke: 0.029   VD: 26.6   T1/2: 23.9  Start Vancomycin 500mg  IV every 24 hours with  9 hour stack dosing. Calculated trough at Css is 16.4. Trough level ordered prior to 4th dose. Will monitor renal function and adjust dose as needed.   02/13 @ 0700 VT 51 supratherapeutic, drawn an hour after dose given. Renal function stable Scr 0.54 >> 0.44. Will continue current regimen and recheck VT 02/14 w/ am labs.  Start Zosyn 3.375 IV EI every 8 hours.    Height: 4\' 9"  (144.8 cm) Weight: 85 lb (38.6 kg) IBW/kg (Calculated) : 38.6  Temp (24hrs), Avg:98.9 F (37.2 C), Min:98.5 F (36.9 C), Max:99.2 F (37.3 C)  Recent Labs  Lab 11/08/17 1602 11/08/17 1603 11/08/17 2127 11/09/17 0347 11/10/17 0452 11/11/17 0620  WBC 15.1*  --   --  18.1* 7.4  --   CREATININE 0.40*  --   --  0.43* 0.54 0.44  LATICACIDVEN  --  1.5 1.2  --   --   --   VANCOTROUGH  --   --   --   --   --  51*    Estimated Creatinine Clearance: 27.3 mL/min (by C-G formula based on SCr of 0.44 mg/dL).    Allergies  Allergen Reactions  . Phenergan [Promethazine Hcl] Other (See Comments)    Family states the patient almost went into a coma.  . Sulfa Antibiotics Other (See Comments)    Unknown reaction  . Sulfasalazine Other (See Comments)    Unknown reaction    Antimicrobials this admission: 2/10 Zosyn >>  2/10 Vancomycin >>   Dose adjustments this admission:  Microbiology results: 2/10  BCx: pending  2/10  UCx: pending   Thank you for allowing pharmacy to be a part of this patient's care.  Thomasene Rippleavid  Noni Stonesifer, PharmD, BCPS Clinical Pharmacist 11/11/2017 7:13 AM

## 2017-11-11 NOTE — Progress Notes (Signed)
ID brief note No fevers, BCX 1/2 with GPR, 1/2 with CNS. Suggests contaminant.   Rec Dc on oral augmentin for 10 days total Can fu with wound care center.

## 2017-11-11 NOTE — Discharge Instructions (Addendum)
Cont to follow with wound care team

## 2017-11-11 NOTE — Discharge Summary (Addendum)
[facility] [place] [facility]   PATIENT NAME: [name]    MR#:  [id number]  DATE OF BIRTH:  09/12/1923  DATE OF ADMISSION:  11/08/2017 ADMITTING PHYSICIAN: Shaune Pollack, MD  DATE OF DISCHARGE: 11/11/2017  PRIMARY CARE PHYSICIAN: Steele Sizer, MD    ADMISSION DIAGNOSIS:  Sepsis, due to unspecified organism (HCC) [A41.9] Non-intractable vomiting with nausea, unspecified vomiting type [R11.2]  DISCHARGE DIAGNOSIS:  Active Problems:   Sepsis (HCC)   Pressure injury of skin   Protein-calorie malnutrition, severe   SECONDARY DIAGNOSIS:   Past Medical History:  Diagnosis Date  . Anemia   . Arm fracture 03/2016   left proximal humerus  . Arthritis   . B12 deficiency   . Hypertension   . Mild dementia   . Parkinson disease (HCC)   . Tremor     HOSPITAL COURSE:   * Sepsis, and no clear source. Continue vancomycin and Zosyn, follow-up CBC and cultures. IV fluids support.   Appreciated Wound care nurse help. As per her, Wound does not look infected.  May be she had viral gastritis. Cont to monitor.   One of the blood culture is positive for MRSA, likely contamination.   I sent repeat blood culture. As she had multiple wound infections in the past, I called ID consult.   ID suggested to treat for 10 days oral Augmentin for possibility of having aspirations to cover for aspiration pneumonia.   Blood cultures are reviewed and it seems to be contamination.   Suggest patient to continue following with wound care team.  * Hypertension. Continue Toprol. Hold if blood pressure is low. * Dementia. Continue home dementia medications, aspiration the fall precaution. * Anemia of chronic disease. Stable. * Severe malnutrition. Dietitian consult.   DISCHARGE CONDITIONS:   Stable.  CONSULTS OBTAINED:  Treatment Team:  Mick Sell, MD  DRUG ALLERGIES:   Allergies  Allergen Reactions  . Phenergan [Promethazine Hcl] Other (See  Comments)    Family states the patient almost went into a coma.  . Sulfa Antibiotics Other (See Comments)    Unknown reaction  . Sulfasalazine Other (See Comments)    Unknown reaction    DISCHARGE MEDICATIONS:   Allergies as of 11/11/2017      Reactions   Phenergan [promethazine Hcl] Other (See Comments)   Family states the patient almost went into a coma.   Sulfa Antibiotics Other (See Comments)   Unknown reaction   Sulfasalazine Other (See Comments)   Unknown reaction      Medication List    STOP taking these medications   doxycycline 100 MG capsule Commonly known as:  VIBRAMYCIN   fluconazole 150 MG tablet Commonly known as:  DIFLUCAN   nitrofurantoin (macrocrystal-monohydrate) 100 MG capsule Commonly known as:  MACROBID   terbinafine 250 MG tablet Commonly known as:  LAMISIL     TAKE these medications   acetaminophen 325 MG tablet Commonly known as:  TYLENOL Take 650 mg by mouth 2 (two) times daily as needed.   amoxicillin-clavulanate 600-42.9 MG/5ML suspension Commonly known as:  AUGMENTIN ES-600 Take 4.2 mLs (500 mg total) by mouth 2 (two) times daily for 10 days.   bisacodyl 5 MG EC tablet Commonly known as:  DULCOLAX Take 1 tablet (5 mg total) by mouth daily as needed for moderate constipation.   CALCIUM-CARB 600 PO Take 600 mg by mouth 2 (two) times daily.   Carbidopa-Levodopa ER 25-100 MG tablet controlled release Commonly known as:  SINEMET CR Take 1 tablet by mouth 2 (two) times daily.   chlorhexidine 4 % external liquid Commonly known as:  HIBICLENS Apply topically daily as needed.   collagenase ointment Commonly known as:  [name] Apply 1 application topically daily.   D3-1000 PO Take 2 capsules by mouth daily.   docusate sodium 100 MG capsule Commonly known as:  COLACE Take 100 mg by mouth 2 (two) times daily.   metoprolol succinate 25 MG 24 hr tablet Commonly known as:  TOPROL-XL Take 1 tablet (25 mg total) by mouth daily. Start  taking on:  11/12/2017 What changed:    medication strength  how much to take   MULTIVITAMIN ADULT PO Take 1 tablet by mouth daily.   nystatin 100000 UNIT/ML suspension Commonly known as:  MYCOSTATIN Take 5 mLs (500,000 Units total) by mouth 4 (four) times daily.        DISCHARGE INSTRUCTIONS:    Follow with Wound care and home visiting team. Resume home health.  If you experience worsening of your admission symptoms, develop shortness of breath, life threatening emergency, suicidal or homicidal thoughts you must seek medical attention immediately by calling 911 or calling your MD immediately  if symptoms less severe.  You Must read complete instructions/literature along with all the possible adverse reactions/side effects for all the Medicines you take and that have been prescribed to you. Take any new Medicines after you have completely understood and accept all the possible adverse reactions/side effects.   Please note  You were cared for by a hospitalist during your hospital stay. If you have any questions about your discharge medications or the care you received while you were in the hospital after you are discharged, you can call the unit and asked to speak with the hospitalist on call if the hospitalist that took care of you is not available. Once you are discharged, your primary care physician will handle any further medical issues. Please note that NO REFILLS for any discharge medications will be authorized once you are discharged, as it is imperative that you return to your primary care physician (or establish a relationship with a primary care physician if you do not have one) for your aftercare needs so that they can reassess your need for medications and monitor your lab values.    Today   CHIEF COMPLAINT:   Chief Complaint  Patient presents with  . Emesis    HISTORY OF PRESENT ILLNESS:  [name]  is a 84 y.o. female with a known history of medical problems  as below.  The patient was sent from home to ED due to above chief complaints.  The patient has dementia and bedbound.  She has a history of bedsores on her sacral and medial right leg.  She was found fever 104 and vomiting once at home.  She has tachycardia tachypnea and leukocytosis in the ED.  Sepsis protocol is started by Dr. Juliette Alcide.  Chest x-ray did not show any infiltrate and the urinalysis did not show any UTI.  But the CAT scan of the abdomen show constipation.  She had had a bowel movement in the ED.   VITAL SIGNS:  Blood pressure (!) 160/71, pulse 78, temperature 99.1 F (37.3 C), temperature source Oral, resp. rate 18, height 4\' 9"  (1.448 m), weight 38.6 kg (85 lb), SpO2 100 %.  I/O:    Intake/Output Summary (Last 24 hours) at 11/11/2017 1518 Last data filed at 11/11/2017 1300 Gross per 24 hour  Intake 1617.5  ml  Output -  Net 1617.5 ml    PHYSICAL EXAMINATION:   GENERAL:84 y.o.-year-old patient lying in the bed with no acute distress.Severe malnutrition. EYES: Pupils equal, round, reactive to light and accommodation. No scleral icterus. Extraocular muscles intact.  HEENT: Head atraumatic, normocephalic. Oropharynx and nasopharynx clear.  NECK: Supple, no jugular venous distention. No thyroid enlargement, no tenderness.  LUNGS: Normal breath sounds bilaterally, no wheezing, rales,rhonchi or crepitation. No use of accessory muscles of respiration.  CARDIOVASCULAR: S1, S2tachycardia. No murmurs, rubs, or gallops.  ABDOMEN: Soft, nontender, nondistended. Bowel sounds present. No organomegaly or mass.  EXTREMITIES: No cyanosis, or clubbing.No leg edema. Both arms, handsand legscontracted. Multiple healing ulcers. NEUROLOGIC:Unable to exam. Chronic atrophic changes on her limbs, and nonverbal. PSYCHIATRIC: The patient isdemented and nonverbal. SKIN:Multiple healing ulcers.sacral decubetus ulcer.    DATA REVIEW:   CBC Recent Labs  Lab 11/11/17 0704  WBC 7.1   HGB 9.9*  HCT 28.6*  PLT 223    Chemistries  Recent Labs  Lab 11/08/17 1602  11/11/17 0620  NA 138   < > 142  K 3.7   < > 4.0  CL 105   < > 111  CO2 23   < > 22  GLUCOSE 154*   < > 113*  BUN 19   < > 21*  CREATININE 0.40*   < > 0.44  CALCIUM 9.3   < > 8.7*  AST 27  --   --   ALT 11*  --   --   ALKPHOS 66  --   --   BILITOT 0.9  --   --    < > = values in this interval not displayed.    Cardiac Enzymes Recent Labs  Lab 11/08/17 1602  TROPONINI <0.03    Microbiology Results  Results for orders placed or performed during the hospital encounter of 11/08/17  Urine culture     Status: None   Collection Time: 11/08/17  4:03 PM  Result Value Ref Range Status   Specimen Description   Final    URINE, CATHETERIZED Performed at [facility], [address], [place], [place] [address]    Special Requests   Final    NONE Performed at [facility], [address] [place], [place] [address]    Culture   Final    NO GROWTH Performed at [facility], [address] [place], [place] [address]    Report Status 11/09/2017 FINAL  Final  Culture, blood (routine x 2)     Status: None (Preliminary result)   Collection Time: 11/08/17  4:03 PM  Result Value Ref Range Status   Specimen Description BLOOD LT WRIST  Final   Special Requests   Final    BOTTLES DRAWN AEROBIC AND ANAEROBIC Blood Culture adequate volume   Culture  Setup Time   Final    GRAM POSITIVE RODS AEROBIC BOTTLE ONLY CRITICAL RESULT CALLED TO, READ BACK BY AND VERIFIED WITH: JASON ROBBINS @ 2124 ON 11/10/2017 BY CAF Performed at [facility], [address], [place], [place] [address]    Culture GRAM POSITIVE RODS  Final   Report Status PENDING  Incomplete  Culture, blood (routine x 2)     Status: Abnormal (Preliminary result)   Collection Time: 11/08/17  4:03 PM  Result Value Ref Range Status   Specimen Description   Final    BLOOD LT [facility] Performed at [facility], [address] [place],  [place] [address]    Special Requests   Final    BOTTLES DRAWN AEROBIC AND ANAEROBIC Blood Culture adequate volume Performed at [facility], [address] [place], [place] [address]    Culture  Setup Time   Final    Organism ID to follow GRAM POSITIVE COCCI AEROBIC BOTTLE ONLY CRITICAL RESULT CALLED TO, READ BACK BY AND VERIFIED WITH: JASON ROBBINS AT 1610 11/09/17. MSS Performed at [facility], [address] [place], [place] [address]    Culture (A)  Final    STAPHYLOCOCCUS SPECIES (COAGULASE NEGATIVE) THE SIGNIFICANCE OF ISOLATING THIS ORGANISM FROM A SINGLE SET OF BLOOD CULTURES WHEN MULTIPLE SETS ARE DRAWN IS UNCERTAIN. PLEASE NOTIFY THE MICROBIOLOGY DEPARTMENT WITHIN ONE WEEK IF SPECIATION AND SENSITIVITIES ARE REQUIRED. Performed at [facility], [address] [place], [place] [address]    Report Status PENDING  Incomplete  Blood Culture ID Panel (Reflexed)     Status: Abnormal   Collection Time: 11/08/17  4:03 PM  Result Value Ref Range Status   Enterococcus species NOT DETECTED NOT DETECTED Final   Vancomycin resistance NOT DETECTED NOT DETECTED Final   Listeria monocytogenes NOT DETECTED NOT DETECTED Final   Staphylococcus species DETECTED (A) NOT DETECTED Final    Comment: CRITICAL RESULT CALLED TO, READ BACK BY AND VERIFIED WITH: JASON ROBBINS AT 1610 11/09/17. MSS Methicillin (oxacillin) resistant coagulase negative staphylococcus. Possible blood culture contaminant (unless isolated from more than one blood culture draw or clinical case suggests pathogenicity). No antibiotic treatment is indicated for blood  culture contaminants.    Staphylococcus aureus NOT DETECTED NOT DETECTED Final   Methicillin resistance DETECTED (A) NOT DETECTED Final    Comment: CRITICAL RESULT CALLED TO, READ BACK BY AND VERIFIED WITH: JASON ROBBINS AT 1610 11/09/17. MSS    Streptococcus species NOT DETECTED NOT DETECTED Final    Streptococcus agalactiae NOT DETECTED NOT DETECTED Final   Streptococcus pneumoniae NOT DETECTED NOT DETECTED Final   Streptococcus pyogenes NOT DETECTED NOT DETECTED Final   Acinetobacter baumannii NOT DETECTED NOT DETECTED Final   Enterobacteriaceae species NOT DETECTED NOT DETECTED Final   Enterobacter cloacae complex NOT DETECTED NOT DETECTED Final   Escherichia coli NOT DETECTED NOT DETECTED Final   Klebsiella oxytoca NOT DETECTED NOT DETECTED Final   Klebsiella pneumoniae NOT DETECTED NOT DETECTED Final   Proteus species NOT DETECTED NOT DETECTED Final   Serratia marcescens NOT DETECTED NOT DETECTED Final   Carbapenem resistance NOT DETECTED NOT DETECTED Final   Haemophilus influenzae NOT DETECTED NOT DETECTED Final   Neisseria meningitidis NOT DETECTED NOT DETECTED Final   Pseudomonas aeruginosa NOT DETECTED NOT DETECTED Final   Candida albicans NOT DETECTED NOT DETECTED Final   Candida glabrata NOT DETECTED NOT DETECTED Final   Candida krusei NOT DETECTED NOT DETECTED Final   Candida parapsilosis NOT DETECTED NOT DETECTED Final   Candida tropicalis NOT DETECTED NOT DETECTED Final    Comment: Performed at [facility], [address] [place], [place] [address]  Blood Culture ID Panel (Reflexed)     Status: None   Collection Time: 11/08/17  4:03 PM  Result Value Ref Range Status   Enterococcus species NOT DETECTED NOT DETECTED Final   Vancomycin resistance NOT DETECTED NOT DETECTED Final   Listeria monocytogenes NOT DETECTED NOT DETECTED Final   Staphylococcus species NOT DETECTED NOT DETECTED Final   Staphylococcus aureus NOT DETECTED NOT DETECTED Final   Methicillin resistance NOT DETECTED NOT DETECTED Final   Streptococcus  species NOT DETECTED NOT DETECTED Final   Streptococcus agalactiae NOT DETECTED NOT DETECTED Final   Streptococcus pneumoniae NOT DETECTED NOT DETECTED Final   Streptococcus pyogenes NOT DETECTED NOT DETECTED Final   Acinetobacter baumannii NOT  DETECTED NOT DETECTED Final   Enterobacteriaceae species NOT DETECTED NOT DETECTED Final   Enterobacter cloacae complex NOT DETECTED NOT DETECTED Final   Escherichia coli NOT DETECTED NOT DETECTED Final   Klebsiella oxytoca NOT DETECTED NOT DETECTED Final   Klebsiella pneumoniae NOT DETECTED NOT DETECTED Final   Proteus species NOT DETECTED NOT DETECTED Final   Serratia marcescens NOT DETECTED NOT DETECTED Final   Carbapenem resistance NOT DETECTED NOT DETECTED Final   Haemophilus influenzae NOT DETECTED NOT DETECTED Final   Neisseria meningitidis NOT DETECTED NOT DETECTED Final   Pseudomonas aeruginosa NOT DETECTED NOT DETECTED Final   Candida albicans NOT DETECTED NOT DETECTED Final   Candida glabrata NOT DETECTED NOT DETECTED Final   Candida krusei NOT DETECTED NOT DETECTED Final   Candida parapsilosis NOT DETECTED NOT DETECTED Final   Candida tropicalis NOT DETECTED NOT DETECTED Final    Comment: Performed at [facility], [address] [place], [place] [address]  Culture, blood (single) w Reflex to ID Panel     Status: None (Preliminary result)   Collection Time: 11/10/17  7:41 AM  Result Value Ref Range Status   Specimen Description BLOOD LEFT HAND  Final   Special Requests   Final    BOTTLES DRAWN AEROBIC AND ANAEROBIC Blood Culture adequate volume   Culture   Final    NO GROWTH < 24 HOURS Performed at [facility], [address], [place], [place] [address]    Report Status PENDING  Incomplete  Aerobic Culture (superficial specimen)     Status: None (Preliminary result)   Collection Time: 11/10/17  3:29 PM  Result Value Ref Range Status   Specimen Description   Final    SACRAL Performed at [facility], [address] [place], [place] [address]    Special Requests   Final    Normal Performed at [facility], [address] [place], [place] [address]    Gram Stain   Final    FEW WBC PRESENT, PREDOMINANTLY PMN NO SQUAMOUS  EPITHELIAL CELLS SEEN RARE GRAM POSITIVE COCCI IN CLUSTERS    Culture   Final    CULTURE REINCUBATED FOR BETTER GROWTH Performed at [facility], [address] [place], [place] [address]    Report Status PENDING  Incomplete    RADIOLOGY:  No results found.  EKG:   Orders placed or performed during the hospital encounter of 11/08/17  . ED EKG  . ED EKG  . ED EKG 12-Lead  . ED EKG 12-Lead      Management plans discussed with the patient, family and they are in agreement.  CODE STATUS:     Code Status Orders  (From admission, onward)        Start     Ordered   11/08/17 2133  Do not attempt resuscitation (DNR)  Continuous    Question Answer Comment  In the event of cardiac or respiratory ARREST Do not call a "code blue"   In the event of cardiac or respiratory ARREST Do not perform Intubation, CPR, defibrillation or ACLS   In the event of cardiac or respiratory ARREST Use medication by any route, position, wound care, and other measures to relive pain and suffering. May use oxygen, suction  and manual treatment of airway obstruction as needed for comfort.      11/08/17 2132    Code Status History    Date Active Date Inactive Code Status Order ID Comments User Context   12/16/2015 19:46 12/24/2015 18:28 Full Code [id number]  Marguarite Arbour, MD Inpatient    Advance Directive Documentation     Most Recent Value  Type of Advance Directive  Living will, Healthcare Power of Attorney  Pre-existing out of facility DNR order (yellow form or pink MOST form)  No data  "MOST" Form in Place?  No data      TOTAL TIME TAKING CARE OF THIS PATIENT: 35 minutes.    Altamese Dilling M.D on 11/11/2017 at 3:18 PM  Between 7am to 6pm - Pager - [phone]  After 6pm go to [email or web address] - password [email or web address]  [facility]  Office  [phone]  CC: Primary care physician; Steele Sizer, MD   Note: This dictation was prepared with Dragon  dictation along with smaller phrase technology. Any transcriptional errors that result from this process are unintentional.

## 2017-11-12 LAB — CULTURE, BLOOD (ROUTINE X 2): Special Requests: ADEQUATE

## 2017-11-13 ENCOUNTER — Telehealth: Payer: Self-pay

## 2017-11-13 LAB — CULTURE, BLOOD (ROUTINE X 2): Special Requests: ADEQUATE

## 2017-11-13 NOTE — Telephone Encounter (Signed)
Transition Care Management Follow-up Telephone Call   Spoke with daughterLuster Landsberg- [name].   Date discharged?11/11/2017   How have you been since you were released from the hospital? "doing a little better, eating better and tolerating food. No fevers. Still a little abdominal tightness with no pain"   Do you understand why you were in the hospital? yes   Do you understand the discharge instructions? yes   Where were you discharged to? Home with home health.    Items Reviewed:  Medications reviewed: yes  Allergies reviewed: yes  Dietary changes reviewed: yes  Referrals reviewed: yes   Functional Questionnaire:   Activities of Daily Living (ADLs):   She states they are independent in the following: none States they require assistance with the following: ambulation, bathing and hygiene, feeding, continence, grooming, toileting and dressing   Any transportation issues/concerns?: no   Any patient concerns? Yes, concerned with bringing her into the office around flu season. Will keep appt on 11/25/2017 for now just in case she Is not better and will call and cancel if needed before 11/24/2017. Informed her to make sure she has a mask on if she ends up bringing her into the office on the 27th.   Confirmed importance and date/time of follow-up visits scheduled yes  Provider Appointment booked with [address] on 11/05/2017 at 1:30pm   Confirmed with patient if condition begins to worsen call PCP or go to the ER.  Patient was given the office number and encouraged to call back with question or concerns.  : yes

## 2017-11-15 LAB — CULTURE, BLOOD (SINGLE)
Culture: NO GROWTH
Special Requests: ADEQUATE

## 2017-11-15 LAB — AEROBIC CULTURE W GRAM STAIN (SUPERFICIAL SPECIMEN)

## 2017-11-15 LAB — AEROBIC CULTURE  (SUPERFICIAL SPECIMEN): Special Requests: NORMAL

## 2017-11-17 ENCOUNTER — Encounter: Payer: Medicare Other | Attending: Physician Assistant | Admitting: Physician Assistant

## 2017-11-17 DIAGNOSIS — Z882 Allergy status to sulfonamides status: Secondary | ICD-10-CM | POA: Insufficient documentation

## 2017-11-17 DIAGNOSIS — I10 Essential (primary) hypertension: Secondary | ICD-10-CM | POA: Insufficient documentation

## 2017-11-17 DIAGNOSIS — F0390 Unspecified dementia without behavioral disturbance: Secondary | ICD-10-CM | POA: Diagnosis not present

## 2017-11-17 DIAGNOSIS — G20 Parkinson's disease: Secondary | ICD-10-CM | POA: Diagnosis not present

## 2017-11-17 DIAGNOSIS — Z888 Allergy status to other drugs, medicaments and biological substances status: Secondary | ICD-10-CM | POA: Diagnosis not present

## 2017-11-17 DIAGNOSIS — L89153 Pressure ulcer of sacral region, stage 3: Secondary | ICD-10-CM | POA: Insufficient documentation

## 2017-11-17 DIAGNOSIS — L03312 Cellulitis of back [any part except buttock]: Secondary | ICD-10-CM | POA: Diagnosis not present

## 2017-11-17 DIAGNOSIS — I739 Peripheral vascular disease, unspecified: Secondary | ICD-10-CM | POA: Diagnosis not present

## 2017-11-18 NOTE — Progress Notes (Signed)
[name], [name] ([id number]) Visit Report for 11/17/2017 Chief Complaint Document Details Patient Name: [name], [name] Date of Service: 11/17/2017 10:00 AM Medical Record Number: [id number] Patient Account Number: [id number] Date of Birth/Sex: 07/31/1923 (84 y.o. Female) Treating RN: Ashok Cordia, Debi Primary Care Provider: Vonita Moss Other Clinician: Referring Provider: Vonita Moss Treating Provider/Extender: Linwood Dibbles, Daney Moor Weeks in Treatment: 51 Information Obtained from: Patient Chief Complaint patient is here for follow up evaluation of a sacral pressure ulcer Electronic Signature(s) Signed: 11/17/2017 5:27:42 PM By: Lenda Kelp PA-C Entered By: Lenda Kelp on 11/17/2017 10:06:02 [name] ([id number]) -------------------------------------------------------------------------------- HPI Details Patient Name: [name] Date of Service: 11/17/2017 10:00 AM Medical Record Number: [id number] Patient Account Number: [id number] Date of Birth/Sex: 10/11/1922 (84 y.o. Female) Treating RN: Ashok Cordia, Debi Primary Care Provider: Vonita Moss Other Clinician: Referring Provider: Vonita Moss Treating Provider/Extender: Linwood Dibbles, Brayan Votaw Weeks in Treatment: 44 History of Present Illness HPI Description: 01/07/17 this is a 84 year old woman admitted to the clinic today for review of a pressure ulcer on her lower sacrum. She is referred from her primary physician's office after being seen on 3/22 with a 3 cm pressure area. Her daughter and caretaker accompanied her today state that the area first became obvious about a month ago and his since deteriorated. They have recently got [name] a home health involved and have been using [name] to the wound. They have ordered a pressure relief surface for her mattress. They are turning her religiously. They state that she eats well and they've been forcing fluids on her. She is on a multivitamin. Looking through [facility]  point last albumin I see was 4.4 on 10/28. The patient has advanced parkinsonism which looks superficially like advanced Parkinson's disease although her daughter tells me she did not ever respond to Sinemet therefore this may have another pathology with signs of parkinsonism. However I think this is largely a mute point currently. She also has dementia and is nonambulatory. Since this started they have been keeping her in bed and turning her religiously every 2 hours. She lives at home in [place] with her husband with 24/7 care giving 01/13/17 santyl change qd. Still will require further debridement. continue santyl. 01/20/17; patient's wound actually looks some better less adherent necrotic surface. There is actually visible granulation. We're using [name] 01/27/17; better-looking surface but still a lot of necrotic tissue on the base of this wound. The periwound erythema is better than last week we are still using [name]. Her daughter tells [place] that she is still having trouble with the pressure-relief mattress through medical modalities 02/03/18; I had the patient scheduled for a two week followup however her daughter brought her in early concerned for discoloration on 2 areas of the wound circumference. We have bee using santyl 02/10/17;Better looking surface to the wound. Rim appears better suggesting better offloading. Using santyl 02/24/17; change to Silver Collegen last time. Wound appears better. 03/10/17; still using silver collagen religious offloading. Intake is satisfactory per her daughter. Dimension slightly better 03/12/2017 -- Dr. Jannetta Quint patient who had been seen 2 days ago and was doing fairly well. The patient is brought in by her daughter who noticed a new wound just above the previous wound on her sacral area and going on more to the left lateral side. She was very concerned and we asked her to get in for an opinion. 03/17/17; above is noted. The patient has developed a progressive  area to the left of her original wound. This  seems to this started with a ring of red skin with a more pale interior almost looking fungal. There was a rim of blister through part of the area although this did not look like zoster. They have been applying triamcinolone that was prescribed last week by Dr. Meyer Russel and the area has a fold to a linear band area which is confluent, erythematous and with obvious epidermal swelling but there is no overt tenderness or crepitus. She has lost some surface epithelium closer to the wound surface itself and now has a more superficial wound in this area and the extending erythema goes towards the left buttock. This is well demarcated between involved in normal skin but once again does not appear to be at all tender. If there is a contact issue here I cannot get the history out of the daughter or the caregiver that are with her. 03/24/17; the patient arrives today with the wound slightly worse slightly more drainage. The bandlike area of erythema that I treated as a possible pineal infection has improved somewhat although proximally is still has confluent erythema without overt tenderness. We have been using silver alginate since the most recent deterioration. To the bandlike degree of erythema we have been using Lotrisone cream 03/31/17; patient arrives today with the wound slightly larger, necrotic surface and surrounding erythema. The bandlike area of erythema that I treated as a possible pineal infection is less swollen but still present I've been using Lotrisone cream on that largely related to the presence of a tinea looking infection when this was first seen. We've been using silver alginate. X-ray that I ordered last week showed no acute bony abnormalities mild fecal impaction. Lab work showed a comprehensive metabolic panel that was normal including an albumin of 3.8. White count was 9.5 hemoglobin 12.3 differential count normal. C-reactive protein was less  than 1 and sedimentation rate and 17. The latter 2 values does not support an ongoing bacterial infection. 04/14/17; patient arrives after a 2 week hiatus. Her wound is not in good condition. Although the base of the wound looks DESTANE, SPEAS. ([id number]) stable she still has an erythematous area that was apparently blistered over the weekend. This again points to the left. As our intake nurse pointed out today this is in the area where the tissue folds together and we may need to prevent try to prevent this. Lab work and x-ray that I did to 3 weeks ago were unremarkable including her albumin nevertheless she is an extremely frail condition physically. We have have been using [name] 04/22/17; I changed her to silver alginate because of the surrounding maceration and moisture last week. The daughter did not like the way the wound looked in the middle of the week and changed her back to Esmond. They're putting gauze on top of this. Thinks still using Lotrisone. 05/06/17 on evaluation today patient sacral wound appears to be doing okay and does not seem to be any worse. She is having no significant pain during evaluation today the secondary to mental status she is unable to rate or describe whether she had any pain she was not however flinching. Her daughter states that the wound does appear to be looking better to her. Still we are having difficulty with the skinfold that seems to be closing in on itself at this point. All in all I feel like she is making some good progress in the [name] seems to be the official for her. They do tell me that a refill if  we are gonna continue that today. No fevers, chills, nausea, or vomiting noted at this time. 05/13/17 presents today for evaluation concerning her ongoing sacral pressure ulcer. Unfortunately she also has an area of deep tissue injury in the right Ischial region which is starting to show up. The sacral wound also continues to show signs of necrotic  tissue overlying and has declined. Overall we really have not seen a significant improvement in the past several months in regard to the sacral wound and now patient is starting to develop a new wound in the right Ischial region. Obviously this is not trending in the direction that we want to see. No fevers, chills, nausea, or vomiting noted at this time. 05/19/17; I have not seen this wound and almost a month however there is nothing really positive to say about it. Necrotic tissue over the surface which superiorly I think abuts on her sacrum. She has surrounding erythema. I would be surprised if there is not underlying osteomyelitis or soft tissue infection. 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 She apparently eats well per description although I wonder about this looking at her. Lab work I did probably 4 weeks ago however was really quite normal including a serum albumin 05/26/17; culture I did last week grew Escherichia coli and methicillin sensitive staph aureus which should've been well covered by the Augmentin and ciprofloxacin. Indeed the erythema around the wound in the bed of the wound looks somewhat better. Her intake is still satisfactory. They now have a near fluidized bed 06/02/17; they completed the antibiotics last Friday. Using collagen. Daughter still reports eating and drinking well. There is less visualized erythema around the wound. 06/16/17; large pressure ulcer over her lower sacrum and coccyx. Using [name] to the wound bed. 06/30/17; certainly no change in dimensions of this large stage III wound over her sacrum and coccyx. They've been using [name]. There is no exposed bone. She has a candidal/tinea area in the right inguinal area. Other than that her daughter relates that she is eating and drinking well there changing her positioning to make sure the areas offloaded 07/14/17; no major change in the dimensions of this large stage III wound. Initially a smaller wound  that became secondarily infected causing significant tissue breakdown although it is been stable in the last several weeks. Tunneling superiorly at roughly 1:00 no change here either. There is no bone palpable. Both the patient's daughter and caretaker states that she eats well. I have not rechecked her blood work 07/27/17; patient arrives in clinic today and generally a deteriorated looking state. Mild fever with axillary temperature of 100.4. Daughter reports she has not been eating and drinking well since yesterday. She looks more pale and thin and less responsive. We have been using silver collagen to her wound 08/11/17; since the last time the patient was here things have gone better. Her fever went down and she started eating and drinking again. Culture I did of the wound showed methicillin sensitive staph aureus, Morganella and enterococcus. I only treated her with Keflex which would've not covered the Morganella and enterococcus however the purulent area on the 2:00 side of her wound is a lot better and the bandlike erythema that concern me also was resolved. I'd called the daughter last week to confirm that she was a lot better. She finished the Keflex last Thursday she also suffered a skin tear this morning perhaps while putting on her incontinence brief period is on the lateral aspect of  her right leg. Clean wound with the surface epithelium not viable. 08/25/17; last week the patient was noted to have erythema around the wound margin and a slight fever which the patient's daughter says was 76. Our office was contacted by home health however we did not have a space to work the patient in that she went to see her primary physician Dr. Maurice March. She was not febrile during this visit on 08/21/17 there was erythema around the wound similar to last occasion. Dr. Maurice March in reference to my culture from 07/28/17 and put her on doxycycline capsules which they're opening twice a day for 10  days. 09/17/17; patient arrives today with the wound bed looking fairly well granulated. There is undermining from 4 to 6:00 although this seems to of contracted slightly. She does not have obvious infection although the daughter states there was some darkening of the wound circumference that is not evident today. They state she is eating well. They are concerned about oral thrush 09/30/16; very fibrin looking granulated wound bed. Her undermining from 4 to 6:00 is about the same but also appears to be well granulated. She has rolled edges of senescent tissue from roughly 7 to 12:00. There is no evidence of infection ERNESTA, TRABERT ([id number]) 10/13/17 on evaluation today patient appears to be doing fairly well all things considered in regard to her sacral wound. There's really not a lot of significant change or improvement she does have some evidence of contusion and deep tissue injury around the left border of the wound that patient's daughter did inquire about today. Nonetheless overall the wound appears to be doing about the same in my opinion. There is no significant indication of infection there also is no significant slough noted at this point. 10/27/17 She is here in follow up evaluation of a sacral ulcer. She is accompanied by her daughter and caregiver. There is red granulation tissue throughout, persistent discoloration to left border; this appears consistent with deep tissue injury. There are multiple areas covered in foam borders, tegaderm, etc that are "preventative" with "no wounds". We will continue with prisma and continue with two week follow ups 11/17/17 on evaluation today patient appears to be doing decently well in regard to her wound at this point. She continues to have a sacral wound ulcer. We see her roughly every two weeks. In the last week she was actually in the hospital due to what was diagnosed as sepsis although no organisms were ever identified in the actual blood  cultures. The hospital really was not sure that the wound was the cause of the infection but they really did not figure out anything else that would be a causative organism she did have a CT scan and that revealed no evidence of pneumonia there was also no evidence of urinary tract infection. Again it very well could have been the wound called in those although wound appears to be doing fairly well at this point. Electronic Signature(s) Signed: 11/17/2017 5:27:42 PM By: Lenda Kelp PA-C Entered By: Lenda Kelp on 11/17/2017 10:27:24 [name] ([id number]) -------------------------------------------------------------------------------- Physical Exam Details Patient Name: [name] Date of Service: 11/17/2017 10:00 AM Medical Record Number: [id number] Patient Account Number: [id number] Date of Birth/Sex: Jan 11, 1923 (84 y.o. Female) Treating RN: Ashok Cordia, Debi Primary Care Provider: Vonita Moss Other Clinician: Referring Provider: Vonita Moss Treating Provider/Extender: STONE III, Yanil Dawe Weeks in Treatment: 44 Constitutional Chronically ill appearing but in no apparent acute distress. Respiratory normal breathing without difficulty. clear to  auscultation bilaterally. Cardiovascular regular rate and rhythm with normal S1, S2. Psychiatric Patient is not able to cooperate in decision making regarding care. Patient has dementia. patient is confused. Notes Patient's wound bed actually show signs of good granulation the only thing that is really problematic is that she does have Epiboly noted at the 12 o'clock location as well is that the 6 o'clock location. This is worth of 12 o'clock. In general this seems to be due to the fact that she does have a lot of loose skin in the way this falls under is the main issue. When it stretched out there's not as much Epiboly noted. In general the wound appears to be overall about the same since I last evaluated her. Electronic  Signature(s) Signed: 11/17/2017 5:27:42 PM By: Lenda Kelp PA-C Entered By: Lenda Kelp on 11/17/2017 10:32:13 [name] ([id number]) -------------------------------------------------------------------------------- Physician Orders Details Patient Name: [name] Date of Service: 11/17/2017 10:00 AM Medical Record Number: [id number] Patient Account Number: [id number] Date of Birth/Sex: 03/03/1923 (84 y.o. Female) Treating RN: Ashok Cordia, Debi Primary Care Provider: Vonita Moss Other Clinician: Referring Provider: Vonita Moss Treating Provider/Extender: Linwood Dibbles, Odarius Dines Weeks in Treatment: 59 Verbal / Phone Orders: Yes Clinician: Ashok Cordia, Debi Read Back and Verified: Yes Diagnosis Coding ICD-10 Coding Code Description L89.153 Pressure ulcer of sacral region, stage 3 G20 Parkinson's disease L03.312 Cellulitis of back [any part except buttock] S81.811D Laceration without foreign body, right lower leg, subsequent encounter Wound Cleansing Wound #1 Midline Coccyx o Clean wound with Normal Saline. o May Shower, gently pat wound dry prior to applying new dressing. Anesthetic (add to Medication List) Wound #1 Midline Coccyx o Topical Lidocaine 4% cream applied to wound bed prior to debridement (In Clinic Only). Skin Barriers/Peri-Wound Care Wound #1 Midline Coccyx o Skin Prep Primary Wound Dressing Wound #1 Midline Coccyx o Prisma Ag - pack into undermining, may require 2 pieces of prisma ag for wound packing Secondary Dressing Wound #1 Midline Coccyx o Boardered Foam Dressing - HHRN PLEASE ORDER ALLEVYN LIFE BORDERED FOAM FOR PATIENT (OFF BRANDS IRRITATE HER SKIN) Dressing Change Frequency Wound #1 Midline Coccyx o Change dressing every day. Follow-up Appointments o Return Appointment in 2 weeks. Off-Loading Wound #1 Midline Coccyx o Roho cushion for wheelchair o Mattress - fluidized air mattress o Turn and reposition every 2  hours [name], BLEAU. ([id number]) Additional Orders / Instructions Wound #1 Midline Coccyx o Increase protein intake. - please add protein supplements to patients diet o Other: - please add vitamin A, vitamin C and zinc supplements to patients diet Home Health Wound #1 Midline Coccyx o Continue Home Health Visits - [facility] o Home Health Nurse may visit PRN to address patientos wound care needs. o FACE TO FACE ENCOUNTER: MEDICARE and MEDICAID PATIENTS: I certify that this patient is under my care and that I had a face-to-face encounter that meets the physician face-to-face encounter requirements with this patient on this date. The encounter with the patient was in whole or in part for the following MEDICAL CONDITION: (primary reason for Home Healthcare) MEDICAL NECESSITY: I certify, that based on my findings, NURSING services are a medically necessary home health service. HOME BOUND STATUS: I certify that my clinical findings support that this patient is homebound (i.e., Due to illness or injury, pt requires aid of supportive devices such as crutches, cane, wheelchairs, walkers, the use of special transportation or the assistance of another person to leave their place of residence. There is a normal  inability to leave the home and doing so requires considerable and taxing effort. Other absences are for medical reasons / religious services and are infrequent or of short duration when for other reasons). o If current dressing causes regression in wound condition, may D/C ordered dressing product/s and apply Normal Saline Moist Dressing daily until next Wound Healing Center / Other MD appointment. Notify Wound Healing Center of regression in wound condition at [phone]. o Please direct any NON-WOUND related issues/requests for orders to patient's Primary Care Physician - Dr Vonita Moss Patient Medications Allergies: Phenergan, Sulfa (Sulfonamide  Antibiotics) Notifications Medication Indication Start End lidocaine DOSE 1 - topical 4 % cream - 1 cream topical Electronic Signature(s) Signed: 11/17/2017 2:14:45 PM By: Renne Crigler Signed: 11/17/2017 5:27:42 PM By: Lenda Kelp PA-C Entered By: Renne Crigler on 11/17/2017 10:39:07 [name] ([id number]) -------------------------------------------------------------------------------- Prescription 11/17/2017 Patient Name: [name] Provider: Lenda Kelp PA-C Date of Birth: 10/11/1922 NPI#: [id number] Sex: F DEA#: [id number] Phone #: [phone] License #: Patient Address: [facility] [address] [facility] [place], [place] [address] [address], [address] [place], [place] [address] [phone] Allergies Phenergan Sulfa (Sulfonamide Antibiotics) Medication Medication: Route: Strength: Form: lidocaine 4 % topical cream topical 4% cream Class: TOPICAL LOCAL ANESTHETICS Dose: Frequency / Time: Indication: 1 1 cream topical Number of Refills: Number of Units: 0 Generic Substitution: Start Date: End Date: One Time Use: Substitution Permitted No Note to Pharmacy: Signature(s): Date(s): Electronic Signature(s) Signed: 11/17/2017 2:14:45 PM By: Renne Crigler Signed: 11/17/2017 5:27:42 PM By: Lenda Kelp PA-C Entered By: Renne Crigler on 11/17/2017 10:39:08 [name] ([id number]) [name] ([id number]) --------------------------------------------------------------------------------  Problem List Details Patient Name: [name] Date of Service: 11/17/2017 10:00 AM Medical Record Number: [id number] Patient Account Number: [id number] Date of Birth/Sex: Mar 11, 1923 (84 y.o. Female) Treating RN: Ashok Cordia, Debi Primary Care Provider: Vonita Moss Other Clinician: Referring Provider: Vonita Moss Treating Provider/Extender: Linwood Dibbles, Francely Craw Weeks in  Treatment: 27 Active Problems ICD-10 Encounter Code Description Active Date Diagnosis L89.153 Pressure ulcer of sacral region, stage 3 01/07/2017 Yes G20 Parkinson's disease 01/07/2017 Yes L03.312 Cellulitis of back [any part except buttock] 07/28/2017 Yes S81.811D Laceration without foreign body, right lower leg, subsequent 08/11/2017 Yes encounter Inactive Problems Resolved Problems Electronic Signature(s) Signed: 11/17/2017 10:53:19 AM By: Alejandro Mulling Signed: 11/17/2017 5:27:42 PM By: Lenda Kelp PA-C Entered By: Alejandro Mulling on 11/17/2017 10:53:18 [name] ([id number]) -------------------------------------------------------------------------------- Progress Note Details Patient Name: [name] Date of Service: 11/17/2017 10:00 AM Medical Record Number: [id number] Patient Account Number: [id number] Date of Birth/Sex: Oct 21, 1922 (84 y.o. Female) Treating RN: Ashok Cordia, Debi Primary Care Provider: Vonita Moss Other Clinician: Referring Provider: Vonita Moss Treating Provider/Extender: Linwood Dibbles, Geffrey Michaelsen Weeks in Treatment: 44 Subjective Chief Complaint Information obtained from Patient patient is here for follow up evaluation of a sacral pressure ulcer History of Present Illness (HPI) 01/07/17 this is a 84 year old woman admitted to the clinic today for review of a pressure ulcer on her lower sacrum. She is referred from her primary physician's office after being seen on 3/22 with a 3 cm pressure area. Her daughter and caretaker accompanied her today state that the area first became obvious about a month ago and his since deteriorated. They have recently got [name] a home health involved and have been using [name] to the wound. They have ordered a pressure relief surface for her mattress. They are turning her religiously. They state that  she eats well and they've been forcing fluids on her. She is on a multivitamin. Looking through [facility] point  last albumin I see was 4.4 on 10/28. The patient has advanced parkinsonism which looks superficially like advanced Parkinson's disease although her daughter tells me she did not ever respond to Sinemet therefore this may have another pathology with signs of parkinsonism. However I think this is largely a mute point currently. She also has dementia and is nonambulatory. Since this started they have been keeping her in bed and turning her religiously every 2 hours. She lives at home in [place] with her husband with 24/7 care giving 01/13/17 santyl change qd. Still will require further debridement. continue santyl. 01/20/17; patient's wound actually looks some better less adherent necrotic surface. There is actually visible granulation. We're using [name] 01/27/17; better-looking surface but still a lot of necrotic tissue on the base of this wound. The periwound erythema is better than last week we are still using [name]. Her daughter tells [place] that she is still having trouble with the pressure-relief mattress through medical modalities 02/03/18; I had the patient scheduled for a two week followup however her daughter brought her in early concerned for discoloration on 2 areas of the wound circumference. We have bee using santyl 02/10/17;Better looking surface to the wound. Rim appears better suggesting better offloading. Using santyl 02/24/17; change to Silver Collegen last time. Wound appears better. 03/10/17; still using silver collagen religious offloading. Intake is satisfactory per her daughter. Dimension slightly better 03/12/2017 -- Dr. Jannetta Quint patient who had been seen 2 days ago and was doing fairly well. The patient is brought in by her daughter who noticed a new wound just above the previous wound on her sacral area and going on more to the left lateral side. She was very concerned and we asked her to get in for an opinion. 03/17/17; above is noted. The patient has developed a progressive area  to the left of her original wound. This seems to this started with a ring of red skin with a more pale interior almost looking fungal. There was a rim of blister through part of the area although this did not look like zoster. They have been applying triamcinolone that was prescribed last week by Dr. Meyer Russel and the area has a fold to a linear band area which is confluent, erythematous and with obvious epidermal swelling but there is no overt tenderness or crepitus. She has lost some surface epithelium closer to the wound surface itself and now has a more superficial wound in this area and the extending erythema goes towards the left buttock. This is well demarcated between involved in normal skin but once again does not appear to be at all tender. If there is a contact issue here I cannot get the history out of the daughter or the caregiver that are with her. 03/24/17; the patient arrives today with the wound slightly worse slightly more drainage. The bandlike area of erythema that I treated as a possible pineal infection has improved somewhat although proximally is still has confluent erythema without overt tenderness. We have been using silver alginate since the most recent deterioration. To the bandlike degree of erythema we have been using Lotrisone cream 03/31/17; patient arrives today with the wound slightly larger, necrotic surface and surrounding erythema. The bandlike area of erythema that I treated as a possible pineal infection is less swollen but still present I've been using Lotrisone cream on that largely related to the presence of  a tinea looking infection when this was first seen. We've been using silver alginate. [name], [name] ([id number]) X-ray that I ordered last week showed no acute bony abnormalities mild fecal impaction. Lab work showed a comprehensive metabolic panel that was normal including an albumin of 3.8. White count was 9.5 hemoglobin 12.3 differential count  normal. C-reactive protein was less than 1 and sedimentation rate and 17. The latter 2 values does not support an ongoing bacterial infection. 04/14/17; patient arrives after a 2 week hiatus. Her wound is not in good condition. Although the base of the wound looks stable she still has an erythematous area that was apparently blistered over the weekend. This again points to the left. As our intake nurse pointed out today this is in the area where the tissue folds together and we may need to prevent try to prevent this. Lab work and x-ray that I did to 3 weeks ago were unremarkable including her albumin nevertheless she is an extremely frail condition physically. We have have been using [name] 04/22/17; I changed her to silver alginate because of the surrounding maceration and moisture last week. The daughter did not like the way the wound looked in the middle of the week and changed her back to Alpine. They're putting gauze on top of this. Thinks still using Lotrisone. 05/06/17 on evaluation today patient sacral wound appears to be doing okay and does not seem to be any worse. She is having no significant pain during evaluation today the secondary to mental status she is unable to rate or describe whether she had any pain she was not however flinching. Her daughter states that the wound does appear to be looking better to her. Still we are having difficulty with the skinfold that seems to be closing in on itself at this point. All in all I feel like she is making some good progress in the [name] seems to be the official for her. They do tell me that a refill if we are gonna continue that today. No fevers, chills, nausea, or vomiting noted at this time. 05/13/17 presents today for evaluation concerning her ongoing sacral pressure ulcer. Unfortunately she also has an area of deep tissue injury in the right Ischial region which is starting to show up. The sacral wound also continues to show signs  of necrotic tissue overlying and has declined. Overall we really have not seen a significant improvement in the past several months in regard to the sacral wound and now patient is starting to develop a new wound in the right Ischial region. Obviously this is not trending in the direction that we want to see. No fevers, chills, nausea, or vomiting noted at this time. 05/19/17; I have not seen this wound and almost a month however there is nothing really positive to say about it. Necrotic tissue over the surface which superiorly I think abuts on her sacrum. She has surrounding erythema. I would be surprised if there is not underlying osteomyelitis or soft tissue infection. 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 She apparently eats well per description although I wonder about this looking at her. Lab work I did probably 4 weeks ago however was really quite normal including a serum albumin 05/26/17; culture I did last week grew Escherichia coli and methicillin sensitive staph aureus which should've been well covered by the Augmentin and ciprofloxacin. Indeed the erythema around the wound in the bed of the wound looks somewhat better. Her intake is still satisfactory. They  now have a near fluidized bed 06/02/17; they completed the antibiotics last Friday. Using collagen. Daughter still reports eating and drinking well. There is less visualized erythema around the wound. 06/16/17; large pressure ulcer over her lower sacrum and coccyx. Using [name] to the wound bed. 06/30/17; certainly no change in dimensions of this large stage III wound over her sacrum and coccyx. They've been using [name]. There is no exposed bone. She has a candidal/tinea area in the right inguinal area. Other than that her daughter relates that she is eating and drinking well there changing her positioning to make sure the areas offloaded 07/14/17; no major change in the dimensions of this large stage III wound. Initially a  smaller wound that became secondarily infected causing significant tissue breakdown although it is been stable in the last several weeks. Tunneling superiorly at roughly 1:00 no change here either. There is no bone palpable. Both the patient's daughter and caretaker states that she eats well. I have not rechecked her blood work 07/27/17; patient arrives in clinic today and generally a deteriorated looking state. Mild fever with axillary temperature of 100.4. Daughter reports she has not been eating and drinking well since yesterday. She looks more pale and thin and less responsive. We have been using silver collagen to her wound 08/11/17; since the last time the patient was here things have gone better. Her fever went down and she started eating and drinking again. Culture I did of the wound showed methicillin sensitive staph aureus, Morganella and enterococcus. I only treated her with Keflex which would've not covered the Morganella and enterococcus however the purulent area on the 2:00 side of her wound is a lot better and the bandlike erythema that concern me also was resolved. I'd called the daughter last week to confirm that she was a lot better. She finished the Keflex last Thursday she also suffered a skin tear this morning perhaps while putting on her incontinence brief period is on the lateral aspect of her right leg. Clean wound with the surface epithelium not viable. 08/25/17; last week the patient was noted to have erythema around the wound margin and a slight fever which the patient's daughter says was [id number]. Our office was contacted by home health however we did not have a space to work the patient in that she went to see her primary physician Dr. Maurice MarchLane. She was not febrile during this visit on 08/21/17 there was erythema around the wound similar to last occasion. Dr. Maurice MarchLane in reference to my culture from 07/28/17 and put her on doxycycline capsules which they're opening twice a day for 10  days. Tina MainlandSHAW, Theodore W. ([id number]) 09/17/17; patient arrives today with the wound bed looking fairly well granulated. There is undermining from 4 to 6:00 although this seems to of contracted slightly. She does not have obvious infection although the daughter states there was some darkening of the wound circumference that is not evident today. They state she is eating well. They are concerned about oral thrush 09/30/16; very fibrin looking granulated wound bed. Her undermining from 4 to 6:00 is about the same but also appears to be well granulated. She has rolled edges of senescent tissue from roughly 7 to 12:00. There is no evidence of infection 10/13/17 on evaluation today patient appears to be doing fairly well all things considered in regard to her sacral wound. There's really not a lot of significant change or improvement she does have some evidence of contusion and deep tissue injury around the  left border of the wound that patient's daughter did inquire about today. Nonetheless overall the wound appears to be doing about the same in my opinion. There is no significant indication of infection there also is no significant slough noted at this point. 10/27/17 She is here in follow up evaluation of a sacral ulcer. She is accompanied by her daughter and caregiver. There is red granulation tissue throughout, persistent discoloration to left border; this appears consistent with deep tissue injury. There are multiple areas covered in foam borders, tegaderm, etc that are "preventative" with "no wounds". We will continue with prisma and continue with two week follow ups 11/17/17 on evaluation today patient appears to be doing decently well in regard to her wound at this point. She continues to have a sacral wound ulcer. We see her roughly every two weeks. In the last week she was actually in the hospital due to what was diagnosed as sepsis although no organisms were ever identified in the actual blood  cultures. The hospital really was not sure that the wound was the cause of the infection but they really did not figure out anything else that would be a causative organism she did have a CT scan and that revealed no evidence of pneumonia there was also no evidence of urinary tract infection. Again it very well could have been the wound called in those although wound appears to be doing fairly well at this point. Patient History Unable to Obtain Patient History due to Altered Mental Status. Information obtained from Patient. Social History Never smoker, Marital Status - Married, Alcohol Use - Never, Drug Use - No History, Caffeine Use - Never. Medical And Surgical History Notes Ear/Nose/Mouth/Throat dysphagia Neurologic parkinsons, tremor Review of Systems (ROS) Constitutional Symptoms (General Health) Denies complaints or symptoms of Fever, Chills. Respiratory The patient has no complaints or symptoms. Cardiovascular The patient has no complaints or symptoms. Psychiatric The patient has no complaints or symptoms. Objective [name], [name] ([id number]) Constitutional Chronically ill appearing but in no apparent acute distress. Vitals Time Taken: 9:55 AM, Height: 60 in, Weight: 100 lbs, BMI: 19.5, Temperature: 98.0 F, Pulse: 63 bpm, Respiratory Rate: 14 breaths/min. Respiratory normal breathing without difficulty. clear to auscultation bilaterally. Cardiovascular regular rate and rhythm with normal S1, S2. Psychiatric Patient is not able to cooperate in decision making regarding care. Patient has dementia. patient is confused. General Notes: Patient's wound bed actually show signs of good granulation the only thing that is really problematic is that she does have Epiboly noted at the 12 o'clock location as well is that the 6 o'clock location. This is worth of 12 o'clock. In general this seems to be due to the fact that she does have a lot of loose skin in the way this falls  under is the main issue. When it stretched out there's not as much Epiboly noted. In general the wound appears to be overall about the same since I last evaluated her. Integumentary (Hair, Skin) Wound #1 status is Open. Original cause of wound was Pressure Injury. The wound is located on the Midline Coccyx. The wound measures 4.6cm length x 4.3cm width x 0.4cm depth; 15.535cm^2 area and 6.214cm^3 volume. There is muscle and Fat Layer (Subcutaneous Tissue) Exposed exposed. There is no tunneling noted, however, there is undermining starting at 12:00 and ending at 2:00 with a maximum distance of 2.1cm. There is a large amount of purulent drainage noted. Foul odor after cleansing was noted. The wound margin is flat and intact.  There is medium (34-66%) red granulation within the wound bed. There is a medium (34-66%) amount of necrotic tissue within the wound bed including Adherent Slough. The periwound skin appearance exhibited: Induration, Erythema. The periwound skin appearance did not exhibit: Callus, Crepitus, Excoriation, Rash, Scarring, Dry/Scaly, Maceration, Atrophie Blanche, Cyanosis, Ecchymosis, Hemosiderin Staining, Mottled, Pallor, Rubor. The surrounding wound skin color is noted with erythema which is circumferential. Periwound temperature was noted as No Abnormality. The periwound has tenderness on palpation. Assessment Active Problems ICD-10 L89.153 - Pressure ulcer of sacral region, stage 3 G20 - Parkinson's disease L03.312 - Cellulitis of back [any part except buttock] S81.811D - Laceration without foreign body, right lower leg, subsequent encounter Plan Wound Cleansing: Wound #1 Midline Coccyx: [name], [name] ([id number]) Clean wound with Normal Saline. May Shower, gently pat wound dry prior to applying new dressing. Anesthetic (add to Medication List): Wound #1 Midline Coccyx: Topical Lidocaine 4% cream applied to wound bed prior to debridement (In Clinic Only). Skin  Barriers/Peri-Wound Care: Wound #1 Midline Coccyx: Skin Prep Primary Wound Dressing: Wound #1 Midline Coccyx: Prisma Ag - pack into undermining, may require 2 pieces of prisma ag for wound packing Secondary Dressing: Wound #1 Midline Coccyx: Boardered Foam Dressing - HHRN PLEASE ORDER ALLEVYN LIFE BORDERED FOAM FOR PATIENT (OFF BRANDS IRRITATE HER SKIN) Dressing Change Frequency: Wound #1 Midline Coccyx: Change dressing every day. Follow-up Appointments: Return Appointment in 2 weeks. Off-Loading: Wound #1 Midline Coccyx: Roho cushion for wheelchair Mattress - fluidized air mattress Turn and reposition every 2 hours Additional Orders / Instructions: Wound #1 Midline Coccyx: Increase protein intake. - please add protein supplements to patients diet Other: - please add vitamin A, vitamin C and zinc supplements to patients diet Home Health: Wound #1 Midline Coccyx: Continue Home Health Visits - [facility] Nurse may visit PRN to address patient s wound care needs. FACE TO FACE ENCOUNTER: MEDICARE and MEDICAID PATIENTS: I certify that this patient is under my care and that I had a face-to-face encounter that meets the physician face-to-face encounter requirements with this patient on this date. The encounter with the patient was in whole or in part for the following MEDICAL CONDITION: (primary reason for Home Healthcare) MEDICAL NECESSITY: I certify, that based on my findings, NURSING services are a medically necessary home health service. HOME BOUND STATUS: I certify that my clinical findings support that this patient is homebound (i.e., Due to illness or injury, pt requires aid of supportive devices such as crutches, cane, wheelchairs, walkers, the use of special transportation or the assistance of another person to leave their place of residence. There is a normal inability to leave the home and doing so requires considerable and taxing effort. Other absences are for  medical reasons / religious services and are infrequent or of short duration when for other reasons). If current dressing causes regression in wound condition, may D/C ordered dressing product/s and apply Normal Saline Moist Dressing daily until next Wound Healing Center / Other MD appointment. Notify Wound Healing Center of regression in wound condition at [phone]. Please direct any NON-WOUND related issues/requests for orders to patient's Primary Care Physician - Dr Vonita Moss The following medication(s) was prescribed: lidocaine topical 4 % cream 1 1 cream topical was prescribed at facility At this point I'm going to recommend that we continue with the Current wound care measures for the next week. I did recommend kinesiology tape as a possibility to try and help hold back the wound edges so they do  not fold in on themselves as they have struggled with all long. My only concern is if she were to attempt this tape that it could be somewhat damaging to the patient's skin which is likely more fragile than the average athlete who would be using the kinesiology tape. Nonetheless this is definitely a possibility for trying although I was tried on a small area before using it more widespread in [place]. ([id number]) nature. Patient's daughter is in agreement with the plan. We will see her for fault evaluation in two weeks time per the regular set schedule if anything worsens in the interim patient's daughter will contact us for additional recommendations. Electronic Signature(s) Signed: 11/17/2017 5:27:42 PM By: Lenda Kelp PA-C Entered By: Lenda Kelp on 11/17/2017 13:11:16 [name] ([id number]) -------------------------------------------------------------------------------- ROS/PFSH Details Patient Name: [name] Date of Service: 11/17/2017 10:00 AM Medical Record Number: [id number] Patient Account Number: [id number] Date of Birth/Sex: 06/13/1923 (84 y.o.  Female) Treating RN: Ashok Cordia, Debi Primary Care Provider: Vonita Moss Other Clinician: Referring Provider: Vonita Moss Treating Provider/Extender: Linwood Dibbles, Audrick Lamoureaux Weeks in Treatment: 51 Unable to Obtain Patient History due to oo Altered Mental Status Information Obtained From Patient Wound History Do you currently have one or more open woundso Yes How many open wounds do you currently haveo 1 Approximately how long have you had your woundso 1 week How have you been treating your wound(s) until nowo santyl and gauze Has your wound(s) ever healed and then re-openedo No Have you had any lab work done in the past montho No Have you tested positive for an antibiotic resistant organism (MRSA, VRE)o No Have you tested positive for osteomyelitis (bone infection)o No Have you had any tests for circulation on your legso No Constitutional Symptoms (General Health) Complaints and Symptoms: Negative for: Fever; Chills Eyes Medical History: Negative for: Cataracts; Glaucoma; Optic Neuritis Ear/Nose/Mouth/Throat Medical History: Negative for: Chronic sinus problems/congestion; Middle ear problems Past Medical History Notes: dysphagia Hematologic/Lymphatic Medical History: Positive for: Anemia Negative for: Hemophilia; Human Immunodeficiency Virus; Lymphedema; Sickle Cell Disease Respiratory Complaints and Symptoms: No Complaints or Symptoms Medical History: Negative for: Aspiration; Asthma; Chronic Obstructive Pulmonary Disease (COPD); Pneumothorax; Sleep Apnea; Tuberculosis Cardiovascular [name], CHARLEY. ([id number]) Complaints and Symptoms: No Complaints or Symptoms Medical History: Positive for: Hypertension Negative for: Angina; Arrhythmia; Congestive Heart Failure; Coronary Artery Disease; Deep Vein Thrombosis; Hypotension; Myocardial Infarction; Peripheral Arterial Disease; Peripheral Venous Disease; Phlebitis; Vasculitis Gastrointestinal Medical History: Negative  for: Cirrhosis ; Colitis; Crohnos; Hepatitis A; Hepatitis B; Hepatitis C Endocrine Medical History: Negative for: Type I Diabetes; Type II Diabetes Genitourinary Medical History: Negative for: End Stage Renal Disease Immunological Medical History: Negative for: Lupus Erythematosus; Raynaudos; Scleroderma Integumentary (Skin) Medical History: Negative for: History of Burn; History of pressure wounds Musculoskeletal Medical History: Negative for: Gout; Rheumatoid Arthritis; Osteoarthritis; Osteomyelitis Neurologic Medical History: Positive for: Dementia Negative for: Neuropathy; Quadriplegia; Paraplegia; Seizure Disorder Past Medical History Notes: parkinsons, tremor Oncologic Medical History: Negative for: Received Chemotherapy; Received Radiation Psychiatric Complaints and Symptoms: No Complaints or Symptoms Medical History: Negative for: Anorexia/bulimia; Confinement Anxiety [name], [name] ([id number]) Immunizations Pneumococcal Vaccine: Received Pneumococcal Vaccination: Yes Immunization Notes: up to date Implantable Devices Family and Social History Never smoker; Marital Status - Married; Alcohol Use: Never; Drug Use: No History; Caffeine Use: Never; Financial Concerns: No; Food, Clothing or Shelter Needs: No; Support System Lacking: No; Transportation Concerns: No; Advanced Directives: Yes (Not Provided); Patient does not want information on Advanced Directives; Medical Power of Attorney:  Yes - dtr (Not Provided) Physician Affirmation I have reviewed and agree with the above information. Electronic Signature(s) Signed: 11/17/2017 4:06:48 PM By: Alejandro Mulling Signed: 11/17/2017 5:27:42 PM By: Lenda Kelp PA-C Entered By: Lenda Kelp on 11/17/2017 10:30:35 [name] ([id number]) -------------------------------------------------------------------------------- SuperBill Details Patient Name: [name] Date of Service: 11/17/2017 Medical  Record Number: [id number] Patient Account Number: [id number] Date of Birth/Sex: Dec 17, 1922 (84 y.o. Female) Treating RN: Ashok Cordia, Debi Primary Care Provider: Vonita Moss Other Clinician: Referring Provider: Vonita Moss Treating Provider/Extender: Linwood Dibbles, Addaleigh Nicholls Weeks in Treatment: 44 Diagnosis Coding ICD-10 Codes Code Description L89.153 Pressure ulcer of sacral region, stage 3 G20 Parkinson's disease L03.312 Cellulitis of back [any part except buttock] S81.811D Laceration without foreign body, right lower leg, subsequent encounter Facility Procedures CPT4 Code: [id number] Description: 99213 - WOUND CARE VISIT-LEV 3 EST PT Modifier: Quantity: 1 Physician Procedures CPT4 Code: [id number] Description: 99213 - WC PHYS LEVEL 3 - EST PT ICD-10 Diagnosis Description L89.153 Pressure ulcer of sacral region, stage 3 G20 Parkinson's disease L03.312 Cellulitis of back [any part except buttock] S81.811D Laceration without foreign body, right lower  leg, subseque Modifier: nt encounter Quantity: 1 Electronic Signature(s) Signed: 11/17/2017 10:53:11 AM By: Alejandro Mulling Signed: 11/17/2017 5:27:42 PM By: Lenda Kelp PA-C Entered By: Alejandro Mulling on 11/17/2017 10:53:11

## 2017-11-19 NOTE — Progress Notes (Signed)
DANYALE, RIDINGER ([id number]) Visit Report for 11/17/2017 Arrival Information Details Patient Name: [name], [name] Date of Service: 11/17/2017 10:00 AM Medical Record Number: [id number] Patient Account Number: [id number] Date of Birth/Sex: October 11, 1922 (84 y.o. Female) Treating RN: Curtis Sites Primary Care Hilaria Titsworth: Vonita Moss Other Clinician: Referring Cartez Mogle: Vonita Moss Treating Eustace Hur/Extender: Linwood Dibbles, HOYT Weeks in Treatment: 44 Visit Information History Since Last Visit Added or deleted any medications: No Patient Arrived: Wheel Chair Any new allergies or adverse reactions: No Arrival Time: 09:50 Had a fall or experienced change in No activities of daily living that may affect Accompanied By: dtr and cg risk of falls: Transfer Assistance: Manual Signs or symptoms of abuse/neglect since No Patient Identification Verified: Yes last visito Secondary Verification Process Completed: Yes Hospitalized since last visit: No Patient Requires Transmission-Based No Has Dressing in Place as Prescribed: Yes Precautions: Pain Present Now: Unable to Patient Has Alerts: No Respond Electronic Signature(s) Signed: 11/17/2017 4:30:06 PM By: Curtis Sites Entered By: Curtis Sites on 11/17/2017 09:50:25 [name] ([id number]) -------------------------------------------------------------------------------- Clinic Level of Care Assessment Details Patient Name: [name] Date of Service: 11/17/2017 10:00 AM Medical Record Number: [id number] Patient Account Number: [id number] Date of Birth/Sex: March 14, 1923 (84 y.o. Female) Treating RN: Ashok Cordia, Debi Primary Care Harlem Bula: Vonita Moss Other Clinician: Referring Raphael Fitzpatrick: Vonita Moss Treating Braun Rocca/Extender: Linwood Dibbles, HOYT Weeks in Treatment: 44 Clinic Level of Care Assessment Items TOOL 4 Quantity Score X - Use when only an EandM is performed on FOLLOW-UP visit 1 0 ASSESSMENTS - Nursing Assessment /  Reassessment X - Reassessment of Co-morbidities (includes updates in patient status) 1 10 X- 1 5 Reassessment of Adherence to Treatment Plan ASSESSMENTS - Wound and Skin Assessment / Reassessment X - Simple Wound Assessment / Reassessment - one wound 1 5 []  - 0 Complex Wound Assessment / Reassessment - multiple wounds []  - 0 Dermatologic / Skin Assessment (not related to wound area) ASSESSMENTS - Focused Assessment []  - Circumferential Edema Measurements - multi extremities 0 []  - 0 Nutritional Assessment / Counseling / Intervention []  - 0 Lower Extremity Assessment (monofilament, tuning fork, pulses) []  - 0 Peripheral Arterial Disease Assessment (using hand held doppler) ASSESSMENTS - Ostomy and/or Continence Assessment and Care []  - Incontinence Assessment and Management 0 []  - 0 Ostomy Care Assessment and Management (repouching, etc.) PROCESS - Coordination of Care []  - Simple Patient / Family Education for ongoing care 0 X- 1 20 Complex (extensive) Patient / Family Education for ongoing care X- 1 10 Staff obtains Chiropractor, Records, Test Results / Process Orders X- 1 10 Staff telephones HHA, Nursing Homes / Clarify orders / etc []  - 0 Routine Transfer to another Facility (non-emergent condition) []  - 0 Routine Hospital Admission (non-emergent condition) []  - 0 New Admissions / Manufacturing engineer / Ordering NPWT, Apligraf, etc. []  - 0 Emergency Hospital Admission (emergent condition) X- 1 10 Simple Discharge Coordination [name], [name]. ([id number]) []  - 0 Complex (extensive) Discharge Coordination PROCESS - Special Needs []  - Pediatric / Minor Patient Management 0 []  - 0 Isolation Patient Management []  - 0 Hearing / Language / Visual special needs []  - 0 Assessment of Community assistance (transportation, D/C planning, etc.) []  - 0 Additional assistance / Altered mentation []  - 0 Support Surface(s) Assessment (bed, cushion, seat, etc.) INTERVENTIONS -  Wound Cleansing / Measurement X - Simple Wound Cleansing - one wound 1 5 []  - 0 Complex Wound Cleansing - multiple wounds X- 1 5 Wound Imaging (photographs - any number of  wounds) []  - 0 Wound Tracing (instead of photographs) X- 1 5 Simple Wound Measurement - one wound []  - 0 Complex Wound Measurement - multiple wounds INTERVENTIONS - Wound Dressings X - Small Wound Dressing one or multiple wounds 1 10 []  - 0 Medium Wound Dressing one or multiple wounds []  - 0 Large Wound Dressing one or multiple wounds X- 1 5 Application of Medications - topical []  - 0 Application of Medications - injection INTERVENTIONS - Miscellaneous []  - External ear exam 0 []  - 0 Specimen Collection (cultures, biopsies, blood, body fluids, etc.) []  - 0 Specimen(s) / Culture(s) sent or taken to Lab for analysis []  - 0 Patient Transfer (multiple staff / Nurse, adultHoyer Lift / Similar devices) []  - 0 Simple Staple / Suture removal (25 or less) []  - 0 Complex Staple / Suture removal (26 or more) []  - 0 Hypo / Hyperglycemic Management (close monitor of Blood Glucose) []  - 0 Ankle / Brachial Index (ABI) - do not check if billed separately X- 1 5 Vital Signs Cronce, Terry W. ([id number]) Has the patient been seen at the hospital within the last three years: Yes Total Score: 105 Level Of Care: New/Established - Level 3 Electronic Signature(s) Signed: 11/17/2017 4:06:48 PM By: Alejandro MullingPinkerton, Debra Entered By: Alejandro MullingPinkerton, Debra on 11/17/2017 10:53:03 Tina MainlandSHAW, [name] ([id number]) -------------------------------------------------------------------------------- Encounter Discharge Information Details Patient Name: Tina MainlandSHAW, [name] Date of Service: 11/17/2017 10:00 AM Medical Record Number: [id number] Patient Account Number: [id number] Date of Birth/Sex: 15-May-1923 (84 y.o. Female) Treating RN: Renne CriglerFlinchum, Cheryl Primary Care Brigido Mera: Vonita MossRISSMAN, MARK Other Clinician: Referring Nandana Krolikowski: Vonita MossRISSMAN, MARK Treating  Alera Quevedo/Extender: Linwood DibblesSTONE III, HOYT Weeks in Treatment: 8544 Encounter Discharge Information Items Discharge Pain Level: 0 Discharge Condition: Stable Ambulatory Status: Wheelchair Discharge Destination: Home Transportation: Private Auto Accompanied By: daughter Schedule Follow-up Appointment: Yes Medication Reconciliation completed and No provided to Patient/Care Jamariyah Johannsen: Provided on Clinical Summary of Care: 11/17/2017 Form Type Recipient Paper Patient ES Electronic Signature(s) Signed: 11/18/2017 8:42:29 AM By: Gwenlyn PerkingMoore, Shelia Entered By: Gwenlyn PerkingMoore, Shelia on 11/17/2017 10:40:08 Tina MainlandSHAW, [name] ([id number]) -------------------------------------------------------------------------------- Lower Extremity Assessment Details Patient Name: Tina MainlandSHAW, [name] Date of Service: 11/17/2017 10:00 AM Medical Record Number: [id number] Patient Account Number: [id number] Date of Birth/Sex: 15-May-1923 (84 y.o. Female) Treating RN: Curtis Sitesorthy, Joanna Primary Care Jeramy Dimmick: Vonita MossRISSMAN, MARK Other Clinician: Referring Lejla Moeser: Vonita MossRISSMAN, MARK Treating Odeth Bry/Extender: Linwood DibblesSTONE III, HOYT Weeks in Treatment: 44 Electronic Signature(s) Signed: 11/17/2017 4:30:06 PM By: Curtis Sitesorthy, Joanna Entered By: Curtis Sitesorthy, Joanna on 11/17/2017 10:03:11 Tina MainlandSHAW, [name] ([id number]) -------------------------------------------------------------------------------- Multi Wound Chart Details Patient Name: Tina MainlandSHAW, Crystin W. Date of Service: 11/17/2017 10:00 AM Medical Record Number: [id number] Patient Account Number: [id number] Date of Birth/Sex: 15-May-1923 (84 y.o. Female) Treating RN: Ashok CordiaPinkerton, Debi Primary Care Natalin Bible: Vonita MossRISSMAN, MARK Other Clinician: Referring Noach Calvillo: Vonita MossRISSMAN, MARK Treating Hanzel Pizzo/Extender: STONE III, HOYT Weeks in Treatment: 44 Vital Signs Height(in): 60 Pulse(bpm): 63 Weight(lbs): 100 Blood Pressure(mmHg): Body Mass Index(BMI): 20 Temperature(F): 98.0 Respiratory Rate 14 (breaths/min): Photos:  [N/A:N/A] Wound Location: Coccyx - Midline N/A N/A Wounding Event: Pressure Injury N/A N/A Primary Etiology: Pressure Ulcer N/A N/A Comorbid History: Anemia, Hypertension, N/A N/A Dementia Date Acquired: 12/01/2016 N/A N/A Weeks of Treatment: 44 N/A N/A Wound Status: Open N/A N/A Measurements L x W x D 4.6x4.3x0.4 N/A N/A (cm) Area (cm) : 15.535 N/A N/A Volume (cm) : 6.214 N/A N/A % Reduction in Area: -273.90% N/A N/A % Reduction in Volume: -273.90% N/A N/A Starting Position 1 12 (o'clock): Ending Position 1 2 (o'clock): Maximum Distance 1 (cm): 2.1 Undermining: Yes N/A  N/A Classification: Category/Stage IV N/A N/A Exudate Amount: Large N/A N/A Exudate Type: Purulent N/A N/A Exudate Color: yellow, brown, green N/A N/A Foul Odor After Cleansing: Yes N/A N/A Odor Anticipated Due to No N/A N/A Product Use: Wound Margin: Flat and Intact N/A N/A Granulation Amount: Medium (34-66%) N/A N/A FALLON, HAECKER. ([id number]) Granulation Quality: Red N/A N/A Necrotic Amount: Medium (34-66%) N/A N/A Exposed Structures: Fat Layer (Subcutaneous N/A N/A Tissue) Exposed: Yes Muscle: Yes Fascia: No Tendon: No Joint: No Bone: No Epithelialization: None N/A N/A Periwound Skin Texture: Induration: Yes N/A N/A Excoriation: No Callus: No Crepitus: No Rash: No Scarring: No Periwound Skin Moisture: Maceration: No N/A N/A Dry/Scaly: No Periwound Skin Color: Erythema: Yes N/A N/A Atrophie Blanche: No Cyanosis: No Ecchymosis: No Hemosiderin Staining: No Mottled: No Pallor: No Rubor: No Erythema Location: Circumferential N/A N/A Temperature: No Abnormality N/A N/A Tenderness on Palpation: Yes N/A N/A Wound Preparation: Ulcer Cleansing: N/A N/A Rinsed/Irrigated with Saline Topical Anesthetic Applied: Other: lidocaine 4% Treatment Notes Electronic Signature(s) Signed: 11/17/2017 4:06:48 PM By: Alejandro Mulling Entered By: Alejandro Mulling on 11/17/2017 10:13:45 [name] ([id number]) -------------------------------------------------------------------------------- Multi-Disciplinary Care Plan Details Patient Name: [name] Date of Service: 11/17/2017 10:00 AM Medical Record Number: [id number] Patient Account Number: [id number] Date of Birth/Sex: 1922/12/26 (84 y.o. Female) Treating RN: Ashok Cordia, Debi Primary Care Nino Amano: Vonita Moss Other Clinician: Referring Judie Hollick: Vonita Moss Treating Iyana Topor/Extender: Linwood Dibbles, HOYT Weeks in Treatment: 44 Active Inactive ` Abuse / Safety / Falls / Self Care Management Nursing Diagnoses: Impaired physical mobility Potential for falls Goals: Patient will remain injury free Date Initiated: 01/07/2017 Target Resolution Date: 04/03/2017 Goal Status: Active Interventions: Assess fall risk on admission and as needed Notes: ` Nutrition Nursing Diagnoses: Potential for alteratiion in Nutrition/Potential for imbalanced nutrition Goals: Patient/caregiver agrees to and verbalizes understanding of need to use nutritional supplements and/or vitamins as prescribed Date Initiated: 01/07/2017 Target Resolution Date: 04/03/2017 Goal Status: Active Interventions: Assess patient nutrition upon admission and as needed per policy Notes: ` Orientation to the Wound Care Program Nursing Diagnoses: Knowledge deficit related to the wound healing center program Goals: Patient/caregiver will verbalize understanding of the Wound Healing Center Program Date Initiated: 01/07/2017 Target Resolution Date: 04/03/2017 Goal Status: Active [name], [name] ([id number]) Interventions: Provide education on orientation to the wound center Notes: ` Pressure Nursing Diagnoses: Knowledge deficit related to causes and risk factors for pressure ulcer development Goals: Patient will remain free from development of additional pressure ulcers Date Initiated: 01/07/2017 Target Resolution Date: 04/03/2017 Goal Status:  Active Interventions: Assess potential for pressure ulcer upon admission and as needed Notes: ` Wound/Skin Impairment Nursing Diagnoses: Impaired tissue integrity Goals: Patient/caregiver will verbalize understanding of skin care regimen Date Initiated: 01/07/2017 Target Resolution Date: 04/03/2017 Goal Status: Active Ulcer/skin breakdown will have a volume reduction of 30% by week 4 Date Initiated: 01/07/2017 Target Resolution Date: 04/03/2017 Goal Status: Active Ulcer/skin breakdown will have a volume reduction of 50% by week 8 Date Initiated: 01/07/2017 Target Resolution Date: 04/03/2017 Goal Status: Active Ulcer/skin breakdown will have a volume reduction of 80% by week 12 Date Initiated: 01/07/2017 Target Resolution Date: 04/03/2017 Goal Status: Active Ulcer/skin breakdown will heal within 14 weeks Date Initiated: 01/07/2017 Target Resolution Date: 04/03/2017 Goal Status: Active Interventions: Assess patient/caregiver ability to obtain necessary supplies Assess patient/caregiver ability to perform ulcer/skin care regimen upon admission and as needed Assess ulceration(s) every visit Notes: Electronic Signature(s) [name], [name] ([id number]) Signed: 11/17/2017 4:06:48 PM By: Alejandro Mulling Entered By:  Alejandro Mulling on 11/17/2017 10:13:30 [name], [name] ([id number]) -------------------------------------------------------------------------------- Pain Assessment Details Patient Name: [name], [name] Date of Service: 11/17/2017 10:00 AM Medical Record Number: [id number] Patient Account Number: [id number] Date of Birth/Sex: 14-Nov-1922 (84 y.o. Female) Treating RN: Curtis Sites Primary Care Victorious Kundinger: Vonita Moss Other Clinician: Referring Rowan Blaker: Vonita Moss Treating Mackson Botz/Extender: Linwood Dibbles, HOYT Weeks in Treatment: 76 Active Problems Location of Pain Severity and Description of Pain Patient Has Paino Patient Unable to Respond Site Locations Pain Management  and Medication Current Pain Management: Electronic Signature(s) Signed: 11/17/2017 4:30:06 PM By: Curtis Sites Entered By: Curtis Sites on 11/17/2017 09:50:39 [name] ([id number]) -------------------------------------------------------------------------------- Patient/Caregiver Education Details Patient Name: [name] Date of Service: 11/17/2017 10:00 AM Medical Record Number: [id number] Patient Account Number: [id number] Date of Birth/Gender: 03-14-1923 (84 y.o. Female) Treating RN: Renne Crigler Primary Care Physician: Vonita Moss Other Clinician: Referring Physician: Vonita Moss Treating Physician/Extender: Skeet Simmer in Treatment: 64 Education Assessment Education Provided To: Patient Education Topics Provided Wound/Skin Impairment: Handouts: Caring for Your Ulcer Methods: Explain/Verbal Responses: State content correctly Electronic Signature(s) Signed: 11/17/2017 2:14:45 PM By: Renne Crigler Entered By: Renne Crigler on 11/17/2017 10:37:20 [name] ([id number]) -------------------------------------------------------------------------------- Wound Assessment Details Patient Name: [name] Date of Service: 11/17/2017 10:00 AM Medical Record Number: [id number] Patient Account Number: [id number] Date of Birth/Sex: 1923-02-20 (84 y.o. Female) Treating RN: Curtis Sites Primary Care Shavawn Stobaugh: Vonita Moss Other Clinician: Referring Marke Goodwyn: Vonita Moss Treating Cori Justus/Extender: Linwood Dibbles, HOYT Weeks in Treatment: 44 Wound Status Wound Number: 1 Primary Etiology: Pressure Ulcer Wound Location: Coccyx - Midline Wound Status: Open Wounding Event: Pressure Injury Comorbid History: Anemia, Hypertension, Dementia Date Acquired: 12/01/2016 Weeks Of Treatment: 44 Clustered Wound: No Photos Photo Uploaded By: Curtis Sites on 11/17/2017 10:09:27 Wound Measurements Length: (cm) 4.6 Width: (cm) 4.3 Depth: (cm)  0.4 Area: (cm) 15.535 Volume: (cm) 6.214 % Reduction in Area: -273.9% % Reduction in Volume: -273.9% Epithelialization: None Tunneling: No Undermining: Yes Starting Position (o'clock): 12 Ending Position (o'clock): 2 Maximum Distance: (cm) 2.1 Wound Description Classification: Category/Stage IV Wound Margin: Flat and Intact Exudate Amount: Large Exudate Type: Purulent Exudate Color: yellow, brown, green Foul Odor After Cleansing: Yes Due to Product Use: No Slough/Fibrino Yes Wound Bed Granulation Amount: Medium (34-66%) Exposed Structure Granulation Quality: Red Fascia Exposed: No Necrotic Amount: Medium (34-66%) Fat Layer (Subcutaneous Tissue) Exposed: Yes Necrotic Quality: Adherent Slough Tendon Exposed: No Muscle Exposed: Yes [name], BETTERTON. ([id number]) Necrosis of Muscle: No Joint Exposed: No Bone Exposed: No Periwound Skin Texture Texture Color No Abnormalities Noted: No No Abnormalities Noted: No Callus: No Atrophie Blanche: No Crepitus: No Cyanosis: No Excoriation: No Ecchymosis: No Induration: Yes Erythema: Yes Rash: No Erythema Location: Circumferential Scarring: No Hemosiderin Staining: No Mottled: No Moisture Pallor: No No Abnormalities Noted: No Rubor: No Dry / Scaly: No Maceration: No Temperature / Pain Temperature: No Abnormality Tenderness on Palpation: Yes Wound Preparation Ulcer Cleansing: Rinsed/Irrigated with Saline Topical Anesthetic Applied: Other: lidocaine 4%, Treatment Notes Wound #1 (Midline Coccyx) 1. Cleansed with: Clean wound with Normal Saline 2. Anesthetic Topical Lidocaine 4% cream to wound bed prior to debridement 4. Dressing Applied: Prisma Ag 5. Secondary Dressing Applied Bordered Foam Dressing Electronic Signature(s) Signed: 11/17/2017 4:30:06 PM By: Curtis Sites Entered By: Curtis Sites on 11/17/2017 10:02:59 [name]  ([id number]) -------------------------------------------------------------------------------- Vitals Details Patient Name: [name] Date of Service: 11/17/2017 10:00 AM Medical Record Number: [id number] Patient Account Number: [id number] Date of Birth/Sex: Dec 04, 1922 (84 y.o. Female)  Treating RN: Curtis Sites Primary Care Godric Lavell: Vonita Moss Other Clinician: Referring Kyle Luppino: Vonita Moss Treating Nevaan Bunton/Extender: Linwood Dibbles, HOYT Weeks in Treatment: 44 Vital Signs Time Taken: 09:55 Temperature (F): 98.0 Height (in): 60 Pulse (bpm): 63 Weight (lbs): 100 Respiratory Rate (breaths/min): 14 Body Mass Index (BMI): 19.5 Reference Range: 80 - 120 mg / dl Electronic Signature(s) Signed: 11/17/2017 4:30:06 PM By: Curtis Sites Entered By: Curtis Sites on 11/17/2017 09:54:23

## 2017-11-24 ENCOUNTER — Ambulatory Visit: Payer: Medicare Other | Admitting: Physician Assistant

## 2017-11-25 ENCOUNTER — Inpatient Hospital Stay: Payer: Medicare Other | Admitting: Family Medicine

## 2017-12-01 ENCOUNTER — Encounter: Payer: Medicare Other | Attending: Physician Assistant | Admitting: Physician Assistant

## 2017-12-01 DIAGNOSIS — Z79899 Other long term (current) drug therapy: Secondary | ICD-10-CM | POA: Diagnosis not present

## 2017-12-01 DIAGNOSIS — Z888 Allergy status to other drugs, medicaments and biological substances status: Secondary | ICD-10-CM | POA: Insufficient documentation

## 2017-12-01 DIAGNOSIS — B359 Dermatophytosis, unspecified: Secondary | ICD-10-CM | POA: Diagnosis not present

## 2017-12-01 DIAGNOSIS — Z882 Allergy status to sulfonamides status: Secondary | ICD-10-CM | POA: Insufficient documentation

## 2017-12-01 DIAGNOSIS — F0390 Unspecified dementia without behavioral disturbance: Secondary | ICD-10-CM | POA: Diagnosis not present

## 2017-12-01 DIAGNOSIS — I10 Essential (primary) hypertension: Secondary | ICD-10-CM | POA: Diagnosis not present

## 2017-12-01 DIAGNOSIS — R32 Unspecified urinary incontinence: Secondary | ICD-10-CM | POA: Diagnosis not present

## 2017-12-01 DIAGNOSIS — I739 Peripheral vascular disease, unspecified: Secondary | ICD-10-CM | POA: Insufficient documentation

## 2017-12-01 DIAGNOSIS — D649 Anemia, unspecified: Secondary | ICD-10-CM | POA: Diagnosis not present

## 2017-12-01 DIAGNOSIS — G20 Parkinson's disease: Secondary | ICD-10-CM | POA: Insufficient documentation

## 2017-12-01 DIAGNOSIS — L03312 Cellulitis of back [any part except buttock]: Secondary | ICD-10-CM | POA: Diagnosis not present

## 2017-12-01 DIAGNOSIS — Z881 Allergy status to other antibiotic agents status: Secondary | ICD-10-CM | POA: Insufficient documentation

## 2017-12-01 DIAGNOSIS — L89153 Pressure ulcer of sacral region, stage 3: Secondary | ICD-10-CM | POA: Insufficient documentation

## 2017-12-03 NOTE — Progress Notes (Signed)
[name], [name] ([id number]) Visit Report for 12/01/2017 Chief Complaint Document Details Patient Name: [name], [name] Date of Service: 12/01/2017 10:15 AM Medical Record Number: [id number] Patient Account Number: [id number] Date of Birth/Sex: [birth year] (84 y.o. Female) Treating RN: Ashok CordiaPinkerton, Debi Primary Care Provider: Vonita MossRISSMAN, MARK Other Clinician: Referring Provider: Vonita MossRISSMAN, MARK Treating Provider/Extender: Linwood DibblesSTONE III, Angelyse Heslin Weeks in Treatment: 3546 Information Obtained from: Patient Chief Complaint patient is here for follow up evaluation of a sacral pressure ulcer Electronic Signature(s) Signed: 12/01/2017 7:21:18 PM By: Lenda KelpStone III, Xiamara Hulet PA-C Entered By: Lenda KelpStone III, Lakiah Dhingra on 12/01/2017 10:44:03 [name], [name] ([id number]) -------------------------------------------------------------------------------- HPI Details Patient Name: [name], [name] Date of Service: 12/01/2017 10:15 AM Medical Record Number: [id number] Patient Account Number: [id number] Date of Birth/Sex: [birth year] (84 y.o. Female) Treating RN: Ashok CordiaPinkerton, Debi Primary Care Provider: Vonita MossRISSMAN, MARK Other Clinician: Referring Provider: Vonita MossRISSMAN, MARK Treating Provider/Extender: Linwood DibblesSTONE III, Kayna Suppa Weeks in Treatment: 46 History of Present Illness HPI Description: 01/07/17 this is a 84 year old woman admitted to the clinic today for review of a pressure ulcer on her lower sacrum. She is referred from her primary physician's office after being seen on 3/22 with a 3 cm pressure area. Her daughter and caretaker accompanied her today state that the area first became obvious about a month ago and his since deteriorated. They have recently got [name] a home health involved and have been using [name] to the wound. They have ordered a pressure relief surface for her mattress. They are turning her religiously. They state that she eats well and they've been forcing fluids on her. She is on a multivitamin. Looking through [facility]  point last albumin I see was 4.4 on 10/28. The patient has advanced parkinsonism which looks superficially like advanced Parkinson's disease although her daughter tells me she did not ever respond to Sinemet therefore this may have another pathology with signs of parkinsonism. However I think this is largely a mute point currently. She also has dementia and is nonambulatory. Since this started they have been keeping her in bed and turning her religiously every 2 hours. She lives at home in [place] with her husband with 24/7 care giving 01/13/17 santyl change qd. Still will require further debridement. continue santyl. 01/20/17; patient's wound actually looks some better less adherent necrotic surface. There is actually visible granulation. We're using [name] 01/27/17; better-looking surface but still a lot of necrotic tissue on the base of this wound. The periwound erythema is better than last week we are still using [name]. Her daughter tells us that she is still having trouble with the pressure-relief mattress through medical modalities 02/03/18; I had the patient scheduled for a two week followup however her daughter brought her in early concerned for discoloration on 2 areas of the wound circumference. We have bee using santyl 02/10/17;Better looking surface to the wound. Rim appears better suggesting better offloading. Using santyl 02/24/17; change to Silver Collegen last time. Wound appears better. 03/10/17; still using silver collagen religious offloading. Intake is satisfactory per her daughter. Dimension slightly better 03/12/2017 -- [name]'s patient who had been seen 2 days ago and was doing fairly well. The patient is brought in by her daughter who noticed a new wound just above the previous wound on her sacral area and going on more to the left lateral side. She was very concerned and we asked her to get in for an opinion. 03/17/17; above is noted. The patient has developed a progressive  area to the left of her original wound. This  seems to this started with a ring of red skin with a more pale interior almost looking fungal. There was a rim of blister through part of the area although this did not look like zoster. They have been applying triamcinolone that was prescribed last week by Dr. Meyer Russel and the area has a fold to a linear band area which is confluent, erythematous and with obvious epidermal swelling but there is no overt tenderness or crepitus. She has lost some surface epithelium closer to the wound surface itself and now has a more superficial wound in this area and the extending erythema goes towards the left buttock. This is well demarcated between involved in normal skin but once again does not appear to be at all tender. If there is a contact issue here I cannot get the history out of the daughter or the caregiver that are with her. 03/24/17; the patient arrives today with the wound slightly worse slightly more drainage. The bandlike area of erythema that I treated as a possible pineal infection has improved somewhat although proximally is still has confluent erythema without overt tenderness. We have been using silver alginate since the most recent deterioration. To the bandlike degree of erythema we have been using Lotrisone cream 03/31/17; patient arrives today with the wound slightly larger, necrotic surface and surrounding erythema. The bandlike area of erythema that I treated as a possible pineal infection is less swollen but still present I've been using Lotrisone cream on that largely related to the presence of a tinea looking infection when this was first seen. We've been using silver alginate. X-ray that I ordered last week showed no acute bony abnormalities mild fecal impaction. Lab work showed a comprehensive metabolic panel that was normal including an albumin of 3.8. White count was 9.5 hemoglobin 12.3 differential count normal. C-reactive protein was less  than 1 and sedimentation rate and 17. The latter 2 values does not support an ongoing bacterial infection. 04/14/17; patient arrives after a 2 week hiatus. Her wound is not in good condition. Although the base of the wound looks SYRENA, BURGES. ([id number]) stable she still has an erythematous area that was apparently blistered over the weekend. This again points to the left. As our intake nurse pointed out today this is in the area where the tissue folds together and we may need to prevent try to prevent this. Lab work and x-ray that I did to 3 weeks ago were unremarkable including her albumin nevertheless she is an extremely frail condition physically. We have have been using [name] 04/22/17; I changed her to silver alginate because of the surrounding maceration and moisture last week. The daughter did not like the way the wound looked in the middle of the week and changed her back to [place]. They're putting gauze on top of this. Thinks still using Lotrisone. 05/06/17 on evaluation today patient sacral wound appears to be doing okay and does not seem to be any worse. She is having no significant pain during evaluation today the secondary to mental status she is unable to rate or describe whether she had any pain she was not however flinching. Her daughter states that the wound does appear to be looking better to her. Still we are having difficulty with the skinfold that seems to be closing in on itself at this point. All in all I feel like she is making some good progress in the [name] seems to be the official for her. They do tell me that a refill if  we are gonna continue that today. No fevers, chills, nausea, or vomiting noted at this time. 05/13/17 presents today for evaluation concerning her ongoing sacral pressure ulcer. Unfortunately she also has an area of deep tissue injury in the right Ischial region which is starting to show up. The sacral wound also continues to show signs of necrotic  tissue overlying and has declined. Overall we really have not seen a significant improvement in the past several months in regard to the sacral wound and now patient is starting to develop a new wound in the right Ischial region. Obviously this is not trending in the direction that we want to see. No fevers, chills, nausea, or vomiting noted at this time. 05/19/17; I have not seen this wound and almost a month however there is nothing really positive to say about it. Necrotic tissue over the surface which superiorly I think abuts on her sacrum. She has surrounding erythema. I would be surprised if there is not underlying osteomyelitis or soft tissue infection. 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 She apparently eats well per description although I wonder about this looking at her. Lab work I did probably 4 weeks ago however was really quite normal including a serum albumin 05/26/17; culture I did last week grew Escherichia coli and methicillin sensitive staph aureus which should've been well covered by the Augmentin and ciprofloxacin. Indeed the erythema around the wound in the bed of the wound looks somewhat better. Her intake is still satisfactory. They now have a near fluidized bed 06/02/17; they completed the antibiotics last Friday. Using collagen. Daughter still reports eating and drinking well. There is less visualized erythema around the wound. 06/16/17; large pressure ulcer over her lower sacrum and coccyx. Using [name] to the wound bed. 06/30/17; certainly no change in dimensions of this large stage III wound over her sacrum and coccyx. They've been using [name]. There is no exposed bone. She has a candidal/tinea area in the right inguinal area. Other than that her daughter relates that she is eating and drinking well there changing her positioning to make sure the areas offloaded 07/14/17; no major change in the dimensions of this large stage III wound. Initially a smaller wound  that became secondarily infected causing significant tissue breakdown although it is been stable in the last several weeks. Tunneling superiorly at roughly 1:00 no change here either. There is no bone palpable. Both the patient's daughter and caretaker states that she eats well. I have not rechecked her blood work 07/27/17; patient arrives in clinic today and generally a deteriorated looking state. Mild fever with axillary temperature of 100.4. Daughter reports she has not been eating and drinking well since yesterday. She looks more pale and thin and less responsive. We have been using silver collagen to her wound 08/11/17; since the last time the patient was here things have gone better. Her fever went down and she started eating and drinking again. Culture I did of the wound showed methicillin sensitive staph aureus, Morganella and enterococcus. I only treated her with Keflex which would've not covered the Morganella and enterococcus however the purulent area on the 2:00 side of her wound is a lot better and the bandlike erythema that concern me also was resolved. I'd called the daughter last week to confirm that she was a lot better. She finished the Keflex last Thursday she also suffered a skin tear this morning perhaps while putting on her incontinence brief period is on the lateral aspect of  her right leg. Clean wound with the surface epithelium not viable. 08/25/17; last week the patient was noted to have erythema around the wound margin and a slight fever which the patient's daughter says was 65. Our office was contacted by home health however we did not have a space to work the patient in that she went to see her primary physician Dr. Maurice March. She was not febrile during this visit on 08/21/17 there was erythema around the wound similar to last occasion. Dr. Maurice March in reference to my culture from 07/28/17 and put her on doxycycline capsules which they're opening twice a day for 10  days. 09/17/17; patient arrives today with the wound bed looking fairly well granulated. There is undermining from 4 to 6:00 although this seems to of contracted slightly. She does not have obvious infection although the daughter states there was some darkening of the wound circumference that is not evident today. They state she is eating well. They are concerned about oral thrush 09/30/16; very fibrin looking granulated wound bed. Her undermining from 4 to 6:00 is about the same but also appears to be well granulated. She has rolled edges of senescent tissue from roughly 7 to 12:00. There is no evidence of infection [name], [name] ([id number]) 10/13/17 on evaluation today patient appears to be doing fairly well all things considered in regard to her sacral wound. There's really not a lot of significant change or improvement she does have some evidence of contusion and deep tissue injury around the left border of the wound that patient's daughter did inquire about today. Nonetheless overall the wound appears to be doing about the same in my opinion. There is no significant indication of infection there also is no significant slough noted at this point. 10/27/17 She is here in follow up evaluation of a sacral ulcer. She is accompanied by her daughter and caregiver. There is red granulation tissue throughout, persistent discoloration to left border; this appears consistent with deep tissue injury. There are multiple areas covered in foam borders, tegaderm, etc that are "preventative" with "no wounds". We will continue with prisma and continue with two week follow ups 11/17/17 on evaluation today patient appears to be doing decently well in regard to her wound at this point. She continues to have a sacral wound ulcer. We see her roughly every two weeks. In the last week she was actually in the hospital due to what was diagnosed as sepsis although no organisms were ever identified in the actual blood  cultures. The hospital really was not sure that the wound was the cause of the infection but they really did not figure out anything else that would be a causative organism she did have a CT scan and that revealed no evidence of pneumonia there was also no evidence of urinary tract infection. Again it very well could have been the wound called in those although wound appears to be doing fairly well at this point. 12/01/17 on evaluation today patient's wound actually appears to be doing about the same at this point. There's no evidence of infection at this time which is good news. With that being said there also really does not appear to be any significant improvement overall in her wound. She has hyper granulation noted at places but overall size wise I do not feel it has dramatically improved she does have a rash in the periwound location the seem to be due to the adhesive at this point. No fevers, chills, nausea, or vomiting noted  at this time. Electronic Signature(s) Signed: 12/01/2017 7:21:18 PM By: Lenda Kelp PA-C Entered By: Lenda Kelp on 12/01/2017 19:02:10 [name] ([id number]) -------------------------------------------------------------------------------- Physical Exam Details Patient Name: [name] Date of Service: 12/01/2017 10:15 AM Medical Record Number: [id number] Patient Account Number: [id number] Date of Birth/Sex: 05/12/1923 (84 y.o. Female) Treating RN: Ashok Cordia, Debi Primary Care Provider: Vonita Moss Other Clinician: Referring Provider: Vonita Moss Treating Provider/Extender: STONE III, Bettyjean Stefanski Weeks in Treatment: 46 Constitutional Chronically ill appearing but in no apparent acute distress. Respiratory normal breathing without difficulty. clear to auscultation bilaterally. Cardiovascular regular rate and rhythm with normal S1, S2. Psychiatric this patient is able to make decisions and demonstrates good insight into disease process. Alert  and Oriented x 3. pleasant and cooperative. Notes Patient at this point seems to be doing rather well as far as the wound is concerned at least feel like this is stable and there does not appear to be any evidence of infection. Due to the significant undermining and Epiboly I'm unsure if this is something that is really going to improve dramatically without surgical intervention and again I do not feel she's a surgical candidate at this point. Electronic Signature(s) Signed: 12/01/2017 7:21:18 PM By: Lenda Kelp PA-C Entered By: Lenda Kelp on 12/01/2017 19:02:57 [name] ([id number]) -------------------------------------------------------------------------------- Physician Orders Details Patient Name: [name] Date of Service: 12/01/2017 10:15 AM Medical Record Number: [id number] Patient Account Number: [id number] Date of Birth/Sex: 04-03-1923 (84 y.o. Female) Treating RN: Ashok Cordia, Debi Primary Care Provider: Vonita Moss Other Clinician: Referring Provider: Vonita Moss Treating Provider/Extender: Linwood Dibbles, Jianni Batten Weeks in Treatment: 52 Verbal / Phone Orders: Yes Clinician: Ashok Cordia, Debi Read Back and Verified: Yes Diagnosis Coding ICD-10 Coding Code Description L89.153 Pressure ulcer of sacral region, stage 3 G20 Parkinson's disease L03.312 Cellulitis of back [any part except buttock] S81.811D Laceration without foreign body, right lower leg, subsequent encounter Wound Cleansing Wound #1 Midline Coccyx o Clean wound with Normal Saline. o May Shower, gently pat wound dry prior to applying new dressing. Anesthetic (add to Medication List) Wound #1 Midline Coccyx o Topical Lidocaine 4% cream applied to wound bed prior to debridement (In Clinic Only). Skin Barriers/Peri-Wound Care Wound #1 Midline Coccyx o Skin Prep - use only where the allevyn sticks not on reddened areas o Triamcinolone Acetonide Ointment - only in clinic o Other: -  hydrocortisone on periwound on the reddened areas Primary Wound Dressing Wound #1 Midline Coccyx o Prisma Ag - pack into undermining, may require 2 pieces of prisma ag for wound packing Secondary Dressing Wound #1 Midline Coccyx o Dry Gauze o Boardered Foam Dressing - HHRN PLEASE ORDER ALLEVYN LIFE BORDERED FOAM FOR PATIENT (OFF BRANDS IRRITATE HER SKIN) Dressing Change Frequency Wound #1 Midline Coccyx o Change dressing every day. Follow-up Appointments o Return Appointment in 2 weeks. Off-Loading Wound #1 Midline Coccyx LLOYD, CULLINAN. ([id number]) o Roho cushion for wheelchair o Mattress - fluidized air mattress o Turn and reposition every 2 hours Additional Orders / Instructions Wound #1 Midline Coccyx o Increase protein intake. - please add protein supplements to patients diet o Other: - please add vitamin A, vitamin C and zinc supplements to patients diet Home Health Wound #1 Midline Coccyx o Continue Home Health Visits - [facility] o Home Health Nurse may visit PRN to address patientos wound care needs. o FACE TO FACE ENCOUNTER: MEDICARE and MEDICAID PATIENTS: I certify that this patient is under my care and that I had a  face-to-face encounter that meets the physician face-to-face encounter requirements with this patient on this date. The encounter with the patient was in whole or in part for the following MEDICAL CONDITION: (primary reason for Home Healthcare) MEDICAL NECESSITY: I certify, that based on my findings, NURSING services are a medically necessary home health service. HOME BOUND STATUS: I certify that my clinical findings support that this patient is homebound (i.e., Due to illness or injury, pt requires aid of supportive devices such as crutches, cane, wheelchairs, walkers, the use of special transportation or the assistance of another person to leave their place of residence. There is a normal inability to leave the home and doing so  requires considerable and taxing effort. Other absences are for medical reasons / religious services and are infrequent or of short duration when for other reasons). o If current dressing causes regression in wound condition, may D/C ordered dressing product/s and apply Normal Saline Moist Dressing daily until next Wound Healing Center / Other MD appointment. Notify Wound Healing Center of regression in wound condition at [phone]. o Please direct any NON-WOUND related issues/requests for orders to patient's Primary Care Physician - Dr Vonita Moss Patient Medications Allergies: Phenergan, Sulfa (Sulfonamide Antibiotics) Notifications Medication Indication Start End lidocaine DOSE 1 - topical 4 % cream - 1 cream topical Electronic Signature(s) Signed: 12/01/2017 7:21:18 PM By: Lenda Kelp PA-C Signed: 12/02/2017 4:15:20 PM By: Alejandro Mulling Entered By: Alejandro Mulling on 12/01/2017 10:57:19 [name] ([id number]) -------------------------------------------------------------------------------- Prescription 12/01/2017 Patient Name: [name] Provider: Lenda Kelp PA-C Date of Birth: December 20, 1922 NPI#: [id number] Sex: F DEA#: [id number] Phone #: [phone] License #: Patient Address: [facility] [address] [facility] [place], [place] [address] [address] [place], [place] [address] [phone] Allergies Phenergan Sulfa (Sulfonamide Antibiotics) Medication Medication: Route: Strength: Form: lidocaine 4 % topical cream topical 4% cream Class: TOPICAL LOCAL ANESTHETICS Dose: Frequency / Time: Indication: 1 1 cream topical Number of Refills: Number of Units: 0 Generic Substitution: Start Date: End Date: One Time Use: Substitution Permitted No Note to Pharmacy: Signature(s): Date(s): Electronic Signature(s) Signed: 12/01/2017 7:21:18 PM By: Lenda Kelp PA-C Signed:  12/02/2017 4:15:20 PM By: Alejandro Mulling Entered By: Alejandro Mulling on 12/01/2017 10:57:21 [name] ([id number]) [name] ([id number]) --------------------------------------------------------------------------------  Problem List Details Patient Name: [name] Date of Service: 12/01/2017 10:15 AM Medical Record Number: [id number] Patient Account Number: [id number] Date of Birth/Sex: 1922/12/29 (84 y.o. Female) Treating RN: Ashok Cordia, Debi Primary Care Provider: Vonita Moss Other Clinician: Referring Provider: Vonita Moss Treating Provider/Extender: Linwood Dibbles, Ted Leonhart Weeks in Treatment: 10 Active Problems ICD-10 Encounter Code Description Active Date Diagnosis L89.153 Pressure ulcer of sacral region, stage 3 01/07/2017 Yes G20 Parkinson's disease 01/07/2017 Yes L03.312 Cellulitis of back [any part except buttock] 07/28/2017 Yes S81.811D Laceration without foreign body, right lower leg, subsequent 08/11/2017 Yes encounter Inactive Problems Resolved Problems Electronic Signature(s) Signed: 12/01/2017 7:21:18 PM By: Lenda Kelp PA-C Entered By: Lenda Kelp on 12/01/2017 10:43:53 [name] ([id number]) -------------------------------------------------------------------------------- Progress Note Details Patient Name: [name] Date of Service: 12/01/2017 10:15 AM Medical Record Number: [id number] Patient Account Number: [id number] Date of Birth/Sex: 06/04/1923 (84 y.o. Female) Treating RN: Ashok Cordia, Debi Primary Care Provider: Vonita Moss Other Clinician: Referring Provider: Vonita Moss Treating Provider/Extender: Linwood Dibbles, Celestina Gironda Weeks in Treatment: 46 Subjective Chief Complaint Information obtained from Patient patient is here for follow up evaluation of a sacral pressure ulcer  History of Present Illness (HPI) 01/07/17 this is a 84 year old woman admitted to the clinic today for review of a pressure ulcer on her lower  sacrum. She is referred from her primary physician's office after being seen on 3/22 with a 3 cm pressure area. Her daughter and caretaker accompanied her today state that the area first became obvious about a month ago and his since deteriorated. They have recently got [name] a home health involved and have been using [name] to the wound. They have ordered a pressure relief surface for her mattress. They are turning her religiously. They state that she eats well and they've been forcing fluids on her. She is on a multivitamin. Looking through [facility] point last albumin I see was 4.4 on 10/28. The patient has advanced parkinsonism which looks superficially like advanced Parkinson's disease although her daughter tells me she did not ever respond to Sinemet therefore this may have another pathology with signs of parkinsonism. However I think this is largely a mute point currently. She also has dementia and is nonambulatory. Since this started they have been keeping her in bed and turning her religiously every 2 hours. She lives at home in [place] with her husband with 24/7 care giving 01/13/17 santyl change qd. Still will require further debridement. continue santyl. 01/20/17; patient's wound actually looks some better less adherent necrotic surface. There is actually visible granulation. We're using [name] 01/27/17; better-looking surface but still a lot of necrotic tissue on the base of this wound. The periwound erythema is better than last week we are still using [name]. Her daughter tells [place] that she is still having trouble with the pressure-relief mattress through medical modalities 02/03/18; I had the patient scheduled for a two week followup however her daughter brought her in early concerned for discoloration on 2 areas of the wound circumference. We have bee using santyl 02/10/17;Better looking surface to the wound. Rim appears better suggesting better offloading. Using santyl 02/24/17;  change to Silver Collegen last time. Wound appears better. 03/10/17; still using silver collagen religious offloading. Intake is satisfactory per her daughter. Dimension slightly better 03/12/2017 -- Dr. Jannetta Quint patient who had been seen 2 days ago and was doing fairly well. The patient is brought in by her daughter who noticed a new wound just above the previous wound on her sacral area and going on more to the left lateral side. She was very concerned and we asked her to get in for an opinion. 03/17/17; above is noted. The patient has developed a progressive area to the left of her original wound. This seems to this started with a ring of red skin with a more pale interior almost looking fungal. There was a rim of blister through part of the area although this did not look like zoster. They have been applying triamcinolone that was prescribed last week by Dr. Meyer Russel and the area has a fold to a linear band area which is confluent, erythematous and with obvious epidermal swelling but there is no overt tenderness or crepitus. She has lost some surface epithelium closer to the wound surface itself and now has a more superficial wound in this area and the extending erythema goes towards the left buttock. This is well demarcated between involved in normal skin but once again does not appear to be at all tender. If there is a contact issue here I cannot get the history out of the daughter or the caregiver that are with her. 03/24/17; the patient arrives today with  the wound slightly worse slightly more drainage. The bandlike area of erythema that I treated as a possible pineal infection has improved somewhat although proximally is still has confluent erythema without overt tenderness. We have been using silver alginate since the most recent deterioration. To the bandlike degree of erythema we have been using Lotrisone cream 03/31/17; patient arrives today with the wound slightly larger, necrotic surface and  surrounding erythema. The bandlike area of erythema that I treated as a possible pineal infection is less swollen but still present I've been using Lotrisone cream on that largely related to the presence of a tinea looking infection when this was first seen. We've been using silver alginate. [name], [name] ([id number]) X-ray that I ordered last week showed no acute bony abnormalities mild fecal impaction. Lab work showed a comprehensive metabolic panel that was normal including an albumin of 3.8. White count was 9.5 hemoglobin 12.3 differential count normal. C-reactive protein was less than 1 and sedimentation rate and 17. The latter 2 values does not support an ongoing bacterial infection. 04/14/17; patient arrives after a 2 week hiatus. Her wound is not in good condition. Although the base of the wound looks stable she still has an erythematous area that was apparently blistered over the weekend. This again points to the left. As our intake nurse pointed out today this is in the area where the tissue folds together and we may need to prevent try to prevent this. Lab work and x-ray that I did to 3 weeks ago were unremarkable including her albumin nevertheless she is an extremely frail condition physically. We have have been using [name] 04/22/17; I changed her to silver alginate because of the surrounding maceration and moisture last week. The daughter did not like the way the wound looked in the middle of the week and changed her back to [place]. They're putting gauze on top of this. Thinks still using Lotrisone. 05/06/17 on evaluation today patient sacral wound appears to be doing okay and does not seem to be any worse. She is having no significant pain during evaluation today the secondary to mental status she is unable to rate or describe whether she had any pain she was not however flinching. Her daughter states that the wound does appear to be looking better to her. Still we are having  difficulty with the skinfold that seems to be closing in on itself at this point. All in all I feel like she is making some good progress in the [name] seems to be the official for her. They do tell me that a refill if we are gonna continue that today. No fevers, chills, nausea, or vomiting noted at this time. 05/13/17 presents today for evaluation concerning her ongoing sacral pressure ulcer. Unfortunately she also has an area of deep tissue injury in the right Ischial region which is starting to show up. The sacral wound also continues to show signs of necrotic tissue overlying and has declined. Overall we really have not seen a significant improvement in the past several months in regard to the sacral wound and now patient is starting to develop a new wound in the right Ischial region. Obviously this is not trending in the direction that we want to see. No fevers, chills, nausea, or vomiting noted at this time. 05/19/17; I have not seen this wound and almost a month however there is nothing really positive to say about it. Necrotic tissue over the surface which superiorly I think abuts on her sacrum. She  has surrounding erythema. I would be surprised if there is not underlying osteomyelitis or soft tissue infection. 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 She apparently eats well per description although I wonder about this looking at her. Lab work I did probably 4 weeks ago however was really quite normal including a serum albumin 05/26/17; culture I did last week grew Escherichia coli and methicillin sensitive staph aureus which should've been well covered by the Augmentin and ciprofloxacin. Indeed the erythema around the wound in the bed of the wound looks somewhat better. Her intake is still satisfactory. They now have a near fluidized bed 06/02/17; they completed the antibiotics last Friday. Using collagen. Daughter still reports eating and drinking well. There is less visualized  erythema around the wound. 06/16/17; large pressure ulcer over her lower sacrum and coccyx. Using [name] to the wound bed. 06/30/17; certainly no change in dimensions of this large stage III wound over her sacrum and coccyx. They've been using [name]. There is no exposed bone. She has a candidal/tinea area in the right inguinal area. Other than that her daughter relates that she is eating and drinking well there changing her positioning to make sure the areas offloaded 07/14/17; no major change in the dimensions of this large stage III wound. Initially a smaller wound that became secondarily infected causing significant tissue breakdown although it is been stable in the last several weeks. Tunneling superiorly at roughly 1:00 no change here either. There is no bone palpable. Both the patient's daughter and caretaker states that she eats well. I have not rechecked her blood work 07/27/17; patient arrives in clinic today and generally a deteriorated looking state. Mild fever with axillary temperature of 100.4. Daughter reports she has not been eating and drinking well since yesterday. She looks more pale and thin and less responsive. We have been using silver collagen to her wound 08/11/17; since the last time the patient was here things have gone better. Her fever went down and she started eating and drinking again. Culture I did of the wound showed methicillin sensitive staph aureus, Morganella and enterococcus. I only treated her with Keflex which would've not covered the Morganella and enterococcus however the purulent area on the 2:00 side of her wound is a lot better and the bandlike erythema that concern me also was resolved. I'd called the daughter last week to confirm that she was a lot better. She finished the Keflex last Thursday she also suffered a skin tear this morning perhaps while putting on her incontinence brief period is on the lateral aspect of her right leg. Clean wound with the  surface epithelium not viable. 08/25/17; last week the patient was noted to have erythema around the wound margin and a slight fever which the patient's daughter says was 13. Our office was contacted by home health however we did not have a space to work the patient in that she went to see her primary physician Dr. Maurice March. She was not febrile during this visit on 08/21/17 there was erythema around the wound similar to last occasion. Dr. Maurice March in reference to my culture from 07/28/17 and put her on doxycycline capsules which they're opening twice a day for 10 days. [name], [name] ([id number]) 09/17/17; patient arrives today with the wound bed looking fairly well granulated. There is undermining from 4 to 6:00 although this seems to of contracted slightly. She does not have obvious infection although the daughter states there was some darkening of the wound circumference  that is not evident today. They state she is eating well. They are concerned about oral thrush 09/30/16; very fibrin looking granulated wound bed. Her undermining from 4 to 6:00 is about the same but also appears to be well granulated. She has rolled edges of senescent tissue from roughly 7 to 12:00. There is no evidence of infection 10/13/17 on evaluation today patient appears to be doing fairly well all things considered in regard to her sacral wound. There's really not a lot of significant change or improvement she does have some evidence of contusion and deep tissue injury around the left border of the wound that patient's daughter did inquire about today. Nonetheless overall the wound appears to be doing about the same in my opinion. There is no significant indication of infection there also is no significant slough noted at this point. 10/27/17 She is here in follow up evaluation of a sacral ulcer. She is accompanied by her daughter and caregiver. There is red granulation tissue throughout, persistent discoloration to left border;  this appears consistent with deep tissue injury. There are multiple areas covered in foam borders, tegaderm, etc that are "preventative" with "no wounds". We will continue with prisma and continue with two week follow ups 11/17/17 on evaluation today patient appears to be doing decently well in regard to her wound at this point. She continues to have a sacral wound ulcer. We see her roughly every two weeks. In the last week she was actually in the hospital due to what was diagnosed as sepsis although no organisms were ever identified in the actual blood cultures. The hospital really was not sure that the wound was the cause of the infection but they really did not figure out anything else that would be a causative organism she did have a CT scan and that revealed no evidence of pneumonia there was also no evidence of urinary tract infection. Again it very well could have been the wound called in those although wound appears to be doing fairly well at this point. 12/01/17 on evaluation today patient's wound actually appears to be doing about the same at this point. There's no evidence of infection at this time which is good news. With that being said there also really does not appear to be any significant improvement overall in her wound. She has hyper granulation noted at places but overall size wise I do not feel it has dramatically improved she does have a rash in the periwound location the seem to be due to the adhesive at this point. No fevers, chills, nausea, or vomiting noted at this time. Patient History Unable to Obtain Patient History due to Altered Mental Status. Information obtained from Patient. Social History Never smoker, Marital Status - Married, Alcohol Use - Never, Drug Use - No History, Caffeine Use - Never. Medical And Surgical History Notes Ear/Nose/Mouth/Throat dysphagia Neurologic parkinsons, tremor Review of Systems (ROS) Constitutional Symptoms (General Health) Denies  complaints or symptoms of Fever, Chills. Respiratory The patient has no complaints or symptoms. Cardiovascular The patient has no complaints or symptoms. Psychiatric The patient has no complaints or symptoms. [name], [name] ([id number]) Objective Constitutional Chronically ill appearing but in no apparent acute distress. Vitals Time Taken: 10:24 AM, Height: 60 in, Weight: 100 lbs, BMI: 19.5, Temperature: 98.1 F, Pulse: 66 bpm, Respiratory Rate: 14 breaths/min, Blood Pressure: 157/91 mmHg. Respiratory normal breathing without difficulty. clear to auscultation bilaterally. Cardiovascular regular rate and rhythm with normal S1, S2. Psychiatric this patient is able to make  decisions and demonstrates good insight into disease process. Alert and Oriented x 3. pleasant and cooperative. General Notes: Patient at this point seems to be doing rather well as far as the wound is concerned at least feel like this is stable and there does not appear to be any evidence of infection. Due to the significant undermining and Epiboly I'm unsure if this is something that is really going to improve dramatically without surgical intervention and again I do not feel she's a surgical candidate at this point. Integumentary (Hair, Skin) Wound #1 status is Open. Original cause of wound was Pressure Injury. The wound is located on the Midline Coccyx. The wound measures 4.8cm length x 4cm width x 0.4cm depth; 15.08cm^2 area and 6.032cm^3 volume. There is muscle and Fat Layer (Subcutaneous Tissue) Exposed exposed. There is no tunneling noted, however, there is undermining starting at 5:00 and ending at 7:00 with a maximum distance of 1.3cm. There is additional undermining and at 12:00 and ending at 3:00 with a maximum distance of 2.4cm. There is a large amount of purulent drainage noted. Foul odor after cleansing was noted. The wound margin is flat and intact. There is medium (34-66%) red granulation within the  wound bed. There is a medium (34- 66%) amount of necrotic tissue within the wound bed including Adherent Slough. The periwound skin appearance exhibited: Induration, Erythema. The periwound skin appearance did not exhibit: Callus, Crepitus, Excoriation, Rash, Scarring, Dry/Scaly, Maceration, Atrophie Blanche, Cyanosis, Ecchymosis, Hemosiderin Staining, Mottled, Pallor, Rubor. The surrounding wound skin color is noted with erythema which is circumferential. Periwound temperature was noted as No Abnormality. The periwound has tenderness on palpation. Assessment Active Problems ICD-10 L89.153 - Pressure ulcer of sacral region, stage 3 G20 - Parkinson's disease L03.312 - Cellulitis of back [any part except buttock] S81.811D - Laceration without foreign body, right lower leg, subsequent encounter [name], [name]. ([id number]) Plan Wound Cleansing: Wound #1 Midline Coccyx: Clean wound with Normal Saline. May Shower, gently pat wound dry prior to applying new dressing. Anesthetic (add to Medication List): Wound #1 Midline Coccyx: Topical Lidocaine 4% cream applied to wound bed prior to debridement (In Clinic Only). Skin Barriers/Peri-Wound Care: Wound #1 Midline Coccyx: Skin Prep - use only where the allevyn sticks not on reddened areas Triamcinolone Acetonide Ointment - only in clinic Other: - hydrocortisone on periwound on the reddened areas Primary Wound Dressing: Wound #1 Midline Coccyx: Prisma Ag - pack into undermining, may require 2 pieces of prisma ag for wound packing Secondary Dressing: Wound #1 Midline Coccyx: Dry Gauze Boardered Foam Dressing - HHRN PLEASE ORDER ALLEVYN LIFE BORDERED FOAM FOR PATIENT (OFF BRANDS IRRITATE HER SKIN) Dressing Change Frequency: Wound #1 Midline Coccyx: Change dressing every day. Follow-up Appointments: Return Appointment in 2 weeks. Off-Loading: Wound #1 Midline Coccyx: Roho cushion for wheelchair Mattress - fluidized air mattress Turn  and reposition every 2 hours Additional Orders / Instructions: Wound #1 Midline Coccyx: Increase protein intake. - please add protein supplements to patients diet Other: - please add vitamin A, vitamin C and zinc supplements to patients diet Home Health: Wound #1 Midline Coccyx: Continue Home Health Visits - [facility] Nurse may visit PRN to address patient s wound care needs. FACE TO FACE ENCOUNTER: MEDICARE and MEDICAID PATIENTS: I certify that this patient is under my care and that I had a face-to-face encounter that meets the physician face-to-face encounter requirements with this patient on this date. The encounter with the patient was in whole or in part for  the following MEDICAL CONDITION: (primary reason for Home Healthcare) MEDICAL NECESSITY: I certify, that based on my findings, NURSING services are a medically necessary home health service. HOME BOUND STATUS: I certify that my clinical findings support that this patient is homebound (i.e., Due to illness or injury, pt requires aid of supportive devices such as crutches, cane, wheelchairs, walkers, the use of special transportation or the assistance of another person to leave their place of residence. There is a normal inability to leave the home and doing so requires considerable and taxing effort. Other absences are for medical reasons / religious services and are infrequent or of short duration when for other reasons). If current dressing causes regression in wound condition, may D/C ordered dressing product/s and apply Normal Saline Moist Dressing daily until next Wound Healing Center / Other MD appointment. Notify Wound Healing Center of regression in wound condition at [phone]. Please direct any NON-WOUND related issues/requests for orders to patient's Primary Care Physician - Dr Vonita Moss The following medication(s) was prescribed: lidocaine topical 4 % cream 1 1 cream topical was prescribed at  facility [name], [name]. ([id number]) I am going to recommend that we continue with the Current wound care measures for the next week. We will discontinue the skin prep over the entire periwound only using this an area where the Allevyn needs to hear and we are going to use our encounter hydrocortisone cream over the area of irritation during the time that they spend "drying out the wound" which takes about an hour so that this will hopefully help with the rash. We will see were things stand at follow-up. Please see above for specific wound care orders. We will see patient for re-evaluation in 2 week(s) here in the clinic. If anything worsens or changes patient will contact our office for additional recommendations. Electronic Signature(s) Signed: 12/01/2017 7:21:18 PM By: Lenda Kelp PA-C Entered By: Lenda Kelp on 12/01/2017 19:03:47 [name] ([id number]) -------------------------------------------------------------------------------- ROS/PFSH Details Patient Name: [name] Date of Service: 12/01/2017 10:15 AM Medical Record Number: [id number] Patient Account Number: [id number] Date of Birth/Sex: 1922/11/11 (84 y.o. Female) Treating RN: Ashok Cordia, Debi Primary Care Provider: Vonita Moss Other Clinician: Referring Provider: Vonita Moss Treating Provider/Extender: Linwood Dibbles, Kollyn Lingafelter Weeks in Treatment: 44 Unable to Obtain Patient History due to oo Altered Mental Status Information Obtained From Patient Wound History Do you currently have one or more open woundso Yes How many open wounds do you currently haveo 1 Approximately how long have you had your woundso 1 week How have you been treating your wound(s) until nowo santyl and gauze Has your wound(s) ever healed and then re-openedo No Have you had any lab work done in the past montho No Have you tested positive for an antibiotic resistant organism (MRSA, VRE)o No Have you tested positive for osteomyelitis  (bone infection)o No Have you had any tests for circulation on your legso No Constitutional Symptoms (General Health) Complaints and Symptoms: Negative for: Fever; Chills Eyes Medical History: Negative for: Cataracts; Glaucoma; Optic Neuritis Ear/Nose/Mouth/Throat Medical History: Negative for: Chronic sinus problems/congestion; Middle ear problems Past Medical History Notes: dysphagia Hematologic/Lymphatic Medical History: Positive for: Anemia Negative for: Hemophilia; Human Immunodeficiency Virus; Lymphedema; Sickle Cell Disease Respiratory Complaints and Symptoms: No Complaints or Symptoms Medical History: Negative for: Aspiration; Asthma; Chronic Obstructive Pulmonary Disease (COPD); Pneumothorax; Sleep Apnea; Tuberculosis Cardiovascular SHON, MANSOURI. ([id number]) Complaints and Symptoms: No Complaints or Symptoms Medical History: Positive for: Hypertension Negative for: Angina; Arrhythmia; Congestive Heart  Failure; Coronary Artery Disease; Deep Vein Thrombosis; Hypotension; Myocardial Infarction; Peripheral Arterial Disease; Peripheral Venous Disease; Phlebitis; Vasculitis Gastrointestinal Medical History: Negative for: Cirrhosis ; Colitis; Crohnos; Hepatitis A; Hepatitis B; Hepatitis C Endocrine Medical History: Negative for: Type I Diabetes; Type II Diabetes Genitourinary Medical History: Negative for: End Stage Renal Disease Immunological Medical History: Negative for: Lupus Erythematosus; Raynaudos; Scleroderma Integumentary (Skin) Medical History: Negative for: History of Burn; History of pressure wounds Musculoskeletal Medical History: Negative for: Gout; Rheumatoid Arthritis; Osteoarthritis; Osteomyelitis Neurologic Medical History: Positive for: Dementia Negative for: Neuropathy; Quadriplegia; Paraplegia; Seizure Disorder Past Medical History Notes: parkinsons, tremor Oncologic Medical History: Negative for: Received Chemotherapy; Received  Radiation Psychiatric Complaints and Symptoms: No Complaints or Symptoms Medical History: Negative for: Anorexia/bulimia; Confinement Anxiety NICOLLETTE, WILHELMI ([id number]) Immunizations Pneumococcal Vaccine: Received Pneumococcal Vaccination: Yes Immunization Notes: up to date Implantable Devices Family and Social History Never smoker; Marital Status - Married; Alcohol Use: Never; Drug Use: No History; Caffeine Use: Never; Financial Concerns: No; Food, Clothing or Shelter Needs: No; Support System Lacking: No; Transportation Concerns: No; Advanced Directives: Yes (Not Provided); Patient does not want information on Advanced Directives; Medical Power of Attorney: Yes - dtr (Not Provided) Physician Affirmation I have reviewed and agree with the above information. Electronic Signature(s) Signed: 12/01/2017 7:21:18 PM By: Lenda Kelp PA-C Signed: 12/02/2017 4:15:20 PM By: Alejandro Mulling Entered By: Lenda Kelp on 12/01/2017 19:02:36 [name] ([id number]) -------------------------------------------------------------------------------- SuperBill Details Patient Name: [name] Date of Service: 12/01/2017 Medical Record Number: [id number] Patient Account Number: [id number] Date of Birth/Sex: [birth year] (84 y.o. Female) Treating RN: Ashok Cordia, Debi Primary Care Provider: Vonita Moss Other Clinician: Referring Provider: Vonita Moss Treating Provider/Extender: Linwood Dibbles, Terrelle Ruffolo Weeks in Treatment: 48 Diagnosis Coding ICD-10 Codes Code Description L89.153 Pressure ulcer of sacral region, stage 3 G20 Parkinson's disease L03.312 Cellulitis of back [any part except buttock] S81.811D Laceration without foreign body, right lower leg, subsequent encounter Facility Procedures CPT4 Code: 08657846 Description: 99213 - WOUND CARE VISIT-LEV 3 EST PT Modifier: Quantity: 1 Physician Procedures CPT4 Code: 9629528 Description: 99213 - WC PHYS LEVEL 3 - EST PT ICD-10  Diagnosis Description L89.153 Pressure ulcer of sacral region, stage 3 G20 Parkinson's disease L03.312 Cellulitis of back [any part except buttock] S81.811D Laceration without foreign body, right lower  leg, subseque Modifier: nt encounter Quantity: 1 Electronic Signature(s) Signed: 12/01/2017 7:21:18 PM By: Lenda Kelp PA-C Previous Signature: 12/01/2017 12:38:42 PM Version By: Alejandro Mulling Entered By: Lenda Kelp on 12/01/2017 19:04:11

## 2017-12-04 NOTE — Progress Notes (Signed)
[name], [name] ([id number]) Visit Report for 12/01/2017 Arrival Information Details Patient Name: [name], [name] Date of Service: 12/01/2017 10:15 AM Medical Record Number: [id number] Patient Account Number: [id number] Date of Birth/Sex: 11-20-1922 (84 y.o. Female) Treating RN: Curtis Sites Primary Care Jolena Kittle: Vonita Moss Other Clinician: Referring Kanya Potteiger: Vonita Moss Treating Normalee Sistare/Extender: Linwood Dibbles, HOYT Weeks in Treatment: 46 Visit Information History Since Last Visit Added or deleted any medications: No Patient Arrived: Wheel Chair Any new allergies or adverse reactions: No Arrival Time: 10:23 Had a fall or experienced change in No activities of daily living that may affect Accompanied By: dtr and cg risk of falls: Transfer Assistance: Manual Signs or symptoms of abuse/neglect since No Patient Identification Verified: Yes last visito Secondary Verification Process Completed: Yes Hospitalized since last visit: No Patient Requires Transmission-Based No Has Dressing in Place as Prescribed: Yes Precautions: Pain Present Now: Unable to Patient Has Alerts: No Respond Electronic Signature(s) Signed: 12/02/2017 4:38:42 PM By: Curtis Sites Entered By: Curtis Sites on 12/01/2017 10:24:12 [name] ([id number]) -------------------------------------------------------------------------------- Clinic Level of Care Assessment Details Patient Name: [name] Date of Service: 12/01/2017 10:15 AM Medical Record Number: [id number] Patient Account Number: [id number] Date of Birth/Sex: 01/16/1923 (84 y.o. Female) Treating RN: Ashok Cordia, Debi Primary Care Sabah Zucco: Vonita Moss Other Clinician: Referring Germaine Ripp: Vonita Moss Treating Keyosha Tiedt/Extender: Linwood Dibbles, HOYT Weeks in Treatment: 46 Clinic Level of Care Assessment Items TOOL 4 Quantity Score X - Use when only an EandM is performed on FOLLOW-UP visit 1 0 ASSESSMENTS - Nursing Assessment /  Reassessment X - Reassessment of Co-morbidities (includes updates in patient status) 1 10 X- 1 5 Reassessment of Adherence to Treatment Plan ASSESSMENTS - Tina and Skin Assessment / Reassessment X - Simple Tina Assessment / Reassessment - one Tina 1 5 []  - 0 Complex Tina Assessment / Reassessment - multiple wounds []  - 0 Dermatologic / Skin Assessment (not related to Tina area) ASSESSMENTS - Focused Assessment []  - Circumferential Edema Measurements - multi extremities 0 []  - 0 Nutritional Assessment / Counseling / Intervention []  - 0 Lower Extremity Assessment (monofilament, tuning fork, pulses) []  - 0 Peripheral Arterial Disease Assessment (using hand held doppler) ASSESSMENTS - Ostomy and/or Continence Assessment and Care []  - Incontinence Assessment and Management 0 []  - 0 Ostomy Care Assessment and Management (repouching, etc.) PROCESS - Coordination of Care []  - Simple Patient / Family Education for ongoing care 0 X- 1 20 Complex (extensive) Patient / Family Education for ongoing care X- 1 10 Staff obtains Chiropractor, Records, Test Results / Process Orders X- 1 10 Staff telephones HHA, Nursing Homes / Clarify orders / etc []  - 0 Routine Transfer to another Facility (non-emergent condition) []  - 0 Routine Hospital Admission (non-emergent condition) []  - 0 New Admissions / Manufacturing engineer / Ordering NPWT, Apligraf, etc. []  - 0 Emergency Hospital Admission (emergent condition) X- 1 10 Simple Discharge Coordination [name], LOVINS. ([id number]) []  - 0 Complex (extensive) Discharge Coordination PROCESS - Special Needs []  - Pediatric / Minor Patient Management 0 []  - 0 Isolation Patient Management []  - 0 Hearing / Language / Visual special needs []  - 0 Assessment of Community assistance (transportation, D/C planning, etc.) []  - 0 Additional assistance / Altered mentation []  - 0 Support Surface(s) Assessment (bed, cushion, seat, etc.) INTERVENTIONS -  Tina Cleansing / Measurement X - Simple Tina Cleansing - one Tina 1 5 []  - 0 Complex Tina Cleansing - multiple wounds X- 1 5 Tina Imaging (photographs - any number of  wounds) []  - 0 Tina Tracing (instead of photographs) X- 1 5 Simple Tina Measurement - one Tina []  - 0 Complex Tina Measurement - multiple wounds INTERVENTIONS - Tina Dressings X - Small Tina Dressing one or multiple wounds 1 10 []  - 0 Medium Tina Dressing one or multiple wounds []  - 0 Large Tina Dressing one or multiple wounds X- 1 5 Application of Medications - topical []  - 0 Application of Medications - injection INTERVENTIONS - Miscellaneous []  - External ear exam 0 []  - 0 Specimen Collection (cultures, biopsies, blood, body fluids, etc.) []  - 0 Specimen(s) / Culture(s) sent or taken to Lab for analysis []  - 0 Patient Transfer (multiple staff / Nurse, adult / Similar devices) []  - 0 Simple Staple / Suture removal (25 or less) []  - 0 Complex Staple / Suture removal (26 or more) []  - 0 Hypo / Hyperglycemic Management (close monitor of Blood Glucose) []  - 0 Ankle / Brachial Index (ABI) - do not check if billed separately X- 1 5 Vital Signs Wisher, Dylana W. ([id number]) Has the patient been seen at the hospital within the last three years: Yes Total Score: 105 Level Of Care: New/Established - Level 3 Electronic Signature(s) Signed: 12/02/2017 4:15:20 PM By: Alejandro Mulling Entered By: Alejandro Mulling on 12/01/2017 12:38:38 [name] ([id number]) -------------------------------------------------------------------------------- Encounter Discharge Information Details Patient Name: [name] Date of Service: 12/01/2017 10:15 AM Medical Record Number: [id number] Patient Account Number: [id number] Date of Birth/Sex: 05-18-1923 (84 y.o. Female) Treating RN: Renne Crigler Primary Care Forest Pruden: Vonita Moss Other Clinician: Referring Duane Earnshaw: Vonita Moss Treating  Alinah Sheard/Extender: Linwood Dibbles, HOYT Weeks in Treatment: 30 Encounter Discharge Information Items Discharge Pain Level: 0 Discharge Condition: Stable Ambulatory Status: Wheelchair Discharge Destination: Home Transportation: Private Auto Accompanied By: daughter Schedule Follow-up Appointment: Yes Medication Reconciliation completed and No provided to Patient/Care Shyla Gayheart: Patient Clinical Summary of Care: Declined Electronic Signature(s) Signed: 12/03/2017 11:42:25 AM By: Gwenlyn Perking Entered By: Gwenlyn Perking on 12/01/2017 11:11:13 [name] ([id number]) -------------------------------------------------------------------------------- Lower Extremity Assessment Details Patient Name: [name] Date of Service: 12/01/2017 10:15 AM Medical Record Number: [id number] Patient Account Number: [id number] Date of Birth/Sex: 1922-11-23 (84 y.o. Female) Treating RN: Curtis Sites Primary Care Neysha Criado: Vonita Moss Other Clinician: Referring Conlan Miceli: Vonita Moss Treating Tayven Renteria/Extender: Linwood Dibbles, HOYT Weeks in Treatment: 46 Electronic Signature(s) Signed: 12/02/2017 4:38:42 PM By: Curtis Sites Entered By: Curtis Sites on 12/01/2017 10:34:22 [name] ([id number]) -------------------------------------------------------------------------------- Multi Tina Chart Details Patient Name: [name] Date of Service: 12/01/2017 10:15 AM Medical Record Number: [id number] Patient Account Number: [id number] Date of Birth/Sex: 1923/05/26 (84 y.o. Female) Treating RN: Ashok Cordia, Debi Primary Care Brailon Don: Vonita Moss Other Clinician: Referring Jermika Olden: Vonita Moss Treating Lauria Depoy/Extender: Linwood Dibbles, HOYT Weeks in Treatment: 46 Vital Signs Height(in): 60 Pulse(bpm): 66 Weight(lbs): 100 Blood Pressure(mmHg): 157/91 Body Mass Index(BMI): 20 Temperature(F): 98.1 Respiratory Rate 14 (breaths/min): Photos: [1:No Photos] [N/A:N/A] Tina Location:  [1:Coccyx - Midline] [N/A:N/A] Wounding Event: [1:Pressure Injury] [N/A:N/A] Primary Etiology: [1:Pressure Ulcer] [N/A:N/A] Comorbid History: [1:Anemia, Hypertension, Dementia] [N/A:N/A] Date Acquired: [1:12/01/2016] [N/A:N/A] Weeks of Treatment: [1:46] [N/A:N/A] Tina Status: [1:Open] [N/A:N/A] Measurements L x W x D [1:4.8x4x0.4] [N/A:N/A] (cm) Area (cm) : [1:15.08] [N/A:N/A] Volume (cm) : [1:6.032] [N/A:N/A] % Reduction in Area: [1:-262.90%] [N/A:N/A] % Reduction in Volume: [1:-262.90%] [N/A:N/A] Starting Position 1 [1:5] (o'clock): Ending Position 1 [1:7] (o'clock): Maximum Distance 1 (cm): [1:1.3] Starting Position 2 [1:12] (o'clock): Ending Position 2 [1:3] (o'clock): Maximum Distance 2 (cm): [1:2.4] Undermining: [1:Yes] [N/A:N/A] Classification: [  1:Category/Stage IV] [N/A:N/A] Exudate Amount: [1:Large] [N/A:N/A] Exudate Type: [1:Purulent] [N/A:N/A] Exudate Color: [1:yellow, brown, green] [N/A:N/A] Foul Odor After Cleansing: [1:Yes] [N/A:N/A] Odor Anticipated Due to [1:No] [N/A:N/A] Product Use: Tina Margin: [1:Flat and Intact] [N/A:N/A] Granulation Amount: [1:Medium (34-66%)] [N/A:N/A] Granulation Quality: [1:Red] [N/A:N/A] Necrotic Amount: [1:Medium (34-66%)] [N/A:N/A] Exposed Structures: Fat Layer (Subcutaneous N/A N/A Tissue) Exposed: Yes Muscle: Yes Fascia: No Tendon: No Joint: No Bone: No Epithelialization: None N/A N/A Periwound Skin Texture: Induration: Yes N/A N/A Excoriation: No Callus: No Crepitus: No Rash: No Scarring: No Periwound Skin Moisture: Maceration: No N/A N/A Dry/Scaly: No Periwound Skin Color: Erythema: Yes N/A N/A Atrophie Blanche: No Cyanosis: No Ecchymosis: No Hemosiderin Staining: No Mottled: No Pallor: No Rubor: No Erythema Location: Circumferential N/A N/A Temperature: No Abnormality N/A N/A Tenderness on Palpation: Yes N/A N/A Tina Preparation: Ulcer Cleansing: N/A N/A Rinsed/Irrigated with Saline Topical  Anesthetic Applied: Other: lidocaine 4% Treatment Notes Electronic Signature(s) Signed: 12/02/2017 4:15:20 PM By: Alejandro Mulling Entered By: Alejandro Mulling on 12/01/2017 10:45:50 [name] ([id number]) -------------------------------------------------------------------------------- Multi-Disciplinary Care Plan Details Patient Name: [name] Date of Service: 12/01/2017 10:15 AM Medical Record Number: [id number] Patient Account Number: [id number] Date of Birth/Sex: 10-13-1922 (84 y.o. Female) Treating RN: Ashok Cordia, Debi Primary Care Ivy Meriwether: Vonita Moss Other Clinician: Referring Yancey Pedley: Vonita Moss Treating Micah Barnier/Extender: Linwood Dibbles, HOYT Weeks in Treatment: 14 Active Inactive ` Abuse / Safety / Falls / Self Care Management Nursing Diagnoses: Impaired physical mobility Potential for falls Goals: Patient will remain injury free Date Initiated: 01/07/2017 Target Resolution Date: 04/03/2017 Goal Status: Active Interventions: Assess fall risk on admission and as needed Notes: ` Nutrition Nursing Diagnoses: Potential for alteratiion in Nutrition/Potential for imbalanced nutrition Goals: Patient/caregiver agrees to and verbalizes understanding of need to use nutritional supplements and/or vitamins as prescribed Date Initiated: 01/07/2017 Target Resolution Date: 04/03/2017 Goal Status: Active Interventions: Assess patient nutrition upon admission and as needed per policy Notes: ` Orientation to the [facility] Nursing Diagnoses: Knowledge deficit related to the [facility] program Goals: Patient/caregiver will verbalize understanding of the [facility] Date Initiated: 01/07/2017 Target Resolution Date: 04/03/2017 Goal Status: Active [name], [name] ([id number]) Interventions: Provide education on orientation to the Tina center Notes: ` Pressure Nursing Diagnoses: Knowledge deficit related to causes and risk  factors for pressure ulcer development Goals: Patient will remain free from development of additional pressure ulcers Date Initiated: 01/07/2017 Target Resolution Date: 04/03/2017 Goal Status: Active Interventions: Assess potential for pressure ulcer upon admission and as needed Notes: ` Tina/Skin Impairment Nursing Diagnoses: Impaired tissue integrity Goals: Patient/caregiver will verbalize understanding of skin care regimen Date Initiated: 01/07/2017 Target Resolution Date: 04/03/2017 Goal Status: Active Ulcer/skin breakdown will have a volume reduction of 30% by week 4 Date Initiated: 01/07/2017 Target Resolution Date: 04/03/2017 Goal Status: Active Ulcer/skin breakdown will have a volume reduction of 50% by week 8 Date Initiated: 01/07/2017 Target Resolution Date: 04/03/2017 Goal Status: Active Ulcer/skin breakdown will have a volume reduction of 80% by week 12 Date Initiated: 01/07/2017 Target Resolution Date: 04/03/2017 Goal Status: Active Ulcer/skin breakdown will heal within 14 weeks Date Initiated: 01/07/2017 Target Resolution Date: 04/03/2017 Goal Status: Active Interventions: Assess patient/caregiver ability to obtain necessary supplies Assess patient/caregiver ability to perform ulcer/skin care regimen upon admission and as needed Assess ulceration(s) every visit Notes: Electronic Signature(s) [name], [name] ([id number]) Signed: 12/02/2017 4:15:20 PM By: Alejandro Mulling Entered By: Alejandro Mulling on 12/01/2017 10:45:26 [name] ([id number]) -------------------------------------------------------------------------------- Pain Assessment Details Patient Name: [name],  Tina GipETTALENE W. Date of Service: 12/01/2017 10:15 AM Medical Record Number: [id number] Patient Account Number: [id number] Date of Birth/Sex: 12/22/1922 (84 y.o. Female) Treating RN: Curtis Sitesorthy, Joanna Primary Care Rilen Shukla: Vonita MossRISSMAN, MARK Other Clinician: Referring Shadiamond Koska: Vonita MossRISSMAN, MARK Treating  Chesnee Floren/Extender: Linwood DibblesSTONE III, HOYT Weeks in Treatment: 2146 Active Problems Location of Pain Severity and Description of Pain Patient Has Paino Patient Unable to Respond Site Locations Pain Management and Medication Current Pain Management: Electronic Signature(s) Signed: 12/02/2017 4:38:42 PM By: Curtis Sitesorthy, Joanna Entered By: Curtis Sitesorthy, Joanna on 12/01/2017 10:24:21 [name], [name] ([id number]) -------------------------------------------------------------------------------- Patient/Caregiver Education Details Patient Name: [name], [name] Date of Service: 12/01/2017 10:15 AM Medical Record Number: [id number] Patient Account Number: [id number] Date of Birth/Gender: 12/22/1922 (84 y.o. Female) Treating RN: Renne CriglerFlinchum, Cheryl Primary Care Physician: Vonita MossRISSMAN, MARK Other Clinician: Referring Physician: Vonita MossRISSMAN, MARK Treating Physician/Extender: Skeet SimmerSTONE III, HOYT Weeks in Treatment: 8646 Education Assessment Education Provided To: Patient and Caregiver daughter Education Topics Provided Tina/Skin Impairment: Handouts: Caring for Your Ulcer Methods: Explain/Verbal Responses: State content correctly Electronic Signature(s) Signed: 12/02/2017 4:15:18 PM By: Renne CriglerFlinchum, Cheryl Entered By: Renne CriglerFlinchum, Cheryl on 12/01/2017 11:09:34 [name], [name] ([id number]) -------------------------------------------------------------------------------- Tina Assessment Details Patient Name: [name], [name] Date of Service: 12/01/2017 10:15 AM Medical Record Number: [id number] Patient Account Number: [id number] Date of Birth/Sex: 12/22/1922 (84 y.o. Female) Treating RN: Curtis Sitesorthy, Joanna Primary Care Obed Samek: Vonita MossRISSMAN, MARK Other Clinician: Referring Shawnya Mayor: Vonita MossRISSMAN, MARK Treating Emy Angevine/Extender: Linwood DibblesSTONE III, HOYT Weeks in Treatment: 46 Tina Status Tina Number: 1 Primary Etiology: Pressure Ulcer Tina Location: Coccyx - Midline Tina Status: Open Wounding Event: Pressure Injury Comorbid History: Anemia,  Hypertension, Dementia Date Acquired: 12/01/2016 Weeks Of Treatment: 46 Clustered Tina: No Photos Photo Uploaded By: Curtis Sitesorthy, Joanna on 12/01/2017 11:10:12 Tina Measurements Length: (cm) 4.8 Width: (cm) 4 Depth: (cm) 0.4 Area: (cm) 15.08 Volume: (cm) 6.032 % Reduction in Area: -262.9% % Reduction in Volume: -262.9% Epithelialization: None Tunneling: No Undermining: Yes Location 1 Starting Position (o'clock): 5 Ending Position (o'clock): 7 Maximum Distance: (cm) 1.3 Location 2 Starting Position (o'clock): 12 Ending Position (o'clock): 3 Maximum Distance: (cm) 2.4 Tina Description Classification: Category/Stage IV Foul O Tina Margin: Flat and Intact Due to Exudate Amount: Large Slough Exudate Type: Purulent Exudate Color: yellow, brown, green dor After Cleansing: Yes Product Use: No /Fibrino Yes [name], Tiffony W. ([id number]) Granulation Amount: Medium (34-66%) Exposed Structure Granulation Quality: Red Fascia Exposed: No Necrotic Amount: Medium (34-66%) Fat Layer (Subcutaneous Tissue) Exposed: Yes Necrotic Quality: Adherent Slough Tendon Exposed: No Muscle Exposed: Yes Necrosis of Muscle: No Joint Exposed: No Bone Exposed: No Periwound Skin Texture Texture Color No Abnormalities Noted: No No Abnormalities Noted: No Callus: No Atrophie Blanche: No Crepitus: No Cyanosis: No Excoriation: No Ecchymosis: No Induration: Yes Erythema: Yes Rash: No Erythema Location: Circumferential Scarring: No Hemosiderin Staining: No Mottled: No Moisture Pallor: No No Abnormalities Noted: No Rubor: No Dry / Scaly: No Maceration: No Temperature / Pain Temperature: No Abnormality Tenderness on Palpation: Yes Tina Preparation Ulcer Cleansing: Rinsed/Irrigated with Saline Topical Anesthetic Applied: Other: lidocaine 4%, Treatment Notes Tina #1 (Midline Coccyx) 1. Cleansed with: Clean Tina with Normal Saline 2. Anesthetic Topical Lidocaine 4%  cream to Tina bed prior to debridement 3. Peri-Tina Care: Skin Prep Other peri-Tina care (specify in notes) 4. Dressing Applied: Prisma Ag 5. Secondary Dressing Applied Bordered Foam Dressing Dry Gauze Notes apply TCA ointment to red areas apply skin prep lightly t edge of boarder foam dressing to assist to adherence Electronic Signature(s) Signed: 12/02/2017 4:38:42 PM By: Curtis Sitesorthy, Joanna Entered  By: Curtis Sites on 12/01/2017 10:32:25 [name] ([id number]) -------------------------------------------------------------------------------- Vitals Details Patient Name: [name] Date of Service: 12/01/2017 10:15 AM Medical Record Number: [id number] Patient Account Number: [id number] Date of Birth/Sex: [birth year] (84 y.o. Female) Treating RN: Curtis Sites Primary Care Lacinda Curvin: Vonita Moss Other Clinician: Referring Floyd Wade: Vonita Moss Treating Rosita Guzzetta/Extender: Linwood Dibbles, HOYT Weeks in Treatment: 46 Vital Signs Time Taken: 10:24 Temperature (F): 98.1 Height (in): 60 Pulse (bpm): 66 Weight (lbs): 100 Respiratory Rate (breaths/min): 14 Body Mass Index (BMI): 19.5 Blood Pressure (mmHg): 157/91 Reference Range: 80 - 120 mg / dl Electronic Signature(s) Signed: 12/02/2017 4:38:42 PM By: Curtis Sites Entered By: Curtis Sites on 12/01/2017 10:24:44

## 2017-12-08 ENCOUNTER — Ambulatory Visit: Payer: Medicare Other | Admitting: Physician Assistant

## 2017-12-15 ENCOUNTER — Encounter: Payer: Medicare Other | Admitting: Physician Assistant

## 2017-12-15 ENCOUNTER — Telehealth: Payer: Self-pay | Admitting: Family Medicine

## 2017-12-15 DIAGNOSIS — L89153 Pressure ulcer of sacral region, stage 3: Secondary | ICD-10-CM | POA: Diagnosis not present

## 2017-12-15 MED ORDER — NYSTATIN 100000 UNIT/GM EX POWD: Freq: Four times a day (QID) | CUTANEOUS | 0 refills | Status: DC

## 2017-12-15 NOTE — Telephone Encounter (Signed)
done

## 2017-12-15 NOTE — Telephone Encounter (Signed)
Copied from CRM [id number]. Topic: General - Other >> Dec 15, 2017  8:16 AM Gerrianne ScalePayne, Angela L wrote: Reason for CRM: [facility] [phone] calling stating that pt has a rash under her rt arm and would like Nystatin called into her pharmacy

## 2017-12-17 NOTE — Progress Notes (Signed)
Tina MainlandSHAW, Liviya W. ([id number]) Visit Report for 12/15/2017 Arrival Information Details Patient Name: Tina MainlandSHAW, Tangala W. Date of Service: 12/15/2017 10:15 AM Medical Record Number: [id number] Patient Account Number: [id number] Date of Birth/Sex: 01/28/1923 (84 y.o. Female) Treating RN: Renne CriglerFlinchum, Cheryl Primary Care [name]: Vonita MossRISSMAN, MARK Other Clinician: Referring Mishayla Sliwinski: Vonita MossRISSMAN, MARK Treating Emilee Market/Extender: Linwood DibblesSTONE III, HOYT Weeks in Treatment: 48 Visit Information History Since Last Visit All ordered tests and consults were No Patient Arrived: Wheel completed: Chair Added or deleted any medications: No Arrival Time: 10:19 Any new allergies or adverse reactions: No Accompanied By: daughter Had a fall or experienced change in No Transfer Assistance: Manual activities of daily living that may affect Patient Identification Verified: Yes risk of falls: Secondary Verification Process Completed: Yes Signs or symptoms of abuse/neglect since No Patient Requires Transmission-Based No last visito Precautions: Hospitalized since last visit: No Patient Has Alerts: No Pain Present Now: Unable to Respond Electronic Signature(s) Signed: 12/16/2017 8:00:08 AM By: Renne CriglerFlinchum, Cheryl Entered By: Renne CriglerFlinchum, Cheryl on 12/15/2017 10:20:12 Tina MainlandSHAW, Dandria W. ([id number]) -------------------------------------------------------------------------------- Clinic Level of Care Assessment Details Patient Name: Tina MainlandSHAW, Masey W. Date of Service: 12/15/2017 10:15 AM Medical Record Number: [id number] Patient Account Number: [id number] Date of Birth/Sex: 01/28/1923 (84 y.o. Female) Treating RN: Renne CriglerFlinchum, Cheryl Primary Care Ardine Iacovelli: Vonita MossRISSMAN, MARK Other Clinician: Referring Cyan Clippinger: Vonita MossRISSMAN, MARK Treating Chanler Mendonca/Extender: Linwood DibblesSTONE III, HOYT Weeks in Treatment: 48 Clinic Level of Care Assessment Items TOOL 4 Quantity Score []  - Use when only an EandM is performed on FOLLOW-UP visit 0 ASSESSMENTS -  Nursing Assessment / Reassessment X - Reassessment of Co-morbidities (includes updates in patient status) 1 10 X- 1 5 Reassessment of Adherence to Treatment Plan ASSESSMENTS - Wound and Skin Assessment / Reassessment []  - Simple Wound Assessment / Reassessment - one wound 0 X- 2 5 Complex Wound Assessment / Reassessment - multiple wounds []  - 0 Dermatologic / Skin Assessment (not related to wound area) ASSESSMENTS - Focused Assessment []  - Circumferential Edema Measurements - multi extremities 0 []  - 0 Nutritional Assessment / Counseling / Intervention []  - 0 Lower Extremity Assessment (monofilament, tuning fork, pulses) []  - 0 Peripheral Arterial Disease Assessment (using hand held doppler) ASSESSMENTS - Ostomy and/or Continence Assessment and Care []  - Incontinence Assessment and Management 0 []  - 0 Ostomy Care Assessment and Management (repouching, etc.) PROCESS - Coordination of Care []  - Simple Patient / Family Education for ongoing care 0 X- 1 20 Complex (extensive) Patient / Family Education for ongoing care []  - 0 Staff obtains ChiropractorConsents, Records, Test Results / Process Orders []  - 0 Staff telephones HHA, Nursing Homes / Clarify orders / etc []  - 0 Routine Transfer to another Facility (non-emergent condition) []  - 0 Routine Hospital Admission (non-emergent condition) []  - 0 New Admissions / Manufacturing engineernsurance Authorizations / Ordering NPWT, Apligraf, etc. []  - 0 Emergency Hospital Admission (emergent condition) []  - 0 Simple Discharge Coordination Tina MainlandSHAW, Amada W. ([id number]) X- 1 15 Complex (extensive) Discharge Coordination PROCESS - Special Needs []  - Pediatric / Minor Patient Management 0 []  - 0 Isolation Patient Management []  - 0 Hearing / Language / Visual special needs []  - 0 Assessment of Community assistance (transportation, D/C planning, etc.) []  - 0 Additional assistance / Altered mentation []  - 0 Support Surface(s) Assessment (bed, cushion, seat,  etc.) INTERVENTIONS - Wound Cleansing / Measurement []  - Simple Wound Cleansing - one wound 0 X- 2 5 Complex Wound Cleansing - multiple wounds X- 1 5 Wound Imaging (photographs - any number of wounds) []  - 0  Wound Tracing (instead of photographs) []  - 0 Simple Wound Measurement - one wound X- 2 5 Complex Wound Measurement - multiple wounds INTERVENTIONS - Wound Dressings X - Small Wound Dressing one or multiple wounds 2 10 []  - 0 Medium Wound Dressing one or multiple wounds []  - 0 Large Wound Dressing one or multiple wounds []  - 0 Application of Medications - topical []  - 0 Application of Medications - injection INTERVENTIONS - Miscellaneous []  - External ear exam 0 []  - 0 Specimen Collection (cultures, biopsies, blood, body fluids, etc.) []  - 0 Specimen(s) / Culture(s) sent or taken to Lab for analysis []  - 0 Patient Transfer (multiple staff / Nurse, adult / Similar devices) []  - 0 Simple Staple / Suture removal (25 or less) []  - 0 Complex Staple / Suture removal (26 or more) []  - 0 Hypo / Hyperglycemic Management (close monitor of Blood Glucose) []  - 0 Ankle / Brachial Index (ABI) - do not check if billed separately X- 1 5 Vital Signs [name], [name] ([id number]) Has the patient been seen at the hospital within the last three years: Yes Total Score: 110 Level Of Care: New/Established - Level 3 Electronic Signature(s) Signed: 12/16/2017 8:00:08 AM By: Renne Crigler Entered By: Renne Crigler on 12/15/2017 11:11:30 [name] ([id number]) -------------------------------------------------------------------------------- Encounter Discharge Information Details Patient Name: [name] Date of Service: 12/15/2017 10:15 AM Medical Record Number: [id number] Patient Account Number: [id number] Date of Birth/Sex: [birth year] (84 y.o. Female) Treating RN: Renne Crigler Primary Care Selvin Yun: Vonita Moss Other Clinician: Referring Loni Abdon: Vonita Moss Treating Malene Blaydes/Extender: Linwood Dibbles, HOYT Weeks in Treatment: 48 Encounter Discharge Information Items Discharge Pain Level: 0 Discharge Condition: Stable Ambulatory Status: Wheelchair Discharge Destination: Home Transportation: Private Auto Accompanied By: daughter Schedule Follow-up Appointment: Yes Medication Reconciliation completed and No provided to Patient/Care Barbra Miner: Provided on Clinical Summary of Care: 12/15/2017 Form Type Recipient Paper Patient ES Electronic Signature(s) Signed: 12/16/2017 10:32:15 AM By: Gwenlyn Perking Entered By: Gwenlyn Perking on 12/15/2017 11:15:26 [name] ([id number]) -------------------------------------------------------------------------------- Lower Extremity Assessment Details Patient Name: [name] Date of Service: 12/15/2017 10:15 AM Medical Record Number: [id number] Patient Account Number: [id number] Date of Birth/Sex: [birth year] (84 y.o. Female) Treating RN: Renne Crigler Primary Care Jentri Aye: Vonita Moss Other Clinician: Referring Rhys Anchondo: Vonita Moss Treating Tesa Meadors/Extender: Linwood Dibbles, HOYT Weeks in Treatment: 48 Edema Assessment Assessed: [Left: No] [Right: No] Edema: [Left: N] [Right: o] Vascular Assessment Claudication: Claudication Assessment [Left:None] Pulses: Dorsalis Pedis Palpable: [Left:Yes] Posterior Tibial Extremity colors, hair growth, and conditions: Extremity Color: [Left:Normal] Hair Growth on Extremity: [Left:No] Temperature of Extremity: [Left:Cool] Capillary Refill: [Left:> 3 seconds] Toe Nail Assessment Left: Right: Thick: Yes Discolored: Yes Deformed: No Improper Length and Hygiene: No Electronic Signature(s) Signed: 12/16/2017 8:00:08 AM By: Renne Crigler Entered By: Renne Crigler on 12/15/2017 10:37:03 [name] ([id number]) -------------------------------------------------------------------------------- Multi Wound Chart Details Patient Name:  [name] Date of Service: 12/15/2017 10:15 AM Medical Record Number: [id number] Patient Account Number: [id number] Date of Birth/Sex: [birth year] (84 y.o. Female) Treating RN: Renne Crigler Primary Care Brownie Gockel: Vonita Moss Other Clinician: Referring Melina Mosteller: Vonita Moss Treating Skiler Tye/Extender: STONE III, HOYT Weeks in Treatment: 48 Vital Signs Height(in): 60 Pulse(bpm): 55 Weight(lbs): 100 Blood Pressure(mmHg): 145/72 Body Mass Index(BMI): 20 Temperature(F): 97.6 Respiratory Rate 14 (breaths/min): Photos: [1:No Photos] [5:No Photos] [N/A:N/A] Wound Location: [1:Coccyx - Midline] [5:Left Lower Leg] [N/A:N/A] Wounding Event: [1:Pressure Injury] [5:Pressure Injury] [N/A:N/A] Primary Etiology: [1:Pressure Ulcer] [5:Pressure Ulcer] [N/A:N/A] Comorbid History: [1:Anemia, Hypertension, Dementia] [5:Anemia, Hypertension, Dementia] [  N/A:N/A] Date Acquired: [1:12/01/2016] [5:12/09/2017] [N/A:N/A] Weeks of Treatment: [1:48] [5:0] [N/A:N/A] Wound Status: [1:Open] [5:Open] [N/A:N/A] Measurements L x W x D [1:4.5x3.5x0.4] [5:1.5x1.5x0.1] [N/A:N/A] (cm) Area (cm) : [1:12.37] [5:1.767] [N/A:N/A] Volume (cm) : [1:4.948] [5:0.177] [N/A:N/A] % Reduction in Area: [1:-197.70%] [5:N/A] [N/A:N/A] % Reduction in Volume: [1:-197.70%] [5:N/A] [N/A:N/A] Starting Position 1 [1:11] (o'clock): Ending Position 1 [1:1] (o'clock): Maximum Distance 1 (cm): [1:2.5] Undermining: [1:Yes] [5:No] [N/A:N/A] Classification: [1:Category/Stage IV] [5:Unstageable/Unclassified] [N/A:N/A] Exudate Amount: [1:Large] [5:Medium] [N/A:N/A] Exudate Type: [1:Serosanguineous] [5:Serous] [N/A:N/A] Exudate Color: [1:red, brown] [5:amber] [N/A:N/A] Foul Odor After Cleansing: [1:Yes] [5:No] [N/A:N/A] Odor Anticipated Due to [1:No] [5:N/A] [N/A:N/A] Product Use: Wound Margin: [1:Flat and Intact] [5:Distinct, outline attached] [N/A:N/A] Granulation Amount: [1:Medium (34-66%)] [5:Small (1-33%)]  [N/A:N/A] Granulation Quality: [1:Red] [5:Pink] [N/A:N/A] Necrotic Amount: [1:Medium (34-66%)] [5:Large (67-100%)] [N/A:N/A] Necrotic Tissue: [1:Adherent Slough] [5:Eschar] [N/A:N/A] Exposed Structures: [1:Fat Layer (Subcutaneous Tissue) Exposed: Yes Muscle: Yes Fascia: No] [5:Fascia: No Fat Layer (Subcutaneous Tissue) Exposed: No Tendon: No] [N/A:N/A] Tendon: No Muscle: No Joint: No Joint: No Bone: No Bone: No Epithelialization: None Large (67-100%) N/A Periwound Skin Texture: Induration: Yes Excoriation: No N/A Excoriation: No Induration: No Callus: No Callus: No Crepitus: No Crepitus: No Rash: No Rash: No Scarring: No Scarring: No Periwound Skin Moisture: Maceration: No Maceration: No N/A Dry/Scaly: No Dry/Scaly: No Periwound Skin Color: Erythema: Yes Atrophie Blanche: No N/A Atrophie Blanche: No Cyanosis: No Cyanosis: No Ecchymosis: No Ecchymosis: No Erythema: No Hemosiderin Staining: No Hemosiderin Staining: No Mottled: No Mottled: No Pallor: No Pallor: No Rubor: No Rubor: No Erythema Location: Circumferential N/A N/A Temperature: No Abnormality N/A N/A Tenderness on Palpation: Yes No N/A Wound Preparation: Ulcer Cleansing: Ulcer Cleansing: N/A Rinsed/Irrigated with Saline Rinsed/Irrigated with Saline Topical Anesthetic Applied: Topical Anesthetic Applied: Other: lidocaine 4% None Treatment Notes Electronic Signature(s) Signed: 12/16/2017 8:00:08 AM By: Renne Crigler Entered By: Renne Crigler on 12/15/2017 10:50:10 [name] ([id number]) -------------------------------------------------------------------------------- Multi-Disciplinary Care Plan Details Patient Name: [name] Date of Service: 12/15/2017 10:15 AM Medical Record Number: [id number] Patient Account Number: [id number] Date of Birth/Sex: [birth year] (84 y.o. Female) Treating RN: Renne Crigler Primary Care Ammiel Guiney: Vonita Moss Other Clinician: Referring  Brittin Belnap: Vonita Moss Treating Solae Norling/Extender: Linwood Dibbles, HOYT Weeks in Treatment: 48 Active Inactive ` Abuse / Safety / Falls / Self Care Management Nursing Diagnoses: Impaired physical mobility Potential for falls Goals: Patient will remain injury free Date Initiated: 01/07/2017 Target Resolution Date: 04/03/2017 Goal Status: Active Interventions: Assess fall risk on admission and as needed Notes: ` Nutrition Nursing Diagnoses: Potential for alteratiion in Nutrition/Potential for imbalanced nutrition Goals: Patient/caregiver agrees to and verbalizes understanding of need to use nutritional supplements and/or vitamins as prescribed Date Initiated: 01/07/2017 Target Resolution Date: 04/03/2017 Goal Status: Active Interventions: Assess patient nutrition upon admission and as needed per policy Notes: ` Orientation to the Wound Care Program Nursing Diagnoses: Knowledge deficit related to the wound healing center program Goals: Patient/caregiver will verbalize understanding of the Wound Healing Center Program Date Initiated: 01/07/2017 Target Resolution Date: 04/03/2017 Goal Status: Active AZRIELLA, MATTIA ([id number]) Interventions: Provide education on orientation to the wound center Notes: ` Pressure Nursing Diagnoses: Knowledge deficit related to causes and risk factors for pressure ulcer development Goals: Patient will remain free from development of additional pressure ulcers Date Initiated: 01/07/2017 Target Resolution Date: 04/03/2017 Goal Status: Active Interventions: Assess potential for pressure ulcer upon admission and as needed Notes: ` Wound/Skin Impairment Nursing Diagnoses: Impaired tissue integrity Goals: Patient/caregiver will verbalize understanding of skin care regimen Date Initiated: 01/07/2017  Target Resolution Date: 04/03/2017 Goal Status: Active Ulcer/skin breakdown will have a volume reduction of 30% by week 4 Date Initiated:  01/07/2017 Target Resolution Date: 04/03/2017 Goal Status: Active Ulcer/skin breakdown will have a volume reduction of 50% by week 8 Date Initiated: 01/07/2017 Target Resolution Date: 04/03/2017 Goal Status: Active Ulcer/skin breakdown will have a volume reduction of 80% by week 12 Date Initiated: 01/07/2017 Target Resolution Date: 04/03/2017 Goal Status: Active Ulcer/skin breakdown will heal within 14 weeks Date Initiated: 01/07/2017 Target Resolution Date: 04/03/2017 Goal Status: Active Interventions: Assess patient/caregiver ability to obtain necessary supplies Assess patient/caregiver ability to perform ulcer/skin care regimen upon admission and as needed Assess ulceration(s) every visit Notes: Electronic Signature(s) CRYSTELLE, FERRUFINO ([id number]) Signed: 12/16/2017 8:00:08 AM By: Renne Crigler Entered By: Renne Crigler on 12/15/2017 11:20:11 [name] ([id number]) -------------------------------------------------------------------------------- Pain Assessment Details Patient Name: [name] Date of Service: 12/15/2017 10:15 AM Medical Record Number: [id number] Patient Account Number: [id number] Date of Birth/Sex: [birth year] (84 y.o. Female) Treating RN: Renne Crigler Primary Care Ziza Hastings: Vonita Moss Other Clinician: Referring Eleazar Kimmey: Vonita Moss Treating Kashawn Manzano/Extender: Linwood Dibbles, HOYT Weeks in Treatment: 48 Active Problems Location of Pain Severity and Description of Pain Patient Has Paino Patient Unable to Respond Site Locations Pain Management and Medication Current Pain Management: Electronic Signature(s) Signed: 12/16/2017 8:00:08 AM By: Renne Crigler Entered By: Renne Crigler on 12/15/2017 10:20:21 [name] ([id number]) -------------------------------------------------------------------------------- Patient/Caregiver Education Details Patient Name: [name] Date of Service: 12/15/2017 10:15 AM Medical Record  Number: [id number] Patient Account Number: [id number] Date of Birth/Gender: 1923/01/19 (84 y.o. Female) Treating RN: Renne Crigler Primary Care Physician: Vonita Moss Other Clinician: Referring Physician: Vonita Moss Treating Physician/Extender: Skeet Simmer in Treatment: 41 Education Assessment Education Provided To: Patient Education Topics Provided Wound/Skin Impairment: Handouts: Caring for Your Ulcer Methods: Explain/Verbal Responses: State content correctly Electronic Signature(s) Signed: 12/16/2017 8:00:08 AM By: Renne Crigler Entered By: Renne Crigler on 12/15/2017 11:15:34 [name] ([id number]) -------------------------------------------------------------------------------- Wound Assessment Details Patient Name: [name] Date of Service: 12/15/2017 10:15 AM Medical Record Number: [id number] Patient Account Number: [id number] Date of Birth/Sex: 05-28-1923 (84 y.o. Female) Treating RN: Renne Crigler Primary Care Tameya Kuznia: Vonita Moss Other Clinician: Referring Reece Mcbroom: Vonita Moss Treating Tahjai Schetter/Extender: STONE III, HOYT Weeks in Treatment: 48 Wound Status Wound Number: 1 Primary Etiology: Pressure Ulcer Wound Location: Coccyx - Midline Wound Status: Open Wounding Event: Pressure Injury Comorbid History: Anemia, Hypertension, Dementia Date Acquired: 12/01/2016 Weeks Of Treatment: 48 Clustered Wound: No Photos Photo Uploaded By: Renne Crigler on 12/16/2017 10:16:14 Wound Measurements Length: (cm) 4.5 Width: (cm) 3.5 Depth: (cm) 0.4 Area: (cm) 12.37 Volume: (cm) 4.948 % Reduction in Area: -197.7% % Reduction in Volume: -197.7% Epithelialization: None Tunneling: No Undermining: Yes Starting Position (o'clock): 11 Ending Position (o'clock): 1 Maximum Distance: (cm) 2.5 Wound Description Classification: Category/Stage IV Wound Margin: Flat and Intact Exudate Amount: Large Exudate Type:  Serosanguineous Exudate Color: red, brown Foul Odor After Cleansing: Yes Due to Product Use: No Slough/Fibrino Yes Wound Bed Granulation Amount: Medium (34-66%) Exposed Structure Granulation Quality: Red Fascia Exposed: No Necrotic Amount: Medium (34-66%) Fat Layer (Subcutaneous Tissue) Exposed: Yes Necrotic Quality: Adherent Slough Tendon Exposed: No Muscle Exposed: Yes ADELITA, HONE. ([id number]) Necrosis of Muscle: No Joint Exposed: No Bone Exposed: No Periwound Skin Texture Texture Color No Abnormalities Noted: No No Abnormalities Noted: No Callus: No Atrophie Blanche: No Crepitus: No Cyanosis: No Excoriation: No Ecchymosis: No Induration: Yes Erythema: Yes Rash: No Erythema Location: Circumferential Scarring: No  Hemosiderin Staining: No Mottled: No Moisture Pallor: No No Abnormalities Noted: No Rubor: No Dry / Scaly: No Maceration: No Temperature / Pain Temperature: No Abnormality Tenderness on Palpation: Yes Wound Preparation Ulcer Cleansing: Rinsed/Irrigated with Saline Topical Anesthetic Applied: Other: lidocaine 4%, Treatment Notes Wound #1 (Midline Coccyx) 1. Cleansed with: Clean wound with Normal Saline 2. Anesthetic Topical Lidocaine 4% cream to wound bed prior to debridement 4. Dressing Applied: Prisma Ag 5. Secondary Dressing Applied Bordered Foam Dressing Dry Gauze Notes xeroform gauze to left lower leg and wrap with coban and tape with stretch net. Electronic Signature(s) Signed: 12/16/2017 8:00:08 AM By: Renne Crigler Entered By: Renne Crigler on 12/15/2017 10:33:40 [name] ([id number]) -------------------------------------------------------------------------------- Wound Assessment Details Patient Name: [name] Date of Service: 12/15/2017 10:15 AM Medical Record Number: [id number] Patient Account Number: [id number] Date of Birth/Sex: Feb 04, 1923 (84 y.o. Female) Treating RN: Renne Crigler Primary Care  Lyriq Finerty: Vonita Moss Other Clinician: Referring Tymon Nemetz: Vonita Moss Treating Lazaria Schaben/Extender: STONE III, HOYT Weeks in Treatment: 48 Wound Status Wound Number: 5 Primary Etiology: Pressure Ulcer Wound Location: Left Lower Leg Wound Status: Open Wounding Event: Pressure Injury Comorbid History: Anemia, Hypertension, Dementia Date Acquired: 12/09/2017 Weeks Of Treatment: 0 Clustered Wound: No Photos Photo Uploaded By: Renne Crigler on 12/16/2017 10:16:15 Wound Measurements Length: (cm) 1.5 Width: (cm) 1.5 Depth: (cm) 0.1 Area: (cm) 1.767 Volume: (cm) 0.177 % Reduction in Area: % Reduction in Volume: Epithelialization: Large (67-100%) Tunneling: No Undermining: No Wound Description Classification: Unstageable/Unclassified Wound Margin: Distinct, outline attached Exudate Amount: Medium Exudate Type: Serous Exudate Color: amber Foul Odor After Cleansing: No Slough/Fibrino Yes Wound Bed Granulation Amount: Small (1-33%) Exposed Structure Granulation Quality: Pink Fascia Exposed: No Necrotic Amount: Large (67-100%) Fat Layer (Subcutaneous Tissue) Exposed: No Necrotic Quality: Eschar Tendon Exposed: No Muscle Exposed: No Joint Exposed: No Bone Exposed: No Periwound Skin Texture SIMARA, RHYNER. ([id number]) Texture Color No Abnormalities Noted: No No Abnormalities Noted: No Callus: No Atrophie Blanche: No Crepitus: No Cyanosis: No Excoriation: No Ecchymosis: No Induration: No Erythema: No Rash: No Hemosiderin Staining: No Scarring: No Mottled: No Pallor: No Moisture Rubor: No No Abnormalities Noted: No Dry / Scaly: No Maceration: No Wound Preparation Ulcer Cleansing: Rinsed/Irrigated with Saline Topical Anesthetic Applied: None Treatment Notes Wound #5 (Left Lower Leg) 1. Cleansed with: Clean wound with Normal Saline 2. Anesthetic Topical Lidocaine 4% cream to wound bed prior to debridement 4. Dressing Applied: Prisma Ag 5.  Secondary Dressing Applied Bordered Foam Dressing Dry Gauze Notes xeroform gauze to left lower leg and wrap with coban and tape with stretch net. Electronic Signature(s) Signed: 12/16/2017 8:00:08 AM By: Renne Crigler Entered By: Renne Crigler on 12/15/2017 10:36:10 [name] ([id number]) -------------------------------------------------------------------------------- Vitals Details Patient Name: [name] Date of Service: 12/15/2017 10:15 AM Medical Record Number: [id number] Patient Account Number: [id number] Date of Birth/Sex: 1922-11-14 (84 y.o. Female) Treating RN: Renne Crigler Primary Care Montee Tallman: Vonita Moss Other Clinician: Referring Lakelyn Straus: Vonita Moss Treating Kris No/Extender: STONE III, HOYT Weeks in Treatment: 48 Vital Signs Time Taken: 10:20 Temperature (F): 97.6 Height (in): 60 Pulse (bpm): 55 Weight (lbs): 100 Respiratory Rate (breaths/min): 14 Body Mass Index (BMI): 19.5 Blood Pressure (mmHg): 145/72 Reference Range: 80 - 120 mg / dl Electronic Signature(s) Signed: 12/16/2017 8:00:08 AM By: Renne Crigler Entered By: Renne Crigler on 12/15/2017 10:20:43

## 2017-12-20 NOTE — Progress Notes (Signed)
[name], [name] ([id number]) Visit Report for 12/15/2017 Chief Complaint Document Details Patient Name: [name], [name] Date of Service: 12/15/2017 10:15 AM Medical Record Number: [id number] Patient Account Number: [id number] Date of Birth/Sex: 1923-02-14 (84 y.o. F) Treating RN: Huel Coventry Primary Care Provider: Vonita Moss Other Clinician: Referring Provider: Vonita Moss Treating Provider/Extender: Linwood Dibbles, HOYT Weeks in Treatment: 48 Information Obtained from: Patient Chief Complaint patient is here for follow up evaluation of a sacral pressure ulcer Electronic Signature(s) Signed: 12/16/2017 4:20:45 AM By: Lenda Kelp PA-C Entered By: Lenda Kelp on 12/15/2017 09:48:33 Stancil, Myles Gip ([id number]) -------------------------------------------------------------------------------- HPI Details Patient Name: [name] Date of Service: 12/15/2017 10:15 AM Medical Record Number: [id number] Patient Account Number: [id number] Date of Birth/Sex: 1923/09/10 (84 y.o. F) Treating RN: Huel Coventry Primary Care Provider: Vonita Moss Other Clinician: Referring Provider: Vonita Moss Treating Provider/Extender: Linwood Dibbles, HOYT Weeks in Treatment: 48 History of Present Illness HPI Description: 01/07/17 this is a 84 year old woman admitted to the clinic today for review of a pressure ulcer on her lower sacrum. She is referred from her primary physician's office after being seen on 3/22 with a 3 cm pressure area. Her daughter and caretaker accompanied her today state that the area first became obvious about a month ago and his since deteriorated. They have recently got [name] a home health involved and have been using [name] to the wound. They have ordered a pressure relief surface for her mattress. They are turning her religiously. They state that she eats well and they've been forcing fluids on her. She is on a multivitamin. Looking through [facility] point last albumin I  see was 4.4 on 10/28. The patient has advanced parkinsonism which looks superficially like advanced Parkinson's disease although her daughter tells me she did not ever respond to Sinemet therefore this may have another pathology with signs of parkinsonism. However I think this is largely a mute point currently. She also has dementia and is nonambulatory. Since this started they have been keeping her in bed and turning her religiously every 2 hours. She lives at home in [place] with her husband with 24/7 care giving 01/13/17 santyl change qd. Still will require further debridement. continue santyl. 01/20/17; patient's wound actually looks some better less adherent necrotic surface. There is actually visible granulation. We're using [name] 01/27/17; better-looking surface but still a lot of necrotic tissue on the base of this wound. The periwound erythema is better than last week we are still using [name]. Her daughter tells [place] that she is still having trouble with the pressure-relief mattress through medical modalities 02/03/18; I had the patient scheduled for a two week followup however her daughter brought her in early concerned for discoloration on 2 areas of the wound circumference. We have bee using santyl 02/10/17;Better looking surface to the wound. Rim appears better suggesting better offloading. Using santyl 02/24/17; change to Silver Collegen last time. Wound appears better. 03/10/17; still using silver collagen religious offloading. Intake is satisfactory per her daughter. Dimension slightly better 03/12/2017 -- Dr. Jannetta Quint patient who had been seen 2 days ago and was doing fairly well. The patient is brought in by her daughter who noticed a new wound just above the previous wound on her sacral area and going on more to the left lateral side. She was very concerned and we asked her to get in for an opinion. 03/17/17; above is noted. The patient has developed a progressive area to the left of  her original wound. This  seems to this started with a ring of red skin with a more pale interior almost looking fungal. There was a rim of blister through part of the area although this did not look like zoster. They have been applying triamcinolone that was prescribed last week by [name] and the area has a fold to a linear band area which is confluent, erythematous and with obvious epidermal swelling but there is no overt tenderness or crepitus. She has lost some surface epithelium closer to the wound surface itself and now has a more superficial wound in this area and the extending erythema goes towards the left buttock. This is well demarcated between involved in normal skin but once again does not appear to be at all tender. If there is a contact issue here I cannot get the history out of the daughter or the caregiver that are with her. 03/24/17; the patient arrives today with the wound slightly worse slightly more drainage. The bandlike area of erythema that I treated as a possible pineal infection has improved somewhat although proximally is still has confluent erythema without overt tenderness. We have been using silver alginate since the most recent deterioration. To the bandlike degree of erythema we have been using Lotrisone cream 03/31/17; patient arrives today with the wound slightly larger, necrotic surface and surrounding erythema. The bandlike area of erythema that I treated as a possible pineal infection is less swollen but still present I've been using Lotrisone cream on that largely related to the presence of a tinea looking infection when this was first seen. We've been using silver alginate. X-ray that I ordered last week showed no acute bony abnormalities mild fecal impaction. Lab work showed a comprehensive metabolic panel that was normal including an albumin of 3.8. White count was 9.5 hemoglobin 12.3 differential count normal. C-reactive protein was less than 1 and  sedimentation rate and 17. The latter 2 values does not support an ongoing bacterial infection. 04/14/17; patient arrives after a 2 week hiatus. Her wound is not in good condition. Although the base of the wound looks Tina MainlandSHAW, Laterica W. ([id number]) stable she still has an erythematous area that was apparently blistered over the weekend. This again points to the left. As our intake nurse pointed out today this is in the area where the tissue folds together and we may need to prevent try to prevent this. Lab work and x-ray that I did to 3 weeks ago were unremarkable including her albumin nevertheless she is an extremely frail condition physically. We have have been using [name] 04/22/17; I changed her to silver alginate because of the surrounding maceration and moisture last week. The daughter did not like the way the wound looked in the middle of the week and changed her back to [place]. They're putting gauze on top of this. Thinks still using Lotrisone. 05/06/17 on evaluation today patient sacral wound appears to be doing okay and does not seem to be any worse. She is having no significant pain during evaluation today the secondary to mental status she is unable to rate or describe whether she had any pain she was not however flinching. Her daughter states that the wound does appear to be looking better to her. Still we are having difficulty with the skinfold that seems to be closing in on itself at this point. All in all I feel like she is making some good progress in the [name] seems to be the official for her. They do tell me that a refill if  we are gonna continue that today. No fevers, chills, nausea, or vomiting noted at this time. 05/13/17 presents today for evaluation concerning her ongoing sacral pressure ulcer. Unfortunately she also has an area of deep tissue injury in the right Ischial region which is starting to show up. The sacral wound also continues to show signs of necrotic tissue  overlying and has declined. Overall we really have not seen a significant improvement in the past several months in regard to the sacral wound and now patient is starting to develop a new wound in the right Ischial region. Obviously this is not trending in the direction that we want to see. No fevers, chills, nausea, or vomiting noted at this time. 05/19/17; I have not seen this wound and almost a month however there is nothing really positive to say about it. Necrotic tissue over the surface which superiorly I think abuts on her sacrum. She has surrounding erythema. I would be surprised if there is not underlying osteomyelitis or soft tissue infection. 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 She apparently eats well per description although I wonder about this looking at her. Lab work I did probably 4 weeks ago however was really quite normal including a serum albumin 05/26/17; culture I did last week grew Escherichia coli and methicillin sensitive staph aureus which should've been well covered by the Augmentin and ciprofloxacin. Indeed the erythema around the wound in the bed of the wound looks somewhat better. Her intake is still satisfactory. They now have a near fluidized bed 06/02/17; they completed the antibiotics last Friday. Using collagen. Daughter still reports eating and drinking well. There is less visualized erythema around the wound. 06/16/17; large pressure ulcer over her lower sacrum and coccyx. Using [name] to the wound bed. 06/30/17; certainly no change in dimensions of this large stage III wound over her sacrum and coccyx. They've been using [name]. There is no exposed bone. She has a candidal/tinea area in the right inguinal area. Other than that her daughter relates that she is eating and drinking well there changing her positioning to make sure the areas offloaded 07/14/17; no major change in the dimensions of this large stage III wound. Initially a smaller wound that  became secondarily infected causing significant tissue breakdown although it is been stable in the last several weeks. Tunneling superiorly at roughly 1:00 no change here either. There is no bone palpable. Both the patient's daughter and caretaker states that she eats well. I have not rechecked her blood work 07/27/17; patient arrives in clinic today and generally a deteriorated looking state. Mild fever with axillary temperature of 100.4. Daughter reports she has not been eating and drinking well since yesterday. She looks more pale and thin and less responsive. We have been using silver collagen to her wound 08/11/17; since the last time the patient was here things have gone better. Her fever went down and she started eating and drinking again. Culture I did of the wound showed methicillin sensitive staph aureus, Morganella and enterococcus. I only treated her with Keflex which would've not covered the Morganella and enterococcus however the purulent area on the 2:00 side of her wound is a lot better and the bandlike erythema that concern me also was resolved. I'd called the daughter last week to confirm that she was a lot better. She finished the Keflex last Thursday she also suffered a skin tear this morning perhaps while putting on her incontinence brief period is on the lateral aspect of  her right leg. Clean wound with the surface epithelium not viable. 08/25/17; last week the patient was noted to have erythema around the wound margin and a slight fever which the patient's daughter says was 50. Our office was contacted by home health however we did not have a space to work the patient in that she went to see her primary physician Dr. Maurice March. She was not febrile during this visit on 08/21/17 there was erythema around the wound similar to last occasion. Dr. Maurice March in reference to my culture from 07/28/17 and put her on doxycycline capsules which they're opening twice a day for 10 days. 09/17/17;  patient arrives today with the wound bed looking fairly well granulated. There is undermining from 4 to 6:00 although this seems to of contracted slightly. She does not have obvious infection although the daughter states there was some darkening of the wound circumference that is not evident today. They state she is eating well. They are concerned about oral thrush 09/30/16; very fibrin looking granulated wound bed. Her undermining from 4 to 6:00 is about the same but also appears to be well granulated. She has rolled edges of senescent tissue from roughly 7 to 12:00. There is no evidence of infection [name], [name] ([id number]) 10/13/17 on evaluation today patient appears to be doing fairly well all things considered in regard to her sacral wound. There's really not a lot of significant change or improvement she does have some evidence of contusion and deep tissue injury around the left border of the wound that patient's daughter did inquire about today. Nonetheless overall the wound appears to be doing about the same in my opinion. There is no significant indication of infection there also is no significant slough noted at this point. 10/27/17 She is here in follow up evaluation of a sacral ulcer. She is accompanied by her daughter and caregiver. There is red granulation tissue throughout, persistent discoloration to left border; this appears consistent with deep tissue injury. There are multiple areas covered in foam borders, tegaderm, etc that are "preventative" with "no wounds". We will continue with prisma and continue with two week follow ups 11/17/17 on evaluation today patient appears to be doing decently well in regard to her wound at this point. She continues to have a sacral wound ulcer. We see her roughly every two weeks. In the last week she was actually in the hospital due to what was diagnosed as sepsis although no organisms were ever identified in the actual blood cultures. The  hospital really was not sure that the wound was the cause of the infection but they really did not figure out anything else that would be a causative organism she did have a CT scan and that revealed no evidence of pneumonia there was also no evidence of urinary tract infection. Again it very well could have been the wound called in those although wound appears to be doing fairly well at this point. 12/15/17 on evaluation today patient actually appears to be doing about the same in regard to her sacral ulcer. The one thing different is that she does seem to have a rash where her right arm is contracted and being held to the thorax. The area underlying both on the ventral side of the arm as well is the thorax where it comes in contact shows evidence of a rash which appears to be fungal in nature. There does not appear to be any evidence of infection lies at this point. There does not  appear to be a rash consistent with shingles which was also of concern initially. No fevers, chills, nausea, or vomiting noted at this time. Electronic Signature(s) Signed: 12/16/2017 4:20:45 AM By: Lenda Kelp PA-C Entered By: Lenda Kelp on 12/16/2017 04:12:19 [name] ([id number]) -------------------------------------------------------------------------------- Physical Exam Details Patient Name: [name] Date of Service: 12/15/2017 10:15 AM Medical Record Number: [id number] Patient Account Number: [id number] Date of Birth/Sex: 1923-01-17 (84 y.o. F) Treating RN: Huel Coventry Primary Care Provider: Vonita Moss Other Clinician: Referring Provider: Vonita Moss Treating Provider/Extender: Linwood Dibbles, HOYT Weeks in Treatment: 48 Constitutional Chronically ill appearing but in no apparent acute distress. Respiratory normal breathing without difficulty. clear to auscultation bilaterally. Cardiovascular regular rate and rhythm with normal S1, S2. Psychiatric this patient is able to make  decisions and demonstrates good insight into disease process. Alert and Oriented x 3. pleasant and cooperative. Notes Patient's wound bed appears to show a good granular bed at this point in time there's no evidence of infection currently is good news. She still does have Epiboly noted which I do think coupled with the loose skin is causing a very difficult healing situation. Nonetheless the wound appears to be clean and noninfected. No sharp debridement was necessary the wound was cleaned with saline and gauze mechanically. Electronic Signature(s) Signed: 12/16/2017 4:20:45 AM By: Lenda Kelp PA-C Entered By: Lenda Kelp on 12/16/2017 04:13:10 [name] ([id number]) -------------------------------------------------------------------------------- Physician Orders Details Patient Name: [name] Date of Service: 12/15/2017 10:15 AM Medical Record Number: [id number] Patient Account Number: [id number] Date of Birth/Sex: 02-Jul-1923 (84 y.o. F) Treating RN: Renne Crigler Primary Care Provider: Vonita Moss Other Clinician: Referring Provider: Vonita Moss Treating Provider/Extender: Linwood Dibbles, HOYT Weeks in Treatment: 46 Verbal / Phone Orders: No Diagnosis Coding ICD-10 Coding Code Description L89.153 Pressure ulcer of sacral region, stage 3 G20 Parkinson's disease L03.312 Cellulitis of back [any part except buttock] S81.811D Laceration without foreign body, right lower leg, subsequent encounter Wound Cleansing Wound #1 Midline Coccyx o Clean wound with Normal Saline. o May Shower, gently pat wound dry prior to applying new dressing. Wound #5 Left Lower Leg o Clean wound with Normal Saline. o May Shower, gently pat wound dry prior to applying new dressing. Anesthetic (add to Medication List) Wound #1 Midline Coccyx o Topical Lidocaine 4% cream applied to wound bed prior to debridement (In Clinic Only). Skin Barriers/Peri-Wound Care Wound #1  Midline Coccyx o Skin Prep - use only where the allevyn sticks not on reddened areas Primary Wound Dressing Wound #1 Midline Coccyx o Prisma Ag - pack into undermining, may require 2 pieces of prisma ag for wound packing Wound #5 Left Lower Leg o Xeroform Secondary Dressing Wound #1 Midline Coccyx o Dry Gauze o Boardered Foam Dressing - HHRN PLEASE ORDER ALLEVYN LIFE BORDERED FOAM FOR PATIENT (OFF BRANDS IRRITATE HER SKIN) Wound #5 Left Lower Leg o Other - coban, tape, and stretch net Dressing Change Frequency Wound #1 Midline Coccyx DARRIS, CARACHURE. ([id number]) o Change dressing every day. Follow-up Appointments o Return Appointment in 2 weeks. Off-Loading Wound #1 Midline Coccyx o Roho cushion for wheelchair o Mattress - fluidized air mattress o Turn and reposition every 2 hours Additional Orders / Instructions Wound #1 Midline Coccyx o Increase protein intake. - please add protein supplements to patients diet o Other: - please add vitamin A, vitamin C and zinc supplements to patients diet Home Health Wound #1 Midline Coccyx o Continue Home Health Visits - [facility]   o Home Health Nurse may visit PRN to address patientos wound care needs. o FACE TO FACE ENCOUNTER: MEDICARE and MEDICAID PATIENTS: I certify that this patient is under my care and that I had a face-to-face encounter that meets the physician face-to-face encounter requirements with this patient on this date. The encounter with the patient was in whole or in part for the following MEDICAL CONDITION: (primary reason for Home Healthcare) MEDICAL NECESSITY: I certify, that based on my findings, NURSING services are a medically necessary home health service. HOME BOUND STATUS: I certify that my clinical findings support that this patient is homebound (i.e., Due to illness or injury, pt requires aid of supportive devices such as crutches, cane, wheelchairs, walkers, the use of special  transportation or the assistance of another person to leave their place of residence. There is a normal inability to leave the home and doing so requires considerable and taxing effort. Other absences are for medical reasons / religious services and are infrequent or of short duration when for other reasons). o If current dressing causes regression in wound condition, may D/C ordered dressing product/s and apply Normal Saline Moist Dressing daily until next Wound Healing Center / Other MD appointment. Notify Wound Healing Center of regression in wound condition at [phone]. o Please direct any NON-WOUND related issues/requests for orders to patient's Primary Care Physician - Dr Vonita Moss Patient Medications Allergies: Phenergan, Sulfa (Sulfonamide Antibiotics) Notifications Medication Indication Start End nystatin 12/15/2017 DOSE topical 100,000 unit/gram powder - powder topical applied to the rash on the trunk of the body where patientos arm is contracted daily Notes Nystatin powder on right abdomen rash area Electronic Signature(s) Signed: 12/15/2017 11:44:57 AM By: Curtis Sites Signed: 12/16/2017 4:20:45 AM By: Lenda Kelp PA-C Previous Signature: 12/15/2017 11:35:41 AM Version By: Lenda Kelp PA-C Entered By: Curtis Sites on 12/15/2017 11:38:01 [name] ([id number]) [name], [name] ([id number]) -------------------------------------------------------------------------------- Prescription 12/15/2017 Patient Name: [name] Rx Reference #: Date of Birth: 1923/09/18 Provider: Lenda Kelp PA-C Sex: F NPI#: [id number] Phone #: [phone] DEA#: [id number] License #: Patient Address: [address] [facility] [place], [place] [address] [facility] [address] [place], [place] [address] [phone] Allergies Phenergan Sulfa (Sulfonamide  Antibiotics) Medication Medication: Route: Strength: Form: nystatin 100,000 unit/gram topical topical 100,000 unit/gram powder powder Class: TOPICAL ANTIFUNGALS Dose: Frequency / Time: Indication: powder topical applied to the rash on the trunk of the body where patientos arm is contracted daily Number of Refills: Number of Units: 4 One (1) Bottle(s) Generic Substitution: Start Date: End Date: One Time Use: Substitution Permitted 12/15/2017 No Note to Pharmacy: Signature(s): Date(s): Electronic Signature(s) Signed: 12/15/2017 11:44:57 AM By: Aileen Fass ([id number]) Signed: 12/16/2017 4:20:45 AM By: Lenda Kelp PA-C Entered By: Curtis Sites on 12/15/2017 11:38:02 [name] ([id number]) --------------------------------------------------------------------------------  Problem List Details Patient Name: [name] Date of Service: 12/15/2017 10:15 AM Medical Record Number: [id number] Patient Account Number: [id number] Date of Birth/Sex: [birth year] (84 y.o. F) Treating RN: Huel Coventry Primary Care Provider: Vonita Moss Other Clinician: Referring Provider: Vonita Moss Treating Provider/Extender: Linwood Dibbles, HOYT Weeks in Treatment: 48 Active Problems ICD-10 Impacting Encounter Code Description Active Date Wound Healing Diagnosis L89.153 Pressure ulcer of sacral region, stage 3 01/07/2017 Yes G20 Parkinson's disease 01/07/2017 Yes L03.312 Cellulitis of back [any part except buttock] 07/28/2017 Yes S81.811D Laceration without foreign body, right lower leg, subsequent 08/11/2017 Yes encounter Inactive Problems Resolved  Problems Electronic Signature(s) Signed: 12/16/2017 4:20:45 AM By: Lenda Kelp PA-C Entered By: Lenda Kelp on 12/15/2017 09:48:25 [name] ([id number]) -------------------------------------------------------------------------------- Progress Note Details Patient Name: [name] Date of  Service: 12/15/2017 10:15 AM Medical Record Number: [id number] Patient Account Number: [id number] Date of Birth/Sex: 22-Mar-1923 (84 y.o. F) Treating RN: Huel Coventry Primary Care Provider: Vonita Moss Other Clinician: Referring Provider: Vonita Moss Treating Provider/Extender: Linwood Dibbles, HOYT Weeks in Treatment: 48 Subjective Chief Complaint Information obtained from Patient patient is here for follow up evaluation of a sacral pressure ulcer History of Present Illness (HPI) 01/07/17 this is a 84 year old woman admitted to the clinic today for review of a pressure ulcer on her lower sacrum. She is referred from her primary physician's office after being seen on 3/22 with a 3 cm pressure area. Her daughter and caretaker accompanied her today state that the area first became obvious about a month ago and his since deteriorated. They have recently got [name] a home health involved and have been using [name] to the wound. They have ordered a pressure relief surface for her mattress. They are turning her religiously. They state that she eats well and they've been forcing fluids on her. She is on a multivitamin. Looking through [facility] point last albumin I see was 4.4 on 10/28. The patient has advanced parkinsonism which looks superficially like advanced Parkinson's disease although her daughter tells me she did not ever respond to Sinemet therefore this may have another pathology with signs of parkinsonism. However I think this is largely a mute point currently. She also has dementia and is nonambulatory. Since this started they have been keeping her in bed and turning her religiously every 2 hours. She lives at home in [place] with her husband with 24/7 care giving 01/13/17 santyl change qd. Still will require further debridement. continue santyl. 01/20/17; patient's wound actually looks some better less adherent necrotic surface. There is actually visible granulation. We're using  [name] 01/27/17; better-looking surface but still a lot of necrotic tissue on the base of this wound. The periwound erythema is better than last week we are still using [name]. Her daughter tells [place] that she is still having trouble with the pressure-relief mattress through medical modalities 02/03/18; I had the patient scheduled for a two week followup however her daughter brought her in early concerned for discoloration on 2 areas of the wound circumference. We have bee using santyl 02/10/17;Better looking surface to the wound. Rim appears better suggesting better offloading. Using santyl 02/24/17; change to Silver Collegen last time. Wound appears better. 03/10/17; still using silver collagen religious offloading. Intake is satisfactory per her daughter. Dimension slightly better 03/12/2017 -- Dr. Jannetta Quint patient who had been seen 2 days ago and was doing fairly well. The patient is brought in by her daughter who noticed a new wound just above the previous wound on her sacral area and going on more to the left lateral side. She was very concerned and we asked her to get in for an opinion. 03/17/17; above is noted. The patient has developed a progressive area to the left of her original wound. This seems to this started with a ring of red skin with a more pale interior almost looking fungal. There was a rim of blister through part of the area although this did not look like zoster. They have been applying triamcinolone that was prescribed last week by Dr. Meyer Russel and the area has a fold to a linear band area  which is confluent, erythematous and with obvious epidermal swelling but there is no overt tenderness or crepitus. She has lost some surface epithelium closer to the wound surface itself and now has a more superficial wound in this area and the extending erythema goes towards the left buttock. This is well demarcated between involved in normal skin but once again does not appear to be at all tender.  If there is a contact issue here I cannot get the history out of the daughter or the caregiver that are with her. 03/24/17; the patient arrives today with the wound slightly worse slightly more drainage. The bandlike area of erythema that I treated as a possible pineal infection has improved somewhat although proximally is still has confluent erythema without overt tenderness. We have been using silver alginate since the most recent deterioration. To the bandlike degree of erythema we have been using Lotrisone cream 03/31/17; patient arrives today with the wound slightly larger, necrotic surface and surrounding erythema. The bandlike area of erythema that I treated as a possible pineal infection is less swollen but still present I've been using Lotrisone cream on that largely related to the presence of a tinea looking infection when this was first seen. We've been using silver alginate. RAEDYN, KLINCK ([id number]) X-ray that I ordered last week showed no acute bony abnormalities mild fecal impaction. Lab work showed a comprehensive metabolic panel that was normal including an albumin of 3.8. White count was 9.5 hemoglobin 12.3 differential count normal. C-reactive protein was less than 1 and sedimentation rate and 17. The latter 2 values does not support an ongoing bacterial infection. 04/14/17; patient arrives after a 2 week hiatus. Her wound is not in good condition. Although the base of the wound looks stable she still has an erythematous area that was apparently blistered over the weekend. This again points to the left. As our intake nurse pointed out today this is in the area where the tissue folds together and we may need to prevent try to prevent this. Lab work and x-ray that I did to 3 weeks ago were unremarkable including her albumin nevertheless she is an extremely frail condition physically. We have have been using [name] 04/22/17; I changed her to silver alginate because of the  surrounding maceration and moisture last week. The daughter did not like the way the wound looked in the middle of the week and changed her back to [place]. They're putting gauze on top of this. Thinks still using Lotrisone. 05/06/17 on evaluation today patient sacral wound appears to be doing okay and does not seem to be any worse. She is having no significant pain during evaluation today the secondary to mental status she is unable to rate or describe whether she had any pain she was not however flinching. Her daughter states that the wound does appear to be looking better to her. Still we are having difficulty with the skinfold that seems to be closing in on itself at this point. All in all I feel like she is making some good progress in the [name] seems to be the official for her. They do tell me that a refill if we are gonna continue that today. No fevers, chills, nausea, or vomiting noted at this time. 05/13/17 presents today for evaluation concerning her ongoing sacral pressure ulcer. Unfortunately she also has an area of deep tissue injury in the right Ischial region which is starting to show up. The sacral wound also continues to show signs of necrotic tissue  overlying and has declined. Overall we really have not seen a significant improvement in the past several months in regard to the sacral wound and now patient is starting to develop a new wound in the right Ischial region. Obviously this is not trending in the direction that we want to see. No fevers, chills, nausea, or vomiting noted at this time. 05/19/17; I have not seen this wound and almost a month however there is nothing really positive to say about it. Necrotic tissue over the surface which superiorly I think abuts on her sacrum. She has surrounding erythema. I would be surprised if there is not underlying osteomyelitis or soft tissue infection. 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 She apparently eats well per  description although I wonder about this looking at her. Lab work I did probably 4 weeks ago however was really quite normal including a serum albumin 05/26/17; culture I did last week grew Escherichia coli and methicillin sensitive staph aureus which should've been well covered by the Augmentin and ciprofloxacin. Indeed the erythema around the wound in the bed of the wound looks somewhat better. Her intake is still satisfactory. They now have a near fluidized bed 06/02/17; they completed the antibiotics last Friday. Using collagen. Daughter still reports eating and drinking well. There is less visualized erythema around the wound. 06/16/17; large pressure ulcer over her lower sacrum and coccyx. Using [name] to the wound bed. 06/30/17; certainly no change in dimensions of this large stage III wound over her sacrum and coccyx. They've been using [name]. There is no exposed bone. She has a candidal/tinea area in the right inguinal area. Other than that her daughter relates that she is eating and drinking well there changing her positioning to make sure the areas offloaded 07/14/17; no major change in the dimensions of this large stage III wound. Initially a smaller wound that became secondarily infected causing significant tissue breakdown although it is been stable in the last several weeks. Tunneling superiorly at roughly 1:00 no change here either. There is no bone palpable. Both the patient's daughter and caretaker states that she eats well. I have not rechecked her blood work 07/27/17; patient arrives in clinic today and generally a deteriorated looking state. Mild fever with axillary temperature of 100.4. Daughter reports she has not been eating and drinking well since yesterday. She looks more pale and thin and less responsive. We have been using silver collagen to her wound 08/11/17; since the last time the patient was here things have gone better. Her fever went down and she started eating  and drinking again. Culture I did of the wound showed methicillin sensitive staph aureus, Morganella and enterococcus. I only treated her with Keflex which would've not covered the Morganella and enterococcus however the purulent area on the 2:00 side of her wound is a lot better and the bandlike erythema that concern me also was resolved. I'd called the daughter last week to confirm that she was a lot better. She finished the Keflex last Thursday she also suffered a skin tear this morning perhaps while putting on her incontinence brief period is on the lateral aspect of her right leg. Clean wound with the surface epithelium not viable. 08/25/17; last week the patient was noted to have erythema around the wound margin and a slight fever which the patient's daughter says was 69. Our office was contacted by home health however we did not have a space to work the patient in that she went to  see her primary physician Dr. Maurice March. She was not febrile during this visit on 08/21/17 there was erythema around the wound similar to last occasion. Dr. Maurice March in reference to my culture from 07/28/17 and put her on doxycycline capsules which they're opening twice a day for 10 days. [name], [name] ([id number]) 09/17/17; patient arrives today with the wound bed looking fairly well granulated. There is undermining from 4 to 6:00 although this seems to of contracted slightly. She does not have obvious infection although the daughter states there was some darkening of the wound circumference that is not evident today. They state she is eating well. They are concerned about oral thrush 09/30/16; very fibrin looking granulated wound bed. Her undermining from 4 to 6:00 is about the same but also appears to be well granulated. She has rolled edges of senescent tissue from roughly 7 to 12:00. There is no evidence of infection 10/13/17 on evaluation today patient appears to be doing fairly well all things considered in regard  to her sacral wound. There's really not a lot of significant change or improvement she does have some evidence of contusion and deep tissue injury around the left border of the wound that patient's daughter did inquire about today. Nonetheless overall the wound appears to be doing about the same in my opinion. There is no significant indication of infection there also is no significant slough noted at this point. 10/27/17 She is here in follow up evaluation of a sacral ulcer. She is accompanied by her daughter and caregiver. There is red granulation tissue throughout, persistent discoloration to left border; this appears consistent with deep tissue injury. There are multiple areas covered in foam borders, tegaderm, etc that are "preventative" with "no wounds". We will continue with prisma and continue with two week follow ups 11/17/17 on evaluation today patient appears to be doing decently well in regard to her wound at this point. She continues to have a sacral wound ulcer. We see her roughly every two weeks. In the last week she was actually in the hospital due to what was diagnosed as sepsis although no organisms were ever identified in the actual blood cultures. The hospital really was not sure that the wound was the cause of the infection but they really did not figure out anything else that would be a causative organism she did have a CT scan and that revealed no evidence of pneumonia there was also no evidence of urinary tract infection. Again it very well could have been the wound called in those although wound appears to be doing fairly well at this point. 12/15/17 on evaluation today patient actually appears to be doing about the same in regard to her sacral ulcer. The one thing different is that she does seem to have a rash where her right arm is contracted and being held to the thorax. The area underlying both on the ventral side of the arm as well is the thorax where it comes in contact  shows evidence of a rash which appears to be fungal in nature. There does not appear to be any evidence of infection lies at this point. There does not appear to be a rash consistent with shingles which was also of concern initially. No fevers, chills, nausea, or vomiting noted at this time. Patient History Unable to Obtain Patient History due to Altered Mental Status. Information obtained from Patient. Social History Never smoker, Marital Status - Married, Alcohol Use - Never, Drug Use - No History, Caffeine Use -  Never. Medical And Surgical History Notes Ear/Nose/Mouth/Throat dysphagia Neurologic parkinsons, tremor Review of Systems (ROS) Constitutional Symptoms (General Health) Denies complaints or symptoms of Fever, Chills. Respiratory The patient has no complaints or symptoms. Cardiovascular The patient has no complaints or symptoms. Psychiatric The patient has no complaints or symptoms. [name], [name] ([id number]) Objective Constitutional Chronically ill appearing but in no apparent acute distress. Vitals Time Taken: 10:20 AM, Height: 60 in, Weight: 100 lbs, BMI: 19.5, Temperature: 97.6 F, Pulse: 55 bpm, Respiratory Rate: 14 breaths/min, Blood Pressure: 145/72 mmHg. Respiratory normal breathing without difficulty. clear to auscultation bilaterally. Cardiovascular regular rate and rhythm with normal S1, S2. Psychiatric this patient is able to make decisions and demonstrates good insight into disease process. Alert and Oriented x 3. pleasant and cooperative. General Notes: Patient's wound bed appears to show a good granular bed at this point in time there's no evidence of infection currently is good news. She still does have Epiboly noted which I do think coupled with the loose skin is causing a very difficult healing situation. Nonetheless the wound appears to be clean and noninfected. No sharp debridement was necessary the wound was cleaned with saline and gauze  mechanically. Integumentary (Hair, Skin) Wound #1 status is Open. Original cause of wound was Pressure Injury. The wound is located on the Midline Coccyx. The wound measures 4.5cm length x 3.5cm width x 0.4cm depth; 12.37cm^2 area and 4.948cm^3 volume. There is muscle and Fat Layer (Subcutaneous Tissue) Exposed exposed. There is no tunneling noted, however, there is undermining starting at 11:00 and ending at 1:00 with a maximum distance of 2.5cm. There is a large amount of serosanguineous drainage noted. Foul odor after cleansing was noted. The wound margin is flat and intact. There is medium (34-66%) red granulation within the wound bed. There is a medium (34-66%) amount of necrotic tissue within the wound bed including Adherent Slough. The periwound skin appearance exhibited: Induration, Erythema. The periwound skin appearance did not exhibit: Callus, Crepitus, Excoriation, Rash, Scarring, Dry/Scaly, Maceration, Atrophie Blanche, Cyanosis, Ecchymosis, Hemosiderin Staining, Mottled, Pallor, Rubor. The surrounding wound skin color is noted with erythema which is circumferential. Periwound temperature was noted as No Abnormality. The periwound has tenderness on palpation. Wound #5 status is Open. Original cause of wound was Pressure Injury. The wound is located on the Left Lower Leg. The wound measures 1.5cm length x 1.5cm width x 0.1cm depth; 1.767cm^2 area and 0.177cm^3 volume. There is no tunneling or undermining noted. There is a medium amount of serous drainage noted. The wound margin is distinct with the outline attached to the wound base. There is small (1-33%) pink granulation within the wound bed. There is a large (67-100%) amount of necrotic tissue within the wound bed including Eschar. The periwound skin appearance did not exhibit: Callus, Crepitus, Excoriation, Induration, Rash, Scarring, Dry/Scaly, Maceration, Atrophie Blanche, Cyanosis, Ecchymosis, Hemosiderin Staining, Mottled,  Pallor, Rubor, Erythema. Assessment Active Problems [name], [name] ([id number]) ICD-10 L89.153 - Pressure ulcer of sacral region, stage 3 G20 - Parkinson's disease L03.312 - Cellulitis of back [any part except buttock] S81.811D - Laceration without foreign body, right lower leg, subsequent encounter Plan Wound Cleansing: Wound #1 Midline Coccyx: Clean wound with Normal Saline. May Shower, gently pat wound dry prior to applying new dressing. Wound #5 Left Lower Leg: Clean wound with Normal Saline. May Shower, gently pat wound dry prior to applying new dressing. Anesthetic (add to Medication List): Wound #1 Midline Coccyx: Topical Lidocaine 4% cream applied to wound bed prior to  debridement (In Clinic Only). Skin Barriers/Peri-Wound Care: Wound #1 Midline Coccyx: Skin Prep - use only where the allevyn sticks not on reddened areas Primary Wound Dressing: Wound #1 Midline Coccyx: Prisma Ag - pack into undermining, may require 2 pieces of prisma ag for wound packing Wound #5 Left Lower Leg: Xeroform Secondary Dressing: Wound #1 Midline Coccyx: Dry Gauze Boardered Foam Dressing - HHRN PLEASE ORDER ALLEVYN LIFE BORDERED FOAM FOR PATIENT (OFF BRANDS IRRITATE HER SKIN) Wound #5 Left Lower Leg: Other - coban, tape, and stretch net Dressing Change Frequency: Wound #1 Midline Coccyx: Change dressing every day. Follow-up Appointments: Return Appointment in 2 weeks. Off-Loading: Wound #1 Midline Coccyx: Roho cushion for wheelchair Mattress - fluidized air mattress Turn and reposition every 2 hours Additional Orders / Instructions: Wound #1 Midline Coccyx: Increase protein intake. - please add protein supplements to patients diet Other: - please add vitamin A, vitamin C and zinc supplements to patients diet Home Health: Wound #1 Midline Coccyx: Continue Home Health Visits - [facility] Nurse may visit PRN to address patient s wound care needs. FACE TO FACE ENCOUNTER:  MEDICARE and MEDICAID PATIENTS: I certify that this patient is under my care and that I had a face-to-face encounter that meets the physician face-to-face encounter requirements with this patient on this date. The encounter with the patient was in whole or in part for the following MEDICAL CONDITION: (primary reason for Home [name], [name] ([id number]) Healthcare) MEDICAL NECESSITY: I certify, that based on my findings, NURSING services are a medically necessary home health service. HOME BOUND STATUS: I certify that my clinical findings support that this patient is homebound (i.e., Due to illness or injury, pt requires aid of supportive devices such as crutches, cane, wheelchairs, walkers, the use of special transportation or the assistance of another person to leave their place of residence. There is a normal inability to leave the home and doing so requires considerable and taxing effort. Other absences are for medical reasons / religious services and are infrequent or of short duration when for other reasons). If current dressing causes regression in wound condition, may D/C ordered dressing product/s and apply Normal Saline Moist Dressing daily until next Wound Healing Center / Other MD appointment. Notify Wound Healing Center of regression in wound condition at [phone]. Please direct any NON-WOUND related issues/requests for orders to patient's Primary Care Physician - Dr Vonita Moss The following medication(s) was prescribed: nystatin topical 100,000 unit/gram powder powder topical applied to the rash on the trunk of the body where patient s arm is contracted daily starting 12/15/2017 General Notes: Nystatin powder on right abdomen rash area At this point I'm going to suggest that we continue with the current wound care orders since she seems to be doing well with this. Patient's daughter is in agreement with plan. See her for reevaluation in two weeks on normal schedule. Please see  above for specific wound care orders. We will see patient for re-evaluation in 2 week(s) here in the clinic. If anything worsens or changes patient will contact our office for additional recommendations. Electronic Signature(s) Signed: 12/16/2017 4:20:45 AM By: Lenda Kelp PA-C Entered By: Lenda Kelp on 12/16/2017 04:13:43 [name] ([id number]) -------------------------------------------------------------------------------- ROS/PFSH Details Patient Name: [name] Date of Service: 12/15/2017 10:15 AM Medical Record Number: [id number] Patient Account Number: [id number] Date of Birth/Sex: 07-23-1923 (84 y.o. F) Treating RN: Huel Coventry Primary Care Provider: Vonita Moss Other Clinician: Referring Provider: Vonita Moss Treating Provider/Extender: Linwood Dibbles,  HOYT Weeks in Treatment: 48 Unable to Obtain Patient History due to oo Altered Mental Status Information Obtained From Patient Wound History Do you currently have one or more open woundso Yes How many open wounds do you currently haveo 1 Approximately how long have you had your woundso 1 week How have you been treating your wound(s) until nowo santyl and gauze Has your wound(s) ever healed and then re-openedo No Have you had any lab work done in the past montho No Have you tested positive for an antibiotic resistant organism (MRSA, VRE)o No Have you tested positive for osteomyelitis (bone infection)o No Have you had any tests for circulation on your legso No Constitutional Symptoms (General Health) Complaints and Symptoms: Negative for: Fever; Chills Eyes Medical History: Negative for: Cataracts; Glaucoma; Optic Neuritis Ear/Nose/Mouth/Throat Medical History: Negative for: Chronic sinus problems/congestion; Middle ear problems Past Medical History Notes: dysphagia Hematologic/Lymphatic Medical History: Positive for: Anemia Negative for: Hemophilia; Human Immunodeficiency Virus; Lymphedema; Sickle  Cell Disease Respiratory Complaints and Symptoms: No Complaints or Symptoms Medical History: Negative for: Aspiration; Asthma; Chronic Obstructive Pulmonary Disease (COPD); Pneumothorax; Sleep Apnea; Tuberculosis Cardiovascular ANNISE, BORAN. ([id number]) Complaints and Symptoms: No Complaints or Symptoms Medical History: Positive for: Hypertension Negative for: Angina; Arrhythmia; Congestive Heart Failure; Coronary Artery Disease; Deep Vein Thrombosis; Hypotension; Myocardial Infarction; Peripheral Arterial Disease; Peripheral Venous Disease; Phlebitis; Vasculitis Gastrointestinal Medical History: Negative for: Cirrhosis ; Colitis; Crohnos; Hepatitis A; Hepatitis B; Hepatitis C Endocrine Medical History: Negative for: Type I Diabetes; Type II Diabetes Genitourinary Medical History: Negative for: End Stage Renal Disease Immunological Medical History: Negative for: Lupus Erythematosus; Raynaudos; Scleroderma Integumentary (Skin) Medical History: Negative for: History of Burn; History of pressure wounds Musculoskeletal Medical History: Negative for: Gout; Rheumatoid Arthritis; Osteoarthritis; Osteomyelitis Neurologic Medical History: Positive for: Dementia Negative for: Neuropathy; Quadriplegia; Paraplegia; Seizure Disorder Past Medical History Notes: parkinsons, tremor Oncologic Medical History: Negative for: Received Chemotherapy; Received Radiation Psychiatric Complaints and Symptoms: No Complaints or Symptoms Medical History: Negative for: Anorexia/bulimia; Confinement Anxiety [name], [name] ([id number]) Immunizations Pneumococcal Vaccine: Received Pneumococcal Vaccination: Yes Immunization Notes: up to date Implantable Devices Family and Social History Never smoker; Marital Status - Married; Alcohol Use: Never; Drug Use: No History; Caffeine Use: Never; Financial Concerns: No; Food, Clothing or Shelter Needs: No; Support System Lacking: No;  Transportation Concerns: No; Advanced Directives: Yes (Not Provided); Patient does not want information on Advanced Directives; Medical Power of Attorney: Yes - dtr (Not Provided) Physician Affirmation I have reviewed and agree with the above information. Electronic Signature(s) Signed: 12/16/2017 4:20:45 AM By: Lenda Kelp PA-C Signed: 12/18/2017 4:43:23 PM By: Elliot Gurney, BSN, RN, CWS, Kim RN, BSN Entered By: Lenda Kelp on 12/16/2017 04:12:49 [name] ([id number]) -------------------------------------------------------------------------------- SuperBill Details Patient Name: [name] Date of Service: 12/15/2017 Medical Record Number: [id number] Patient Account Number: [id number] Date of Birth/Sex: Oct 30, 1922 (84 y.o. F) Treating RN: Renne Crigler Primary Care Provider: Vonita Moss Other Clinician: Referring Provider: Vonita Moss Treating Provider/Extender: Linwood Dibbles, HOYT Weeks in Treatment: 48 Diagnosis Coding ICD-10 Codes Code Description L89.153 Pressure ulcer of sacral region, stage 3 G20 Parkinson's disease L03.312 Cellulitis of back [any part except buttock] S81.811D Laceration without foreign body, right lower leg, subsequent encounter Facility Procedures CPT4 Code: [id number] Description: 99213 - WOUND CARE VISIT-LEV 3 EST PT Modifier: Quantity: 1 Physician Procedures CPT4 Code: [id number] Description: 99213 - WC PHYS LEVEL 3 - EST PT ICD-10 Diagnosis Description L89.153 Pressure ulcer of sacral region, stage 3 G20 Parkinson's disease L03.312 Cellulitis  of back [any part except buttock] S81.811D Laceration without foreign body, right lower  leg, subseque Modifier: nt encounter Quantity: 1 Electronic Signature(s) Signed: 12/16/2017 4:20:45 AM By: Lenda Kelp PA-C Entered By: Lenda Kelp on 12/16/2017 04:14:00

## 2017-12-21 ENCOUNTER — Other Ambulatory Visit: Payer: Self-pay | Admitting: Family Medicine

## 2017-12-22 NOTE — Telephone Encounter (Signed)
LOV  08/21/17 Dr. Dossie Arbourrissman

## 2017-12-24 ENCOUNTER — Telehealth: Payer: Self-pay | Admitting: Family Medicine

## 2017-12-24 MED ORDER — FLUCONAZOLE 150 MG PO TABS: 150.0000 mg | ORAL_TABLET | Freq: Once | ORAL | 2 refills | Status: AC

## 2017-12-24 MED ORDER — NYSTATIN 100000 UNIT/ML MT SUSP: 5.0000 mL | Freq: Four times a day (QID) | OROMUCOSAL | 0 refills | Status: DC

## 2017-12-24 NOTE — Telephone Encounter (Signed)
Daughter, Damian Leavellrudy calling to check on status of Diflucan being called into the pharmacy. She is visiting from out of town and the patient can't get around on her own.

## 2017-12-24 NOTE — Telephone Encounter (Signed)
Copied from CRM [phone]. Topic: General - Other >> Dec 24, 2017  1:28 PM Darletta MollLander, Lumin L wrote: Reason for CRM: Darvin Neighboursawn Samuel w/ [facility] calling to ask that diflucan be called TODAY in for the patient's rash that's spreading on her right side. Also nystatin (MYCOSTATIN/NYSTOP) powder needs to be refilled.  [facility] [id number] - [place], [place] - [address] DEA #:  [id number]

## 2017-12-24 NOTE — Telephone Encounter (Signed)
Patients daughter is requesting diflucan to be called in to the pharmacy ASAP.  [facility] [place]  Thanks

## 2017-12-29 ENCOUNTER — Encounter: Payer: Medicare Other | Attending: Physician Assistant | Admitting: Physician Assistant

## 2017-12-29 DIAGNOSIS — L89153 Pressure ulcer of sacral region, stage 3: Secondary | ICD-10-CM | POA: Insufficient documentation

## 2017-12-29 DIAGNOSIS — F0390 Unspecified dementia without behavioral disturbance: Secondary | ICD-10-CM | POA: Insufficient documentation

## 2017-12-29 DIAGNOSIS — G20 Parkinson's disease: Secondary | ICD-10-CM | POA: Insufficient documentation

## 2017-12-29 DIAGNOSIS — L97829 Non-pressure chronic ulcer of other part of left lower leg with unspecified severity: Secondary | ICD-10-CM | POA: Diagnosis not present

## 2017-12-29 DIAGNOSIS — L97819 Non-pressure chronic ulcer of other part of right lower leg with unspecified severity: Secondary | ICD-10-CM | POA: Insufficient documentation

## 2017-12-29 DIAGNOSIS — I10 Essential (primary) hypertension: Secondary | ICD-10-CM | POA: Diagnosis not present

## 2018-01-01 NOTE — Progress Notes (Signed)
[name], [name] ([id number]) Visit Report for 12/29/2017 Arrival Information Details Patient Name: [name], [name] Date of Service: 12/29/2017 10:15 AM Medical Record Number: [id number] Patient Account Number: [id number] Date of Birth/Sex: [birth year] (84 y.o. F) Treating RN: [name] Primary Care Lyanne Kates: Vonita Moss Other Clinician: Referring Jess Sulak: Vonita Moss Treating [name]/Extender: [name], [name] in Treatment: 50 Visit Information History Since Last Visit Added or deleted any medications: No Patient Arrived: Wheel Chair Any new allergies or adverse reactions: No Arrival Time: 10:18 Had a fall or experienced change in No activities of daily living that may affect Accompanied By: cg and dtr risk of falls: Transfer Assistance: Manual Signs or symptoms of abuse/neglect since No Patient Identification Verified: Yes last visito Secondary Verification Process Completed: Yes Hospitalized since last visit: No Patient Requires Transmission-Based No Implantable device outside of the clinic No Precautions: excluding Patient Has Alerts: No cellular tissue based products placed in the center since last visit: Has Dressing in Place as Prescribed: Yes Pain Present Now: Unable to Respond Electronic Signature(s) Signed: 12/29/2017 3:48:45 PM By: [name] Entered By: [name] on 12/29/2017 10:25:05 [name] ([id number]) -------------------------------------------------------------------------------- Clinic Level of Care Assessment Details Patient Name: [name] Date of Service: 12/29/2017 10:15 AM Medical Record Number: [id number] Patient Account Number: [id number] Date of Birth/Sex: [birth year] (84 y.o. F) Treating RN: Phillis Haggis Primary Care Genelle Economou: Vonita Moss Other Clinician: Referring Suriyah Vergara: Vonita Moss Treating Millard Bautch/Extender: [name], [name] in Treatment: 50 Clinic Level of Care Assessment Items TOOL 4  Quantity Score X - Use when only an EandM is performed on FOLLOW-UP visit 1 0 ASSESSMENTS - Nursing Assessment / Reassessment X - Reassessment of Co-morbidities (includes updates in patient status) 1 10 X- 1 5 Reassessment of Adherence to Treatment Plan ASSESSMENTS - Wound and Skin Assessment / Reassessment []  - Simple Wound Assessment / Reassessment - one wound 0 X- 2 5 Complex Wound Assessment / Reassessment - multiple wounds []  - 0 Dermatologic / Skin Assessment (not related to wound area) ASSESSMENTS - Focused Assessment []  - Circumferential Edema Measurements - multi extremities 0 []  - 0 Nutritional Assessment / Counseling / Intervention []  - 0 Lower Extremity Assessment (monofilament, tuning fork, pulses) []  - 0 Peripheral Arterial Disease Assessment (using hand held doppler) ASSESSMENTS - Ostomy and/or Continence Assessment and Care []  - Incontinence Assessment and Management 0 []  - 0 Ostomy Care Assessment and Management (repouching, etc.) PROCESS - Coordination of Care []  - Simple Patient / Family Education for ongoing care 0 X- 1 20 Complex (extensive) Patient / Family Education for ongoing care X- 1 10 Staff obtains Chiropractor, Records, Test Results / Process Orders X- 1 10 Staff telephones HHA, Nursing Homes / Clarify orders / etc []  - 0 Routine Transfer to another Facility (non-emergent condition) []  - 0 Routine Hospital Admission (non-emergent condition) []  - 0 New Admissions / Manufacturing engineer / Ordering NPWT, Apligraf, etc. []  - 0 Emergency Hospital Admission (emergent condition) []  - 0 Simple Discharge Coordination [name], [name] ([id number]) []  - 0 Complex (extensive) Discharge Coordination PROCESS - Special Needs []  - Pediatric / Minor Patient Management 0 []  - 0 Isolation Patient Management []  - 0 Hearing / Language / Visual special needs []  - 0 Assessment of Community assistance (transportation, D/C planning, etc.) []  - 0 Additional  assistance / Altered mentation []  - 0 Support Surface(s) Assessment (bed, cushion, seat, etc.) INTERVENTIONS - Wound Cleansing / Measurement []  - Simple Wound Cleansing - one wound 0 X- 2  5 Complex Wound Cleansing - multiple wounds X- 1 5 Wound Imaging (photographs - any number of wounds) []  - 0 Wound Tracing (instead of photographs) []  - 0 Simple Wound Measurement - one wound X- 2 5 Complex Wound Measurement - multiple wounds INTERVENTIONS - Wound Dressings X - Small Wound Dressing one or multiple wounds 2 10 []  - 0 Medium Wound Dressing one or multiple wounds []  - 0 Large Wound Dressing one or multiple wounds X- 1 5 Application of Medications - topical []  - 0 Application of Medications - injection INTERVENTIONS - Miscellaneous []  - External ear exam 0 []  - 0 Specimen Collection (cultures, biopsies, blood, body fluids, etc.) []  - 0 Specimen(s) / Culture(s) sent or taken to Lab for analysis X- 1 10 Patient Transfer (multiple staff / Nurse, adult / Similar devices) []  - 0 Simple Staple / Suture removal (25 or less) []  - 0 Complex Staple / Suture removal (26 or more) []  - 0 Hypo / Hyperglycemic Management (close monitor of Blood Glucose) []  - 0 Ankle / Brachial Index (ABI) - do not check if billed separately X- 1 5 Vital Signs [name], [name]. ([id number]) Has the patient been seen at the hospital within the last three years: Yes Total Score: 130 Level Of Care: New/Established - Level 4 Electronic Signature(s) Signed: 12/31/2017 4:32:30 PM By: Alejandro Mulling Entered By: Alejandro Mulling on 12/29/2017 12:57:16 [name] ([id number]) -------------------------------------------------------------------------------- Encounter Discharge Information Details Patient Name: [name] Date of Service: 12/29/2017 10:15 AM Medical Record Number: [id number] Patient Account Number: [id number] Date of Birth/Sex: March 17, 1923 (84 y.o. F) Treating RN: Phillis Haggis Primary Care Janari Gagner: Vonita Moss Other Clinician: Referring Elgin Carn: Vonita Moss Treating Kashif Pooler/Extender: [name], [name] in Treatment: 50 Encounter Discharge Information Items Discharge Pain Level: 0 Discharge Condition: Stable Ambulatory Status: Wheelchair Discharge Destination: Home Transportation: Private Auto Accompanied By: daughter Schedule Follow-up Appointment: Yes Medication Reconciliation completed and No provided to Patient/Care Woodfin Kiss: Provided on Clinical Summary of Care: 12/29/2017 Form Type Recipient Paper Patient ES Electronic Signature(s) Signed: 12/29/2017 3:09:28 PM By: Renne Crigler Entered By: Renne Crigler on 12/29/2017 11:28:43 [name] ([id number]) -------------------------------------------------------------------------------- Lower Extremity Assessment Details Patient Name: [name] Date of Service: 12/29/2017 10:15 AM Medical Record Number: [id number] Patient Account Number: [id number] Date of Birth/Sex: Mar 06, 1923 (84 y.o. F) Treating RN: [name] Primary Care Amore Grater: Vonita Moss Other Clinician: Referring Betsy Rosello: Vonita Moss Treating Paxtyn Wisdom/Extender: [name], [name] in Treatment: 50 Vascular Assessment Pulses: Dorsalis Pedis Palpable: [Left:Yes] Posterior Tibial Extremity colors, hair growth, and conditions: Extremity Color: [Left:Hyperpigmented] Hair Growth on Extremity: [Left:No] Temperature of Extremity: [Left:Warm] Capillary Refill: [Left:< 3 seconds] Electronic Signature(s) Signed: 12/29/2017 3:48:45 PM By: [name] Entered By: [name] on 12/29/2017 10:41:15 [name] ([id number]) -------------------------------------------------------------------------------- Multi Wound Chart Details Patient Name: [name] Date of Service: 12/29/2017 10:15 AM Medical Record Number: [id number] Patient Account Number: [id number] Date of Birth/Sex: 05/17/1923  (84 y.o. F) Treating RN: Phillis Haggis Primary Care Elowen Debruyn: Vonita Moss Other Clinician: Referring Eliora Nienhuis: Vonita Moss Treating Freeda Spivey/Extender: STONE III, [name] in Treatment: 50 Vital Signs Height(in): 60 Pulse(bpm): 56 Weight(lbs): 100 Blood Pressure(mmHg): 146/65 Body Mass Index(BMI): 20 Temperature(F): 98.1 Respiratory Rate 14 (breaths/min): Photos: [1:No Photos] [5:No Photos] [N/A:N/A] Wound Location: [1:Coccyx - Midline] [5:Left Lower Leg] [N/A:N/A] Wounding Event: [1:Pressure Injury] [5:Pressure Injury] [N/A:N/A] Primary Etiology: [1:Pressure Ulcer] [5:Pressure Ulcer] [N/A:N/A] Comorbid History: [1:Anemia, Hypertension, Dementia] [5:Anemia, Hypertension, Dementia] [N/A:N/A] Date Acquired: [1:12/01/2016] [5:12/09/2017] [N/A:N/A] [name] of Treatment: [1:50] [5:2] [  N/A:N/A] Wound Status: [1:Open] [5:Open] [N/A:N/A] Measurements L x [name] x D [1:3.7x3.3x0.4] [5:1.8x1.1x0.1] [N/A:N/A] (cm) Area (cm) : [1:9.59] [5:1.555] [N/A:N/A] Volume (cm) : [1:3.836] [5:0.156] [N/A:N/A] % Reduction in Area: [1:-130.80%] [5:12.00%] [N/A:N/A] % Reduction in Volume: [1:-130.80%] [5:11.90%] [N/A:N/A] Position 1 (o'clock): [1:12] Maximum Distance 1 (cm): [1:1.2] Starting Position 1 [1:5] (o'clock): Ending Position 1 [1:8] (o'clock): Maximum Distance 1 (cm): [1:1.2] Tunneling: [1:Yes] [5:No] [N/A:N/A] Undermining: [1:Yes] [5:No] [N/A:N/A] Classification: [1:Category/Stage IV] [5:Unstageable/Unclassified] [N/A:N/A] Exudate Amount: [1:Large] [5:Medium] [N/A:N/A] Exudate Type: [1:Serosanguineous] [5:Serous] [N/A:N/A] Exudate Color: [1:red, brown] [5:amber] [N/A:N/A] Foul Odor After Cleansing: [1:Yes] [5:No] [N/A:N/A] Odor Anticipated Due to [1:No] [5:N/A] [N/A:N/A] Product Use: Wound Margin: [1:Flat and Intact] [5:Distinct, outline attached] [N/A:N/A] Granulation Amount: [1:Medium (34-66%)] [5:Large (67-100%)] [N/A:N/A] Granulation Quality: [1:Red] [5:Pink]  [N/A:N/A] Necrotic Amount: [1:Medium (34-66%)] [5:None Present (0%)] [N/A:N/A] Exposed Structures: [1:Fat Layer (Subcutaneous Tissue) Exposed: Yes] [5:Fascia: No Fat Layer (Subcutaneous] [N/A:N/A] Muscle: Yes Tissue) Exposed: No Fascia: No Tendon: No Tendon: No Muscle: No Joint: No Joint: No Bone: No Bone: No Epithelialization: None Large (67-100%) N/A Periwound Skin Texture: Induration: Yes Excoriation: No N/A Excoriation: No Induration: No Callus: No Callus: No Crepitus: No Crepitus: No Rash: No Rash: No Scarring: No Scarring: No Periwound Skin Moisture: Maceration: No Maceration: No N/A Dry/Scaly: No Dry/Scaly: No Periwound Skin Color: Erythema: Yes Atrophie Blanche: No N/A Atrophie Blanche: No Cyanosis: No Cyanosis: No Ecchymosis: No Ecchymosis: No Erythema: No Hemosiderin Staining: No Hemosiderin Staining: No Mottled: No Mottled: No Pallor: No Pallor: No Rubor: No Rubor: No Erythema Location: Circumferential N/A N/A Temperature: No Abnormality N/A N/A Tenderness on Palpation: Yes No N/A Wound Preparation: Ulcer Cleansing: Ulcer Cleansing: N/A Rinsed/Irrigated with Saline Rinsed/Irrigated with Saline Topical Anesthetic Applied: Topical Anesthetic Applied: Other: lidocaine 4% None Treatment Notes Electronic Signature(s) Signed: 12/31/2017 4:32:30 PM By: Alejandro Mulling Entered By: Alejandro Mulling on 12/29/2017 10:51:54 [name] ([id number]) -------------------------------------------------------------------------------- Multi-Disciplinary Care Plan Details Patient Name: [name] Date of Service: 12/29/2017 10:15 AM Medical Record Number: [id number] Patient Account Number: [id number] Date of Birth/Sex: 1923-08-27 (84 y.o. F) Treating RN: Phillis Haggis Primary Care Marvella Jenning: Vonita Moss Other Clinician: Referring Daivik Overley: Vonita Moss Treating Bailley Guilford/Extender: [name], [name] in Treatment: 50 Active  Inactive ` Abuse / Safety / Falls / Self Care Management Nursing Diagnoses: Impaired physical mobility Potential for falls Goals: Patient will remain injury free Date Initiated: 01/07/2017 Target Resolution Date: 04/03/2017 Goal Status: Active Interventions: Assess fall risk on admission and as needed Notes: ` Nutrition Nursing Diagnoses: Potential for alteratiion in Nutrition/Potential for imbalanced nutrition Goals: Patient/caregiver agrees to and verbalizes understanding of need to use nutritional supplements and/or vitamins as prescribed Date Initiated: 01/07/2017 Target Resolution Date: 04/03/2017 Goal Status: Active Interventions: Assess patient nutrition upon admission and as needed per policy Notes: ` Orientation to the Wound Care Program Nursing Diagnoses: Knowledge deficit related to the wound healing center program Goals: Patient/caregiver will verbalize understanding of the Wound Healing Center Program Date Initiated: 01/07/2017 Target Resolution Date: 04/03/2017 Goal Status: Active [name], [name] ([id number]) Interventions: Provide education on orientation to the wound center Notes: ` Pressure Nursing Diagnoses: Knowledge deficit related to causes and risk factors for pressure ulcer development Goals: Patient will remain free from development of additional pressure ulcers Date Initiated: 01/07/2017 Target Resolution Date: 04/03/2017 Goal Status: Active Interventions: Assess potential for pressure ulcer upon admission and as needed Notes: ` Wound/Skin Impairment Nursing Diagnoses: Impaired tissue integrity Goals: Patient/caregiver will verbalize understanding of skin care regimen Date Initiated: 01/07/2017 Target Resolution Date:  04/03/2017 Goal Status: Active Ulcer/skin breakdown will have a volume reduction of 30% by week 4 Date Initiated: 01/07/2017 Target Resolution Date: 04/03/2017 Goal Status: Active Ulcer/skin breakdown will have a volume reduction  of 50% by week 8 Date Initiated: 01/07/2017 Target Resolution Date: 04/03/2017 Goal Status: Active Ulcer/skin breakdown will have a volume reduction of 80% by week 12 Date Initiated: 01/07/2017 Target Resolution Date: 04/03/2017 Goal Status: Active Ulcer/skin breakdown will heal within 14 [name] Date Initiated: 01/07/2017 Target Resolution Date: 04/03/2017 Goal Status: Active Interventions: Assess patient/caregiver ability to obtain necessary supplies Assess patient/caregiver ability to perform ulcer/skin care regimen upon admission and as needed Assess ulceration(s) every visit Notes: Electronic Signature(s) Tina MainlandSHAW, [name]. ([id number]) Signed: 12/31/2017 4:32:30 PM By: Alejandro MullingPinkerton, Debra Entered By: Alejandro MullingPinkerton, Debra on 12/29/2017 10:51:40 Tina MainlandSHAW, [name]. ([id number]) -------------------------------------------------------------------------------- Pain Assessment Details Patient Name: Tina MainlandSHAW, [name]. Date of Service: 12/29/2017 10:15 AM Medical Record Number: [id number] Patient Account Number: [id number] Date of Birth/Sex: 1923/08/24 (84 y.o. F) Treating RN: Tina Sitesorthy, Joanna Primary Care Tamotsu Wiederholt: Vonita MossRISSMAN, MARK Other Clinician: Referring Colby Catanese: Vonita MossRISSMAN, MARK Treating Haddon Fyfe/Extender: Tina DibblesSTONE III, [name] in Treatment: 50 Active Problems Location of Pain Severity and Description of Pain Patient Has Paino Patient Unable to Respond Site Locations Pain Management and Medication Current Pain Management: Electronic Signature(s) Signed: 12/29/2017 3:48:45 PM By: Tina Sitesorthy, Joanna Entered By: Tina Sitesorthy, Joanna on 12/29/2017 10:25:12 Tina MainlandSHAW, [name]. ([id number]) -------------------------------------------------------------------------------- Patient/Caregiver Education Details Patient Name: Tina MainlandSHAW, [name]. Date of Service: 12/29/2017 10:15 AM Medical Record Number: [id number] Patient Account Number: [id number] Date of Birth/Gender: 1923/08/24 (84 y.o. F) Treating RN: Renne CriglerFlinchum, Cheryl Primary  Care Physician: Vonita MossRISSMAN, MARK Other Clinician: Referring Physician: Vonita MossRISSMAN, MARK Treating Physician/Extender: Skeet SimmerSTONE III, [name] in Treatment: 50 Education Assessment Education Provided To: Patient and Caregiver daughter and caregiver Education Topics Provided Wound/Skin Impairment: Methods: Explain/Verbal Responses: State content correctly Electronic Signature(s) Signed: 12/29/2017 3:09:28 PM By: Renne CriglerFlinchum, Cheryl Entered By: Renne CriglerFlinchum, Cheryl on 12/29/2017 11:29:04 Tina MainlandSHAW, [name]. ([id number]) -------------------------------------------------------------------------------- Wound Assessment Details Patient Name: Tina MainlandSHAW, [name]. Date of Service: 12/29/2017 10:15 AM Medical Record Number: [id number] Patient Account Number: [id number] Date of Birth/Sex: 1923/08/24 (84 y.o. F) Treating RN: Tina Sitesorthy, Joanna Primary Care Shalynn Jorstad: Vonita MossRISSMAN, MARK Other Clinician: Referring Domini Vandehei: Vonita MossRISSMAN, MARK Treating Royale Lennartz/Extender: STONE III, [name] in Treatment: 50 Wound Status Wound Number: 1 Primary Etiology: Pressure Ulcer Wound Location: Coccyx - Midline Wound Status: Open Wounding Event: Pressure Injury Comorbid History: Anemia, Hypertension, Dementia Date Acquired: 12/01/2016 [name] Of Treatment: 50 Clustered Wound: No Photos Photo Uploaded By: Tina Sitesorthy, Joanna on 12/29/2017 11:43:45 Wound Measurements Length: (cm) 3.7 Width: (cm) 3.3 Depth: (cm) 0.4 Area: (cm) 9.59 Volume: (cm) 3.836 % Reduction in Area: -130.8% % Reduction in Volume: -130.8% Epithelialization: None Tunneling: Yes Position (o'clock): 12 Maximum Distance: (cm) 1.2 Undermining: Yes Starting Position (o'clock): 5 Ending Position (o'clock): 8 Maximum Distance: (cm) 1.2 Wound Description Classification: Category/Stage IV Wound Margin: Flat and Intact Exudate Amount: Large Exudate Type: Serosanguineous Exudate Color: red, brown Foul Odor After Cleansing: Yes Due to Product Use: No Slough/Fibrino  Yes Wound Bed Granulation Amount: Medium (34-66%) Exposed Structure Granulation Quality: Red Fascia Exposed: No Tina MainlandSHAW, [name]. ([id number]) Necrotic Amount: Medium (34-66%) Fat Layer (Subcutaneous Tissue) Exposed: Yes Necrotic Quality: Adherent Slough Tendon Exposed: No Muscle Exposed: Yes Necrosis of Muscle: No Joint Exposed: No Bone Exposed: No Periwound Skin Texture Texture Color No Abnormalities Noted: No No Abnormalities Noted: No Callus: No Atrophie Blanche: No Crepitus: No Cyanosis: No Excoriation: No Ecchymosis: No Induration: Yes Erythema: Yes Rash: No Erythema  Location: Circumferential Scarring: No Hemosiderin Staining: No Mottled: No Moisture Pallor: No No Abnormalities Noted: No Rubor: No Dry / Scaly: No Maceration: No Temperature / Pain Temperature: No Abnormality Tenderness on Palpation: Yes Wound Preparation Ulcer Cleansing: Rinsed/Irrigated with Saline Topical Anesthetic Applied: Other: lidocaine 4%, Treatment Notes Wound #1 (Midline Coccyx) 1. Cleansed with: Clean wound with Normal Saline 2. Anesthetic Topical Lidocaine 4% cream to wound bed prior to debridement 3. Peri-wound Care: Antifungal powder 4. Dressing Applied: Prisma Ag 5. Secondary Dressing Applied Bordered Foam Dressing Electronic Signature(s) Signed: 12/29/2017 3:48:45 PM By: [name] Entered By: [name] on 12/29/2017 10:40:29 [name] ([id number]) -------------------------------------------------------------------------------- Wound Assessment Details Patient Name: [name] Date of Service: 12/29/2017 10:15 AM Medical Record Number: [id number] Patient Account Number: [id number] Date of Birth/Sex: [birth year] (84 y.o. F) Treating RN: [name] Primary Care Tyla Burgner: Vonita Moss Other Clinician: Referring Brigitte Soderberg: Vonita Moss Treating Aylee Littrell/Extender: STONE III, [name] in Treatment: 50 Wound Status Wound Number: 5 Primary  Etiology: Pressure Ulcer Wound Location: Left Lower Leg Wound Status: Open Wounding Event: Pressure Injury Comorbid History: Anemia, Hypertension, Dementia Date Acquired: 12/09/2017 [name] Of Treatment: 2 Clustered Wound: No Photos Photo Uploaded By: [name] on 12/29/2017 11:43:45 Wound Measurements Length: (cm) 1.8 Width: (cm) 1.1 Depth: (cm) 0.1 Area: (cm) 1.555 Volume: (cm) 0.156 % Reduction in Area: 12% % Reduction in Volume: 11.9% Epithelialization: Large (67-100%) Tunneling: No Undermining: No Wound Description Classification: Unstageable/Unclassified Wound Margin: Distinct, outline attached Exudate Amount: Medium Exudate Type: Serous Exudate Color: amber Foul Odor After Cleansing: No Slough/Fibrino Yes Wound Bed Granulation Amount: Large (67-100%) Exposed Structure Granulation Quality: Pink Fascia Exposed: No Necrotic Amount: None Present (0%) Fat Layer (Subcutaneous Tissue) Exposed: No Tendon Exposed: No Muscle Exposed: No Joint Exposed: No Bone Exposed: No Periwound Skin Texture [name], [name]. ([id number]) Texture Color No Abnormalities Noted: No No Abnormalities Noted: No Callus: No Atrophie Blanche: No Crepitus: No Cyanosis: No Excoriation: No Ecchymosis: No Induration: No Erythema: No Rash: No Hemosiderin Staining: No Scarring: No Mottled: No Pallor: No Moisture Rubor: No No Abnormalities Noted: No Dry / Scaly: No Maceration: No Wound Preparation Ulcer Cleansing: Rinsed/Irrigated with Saline Topical Anesthetic Applied: None Treatment Notes Wound #5 (Left Lower Leg) 1. Cleansed with: Clean wound with Normal Saline 4. Dressing Applied: Xeroform 5. Secondary Dressing Applied Dry Gauze Kerlix/Conform Notes xeroform gauze to left lower leg and wrap with coban and tape with stretch net. Electronic Signature(s) Signed: 12/29/2017 3:48:45 PM By: [name] Entered By: [name] on 12/29/2017 10:40:48 [name] ([id number]) -------------------------------------------------------------------------------- Vitals Details Patient Name: [name] Date of Service: 12/29/2017 10:15 AM Medical Record Number: [id number] Patient Account Number: [id number] Date of Birth/Sex: 03/31/1923 (84 y.o. F) Treating RN: [name] Primary Care Koa Palla: Vonita Moss Other Clinician: Referring Flecia Shutter: Vonita Moss Treating Cincere Deprey/Extender: [name], [name] in Treatment: 50 Vital Signs Time Taken: 10:22 Temperature (F): 98.1 Height (in): 60 Pulse (bpm): 56 Weight (lbs): 100 Respiratory Rate (breaths/min): 14 Body Mass Index (BMI): 19.5 Blood Pressure (mmHg): 146/65 Reference Range: 80 - 120 mg / dl Electronic Signature(s) Signed: 12/29/2017 3:48:45 PM By: [name] Entered By: [name] on 12/29/2017 10:25:31

## 2018-01-01 NOTE — Progress Notes (Signed)
Tina, [name] ([id number]) Visit Report for 12/29/2017 Chief Complaint Document Details Patient Name: Tina, [name] Date of Service: 12/29/2017 10:15 AM Medical Record Number: [id number] Patient Account Number: [id number] Date of Birth/Sex: 04/21/1923 (84 y.o. F) Treating RN: Phillis Haggis Primary Care Provider: Vonita Moss Other Clinician: Referring Provider: Vonita Moss Treating Provider/Extender: Linwood Dibbles, HOYT Weeks in Treatment: 50 Information Obtained from: Patient Chief Complaint patient is here for follow up evaluation of a sacral pressure ulcer Electronic Signature(s) Signed: 12/29/2017 5:42:34 PM By: Lenda Kelp PA-C Entered By: Lenda Kelp on 12/29/2017 10:21:58 [name] ([id number]) -------------------------------------------------------------------------------- HPI Details Patient Name: [name] Date of Service: 12/29/2017 10:15 AM Medical Record Number: [id number] Patient Account Number: [id number] Date of Birth/Sex: 04-04-1923 (84 y.o. F) Treating RN: Phillis Haggis Primary Care Provider: Vonita Moss Other Clinician: Referring Provider: Vonita Moss Treating Provider/Extender: Linwood Dibbles, HOYT Weeks in Treatment: 50 History of Present Illness HPI Description: 01/07/17 this is a 84 year old woman admitted to the clinic today for review of a pressure ulcer on her lower sacrum. She is referred from her primary physician's office after being seen on 3/22 with a 3 cm pressure area. Her daughter and caretaker accompanied her today state that the area first became obvious about a month ago and his since deteriorated. They have recently got [name] home health involved and have been using [name] to the wound. They have ordered a pressure relief surface for her mattress. They are turning her religiously. They state that she eats well and they've been forcing fluids on her. She is on a multivitamin. Looking through [facility] point last  albumin I see was 4.4 on 10/28. The patient has advanced parkinsonism which looks superficially like advanced Parkinson's disease although her daughter tells me she did not ever respond to Sinemet therefore this may have another pathology with signs of parkinsonism. However I think this is largely a mute point currently. She also has dementia and is nonambulatory. Since this started they have been keeping her in bed and turning her religiously every 2 hours. She lives at home in [place] with her husband with 24/7 care giving 01/13/17 santyl change qd. Still will require further debridement. continue santyl. 01/20/17; patient's wound actually looks some better less adherent necrotic surface. There is actually visible granulation. We're using [name] 01/27/17; better-looking surface but still a lot of necrotic tissue on the base of this wound. The periwound erythema is better than last week we are still using [name]. Her daughter tells [place] that she is still having trouble with the pressure-relief mattress through medical modalities 02/03/18; I had the patient scheduled for a two week followup however her daughter brought her in early concerned for discoloration on 2 areas of the wound circumference. We have bee using santyl 02/10/17;Better looking surface to the wound. Rim appears better suggesting better offloading. Using santyl 02/24/17; change to Silver Collegen last time. Wound appears better. 03/10/17; still using silver collagen religious offloading. Intake is satisfactory per her daughter. Dimension slightly better 03/12/2017 -- Dr. Jannetta Quint patient who had been seen 2 days ago and was doing fairly well. The patient is brought in by her daughter who noticed a new wound just above the previous wound on her sacral area and going on more to the left lateral side. She was very concerned and we asked her to get in for an opinion. 03/17/17; above is noted. The patient has developed a progressive area to the  left of her original wound. This  seems to this started with a ring of red skin with a more pale interior almost looking fungal. There was a rim of blister through part of the area although this did not look like zoster. They have been applying triamcinolone that was prescribed last week by [name] and the area has a fold to a linear band area which is confluent, erythematous and with obvious epidermal swelling but there is no overt tenderness or crepitus. She has lost some surface epithelium closer to the wound surface itself and now has a more superficial wound in this area and the extending erythema goes towards the left buttock. This is well demarcated between involved in normal skin but once again does not appear to be at all tender. If there is a contact issue here I cannot get the history out of the daughter or the caregiver that are with her. 03/24/17; the patient arrives today with the wound slightly worse slightly more drainage. The bandlike area of erythema that I treated as a possible pineal infection has improved somewhat although proximally is still has confluent erythema without overt tenderness. We have been using silver alginate since the most recent deterioration. To the bandlike degree of erythema we have been using Lotrisone cream 03/31/17; patient arrives today with the wound slightly larger, necrotic surface and surrounding erythema. The bandlike area of erythema that I treated as a possible pineal infection is less swollen but still present I've been using Lotrisone cream on that largely related to the presence of a tinea looking infection when this was first seen. We've been using silver alginate. X-ray that I ordered last week showed no acute bony abnormalities mild fecal impaction. Lab work showed a comprehensive metabolic panel that was normal including an albumin of 3.8. White count was 9.5 hemoglobin 12.3 differential count normal. C-reactive protein was less than 1 and  sedimentation rate and 17. The latter 2 values does not support an ongoing bacterial infection. 04/14/17; patient arrives after a 2 week hiatus. Her wound is not in good condition. Although the base of the wound looks [name], [name] ([id number]) stable she still has an erythematous area that was apparently blistered over the weekend. This again points to the left. As our intake nurse pointed out today this is in the area where the tissue folds together and we may need to prevent try to prevent this. Lab work and x-ray that I did to 3 weeks ago were unremarkable including her albumin nevertheless she is an extremely frail condition physically. We have have been using [name] 04/22/17; I changed her to silver alginate because of the surrounding maceration and moisture last week. The daughter did not like the way the wound looked in the middle of the week and changed her back to [place]. They're putting gauze on top of this. Thinks still using Lotrisone. 05/06/17 on evaluation today patient sacral wound appears to be doing okay and does not seem to be any worse. She is having no significant pain during evaluation today the secondary to mental status she is unable to rate or describe whether she had any pain she was not however flinching. Her daughter states that the wound does appear to be looking better to her. Still we are having difficulty with the skinfold that seems to be closing in on itself at this point. All in all I feel like she is making some good progress in the [name] seems to be the official for her. They do tell me that a refill if  we are gonna continue that today. No fevers, chills, nausea, or vomiting noted at this time. 05/13/17 presents today for evaluation concerning her ongoing sacral pressure ulcer. Unfortunately she also has an area of deep tissue injury in the right Ischial region which is starting to show up. The sacral wound also continues to show signs of necrotic tissue  overlying and has declined. Overall we really have not seen a significant improvement in the past several months in regard to the sacral wound and now patient is starting to develop a new wound in the right Ischial region. Obviously this is not trending in the direction that we want to see. No fevers, chills, nausea, or vomiting noted at this time. 05/19/17; I have not seen this wound and almost a month however there is nothing really positive to say about it. Necrotic tissue over the surface which superiorly I think abuts on her sacrum. She has surrounding erythema. I would be surprised if there is not underlying osteomyelitis or soft tissue infection. 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 She apparently eats well per description although I wonder about this looking at her. Lab work I did probably 4 weeks ago however was really quite normal including a serum albumin 05/26/17; culture I did last week grew Escherichia coli and methicillin sensitive staph aureus which should've been well covered by the Augmentin and ciprofloxacin. Indeed the erythema around the wound in the bed of the wound looks somewhat better. Her intake is still satisfactory. They now have a near fluidized bed 06/02/17; they completed the antibiotics last Friday. Using collagen. Daughter still reports eating and drinking well. There is less visualized erythema around the wound. 06/16/17; large pressure ulcer over her lower sacrum and coccyx. Using [name] to the wound bed. 06/30/17; certainly no change in dimensions of this large stage III wound over her sacrum and coccyx. They've been using [name]. There is no exposed bone. She has a candidal/tinea area in the right inguinal area. Other than that her daughter relates that she is eating and drinking well there changing her positioning to make sure the areas offloaded 07/14/17; no major change in the dimensions of this large stage III wound. Initially a smaller wound that  became secondarily infected causing significant tissue breakdown although it is been stable in the last several weeks. Tunneling superiorly at roughly 1:00 no change here either. There is no bone palpable. Both the patient's daughter and caretaker states that she eats well. I have not rechecked her blood work 07/27/17; patient arrives in clinic today and generally a deteriorated looking state. Mild fever with axillary temperature of 100.4. Daughter reports she has not been eating and drinking well since yesterday. She looks more pale and thin and less responsive. We have been using silver collagen to her wound 08/11/17; since the last time the patient was here things have gone better. Her fever went down and she started eating and drinking again. Culture I did of the wound showed methicillin sensitive staph aureus, Morganella and enterococcus. I only treated her with Keflex which would've not covered the Morganella and enterococcus however the purulent area on the 2:00 side of her wound is a lot better and the bandlike erythema that concern me also was resolved. I'd called the daughter last week to confirm that she was a lot better. She finished the Keflex last Thursday she also suffered a skin tear this morning perhaps while putting on her incontinence brief period is on the lateral aspect of  her right leg. Clean wound with the surface epithelium not viable. 08/25/17; last week the patient was noted to have erythema around the wound margin and a slight fever which the patient's daughter says was 50. Our office was contacted by home health however we did not have a space to work the patient in that she went to see her primary physician Dr. Maurice March. She was not febrile during this visit on 08/21/17 there was erythema around the wound similar to last occasion. Dr. Maurice March in reference to my culture from 07/28/17 and put her on doxycycline capsules which they're opening twice a day for 10 days. 09/17/17;  patient arrives today with the wound bed looking fairly well granulated. There is undermining from 4 to 6:00 although this seems to of contracted slightly. She does not have obvious infection although the daughter states there was some darkening of the wound circumference that is not evident today. They state she is eating well. They are concerned about oral thrush 09/30/16; very fibrin looking granulated wound bed. Her undermining from 4 to 6:00 is about the same but also appears to be well granulated. She has rolled edges of senescent tissue from roughly 7 to 12:00. There is no evidence of infection [name], [name] ([id number]) 10/13/17 on evaluation today patient appears to be doing fairly well all things considered in regard to her sacral wound. There's really not a lot of significant change or improvement she does have some evidence of contusion and deep tissue injury around the left border of the wound that patient's daughter did inquire about today. Nonetheless overall the wound appears to be doing about the same in my opinion. There is no significant indication of infection there also is no significant slough noted at this point. 10/27/17 She is here in follow up evaluation of a sacral ulcer. She is accompanied by her daughter and caregiver. There is red granulation tissue throughout, persistent discoloration to left border; this appears consistent with deep tissue injury. There are multiple areas covered in foam borders, tegaderm, etc that are "preventative" with "no wounds". We will continue with prisma and continue with two week follow ups 11/17/17 on evaluation today patient appears to be doing decently well in regard to her wound at this point. She continues to have a sacral wound ulcer. We see her roughly every two weeks. In the last week she was actually in the hospital due to what was diagnosed as sepsis although no organisms were ever identified in the actual blood cultures. The  hospital really was not sure that the wound was the cause of the infection but they really did not figure out anything else that would be a causative organism she did have a CT scan and that revealed no evidence of pneumonia there was also no evidence of urinary tract infection. Again it very well could have been the wound called in those although wound appears to be doing fairly well at this point. 12/15/17 on evaluation today patient actually appears to be doing about the same in regard to her sacral ulcer. The one thing different is that she does seem to have a rash where her right arm is contracted and being held to the thorax. The area underlying both on the ventral side of the arm as well is the thorax where it comes in contact shows evidence of a rash which appears to be fungal in nature. There does not appear to be any evidence of infection lies at this point. There does not  appear to be a rash consistent with shingles which was also of concern initially. No fevers, chills, nausea, or vomiting noted at this time. 12/29/17 on evaluation today patient appears to be doing a little better in regard to her sacral wound although the one changes the 12 o'clock location of the wound seems to have attached at one point which no longer allows this to pull back. This has caused an area of undermining that seems to be attaching as well in the 12 o'clock location. I do think that this is something that can be managed and is not necessarily a bad thing. Nonetheless she does seem to have some pain with exploration of this region of undermining. She continues to have an area under her right arm on the chest wall of erythema although this is a little better especially after the home health nurse is actually got in order for Diflucan times one for the patient. She is a new area on the left wrist that looks like because of the contraction her nail may have pushed in on her wrist area causing a slight cut which  subsequently became infected. She has some honey crusted drainage noted. Electronic Signature(s) Signed: 12/29/2017 5:42:34 PM By: Lenda KelpStone III, Hoyt PA-C Entered By: Lenda KelpStone III, Hoyt on 12/29/2017 11:45:13 [name], [name] ([id number]) -------------------------------------------------------------------------------- Physical Exam Details Patient Name: [name], Rakeya W. Date of Service: 12/29/2017 10:15 AM Medical Record Number: [id number] Patient Account Number: [id number] Date of Birth/Sex: 19-Apr-1923 (84 y.o. F) Treating RN: Phillis HaggisPinkerton, Debi Primary Care Provider: Vonita MossRISSMAN, MARK Other Clinician: Referring Provider: Vonita MossRISSMAN, MARK Treating Provider/Extender: STONE III, HOYT Weeks in Treatment: 50 Constitutional Chronically ill appearing but in no apparent acute distress. Respiratory normal breathing without difficulty. clear to auscultation bilaterally. Cardiovascular regular rate and rhythm with normal S1, S2. Psychiatric Patient is not able to cooperate in decision making regarding care. Patient has dementia. patient is confused. Notes At this point patient's wound again appears to be doing somewhat better which is good news. Obviously this is very slow process however she does have some undermining no debridement was performed that we will continue to pack Prisma underneath the region of undermining in particular. Otherwise my hope is that she will continue to show signs of improvement week by week as we see and evaluate her. I'm gonna recommend that we continue the nystatin under the right arm/chest wall as I feel that this is helpful there still erythema and some work to be done in that regard. Lastly the patient's left wrist appears to be a slight impetigo there is no specific wound but I do believe she would benefit from Bactroban ointment. Electronic Signature(s) Signed: 12/29/2017 5:42:34 PM By: Lenda KelpStone III, Hoyt PA-C Entered By: Lenda KelpStone III, Hoyt on 12/29/2017 11:46:21 [name], Nashaly W.  ([id number]) -------------------------------------------------------------------------------- Physician Orders Details Patient Name: [name], Dawnn W. Date of Service: 12/29/2017 10:15 AM Medical Record Number: [id number] Patient Account Number: [id number] Date of Birth/Sex: 19-Apr-1923 (84 y.o. F) Treating RN: Phillis HaggisPinkerton, Debi Primary Care Provider: Vonita MossRISSMAN, MARK Other Clinician: Referring Provider: Vonita MossRISSMAN, MARK Treating Provider/Extender: Linwood DibblesSTONE III, HOYT Weeks in Treatment: 50 Verbal / Phone Orders: Yes Clinician: Ashok CordiaPinkerton, Debi Read Back and Verified: Yes Diagnosis Coding ICD-10 Coding Code Description L89.153 Pressure ulcer of sacral region, stage 3 G20 Parkinson's disease L03.312 Cellulitis of back [any part except buttock] S81.811D Laceration without foreign body, right lower leg, subsequent encounter Wound Cleansing Wound #1 Midline Coccyx o Clean wound with Normal Saline. o May Shower, gently pat wound dry prior to applying  new dressing. Wound #5 Left Lower Leg o Clean wound with Normal Saline. o May Shower, gently pat wound dry prior to applying new dressing. Anesthetic (add to Medication List) Wound #1 Midline Coccyx o Topical Lidocaine 4% cream applied to wound bed prior to debridement (In Clinic Only). Skin Barriers/Peri-Wound Care Wound #1 Midline Coccyx o Skin Prep - use only where the allevyn sticks not on reddened areas o Antifungal powder-Nystatin - around peri-wound on coccyx (do not get in wound), under right breast and right arm, right abdomen o Other: - bactroban ointment to left arm Primary Wound Dressing Wound #1 Midline Coccyx o Silver Collagen - prisma ag and pack into undermining as well slightly moisten with saline Wound #5 Left Lower Leg o Xeroform Secondary Dressing Wound #1 Midline Coccyx o Dry Gauze o Boardered Foam Dressing - HHRN PLEASE ORDER ALLEVYN LIFE BORDERED FOAM FOR PATIENT (OFF BRANDS IRRITATE HER  SKIN) Wound #5 Left Lower Leg Crocker, Yelena W. ([id number]) o Other - coban, tape, and stretch net Dressing Change Frequency Wound #1 Midline Coccyx o Change dressing every day. Wound #5 Left Lower Leg o Change dressing every other day. Follow-up Appointments o Return Appointment in 2 weeks. Off-Loading Wound #1 Midline Coccyx o Roho cushion for wheelchair o Mattress - fluidized air mattress o Turn and reposition every 2 hours Additional Orders / Instructions Wound #1 Midline Coccyx o Increase protein intake. - please add protein supplements to patients diet o Other: - please add vitamin A, vitamin C and zinc supplements to patients diet Home Health Wound #1 Midline Coccyx o Continue Home Health Visits - [facility] o Home Health Nurse may visit PRN to address patientos wound care needs. o FACE TO FACE ENCOUNTER: MEDICARE and MEDICAID PATIENTS: I certify that this patient is under my care and that I had a face-to-face encounter that meets the physician face-to-face encounter requirements with this patient on this date. The encounter with the patient was in whole or in part for the following MEDICAL CONDITION: (primary reason for Home Healthcare) MEDICAL NECESSITY: I certify, that based on my findings, NURSING services are a medically necessary home health service. HOME BOUND STATUS: I certify that my clinical findings support that this patient is homebound (i.e., Due to illness or injury, pt requires aid of supportive devices such as crutches, cane, wheelchairs, walkers, the use of special transportation or the assistance of another person to leave their place of residence. There is a normal inability to leave the home and doing so requires considerable and taxing effort. Other absences are for medical reasons / religious services and are infrequent or of short duration when for other reasons). o If current dressing causes regression in wound condition, may  D/C ordered dressing product/s and apply Normal Saline Moist Dressing daily until next Wound Healing Center / Other MD appointment. Notify Wound Healing Center of regression in wound condition at [phone]. o Please direct any NON-WOUND related issues/requests for orders to patient's Primary Care Physician - Dr Vonita Moss Patient Medications Allergies: Phenergan, Sulfa (Sulfonamide Antibiotics) Notifications Medication Indication Start End lidocaine DOSE 1 - topical 4 % cream - 1 cream topical mupirocin 12/29/2017 DOSE topical 2 % ointment - ointment topical applied to the wound bed in a thin film 2 times a day until healed nystatin 12/29/2017 DOSE topical 100,000 unit/gram powder - powder topical applied to the sacral peri-wound and the right chest wall daily. SHANDIIN, EISENBEIS ([id number]) Electronic Signature(s) Signed: 12/29/2017 3:36:33 PM By: Lenda Kelp PA-C Previous  Signature: 12/29/2017 11:48:04 AM Version By: Lenda Kelp PA-C Entered By: Lenda Kelp on 12/29/2017 15:36:32 [name] ([id number]) -------------------------------------------------------------------------------- Prescription 12/29/2017 Patient Name: [name] Provider: Lenda Kelp PA-C Date of Birth: 09/01/1923 NPI#: [id number] Sex: F DEA#: [id number] Phone #: [phone] License #: Patient Address: [facility] [address] [facility] [place], [place] [address] [address], [address] [place], [place] [address] [phone] Allergies Phenergan Sulfa (Sulfonamide Antibiotics) Medication Medication: Route: Strength: Form: lidocaine 4 % topical cream topical 4% cream Class: TOPICAL LOCAL ANESTHETICS Dose: Frequency / Time: Indication: 1 1 cream topical Number of Refills: Number of Units: 0 Generic Substitution: Start Date: End Date: One Time Use: Substitution Permitted No Note to  Pharmacy: Signature(s): Date(s): Electronic Signature(s) Signed: 12/29/2017 5:42:34 PM By: Lenda Kelp PA-C Entered By: Lenda Kelp on 12/29/2017 15:36:35 [name] ([id number]) --------------------------------------------------------------------------------  Problem List Details Patient Name: [name] Date of Service: 12/29/2017 10:15 AM Medical Record Number: [id number] Patient Account Number: [id number] Date of Birth/Sex: Mar 21, 1923 (84 y.o. F) Treating RN: Phillis Haggis Primary Care Provider: Vonita Moss Other Clinician: Referring Provider: Vonita Moss Treating Provider/Extender: Linwood Dibbles, HOYT Weeks in Treatment: 50 Active Problems ICD-10 Impacting Encounter Code Description Active Date Wound Healing Diagnosis L89.153 Pressure ulcer of sacral region, stage 3 01/07/2017 Yes G20 Parkinson's disease 01/07/2017 Yes L03.312 Cellulitis of back [any part except buttock] 07/28/2017 Yes S81.811D Laceration without foreign body, right lower leg, subsequent 08/11/2017 Yes encounter Inactive Problems Resolved Problems Electronic Signature(s) Signed: 12/29/2017 5:42:34 PM By: Lenda Kelp PA-C Entered By: Lenda Kelp on 12/29/2017 10:21:47 [name] ([id number]) -------------------------------------------------------------------------------- Progress Note Details Patient Name: [name] Date of Service: 12/29/2017 10:15 AM Medical Record Number: [id number] Patient Account Number: [id number] Date of Birth/Sex: 1923-03-15 (84 y.o. F) Treating RN: Phillis Haggis Primary Care Provider: Vonita Moss Other Clinician: Referring Provider: Vonita Moss Treating Provider/Extender: Linwood Dibbles, HOYT Weeks in Treatment: 50 Subjective Chief Complaint Information obtained from Patient patient is here for follow up evaluation of a sacral pressure ulcer History of Present Illness (HPI) 01/07/17 this is a 84 year old woman admitted to the clinic  today for review of a pressure ulcer on her lower sacrum. She is referred from her primary physician's office after being seen on 3/22 with a 3 cm pressure area. Her daughter and caretaker accompanied her today state that the area first became obvious about a month ago and his since deteriorated. They have recently got [name] home health involved and have been using [name] to the wound. They have ordered a pressure relief surface for her mattress. They are turning her religiously. They state that she eats well and they've been forcing fluids on her. She is on a multivitamin. Looking through [facility] point last albumin I see was 4.4 on 10/28. The patient has advanced parkinsonism which looks superficially like advanced Parkinson's disease although her daughter tells me she did not ever respond to Sinemet therefore this may have another pathology with signs of parkinsonism. However I think this is largely a mute point currently. She also has dementia and is nonambulatory. Since this started they have been keeping her in bed and turning her religiously every 2 hours. She lives at home in [place] with her husband with 24/7 care giving 01/13/17 santyl change qd. Still will require further debridement. continue santyl. 01/20/17; patient's wound actually looks some better less adherent necrotic surface. There is actually visible granulation.  We're using [name] 01/27/17; better-looking surface but still a lot of necrotic tissue on the base of this wound. The periwound erythema is better than last week we are still using [name]. Her daughter tells [place] that she is still having trouble with the pressure-relief mattress through medical modalities 02/03/18; I had the patient scheduled for a two week followup however her daughter brought her in early concerned for discoloration on 2 areas of the wound circumference. We have bee using santyl 02/10/17;Better looking surface to the wound. Rim appears better  suggesting better offloading. Using santyl 02/24/17; change to Silver Collegen last time. Wound appears better. 03/10/17; still using silver collagen religious offloading. Intake is satisfactory per her daughter. Dimension slightly better 03/12/2017 -- Dr. Jannetta Quint patient who had been seen 2 days ago and was doing fairly well. The patient is brought in by her daughter who noticed a new wound just above the previous wound on her sacral area and going on more to the left lateral side. She was very concerned and we asked her to get in for an opinion. 03/17/17; above is noted. The patient has developed a progressive area to the left of her original wound. This seems to this started with a ring of red skin with a more pale interior almost looking fungal. There was a rim of blister through part of the area although this did not look like zoster. They have been applying triamcinolone that was prescribed last week by Dr. Meyer Russel and the area has a fold to a linear band area which is confluent, erythematous and with obvious epidermal swelling but there is no overt tenderness or crepitus. She has lost some surface epithelium closer to the wound surface itself and now has a more superficial wound in this area and the extending erythema goes towards the left buttock. This is well demarcated between involved in normal skin but once again does not appear to be at all tender. If there is a contact issue here I cannot get the history out of the daughter or the caregiver that are with her. 03/24/17; the patient arrives today with the wound slightly worse slightly more drainage. The bandlike area of erythema that I treated as a possible pineal infection has improved somewhat although proximally is still has confluent erythema without overt tenderness. We have been using silver alginate since the most recent deterioration. To the bandlike degree of erythema we have been using Lotrisone cream 03/31/17; patient arrives today  with the wound slightly larger, necrotic surface and surrounding erythema. The bandlike area of erythema that I treated as a possible pineal infection is less swollen but still present I've been using Lotrisone cream on that largely related to the presence of a tinea looking infection when this was first seen. We've been using silver alginate. [name], [name] ([id number]) X-ray that I ordered last week showed no acute bony abnormalities mild fecal impaction. Lab work showed a comprehensive metabolic panel that was normal including an albumin of 3.8. White count was 9.5 hemoglobin 12.3 differential count normal. C-reactive protein was less than 1 and sedimentation rate and 17. The latter 2 values does not support an ongoing bacterial infection. 04/14/17; patient arrives after a 2 week hiatus. Her wound is not in good condition. Although the base of the wound looks stable she still has an erythematous area that was apparently blistered over the weekend. This again points to the left. As our intake nurse pointed out today this is in the area where the tissue folds  together and we may need to prevent try to prevent this. Lab work and x-ray that I did to 3 weeks ago were unremarkable including her albumin nevertheless she is an extremely frail condition physically. We have have been using [name] 04/22/17; I changed her to silver alginate because of the surrounding maceration and moisture last week. The daughter did not like the way the wound looked in the middle of the week and changed her back to Dulce. They're putting gauze on top of this. Thinks still using Lotrisone. 05/06/17 on evaluation today patient sacral wound appears to be doing okay and does not seem to be any worse. She is having no significant pain during evaluation today the secondary to mental status she is unable to rate or describe whether she had any pain she was not however flinching. Her daughter states that the wound does appear to  be looking better to her. Still we are having difficulty with the skinfold that seems to be closing in on itself at this point. All in all I feel like she is making some good progress in the [name] seems to be the official for her. They do tell me that a refill if we are gonna continue that today. No fevers, chills, nausea, or vomiting noted at this time. 05/13/17 presents today for evaluation concerning her ongoing sacral pressure ulcer. Unfortunately she also has an area of deep tissue injury in the right Ischial region which is starting to show up. The sacral wound also continues to show signs of necrotic tissue overlying and has declined. Overall we really have not seen a significant improvement in the past several months in regard to the sacral wound and now patient is starting to develop a new wound in the right Ischial region. Obviously this is not trending in the direction that we want to see. No fevers, chills, nausea, or vomiting noted at this time. 05/19/17; I have not seen this wound and almost a month however there is nothing really positive to say about it. Necrotic tissue over the surface which superiorly I think abuts on her sacrum. She has surrounding erythema. I would be surprised if there is not underlying osteomyelitis or soft tissue infection. 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 She apparently eats well per description although I wonder about this looking at her. Lab work I did probably 4 weeks ago however was really quite normal including a serum albumin 05/26/17; culture I did last week grew Escherichia coli and methicillin sensitive staph aureus which should've been well covered by the Augmentin and ciprofloxacin. Indeed the erythema around the wound in the bed of the wound looks somewhat better. Her intake is still satisfactory. They now have a near fluidized bed 06/02/17; they completed the antibiotics last Friday. Using collagen. Daughter still reports eating  and drinking well. There is less visualized erythema around the wound. 06/16/17; large pressure ulcer over her lower sacrum and coccyx. Using [name] to the wound bed. 06/30/17; certainly no change in dimensions of this large stage III wound over her sacrum and coccyx. They've been using [name]. There is no exposed bone. She has a candidal/tinea area in the right inguinal area. Other than that her daughter relates that she is eating and drinking well there changing her positioning to make sure the areas offloaded 07/14/17; no major change in the dimensions of this large stage III wound. Initially a smaller wound that became secondarily infected causing significant tissue breakdown although it is been stable in  the last several weeks. Tunneling superiorly at roughly 1:00 no change here either. There is no bone palpable. Both the patient's daughter and caretaker states that she eats well. I have not rechecked her blood work 07/27/17; patient arrives in clinic today and generally a deteriorated looking state. Mild fever with axillary temperature of 100.4. Daughter reports she has not been eating and drinking well since yesterday. She looks more pale and thin and less responsive. We have been using silver collagen to her wound 08/11/17; since the last time the patient was here things have gone better. Her fever went down and she started eating and drinking again. Culture I did of the wound showed methicillin sensitive staph aureus, Morganella and enterococcus. I only treated her with Keflex which would've not covered the Morganella and enterococcus however the purulent area on the 2:00 side of her wound is a lot better and the bandlike erythema that concern me also was resolved. I'd called the daughter last week to confirm that she was a lot better. She finished the Keflex last Thursday she also suffered a skin tear this morning perhaps while putting on her incontinence brief period is on the lateral  aspect of her right leg. Clean wound with the surface epithelium not viable. 08/25/17; last week the patient was noted to have erythema around the wound margin and a slight fever which the patient's daughter says was 48. Our office was contacted by home health however we did not have a space to work the patient in that she went to see her primary physician Dr. Maurice March. She was not febrile during this visit on 08/21/17 there was erythema around the wound similar to last occasion. Dr. Maurice March in reference to my culture from 07/28/17 and put her on doxycycline capsules which they're opening twice a day for 10 days. [name], [name] ([id number]) 09/17/17; patient arrives today with the wound bed looking fairly well granulated. There is undermining from 4 to 6:00 although this seems to of contracted slightly. She does not have obvious infection although the daughter states there was some darkening of the wound circumference that is not evident today. They state she is eating well. They are concerned about oral thrush 09/30/16; very fibrin looking granulated wound bed. Her undermining from 4 to 6:00 is about the same but also appears to be well granulated. She has rolled edges of senescent tissue from roughly 7 to 12:00. There is no evidence of infection 10/13/17 on evaluation today patient appears to be doing fairly well all things considered in regard to her sacral wound. There's really not a lot of significant change or improvement she does have some evidence of contusion and deep tissue injury around the left border of the wound that patient's daughter did inquire about today. Nonetheless overall the wound appears to be doing about the same in my opinion. There is no significant indication of infection there also is no significant slough noted at this point. 10/27/17 She is here in follow up evaluation of a sacral ulcer. She is accompanied by her daughter and caregiver. There is red granulation tissue  throughout, persistent discoloration to left border; this appears consistent with deep tissue injury. There are multiple areas covered in foam borders, tegaderm, etc that are "preventative" with "no wounds". We will continue with prisma and continue with two week follow ups 11/17/17 on evaluation today patient appears to be doing decently well in regard to her wound at this point. She continues to have a sacral wound ulcer.  We see her roughly every two weeks. In the last week she was actually in the hospital due to what was diagnosed as sepsis although no organisms were ever identified in the actual blood cultures. The hospital really was not sure that the wound was the cause of the infection but they really did not figure out anything else that would be a causative organism she did have a CT scan and that revealed no evidence of pneumonia there was also no evidence of urinary tract infection. Again it very well could have been the wound called in those although wound appears to be doing fairly well at this point. 12/15/17 on evaluation today patient actually appears to be doing about the same in regard to her sacral ulcer. The one thing different is that she does seem to have a rash where her right arm is contracted and being held to the thorax. The area underlying both on the ventral side of the arm as well is the thorax where it comes in contact shows evidence of a rash which appears to be fungal in nature. There does not appear to be any evidence of infection lies at this point. There does not appear to be a rash consistent with shingles which was also of concern initially. No fevers, chills, nausea, or vomiting noted at this time. 12/29/17 on evaluation today patient appears to be doing a little better in regard to her sacral wound although the one changes the 12 o'clock location of the wound seems to have attached at one point which no longer allows this to pull back. This has caused an area of  undermining that seems to be attaching as well in the 12 o'clock location. I do think that this is something that can be managed and is not necessarily a bad thing. Nonetheless she does seem to have some pain with exploration of this region of undermining. She continues to have an area under her right arm on the chest wall of erythema although this is a little better especially after the home health nurse is actually got in order for Diflucan times one for the patient. She is a new area on the left wrist that looks like because of the contraction her nail may have pushed in on her wrist area causing a slight cut which subsequently became infected. She has some honey crusted drainage noted. Patient History Unable to Obtain Patient History due to Altered Mental Status. Information obtained from Patient. Social History Never smoker, Marital Status - Married, Alcohol Use - Never, Drug Use - No History, Caffeine Use - Never. Medical And Surgical History Notes Ear/Nose/Mouth/Throat dysphagia Neurologic parkinsons, tremor Review of Systems (ROS) Constitutional Symptoms (General Health) Denies complaints or symptoms of Fever, Chills. [name], [name] ([id number]) Respiratory The patient has no complaints or symptoms. Cardiovascular The patient has no complaints or symptoms. Psychiatric The patient has no complaints or symptoms. Objective Constitutional Chronically ill appearing but in no apparent acute distress. Vitals Time Taken: 10:22 AM, Height: 60 in, Weight: 100 lbs, BMI: 19.5, Temperature: 98.1 F, Pulse: 56 bpm, Respiratory Rate: 14 breaths/min, Blood Pressure: 146/65 mmHg. Respiratory normal breathing without difficulty. clear to auscultation bilaterally. Cardiovascular regular rate and rhythm with normal S1, S2. Psychiatric Patient is not able to cooperate in decision making regarding care. Patient has dementia. patient is confused. General Notes: At this point patient's wound  again appears to be doing somewhat better which is good news. Obviously this is very slow process however she does have some  undermining no debridement was performed that we will continue to pack Prisma underneath the region of undermining in particular. Otherwise my hope is that she will continue to show signs of improvement week by week as we see and evaluate her. I'm gonna recommend that we continue the nystatin under the right arm/chest wall as I feel that this is helpful there still erythema and some work to be done in that regard. Lastly the patient's left wrist appears to be a slight impetigo there is no specific wound but I do believe she would benefit from Bactroban ointment. Integumentary (Hair, Skin) Wound #1 status is Open. Original cause of wound was Pressure Injury. The wound is located on the Midline Coccyx. The wound measures 3.7cm length x 3.3cm width x 0.4cm depth; 9.59cm^2 area and 3.836cm^3 volume. There is muscle and Fat Layer (Subcutaneous Tissue) Exposed exposed. Tunneling has been noted at 12:00 with a maximum distance of 1.2cm. Undermining begins at 5:00 and ends at 8:00 with a maximum distance of 1.2cm. There is a large amount of serosanguineous drainage noted. Foul odor after cleansing was noted. The wound margin is flat and intact. There is medium (34-66%) red granulation within the wound bed. There is a medium (34-66%) amount of necrotic tissue within the wound bed including Adherent Slough. The periwound skin appearance exhibited: Induration, Erythema. The periwound skin appearance did not exhibit: Callus, Crepitus, Excoriation, Rash, Scarring, Dry/Scaly, Maceration, Atrophie Blanche, Cyanosis, Ecchymosis, Hemosiderin Staining, Mottled, Pallor, Rubor. The surrounding wound skin color is noted with erythema which is circumferential. Periwound temperature was noted as No Abnormality. The periwound has tenderness on palpation. Wound #5 status is Open. Original cause of  wound was Pressure Injury. The wound is located on the Left Lower Leg. The wound measures 1.8cm length x 1.1cm width x 0.1cm depth; 1.555cm^2 area and 0.156cm^3 volume. There is no tunneling or undermining noted. There is a medium amount of serous drainage noted. The wound margin is distinct with the outline attached to the wound base. There is large (67-100%) pink granulation within the wound bed. There is no necrotic tissue within the wound bed. The periwound skin appearance did not exhibit: Callus, Crepitus, Excoriation, Induration, Rash, Scarring, Dry/Scaly, Maceration, Atrophie Blanche, Cyanosis, Ecchymosis, Hemosiderin Staining, Mottled, Pallor, Rubor, [name], [name]. ([id number]) Erythema. Assessment Active Problems ICD-10 L89.153 - Pressure ulcer of sacral region, stage 3 G20 - Parkinson's disease L03.312 - Cellulitis of back [any part except buttock] S81.811D - Laceration without foreign body, right lower leg, subsequent encounter Plan Wound Cleansing: Wound #1 Midline Coccyx: Clean wound with Normal Saline. May Shower, gently pat wound dry prior to applying new dressing. Wound #5 Left Lower Leg: Clean wound with Normal Saline. May Shower, gently pat wound dry prior to applying new dressing. Anesthetic (add to Medication List): Wound #1 Midline Coccyx: Topical Lidocaine 4% cream applied to wound bed prior to debridement (In Clinic Only). Skin Barriers/Peri-Wound Care: Wound #1 Midline Coccyx: Skin Prep - use only where the allevyn sticks not on reddened areas Antifungal powder-Nystatin - around peri-wound on coccyx (do not get in wound), under right breast and right arm, right abdomen Other: - bactroban ointment to left arm Primary Wound Dressing: Wound #1 Midline Coccyx: Silver Collagen - prisma ag and pack into undermining as well slightly moisten with saline Wound #5 Left Lower Leg: Xeroform Secondary Dressing: Wound #1 Midline Coccyx: Dry Gauze Boardered Foam  Dressing - HHRN PLEASE ORDER ALLEVYN LIFE BORDERED FOAM FOR PATIENT (OFF BRANDS IRRITATE HER SKIN) Wound #5  Left Lower Leg: Other - coban, tape, and stretch net Dressing Change Frequency: Wound #1 Midline Coccyx: Change dressing every day. Wound #5 Left Lower Leg: Change dressing every other day. Follow-up Appointments: Return Appointment in 2 weeks. Off-LoadingALISABETH, [name] ([id number]) Wound #1 Midline Coccyx: Roho cushion for wheelchair Mattress - fluidized air mattress Turn and reposition every 2 hours Additional Orders / Instructions: Wound #1 Midline Coccyx: Increase protein intake. - please add protein supplements to patients diet Other: - please add vitamin A, vitamin C and zinc supplements to patients diet Home Health: Wound #1 Midline Coccyx: Continue Home Health Visits - [facility] Nurse may visit PRN to address patient s wound care needs. FACE TO FACE ENCOUNTER: MEDICARE and MEDICAID PATIENTS: I certify that this patient is under my care and that I had a face-to-face encounter that meets the physician face-to-face encounter requirements with this patient on this date. The encounter with the patient was in whole or in part for the following MEDICAL CONDITION: (primary reason for Home Healthcare) MEDICAL NECESSITY: I certify, that based on my findings, NURSING services are a medically necessary home health service. HOME BOUND STATUS: I certify that my clinical findings support that this patient is homebound (i.e., Due to illness or injury, pt requires aid of supportive devices such as crutches, cane, wheelchairs, walkers, the use of special transportation or the assistance of another person to leave their place of residence. There is a normal inability to leave the home and doing so requires considerable and taxing effort. Other absences are for medical reasons / religious services and are infrequent or of short duration when for other reasons). If current  dressing causes regression in wound condition, may D/C ordered dressing product/s and apply Normal Saline Moist Dressing daily until next Wound Healing Center / Other MD appointment. Notify Wound Healing Center of regression in wound condition at [phone]. Please direct any NON-WOUND related issues/requests for orders to patient's Primary Care Physician - Dr Vonita Moss The following medication(s) was prescribed: lidocaine topical 4 % cream 1 1 cream topical was prescribed at facility mupirocin topical 2 % ointment ointment topical applied to the wound bed in a thin film 2 times a day until healed starting 12/29/2017 nystatin topical 100,000 unit/gram powder powder topical applied to the sacral peri-wound and the right chest wall daily. starting 12/29/2017 At this point I'm gonna go ahead and recommend a prescription for Bactroban ointment for the patient her daughter is in agreement with plan. We will subsequently continue with the other wound care orders for the time being said she seems to be doing well and I think this is appropriate. They are in agreement with this plan also. We will see were things stand in 2 weeks time when we see her for reevaluation. Please see above for specific wound care orders. We will see patient for re-evaluation in 2 week(s) here in the clinic. If anything worsens or changes patient will contact our office for additional recommendations. Electronic Signature(s) Signed: 12/29/2017 5:42:34 PM By: Lenda Kelp PA-C Entered By: Lenda Kelp on 12/29/2017 15:36:47 [name] ([id number]) -------------------------------------------------------------------------------- ROS/PFSH Details Patient Name: [name] Date of Service: 12/29/2017 10:15 AM Medical Record Number: [id number] Patient Account Number: [id number] Date of Birth/Sex: October 14, 1922 (84 y.o. F) Treating RN: Phillis Haggis Primary Care Provider: Vonita Moss Other Clinician: Referring  Provider: Vonita Moss Treating Provider/Extender: Linwood Dibbles, HOYT Weeks in Treatment: 50 Unable to Obtain Patient History due to oo Altered Mental Status  Information Obtained From Patient Wound History Do you currently have one or more open woundso Yes How many open wounds do you currently haveo 1 Approximately how long have you had your woundso 1 week How have you been treating your wound(s) until nowo santyl and gauze Has your wound(s) ever healed and then re-openedo No Have you had any lab work done in the past montho No Have you tested positive for an antibiotic resistant organism (MRSA, VRE)o No Have you tested positive for osteomyelitis (bone infection)o No Have you had any tests for circulation on your legso No Constitutional Symptoms (General Health) Complaints and Symptoms: Negative for: Fever; Chills Eyes Medical History: Negative for: Cataracts; Glaucoma; Optic Neuritis Ear/Nose/Mouth/Throat Medical History: Negative for: Chronic sinus problems/congestion; Middle ear problems Past Medical History Notes: dysphagia Hematologic/Lymphatic Medical History: Positive for: Anemia Negative for: Hemophilia; Human Immunodeficiency Virus; Lymphedema; Sickle Cell Disease Respiratory Complaints and Symptoms: No Complaints or Symptoms Medical History: Negative for: Aspiration; Asthma; Chronic Obstructive Pulmonary Disease (COPD); Pneumothorax; Sleep Apnea; Tuberculosis Cardiovascular EQUILLA, QUE. ([id number]) Complaints and Symptoms: No Complaints or Symptoms Medical History: Positive for: Hypertension Negative for: Angina; Arrhythmia; Congestive Heart Failure; Coronary Artery Disease; Deep Vein Thrombosis; Hypotension; Myocardial Infarction; Peripheral Arterial Disease; Peripheral Venous Disease; Phlebitis; Vasculitis Gastrointestinal Medical History: Negative for: Cirrhosis ; Colitis; Crohnos; Hepatitis A; Hepatitis B; Hepatitis C Endocrine Medical  History: Negative for: Type I Diabetes; Type II Diabetes Genitourinary Medical History: Negative for: End Stage Renal Disease Immunological Medical History: Negative for: Lupus Erythematosus; Raynaudos; Scleroderma Integumentary (Skin) Medical History: Negative for: History of Burn; History of pressure wounds Musculoskeletal Medical History: Negative for: Gout; Rheumatoid Arthritis; Osteoarthritis; Osteomyelitis Neurologic Medical History: Positive for: Dementia Negative for: Neuropathy; Quadriplegia; Paraplegia; Seizure Disorder Past Medical History Notes: parkinsons, tremor Oncologic Medical History: Negative for: Received Chemotherapy; Received Radiation Psychiatric Complaints and Symptoms: No Complaints or Symptoms Medical History: Negative for: Anorexia/bulimia; Confinement Anxiety [name], [name] ([id number]) Immunizations Pneumococcal Vaccine: Received Pneumococcal Vaccination: Yes Immunization Notes: up to date Implantable Devices Family and Social History Never smoker; Marital Status - Married; Alcohol Use: Never; Drug Use: No History; Caffeine Use: Never; Financial Concerns: No; Food, Clothing or Shelter Needs: No; Support System Lacking: No; Transportation Concerns: No; Advanced Directives: Yes (Not Provided); Patient does not want information on Advanced Directives; Medical Power of Attorney: Yes - dtr (Not Provided) Physician Affirmation I have reviewed and agree with the above information. Electronic Signature(s) Signed: 12/29/2017 5:42:34 PM By: Lenda Kelp PA-C Signed: 12/31/2017 4:32:30 PM By: Alejandro Mulling Entered By: Lenda Kelp on 12/29/2017 11:45:35 [name] ([id number]) -------------------------------------------------------------------------------- SuperBill Details Patient Name: [name] Date of Service: 12/29/2017 Medical Record Number: [id number] Patient Account Number: [id number] Date of Birth/Sex: 02-18-1923 (84 y.o.  F) Treating RN: Phillis Haggis Primary Care Provider: Vonita Moss Other Clinician: Referring Provider: Vonita Moss Treating Provider/Extender: Linwood Dibbles, HOYT Weeks in Treatment: 50 Diagnosis Coding ICD-10 Codes Code Description L89.153 Pressure ulcer of sacral region, stage 3 G20 Parkinson's disease L03.312 Cellulitis of back [any part except buttock] S81.811D Laceration without foreign body, right lower leg, subsequent encounter Facility Procedures CPT4 Code: 62952841 Description: 99214 - WOUND CARE VISIT-LEV 4 EST PT Modifier: Quantity: 1 Physician Procedures CPT4 Code: 3244010 Description: 99213 - WC PHYS LEVEL 3 - EST PT ICD-10 Diagnosis Description L89.153 Pressure ulcer of sacral region, stage 3 G20 Parkinson's disease L03.312 Cellulitis of back [any part except buttock] S81.811D Laceration without foreign body, right lower  leg, subseque Modifier: nt encounter Quantity: 1  Electronic Signature(s) Signed: 12/29/2017 12:57:24 PM By: Alejandro Mulling Signed: 12/29/2017 5:42:34 PM By: Lenda Kelp PA-C Entered By: Alejandro Mulling on 12/29/2017 12:57:24

## 2018-01-05 ENCOUNTER — Encounter: Payer: Medicare Other | Admitting: Physician Assistant

## 2018-01-06 ENCOUNTER — Ambulatory Visit: Payer: Medicare Other | Admitting: Family Medicine

## 2018-01-12 ENCOUNTER — Encounter: Payer: Medicare Other | Admitting: Physician Assistant

## 2018-01-12 DIAGNOSIS — L89153 Pressure ulcer of sacral region, stage 3: Secondary | ICD-10-CM | POA: Diagnosis not present

## 2018-01-18 NOTE — Progress Notes (Signed)
Tina, [name] ([id number]) Visit Report for 01/12/2018 Arrival Information Details Patient Name: [name], [name] Date of Service: 01/12/2018 10:15 AM Medical Record Number: [id number] Patient Account Number: [id number] Date of Birth/Sex: 01/24/1923 (84 y.o. F) Treating RN: Curtis Sites Primary Care [name]: [name] Other Clinician: Referring [name]: [name] Treating [name]/Extender: [name], [name] in Treatment: 52 Visit Information History Since Last Visit Added or deleted any medications: No Patient Arrived: Wheel Chair Any new allergies or adverse reactions: No Arrival Time: 10:30 Had a fall or experienced change in No activities of daily living that may affect Accompanied By: cg risk of falls: Transfer Assistance: Manual Signs or symptoms of abuse/neglect since No Patient Identification Verified: Yes last visito Secondary Verification Process Completed: Yes Hospitalized since last visit: No Patient Requires Transmission-Based No Implantable device outside of the clinic No Precautions: excluding Patient Has Alerts: No cellular tissue based products placed in the center since last visit: Has Dressing in Place as Prescribed: Yes Pain Present Now: Unable to Respond Electronic Signature(s) Signed: 01/12/2018 3:21:08 PM By: Alejandro Mulling Entered By: Alejandro Mulling on 01/12/2018 11:17:28 [name] ([id number]) -------------------------------------------------------------------------------- Clinic Level of Care Assessment Details Patient Name: [name] Date of Service: 01/12/2018 10:15 AM Medical Record Number: [id number] Patient Account Number: [id number] Date of Birth/Sex: 06-26-1923 (84 y.o. F) Treating RN: Phillis Haggis Primary Care Burk Hoctor: [name] Other Clinician: Referring Priscella Donna: [name] Treating Cystal Shannahan/Extender: [name], [name] in Treatment: 52 Clinic Level of Care Assessment Items TOOL 4  Quantity Score X - Use when only an EandM is performed on FOLLOW-UP visit 1 0 ASSESSMENTS - Nursing Assessment / Reassessment X - Reassessment of Co-morbidities (includes updates in patient status) 1 10 X- 1 5 Reassessment of Adherence to Treatment Plan ASSESSMENTS - Wound and Skin Assessment / Reassessment []  - Simple Wound Assessment / Reassessment - one wound 0 X- 3 5 Complex Wound Assessment / Reassessment - multiple wounds []  - 0 Dermatologic / Skin Assessment (not related to wound area) ASSESSMENTS - Focused Assessment []  - Circumferential Edema Measurements - multi extremities 0 []  - 0 Nutritional Assessment / Counseling / Intervention []  - 0 Lower Extremity Assessment (monofilament, tuning fork, pulses) []  - 0 Peripheral Arterial Disease Assessment (using hand held doppler) ASSESSMENTS - Ostomy and/or Continence Assessment and Care []  - Incontinence Assessment and Management 0 []  - 0 Ostomy Care Assessment and Management (repouching, etc.) PROCESS - Coordination of Care []  - Simple Patient / Family Education for ongoing care 0 X- 1 20 Complex (extensive) Patient / Family Education for ongoing care X- 1 10 Staff obtains Chiropractor, Records, Test Results / Process Orders X- 1 10 Staff telephones HHA, Nursing Homes / Clarify orders / etc []  - 0 Routine Transfer to another Facility (non-emergent condition) []  - 0 Routine Hospital Admission (non-emergent condition) []  - 0 New Admissions / Manufacturing engineer / Ordering NPWT, Apligraf, etc. []  - 0 Emergency Hospital Admission (emergent condition) X- 1 10 Simple Discharge Coordination Tina, [name]. ([id number]) []  - 0 Complex (extensive) Discharge Coordination PROCESS - Special Needs []  - Pediatric / Minor Patient Management 0 []  - 0 Isolation Patient Management []  - 0 Hearing / Language / Visual special needs []  - 0 Assessment of Community assistance (transportation, D/C planning, etc.) []  - 0 Additional  assistance / Altered mentation []  - 0 Support Surface(s) Assessment (bed, cushion, seat, etc.) INTERVENTIONS - Wound Cleansing / Measurement []  - Simple Wound Cleansing - one wound 0 X- 3 5 Complex  Wound Cleansing - multiple wounds X- 1 5 Wound Imaging (photographs - any number of wounds) []  - 0 Wound Tracing (instead of photographs) []  - 0 Simple Wound Measurement - one wound X- 3 5 Complex Wound Measurement - multiple wounds INTERVENTIONS - Wound Dressings X - Small Wound Dressing one or multiple wounds 3 10 []  - 0 Medium Wound Dressing one or multiple wounds []  - 0 Large Wound Dressing one or multiple wounds X- 1 5 Application of Medications - topical []  - 0 Application of Medications - injection INTERVENTIONS - Miscellaneous []  - External ear exam 0 []  - 0 Specimen Collection (cultures, biopsies, blood, body fluids, etc.) []  - 0 Specimen(s) / Culture(s) sent or taken to Lab for analysis []  - 0 Patient Transfer (multiple staff / Nurse, adultHoyer Lift / Similar devices) []  - 0 Simple Staple / Suture removal (25 or less) []  - 0 Complex Staple / Suture removal (26 or more) []  - 0 Hypo / Hyperglycemic Management (close monitor of Blood Glucose) []  - 0 Ankle / Brachial Index (ABI) - do not check if billed separately X- 1 5 Vital Signs Festa, [name] ([id number]) Has the patient been seen at the hospital within the last three years: Yes Total Score: 155 Level Of Care: New/Established - Level 4 Electronic Signature(s) Signed: 01/12/2018 3:21:08 PM By: Alejandro MullingPinkerton, Debra Entered By: Alejandro MullingPinkerton, Debra on 01/12/2018 15:14:40 Tina MainlandSHAW, [name] ([id number]) -------------------------------------------------------------------------------- Encounter Discharge Information Details Patient Name: Tina MainlandSHAW, [name] Date of Service: 01/12/2018 10:15 AM Medical Record Number: [id number] Patient Account Number: [id number] Date of Birth/Sex: 1923/01/18 (84 y.o. F) Treating RN: Renne CriglerFlinchum,  Cheryl Primary Care Murlene Revell: Tina MossRISSMAN, MARK Other Clinician: Referring Lonzo Saulter: Tina MossRISSMAN, MARK Treating Kamariya Blevens/Extender: Tina DibblesSTONE III, [name] in Treatment: 3352 Encounter Discharge Information Items Discharge Pain Level: 0 Discharge Condition: Stable Ambulatory Status: Wheelchair Discharge Destination: Home Transportation: Private Auto Schedule Follow-up Appointment: No Medication Reconciliation completed and No provided to Patient/Care Solace Manwarren: Provided on Clinical Summary of Care: 01/12/2018 Form Type Recipient Paper Patient ES Electronic Signature(s) Signed: 01/12/2018 3:03:21 PM By: Gwenlyn PerkingMoore, Shelia Entered By: Gwenlyn PerkingMoore, Shelia on 01/12/2018 11:51:47 Tina MainlandSHAW, Aleynah W. ([id number]) -------------------------------------------------------------------------------- Lower Extremity Assessment Details Patient Name: Tina MainlandSHAW, Ellamay W. Date of Service: 01/12/2018 10:15 AM Medical Record Number: [id number] Patient Account Number: [id number] Date of Birth/Sex: 1923/01/18 (84 y.o. F) Treating RN: Curtis Sitesorthy, Joanna Primary Care Kelin Nixon: Tina MossRISSMAN, MARK Other Clinician: Referring Armie Moren: Tina MossRISSMAN, MARK Treating Luvenia Cranford/Extender: Tina DibblesSTONE III, [name] in Treatment: 4252 Vascular Assessment Pulses: Dorsalis Pedis Palpable: [Left:Yes] [Right:Yes] Posterior Tibial Extremity colors, hair growth, and conditions: Hair Growth on Extremity: [Left:No] [Right:No] Temperature of Extremity: [Left:Warm] [Right:Warm] Capillary Refill: [Left:< 3 seconds] [Right:< 3 seconds] Electronic Signature(s) Signed: 01/12/2018 3:14:51 PM By: Curtis Sitesorthy, Joanna Entered By: Curtis Sitesorthy, Joanna on 01/12/2018 10:52:56 Tina MainlandSHAW, Eugina W. ([id number]) -------------------------------------------------------------------------------- Multi Wound Chart Details Patient Name: Tina MainlandSHAW, Solene W. Date of Service: 01/12/2018 10:15 AM Medical Record Number: [id number] Patient Account Number: [id number] Date of Birth/Sex: 1923/01/18 (84 y.o.  F) Treating RN: Phillis HaggisPinkerton, Debi Primary Care Rupert Azzara: Tina MossRISSMAN, MARK Other Clinician: Referring Renell Coaxum: Tina MossRISSMAN, MARK Treating Jania Steinke/Extender: Tina DibblesSTONE III, [name] in Treatment: 52 Vital Signs Height(in): 60 Pulse(bpm): 57 Weight(lbs): 100 Blood Pressure(mmHg): 155/64 Body Mass Index(BMI): 20 Temperature(F): 98.3 Respiratory Rate 12 (breaths/min): Photos: Wound Location: Coccyx - Midline Left Lower Leg Right Lower Leg Wounding Event: Pressure Injury Pressure Injury Not Known Primary Etiology: Pressure Ulcer Pressure Ulcer Skin Tear Comorbid History: Anemia, Hypertension, Anemia, Hypertension, Anemia, Hypertension, Dementia Dementia Dementia Date Acquired: 12/01/2016 12/09/2017 01/12/2018 [name] of Treatment: 52 4 0  Wound Status: Open Open Open Measurements L x W x D 4.7x2.8x0.4 0.5x0.5x0.1 0.6x0.6x0.1 (cm) Area (cm) : 10.336 0.196 0.283 Volume (cm) : 4.134 0.02 0.028 % Reduction in Area: -148.80% 88.90% N/A % Reduction in Volume: -148.70% 88.70% N/A Starting Position 1 5 (o'clock): Ending Position 1 7 (o'clock): Maximum Distance 1 (cm): 0.6 Undermining: Yes No No Classification: Category/Stage IV Unstageable/Unclassified Partial Thickness Exudate Amount: Large Medium Medium Exudate Type: Serosanguineous Serous Serous Exudate Color: red, brown amber amber Foul Odor After Cleansing: Yes No No Odor Anticipated Due to No N/A N/A Product Use: Wound Margin: Flat and Intact Distinct, outline attached Flat and Intact Granulation Amount: Large (67-100%) Large (67-100%) Large (67-100%) LIYLA, RADLIFF. ([id number]) Granulation Quality: Red Pink Red Necrotic Amount: Small (1-33%) None Present (0%) None Present (0%) Exposed Structures: Fat Layer (Subcutaneous Fascia: No Fascia: No Tissue) Exposed: Yes Fat Layer (Subcutaneous Fat Layer (Subcutaneous Muscle: Yes Tissue) Exposed: No Tissue) Exposed: No Fascia: No Tendon: No Tendon: No Tendon: No Muscle:  No Muscle: No Joint: No Joint: No Joint: No Bone: No Bone: No Bone: No Epithelialization: None Large (67-100%) Small (1-33%) Periwound Skin Texture: Induration: Yes Excoriation: No Excoriation: No Excoriation: No Induration: No Induration: No Callus: No Callus: No Callus: No Crepitus: No Crepitus: No Crepitus: No Rash: No Rash: No Rash: No Scarring: No Scarring: No Scarring: No Periwound Skin Moisture: Maceration: No Maceration: No Maceration: No Dry/Scaly: No Dry/Scaly: No Dry/Scaly: No Periwound Skin Color: Erythema: Yes Atrophie Blanche: No Atrophie Blanche: No Atrophie Blanche: No Cyanosis: No Cyanosis: No Cyanosis: No Ecchymosis: No Ecchymosis: No Ecchymosis: No Erythema: No Erythema: No Hemosiderin Staining: No Hemosiderin Staining: No Hemosiderin Staining: No Mottled: No Mottled: No Mottled: No Pallor: No Pallor: No Pallor: No Rubor: No Rubor: No Rubor: No Erythema Location: Circumferential N/A N/A Temperature: No Abnormality N/A N/A Tenderness on Palpation: Yes No No Wound Preparation: Ulcer Cleansing: Ulcer Cleansing: Ulcer Cleansing: Rinsed/Irrigated with Saline Rinsed/Irrigated with Saline Rinsed/Irrigated with Saline Topical Anesthetic Applied: Topical Anesthetic Applied: Topical Anesthetic Applied: Other: lidocaine 4% None None Treatment Notes Electronic Signature(s) Signed: 01/12/2018 3:21:08 PM By: Alejandro Mulling Entered By: Alejandro Mulling on 01/12/2018 11:17:55 [name] ([id number]) -------------------------------------------------------------------------------- Multi-Disciplinary Care Plan Details Patient Name: [name] Date of Service: 01/12/2018 10:15 AM Medical Record Number: [id number] Patient Account Number: [id number] Date of Birth/Sex: [birth year] (84 y.o. F) Treating RN: Phillis Haggis Primary Care [place] Ndiaye: [name] Other Clinician: Referring Shaquala Broeker: [name] Treating  Kanijah Groseclose/Extender: [name], [name] in Treatment: 43 Active Inactive ` Abuse / Safety / Falls / Self Care Management Nursing Diagnoses: Impaired physical mobility Potential for falls Goals: Patient will remain injury free Date Initiated: 01/07/2017 Target Resolution Date: 04/03/2017 Goal Status: Active Interventions: Assess fall risk on admission and as needed Notes: ` Nutrition Nursing Diagnoses: Potential for alteratiion in Nutrition/Potential for imbalanced nutrition Goals: Patient/caregiver agrees to and verbalizes understanding of need to use nutritional supplements and/or vitamins as prescribed Date Initiated: 01/07/2017 Target Resolution Date: 04/03/2017 Goal Status: Active Interventions: Assess patient nutrition upon admission and as needed per policy Notes: ` Orientation to the Wound Care Program Nursing Diagnoses: Knowledge deficit related to the wound healing center program Goals: Patient/caregiver will verbalize understanding of the Wound Healing Center Program Date Initiated: 01/07/2017 Target Resolution Date: 04/03/2017 Goal Status: Active REFUGIA, LANEVE ([id number]) Interventions: Provide education on orientation to the wound center Notes: ` Pressure Nursing Diagnoses: Knowledge deficit related to causes and risk factors for pressure ulcer development Goals: Patient will remain free  from development of additional pressure ulcers Date Initiated: 01/07/2017 Target Resolution Date: 04/03/2017 Goal Status: Active Interventions: Assess potential for pressure ulcer upon admission and as needed Notes: ` Wound/Skin Impairment Nursing Diagnoses: Impaired tissue integrity Goals: Patient/caregiver will verbalize understanding of skin care regimen Date Initiated: 01/07/2017 Target Resolution Date: 04/03/2017 Goal Status: Active Ulcer/skin breakdown will have a volume reduction of 30% by week 4 Date Initiated: 01/07/2017 Target Resolution Date: 04/03/2017 Goal  Status: Active Ulcer/skin breakdown will have a volume reduction of 50% by week 8 Date Initiated: 01/07/2017 Target Resolution Date: 04/03/2017 Goal Status: Active Ulcer/skin breakdown will have a volume reduction of 80% by week 12 Date Initiated: 01/07/2017 Target Resolution Date: 04/03/2017 Goal Status: Active Ulcer/skin breakdown will heal within 14 [name] Date Initiated: 01/07/2017 Target Resolution Date: 04/03/2017 Goal Status: Active Interventions: Assess patient/caregiver ability to obtain necessary supplies Assess patient/caregiver ability to perform ulcer/skin care regimen upon admission and as needed Assess ulceration(s) every visit Notes: Electronic Signature(s) MERTICE, UFFELMAN ([id number]) Signed: 01/12/2018 3:21:08 PM By: Alejandro Mulling Entered By: Alejandro Mulling on 01/12/2018 11:17:45 [name] ([id number]) -------------------------------------------------------------------------------- Pain Assessment Details Patient Name: [name] Date of Service: 01/12/2018 10:15 AM Medical Record Number: [id number] Patient Account Number: [id number] Date of Birth/Sex: 06/24/1923 (84 y.o. F) Treating RN: Curtis Sites Primary Care Nathanel Tallman: [name] Other Clinician: Referring Lawayne Hartig: [name] Treating Camdyn Beske/Extender: [name], [name] in Treatment: 40 Active Problems Location of Pain Severity and Description of Pain Patient Has Paino Patient Unable to Respond Site Locations Pain Management and Medication Current Pain Management: Electronic Signature(s) Signed: 01/12/2018 3:14:51 PM By: Curtis Sites Entered By: Curtis Sites on 01/12/2018 10:32:42 [name] ([id number]) -------------------------------------------------------------------------------- Patient/Caregiver Education Details Patient Name: [name] Date of Service: 01/12/2018 10:15 AM Medical Record Number: [id number] Patient Account Number: [id number] Date of  Birth/Gender: Jan 10, 1923 (84 y.o. F) Treating RN: Renne Crigler Primary Care Physician: [name] Other Clinician: Referring Physician: Vonita [name] Treating Physician/Extender: Skeet Simmer in Treatment: 60 Education Assessment Education Provided To: Patient Education Topics Provided Wound Debridement: Handouts: Wound Debridement Methods: Explain/Verbal Responses: State content correctly Wound/Skin Impairment: Handouts: Caring for Your Ulcer Methods: Explain/Verbal Responses: State content correctly Electronic Signature(s) Signed: 01/13/2018 4:34:44 PM By: Renne Crigler Entered By: Renne Crigler on 01/12/2018 11:50:46 [name] ([id number]) -------------------------------------------------------------------------------- Wound Assessment Details Patient Name: [name] Date of Service: 01/12/2018 10:15 AM Medical Record Number: [id number] Patient Account Number: [id number] Date of Birth/Sex: 08-Oct-1922 (84 y.o. F) Treating RN: Curtis Sites Primary Care Glorianna Gott: [name] Other Clinician: Referring Myka Lukins: [name] Treating Rodel Glaspy/Extender: STONE III, [name] in Treatment: 52 Wound Status Wound Number: 1 Primary Etiology: Pressure Ulcer Wound Location: Coccyx - Midline Wound Status: Open Wounding Event: Pressure Injury Comorbid History: Anemia, Hypertension, Dementia Date Acquired: 12/01/2016 [name] Of Treatment: 52 Clustered Wound: No Photos Photo Uploaded By: Curtis Sites on 01/12/2018 10:56:46 Wound Measurements Length: (cm) 4.7 Width: (cm) 2.8 Depth: (cm) 0.4 Area: (cm) 10.336 Volume: (cm) 4.134 % Reduction in Area: -148.8% % Reduction in Volume: -148.7% Epithelialization: None Tunneling: No Undermining: Yes Starting Position (o'clock): 5 Ending Position (o'clock): 7 Maximum Distance: (cm) 0.6 Wound Description Classification: Category/Stage IV Wound Margin: Flat and Intact Exudate Amount:  Large Exudate Type: Serosanguineous Exudate Color: red, brown Foul Odor After Cleansing: Yes Due to Product Use: No Slough/Fibrino Yes Wound Bed Granulation Amount: Large (67-100%) Exposed Structure Granulation Quality: Red Fascia Exposed: No Necrotic Amount: Small (1-33%) Fat Layer (Subcutaneous Tissue) Exposed: Yes Necrotic Quality:  Adherent Slough Tendon Exposed: No Muscle Exposed: Yes MELIKA, REDER. ([id number]) Necrosis of Muscle: No Joint Exposed: No Bone Exposed: No Periwound Skin Texture Texture Color No Abnormalities Noted: No No Abnormalities Noted: No Callus: No Atrophie Blanche: No Crepitus: No Cyanosis: No Excoriation: No Ecchymosis: No Induration: Yes Erythema: Yes Rash: No Erythema Location: Circumferential Scarring: No Hemosiderin Staining: No Mottled: No Moisture Pallor: No No Abnormalities Noted: No Rubor: No Dry / Scaly: No Maceration: No Temperature / Pain Temperature: No Abnormality Tenderness on Palpation: Yes Wound Preparation Ulcer Cleansing: Rinsed/Irrigated with Saline Topical Anesthetic Applied: Other: lidocaine 4%, Treatment Notes Wound #1 (Midline Coccyx) 1. Cleansed with: Clean wound with Normal Saline 2. Anesthetic Topical Lidocaine 4% cream to wound bed prior to debridement 3. Peri-wound Care: Skin Prep 4. Dressing Applied: Prisma Ag 5. Secondary Dressing Applied Bordered Foam Dressing Electronic Signature(s) Signed: 01/12/2018 3:14:51 PM By: Curtis Sites Entered By: Curtis Sites on 01/12/2018 10:45:34 [name] ([id number]) -------------------------------------------------------------------------------- Wound Assessment Details Patient Name: [name] Date of Service: 01/12/2018 10:15 AM Medical Record Number: [id number] Patient Account Number: [id number] Date of Birth/Sex: 03-18-1923 (84 y.o. F) Treating RN: Curtis Sites Primary Care Kaitland Lewellyn: [name] Other Clinician: Referring  Xylon Croom: [name] Treating Cidney Kirkwood/Extender: [name], [name] in Treatment: 52 Wound Status Wound Number: 5 Primary Etiology: Pressure Ulcer Wound Location: Left Lower Leg Wound Status: Open Wounding Event: Pressure Injury Comorbid History: Anemia, Hypertension, Dementia Date Acquired: 12/09/2017 [name] Of Treatment: 4 Clustered Wound: No Photos Photo Uploaded By: Curtis Sites on 01/12/2018 10:56:47 Wound Measurements Length: (cm) 0.5 Width: (cm) 0.5 Depth: (cm) 0.1 Area: (cm) 0.196 Volume: (cm) 0.02 % Reduction in Area: 88.9% % Reduction in Volume: 88.7% Epithelialization: Large (67-100%) Tunneling: No Undermining: No Wound Description Classification: Unstageable/Unclassified Wound Margin: Distinct, outline attached Exudate Amount: Medium Exudate Type: Serous Exudate Color: amber Foul Odor After Cleansing: No Slough/Fibrino Yes Wound Bed Granulation Amount: Large (67-100%) Exposed Structure Granulation Quality: Pink Fascia Exposed: No Necrotic Amount: None Present (0%) Fat Layer (Subcutaneous Tissue) Exposed: No Tendon Exposed: No Muscle Exposed: No Joint Exposed: No Bone Exposed: No Periwound Skin Texture KAILI, CASTILLE. ([id number]) Texture Color No Abnormalities Noted: No No Abnormalities Noted: No Callus: No Atrophie Blanche: No Crepitus: No Cyanosis: No Excoriation: No Ecchymosis: No Induration: No Erythema: No Rash: No Hemosiderin Staining: No Scarring: No Mottled: No Pallor: No Moisture Rubor: No No Abnormalities Noted: No Dry / Scaly: No Maceration: No Wound Preparation Ulcer Cleansing: Rinsed/Irrigated with Saline Topical Anesthetic Applied: None Treatment Notes Wound #5 (Left Lower Leg) 1. Cleansed with: Clean wound with Normal Saline 2. Anesthetic Topical Lidocaine 4% cream to wound bed prior to debridement 4. Dressing Applied: Xeroform 5. Secondary Dressing Applied Non-Adherent pad Notes conform and  stretch net Electronic Signature(s) Signed: 01/12/2018 3:14:51 PM By: Curtis Sites Entered By: Curtis Sites on 01/12/2018 10:48:19 [name] ([id number]) -------------------------------------------------------------------------------- Wound Assessment Details Patient Name: [name] Date of Service: 01/12/2018 10:15 AM Medical Record Number: [id number] Patient Account Number: [id number] Date of Birth/Sex: 02-11-1923 (84 y.o. F) Treating RN: Curtis Sites Primary Care Tamaiya Bump: [name] Other Clinician: Referring Amir Fick: [name] Treating Lahoma Constantin/Extender: STONE III, [name] in Treatment: 52 Wound Status Wound Number: 6 Primary Etiology: Skin Tear Wound Location: Right Lower Leg Wound Status: Open Wounding Event: Not Known Comorbid History: Anemia, Hypertension, Dementia Date Acquired: 01/12/2018 [name] Of Treatment: 0 Clustered Wound: No Photos Photo Uploaded By: Curtis Sites on 01/12/2018 10:57:00 Wound Measurements Length: (cm) 0.6 Width: (cm) 0.6  Depth: (cm) 0.1 Area: (cm) 0.283 Volume: (cm) 0.028 % Reduction in Area: % Reduction in Volume: Epithelialization: Small (1-33%) Tunneling: No Undermining: No Wound Description Classification: Partial Thickness Wound Margin: Flat and Intact Exudate Amount: Medium Exudate Type: Serous Exudate Color: amber Foul Odor After Cleansing: No Slough/Fibrino No Wound Bed Granulation Amount: Large (67-100%) Exposed Structure Granulation Quality: Red Fascia Exposed: No Necrotic Amount: None Present (0%) Fat Layer (Subcutaneous Tissue) Exposed: No Tendon Exposed: No Muscle Exposed: No Joint Exposed: No Bone Exposed: No Periwound Skin Texture AUNESTY, TYSON. ([id number]) Texture Color No Abnormalities Noted: No No Abnormalities Noted: No Callus: No Atrophie Blanche: No Crepitus: No Cyanosis: No Excoriation: No Ecchymosis: No Induration: No Erythema: No Rash: No Hemosiderin  Staining: No Scarring: No Mottled: No Pallor: No Moisture Rubor: No No Abnormalities Noted: No Dry / Scaly: No Maceration: No Wound Preparation Ulcer Cleansing: Rinsed/Irrigated with Saline Topical Anesthetic Applied: None Treatment Notes Wound #6 (Right Lower Leg) 1. Cleansed with: Clean wound with Normal Saline 2. Anesthetic Topical Lidocaine 4% cream to wound bed prior to debridement 4. Dressing Applied: Xeroform 5. Secondary Dressing Applied Non-Adherent pad Notes conform and stretch net Electronic Signature(s) Signed: 01/12/2018 3:14:51 PM By: Curtis Sites Entered By: Curtis Sites on 01/12/2018 10:50:31 [name] ([id number]) -------------------------------------------------------------------------------- Vitals Details Patient Name: [name] Date of Service: 01/12/2018 10:15 AM Medical Record Number: [id number] Patient Account Number: [id number] Date of Birth/Sex: [birth year] (84 y.o. F) Treating RN: Curtis Sites Primary Care Ariannah Arenson: [name] Other Clinician: Referring Traniyah Hallett: [name] Treating Annah Jasko/Extender: [name], [name] in Treatment: 54 Vital Signs Time Taken: 10:33 Temperature (F): 98.3 Height (in): 60 Pulse (bpm): 57 Weight (lbs): 100 Respiratory Rate (breaths/min): 12 Body Mass Index (BMI): 19.5 Blood Pressure (mmHg): 155/64 Reference Range: 80 - 120 mg / dl Electronic Signature(s) Signed: 01/12/2018 3:14:51 PM By: Curtis Sites Entered By: Curtis Sites on 01/12/2018 10:33:27

## 2018-01-19 ENCOUNTER — Encounter: Payer: Medicare Other | Admitting: Physician Assistant

## 2018-01-19 NOTE — Progress Notes (Signed)
FREIDA, NEBEL ([id number]) Visit Report for 01/12/2018 Chief Complaint Document Details Patient Name: [name], [name] Date of Service: 01/12/2018 10:15 AM Medical Record Number: [id number] Patient Account Number: [id number] Date of Birth/Sex: 02-28-1923 (84 y.o. F) Treating RN: Phillis Haggis Primary Care Provider: Vonita Moss Other Clinician: Referring Provider: Vonita Moss Treating Provider/Extender: Linwood Dibbles, Hajra Port Weeks in Treatment: 102 Information Obtained from: Patient Chief Complaint patient is here for follow up evaluation of a sacral pressure ulcer Electronic Signature(s) Signed: 01/13/2018 12:00:18 AM By: Lenda Kelp PA-C Entered By: Lenda Kelp on 01/12/2018 23:02:42 [name] ([id number]) -------------------------------------------------------------------------------- HPI Details Patient Name: [name] Date of Service: 01/12/2018 10:15 AM Medical Record Number: [id number] Patient Account Number: [id number] Date of Birth/Sex: [birth year] (84 y.o. F) Treating RN: Phillis Haggis Primary Care Provider: Vonita Moss Other Clinician: Referring Provider: Vonita Moss Treating Provider/Extender: Linwood Dibbles, Chizara Mena Weeks in Treatment: 70 History of Present Illness HPI Description: 01/07/17 this is a 84 year old woman admitted to the clinic today for review of a pressure ulcer on her lower sacrum. She is referred from her primary physician's office after being seen on 3/22 with a 3 cm pressure area. Her daughter and caretaker accompanied her today state that the area first became obvious about a month ago and his since deteriorated. They have recently got [name] home health involved and have been using [name] to the wound. They have ordered a pressure relief surface for her mattress. They are turning her religiously. They state that she eats well and they've been forcing fluids on her. She is on a multivitamin. Looking through [facility] point  last albumin I see was 4.4 on 10/28. The patient has advanced parkinsonism which looks superficially like advanced Parkinson's disease although her daughter tells me she did not ever respond to Sinemet therefore this may have another pathology with signs of parkinsonism. However I think this is largely a mute point currently. She also has dementia and is nonambulatory. Since this started they have been keeping her in bed and turning her religiously every 2 hours. She lives at home in [place] with her husband with 24/7 care giving 01/13/17 santyl change qd. Still will require further debridement. continue santyl. 01/20/17; patient's wound actually looks some better less adherent necrotic surface. There is actually visible granulation. We're using [name] 01/27/17; better-looking surface but still a lot of necrotic tissue on the base of this wound. The periwound erythema is better than last week we are still using [name]. Her daughter tells [place] that she is still having trouble with the pressure-relief mattress through medical modalities 02/03/18; I had the patient scheduled for a two week followup however her daughter brought her in early concerned for discoloration on 2 areas of the wound circumference. We have bee using santyl 02/10/17;Better looking surface to the wound. Rim appears better suggesting better offloading. Using santyl 02/24/17; change to Silver Collegen last time. Wound appears better. 03/10/17; still using silver collagen religious offloading. Intake is satisfactory per her daughter. Dimension slightly better 03/12/2017 -- Dr. Jannetta Quint patient who had been seen 2 days ago and was doing fairly well. The patient is brought in by her daughter who noticed a new wound just above the previous wound on her sacral area and going on more to the left lateral side. She was very concerned and we asked her to get in for an opinion. 03/17/17; above is noted. The patient has developed a progressive area  to the left of her original wound. This  seems to this started with a ring of red skin with a more pale interior almost looking fungal. There was a rim of blister through part of the area although this did not look like zoster. They have been applying triamcinolone that was prescribed last week by Dr. Meyer Russel and the area has a fold to a linear band area which is confluent, erythematous and with obvious epidermal swelling but there is no overt tenderness or crepitus. She has lost some surface epithelium closer to the wound surface itself and now has a more superficial wound in this area and the extending erythema goes towards the left buttock. This is well demarcated between involved in normal skin but once again does not appear to be at all tender. If there is a contact issue here I cannot get the history out of the daughter or the caregiver that are with her. 03/24/17; the patient arrives today with the wound slightly worse slightly more drainage. The bandlike area of erythema that I treated as a possible pineal infection has improved somewhat although proximally is still has confluent erythema without overt tenderness. We have been using silver alginate since the most recent deterioration. To the bandlike degree of erythema we have been using Lotrisone cream 03/31/17; patient arrives today with the wound slightly larger, necrotic surface and surrounding erythema. The bandlike area of erythema that I treated as a possible pineal infection is less swollen but still present I've been using Lotrisone cream on that largely related to the presence of a tinea looking infection when this was first seen. We've been using silver alginate. X-ray that I ordered last week showed no acute bony abnormalities mild fecal impaction. Lab work showed a comprehensive metabolic panel that was normal including an albumin of 3.8. White count was 9.5 hemoglobin 12.3 differential count normal. C-reactive protein was less than  1 and sedimentation rate and 17. The latter 2 values does not support an ongoing bacterial infection. 04/14/17; patient arrives after a 2 week hiatus. Her wound is not in good condition. Although the base of the wound looks [name], [name]. ([id number]) stable she still has an erythematous area that was apparently blistered over the weekend. This again points to the left. As our intake nurse pointed out today this is in the area where the tissue folds together and we may need to prevent try to prevent this. Lab work and x-ray that I did to 3 weeks ago were unremarkable including her albumin nevertheless she is an extremely frail condition physically. We have have been using [name] 04/22/17; I changed her to silver alginate because of the surrounding maceration and moisture last week. The daughter did not like the way the wound looked in the middle of the week and changed her back to [place]. They're putting gauze on top of this. Thinks still using Lotrisone. 05/06/17 on evaluation today patient sacral wound appears to be doing okay and does not seem to be any worse. She is having no significant pain during evaluation today the secondary to mental status she is unable to rate or describe whether she had any pain she was not however flinching. Her daughter states that the wound does appear to be looking better to her. Still we are having difficulty with the skinfold that seems to be closing in on itself at this point. All in all I feel like she is making some good progress in the [name] seems to be the official for her. They do tell me that a refill if  we are gonna continue that today. No fevers, chills, nausea, or vomiting noted at this time. 05/13/17 presents today for evaluation concerning her ongoing sacral pressure ulcer. Unfortunately she also has an area of deep tissue injury in the right Ischial region which is starting to show up. The sacral wound also continues to show signs of necrotic tissue  overlying and has declined. Overall we really have not seen a significant improvement in the past several months in regard to the sacral wound and now patient is starting to develop a new wound in the right Ischial region. Obviously this is not trending in the direction that we want to see. No fevers, chills, nausea, or vomiting noted at this time. 05/19/17; I have not seen this wound and almost a month however there is nothing really positive to say about it. Necrotic tissue over the surface which superiorly I think abuts on her sacrum. She has surrounding erythema. I would be surprised if there is not underlying osteomyelitis or soft tissue infection. 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 She apparently eats well per description although I wonder about this looking at her. Lab work I did probably 4 weeks ago however was really quite normal including a serum albumin 05/26/17; culture I did last week grew Escherichia coli and methicillin sensitive staph aureus which should've been well covered by the Augmentin and ciprofloxacin. Indeed the erythema around the wound in the bed of the wound looks somewhat better. Her intake is still satisfactory. They now have a near fluidized bed 06/02/17; they completed the antibiotics last Friday. Using collagen. Daughter still reports eating and drinking well. There is less visualized erythema around the wound. 06/16/17; large pressure ulcer over her lower sacrum and coccyx. Using [name] to the wound bed. 06/30/17; certainly no change in dimensions of this large stage III wound over her sacrum and coccyx. They've been using [name]. There is no exposed bone. She has a candidal/tinea area in the right inguinal area. Other than that her daughter relates that she is eating and drinking well there changing her positioning to make sure the areas offloaded 07/14/17; no major change in the dimensions of this large stage III wound. Initially a smaller wound that  became secondarily infected causing significant tissue breakdown although it is been stable in the last several weeks. Tunneling superiorly at roughly 1:00 no change here either. There is no bone palpable. Both the patient's daughter and caretaker states that she eats well. I have not rechecked her blood work 07/27/17; patient arrives in clinic today and generally a deteriorated looking state. Mild fever with axillary temperature of 100.4. Daughter reports she has not been eating and drinking well since yesterday. She looks more pale and thin and less responsive. We have been using silver collagen to her wound 08/11/17; since the last time the patient was here things have gone better. Her fever went down and she started eating and drinking again. Culture I did of the wound showed methicillin sensitive staph aureus, Morganella and enterococcus. I only treated her with Keflex which would've not covered the Morganella and enterococcus however the purulent area on the 2:00 side of her wound is a lot better and the bandlike erythema that concern me also was resolved. I'd called the daughter last week to confirm that she was a lot better. She finished the Keflex last Thursday she also suffered a skin tear this morning perhaps while putting on her incontinence brief period is on the lateral aspect of  her right leg. Clean wound with the surface epithelium not viable. 08/25/17; last week the patient was noted to have erythema around the wound margin and a slight fever which the patient's daughter says was 50. Our office was contacted by home health however we did not have a space to work the patient in that she went to see her primary physician Dr. Maurice March. She was not febrile during this visit on 08/21/17 there was erythema around the wound similar to last occasion. Dr. Maurice March in reference to my culture from 07/28/17 and put her on doxycycline capsules which they're opening twice a day for 10 days. 09/17/17;  patient arrives today with the wound bed looking fairly well granulated. There is undermining from 4 to 6:00 although this seems to of contracted slightly. She does not have obvious infection although the daughter states there was some darkening of the wound circumference that is not evident today. They state she is eating well. They are concerned about oral thrush 09/30/16; very fibrin looking granulated wound bed. Her undermining from 4 to 6:00 is about the same but also appears to be well granulated. She has rolled edges of senescent tissue from roughly 7 to 12:00. There is no evidence of infection [name], [name] ([id number]) 10/13/17 on evaluation today patient appears to be doing fairly well all things considered in regard to her sacral wound. There's really not a lot of significant change or improvement she does have some evidence of contusion and deep tissue injury around the left border of the wound that patient's daughter did inquire about today. Nonetheless overall the wound appears to be doing about the same in my opinion. There is no significant indication of infection there also is no significant slough noted at this point. 10/27/17 She is here in follow up evaluation of a sacral ulcer. She is accompanied by her daughter and caregiver. There is red granulation tissue throughout, persistent discoloration to left border; this appears consistent with deep tissue injury. There are multiple areas covered in foam borders, tegaderm, etc that are "preventative" with "no wounds". We will continue with prisma and continue with two week follow ups 11/17/17 on evaluation today patient appears to be doing decently well in regard to her wound at this point. She continues to have a sacral wound ulcer. We see her roughly every two weeks. In the last week she was actually in the hospital due to what was diagnosed as sepsis although no organisms were ever identified in the actual blood cultures. The  hospital really was not sure that the wound was the cause of the infection but they really did not figure out anything else that would be a causative organism she did have a CT scan and that revealed no evidence of pneumonia there was also no evidence of urinary tract infection. Again it very well could have been the wound called in those although wound appears to be doing fairly well at this point. 12/15/17 on evaluation today patient actually appears to be doing about the same in regard to her sacral ulcer. The one thing different is that she does seem to have a rash where her right arm is contracted and being held to the thorax. The area underlying both on the ventral side of the arm as well is the thorax where it comes in contact shows evidence of a rash which appears to be fungal in nature. There does not appear to be any evidence of infection lies at this point. There does not  appear to be a rash consistent with shingles which was also of concern initially. No fevers, chills, nausea, or vomiting noted at this time. 12/29/17 on evaluation today patient appears to be doing a little better in regard to her sacral wound although the one changes the 12 o'clock location of the wound seems to have attached at one point which no longer allows this to pull back. This has caused an area of undermining that seems to be attaching as well in the 12 o'clock location. I do think that this is something that can be managed and is not necessarily a bad thing. Nonetheless she does seem to have some pain with exploration of this region of undermining. She continues to have an area under her right arm on the chest wall of erythema although this is a little better especially after the home health nurse is actually got in order for Diflucan times one for the patient. She is a new area on the left wrist that looks like because of the contraction her nail may have pushed in on her wrist area causing a slight cut which  subsequently became infected. She has some honey crusted drainage noted. 01/12/18 on evaluation today patient is seen with her caregiver in the office at this point. The patient's daughter who normally accompanies her was not present for the evaluation. With that being said in regard to her sacral wound things actually appear to be doing a little bit better with more adherence at the 12 o'clock location of the wound to the bed. There still does appear to be an area of undermining/tunneling at the 12 o'clock location very specifically which still does have some depth to it. Fortunately there does not appear to be any evidence of significant infection. Otherwise her rash under the right breast region seems to be doing better and in general the sores on her bilateral lower extremities are also improving we have been using Xeroform gauze of these locations. Her wrist ulcer on the left has actually healed. Electronic Signature(s) Signed: 01/13/2018 12:00:18 AM By: Lenda Kelp PA-C Entered By: Lenda Kelp on 01/12/2018 23:04:07 [name] ([id number]) -------------------------------------------------------------------------------- Physical Exam Details Patient Name: [name] Date of Service: 01/12/2018 10:15 AM Medical Record Number: [id number] Patient Account Number: [id number] Date of Birth/Sex: [birth year] (84 y.o. F) Treating RN: Phillis Haggis Primary Care Provider: Vonita Moss Other Clinician: Referring Provider: Vonita Moss Treating Provider/Extender: STONE III, Jeanne Diefendorf Weeks in Treatment: 61 Constitutional Well-nourished and well-hydrated in no acute distress. Respiratory normal breathing without difficulty. clear to auscultation bilaterally. Cardiovascular regular rate and rhythm with normal S1, S2. Psychiatric Patient is not able to cooperate in decision making regarding care. Patient has dementia. patient is confused. Notes None of the patient's wounds  actually required sharp debridement on evaluation today. The wounds were mechanically cleaned utilizing saline and gauze without complication at this point. She overall appears to be doing rather well with the current measures. Electronic Signature(s) Signed: 01/13/2018 12:00:18 AM By: Lenda Kelp PA-C Entered By: Lenda Kelp on 01/12/2018 23:05:01 [name] ([id number]) -------------------------------------------------------------------------------- Physician Orders Details Patient Name: [name] Date of Service: 01/12/2018 10:15 AM Medical Record Number: [id number] Patient Account Number: [id number] Date of Birth/Sex: 28-Jan-1923 (84 y.o. F) Treating RN: Phillis Haggis Primary Care Provider: Vonita Moss Other Clinician: Referring Provider: Vonita Moss Treating Provider/Extender: Linwood Dibbles, Sanam Marmo Weeks in Treatment: 40 Verbal / Phone Orders: Yes Clinician: Ashok Cordia, Debi Read Back and Verified: Yes Diagnosis Coding ICD-10  Coding Code Description L89.153 Pressure ulcer of sacral region, stage 3 G20 Parkinson's disease L03.312 Cellulitis of back [any part except buttock] S81.811D Laceration without foreign body, right lower leg, subsequent encounter Wound Cleansing Wound #1 Midline Coccyx o Clean wound with Normal Saline. o May Shower, gently pat wound dry prior to applying new dressing. Wound #5 Left Lower Leg o Clean wound with Normal Saline. o May Shower, gently pat wound dry prior to applying new dressing. Wound #6 Right Lower Leg o Clean wound with Normal Saline. o May Shower, gently pat wound dry prior to applying new dressing. Anesthetic (add to Medication List) Wound #1 Midline Coccyx o Topical Lidocaine 4% cream applied to wound bed prior to debridement (In Clinic Only). Skin Barriers/Peri-Wound Care Wound #1 Midline Coccyx o Skin Prep - use only where the allevyn sticks not on reddened areas o Antifungal powder-Nystatin  - around peri-wound on coccyx (do not get in wound), under right breast and right arm, right abdomen o Other: - bactroban ointment to left arm Primary Wound Dressing Wound #1 Midline Coccyx o Silver Collagen - prisma ag and pack into undermining as well slightly moisten with saline Wound #5 Left Lower Leg o Xeroform Wound #6 Right Lower Leg o Xeroform [name], [name] ([id number]) Secondary Dressing Wound #1 Midline Coccyx o Dry Gauze o Boardered Foam Dressing - HHRN PLEASE ORDER ALLEVYN LIFE BORDERED FOAM FOR PATIENT (OFF BRANDS IRRITATE HER SKIN) Wound #5 Left Lower Leg o Non-adherent pad o Other - coban, tape, and stretch net Wound #6 Right Lower Leg o Non-adherent pad o Other - coban, tape, and stretch net Dressing Change Frequency Wound #1 Midline Coccyx o Change dressing every day. Wound #5 Left Lower Leg o Change dressing every other day. Wound #6 Right Lower Leg o Change dressing every other day. Follow-up Appointments o Return Appointment in 2 weeks. Off-Loading Wound #1 Midline Coccyx o Roho cushion for wheelchair o Mattress - fluidized air mattress o Turn and reposition every 2 hours Additional Orders / Instructions Wound #1 Midline Coccyx o Increase protein intake. - please add protein supplements to patients diet o Other: - please add vitamin A, vitamin C and zinc supplements to patients diet Home Health Wound #1 Midline Coccyx o Continue Home Health Visits - [facility] o Home Health Nurse may visit PRN to address patientos wound care needs. o FACE TO FACE ENCOUNTER: MEDICARE and MEDICAID PATIENTS: I certify that this patient is under my care and that I had a face-to-face encounter that meets the physician face-to-face encounter requirements with this patient on this date. The encounter with the patient was in whole or in part for the following MEDICAL CONDITION: (primary reason for Home Healthcare) MEDICAL  NECESSITY: I certify, that based on my findings, NURSING services are a medically necessary home health service. HOME BOUND STATUS: I certify that my clinical findings support that this patient is homebound (i.e., Due to illness or injury, pt requires aid of supportive devices such as crutches, cane, wheelchairs, walkers, the use of special transportation or the assistance of another person to leave their place of residence. There is a normal inability to leave the home and doing so requires considerable and taxing effort. Other absences are for medical reasons / religious services and are infrequent or of short duration when for other reasons). o If current dressing causes regression in wound condition, may D/C ordered dressing product/s and apply Normal Saline Moist Dressing daily until next Wound Healing Center / Other MD appointment. Notify  Wound Healing Center of regression in wound condition at [phone]. Tina MainlandSHAW, Nacole W. ([id number]) o Please direct any NON-WOUND related issues/requests for orders to patient's Primary Care Physician - Dr Vonita MossMark Crissman Wound #5 Left Lower Leg o Continue Home Health Visits - [facility] o Home Health Nurse may visit PRN to address patientos wound care needs. o FACE TO FACE ENCOUNTER: MEDICARE and MEDICAID PATIENTS: I certify that this patient is under my care and that I had a face-to-face encounter that meets the physician face-to-face encounter requirements with this patient on this date. The encounter with the patient was in whole or in part for the following MEDICAL CONDITION: (primary reason for Home Healthcare) MEDICAL NECESSITY: I certify, that based on my findings, NURSING services are a medically necessary home health service. HOME BOUND STATUS: I certify that my clinical findings support that this patient is homebound (i.e., Due to illness or injury, pt requires aid of supportive devices such as crutches, cane, wheelchairs, walkers, the  use of special transportation or the assistance of another person to leave their place of residence. There is a normal inability to leave the home and doing so requires considerable and taxing effort. Other absences are for medical reasons / religious services and are infrequent or of short duration when for other reasons). o If current dressing causes regression in wound condition, may D/C ordered dressing product/s and apply Normal Saline Moist Dressing daily until next Wound Healing Center / Other MD appointment. Notify Wound Healing Center of regression in wound condition at [phone]. o Please direct any NON-WOUND related issues/requests for orders to patient's Primary Care Physician - Dr Vonita MossMark Crissman Wound #6 Right [place] o Continue Home Health Visits - [facility] o Home Health Nurse may visit PRN to address patientos wound care needs. o FACE TO FACE ENCOUNTER: MEDICARE and MEDICAID PATIENTS: I certify that this patient is under my care and that I had a face-to-face encounter that meets the physician face-to-face encounter requirements with this patient on this date. The encounter with the patient was in whole or in part for the following MEDICAL CONDITION: (primary reason for Home Healthcare) MEDICAL NECESSITY: I certify, that based on my findings, NURSING services are a medically necessary home health service. HOME BOUND STATUS: I certify that my clinical findings support that this patient is homebound (i.e., Due to illness or injury, pt requires aid of supportive devices such as crutches, cane, wheelchairs, walkers, the use of special transportation or the assistance of another person to leave their place of residence. There is a normal inability to leave the home and doing so requires considerable and taxing effort. Other absences are for medical reasons / religious services and are infrequent or of short duration when for other reasons). o If current dressing causes  regression in wound condition, may D/C ordered dressing product/s and apply Normal Saline Moist Dressing daily until next Wound Healing Center / Other MD appointment. Notify Wound Healing Center of regression in wound condition at [phone]. o Please direct any NON-WOUND related issues/requests for orders to patient's Primary Care Physician - Dr Vonita MossMark Crissman Patient Medications Allergies: Phenergan, Sulfa (Sulfonamide Antibiotics) Notifications Medication Indication Start End lidocaine DOSE 1 - topical 4 % cream - 1 cream topical Electronic Signature(s) Signed: 01/12/2018 3:21:08 PM By: Alejandro MullingPinkerton, Debra Signed: 01/13/2018 12:00:18 AM By: Lenda KelpStone III, Tonie Vizcarrondo PA-C Entered By: Alejandro MullingPinkerton, Debra on 01/12/2018 11:28:46 Tina MainlandSHAW, Edina W. ([id number]) -------------------------------------------------------------------------------- Prescription 01/12/2018 Patient Name: Tina MainlandSHAW, Dustina W. Provider: Lenda KelpSTONE III, Tonilynn Bieker PA-C Date of Birth: 11-20-1922  NPI#: [id number] Sex: F DEA#: [id number] Phone #: [phone] License #: Patient Address: [facility] [address] [facility] [place], [place] [address] [address], [address] [place], [place] [address] [phone] Allergies Phenergan Sulfa (Sulfonamide Antibiotics) Medication Medication: Route: Strength: Form: lidocaine topical 4% cream Class: TOPICAL LOCAL ANESTHETICS Dose: Frequency / Time: Indication: 1 1 cream topical Number of Refills: Number of Units: 0 Generic Substitution: Start Date: End Date: Administered at Substitution Permitted Facility: Yes Time Administered: Time Discontinued: Note to Pharmacy: Signature(s): Date(s): Electronic Signature(s) Signed: 01/12/2018 3:21:08 PM By: Alejandro Mulling Signed: 01/13/2018 12:00:18 AM By: Riley Lam, [place] ([id number]) Entered By: Alejandro Mulling on 01/12/2018 11:28:48 [name]  ([id number]) --------------------------------------------------------------------------------  Problem List Details Patient Name: [name] Date of Service: 01/12/2018 10:15 AM Medical Record Number: [id number] Patient Account Number: [id number] Date of Birth/Sex: August 08, 1923 (84 y.o. F) Treating RN: Phillis Haggis Primary Care Provider: Vonita Moss Other Clinician: Referring Provider: Vonita Moss Treating Provider/Extender: Linwood Dibbles, Katrina Daddona Weeks in Treatment: 32 Active Problems ICD-10 Impacting Encounter Code Description Active Date Wound Healing Diagnosis L89.153 Pressure ulcer of sacral region, stage 3 01/07/2017 Yes G20 Parkinson's disease 01/07/2017 Yes L03.312 Cellulitis of back [any part except buttock] 07/28/2017 Yes S81.811D Laceration without foreign body, right lower leg, subsequent 08/11/2017 Yes encounter Inactive Problems Resolved Problems Electronic Signature(s) Signed: 01/13/2018 12:00:18 AM By: Lenda Kelp PA-C Entered By: Lenda Kelp on 01/12/2018 23:02:35 [name] ([id number]) -------------------------------------------------------------------------------- Progress Note Details Patient Name: [name] Date of Service: 01/12/2018 10:15 AM Medical Record Number: [id number] Patient Account Number: [id number] Date of Birth/Sex: October 12, 1922 (84 y.o. F) Treating RN: Phillis Haggis Primary Care Provider: Vonita Moss Other Clinician: Referring Provider: Vonita Moss Treating Provider/Extender: Linwood Dibbles, Breely Panik Weeks in Treatment: 82 Subjective Chief Complaint Information obtained from Patient patient is here for follow up evaluation of a sacral pressure ulcer History of Present Illness (HPI) 01/07/17 this is a 84 year old woman admitted to the clinic today for review of a pressure ulcer on her lower sacrum. She is referred from her primary physician's office after being seen on 3/22 with a 3 cm pressure area. Her daughter and  caretaker accompanied her today state that the area first became obvious about a month ago and his since deteriorated. They have recently got [name] a home health involved and have been using [name] to the wound. They have ordered a pressure relief surface for her mattress. They are turning her religiously. They state that she eats well and they've been forcing fluids on her. She is on a multivitamin. Looking through [facility] point last albumin I see was 4.4 on 10/28. The patient has advanced parkinsonism which looks superficially like advanced Parkinson's disease although her daughter tells me she did not ever respond to Sinemet therefore this may have another pathology with signs of parkinsonism. However I think this is largely a mute point currently. She also has dementia and is nonambulatory. Since this started they have been keeping her in bed and turning her religiously every 2 hours. She lives at home in [place] with her husband with 24/7 care giving 01/13/17 santyl change qd. Still will require further debridement. continue santyl. 01/20/17; patient's wound actually looks some better less adherent necrotic surface. There is actually visible granulation. We're using [name] 01/27/17; better-looking surface but still a lot of necrotic tissue on the base of this wound. The periwound erythema is better than last week we are  still using [name]. Her daughter tells [place] that she is still having trouble with the pressure-relief mattress through medical modalities 02/03/18; I had the patient scheduled for a two week followup however her daughter brought her in early concerned for discoloration on 2 areas of the wound circumference. We have bee using santyl 02/10/17;Better looking surface to the wound. Rim appears better suggesting better offloading. Using santyl 02/24/17; change to Silver Collegen last time. Wound appears better. 03/10/17; still using silver collagen religious offloading. Intake is  satisfactory per her daughter. Dimension slightly better 03/12/2017 -- Dr. Jannetta Quint patient who had been seen 2 days ago and was doing fairly well. The patient is brought in by her daughter who noticed a new wound just above the previous wound on her sacral area and going on more to the left lateral side. She was very concerned and we asked her to get in for an opinion. 03/17/17; above is noted. The patient has developed a progressive area to the left of her original wound. This seems to this started with a ring of red skin with a more pale interior almost looking fungal. There was a rim of blister through part of the area although this did not look like zoster. They have been applying triamcinolone that was prescribed last week by Dr. Meyer Russel and the area has a fold to a linear band area which is confluent, erythematous and with obvious epidermal swelling but there is no overt tenderness or crepitus. She has lost some surface epithelium closer to the wound surface itself and now has a more superficial wound in this area and the extending erythema goes towards the left buttock. This is well demarcated between involved in normal skin but once again does not appear to be at all tender. If there is a contact issue here I cannot get the history out of the daughter or the caregiver that are with her. 03/24/17; the patient arrives today with the wound slightly worse slightly more drainage. The bandlike area of erythema that I treated as a possible pineal infection has improved somewhat although proximally is still has confluent erythema without overt tenderness. We have been using silver alginate since the most recent deterioration. To the bandlike degree of erythema we have been using Lotrisone cream 03/31/17; patient arrives today with the wound slightly larger, necrotic surface and surrounding erythema. The bandlike area of erythema that I treated as a possible pineal infection is less swollen but still  present I've been using Lotrisone cream on that largely related to the presence of a tinea looking infection when this was first seen. We've been using silver alginate. [name], [name] ([id number]) X-ray that I ordered last week showed no acute bony abnormalities mild fecal impaction. Lab work showed a comprehensive metabolic panel that was normal including an albumin of 3.8. White count was 9.5 hemoglobin 12.3 differential count normal. C-reactive protein was less than 1 and sedimentation rate and 17. The latter 2 values does not support an ongoing bacterial infection. 04/14/17; patient arrives after a 2 week hiatus. Her wound is not in good condition. Although the base of the wound looks stable she still has an erythematous area that was apparently blistered over the weekend. This again points to the left. As our intake nurse pointed out today this is in the area where the tissue folds together and we may need to prevent try to prevent this. Lab work and x-ray that I did to 3 weeks ago were unremarkable including her albumin nevertheless she  is an extremely frail condition physically. We have have been using [name] 04/22/17; I changed her to silver alginate because of the surrounding maceration and moisture last week. The daughter did not like the way the wound looked in the middle of the week and changed her back to [place]. They're putting gauze on top of this. Thinks still using Lotrisone. 05/06/17 on evaluation today patient sacral wound appears to be doing okay and does not seem to be any worse. She is having no significant pain during evaluation today the secondary to mental status she is unable to rate or describe whether she had any pain she was not however flinching. Her daughter states that the wound does appear to be looking better to her. Still we are having difficulty with the skinfold that seems to be closing in on itself at this point. All in all I feel like she is making some good  progress in the [name] seems to be the official for her. They do tell me that a refill if we are gonna continue that today. No fevers, chills, nausea, or vomiting noted at this time. 05/13/17 presents today for evaluation concerning her ongoing sacral pressure ulcer. Unfortunately she also has an area of deep tissue injury in the right Ischial region which is starting to show up. The sacral wound also continues to show signs of necrotic tissue overlying and has declined. Overall we really have not seen a significant improvement in the past several months in regard to the sacral wound and now patient is starting to develop a new wound in the right Ischial region. Obviously this is not trending in the direction that we want to see. No fevers, chills, nausea, or vomiting noted at this time. 05/19/17; I have not seen this wound and almost a month however there is nothing really positive to say about it. Necrotic tissue over the surface which superiorly I think abuts on her sacrum. She has surrounding erythema. I would be surprised if there is not underlying osteomyelitis or soft tissue infection. 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 She apparently eats well per description although I wonder about this looking at her. Lab work I did probably 4 weeks ago however was really quite normal including a serum albumin 05/26/17; culture I did last week grew Escherichia coli and methicillin sensitive staph aureus which should've been well covered by the Augmentin and ciprofloxacin. Indeed the erythema around the wound in the bed of the wound looks somewhat better. Her intake is still satisfactory. They now have a near fluidized bed 06/02/17; they completed the antibiotics last Friday. Using collagen. Daughter still reports eating and drinking well. There is less visualized erythema around the wound. 06/16/17; large pressure ulcer over her lower sacrum and coccyx. Using [name] to the wound bed. 06/30/17;  certainly no change in dimensions of this large stage III wound over her sacrum and coccyx. They've been using [name]. There is no exposed bone. She has a candidal/tinea area in the right inguinal area. Other than that her daughter relates that she is eating and drinking well there changing her positioning to make sure the areas offloaded 07/14/17; no major change in the dimensions of this large stage III wound. Initially a smaller wound that became secondarily infected causing significant tissue breakdown although it is been stable in the last several weeks. Tunneling superiorly at roughly 1:00 no change here either. There is no bone palpable. Both the patient's daughter and caretaker states that she eats well.  I have not rechecked her blood work 07/27/17; patient arrives in clinic today and generally a deteriorated looking state. Mild fever with axillary temperature of 100.4. Daughter reports she has not been eating and drinking well since yesterday. She looks more pale and thin and less responsive. We have been using silver collagen to her wound 08/11/17; since the last time the patient was here things have gone better. Her fever went down and she started eating and drinking again. Culture I did of the wound showed methicillin sensitive staph aureus, Morganella and enterococcus. I only treated her with Keflex which would've not covered the Morganella and enterococcus however the purulent area on the 2:00 side of her wound is a lot better and the bandlike erythema that concern me also was resolved. I'd called the daughter last week to confirm that she was a lot better. She finished the Keflex last Thursday she also suffered a skin tear this morning perhaps while putting on her incontinence brief period is on the lateral aspect of her right leg. Clean wound with the surface epithelium not viable. 08/25/17; last week the patient was noted to have erythema around the wound margin and a slight fever  which the patient's daughter says was 69. Our office was contacted by home health however we did not have a space to work the patient in that she went to see her primary physician Dr. Maurice March. She was not febrile during this visit on 08/21/17 there was erythema around the wound similar to last occasion. Dr. Maurice March in reference to my culture from 07/28/17 and put her on doxycycline capsules which they're opening twice a day for 10 days. [name], [name] ([id number]) 09/17/17; patient arrives today with the wound bed looking fairly well granulated. There is undermining from 4 to 6:00 although this seems to of contracted slightly. She does not have obvious infection although the daughter states there was some darkening of the wound circumference that is not evident today. They state she is eating well. They are concerned about oral thrush 09/30/16; very fibrin looking granulated wound bed. Her undermining from 4 to 6:00 is about the same but also appears to be well granulated. She has rolled edges of senescent tissue from roughly 7 to 12:00. There is no evidence of infection 10/13/17 on evaluation today patient appears to be doing fairly well all things considered in regard to her sacral wound. There's really not a lot of significant change or improvement she does have some evidence of contusion and deep tissue injury around the left border of the wound that patient's daughter did inquire about today. Nonetheless overall the wound appears to be doing about the same in my opinion. There is no significant indication of infection there also is no significant slough noted at this point. 10/27/17 She is here in follow up evaluation of a sacral ulcer. She is accompanied by her daughter and caregiver. There is red granulation tissue throughout, persistent discoloration to left border; this appears consistent with deep tissue injury. There are multiple areas covered in foam borders, tegaderm, etc that are  "preventative" with "no wounds". We will continue with prisma and continue with two week follow ups 11/17/17 on evaluation today patient appears to be doing decently well in regard to her wound at this point. She continues to have a sacral wound ulcer. We see her roughly every two weeks. In the last week she was actually in the hospital due to what was diagnosed as sepsis although no organisms were ever  identified in the actual blood cultures. The hospital really was not sure that the wound was the cause of the infection but they really did not figure out anything else that would be a causative organism she did have a CT scan and that revealed no evidence of pneumonia there was also no evidence of urinary tract infection. Again it very well could have been the wound called in those although wound appears to be doing fairly well at this point. 12/15/17 on evaluation today patient actually appears to be doing about the same in regard to her sacral ulcer. The one thing different is that she does seem to have a rash where her right arm is contracted and being held to the thorax. The area underlying both on the ventral side of the arm as well is the thorax where it comes in contact shows evidence of a rash which appears to be fungal in nature. There does not appear to be any evidence of infection lies at this point. There does not appear to be a rash consistent with shingles which was also of concern initially. No fevers, chills, nausea, or vomiting noted at this time. 12/29/17 on evaluation today patient appears to be doing a little better in regard to her sacral wound although the one changes the 12 o'clock location of the wound seems to have attached at one point which no longer allows this to pull back. This has caused an area of undermining that seems to be attaching as well in the 12 o'clock location. I do think that this is something that can be managed and is not necessarily a bad thing. Nonetheless  she does seem to have some pain with exploration of this region of undermining. She continues to have an area under her right arm on the chest wall of erythema although this is a little better especially after the home health nurse is actually got in order for Diflucan times one for the patient. She is a new area on the left wrist that looks like because of the contraction her nail may have pushed in on her wrist area causing a slight cut which subsequently became infected. She has some honey crusted drainage noted. 01/12/18 on evaluation today patient is seen with her caregiver in the office at this point. The patient's daughter who normally accompanies her was not present for the evaluation. With that being said in regard to her sacral wound things actually appear to be doing a little bit better with more adherence at the 12 o'clock location of the wound to the bed. There still does appear to be an area of undermining/tunneling at the 12 o'clock location very specifically which still does have some depth to it. Fortunately there does not appear to be any evidence of significant infection. Otherwise her rash under the right breast region seems to be doing better and in general the sores on her bilateral lower extremities are also improving we have been using Xeroform gauze of these locations. Her wrist ulcer on the left has actually healed. Patient History Unable to Obtain Patient History due to Altered Mental Status. Information obtained from Patient. Social History Never smoker, Marital Status - Married, Alcohol Use - Never, Drug Use - No History, Caffeine Use - Never. Medical And Surgical History Notes [name], [name] ([id number]) Ear/Nose/Mouth/Throat dysphagia Neurologic parkinsons, tremor Review of Systems (ROS) Constitutional Symptoms (General Health) Denies complaints or symptoms of Fever, Chills. Respiratory The patient has no complaints or symptoms. Cardiovascular The patient  has  no complaints or symptoms. Psychiatric The patient has no complaints or symptoms. Objective Constitutional Well-nourished and well-hydrated in no acute distress. Vitals Time Taken: 10:33 AM, Height: 60 in, Weight: 100 lbs, BMI: 19.5, Temperature: 98.3 F, Pulse: 57 bpm, Respiratory Rate: 12 breaths/min, Blood Pressure: 155/64 mmHg. Respiratory normal breathing without difficulty. clear to auscultation bilaterally. Cardiovascular regular rate and rhythm with normal S1, S2. Psychiatric Patient is not able to cooperate in decision making regarding care. Patient has dementia. patient is confused. General Notes: None of the patient's wounds actually required sharp debridement on evaluation today. The wounds were mechanically cleaned utilizing saline and gauze without complication at this point. She overall appears to be doing rather well with the current measures. Integumentary (Hair, Skin) Wound #1 status is Open. Original cause of wound was Pressure Injury. The wound is located on the Midline Coccyx. The wound measures 4.7cm length x 2.8cm width x 0.4cm depth; 10.336cm^2 area and 4.134cm^3 volume. There is muscle and Fat Layer (Subcutaneous Tissue) Exposed exposed. There is no tunneling noted, however, there is undermining starting at 5:00 and ending at 7:00 with a maximum distance of 0.6cm. There is a large amount of serosanguineous drainage noted. Foul odor after cleansing was noted. The wound margin is flat and intact. There is large (67-100%) red granulation within the wound bed. There is a small (1-33%) amount of necrotic tissue within the wound bed including Adherent Slough. The periwound skin appearance exhibited: Induration, Erythema. The periwound skin appearance did not exhibit: Callus, Crepitus, Excoriation, Rash, Scarring, Dry/Scaly, Maceration, Atrophie Blanche, Cyanosis, Ecchymosis, Hemosiderin Staining, Mottled, Pallor, Rubor. The surrounding wound skin color is noted with  erythema which is circumferential. Periwound temperature was noted as No Abnormality. The periwound has tenderness on palpation. Wound #5 status is Open. Original cause of wound was Pressure Injury. The wound is located on the Left Lower Leg. The [name], [name]. ([id number]) wound measures 0.5cm length x 0.5cm width x 0.1cm depth; 0.196cm^2 area and 0.02cm^3 volume. There is no tunneling or undermining noted. There is a medium amount of serous drainage noted. The wound margin is distinct with the outline attached to the wound base. There is large (67-100%) pink granulation within the wound bed. There is no necrotic tissue within the wound bed. The periwound skin appearance did not exhibit: Callus, Crepitus, Excoriation, Induration, Rash, Scarring, Dry/Scaly, Maceration, Atrophie Blanche, Cyanosis, Ecchymosis, Hemosiderin Staining, Mottled, Pallor, Rubor, Erythema. Wound #6 status is Open. Original cause of wound was Not Known. The wound is located on the Right Lower Leg. The wound measures 0.6cm length x 0.6cm width x 0.1cm depth; 0.283cm^2 area and 0.028cm^3 volume. There is no tunneling or undermining noted. There is a medium amount of serous drainage noted. The wound margin is flat and intact. There is large (67-100%) red granulation within the wound bed. There is no necrotic tissue within the wound bed. The periwound skin appearance did not exhibit: Callus, Crepitus, Excoriation, Induration, Rash, Scarring, Dry/Scaly, Maceration, Atrophie Blanche, Cyanosis, Ecchymosis, Hemosiderin Staining, Mottled, Pallor, Rubor, Erythema. Assessment Active Problems ICD-10 L89.153 - Pressure ulcer of sacral region, stage 3 G20 - Parkinson's disease L03.312 - Cellulitis of back [any part except buttock] S81.811D - Laceration without foreign body, right lower leg, subsequent encounter Plan Wound Cleansing: Wound #1 Midline Coccyx: Clean wound with Normal Saline. May Shower, gently pat wound dry prior  to applying new dressing. Wound #5 Left Lower Leg: Clean wound with Normal Saline. May Shower, gently pat wound dry prior to applying new dressing. Wound #  6 Right Lower Leg: Clean wound with Normal Saline. May Shower, gently pat wound dry prior to applying new dressing. Anesthetic (add to Medication List): Wound #1 Midline Coccyx: Topical Lidocaine 4% cream applied to wound bed prior to debridement (In Clinic Only). Skin Barriers/Peri-Wound Care: Wound #1 Midline Coccyx: Skin Prep - use only where the allevyn sticks not on reddened areas Antifungal powder-Nystatin - around peri-wound on coccyx (do not get in wound), under right breast and right arm, right abdomen Other: - bactroban ointment to left arm Primary Wound Dressing: Wound #1 Midline Coccyx: Silver Collagen - prisma ag and pack into undermining as well slightly moisten with saline Wound #5 Left Lower Leg: Xeroform [name], VARDEN. ([id number]) Wound #6 Right Lower Leg: Xeroform Secondary Dressing: Wound #1 Midline Coccyx: Dry Gauze Boardered Foam Dressing - HHRN PLEASE ORDER ALLEVYN LIFE BORDERED FOAM FOR PATIENT (OFF BRANDS IRRITATE HER SKIN) Wound #5 Left Lower Leg: Non-adherent pad Other - coban, tape, and stretch net Wound #6 Right Lower Leg: Non-adherent pad Other - coban, tape, and stretch net Dressing Change Frequency: Wound #1 Midline Coccyx: Change dressing every day. Wound #5 Left Lower Leg: Change dressing every other day. Wound #6 Right Lower Leg: Change dressing every other day. Follow-up Appointments: Return Appointment in 2 weeks. Off-Loading: Wound #1 Midline Coccyx: Roho cushion for wheelchair Mattress - fluidized air mattress Turn and reposition every 2 hours Additional Orders / Instructions: Wound #1 Midline Coccyx: Increase protein intake. - please add protein supplements to patients diet Other: - please add vitamin A, vitamin C and zinc supplements to patients diet Home Health: Wound  #1 Midline Coccyx: Continue Home Health Visits - [facility] Nurse may visit PRN to address patient s wound care needs. FACE TO FACE ENCOUNTER: MEDICARE and MEDICAID PATIENTS: I certify that this patient is under my care and that I had a face-to-face encounter that meets the physician face-to-face encounter requirements with this patient on this date. The encounter with the patient was in whole or in part for the following MEDICAL CONDITION: (primary reason for Home Healthcare) MEDICAL NECESSITY: I certify, that based on my findings, NURSING services are a medically necessary home health service. HOME BOUND STATUS: I certify that my clinical findings support that this patient is homebound (i.e., Due to illness or injury, pt requires aid of supportive devices such as crutches, cane, wheelchairs, walkers, the use of special transportation or the assistance of another person to leave their place of residence. There is a normal inability to leave the home and doing so requires considerable and taxing effort. Other absences are for medical reasons / religious services and are infrequent or of short duration when for other reasons). If current dressing causes regression in wound condition, may D/C ordered dressing product/s and apply Normal Saline Moist Dressing daily until next Wound Healing Center / Other MD appointment. Notify Wound Healing Center of regression in wound condition at [phone]. Please direct any NON-WOUND related issues/requests for orders to patient's Primary Care Physician - Dr Vonita Moss Wound #5 Left Lower Leg: Continue Home Health Visits - [facility] Nurse may visit PRN to address patient s wound care needs. FACE TO FACE ENCOUNTER: MEDICARE and MEDICAID PATIENTS: I certify that this patient is under my care and that I had a face-to-face encounter that meets the physician face-to-face encounter requirements with this patient on this date. The encounter  with the patient was in whole or in part for the following MEDICAL CONDITION: (primary reason for  Home Healthcare) MEDICAL NECESSITY: I certify, that based on my findings, NURSING services are a medically necessary home health service. HOME BOUND STATUS: I certify that my clinical findings support that this patient is homebound (i.e., Due to illness or injury, pt requires aid of supportive devices such as crutches, cane, wheelchairs, walkers, the use of special transportation or the assistance of another person to leave their place of residence. There is a normal inability to leave the home and doing so requires considerable and taxing effort. Other absences are for medical reasons / religious services and are infrequent or of short duration when for other reasons). [name], [name] ([id number]) If current dressing causes regression in wound condition, may D/C ordered dressing product/s and apply Normal Saline Moist Dressing daily until next Wound Healing Center / Other MD appointment. Notify Wound Healing Center of regression in wound condition at [phone]. Please direct any NON-WOUND related issues/requests for orders to patient's Primary Care Physician - Dr Vonita Moss Wound #6 Right Lower Leg: Continue Home Health Visits - [facility] Nurse may visit PRN to address patient s wound care needs. FACE TO FACE ENCOUNTER: MEDICARE and MEDICAID PATIENTS: I certify that this patient is under my care and that I had a face-to-face encounter that meets the physician face-to-face encounter requirements with this patient on this date. The encounter with the patient was in whole or in part for the following MEDICAL CONDITION: (primary reason for Home Healthcare) MEDICAL NECESSITY: I certify, that based on my findings, NURSING services are a medically necessary home health service. HOME BOUND STATUS: I certify that my clinical findings support that this patient is homebound (i.e., Due  to illness or injury, pt requires aid of supportive devices such as crutches, cane, wheelchairs, walkers, the use of special transportation or the assistance of another person to leave their place of residence. There is a normal inability to leave the home and doing so requires considerable and taxing effort. Other absences are for medical reasons / religious services and are infrequent or of short duration when for other reasons). If current dressing causes regression in wound condition, may D/C ordered dressing product/s and apply Normal Saline Moist Dressing daily until next Wound Healing Center / Other MD appointment. Notify Wound Healing Center of regression in wound condition at [phone]. Please direct any NON-WOUND related issues/requests for orders to patient's Primary Care Physician - Dr Vonita Moss The following medication(s) was prescribed: lidocaine topical 4 % cream 1 1 cream topical was prescribed at facility I am going to suggest at this point that we go ahead and continue with the Current wound care measures. Patient caregiver is in agreement with this plan. We will subsequently see were things stand in two weeks time we see her for reevaluation. Please see above for specific wound care orders. We will see patient for re-evaluation in 1 week(s) here in the clinic. If anything worsens or changes patient will contact our office for additional recommendations. Electronic Signature(s) Signed: 01/13/2018 12:00:18 AM By: Lenda Kelp PA-C Entered By: Lenda Kelp on 01/12/2018 23:05:38 [name] ([id number]) -------------------------------------------------------------------------------- ROS/PFSH Details Patient Name: [name] Date of Service: 01/12/2018 10:15 AM Medical Record Number: [id number] Patient Account Number: [id number] Date of Birth/Sex: [birth year] (84 y.o. F) Treating RN: Phillis Haggis Primary Care Provider: Vonita Moss Other  Clinician: Referring Provider: Vonita Moss Treating Provider/Extender: Linwood Dibbles, Donnika Kucher Weeks in Treatment: 35 Unable to Obtain Patient History due to oo Altered Mental Status Information Obtained From  Patient Wound History Do you currently have one or more open woundso Yes How many open wounds do you currently haveo 1 Approximately how long have you had your woundso 1 week How have you been treating your wound(s) until nowo santyl and gauze Has your wound(s) ever healed and then re-openedo No Have you had any lab work done in the past montho No Have you tested positive for an antibiotic resistant organism (MRSA, VRE)o No Have you tested positive for osteomyelitis (bone infection)o No Have you had any tests for circulation on your legso No Constitutional Symptoms (General Health) Complaints and Symptoms: Negative for: Fever; Chills Eyes Medical History: Negative for: Cataracts; Glaucoma; Optic Neuritis Ear/Nose/Mouth/Throat Medical History: Negative for: Chronic sinus problems/congestion; Middle ear problems Past Medical History Notes: dysphagia Hematologic/Lymphatic Medical History: Positive for: Anemia Negative for: Hemophilia; Human Immunodeficiency Virus; Lymphedema; Sickle Cell Disease Respiratory Complaints and Symptoms: No Complaints or Symptoms Medical History: Negative for: Aspiration; Asthma; Chronic Obstructive Pulmonary Disease (COPD); Pneumothorax; Sleep Apnea; Tuberculosis Cardiovascular [name], DURNELL. ([id number]) Complaints and Symptoms: No Complaints or Symptoms Medical History: Positive for: Hypertension Negative for: Angina; Arrhythmia; Congestive Heart Failure; Coronary Artery Disease; Deep Vein Thrombosis; Hypotension; Myocardial Infarction; Peripheral Arterial Disease; Peripheral Venous Disease; Phlebitis; Vasculitis Gastrointestinal Medical History: Negative for: Cirrhosis ; Colitis; Crohnos; Hepatitis A; Hepatitis B; Hepatitis  C Endocrine Medical History: Negative for: Type I Diabetes; Type II Diabetes Genitourinary Medical History: Negative for: End Stage Renal Disease Immunological Medical History: Negative for: Lupus Erythematosus; Raynaudos; Scleroderma Integumentary (Skin) Medical History: Negative for: History of Burn; History of pressure wounds Musculoskeletal Medical History: Negative for: Gout; Rheumatoid Arthritis; Osteoarthritis; Osteomyelitis Neurologic Medical History: Positive for: Dementia Negative for: Neuropathy; Quadriplegia; Paraplegia; Seizure Disorder Past Medical History Notes: parkinsons, tremor Oncologic Medical History: Negative for: Received Chemotherapy; Received Radiation Psychiatric Complaints and Symptoms: No Complaints or Symptoms Medical History: Negative for: Anorexia/bulimia; Confinement Anxiety [name], [name] ([id number]) Immunizations Pneumococcal Vaccine: Received Pneumococcal Vaccination: Yes Immunization Notes: up to date Implantable Devices Family and Social History Never smoker; Marital Status - Married; Alcohol Use: Never; Drug Use: No History; Caffeine Use: Never; Financial Concerns: No; Food, Clothing or Shelter Needs: No; Support System Lacking: No; Transportation Concerns: No; Advanced Directives: Yes (Not Provided); Patient does not want information on Advanced Directives; Medical Power of Attorney: Yes - dtr (Not Provided) Physician Affirmation I have reviewed and agree with the above information. Electronic Signature(s) Signed: 01/13/2018 12:00:18 AM By: Lenda Kelp PA-C Signed: 01/14/2018 5:13:37 PM By: Alejandro Mulling Entered By: Lenda Kelp on 01/12/2018 23:04:30 [name] ([id number]) -------------------------------------------------------------------------------- SuperBill Details Patient Name: [name] Date of Service: 01/12/2018 Medical Record Number: [id number] Patient Account Number: [id number] Date of  Birth/Sex: Apr 14, 1923 (84 y.o. F) Treating RN: Phillis Haggis Primary Care Provider: Vonita Moss Other Clinician: Referring Provider: Vonita Moss Treating Provider/Extender: Linwood Dibbles, Maxime Beckner Weeks in Treatment: 52 Diagnosis Coding ICD-10 Codes Code Description L89.153 Pressure ulcer of sacral region, stage 3 G20 Parkinson's disease L03.312 Cellulitis of back [any part except buttock] S81.811D Laceration without foreign body, right lower leg, subsequent encounter Facility Procedures CPT4 Code: [id number] Description: 99214 - WOUND CARE VISIT-LEV 4 EST PT Modifier: Quantity: 1 Physician Procedures CPT4 Code: [id number] Description: 99214 - WC PHYS LEVEL 4 - EST PT ICD-10 Diagnosis Description L89.153 Pressure ulcer of sacral region, stage 3 G20 Parkinson's disease L03.312 Cellulitis of back [any part except buttock] S81.811D Laceration without foreign body, right lower  leg, subseque Modifier: nt encounter Quantity: 1 Electronic Signature(s) Signed:  01/13/2018 12:00:18 AM By: Lenda Kelp PA-C Entered By: Lenda Kelp on 01/12/2018 23:07:00

## 2018-01-26 ENCOUNTER — Encounter: Payer: Medicare Other | Admitting: Physician Assistant

## 2018-01-26 DIAGNOSIS — L89153 Pressure ulcer of sacral region, stage 3: Secondary | ICD-10-CM | POA: Diagnosis not present

## 2018-01-30 NOTE — Progress Notes (Signed)
Tina, [name] ([id number]) Visit Report for 01/26/2018 Arrival Information Details Patient Name: Tina, [name] Date of Service: 01/26/2018 10:15 AM Medical Record Number: [id number] Patient Account Number: [id number] Date of Birth/Sex: [birth year] (84 y.o. F) Treating RN: [name] Primary Care [name]: [name] Other Clinician: Referring [name]: [name] Treating [name]/Extender: [name], [name] in Treatment: 54 Visit Information History Since Last Visit Added or deleted any medications: No Patient Arrived: Wheel Chair Any new allergies or adverse reactions: No Arrival Time: 10:10 Had a fall or experienced change in No activities of daily living that may affect Accompanied By: dtr risk of falls: Transfer Assistance: Manual Signs or symptoms of abuse/neglect since No Patient Identification Verified: Yes last visito Secondary Verification Process Completed: Yes Hospitalized since last visit: No Patient Requires Transmission-Based No Implantable device outside of the clinic No Precautions: excluding Patient Has Alerts: No cellular tissue based products placed in the center since last visit: Has Dressing in Place as Prescribed: Yes Pain Present Now: Unable to Respond Electronic Signature(s) Signed: 01/26/2018 3:57:25 PM By: [name] Entered By: [name] on 01/26/2018 10:10:20 [name] ([id number]) -------------------------------------------------------------------------------- Clinic Level of Care Assessment Details Patient Name: [name] Date of Service: 01/26/2018 10:15 AM Medical Record Number: [id number] Patient Account Number: [id number] Date of Birth/Sex: [birth year] (84 y.o. F) Treating RN: [name] Primary Care Anneke Cundy: [name] Other Clinician: Referring [name]: [name] Treating Damond Borchers/Extender: [name], [name] in Treatment: 54 Clinic Level of Care Assessment Items TOOL 4  Quantity Score X - Use when only an EandM is performed on FOLLOW-UP visit 1 0 ASSESSMENTS - Nursing Assessment / Reassessment X - Reassessment of Co-morbidities (includes updates in patient status) 1 10 X- 1 5 Reassessment of Adherence to Treatment Plan ASSESSMENTS - Wound and Skin Assessment / Reassessment X - Simple Wound Assessment / Reassessment - one wound 1 5  - 0 Complex Wound Assessment / Reassessment - multiple wounds  - 0 Dermatologic / Skin Assessment (not related to wound area) ASSESSMENTS - Focused Assessment  - Circumferential Edema Measurements - multi extremities 0  - 0 Nutritional Assessment / Counseling / Intervention  - 0 Lower Extremity Assessment (monofilament, tuning fork, pulses)  - 0 Peripheral Arterial Disease Assessment (using hand held doppler) ASSESSMENTS - Ostomy and/or Continence Assessment and Care  - Incontinence Assessment and Management 0  - 0 Ostomy Care Assessment and Management (repouching, etc.) PROCESS - Coordination of Care  - Simple Patient / Family Education for ongoing care 0 X- 1 20 Complex (extensive) Patient / Family Education for ongoing care X- 1 10 Staff obtains Chiropractor, Records, Test Results / Process Orders X- 1 10 Staff telephones HHA, Nursing Homes / Clarify orders / etc  - 0 Routine Transfer to another Facility (non-emergent condition)  - 0 Routine Hospital Admission (non-emergent condition)  - 0 New Admissions / Manufacturing engineer / Ordering NPWT, Apligraf, etc.  - 0 Emergency Hospital Admission (emergent condition) X- 1 10 Simple Discharge Coordination CAM, HARNDEN. ([id number])  - 0 Complex (extensive) Discharge Coordination PROCESS - Special Needs  - Pediatric / Minor Patient Management 0  - 0 Isolation Patient Management  - 0 Hearing / Language / Visual special needs  - 0 Assessment of Community assistance (transportation, D/C planning, etc.)  - 0 Additional  assistance / Altered mentation  - 0 Support Surface(s) Assessment (bed, cushion, seat, etc.) INTERVENTIONS - Wound Cleansing / Measurement X - Simple Wound Cleansing - one wound 1 5  -  0 Complex Wound Cleansing - multiple wounds X- 1 5 Wound Imaging (photographs - any number of wounds)  - 0 Wound Tracing (instead of photographs) X- 1 5 Simple Wound Measurement - one wound  - 0 Complex Wound Measurement - multiple wounds INTERVENTIONS - Wound Dressings  - Small Wound Dressing one or multiple wounds 0 X- 1 15 Medium Wound Dressing one or multiple wounds  - 0 Large Wound Dressing one or multiple wounds X- 1 5 Application of Medications - topical  - 0 Application of Medications - injection INTERVENTIONS - Miscellaneous  - External ear exam 0  - 0 Specimen Collection (cultures, biopsies, blood, body fluids, etc.)  - 0 Specimen(s) / Culture(s) sent or taken to Lab for analysis  - 0 Patient Transfer (multiple staff / Nurse, adult / Similar devices)  - 0 Simple Staple / Suture removal (25 or less)  - 0 Complex Staple / Suture removal (26 or more)  - 0 Hypo / Hyperglycemic Management (close monitor of Blood Glucose)  - 0 Ankle / Brachial Index (ABI) - do not check if billed separately X- 1 5 Vital Signs [name], [name] ([id number]) Has the patient been seen at the hospital within the last three years: Yes Total Score: 110 Level Of Care: New/Established - Level 3 Electronic Signature(s) Signed: 01/27/2018 4:27:21 PM By: [name] Entered By: [name] on 01/26/2018 12:54:31 [name] ([id number]) -------------------------------------------------------------------------------- Encounter Discharge Information Details Patient Name: [name] Date of Service: 01/26/2018 10:15 AM Medical Record Number: [id number] Patient Account Number: [id number] Date of Birth/Sex: Sep 09, 1923 (84 y.o. F) Treating RN: [name] Primary Care Jahkeem Kurka: [name] Other Clinician: Referring Makailee Nudelman: [name] Treating Iysis Germain/Extender: [name], [name] in Treatment: 75 Encounter Discharge Information Items Schedule Follow-up Appointment: No Medication Reconciliation completed and No provided to Patient/Care Adolph Clutter: Provided on Clinical Summary of Care: 01/26/2018 Form Type Recipient Paper Patient ES Electronic Signature(s) Signed: 01/27/2018 8:52:06 AM By: Gwenlyn Perking Entered By: Gwenlyn Perking on 01/26/2018 11:12:49 [name] ([id number]) -------------------------------------------------------------------------------- Lower Extremity Assessment Details Patient Name: [name] Date of Service: 01/26/2018 10:15 AM Medical Record Number: [id number] Patient Account Number: [id number] Date of Birth/Sex: 08-13-1923 (84 y.o. F) Treating RN: [name] Primary Care Nikiesha Milford: [name] Other Clinician: Referring Kayona Foor: [name] Treating Keno Caraway/Extender: [name], [name] in Treatment: 54 Electronic Signature(s) Signed: 01/26/2018 3:57:25 PM By: [name] Entered By: [name] on 01/26/2018 10:14:16 [name] ([id number]) -------------------------------------------------------------------------------- Multi Wound Chart Details Patient Name: [name] Date of Service: 01/26/2018 10:15 AM Medical Record Number: [id number] Patient Account Number: [id number] Date of Birth/Sex: November 18, 1922 (84 y.o. F) Treating RN: [name] Primary Care Jessamyn Watterson: [name] Other Clinician: Referring Jasilyn Holderman: [name] Treating Narek Kniss/Extender: [name], [name] in Treatment: 54 Vital Signs Height(in): 60 Pulse(bpm): 60 Weight(lbs): 100 Blood Pressure(mmHg): 125/67 Body Mass Index(BMI): 20 Temperature(F): 98.0 Respiratory Rate 14 (breaths/min): Photos: [1:No Photos] [5:No Photos] [6:No Photos] Wound Location: [1:Coccyx  - Midline] [5:Left Lower Leg] [6:Right Lower Leg] Wounding Event: [1:Pressure Injury] [5:Pressure Injury] [6:Not Known] Primary Etiology: [1:Pressure Ulcer] [5:Pressure Ulcer] [6:Skin Tear] Comorbid History: [1:Anemia, Hypertension, Dementia] [5:N/A] [6:N/A] Date Acquired: [1:12/01/2016] [5:12/09/2017] [6:01/12/2018] [name] of Treatment: [1:54] [5:6] [6:2] Wound Status: [1:Open] [5:Healed - Epithelialized] [6:Healed - Epithelialized] Measurements L x W x D [1:4.7x2.6x0.4] [5:0x0x0] [6:0x0x0] (cm) Area (cm) : [1:9.598] [5:0] [6:0] Volume (cm) : [1:3.839] [5:0] [6:0] % Reduction in Area: [1:-131.00%] [5:100.00%] [6:100.00%] % Reduction in Volume: [1:-131.00%] [5:100.00%] [6:100.00%] Starting Position 1 [  1:5] (o'clock): Ending Position 1 [1:7] (o'clock): Maximum Distance 1 (cm): [1:1.6] Undermining: [1:Yes] [5:N/A] [6:N/A] Classification: [1:Category/Stage IV] [5:Unstageable/Unclassified] [6:Partial Thickness] Exudate Amount: [1:Large] [5:N/A] [6:N/A] Exudate Type: [1:Serosanguineous] [5:N/A] [6:N/A] Exudate Color: [1:red, brown] [5:N/A] [6:N/A] Foul Odor After Cleansing: [1:Yes] [5:N/A] [6:N/A] Odor Anticipated Due to [1:No] [5:N/A] [6:N/A] Product Use: Wound Margin: [1:Flat and Intact] [5:N/A] [6:N/A] Granulation Amount: [1:Large (67-100%)] [5:N/A] [6:N/A] Granulation Quality: [1:Red] [5:N/A] [6:N/A] Necrotic Amount: [1:Small (1-33%)] [5:N/A] [6:N/A] Exposed Structures: [1:Fat Layer (Subcutaneous Tissue) Exposed: Yes Muscle: Yes Fascia: No Tendon: No] [5:N/A] [6:N/A] Joint: No Bone: No Epithelialization: None N/A N/A Periwound Skin Texture: Induration: Yes No Abnormalities Noted No Abnormalities Noted Excoriation: No Callus: No Crepitus: No Rash: No Scarring: No Periwound Skin Moisture: Maceration: Yes No Abnormalities Noted No Abnormalities Noted Dry/Scaly: No Periwound Skin Color: Erythema: Yes No Abnormalities Noted No Abnormalities Noted Atrophie Blanche:  No Cyanosis: No Ecchymosis: No Hemosiderin Staining: No Mottled: No Pallor: No Rubor: No Erythema Location: Circumferential N/A N/A Temperature: No Abnormality N/A N/A Tenderness on Palpation: Yes No No Wound Preparation: Ulcer Cleansing: N/A N/A Rinsed/Irrigated with Saline Topical Anesthetic Applied: Other: lidocaine 4% Treatment Notes Electronic Signature(s) Signed: 01/27/2018 4:27:21 PM By: [name] Entered By: [name] on 01/26/2018 10:39:04 [name] ([id number]) -------------------------------------------------------------------------------- Multi-Disciplinary Care Plan Details Patient Name: [name] Date of Service: 01/26/2018 10:15 AM Medical Record Number: [id number] Patient Account Number: [id number] Date of Birth/Sex: 1923-09-27 (84 y.o. F) Treating RN: [name] Primary Care Dymond Spreen: [name] Other Clinician: Referring Danuta Huseman: [name] Treating Macaila Tahir/Extender: [name], [name] in Treatment: 21 Active Inactive ` Abuse / Safety / Falls / Self Care Management Nursing Diagnoses: Impaired physical mobility Potential for falls Goals: Patient will remain injury free Date Initiated: 01/07/2017 Target Resolution Date: 04/03/2017 Goal Status: Active Interventions: Assess fall risk on admission and as needed Notes: ` Nutrition Nursing Diagnoses: Potential for alteratiion in Nutrition/Potential for imbalanced nutrition Goals: Patient/caregiver agrees to and verbalizes understanding of need to use nutritional supplements and/or vitamins as prescribed Date Initiated: 01/07/2017 Target Resolution Date: 04/03/2017 Goal Status: Active Interventions: Assess patient nutrition upon admission and as needed per policy Notes: ` Orientation to the Wound Care Program Nursing Diagnoses: Knowledge deficit related to the wound healing center program Goals: Patient/caregiver will verbalize understanding of the Wound  Healing Center Program Date Initiated: 01/07/2017 Target Resolution Date: 04/03/2017 Goal Status: Active Tina, [name] ([id number]) Interventions: Provide education on orientation to the wound center Notes: ` Pressure Nursing Diagnoses: Knowledge deficit related to causes and risk factors for pressure ulcer development Goals: Patient will remain free from development of additional pressure ulcers Date Initiated: 01/07/2017 Target Resolution Date: 04/03/2017 Goal Status: Active Interventions: Assess potential for pressure ulcer upon admission and as needed Notes: ` Wound/Skin Impairment Nursing Diagnoses: Impaired tissue integrity Goals: Patient/caregiver will verbalize understanding of skin care regimen Date Initiated: 01/07/2017 Target Resolution Date: 04/03/2017 Goal Status: Active Ulcer/skin breakdown will have a volume reduction of 30% by week 4 Date Initiated: 01/07/2017 Target Resolution Date: 04/03/2017 Goal Status: Active Ulcer/skin breakdown will have a volume reduction of 50% by week 8 Date Initiated: 01/07/2017 Target Resolution Date: 04/03/2017 Goal Status: Active Ulcer/skin breakdown will have a volume reduction of 80% by week 12 Date Initiated: 01/07/2017 Target Resolution Date: 04/03/2017 Goal Status: Active Ulcer/skin breakdown will heal within 14 [name] Date Initiated: 01/07/2017 Target Resolution Date: 04/03/2017 Goal Status: Active Interventions: Assess patient/caregiver ability to obtain necessary supplies Assess patient/caregiver ability to perform ulcer/skin care regimen upon admission  and as needed Assess ulceration(s) every visit Notes: Electronic Signature(s) Tina, [name] ([id number]) Signed: 01/27/2018 4:27:21 PM By: [name] Entered By: [name] on 01/26/2018 10:38:52 [name] ([id number]) -------------------------------------------------------------------------------- Pain Assessment Details Patient Name: [name] Date of Service: 01/26/2018 10:15 AM Medical Record Number: [id number] Patient Account Number: [id number] Date of Birth/Sex: March 07, 1923 (84 y.o. F) Treating RN: [name] Primary Care Maressa Apollo: [name] Other Clinician: Referring Doryan Bahl: [name] Treating Jazzma Neidhardt/Extender: [name], [name] in Treatment: 41 Active Problems Location of Pain Severity and Description of Pain Patient Has Paino Patient Unable to Respond Site Locations Pain Management and Medication Current Pain Management: Electronic Signature(s) Signed: 01/26/2018 3:57:25 PM By: [name] Entered By: [name] on 01/26/2018 10:10:32 [name] ([id number]) -------------------------------------------------------------------------------- Wound Assessment Details Patient Name: [name] Date of Service: 01/26/2018 10:15 AM Medical Record Number: [id number] Patient Account Number: [id number] Date of Birth/Sex: Apr 01, 1923 (84 y.o. F) Treating RN: [name] Primary Care Corona Popovich: [name] Other Clinician: Referring Stepfanie Yott: [name] Treating Joory Gough/Extender: [name], [name] in Treatment: 54 Wound Status Wound Number: 1 Primary Etiology: Pressure Ulcer Wound Location: Coccyx - Midline Wound Status: Open Wounding Event: Pressure Injury Comorbid History: Anemia, Hypertension, Dementia Date Acquired: 12/01/2016 [name] Of Treatment: 54 Clustered Wound: No Photos Photo Uploaded By: [name] on 01/26/2018 12:54:38 Wound Measurements Length: (cm) 4.7 Width: (cm) 2.6 Depth: (cm) 0.4 Area: (cm) 9.598 Volume: (cm) 3.839 % Reduction in Area: -131% % Reduction in Volume: -131% Epithelialization: None Tunneling: No Undermining: Yes Starting Position (o'clock): 5 Ending Position (o'clock): 7 Maximum Distance: (cm) 1.6 Wound Description Classification: Category/Stage IV Wound Margin: Flat and Intact Exudate Amount: Large Exudate Type:  Serosanguineous Exudate Color: red, brown Foul Odor After Cleansing: Yes Due to Product Use: No Slough/Fibrino Yes Wound Bed Granulation Amount: Large (67-100%) Exposed Structure Granulation Quality: Red Fascia Exposed: No Necrotic Amount: Small (1-33%) Fat Layer (Subcutaneous Tissue) Exposed: Yes Necrotic Quality: Adherent Slough Tendon Exposed: No Muscle Exposed: Yes Tina, [name]. ([id number]) Necrosis of Muscle: No Joint Exposed: No Bone Exposed: No Periwound Skin Texture Texture Color No Abnormalities Noted: No No Abnormalities Noted: No Callus: No Atrophie Blanche: No Crepitus: No Cyanosis: No Excoriation: No Ecchymosis: No Induration: Yes Erythema: Yes Rash: No Erythema Location: Circumferential Scarring: No Hemosiderin Staining: No Mottled: No Moisture Pallor: No No Abnormalities Noted: No Rubor: No Dry / Scaly: No Maceration: Yes Temperature / Pain Temperature: No Abnormality Tenderness on Palpation: Yes Wound Preparation Ulcer Cleansing: Rinsed/Irrigated with Saline Topical Anesthetic Applied: Other: lidocaine 4%, Electronic Signature(s) Signed: 01/26/2018 3:57:25 PM By: [name] Entered By: [name] on 01/26/2018 10:24:59 [name] ([id number]) -------------------------------------------------------------------------------- Wound Assessment Details Patient Name: [name] Date of Service: 01/26/2018 10:15 AM Medical Record Number: [id number] Patient Account Number: [id number] Date of Birth/Sex: Sep 28, 1923 (84 y.o. F) Treating RN: [name] Primary Care Sahiba Granholm: [name] Other Clinician: Referring Kwabena Strutz: [name] Treating Jazzmyn Filion/Extender: STONE III, [name] in Treatment: 54 Wound Status Wound Number: 5 Primary Etiology: Pressure Ulcer Wound Location: Left Lower Leg Wound Status: Healed - Epithelialized Wounding Event: Pressure Injury Date Acquired: 12/09/2017 [name] Of Treatment:  6 Clustered Wound: No Photos Photo Uploaded By: [name] on 01/26/2018 12:54:39 Wound Measurements Length: (cm) 0 Width: (cm) 0 Depth: (cm) 0 Area: (cm) 0 Volume: (cm) 0 % Reduction in Area: 100% % Reduction in Volume: 100% Wound Description Classification: Unstageable/Unclassified Periwound Skin Texture Texture Color No Abnormalities Noted: No No Abnormalities Noted: No Moisture  No Abnormalities Noted: No Electronic Signature(s) Signed: 01/26/2018 3:57:25 PM By: [name] Entered By: [name] on 01/26/2018 10:27:31 [name] ([id number]) -------------------------------------------------------------------------------- Wound Assessment Details Patient Name: [name] Date of Service: 01/26/2018 10:15 AM Medical Record Number: [id number] Patient Account Number: [id number] Date of Birth/Sex: 1923/04/25 (84 y.o. F) Treating RN: [name] Primary Care Emeline Simpson: [name] Other Clinician: Referring Trev Boley: [name] Treating Takahiro Godinho/Extender: [name], [name] in Treatment: 54 Wound Status Wound Number: 6 Primary Etiology: Skin Tear Wound Location: Right Lower Leg Wound Status: Healed - Epithelialized Wounding Event: Not Known Date Acquired: 01/12/2018 [name] Of Treatment: 2 Clustered Wound: No Photos Photo Uploaded By: [name] on 01/26/2018 12:54:54 Wound Measurements Length: (cm) 0 Width: (cm) 0 Depth: (cm) 0 Area: (cm) 0 Volume: (cm) 0 % Reduction in Area: 100% % Reduction in Volume: 100% Wound Description Classification: Partial Thickness Periwound Skin Texture Texture Color No Abnormalities Noted: No No Abnormalities Noted: No Moisture No Abnormalities Noted: No Electronic Signature(s) Signed: 01/26/2018 3:57:25 PM By: [name] Entered By: [name] on 01/26/2018 10:27:31 [name]  ([id number]) -------------------------------------------------------------------------------- Vitals Details Patient Name: [name] Date of Service: 01/26/2018 10:15 AM Medical Record Number: [id number] Patient Account Number: [id number] Date of Birth/Sex: 02-25-1923 (84 y.o. F) Treating RN: [name] Primary Care Reigan Tolliver: [name] Other Clinician: Referring Safa Derner: [name] Treating Kenard Morawski/Extender: [name], [name] in Treatment: 54 Vital Signs Time Taken: 10:13 Temperature (F): 98.0 Height (in): 60 Pulse (bpm): 60 Weight (lbs): 100 Respiratory Rate (breaths/min): 14 Body Mass Index (BMI): 19.5 Blood Pressure (mmHg): 125/67 Reference Range: 80 - 120 mg / dl Electronic Signature(s) Signed: 01/26/2018 3:57:25 PM By: [name] Entered By: [name] on 01/26/2018 10:14:07

## 2018-01-30 NOTE — Progress Notes (Signed)
[name], [name] ([id number]) Visit Report for 01/26/2018 Chief Complaint Document Details Patient Name: [name], [name] Date of Service: 01/26/2018 10:15 AM Medical Record Number: [id number] Patient Account Number: [id number] Date of Birth/Sex: [birth year] (84 y.o. F) Treating RN: Phillis Haggis Primary Care Provider: Vonita Moss Other Clinician: Referring Provider: Vonita Moss Treating Provider/Extender: Linwood Dibbles, Milika Ventress Weeks in Treatment: 49 Information Obtained from: Patient Chief Complaint patient is here for follow up evaluation of a sacral pressure ulcer Electronic Signature(s) Signed: 01/26/2018 5:03:20 PM By: Lenda Kelp PA-C Entered By: Lenda Kelp on 01/26/2018 10:21:15 [name] ([id number]) -------------------------------------------------------------------------------- HPI Details Patient Name: [name] Date of Service: 01/26/2018 10:15 AM Medical Record Number: [id number] Patient Account Number: [id number] Date of Birth/Sex: [birth year] (84 y.o. F) Treating RN: Phillis Haggis Primary Care Provider: Vonita Moss Other Clinician: Referring Provider: Vonita Moss Treating Provider/Extender: Linwood Dibbles, Sahara Fujimoto Weeks in Treatment: 55 History of Present Illness HPI Description: 01/07/17 this is a 84 year old woman admitted to the clinic today for review of a pressure ulcer on her lower sacrum. She is referred from her primary physician's office after being seen on 3/22 with a 3 cm pressure area. Her daughter and caretaker accompanied her today state that the area first became obvious about a month ago and his since deteriorated. They have recently got [name] home health involved and have been using [name] to the wound. They have ordered a pressure relief surface for her mattress. They are turning her religiously. They state that she eats well and they've been forcing fluids on her. She is on a multivitamin. Looking through [facility] point last  albumin I see was 4.4 on 10/28. The patient has advanced parkinsonism which looks superficially like advanced Parkinson's disease although her daughter tells me she did not ever respond to Sinemet therefore this may have another pathology with signs of parkinsonism. However I think this is largely a mute point currently. She also has dementia and is nonambulatory. Since this started they have been keeping her in bed and turning her religiously every 2 hours. She lives at home in [place] with her husband with 24/7 care giving 01/13/17 santyl change qd. Still will require further debridement. continue santyl. 01/20/17; patient's wound actually looks some better less adherent necrotic surface. There is actually visible granulation. We're using [name] 01/27/17; better-looking surface but still a lot of necrotic tissue on the base of this wound. The periwound erythema is better than last week we are still using [name]. Her daughter tells [place] that she is still having trouble with the pressure-relief mattress through medical modalities 02/03/18; I had the patient scheduled for a two week followup however her daughter brought her in early concerned for discoloration on 2 areas of the wound circumference. We have bee using santyl 02/10/17;Better looking surface to the wound. Rim appears better suggesting better offloading. Using santyl 02/24/17; change to Silver Collegen last time. Wound appears better. 03/10/17; still using silver collagen religious offloading. Intake is satisfactory per her daughter. Dimension slightly better 03/12/2017 -- Dr. Jannetta Quint patient who had been seen 2 days ago and was doing fairly well. The patient is brought in by her daughter who noticed a new wound just above the previous wound on her sacral area and going on more to the left lateral side. She was very concerned and we asked her to get in for an opinion. 03/17/17; above is noted. The patient has developed a progressive area to the  left of her original wound. This  seems to this started with a ring of red skin with a more pale interior almost looking fungal. There was a rim of blister through part of the area although this did not look like zoster. They have been applying triamcinolone that was prescribed last week by Dr. Meyer Russel and the area has a fold to a linear band area which is confluent, erythematous and with obvious epidermal swelling but there is no overt tenderness or crepitus. She has lost some surface epithelium closer to the wound surface itself and now has a more superficial wound in this area and the extending erythema goes towards the left buttock. This is well demarcated between involved in normal skin but once again does not appear to be at all tender. If there is a contact issue here I cannot get the history out of the daughter or the caregiver that are with her. 03/24/17; the patient arrives today with the wound slightly worse slightly more drainage. The bandlike area of erythema that I treated as a possible pineal infection has improved somewhat although proximally is still has confluent erythema without overt tenderness. We have been using silver alginate since the most recent deterioration. To the bandlike degree of erythema we have been using Lotrisone cream 03/31/17; patient arrives today with the wound slightly larger, necrotic surface and surrounding erythema. The bandlike area of erythema that I treated as a possible pineal infection is less swollen but still present I've been using Lotrisone cream on that largely related to the presence of a tinea looking infection when this was first seen. We've been using silver alginate. X-ray that I ordered last week showed no acute bony abnormalities mild fecal impaction. Lab work showed a comprehensive metabolic panel that was normal including an albumin of 3.8. White count was 9.5 hemoglobin 12.3 differential count normal. C-reactive protein was less than 1 and  sedimentation rate and 17. The latter 2 values does not support an ongoing bacterial infection. 04/14/17; patient arrives after a 2 week hiatus. Her wound is not in good condition. Although the base of the wound looks [name], [name]. ([id number]) stable she still has an erythematous area that was apparently blistered over the weekend. This again points to the left. As our intake nurse pointed out today this is in the area where the tissue folds together and we may need to prevent try to prevent this. Lab work and x-ray that I did to 3 weeks ago were unremarkable including her albumin nevertheless she is an extremely frail condition physically. We have have been using [name] 04/22/17; I changed her to silver alginate because of the surrounding maceration and moisture last week. The daughter did not like the way the wound looked in the middle of the week and changed her back to [place]. They're putting gauze on top of this. Thinks still using Lotrisone. 05/06/17 on evaluation today patient sacral wound appears to be doing okay and does not seem to be any worse. She is having no significant pain during evaluation today the secondary to mental status she is unable to rate or describe whether she had any pain she was not however flinching. Her daughter states that the wound does appear to be looking better to her. Still we are having difficulty with the skinfold that seems to be closing in on itself at this point. All in all I feel like she is making some good progress in the [name] seems to be the official for her. They do tell me that a refill if  we are gonna continue that today. No fevers, chills, nausea, or vomiting noted at this time. 05/13/17 presents today for evaluation concerning her ongoing sacral pressure ulcer. Unfortunately she also has an area of deep tissue injury in the right Ischial region which is starting to show up. The sacral wound also continues to show signs of necrotic tissue  overlying and has declined. Overall we really have not seen a significant improvement in the past several months in regard to the sacral wound and now patient is starting to develop a new wound in the right Ischial region. Obviously this is not trending in the direction that we want to see. No fevers, chills, nausea, or vomiting noted at this time. 05/19/17; I have not seen this wound and almost a month however there is nothing really positive to say about it. Necrotic tissue over the surface which superiorly I think abuts on her sacrum. She has surrounding erythema. I would be surprised if there is not underlying osteomyelitis or soft tissue infection. 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 She apparently eats well per description although I wonder about this looking at her. Lab work I did probably 4 weeks ago however was really quite normal including a serum albumin 05/26/17; culture I did last week grew Escherichia coli and methicillin sensitive staph aureus which should've been well covered by the Augmentin and ciprofloxacin. Indeed the erythema around the wound in the bed of the wound looks somewhat better. Her intake is still satisfactory. They now have a near fluidized bed 06/02/17; they completed the antibiotics last Friday. Using collagen. Daughter still reports eating and drinking well. There is less visualized erythema around the wound. 06/16/17; large pressure ulcer over her lower sacrum and coccyx. Using [name] to the wound bed. 06/30/17; certainly no change in dimensions of this large stage III wound over her sacrum and coccyx. They've been using [name]. There is no exposed bone. She has a candidal/tinea area in the right inguinal area. Other than that her daughter relates that she is eating and drinking well there changing her positioning to make sure the areas offloaded 07/14/17; no major change in the dimensions of this large stage III wound. Initially a smaller wound that  became secondarily infected causing significant tissue breakdown although it is been stable in the last several weeks. Tunneling superiorly at roughly 1:00 no change here either. There is no bone palpable. Both the patient's daughter and caretaker states that she eats well. I have not rechecked her blood work 07/27/17; patient arrives in clinic today and generally a deteriorated looking state. Mild fever with axillary temperature of 100.4. Daughter reports she has not been eating and drinking well since yesterday. She looks more pale and thin and less responsive. We have been using silver collagen to her wound 08/11/17; since the last time the patient was here things have gone better. Her fever went down and she started eating and drinking again. Culture I did of the wound showed methicillin sensitive staph aureus, Morganella and enterococcus. I only treated her with Keflex which would've not covered the Morganella and enterococcus however the purulent area on the 2:00 side of her wound is a lot better and the bandlike erythema that concern me also was resolved. I'd called the daughter last week to confirm that she was a lot better. She finished the Keflex last Thursday she also suffered a skin tear this morning perhaps while putting on her incontinence brief period is on the lateral aspect of  her right leg. Clean wound with the surface epithelium not viable. 08/25/17; last week the patient was noted to have erythema around the wound margin and a slight fever which the patient's daughter says was 55. Our office was contacted by home health however we did not have a space to work the patient in that she went to see her primary physician Dr. Maurice March. She was not febrile during this visit on 08/21/17 there was erythema around the wound similar to last occasion. Dr. Maurice March in reference to my culture from 07/28/17 and put her on doxycycline capsules which they're opening twice a day for 10 days. 09/17/17;  patient arrives today with the wound bed looking fairly well granulated. There is undermining from 4 to 6:00 although this seems to of contracted slightly. She does not have obvious infection although the daughter states there was some darkening of the wound circumference that is not evident today. They state she is eating well. They are concerned about oral thrush 09/30/16; very fibrin looking granulated wound bed. Her undermining from 4 to 6:00 is about the same but also appears to be well granulated. She has rolled edges of senescent tissue from roughly 7 to 12:00. There is no evidence of infection [name], [name] ([id number]) 10/13/17 on evaluation today patient appears to be doing fairly well all things considered in regard to her sacral wound. There's really not a lot of significant change or improvement she does have some evidence of contusion and deep tissue injury around the left border of the wound that patient's daughter did inquire about today. Nonetheless overall the wound appears to be doing about the same in my opinion. There is no significant indication of infection there also is no significant slough noted at this point. 10/27/17 She is here in follow up evaluation of a sacral ulcer. She is accompanied by her daughter and caregiver. There is red granulation tissue throughout, persistent discoloration to left border; this appears consistent with deep tissue injury. There are multiple areas covered in foam borders, tegaderm, etc that are "preventative" with "no wounds". We will continue with prisma and continue with two week follow ups 11/17/17 on evaluation today patient appears to be doing decently well in regard to her wound at this point. She continues to have a sacral wound ulcer. We see her roughly every two weeks. In the last week she was actually in the hospital due to what was diagnosed as sepsis although no organisms were ever identified in the actual blood cultures. The  hospital really was not sure that the wound was the cause of the infection but they really did not figure out anything else that would be a causative organism she did have a CT scan and that revealed no evidence of pneumonia there was also no evidence of urinary tract infection. Again it very well could have been the wound called in those although wound appears to be doing fairly well at this point. 12/15/17 on evaluation today patient actually appears to be doing about the same in regard to her sacral ulcer. The one thing different is that she does seem to have a rash where her right arm is contracted and being held to the thorax. The area underlying both on the ventral side of the arm as well is the thorax where it comes in contact shows evidence of a rash which appears to be fungal in nature. There does not appear to be any evidence of infection lies at this point. There does not  appear to be a rash consistent with shingles which was also of concern initially. No fevers, chills, nausea, or vomiting noted at this time. 12/29/17 on evaluation today patient appears to be doing a little better in regard to her sacral wound although the one changes the 12 o'clock location of the wound seems to have attached at one point which no longer allows this to pull back. This has caused an area of undermining that seems to be attaching as well in the 12 o'clock location. I do think that this is something that can be managed and is not necessarily a bad thing. Nonetheless she does seem to have some pain with exploration of this region of undermining. She continues to have an area under her right arm on the chest wall of erythema although this is a little better especially after the home health nurse is actually got in order for Diflucan times one for the patient. She is a new area on the left wrist that looks like because of the contraction her nail may have pushed in on her wrist area causing a slight cut which  subsequently became infected. She has some honey crusted drainage noted. 01/12/18 on evaluation today patient is seen with her caregiver in the office at this point. The patient's daughter who normally accompanies her was not present for the evaluation. With that being said in regard to her sacral wound things actually appear to be doing a little bit better with more adherence at the 12 o'clock location of the wound to the bed. There still does appear to be an area of undermining/tunneling at the 12 o'clock location very specifically which still does have some depth to it. Fortunately there does not appear to be any evidence of significant infection. Otherwise her rash under the right breast region seems to be doing better and in general the sores on her bilateral lower extremities are also improving we have been using Xeroform gauze of these locations. Her wrist ulcer on the left has actually healed. 01/26/18 on evaluation today patient's wounds of her lower extremities actually appear to be doing better and overall this is good news currently. In fact these are all healed. It's mainly her sacral wound which is still open although it does show some signs of filling in in regard to the areas of undermining at this time. With all that being said I do still think that is gonna be some time before this wound will fully heal and I do believe that the hyper granular tissue may need to be addressed at some point although right now I'm more interested in getting this to fill in before addressing the hyper granulation. Electronic Signature(s) Signed: 01/26/2018 5:03:20 PM By: Lenda Kelp PA-C Entered By: Lenda Kelp on 01/26/2018 16:28:02 [name] ([id number]) -------------------------------------------------------------------------------- Physical Exam Details Patient Name: [name] Date of Service: 01/26/2018 10:15 AM Medical Record Number: [id number] Patient Account Number:  [id number] Date of Birth/Sex: [birth year] (84 y.o. F) Treating RN: Phillis Haggis Primary Care Provider: Vonita Moss Other Clinician: Referring Provider: Vonita Moss Treating Provider/Extender: STONE III, Denisia Harpole Weeks in Treatment: 44 Constitutional Well-nourished and well-hydrated in no acute distress. Respiratory normal breathing without difficulty. clear to auscultation bilaterally. Cardiovascular regular rate and rhythm with normal S1, S2. Psychiatric Patient is not able to cooperate in decision making regarding care. Patient has dementia. patient is confused. Notes Patient's wound again shows hyper granulation of the sacral region but fortunately there is no evidence of infection which  is good news. Obviously this is something that we have to keep a close eye on as she is prone at times to becoming infected and has had to have even hospitalization secondary to this. Electronic Signature(s) Signed: 01/26/2018 5:03:20 PM By: Lenda Kelp PA-C Entered By: Lenda Kelp on 01/26/2018 16:29:18 [name] ([id number]) -------------------------------------------------------------------------------- Physician Orders Details Patient Name: [name] Date of Service: 01/26/2018 10:15 AM Medical Record Number: [id number] Patient Account Number: [id number] Date of Birth/Sex: 1923/04/12 (84 y.o. F) Treating RN: Phillis Haggis Primary Care Provider: Vonita Moss Other Clinician: Referring Provider: Vonita Moss Treating Provider/Extender: Linwood Dibbles, Cristalle Rohm Weeks in Treatment: 65 Verbal / Phone Orders: Yes Clinician: Ashok Cordia, Debi Read Back and Verified: Yes Diagnosis Coding ICD-10 Coding Code Description L89.153 Pressure ulcer of sacral region, stage 3 G20 Parkinson's disease L03.312 Cellulitis of back [any part except buttock] S81.811D Laceration without foreign body, right lower leg, subsequent encounter Wound Cleansing Wound #1 Midline Coccyx o Clean  wound with Normal Saline. o May Shower, gently pat wound dry prior to applying new dressing. Anesthetic (add to Medication List) Wound #1 Midline Coccyx o Topical Lidocaine 4% cream applied to wound bed prior to debridement (In Clinic Only). Skin Barriers/Peri-Wound Care Wound #1 Midline Coccyx o Skin Prep - use only where the allevyn sticks not on reddened areas o Antifungal powder-Nystatin - around peri-wound on coccyx (do not get in wound), under right breast and right arm, right abdomen o Other: - bactroban ointment to left arm Primary Wound Dressing Wound #1 Midline Coccyx o Silver Collagen - prisma ag and pack into undermining as well slightly moisten with saline Secondary Dressing Wound #1 Midline Coccyx o Dry Gauze o Boardered Foam Dressing - HHRN PLEASE ORDER ALLEVYN LIFE BORDERED FOAM FOR PATIENT (OFF BRANDS IRRITATE HER SKIN) Dressing Change Frequency Wound #1 Midline Coccyx o Change dressing every day. Follow-up Appointments o Return Appointment in 2 weeks. [name], [name] ([id number]) Off-Loading Wound #1 Midline Coccyx o Roho cushion for wheelchair o Mattress - fluidized air mattress o Turn and reposition every 2 hours Additional Orders / Instructions Wound #1 Midline Coccyx o Increase protein intake. - please add protein supplements to patients diet o Other: - please add vitamin A, vitamin C and zinc supplements to patients diet Home Health Wound #1 Midline Coccyx o Continue Home Health Visits - [facility] o Home Health Nurse may visit PRN to address patientos wound care needs. o FACE TO FACE ENCOUNTER: MEDICARE and MEDICAID PATIENTS: I certify that this patient is under my care and that I had a face-to-face encounter that meets the physician face-to-face encounter requirements with this patient on this date. The encounter with the patient was in whole or in part for the following MEDICAL CONDITION: (primary reason for Home  Healthcare) MEDICAL NECESSITY: I certify, that based on my findings, NURSING services are a medically necessary home health service. HOME BOUND STATUS: I certify that my clinical findings support that this patient is homebound (i.e., Due to illness or injury, pt requires aid of supportive devices such as crutches, cane, wheelchairs, walkers, the use of special transportation or the assistance of another person to leave their place of residence. There is a normal inability to leave the home and doing so requires considerable and taxing effort. Other absences are for medical reasons / religious services and are infrequent or of short duration when for other reasons). o If current dressing causes regression in wound condition, may D/C ordered dressing product/s and apply Normal  Saline Moist Dressing daily until next Wound Healing Center / Other MD appointment. Notify Wound Healing Center of regression in wound condition at [phone]. o Please direct any NON-WOUND related issues/requests for orders to patient's Primary Care Physician - Dr Vonita Moss Patient Medications Allergies: Phenergan, Sulfa (Sulfonamide Antibiotics) Notifications Medication Indication Start End lidocaine DOSE 1 - topical 4 % cream - 1 cream topical Electronic Signature(s) Signed: 01/26/2018 5:03:20 PM By: Lenda Kelp PA-C Signed: 01/27/2018 4:27:21 PM By: Alejandro Mulling Entered By: Alejandro Mulling on 01/26/2018 10:50:05 [name] ([id number]) -------------------------------------------------------------------------------- Prescription 01/26/2018 Patient Name: [name] Provider: Lenda Kelp PA-C Date of Birth: [birth year] NPI#: [id number] Sex: F DEA#: [id number] Phone #: [phone] License #: Patient Address: [facility] [address] [facility] [place], [place] [address] [address] [place], [place]  [address] [phone] Allergies Phenergan Sulfa (Sulfonamide Antibiotics) Medication Medication: Route: Strength: Form: lidocaine 4 % topical cream topical 4% cream Class: TOPICAL LOCAL ANESTHETICS Dose: Frequency / Time: Indication: 1 1 cream topical Number of Refills: Number of Units: 0 Generic Substitution: Start Date: End Date: One Time Use: Substitution Permitted No Note to Pharmacy: Signature(s): Date(s): Electronic Signature(s) Signed: 01/26/2018 5:03:20 PM By: Lenda Kelp PA-C Signed: 01/27/2018 4:27:21 PM By: Alejandro Mulling Entered By: Alejandro Mulling on 01/26/2018 10:50:06 [name] ([id number]) [name] ([id number]) --------------------------------------------------------------------------------  Problem List Details Patient Name: [name] Date of Service: 01/26/2018 10:15 AM Medical Record Number: [id number] Patient Account Number: [id number] Date of Birth/Sex: May 19, 1923 (84 y.o. F) Treating RN: Phillis Haggis Primary Care Provider: Vonita Moss Other Clinician: Referring Provider: Vonita Moss Treating Provider/Extender: Linwood Dibbles, Snyder Colavito Weeks in Treatment: 25 Active Problems ICD-10 Impacting Encounter Code Description Active Date Wound Healing Diagnosis L89.153 Pressure ulcer of sacral region, stage 3 01/07/2017 Yes G20 Parkinson's disease 01/07/2017 Yes L03.312 Cellulitis of back [any part except buttock] 07/28/2017 Yes S81.811D Laceration without foreign body, right lower leg, subsequent 08/11/2017 Yes encounter Inactive Problems Resolved Problems Electronic Signature(s) Signed: 01/26/2018 5:03:20 PM By: Lenda Kelp PA-C Entered By: Lenda Kelp on 01/26/2018 10:21:07 [name] ([id number]) -------------------------------------------------------------------------------- Progress Note Details Patient Name: [name] Date of Service: 01/26/2018 10:15 AM Medical Record Number: [id number] Patient  Account Number: [id number] Date of Birth/Sex: October 30, 1922 (84 y.o. F) Treating RN: Phillis Haggis Primary Care Provider: Vonita Moss Other Clinician: Referring Provider: Vonita Moss Treating Provider/Extender: Linwood Dibbles, Sarthak Rubenstein Weeks in Treatment: 7 Subjective Chief Complaint Information obtained from Patient patient is here for follow up evaluation of a sacral pressure ulcer History of Present Illness (HPI) 01/07/17 this is a 84 year old woman admitted to the clinic today for review of a pressure ulcer on her lower sacrum. She is referred from her primary physician's office after being seen on 3/22 with a 3 cm pressure area. Her daughter and caretaker accompanied her today state that the area first became obvious about a month ago and his since deteriorated. They have recently got [name] home health involved and have been using [name] to the wound. They have ordered a pressure relief surface for her mattress. They are turning her religiously. They state that she eats well and they've been forcing fluids on her. She is on a multivitamin. Looking through [facility] point last albumin I see was 4.4 on 10/28. The patient has advanced parkinsonism which looks superficially like advanced Parkinson's disease although her daughter tells me she did not ever respond to Sinemet therefore  this may have another pathology with signs of parkinsonism. However I think this is largely a mute point currently. She also has dementia and is nonambulatory. Since this started they have been keeping her in bed and turning her religiously every 2 hours. She lives at home in [place] with her husband with 24/7 care giving 01/13/17 santyl change qd. Still will require further debridement. continue santyl. 01/20/17; patient's wound actually looks some better less adherent necrotic surface. There is actually visible granulation. We're using [name] 01/27/17; better-looking surface but still a lot of necrotic tissue  on the base of this wound. The periwound erythema is better than last week we are still using [name]. Her daughter tells [place] that she is still having trouble with the pressure-relief mattress through medical modalities 02/03/18; I had the patient scheduled for a two week followup however her daughter brought her in early concerned for discoloration on 2 areas of the wound circumference. We have bee using santyl 02/10/17;Better looking surface to the wound. Rim appears better suggesting better offloading. Using santyl 02/24/17; change to Silver Collegen last time. Wound appears better. 03/10/17; still using silver collagen religious offloading. Intake is satisfactory per her daughter. Dimension slightly better 03/12/2017 -- Dr. Jannetta Quint patient who had been seen 2 days ago and was doing fairly well. The patient is brought in by her daughter who noticed a new wound just above the previous wound on her sacral area and going on more to the left lateral side. She was very concerned and we asked her to get in for an opinion. 03/17/17; above is noted. The patient has developed a progressive area to the left of her original wound. This seems to this started with a ring of red skin with a more pale interior almost looking fungal. There was a rim of blister through part of the area although this did not look like zoster. They have been applying triamcinolone that was prescribed last week by Dr. Meyer Russel and the area has a fold to a linear band area which is confluent, erythematous and with obvious epidermal swelling but there is no overt tenderness or crepitus. She has lost some surface epithelium closer to the wound surface itself and now has a more superficial wound in this area and the extending erythema goes towards the left buttock. This is well demarcated between involved in normal skin but once again does not appear to be at all tender. If there is a contact issue here I cannot get the history out of the  daughter or the caregiver that are with her. 03/24/17; the patient arrives today with the wound slightly worse slightly more drainage. The bandlike area of erythema that I treated as a possible pineal infection has improved somewhat although proximally is still has confluent erythema without overt tenderness. We have been using silver alginate since the most recent deterioration. To the bandlike degree of erythema we have been using Lotrisone cream 03/31/17; patient arrives today with the wound slightly larger, necrotic surface and surrounding erythema. The bandlike area of erythema that I treated as a possible pineal infection is less swollen but still present I've been using Lotrisone cream on that largely related to the presence of a tinea looking infection when this was first seen. We've been using silver alginate. [name], [name] ([id number]) X-ray that I ordered last week showed no acute bony abnormalities mild fecal impaction. Lab work showed a comprehensive metabolic panel that was normal including an albumin of 3.8. White count was 9.5 hemoglobin 12.3  differential count normal. C-reactive protein was less than 1 and sedimentation rate and 17. The latter 2 values does not support an ongoing bacterial infection. 04/14/17; patient arrives after a 2 week hiatus. Her wound is not in good condition. Although the base of the wound looks stable she still has an erythematous area that was apparently blistered over the weekend. This again points to the left. As our intake nurse pointed out today this is in the area where the tissue folds together and we may need to prevent try to prevent this. Lab work and x-ray that I did to 3 weeks ago were unremarkable including her albumin nevertheless she is an extremely frail condition physically. We have have been using [name] 04/22/17; I changed her to silver alginate because of the surrounding maceration and moisture last week. The daughter did not like the  way the wound looked in the middle of the week and changed her back to [place]. They're putting gauze on top of this. Thinks still using Lotrisone. 05/06/17 on evaluation today patient sacral wound appears to be doing okay and does not seem to be any worse. She is having no significant pain during evaluation today the secondary to mental status she is unable to rate or describe whether she had any pain she was not however flinching. Her daughter states that the wound does appear to be looking better to her. Still we are having difficulty with the skinfold that seems to be closing in on itself at this point. All in all I feel like she is making some good progress in the [name] seems to be the official for her. They do tell me that a refill if we are gonna continue that today. No fevers, chills, nausea, or vomiting noted at this time. 05/13/17 presents today for evaluation concerning her ongoing sacral pressure ulcer. Unfortunately she also has an area of deep tissue injury in the right Ischial region which is starting to show up. The sacral wound also continues to show signs of necrotic tissue overlying and has declined. Overall we really have not seen a significant improvement in the past several months in regard to the sacral wound and now patient is starting to develop a new wound in the right Ischial region. Obviously this is not trending in the direction that we want to see. No fevers, chills, nausea, or vomiting noted at this time. 05/19/17; I have not seen this wound and almost a month however there is nothing really positive to say about it. Necrotic tissue over the surface which superiorly I think abuts on her sacrum. She has surrounding erythema. I would be surprised if there is not underlying osteomyelitis or soft tissue infection. 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 She apparently eats well per description although I wonder about this looking at her. Lab work I did probably 4  weeks ago however was really quite normal including a serum albumin 05/26/17; culture I did last week grew Escherichia coli and methicillin sensitive staph aureus which should've been well covered by the Augmentin and ciprofloxacin. Indeed the erythema around the wound in the bed of the wound looks somewhat better. Her intake is still satisfactory. They now have a near fluidized bed 06/02/17; they completed the antibiotics last Friday. Using collagen. Daughter still reports eating and drinking well. There is less visualized erythema around the wound. 06/16/17; large pressure ulcer over her lower sacrum and coccyx. Using [name] to the wound bed. 06/30/17; certainly no change in dimensions of  this large stage III wound over her sacrum and coccyx. They've been using [name]. There is no exposed bone. She has a candidal/tinea area in the right inguinal area. Other than that her daughter relates that she is eating and drinking well there changing her positioning to make sure the areas offloaded 07/14/17; no major change in the dimensions of this large stage III wound. Initially a smaller wound that became secondarily infected causing significant tissue breakdown although it is been stable in the last several weeks. Tunneling superiorly at roughly 1:00 no change here either. There is no bone palpable. Both the patient's daughter and caretaker states that she eats well. I have not rechecked her blood work 07/27/17; patient arrives in clinic today and generally a deteriorated looking state. Mild fever with axillary temperature of 100.4. Daughter reports she has not been eating and drinking well since yesterday. She looks more pale and thin and less responsive. We have been using silver collagen to her wound 08/11/17; since the last time the patient was here things have gone better. Her fever went down and she started eating and drinking again. Culture I did of the wound showed methicillin sensitive staph  aureus, Morganella and enterococcus. I only treated her with Keflex which would've not covered the Morganella and enterococcus however the purulent area on the 2:00 side of her wound is a lot better and the bandlike erythema that concern me also was resolved. I'd called the daughter last week to confirm that she was a lot better. She finished the Keflex last Thursday she also suffered a skin tear this morning perhaps while putting on her incontinence brief period is on the lateral aspect of her right leg. Clean wound with the surface epithelium not viable. 08/25/17; last week the patient was noted to have erythema around the wound margin and a slight fever which the patient's daughter says was 29. Our office was contacted by home health however we did not have a space to work the patient in that she went to see her primary physician Dr. Maurice March. She was not febrile during this visit on 08/21/17 there was erythema around the wound similar to last occasion. Dr. Maurice March in reference to my culture from 07/28/17 and put her on doxycycline capsules which they're opening twice a day for 10 days. [name], [name] ([id number]) 09/17/17; patient arrives today with the wound bed looking fairly well granulated. There is undermining from 4 to 6:00 although this seems to of contracted slightly. She does not have obvious infection although the daughter states there was some darkening of the wound circumference that is not evident today. They state she is eating well. They are concerned about oral thrush 09/30/16; very fibrin looking granulated wound bed. Her undermining from 4 to 6:00 is about the same but also appears to be well granulated. She has rolled edges of senescent tissue from roughly 7 to 12:00. There is no evidence of infection 10/13/17 on evaluation today patient appears to be doing fairly well all things considered in regard to her sacral wound. There's really not a lot of significant change or improvement  she does have some evidence of contusion and deep tissue injury around the left border of the wound that patient's daughter did inquire about today. Nonetheless overall the wound appears to be doing about the same in my opinion. There is no significant indication of infection there also is no significant slough noted at this point. 10/27/17 She is here in follow up evaluation of a  sacral ulcer. She is accompanied by her daughter and caregiver. There is red granulation tissue throughout, persistent discoloration to left border; this appears consistent with deep tissue injury. There are multiple areas covered in foam borders, tegaderm, etc that are "preventative" with "no wounds". We will continue with prisma and continue with two week follow ups 11/17/17 on evaluation today patient appears to be doing decently well in regard to her wound at this point. She continues to have a sacral wound ulcer. We see her roughly every two weeks. In the last week she was actually in the hospital due to what was diagnosed as sepsis although no organisms were ever identified in the actual blood cultures. The hospital really was not sure that the wound was the cause of the infection but they really did not figure out anything else that would be a causative organism she did have a CT scan and that revealed no evidence of pneumonia there was also no evidence of urinary tract infection. Again it very well could have been the wound called in those although wound appears to be doing fairly well at this point. 12/15/17 on evaluation today patient actually appears to be doing about the same in regard to her sacral ulcer. The one thing different is that she does seem to have a rash where her right arm is contracted and being held to the thorax. The area underlying both on the ventral side of the arm as well is the thorax where it comes in contact shows evidence of a rash which appears to be fungal in nature. There does not  appear to be any evidence of infection lies at this point. There does not appear to be a rash consistent with shingles which was also of concern initially. No fevers, chills, nausea, or vomiting noted at this time. 12/29/17 on evaluation today patient appears to be doing a little better in regard to her sacral wound although the one changes the 12 o'clock location of the wound seems to have attached at one point which no longer allows this to pull back. This has caused an area of undermining that seems to be attaching as well in the 12 o'clock location. I do think that this is something that can be managed and is not necessarily a bad thing. Nonetheless she does seem to have some pain with exploration of this region of undermining. She continues to have an area under her right arm on the chest wall of erythema although this is a little better especially after the home health nurse is actually got in order for Diflucan times one for the patient. She is a new area on the left wrist that looks like because of the contraction her nail may have pushed in on her wrist area causing a slight cut which subsequently became infected. She has some honey crusted drainage noted. 01/12/18 on evaluation today patient is seen with her caregiver in the office at this point. The patient's daughter who normally accompanies her was not present for the evaluation. With that being said in regard to her sacral wound things actually appear to be doing a little bit better with more adherence at the 12 o'clock location of the wound to the bed. There still does appear to be an area of undermining/tunneling at the 12 o'clock location very specifically which still does have some depth to it. Fortunately there does not appear to be any evidence of significant infection. Otherwise her rash under the right breast region seems to be  doing better and in general the sores on her bilateral lower extremities are also improving we have been  using Xeroform gauze of these locations. Her wrist ulcer on the left has actually healed. 01/26/18 on evaluation today patient's wounds of her lower extremities actually appear to be doing better and overall this is good news currently. In fact these are all healed. It's mainly her sacral wound which is still open although it does show some signs of filling in in regard to the areas of undermining at this time. With all that being said I do still think that is gonna be some time before this wound will fully heal and I do believe that the hyper granular tissue may need to be addressed at some point although right now I'm more interested in getting this to fill in before addressing the hyper granulation. Patient History Unable to Obtain Patient History due to Altered Mental Status. [name], [name] ([id number]) Information obtained from Patient. Social History Never smoker, Marital Status - Married, Alcohol Use - Never, Drug Use - No History, Caffeine Use - Never. Medical And Surgical History Notes Ear/Nose/Mouth/Throat dysphagia Neurologic parkinsons, tremor Review of Systems (ROS) Constitutional Symptoms (General Health) Denies complaints or symptoms of Fever, Chills. Respiratory The patient has no complaints or symptoms. Cardiovascular The patient has no complaints or symptoms. Psychiatric The patient has no complaints or symptoms. Objective Constitutional Well-nourished and well-hydrated in no acute distress. Vitals Time Taken: 10:13 AM, Height: 60 in, Weight: 100 lbs, BMI: 19.5, Temperature: 98.0 F, Pulse: 60 bpm, Respiratory Rate: 14 breaths/min, Blood Pressure: 125/67 mmHg. Respiratory normal breathing without difficulty. clear to auscultation bilaterally. Cardiovascular regular rate and rhythm with normal S1, S2. Psychiatric Patient is not able to cooperate in decision making regarding care. Patient has dementia. patient is confused. General Notes: Patient's wound again  shows hyper granulation of the sacral region but fortunately there is no evidence of infection which is good news. Obviously this is something that we have to keep a close eye on as she is prone at times to becoming infected and has had to have even hospitalization secondary to this. Integumentary (Hair, Skin) Wound #1 status is Open. Original cause of wound was Pressure Injury. The wound is located on the Midline Coccyx. The wound measures 4.7cm length x 2.6cm width x 0.4cm depth; 9.598cm^2 area and 3.839cm^3 volume. There is muscle and Fat Layer (Subcutaneous Tissue) Exposed exposed. There is no tunneling noted, however, there is undermining starting at 5:00 and ending at 7:00 with a maximum distance of 1.6cm. There is a large amount of serosanguineous drainage noted. Foul odor after cleansing was noted. The wound margin is flat and intact. There is large (67-100%) red granulation within the wound bed. There is a small (1-33%) amount of necrotic tissue within the wound bed including Adherent Slough. The HANNAHGRACE, LALLI ([id number]) periwound skin appearance exhibited: Induration, Maceration, Erythema. The periwound skin appearance did not exhibit: Callus, Crepitus, Excoriation, Rash, Scarring, Dry/Scaly, Atrophie Blanche, Cyanosis, Ecchymosis, Hemosiderin Staining, Mottled, Pallor, Rubor. The surrounding wound skin color is noted with erythema which is circumferential. Periwound temperature was noted as No Abnormality. The periwound has tenderness on palpation. Wound #5 status is Healed - Epithelialized. Original cause of wound was Pressure Injury. The wound is located on the Left Lower Leg. The wound measures 0cm length x 0cm width x 0cm depth; 0cm^2 area and 0cm^3 volume. Wound #6 status is Healed - Epithelialized. Original cause of wound was Not Known. The wound is located  on the Right Lower Leg. The wound measures 0cm length x 0cm width x 0cm depth; 0cm^2 area and 0cm^3  volume. Assessment Active Problems ICD-10 L89.153 - Pressure ulcer of sacral region, stage 3 G20 - Parkinson's disease L03.312 - Cellulitis of back [any part except buttock] S81.811D - Laceration without foreign body, right lower leg, subsequent encounter Plan Wound Cleansing: Wound #1 Midline Coccyx: Clean wound with Normal Saline. May Shower, gently pat wound dry prior to applying new dressing. Anesthetic (add to Medication List): Wound #1 Midline Coccyx: Topical Lidocaine 4% cream applied to wound bed prior to debridement (In Clinic Only). Skin Barriers/Peri-Wound Care: Wound #1 Midline Coccyx: Skin Prep - use only where the allevyn sticks not on reddened areas Antifungal powder-Nystatin - around peri-wound on coccyx (do not get in wound), under right breast and right arm, right abdomen Other: - bactroban ointment to left arm Primary Wound Dressing: Wound #1 Midline Coccyx: Silver Collagen - prisma ag and pack into undermining as well slightly moisten with saline Secondary Dressing: Wound #1 Midline Coccyx: Dry Gauze Boardered Foam Dressing - HHRN PLEASE ORDER ALLEVYN LIFE BORDERED FOAM FOR PATIENT (OFF BRANDS IRRITATE HER SKIN) Dressing Change Frequency: Wound #1 Midline Coccyx: Change dressing every day. Follow-up Appointments: Return Appointment in 2 weeks. Off-LoadingALASHIA, [name] ([id number]) Wound #1 Midline Coccyx: Roho cushion for wheelchair Mattress - fluidized air mattress Turn and reposition every 2 hours Additional Orders / Instructions: Wound #1 Midline Coccyx: Increase protein intake. - please add protein supplements to patients diet Other: - please add vitamin A, vitamin C and zinc supplements to patients diet Home Health: Wound #1 Midline Coccyx: Continue Home Health Visits - [facility] Nurse may visit PRN to address patient s wound care needs. FACE TO FACE ENCOUNTER: MEDICARE and MEDICAID PATIENTS: I certify that this patient is  under my care and that I had a face-to-face encounter that meets the physician face-to-face encounter requirements with this patient on this date. The encounter with the patient was in whole or in part for the following MEDICAL CONDITION: (primary reason for Home Healthcare) MEDICAL NECESSITY: I certify, that based on my findings, NURSING services are a medically necessary home health service. HOME BOUND STATUS: I certify that my clinical findings support that this patient is homebound (i.e., Due to illness or injury, pt requires aid of supportive devices such as crutches, cane, wheelchairs, walkers, the use of special transportation or the assistance of another person to leave their place of residence. There is a normal inability to leave the home and doing so requires considerable and taxing effort. Other absences are for medical reasons / religious services and are infrequent or of short duration when for other reasons). If current dressing causes regression in wound condition, may D/C ordered dressing product/s and apply Normal Saline Moist Dressing daily until next Wound Healing Center / Other MD appointment. Notify Wound Healing Center of regression in wound condition at [phone]. Please direct any NON-WOUND related issues/requests for orders to patient's Primary Care Physician - Dr Vonita Moss The following medication(s) was prescribed: lidocaine topical 4 % cream 1 1 cream topical was prescribed at facility I'm gonna suggest currently that we continue with the current wound care orders for the sacral region patient's daughter is in agreement the plan. We will see were things stand at follow-up. Please see above for specific wound care orders. We will see patient for re-evaluation in 1 week(s) here in the clinic. If anything worsens or changes patient will contact  our office for additional recommendations. Electronic Signature(s) Signed: 01/26/2018 5:03:20 PM By: Lenda Kelp  PA-C Entered By: Lenda Kelp on 01/26/2018 16:29:39 [name] ([id number]) -------------------------------------------------------------------------------- ROS/PFSH Details Patient Name: [name] Date of Service: 01/26/2018 10:15 AM Medical Record Number: [id number] Patient Account Number: [id number] Date of Birth/Sex: [birth year] (84 y.o. F) Treating RN: Phillis Haggis Primary Care Provider: Vonita Moss Other Clinician: Referring Provider: Vonita Moss Treating Provider/Extender: Linwood Dibbles, Bradlee Bridgers Weeks in Treatment: 61 Unable to Obtain Patient History due to oo Altered Mental Status Information Obtained From Patient Wound History Do you currently have one or more open woundso Yes How many open wounds do you currently haveo 1 Approximately how long have you had your woundso 1 week How have you been treating your wound(s) until nowo santyl and gauze Has your wound(s) ever healed and then re-openedo No Have you had any lab work done in the past montho No Have you tested positive for an antibiotic resistant organism (MRSA, VRE)o No Have you tested positive for osteomyelitis (bone infection)o No Have you had any tests for circulation on your legso No Constitutional Symptoms (General Health) Complaints and Symptoms: Negative for: Fever; Chills Eyes Medical History: Negative for: Cataracts; Glaucoma; Optic Neuritis Ear/Nose/Mouth/Throat Medical History: Negative for: Chronic sinus problems/congestion; Middle ear problems Past Medical History Notes: dysphagia Hematologic/Lymphatic Medical History: Positive for: Anemia Negative for: Hemophilia; Human Immunodeficiency Virus; Lymphedema; Sickle Cell Disease Respiratory Complaints and Symptoms: No Complaints or Symptoms Medical History: Negative for: Aspiration; Asthma; Chronic Obstructive Pulmonary Disease (COPD); Pneumothorax; Sleep Apnea; Tuberculosis Cardiovascular [name], BOWDITCH.  ([id number]) Complaints and Symptoms: No Complaints or Symptoms Medical History: Positive for: Hypertension Negative for: Angina; Arrhythmia; Congestive Heart Failure; Coronary Artery Disease; Deep Vein Thrombosis; Hypotension; Myocardial Infarction; Peripheral Arterial Disease; Peripheral Venous Disease; Phlebitis; Vasculitis Gastrointestinal Medical History: Negative for: Cirrhosis ; Colitis; Crohnos; Hepatitis A; Hepatitis B; Hepatitis C Endocrine Medical History: Negative for: Type I Diabetes; Type II Diabetes Genitourinary Medical History: Negative for: End Stage Renal Disease Immunological Medical History: Negative for: Lupus Erythematosus; Raynaudos; Scleroderma Integumentary (Skin) Medical History: Negative for: History of Burn; History of pressure wounds Musculoskeletal Medical History: Negative for: Gout; Rheumatoid Arthritis; Osteoarthritis; Osteomyelitis Neurologic Medical History: Positive for: Dementia Negative for: Neuropathy; Quadriplegia; Paraplegia; Seizure Disorder Past Medical History Notes: parkinsons, tremor Oncologic Medical History: Negative for: Received Chemotherapy; Received Radiation Psychiatric Complaints and Symptoms: No Complaints or Symptoms Medical History: Negative for: Anorexia/bulimia; Confinement Anxiety [name], [name] ([id number]) Immunizations Pneumococcal Vaccine: Received Pneumococcal Vaccination: Yes Immunization Notes: up to date Implantable Devices Family and Social History Never smoker; Marital Status - Married; Alcohol Use: Never; Drug Use: No History; Caffeine Use: Never; Financial Concerns: No; Food, Clothing or Shelter Needs: No; Support System Lacking: No; Transportation Concerns: No; Advanced Directives: Yes (Not Provided); Patient does not want information on Advanced Directives; Medical Power of Attorney: Yes - dtr (Not Provided) Physician Affirmation I have reviewed and agree with the above  information. Electronic Signature(s) Signed: 01/26/2018 5:03:20 PM By: Lenda Kelp PA-C Signed: 01/27/2018 4:27:21 PM By: Alejandro Mulling Entered By: Lenda Kelp on 01/26/2018 16:28:48 [name] ([id number]) -------------------------------------------------------------------------------- SuperBill Details Patient Name: [name] Date of Service: 01/26/2018 Medical Record Number: [id number] Patient Account Number: [id number] Date of Birth/Sex: 03-May-1923 (84 y.o. F) Treating RN: Phillis Haggis Primary Care Provider: Vonita Moss Other Clinician: Referring Provider: Vonita Moss Treating Provider/Extender: STONE III, Brysen Shankman Weeks in Treatment: 54 Diagnosis Coding ICD-10 Codes Code Description L89.153 Pressure ulcer of sacral  region, stage 3 G20 Parkinson's disease L03.312 Cellulitis of back [any part except buttock] S81.811D Laceration without foreign body, right lower leg, subsequent encounter Facility Procedures CPT4 Code: 16109604 Description: 99213 - WOUND CARE VISIT-LEV 3 EST PT Modifier: Quantity: 1 Physician Procedures CPT4 Code: 5409811 Description: 99213 - WC PHYS LEVEL 3 - EST PT ICD-10 Diagnosis Description L89.153 Pressure ulcer of sacral region, stage 3 G20 Parkinson's disease L03.312 Cellulitis of back [any part except buttock] S81.811D Laceration without foreign body, right lower  leg, subseque Modifier: nt encounter Quantity: 1 Electronic Signature(s) Signed: 01/26/2018 5:03:20 PM By: Lenda Kelp PA-C Previous Signature: 01/26/2018 12:54:37 PM Version By: Alejandro Mulling Entered By: Lenda Kelp on 01/26/2018 16:30:08

## 2018-02-09 ENCOUNTER — Encounter: Payer: Medicare Other | Attending: Physician Assistant | Admitting: Physician Assistant

## 2018-02-09 DIAGNOSIS — Z888 Allergy status to other drugs, medicaments and biological substances status: Secondary | ICD-10-CM | POA: Insufficient documentation

## 2018-02-09 DIAGNOSIS — G20 Parkinson's disease: Secondary | ICD-10-CM | POA: Diagnosis not present

## 2018-02-09 DIAGNOSIS — F0280 Dementia in other diseases classified elsewhere without behavioral disturbance: Secondary | ICD-10-CM | POA: Insufficient documentation

## 2018-02-09 DIAGNOSIS — L89899 Pressure ulcer of other site, unspecified stage: Secondary | ICD-10-CM | POA: Diagnosis not present

## 2018-02-09 DIAGNOSIS — Z882 Allergy status to sulfonamides status: Secondary | ICD-10-CM | POA: Insufficient documentation

## 2018-02-09 DIAGNOSIS — L97822 Non-pressure chronic ulcer of other part of left lower leg with fat layer exposed: Secondary | ICD-10-CM | POA: Diagnosis not present

## 2018-02-09 DIAGNOSIS — L89153 Pressure ulcer of sacral region, stage 3: Secondary | ICD-10-CM | POA: Diagnosis not present

## 2018-02-09 DIAGNOSIS — S51002A Unspecified open wound of left elbow, initial encounter: Secondary | ICD-10-CM | POA: Insufficient documentation

## 2018-02-09 DIAGNOSIS — I10 Essential (primary) hypertension: Secondary | ICD-10-CM | POA: Insufficient documentation

## 2018-02-09 DIAGNOSIS — X58XXXA Exposure to other specified factors, initial encounter: Secondary | ICD-10-CM | POA: Diagnosis not present

## 2018-02-11 NOTE — Progress Notes (Signed)
[name], [name] ([id number]) Visit Report for 02/09/2018 Arrival Information Details Patient Name: [name], [name] Date of Service: 02/09/2018 10:15 AM Medical Record Number: [id number] Patient Account Number: [id number] Date of Birth/Sex: October 17, 1922 (84 y.o. F) Treating RN: Huel Coventry Primary Care Katriona Schmierer: Vonita Moss Other Clinician: Referring Nicholaos Schippers: Vonita Moss Treating Odeal Welden/Extender: Linwood Dibbles, HOYT Weeks in Treatment: 56 Visit Information History Since Last Visit All ordered tests and consults were completed: No Patient Arrived: Wheel Chair Added or deleted any medications: No Arrival Time: 10:53 Any new allergies or adverse reactions: No Accompanied By: daughter Had a fall or experienced change in No Transfer Assistance: Transfer activities of daily living that may affect Board risk of falls: Patient Identification Verified: Yes Signs or symptoms of abuse/neglect since last visito No Secondary Verification Process Completed: Yes Hospitalized since last visit: No Patient Requires Transmission-Based No Implantable device outside of the clinic excluding No Precautions: cellular tissue based products placed in the center Patient Has Alerts: No since last visit: Has Dressing in Place as Prescribed: Yes Pain Present Now: No Electronic Signature(s) Signed: 02/09/2018 5:51:43 PM By: Elliot Gurney, BSN, RN, CWS, Kim RN, BSN Entered By: Elliot Gurney, BSN, RN, CWS, Kim on 02/09/2018 10:55:46 [name] ([id number]) -------------------------------------------------------------------------------- Clinic Level of Care Assessment Details Patient Name: [name] Date of Service: 02/09/2018 10:15 AM Medical Record Number: [id number] Patient Account Number: [id number] Date of Birth/Sex: 1923/03/25 (84 y.o. F) Treating RN: Phillis Haggis Primary Care Eloni Darius: Vonita Moss Other Clinician: Referring Anaisabel Pederson: Vonita Moss Treating Aileana Hodder/Extender: Linwood Dibbles,  HOYT Weeks in Treatment: 56 Clinic Level of Care Assessment Items TOOL 4 Quantity Score X - Use when only an EandM is performed on FOLLOW-UP visit 1 0 ASSESSMENTS - Nursing Assessment / Reassessment X - Reassessment of Co-morbidities (includes updates in patient status) 1 10 X- 1 5 Reassessment of Adherence to Treatment Plan ASSESSMENTS - Wound and Skin Assessment / Reassessment X - Simple Wound Assessment / Reassessment - one wound 1 5  - 0 Complex Wound Assessment / Reassessment - multiple wounds  - 0 Dermatologic / Skin Assessment (not related to wound area) ASSESSMENTS - Focused Assessment  - Circumferential Edema Measurements - multi extremities 0  - 0 Nutritional Assessment / Counseling / Intervention  - 0 Lower Extremity Assessment (monofilament, tuning fork, pulses)  - 0 Peripheral Arterial Disease Assessment (using hand held doppler) ASSESSMENTS - Ostomy and/or Continence Assessment and Care  - Incontinence Assessment and Management 0  - 0 Ostomy Care Assessment and Management (repouching, etc.) PROCESS - Coordination of Care  - Simple Patient / Family Education for ongoing care 0 X- 1 20 Complex (extensive) Patient / Family Education for ongoing care X- 1 10 Staff obtains Chiropractor, Records, Test Results / Process Orders X- 1 10 Staff telephones HHA, Nursing Homes / Clarify orders / etc  - 0 Routine Transfer to another Facility (non-emergent condition)  - 0 Routine Hospital Admission (non-emergent condition)  - 0 New Admissions / Manufacturing engineer / Ordering NPWT, Apligraf, etc.  - 0 Emergency Hospital Admission (emergent condition) X- 1 10 Simple Discharge Coordination [name], [name] ([id number])  - 0 Complex (extensive) Discharge Coordination PROCESS - Special Needs  - Pediatric / Minor Patient Management 0  - 0 Isolation Patient Management  - 0 Hearing / Language / Visual special needs  - 0 Assessment of  Community assistance (transportation, D/C planning, etc.)  - 0 Additional assistance / Altered mentation  - 0 Support Surface(s) Assessment (bed, cushion, seat, etc.) INTERVENTIONS - Wound  Cleansing / Measurement X - Simple Wound Cleansing - one wound 1 5  - 0 Complex Wound Cleansing - multiple wounds X- 1 5 Wound Imaging (photographs - any number of wounds)  - 0 Wound Tracing (instead of photographs) X- 1 5 Simple Wound Measurement - one wound  - 0 Complex Wound Measurement - multiple wounds INTERVENTIONS - Wound Dressings X - Small Wound Dressing one or multiple wounds 1 10  - 0 Medium Wound Dressing one or multiple wounds  - 0 Large Wound Dressing one or multiple wounds X- 1 5 Application of Medications - topical  - 0 Application of Medications - injection INTERVENTIONS - Miscellaneous  - External ear exam 0  - 0 Specimen Collection (cultures, biopsies, blood, body fluids, etc.)  - 0 Specimen(s) / Culture(s) sent or taken to Lab for analysis  - 0 Patient Transfer (multiple staff / Nurse, adult / Similar devices)  - 0 Simple Staple / Suture removal (25 or less)  - 0 Complex Staple / Suture removal (26 or more)  - 0 Hypo / Hyperglycemic Management (close monitor of Blood Glucose)  - 0 Ankle / Brachial Index (ABI) - do not check if billed separately X- 1 5 Vital Signs [name], [name] ([id number]) Has the patient been seen at the hospital within the last three years: Yes Total Score: 105 Level Of Care: New/Established - Level 3 Electronic Signature(s) Signed: 02/10/2018 4:20:38 PM By: Alejandro Mulling Entered By: Alejandro Mulling on 02/09/2018 13:23:10 [name] ([id number]) -------------------------------------------------------------------------------- Encounter Discharge Information Details Patient Name: [name] Date of Service: 02/09/2018 10:15 AM Medical Record Number: [id number] Patient Account Number:  [id number] Date of Birth/Sex: 06/23/1923 (84 y.o. F) Treating RN: Renne Crigler Primary Care Tangy Drozdowski: Vonita Moss Other Clinician: Referring Kyleigh Nannini: Vonita Moss Treating Maximiano Lott/Extender: Linwood Dibbles, HOYT Weeks in Treatment: 54 Encounter Discharge Information Items Discharge Condition: Stable Ambulatory Status: Wheelchair Discharge Destination: Home Transportation: Private Auto Accompanied By: daughter and caregiver Schedule Follow-up Appointment: Yes Clinical Summary of Care: Electronic Signature(s) Signed: 02/09/2018 3:20:38 PM By: Renne Crigler Entered By: Renne Crigler on 02/09/2018 11:49:31 [name] ([id number]) -------------------------------------------------------------------------------- Multi Wound Chart Details Patient Name: [name] Date of Service: 02/09/2018 10:15 AM Medical Record Number: [id number] Patient Account Number: [id number] Date of Birth/Sex: 1922/10/08 (84 y.o. F) Treating RN: Phillis Haggis Primary Care Alic Hilburn: Vonita Moss Other Clinician: Referring Jamilla Galli: Vonita Moss Treating Lesleigh Hughson/Extender: Linwood Dibbles, HOYT Weeks in Treatment: 56 Vital Signs Height(in): 60 Pulse(bpm): 103 Weight(lbs): 90 Blood Pressure(mmHg): 129/98 Body Mass Index(BMI): 18 Temperature(F): 98 Respiratory Rate 18 (breaths/min): Photos: [1:No Photos] [N/A:N/A] Wound Location: [1:Coccyx - Midline] [N/A:N/A] Wounding Event: [1:Pressure Injury] [N/A:N/A] Primary Etiology: [1:Pressure Ulcer] [N/A:N/A] Comorbid History: [1:Anemia, Hypertension, Dementia] [N/A:N/A] Date Acquired: [1:12/01/2016] [N/A:N/A] Weeks of Treatment: [1:56] [N/A:N/A] Wound Status: [1:Open] [N/A:N/A] Measurements L x W x D [1:4.5x2.3x0.4] [N/A:N/A] (cm) Area (cm) : [1:8.129] [N/A:N/A] Volume (cm) : [1:3.252] [N/A:N/A] % Reduction in Area: [1:-95.60%] [N/A:N/A] % Reduction in Volume: [1:-95.70%] [N/A:N/A] Starting Position 1 [1:6] (o'clock): Ending  Position 1 [1:9] (o'clock): Maximum Distance 1 (cm): [1:1.2] Undermining: [1:Yes] [N/A:N/A] Classification: [1:Category/Stage IV] [N/A:N/A] Exudate Amount: [1:Large] [N/A:N/A] Exudate Type: [1:Serosanguineous] [N/A:N/A] Exudate Color: [1:red, brown] [N/A:N/A] Foul Odor After Cleansing: [1:Yes] [N/A:N/A] Odor Anticipated Due to [1:No] [N/A:N/A] Product Use: Wound Margin: [1:Flat and Intact] [N/A:N/A] Granulation Amount: [1:Large (67-100%)] [N/A:N/A] Granulation Quality: [1:Red, Hyper-granulation] [N/A:N/A] Necrotic Amount: [1:Small (1-33%)] [N/A:N/A] Exposed Structures: [1:Fat Layer (Subcutaneous Tissue) Exposed: Yes Muscle: Yes Fascia: No Tendon: No] [N/A:N/A] Joint: No Bone: No Epithelialization:  None N/A N/A Periwound Skin Texture: Induration: Yes N/A N/A Excoriation: No Callus: No Crepitus: No Rash: No Scarring: No Periwound Skin Moisture: Maceration: Yes N/A N/A Dry/Scaly: No Periwound Skin Color: Erythema: Yes N/A N/A Atrophie Blanche: No Cyanosis: No Ecchymosis: No Hemosiderin Staining: No Mottled: No Pallor: No Rubor: No Erythema Location: Circumferential N/A N/A Temperature: No Abnormality N/A N/A Tenderness on Palpation: Yes N/A N/A Wound Preparation: Ulcer Cleansing: N/A N/A Rinsed/Irrigated with Saline Topical Anesthetic Applied: Other: lidocaine 4% Treatment Notes Electronic Signature(s) Signed: 02/10/2018 4:20:38 PM By: Alejandro Mulling Entered By: Alejandro Mulling on 02/09/2018 11:29:41 [name] ([id number]) -------------------------------------------------------------------------------- Multi-Disciplinary Care Plan Details Patient Name: [name] Date of Service: 02/09/2018 10:15 AM Medical Record Number: [id number] Patient Account Number: [id number] Date of Birth/Sex: Dec 22, 1922 (84 y.o. F) Treating RN: Phillis Haggis Primary Care Camellia Popescu: Vonita Moss Other Clinician: Referring Jayson Waterhouse: Vonita Moss Treating  Kellina Dreese/Extender: Linwood Dibbles, HOYT Weeks in Treatment: 26 Active Inactive ` Abuse / Safety / Falls / Self Care Management Nursing Diagnoses: Impaired physical mobility Potential for falls Goals: Patient will remain injury free Date Initiated: 01/07/2017 Target Resolution Date: 04/03/2017 Goal Status: Active Interventions: Assess fall risk on admission and as needed Notes: ` Nutrition Nursing Diagnoses: Potential for alteratiion in Nutrition/Potential for imbalanced nutrition Goals: Patient/caregiver agrees to and verbalizes understanding of need to use nutritional supplements and/or vitamins as prescribed Date Initiated: 01/07/2017 Target Resolution Date: 04/03/2017 Goal Status: Active Interventions: Assess patient nutrition upon admission and as needed per policy Notes: ` Orientation to the Wound Care Program Nursing Diagnoses: Knowledge deficit related to the wound healing center program Goals: Patient/caregiver will verbalize understanding of the Wound Healing Center Program Date Initiated: 01/07/2017 Target Resolution Date: 04/03/2017 Goal Status: Active [name], [name] ([id number]) Interventions: Provide education on orientation to the wound center Notes: ` Pressure Nursing Diagnoses: Knowledge deficit related to causes and risk factors for pressure ulcer development Goals: Patient will remain free from development of additional pressure ulcers Date Initiated: 01/07/2017 Target Resolution Date: 04/03/2017 Goal Status: Active Interventions: Assess potential for pressure ulcer upon admission and as needed Notes: ` Wound/Skin Impairment Nursing Diagnoses: Impaired tissue integrity Goals: Patient/caregiver will verbalize understanding of skin care regimen Date Initiated: 01/07/2017 Target Resolution Date: 04/03/2017 Goal Status: Active Ulcer/skin breakdown will have a volume reduction of 30% by week 4 Date Initiated: 01/07/2017 Target Resolution Date: 04/03/2017 Goal  Status: Active Ulcer/skin breakdown will have a volume reduction of 50% by week 8 Date Initiated: 01/07/2017 Target Resolution Date: 04/03/2017 Goal Status: Active Ulcer/skin breakdown will have a volume reduction of 80% by week 12 Date Initiated: 01/07/2017 Target Resolution Date: 04/03/2017 Goal Status: Active Ulcer/skin breakdown will heal within 14 weeks Date Initiated: 01/07/2017 Target Resolution Date: 04/03/2017 Goal Status: Active Interventions: Assess patient/caregiver ability to obtain necessary supplies Assess patient/caregiver ability to perform ulcer/skin care regimen upon admission and as needed Assess ulceration(s) every visit Notes: Electronic Signature(s) [name], [name] ([id number]) Signed: 02/10/2018 4:20:38 PM By: Alejandro Mulling Entered By: Alejandro Mulling on 02/09/2018 11:29:25 [name] ([id number]) -------------------------------------------------------------------------------- Pain Assessment Details Patient Name: [name] Date of Service: 02/09/2018 10:15 AM Medical Record Number: [id number] Patient Account Number: [id number] Date of Birth/Sex: Jan 27, 1923 (84 y.o. F) Treating RN: Huel Coventry Primary Care Wolfe Camarena: Vonita Moss Other Clinician: Referring Kassidy Frankson: Vonita Moss Treating Niharika Savino/Extender: Linwood Dibbles, HOYT Weeks in Treatment: 47 Active Problems Location of Pain Severity and Description of Pain Patient Has Paino Patient Unable to Respond Site Locations Pain Management and Medication Current Pain  Management: Electronic Signature(s) Signed: 02/09/2018 5:51:43 PM By: Elliot Gurney, BSN, RN, CWS, Kim RN, BSN Entered By: Elliot Gurney, BSN, RN, CWS, Kim on 02/09/2018 11:07:19 [name] ([id number]) -------------------------------------------------------------------------------- Patient/Caregiver Education Details Patient Name: [name] Date of Service: 02/09/2018 10:15 AM Medical Record Number: [id number] Patient Account Number:  [id number] Date of Birth/Gender: January 20, 1923 (84 y.o. F) Treating RN: Renne Crigler Primary Care Physician: Vonita Moss Other Clinician: Referring Physician: Vonita Moss Treating Physician/Extender: Skeet Simmer in Treatment: 16 Education Assessment Education Provided To: Patient Education Topics Provided Wound Debridement: Handouts: Wound Debridement Methods: Explain/Verbal Wound/Skin Impairment: Handouts: Caring for Your Ulcer Methods: Explain/Verbal Responses: State content correctly Electronic Signature(s) Signed: 02/09/2018 3:20:38 PM By: Renne Crigler Entered By: Renne Crigler on 02/09/2018 11:49:51 [name] ([id number]) -------------------------------------------------------------------------------- Wound Assessment Details Patient Name: [name] Date of Service: 02/09/2018 10:15 AM Medical Record Number: [id number] Patient Account Number: [id number] Date of Birth/Sex: [birth year] (84 y.o. F) Treating RN: Huel Coventry Primary Care Iliyah [name]: Vonita Moss Other Clinician: Referring Makenleigh Crownover: Vonita Moss Treating Lurie Mullane/Extender: Linwood Dibbles, HOYT Weeks in Treatment: 56 Wound Status Wound Number: 1 Primary Etiology: Pressure Ulcer Wound Location: Coccyx - Midline Wound Status: Open Wounding Event: Pressure Injury Comorbid History: Anemia, Hypertension, Dementia Date Acquired: 12/01/2016 Weeks Of Treatment: 56 Clustered Wound: No Photos Photo Uploaded By: Elliot Gurney, BSN, RN, CWS, Kim on 02/09/2018 18:00:25 Wound Measurements Length: (cm) 4.5 % Reducti Width: (cm) 2.3 % Reducti Depth: (cm) 0.4 Epithelia Area: (cm) 8.129 Tunnelin Volume: (cm) 3.252 Undermin Starti Ending Maximu on in Area: -95.6% on in Volume: -95.7% lization: None g: No ing: Yes ng Position (o'clock): 6 Position (o'clock): 9 m Distance: (cm) 1.2 Wound Description Classification: Category/Stage IV Foul Odor Wound Margin: Flat and Intact Due to  Pr Exudate Amount: Large Slough/Fi Exudate Type: Serosanguineous Exudate Color: red, brown After Cleansing: Yes oduct Use: No brino Yes Wound Bed Granulation Amount: Large (67-100%) Exposed Structure Granulation Quality: Red, Hyper-granulation Fascia Exposed: No Necrotic Amount: Small (1-33%) Fat Layer (Subcutaneous Tissue) Exposed: Yes Necrotic Quality: Adherent Slough Tendon Exposed: No Muscle Exposed: Yes [name], [name]. ([id number]) Necrosis of Muscle: No Joint Exposed: No Bone Exposed: No Periwound Skin Texture Texture Color No Abnormalities Noted: No No Abnormalities Noted: No Callus: No Atrophie Blanche: No Crepitus: No Cyanosis: No Excoriation: No Ecchymosis: No Induration: Yes Erythema: Yes Rash: No Erythema Location: Circumferential Scarring: No Hemosiderin Staining: No Mottled: No Moisture Pallor: No No Abnormalities Noted: No Rubor: No Dry / Scaly: No Maceration: Yes Temperature / Pain Temperature: No Abnormality Tenderness on Palpation: Yes Wound Preparation Ulcer Cleansing: Rinsed/Irrigated with Saline Topical Anesthetic Applied: Other: lidocaine 4%, Treatment Notes Wound #1 (Midline Coccyx) 2. Anesthetic Topical Lidocaine 4% cream to wound bed prior to debridement 3. Peri-wound Care: Antifungal cream Barrier cream Skin Prep Other peri-wound care (specify in notes) 4. Dressing Applied: Prisma Ag 5. Secondary Dressing Applied ABD Pad Dry Gauze Electronic Signature(s) Signed: 02/09/2018 5:51:43 PM By: Elliot Gurney, BSN, RN, CWS, Kim RN, BSN Entered By: Elliot Gurney, BSN, RN, CWS, Kim on 02/09/2018 11:01:51 [name] ([id number]) -------------------------------------------------------------------------------- Vitals Details Patient Name: [name] Date of Service: 02/09/2018 10:15 AM Medical Record Number: [id number] Patient Account Number: [id number] Date of Birth/Sex: 07-15-1923 (84 y.o. F) Treating RN: Huel Coventry Primary Care  Lillie Portner: Vonita Moss Other Clinician: Referring Kortney Schoenfelder: Vonita Moss Treating Hayle Parisi/Extender: Linwood Dibbles, HOYT Weeks in Treatment: 56 Vital Signs Time Taken: 10:51 Temperature (F): 98 Height (in): 60 Pulse (bpm): 103 Weight (lbs): 90 Respiratory Rate (breaths/min): 18  Body Mass Index (BMI): 17.6 Blood Pressure (mmHg): 129/98 Reference Range: 80 - 120 mg / dl Electronic Signature(s) Signed: 02/09/2018 5:51:43 PM By: Elliot Gurney, BSN, RN, CWS, Kim RN, BSN Entered By: Elliot Gurney, BSN, RN, CWS, Kim on 02/09/2018 10:52:58

## 2018-02-11 NOTE — Progress Notes (Signed)
DELISA, FINCK ([id number]) Visit Report for 02/09/2018 Chief Complaint Document Details Patient Name: [name], [name] Date of Service: 02/09/2018 10:15 AM Medical Record Number: [id number] Patient Account Number: [id number] Date of Birth/Sex: 09-Jan-1923 (84 y.o. F) Treating RN: Phillis Haggis Primary Care Provider: Vonita Moss Other Clinician: Referring Provider: Vonita Moss Treating Provider/Extender: Linwood Dibbles, HOYT Weeks in Treatment: 12 Information Obtained from: Patient Chief Complaint patient is here for follow up evaluation of a sacral pressure ulcer Electronic Signature(s) Signed: 02/10/2018 12:50:15 AM By: Lenda Kelp PA-C Entered By: Lenda Kelp on 02/09/2018 10:56:48 [name] ([id number]) -------------------------------------------------------------------------------- HPI Details Patient Name: [name] Date of Service: 02/09/2018 10:15 AM Medical Record Number: [id number] Patient Account Number: [id number] Date of Birth/Sex: 07-25-1923 (84 y.o. F) Treating RN: Phillis Haggis Primary Care Provider: Vonita Moss Other Clinician: Referring Provider: Vonita Moss Treating Provider/Extender: Linwood Dibbles, HOYT Weeks in Treatment: 61 History of Present Illness HPI Description: 01/07/17 this is a 84 year old woman admitted to the clinic today for review of a pressure ulcer on her lower sacrum. She is referred from her primary physician's office after being seen on 3/22 with a 3 cm pressure area. Her daughter and caretaker accompanied her today state that the area first became obvious about a month ago and his since deteriorated. They have recently got [name] a home health involved and have been using [name] to the wound. They have ordered a pressure relief surface for her mattress. They are turning her religiously. They state that she eats well and they've been forcing fluids on her. She is on a multivitamin. Looking through [facility] point  last albumin I see was 4.4 on 10/28. The patient has advanced parkinsonism which looks superficially like advanced Parkinson's disease although her daughter tells me she did not ever respond to Sinemet therefore this may have another pathology with signs of parkinsonism. However I think this is largely a mute point currently. She also has dementia and is nonambulatory. Since this started they have been keeping her in bed and turning her religiously every 2 hours. She lives at home in [place] with her husband with 24/7 care giving 01/13/17 santyl change qd. Still will require further debridement. continue santyl. 01/20/17; patient's wound actually looks some better less adherent necrotic surface. There is actually visible granulation. We're using [name] 01/27/17; better-looking surface but still a lot of necrotic tissue on the base of this wound. The periwound erythema is better than last week we are still using [name]. Her daughter tells [place] that she is still having trouble with the pressure-relief mattress through medical modalities 02/03/18; I had the patient scheduled for a two week followup however her daughter brought her in early concerned for discoloration on 2 areas of the wound circumference. We have bee using santyl 02/10/17;Better looking surface to the wound. Rim appears better suggesting better offloading. Using santyl 02/24/17; change to Silver Collegen last time. Wound appears better. 03/10/17; still using silver collagen religious offloading. Intake is satisfactory per her daughter. Dimension slightly better 03/12/2017 -- Dr. Jannetta Quint patient who had been seen 2 days ago and was doing fairly well. The patient is brought in by her daughter who noticed a new wound just above the previous wound on her sacral area and going on more to the left lateral side. She was very concerned and we asked her to get in for an opinion. 03/17/17; above is noted. The patient has developed a progressive area  to the left of her original wound. This  seems to this started with a ring of red skin with a more pale interior almost looking fungal. There was a rim of blister through part of the area although this did not look like zoster. They have been applying triamcinolone that was prescribed last week by Dr. Meyer Russel and the area has a fold to a linear band area which is confluent, erythematous and with obvious epidermal swelling but there is no overt tenderness or crepitus. She has lost some surface epithelium closer to the wound surface itself and now has a more superficial wound in this area and the extending erythema goes towards the left buttock. This is well demarcated between involved in normal skin but once again does not appear to be at all tender. If there is a contact issue here I cannot get the history out of the daughter or the caregiver that are with her. 03/24/17; the patient arrives today with the wound slightly worse slightly more drainage. The bandlike area of erythema that I treated as a possible pineal infection has improved somewhat although proximally is still has confluent erythema without overt tenderness. We have been using silver alginate since the most recent deterioration. To the bandlike degree of erythema we have been using Lotrisone cream 03/31/17; patient arrives today with the wound slightly larger, necrotic surface and surrounding erythema. The bandlike area of erythema that I treated as a possible pineal infection is less swollen but still present I've been using Lotrisone cream on that largely related to the presence of a tinea looking infection when this was first seen. We've been using silver alginate. X-ray that I ordered last week showed no acute bony abnormalities mild fecal impaction. Lab work showed a comprehensive metabolic panel that was normal including an albumin of 3.8. White count was 9.5 hemoglobin 12.3 differential count normal. C-reactive protein was less than  1 and sedimentation rate and 17. The latter 2 values does not support an ongoing bacterial infection. 04/14/17; patient arrives after a 2 week hiatus. Her wound is not in good condition. Although the base of the wound looks STEFFANIE, MINGLE. ([id number]) stable she still has an erythematous area that was apparently blistered over the weekend. This again points to the left. As our intake nurse pointed out today this is in the area where the tissue folds together and we may need to prevent try to prevent this. Lab work and x-ray that I did to 3 weeks ago were unremarkable including her albumin nevertheless she is an extremely frail condition physically. We have have been using [name] 04/22/17; I changed her to silver alginate because of the surrounding maceration and moisture last week. The daughter did not like the way the wound looked in the middle of the week and changed her back to [place]. They're putting gauze on top of this. Thinks still using Lotrisone. 05/06/17 on evaluation today patient sacral wound appears to be doing okay and does not seem to be any worse. She is having no significant pain during evaluation today the secondary to mental status she is unable to rate or describe whether she had any pain she was not however flinching. Her daughter states that the wound does appear to be looking better to her. Still we are having difficulty with the skinfold that seems to be closing in on itself at this point. All in all I feel like she is making some good progress in the [name] seems to be the official for her. They do tell me that a refill if  we are gonna continue that today. No fevers, chills, nausea, or vomiting noted at this time. 05/13/17 presents today for evaluation concerning her ongoing sacral pressure ulcer. Unfortunately she also has an area of deep tissue injury in the right Ischial region which is starting to show up. The sacral wound also continues to show signs of necrotic tissue  overlying and has declined. Overall we really have not seen a significant improvement in the past several months in regard to the sacral wound and now patient is starting to develop a new wound in the right Ischial region. Obviously this is not trending in the direction that we want to see. No fevers, chills, nausea, or vomiting noted at this time. 05/19/17; I have not seen this wound and almost a month however there is nothing really positive to say about it. Necrotic tissue over the surface which superiorly I think abuts on her sacrum. She has surrounding erythema. I would be surprised if there is not underlying osteomyelitis or soft tissue infection. 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 She apparently eats well per description although I wonder about this looking at her. Lab work I did probably 4 weeks ago however was really quite normal including a serum albumin 05/26/17; culture I did last week grew Escherichia coli and methicillin sensitive staph aureus which should've been well covered by the Augmentin and ciprofloxacin. Indeed the erythema around the wound in the bed of the wound looks somewhat better. Her intake is still satisfactory. They now have a near fluidized bed 06/02/17; they completed the antibiotics last Friday. Using collagen. Daughter still reports eating and drinking well. There is less visualized erythema around the wound. 06/16/17; large pressure ulcer over her lower sacrum and coccyx. Using [name] to the wound bed. 06/30/17; certainly no change in dimensions of this large stage III wound over her sacrum and coccyx. They've been using [name]. There is no exposed bone. She has a candidal/tinea area in the right inguinal area. Other than that her daughter relates that she is eating and drinking well there changing her positioning to make sure the areas offloaded 07/14/17; no major change in the dimensions of this large stage III wound. Initially a smaller wound that  became secondarily infected causing significant tissue breakdown although it is been stable in the last several weeks. Tunneling superiorly at roughly 1:00 no change here either. There is no bone palpable. Both the patient's daughter and caretaker states that she eats well. I have not rechecked her blood work 07/27/17; patient arrives in clinic today and generally a deteriorated looking state. Mild fever with axillary temperature of 100.4. Daughter reports she has not been eating and drinking well since yesterday. She looks more pale and thin and less responsive. We have been using silver collagen to her wound 08/11/17; since the last time the patient was here things have gone better. Her fever went down and she started eating and drinking again. Culture I did of the wound showed methicillin sensitive staph aureus, Morganella and enterococcus. I only treated her with Keflex which would've not covered the Morganella and enterococcus however the purulent area on the 2:00 side of her wound is a lot better and the bandlike erythema that concern me also was resolved. I'd called the daughter last week to confirm that she was a lot better. She finished the Keflex last Thursday she also suffered a skin tear this morning perhaps while putting on her incontinence brief period is on the lateral aspect of  her right leg. Clean wound with the surface epithelium not viable. 08/25/17; last week the patient was noted to have erythema around the wound margin and a slight fever which the patient's daughter says was 50. Our office was contacted by home health however we did not have a space to work the patient in that she went to see her primary physician Dr. Maurice March. She was not febrile during this visit on 08/21/17 there was erythema around the wound similar to last occasion. Dr. Maurice March in reference to my culture from 07/28/17 and put her on doxycycline capsules which they're opening twice a day for 10 days. 09/17/17;  patient arrives today with the wound bed looking fairly well granulated. There is undermining from 4 to 6:00 although this seems to of contracted slightly. She does not have obvious infection although the daughter states there was some darkening of the wound circumference that is not evident today. They state she is eating well. They are concerned about oral thrush 09/30/16; very fibrin looking granulated wound bed. Her undermining from 4 to 6:00 is about the same but also appears to be well granulated. She has rolled edges of senescent tissue from roughly 7 to 12:00. There is no evidence of infection MAZY, CULTON ([id number]) 10/13/17 on evaluation today patient appears to be doing fairly well all things considered in regard to her sacral wound. There's really not a lot of significant change or improvement she does have some evidence of contusion and deep tissue injury around the left border of the wound that patient's daughter did inquire about today. Nonetheless overall the wound appears to be doing about the same in my opinion. There is no significant indication of infection there also is no significant slough noted at this point. 10/27/17 She is here in follow up evaluation of a sacral ulcer. She is accompanied by her daughter and caregiver. There is red granulation tissue throughout, persistent discoloration to left border; this appears consistent with deep tissue injury. There are multiple areas covered in foam borders, tegaderm, etc that are "preventative" with "no wounds". We will continue with prisma and continue with two week follow ups 11/17/17 on evaluation today patient appears to be doing decently well in regard to her wound at this point. She continues to have a sacral wound ulcer. We see her roughly every two weeks. In the last week she was actually in the hospital due to what was diagnosed as sepsis although no organisms were ever identified in the actual blood cultures. The  hospital really was not sure that the wound was the cause of the infection but they really did not figure out anything else that would be a causative organism she did have a CT scan and that revealed no evidence of pneumonia there was also no evidence of urinary tract infection. Again it very well could have been the wound called in those although wound appears to be doing fairly well at this point. 12/15/17 on evaluation today patient actually appears to be doing about the same in regard to her sacral ulcer. The one thing different is that she does seem to have a rash where her right arm is contracted and being held to the thorax. The area underlying both on the ventral side of the arm as well is the thorax where it comes in contact shows evidence of a rash which appears to be fungal in nature. There does not appear to be any evidence of infection lies at this point. There does not  appear to be a rash consistent with shingles which was also of concern initially. No fevers, chills, nausea, or vomiting noted at this time. 12/29/17 on evaluation today patient appears to be doing a little better in regard to her sacral wound although the one changes the 12 o'clock location of the wound seems to have attached at one point which no longer allows this to pull back. This has caused an area of undermining that seems to be attaching as well in the 12 o'clock location. I do think that this is something that can be managed and is not necessarily a bad thing. Nonetheless she does seem to have some pain with exploration of this region of undermining. She continues to have an area under her right arm on the chest wall of erythema although this is a little better especially after the home health nurse is actually got in order for Diflucan times one for the patient. She is a new area on the left wrist that looks like because of the contraction her nail may have pushed in on her wrist area causing a slight cut which  subsequently became infected. She has some honey crusted drainage noted. 01/12/18 on evaluation today patient is seen with her caregiver in the office at this point. The patient's daughter who normally accompanies her was not present for the evaluation. With that being said in regard to her sacral wound things actually appear to be doing a little bit better with more adherence at the 12 o'clock location of the wound to the bed. There still does appear to be an area of undermining/tunneling at the 12 o'clock location very specifically which still does have some depth to it. Fortunately there does not appear to be any evidence of significant infection. Otherwise her rash under the right breast region seems to be doing better and in general the sores on her bilateral lower extremities are also improving we have been using Xeroform gauze of these locations. Her wrist ulcer on the left has actually healed. 01/26/18 on evaluation today patient's wounds of her lower extremities actually appear to be doing better and overall this is good news currently. In fact these are all healed. It's mainly her sacral wound which is still open although it does show some signs of filling in in regard to the areas of undermining at this time. With all that being said I do still think that is gonna be some time before this wound will fully heal and I do believe that the hyper granular tissue may need to be addressed at some point although right now I'm more interested in getting this to fill in before addressing the hyper granulation. 02/09/18 on evaluation today patient appears to be doing well in regard to her sacral pressure ulcer as well is her bilateral lower extremity ulcers which seem to be healed at this point. The sacral wound is the only one remaining and she does not seem to have any evidence of infection or pain this is doing excellent in my pinion. Electronic Signature(s) Signed: 02/10/2018 12:50:15 AM By: Lenda Kelp PA-C Entered By: Lenda Kelp on 02/09/2018 23:52:40 Mesquita, Myles Gip ([id number]) [name] ([id number]) -------------------------------------------------------------------------------- Physical Exam Details Patient Name: [name] Date of Service: 02/09/2018 10:15 AM Medical Record Number: [id number] Patient Account Number: [id number] Date of Birth/Sex: 30-Jul-1923 (84 y.o. F) Treating RN: Phillis Haggis Primary Care Provider: Vonita Moss Other Clinician: Referring Provider: Vonita Moss Treating Provider/Extender: STONE III, HOYT Weeks in Treatment:  56 Constitutional Thin and well-hydrated in no acute distress. Respiratory normal breathing without difficulty. clear to auscultation bilaterally. Cardiovascular regular rate and rhythm with normal S1, S2. Psychiatric Patient is not able to cooperate in decision making regarding care. Patient has dementia. patient is confused. Notes Patient's wound shows good granulation at this point there does not appear to be significant undermining although there is a little bit of both 12 o'clock as well is roughly 6 o'clock location. Overall this is progressing nicely. Electronic Signature(s) Signed: 02/10/2018 12:50:15 AM By: Lenda Kelp PA-C Entered By: Lenda Kelp on 02/09/2018 23:53:27 [name] ([id number]) -------------------------------------------------------------------------------- Physician Orders Details Patient Name: [name] Date of Service: 02/09/2018 10:15 AM Medical Record Number: [id number] Patient Account Number: [id number] Date of Birth/Sex: 03-06-1923 (84 y.o. F) Treating RN: Phillis Haggis Primary Care Provider: Vonita Moss Other Clinician: Referring Provider: Vonita Moss Treating Provider/Extender: Linwood Dibbles, HOYT Weeks in Treatment: 93 Verbal / Phone Orders: Yes Clinician: Ashok Cordia, Debi Read Back and Verified: Yes Diagnosis Coding ICD-10 Coding Code  Description L89.153 Pressure ulcer of sacral region, stage 3 G20 Parkinson's disease L03.312 Cellulitis of back [any part except buttock] S81.811D Laceration without foreign body, right lower leg, subsequent encounter Wound Cleansing Wound #1 Midline Coccyx o Clean wound with Normal Saline. o May Shower, gently pat wound dry prior to applying new dressing. Anesthetic (add to Medication List) Wound #1 Midline Coccyx o Topical Lidocaine 4% cream applied to wound bed prior to debridement (In Clinic Only). Skin Barriers/Peri-Wound Care Wound #1 Midline Coccyx o Skin Prep - use only where the allevyn sticks not on reddened areas o Antifungal powder-Nystatin - around peri-wound on coccyx (do not get in wound), under right breast and right arm, right abdomen Primary Wound Dressing Wound #1 Midline Coccyx o Silver Collagen - prisma ag and pack into undermining as well slightly moisten with saline Secondary Dressing Wound #1 Midline Coccyx o Dry Gauze o Boardered Foam Dressing - HHRN PLEASE ORDER ALLEVYN LIFE BORDERED FOAM FOR PATIENT (OFF BRANDS IRRITATE HER SKIN) Dressing Change Frequency Wound #1 Midline Coccyx o Change dressing every day. Follow-up Appointments o Return Appointment in 2 weeks. Off-Loading DELISSA, SILBA ([id number]) Wound #1 Midline Coccyx o Roho cushion for wheelchair o Mattress - fluidized air mattress o Turn and reposition every 2 hours Additional Orders / Instructions Wound #1 Midline Coccyx o Increase protein intake. - please add protein supplements to patients diet o Other: - please add vitamin A, vitamin C and zinc supplements to patients diet Home Health Wound #1 Midline Coccyx o Continue Home Health Visits - [facility] o Home Health Nurse may visit PRN to address patientos wound care needs. o FACE TO FACE ENCOUNTER: MEDICARE and MEDICAID PATIENTS: I certify that this patient is under my care and that I had a  face-to-face encounter that meets the physician face-to-face encounter requirements with this patient on this date. The encounter with the patient was in whole or in part for the following MEDICAL CONDITION: (primary reason for Home Healthcare) MEDICAL NECESSITY: I certify, that based on my findings, NURSING services are a medically necessary home health service. HOME BOUND STATUS: I certify that my clinical findings support that this patient is homebound (i.e., Due to illness or injury, pt requires aid of supportive devices such as crutches, cane, wheelchairs, walkers, the use of special transportation or the assistance of another person to leave their place of residence. There is a normal inability to leave the home and doing so requires  considerable and taxing effort. Other absences are for medical reasons / religious services and are infrequent or of short duration when for other reasons). o If current dressing causes regression in wound condition, may D/C ordered dressing product/s and apply Normal Saline Moist Dressing daily until next Wound Healing Center / Other MD appointment. Notify Wound Healing Center of regression in wound condition at [phone]. o Please direct any NON-WOUND related issues/requests for orders to patient's Primary Care Physician - Dr Vonita Moss Patient Medications Allergies: Phenergan, Sulfa (Sulfonamide Antibiotics) Notifications Medication Indication Start End lidocaine DOSE 1 - topical 4 % cream - 1 cream topical Electronic Signature(s) Signed: 02/10/2018 12:50:15 AM By: Lenda Kelp PA-C Signed: 02/10/2018 4:20:38 PM By: Alejandro Mulling Entered By: Alejandro Mulling on 02/09/2018 11:33:18 [name] ([id number]) -------------------------------------------------------------------------------- Prescription 02/09/2018 Patient Name: [name] Provider: Lenda Kelp PA-C Date of Birth: 21-Aug-1923 NPI#: [id number] Sex: F DEA#:  [id number] Phone #: [phone] License #: Patient Address: [facility] [address] [facility] [place], [place] [address] [address], [address] [place], [place] [address] [phone] Allergies Phenergan Sulfa (Sulfonamide Antibiotics) Medication Medication: Route: Strength: Form: lidocaine 4 % topical cream topical 4% cream Class: TOPICAL LOCAL ANESTHETICS Dose: Frequency / Time: Indication: 1 1 cream topical Number of Refills: Number of Units: 0 Generic Substitution: Start Date: End Date: One Time Use: Substitution Permitted No Note to Pharmacy: Signature(s): Date(s): Electronic Signature(s) Signed: 02/10/2018 12:50:15 AM By: Lenda Kelp PA-C Signed: 02/10/2018 4:20:38 PM By: Alejandro Mulling Entered By: Alejandro Mulling on 02/09/2018 11:33:20 [name] ([id number]) [name] ([id number]) --------------------------------------------------------------------------------  Problem List Details Patient Name: [name] Date of Service: 02/09/2018 10:15 AM Medical Record Number: [id number] Patient Account Number: [id number] Date of Birth/Sex: 1922-11-16 (84 y.o. F) Treating RN: Phillis Haggis Primary Care Provider: Vonita Moss Other Clinician: Referring Provider: Vonita Moss Treating Provider/Extender: Linwood Dibbles, HOYT Weeks in Treatment: 36 Active Problems ICD-10 Impacting Encounter Code Description Active Date Wound Healing Diagnosis L89.153 Pressure ulcer of sacral region, stage 3 01/07/2017 Yes G20 Parkinson's disease 01/07/2017 Yes L03.312 Cellulitis of back [any part except buttock] 07/28/2017 Yes S81.811D Laceration without foreign body, right lower leg, subsequent 08/11/2017 Yes encounter Inactive Problems Resolved Problems Electronic Signature(s) Signed: 02/10/2018 12:50:15 AM By: Lenda Kelp PA-C Entered By: Lenda Kelp on 02/09/2018 10:56:42 [name] ([id number]) -------------------------------------------------------------------------------- Progress Note Details Patient Name: [name] Date of Service: 02/09/2018 10:15 AM Medical Record Number: [id number] Patient Account Number: [id number] Date of Birth/Sex: [birth year] (84 y.o. F) Treating RN: Phillis Haggis Primary Care Provider: Vonita Moss Other Clinician: Referring Provider: Vonita Moss Treating Provider/Extender: Linwood Dibbles, HOYT Weeks in Treatment: 70 Subjective Chief Complaint Information obtained from Patient patient is here for follow up evaluation of a sacral pressure ulcer History of Present Illness (HPI) 01/07/17 this is a 84 year old woman admitted to the clinic today for review of a pressure ulcer on her lower sacrum. She is referred from her primary physician's office after being seen on 3/22 with a 3 cm pressure area. Her daughter and caretaker accompanied her today state that the area first became obvious about a month ago and his since deteriorated. They have recently got [name] home health involved and have been using [name] to the wound. They have ordered a pressure relief surface for her mattress. They are turning her religiously. They state that she eats well and they've been forcing fluids on her. She is  on a multivitamin. Looking through [facility] point last albumin I see was 4.4 on 10/28. The patient has advanced parkinsonism which looks superficially like advanced Parkinson's disease although her daughter tells me she did not ever respond to Sinemet therefore this may have another pathology with signs of parkinsonism. However I think this is largely a mute point currently. She also has dementia and is nonambulatory. Since this started they have been keeping her in bed and turning her religiously every 2 hours. She lives at home in [place] with her husband with 24/7 care giving 01/13/17 santyl change qd. Still will require further  debridement. continue santyl. 01/20/17; patient's wound actually looks some better less adherent necrotic surface. There is actually visible granulation. We're using [name] 01/27/17; better-looking surface but still a lot of necrotic tissue on the base of this wound. The periwound erythema is better than last week we are still using [name]. Her daughter tells [place] that she is still having trouble with the pressure-relief mattress through medical modalities 02/03/18; I had the patient scheduled for a two week followup however her daughter brought her in early concerned for discoloration on 2 areas of the wound circumference. We have bee using santyl 02/10/17;Better looking surface to the wound. Rim appears better suggesting better offloading. Using santyl 02/24/17; change to Silver Collegen last time. Wound appears better. 03/10/17; still using silver collagen religious offloading. Intake is satisfactory per her daughter. Dimension slightly better 03/12/2017 -- Dr. Jannetta Quint patient who had been seen 2 days ago and was doing fairly well. The patient is brought in by her daughter who noticed a new wound just above the previous wound on her sacral area and going on more to the left lateral side. She was very concerned and we asked her to get in for an opinion. 03/17/17; above is noted. The patient has developed a progressive area to the left of her original wound. This seems to this started with a ring of red skin with a more pale interior almost looking fungal. There was a rim of blister through part of the area although this did not look like zoster. They have been applying triamcinolone that was prescribed last week by Dr. Meyer Russel and the area has a fold to a linear band area which is confluent, erythematous and with obvious epidermal swelling but there is no overt tenderness or crepitus. She has lost some surface epithelium closer to the wound surface itself and now has a more superficial wound in this area  and the extending erythema goes towards the left buttock. This is well demarcated between involved in normal skin but once again does not appear to be at all tender. If there is a contact issue here I cannot get the history out of the daughter or the caregiver that are with her. 03/24/17; the patient arrives today with the wound slightly worse slightly more drainage. The bandlike area of erythema that I treated as a possible pineal infection has improved somewhat although proximally is still has confluent erythema without overt tenderness. We have been using silver alginate since the most recent deterioration. To the bandlike degree of erythema we have been using Lotrisone cream 03/31/17; patient arrives today with the wound slightly larger, necrotic surface and surrounding erythema. The bandlike area of erythema that I treated as a possible pineal infection is less swollen but still present I've been using Lotrisone cream on that largely related to the presence of a tinea looking infection when this was first seen. We've been using  silver alginate. MARKALA, SITTS ([id number]) X-ray that I ordered last week showed no acute bony abnormalities mild fecal impaction. Lab work showed a comprehensive metabolic panel that was normal including an albumin of 3.8. White count was 9.5 hemoglobin 12.3 differential count normal. C-reactive protein was less than 1 and sedimentation rate and 17. The latter 2 values does not support an ongoing bacterial infection. 04/14/17; patient arrives after a 2 week hiatus. Her wound is not in good condition. Although the base of the wound looks stable she still has an erythematous area that was apparently blistered over the weekend. This again points to the left. As our intake nurse pointed out today this is in the area where the tissue folds together and we may need to prevent try to prevent this. Lab work and x-ray that I did to 3 weeks ago were unremarkable including her  albumin nevertheless she is an extremely frail condition physically. We have have been using [name] 04/22/17; I changed her to silver alginate because of the surrounding maceration and moisture last week. The daughter did not like the way the wound looked in the middle of the week and changed her back to [place]. They're putting gauze on top of this. Thinks still using Lotrisone. 05/06/17 on evaluation today patient sacral wound appears to be doing okay and does not seem to be any worse. She is having no significant pain during evaluation today the secondary to mental status she is unable to rate or describe whether she had any pain she was not however flinching. Her daughter states that the wound does appear to be looking better to her. Still we are having difficulty with the skinfold that seems to be closing in on itself at this point. All in all I feel like she is making some good progress in the [name] seems to be the official for her. They do tell me that a refill if we are gonna continue that today. No fevers, chills, nausea, or vomiting noted at this time. 05/13/17 presents today for evaluation concerning her ongoing sacral pressure ulcer. Unfortunately she also has an area of deep tissue injury in the right Ischial region which is starting to show up. The sacral wound also continues to show signs of necrotic tissue overlying and has declined. Overall we really have not seen a significant improvement in the past several months in regard to the sacral wound and now patient is starting to develop a new wound in the right Ischial region. Obviously this is not trending in the direction that we want to see. No fevers, chills, nausea, or vomiting noted at this time. 05/19/17; I have not seen this wound and almost a month however there is nothing really positive to say about it. Necrotic tissue over the surface which superiorly I think abuts on her sacrum. She has surrounding erythema. I would be  surprised if there is not underlying osteomyelitis or soft tissue infection. 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 She apparently eats well per description although I wonder about this looking at her. Lab work I did probably 4 weeks ago however was really quite normal including a serum albumin 05/26/17; culture I did last week grew Escherichia coli and methicillin sensitive staph aureus which should've been well covered by the Augmentin and ciprofloxacin. Indeed the erythema around the wound in the bed of the wound looks somewhat better. Her intake is still satisfactory. They now have a near fluidized bed 06/02/17; they completed the antibiotics last  Friday. Using collagen. Daughter still reports eating and drinking well. There is less visualized erythema around the wound. 06/16/17; large pressure ulcer over her lower sacrum and coccyx. Using [name] to the wound bed. 06/30/17; certainly no change in dimensions of this large stage III wound over her sacrum and coccyx. They've been using [name]. There is no exposed bone. She has a candidal/tinea area in the right inguinal area. Other than that her daughter relates that she is eating and drinking well there changing her positioning to make sure the areas offloaded 07/14/17; no major change in the dimensions of this large stage III wound. Initially a smaller wound that became secondarily infected causing significant tissue breakdown although it is been stable in the last several weeks. Tunneling superiorly at roughly 1:00 no change here either. There is no bone palpable. Both the patient's daughter and caretaker states that she eats well. I have not rechecked her blood work 07/27/17; patient arrives in clinic today and generally a deteriorated looking state. Mild fever with axillary temperature of 100.4. Daughter reports she has not been eating and drinking well since yesterday. She looks more pale and thin and less responsive. We have been  using silver collagen to her wound 08/11/17; since the last time the patient was here things have gone better. Her fever went down and she started eating and drinking again. Culture I did of the wound showed methicillin sensitive staph aureus, Morganella and enterococcus. I only treated her with Keflex which would've not covered the Morganella and enterococcus however the purulent area on the 2:00 side of her wound is a lot better and the bandlike erythema that concern me also was resolved. I'd called the daughter last week to confirm that she was a lot better. She finished the Keflex last Thursday she also suffered a skin tear this morning perhaps while putting on her incontinence brief period is on the lateral aspect of her right leg. Clean wound with the surface epithelium not viable. 08/25/17; last week the patient was noted to have erythema around the wound margin and a slight fever which the patient's daughter says was 37. Our office was contacted by home health however we did not have a space to work the patient in that she went to see her primary physician Dr. Maurice March. She was not febrile during this visit on 08/21/17 there was erythema around the wound similar to last occasion. Dr. Maurice March in reference to my culture from 07/28/17 and put her on doxycycline capsules which they're opening twice a day for 10 days. SURENA, WELGE ([id number]) 09/17/17; patient arrives today with the wound bed looking fairly well granulated. There is undermining from 4 to 6:00 although this seems to of contracted slightly. She does not have obvious infection although the daughter states there was some darkening of the wound circumference that is not evident today. They state she is eating well. They are concerned about oral thrush 09/30/16; very fibrin looking granulated wound bed. Her undermining from 4 to 6:00 is about the same but also appears to be well granulated. She has rolled edges of senescent tissue from  roughly 7 to 12:00. There is no evidence of infection 10/13/17 on evaluation today patient appears to be doing fairly well all things considered in regard to her sacral wound. There's really not a lot of significant change or improvement she does have some evidence of contusion and deep tissue injury around the left border of the wound that patient's daughter did inquire about today.  Nonetheless overall the wound appears to be doing about the same in my opinion. There is no significant indication of infection there also is no significant slough noted at this point. 10/27/17 She is here in follow up evaluation of a sacral ulcer. She is accompanied by her daughter and caregiver. There is red granulation tissue throughout, persistent discoloration to left border; this appears consistent with deep tissue injury. There are multiple areas covered in foam borders, tegaderm, etc that are "preventative" with "no wounds". We will continue with prisma and continue with two week follow ups 11/17/17 on evaluation today patient appears to be doing decently well in regard to her wound at this point. She continues to have a sacral wound ulcer. We see her roughly every two weeks. In the last week she was actually in the hospital due to what was diagnosed as sepsis although no organisms were ever identified in the actual blood cultures. The hospital really was not sure that the wound was the cause of the infection but they really did not figure out anything else that would be a causative organism she did have a CT scan and that revealed no evidence of pneumonia there was also no evidence of urinary tract infection. Again it very well could have been the wound called in those although wound appears to be doing fairly well at this point. 12/15/17 on evaluation today patient actually appears to be doing about the same in regard to her sacral ulcer. The one thing different is that she does seem to have a rash where her  right arm is contracted and being held to the thorax. The area underlying both on the ventral side of the arm as well is the thorax where it comes in contact shows evidence of a rash which appears to be fungal in nature. There does not appear to be any evidence of infection lies at this point. There does not appear to be a rash consistent with shingles which was also of concern initially. No fevers, chills, nausea, or vomiting noted at this time. 12/29/17 on evaluation today patient appears to be doing a little better in regard to her sacral wound although the one changes the 12 o'clock location of the wound seems to have attached at one point which no longer allows this to pull back. This has caused an area of undermining that seems to be attaching as well in the 12 o'clock location. I do think that this is something that can be managed and is not necessarily a bad thing. Nonetheless she does seem to have some pain with exploration of this region of undermining. She continues to have an area under her right arm on the chest wall of erythema although this is a little better especially after the home health nurse is actually got in order for Diflucan times one for the patient. She is a new area on the left wrist that looks like because of the contraction her nail may have pushed in on her wrist area causing a slight cut which subsequently became infected. She has some honey crusted drainage noted. 01/12/18 on evaluation today patient is seen with her caregiver in the office at this point. The patient's daughter who normally accompanies her was not present for the evaluation. With that being said in regard to her sacral wound things actually appear to be doing a little bit better with more adherence at the 12 o'clock location of the wound to the bed. There still does appear to be an  area of undermining/tunneling at the 12 o'clock location very specifically which still does have some depth to  it. Fortunately there does not appear to be any evidence of significant infection. Otherwise her rash under the right breast region seems to be doing better and in general the sores on her bilateral lower extremities are also improving we have been using Xeroform gauze of these locations. Her wrist ulcer on the left has actually healed. 01/26/18 on evaluation today patient's wounds of her lower extremities actually appear to be doing better and overall this is good news currently. In fact these are all healed. It's mainly her sacral wound which is still open although it does show some signs of filling in in regard to the areas of undermining at this time. With all that being said I do still think that is gonna be some time before this wound will fully heal and I do believe that the hyper granular tissue may need to be addressed at some point although right now I'm more interested in getting this to fill in before addressing the hyper granulation. 02/09/18 on evaluation today patient appears to be doing well in regard to her sacral pressure ulcer as well is her bilateral lower extremity ulcers which seem to be healed at this point. The sacral wound is the only one remaining and she does not seem to have any evidence of infection or pain this is doing excellent in my pinion. KALENA, MANDER ([id number]) Patient History Unable to Obtain Patient History due to Altered Mental Status. Information obtained from Patient. Social History Never smoker, Marital Status - Married, Alcohol Use - Never, Drug Use - No History, Caffeine Use - Never. Medical And Surgical History Notes Ear/Nose/Mouth/Throat dysphagia Neurologic parkinsons, tremor Review of Systems (ROS) Constitutional Symptoms (General Health) Denies complaints or symptoms of Fever, Chills. Respiratory The patient has no complaints or symptoms. Cardiovascular The patient has no complaints or symptoms. Psychiatric The patient has no  complaints or symptoms. Objective Constitutional Thin and well-hydrated in no acute distress. Vitals Time Taken: 10:51 AM, Height: 60 in, Weight: 90 lbs, BMI: 17.6, Temperature: 98 F, Pulse: 103 bpm, Respiratory Rate: 18 breaths/min, Blood Pressure: 129/98 mmHg. Respiratory normal breathing without difficulty. clear to auscultation bilaterally. Cardiovascular regular rate and rhythm with normal S1, S2. Psychiatric Patient is not able to cooperate in decision making regarding care. Patient has dementia. patient is confused. General Notes: Patient's wound shows good granulation at this point there does not appear to be significant undermining although there is a little bit of both 12 o'clock as well is roughly 6 o'clock location. Overall this is progressing nicely. Integumentary (Hair, Skin) Wound #1 status is Open. Original cause of wound was Pressure Injury. The wound is located on the Midline Coccyx. The wound measures 4.5cm length x 2.3cm width x 0.4cm depth; 8.129cm^2 area and 3.252cm^3 volume. There is muscle and Fat Layer (Subcutaneous Tissue) Exposed exposed. There is no tunneling noted, however, there is undermining starting at DESTRY, BEZDEK. ([id number]) 6:00 and ending at 9:00 with a maximum distance of 1.2cm. There is a large amount of serosanguineous drainage noted. Foul odor after cleansing was noted. The wound margin is flat and intact. There is large (67-100%) red, hyper - granulation within the wound bed. There is a small (1-33%) amount of necrotic tissue within the wound bed including Adherent Slough. The periwound skin appearance exhibited: Induration, Maceration, Erythema. The periwound skin appearance did not exhibit: Callus, Crepitus, Excoriation, Rash, Scarring, Dry/Scaly, Atrophie Blanche, Cyanosis,  Ecchymosis, Hemosiderin Staining, Mottled, Pallor, Rubor. The surrounding wound skin color is noted with erythema which is circumferential. Periwound temperature was  noted as No Abnormality. The periwound has tenderness on palpation. Assessment Active Problems ICD-10 L89.153 - Pressure ulcer of sacral region, stage 3 G20 - Parkinson's disease L03.312 - Cellulitis of back [any part except buttock] S81.811D - Laceration without foreign body, right lower leg, subsequent encounter Plan Wound Cleansing: Wound #1 Midline Coccyx: Clean wound with Normal Saline. May Shower, gently pat wound dry prior to applying new dressing. Anesthetic (add to Medication List): Wound #1 Midline Coccyx: Topical Lidocaine 4% cream applied to wound bed prior to debridement (In Clinic Only). Skin Barriers/Peri-Wound Care: Wound #1 Midline Coccyx: Skin Prep - use only where the allevyn sticks not on reddened areas Antifungal powder-Nystatin - around peri-wound on coccyx (do not get in wound), under right breast and right arm, right abdomen Primary Wound Dressing: Wound #1 Midline Coccyx: Silver Collagen - prisma ag and pack into undermining as well slightly moisten with saline Secondary Dressing: Wound #1 Midline Coccyx: Dry Gauze Boardered Foam Dressing - HHRN PLEASE ORDER ALLEVYN LIFE BORDERED FOAM FOR PATIENT (OFF BRANDS IRRITATE HER SKIN) Dressing Change Frequency: Wound #1 Midline Coccyx: Change dressing every day. Follow-up Appointments: Return Appointment in 2 weeks. Off-Loading: Wound #1 Midline Coccyx: Roho cushion for wheelchair Mattress - fluidized air mattress Turn and reposition every 2 hours MCKINNLEY, COTTIER. ([id number]) Additional Orders / Instructions: Wound #1 Midline Coccyx: Increase protein intake. - please add protein supplements to patients diet Other: - please add vitamin A, vitamin C and zinc supplements to patients diet Home Health: Wound #1 Midline Coccyx: Continue Home Health Visits - [facility] Nurse may visit PRN to address patient s wound care needs. FACE TO FACE ENCOUNTER: MEDICARE and MEDICAID PATIENTS: I certify that  this patient is under my care and that I had a face-to-face encounter that meets the physician face-to-face encounter requirements with this patient on this date. The encounter with the patient was in whole or in part for the following MEDICAL CONDITION: (primary reason for Home Healthcare) MEDICAL NECESSITY: I certify, that based on my findings, NURSING services are a medically necessary home health service. HOME BOUND STATUS: I certify that my clinical findings support that this patient is homebound (i.e., Due to illness or injury, pt requires aid of supportive devices such as crutches, cane, wheelchairs, walkers, the use of special transportation or the assistance of another person to leave their place of residence. There is a normal inability to leave the home and doing so requires considerable and taxing effort. Other absences are for medical reasons / religious services and are infrequent or of short duration when for other reasons). If current dressing causes regression in wound condition, may D/C ordered dressing product/s and apply Normal Saline Moist Dressing daily until next Wound Healing Center / Other MD appointment. Notify Wound Healing Center of regression in wound condition at [phone]. Please direct any NON-WOUND related issues/requests for orders to patient's Primary Care Physician - Dr Vonita Moss The following medication(s) was prescribed: lidocaine topical 4 % cream 1 1 cream topical was prescribed at facility I'm gonna suggest that we continue with the Current wound care measures for the next week patient's daughter is in agreement with plan. We went to see her for reevaluation in two weeks time to see were things stand at that point. Please see above for specific wound care orders. We will see patient for re-evaluation in 1  week(s) here in the clinic. If anything worsens or changes patient will contact our office for additional Electronic Signature(s) Signed: 02/10/2018  12:50:15 AM By: Lenda Kelp PA-C Entered By: Lenda Kelp on 02/09/2018 23:53:42 [name] ([id number]) -------------------------------------------------------------------------------- ROS/PFSH Details Patient Name: [name] Date of Service: 02/09/2018 10:15 AM Medical Record Number: [id number] Patient Account Number: [id number] Date of Birth/Sex: Jul 23, 1923 (84 y.o. F) Treating RN: Phillis Haggis Primary Care Provider: Vonita Moss Other Clinician: Referring Provider: Vonita Moss Treating Provider/Extender: Linwood Dibbles, HOYT Weeks in Treatment: 72 Unable to Obtain Patient History due to oo Altered Mental Status Information Obtained From Patient Wound History Do you currently have one or more open woundso Yes How many open wounds do you currently haveo 1 Approximately how long have you had your woundso 1 week How have you been treating your wound(s) until nowo santyl and gauze Has your wound(s) ever healed and then re-openedo No Have you had any lab work done in the past montho No Have you tested positive for an antibiotic resistant organism (MRSA, VRE)o No Have you tested positive for osteomyelitis (bone infection)o No Have you had any tests for circulation on your legso No Constitutional Symptoms (General Health) Complaints and Symptoms: Negative for: Fever; Chills Eyes Medical History: Negative for: Cataracts; Glaucoma; Optic Neuritis Ear/Nose/Mouth/Throat Medical History: Negative for: Chronic sinus problems/congestion; Middle ear problems Past Medical History Notes: dysphagia Hematologic/Lymphatic Medical History: Positive for: Anemia Negative for: Hemophilia; Human Immunodeficiency Virus; Lymphedema; Sickle Cell Disease Respiratory Complaints and Symptoms: No Complaints or Symptoms Medical History: Negative for: Aspiration; Asthma; Chronic Obstructive Pulmonary Disease (COPD); Pneumothorax; Sleep Apnea; Tuberculosis Cardiovascular ELIZBETH, POSA. ([id number]) Complaints and Symptoms: No Complaints or Symptoms Medical History: Positive for: Hypertension Negative for: Angina; Arrhythmia; Congestive Heart Failure; Coronary Artery Disease; Deep Vein Thrombosis; Hypotension; Myocardial Infarction; Peripheral Arterial Disease; Peripheral Venous Disease; Phlebitis; Vasculitis Gastrointestinal Medical History: Negative for: Cirrhosis ; Colitis; Crohnos; Hepatitis A; Hepatitis B; Hepatitis C Endocrine Medical History: Negative for: Type I Diabetes; Type II Diabetes Genitourinary Medical History: Negative for: End Stage Renal Disease Immunological Medical History: Negative for: Lupus Erythematosus; Raynaudos; Scleroderma Integumentary (Skin) Medical History: Negative for: History of Burn; History of pressure wounds Musculoskeletal Medical History: Negative for: Gout; Rheumatoid Arthritis; Osteoarthritis; Osteomyelitis Neurologic Medical History: Positive for: Dementia Negative for: Neuropathy; Quadriplegia; Paraplegia; Seizure Disorder Past Medical History Notes: parkinsons, tremor Oncologic Medical History: Negative for: Received Chemotherapy; Received Radiation Psychiatric Complaints and Symptoms: No Complaints or Symptoms Medical History: Negative for: Anorexia/bulimia; Confinement Anxiety DOSIA, YODICE ([id number]) Immunizations Pneumococcal Vaccine: Received Pneumococcal Vaccination: Yes Immunization Notes: up to date Implantable Devices Family and Social History Never smoker; Marital Status - Married; Alcohol Use: Never; Drug Use: No History; Caffeine Use: Never; Financial Concerns: No; Food, Clothing or Shelter Needs: No; Support System Lacking: No; Transportation Concerns: No; Advanced Directives: Yes (Not Provided); Patient does not want information on Advanced Directives; Medical Power of Attorney: Yes - dtr (Not Provided) Physician Affirmation I have reviewed and agree with the above  information. Electronic Signature(s) Signed: 02/10/2018 12:50:15 AM By: Lenda Kelp PA-C Signed: 02/10/2018 4:20:38 PM By: Alejandro Mulling Entered By: Lenda Kelp on 02/09/2018 23:53:01 [name] ([id number]) -------------------------------------------------------------------------------- SuperBill Details Patient Name: [name] Date of Service: 02/09/2018 Medical Record Number: [id number] Patient Account Number: [id number] Date of Birth/Sex: 1922-12-21 (84 y.o. F) Treating RN: Phillis Haggis Primary Care Provider: Vonita Moss Other Clinician: Referring Provider: Vonita Moss Treating Provider/Extender: STONE III, HOYT Weeks in Treatment:  56 Diagnosis Coding ICD-10 Codes Code Description L89.153 Pressure ulcer of sacral region, stage 3 G20 Parkinson's disease L03.312 Cellulitis of back [any part except buttock] S81.811D Laceration without foreign body, right lower leg, subsequent encounter Facility Procedures CPT4 Code: 16109604 Description: 99213 - WOUND CARE VISIT-LEV 3 EST PT Modifier: Quantity: 1 Physician Procedures CPT4 Code: 5409811 Description: 99213 - WC PHYS LEVEL 3 - EST PT ICD-10 Diagnosis Description L89.153 Pressure ulcer of sacral region, stage 3 G20 Parkinson's disease L03.312 Cellulitis of back [any part except buttock] S81.811D Laceration without foreign body, right lower  leg, subseque Modifier: nt encounter Quantity: 1 Electronic Signature(s) Signed: 02/10/2018 12:50:15 AM By: Lenda Kelp PA-C Entered By: Lenda Kelp on 02/09/2018 23:54:02

## 2018-02-12 ENCOUNTER — Telehealth: Payer: Self-pay | Admitting: Family Medicine

## 2018-02-12 NOTE — Telephone Encounter (Signed)
Verbal orders given  

## 2018-02-12 NOTE — Telephone Encounter (Signed)
OK to continue to do that

## 2018-02-12 NOTE — Telephone Encounter (Signed)
Copied from CRM [id number]. Topic: Quick Communication - See Telephone Encounter >> Feb 12, 2018 12:42 PM Raquel Sarna wrote: [facility] LPN, [facility] [phone]  Verbal Orders: Wound care -  small quarter size on right hip w a pin sice of boggy skin in the middle Lower right calf - skin tear the size of a nickle

## 2018-02-12 NOTE — Telephone Encounter (Signed)
OK to give verbals for what they need to do

## 2018-02-12 NOTE — Telephone Encounter (Signed)
They have been using padded guaze on hip for cushion and steri-strips on the leg

## 2018-02-18 NOTE — Telephone Encounter (Signed)
Sunny Schlein, caregiver (Other) [phone]   Felicia called stating that the skin

## 2018-02-18 NOTE — Telephone Encounter (Signed)
Got kicked out of system  [place], caregiver (Other) [phone]   [name] called stating that the skin tear on lower R calf has gotten worse. It is now 1cm x 1cm, round, white patch in the middle, partial thickness, lost skin around it, some swelling and redness but cool to touch, requesting advice.

## 2018-02-18 NOTE — Telephone Encounter (Signed)
Routing to provider  

## 2018-02-18 NOTE — Telephone Encounter (Signed)
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

## 2018-02-18 NOTE — Telephone Encounter (Signed)
Do they have skilled nursing coming out?

## 2018-02-19 NOTE — Telephone Encounter (Signed)
They need to off load as much as they can. They can use duoderm on the area to try to keep it covered. If they need additional help or wound care, please let me know and i'll order it.

## 2018-02-19 NOTE — Telephone Encounter (Signed)
Care provider informed of Dr.Johnson's response.

## 2018-02-23 ENCOUNTER — Encounter: Payer: Medicare Other | Admitting: Physician Assistant

## 2018-02-23 DIAGNOSIS — L89153 Pressure ulcer of sacral region, stage 3: Secondary | ICD-10-CM | POA: Diagnosis not present

## 2018-02-25 ENCOUNTER — Telehealth: Payer: Self-pay | Admitting: Family Medicine

## 2018-02-25 NOTE — Telephone Encounter (Signed)
Verbal order was given to [facility] to give patient enema   Per Dr Laural Benes

## 2018-02-25 NOTE — Telephone Encounter (Signed)
Copied from CRM [id number]. Topic: Quick Communication - See Telephone Encounter >> Feb 25, 2018 11:37 AM Cipriano Bunker wrote: CRM for notification. See Telephone encounter for: 02/25/18.  Felicia from [facility] [phone] verbal orders - wife said he has not bowel movement and when she does it is very little .   Asking if needs to check for impactment or enema?   She is going to her today please call asap.

## 2018-02-25 NOTE — Telephone Encounter (Signed)
Agree with below. Addendum:    I believe that she has limits with moving and including, toileting, bathing, feeding, dressing and grooming. I believe the power wheelchair is needed for pt to be able to perform ADL's in her home

## 2018-02-28 NOTE — Progress Notes (Signed)
KELBIE, MORO ([id number]) Visit Report for 02/23/2018 Chief Complaint Document Details Patient Name: [name], [name] Date of Service: 02/23/2018 10:15 AM Medical Record Number: [id number] Patient Account Number: [id number] Date of Birth/Sex: Aug 28, 1923 (84 y.o. F) Treating RN: Phillis Haggis Primary Care Provider: Vonita Moss Other Clinician: Referring Provider: Vonita Moss Treating Provider/Extender: Linwood Dibbles, Treyveon Mochizuki Weeks in Treatment: 38 Information Obtained from: Patient Chief Complaint patient is here for follow up evaluation of a sacral pressure ulcer Electronic Signature(s) Signed: 02/24/2018 8:29:14 AM By: Lenda Kelp PA-C Entered By: Lenda Kelp on 02/23/2018 09:58:15 [name] ([id number]) -------------------------------------------------------------------------------- HPI Details Patient Name: [name] Date of Service: 02/23/2018 10:15 AM Medical Record Number: [id number] Patient Account Number: [id number] Date of Birth/Sex: 1922/10/02 (84 y.o. F) Treating RN: Phillis Haggis Primary Care Provider: Vonita Moss Other Clinician: Referring Provider: Vonita Moss Treating Provider/Extender: Linwood Dibbles, Jaquanda Wickersham Weeks in Treatment: 17 History of Present Illness HPI Description: 01/07/17 this is a 84 year old woman admitted to the clinic today for review of a pressure ulcer on her lower sacrum. She is referred from her primary physician's office after being seen on 3/22 with a 3 cm pressure area. Her daughter and caretaker accompanied her today state that the area first became obvious about a month ago and his since deteriorated. They have recently got [name] home health involved and have been using [name] to the wound. They have ordered a pressure relief surface for her mattress. They are turning her religiously. They state that she eats well and they've been forcing fluids on her. She is on a multivitamin. Looking through [facility] point last  albumin I see was 4.4 on 10/28. The patient has advanced parkinsonism which looks superficially like advanced Parkinson's disease although her daughter tells me she did not ever respond to Sinemet therefore this may have another pathology with signs of parkinsonism. However I think this is largely a mute point currently. She also has dementia and is nonambulatory. Since this started they have been keeping her in bed and turning her religiously every 2 hours. She lives at home in [place] with her husband with 24/7 care giving 01/13/17 santyl change qd. Still will require further debridement. continue santyl. 01/20/17; patient's wound actually looks some better less adherent necrotic surface. There is actually visible granulation. We're using [name] 01/27/17; better-looking surface but still a lot of necrotic tissue on the base of this wound. The periwound erythema is better than last week we are still using [name]. Her daughter tells [place] that she is still having trouble with the pressure-relief mattress through medical modalities 02/03/18; I had the patient scheduled for a two week followup however her daughter brought her in early concerned for discoloration on 2 areas of the wound circumference. We have bee using santyl 02/10/17;Better looking surface to the wound. Rim appears better suggesting better offloading. Using santyl 02/24/17; change to Silver Collegen last time. Wound appears better. 03/10/17; still using silver collagen religious offloading. Intake is satisfactory per her daughter. Dimension slightly better 03/12/2017 -- Dr. Jannetta Quint patient who had been seen 2 days ago and was doing fairly well. The patient is brought in by her daughter who noticed a new wound just above the previous wound on her sacral area and going on more to the left lateral side. She was very concerned and we asked her to get in for an opinion. 03/17/17; above is noted. The patient has developed a progressive area to the  left of her original wound. This  seems to this started with a ring of red skin with a more pale interior almost looking fungal. There was a rim of blister through part of the area although this did not look like zoster. They have been applying triamcinolone that was prescribed last week by [name] and the area has a fold to a linear band area which is confluent, erythematous and with obvious epidermal swelling but there is no overt tenderness or crepitus. She has lost some surface epithelium closer to the wound surface itself and now has a more superficial wound in this area and the extending erythema goes towards the left buttock. This is well demarcated between involved in normal skin but once again does not appear to be at all tender. If there is a contact issue here I cannot get the history out of the daughter or the caregiver that are with her. 03/24/17; the patient arrives today with the wound slightly worse slightly more drainage. The bandlike area of erythema that I treated as a possible pineal infection has improved somewhat although proximally is still has confluent erythema without overt tenderness. We have been using silver alginate since the most recent deterioration. To the bandlike degree of erythema we have been using Lotrisone cream 03/31/17; patient arrives today with the wound slightly larger, necrotic surface and surrounding erythema. The bandlike area of erythema that I treated as a possible pineal infection is less swollen but still present I've been using Lotrisone cream on that largely related to the presence of a tinea looking infection when this was first seen. We've been using silver alginate. X-ray that I ordered last week showed no acute bony abnormalities mild fecal impaction. Lab work showed a comprehensive metabolic panel that was normal including an albumin of 3.8. White count was 9.5 hemoglobin 12.3 differential count normal. C-reactive protein was less than 1 and  sedimentation rate and 17. The latter 2 values does not support an ongoing bacterial infection. 04/14/17; patient arrives after a 2 week hiatus. Her wound is not in good condition. Although the base of the wound looks Tina MainlandSHAW, Laterica W. ([id number]) stable she still has an erythematous area that was apparently blistered over the weekend. This again points to the left. As our intake nurse pointed out today this is in the area where the tissue folds together and we may need to prevent try to prevent this. Lab work and x-ray that I did to 3 weeks ago were unremarkable including her albumin nevertheless she is an extremely frail condition physically. We have have been using [name] 04/22/17; I changed her to silver alginate because of the surrounding maceration and moisture last week. The daughter did not like the way the wound looked in the middle of the week and changed her back to [place]. They're putting gauze on top of this. Thinks still using Lotrisone. 05/06/17 on evaluation today patient sacral wound appears to be doing okay and does not seem to be any worse. She is having no significant pain during evaluation today the secondary to mental status she is unable to rate or describe whether she had any pain she was not however flinching. Her daughter states that the wound does appear to be looking better to her. Still we are having difficulty with the skinfold that seems to be closing in on itself at this point. All in all I feel like she is making some good progress in the [name] seems to be the official for her. They do tell me that a refill if  we are gonna continue that today. No fevers, chills, nausea, or vomiting noted at this time. 05/13/17 presents today for evaluation concerning her ongoing sacral pressure ulcer. Unfortunately she also has an area of deep tissue injury in the right Ischial region which is starting to show up. The sacral wound also continues to show signs of necrotic tissue  overlying and has declined. Overall we really have not seen a significant improvement in the past several months in regard to the sacral wound and now patient is starting to develop a new wound in the right Ischial region. Obviously this is not trending in the direction that we want to see. No fevers, chills, nausea, or vomiting noted at this time. 05/19/17; I have not seen this wound and almost a month however there is nothing really positive to say about it. Necrotic tissue over the surface which superiorly I think abuts on her sacrum. She has surrounding erythema. I would be surprised if there is not underlying osteomyelitis or soft tissue infection. 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 She apparently eats well per description although I wonder about this looking at her. Lab work I did probably 4 weeks ago however was really quite normal including a serum albumin 05/26/17; culture I did last week grew Escherichia coli and methicillin sensitive staph aureus which should've been well covered by the Augmentin and ciprofloxacin. Indeed the erythema around the wound in the bed of the wound looks somewhat better. Her intake is still satisfactory. They now have a near fluidized bed 06/02/17; they completed the antibiotics last Friday. Using collagen. Daughter still reports eating and drinking well. There is less visualized erythema around the wound. 06/16/17; large pressure ulcer over her lower sacrum and coccyx. Using [name] to the wound bed. 06/30/17; certainly no change in dimensions of this large stage III wound over her sacrum and coccyx. They've been using [name]. There is no exposed bone. She has a candidal/tinea area in the right inguinal area. Other than that her daughter relates that she is eating and drinking well there changing her positioning to make sure the areas offloaded 07/14/17; no major change in the dimensions of this large stage III wound. Initially a smaller wound that  became secondarily infected causing significant tissue breakdown although it is been stable in the last several weeks. Tunneling superiorly at roughly 1:00 no change here either. There is no bone palpable. Both the patient's daughter and caretaker states that she eats well. I have not rechecked her blood work 07/27/17; patient arrives in clinic today and generally a deteriorated looking state. Mild fever with axillary temperature of 100.4. Daughter reports she has not been eating and drinking well since yesterday. She looks more pale and thin and less responsive. We have been using silver collagen to her wound 08/11/17; since the last time the patient was here things have gone better. Her fever went down and she started eating and drinking again. Culture I did of the wound showed methicillin sensitive staph aureus, Morganella and enterococcus. I only treated her with Keflex which would've not covered the Morganella and enterococcus however the purulent area on the 2:00 side of her wound is a lot better and the bandlike erythema that concern me also was resolved. I'd called the daughter last week to confirm that she was a lot better. She finished the Keflex last Thursday she also suffered a skin tear this morning perhaps while putting on her incontinence brief period is on the lateral aspect of  her right leg. Clean wound with the surface epithelium not viable. 08/25/17; last week the patient was noted to have erythema around the wound margin and a slight fever which the patient's daughter says was 50. Our office was contacted by home health however we did not have a space to work the patient in that she went to see her primary physician Dr. Maurice March. She was not febrile during this visit on 08/21/17 there was erythema around the wound similar to last occasion. Dr. Maurice March in reference to my culture from 07/28/17 and put her on doxycycline capsules which they're opening twice a day for 10 days. 09/17/17;  patient arrives today with the wound bed looking fairly well granulated. There is undermining from 4 to 6:00 although this seems to of contracted slightly. She does not have obvious infection although the daughter states there was some darkening of the wound circumference that is not evident today. They state she is eating well. They are concerned about oral thrush 09/30/16; very fibrin looking granulated wound bed. Her undermining from 4 to 6:00 is about the same but also appears to be well granulated. She has rolled edges of senescent tissue from roughly 7 to 12:00. There is no evidence of infection [name], [name] ([id number]) 10/13/17 on evaluation today patient appears to be doing fairly well all things considered in regard to her sacral wound. There's really not a lot of significant change or improvement she does have some evidence of contusion and deep tissue injury around the left border of the wound that patient's daughter did inquire about today. Nonetheless overall the wound appears to be doing about the same in my opinion. There is no significant indication of infection there also is no significant slough noted at this point. 10/27/17 She is here in follow up evaluation of a sacral ulcer. She is accompanied by her daughter and caregiver. There is red granulation tissue throughout, persistent discoloration to left border; this appears consistent with deep tissue injury. There are multiple areas covered in foam borders, tegaderm, etc that are "preventative" with "no wounds". We will continue with prisma and continue with two week follow ups 11/17/17 on evaluation today patient appears to be doing decently well in regard to her wound at this point. She continues to have a sacral wound ulcer. We see her roughly every two weeks. In the last week she was actually in the hospital due to what was diagnosed as sepsis although no organisms were ever identified in the actual blood cultures. The  hospital really was not sure that the wound was the cause of the infection but they really did not figure out anything else that would be a causative organism she did have a CT scan and that revealed no evidence of pneumonia there was also no evidence of urinary tract infection. Again it very well could have been the wound called in those although wound appears to be doing fairly well at this point. 12/15/17 on evaluation today patient actually appears to be doing about the same in regard to her sacral ulcer. The one thing different is that she does seem to have a rash where her right arm is contracted and being held to the thorax. The area underlying both on the ventral side of the arm as well is the thorax where it comes in contact shows evidence of a rash which appears to be fungal in nature. There does not appear to be any evidence of infection lies at this point. There does not  appear to be a rash consistent with shingles which was also of concern initially. No fevers, chills, nausea, or vomiting noted at this time. 12/29/17 on evaluation today patient appears to be doing a little better in regard to her sacral wound although the one changes the 12 o'clock location of the wound seems to have attached at one point which no longer allows this to pull back. This has caused an area of undermining that seems to be attaching as well in the 12 o'clock location. I do think that this is something that can be managed and is not necessarily a bad thing. Nonetheless she does seem to have some pain with exploration of this region of undermining. She continues to have an area under her right arm on the chest wall of erythema although this is a little better especially after the home health nurse is actually got in order for Diflucan times one for the patient. She is a new area on the left wrist that looks like because of the contraction her nail may have pushed in on her wrist area causing a slight cut which  subsequently became infected. She has some honey crusted drainage noted. 01/12/18 on evaluation today patient is seen with her caregiver in the office at this point. The patient's daughter who normally accompanies her was not present for the evaluation. With that being said in regard to her sacral wound things actually appear to be doing a little bit better with more adherence at the 12 o'clock location of the wound to the bed. There still does appear to be an area of undermining/tunneling at the 12 o'clock location very specifically which still does have some depth to it. Fortunately there does not appear to be any evidence of significant infection. Otherwise her rash under the right breast region seems to be doing better and in general the sores on her bilateral lower extremities are also improving we have been using Xeroform gauze of these locations. Her wrist ulcer on the left has actually healed. 01/26/18 on evaluation today patient's wounds of her lower extremities actually appear to be doing better and overall this is good news currently. In fact these are all healed. It's mainly her sacral wound which is still open although it does show some signs of filling in in regard to the areas of undermining at this time. With all that being said I do still think that is gonna be some time before this wound will fully heal and I do believe that the hyper granular tissue may need to be addressed at some point although right now I'm more interested in getting this to fill in before addressing the hyper granulation. 02/09/18 on evaluation today patient appears to be doing well in regard to her sacral pressure ulcer as well is her bilateral lower extremity ulcers which seem to be healed at this point. The sacral wound is the only one remaining and she does not seem to have any evidence of infection or pain this is doing excellent in my pinion. 02/23/18 on evaluation today patient appears to be doing rather  well in regard to her sacral ulcer. She has been tolerating the dressing changes without complication and overall I do feel like there's new granulation noted this seems to be progressing nicely. With that being said the biggest issue that I see at this point is that the patient is having issues with two new openings on her legs as well as her left elbow were there slight skin tears.  This is just due to the fragility of her skin and with normal rotational movement she is prone to having injury as such. Otherwise everything seems to be progressing nicely in my opinion. [name], [name] ([id number]) Electronic Signature(s) Signed: 02/24/2018 8:29:14 AM By: Lenda Kelp PA-C Entered By: Lenda Kelp on 02/23/2018 11:52:38 [name] ([id number]) -------------------------------------------------------------------------------- Physical Exam Details Patient Name: [name] Date of Service: 02/23/2018 10:15 AM Medical Record Number: [id number] Patient Account Number: [id number] Date of Birth/Sex: 12-Jul-1923 (84 y.o. F) Treating RN: Phillis Haggis Primary Care Provider: Vonita Moss Other Clinician: Referring Provider: Vonita Moss Treating Provider/Extender: STONE III, Syble Picco Weeks in Treatment: 47 Constitutional Chronically ill appearing but in no apparent acute distress. Respiratory normal breathing without difficulty. clear to auscultation bilaterally. Cardiovascular regular rate and rhythm with normal S1, S2. Psychiatric this patient is able to make decisions and demonstrates good insight into disease process. Alert and Oriented x 3. pleasant and cooperative. Notes Currently patient's wound in regard to the sacral region shows good granulation there does not appear to be any evidence of infection which is good news. She does not seem to have any significant pain with cleansing of the area which is also good news. No sharp debridement was necessary in regard to the  other new wounds that were noted today these were all very superficial skin tears which are likewise doing well. Electronic Signature(s) Signed: 02/24/2018 8:29:14 AM By: Lenda Kelp PA-C Entered By: Lenda Kelp on 02/23/2018 11:53:36 [name] ([id number]) -------------------------------------------------------------------------------- Physician Orders Details Patient Name: [name] Date of Service: 02/23/2018 10:15 AM Medical Record Number: [id number] Patient Account Number: [id number] Date of Birth/Sex: [birth year] (84 y.o. F) Treating RN: Phillis Haggis Primary Care Provider: Vonita Moss Other Clinician: Referring Provider: Vonita Moss Treating Provider/Extender: Linwood Dibbles, Ege Muckey Weeks in Treatment: 79 Verbal / Phone Orders: Yes Clinician: Ashok Cordia, Debi Read Back and Verified: Yes Diagnosis Coding ICD-10 Coding Code Description L89.153 Pressure ulcer of sacral region, stage 3 G20 Parkinson's disease L03.312 Cellulitis of back [any part except buttock] S81.811D Laceration without foreign body, right lower leg, subsequent encounter Wound Cleansing Wound #1 Midline Coccyx o Clean wound with Normal Saline. o May Shower, gently pat wound dry prior to applying new dressing. Wound #7 Left,Anterior Lower Leg o Clean wound with Normal Saline. o May Shower, gently pat wound dry prior to applying new dressing. Wound #8 Right,Posterior Knee o Clean wound with Normal Saline. o May Shower, gently pat wound dry prior to applying new dressing. Wound #9 Left Elbow o Clean wound with Normal Saline. o May Shower, gently pat wound dry prior to applying new dressing. Anesthetic (add to Medication List) Wound #1 Midline Coccyx o Topical Lidocaine 4% cream applied to wound bed prior to debridement (In Clinic Only). Wound #7 Left,Anterior Lower Leg o Topical Lidocaine 4% cream applied to wound bed prior to debridement (In Clinic Only). Wound #8  Right,Posterior Knee o Topical Lidocaine 4% cream applied to wound bed prior to debridement (In Clinic Only). Skin Barriers/Peri-Wound Care Wound #1 Midline Coccyx o Skin Prep - use only where the allevyn sticks not on reddened areas o Antifungal powder-Nystatin - around peri-wound on coccyx (do not get in wound), under right breast and right arm, right abdomen Primary Wound Dressing Wound #1 Midline Coccyx CHERYAL, SALAS. ([id number]) o Silver Collagen - prisma ag and pack into undermining as well slightly moisten with saline Wound #7 Left,Anterior Lower Leg o Xeroform Wound #8 Right,Posterior Knee   o Xeroform Wound #9 Left Elbow o Xeroform Secondary Dressing Wound #1 Midline Coccyx o Boardered Foam Dressing - HHRN PLEASE ORDER ALLEVYN LIFE BORDERED FOAM FOR PATIENT (OFF BRANDS IRRITATE HER SKIN) o Drawtex - HHRN please order Wound #7 Left,Anterior Lower Leg o Conform/Kerlix o Non-adherent pad - telfa Wound #8 Right,Posterior Knee o Conform/Kerlix o Non-adherent pad - telfa Wound #9 Left Elbow o Conform/Kerlix o Non-adherent pad - telfa Dressing Change Frequency Wound #1 Midline Coccyx o Change dressing every day. Wound #7 Left,Anterior Lower Leg o Change dressing every day. Wound #8 Right,Posterior Knee o Change dressing every day. Wound #9 Left Elbow o Change dressing every day. Follow-up Appointments o Return Appointment in 2 weeks. Off-Loading Wound #1 Midline Coccyx o Roho cushion for wheelchair o Mattress - fluidized air mattress o Turn and reposition every 2 hours Additional Orders / Instructions Wound #1 Midline Coccyx o Increase protein intake. - please add protein supplements to patients diet o Other: - please add vitamin A, vitamin C and zinc supplements to patients diet [name], [name] ([id number]) Home Health Wound #1 Midline Coccyx o Continue Home Health Visits - [facility] o Home Health Nurse  may visit PRN to address patientos wound care needs. o FACE TO FACE ENCOUNTER: MEDICARE and MEDICAID PATIENTS: I certify that this patient is under my care and that I had a face-to-face encounter that meets the physician face-to-face encounter requirements with this patient on this date. The encounter with the patient was in whole or in part for the following MEDICAL CONDITION: (primary reason for Home Healthcare) MEDICAL NECESSITY: I certify, that based on my findings, NURSING services are a medically necessary home health service. HOME BOUND STATUS: I certify that my clinical findings support that this patient is homebound (i.e., Due to illness or injury, pt requires aid of supportive devices such as crutches, cane, wheelchairs, walkers, the use of special transportation or the assistance of another person to leave their place of residence. There is a normal inability to leave the home and doing so requires considerable and taxing effort. Other absences are for medical reasons / religious services and are infrequent or of short duration when for other reasons). o If current dressing causes regression in wound condition, may D/C ordered dressing product/s and apply Normal Saline Moist Dressing daily until next Wound Healing Center / Other MD appointment. Notify Wound Healing Center of regression in wound condition at [phone]. o Please direct any NON-WOUND related issues/requests for orders to patient's Primary Care Physician - Dr Vonita Moss Wound #7 Left,Anterior Lower Leg o Continue Home Health Visits - [facility] o Home Health Nurse may visit PRN to address patientos wound care needs. o FACE TO FACE ENCOUNTER: MEDICARE and MEDICAID PATIENTS: I certify that this patient is under my care and that I had a face-to-face encounter that meets the physician face-to-face encounter requirements with this patient on this date. The encounter with the patient was in whole or in part for  the following MEDICAL CONDITION: (primary reason for Home Healthcare) MEDICAL NECESSITY: I certify, that based on my findings, NURSING services are a medically necessary home health service. HOME BOUND STATUS: I certify that my clinical findings support that this patient is homebound (i.e., Due to illness or injury, pt requires aid of supportive devices such as crutches, cane, wheelchairs, walkers, the use of special transportation or the assistance of another person to leave their place of residence. There is a normal inability to leave the home and doing so requires considerable  and taxing effort. Other absences are for medical reasons / religious services and are infrequent or of short duration when for other reasons). o If current dressing causes regression in wound condition, may D/C ordered dressing product/s and apply Normal Saline Moist Dressing daily until next Wound Healing Center / Other MD appointment. Notify Wound Healing Center of regression in wound condition at [phone]. o Please direct any NON-WOUND related issues/requests for orders to patient's Primary Care Physician - Dr Vonita Moss Wound #8 Right,Posterior Knee o Continue Home Health Visits - [facility] o Home Health Nurse may visit PRN to address patientos wound care needs. o FACE TO FACE ENCOUNTER: MEDICARE and MEDICAID PATIENTS: I certify that this patient is under my care and that I had a face-to-face encounter that meets the physician face-to-face encounter requirements with this patient on this date. The encounter with the patient was in whole or in part for the following MEDICAL CONDITION: (primary reason for Home Healthcare) MEDICAL NECESSITY: I certify, that based on my findings, NURSING services are a medically necessary home health service. HOME BOUND STATUS: I certify that my clinical findings support that this patient is homebound (i.e., Due to illness or injury, pt requires aid of supportive  devices such as crutches, cane, wheelchairs, walkers, the use of special transportation or the assistance of another person to leave their place of residence. There is a normal inability to leave the home and doing so requires considerable and taxing effort. Other absences are for medical reasons / religious services and are infrequent or of short duration when for other reasons). o If current dressing causes regression in wound condition, may D/C ordered dressing product/s and apply Normal Saline Moist Dressing daily until next Wound Healing Center / Other MD appointment. Notify Wound Healing Center of regression in wound condition at [phone]. o Please direct any NON-WOUND related issues/requests for orders to patient's Primary Care Physician - Dr Vonita Moss [name], [name] ([id number]) Wound #9 Left Elbow o Continue Home Health Visits - [facility] o Home Health Nurse may visit PRN to address patientos wound care needs. o FACE TO FACE ENCOUNTER: MEDICARE and MEDICAID PATIENTS: I certify that this patient is under my care and that I had a face-to-face encounter that meets the physician face-to-face encounter requirements with this patient on this date. The encounter with the patient was in whole or in part for the following MEDICAL CONDITION: (primary reason for Home Healthcare) MEDICAL NECESSITY: I certify, that based on my findings, NURSING services are a medically necessary home health service. HOME BOUND STATUS: I certify that my clinical findings support that this patient is homebound (i.e., Due to illness or injury, pt requires aid of supportive devices such as crutches, cane, wheelchairs, walkers, the use of special transportation or the assistance of another person to leave their place of residence. There is a normal inability to leave the home and doing so requires considerable and taxing effort. Other absences are for medical reasons / religious services and are  infrequent or of short duration when for other reasons). o If current dressing causes regression in wound condition, may D/C ordered dressing product/s and apply Normal Saline Moist Dressing daily until next Wound Healing Center / Other MD appointment. Notify Wound Healing Center of regression in wound condition at [phone]. o Please direct any NON-WOUND related issues/requests for orders to patient's Primary Care Physician - Dr Vonita Moss Patient Medications Allergies: Phenergan, Sulfa (Sulfonamide Antibiotics) Notifications Medication Indication Start End lidocaine DOSE 1 - topical 4 %  cream - 1 cream topical Electronic Signature(s) Signed: 02/24/2018 8:29:14 AM By: Lenda Kelp PA-C Signed: 02/26/2018 5:38:20 PM By: Alejandro Mulling Entered By: Alejandro Mulling on 02/23/2018 11:15:28 [name] ([id number]) -------------------------------------------------------------------------------- Prescription 02/23/2018 Patient Name: [name] Provider: Lenda Kelp PA-C Date of Birth: 01/06/1923 NPI#: [id number] Sex: F DEA#: [id number] Phone #: [phone] License #: Patient Address: [facility] [address] [facility] [place], [place] [address] [address] [place], [place] [address] [phone] Allergies Phenergan Sulfa (Sulfonamide Antibiotics) Medication Medication: Route: Strength: Form: lidocaine topical 4% cream Class: TOPICAL LOCAL ANESTHETICS Dose: Frequency / Time: Indication: 1 1 cream topical Number of Refills: Number of Units: 0 Generic Substitution: Start Date: End Date: Administered at Substitution Permitted Facility: Yes Time Administered: Time Discontinued: Note to Pharmacy: Signature(s): Date(s): Electronic Signature(s) Signed: 02/24/2018 8:29:14 AM By: Lenda Kelp PA-C Signed: 02/26/2018 5:38:20 PM By: Mateo Flow  ([id number]) Entered By: Alejandro Mulling on 02/23/2018 11:15:33 [name] ([id number]) --------------------------------------------------------------------------------  Problem List Details Patient Name: [name] Date of Service: 02/23/2018 10:15 AM Medical Record Number: [id number] Patient Account Number: [id number] Date of Birth/Sex: 07-May-1923 (84 y.o. F) Treating RN: Phillis Haggis Primary Care Provider: Vonita Moss Other Clinician: Referring Provider: Vonita Moss Treating Provider/Extender: Linwood Dibbles, Andra Matsuo Weeks in Treatment: 60 Active Problems ICD-10 Impacting Encounter Code Description Active Date Wound Healing Diagnosis L89.153 Pressure ulcer of sacral region, stage 3 01/07/2017 No Yes G20 Parkinson's disease 01/07/2017 No Yes L03.312 Cellulitis of back [any part except buttock] 07/28/2017 No Yes S81.811D Laceration without foreign body, right lower leg, subsequent 08/11/2017 No Yes encounter Inactive Problems Resolved Problems Electronic Signature(s) Signed: 02/24/2018 8:29:14 AM By: Lenda Kelp PA-C Entered By: Lenda Kelp on 02/23/2018 09:58:03 [name] ([id number]) -------------------------------------------------------------------------------- Progress Note Details Patient Name: [name] Date of Service: 02/23/2018 10:15 AM Medical Record Number: [id number] Patient Account Number: [id number] Date of Birth/Sex: Aug 20, 1923 (84 y.o. F) Treating RN: Phillis Haggis Primary Care Provider: Vonita Moss Other Clinician: Referring Provider: Vonita Moss Treating Provider/Extender: Linwood Dibbles, Caitlynn Ju Weeks in Treatment: 68 Subjective Chief Complaint Information obtained from Patient patient is here for follow up evaluation of a sacral pressure ulcer History of Present Illness (HPI) 01/07/17 this is a 84 year old woman admitted to the clinic today for review of a pressure ulcer on her lower sacrum. She is referred from her  primary physician's office after being seen on 3/22 with a 3 cm pressure area. Her daughter and caretaker accompanied her today state that the area first became obvious about a month ago and his since deteriorated. They have recently got [name] home health involved and have been using [name] to the wound. They have ordered a pressure relief surface for her mattress. They are turning her religiously. They state that she eats well and they've been forcing fluids on her. She is on a multivitamin. Looking through [facility] point last albumin I see was 4.4 on 10/28. The patient has advanced parkinsonism which looks superficially like advanced Parkinson's disease although her daughter tells me she did not ever respond to Sinemet therefore this may have another pathology with signs of parkinsonism. However I think this is largely a mute point currently. She also has dementia and is nonambulatory. Since this started they have been keeping her in bed and turning her religiously every 2 hours. She lives at home in [place] with her husband with 24/7 care giving 01/13/17  santyl change qd. Still will require further debridement. continue santyl. 01/20/17; patient's wound actually looks some better less adherent necrotic surface. There is actually visible granulation. We're using [name] 01/27/17; better-looking surface but still a lot of necrotic tissue on the base of this wound. The periwound erythema is better than last week we are still using [name]. Her daughter tells [place] that she is still having trouble with the pressure-relief mattress through medical modalities 02/03/18; I had the patient scheduled for a two week followup however her daughter brought her in early concerned for discoloration on 2 areas of the wound circumference. We have bee using santyl 02/10/17;Better looking surface to the wound. Rim appears better suggesting better offloading. Using santyl 02/24/17; change to Silver Collegen last time.  Wound appears better. 03/10/17; still using silver collagen religious offloading. Intake is satisfactory per her daughter. Dimension slightly better 03/12/2017 -- Dr. Jannetta Quint patient who had been seen 2 days ago and was doing fairly well. The patient is brought in by her daughter who noticed a new wound just above the previous wound on her sacral area and going on more to the left lateral side. She was very concerned and we asked her to get in for an opinion. 03/17/17; above is noted. The patient has developed a progressive area to the left of her original wound. This seems to this started with a ring of red skin with a more pale interior almost looking fungal. There was a rim of blister through part of the area although this did not look like zoster. They have been applying triamcinolone that was prescribed last week by Dr. Meyer Russel and the area has a fold to a linear band area which is confluent, erythematous and with obvious epidermal swelling but there is no overt tenderness or crepitus. She has lost some surface epithelium closer to the wound surface itself and now has a more superficial wound in this area and the extending erythema goes towards the left buttock. This is well demarcated between involved in normal skin but once again does not appear to be at all tender. If there is a contact issue here I cannot get the history out of the daughter or the caregiver that are with her. 03/24/17; the patient arrives today with the wound slightly worse slightly more drainage. The bandlike area of erythema that I treated as a possible pineal infection has improved somewhat although proximally is still has confluent erythema without overt tenderness. We have been using silver alginate since the most recent deterioration. To the bandlike degree of erythema we have been using Lotrisone cream 03/31/17; patient arrives today with the wound slightly larger, necrotic surface and surrounding erythema. The bandlike  area of erythema that I treated as a possible pineal infection is less swollen but still present I've been using Lotrisone cream on that largely related to the presence of a tinea looking infection when this was first seen. We've been using silver alginate. [name], [name] ([id number]) X-ray that I ordered last week showed no acute bony abnormalities mild fecal impaction. Lab work showed a comprehensive metabolic panel that was normal including an albumin of 3.8. White count was 9.5 hemoglobin 12.3 differential count normal. C-reactive protein was less than 1 and sedimentation rate and 17. The latter 2 values does not support an ongoing bacterial infection. 04/14/17; patient arrives after a 2 week hiatus. Her wound is not in good condition. Although the base of the wound looks stable she still has an erythematous area that was apparently  blistered over the weekend. This again points to the left. As our intake nurse pointed out today this is in the area where the tissue folds together and we may need to prevent try to prevent this. Lab work and x-ray that I did to 3 weeks ago were unremarkable including her albumin nevertheless she is an extremely frail condition physically. We have have been using [name] 04/22/17; I changed her to silver alginate because of the surrounding maceration and moisture last week. The daughter did not like the way the wound looked in the middle of the week and changed her back to [place]. They're putting gauze on top of this. Thinks still using Lotrisone. 05/06/17 on evaluation today patient sacral wound appears to be doing okay and does not seem to be any worse. She is having no significant pain during evaluation today the secondary to mental status she is unable to rate or describe whether she had any pain she was not however flinching. Her daughter states that the wound does appear to be looking better to her. Still we are having difficulty with the skinfold that seems to  be closing in on itself at this point. All in all I feel like she is making some good progress in the [name] seems to be the official for her. They do tell me that a refill if we are gonna continue that today. No fevers, chills, nausea, or vomiting noted at this time. 05/13/17 presents today for evaluation concerning her ongoing sacral pressure ulcer. Unfortunately she also has an area of deep tissue injury in the right Ischial region which is starting to show up. The sacral wound also continues to show signs of necrotic tissue overlying and has declined. Overall we really have not seen a significant improvement in the past several months in regard to the sacral wound and now patient is starting to develop a new wound in the right Ischial region. Obviously this is not trending in the direction that we want to see. No fevers, chills, nausea, or vomiting noted at this time. 05/19/17; I have not seen this wound and almost a month however there is nothing really positive to say about it. Necrotic tissue over the surface which superiorly I think abuts on her sacrum. She has surrounding erythema. I would be surprised if there is not underlying osteomyelitis or soft tissue infection. 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 She apparently eats well per description although I wonder about this looking at her. Lab work I did probably 4 weeks ago however was really quite normal including a serum albumin 05/26/17; culture I did last week grew Escherichia coli and methicillin sensitive staph aureus which should've been well covered by the Augmentin and ciprofloxacin. Indeed the erythema around the wound in the bed of the wound looks somewhat better. Her intake is still satisfactory. They now have a near fluidized bed 06/02/17; they completed the antibiotics last Friday. Using collagen. Daughter still reports eating and drinking well. There is less visualized erythema around the wound. 06/16/17; large  pressure ulcer over her lower sacrum and coccyx. Using [name] to the wound bed. 06/30/17; certainly no change in dimensions of this large stage III wound over her sacrum and coccyx. They've been using [name]. There is no exposed bone. She has a candidal/tinea area in the right inguinal area. Other than that her daughter relates that she is eating and drinking well there changing her positioning to make sure the areas offloaded 07/14/17; no major change in  the dimensions of this large stage III wound. Initially a smaller wound that became secondarily infected causing significant tissue breakdown although it is been stable in the last several weeks. Tunneling superiorly at roughly 1:00 no change here either. There is no bone palpable. Both the patient's daughter and caretaker states that she eats well. I have not rechecked her blood work 07/27/17; patient arrives in clinic today and generally a deteriorated looking state. Mild fever with axillary temperature of 100.4. Daughter reports she has not been eating and drinking well since yesterday. She looks more pale and thin and less responsive. We have been using silver collagen to her wound 08/11/17; since the last time the patient was here things have gone better. Her fever went down and she started eating and drinking again. Culture I did of the wound showed methicillin sensitive staph aureus, Morganella and enterococcus. I only treated her with Keflex which would've not covered the Morganella and enterococcus however the purulent area on the 2:00 side of her wound is a lot better and the bandlike erythema that concern me also was resolved. I'd called the daughter last week to confirm that she was a lot better. She finished the Keflex last Thursday she also suffered a skin tear this morning perhaps while putting on her incontinence brief period is on the lateral aspect of her right leg. Clean wound with the surface epithelium not viable. 08/25/17;  last week the patient was noted to have erythema around the wound margin and a slight fever which the patient's daughter says was 74. Our office was contacted by home health however we did not have a space to work the patient in that she went to see her primary physician Dr. Maurice March. She was not febrile during this visit on 08/21/17 there was erythema around the wound similar to last occasion. Dr. Maurice March in reference to my culture from 07/28/17 and put her on doxycycline capsules which they're opening twice a day for 10 days. [name], [name] ([id number]) 09/17/17; patient arrives today with the wound bed looking fairly well granulated. There is undermining from 4 to 6:00 although this seems to of contracted slightly. She does not have obvious infection although the daughter states there was some darkening of the wound circumference that is not evident today. They state she is eating well. They are concerned about oral thrush 09/30/16; very fibrin looking granulated wound bed. Her undermining from 4 to 6:00 is about the same but also appears to be well granulated. She has rolled edges of senescent tissue from roughly 7 to 12:00. There is no evidence of infection 10/13/17 on evaluation today patient appears to be doing fairly well all things considered in regard to her sacral wound. There's really not a lot of significant change or improvement she does have some evidence of contusion and deep tissue injury around the left border of the wound that patient's daughter did inquire about today. Nonetheless overall the wound appears to be doing about the same in my opinion. There is no significant indication of infection there also is no significant slough noted at this point. 10/27/17 She is here in follow up evaluation of a sacral ulcer. She is accompanied by her daughter and caregiver. There is red granulation tissue throughout, persistent discoloration to left border; this appears consistent with deep tissue  injury. There are multiple areas covered in foam borders, tegaderm, etc that are "preventative" with "no wounds". We will continue with prisma and continue with two week follow ups 11/17/17  on evaluation today patient appears to be doing decently well in regard to her wound at this point. She continues to have a sacral wound ulcer. We see her roughly every two weeks. In the last week she was actually in the hospital due to what was diagnosed as sepsis although no organisms were ever identified in the actual blood cultures. The hospital really was not sure that the wound was the cause of the infection but they really did not figure out anything else that would be a causative organism she did have a CT scan and that revealed no evidence of pneumonia there was also no evidence of urinary tract infection. Again it very well could have been the wound called in those although wound appears to be doing fairly well at this point. 12/15/17 on evaluation today patient actually appears to be doing about the same in regard to her sacral ulcer. The one thing different is that she does seem to have a rash where her right arm is contracted and being held to the thorax. The area underlying both on the ventral side of the arm as well is the thorax where it comes in contact shows evidence of a rash which appears to be fungal in nature. There does not appear to be any evidence of infection lies at this point. There does not appear to be a rash consistent with shingles which was also of concern initially. No fevers, chills, nausea, or vomiting noted at this time. 12/29/17 on evaluation today patient appears to be doing a little better in regard to her sacral wound although the one changes the 12 o'clock location of the wound seems to have attached at one point which no longer allows this to pull back. This has caused an area of undermining that seems to be attaching as well in the 12 o'clock location. I do think that this  is something that can be managed and is not necessarily a bad thing. Nonetheless she does seem to have some pain with exploration of this region of undermining. She continues to have an area under her right arm on the chest wall of erythema although this is a little better especially after the home health nurse is actually got in order for Diflucan times one for the patient. She is a new area on the left wrist that looks like because of the contraction her nail may have pushed in on her wrist area causing a slight cut which subsequently became infected. She has some honey crusted drainage noted. 01/12/18 on evaluation today patient is seen with her caregiver in the office at this point. The patient's daughter who normally accompanies her was not present for the evaluation. With that being said in regard to her sacral wound things actually appear to be doing a little bit better with more adherence at the 12 o'clock location of the wound to the bed. There still does appear to be an area of undermining/tunneling at the 12 o'clock location very specifically which still does have some depth to it. Fortunately there does not appear to be any evidence of significant infection. Otherwise her rash under the right breast region seems to be doing better and in general the sores on her bilateral lower extremities are also improving we have been using Xeroform gauze of these locations. Her wrist ulcer on the left has actually healed. 01/26/18 on evaluation today patient's wounds of her lower extremities actually appear to be doing better and overall this is good news currently. In  fact these are all healed. It's mainly her sacral wound which is still open although it does show some signs of filling in in regard to the areas of undermining at this time. With all that being said I do still think that is gonna be some time before this wound will fully heal and I do believe that the hyper granular tissue may need to be  addressed at some point although right now I'm more interested in getting this to fill in before addressing the hyper granulation. 02/09/18 on evaluation today patient appears to be doing well in regard to her sacral pressure ulcer as well is her bilateral lower extremity ulcers which seem to be healed at this point. The sacral wound is the only one remaining and she does not seem to have any evidence of infection or pain this is doing excellent in my pinion. JYASIA, MARKOFF ([id number]) 02/23/18 on evaluation today patient appears to be doing rather well in regard to her sacral ulcer. She has been tolerating the dressing changes without complication and overall I do feel like there's new granulation noted this seems to be progressing nicely. With that being said the biggest issue that I see at this point is that the patient is having issues with two new openings on her legs as well as her left elbow were there slight skin tears. This is just due to the fragility of her skin and with normal rotational movement she is prone to having injury as such. Otherwise everything seems to be progressing nicely in my opinion. Patient History Unable to Obtain Patient History due to Altered Mental Status. Information obtained from Patient. Social History Never smoker, Marital Status - Married, Alcohol Use - Never, Drug Use - No History, Caffeine Use - Never. Medical And Surgical History Notes Ear/Nose/Mouth/Throat dysphagia Neurologic parkinsons, tremor Review of Systems (ROS) Constitutional Symptoms (General Health) Denies complaints or symptoms of Fever, Chills. Respiratory The patient has no complaints or symptoms. Cardiovascular The patient has no complaints or symptoms. Psychiatric The patient has no complaints or symptoms. Objective Constitutional Chronically ill appearing but in no apparent acute distress. Vitals Time Taken: 10:39 AM, Height: 60 in, Weight: 90 lbs, BMI: 17.6, Temperature:  98.1 F, Pulse: 71 bpm, Respiratory Rate: 18 breaths/min, Blood Pressure: 107/89 mmHg. Respiratory normal breathing without difficulty. clear to auscultation bilaterally. Cardiovascular regular rate and rhythm with normal S1, S2. Psychiatric this patient is able to make decisions and demonstrates good insight into disease process. Alert and Oriented x 3. pleasant and cooperative. JADIA, CAPERS ([id number]) General Notes: Currently patient's wound in regard to the sacral region shows good granulation there does not appear to be any evidence of infection which is good news. She does not seem to have any significant pain with cleansing of the area which is also good news. No sharp debridement was necessary in regard to the other new wounds that were noted today these were all very superficial skin tears which are likewise doing well. Integumentary (Hair, Skin) Wound #1 status is Open. Original cause of wound was Pressure Injury. The wound is located on the Midline Coccyx. The wound measures 4cm length x 2cm width x 0.3cm depth; 6.283cm^2 area and 1.885cm^3 volume. There is muscle and Fat Layer (Subcutaneous Tissue) Exposed exposed. There is undermining starting at 6:00 and ending at 8:00 with a maximum distance of 0.3cm. There is a large amount of serosanguineous drainage noted. Foul odor after cleansing was noted. The wound margin is flat and intact. There  is large (67-100%) red, hyper - granulation within the wound bed. There is a small (1-33%) amount of necrotic tissue within the wound bed including Adherent Slough. The periwound skin appearance exhibited: Induration, Maceration. The periwound skin appearance did not exhibit: Callus, Crepitus, Excoriation, Rash, Scarring, Dry/Scaly, Atrophie Blanche, Cyanosis, Ecchymosis, Hemosiderin Staining, Mottled, Pallor, Rubor, Erythema. Periwound temperature was noted as No Abnormality. The periwound has tenderness on palpation. Wound #7 status is  Open. Original cause of wound was Gradually Appeared. The wound is located on the Left,Anterior Lower Leg. The wound measures 2.1cm length x 1cm width x 0.1cm depth; 1.649cm^2 area and 0.165cm^3 volume. There is Fat Layer (Subcutaneous Tissue) Exposed exposed. There is no tunneling or undermining noted. There is a medium amount of sanguinous drainage noted. The wound margin is flat and intact. There is medium (34-66%) red granulation within the wound bed. There is a medium (34-66%) amount of necrotic tissue within the wound bed. The periwound skin appearance did not exhibit: Callus, Crepitus, Excoriation, Induration, Rash, Scarring, Dry/Scaly, Maceration, Atrophie Blanche, Cyanosis, Ecchymosis, Hemosiderin Staining, Mottled, Pallor, Rubor, Erythema. Periwound temperature was noted as No Abnormality. The periwound has tenderness on palpation. Wound #8 status is Open. Original cause of wound was Pressure Injury. The wound is located on the Right,Posterior Knee. The wound measures 0.5cm length x 1cm width x 0.1cm depth; 0.393cm^2 area and 0.039cm^3 volume. There is Fat Layer (Subcutaneous Tissue) Exposed exposed. There is no tunneling or undermining noted. There is a medium amount of serous drainage noted. The wound margin is flat and intact. There is large (67-100%) red granulation within the wound bed. There is a small (1-33%) amount of necrotic tissue within the wound bed. The periwound skin appearance did not exhibit: Callus, Crepitus, Excoriation, Induration, Rash, Scarring, Dry/Scaly, Maceration, Atrophie Blanche, Cyanosis, Ecchymosis, Hemosiderin Staining, Mottled, Pallor, Rubor, Erythema. Periwound temperature was noted as No Abnormality. Wound #9 status is Open. Original cause of wound was Trauma. The wound is located on the Left Elbow. The wound measures 1.1cm length x 1.4cm width x 0.1cm depth; 1.21cm^2 area and 0.121cm^3 volume. There is no tunneling or undermining noted. There is a medium  amount of serosanguineous drainage noted. The wound margin is distinct with the outline attached to the wound base. There is large (67-100%) red, pink granulation within the wound bed. There is no necrotic tissue within the wound bed. Periwound temperature was noted as No Abnormality. The periwound has tenderness on palpation. Assessment Active Problems ICD-10 L89.153 - Pressure ulcer of sacral region, stage 3 G20 - Parkinson's disease L03.312 - Cellulitis of back [any part except buttock] S81.811D - Laceration without foreign body, right lower leg, subsequent encounter ANATALIA, KRONK. ([id number]) Plan Wound Cleansing: Wound #1 Midline Coccyx: Clean wound with Normal Saline. May Shower, gently pat wound dry prior to applying new dressing. Wound #7 Left,Anterior Lower Leg: Clean wound with Normal Saline. May Shower, gently pat wound dry prior to applying new dressing. Wound #8 Right,Posterior Knee: Clean wound with Normal Saline. May Shower, gently pat wound dry prior to applying new dressing. Wound #9 Left Elbow: Clean wound with Normal Saline. May Shower, gently pat wound dry prior to applying new dressing. Anesthetic (add to Medication List): Wound #1 Midline Coccyx: Topical Lidocaine 4% cream applied to wound bed prior to debridement (In Clinic Only). Wound #7 Left,Anterior Lower Leg: Topical Lidocaine 4% cream applied to wound bed prior to debridement (In Clinic Only). Wound #8 Right,Posterior Knee: Topical Lidocaine 4% cream applied to wound bed prior  to debridement (In Clinic Only). Skin Barriers/Peri-Wound Care: Wound #1 Midline Coccyx: Skin Prep - use only where the allevyn sticks not on reddened areas Antifungal powder-Nystatin - around peri-wound on coccyx (do not get in wound), under right breast and right arm, right abdomen Primary Wound Dressing: Wound #1 Midline Coccyx: Silver Collagen - prisma ag and pack into undermining as well slightly moisten with  saline Wound #7 Left,Anterior Lower Leg: Xeroform Wound #8 Right,Posterior Knee: Xeroform Wound #9 Left Elbow: Xeroform Secondary Dressing: Wound #1 Midline Coccyx: Boardered Foam Dressing - HHRN PLEASE ORDER ALLEVYN LIFE BORDERED FOAM FOR PATIENT (OFF BRANDS IRRITATE HER SKIN) Drawtex - HHRN please order Wound #7 Left,Anterior Lower Leg: Conform/Kerlix Non-adherent pad - telfa Wound #8 Right,Posterior Knee: Conform/Kerlix Non-adherent pad - telfa Wound #9 Left Elbow: Conform/Kerlix Non-adherent pad - telfa Dressing Change Frequency: Wound #1 Midline Coccyx: Change dressing every day. Wound #7 Left,Anterior Lower Leg: Change dressing every day. Wound #8 Right,Posterior Knee: Change dressing every day. Wound #9 Left Elbow: Change dressing every day. [name], [name] ([id number]) Follow-up Appointments: Return Appointment in 2 weeks. Off-Loading: Wound #1 Midline Coccyx: Roho cushion for wheelchair Mattress - fluidized air mattress Turn and reposition every 2 hours Additional Orders / Instructions: Wound #1 Midline Coccyx: Increase protein intake. - please add protein supplements to patients diet Other: - please add vitamin A, vitamin C and zinc supplements to patients diet Home Health: Wound #1 Midline Coccyx: Continue Home Health Visits - [facility] Nurse may visit PRN to address patient s wound care needs. FACE TO FACE ENCOUNTER: MEDICARE and MEDICAID PATIENTS: I certify that this patient is under my care and that I had a face-to-face encounter that meets the physician face-to-face encounter requirements with this patient on this date. The encounter with the patient was in whole or in part for the following MEDICAL CONDITION: (primary reason for Home Healthcare) MEDICAL NECESSITY: I certify, that based on my findings, NURSING services are a medically necessary home health service. HOME BOUND STATUS: I certify that my clinical findings support that this  patient is homebound (i.e., Due to illness or injury, pt requires aid of supportive devices such as crutches, cane, wheelchairs, walkers, the use of special transportation or the assistance of another person to leave their place of residence. There is a normal inability to leave the home and doing so requires considerable and taxing effort. Other absences are for medical reasons / religious services and are infrequent or of short duration when for other reasons). If current dressing causes regression in wound condition, may D/C ordered dressing product/s and apply Normal Saline Moist Dressing daily until next Wound Healing Center / Other MD appointment. Notify Wound Healing Center of regression in wound condition at [phone]. Please direct any NON-WOUND related issues/requests for orders to patient's Primary Care Physician - Dr Vonita Moss Wound #7 Left,Anterior Lower Leg: Continue Home Health Visits - [facility] Nurse may visit PRN to address patient s wound care needs. FACE TO FACE ENCOUNTER: MEDICARE and MEDICAID PATIENTS: I certify that this patient is under my care and that I had a face-to-face encounter that meets the physician face-to-face encounter requirements with this patient on this date. The encounter with the patient was in whole or in part for the following MEDICAL CONDITION: (primary reason for Home Healthcare) MEDICAL NECESSITY: I certify, that based on my findings, NURSING services are a medically necessary home health service. HOME BOUND STATUS: I certify that my clinical findings support that this patient  is homebound (i.e., Due to illness or injury, pt requires aid of supportive devices such as crutches, cane, wheelchairs, walkers, the use of special transportation or the assistance of another person to leave their place of residence. There is a normal inability to leave the home and doing so requires considerable and taxing effort. Other absences are for  medical reasons / religious services and are infrequent or of short duration when for other reasons). If current dressing causes regression in wound condition, may D/C ordered dressing product/s and apply Normal Saline Moist Dressing daily until next Wound Healing Center / Other MD appointment. Notify Wound Healing Center of regression in wound condition at [phone]. Please direct any NON-WOUND related issues/requests for orders to patient's Primary Care Physician - Dr Vonita Moss Wound #8 Right,Posterior Knee: Continue Home Health Visits - [facility] Nurse may visit PRN to address patient s wound care needs. FACE TO FACE ENCOUNTER: MEDICARE and MEDICAID PATIENTS: I certify that this patient is under my care and that I had a face-to-face encounter that meets the physician face-to-face encounter requirements with this patient on this date. The encounter with the patient was in whole or in part for the following MEDICAL CONDITION: (primary reason for Home Healthcare) MEDICAL NECESSITY: I certify, that based on my findings, NURSING services are a medically necessary home health service. HOME BOUND STATUS: I certify that my clinical findings support that this patient is homebound (i.e., Due to illness or injury, pt requires aid of supportive devices such as crutches, cane, wheelchairs, walkers, the use of special transportation or the assistance of another person to leave their place of residence. There is a normal inability to leave the home and doing so requires considerable and taxing effort. Other absences are for medical reasons / religious services and are infrequent or of short duration when for other reasons). If current dressing causes regression in wound condition, may D/C ordered dressing product/s and apply Normal Saline Moist Dressing daily until next Wound Healing Center / Other MD appointment. Notify Wound Healing Center of regression in wound condition at  [phone]. Please direct any NON-WOUND related issues/requests for orders to patient's Primary Care Physician - Dr Vonita Moss [name], [name] ([id number]) Wound #9 Left Elbow: Continue Home Health Visits - [facility] Nurse may visit PRN to address patient s wound care needs. FACE TO FACE ENCOUNTER: MEDICARE and MEDICAID PATIENTS: I certify that this patient is under my care and that I had a face-to-face encounter that meets the physician face-to-face encounter requirements with this patient on this date. The encounter with the patient was in whole or in part for the following MEDICAL CONDITION: (primary reason for Home Healthcare) MEDICAL NECESSITY: I certify, that based on my findings, NURSING services are a medically necessary home health service. HOME BOUND STATUS: I certify that my clinical findings support that this patient is homebound (i.e., Due to illness or injury, pt requires aid of supportive devices such as crutches, cane, wheelchairs, walkers, the use of special transportation or the assistance of another person to leave their place of residence. There is a normal inability to leave the home and doing so requires considerable and taxing effort. Other absences are for medical reasons / religious services and are infrequent or of short duration when for other reasons). If current dressing causes regression in wound condition, may D/C ordered dressing product/s and apply Normal Saline Moist Dressing daily until next Wound Healing Center / Other MD appointment. Notify Wound Healing Center of  regression in wound condition at [phone]. Please direct any NON-WOUND related issues/requests for orders to patient's Primary Care Physician - Dr Vonita Moss The following medication(s) was prescribed: lidocaine topical 4 % cream 1 1 cream topical was prescribed at facility I'm going to suggest currently that we continue to utilize Xeroform for any skin tear area since she  seems to be doing so well. Subsequently we will continue with the [facility] for her sacral wound does this also seems to be doing well. I will see were things stand at follow-up. Please see above for specific wound care orders. We will see patient for re-evaluation in 2 week(s) here in the clinic. If anything worsens or changes patient will contact our office for additional recommendations. Electronic Signature(s) Signed: 02/24/2018 8:29:14 AM By: Lenda Kelp PA-C Entered By: Lenda Kelp on 02/23/2018 11:54:07 [name] ([id number]) -------------------------------------------------------------------------------- ROS/PFSH Details Patient Name: [name] Date of Service: 02/23/2018 10:15 AM Medical Record Number: [id number] Patient Account Number: [id number] Date of Birth/Sex: [birth year] (84 y.o. F) Treating RN: Phillis Haggis Primary Care Provider: Vonita Moss Other Clinician: Referring Provider: Vonita Moss Treating Provider/Extender: Linwood Dibbles, Chantay Whitelock Weeks in Treatment: 46 Unable to Obtain Patient History due to oo Altered Mental Status Information Obtained From Patient Wound History Do you currently have one or more open woundso Yes How many open wounds do you currently haveo 1 Approximately how long have you had your woundso 1 week How have you been treating your wound(s) until nowo santyl and gauze Has your wound(s) ever healed and then re-openedo No Have you had any lab work done in the past montho No Have you tested positive for an antibiotic resistant organism (MRSA, VRE)o No Have you tested positive for osteomyelitis (bone infection)o No Have you had any tests for circulation on your legso No Constitutional Symptoms (General Health) Complaints and Symptoms: Negative for: Fever; Chills Eyes Medical History: Negative for: Cataracts; Glaucoma; Optic Neuritis Ear/Nose/Mouth/Throat Medical History: Negative for: Chronic sinus problems/congestion;  Middle ear problems Past Medical History Notes: dysphagia Hematologic/Lymphatic Medical History: Positive for: Anemia Negative for: Hemophilia; Human Immunodeficiency Virus; Lymphedema; Sickle Cell Disease Respiratory Complaints and Symptoms: No Complaints or Symptoms Medical History: Negative for: Aspiration; Asthma; Chronic Obstructive Pulmonary Disease (COPD); Pneumothorax; Sleep Apnea; Tuberculosis Cardiovascular [name], [name]. ([id number]) Complaints and Symptoms: No Complaints or Symptoms Medical History: Positive for: Hypertension Negative for: Angina; Arrhythmia; Congestive Heart Failure; Coronary Artery Disease; Deep Vein Thrombosis; Hypotension; Myocardial Infarction; Peripheral Arterial Disease; Peripheral Venous Disease; Phlebitis; Vasculitis Gastrointestinal Medical History: Negative for: Cirrhosis ; Colitis; Crohnos; Hepatitis A; Hepatitis B; Hepatitis C Endocrine Medical History: Negative for: Type I Diabetes; Type II Diabetes Genitourinary Medical History: Negative for: End Stage Renal Disease Immunological Medical History: Negative for: Lupus Erythematosus; Raynaudos; Scleroderma Integumentary (Skin) Medical History: Negative for: History of Burn; History of pressure wounds Musculoskeletal Medical History: Negative for: Gout; Rheumatoid Arthritis; Osteoarthritis; Osteomyelitis Neurologic Medical History: Positive for: Dementia Negative for: Neuropathy; Quadriplegia; Paraplegia; Seizure Disorder Past Medical History Notes: parkinsons, tremor Oncologic Medical History: Negative for: Received Chemotherapy; Received Radiation Psychiatric Complaints and Symptoms: No Complaints or Symptoms Medical History: Negative for: Anorexia/bulimia; Confinement Anxiety PHEOBE, SANDIFORD ([id number]) Immunizations Pneumococcal Vaccine: Received Pneumococcal Vaccination: Yes Immunization Notes: up to date Implantable Devices Family and Social History Never  smoker; Marital Status - Married; Alcohol Use: Never; Drug Use: No History; Caffeine Use: Never; Financial Concerns: No; Food, Clothing or Shelter Needs: No; Support System Lacking: No; Transportation Concerns: No; Advanced Directives: Yes (Not  Provided); Patient does not want information on Advanced Directives; Medical Power of Attorney: Yes - dtr (Not Provided) Physician Affirmation I have reviewed and agree with the above information. Electronic Signature(s) Signed: 02/24/2018 8:29:14 AM By: Lenda Kelp PA-C Signed: 02/26/2018 5:38:20 PM By: Alejandro Mulling Entered By: Lenda Kelp on 02/23/2018 11:53:01 [name] ([id number]) -------------------------------------------------------------------------------- SuperBill Details Patient Name: [name] Date of Service: 02/23/2018 Medical Record Number: [id number] Patient Account Number: [id number] Date of Birth/Sex: 06/09/1923 (84 y.o. F) Treating RN: Phillis Haggis Primary Care Provider: Vonita Moss Other Clinician: Referring Provider: Vonita Moss Treating Provider/Extender: Linwood Dibbles, Dhruvi Crenshaw Weeks in Treatment: 58 Diagnosis Coding ICD-10 Codes Code Description L89.153 Pressure ulcer of sacral region, stage 3 G20 Parkinson's disease L03.312 Cellulitis of back [any part except buttock] S81.811D Laceration without foreign body, right lower leg, subsequent encounter Facility Procedures CPT4 Code: [id number] Description: 30865 - WOUND CARE VISIT-LEV 5 EST PT Modifier: Quantity: 1 Physician Procedures CPT4 Code: [id number] Description: 99213 - WC PHYS LEVEL 3 - EST PT ICD-10 Diagnosis Description L89.153 Pressure ulcer of sacral region, stage 3 G20 Parkinson's disease L03.312 Cellulitis of back [any part except buttock] S81.811D Laceration without foreign body, right lower  leg, subseque Modifier: nt encounter Quantity: 1 Electronic Signature(s) Signed: 02/24/2018 8:29:14 AM By: Lenda Kelp PA-C Entered By:  Lenda Kelp on 02/23/2018 11:54:27

## 2018-02-28 NOTE — Progress Notes (Signed)
[name], [name] ([id number]) Visit Report for 02/23/2018 Arrival Information Details Patient Name: [name], [name] Date of Service: 02/23/2018 10:15 AM Medical Record Number: [id number] Patient Account Number: [id number] Date of Birth/Sex: 1923/09/21 (84 y.o. F) Treating RN: Renne Crigler Primary Care Elaynah Virginia: Vonita Moss Other Clinician: Referring Deborrah Mabin: Vonita Moss Treating Ziyan Hillmer/Extender: Linwood Dibbles, HOYT Weeks in Treatment: 54 Visit Information History Since Last Visit All ordered tests and consults were completed: No Patient Arrived: Wheel Chair Added or deleted any medications: No Arrival Time: 10:39 Any new allergies or adverse reactions: No Accompanied By: daughter and caregiver Had a fall or experienced change in No activities of daily living that may affect Transfer Assistance: Manual risk of falls: Patient Identification Verified: Yes Signs or symptoms of abuse/neglect since last visito No Secondary Verification Process Yes Hospitalized since last visit: No Completed: Implantable device outside of the clinic excluding No Patient Requires Transmission-Based No cellular tissue based products placed in the center Precautions: since last visit: Patient Has Alerts: No Pain Present Now: No Electronic Signature(s) Signed: 02/23/2018 5:16:34 PM By: Renne Crigler Entered By: Renne Crigler on 02/23/2018 10:39:45 [name] ([id number]) -------------------------------------------------------------------------------- Clinic Level of Care Assessment Details Patient Name: [name] Date of Service: 02/23/2018 10:15 AM Medical Record Number: [id number] Patient Account Number: [id number] Date of Birth/Sex: 12-12-1922 (84 y.o. F) Treating RN: Phillis Haggis Primary Care Ilai Hiller: Vonita Moss Other Clinician: Referring Lynton Crescenzo: Vonita Moss Treating Lylee Corrow/Extender: Linwood Dibbles, HOYT Weeks in Treatment: 33 Clinic Level of Care Assessment  Items TOOL 4 Quantity Score X - Use when only an EandM is performed on FOLLOW-UP visit 1 0 ASSESSMENTS - Nursing Assessment / Reassessment X - Reassessment of Co-morbidities (includes updates in patient status) 1 10 X- 1 5 Reassessment of Adherence to Treatment Plan ASSESSMENTS - Wound and Skin Assessment / Reassessment []  - Simple Wound Assessment / Reassessment - one wound 0 X- 4 5 Complex Wound Assessment / Reassessment - multiple wounds []  - 0 Dermatologic / Skin Assessment (not related to wound area) ASSESSMENTS - Focused Assessment []  - Circumferential Edema Measurements - multi extremities 0 []  - 0 Nutritional Assessment / Counseling / Intervention []  - 0 Lower Extremity Assessment (monofilament, tuning fork, pulses) []  - 0 Peripheral Arterial Disease Assessment (using hand held doppler) ASSESSMENTS - Ostomy and/or Continence Assessment and Care []  - Incontinence Assessment and Management 0 []  - 0 Ostomy Care Assessment and Management (repouching, etc.) PROCESS - Coordination of Care []  - Simple Patient / Family Education for ongoing care 0 X- 1 20 Complex (extensive) Patient / Family Education for ongoing care X- 1 10 Staff obtains Chiropractor, Records, Test Results / Process Orders X- 1 10 Staff telephones HHA, Nursing Homes / Clarify orders / etc []  - 0 Routine Transfer to another Facility (non-emergent condition) []  - 0 Routine Hospital Admission (non-emergent condition) []  - 0 New Admissions / Manufacturing engineer / Ordering NPWT, Apligraf, etc. []  - 0 Emergency Hospital Admission (emergent condition) X- 1 10 Simple Discharge Coordination [name], [facility]. ([id number]) []  - 0 Complex (extensive) Discharge Coordination PROCESS - Special Needs []  - Pediatric / Minor Patient Management 0 []  - 0 Isolation Patient Management []  - 0 Hearing / Language / Visual special needs []  - 0 Assessment of Community assistance (transportation, D/C planning, etc.) []  -  0 Additional assistance / Altered mentation []  - 0 Support Surface(s) Assessment (bed, cushion, seat, etc.) INTERVENTIONS - Wound Cleansing / Measurement []  - Simple Wound Cleansing - one wound 0 X- 4 5  Complex Wound Cleansing - multiple wounds X- 1 5 Wound Imaging (photographs - any number of wounds) []  - 0 Wound Tracing (instead of photographs) []  - 0 Simple Wound Measurement - one wound X- 4 5 Complex Wound Measurement - multiple wounds INTERVENTIONS - Wound Dressings X - Small Wound Dressing one or multiple wounds 4 10 []  - 0 Medium Wound Dressing one or multiple wounds []  - 0 Large Wound Dressing one or multiple wounds X- 1 5 Application of Medications - topical []  - 0 Application of Medications - injection INTERVENTIONS - Miscellaneous []  - External ear exam 0 []  - 0 Specimen Collection (cultures, biopsies, blood, body fluids, etc.) []  - 0 Specimen(s) / Culture(s) sent or taken to Lab for analysis []  - 0 Patient Transfer (multiple staff / Nurse, adultHoyer Lift / Similar devices) []  - 0 Simple Staple / Suture removal (25 or less) []  - 0 Complex Staple / Suture removal (26 or more) []  - 0 Hypo / Hyperglycemic Management (close monitor of Blood Glucose) []  - 0 Ankle / Brachial Index (ABI) - do not check if billed separately X- 1 5 Vital Signs [name], [name] ([id number]) Has the patient been seen at the hospital within the last three years: Yes Total Score: 180 Level Of Care: New/Established - Level 5 Electronic Signature(s) Signed: 02/26/2018 5:38:20 PM By: Alejandro MullingPinkerton, Debra Entered By: Alejandro MullingPinkerton, Debra on 02/23/2018 11:48:45 [name], [name] ([id number]) -------------------------------------------------------------------------------- Encounter Discharge Information Details Patient Name: [name], [name] Date of Service: 02/23/2018 10:15 AM Medical Record Number: [id number] Patient Account Number: [id number] Date of Birth/Sex: Nov 06, 1922 (84 y.o. F) Treating RN:  Renne CriglerFlinchum, Cheryl Primary Care Ayaana Biondo: Vonita MossRISSMAN, MARK Other Clinician: Referring Tache Bobst: Vonita MossRISSMAN, MARK Treating Ishita Mcnerney/Extender: Linwood DibblesSTONE III, HOYT Weeks in Treatment: 6558 Encounter Discharge Information Items Discharge Condition: Stable Ambulatory Status: Wheelchair Discharge Destination: Home Transportation: Private Auto Schedule Follow-up Appointment: Yes Clinical Summary of Care: Electronic Signature(s) Signed: 02/23/2018 5:16:34 PM By: Renne CriglerFlinchum, Cheryl Entered By: Renne CriglerFlinchum, Cheryl on 02/23/2018 11:31:46 [name], [place] W. ([id number]) -------------------------------------------------------------------------------- Lower Extremity Assessment Details Patient Name: [name], Shanielle W. Date of Service: 02/23/2018 10:15 AM Medical Record Number: [id number] Patient Account Number: [id number] Date of Birth/Sex: Nov 06, 1922 (84 y.o. F) Treating RN: Renne CriglerFlinchum, Cheryl Primary Care Michela Herst: Vonita MossRISSMAN, MARK Other Clinician: Referring Sidhant Helderman: Vonita MossRISSMAN, MARK Treating Praise Stennett/Extender: Linwood DibblesSTONE III, HOYT Weeks in Treatment: 58 Edema Assessment Assessed: [Left: No] [Right: No] Edema: [Left: No] [Right: No] Vascular Assessment Claudication: Claudication Assessment [Left:None] [Right:None] Pulses: Dorsalis Pedis Palpable: [Left:Yes] [Right:Yes] Posterior Tibial Extremity colors, hair growth, and conditions: Extremity Color: [Left:Normal] [Right:Normal] Hair Growth on Extremity: [Left:No] [Right:No] Temperature of Extremity: [Left:Cool] [Right:Cool] Capillary Refill: [Left:> 3 seconds] [Right:> 3 seconds] Toe Nail Assessment Left: Right: Thick: No No Discolored: No No Deformed: No No Improper Length and Hygiene: No No Electronic Signature(s) Signed: 02/23/2018 5:16:34 PM By: Renne CriglerFlinchum, Cheryl Entered By: Renne CriglerFlinchum, Cheryl on 02/23/2018 10:58:55 [name], [name] ([id number]) -------------------------------------------------------------------------------- Multi Wound Chart Details Patient  Name: [name], [name] Date of Service: 02/23/2018 10:15 AM Medical Record Number: [id number] Patient Account Number: [id number] Date of Birth/Sex: Nov 06, 1922 (84 y.o. F) Treating RN: Phillis HaggisPinkerton, Debi Primary Care Arnel Wymer: Vonita MossRISSMAN, MARK Other Clinician: Referring Idara Woodside: Vonita MossRISSMAN, MARK Treating Reakwon Barren/Extender: Linwood DibblesSTONE III, HOYT Weeks in Treatment: 58 Vital Signs Height(in): 60 Pulse(bpm): 71 Weight(lbs): 90 Blood Pressure(mmHg): 107/89 Body Mass Index(BMI): 18 Temperature(F): 98.1 Respiratory Rate 18 (breaths/min): Photos: [1:No Photos] [7:No Photos] [8:No Photos] Wound Location: [1:Coccyx - Midline] [7:Left Lower Leg - Anterior] [8:Right Knee - Posterior] Wounding Event: [1:Pressure Injury] [7:Gradually Appeared] [8:Pressure Injury] Primary  Etiology: [1:Pressure Ulcer] [7:Pressure Ulcer] [8:Pressure Ulcer] Comorbid History: [1:Anemia, Hypertension, Dementia] [7:Anemia, Hypertension, Dementia] [8:Anemia, Hypertension, Dementia] Date Acquired: [1:12/01/2016] [7:02/18/2018] [8:02/12/2018] Weeks of Treatment: [1:58] [7:0] [8:0] Wound Status: [1:Open] [7:Open] [8:Open] Measurements L x W x D [1:4x2x0.3] [7:2.1x1x0.1] [8:0.5x1x0.1] (cm) Area (cm) : [1:6.283] [7:1.649] [8:0.393] Volume (cm) : [1:1.885] [7:0.165] [8:0.039] % Reduction in Area: [1:-51.20%] [7:N/A] [8:N/A] % Reduction in Volume: [1:-13.40%] [7:N/A] [8:N/A] Starting Position 1 [1:6] (o'clock): Ending Position 1 [1:8] (o'clock): Maximum Distance 1 (cm): [1:0.3] Undermining: [1:Yes] [7:No] [8:No] Classification: [1:Category/Stage IV] [7:Category/Stage II] [8:Category/Stage II] Exudate Amount: [1:Large] [7:Medium] [8:Medium] Exudate Type: [1:Serosanguineous] [7:Sanguinous] [8:Serous] Exudate Color: [1:red, brown] [7:red] [8:amber] Foul Odor After Cleansing: [1:Yes] [7:No] [8:No] Odor Anticipated Due to [1:No] [7:N/A] [8:N/A] Product Use: Wound Margin: [1:Flat and Intact] [7:Flat and Intact] [8:Flat and  Intact] Granulation Amount: [1:Large (67-100%)] [7:Medium (34-66%)] [8:Large (67-100%)] Granulation Quality: [1:Red, Hyper-granulation] [7:Red] [8:Red] Necrotic Amount: [1:Small (1-33%)] [7:Medium (34-66%)] [8:Small (1-33%)] Exposed Structures: [1:Fat Layer (Subcutaneous Tissue) Exposed: Yes Muscle: Yes Fascia: No Tendon: No] [7:Fat Layer (Subcutaneous Tissue) Exposed: Yes Fascia: No Tendon: No Muscle: No] [8:Fat Layer (Subcutaneous Tissue) Exposed: Yes Fascia: No Tendon: No Muscle: No] Joint: No Joint: No Joint: No Bone: No Bone: No Bone: No Epithelialization: None None Small (1-33%) Periwound Skin Texture: Induration: Yes Excoriation: No Excoriation: No Excoriation: No Induration: No Induration: No Callus: No Callus: No Callus: No Crepitus: No Crepitus: No Crepitus: No Rash: No Rash: No Rash: No Scarring: No Scarring: No Scarring: No Periwound Skin Moisture: Maceration: Yes Maceration: No Maceration: No Dry/Scaly: No Dry/Scaly: No Dry/Scaly: No Periwound Skin Color: Atrophie Blanche: No Atrophie Blanche: No Atrophie Blanche: No Cyanosis: No Cyanosis: No Cyanosis: No Ecchymosis: No Ecchymosis: No Ecchymosis: No Erythema: No Erythema: No Erythema: No Hemosiderin Staining: No Hemosiderin Staining: No Hemosiderin Staining: No Mottled: No Mottled: No Mottled: No Pallor: No Pallor: No Pallor: No Rubor: No Rubor: No Rubor: No Temperature: No Abnormality No Abnormality No Abnormality Tenderness on Palpation: Yes Yes No Wound Preparation: Ulcer Cleansing: Ulcer Cleansing: Ulcer Cleansing: Rinsed/Irrigated with Saline Rinsed/Irrigated with Saline Rinsed/Irrigated with Saline Topical Anesthetic Applied: Topical Anesthetic Applied: Topical Anesthetic Applied: Other: lidocaine 4% Other: lidocaine 4% Other: lidocaine 4% Treatment Notes Electronic Signature(s) Signed: 02/26/2018 5:38:20 PM By: Alejandro Mulling Entered By: Alejandro Mulling on 02/23/2018  11:03:03 [name] ([id number]) -------------------------------------------------------------------------------- Multi-Disciplinary Care Plan Details Patient Name: [name] Date of Service: 02/23/2018 10:15 AM Medical Record Number: [id number] Patient Account Number: [id number] Date of Birth/Sex: December 04, 1922 (84 y.o. F) Treating RN: Phillis Haggis Primary Care Marcelia Petersen: Vonita Moss Other Clinician: Referring Tinsleigh Slovacek: Vonita Moss Treating Allyna Pittsley/Extender: Linwood Dibbles, HOYT Weeks in Treatment: 92 Active Inactive ` Abuse / Safety / Falls / Self Care Management Nursing Diagnoses: Impaired physical mobility Potential for falls Goals: Patient will remain injury free Date Initiated: 01/07/2017 Target Resolution Date: 04/03/2017 Goal Status: Active Interventions: Assess fall risk on admission and as needed Notes: ` Nutrition Nursing Diagnoses: Potential for alteratiion in Nutrition/Potential for imbalanced nutrition Goals: Patient/caregiver agrees to and verbalizes understanding of need to use nutritional supplements and/or vitamins as prescribed Date Initiated: 01/07/2017 Target Resolution Date: 04/03/2017 Goal Status: Active Interventions: Assess patient nutrition upon admission and as needed per policy Notes: ` Orientation to the Wound Care Program Nursing Diagnoses: Knowledge deficit related to the wound healing center program Goals: Patient/caregiver will verbalize understanding of the Wound Healing Center Program Date Initiated: 01/07/2017 Target Resolution Date: 04/03/2017 Goal Status: Active [name], [name] ([id number]) Interventions: Provide education on orientation to  the wound center Notes: ` Pressure Nursing Diagnoses: Knowledge deficit related to causes and risk factors for pressure ulcer development Goals: Patient will remain free from development of additional pressure ulcers Date Initiated: 01/07/2017 Target Resolution Date: 04/03/2017 Goal  Status: Active Interventions: Assess potential for pressure ulcer upon admission and as needed Notes: ` Wound/Skin Impairment Nursing Diagnoses: Impaired tissue integrity Goals: Patient/caregiver will verbalize understanding of skin care regimen Date Initiated: 01/07/2017 Target Resolution Date: 04/03/2017 Goal Status: Active Ulcer/skin breakdown will have a volume reduction of 30% by week 4 Date Initiated: 01/07/2017 Target Resolution Date: 04/03/2017 Goal Status: Active Ulcer/skin breakdown will have a volume reduction of 50% by week 8 Date Initiated: 01/07/2017 Target Resolution Date: 04/03/2017 Goal Status: Active Ulcer/skin breakdown will have a volume reduction of 80% by week 12 Date Initiated: 01/07/2017 Target Resolution Date: 04/03/2017 Goal Status: Active Ulcer/skin breakdown will heal within 14 weeks Date Initiated: 01/07/2017 Target Resolution Date: 04/03/2017 Goal Status: Active Interventions: Assess patient/caregiver ability to obtain necessary supplies Assess patient/caregiver ability to perform ulcer/skin care regimen upon admission and as needed Assess ulceration(s) every visit Notes: Electronic Signature(s) [name], [name] ([id number]) Signed: 02/26/2018 5:38:20 PM By: Alejandro Mulling Entered By: Alejandro Mulling on 02/23/2018 11:02:42 [name] ([id number]) -------------------------------------------------------------------------------- Pain Assessment Details Patient Name: [name] Date of Service: 02/23/2018 10:15 AM Medical Record Number: [id number] Patient Account Number: [id number] Date of Birth/Sex: 18-May-1923 (84 y.o. F) Treating RN: Renne Crigler Primary Care Aubreanna Percle: Vonita Moss Other Clinician: Referring Deshay Blumenfeld: Vonita Moss Treating Dallen Bunte/Extender: Linwood Dibbles, HOYT Weeks in Treatment: 26 Active Problems Location of Pain Severity and Description of Pain Patient Has Paino No Site Locations Pain Management and  Medication Current Pain Management: Electronic Signature(s) Signed: 02/23/2018 5:16:34 PM By: Renne Crigler Entered By: Renne Crigler on 02/23/2018 10:39:53 [name] ([id number]) -------------------------------------------------------------------------------- Patient/Caregiver Education Details Patient Name: [name] Date of Service: 02/23/2018 10:15 AM Medical Record Number: [id number] Patient Account Number: [id number] Date of Birth/Gender: [birth year] (84 y.o. F) Treating RN: Renne Crigler Primary Care Physician: Vonita Moss Other Clinician: Referring Physician: Vonita Moss Treating Physician/Extender: Skeet Simmer in Treatment: 26 Education Assessment Education Provided To: Patient Education Topics Provided Wound/Skin Impairment: Handouts: Caring for Your Ulcer Methods: Explain/Verbal Responses: State content correctly Electronic Signature(s) Signed: 02/23/2018 5:16:34 PM By: Renne Crigler Entered By: Renne Crigler on 02/23/2018 11:32:24 [name] ([id number]) -------------------------------------------------------------------------------- Wound Assessment Details Patient Name: [name] Date of Service: 02/23/2018 10:15 AM Medical Record Number: [id number] Patient Account Number: [id number] Date of Birth/Sex: 10-06-1922 (84 y.o. F) Treating RN: Renne Crigler Primary Care Evellyn Tuff: Vonita Moss Other Clinician: Referring Rayn Shorb: Vonita Moss Treating Mieshia Pepitone/Extender: STONE III, HOYT Weeks in Treatment: 58 Wound Status Wound Number: 1 Primary Etiology: Pressure Ulcer Wound Location: Coccyx - Midline Wound Status: Open Wounding Event: Pressure Injury Comorbid History: Anemia, Hypertension, Dementia Date Acquired: 12/01/2016 Weeks Of Treatment: 58 Clustered Wound: No Photos Photo Uploaded By: Renne Crigler on 02/23/2018 16:31:29 Wound Measurements Length: (cm) 4 Width: (cm) 2 Depth: (cm)  0.3 Area: (cm) 6.283 Volume: (cm) 1.885 % Reduction in Area: -51.2% % Reduction in Volume: -13.4% Epithelialization: None Undermining: Yes Starting Position (o'clock): 6 Ending Position (o'clock): 8 Maximum Distance: (cm) 0.3 Wound Description Classification: Category/Stage IV Wound Margin: Flat and Intact Exudate Amount: Large Exudate Type: Serosanguineous Exudate Color: red, brown Foul Odor After Cleansing: Yes Due to Product Use: No Slough/Fibrino Yes Wound Bed Granulation Amount: Large (67-100%) Exposed Structure Granulation Quality: Red, Hyper-granulation Fascia Exposed: No Necrotic Amount:  Small (1-33%) Fat Layer (Subcutaneous Tissue) Exposed: Yes Necrotic Quality: Adherent Slough Tendon Exposed: No Muscle Exposed: Yes Necrosis of Muscle: No NAISHA, WISDOM ([id number]) Joint Exposed: No Bone Exposed: No Periwound Skin Texture Texture Color No Abnormalities Noted: No No Abnormalities Noted: No Callus: No Atrophie Blanche: No Crepitus: No Cyanosis: No Excoriation: No Ecchymosis: No Induration: Yes Erythema: No Rash: No Hemosiderin Staining: No Scarring: No Mottled: No Pallor: No Moisture Rubor: No No Abnormalities Noted: No Dry / Scaly: No Temperature / Pain Maceration: Yes Temperature: No Abnormality Tenderness on Palpation: Yes Wound Preparation Ulcer Cleansing: Rinsed/Irrigated with Saline Topical Anesthetic Applied: Other: lidocaine 4%, Treatment Notes Wound #1 (Midline Coccyx) 1. Cleansed with: Clean wound with Normal Saline 3. Peri-wound Care: Skin Prep 4. Dressing Applied: Prisma Ag Other dressing (specify in notes) 5. Secondary Dressing Applied Bordered Foam Dressing Notes drawtex Electronic Signature(s) Signed: 02/23/2018 5:16:34 PM By: Renne Crigler Entered By: Renne Crigler on 02/23/2018 10:50:24 [name] ([id number]) -------------------------------------------------------------------------------- Wound  Assessment Details Patient Name: [name] Date of Service: 02/23/2018 10:15 AM Medical Record Number: [id number] Patient Account Number: [id number] Date of Birth/Sex: 1923-01-12 (84 y.o. F) Treating RN: Renne Crigler Primary Care Rizwan Kuyper: Vonita Moss Other Clinician: Referring Tarig Zimmers: Vonita Moss Treating Leyda Vanderwerf/Extender: STONE III, HOYT Weeks in Treatment: 58 Wound Status Wound Number: 7 Primary Etiology: Pressure Ulcer Wound Location: Left Lower Leg - Anterior Wound Status: Open Wounding Event: Gradually Appeared Comorbid History: Anemia, Hypertension, Dementia Date Acquired: 02/18/2018 Weeks Of Treatment: 0 Clustered Wound: No Photos Photo Uploaded By: Renne Crigler on 02/23/2018 16:31:30 Wound Measurements Length: (cm) 2.1 Width: (cm) 1 Depth: (cm) 0.1 Area: (cm) 1.649 Volume: (cm) 0.165 % Reduction in Area: % Reduction in Volume: Epithelialization: None Tunneling: No Undermining: No Wound Description Classification: Category/Stage II Wound Margin: Flat and Intact Exudate Amount: Medium Exudate Type: Sanguinous Exudate Color: red Foul Odor After Cleansing: No Slough/Fibrino Yes Wound Bed Granulation Amount: Medium (34-66%) Exposed Structure Granulation Quality: Red Fascia Exposed: No Necrotic Amount: Medium (34-66%) Fat Layer (Subcutaneous Tissue) Exposed: Yes Tendon Exposed: No Muscle Exposed: No Joint Exposed: No Bone Exposed: No Periwound Skin Texture KATHLENE, YANO. ([id number]) Texture Color No Abnormalities Noted: No No Abnormalities Noted: No Callus: No Atrophie Blanche: No Crepitus: No Cyanosis: No Excoriation: No Ecchymosis: No Induration: No Erythema: No Rash: No Hemosiderin Staining: No Scarring: No Mottled: No Pallor: No Moisture Rubor: No No Abnormalities Noted: No Dry / Scaly: No Temperature / Pain Maceration: No Temperature: No Abnormality Tenderness on Palpation: Yes Wound Preparation Ulcer  Cleansing: Rinsed/Irrigated with Saline Topical Anesthetic Applied: Other: lidocaine 4%, Treatment Notes Wound #7 (Left, Anterior Lower Leg) 1. Cleansed with: Clean wound with Normal Saline 2. Anesthetic Topical Lidocaine 4% cream to wound bed prior to debridement 3. Peri-wound Care: Skin Prep 4. Dressing Applied: Xeroform 5. Secondary Dressing Applied Kerlix/Conform Non-Adherent pad Electronic Signature(s) Signed: 02/23/2018 5:16:34 PM By: Renne Crigler Entered By: Renne Crigler on 02/23/2018 10:54:46 [name] ([id number]) -------------------------------------------------------------------------------- Wound Assessment Details Patient Name: [name] Date of Service: 02/23/2018 10:15 AM Medical Record Number: [id number] Patient Account Number: [id number] Date of Birth/Sex: 1923-02-01 (84 y.o. F) Treating RN: Renne Crigler Primary Care Chamia Schmutz: Vonita Moss Other Clinician: Referring Lyllie Cobbins: Vonita Moss Treating Jadelynn Boylan/Extender: STONE III, HOYT Weeks in Treatment: 58 Wound Status Wound Number: 8 Primary Etiology: Pressure Ulcer Wound Location: Right Knee - Posterior Wound Status: Open Wounding Event: Pressure Injury Comorbid History: Anemia, Hypertension, Dementia Date Acquired: 02/12/2018 Weeks Of Treatment: 0 Clustered Wound: No Photos  Photo Uploaded By: Renne Crigler on 02/23/2018 17:08:55 Wound Measurements Length: (cm) 0.5 Width: (cm) 1 Depth: (cm) 0.1 Area: (cm) 0.393 Volume: (cm) 0.039 % Reduction in Area: % Reduction in Volume: Epithelialization: Small (1-33%) Tunneling: No Undermining: No Wound Description Classification: Category/Stage II Wound Margin: Flat and Intact Exudate Amount: Medium Exudate Type: Serous Exudate Color: amber Foul Odor After Cleansing: No Slough/Fibrino Yes Wound Bed Granulation Amount: Large (67-100%) Exposed Structure Granulation Quality: Red Fascia Exposed: No Necrotic Amount:  Small (1-33%) Fat Layer (Subcutaneous Tissue) Exposed: Yes Tendon Exposed: No Muscle Exposed: No Joint Exposed: No Bone Exposed: No Periwound Skin Texture SALLY-ANNE, WAMBLE. ([id number]) Texture Color No Abnormalities Noted: No No Abnormalities Noted: No Callus: No Atrophie Blanche: No Crepitus: No Cyanosis: No Excoriation: No Ecchymosis: No Induration: No Erythema: No Rash: No Hemosiderin Staining: No Scarring: No Mottled: No Pallor: No Moisture Rubor: No No Abnormalities Noted: No Dry / Scaly: No Temperature / Pain Maceration: No Temperature: No Abnormality Wound Preparation Ulcer Cleansing: Rinsed/Irrigated with Saline Topical Anesthetic Applied: Other: lidocaine 4%, Treatment Notes Wound #8 (Right, Posterior Knee) 1. Cleansed with: Clean wound with Normal Saline 2. Anesthetic Topical Lidocaine 4% cream to wound bed prior to debridement 3. Peri-wound Care: Skin Prep 4. Dressing Applied: Xeroform 5. Secondary Dressing Applied Kerlix/Conform Non-Adherent pad Electronic Signature(s) Signed: 02/23/2018 5:16:34 PM By: Renne Crigler Entered By: Renne Crigler on 02/23/2018 10:57:01 [name] ([id number]) -------------------------------------------------------------------------------- Wound Assessment Details Patient Name: [name] Date of Service: 02/23/2018 10:15 AM Medical Record Number: [id number] Patient Account Number: [id number] Date of Birth/Sex: Jul 22, 1923 (84 y.o. F) Treating RN: Phillis Haggis Primary Care Kyung Muto: Vonita Moss Other Clinician: Referring Andreika Vandagriff: Vonita Moss Treating Oluwateniola Leitch/Extender: STONE III, HOYT Weeks in Treatment: 58 Wound Status Wound Number: 9 Primary Etiology: Trauma, Other Wound Location: Left Elbow Wound Status: Open Wounding Event: Trauma Comorbid History: Anemia, Hypertension, Dementia Date Acquired: 02/16/2018 Weeks Of Treatment: 0 Clustered Wound: No Wound Measurements Length: (cm)  1.1 Width: (cm) 1.4 Depth: (cm) 0.1 Area: (cm) 1.21 Volume: (cm) 0.121 % Reduction in Area: % Reduction in Volume: Epithelialization: None Tunneling: No Undermining: No Wound Description Full Thickness Without Exposed Support Foul Od Classification: Structures Slough/ Wound Margin: Distinct, outline attached Exudate Medium Amount: Exudate Type: Serosanguineous Exudate Color: red, brown or After Cleansing: No Fibrino No Wound Bed Granulation Amount: Large (67-100%) Exposed Structure Granulation Quality: Red, Pink Fascia Exposed: No Necrotic Amount: None Present (0%) Fat Layer (Subcutaneous Tissue) Exposed: No Tendon Exposed: No Muscle Exposed: No Joint Exposed: No Bone Exposed: No Periwound Skin Texture Texture Color No Abnormalities Noted: No No Abnormalities Noted: No Moisture Temperature / Pain No Abnormalities Noted: No Temperature: No Abnormality Tenderness on Palpation: Yes Wound Preparation Ulcer Cleansing: Rinsed/Irrigated with Saline Topical Anesthetic Applied: None Treatment Notes AINARA, ELDRIDGE ([id number]) Wound #9 (Left Elbow) 1. Cleansed with: Clean wound with Normal Saline 2. Anesthetic Topical Lidocaine 4% cream to wound bed prior to debridement 3. Peri-wound Care: Skin Prep 4. Dressing Applied: Xeroform 5. Secondary Dressing Applied Kerlix/Conform Non-Adherent pad Electronic Signature(s) Signed: 02/26/2018 5:38:20 PM By: Alejandro Mulling Entered By: Alejandro Mulling on 02/23/2018 11:12:33 [name] ([id number]) -------------------------------------------------------------------------------- Vitals Details Patient Name: [name] Date of Service: 02/23/2018 10:15 AM Medical Record Number: [id number] Patient Account Number: [id number] Date of Birth/Sex: 17-Dec-1922 (84 y.o. F) Treating RN: Renne Crigler Primary Care Tadarrius Burch: Vonita Moss Other Clinician: Referring Zachariah Pavek: Vonita Moss Treating  Demani Mcbrien/Extender: STONE III, HOYT Weeks in Treatment: 58 Vital Signs Time Taken: 10:39 Temperature (F):  98.1 Height (in): 60 Pulse (bpm): 71 Weight (lbs): 90 Respiratory Rate (breaths/min): 18 Body Mass Index (BMI): 17.6 Blood Pressure (mmHg): 107/89 Reference Range: 80 - 120 mg / dl Electronic Signature(s) Signed: 02/23/2018 5:16:34 PM By: Renne Crigler Entered By: Renne Crigler on 02/23/2018 10:40:53

## 2018-03-04 ENCOUNTER — Telehealth: Payer: Self-pay | Admitting: Family Medicine

## 2018-03-04 NOTE — Telephone Encounter (Signed)
ok 

## 2018-03-04 NOTE — Telephone Encounter (Signed)
Copied from CRM [phone] [phone]. Topic: Quick Communication - See Telephone Encounter >> Mar 04, 2018 10:21 AM Windy KalataMichael, Kyisha Fowle L, NT wrote: CRM for notification. See Telephone encounter for: 03/04/18.  Sunny SchleinFelicia is a home health nurse for this patient and states that she gave a patient a Enema on 02/25/18. She states she had bowl movement for 2 hours straight that day. Sunny SchleinFelicia is calling today and states that the patient has not had a bowl movement for 7 days and she can hear bowl sounds. She would like to know if Dr. Dossie Arbourrissman would like for her to give another enema, she will be in the house 7130min-1hour. She also would like to know how Dr. Dossie Arbourrissman would like to proceed in regards to the patient not being able to have a bm on her own. Please contact.   Felicia Cb# [phone]

## 2018-03-05 NOTE — Telephone Encounter (Signed)
She also would like to know how Dr. Dossie Arbourrissman would like to proceed in regards to the patient not being able to have a bm on her own. Please contact.

## 2018-03-08 ENCOUNTER — Ambulatory Visit: Payer: Self-pay

## 2018-03-08 NOTE — Telephone Encounter (Signed)
Spoke with daughter who answered assessment questions. Pt has had constipation for the last 2 weeks. Pt only BM's have occurred after 2 enemas, 1 week apart. Pt takes daily Colace twice a day, Miralax or Senakot.  Pt's last BM was last Thursday. Pt is eating and drinking well. No nausea or abdominal pain noted by daughter. Care advice given. Appt made for Wednesday (no availability with PCP) with PCP. Care advice given per protocol.  Reason for Disposition . Last bowel movement (BM) > 4 days ago  Answer Assessment - Initial Assessment Questions 1. STOOL PATTERN OR FREQUENCY: "How often do you pass bowel movements (BMs)?"  (Normal range: tid to q 3 days)  "When was the last BM passed?"       Several a day last Thursday was last BM 2. STRAINING: "Do you have to strain to have a BM?"      no 3. RECTAL PAIN: "Does your rectum hurt when the stool comes out?" If so, ask: "Do you have hemorrhoids? How bad is the pain?"  (Scale 1-10; or mild, moderate, severe)     no 4. STOOL COMPOSITION: "Are the stools hard?"      Hard in the middle but soff on the outside last Thurday BM was soft  5. BLOOD ON STOOLS: "Has there been any blood on the toilet tissue or on the surface of the BM?" If so, ask: "When was the last time?"      no 6. CHRONIC CONSTIPATION: "Is this a new problem for you?"  If no, ask: How long have you had this problem?" (days, weeks, months)      Yes just the last weeks 7. CHANGES IN DIET: "Have there been any recent changes in your diet?"      no 8. MEDICATIONS: "Have you been taking any new medications?"     no 9. LAXATIVES: "Have you been using any laxatives or enemas?"  If yes, ask "What, how often, and when was the last time?"     Colace Miralax or senna and has had enemas x 2  10. CAUSE: "What do you think is causing the constipation?"        Does not know 11. OTHER SYMPTOMS: "Do you have any other symptoms?" (e.g., abdominal pain, fever, vomiting)       no 12. PREGNANCY: "Is  there any chance you are pregnant?" "When was your last menstrual period?"       n/a  Protocols used: CONSTIPATION-A-AH

## 2018-03-08 NOTE — Telephone Encounter (Signed)
Enema is okay and may do an enema once a week if needed.

## 2018-03-08 NOTE — Telephone Encounter (Signed)
Left message on machine for pt to return call to the office.  

## 2018-03-09 ENCOUNTER — Encounter: Payer: Medicare Other | Attending: Physician Assistant | Admitting: Physician Assistant

## 2018-03-09 DIAGNOSIS — L89153 Pressure ulcer of sacral region, stage 3: Secondary | ICD-10-CM | POA: Insufficient documentation

## 2018-03-09 DIAGNOSIS — L03312 Cellulitis of back [any part except buttock]: Secondary | ICD-10-CM | POA: Diagnosis not present

## 2018-03-09 DIAGNOSIS — F0280 Dementia in other diseases classified elsewhere without behavioral disturbance: Secondary | ICD-10-CM | POA: Insufficient documentation

## 2018-03-09 DIAGNOSIS — X58XXXA Exposure to other specified factors, initial encounter: Secondary | ICD-10-CM | POA: Diagnosis not present

## 2018-03-09 DIAGNOSIS — G20 Parkinson's disease: Secondary | ICD-10-CM | POA: Insufficient documentation

## 2018-03-09 DIAGNOSIS — L89023 Pressure ulcer of left elbow, stage 3: Secondary | ICD-10-CM | POA: Insufficient documentation

## 2018-03-09 DIAGNOSIS — S81811A Laceration without foreign body, right lower leg, initial encounter: Secondary | ICD-10-CM | POA: Diagnosis not present

## 2018-03-10 ENCOUNTER — Ambulatory Visit: Payer: Medicare Other | Admitting: Family Medicine

## 2018-03-10 ENCOUNTER — Encounter: Payer: Self-pay | Admitting: Family Medicine

## 2018-03-10 DIAGNOSIS — K5900 Constipation, unspecified: Secondary | ICD-10-CM | POA: Diagnosis not present

## 2018-03-10 DIAGNOSIS — G20 Parkinson's disease: Secondary | ICD-10-CM

## 2018-03-10 NOTE — Assessment & Plan Note (Signed)
Disc discussed patient care and treatment use of over-the-counter medications use of enemas have written orders for weekly as needed enema.

## 2018-03-10 NOTE — Assessment & Plan Note (Signed)
Patient with no outward signs of Parkinson's but with autonomic dysfunction having issues with constipation

## 2018-03-10 NOTE — Progress Notes (Signed)
BP (!) 145/65   Pulse 60   Temp (!) 97.1 F (36.2 C) (Oral)   SpO2 98%    Subjective:    Patient ID: [name], female    DOB: 28-Jan-1923, 84 y.o.   MRN: [id number]  HPI: [name] is a 84 y.o. female  Chief Complaint  Patient presents with  . Constipation    pt's daughter states that the patient has been having trouble with constipation for about 2 weeks. Have used Colace, miralax, senns, and a saline enema  Patient here positive in a wheelchair with contractures.  Assisted by caregiver and her daughter who provide history. Patient's biggest problem is constipation has had enema good results.  This needed to be repeated a week later.  Prior to enema patient developed some abdominal firmness.  Otherwise has been doing well. Maintains appetite and eats well. Reviewed constipation patient's having Colace 2 a day MiraLAX and took Pepto which did help. Previously written for enema every week on a as needed basis.  Relevant past medical, surgical, family and social history reviewed and updated as indicated. Interim medical history since our last visit reviewed. Allergies and medications reviewed and updated.  Review of Systems  Unable to perform ROS: Patient nonverbal    Per HPI unless specifically indicated above     Objective:    BP (!) 145/65   Pulse 60   Temp (!) 97.1 F (36.2 C) (Oral)   SpO2 98%   Wt Readings from Last 3 Encounters:  11/08/17 85 lb (38.6 kg)  10/13/16 96 lb 6.4 oz (43.7 kg)  06/23/16 110 lb (49.9 kg)    Physical Exam  Constitutional:  Patient with contractures in a wheelchair and a diaper.  Cardiovascular: Normal rate, regular rhythm and normal heart sounds.  Pulmonary/Chest: Effort normal and breath sounds normal.    Results for orders placed or performed during the hospital encounter of 11/08/17  Urine culture  Result Value Ref Range   Specimen Description      URINE, CATHETERIZED Performed at [facility] [address], [place], [place] [address]    Special Requests      NONE Performed at [facility], [address], [place], [place] [address]    Culture      NO GROWTH Performed at [facility], [address], [place], [place] [address]    Report Status 11/09/2017 FINAL   Culture, blood (routine x 2)  Result Value Ref Range   Specimen Description      BLOOD LT WRIST Performed at [facility], [address] [place], [place] [address]    Special Requests      BOTTLES DRAWN AEROBIC AND ANAEROBIC Blood Culture adequate volume Performed at [facility], [address] [place], [place] [address]    Culture  Setup Time      GRAM POSITIVE RODS AEROBIC BOTTLE ONLY CRITICAL RESULT CALLED TO, READ BACK BY AND VERIFIED WITH: JASON ROBBINS @ 2124 ON 11/10/2017 BY CAF Performed at [facility], [address] [place], [place] [address]    Culture PROPIONIBACTERIUM ACNES (A)    Report Status 11/13/2017 FINAL   Culture, blood (routine x 2)  Result Value Ref Range   Specimen Description      BLOOD LT [facility] Performed at [facility], [address] [place], [place] [address]    Special Requests      BOTTLES DRAWN AEROBIC AND ANAEROBIC Blood Culture adequate  volume Performed at [facility], [address] [place], [place] [address]    Culture  Setup Time      Organism ID to follow GRAM POSITIVE COCCI AEROBIC BOTTLE ONLY CRITICAL RESULT CALLED TO, READ BACK BY AND VERIFIED WITH: JASON ROBBINS AT 1610 11/09/17. MSS Performed at [facility], [address] [place], [place] [address]    Culture (A)     STAPHYLOCOCCUS SPECIES (COAGULASE NEGATIVE) THE SIGNIFICANCE OF ISOLATING THIS ORGANISM FROM A SINGLE SET OF BLOOD CULTURES WHEN MULTIPLE SETS ARE DRAWN IS UNCERTAIN. PLEASE NOTIFY THE MICROBIOLOGY DEPARTMENT WITHIN ONE WEEK IF SPECIATION AND SENSITIVITIES ARE REQUIRED. Performed at [facility], [address] [place], [place] [address]    Report Status 11/12/2017 FINAL   Blood Culture ID Panel (Reflexed)  Result Value Ref Range   Enterococcus species NOT DETECTED NOT DETECTED   Vancomycin resistance NOT DETECTED NOT DETECTED   Listeria monocytogenes NOT DETECTED NOT DETECTED   Staphylococcus species DETECTED (A) NOT DETECTED   Staphylococcus aureus NOT DETECTED NOT DETECTED   Methicillin resistance DETECTED (A) NOT DETECTED   Streptococcus species NOT DETECTED NOT DETECTED   Streptococcus agalactiae NOT DETECTED NOT DETECTED   Streptococcus pneumoniae NOT DETECTED NOT DETECTED   Streptococcus pyogenes NOT DETECTED NOT DETECTED   Acinetobacter baumannii NOT DETECTED NOT DETECTED   Enterobacteriaceae species NOT DETECTED NOT DETECTED   Enterobacter cloacae complex NOT DETECTED NOT DETECTED   Escherichia coli NOT DETECTED NOT DETECTED   Klebsiella oxytoca NOT DETECTED NOT DETECTED   Klebsiella pneumoniae NOT DETECTED NOT DETECTED   Proteus species NOT DETECTED NOT DETECTED   Serratia marcescens NOT DETECTED NOT DETECTED   Carbapenem resistance NOT DETECTED NOT DETECTED   Haemophilus influenzae NOT DETECTED NOT DETECTED   Neisseria meningitidis NOT DETECTED NOT DETECTED   Pseudomonas aeruginosa NOT DETECTED NOT DETECTED   Candida albicans NOT DETECTED NOT DETECTED   Candida glabrata NOT DETECTED NOT DETECTED   Candida krusei NOT DETECTED NOT DETECTED   Candida parapsilosis NOT DETECTED NOT DETECTED   Candida tropicalis NOT DETECTED NOT DETECTED  Culture, blood (single) w Reflex to ID Panel  Result Value Ref Range   Specimen Description BLOOD LEFT HAND    Special Requests      BOTTLES DRAWN AEROBIC AND ANAEROBIC Blood Culture adequate volume   Culture      NO GROWTH 5 DAYS Performed at [facility], [address] [place], [place] [address]    Report Status 11/15/2017 FINAL   Aerobic Culture (superficial specimen)  Result Value Ref Range   Specimen Description       SACRAL Performed at [facility], [address] [place], [place] [address]    Special Requests      Normal Performed at [facility], [address] [place], [place] [address]    Gram Stain      FEW WBC PRESENT, PREDOMINANTLY PMN NO SQUAMOUS EPITHELIAL CELLS SEEN RARE GRAM POSITIVE COCCI IN CLUSTERS Performed at [facility], [address] [place], [place] [address]    Culture RARE STAPHYLOCOCCUS AUREUS    Report Status 11/15/2017 FINAL    Organism ID, Bacteria STAPHYLOCOCCUS AUREUS       Susceptibility   Staphylococcus aureus - MIC*    CIPROFLOXACIN <=0.5 SENSITIVE Sensitive     ERYTHROMYCIN <=0.25 SENSITIVE Sensitive     GENTAMICIN <=0.5 SENSITIVE Sensitive     OXACILLIN 0.5 SENSITIVE Sensitive     TETRACYCLINE <=1 SENSITIVE  Sensitive     VANCOMYCIN <=0.5 SENSITIVE Sensitive     TRIMETH/SULFA <=10 SENSITIVE Sensitive     CLINDAMYCIN <=0.25 SENSITIVE Sensitive     RIFAMPIN <=0.5 SENSITIVE Sensitive     Inducible Clindamycin NEGATIVE Sensitive     * RARE STAPHYLOCOCCUS AUREUS  Blood Culture ID Panel (Reflexed)  Result Value Ref Range   Enterococcus species NOT DETECTED NOT DETECTED   Vancomycin resistance NOT DETECTED NOT DETECTED   Listeria monocytogenes NOT DETECTED NOT DETECTED   Staphylococcus species NOT DETECTED NOT DETECTED   Staphylococcus aureus NOT DETECTED NOT DETECTED   Methicillin resistance NOT DETECTED NOT DETECTED   Streptococcus species NOT DETECTED NOT DETECTED   Streptococcus agalactiae NOT DETECTED NOT DETECTED   Streptococcus pneumoniae NOT DETECTED NOT DETECTED   Streptococcus pyogenes NOT DETECTED NOT DETECTED   Acinetobacter baumannii NOT DETECTED NOT DETECTED   Enterobacteriaceae species NOT DETECTED NOT DETECTED   Enterobacter cloacae complex NOT DETECTED NOT DETECTED   Escherichia coli NOT DETECTED NOT DETECTED   Klebsiella oxytoca NOT DETECTED NOT DETECTED   Klebsiella pneumoniae NOT DETECTED NOT DETECTED    Proteus species NOT DETECTED NOT DETECTED   Serratia marcescens NOT DETECTED NOT DETECTED   Carbapenem resistance NOT DETECTED NOT DETECTED   Haemophilus influenzae NOT DETECTED NOT DETECTED   Neisseria meningitidis NOT DETECTED NOT DETECTED   Pseudomonas aeruginosa NOT DETECTED NOT DETECTED   Candida albicans NOT DETECTED NOT DETECTED   Candida glabrata NOT DETECTED NOT DETECTED   Candida krusei NOT DETECTED NOT DETECTED   Candida parapsilosis NOT DETECTED NOT DETECTED   Candida tropicalis NOT DETECTED NOT DETECTED  Comprehensive metabolic panel  Result Value Ref Range   Sodium 138 135 - 145 mmol/L   Potassium 3.7 3.5 - 5.1 mmol/L   Chloride 105 101 - 111 mmol/L   CO2 23 22 - 32 mmol/L   Glucose, Bld 154 (H) 65 - 99 mg/dL   BUN 19 6 - 20 mg/dL   Creatinine, Ser 4.09 (L) 0.44 - 1.00 mg/dL   Calcium 9.3 8.9 - 81.1 mg/dL   Total Protein 6.4 (L) 6.5 - 8.1 g/dL   Albumin 3.4 (L) 3.5 - 5.0 g/dL   AST 27 15 - 41 U/L   ALT 11 (L) 14 - 54 U/L   Alkaline Phosphatase 66 38 - 126 U/L   Total Bilirubin 0.9 0.3 - 1.2 mg/dL   GFR calc non Af Amer >60 >60 mL/min   GFR calc Af Amer >60 >60 mL/min   Anion gap 10 5 - 15  Troponin I  Result Value Ref Range   Troponin I <0.03 <0.03 ng/mL  Lactic acid, plasma  Result Value Ref Range   Lactic Acid, Venous 1.5 0.5 - 1.9 mmol/L  Lactic acid, plasma  Result Value Ref Range   Lactic Acid, Venous 1.2 0.5 - 1.9 mmol/L  CBC with Differential  Result Value Ref Range   WBC 15.1 (H) 3.6 - 11.0 K/uL   RBC 3.56 (L) 3.80 - 5.20 MIL/uL   Hemoglobin 11.2 (L) 12.0 - 16.0 g/dL   HCT 91.4 (L) 78.2 - 95.6 %   MCV 94.5 80.0 - 100.0 fL   MCH 31.5 26.0 - 34.0 pg   MCHC 33.3 32.0 - 36.0 g/dL   RDW 21.3 08.6 - 57.8 %   Platelets 249 150 - 440 K/uL   Neutrophils Relative % 90 %   Neutro Abs 13.5 (H) 1.4 - 6.5 K/uL   Lymphocytes Relative 3 %   Lymphs  Abs 0.5 (L) 1.0 - 3.6 K/uL   Monocytes Relative 7 %   Monocytes Absolute 1.1 (H) 0.2 - 0.9 K/uL   Eosinophils  Relative 0 %   Eosinophils Absolute 0.0 0 - 0.7 K/uL   Basophils Relative 0 %   Basophils Absolute 0.1 0 - 0.1 K/uL  Urinalysis, Complete w Microscopic  Result Value Ref Range   Color, Urine YELLOW (A) YELLOW   APPearance CLEAR (A) CLEAR   Specific Gravity, Urine 1.016 1.005 - 1.030   pH 6.0 5.0 - 8.0   Glucose, UA NEGATIVE NEGATIVE mg/dL   Hgb urine dipstick NEGATIVE NEGATIVE   Bilirubin Urine NEGATIVE NEGATIVE   Ketones, ur 5 (A) NEGATIVE mg/dL   Protein, ur NEGATIVE NEGATIVE mg/dL   Nitrite NEGATIVE NEGATIVE   Leukocytes, UA NEGATIVE NEGATIVE   RBC / HPF 0-5 0 - 5 RBC/hpf   WBC, UA 0-5 0 - 5 WBC/hpf   Bacteria, UA NONE SEEN NONE SEEN   Squamous Epithelial / LPF 0-5 (A) NONE SEEN   Mucus PRESENT    Hyaline Casts, UA PRESENT   Influenza panel by PCR (type A & B)  Result Value Ref Range   Influenza A By PCR NEGATIVE NEGATIVE   Influenza B By PCR NEGATIVE NEGATIVE  Basic metabolic panel  Result Value Ref Range   Sodium 139 135 - 145 mmol/L   Potassium 3.7 3.5 - 5.1 mmol/L   Chloride 107 101 - 111 mmol/L   CO2 23 22 - 32 mmol/L   Glucose, Bld 141 (H) 65 - 99 mg/dL   BUN 20 6 - 20 mg/dL   Creatinine, Ser 9.60 (L) 0.44 - 1.00 mg/dL   Calcium 9.0 8.9 - 45.4 mg/dL   GFR calc non Af Amer >60 >60 mL/min   GFR calc Af Amer >60 >60 mL/min   Anion gap 9 5 - 15  CBC  Result Value Ref Range   WBC 18.1 (H) 3.6 - 11.0 K/uL   RBC 3.11 (L) 3.80 - 5.20 MIL/uL   Hemoglobin 9.8 (L) 12.0 - 16.0 g/dL   HCT 09.8 (L) 11.9 - 14.7 %   MCV 94.5 80.0 - 100.0 fL   MCH 31.4 26.0 - 34.0 pg   MCHC 33.3 32.0 - 36.0 g/dL   RDW 82.9 56.2 - 13.0 %   Platelets 244 150 - 440 K/uL  Protime-INR  Result Value Ref Range   Prothrombin Time 14.6 11.4 - 15.2 seconds   INR 1.15   APTT  Result Value Ref Range   aPTT 30 24 - 36 seconds  Procalcitonin  Result Value Ref Range   Procalcitonin 0.52 ng/mL  CBC  Result Value Ref Range   WBC 7.4 3.6 - 11.0 K/uL   RBC 2.89 (L) 3.80 - 5.20 MIL/uL    Hemoglobin 9.2 (L) 12.0 - 16.0 g/dL   HCT 86.5 (L) 78.4 - 69.6 %   MCV 95.3 80.0 - 100.0 fL   MCH 31.8 26.0 - 34.0 pg   MCHC 33.4 32.0 - 36.0 g/dL   RDW 29.5 28.4 - 13.2 %   Platelets 196 150 - 440 K/uL  Basic metabolic panel  Result Value Ref Range   Sodium 140 135 - 145 mmol/L   Potassium 3.9 3.5 - 5.1 mmol/L   Chloride 112 (H) 101 - 111 mmol/L   CO2 22 22 - 32 mmol/L   Glucose, Bld 107 (H) 65 - 99 mg/dL   BUN 21 (H) 6 - 20 mg/dL   Creatinine,  Ser 0.54 0.44 - 1.00 mg/dL   Calcium 9.0 8.9 - 16.1 mg/dL   GFR calc non Af Amer >60 >60 mL/min   GFR calc Af Amer >60 >60 mL/min   Anion gap 6 5 - 15  Basic metabolic panel  Result Value Ref Range   Sodium 142 135 - 145 mmol/L   Potassium 4.0 3.5 - 5.1 mmol/L   Chloride 111 101 - 111 mmol/L   CO2 22 22 - 32 mmol/L   Glucose, Bld 113 (H) 65 - 99 mg/dL   BUN 21 (H) 6 - 20 mg/dL   Creatinine, Ser 0.96 0.44 - 1.00 mg/dL   Calcium 8.7 (L) 8.9 - 10.3 mg/dL   GFR calc non Af Amer >60 >60 mL/min   GFR calc Af Amer >60 >60 mL/min   Anion gap 9 5 - 15  Vancomycin, trough  Result Value Ref Range   Vancomycin Tr 51 (HH) 15 - 20 ug/mL  CBC  Result Value Ref Range   WBC 7.1 3.6 - 11.0 K/uL   RBC 3.05 (L) 3.80 - 5.20 MIL/uL   Hemoglobin 9.9 (L) 12.0 - 16.0 g/dL   HCT 04.5 (L) 40.9 - 81.1 %   MCV 93.9 80.0 - 100.0 fL   MCH 32.5 26.0 - 34.0 pg   MCHC 34.6 32.0 - 36.0 g/dL   RDW 91.4 78.2 - 95.6 %   Platelets 223 150 - 440 K/uL      Assessment & Plan:   Problem List Items Addressed This Visit      Nervous and Auditory   Parkinson's disease (HCC)    Patient with no outward signs of Parkinson's but with autonomic dysfunction having issues with constipation        Other   Constipation    Disc discussed patient care and treatment use of over-the-counter medications use of enemas have written orders for weekly as needed enema.          Follow up plan: Return if symptoms worsen or fail to improve, for As scheduled.

## 2018-03-12 NOTE — Progress Notes (Signed)
THESSALY, MCCULLERS ([id number]) Visit Report for 03/09/2018 Chief Complaint Document Details Patient Name: [name], [name] Date of Service: 03/09/2018 10:15 AM Medical Record Number: [id number] Patient Account Number: [id number] Date of Birth/Sex: 1923-02-20 (84 y.o. F) Treating RN: Phillis Haggis Primary Care Provider: Vonita Moss Other Clinician: Referring Provider: Vonita Moss Treating Provider/Extender: Linwood Dibbles, HOYT Weeks in Treatment: 60 Information Obtained from: Patient Chief Complaint patient is here for follow up evaluation of a sacral pressure ulcer Electronic Signature(s) Signed: 03/09/2018 5:32:46 PM By: Lenda Kelp PA-C Entered By: Lenda Kelp on 03/09/2018 16:49:29 [name] ([id number]) -------------------------------------------------------------------------------- HPI Details Patient Name: [name] Date of Service: 03/09/2018 10:15 AM Medical Record Number: [id number] Patient Account Number: [id number] Date of Birth/Sex: 1922/12/24 (84 y.o. F) Treating RN: Phillis Haggis Primary Care Provider: Vonita Moss Other Clinician: Referring Provider: Vonita Moss Treating Provider/Extender: Linwood Dibbles, HOYT Weeks in Treatment: 60 History of Present Illness HPI Description: 01/07/17 this is a 84 year old woman admitted to the clinic today for review of a pressure ulcer on her lower sacrum. She is referred from her primary physician's office after being seen on 3/22 with a 3 cm pressure area. Her daughter and caretaker accompanied her today state that the area first became obvious about a month ago and his since deteriorated. They have recently got [name] home health involved and have been using [name] to the wound. They have ordered a pressure relief surface for her mattress. They are turning her religiously. They state that she eats well and they've been forcing fluids on her. She is on a multivitamin. Looking through [facility] point last  albumin I see was 4.4 on 10/28. The patient has advanced parkinsonism which looks superficially like advanced Parkinson's disease although her daughter tells me she did not ever respond to Sinemet therefore this may have another pathology with signs of parkinsonism. However I think this is largely a mute point currently. She also has dementia and is nonambulatory. Since this started they have been keeping her in bed and turning her religiously every 2 hours. She lives at home in [place] with her husband with 24/7 care giving 01/13/17 santyl change qd. Still will require further debridement. continue santyl. 01/20/17; patient's wound actually looks some better less adherent necrotic surface. There is actually visible granulation. We're using [name] 01/27/17; better-looking surface but still a lot of necrotic tissue on the base of this wound. The periwound erythema is better than last week we are still using [name]. Her daughter tells [place] that she is still having trouble with the pressure-relief mattress through medical modalities 02/03/18; I had the patient scheduled for a two week followup however her daughter brought her in early concerned for discoloration on 2 areas of the wound circumference. We have bee using santyl 02/10/17;Better looking surface to the wound. Rim appears better suggesting better offloading. Using santyl 02/24/17; change to Silver Collegen last time. Wound appears better. 03/10/17; still using silver collagen religious offloading. Intake is satisfactory per her daughter. Dimension slightly better 03/12/2017 -- Dr. Jannetta Quint patient who had been seen 2 days ago and was doing fairly well. The patient is brought in by her daughter who noticed a new wound just above the previous wound on her sacral area and going on more to the left lateral side. She was very concerned and we asked her to get in for an opinion. 03/17/17; above is noted. The patient has developed a progressive area to the  left of her original wound. This  seems to this started with a ring of red skin with a more pale interior almost looking fungal. There was a rim of blister through part of the area although this did not look like zoster. They have been applying triamcinolone that was prescribed last week by [name] and the area has a fold to a linear band area which is confluent, erythematous and with obvious epidermal swelling but there is no overt tenderness or crepitus. She has lost some surface epithelium closer to the wound surface itself and now has a more superficial wound in this area and the extending erythema goes towards the left buttock. This is well demarcated between involved in normal skin but once again does not appear to be at all tender. If there is a contact issue here I cannot get the history out of the daughter or the caregiver that are with her. 03/24/17; the patient arrives today with the wound slightly worse slightly more drainage. The bandlike area of erythema that I treated as a possible pineal infection has improved somewhat although proximally is still has confluent erythema without overt tenderness. We have been using silver alginate since the most recent deterioration. To the bandlike degree of erythema we have been using Lotrisone cream 03/31/17; patient arrives today with the wound slightly larger, necrotic surface and surrounding erythema. The bandlike area of erythema that I treated as a possible pineal infection is less swollen but still present I've been using Lotrisone cream on that largely related to the presence of a tinea looking infection when this was first seen. We've been using silver alginate. X-ray that I ordered last week showed no acute bony abnormalities mild fecal impaction. Lab work showed a comprehensive metabolic panel that was normal including an albumin of 3.8. White count was 9.5 hemoglobin 12.3 differential count normal. C-reactive protein was less than 1 and  sedimentation rate and 17. The latter 2 values does not support an ongoing bacterial infection. 04/14/17; patient arrives after a 2 week hiatus. Her wound is not in good condition. Although the base of the wound looks Tina MainlandSHAW, Laterica W. ([id number]) stable she still has an erythematous area that was apparently blistered over the weekend. This again points to the left. As our intake nurse pointed out today this is in the area where the tissue folds together and we may need to prevent try to prevent this. Lab work and x-ray that I did to 3 weeks ago were unremarkable including her albumin nevertheless she is an extremely frail condition physically. We have have been using [name] 04/22/17; I changed her to silver alginate because of the surrounding maceration and moisture last week. The daughter did not like the way the wound looked in the middle of the week and changed her back to [place]. They're putting gauze on top of this. Thinks still using Lotrisone. 05/06/17 on evaluation today patient sacral wound appears to be doing okay and does not seem to be any worse. She is having no significant pain during evaluation today the secondary to mental status she is unable to rate or describe whether she had any pain she was not however flinching. Her daughter states that the wound does appear to be looking better to her. Still we are having difficulty with the skinfold that seems to be closing in on itself at this point. All in all I feel like she is making some good progress in the [name] seems to be the official for her. They do tell me that a refill if  we are gonna continue that today. No fevers, chills, nausea, or vomiting noted at this time. 05/13/17 presents today for evaluation concerning her ongoing sacral pressure ulcer. Unfortunately she also has an area of deep tissue injury in the right Ischial region which is starting to show up. The sacral wound also continues to show signs of necrotic tissue  overlying and has declined. Overall we really have not seen a significant improvement in the past several months in regard to the sacral wound and now patient is starting to develop a new wound in the right Ischial region. Obviously this is not trending in the direction that we want to see. No fevers, chills, nausea, or vomiting noted at this time. 05/19/17; I have not seen this wound and almost a month however there is nothing really positive to say about it. Necrotic tissue over the surface which superiorly I think abuts on her sacrum. She has surrounding erythema. I would be surprised if there is not underlying osteomyelitis or soft tissue infection. 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 She apparently eats well per description although I wonder about this looking at her. Lab work I did probably 4 weeks ago however was really quite normal including a serum albumin 05/26/17; culture I did last week grew Escherichia coli and methicillin sensitive staph aureus which should've been well covered by the Augmentin and ciprofloxacin. Indeed the erythema around the wound in the bed of the wound looks somewhat better. Her intake is still satisfactory. They now have a near fluidized bed 06/02/17; they completed the antibiotics last Friday. Using collagen. Daughter still reports eating and drinking well. There is less visualized erythema around the wound. 06/16/17; large pressure ulcer over her lower sacrum and coccyx. Using [name] to the wound bed. 06/30/17; certainly no change in dimensions of this large stage III wound over her sacrum and coccyx. They've been using [name]. There is no exposed bone. She has a candidal/tinea area in the right inguinal area. Other than that her daughter relates that she is eating and drinking well there changing her positioning to make sure the areas offloaded 07/14/17; no major change in the dimensions of this large stage III wound. Initially a smaller wound that  became secondarily infected causing significant tissue breakdown although it is been stable in the last several weeks. Tunneling superiorly at roughly 1:00 no change here either. There is no bone palpable. Both the patient's daughter and caretaker states that she eats well. I have not rechecked her blood work 07/27/17; patient arrives in clinic today and generally a deteriorated looking state. Mild fever with axillary temperature of 100.4. Daughter reports she has not been eating and drinking well since yesterday. She looks more pale and thin and less responsive. We have been using silver collagen to her wound 08/11/17; since the last time the patient was here things have gone better. Her fever went down and she started eating and drinking again. Culture I did of the wound showed methicillin sensitive staph aureus, Morganella and enterococcus. I only treated her with Keflex which would've not covered the Morganella and enterococcus however the purulent area on the 2:00 side of her wound is a lot better and the bandlike erythema that concern me also was resolved. I'd called the daughter last week to confirm that she was a lot better. She finished the Keflex last Thursday she also suffered a skin tear this morning perhaps while putting on her incontinence brief period is on the lateral aspect of  her right leg. Clean wound with the surface epithelium not viable. 08/25/17; last week the patient was noted to have erythema around the wound margin and a slight fever which the patient's daughter says was 50. Our office was contacted by home health however we did not have a space to work the patient in that she went to see her primary physician Dr. Maurice March. She was not febrile during this visit on 08/21/17 there was erythema around the wound similar to last occasion. Dr. Maurice March in reference to my culture from 07/28/17 and put her on doxycycline capsules which they're opening twice a day for 10 days. 09/17/17;  patient arrives today with the wound bed looking fairly well granulated. There is undermining from 4 to 6:00 although this seems to of contracted slightly. She does not have obvious infection although the daughter states there was some darkening of the wound circumference that is not evident today. They state she is eating well. They are concerned about oral thrush 09/30/16; very fibrin looking granulated wound bed. Her undermining from 4 to 6:00 is about the same but also appears to be well granulated. She has rolled edges of senescent tissue from roughly 7 to 12:00. There is no evidence of infection [name], [name] ([id number]) 10/13/17 on evaluation today patient appears to be doing fairly well all things considered in regard to her sacral wound. There's really not a lot of significant change or improvement she does have some evidence of contusion and deep tissue injury around the left border of the wound that patient's daughter did inquire about today. Nonetheless overall the wound appears to be doing about the same in my opinion. There is no significant indication of infection there also is no significant slough noted at this point. 10/27/17 She is here in follow up evaluation of a sacral ulcer. She is accompanied by her daughter and caregiver. There is red granulation tissue throughout, persistent discoloration to left border; this appears consistent with deep tissue injury. There are multiple areas covered in foam borders, tegaderm, etc that are "preventative" with "no wounds". We will continue with prisma and continue with two week follow ups 11/17/17 on evaluation today patient appears to be doing decently well in regard to her wound at this point. She continues to have a sacral wound ulcer. We see her roughly every two weeks. In the last week she was actually in the hospital due to what was diagnosed as sepsis although no organisms were ever identified in the actual blood cultures. The  hospital really was not sure that the wound was the cause of the infection but they really did not figure out anything else that would be a causative organism she did have a CT scan and that revealed no evidence of pneumonia there was also no evidence of urinary tract infection. Again it very well could have been the wound called in those although wound appears to be doing fairly well at this point. 12/15/17 on evaluation today patient actually appears to be doing about the same in regard to her sacral ulcer. The one thing different is that she does seem to have a rash where her right arm is contracted and being held to the thorax. The area underlying both on the ventral side of the arm as well is the thorax where it comes in contact shows evidence of a rash which appears to be fungal in nature. There does not appear to be any evidence of infection lies at this point. There does not  appear to be a rash consistent with shingles which was also of concern initially. No fevers, chills, nausea, or vomiting noted at this time. 12/29/17 on evaluation today patient appears to be doing a little better in regard to her sacral wound although the one changes the 12 o'clock location of the wound seems to have attached at one point which no longer allows this to pull back. This has caused an area of undermining that seems to be attaching as well in the 12 o'clock location. I do think that this is something that can be managed and is not necessarily a bad thing. Nonetheless she does seem to have some pain with exploration of this region of undermining. She continues to have an area under her right arm on the chest wall of erythema although this is a little better especially after the home health nurse is actually got in order for Diflucan times one for the patient. She is a new area on the left wrist that looks like because of the contraction her nail may have pushed in on her wrist area causing a slight cut which  subsequently became infected. She has some honey crusted drainage noted. 03/09/18 undervaluation today patient appears to be doing fairly well in regard to her wounds in general. She has been tolerating the dressing changes without complication. Overall I'm pleased with the progress that seems to be made week by week especially in regard to the sacral ulcer. Her daughter and the caregiver seem to take very good care of her. With that being said she has very little undermining in regard to the sacral wound in good granulation she also has good epithelialization noted. Electronic Signature(s) Signed: 03/09/2018 5:32:46 PM By: Lenda Kelp PA-C Entered By: Lenda Kelp on 03/09/2018 16:50:21 [name] ([id number]) -------------------------------------------------------------------------------- Physical Exam Details Patient Name: [name] Date of Service: 03/09/2018 10:15 AM Medical Record Number: [id number] Patient Account Number: [id number] Date of Birth/Sex: March 12, 1923 (84 y.o. F) Treating RN: Phillis Haggis Primary Care Provider: Vonita Moss Other Clinician: Referring Provider: Vonita Moss Treating Provider/Extender: STONE III, HOYT Weeks in Treatment: 60 Constitutional Chronically ill appearing but in no apparent acute distress. Respiratory normal breathing without difficulty. clear to auscultation bilaterally. Cardiovascular regular rate and rhythm with normal S1, S2. Psychiatric Patient is not able to cooperate in decision making regarding care. Patient has dementia. patient is confused. Notes In the immediate periwound vicinity there may be some evidence of a fungal rash noted at this point but nothing that appears to be two significant currently. Obviously this is good news. Nonetheless the patient seems to be doing fairly well does not seem to have any significant discomfort with cleansing over this area which is good news. No sharp debridement  performed at any site. Electronic Signature(s) Signed: 03/09/2018 5:32:46 PM By: Lenda Kelp PA-C Entered By: Lenda Kelp on 03/09/2018 16:51:10 [name] ([id number]) -------------------------------------------------------------------------------- Physician Orders Details Patient Name: [name] Date of Service: 03/09/2018 10:15 AM Medical Record Number: [id number] Patient Account Number: [id number] Date of Birth/Sex: 1923-09-29 (84 y.o. F) Treating RN: Phillis Haggis Primary Care Provider: Vonita Moss Other Clinician: Referring Provider: Vonita Moss Treating Provider/Extender: Linwood Dibbles, HOYT Weeks in Treatment: 71 Verbal / Phone Orders: Yes Clinician: Pinkerton, Debi Read Back and Verified: Yes Diagnosis Coding Wound Cleansing Wound #1 Midline Coccyx o Clean wound with Normal Saline. o May Shower, gently pat wound dry prior to applying new dressing. Wound #8 Right,Posterior Knee o Clean wound with  Normal Saline. o May Shower, gently pat wound dry prior to applying new dressing. Wound #9 Left Elbow o Clean wound with Normal Saline. o May Shower, gently pat wound dry prior to applying new dressing. Anesthetic (add to Medication List) Wound #1 Midline Coccyx o Topical Lidocaine 4% cream applied to wound bed prior to debridement (In Clinic Only). Wound #8 Right,Posterior Knee o Topical Lidocaine 4% cream applied to wound bed prior to debridement (In Clinic Only). Wound #9 Left Elbow o Topical Lidocaine 4% cream applied to wound bed prior to debridement (In Clinic Only). Skin Barriers/Peri-Wound Care Wound #1 Midline Coccyx o Skin Prep - use only where the allevyn sticks not on reddened areas o Antifungal powder-Nystatin - around peri-wound on coccyx (do not get in wound), under right breast and right arm, right abdomen Primary Wound Dressing Wound #1 Midline Coccyx o Silver Collagen - prisma ag and pack into undermining as  well slightly moisten with saline Wound #9 Left Elbow o Xeroform Secondary Dressing Wound #1 Midline Coccyx o Boardered Foam Dressing - HHRN PLEASE ORDER ALLEVYN LIFE BORDERED FOAM FOR PATIENT (OFF BRANDS IRRITATE HER SKIN) o Drawtex - HHRN please order Wound #8 Right,Posterior Knee Tina MainlandSHAW, Ronnita W. ([id number]) o Other - open to air Wound #9 Left Elbow o [place] Dressing Change Frequency Wound #1 Midline Coccyx o Change dressing every day. Wound #8 Right,Posterior Knee o Change dressing every day. Wound #9 Left Elbow o Change dressing every day. Follow-up Appointments o Return Appointment in 2 weeks. Off-Loading Wound #1 Midline Coccyx o Roho cushion for wheelchair o Mattress - fluidized air mattress o Turn and reposition every 2 hours Additional Orders / Instructions o Other: - HHRN - Right hip foam and telfa island change every other day Wound #1 Midline Coccyx o Increase protein intake. - please add protein supplements to patients diet o Other: - please add vitamin A, vitamin C and zinc supplements to patients diet Home Health Wound #1 Midline Coccyx o Continue Home Health Visits - [facility] o Home Health Nurse may visit PRN to address patientos wound care needs. o FACE TO FACE ENCOUNTER: MEDICARE and MEDICAID PATIENTS: I certify that this patient is under my care and that I had a face-to-face encounter that meets the physician face-to-face encounter requirements with this patient on this date. The encounter with the patient was in whole or in part for the following MEDICAL CONDITION: (primary reason for Home Healthcare) MEDICAL NECESSITY: I certify, that based on my findings, NURSING services are a medically necessary home health service. HOME BOUND STATUS: I certify that my clinical findings support that this patient is homebound (i.e., Due to illness or injury, pt requires aid of supportive devices such as crutches, cane,  wheelchairs, walkers, the use of special transportation or the assistance of another person to leave their place of residence. There is a normal inability to leave the home and doing so requires considerable and taxing effort. Other absences are for medical reasons / religious services and are infrequent or of short duration when for other reasons). o If current dressing causes regression in wound condition, may D/C ordered dressing product/s and apply Normal Saline Moist Dressing daily until next Wound Healing Center / Other MD appointment. Notify Wound Healing Center of regression in wound condition at [phone]. o Please direct any NON-WOUND related issues/requests for orders to patient's Primary Care Physician - Dr Vonita MossMark Crissman Wound #8 Right,Posterior Knee o [facility] o Home Health Nurse may visit  PRN to address patientos wound care needs. o FACE TO FACE ENCOUNTER: MEDICARE and MEDICAID PATIENTS: I certify that this patient is under my care and that I had a face-to-face encounter that meets the physician face-to-face encounter requirements with this patient on this date. The encounter with the patient was in whole or in part for the following MEDICAL [name], [name]. ([id number]) CONDITION: (primary reason for Home Healthcare) MEDICAL NECESSITY: I certify, that based on my findings, NURSING services are a medically necessary home health service. HOME BOUND STATUS: I certify that my clinical findings support that this patient is homebound (i.e., Due to illness or injury, pt requires aid of supportive devices such as crutches, cane, wheelchairs, walkers, the use of special transportation or the assistance of another person to leave their place of residence. There is a normal inability to leave the home and doing so requires considerable and taxing effort. Other absences are for medical reasons / religious services and are infrequent or of short  duration when for other reasons). o If current dressing causes regression in wound condition, may D/C ordered dressing product/s and apply Normal Saline Moist Dressing daily until next Wound Healing Center / Other MD appointment. Notify Wound Healing Center of regression in wound condition at [phone]. o Please direct any NON-WOUND related issues/requests for orders to patient's Primary Care Physician - Dr Vonita Moss Wound #9 Left Elbow o Continue Home Health Visits - [facility] o Home Health Nurse may visit PRN to address patientos wound care needs. o FACE TO FACE ENCOUNTER: MEDICARE and MEDICAID PATIENTS: I certify that this patient is under my care and that I had a face-to-face encounter that meets the physician face-to-face encounter requirements with this patient on this date. The encounter with the patient was in whole or in part for the following MEDICAL CONDITION: (primary reason for Home Healthcare) MEDICAL NECESSITY: I certify, that based on my findings, NURSING services are a medically necessary home health service. HOME BOUND STATUS: I certify that my clinical findings support that this patient is homebound (i.e., Due to illness or injury, pt requires aid of supportive devices such as crutches, cane, wheelchairs, walkers, the use of special transportation or the assistance of another person to leave their place of residence. There is a normal inability to leave the home and doing so requires considerable and taxing effort. Other absences are for medical reasons / religious services and are infrequent or of short duration when for other reasons). o If current dressing causes regression in wound condition, may D/C ordered dressing product/s and apply Normal Saline Moist Dressing daily until next Wound Healing Center / Other MD appointment. Notify Wound Healing Center of regression in wound condition at [phone]. o Please direct any NON-WOUND related  issues/requests for orders to patient's Primary Care Physician - Dr Vonita Moss Patient Medications Allergies: Phenergan, Sulfa (Sulfonamide Antibiotics) Notifications Medication Indication Start End lidocaine DOSE 1 - topical 4 % cream - 1 cream topical Electronic Signature(s) Signed: 03/09/2018 4:32:24 PM By: Alejandro Mulling Signed: 03/09/2018 5:32:46 PM By: Lenda Kelp PA-C Entered By: Alejandro Mulling on 03/09/2018 11:24:02 [name] ([id number]) -------------------------------------------------------------------------------- Prescription 03/09/2018 Patient Name: [name] Provider: Lenda Kelp PA-C Date of Birth: April 18, 1923 NPI#: [id number] Sex: F DEA#: [id number] Phone #: [phone] License #: Patient Address: [facility] [address] [facility] [place], [place] [address] [address] [place], [place] [address] [phone] Allergies Phenergan Sulfa (Sulfonamide Antibiotics) Medication Medication: Route:  Strength: Form: lidocaine 4 % topical cream topical 4% cream Class: TOPICAL LOCAL ANESTHETICS Dose: Frequency / Time: Indication: 1 1 cream topical Number of Refills: Number of Units: 0 Generic Substitution: Start Date: End Date: One Time Use: Substitution Permitted No Note to Pharmacy: Signature(s): Date(s): Electronic Signature(s) Signed: 03/09/2018 4:32:24 PM By: Alejandro Mulling Signed: 03/09/2018 5:32:46 PM By: Lenda Kelp PA-C Entered By: Alejandro Mulling on 03/09/2018 11:24:04 [name] ([id number]) [name] ([id number]) --------------------------------------------------------------------------------  Problem List Details Patient Name: [name] Date of Service: 03/09/2018 10:15 AM Medical Record Number: [id number] Patient Account Number: [id number] Date of Birth/Sex: [birth year] (84 y.o. F) Treating RN: Phillis Haggis Primary  Care Provider: Vonita Moss Other Clinician: Referring Provider: Vonita Moss Treating Provider/Extender: Linwood Dibbles, HOYT Weeks in Treatment: 60 Active Problems ICD-10 Impacting Encounter Code Description Active Date Wound Healing Diagnosis L89.153 Pressure ulcer of sacral region, stage 3 01/07/2017 No Yes G20 Parkinson's disease 01/07/2017 No Yes L03.312 Cellulitis of back [any part except buttock] 07/28/2017 No Yes S81.811D Laceration without foreign body, right lower leg, subsequent 08/11/2017 No Yes encounter Inactive Problems Resolved Problems Electronic Signature(s) Signed: 03/09/2018 5:32:46 PM By: Lenda Kelp PA-C Entered By: Lenda Kelp on 03/09/2018 16:49:24 [name] ([id number]) -------------------------------------------------------------------------------- Progress Note Details Patient Name: [name] Date of Service: 03/09/2018 10:15 AM Medical Record Number: [id number] Patient Account Number: [id number] Date of Birth/Sex: 23-Apr-1923 (84 y.o. F) Treating RN: Phillis Haggis Primary Care Provider: Vonita Moss Other Clinician: Referring Provider: Vonita Moss Treating Provider/Extender: Linwood Dibbles, HOYT Weeks in Treatment: 60 Subjective Chief Complaint Information obtained from Patient patient is here for follow up evaluation of a sacral pressure ulcer History of Present Illness (HPI) 01/07/17 this is a 84 year old woman admitted to the clinic today for review of a pressure ulcer on her lower sacrum. She is referred from her primary physician's office after being seen on 3/22 with a 3 cm pressure area. Her daughter and caretaker accompanied her today state that the area first became obvious about a month ago and his since deteriorated. They have recently got [name] a home health involved and have been using [name] to the wound. They have ordered a pressure relief surface for her mattress. They are turning her religiously. They state that  she eats well and they've been forcing fluids on her. She is on a multivitamin. Looking through [facility] point last albumin I see was 4.4 on 10/28. The patient has advanced parkinsonism which looks superficially like advanced Parkinson's disease although her daughter tells me she did not ever respond to Sinemet therefore this may have another pathology with signs of parkinsonism. However I think this is largely a mute point currently. She also has dementia and is nonambulatory. Since this started they have been keeping her in bed and turning her religiously every 2 hours. She lives at home in [place] with her husband with 24/7 care giving 01/13/17 santyl change qd. Still will require further debridement. continue santyl. 01/20/17; patient's wound actually looks some better less adherent necrotic surface. There is actually visible granulation. We're using [name] 01/27/17; better-looking surface but still a lot of necrotic tissue on the base of this wound. The periwound erythema is better than last week we are still using [name]. Her daughter tells [place] that she is still having trouble with the pressure-relief mattress through medical modalities 02/03/18; I had the patient scheduled for a two week followup however her daughter brought her in early concerned for discoloration on 2  areas of the wound circumference. We have bee using santyl 02/10/17;Better looking surface to the wound. Rim appears better suggesting better offloading. Using santyl 02/24/17; change to Silver Collegen last time. Wound appears better. 03/10/17; still using silver collagen religious offloading. Intake is satisfactory per her daughter. Dimension slightly better 03/12/2017 -- Dr. Jannetta Quint patient who had been seen 2 days ago and was doing fairly well. The patient is brought in by her daughter who noticed a new wound just above the previous wound on her sacral area and going on more to the left lateral side. She was very concerned  and we asked her to get in for an opinion. 03/17/17; above is noted. The patient has developed a progressive area to the left of her original wound. This seems to this started with a ring of red skin with a more pale interior almost looking fungal. There was a rim of blister through part of the area although this did not look like zoster. They have been applying triamcinolone that was prescribed last week by Dr. Meyer Russel and the area has a fold to a linear band area which is confluent, erythematous and with obvious epidermal swelling but there is no overt tenderness or crepitus. She has lost some surface epithelium closer to the wound surface itself and now has a more superficial wound in this area and the extending erythema goes towards the left buttock. This is well demarcated between involved in normal skin but once again does not appear to be at all tender. If there is a contact issue here I cannot get the history out of the daughter or the caregiver that are with her. 03/24/17; the patient arrives today with the wound slightly worse slightly more drainage. The bandlike area of erythema that I treated as a possible pineal infection has improved somewhat although proximally is still has confluent erythema without overt tenderness. We have been using silver alginate since the most recent deterioration. To the bandlike degree of erythema we have been using Lotrisone cream 03/31/17; patient arrives today with the wound slightly larger, necrotic surface and surrounding erythema. The bandlike area of erythema that I treated as a possible pineal infection is less swollen but still present I've been using Lotrisone cream on that largely related to the presence of a tinea looking infection when this was first seen. We've been using silver alginate. [name], [name] ([id number]) X-ray that I ordered last week showed no acute bony abnormalities mild fecal impaction. Lab work showed a comprehensive metabolic  panel that was normal including an albumin of 3.8. White count was 9.5 hemoglobin 12.3 differential count normal. C-reactive protein was less than 1 and sedimentation rate and 17. The latter 2 values does not support an ongoing bacterial infection. 04/14/17; patient arrives after a 2 week hiatus. Her wound is not in good condition. Although the base of the wound looks stable she still has an erythematous area that was apparently blistered over the weekend. This again points to the left. As our intake nurse pointed out today this is in the area where the tissue folds together and we may need to prevent try to prevent this. Lab work and x-ray that I did to 3 weeks ago were unremarkable including her albumin nevertheless she is an extremely frail condition physically. We have have been using [name] 04/22/17; I changed her to silver alginate because of the surrounding maceration and moisture last week. The daughter did not like the way the wound looked in the middle of the  week and changed her back to [place]. They're putting gauze on top of this. Thinks still using Lotrisone. 05/06/17 on evaluation today patient sacral wound appears to be doing okay and does not seem to be any worse. She is having no significant pain during evaluation today the secondary to mental status she is unable to rate or describe whether she had any pain she was not however flinching. Her daughter states that the wound does appear to be looking better to her. Still we are having difficulty with the skinfold that seems to be closing in on itself at this point. All in all I feel like she is making some good progress in the [name] seems to be the official for her. They do tell me that a refill if we are gonna continue that today. No fevers, chills, nausea, or vomiting noted at this time. 05/13/17 presents today for evaluation concerning her ongoing sacral pressure ulcer. Unfortunately she also has an area of deep tissue injury in the  right Ischial region which is starting to show up. The sacral wound also continues to show signs of necrotic tissue overlying and has declined. Overall we really have not seen a significant improvement in the past several months in regard to the sacral wound and now patient is starting to develop a new wound in the right Ischial region. Obviously this is not trending in the direction that we want to see. No fevers, chills, nausea, or vomiting noted at this time. 05/19/17; I have not seen this wound and almost a month however there is nothing really positive to say about it. Necrotic tissue over the surface which superiorly I think abuts on her sacrum. She has surrounding erythema. I would be surprised if there is not underlying osteomyelitis or soft tissue infection. 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 She apparently eats well per description although I wonder about this looking at her. Lab work I did probably 4 weeks ago however was really quite normal including a serum albumin 05/26/17; culture I did last week grew Escherichia coli and methicillin sensitive staph aureus which should've been well covered by the Augmentin and ciprofloxacin. Indeed the erythema around the wound in the bed of the wound looks somewhat better. Her intake is still satisfactory. They now have a near fluidized bed 06/02/17; they completed the antibiotics last Friday. Using collagen. Daughter still reports eating and drinking well. There is less visualized erythema around the wound. 06/16/17; large pressure ulcer over her lower sacrum and coccyx. Using [name] to the wound bed. 06/30/17; certainly no change in dimensions of this large stage III wound over her sacrum and coccyx. They've been using [name]. There is no exposed bone. She has a candidal/tinea area in the right inguinal area. Other than that her daughter relates that she is eating and drinking well there changing her positioning to make sure the areas  offloaded 07/14/17; no major change in the dimensions of this large stage III wound. Initially a smaller wound that became secondarily infected causing significant tissue breakdown although it is been stable in the last several weeks. Tunneling superiorly at roughly 1:00 no change here either. There is no bone palpable. Both the patient's daughter and caretaker states that she eats well. I have not rechecked her blood work 07/27/17; patient arrives in clinic today and generally a deteriorated looking state. Mild fever with axillary temperature of 100.4. Daughter reports she has not been eating and drinking well since yesterday. She looks more pale and  thin and less responsive. We have been using silver collagen to her wound 08/11/17; since the last time the patient was here things have gone better. Her fever went down and she started eating and drinking again. Culture I did of the wound showed methicillin sensitive staph aureus, Morganella and enterococcus. I only treated her with Keflex which would've not covered the Morganella and enterococcus however the purulent area on the 2:00 side of her wound is a lot better and the bandlike erythema that concern me also was resolved. I'd called the daughter last week to confirm that she was a lot better. She finished the Keflex last Thursday she also suffered a skin tear this morning perhaps while putting on her incontinence brief period is on the lateral aspect of her right leg. Clean wound with the surface epithelium not viable. 08/25/17; last week the patient was noted to have erythema around the wound margin and a slight fever which the patient's daughter says was 80. Our office was contacted by home health however we did not have a space to work the patient in that she went to see her primary physician Dr. Maurice March. She was not febrile during this visit on 08/21/17 there was erythema around the wound similar to last occasion. Dr. Maurice March in reference to my  culture from 07/28/17 and put her on doxycycline capsules which they're opening twice a day for 10 days. [name], [name] ([id number]) 09/17/17; patient arrives today with the wound bed looking fairly well granulated. There is undermining from 4 to 6:00 although this seems to of contracted slightly. She does not have obvious infection although the daughter states there was some darkening of the wound circumference that is not evident today. They state she is eating well. They are concerned about oral thrush 09/30/16; very fibrin looking granulated wound bed. Her undermining from 4 to 6:00 is about the same but also appears to be well granulated. She has rolled edges of senescent tissue from roughly 7 to 12:00. There is no evidence of infection 10/13/17 on evaluation today patient appears to be doing fairly well all things considered in regard to her sacral wound. There's really not a lot of significant change or improvement she does have some evidence of contusion and deep tissue injury around the left border of the wound that patient's daughter did inquire about today. Nonetheless overall the wound appears to be doing about the same in my opinion. There is no significant indication of infection there also is no significant slough noted at this point. 10/27/17 She is here in follow up evaluation of a sacral ulcer. She is accompanied by her daughter and caregiver. There is red granulation tissue throughout, persistent discoloration to left border; this appears consistent with deep tissue injury. There are multiple areas covered in foam borders, tegaderm, etc that are "preventative" with "no wounds". We will continue with prisma and continue with two week follow ups 11/17/17 on evaluation today patient appears to be doing decently well in regard to her wound at this point. She continues to have a sacral wound ulcer. We see her roughly every two weeks. In the last week she was actually in the hospital  due to what was diagnosed as sepsis although no organisms were ever identified in the actual blood cultures. The hospital really was not sure that the wound was the cause of the infection but they really did not figure out anything else that would be a causative organism she did have a CT scan and  that revealed no evidence of pneumonia there was also no evidence of urinary tract infection. Again it very well could have been the wound called in those although wound appears to be doing fairly well at this point. 12/15/17 on evaluation today patient actually appears to be doing about the same in regard to her sacral ulcer. The one thing different is that she does seem to have a rash where her right arm is contracted and being held to the thorax. The area underlying both on the ventral side of the arm as well is the thorax where it comes in contact shows evidence of a rash which appears to be fungal in nature. There does not appear to be any evidence of infection lies at this point. There does not appear to be a rash consistent with shingles which was also of concern initially. No fevers, chills, nausea, or vomiting noted at this time. 12/29/17 on evaluation today patient appears to be doing a little better in regard to her sacral wound although the one changes the 12 o'clock location of the wound seems to have attached at one point which no longer allows this to pull back. This has caused an area of undermining that seems to be attaching as well in the 12 o'clock location. I do think that this is something that can be managed and is not necessarily a bad thing. Nonetheless she does seem to have some pain with exploration of this region of undermining. She continues to have an area under her right arm on the chest wall of erythema although this is a little better especially after the home health nurse is actually got in order for Diflucan times one for the patient. She is a new area on the left wrist that  looks like because of the contraction her nail may have pushed in on her wrist area causing a slight cut which subsequently became infected. She has some honey crusted drainage noted. 03/09/18 undervaluation today patient appears to be doing fairly well in regard to her wounds in general. She has been tolerating the dressing changes without complication. Overall I'm pleased with the progress that seems to be made week by week especially in regard to the sacral ulcer. Her daughter and the caregiver seem to take very good care of her. With that being said she has very little undermining in regard to the sacral wound in good granulation she also has good epithelialization noted. Patient History Unable to Obtain Patient History due to Altered Mental Status. Information obtained from Patient. Social History Never smoker, Marital Status - Married, Alcohol Use - Never, Drug Use - No History, Caffeine Use - Never. Medical And Surgical History Notes Ear/Nose/Mouth/Throat dysphagia [name], [name] ([id number]) Neurologic parkinsons, tremor Review of Systems (ROS) Constitutional Symptoms (General Health) Denies complaints or symptoms of Fever, Chills. Respiratory The patient has no complaints or symptoms. Cardiovascular The patient has no complaints or symptoms. Psychiatric The patient has no complaints or symptoms. Objective Constitutional Chronically ill appearing but in no apparent acute distress. Vitals Time Taken: 10:23 AM, Height: 60 in, Weight: 90 lbs, BMI: 17.6, Temperature: 97.7 F, Pulse: 70 bpm, Respiratory Rate: 18 breaths/min, Blood Pressure: 154/84 mmHg. Respiratory normal breathing without difficulty. clear to auscultation bilaterally. Cardiovascular regular rate and rhythm with normal S1, S2. Psychiatric Patient is not able to cooperate in decision making regarding care. Patient has dementia. patient is confused. General Notes: In the immediate periwound vicinity there may  be some evidence of a fungal rash noted  at this point but nothing that appears to be two significant currently. Obviously this is good news. Nonetheless the patient seems to be doing fairly well does not seem to have any significant discomfort with cleansing over this area which is good news. No sharp debridement performed at any site. Integumentary (Hair, Skin) Wound #1 status is Open. Original cause of wound was Pressure Injury. The wound is located on the Midline Coccyx. The wound measures 4.5cm length x 1.2cm width x 0.5cm depth; 4.241cm^2 area and 2.121cm^3 volume. There is muscle and Fat Layer (Subcutaneous Tissue) Exposed exposed. There is no tunneling noted, however, there is undermining starting at 6:00 and ending at 10:00 with a maximum distance of 0.8cm. There is a large amount of serosanguineous drainage noted. Foul odor after cleansing was noted. The wound margin is flat and intact. There is large (67-100%) red, hyper - granulation within the wound bed. There is a small (1-33%) amount of necrotic tissue within the wound bed including Adherent Slough. The periwound skin appearance exhibited: Induration, Maceration. The periwound skin appearance did not exhibit: Callus, Crepitus, Excoriation, Rash, Scarring, Dry/Scaly, Atrophie Blanche, Cyanosis, Ecchymosis, Hemosiderin Staining, Mottled, Pallor, Rubor, Erythema. Periwound temperature was noted as No Abnormality. The periwound has tenderness on palpation. Wound #7 status is Healed - Epithelialized. Original cause of wound was Gradually Appeared. The wound is located on the Left,Anterior Lower Leg. The wound measures 0cm length x 0cm width x 0cm depth; 0cm^2 area and 0cm^3 volume. [name], [name] ([id number]) Wound #8 status is Open. Original cause of wound was Pressure Injury. The wound is located on the Right,Posterior Knee. The wound measures 0.5cm length x 0.5cm width x 0.1cm depth; 0.196cm^2 area and 0.02cm^3 volume. There is Fat  Layer (Subcutaneous Tissue) Exposed exposed. There is no tunneling or undermining noted. There is a none present amount of drainage noted. The wound margin is flat and intact. There is large (67-100%) red granulation within the wound bed. There is a small (1-33%) amount of necrotic tissue within the wound bed. The periwound skin appearance did not exhibit: Callus, Crepitus, Excoriation, Induration, Rash, Scarring, Dry/Scaly, Maceration, Atrophie Blanche, Cyanosis, Ecchymosis, Hemosiderin Staining, Mottled, Pallor, Rubor, Erythema. Periwound temperature was noted as No Abnormality. Wound #9 status is Open. Original cause of wound was Trauma. The wound is located on the Left Elbow. The wound measures 1.5cm length x 1cm width x 0.1cm depth; 1.178cm^2 area and 0.118cm^3 volume. There is no tunneling or undermining noted. There is a medium amount of serosanguineous drainage noted. The wound margin is distinct with the outline attached to the wound base. There is medium (34-66%) red, pink granulation within the wound bed. There is a medium (34-66%) amount of necrotic tissue within the wound bed including Adherent Slough. Periwound temperature was noted as No Abnormality. The periwound has tenderness on palpation. Assessment Active Problems ICD-10 Pressure ulcer of sacral region, stage 3 Parkinson's disease Cellulitis of back [any part except buttock] Laceration without foreign body, right lower leg, subsequent encounter Plan Wound Cleansing: Wound #1 Midline Coccyx: Clean wound with Normal Saline. May Shower, gently pat wound dry prior to applying new dressing. Wound #8 Right,Posterior Knee: Clean wound with Normal Saline. May Shower, gently pat wound dry prior to applying new dressing. Wound #9 Left Elbow: Clean wound with Normal Saline. May Shower, gently pat wound dry prior to applying new dressing. Anesthetic (add to Medication List): Wound #1 Midline Coccyx: Topical Lidocaine 4% cream  applied to wound bed prior to debridement (In Clinic Only).  Wound #8 Right,Posterior Knee: Topical Lidocaine 4% cream applied to wound bed prior to debridement (In Clinic Only). Wound #9 Left Elbow: Topical Lidocaine 4% cream applied to wound bed prior to debridement (In Clinic Only). Skin Barriers/Peri-Wound Care: Wound #1 Midline Coccyx: Skin Prep - use only where the allevyn sticks not on reddened areas Antifungal powder-Nystatin - around peri-wound on coccyx (do not get in wound), under right breast and right arm, right abdomen Primary Wound Dressing: [name], [name] ([id number]) Wound #1 Midline Coccyx: Silver Collagen - prisma ag and pack into undermining as well slightly moisten with saline Wound #9 Left Elbow: Xeroform Secondary Dressing: Wound #1 Midline Coccyx: Boardered Foam Dressing - HHRN PLEASE ORDER ALLEVYN LIFE BORDERED FOAM FOR PATIENT (OFF BRANDS IRRITATE HER SKIN) Drawtex - HHRN please order Wound #8 Right,Posterior Knee: Other - open to air Wound #9 Left Elbow: [place] Dressing Change Frequency: Wound #1 Midline Coccyx: Change dressing every day. Wound #8 Right,Posterior Knee: Change dressing every day. Wound #9 Left Elbow: Change dressing every day. Follow-up Appointments: Return Appointment in 2 weeks. Off-Loading: Wound #1 Midline Coccyx: Roho cushion for wheelchair Mattress - fluidized air mattress Turn and reposition every 2 hours Additional Orders / Instructions: Other: - HHRN - Right hip foam and [place] change every other day Wound #1 Midline Coccyx: Increase protein intake. - please add protein supplements to patients diet Other: - please add vitamin A, vitamin C and zinc supplements to patients diet Home Health: Wound #1 Midline Coccyx: Continue Home Health Visits - [facility] Nurse may visit PRN to address patient s wound care needs. FACE TO FACE ENCOUNTER: MEDICARE and MEDICAID PATIENTS: I certify that this patient  is under my care and that I had a face-to-face encounter that meets the physician face-to-face encounter requirements with this patient on this date. The encounter with the patient was in whole or in part for the following MEDICAL CONDITION: (primary reason for Home Healthcare) MEDICAL NECESSITY: I certify, that based on my findings, NURSING services are a medically necessary home health service. HOME BOUND STATUS: I certify that my clinical findings support that this patient is homebound (i.e., Due to illness or injury, pt requires aid of supportive devices such as crutches, cane, wheelchairs, walkers, the use of special transportation or the assistance of another person to leave their place of residence. There is a normal inability to leave the home and doing so requires considerable and taxing effort. Other absences are for medical reasons / religious services and are infrequent or of short duration when for other reasons). If current dressing causes regression in wound condition, may D/C ordered dressing product/s and apply Normal Saline Moist Dressing daily until next Wound Healing Center / Other MD appointment. Notify Wound Healing Center of regression in wound condition at [phone]. Please direct any NON-WOUND related issues/requests for orders to patient's Primary Care Physician - Dr Vonita Moss Wound #8 Right,Posterior Knee: Continue Home Health Visits - [facility] Health Nurse may visit PRN to address patient s wound care needs. FACE TO FACE ENCOUNTER: MEDICARE and MEDICAID PATIENTS: I certify that this patient is under my care and that I had a face-to-face encounter that meets the physician face-to-face encounter requirements with this patient on this date. The encounter with the patient was in whole or in part for the following MEDICAL CONDITION: (primary reason for Home Healthcare) MEDICAL NECESSITY: I certify, that based on my findings, NURSING services are a medically  necessary home health service. HOME BOUND  STATUS: I certify that my clinical findings support that this patient is homebound (i.e., Due to illness or injury, pt requires aid of supportive devices such as crutches, cane, wheelchairs, walkers, the use of special transportation or the assistance of another person to leave their place of residence. There is a normal inability to leave the home and doing so requires considerable and taxing effort. Other absences are for medical reasons / religious services and [name], [name]. ([id number]) are infrequent or of short duration when for other reasons). If current dressing causes regression in wound condition, may D/C ordered dressing product/s and apply Normal Saline Moist Dressing daily until next Wound Healing Center / Other MD appointment. Notify Wound Healing Center of regression in wound condition at [phone]. Please direct any NON-WOUND related issues/requests for orders to patient's Primary Care Physician - Dr Vonita Moss Wound #9 Left Elbow: Continue Home Health Visits - [facility] Nurse may visit PRN to address patient s wound care needs. FACE TO FACE ENCOUNTER: MEDICARE and MEDICAID PATIENTS: I certify that this patient is under my care and that I had a face-to-face encounter that meets the physician face-to-face encounter requirements with this patient on this date. The encounter with the patient was in whole or in part for the following MEDICAL CONDITION: (primary reason for Home Healthcare) MEDICAL NECESSITY: I certify, that based on my findings, NURSING services are a medically necessary home health service. HOME BOUND STATUS: I certify that my clinical findings support that this patient is homebound (i.e., Due to illness or injury, pt requires aid of supportive devices such as crutches, cane, wheelchairs, walkers, the use of special transportation or the assistance of another person to leave their place of residence. There  is a normal inability to leave the home and doing so requires considerable and taxing effort. Other absences are for medical reasons / religious services and are infrequent or of short duration when for other reasons). If current dressing causes regression in wound condition, may D/C ordered dressing product/s and apply Normal Saline Moist Dressing daily until next Wound Healing Center / Other MD appointment. Notify Wound Healing Center of regression in wound condition at [phone]. Please direct any NON-WOUND related issues/requests for orders to patient's Primary Care Physician - Dr Vonita Moss The following medication(s) was prescribed: lidocaine topical 4 % cream 1 1 cream topical was prescribed at facility Currently I am going to suggest that we continue with the Current wound care measures since the patient seems to be doing well at this time. We will see her for reevaluation in two weeks time to see were things stand. Please see above for specific wound care orders. We will see patient for re-evaluation in 2 week(s) here in the clinic. If anything worsens or changes patient will contact our office for additional recommendations. Electronic Signature(s) Signed: 03/09/2018 5:32:46 PM By: Lenda Kelp PA-C Entered By: Lenda Kelp on 03/09/2018 16:51:31 [name] ([id number]) -------------------------------------------------------------------------------- ROS/PFSH Details Patient Name: [name] Date of Service: 03/09/2018 10:15 AM Medical Record Number: [id number] Patient Account Number: [id number] Date of Birth/Sex: [birth year] (84 y.o. F) Treating RN: Phillis Haggis Primary Care Provider: Vonita Moss Other Clinician: Referring Provider: Vonita Moss Treating Provider/Extender: Linwood Dibbles, HOYT Weeks in Treatment: 62 Unable to Obtain Patient History due to oo Altered Mental Status Information Obtained From Patient Wound History Do you currently have one  or more open woundso Yes How many open wounds do you currently haveo 1 Approximately how long have  you had your woundso 1 week How have you been treating your wound(s) until nowo santyl and gauze Has your wound(s) ever healed and then re-openedo No Have you had any lab work done in the past montho No Have you tested positive for an antibiotic resistant organism (MRSA, VRE)o No Have you tested positive for osteomyelitis (bone infection)o No Have you had any tests for circulation on your legso No Constitutional Symptoms (General Health) Complaints and Symptoms: Negative for: Fever; Chills Eyes Medical History: Negative for: Cataracts; Glaucoma; Optic Neuritis Ear/Nose/Mouth/Throat Medical History: Negative for: Chronic sinus problems/congestion; Middle ear problems Past Medical History Notes: dysphagia Hematologic/Lymphatic Medical History: Positive for: Anemia Negative for: Hemophilia; Human Immunodeficiency Virus; Lymphedema; Sickle Cell Disease Respiratory Complaints and Symptoms: No Complaints or Symptoms Medical History: Negative for: Aspiration; Asthma; Chronic Obstructive Pulmonary Disease (COPD); Pneumothorax; Sleep Apnea; Tuberculosis Cardiovascular [name], [name]. ([id number]) Complaints and Symptoms: No Complaints or Symptoms Medical History: Positive for: Hypertension Negative for: Angina; Arrhythmia; Congestive Heart Failure; Coronary Artery Disease; Deep Vein Thrombosis; Hypotension; Myocardial Infarction; Peripheral Arterial Disease; Peripheral Venous Disease; Phlebitis; Vasculitis Gastrointestinal Medical History: Negative for: Cirrhosis ; Colitis; Crohnos; Hepatitis A; Hepatitis B; Hepatitis C Endocrine Medical History: Negative for: Type I Diabetes; Type II Diabetes Genitourinary Medical History: Negative for: End Stage Renal Disease Immunological Medical History: Negative for: Lupus Erythematosus; Raynaudos; Scleroderma Integumentary (Skin) Medical  History: Negative for: History of Burn; History of pressure wounds Musculoskeletal Medical History: Negative for: Gout; Rheumatoid Arthritis; Osteoarthritis; Osteomyelitis Neurologic Medical History: Positive for: Dementia Negative for: Neuropathy; Quadriplegia; Paraplegia; Seizure Disorder Past Medical History Notes: parkinsons, tremor Oncologic Medical History: Negative for: Received Chemotherapy; Received Radiation Psychiatric Complaints and Symptoms: No Complaints or Symptoms Medical History: Negative for: Anorexia/bulimia; Confinement Anxiety [name], [name] ([id number]) Immunizations Pneumococcal Vaccine: Received Pneumococcal Vaccination: Yes Immunization Notes: up to date Implantable Devices Family and Social History Never smoker; Marital Status - Married; Alcohol Use: Never; Drug Use: No History; Caffeine Use: Never; Financial Concerns: No; Food, Clothing or Shelter Needs: No; Support System Lacking: No; Transportation Concerns: No; Advanced Directives: Yes (Not Provided); Patient does not want information on Advanced Directives; Medical Power of Attorney: Yes - dtr (Not Provided) Physician Affirmation I have reviewed and agree with the above information. Electronic Signature(s) Signed: 03/09/2018 5:32:46 PM By: Lenda Kelp PA-C Signed: 03/10/2018 3:54:16 PM By: Alejandro Mulling Entered By: Lenda Kelp on 03/09/2018 16:50:50 [name] ([id number]) -------------------------------------------------------------------------------- SuperBill Details Patient Name: [name] Date of Service: 03/09/2018 Medical Record Number: [id number] Patient Account Number: [id number] Date of Birth/Sex: 1923-09-21 (84 y.o. F) Treating RN: Phillis Haggis Primary Care Provider: Vonita Moss Other Clinician: Referring Provider: Vonita Moss Treating Provider/Extender: Linwood Dibbles, HOYT Weeks in Treatment: 60 Diagnosis Coding ICD-10 Codes Code  Description L89.153 Pressure ulcer of sacral region, stage 3 G20 Parkinson's disease L03.312 Cellulitis of back [any part except buttock] S81.811D Laceration without foreign body, right lower leg, subsequent encounter Facility Procedures CPT4 Code: 24401027 Description: 99214 - WOUND CARE VISIT-LEV 4 EST PT Modifier: Quantity: 1 Physician Procedures CPT4 Code: 2536644 Description: 99213 - WC PHYS LEVEL 3 - EST PT ICD-10 Diagnosis Description L89.153 Pressure ulcer of sacral region, stage 3 G20 Parkinson's disease L03.312 Cellulitis of back [any part except buttock] S81.811D Laceration without foreign body, right lower  leg, subseque Modifier: nt encounter Quantity: 1 Electronic Signature(s) Signed: 03/09/2018 5:32:46 PM By: Lenda Kelp PA-C Entered By: Lenda Kelp on 03/09/2018 16:51:52

## 2018-03-12 NOTE — Progress Notes (Signed)
Tina, [name] ([id number]) Visit Report for 03/09/2018 Arrival Information Details Patient Name: [name], [name] Date of Service: 03/09/2018 10:15 AM Medical Record Number: [id number] Patient Account Number: [id number] Date of Birth/Sex: 06-20-1923 (84 y.o. F) Treating RN: Renne Crigler Primary Care Adisson Deak: Vonita Moss Other Clinician: Referring Makenly Larabee: Vonita Moss Treating Damont Balles/Extender: Linwood Dibbles, HOYT Weeks in Treatment: 60 Visit Information History Since Last Visit All ordered tests and consults were completed: No Patient Arrived: Wheel Chair Added or deleted any medications: No Arrival Time: 10:22 Any new allergies or adverse reactions: No Accompanied By: dtr Had a fall or experienced change in No activities of daily living that may affect Transfer Assistance: Manual risk of falls: Patient Identification Verified: Yes Signs or symptoms of abuse/neglect since last visito No Secondary Verification Process Completed: Yes Hospitalized since last visit: No Patient Requires Transmission-Based No Implantable device outside of the clinic excluding No Precautions: cellular tissue based products placed in the center Patient Has Alerts: No since last visit: Pain Present Now: No Electronic Signature(s) Signed: 03/09/2018 11:44:07 AM By: Renne Crigler Entered By: Renne Crigler on 03/09/2018 10:23:09 [name] ([id number]) -------------------------------------------------------------------------------- Clinic Level of Care Assessment Details Patient Name: [name] Date of Service: 03/09/2018 10:15 AM Medical Record Number: [id number] Patient Account Number: [id number] Date of Birth/Sex: 07/14/1923 (84 y.o. F) Treating RN: Phillis Haggis Primary Care Jazell Rosenau: Vonita Moss Other Clinician: Referring Pasha Gadison: Vonita Moss Treating Bernard Slayden/Extender: Linwood Dibbles, HOYT Weeks in Treatment: 60 Clinic Level of Care Assessment Items TOOL 4  Quantity Score X - Use when only an EandM is performed on FOLLOW-UP visit 1 0 ASSESSMENTS - Nursing Assessment / Reassessment X - Reassessment of Co-morbidities (includes updates in patient status) 1 10 X- 1 5 Reassessment of Adherence to Treatment Plan ASSESSMENTS - Wound and Skin Assessment / Reassessment []  - Simple Wound Assessment / Reassessment - one wound 0 X- 3 5 Complex Wound Assessment / Reassessment - multiple wounds []  - 0 Dermatologic / Skin Assessment (not related to wound area) ASSESSMENTS - Focused Assessment []  - Circumferential Edema Measurements - multi extremities 0 []  - 0 Nutritional Assessment / Counseling / Intervention []  - 0 Lower Extremity Assessment (monofilament, tuning fork, pulses) []  - 0 Peripheral Arterial Disease Assessment (using hand held doppler) ASSESSMENTS - Ostomy and/or Continence Assessment and Care []  - Incontinence Assessment and Management 0 []  - 0 Ostomy Care Assessment and Management (repouching, etc.) PROCESS - Coordination of Care []  - Simple Patient / Family Education for ongoing care 0 X- 1 20 Complex (extensive) Patient / Family Education for ongoing care X- 1 10 Staff obtains Chiropractor, Records, Test Results / Process Orders X- 1 10 Staff telephones HHA, Nursing Homes / Clarify orders / etc []  - 0 Routine Transfer to another Facility (non-emergent condition) []  - 0 Routine Hospital Admission (non-emergent condition) []  - 0 New Admissions / Manufacturing engineer / Ordering NPWT, Apligraf, etc. []  - 0 Emergency Hospital Admission (emergent condition) X- 1 10 Simple Discharge Coordination ATHA, MURADYAN. ([id number]) []  - 0 Complex (extensive) Discharge Coordination PROCESS - Special Needs []  - Pediatric / Minor Patient Management 0 []  - 0 Isolation Patient Management []  - 0 Hearing / Language / Visual special needs []  - 0 Assessment of Community assistance (transportation, D/C planning, etc.) []  - 0 Additional  assistance / Altered mentation []  - 0 Support Surface(s) Assessment (bed, cushion, seat, etc.) INTERVENTIONS - Wound Cleansing / Measurement []  - Simple Wound Cleansing - one wound 0 X- 3 5 Complex Wound  Cleansing - multiple wounds X- 1 5 Wound Imaging (photographs - any number of wounds) []  - 0 Wound Tracing (instead of photographs) []  - 0 Simple Wound Measurement - one wound X- 3 5 Complex Wound Measurement - multiple wounds INTERVENTIONS - Wound Dressings X - Small Wound Dressing one or multiple wounds 2 10 []  - 0 Medium Wound Dressing one or multiple wounds []  - 0 Large Wound Dressing one or multiple wounds X- 1 5 Application of Medications - topical []  - 0 Application of Medications - injection INTERVENTIONS - Miscellaneous []  - External ear exam 0 []  - 0 Specimen Collection (cultures, biopsies, blood, body fluids, etc.) []  - 0 Specimen(s) / Culture(s) sent or taken to Lab for analysis []  - 0 Patient Transfer (multiple staff / Nurse, adult / Similar devices) []  - 0 Simple Staple / Suture removal (25 or less) []  - 0 Complex Staple / Suture removal (26 or more) []  - 0 Hypo / Hyperglycemic Management (close monitor of Blood Glucose) []  - 0 Ankle / Brachial Index (ABI) - do not check if billed separately X- 1 5 Vital Signs Marian, Deamber W. ([id number]) Has the patient been seen at the hospital within the last three years: Yes Total Score: 145 Level Of Care: New/Established - Level 4 Electronic Signature(s) Signed: 03/09/2018 4:32:24 PM By: Alejandro Mulling Entered By: Alejandro Mulling on 03/09/2018 16:16:06 [name] ([id number]) -------------------------------------------------------------------------------- Encounter Discharge Information Details Patient Name: [name] Date of Service: 03/09/2018 10:15 AM Medical Record Number: [id number] Patient Account Number: [id number] Date of Birth/Sex: 05/09/1923 (84 y.o. F) Treating RN: Phillis Haggis Primary Care Kaison Mcparland: Vonita Moss Other Clinician: Referring Lyriq Jarchow: Vonita Moss Treating Hellen Shanley/Extender: Linwood Dibbles, HOYT Weeks in Treatment: 60 Encounter Discharge Information Items Discharge Condition: Stable Ambulatory Status: Wheelchair Discharge Destination: Home Transportation: Private Auto Accompanied By: daughter Schedule Follow-up Appointment: Yes Clinical Summary of Care: Electronic Signature(s) Signed: 03/09/2018 4:32:24 PM By: Alejandro Mulling Entered By: Alejandro Mulling on 03/09/2018 11:28:14 [name] ([id number]) -------------------------------------------------------------------------------- Lower Extremity Assessment Details Patient Name: [name] Date of Service: 03/09/2018 10:15 AM Medical Record Number: [id number] Patient Account Number: [id number] Date of Birth/Sex: 03-09-1923 (84 y.o. F) Treating RN: Renne Crigler Primary Care Lucio Litsey: Vonita Moss Other Clinician: Referring Isebella Upshur: Vonita Moss Treating Brizeyda Holtmeyer/Extender: Linwood Dibbles, HOYT Weeks in Treatment: 60 Electronic Signature(s) Signed: 03/09/2018 11:44:07 AM By: Renne Crigler Entered By: Renne Crigler on 03/09/2018 10:44:47 [name] ([id number]) -------------------------------------------------------------------------------- Multi Wound Chart Details Patient Name: [name] Date of Service: 03/09/2018 10:15 AM Medical Record Number: [id number] Patient Account Number: [id number] Date of Birth/Sex: 10/20/1922 (84 y.o. F) Treating RN: Phillis Haggis Primary Care Elika Godar: Vonita Moss Other Clinician: Referring Brooklen Runquist: Vonita Moss Treating Ylonda Storr/Extender: Linwood Dibbles, HOYT Weeks in Treatment: 60 Vital Signs Height(in): 60 Pulse(bpm): 70 Weight(lbs): 90 Blood Pressure(mmHg): 154/84 Body Mass Index(BMI): 18 Temperature(F): 97.7 Respiratory Rate 18 (breaths/min): Photos: [1:No Photos] [7:No Photos] [8:No Photos] Wound  Location: [1:Coccyx - Midline] [7:Left, Anterior Lower Leg] [8:Right Knee - Posterior] Wounding Event: [1:Pressure Injury] [7:Gradually Appeared] [8:Pressure Injury] Primary Etiology: [1:Pressure Ulcer] [7:Pressure Ulcer] [8:Pressure Ulcer] Comorbid History: [1:Anemia, Hypertension, Dementia] [7:N/A] [8:Anemia, Hypertension, Dementia] Date Acquired: [1:12/01/2016] [7:02/18/2018] [8:02/12/2018] Weeks of Treatment: [1:60] [7:2] [8:2] Wound Status: [1:Open] [7:Healed - Epithelialized] [8:Open] Measurements L x W x D [1:4.5x1.2x0.5] [7:0x0x0] [8:0.5x0.5x0.1] (cm) Area (cm) : [1:4.241] [7:0] [8:0.196] Volume (cm) : [1:2.121] [7:0] [8:0.02] % Reduction in Area: [1:-2.10%] [7:100.00%] [8:50.10%] % Reduction in Volume: [1:-27.60%] [7:100.00%] [8:48.70%] Starting Position 1 [1:6] (o'clock): Ending  Position 1 [1:10] (o'clock): Maximum Distance 1 (cm): [1:0.8] Undermining: [1:Yes] [7:N/A] [8:No] Classification: [1:Category/Stage IV] [7:Category/Stage II] [8:Category/Stage II] Exudate Amount: [1:Large] [7:N/A] [8:None Present] Exudate Type: [1:Serosanguineous] [7:N/A] [8:N/A] Exudate Color: [1:red, brown] [7:N/A] [8:N/A] Foul Odor After Cleansing: [1:Yes] [7:N/A] [8:No] Odor Anticipated Due to [1:No] [7:N/A] [8:N/A] Product Use: Wound Margin: [1:Flat and Intact] [7:N/A] [8:Flat and Intact] Granulation Amount: [1:Large (67-100%)] [7:N/A] [8:Large (67-100%)] Granulation Quality: [1:Red, Hyper-granulation] [7:N/A] [8:Red] Necrotic Amount: [1:Small (1-33%)] [7:N/A] [8:Small (1-33%)] Exposed Structures: [1:Fat Layer (Subcutaneous Tissue) Exposed: Yes Muscle: Yes Fascia: No Tendon: No] [7:N/A] [8:Fat Layer (Subcutaneous Tissue) Exposed: Yes Fascia: No Tendon: No Muscle: No] Joint: No Joint: No Bone: No Bone: No Epithelialization: None N/A Small (1-33%) Periwound Skin Texture: Induration: Yes No Abnormalities Noted Excoriation: No Excoriation: No Induration: No Callus: No Callus:  No Crepitus: No Crepitus: No Rash: No Rash: No Scarring: No Scarring: No Periwound Skin Moisture: Maceration: Yes No Abnormalities Noted Maceration: No Dry/Scaly: No Dry/Scaly: No Periwound Skin Color: Atrophie Blanche: No No Abnormalities Noted Atrophie Blanche: No Cyanosis: No Cyanosis: No Ecchymosis: No Ecchymosis: No Erythema: No Erythema: No Hemosiderin Staining: No Hemosiderin Staining: No Mottled: No Mottled: No Pallor: No Pallor: No Rubor: No Rubor: No Temperature: No Abnormality N/A No Abnormality Tenderness on Palpation: Yes No No Wound Preparation: Ulcer Cleansing: N/A Ulcer Cleansing: Rinsed/Irrigated with Saline Rinsed/Irrigated with Saline Topical Anesthetic Applied: Topical Anesthetic Applied: Other: lidocaine 4% None Wound Number: 9 N/A N/A Photos: No Photos N/A N/A Wound Location: Left Elbow N/A N/A Wounding Event: Trauma N/A N/A Primary Etiology: Trauma, Other N/A N/A Comorbid History: Anemia, Hypertension, N/A N/A Dementia Date Acquired: 02/16/2018 N/A N/A Weeks of Treatment: 2 N/A N/A Wound Status: Open N/A N/A Measurements L x W x D 1.5x1x0.1 N/A N/A (cm) Area (cm) : 1.178 N/A N/A Volume (cm) : 0.118 N/A N/A % Reduction in Area: 2.60% N/A N/A % Reduction in Volume: 2.50% N/A N/A Undermining: No N/A N/A Classification: Full Thickness Without N/A N/A Exposed Support Structures Exudate Amount: Medium N/A N/A Exudate Type: Serosanguineous N/A N/A Exudate Color: red, brown N/A N/A Foul Odor After Cleansing: No N/A N/A Odor Anticipated Due to N/A N/A N/A Product Use: Wound Margin: Distinct, outline attached N/A N/A Granulation Amount: Medium (34-66%) N/A N/A Granulation Quality: Red, Pink N/A N/A Necrotic Amount: Medium (34-66%) N/A N/A Exposed Structures: N/A N/A SIRIAH, TREAT ([id number]) Fascia: No Fat Layer (Subcutaneous Tissue) Exposed: No Tendon: No Muscle: No Joint: No Bone: No Epithelialization: None N/A  N/A Periwound Skin Texture: No Abnormalities Noted N/A N/A Periwound Skin Moisture: No Abnormalities Noted N/A N/A Periwound Skin Color: No Abnormalities Noted N/A N/A Temperature: No Abnormality N/A N/A Tenderness on Palpation: Yes N/A N/A Wound Preparation: Ulcer Cleansing: N/A N/A Rinsed/Irrigated with Saline Topical Anesthetic Applied: None, Other: lidocaine 4% Treatment Notes Electronic Signature(s) Signed: 03/09/2018 4:32:24 PM By: Alejandro Mulling Entered By: Alejandro Mulling on 03/09/2018 11:05:48 [name] ([id number]) -------------------------------------------------------------------------------- Multi-Disciplinary Care Plan Details Patient Name: [name] Date of Service: 03/09/2018 10:15 AM Medical Record Number: [id number] Patient Account Number: [id number] Date of Birth/Sex: May 21, 1923 (84 y.o. F) Treating RN: Phillis Haggis Primary Care Shaquandra Galano: Vonita Moss Other Clinician: Referring Davante Gerke: Vonita Moss Treating Doaa Kendzierski/Extender: Linwood Dibbles, HOYT Weeks in Treatment: 60 Active Inactive ` Abuse / Safety / Falls / Self Care Management Nursing Diagnoses: Impaired physical mobility Potential for falls Goals: Patient will remain injury free Date Initiated: 01/07/2017 Target Resolution Date: 04/03/2017 Goal Status: Active Interventions: Assess fall risk on admission and  as needed Notes: ` Nutrition Nursing Diagnoses: Potential for alteratiion in Nutrition/Potential for imbalanced nutrition Goals: Patient/caregiver agrees to and verbalizes understanding of need to use nutritional supplements and/or vitamins as prescribed Date Initiated: 01/07/2017 Target Resolution Date: 04/03/2017 Goal Status: Active Interventions: Assess patient nutrition upon admission and as needed per policy Notes: ` Orientation to the Wound Care Program Nursing Diagnoses: Knowledge deficit related to the wound healing center program Goals: Patient/caregiver will  verbalize understanding of the Wound Healing Center Program Date Initiated: 01/07/2017 Target Resolution Date: 04/03/2017 Goal Status: Active RENEKA, NEBERGALL ([id number]) Interventions: Provide education on orientation to the wound center Notes: ` Pressure Nursing Diagnoses: Knowledge deficit related to causes and risk factors for pressure ulcer development Goals: Patient will remain free from development of additional pressure ulcers Date Initiated: 01/07/2017 Target Resolution Date: 04/03/2017 Goal Status: Active Interventions: Assess potential for pressure ulcer upon admission and as needed Notes: ` Wound/Skin Impairment Nursing Diagnoses: Impaired tissue integrity Goals: Patient/caregiver will verbalize understanding of skin care regimen Date Initiated: 01/07/2017 Target Resolution Date: 04/03/2017 Goal Status: Active Ulcer/skin breakdown will have a volume reduction of 30% by week 4 Date Initiated: 01/07/2017 Target Resolution Date: 04/03/2017 Goal Status: Active Ulcer/skin breakdown will have a volume reduction of 50% by week 8 Date Initiated: 01/07/2017 Target Resolution Date: 04/03/2017 Goal Status: Active Ulcer/skin breakdown will have a volume reduction of 80% by week 12 Date Initiated: 01/07/2017 Target Resolution Date: 04/03/2017 Goal Status: Active Ulcer/skin breakdown will heal within 14 weeks Date Initiated: 01/07/2017 Target Resolution Date: 04/03/2017 Goal Status: Active Interventions: Assess patient/caregiver ability to obtain necessary supplies Assess patient/caregiver ability to perform ulcer/skin care regimen upon admission and as needed Assess ulceration(s) every visit Notes: Electronic Signature(s) CHRISTIANN, HAGERTY ([id number]) Signed: 03/09/2018 4:32:24 PM By: Alejandro Mulling Entered By: Alejandro Mulling on 03/09/2018 11:05:36 [name] ([id number]) -------------------------------------------------------------------------------- Pain Assessment  Details Patient Name: [name] Date of Service: 03/09/2018 10:15 AM Medical Record Number: [id number] Patient Account Number: [id number] Date of Birth/Sex: 1922/10/28 (84 y.o. F) Treating RN: Renne Crigler Primary Care Ellen Goris: Vonita Moss Other Clinician: Referring Trini Christiansen: Vonita Moss Treating Eleora Sutherland/Extender: Linwood Dibbles, HOYT Weeks in Treatment: 60 Active Problems Location of Pain Severity and Description of Pain Patient Has Paino Patient Unable to Respond Site Locations Rate the pain. Current Pain Level: 4 Pain Management and Medication Current Pain Management: Electronic Signature(s) Signed: 03/09/2018 11:44:07 AM By: Renne Crigler Entered By: Renne Crigler on 03/09/2018 10:23:28 [name] ([id number]) -------------------------------------------------------------------------------- Patient/Caregiver Education Details Patient Name: [name] Date of Service: 03/09/2018 10:15 AM Medical Record Number: [id number] Patient Account Number: [id number] Date of Birth/Gender: April 05, 1923 (84 y.o. F) Treating RN: Phillis Haggis Primary Care Physician: Vonita Moss Other Clinician: Referring Physician: Vonita Moss Treating Physician/Extender: Skeet Simmer in Treatment: 37 Education Assessment Education Provided To: Patient Education Topics Provided Wound/Skin Impairment: Handouts: Caring for Your Ulcer Methods: Explain/Verbal Responses: State content correctly Electronic Signature(s) Signed: 03/09/2018 4:32:24 PM By: Alejandro Mulling Entered By: Alejandro Mulling on 03/09/2018 11:28:34 [name] ([id number]) -------------------------------------------------------------------------------- Wound Assessment Details Patient Name: [name] Date of Service: 03/09/2018 10:15 AM Medical Record Number: [id number] Patient Account Number: [id number] Date of Birth/Sex: Sep 16, 1923 (84 y.o. F) Treating RN: Renne Crigler Primary Care Marylouise Mallet: Vonita Moss Other Clinician: Referring Cassandre Oleksy: Vonita Moss Treating Marzell Isakson/Extender: STONE III, HOYT Weeks in Treatment: 60 Wound Status Wound Number: 1 Primary Etiology: Pressure Ulcer Wound Location: Coccyx - Midline Wound Status: Open Wounding Event: Pressure Injury Comorbid History: Anemia,  Hypertension, Dementia Date Acquired: 12/01/2016 Weeks Of Treatment: 60 Clustered Wound: No Photos Photo Uploaded By: Renne Crigler on 03/09/2018 11:46:41 Wound Measurements Length: (cm) 4.5 Width: (cm) 1.2 Depth: (cm) 0.5 Area: (cm) 4.241 Volume: (cm) 2.121 % Reduction in Area: -2.1% % Reduction in Volume: -27.6% Epithelialization: None Tunneling: No Undermining: Yes Starting Position (o'clock): 6 Ending Position (o'clock): 10 Maximum Distance: (cm) 0.8 Wound Description Classification: Category/Stage IV Wound Margin: Flat and Intact Exudate Amount: Large Exudate Type: Serosanguineous Exudate Color: red, brown Foul Odor After Cleansing: Yes Due to Product Use: No Slough/Fibrino Yes Wound Bed Granulation Amount: Large (67-100%) Exposed Structure Granulation Quality: Red, Hyper-granulation Fascia Exposed: No Necrotic Amount: Small (1-33%) Fat Layer (Subcutaneous Tissue) Exposed: Yes Necrotic Quality: Adherent Slough Tendon Exposed: No Muscle Exposed: Yes SAMEKA, BAGENT. ([id number]) Necrosis of Muscle: No Joint Exposed: No Bone Exposed: No Periwound Skin Texture Texture Color No Abnormalities Noted: No No Abnormalities Noted: No Callus: No Atrophie Blanche: No Crepitus: No Cyanosis: No Excoriation: No Ecchymosis: No Induration: Yes Erythema: No Rash: No Hemosiderin Staining: No Scarring: No Mottled: No Pallor: No Moisture Rubor: No No Abnormalities Noted: No Dry / Scaly: No Temperature / Pain Maceration: Yes Temperature: No Abnormality Tenderness on Palpation: Yes Wound Preparation Ulcer Cleansing:  Rinsed/Irrigated with Saline Topical Anesthetic Applied: Other: lidocaine 4%, Treatment Notes Wound #1 (Midline Coccyx) 1. Cleansed with: Clean wound with Normal Saline 2. Anesthetic Topical Lidocaine 4% cream to wound bed prior to debridement 3. Peri-wound Care: Antifungal powder 4. Dressing Applied: Prisma Ag 5. Secondary Dressing Applied Bordered Foam Dressing Notes drawtex Electronic Signature(s) Signed: 03/09/2018 11:44:07 AM By: Renne Crigler Entered By: Renne Crigler on 03/09/2018 10:42:07 [name] ([id number]) -------------------------------------------------------------------------------- Wound Assessment Details Patient Name: [name] Date of Service: 03/09/2018 10:15 AM Medical Record Number: [id number] Patient Account Number: [id number] Date of Birth/Sex: 01-10-1923 (84 y.o. F) Treating RN: Renne Crigler Primary Care Rossana Molchan: Vonita Moss Other Clinician: Referring Ishaan Villamar: Vonita Moss Treating Laster Appling/Extender: STONE III, HOYT Weeks in Treatment: 60 Wound Status Wound Number: 7 Primary Etiology: Pressure Ulcer Wound Location: Left, Anterior Lower Leg Wound Status: Healed - Epithelialized Wounding Event: Gradually Appeared Date Acquired: 02/18/2018 Weeks Of Treatment: 2 Clustered Wound: No Photos Photo Uploaded By: Renne Crigler on 03/09/2018 11:46:42 Wound Measurements Length: (cm) 0 Width: (cm) 0 Depth: (cm) 0 Area: (cm) 0 Volume: (cm) 0 % Reduction in Area: 100% % Reduction in Volume: 100% Wound Description Classification: Category/Stage II Periwound Skin Texture Texture Color No Abnormalities Noted: No No Abnormalities Noted: No Moisture No Abnormalities Noted: No Electronic Signature(s) Signed: 03/09/2018 11:44:07 AM By: Renne Crigler Entered By: Renne Crigler on 03/09/2018 10:28:39 [name]  ([id number]) -------------------------------------------------------------------------------- Wound Assessment Details Patient Name: [name] Date of Service: 03/09/2018 10:15 AM Medical Record Number: [id number] Patient Account Number: [id number] Date of Birth/Sex: 04-Dec-1922 (84 y.o. F) Treating RN: Renne Crigler Primary Care Jaslyne Beeck: Vonita Moss Other Clinician: Referring Osmel Dykstra: Vonita Moss Treating Kalayna Noy/Extender: STONE III, HOYT Weeks in Treatment: 60 Wound Status Wound Number: 8 Primary Etiology: Pressure Ulcer Wound Location: Right Knee - Posterior Wound Status: Open Wounding Event: Pressure Injury Comorbid History: Anemia, Hypertension, Dementia Date Acquired: 02/12/2018 Weeks Of Treatment: 2 Clustered Wound: No Photos Photo Uploaded By: Renne Crigler on 03/09/2018 11:47:18 Wound Measurements Length: (cm) 0.5 Width: (cm) 0.5 Depth: (cm) 0.1 Area: (cm) 0.196 Volume: (cm) 0.02 % Reduction in Area: 50.1% % Reduction in Volume: 48.7% Epithelialization: Small (1-33%) Tunneling: No Undermining: No Wound Description Classification: Category/Stage II Wound Margin: Flat  and Intact Exudate Amount: None Present Foul Odor After Cleansing: No Slough/Fibrino Yes Wound Bed Granulation Amount: Large (67-100%) Exposed Structure Granulation Quality: Red Fascia Exposed: No Necrotic Amount: Small (1-33%) Fat Layer (Subcutaneous Tissue) Exposed: Yes Tendon Exposed: No Muscle Exposed: No Joint Exposed: No Bone Exposed: No Periwound Skin Texture Texture Color No Abnormalities Noted: No No Abnormalities Noted: No Tina MainlandSHAW, Tasheika W. ([id number]) Callus: No Atrophie Blanche: No Crepitus: No Cyanosis: No Excoriation: No Ecchymosis: No Induration: No Erythema: No Rash: No Hemosiderin Staining: No Scarring: No Mottled: No Pallor: No Moisture Rubor: No No Abnormalities Noted: No Dry / Scaly: No Temperature / Pain Maceration: No Temperature:  No Abnormality Wound Preparation Ulcer Cleansing: Rinsed/Irrigated with Saline Topical Anesthetic Applied: None Treatment Notes Wound #8 (Right, Posterior Knee) 1. Cleansed with: Clean wound with Normal Saline 2. Anesthetic Topical Lidocaine 4% cream to wound bed prior to debridement 5. Secondary Dressing Applied Bordered Foam Dressing Electronic Signature(s) Signed: 03/09/2018 11:44:07 AM By: Renne CriglerFlinchum, Cheryl Entered By: Renne CriglerFlinchum, Cheryl on 03/09/2018 10:43:38 Tina MainlandSHAW, Analysse W. ([id number]) -------------------------------------------------------------------------------- Wound Assessment Details Patient Name: Tina MainlandSHAW, Cassia W. Date of Service: 03/09/2018 10:15 AM Medical Record Number: [id number] Patient Account Number: [id number] Date of Birth/Sex: 02/08/1923 (84 y.o. F) Treating RN: Renne CriglerFlinchum, Cheryl Primary Care Yatziri Wainwright: Vonita MossRISSMAN, MARK Other Clinician: Referring Amenda Duclos: Vonita MossRISSMAN, MARK Treating Kareen Jefferys/Extender: STONE III, HOYT Weeks in Treatment: 60 Wound Status Wound Number: 9 Primary Etiology: Trauma, Other Wound Location: Left Elbow Wound Status: Open Wounding Event: Trauma Comorbid History: Anemia, Hypertension, Dementia Date Acquired: 02/16/2018 Weeks Of Treatment: 2 Clustered Wound: No Photos Photo Uploaded By: Renne CriglerFlinchum, Cheryl on 03/09/2018 11:47:19 Wound Measurements Length: (cm) 1.5 Width: (cm) 1 Depth: (cm) 0.1 Area: (cm) 1.178 Volume: (cm) 0.118 % Reduction in Area: 2.6% % Reduction in Volume: 2.5% Epithelialization: None Tunneling: No Undermining: No Wound Description Full Thickness Without Exposed Support Classification: Structures Wound Margin: Distinct, outline attached Exudate Medium Amount: Exudate Type: Serosanguineous Exudate Color: red, brown Foul Odor After Cleansing: No Slough/Fibrino No Wound Bed Granulation Amount: Medium (34-66%) Exposed Structure Granulation Quality: Red, Pink Fascia Exposed: No Necrotic Amount: Medium  (34-66%) Fat Layer (Subcutaneous Tissue) Exposed: No Necrotic Quality: Adherent Slough Tendon Exposed: No Muscle Exposed: No Joint Exposed: No Bone Exposed: No Tina MainlandSHAW, Minnie W. ([id number]) Periwound Skin Texture Texture Color No Abnormalities Noted: No No Abnormalities Noted: No Moisture Temperature / Pain No Abnormalities Noted: No Temperature: No Abnormality Tenderness on Palpation: Yes Wound Preparation Ulcer Cleansing: Rinsed/Irrigated with Saline Topical Anesthetic Applied: None, Other: lidocaine 4%, Treatment Notes Wound #9 (Left Elbow) 1. Cleansed with: Clean wound with Normal Saline 2. Anesthetic Topical Lidocaine 4% cream to wound bed prior to debridement 4. Dressing Applied: Xeroform 5. Secondary Dressing Applied [place] Electronic Signature(s) Signed: 03/09/2018 11:44:07 AM By: Renne CriglerFlinchum, Cheryl Entered By: Renne CriglerFlinchum, Cheryl on 03/09/2018 10:44:35 Tina MainlandSHAW, Lash[place] W. ([id number]) -------------------------------------------------------------------------------- Vitals Details Patient Name: Tina MainlandSHAW, Letty W. Date of Service: 03/09/2018 10:15 AM Medical Record Number: [id number] Patient Account Number: [id number] Date of Birth/Sex: 02/08/1923 (84 y.o. F) Treating RN: Renne CriglerFlinchum, Cheryl Primary Care Armetta Henri: Vonita MossRISSMAN, MARK Other Clinician: Referring Shimika Ames: Vonita MossRISSMAN, MARK Treating Jaimarie Rapozo/Extender: STONE III, HOYT Weeks in Treatment: 60 Vital Signs Time Taken: 10:23 Temperature (F): 97.7 Height (in): 60 Pulse (bpm): 70 Weight (lbs): 90 Respiratory Rate (breaths/min): 18 Body Mass Index (BMI): 17.6 Blood Pressure (mmHg): 154/84 Reference Range: 80 - 120 mg / dl Electronic Signature(s) Signed: 03/09/2018 11:44:07 AM By: Renne CriglerFlinchum, Cheryl Entered By: Renne CriglerFlinchum, Cheryl on 03/09/2018 10:23:47

## 2018-03-18 ENCOUNTER — Telehealth: Payer: Self-pay | Admitting: Family Medicine

## 2018-03-18 MED ORDER — NYSTATIN 100000 UNIT/ML MT SUSP: 5.0000 mL | Freq: Four times a day (QID) | OROMUCOSAL | 0 refills | Status: DC

## 2018-03-18 NOTE — Telephone Encounter (Signed)
Copied from CRM [phone]. Topic: Quick Communication - See Telephone Encounter >> Mar 18, 2018 11:25 AM Arlyss Gandyichardson, Modine Oppenheimer N, NT wrote: CRM for notification. See Telephone encounter for: 03/18/18. Pts daughter, Curlene Dolphinrudy Hurst calling to see if something can be called in for her mom to take for thrush in her mouth. Please advise. [facility] [id number] - [place], [place] - [address] [phone] (Phone) [phone] (Fax)

## 2018-03-18 NOTE — Telephone Encounter (Signed)
Done

## 2018-03-19 NOTE — Telephone Encounter (Signed)
Message relayed to patient. Verbalized understanding and denied questions.   

## 2018-03-23 ENCOUNTER — Encounter: Payer: Medicare Other | Admitting: Physician Assistant

## 2018-03-23 DIAGNOSIS — L89153 Pressure ulcer of sacral region, stage 3: Secondary | ICD-10-CM | POA: Diagnosis not present

## 2018-03-25 NOTE — Progress Notes (Signed)
JEANIFER, HALLIDAY ([id number]) Visit Report for 03/23/2018 Chief Complaint Document Details Patient Name: [name], [name] Date of Service: 03/23/2018 10:15 AM Medical Record Number: [id number] Patient Account Number: [id number] Date of Birth/Sex: 12-Apr-1923 (84 y.o. F) Treating RN: Phillis Haggis Primary Care Provider: Vonita Moss Other Clinician: Referring Provider: Vonita Moss Treating Provider/Extender: Linwood Dibbles, Terrisha Lopata Weeks in Treatment: 49 Information Obtained from: Patient Chief Complaint patient is here for follow up evaluation of a sacral pressure ulcer Electronic Signature(s) Signed: 03/24/2018 12:21:38 AM By: Lenda Kelp PA-C Entered By: Lenda Kelp on 03/23/2018 10:16:35 [name] ([id number]) -------------------------------------------------------------------------------- Debridement Details Patient Name: [name] Date of Service: 03/23/2018 10:15 AM Medical Record Number: [id number] Patient Account Number: [id number] Date of Birth/Sex: November 24, 1922 (84 y.o. F) Treating RN: Curtis Sites Primary Care Provider: Vonita Moss Other Clinician: Referring Provider: Vonita Moss Treating Provider/Extender: Linwood Dibbles, Adarian Bur Weeks in Treatment: 62 Debridement Performed for Wound #9 Left Elbow Assessment: Performed By: Physician STONE III, Titania Gault E., PA-C Debridement Type: Debridement Pre-procedure Verification/Time Yes - 11:15 Out Taken: Start Time: 11:15 Pain Control: Lidocaine 4% Topical Solution Total Area Debrided (L x W): 2.3 (cm) x 1.9 (cm) = 4.37 (cm) Tissue and other material Viable, Non-Viable, Eschar, Slough, Subcutaneous, Slough debrided: Level: Skin/Subcutaneous Tissue Debridement Description: Excisional Instrument: Curette Bleeding: Minimum Hemostasis Achieved: Pressure End Time: 11:17 Procedural Pain: 0 Post Procedural Pain: 0 Response to Treatment: Procedure was tolerated well Level of Consciousness: Responds to Verbal  Stimuli Post Procedure Vitals: Temperature: 97.7 Pulse: 64 Respiratory Rate: 18 Blood Pressure: Systolic Blood Pressure: 118 Diastolic Blood Pressure: 65 Post Debridement Measurements of Total Wound Length: (cm) 2.3 Width: (cm) 1.9 Depth: (cm) 0.2 Volume: (cm) 0.686 Character of Wound/Ulcer Post Debridement: Improved Post Procedure Diagnosis Same as Pre-procedure Electronic Signature(s) Signed: 03/23/2018 5:42:13 PM By: Curtis Sites Signed: 03/24/2018 12:21:38 AM By: Lenda Kelp PA-C Entered By: Curtis Sites on 03/23/2018 11:17:52 [name] ([id number]) -------------------------------------------------------------------------------- HPI Details Patient Name: [name] Date of Service: 03/23/2018 10:15 AM Medical Record Number: [id number] Patient Account Number: [id number] Date of Birth/Sex: 05/21/1923 (84 y.o. F) Treating RN: Curtis Sites Primary Care Provider: Vonita Moss Other Clinician: Referring Provider: Vonita Moss Treating Provider/Extender: Linwood Dibbles, Journee Bobrowski Weeks in Treatment: 90 History of Present Illness HPI Description: 01/07/17 this is a 84 year old woman admitted to the clinic today for review of a pressure ulcer on her lower sacrum. She is referred from her primary physician's office after being seen on 3/22 with a 3 cm pressure area. Her daughter and caretaker accompanied her today state that the area first became obvious about a month ago and his since deteriorated. They have recently got [name] home health involved and have been using [name] to the wound. They have ordered a pressure relief surface for her mattress. They are turning her religiously. They state that she eats well and they've been forcing fluids on her. She is on a multivitamin. Looking through [facility] point last albumin I see was 4.4 on 10/28. The patient has advanced parkinsonism which looks superficially like advanced Parkinson's disease although her daughter  tells me she did not ever respond to Sinemet therefore this may have another pathology with signs of parkinsonism. However I think this is largely a mute point currently. She also has dementia and is nonambulatory. Since this started they have been keeping her in bed and turning her religiously every 2 hours. She lives at home in [place] with her husband with 24/7 care giving 01/13/17  santyl change qd. Still will require further debridement. continue santyl. 01/20/17; patient's wound actually looks some better less adherent necrotic surface. There is actually visible granulation. We're using [name] 01/27/17; better-looking surface but still a lot of necrotic tissue on the base of this wound. The periwound erythema is better than last week we are still using [name]. Her daughter tells [place] that she is still having trouble with the pressure-relief mattress through medical modalities 02/03/18; I had the patient scheduled for a two week followup however her daughter brought her in early concerned for discoloration on 2 areas of the wound circumference. We have bee using santyl 02/10/17;Better looking surface to the wound. Rim appears better suggesting better offloading. Using santyl 02/24/17; change to Silver [place] last time. Wound appears better. 03/10/17; still using silver collagen religious offloading. Intake is satisfactory per her daughter. Dimension slightly better 03/12/2017 -- Dr. Jannetta Quint patient who had been seen 2 days ago and was doing fairly well. The patient is brought in by her daughter who noticed a new wound just above the previous wound on her sacral area and going on more to the left lateral side. She was very concerned and we asked her to get in for an opinion. 03/17/17; above is noted. The patient has developed a progressive area to the left of her original wound. This seems to this started with a ring of red skin with a more pale interior almost looking fungal. There was a rim of  blister through part of the area although this did not look like zoster. They have been applying triamcinolone that was prescribed last week by Dr. Meyer Russel and the area has a fold to a linear band area which is confluent, erythematous and with obvious epidermal swelling but there is no overt tenderness or crepitus. She has lost some surface epithelium closer to the wound surface itself and now has a more superficial wound in this area and the extending erythema goes towards the left buttock. This is well demarcated between involved in normal skin but once again does not appear to be at all tender. If there is a contact issue here I cannot get the history out of the daughter or the caregiver that are with her. 03/24/17; the patient arrives today with the wound slightly worse slightly more drainage. The bandlike area of erythema that I treated as a possible pineal infection has improved somewhat although proximally is still has confluent erythema without overt tenderness. We have been using silver alginate since the most recent deterioration. To the bandlike degree of erythema we have been using Lotrisone cream 03/31/17; patient arrives today with the wound slightly larger, necrotic surface and surrounding erythema. The bandlike area of erythema that I treated as a possible pineal infection is less swollen but still present I've been using Lotrisone cream on that largely related to the presence of a tinea looking infection when this was first seen. We've been using silver alginate. X-ray that I ordered last week showed no acute bony abnormalities mild fecal impaction. Lab work showed a comprehensive metabolic panel that was normal including an albumin of 3.8. White count was 9.5 hemoglobin 12.3 differential count normal. C-reactive protein was less than 1 and sedimentation rate and 17. The latter 2 values does not support an ongoing bacterial infection. 04/14/17; patient arrives after a 2 week hiatus. Her  wound is not in good condition. Although the base of the wound looks MARKASIA, CARROL. ([id number]) stable she still has an erythematous area that was apparently  blistered over the weekend. This again points to the left. As our intake nurse pointed out today this is in the area where the tissue folds together and we may need to prevent try to prevent this. Lab work and x-ray that I did to 3 weeks ago were unremarkable including her albumin nevertheless she is an extremely frail condition physically. We have have been using [name] 04/22/17; I changed her to silver alginate because of the surrounding maceration and moisture last week. The daughter did not like the way the wound looked in the middle of the week and changed her back to [place]. They're putting gauze on top of this. Thinks still using Lotrisone. 05/06/17 on evaluation today patient sacral wound appears to be doing okay and does not seem to be any worse. She is having no significant pain during evaluation today the secondary to mental status she is unable to rate or describe whether she had any pain she was not however flinching. Her daughter states that the wound does appear to be looking better to her. Still we are having difficulty with the skinfold that seems to be closing in on itself at this point. All in all I feel like she is making some good progress in the [name] seems to be the official for her. They do tell me that a refill if we are gonna continue that today. No fevers, chills, nausea, or vomiting noted at this time. 05/13/17 presents today for evaluation concerning her ongoing sacral pressure ulcer. Unfortunately she also has an area of deep tissue injury in the right Ischial region which is starting to show up. The sacral wound also continues to show signs of necrotic tissue overlying and has declined. Overall we really have not seen a significant improvement in the past several months in regard to the sacral wound and now  patient is starting to develop a new wound in the right Ischial region. Obviously this is not trending in the direction that we want to see. No fevers, chills, nausea, or vomiting noted at this time. 05/19/17; I have not seen this wound and almost a month however there is nothing really positive to say about it. Necrotic tissue over the surface which superiorly I think abuts on her sacrum. She has surrounding erythema. I would be surprised if there is not underlying osteomyelitis or soft tissue infection. 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 She apparently eats well per description although I wonder about this looking at her. Lab work I did probably 4 weeks ago however was really quite normal including a serum albumin 05/26/17; culture I did last week grew Escherichia coli and methicillin sensitive staph aureus which should've been well covered by the Augmentin and ciprofloxacin. Indeed the erythema around the wound in the bed of the wound looks somewhat better. Her intake is still satisfactory. They now have a near fluidized bed 06/02/17; they completed the antibiotics last Friday. Using collagen. Daughter still reports eating and drinking well. There is less visualized erythema around the wound. 06/16/17; large pressure ulcer over her lower sacrum and coccyx. Using [name] to the wound bed. 06/30/17; certainly no change in dimensions of this large stage III wound over her sacrum and coccyx. They've been using [name]. There is no exposed bone. She has a candidal/tinea area in the right inguinal area. Other than that her daughter relates that she is eating and drinking well there changing her positioning to make sure the areas offloaded 07/14/17; no major change in the  dimensions of this large stage III wound. Initially a smaller wound that became secondarily infected causing significant tissue breakdown although it is been stable in the last several weeks. Tunneling superiorly at roughly  1:00 no change here either. There is no bone palpable. Both the patient's daughter and caretaker states that she eats well. I have not rechecked her blood work 07/27/17; patient arrives in clinic today and generally a deteriorated looking state. Mild fever with axillary temperature of 100.4. Daughter reports she has not been eating and drinking well since yesterday. She looks more pale and thin and less responsive. We have been using silver collagen to her wound 08/11/17; since the last time the patient was here things have gone better. Her fever went down and she started eating and drinking again. Culture I did of the wound showed methicillin sensitive staph aureus, Morganella and enterococcus. I only treated her with Keflex which would've not covered the Morganella and enterococcus however the purulent area on the 2:00 side of her wound is a lot better and the bandlike erythema that concern me also was resolved. I'd called the daughter last week to confirm that she was a lot better. She finished the Keflex last Thursday she also suffered a skin tear this morning perhaps while putting on her incontinence brief period is on the lateral aspect of her right leg. Clean wound with the surface epithelium not viable. 08/25/17; last week the patient was noted to have erythema around the wound margin and a slight fever which the patient's daughter says was 55. Our office was contacted by home health however we did not have a space to work the patient in that she went to see her primary physician Dr. Maurice March. She was not febrile during this visit on 08/21/17 there was erythema around the wound similar to last occasion. Dr. Maurice March in reference to my culture from 07/28/17 and put her on doxycycline capsules which they're opening twice a day for 10 days. 09/17/17; patient arrives today with the wound bed looking fairly well granulated. There is undermining from 4 to 6:00 although this seems to of contracted  slightly. She does not have obvious infection although the daughter states there was some darkening of the wound circumference that is not evident today. They state she is eating well. They are concerned about oral thrush 09/30/16; very fibrin looking granulated wound bed. Her undermining from 4 to 6:00 is about the same but also appears to be well granulated. She has rolled edges of senescent tissue from roughly 7 to 12:00. There is no evidence of infection ETHELWYN, GILBERTSON ([id number]) 10/13/17 on evaluation today patient appears to be doing fairly well all things considered in regard to her sacral wound. There's really not a lot of significant change or improvement she does have some evidence of contusion and deep tissue injury around the left border of the wound that patient's daughter did inquire about today. Nonetheless overall the wound appears to be doing about the same in my opinion. There is no significant indication of infection there also is no significant slough noted at this point. 10/27/17 She is here in follow up evaluation of a sacral ulcer. She is accompanied by her daughter and caregiver. There is red granulation tissue throughout, persistent discoloration to left border; this appears consistent with deep tissue injury. There are multiple areas covered in foam borders, tegaderm, etc that are "preventative" with "no wounds". We will continue with prisma and continue with two week follow ups 11/17/17 on  evaluation today patient appears to be doing decently well in regard to her wound at this point. She continues to have a sacral wound ulcer. We see her roughly every two weeks. In the last week she was actually in the hospital due to what was diagnosed as sepsis although no organisms were ever identified in the actual blood cultures. The hospital really was not sure that the wound was the cause of the infection but they really did not figure out anything else that would be a  causative organism she did have a CT scan and that revealed no evidence of pneumonia there was also no evidence of urinary tract infection. Again it very well could have been the wound called in those although wound appears to be doing fairly well at this point. 12/15/17 on evaluation today patient actually appears to be doing about the same in regard to her sacral ulcer. The one thing different is that she does seem to have a rash where her right arm is contracted and being held to the thorax. The area underlying both on the ventral side of the arm as well is the thorax where it comes in contact shows evidence of a rash which appears to be fungal in nature. There does not appear to be any evidence of infection lies at this point. There does not appear to be a rash consistent with shingles which was also of concern initially. No fevers, chills, nausea, or vomiting noted at this time. 12/29/17 on evaluation today patient appears to be doing a little better in regard to her sacral wound although the one changes the 12 o'clock location of the wound seems to have attached at one point which no longer allows this to pull back. This has caused an area of undermining that seems to be attaching as well in the 12 o'clock location. I do think that this is something that can be managed and is not necessarily a bad thing. Nonetheless she does seem to have some pain with exploration of this region of undermining. She continues to have an area under her right arm on the chest wall of erythema although this is a little better especially after the home health nurse is actually got in order for Diflucan times one for the patient. She is a new area on the left wrist that looks like because of the contraction her nail may have pushed in on her wrist area causing a slight cut which subsequently became infected. She has some honey crusted drainage noted. 03/09/18 undervaluation today patient appears to be doing fairly well  in regard to her wounds in general. She has been tolerating the dressing changes without complication. Overall I'm pleased with the progress that seems to be made week by week especially in regard to the sacral ulcer. Her daughter and the caregiver seem to take very good care of her. With that being said she has very little undermining in regard to the sacral wound in good granulation she also has good epithelialization noted. 03/23/18 on evaluation today patient appears to be doing rather well in regard to her sacral wound. Unfortunately she does have a little bit of necrotic tissue noted in the central portion of her wound on the left elbow. This is due to the fact that she is lying on this arm seeing how it is contracted. Currently her daughter has started to avoid lying on the side it all due to the fact that the necrotic tissue was noted. With that being said  they have been taking very good care of her in my pinion. Fortunately there does not appear to be any evidence of infection which is good news. Overall I'm pleased with the progress she's made other than in regard to the elbow. Electronic Signature(s) Signed: 03/24/2018 12:21:38 AM By: Lenda Kelp PA-C Entered By: Lenda Kelp on 03/23/2018 11:54:53 [name] ([id number]) -------------------------------------------------------------------------------- Physical Exam Details Patient Name: [name] Date of Service: 03/23/2018 10:15 AM Medical Record Number: [id number] Patient Account Number: [id number] Date of Birth/Sex: 10-10-1922 (84 y.o. F) Treating RN: Curtis Sites Primary Care Provider: Vonita Moss Other Clinician: Referring Provider: Vonita Moss Treating Provider/Extender: STONE III, Braylin Xu Weeks in Treatment: 75 Constitutional Chronically ill appearing but in no apparent acute distress. Respiratory normal breathing without difficulty. Psychiatric Patient is not able to cooperate in decision making  regarding care. Patient has dementia. patient is confused. Notes Patient at this point in time appears to show signs of good granulation currently in regard to the sacral wound. I'm pleased in this regard. The left elbow wound does seem to be causing some trouble unfortunately. Nonetheless the necrotic tissue that was noted centrally was a very small area and this appears to be easily removed with a curette which the patient tolerated without complication. Post debridement the wound bed appears to be better. Her daughter didn't want me to look in her mouth check for the possibility of thrush as she was recently treated and seem to be swallowing a little abnormally. I did have a look I did not see any evidence of rash which would be suggestive of thrush at this point. Obviously that is good news. Electronic Signature(s) Signed: 03/24/2018 12:21:38 AM By: Lenda Kelp PA-C Entered By: Lenda Kelp on 03/23/2018 12:00:05 [name] ([id number]) -------------------------------------------------------------------------------- Physician Orders Details Patient Name: [name] Date of Service: 03/23/2018 10:15 AM Medical Record Number: [id number] Patient Account Number: [id number] Date of Birth/Sex: Aug 30, 1923 (84 y.o. F) Treating RN: Curtis Sites Primary Care Provider: Vonita Moss Other Clinician: Referring Provider: Vonita Moss Treating Provider/Extender: Linwood Dibbles, Orion Mole Weeks in Treatment: 77 Verbal / Phone Orders: No Diagnosis Coding ICD-10 Coding Code Description L89.153 Pressure ulcer of sacral region, stage 3 G20 Parkinson's disease L03.312 Cellulitis of back [any part except buttock] S81.811D Laceration without foreign body, right lower leg, subsequent encounter Wound Cleansing Wound #1 Midline Coccyx o Clean wound with Normal Saline. o May Shower, gently pat wound dry prior to applying new dressing. Wound #9 Left Elbow o Clean wound with Normal  Saline. o May Shower, gently pat wound dry prior to applying new dressing. Anesthetic (add to Medication List) Wound #1 Midline Coccyx o Topical Lidocaine 4% cream applied to wound bed prior to debridement (In Clinic Only). Wound #9 Left Elbow o Topical Lidocaine 4% cream applied to wound bed prior to debridement (In Clinic Only). Skin Barriers/Peri-Wound Care Wound #1 Midline Coccyx o Skin Prep - use only where the allevyn sticks not on reddened areas o Antifungal powder-Nystatin - around peri-wound on coccyx (do not get in wound), under right breast and right arm, right abdomen Primary Wound Dressing Wound #1 Midline Coccyx o Silver Collagen - prisma ag and pack into undermining as well slightly moisten with saline Wound #9 Left Elbow o Silver Collagen - moisten with hydrogel Secondary Dressing Wound #1 Midline Coccyx o Boardered Foam Dressing - HHRN PLEASE ORDER ALLEVYN LIFE BORDERED FOAM FOR PATIENT (OFF BRANDS IRRITATE HER SKIN) o Drawtex - HHRN to provide this  for patient DEVIKA, DRAGOVICH ([id number]) Wound #9 Left Elbow o Dry Gauze - secure with tape o Foam Dressing Change Frequency Wound #1 Midline Coccyx o Change dressing every day. Wound #9 Left Elbow o Change dressing every day. Follow-up Appointments o Return Appointment in 2 weeks. Off-Loading Wound #1 Midline Coccyx o Roho cushion for wheelchair o Mattress - fluidized air mattress o Turn and reposition every 2 hours Additional Orders / Instructions o Other: - HHRN - Right hip foam and telfa island change every other day Wound #1 Midline Coccyx o Increase protein intake. - please add protein supplements to patients diet o Other: - please add vitamin A, vitamin C and zinc supplements to patients diet Home Health Wound #1 Midline Coccyx o Continue Home Health Visits - [facility] o Home Health Nurse may visit PRN to address patientos wound care needs. o FACE TO FACE  ENCOUNTER: MEDICARE and MEDICAID PATIENTS: I certify that this patient is under my care and that I had a face-to-face encounter that meets the physician face-to-face encounter requirements with this patient on this date. The encounter with the patient was in whole or in part for the following MEDICAL CONDITION: (primary reason for Home Healthcare) MEDICAL NECESSITY: I certify, that based on my findings, NURSING services are a medically necessary home health service. HOME BOUND STATUS: I certify that my clinical findings support that this patient is homebound (i.e., Due to illness or injury, pt requires aid of supportive devices such as crutches, cane, wheelchairs, walkers, the use of special transportation or the assistance of another person to leave their place of residence. There is a normal inability to leave the home and doing so requires considerable and taxing effort. Other absences are for medical reasons / religious services and are infrequent or of short duration when for other reasons). o If current dressing causes regression in wound condition, may D/C ordered dressing product/s and apply Normal Saline Moist Dressing daily until next Wound Healing Center / Other MD appointment. Notify Wound Healing Center of regression in wound condition at [phone]. o Please direct any NON-WOUND related issues/requests for orders to patient's Primary Care Physician - Dr Vonita Moss Wound #9 Left Elbow o Continue Home Health Visits - [facility] o Home Health Nurse may visit PRN to address patientos wound care needs. o FACE TO FACE ENCOUNTER: MEDICARE and MEDICAID PATIENTS: I certify that this patient is under my care and that I had a face-to-face encounter that meets the physician face-to-face encounter requirements with this patient on this date. The encounter with the patient was in whole or in part for the following MEDICAL CONDITION: (primary reason for Home Healthcare) MEDICAL  NECESSITY: I certify, that based on my findings, NURSING services are a medically necessary home health service. HOME BOUND STATUS: I certify that my clinical findings support that this patient is homebound (i.e., Due to illness or injury, pt requires aid of GENITA, NILSSON. ([id number]) supportive devices such as crutches, cane, wheelchairs, walkers, the use of special transportation or the assistance of another person to leave their place of residence. There is a normal inability to leave the home and doing so requires considerable and taxing effort. Other absences are for medical reasons / religious services and are infrequent or of short duration when for other reasons). o If current dressing causes regression in wound condition, may D/C ordered dressing product/s and apply Normal Saline Moist Dressing daily until next Wound Healing Center / Other MD appointment. Notify Wound Healing Center of regression  in wound condition at [phone]. o Please direct any NON-WOUND related issues/requests for orders to patient's Primary Care Physician - Dr Vonita Moss Electronic Signature(s) Signed: 03/23/2018 5:42:13 PM By: Curtis Sites Signed: 03/24/2018 12:21:38 AM By: Lenda Kelp PA-C Entered By: Curtis Sites on 03/23/2018 11:22:31 [name] ([id number]) -------------------------------------------------------------------------------- Problem List Details Patient Name: [name] Date of Service: 03/23/2018 10:15 AM Medical Record Number: [id number] Patient Account Number: [id number] Date of Birth/Sex: 1922-12-15 (84 y.o. F) Treating RN: Phillis Haggis Primary Care Provider: Vonita Moss Other Clinician: Referring Provider: Vonita Moss Treating Provider/Extender: Linwood Dibbles, Bayler Gehrig Weeks in Treatment: 22 Active Problems ICD-10 Evaluated Encounter Code Description Active Date Today Diagnosis L89.153 Pressure ulcer of sacral region, stage 3 01/07/2017 No  Yes L89.023 Pressure ulcer of left elbow, stage 3 03/23/2018 No Yes G20 Parkinson's disease 01/07/2017 No Yes L03.312 Cellulitis of back [any part except buttock] 07/28/2017 No Yes S81.811D Laceration without foreign body, right lower leg, subsequent 08/11/2017 No Yes encounter Inactive Problems Resolved Problems Electronic Signature(s) Signed: 03/24/2018 12:21:38 AM By: Lenda Kelp PA-C Entered By: Lenda Kelp on 03/23/2018 13:05:15 [name] ([id number]) -------------------------------------------------------------------------------- Progress Note Details Patient Name: [name] Date of Service: 03/23/2018 10:15 AM Medical Record Number: [id number] Patient Account Number: [id number] Date of Birth/Sex: April 06, 1923 (84 y.o. F) Treating RN: Curtis Sites Primary Care Provider: Vonita Moss Other Clinician: Referring Provider: Vonita Moss Treating Provider/Extender: Linwood Dibbles, Dasean Brow Weeks in Treatment: 34 Subjective Chief Complaint Information obtained from Patient patient is here for follow up evaluation of a sacral pressure ulcer History of Present Illness (HPI) 01/07/17 this is a 84 year old woman admitted to the clinic today for review of a pressure ulcer on her lower sacrum. She is referred from her primary physician's office after being seen on 3/22 with a 3 cm pressure area. Her daughter and caretaker accompanied her today state that the area first became obvious about a month ago and his since deteriorated. They have recently got [name] home health involved and have been using [name] to the wound. They have ordered a pressure relief surface for her mattress. They are turning her religiously. They state that she eats well and they've been forcing fluids on her. She is on a multivitamin. Looking through [facility] point last albumin I see was 4.4 on 10/28. The patient has advanced parkinsonism which looks superficially like advanced Parkinson's disease  although her daughter tells me she did not ever respond to Sinemet therefore this may have another pathology with signs of parkinsonism. However I think this is largely a mute point currently. She also has dementia and is nonambulatory. Since this started they have been keeping her in bed and turning her religiously every 2 hours. She lives at home in [place] with her husband with 24/7 care giving 01/13/17 santyl change qd. Still will require further debridement. continue santyl. 01/20/17; patient's wound actually looks some better less adherent necrotic surface. There is actually visible granulation. We're using [name] 01/27/17; better-looking surface but still a lot of necrotic tissue on the base of this wound. The periwound erythema is better than last week we are still using [name]. Her daughter tells [place] that she is still having trouble with the pressure-relief mattress through medical modalities 02/03/18; I had the patient scheduled for a two week followup however her daughter brought her in early concerned for discoloration on 2 areas of the wound circumference. We have bee using santyl 02/10/17;Better looking surface to the wound. Rim appears better  suggesting better offloading. Using santyl 02/24/17; change to Silver Collegen last time. Wound appears better. 03/10/17; still using silver collagen religious offloading. Intake is satisfactory per her daughter. Dimension slightly better 03/12/2017 -- Dr. Jannetta Quint patient who had been seen 2 days ago and was doing fairly well. The patient is brought in by her daughter who noticed a new wound just above the previous wound on her sacral area and going on more to the left lateral side. She was very concerned and we asked her to get in for an opinion. 03/17/17; above is noted. The patient has developed a progressive area to the left of her original wound. This seems to this started with a ring of red skin with a more pale interior almost looking fungal.  There was a rim of blister through part of the area although this did not look like zoster. They have been applying triamcinolone that was prescribed last week by Dr. Meyer Russel and the area has a fold to a linear band area which is confluent, erythematous and with obvious epidermal swelling but there is no overt tenderness or crepitus. She has lost some surface epithelium closer to the wound surface itself and now has a more superficial wound in this area and the extending erythema goes towards the left buttock. This is well demarcated between involved in normal skin but once again does not appear to be at all tender. If there is a contact issue here I cannot get the history out of the daughter or the caregiver that are with her. 03/24/17; the patient arrives today with the wound slightly worse slightly more drainage. The bandlike area of erythema that I treated as a possible pineal infection has improved somewhat although proximally is still has confluent erythema without overt tenderness. We have been using silver alginate since the most recent deterioration. To the bandlike degree of erythema we have been using Lotrisone cream 03/31/17; patient arrives today with the wound slightly larger, necrotic surface and surrounding erythema. The bandlike area of erythema that I treated as a possible pineal infection is less swollen but still present I've been using Lotrisone cream on that largely related to the presence of a tinea looking infection when this was first seen. We've been using silver alginate. REESA, GOTSCHALL ([id number]) X-ray that I ordered last week showed no acute bony abnormalities mild fecal impaction. Lab work showed a comprehensive metabolic panel that was normal including an albumin of 3.8. White count was 9.5 hemoglobin 12.3 differential count normal. C-reactive protein was less than 1 and sedimentation rate and 17. The latter 2 values does not support an ongoing  bacterial infection. 04/14/17; patient arrives after a 2 week hiatus. Her wound is not in good condition. Although the base of the wound looks stable she still has an erythematous area that was apparently blistered over the weekend. This again points to the left. As our intake nurse pointed out today this is in the area where the tissue folds together and we may need to prevent try to prevent this. Lab work and x-ray that I did to 3 weeks ago were unremarkable including her albumin nevertheless she is an extremely frail condition physically. We have have been using [name] 04/22/17; I changed her to silver alginate because of the surrounding maceration and moisture last week. The daughter did not like the way the wound looked in the middle of the week and changed her back to [place]. They're putting gauze on top of this. Thinks still using Lotrisone. 05/06/17  on evaluation today patient sacral wound appears to be doing okay and does not seem to be any worse. She is having no significant pain during evaluation today the secondary to mental status she is unable to rate or describe whether she had any pain she was not however flinching. Her daughter states that the wound does appear to be looking better to her. Still we are having difficulty with the skinfold that seems to be closing in on itself at this point. All in all I feel like she is making some good progress in the [name] seems to be the official for her. They do tell me that a refill if we are gonna continue that today. No fevers, chills, nausea, or vomiting noted at this time. 05/13/17 presents today for evaluation concerning her ongoing sacral pressure ulcer. Unfortunately she also has an area of deep tissue injury in the right Ischial region which is starting to show up. The sacral wound also continues to show signs of necrotic tissue overlying and has declined. Overall we really have not seen a significant improvement in the past several months  in regard to the sacral wound and now patient is starting to develop a new wound in the right Ischial region. Obviously this is not trending in the direction that we want to see. No fevers, chills, nausea, or vomiting noted at this time. 05/19/17; I have not seen this wound and almost a month however there is nothing really positive to say about it. Necrotic tissue over the surface which superiorly I think abuts on her sacrum. She has surrounding erythema. I would be surprised if there is not underlying osteomyelitis or soft tissue infection. 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 She apparently eats well per description although I wonder about this looking at her. Lab work I did probably 4 weeks ago however was really quite normal including a serum albumin 05/26/17; culture I did last week grew Escherichia coli and methicillin sensitive staph aureus which should've been well covered by the Augmentin and ciprofloxacin. Indeed the erythema around the wound in the bed of the wound looks somewhat better. Her intake is still satisfactory. They now have a near fluidized bed 06/02/17; they completed the antibiotics last Friday. Using collagen. Daughter still reports eating and drinking well. There is less visualized erythema around the wound. 06/16/17; large pressure ulcer over her lower sacrum and coccyx. Using [name] to the wound bed. 06/30/17; certainly no change in dimensions of this large stage III wound over her sacrum and coccyx. They've been using [name]. There is no exposed bone. She has a candidal/tinea area in the right inguinal area. Other than that her daughter relates that she is eating and drinking well there changing her positioning to make sure the areas offloaded 07/14/17; no major change in the dimensions of this large stage III wound. Initially a smaller wound that became secondarily infected causing significant tissue breakdown although it is been stable in the last several  weeks. Tunneling superiorly at roughly 1:00 no change here either. There is no bone palpable. Both the patient's daughter and caretaker states that she eats well. I have not rechecked her blood work 07/27/17; patient arrives in clinic today and generally a deteriorated looking state. Mild fever with axillary temperature of 100.4. Daughter reports she has not been eating and drinking well since yesterday. She looks more pale and thin and less responsive. We have been using silver collagen to her wound 08/11/17; since the last time the  patient was here things have gone better. Her fever went down and she started eating and drinking again. Culture I did of the wound showed methicillin sensitive staph aureus, Morganella and enterococcus. I only treated her with Keflex which would've not covered the Morganella and enterococcus however the purulent area on the 2:00 side of her wound is a lot better and the bandlike erythema that concern me also was resolved. I'd called the daughter last week to confirm that she was a lot better. She finished the Keflex last Thursday she also suffered a skin tear this morning perhaps while putting on her incontinence brief period is on the lateral aspect of her right leg. Clean wound with the surface epithelium not viable. 08/25/17; last week the patient was noted to have erythema around the wound margin and a slight fever which the patient's daughter says was 24. Our office was contacted by home health however we did not have a space to work the patient in that she went to see her primary physician Dr. Maurice March. She was not febrile during this visit on 08/21/17 there was erythema around the wound similar to last occasion. Dr. Maurice March in reference to my culture from 07/28/17 and put her on doxycycline capsules which they're opening twice a day for 10 days. MARGALIT, LEECE ([id number]) 09/17/17; patient arrives today with the wound bed looking fairly well granulated. There is  undermining from 4 to 6:00 although this seems to of contracted slightly. She does not have obvious infection although the daughter states there was some darkening of the wound circumference that is not evident today. They state she is eating well. They are concerned about oral thrush 09/30/16; very fibrin looking granulated wound bed. Her undermining from 4 to 6:00 is about the same but also appears to be well granulated. She has rolled edges of senescent tissue from roughly 7 to 12:00. There is no evidence of infection 10/13/17 on evaluation today patient appears to be doing fairly well all things considered in regard to her sacral wound. There's really not a lot of significant change or improvement she does have some evidence of contusion and deep tissue injury around the left border of the wound that patient's daughter did inquire about today. Nonetheless overall the wound appears to be doing about the same in my opinion. There is no significant indication of infection there also is no significant slough noted at this point. 10/27/17 She is here in follow up evaluation of a sacral ulcer. She is accompanied by her daughter and caregiver. There is red granulation tissue throughout, persistent discoloration to left border; this appears consistent with deep tissue injury. There are multiple areas covered in foam borders, tegaderm, etc that are "preventative" with "no wounds". We will continue with prisma and continue with two week follow ups 11/17/17 on evaluation today patient appears to be doing decently well in regard to her wound at this point. She continues to have a sacral wound ulcer. We see her roughly every two weeks. In the last week she was actually in the hospital due to what was diagnosed as sepsis although no organisms were ever identified in the actual blood cultures. The hospital really was not sure that the wound was the cause of the infection but they really did not figure out  anything else that would be a causative organism she did have a CT scan and that revealed no evidence of pneumonia there was also no evidence of urinary tract infection. Again it very well  could have been the wound called in those although wound appears to be doing fairly well at this point. 12/15/17 on evaluation today patient actually appears to be doing about the same in regard to her sacral ulcer. The one thing different is that she does seem to have a rash where her right arm is contracted and being held to the thorax. The area underlying both on the ventral side of the arm as well is the thorax where it comes in contact shows evidence of a rash which appears to be fungal in nature. There does not appear to be any evidence of infection lies at this point. There does not appear to be a rash consistent with shingles which was also of concern initially. No fevers, chills, nausea, or vomiting noted at this time. 12/29/17 on evaluation today patient appears to be doing a little better in regard to her sacral wound although the one changes the 12 o'clock location of the wound seems to have attached at one point which no longer allows this to pull back. This has caused an area of undermining that seems to be attaching as well in the 12 o'clock location. I do think that this is something that can be managed and is not necessarily a bad thing. Nonetheless she does seem to have some pain with exploration of this region of undermining. She continues to have an area under her right arm on the chest wall of erythema although this is a little better especially after the home health nurse is actually got in order for Diflucan times one for the patient. She is a new area on the left wrist that looks like because of the contraction her nail may have pushed in on her wrist area causing a slight cut which subsequently became infected. She has some honey crusted drainage noted. 03/09/18 undervaluation today patient  appears to be doing fairly well in regard to her wounds in general. She has been tolerating the dressing changes without complication. Overall I'm pleased with the progress that seems to be made week by week especially in regard to the sacral ulcer. Her daughter and the caregiver seem to take very good care of her. With that being said she has very little undermining in regard to the sacral wound in good granulation she also has good epithelialization noted. 03/23/18 on evaluation today patient appears to be doing rather well in regard to her sacral wound. Unfortunately she does have a little bit of necrotic tissue noted in the central portion of her wound on the left elbow. This is due to the fact that she is lying on this arm seeing how it is contracted. Currently her daughter has started to avoid lying on the side it all due to the fact that the necrotic tissue was noted. With that being said they have been taking very good care of her in my pinion. Fortunately there does not appear to be any evidence of infection which is good news. Overall I'm pleased with the progress she's made other than in regard to the elbow. Patient History Unable to Obtain Patient History due to Altered Mental Status. Information obtained from Patient. BRYNDA, HEICK ([id number]) Social History Never smoker, Marital Status - Married, Alcohol Use - Never, Drug Use - No History, Caffeine Use - Never. Medical And Surgical History Notes Ear/Nose/Mouth/Throat dysphagia Neurologic parkinsons, tremor Review of Systems (ROS) Constitutional Symptoms (General Health) Denies complaints or symptoms of Fever, Chills. Respiratory The patient has no complaints or symptoms. Cardiovascular  The patient has no complaints or symptoms. Psychiatric The patient has no complaints or symptoms. Objective Constitutional Chronically ill appearing but in no apparent acute distress. Vitals Time Taken: 10:25 AM, Height: 60 in,  Weight: 90 lbs, BMI: 17.6, Temperature: 97.7 F, Pulse: 64 bpm, Respiratory Rate: 18 breaths/min, Blood Pressure: 118/65 mmHg. Respiratory normal breathing without difficulty. Psychiatric Patient is not able to cooperate in decision making regarding care. Patient has dementia. patient is confused. General Notes: Patient at this point in time appears to show signs of good granulation currently in regard to the sacral wound. I'm pleased in this regard. The left elbow wound does seem to be causing some trouble unfortunately. Nonetheless the necrotic tissue that was noted centrally was a very small area and this appears to be easily removed with a curette which the patient tolerated without complication. Post debridement the wound bed appears to be better. Her daughter didn't want me to look in her mouth check for the possibility of thrush as she was recently treated and seem to be swallowing a little abnormally. I did have a look I did not see any evidence of rash which would be suggestive of thrush at this point. Obviously that is good news. Integumentary (Hair, Skin) Wound #1 status is Open. Original cause of wound was Pressure Injury. The wound is located on the Midline Coccyx. The wound measures 4.8cm length x 1.8cm width x 0.7cm depth; 6.786cm^2 area and 4.75cm^3 volume. There is muscle and Fat Layer (Subcutaneous Tissue) Exposed exposed. There is no tunneling noted, however, there is undermining starting at 6:00 and ending at 8:00 with a maximum distance of 1.2cm. There is a large amount of serosanguineous drainage noted. Foul odor after cleansing was noted. The wound margin is flat and intact. There is large (67-100%) red, hyper - granulation within the wound bed. There is a small (1-33%) amount of necrotic tissue within the wound bed including Adherent Slough. The periwound skin appearance exhibited: Induration, Rash, Maceration, Erythema. The periwound skin appearance did not Tina MainlandSHAW,  Yomira W. ([id number]) exhibit: Callus, Crepitus, Excoriation, Scarring, Dry/Scaly, Atrophie Blanche, Cyanosis, Ecchymosis, Hemosiderin Staining, Mottled, Pallor, Rubor. The surrounding wound skin color is noted with erythema which is circumferential. Periwound temperature was noted as No Abnormality. The periwound has tenderness on palpation. Wound #8 status is Open. Original cause of wound was Pressure Injury. The wound is located on the Right,Posterior Knee. The wound measures 0cm length x 0cm width x 0cm depth; 0cm^2 area and 0cm^3 volume. Wound #9 status is Open. Original cause of wound was Trauma. The wound is located on the Left Elbow. The wound measures 2.3cm length x 1.9cm width x 0.1cm depth; 3.432cm^2 area and 0.343cm^3 volume. There is no tunneling or undermining noted. There is a medium amount of serosanguineous drainage noted. The wound margin is distinct with the outline attached to the wound base. There is large (67-100%) red, pink granulation within the wound bed. There is a small (1-33%) amount of necrotic tissue within the wound bed. The periwound skin appearance exhibited: Dry/Scaly, Erythema. The surrounding wound skin color is noted with erythema which is circumferential. Periwound temperature was noted as No Abnormality. The periwound has tenderness on palpation. Assessment Active Problems ICD-10 Pressure ulcer of sacral region, stage 3 Pressure ulcer of left elbow, stage 3 Parkinson's disease Cellulitis of back [any part except buttock] Laceration without foreign body, right lower leg, subsequent encounter Procedures Wound #9 Pre-procedure diagnosis of Wound #9 is a Trauma, Other located on the Left Elbow .  There was a Excisional Skin/Subcutaneous Tissue Debridement with a total area of 4.37 sq cm performed by STONE III, Christeena Krogh E., PA-C. With the following instrument(s): Curette to remove Viable and Non-Viable tissue/material. Material removed includes  Eschar, Subcutaneous Tissue, and Slough after achieving pain control using Lidocaine 4% Topical Solution. A time out was conducted at 11:15, prior to the start of the procedure. A Minimum amount of bleeding was controlled with Pressure. The procedure was tolerated well with a pain level of 0 throughout and a pain level of 0 following the procedure. Patient s Level of Consciousness post procedure was recorded as Responds to Verbal Stimuli. Post Debridement Measurements: 2.3cm length x 1.9cm width x 0.2cm depth; 0.686cm^3 volume. Character of Wound/Ulcer Post Debridement is improved. Post procedure Diagnosis Wound #9: Same as Pre-Procedure Plan Wound Cleansing: Wound #1 Midline Coccyx: Clean wound with Normal Saline. KIMYETTA, FLOTT ([id number]) May Shower, gently pat wound dry prior to applying new dressing. Wound #9 Left Elbow: Clean wound with Normal Saline. May Shower, gently pat wound dry prior to applying new dressing. Anesthetic (add to Medication List): Wound #1 Midline Coccyx: Topical Lidocaine 4% cream applied to wound bed prior to debridement (In Clinic Only). Wound #9 Left Elbow: Topical Lidocaine 4% cream applied to wound bed prior to debridement (In Clinic Only). Skin Barriers/Peri-Wound Care: Wound #1 Midline Coccyx: Skin Prep - use only where the allevyn sticks not on reddened areas Antifungal powder-Nystatin - around peri-wound on coccyx (do not get in wound), under right breast and right arm, right abdomen Primary Wound Dressing: Wound #1 Midline Coccyx: Silver Collagen - prisma ag and pack into undermining as well slightly moisten with saline Wound #9 Left Elbow: Silver Collagen - moisten with hydrogel Secondary Dressing: Wound #1 Midline Coccyx: Boardered Foam Dressing - HHRN PLEASE ORDER ALLEVYN LIFE BORDERED FOAM FOR PATIENT (OFF BRANDS IRRITATE HER SKIN) Drawtex - HHRN to provide this for patient Wound #9 Left Elbow: Dry Gauze - secure with  tape Foam Dressing Change Frequency: Wound #1 Midline Coccyx: Change dressing every day. Wound #9 Left Elbow: Change dressing every day. Follow-up Appointments: Return Appointment in 2 weeks. Off-Loading: Wound #1 Midline Coccyx: Roho cushion for wheelchair Mattress - fluidized air mattress Turn and reposition every 2 hours Additional Orders / Instructions: Other: - HHRN - Right hip foam and telfa island change every other day Wound #1 Midline Coccyx: Increase protein intake. - please add protein supplements to patients diet Other: - please add vitamin A, vitamin C and zinc supplements to patients diet Home Health: Wound #1 Midline Coccyx: Continue Home Health Visits - [facility] Nurse may visit PRN to address patient s wound care needs. FACE TO FACE ENCOUNTER: MEDICARE and MEDICAID PATIENTS: I certify that this patient is under my care and that I had a face-to-face encounter that meets the physician face-to-face encounter requirements with this patient on this date. The encounter with the patient was in whole or in part for the following MEDICAL CONDITION: (primary reason for Home Healthcare) MEDICAL NECESSITY: I certify, that based on my findings, NURSING services are a medically necessary home health service. HOME BOUND STATUS: I certify that my clinical findings support that this patient is homebound (i.e., Due to illness or injury, pt requires aid of supportive devices such as crutches, cane, wheelchairs, walkers, the use of special transportation or the assistance of another person to leave their place of residence. There is a normal inability to leave the home and doing so requires considerable and  taxing effort. Other absences are for medical reasons / religious services and are infrequent or of short duration when for other reasons). If current dressing causes regression in wound condition, may D/C ordered dressing product/s and apply Normal Saline Moist Dressing  daily until next Wound Healing Center / Other MD appointment. Notify Wound Healing Center of regression in wound condition at [phone]. KEILA, TURAN ([id number]) Please direct any NON-WOUND related issues/requests for orders to patient's Primary Care Physician - Dr Vonita Moss Wound #9 Left Elbow: Continue Home Health Visits - [facility] Nurse may visit PRN to address patient s wound care needs. FACE TO FACE ENCOUNTER: MEDICARE and MEDICAID PATIENTS: I certify that this patient is under my care and that I had a face-to-face encounter that meets the physician face-to-face encounter requirements with this patient on this date. The encounter with the patient was in whole or in part for the following MEDICAL CONDITION: (primary reason for Home Healthcare) MEDICAL NECESSITY: I certify, that based on my findings, NURSING services are a medically necessary home health service. HOME BOUND STATUS: I certify that my clinical findings support that this patient is homebound (i.e., Due to illness or injury, pt requires aid of supportive devices such as crutches, cane, wheelchairs, walkers, the use of special transportation or the assistance of another person to leave their place of residence. There is a normal inability to leave the home and doing so requires considerable and taxing effort. Other absences are for medical reasons / religious services and are infrequent or of short duration when for other reasons). If current dressing causes regression in wound condition, may D/C ordered dressing product/s and apply Normal Saline Moist Dressing daily until next Wound Healing Center / Other MD appointment. Notify Wound Healing Center of regression in wound condition at [phone]. Please direct any NON-WOUND related issues/requests for orders to patient's Primary Care Physician - Dr Vonita Moss At this point I'm going to suggest that we go ahead and initiate treatment with Prisma to the  left elbow we will continue with the Prisma to the sacral as well. We will see were things stand at follow-up. Hopefully she will continue to show signs of good improvement. Please see above for specific wound care orders. We will see patient for re-evaluation in 1 week(s) here in the clinic. If anything worsens or changes patient will contact our office for additional recommendations. Electronic Signature(s) Signed: 03/24/2018 12:21:38 AM By: Lenda Kelp PA-C Entered By: Lenda Kelp on 03/23/2018 13:05:31 [name] ([id number]) -------------------------------------------------------------------------------- ROS/PFSH Details Patient Name: [name] Date of Service: 03/23/2018 10:15 AM Medical Record Number: [id number] Patient Account Number: [id number] Date of Birth/Sex: Jan 19, 1923 (84 y.o. F) Treating RN: Curtis Sites Primary Care Provider: Vonita Moss Other Clinician: Referring Provider: Vonita Moss Treating Provider/Extender: Linwood Dibbles, Bindu Docter Weeks in Treatment: 51 Unable to Obtain Patient History due to oo Altered Mental Status Information Obtained From Patient Wound History Do you currently have one or more open woundso Yes How many open wounds do you currently haveo 1 Approximately how long have you had your woundso 1 week How have you been treating your wound(s) until nowo santyl and gauze Has your wound(s) ever healed and then re-openedo No Have you had any lab work done in the past montho No Have you tested positive for an antibiotic resistant organism (MRSA, VRE)o No Have you tested positive for osteomyelitis (bone infection)o No Have you had any tests for circulation on your legso No Constitutional Symptoms (  General Health) Complaints and Symptoms: Negative for: Fever; Chills Eyes Medical History: Negative for: Cataracts; Glaucoma; Optic Neuritis Ear/Nose/Mouth/Throat Medical History: Negative for: Chronic sinus problems/congestion;  Middle ear problems Past Medical History Notes: dysphagia Hematologic/Lymphatic Medical History: Positive for: Anemia Negative for: Hemophilia; Human Immunodeficiency Virus; Lymphedema; Sickle Cell Disease Respiratory Complaints and Symptoms: No Complaints or Symptoms Medical History: Negative for: Aspiration; Asthma; Chronic Obstructive Pulmonary Disease (COPD); Pneumothorax; Sleep Apnea; Tuberculosis Cardiovascular MARGREAT, WIDENER. ([id number]) Complaints and Symptoms: No Complaints or Symptoms Medical History: Positive for: Hypertension Negative for: Angina; Arrhythmia; Congestive Heart Failure; Coronary Artery Disease; Deep Vein Thrombosis; Hypotension; Myocardial Infarction; Peripheral Arterial Disease; Peripheral Venous Disease; Phlebitis; Vasculitis Gastrointestinal Medical History: Negative for: Cirrhosis ; Colitis; Crohnos; Hepatitis A; Hepatitis B; Hepatitis C Endocrine Medical History: Negative for: Type I Diabetes; Type II Diabetes Genitourinary Medical History: Negative for: End Stage Renal Disease Immunological Medical History: Negative for: Lupus Erythematosus; Raynaudos; Scleroderma Integumentary (Skin) Medical History: Negative for: History of Burn; History of pressure wounds Musculoskeletal Medical History: Negative for: Gout; Rheumatoid Arthritis; Osteoarthritis; Osteomyelitis Neurologic Medical History: Positive for: Dementia Negative for: Neuropathy; Quadriplegia; Paraplegia; Seizure Disorder Past Medical History Notes: parkinsons, tremor Oncologic Medical History: Negative for: Received Chemotherapy; Received Radiation Psychiatric Complaints and Symptoms: No Complaints or Symptoms Medical History: Negative for: Anorexia/bulimia; Confinement Anxiety RAILEIGH, SABATER ([id number]) Immunizations Pneumococcal Vaccine: Received Pneumococcal Vaccination: Yes Immunization Notes: up to date Implantable Devices Family and Social History Never  smoker; Marital Status - Married; Alcohol Use: Never; Drug Use: No History; Caffeine Use: Never; Financial Concerns: No; Food, Clothing or Shelter Needs: No; Support System Lacking: No; Transportation Concerns: No; Advanced Directives: Yes (Not Provided); Patient does not want information on Advanced Directives; Medical Power of Attorney: Yes - dtr (Not Provided) Physician Affirmation I have reviewed and agree with the above information. Electronic Signature(s) Signed: 03/23/2018 5:42:13 PM By: Curtis Sites Signed: 03/24/2018 12:21:38 AM By: Lenda Kelp PA-C Entered By: Lenda Kelp on 03/23/2018 11:55:41 [name] ([id number]) -------------------------------------------------------------------------------- SuperBill Details Patient Name: [name] Date of Service: 03/23/2018 Medical Record Number: [id number] Patient Account Number: [id number] Date of Birth/Sex: 10-12-1922 (84 y.o. F) Treating RN: Curtis Sites Primary Care Provider: Vonita Moss Other Clinician: Referring Provider: Vonita Moss Treating Provider/Extender: Linwood Dibbles, Ailah Barna Weeks in Treatment: 62 Diagnosis Coding ICD-10 Codes Code Description L89.153 Pressure ulcer of sacral region, stage 3 L89.023 Pressure ulcer of left elbow, stage 3 G20 Parkinson's disease L03.312 Cellulitis of back [any part except buttock] S81.811D Laceration without foreign body, right lower leg, subsequent encounter Facility Procedures CPT4 Code: 62952841 Description: 11042 - DEB SUBQ TISSUE 20 SQ CM/< ICD-10 Diagnosis Description L89.023 Pressure ulcer of left elbow, stage 3 Modifier: Quantity: 1 Physician Procedures CPT4 Code: 3244010 Description: 11042 - WC PHYS SUBQ TISS 20 SQ CM ICD-10 Diagnosis Description L89.023 Pressure ulcer of left elbow, stage 3 Modifier: Quantity: 1 Electronic Signature(s) Signed: 03/24/2018 12:21:38 AM By: Lenda Kelp PA-C Entered By: Lenda Kelp on 03/23/2018 13:05:39

## 2018-03-25 NOTE — Progress Notes (Signed)
REX, MAGEE ([id number]) Visit Report for 03/23/2018 Arrival Information Details Patient Name: [name], [name] Date of Service: 03/23/2018 10:15 AM Medical Record Number: [id number] Patient Account Number: [id number] Date of Birth/Sex: 04-04-1923 (84 y.o. F) Treating RN: Rema Jasmine Primary Care Sang Blount: Vonita Moss Other Clinician: Referring Chizaram Latino: Vonita Moss Treating Kyren Knick/Extender: Linwood Dibbles, HOYT Weeks in Treatment: 62 Visit Information History Since Last Visit Added or deleted any medications: No Patient Arrived: Wheel Chair Any new allergies or adverse reactions: No Arrival Time: 10:22 Had a fall or experienced change in No Accompanied By: dtr and activities of daily living that may affect caregiver risk of falls: Transfer Assistance: Other Signs or symptoms of abuse/neglect since last visito No Patient Identification Verified: Yes Hospitalized since last visit: No Secondary Verification Process Completed: Yes Implantable device outside of the clinic excluding No Patient Requires Transmission-Based No cellular tissue based products placed in the center Precautions: since last visit: Patient Has Alerts: No Has Dressing in Place as Prescribed: Yes Pain Present Now: No Notes dtr and caregiver assisted pt on to stretcher Electronic Signature(s) Signed: 03/23/2018 11:20:16 AM By: Rema Jasmine Entered By: Rema Jasmine on 03/23/2018 10:24:20 [name] ([id number]) -------------------------------------------------------------------------------- Encounter Discharge Information Details Patient Name: [name] Date of Service: 03/23/2018 10:15 AM Medical Record Number: [id number] Patient Account Number: [id number] Date of Birth/Sex: 12-16-1922 (84 y.o. F) Treating RN: Renne Crigler Primary Care Darrien Belter: Vonita Moss Other Clinician: Referring Cabrini Ruggieri: Vonita Moss Treating Kendrick Remigio/Extender: Linwood Dibbles, HOYT Weeks in Treatment: 101 Encounter  Discharge Information Items Discharge Condition: Stable Ambulatory Status: Wheelchair Discharge Destination: Home Transportation: Private Auto Schedule Follow-up Appointment: Yes Clinical Summary of Care: Electronic Signature(s) Signed: 03/23/2018 4:47:28 PM By: Renne Crigler Entered By: Renne Crigler on 03/23/2018 11:40:22 [name] ([id number]) -------------------------------------------------------------------------------- Lower Extremity Assessment Details Patient Name: [name] Date of Service: 03/23/2018 10:15 AM Medical Record Number: [id number] Patient Account Number: [id number] Date of Birth/Sex: 09/25/1923 (84 y.o. F) Treating RN: Rema Jasmine Primary Care Samina Weekes: Vonita Moss Other Clinician: Referring Saifullah Jolley: Vonita Moss Treating Lunden Stieber/Extender: Linwood Dibbles, HOYT Weeks in Treatment: 62 Electronic Signature(s) Signed: 03/23/2018 11:20:16 AM By: Rema Jasmine Entered By: Rema Jasmine on 03/23/2018 10:49:58 [name] ([id number]) -------------------------------------------------------------------------------- Multi Wound Chart Details Patient Name: [name] Date of Service: 03/23/2018 10:15 AM Medical Record Number: [id number] Patient Account Number: [id number] Date of Birth/Sex: 03-Aug-1923 (84 y.o. F) Treating RN: Curtis Sites Primary Care Aiyannah Fayad: Vonita Moss Other Clinician: Referring Skylynn Burkley: Vonita Moss Treating Kerline Trahan/Extender: Linwood Dibbles, HOYT Weeks in Treatment: 62 Vital Signs Height(in): 60 Pulse(bpm): 64 Weight(lbs): 90 Blood Pressure(mmHg): 118/65 Body Mass Index(BMI): 18 Temperature(F): 97.7 Respiratory Rate 18 (breaths/min): Photos: Wound Location: Coccyx - Midline Right, Posterior Knee Left Elbow Wounding Event: Pressure Injury Pressure Injury Trauma Primary Etiology: Pressure Ulcer Pressure Ulcer Trauma, Other Comorbid History: Anemia, Hypertension, N/A Anemia, Hypertension, Dementia Dementia Date  Acquired: 12/01/2016 02/12/2018 02/16/2018 Weeks of Treatment: 62 4 4 Wound Status: Open Open Open Measurements L x W x D 4.8x1.8x0.7 0x0x0 2.3x1.9x0.1 (cm) Area (cm) : 6.786 0 3.432 Volume (cm) : 4.75 0 0.343 % Reduction in Area: -63.30% 100.00% -183.60% % Reduction in Volume: -185.80% 100.00% -183.50% Starting Position 1 6 (o'clock): Ending Position 1 8 (o'clock): Maximum Distance 1 (cm): 1.2 Undermining: Yes N/A No Classification: Category/Stage IV Category/Stage II Full Thickness Without Exposed Support Structures Exudate Amount: Large N/A Medium Exudate Type: Serosanguineous N/A Serosanguineous Exudate Color: red, brown N/A red, brown Foul Odor After Cleansing: Yes N/A No Odor Anticipated Due  to No N/A N/A Product Use: Wound Margin: Flat and Intact N/A Distinct, outline attached Granulation Amount: Large (67-100%) N/A Large (67-100%) Granulation Quality: Red, Hyper-granulation N/A Red, [name], AUNE. ([id number]) Necrotic Amount: Small (1-33%) N/A Small (1-33%) Exposed Structures: Fat Layer (Subcutaneous N/A Fascia: No Tissue) Exposed: Yes Fat Layer (Subcutaneous Muscle: Yes Tissue) Exposed: No Fascia: No Tendon: No Tendon: No Muscle: No Joint: No Joint: No Bone: No Bone: No Epithelialization: None N/A None Periwound Skin Texture: Induration: Yes No Abnormalities Noted No Abnormalities Noted Rash: Yes Excoriation: No Callus: No Crepitus: No Scarring: No Periwound Skin Moisture: Maceration: Yes No Abnormalities Noted Dry/Scaly: Yes Dry/Scaly: No Periwound Skin Color: Erythema: Yes No Abnormalities Noted Erythema: Yes Atrophie Blanche: No Cyanosis: No Ecchymosis: No Hemosiderin Staining: No Mottled: No Pallor: No Rubor: No Erythema Location: Circumferential N/A Circumferential Temperature: No Abnormality N/A No Abnormality Tenderness on Palpation: Yes No Yes Wound Preparation: Ulcer Cleansing: N/A Ulcer Cleansing: Rinsed/Irrigated with  Saline Rinsed/Irrigated with Saline Topical Anesthetic Applied: Topical Anesthetic Applied: Other: lidocaine 4% None, Other: lidocaine 4% Treatment Notes Electronic Signature(s) Signed: 03/23/2018 5:42:13 PM By: Curtis Sites Entered By: Curtis Sites on 03/23/2018 11:12:18 [name] ([id number]) -------------------------------------------------------------------------------- Multi-Disciplinary Care Plan Details Patient Name: [name] Date of Service: 03/23/2018 10:15 AM Medical Record Number: [id number] Patient Account Number: [id number] Date of Birth/Sex: 10-25-1922 (84 y.o. F) Treating RN: Curtis Sites Primary Care Zeriah Baysinger: Vonita Moss Other Clinician: Referring Sakai Wolford: Vonita Moss Treating Corian Handley/Extender: Linwood Dibbles, HOYT Weeks in Treatment: 13 Active Inactive ` Abuse / Safety / Falls / Self Care Management Nursing Diagnoses: Impaired physical mobility Potential for falls Goals: Patient will remain injury free Date Initiated: 01/07/2017 Target Resolution Date: 04/03/2017 Goal Status: Active Interventions: Assess fall risk on admission and as needed Notes: ` Nutrition Nursing Diagnoses: Potential for alteratiion in Nutrition/Potential for imbalanced nutrition Goals: Patient/caregiver agrees to and verbalizes understanding of need to use nutritional supplements and/or vitamins as prescribed Date Initiated: 01/07/2017 Target Resolution Date: 04/03/2017 Goal Status: Active Interventions: Assess patient nutrition upon admission and as needed per policy Notes: ` Orientation to the Wound Care Program Nursing Diagnoses: Knowledge deficit related to the wound healing center program Goals: Patient/caregiver will verbalize understanding of the Wound Healing Center Program Date Initiated: 01/07/2017 Target Resolution Date: 04/03/2017 Goal Status: Active [name], [name] ([id number]) Interventions: Provide education on orientation to the wound  center Notes: ` Pressure Nursing Diagnoses: Knowledge deficit related to causes and risk factors for pressure ulcer development Goals: Patient will remain free from development of additional pressure ulcers Date Initiated: 01/07/2017 Target Resolution Date: 04/03/2017 Goal Status: Active Interventions: Assess potential for pressure ulcer upon admission and as needed Notes: ` Wound/Skin Impairment Nursing Diagnoses: Impaired tissue integrity Goals: Patient/caregiver will verbalize understanding of skin care regimen Date Initiated: 01/07/2017 Target Resolution Date: 04/03/2017 Goal Status: Active Ulcer/skin breakdown will have a volume reduction of 30% by week 4 Date Initiated: 01/07/2017 Target Resolution Date: 04/03/2017 Goal Status: Active Ulcer/skin breakdown will have a volume reduction of 50% by week 8 Date Initiated: 01/07/2017 Target Resolution Date: 04/03/2017 Goal Status: Active Ulcer/skin breakdown will have a volume reduction of 80% by week 12 Date Initiated: 01/07/2017 Target Resolution Date: 04/03/2017 Goal Status: Active Ulcer/skin breakdown will heal within 14 weeks Date Initiated: 01/07/2017 Target Resolution Date: 04/03/2017 Goal Status: Active Interventions: Assess patient/caregiver ability to obtain necessary supplies Assess patient/caregiver ability to perform ulcer/skin care regimen upon admission and as needed Assess ulceration(s) every visit Notes: Electronic Signature(s) [name], [name] (  [id number]) Signed: 03/23/2018 5:42:13 PM By: Curtis Sites Entered By: Curtis Sites on 03/23/2018 11:12:06 [name] ([id number]) -------------------------------------------------------------------------------- Pain Assessment Details Patient Name: [name] Date of Service: 03/23/2018 10:15 AM Medical Record Number: [id number] Patient Account Number: [id number] Date of Birth/Sex: 28-Nov-1922 (84 y.o. F) Treating RN: Rema Jasmine Primary Care Shelbe Haglund:  Vonita Moss Other Clinician: Referring Tydarius Yawn: Vonita Moss Treating Tonna Palazzi/Extender: Linwood Dibbles, HOYT Weeks in Treatment: 52 Active Problems Location of Pain Severity and Description of Pain Patient Has Paino No Site Locations Pain Management and Medication Current Pain Management: Goals for Pain Management topical or injectable lidocaine is offered to patient for acute pain when surgical debridement is performed. if needed, Patient is instructed to use over the counter pain medication for the following 24-48 hours after debridement. wound care MDs do not prescribe pain medications. patient has chronic pain or uncontrolled pain. patient has been instructed to make appointment with their primary care physician for pain management. Electronic Signature(s) Signed: 03/23/2018 11:20:16 AM By: Rema Jasmine Entered By: Rema Jasmine on 03/23/2018 10:54:08 [name] ([id number]) -------------------------------------------------------------------------------- Patient/Caregiver Education Details Patient Name: [name] Date of Service: 03/23/2018 10:15 AM Medical Record Number: [id number] Patient Account Number: [id number] Date of Birth/Gender: [birth year] (84 y.o. F) Treating RN: Renne Crigler Primary Care Physician: Vonita Moss Other Clinician: Referring Physician: Vonita Moss Treating Physician/Extender: Skeet Simmer in Treatment: 29 Education Assessment Education Provided To: Patient Education Topics Provided Wound Debridement: Handouts: Wound Debridement Methods: Explain/Verbal Responses: State content correctly Wound/Skin Impairment: Handouts: Caring for Your Ulcer Methods: Explain/Verbal Responses: State content correctly Electronic Signature(s) Signed: 03/23/2018 4:47:28 PM By: Renne Crigler Entered By: Renne Crigler on 03/23/2018 11:40:44 [name]  ([id number]) -------------------------------------------------------------------------------- Wound Assessment Details Patient Name: [name] Date of Service: 03/23/2018 10:15 AM Medical Record Number: [id number] Patient Account Number: [id number] Date of Birth/Sex: Jan 12, 1923 (84 y.o. F) Treating RN: Rema Jasmine Primary Care Jacaden Forbush: Vonita Moss Other Clinician: Referring Oluwaferanmi Wain: Vonita Moss Treating Lavert Matousek/Extender: Linwood Dibbles, HOYT Weeks in Treatment: 62 Wound Status Wound Number: 1 Primary Etiology: Pressure Ulcer Wound Location: Coccyx - Midline Wound Status: Open Wounding Event: Pressure Injury Comorbid History: Anemia, Hypertension, Dementia Date Acquired: 12/01/2016 Weeks Of Treatment: 62 Clustered Wound: No Photos Photo Uploaded By: Rema Jasmine on 03/23/2018 10:52:49 Wound Measurements Length: (cm) 4.8 % Reduction Width: (cm) 1.8 % Reduction Depth: (cm) 0.7 Epitheliali Area: (cm) 6.786 Tunneling: Volume: (cm) 4.75 Underminin Starting Ending P Maximum in Area: -63.3% in Volume: -185.8% zation: None No g: Yes Position (o'clock): 6 osition (o'clock): 8 Distance: (cm) 1.2 Wound Description Classification: Category/Stage IV Foul Odor Wound Margin: Flat and Intact Due to Pro Exudate Amount: Large Slough/Fib Exudate Type: Serosanguineous Exudate Color: red, brown After Cleansing: Yes duct Use: No rino Yes Wound Bed Granulation Amount: Large (67-100%) Exposed Structure Granulation Quality: Red, Hyper-granulation Fascia Exposed: No Necrotic Amount: Small (1-33%) Fat Layer (Subcutaneous Tissue) Exposed: Yes Necrotic Quality: Adherent Slough Tendon Exposed: No Muscle Exposed: Yes Necrosis of Muscle: No Joint Exposed: No Bone Exposed: No [name], FULGHUM. ([id number]) Periwound Skin Texture Texture Color No Abnormalities Noted: No No Abnormalities Noted: No Callus: No Atrophie Blanche: No Crepitus: No Cyanosis: No Excoriation:  No Ecchymosis: No Induration: Yes Erythema: Yes Rash: Yes Erythema Location: Circumferential Scarring: No Hemosiderin Staining: No Mottled: No Moisture Pallor: No No Abnormalities Noted: No Rubor: No Dry / Scaly: No Maceration: Yes Temperature / Pain Temperature: No Abnormality Tenderness on Palpation: Yes Wound Preparation Ulcer Cleansing: Rinsed/Irrigated with Saline Topical  Anesthetic Applied: Other: lidocaine 4%, Treatment Notes Wound #1 (Midline Coccyx) 1. Cleansed with: Clean wound with Normal Saline 2. Anesthetic Topical Lidocaine 4% cream to wound bed prior to debridement 4. Dressing Applied: Prisma Ag 5. Secondary Dressing Applied Bordered Foam Dressing Notes drawtex Electronic Signature(s) Signed: 03/23/2018 11:20:16 AM By: Rema Jasmine Entered By: Rema Jasmine on 03/23/2018 10:37:02 [name] ([id number]) -------------------------------------------------------------------------------- Wound Assessment Details Patient Name: [name] Date of Service: 03/23/2018 10:15 AM Medical Record Number: [id number] Patient Account Number: [id number] Date of Birth/Sex: Jun 11, 1923 (84 y.o. F) Treating RN: Rema Jasmine Primary Care Jesyka Slaght: Vonita Moss Other Clinician: Referring Nechelle Petrizzo: Vonita Moss Treating Cherilynn Schomburg/Extender: Linwood Dibbles, HOYT Weeks in Treatment: 62 Wound Status Wound Number: 8 Primary Etiology: Pressure Ulcer Wound Location: Right, Posterior Knee Wound Status: Open Wounding Event: Pressure Injury Date Acquired: 02/12/2018 Weeks Of Treatment: 4 Clustered Wound: No Photos Photo Uploaded By: Rema Jasmine on 03/23/2018 10:52:49 Wound Measurements Length: (cm) 0 % Red Width: (cm) 0 % Red Depth: (cm) 0 Area: (cm) 0 Volume: (cm) 0 uction in Area: 100% uction in Volume: 100% Wound Description Classification: Category/Stage II Periwound Skin Texture Texture Color No Abnormalities Noted: No No Abnormalities Noted: No Moisture No  Abnormalities Noted: No Electronic Signature(s) Signed: 03/23/2018 11:20:16 AM By: Rema Jasmine Entered By: Rema Jasmine on 03/23/2018 10:46:59 [name] ([id number]) -------------------------------------------------------------------------------- Wound Assessment Details Patient Name: [name] Date of Service: 03/23/2018 10:15 AM Medical Record Number: [id number] Patient Account Number: [id number] Date of Birth/Sex: 12/17/1922 (84 y.o. F) Treating RN: Rema Jasmine Primary Care Neisha Hinger: Vonita Moss Other Clinician: Referring Marialice Newkirk: Vonita Moss Treating Devan Danzer/Extender: Linwood Dibbles, HOYT Weeks in Treatment: 62 Wound Status Wound Number: 9 Primary Etiology: Trauma, Other Wound Location: Left Elbow Wound Status: Open Wounding Event: Trauma Comorbid History: Anemia, Hypertension, Dementia Date Acquired: 02/16/2018 Weeks Of Treatment: 4 Clustered Wound: No Photos Photo Uploaded By: Rema Jasmine on 03/23/2018 10:53:08 Wound Measurements Length: (cm) 2.3 Width: (cm) 1.9 Depth: (cm) 0.1 Area: (cm) 3.432 Volume: (cm) 0.343 % Reduction in Area: -183.6% % Reduction in Volume: -183.5% Epithelialization: None Tunneling: No Undermining: No Wound Description Full Thickness Without Exposed Support Foul Od Classification: Structures Slough/ Wound Margin: Distinct, outline attached Exudate Medium Amount: Exudate Type: Serosanguineous Exudate Color: red, brown or After Cleansing: No Fibrino Yes Wound Bed Granulation Amount: Large (67-100%) Exposed Structure Granulation Quality: Red, Pink Fascia Exposed: No Necrotic Amount: Small (1-33%) Fat Layer (Subcutaneous Tissue) Exposed: No Tendon Exposed: No Muscle Exposed: No Joint Exposed: No Bone Exposed: No Periwound Skin Texture Texture Color [name], BALE. ([id number]) No Abnormalities Noted: No No Abnormalities Noted: No Erythema: Yes Moisture Erythema Location: Circumferential No Abnormalities Noted:  No Dry / Scaly: Yes Temperature / Pain Temperature: No Abnormality Tenderness on Palpation: Yes Wound Preparation Ulcer Cleansing: Rinsed/Irrigated with Saline Topical Anesthetic Applied: None, Other: lidocaine 4%, Treatment Notes Wound #9 (Left Elbow) 1. Cleansed with: Clean wound with Normal Saline 2. Anesthetic Topical Lidocaine 4% cream to wound bed prior to debridement 4. Dressing Applied: Hydrogel Prisma Ag 5. Secondary Dressing Applied Foam Notes paper tape Electronic Signature(s) Signed: 03/23/2018 11:20:16 AM By: Rema Jasmine Entered By: Rema Jasmine on 03/23/2018 10:45:35 [name] ([id number]) -------------------------------------------------------------------------------- Vitals Details Patient Name: [name] Date of Service: 03/23/2018 10:15 AM Medical Record Number: [id number] Patient Account Number: [id number] Date of Birth/Sex: [birth year] (84 y.o. F) Treating RN: Rema Jasmine Primary Care Kimari Lienhard: Vonita Moss Other Clinician: Referring Brecklynn Jian: Vonita Moss Treating Samel Bruna/Extender: STONE III, HOYT Weeks in Treatment:  62 Vital Signs Time Taken: 10:25 Temperature (F): 97.7 Height (in): 60 Pulse (bpm): 64 Weight (lbs): 90 Respiratory Rate (breaths/min): 18 Body Mass Index (BMI): 17.6 Blood Pressure (mmHg): 118/65 Reference Range: 80 - 120 mg / dl Electronic Signature(s) Signed: 03/23/2018 11:20:16 AM By: Rema JasmineNg, Wendi Entered By: Rema JasmineNg, Wendi on 03/23/2018 10:54:18

## 2018-04-06 ENCOUNTER — Ambulatory Visit: Payer: Self-pay | Admitting: *Deleted

## 2018-04-06 ENCOUNTER — Encounter: Payer: Medicare Other | Attending: Physician Assistant | Admitting: Physician Assistant

## 2018-04-06 ENCOUNTER — Other Ambulatory Visit
Admission: RE | Admit: 2018-04-06 | Discharge: 2018-04-06 | Disposition: A | Discharge status: Home / Self Care | Payer: Medicare Other | Source: Ambulatory Visit | Attending: Physician Assistant | Admitting: Physician Assistant

## 2018-04-06 DIAGNOSIS — B999 Unspecified infectious disease: Secondary | ICD-10-CM | POA: Insufficient documentation

## 2018-04-06 DIAGNOSIS — L89023 Pressure ulcer of left elbow, stage 3: Secondary | ICD-10-CM | POA: Diagnosis not present

## 2018-04-06 DIAGNOSIS — I10 Essential (primary) hypertension: Secondary | ICD-10-CM | POA: Diagnosis not present

## 2018-04-06 DIAGNOSIS — L89153 Pressure ulcer of sacral region, stage 3: Secondary | ICD-10-CM | POA: Diagnosis not present

## 2018-04-06 DIAGNOSIS — F0390 Unspecified dementia without behavioral disturbance: Secondary | ICD-10-CM | POA: Insufficient documentation

## 2018-04-06 DIAGNOSIS — D649 Anemia, unspecified: Secondary | ICD-10-CM | POA: Diagnosis not present

## 2018-04-06 NOTE — Telephone Encounter (Signed)
Pt's daughter called to report left eye discharge since July 7th. Caregiver stated that her eye discharge is more than the usual amount she has every morning. Daughter stated that the outer eye is not swollen just the eye lids. Pt is unable to verbalize if there is blurry vision or being able to see clearly. Daughter states that she does not feel there is any pain involved. Pt does not wear contacts and daughter denies any fever, runny nose or cough.   Tried lubricating eye drops to eye for 3 days but no change. Daughter is hoping for a prescription to be called in due to difficulty in mobility. Daughter stated pt is allergic Sulfa. Routing note to office. Reason for Disposition . [1] Eye with yellow/green discharge or eyelashes stick together AND [2] NO PCP standing order to call in antibiotic eye drops  Answer Assessment - Initial Assessment Questions 1. EYE DISCHARGE: "Is the discharge in one or both eyes?" "What color is it?" "How much is there?" "When did the discharge start?"      Left eye only-pale yellow discharge- more drainage during the day than usual amount in the am- July 5 2. REDNESS OF SCLERA: "Is the redness in one or both eyes?" "When did the redness start?"      One eye Left eye- redness started July 7 3. EYELIDS: "Are the eyelids red or swollen?" If so, ask: "How much?"      The outer eye is not swollen  But if you open her lids that is where the swelling is  4. VISION: "Is there any difficulty seeing clearly?"      Unable to verbalize 5. PAIN: "Is there any pain? If so, ask: "How bad is it?" (Scale 1-10; or mild, moderate, severe)    - MILD (1-3): doesn't interfere with normal activities     - MODERATE (4-7): interferes with normal activities or awakens from sleep    - SEVERE (8-10): excruciating pain, unable to do any normal activities       No pain 6. CONTACT LENS: "Do you wear contacts?"     no 7. OTHER SYMPTOMS: "Do you have any other symptoms?" (e.g., fever, runny nose,  cough)     no 8. PREGNANCY: "Is there any chance you are pregnant?" "When was your last menstrual period?"     n/a  Protocols used: EYE - PUS OR DISCHARGE-A-AH

## 2018-04-07 MED ORDER — CIPROFLOXACIN HCL 0.3 % OP SOLN: 1.0000 [drp] | OPHTHALMIC | 0 refills | Status: DC

## 2018-04-07 NOTE — Telephone Encounter (Signed)
Please advise 

## 2018-04-07 NOTE — Telephone Encounter (Signed)
rx sent in 

## 2018-04-07 NOTE — Progress Notes (Signed)
OMAH, DEWALT ([id number]) Visit Report for 04/06/2018 Arrival Information Details Patient Name: KELLAN, BOEHLKE Date of Service: 04/06/2018 10:15 AM Medical Record Number: [id number] Patient Account Number: [id number] Date of Birth/Sex: 07/15/1923 (84 y.o. F) Treating RN: Rema Jasmine Primary Care Zafar Debrosse: Vonita Moss Other Clinician: Referring Quenten Nawaz: Vonita Moss Treating Gizzelle Lacomb/Extender: Linwood Dibbles, HOYT Weeks in Treatment: 73 Visit Information History Since Last Visit Added or deleted any medications: No Patient Arrived: Wheel Chair Any new allergies or adverse reactions: No Arrival Time: 10:52 Had a fall or experienced change in No activities of daily living that may affect Accompanied By: dtr risk of falls: Transfer Assistance: Other Signs or symptoms of abuse/neglect since last visito No Patient Identification Verified: Yes Hospitalized since last visit: No Secondary Verification Process Completed: Yes Implantable device outside of the clinic excluding No Patient Requires Transmission-Based No cellular tissue based products placed in the center Precautions: since last visit: Patient Has Alerts: No Has Dressing in Place as Prescribed: Yes Pain Present Now: No Electronic Signature(s) Signed: 04/06/2018 4:03:40 PM By: Rema Jasmine Entered By: Rema Jasmine on 04/06/2018 10:53:15 [name] ([id number]) -------------------------------------------------------------------------------- Clinic Level of Care Assessment Details Patient Name: [name] Date of Service: 04/06/2018 10:15 AM Medical Record Number: [id number] Patient Account Number: [id number] Date of Birth/Sex: 04/15/1923 (84 y.o. F) Treating RN: Phillis Haggis Primary Care Emmy Keng: Vonita Moss Other Clinician: Referring Lelynd Poer: Vonita Moss Treating Traylon Schimming/Extender: Linwood Dibbles, HOYT Weeks in Treatment: 61 Clinic Level of Care Assessment Items TOOL 4 Quantity Score X - Use when only an  EandM is performed on FOLLOW-UP visit 1 0 ASSESSMENTS - Nursing Assessment / Reassessment X - Reassessment of Co-morbidities (includes updates in patient status) 1 10 X- 1 5 Reassessment of Adherence to Treatment Plan ASSESSMENTS - Wound and Skin Assessment / Reassessment []  - Simple Wound Assessment / Reassessment - one wound 0 X- 3 5 Complex Wound Assessment / Reassessment - multiple wounds []  - 0 Dermatologic / Skin Assessment (not related to wound area) ASSESSMENTS - Focused Assessment []  - Circumferential Edema Measurements - multi extremities 0 []  - 0 Nutritional Assessment / Counseling / Intervention []  - 0 Lower Extremity Assessment (monofilament, tuning fork, pulses) []  - 0 Peripheral Arterial Disease Assessment (using hand held doppler) ASSESSMENTS - Ostomy and/or Continence Assessment and Care []  - Incontinence Assessment and Management 0 []  - 0 Ostomy Care Assessment and Management (repouching, etc.) PROCESS - Coordination of Care []  - Simple Patient / Family Education for ongoing care 0 X- 1 20 Complex (extensive) Patient / Family Education for ongoing care X- 1 10 Staff obtains Chiropractor, Records, Test Results / Process Orders X- 1 10 Staff telephones HHA, Nursing Homes / Clarify orders / etc []  - 0 Routine Transfer to another Facility (non-emergent condition) []  - 0 Routine Hospital Admission (non-emergent condition) []  - 0 New Admissions / Manufacturing engineer / Ordering NPWT, Apligraf, etc. []  - 0 Emergency Hospital Admission (emergent condition) X- 1 10 Simple Discharge Coordination NATARA, MONFORT ([id number]) []  - 0 Complex (extensive) Discharge Coordination PROCESS - Special Needs []  - Pediatric / Minor Patient Management 0 []  - 0 Isolation Patient Management []  - 0 Hearing / Language / Visual special needs []  - 0 Assessment of Community assistance (transportation, D/C planning, etc.) []  - 0 Additional assistance / Altered mentation []  -  0 Support Surface(s) Assessment (bed, cushion, seat, etc.) INTERVENTIONS - Wound Cleansing / Measurement []  - Simple Wound Cleansing - one wound 0 X- 3 5 Complex Wound Cleansing -  multiple wounds X- 1 5 Wound Imaging (photographs - any number of wounds) []  - 0 Wound Tracing (instead of photographs) []  - 0 Simple Wound Measurement - one wound X- 3 5 Complex Wound Measurement - multiple wounds INTERVENTIONS - Wound Dressings X - Small Wound Dressing one or multiple wounds 3 10 []  - 0 Medium Wound Dressing one or multiple wounds []  - 0 Large Wound Dressing one or multiple wounds X- 1 5 Application of Medications - topical []  - 0 Application of Medications - injection INTERVENTIONS - Miscellaneous []  - External ear exam 0 []  - 0 Specimen Collection (cultures, biopsies, blood, body fluids, etc.) []  - 0 Specimen(s) / Culture(s) sent or taken to Lab for analysis []  - 0 Patient Transfer (multiple staff / Nurse, adult / Similar devices) []  - 0 Simple Staple / Suture removal (25 or less) []  - 0 Complex Staple / Suture removal (26 or more) []  - 0 Hypo / Hyperglycemic Management (close monitor of Blood Glucose) []  - 0 Ankle / Brachial Index (ABI) - do not check if billed separately X- 1 5 Vital Signs Appelhans, Kathee W. ([id number]) Has the patient been seen at the hospital within the last three years: Yes Total Score: 155 Level Of Care: New/Established - Level 4 Electronic Signature(s) Signed: 04/06/2018 5:13:20 PM By: Alejandro Mulling Entered By: Alejandro Mulling on 04/06/2018 14:43:06 [name] ([id number]) -------------------------------------------------------------------------------- Encounter Discharge Information Details Patient Name: [name] Date of Service: 04/06/2018 10:15 AM Medical Record Number: [id number] Patient Account Number: [id number] Date of Birth/Sex: September 21, 1923 (84 y.o. F) Treating RN: Renne Crigler Primary Care Sedale Jenifer: Vonita Moss  Other Clinician: Referring Shakthi Scipio: Vonita Moss Treating Terre Hanneman/Extender: Linwood Dibbles, HOYT Weeks in Treatment: 36 Encounter Discharge Information Items Discharge Condition: Stable Ambulatory Status: Wheelchair Discharge Destination: Home Transportation: Private Auto Accompanied By: daughter Schedule Follow-up Appointment: Yes Clinical Summary of Care: Electronic Signature(s) Signed: 04/06/2018 4:55:40 PM By: Renne Crigler Entered By: Renne Crigler on 04/06/2018 12:04:43 [name] ([id number]) -------------------------------------------------------------------------------- Lower Extremity Assessment Details Patient Name: [name] Date of Service: 04/06/2018 10:15 AM Medical Record Number: [id number] Patient Account Number: [id number] Date of Birth/Sex: [birth year] (84 y.o. F) Treating RN: Rema Jasmine Primary Care Juliet Vasbinder: Vonita Moss Other Clinician: Referring Hakeen Shipes: Vonita Moss Treating Cy Bresee/Extender: Linwood Dibbles, HOYT Weeks in Treatment: 33 Electronic Signature(s) Signed: 04/06/2018 4:03:40 PM By: Rema Jasmine Entered By: Rema Jasmine on 04/06/2018 10:54:40 [name] ([id number]) -------------------------------------------------------------------------------- Multi Wound Chart Details Patient Name: [name] Date of Service: 04/06/2018 10:15 AM Medical Record Number: [id number] Patient Account Number: [id number] Date of Birth/Sex: 15-Jan-1923 (84 y.o. F) Treating RN: Phillis Haggis Primary Care Marnell Mcdaniel: Vonita Moss Other Clinician: Referring Auren Valdes: Vonita Moss Treating Serin Thornell/Extender: Linwood Dibbles, HOYT Weeks in Treatment: 64 Vital Signs Height(in): 60 Pulse(bpm): 61 Weight(lbs): 90 Blood Pressure(mmHg): 147/88 Body Mass Index(BMI): 18 Temperature(F): 97.7 Respiratory Rate 16 (breaths/min): Photos: Wound Location: Coccyx - Midline Right Lower Leg - Medial Left Elbow Wounding Event: Pressure Injury Shear/Friction  Trauma Primary Etiology: Pressure Ulcer Abrasion Trauma, Other Comorbid History: Anemia, Hypertension, Anemia, Hypertension, Anemia, Hypertension, Dementia Dementia Dementia Date Acquired: 12/01/2016 03/24/2018 02/16/2018 Weeks of Treatment: 64 0 6 Wound Status: Open Open Open Measurements L x W x D 4.4x1.5x0.7 1.5x1.2x0.1 1.7x1.4x0.1 (cm) Area (cm) : 5.184 1.414 1.869 Volume (cm) : 3.629 0.141 0.187 % Reduction in Area: -24.80% 0.00% -54.50% % Reduction in Volume: -118.40% 0.00% -54.50% Starting Position 1 6 (o'clock): Ending Position 1 11 (o'clock): Maximum Distance 1 (cm): 1.5 Undermining: Yes No  No Classification: Category/Stage IV Partial Thickness Full Thickness Without Exposed Support Structures Exudate Amount: Large N/A Small Exudate Type: Serosanguineous N/A Serous Exudate Color: red, brown N/A amber Wound Margin: Flat and Intact N/A Distinct, outline attached Granulation Amount: Large (67-100%) N/A Large (67-100%) Granulation Quality: Red, Hyper-granulation N/A Red, Pink Necrotic Amount: Small (1-33%) N/A Small (1-33%) Coyt, Torra W. ([id number]) Exposed Structures: Fat Layer (Subcutaneous N/A Fascia: No Tissue) Exposed: Yes Fat Layer (Subcutaneous Muscle: Yes Tissue) Exposed: No Fascia: No Tendon: No Tendon: No Muscle: No Joint: No Joint: No Bone: No Bone: No Epithelialization: None N/A None Periwound Skin Texture: Induration: Yes Excoriation: No No Abnormalities Noted Rash: Yes Callus: No Excoriation: No Crepitus: No Callus: No Crepitus: No Scarring: No Periwound Skin Moisture: Maceration: Yes No Abnormalities Noted Dry/Scaly: Yes Dry/Scaly: No Periwound Skin Color: Erythema: Yes Erythema: Yes Erythema: Yes Atrophie Blanche: No Cyanosis: No Ecchymosis: No Hemosiderin Staining: No Mottled: No Pallor: No Rubor: No Erythema Location: Circumferential Circumferential Circumferential Temperature: No Abnormality N/A No  Abnormality Tenderness on Palpation: Yes No Yes Wound Preparation: Ulcer Cleansing: Ulcer Cleansing: Ulcer Cleansing: Rinsed/Irrigated with Saline Rinsed/Irrigated with Saline Rinsed/Irrigated with Saline Topical Anesthetic Applied: Topical Anesthetic Applied: Topical Anesthetic Applied: Other: lidocaine 4% Xylocaine 4% Topical Solution None, Other: lidocaine 4% Treatment Notes Electronic Signature(s) Signed: 04/06/2018 5:13:20 PM By: Alejandro Mulling Entered By: Alejandro Mulling on 04/06/2018 11:38:33 [name] ([id number]) -------------------------------------------------------------------------------- Multi-Disciplinary Care Plan Details Patient Name: [name] Date of Service: 04/06/2018 10:15 AM Medical Record Number: [id number] Patient Account Number: [id number] Date of Birth/Sex: 1923-06-04 (84 y.o. F) Treating RN: Phillis Haggis Primary Care Krizia Flight: Vonita Moss Other Clinician: Referring Giada Schoppe: Vonita Moss Treating Jaleyah Longhi/Extender: Linwood Dibbles, HOYT Weeks in Treatment: 68 Active Inactive ` Abuse / Safety / Falls / Self Care Management Nursing Diagnoses: Impaired physical mobility Potential for falls Goals: Patient will remain injury free Date Initiated: 01/07/2017 Target Resolution Date: 04/03/2017 Goal Status: Active Interventions: Assess fall risk on admission and as needed Notes: ` Nutrition Nursing Diagnoses: Potential for alteratiion in Nutrition/Potential for imbalanced nutrition Goals: Patient/caregiver agrees to and verbalizes understanding of need to use nutritional supplements and/or vitamins as prescribed Date Initiated: 01/07/2017 Target Resolution Date: 04/03/2017 Goal Status: Active Interventions: Assess patient nutrition upon admission and as needed per policy Notes: ` Orientation to the Wound Care Program Nursing Diagnoses: Knowledge deficit related to the wound healing center program Goals: Patient/caregiver will  verbalize understanding of the Wound Healing Center Program Date Initiated: 01/07/2017 Target Resolution Date: 04/03/2017 Goal Status: Active ROBERTO, HLAVATY ([id number]) Interventions: Provide education on orientation to the wound center Notes: ` Pressure Nursing Diagnoses: Knowledge deficit related to causes and risk factors for pressure ulcer development Goals: Patient will remain free from development of additional pressure ulcers Date Initiated: 01/07/2017 Target Resolution Date: 04/03/2017 Goal Status: Active Interventions: Assess potential for pressure ulcer upon admission and as needed Notes: ` Wound/Skin Impairment Nursing Diagnoses: Impaired tissue integrity Goals: Patient/caregiver will verbalize understanding of skin care regimen Date Initiated: 01/07/2017 Target Resolution Date: 04/03/2017 Goal Status: Active Ulcer/skin breakdown will have a volume reduction of 30% by week 4 Date Initiated: 01/07/2017 Target Resolution Date: 04/03/2017 Goal Status: Active Ulcer/skin breakdown will have a volume reduction of 50% by week 8 Date Initiated: 01/07/2017 Target Resolution Date: 04/03/2017 Goal Status: Active Ulcer/skin breakdown will have a volume reduction of 80% by week 12 Date Initiated: 01/07/2017 Target Resolution Date: 04/03/2017 Goal Status: Active Ulcer/skin breakdown will heal within 14 weeks Date Initiated: 01/07/2017 Target Resolution Date:  04/03/2017 Goal Status: Active Interventions: Assess patient/caregiver ability to obtain necessary supplies Assess patient/caregiver ability to perform ulcer/skin care regimen upon admission and as needed Assess ulceration(s) every visit Notes: Electronic Signature(s) TASMINE, HIPWELL ([id number]) Signed: 04/06/2018 5:13:20 PM By: Alejandro Mulling Entered By: Alejandro Mulling on 04/06/2018 11:38:23 [name] ([id number]) -------------------------------------------------------------------------------- Pain Assessment  Details Patient Name: [name] Date of Service: 04/06/2018 10:15 AM Medical Record Number: [id number] Patient Account Number: [id number] Date of Birth/Sex: Sep 02, 1923 (84 y.o. F) Treating RN: Rema Jasmine Primary Care Valiant Dills: Vonita Moss Other Clinician: Referring Ryleigh Buenger: Vonita Moss Treating Alinda Egolf/Extender: Linwood Dibbles, HOYT Weeks in Treatment: 43 Active Problems Location of Pain Severity and Description of Pain Patient Has Paino No Site Locations Pain Management and Medication Current Pain Management: Goals for Pain Management topical or injectable lidocaine is offered to patient for acute pain when surgical debridement is performed. if needed, Patient is instructed to use over the counter pain medication for the following 24-48 hours after debridement. wound care MDs do not prescribe pain medications. patient has chronic pain or uncontrolled pain. patient has been instructed to make appointment with their primary care physician for pain management. Notes pt dtr said her mother has no signs of pain or distress. pt non vocal Electronic Signature(s) Signed: 04/06/2018 4:03:40 PM By: Rema Jasmine Entered By: Rema Jasmine on 04/06/2018 11:29:46 [name] ([id number]) -------------------------------------------------------------------------------- Patient/Caregiver Education Details Patient Name: [name] Date of Service: 04/06/2018 10:15 AM Medical Record Number: [id number] Patient Account Number: [id number] Date of Birth/Gender: 04-Nov-1922 (84 y.o. F) Treating RN: Renne Crigler Primary Care Physician: Vonita Moss Other Clinician: Referring Physician: Vonita Moss Treating Physician/Extender: Skeet Simmer in Treatment: 63 Education Assessment Education Provided To: Patient Education Topics Provided Wound Debridement: Handouts: Wound Debridement Methods: Explain/Verbal Responses: State content correctly Wound/Skin Impairment: Handouts:  Caring for Your Ulcer Methods: Explain/Verbal Responses: State content correctly Electronic Signature(s) Signed: 04/06/2018 4:55:40 PM By: Renne Crigler Entered By: Renne Crigler on 04/06/2018 12:05:01 [name] ([id number]) -------------------------------------------------------------------------------- Wound Assessment Details Patient Name: [name] Date of Service: 04/06/2018 10:15 AM Medical Record Number: [id number] Patient Account Number: [id number] Date of Birth/Sex: 07-Aug-1923 (84 y.o. F) Treating RN: Rema Jasmine Primary Care Lasha Echeverria: Vonita Moss Other Clinician: Referring Duan Scharnhorst: Vonita Moss Treating Casyn Becvar/Extender: Linwood Dibbles, HOYT Weeks in Treatment: 64 Wound Status Wound Number: 1 Primary Etiology: Pressure Ulcer Wound Location: Coccyx - Midline Wound Status: Open Wounding Event: Pressure Injury Comorbid History: Anemia, Hypertension, Dementia Date Acquired: 12/01/2016 Weeks Of Treatment: 64 Clustered Wound: No Photos Photo Uploaded By: Rema Jasmine on 04/06/2018 11:31:57 Wound Measurements Length: (cm) 4.4 % Reduction Width: (cm) 1.5 % Reduction Depth: (cm) 0.7 Epitheliali Area: (cm) 5.184 Underminin Volume: (cm) 3.629 Startin Ending P Maximum in Area: -24.8% in Volume: -118.4% zation: None g: Yes g Position (o'clock): 6 osition (o'clock): 11 Distance: (cm) 1.5 Wound Description Classification: Category/Stage IV Foul Odor A Wound Margin: Flat and Intact Slough/Fibr Exudate Amount: Large Exudate Type: Serosanguineous Exudate Color: red, brown fter Cleansing: No ino Yes Wound Bed Granulation Amount: Large (67-100%) Exposed Structure Granulation Quality: Red, Hyper-granulation Fascia Exposed: No Necrotic Amount: Small (1-33%) Fat Layer (Subcutaneous Tissue) Exposed: Yes Necrotic Quality: Adherent Slough Tendon Exposed: No Muscle Exposed: Yes Necrosis of Muscle: No KIANNI, LHEUREUX. ([id number]) Joint Exposed: No Bone  Exposed: No Periwound Skin Texture Texture Color No Abnormalities Noted: No No Abnormalities Noted: No Callus: No Atrophie Blanche: No Crepitus: No Cyanosis: No Excoriation: No Ecchymosis: No Induration: Yes Erythema: Yes  Rash: Yes Erythema Location: Circumferential Scarring: No Hemosiderin Staining: No Mottled: No Moisture Pallor: No No Abnormalities Noted: No Rubor: No Dry / Scaly: No Maceration: Yes Temperature / Pain Temperature: No Abnormality Tenderness on Palpation: Yes Wound Preparation Ulcer Cleansing: Rinsed/Irrigated with Saline Topical Anesthetic Applied: Other: lidocaine 4%, Treatment Notes Wound #1 (Midline Coccyx) 1. Cleansed with: Clean wound with Normal Saline 2. Anesthetic Topical Lidocaine 4% cream to wound bed prior to debridement 4. Dressing Applied: Hydrogel Prisma Ag 5. Secondary Dressing Applied Bordered Foam Dressing Foam Notes foam with gauze and tape to elbow. on sacrum boarder foam Electronic Signature(s) Signed: 04/06/2018 4:03:40 PM By: Rema Jasmine Entered By: Rema Jasmine on 04/06/2018 11:05:18 [name] ([id number]) -------------------------------------------------------------------------------- Wound Assessment Details Patient Name: [name] Date of Service: 04/06/2018 10:15 AM Medical Record Number: [id number] Patient Account Number: [id number] Date of Birth/Sex: 28-Nov-1922 (84 y.o. F) Treating RN: Rema Jasmine Primary Care Sirenity Shew: Vonita Moss Other Clinician: Referring Malayshia All: Vonita Moss Treating Jernee Murtaugh/Extender: Linwood Dibbles, HOYT Weeks in Treatment: 64 Wound Status Wound Number: 10 Primary Etiology: Abrasion Wound Location: Right Lower Leg - Medial Wound Status: Open Wounding Event: Shear/Friction Comorbid History: Anemia, Hypertension, Dementia Date Acquired: 03/24/2018 Weeks Of Treatment: 0 Clustered Wound: No Photos Photo Uploaded By: Rema Jasmine on 04/06/2018 11:31:58 Wound Measurements Length:  (cm) 1.5 Width: (cm) 1.2 Depth: (cm) 0.1 Area: (cm) 1.414 Volume: (cm) 0.141 % Reduction in Area: 0% % Reduction in Volume: 0% Tunneling: No Undermining: No Wound Description Classification: Partial Thickness Foul Odor After Cleansing: No Slough/Fibrino No Periwound Skin Texture Texture Color No Abnormalities Noted: Yes No Abnormalities Noted: No Erythema: Yes Moisture Erythema Location: Circumferential No Abnormalities Noted: No Wound Preparation Ulcer Cleansing: Rinsed/Irrigated with Saline Topical Anesthetic Applied: Xylocaine 4% Topical Solution Electronic Signature(s) Signed: 04/06/2018 4:03:40 PM By: Beckie Busing ([id number]) Entered By: Rema Jasmine on 04/06/2018 11:24:32 [name] ([id number]) -------------------------------------------------------------------------------- Wound Assessment Details Patient Name: [name] Date of Service: 04/06/2018 10:15 AM Medical Record Number: [id number] Patient Account Number: [id number] Date of Birth/Sex: 1922-11-16 (84 y.o. F) Treating RN: Rema Jasmine Primary Care Terell Kincy: Vonita Moss Other Clinician: Referring Desha Bitner: Vonita Moss Treating Demyah Smyre/Extender: Linwood Dibbles, HOYT Weeks in Treatment: 64 Wound Status Wound Number: 9 Primary Etiology: Trauma, Other Wound Location: Left Elbow Wound Status: Open Wounding Event: Trauma Comorbid History: Anemia, Hypertension, Dementia Date Acquired: 02/16/2018 Weeks Of Treatment: 6 Clustered Wound: No Photos Photo Uploaded By: Rema Jasmine on 04/06/2018 11:32:30 Wound Measurements Length: (cm) 1.7 Width: (cm) 1.4 Depth: (cm) 0.1 Area: (cm) 1.869 Volume: (cm) 0.187 % Reduction in Area: -54.5% % Reduction in Volume: -54.5% Epithelialization: None Tunneling: No Undermining: No Wound Description Full Thickness Without Exposed Support Foul Odo Classification: Structures Slough/F Wound Margin: Distinct, outline  attached Exudate Small Amount: Exudate Type: Serous Exudate Color: amber r After Cleansing: No ibrino Yes Wound Bed Granulation Amount: Large (67-100%) Exposed Structure Granulation Quality: Red, Pink Fascia Exposed: No Necrotic Amount: Small (1-33%) Fat Layer (Subcutaneous Tissue) Exposed: No Tendon Exposed: No Muscle Exposed: No Joint Exposed: No Bone Exposed: No OONA, TRAMMEL. ([id number]) Periwound Skin Texture Texture Color No Abnormalities Noted: No No Abnormalities Noted: No Erythema: Yes Moisture Erythema Location: Circumferential No Abnormalities Noted: No Dry / Scaly: Yes Temperature / Pain Temperature: No Abnormality Tenderness on Palpation: Yes Wound Preparation Ulcer Cleansing: Rinsed/Irrigated with Saline Topical Anesthetic Applied: None, Other: lidocaine 4%, Treatment Notes Wound #9 (Left Elbow) 1. Cleansed with: Clean wound with Normal Saline 2. Anesthetic Topical Lidocaine 4%  cream to wound bed prior to debridement 4. Dressing Applied: Hydrogel Prisma Ag 5. Secondary Dressing Applied Bordered Foam Dressing Foam Notes foam with gauze and tape to elbow. on sacrum boarder foam Electronic Signature(s) Signed: 04/06/2018 4:03:40 PM By: Rema JasmineNg, Wendi Entered By: Rema JasmineNg, Wendi on 04/06/2018 11:13:48 Elmarie MainlandSHAW, Catina W. ([id number]) -------------------------------------------------------------------------------- Vitals Details Patient Name: Elmarie MainlandSHAW, Corbin W. Date of Service: 04/06/2018 10:15 AM Medical Record Number: [id number] Patient Account Number: [id number] Date of Birth/Sex: 1923-04-19 (84 y.o. F) Treating RN: Rema JasmineNg, Wendi Primary Care Adriel Desrosier: Vonita MossRISSMAN, MARK Other Clinician: Referring Stephaie Dardis: Vonita MossRISSMAN, MARK Treating Khiree Bukhari/Extender: Linwood DibblesSTONE III, HOYT Weeks in Treatment: 64 Vital Signs Time Taken: 10:45 Temperature (F): 97.7 Height (in): 60 Pulse (bpm): 61 Weight (lbs): 90 Respiratory Rate (breaths/min): 16 Body Mass Index (BMI): 17.6 Blood  Pressure (mmHg): 147/88 Reference Range: 80 - 120 mg / dl Electronic Signature(s) Signed: 04/06/2018 4:03:40 PM By: Rema JasmineNg, Wendi Entered ByRema Jasmine: Ng, Wendi on 04/06/2018 10:55:14

## 2018-04-07 NOTE — Addendum Note (Signed)
Addended by: Vonita MossRISSMAN, Aleida Crandell A on: 04/07/2018 12:05 PM   Modules accepted: Orders

## 2018-04-07 NOTE — Telephone Encounter (Signed)
Patient's daughter notified.

## 2018-04-08 NOTE — Progress Notes (Addendum)
[name], [name] ([id number]) Visit Report for 04/06/2018 Chief Complaint Document Details Patient Name: [name], [name] Date of Service: 04/06/2018 10:15 AM Medical Record Number: [id number] Patient Account Number: [id number] Date of Birth/Sex: 1923-03-03 (84 y.o. F) Treating RN: Phillis Haggis Primary Care Provider: Vonita Moss Other Clinician: Referring Provider: Vonita Moss Treating Provider/Extender: Linwood Dibbles, HOYT Weeks in Treatment: 78 Information Obtained from: Patient Chief Complaint patient is here for follow up evaluation of a sacral pressure ulcer Electronic Signature(s) Signed: 04/06/2018 11:53:57 PM By: Lenda Kelp PA-C Entered By: Lenda Kelp on 04/06/2018 10:15:37 [name] ([id number]) -------------------------------------------------------------------------------- HPI Details Patient Name: [name] Date of Service: 04/06/2018 10:15 AM Medical Record Number: [id number] Patient Account Number: [id number] Date of Birth/Sex: 02/27/1923 (84 y.o. F) Treating RN: Phillis Haggis Primary Care Provider: Vonita Moss Other Clinician: Referring Provider: Vonita Moss Treating Provider/Extender: Linwood Dibbles, HOYT Weeks in Treatment: 53 History of Present Illness HPI Description: 01/07/17 this is a 84 year old woman admitted to the clinic today for review of a pressure ulcer on her lower sacrum. She is referred from her primary physician's office after being seen on 3/22 with a 3 cm pressure area. Her daughter and caretaker accompanied her today state that the area first became obvious about a month ago and his since deteriorated. They have recently got [name] a home health involved and have been using [name] to the wound. They have ordered a pressure relief surface for her mattress. They are turning her religiously. They state that she eats well and they've been forcing fluids on her. She is on a multivitamin. Looking through [facility] point last  albumin I see was 4.4 on 10/28. The patient has advanced parkinsonism which looks superficially like advanced Parkinson's disease although her daughter tells me she did not ever respond to Sinemet therefore this may have another pathology with signs of parkinsonism. However I think this is largely a mute point currently. She also has dementia and is nonambulatory. Since this started they have been keeping her in bed and turning her religiously every 2 hours. She lives at home in [place] with her husband with 24/7 care giving 01/13/17 santyl change qd. Still will require further debridement. continue santyl. 01/20/17; patient's wound actually looks some better less adherent necrotic surface. There is actually visible granulation. We're using [name] 01/27/17; better-looking surface but still a lot of necrotic tissue on the base of this wound. The periwound erythema is better than last week we are still using [name]. Her daughter tells [place] that she is still having trouble with the pressure-relief mattress through medical modalities 02/03/18; I had the patient scheduled for a two week followup however her daughter brought her in early concerned for discoloration on 2 areas of the wound circumference. We have bee using santyl 02/10/17;Better looking surface to the wound. Rim appears better suggesting better offloading. Using santyl 02/24/17; change to Silver Collegen last time. Wound appears better. 03/10/17; still using silver collagen religious offloading. Intake is satisfactory per her daughter. Dimension slightly better 03/12/2017 -- Dr. Jannetta Quint patient who had been seen 2 days ago and was doing fairly well. The patient is brought in by her daughter who noticed a new wound just above the previous wound on her sacral area and going on more to the left lateral side. She was very concerned and we asked her to get in for an opinion. 03/17/17; above is noted. The patient has developed a progressive area to the  left of her original wound. This  seems to this started with a ring of red skin with a more pale interior almost looking fungal. There was a rim of blister through part of the area although this did not look like zoster. They have been applying triamcinolone that was prescribed last week by [name] and the area has a fold to a linear band area which is confluent, erythematous and with obvious epidermal swelling but there is no overt tenderness or crepitus. She has lost some surface epithelium closer to the wound surface itself and now has a more superficial wound in this area and the extending erythema goes towards the left buttock. This is well demarcated between involved in normal skin but once again does not appear to be at all tender. If there is a contact issue here I cannot get the history out of the daughter or the caregiver that are with her. 03/24/17; the patient arrives today with the wound slightly worse slightly more drainage. The bandlike area of erythema that I treated as a possible pineal infection has improved somewhat although proximally is still has confluent erythema without overt tenderness. We have been using silver alginate since the most recent deterioration. To the bandlike degree of erythema we have been using Lotrisone cream 03/31/17; patient arrives today with the wound slightly larger, necrotic surface and surrounding erythema. The bandlike area of erythema that I treated as a possible pineal infection is less swollen but still present I've been using Lotrisone cream on that largely related to the presence of a tinea looking infection when this was first seen. We've been using silver alginate. X-ray that I ordered last week showed no acute bony abnormalities mild fecal impaction. Lab work showed a comprehensive metabolic panel that was normal including an albumin of 3.8. White count was 9.5 hemoglobin 12.3 differential count normal. C-reactive protein was less than 1 and  sedimentation rate and 17. The latter 2 values does not support an ongoing bacterial infection. 04/14/17; patient arrives after a 2 week hiatus. Her wound is not in good condition. Although the base of the wound looks Tina MainlandSHAW, Laterica W. ([id number]) stable she still has an erythematous area that was apparently blistered over the weekend. This again points to the left. As our intake nurse pointed out today this is in the area where the tissue folds together and we may need to prevent try to prevent this. Lab work and x-ray that I did to 3 weeks ago were unremarkable including her albumin nevertheless she is an extremely frail condition physically. We have have been using [name] 04/22/17; I changed her to silver alginate because of the surrounding maceration and moisture last week. The daughter did not like the way the wound looked in the middle of the week and changed her back to [place]. They're putting gauze on top of this. Thinks still using Lotrisone. 05/06/17 on evaluation today patient sacral wound appears to be doing okay and does not seem to be any worse. She is having no significant pain during evaluation today the secondary to mental status she is unable to rate or describe whether she had any pain she was not however flinching. Her daughter states that the wound does appear to be looking better to her. Still we are having difficulty with the skinfold that seems to be closing in on itself at this point. All in all I feel like she is making some good progress in the [name] seems to be the official for her. They do tell me that a refill if  we are gonna continue that today. No fevers, chills, nausea, or vomiting noted at this time. 05/13/17 presents today for evaluation concerning her ongoing sacral pressure ulcer. Unfortunately she also has an area of deep tissue injury in the right Ischial region which is starting to show up. The sacral wound also continues to show signs of necrotic tissue  overlying and has declined. Overall we really have not seen a significant improvement in the past several months in regard to the sacral wound and now patient is starting to develop a new wound in the right Ischial region. Obviously this is not trending in the direction that we want to see. No fevers, chills, nausea, or vomiting noted at this time. 05/19/17; I have not seen this wound and almost a month however there is nothing really positive to say about it. Necrotic tissue over the surface which superiorly I think abuts on her sacrum. She has surrounding erythema. I would be surprised if there is not underlying osteomyelitis or soft tissue infection. 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 She apparently eats well per description although I wonder about this looking at her. Lab work I did probably 4 weeks ago however was really quite normal including a serum albumin 05/26/17; culture I did last week grew Escherichia coli and methicillin sensitive staph aureus which should've been well covered by the Augmentin and ciprofloxacin. Indeed the erythema around the wound in the bed of the wound looks somewhat better. Her intake is still satisfactory. They now have a near fluidized bed 06/02/17; they completed the antibiotics last Friday. Using collagen. Daughter still reports eating and drinking well. There is less visualized erythema around the wound. 06/16/17; large pressure ulcer over her lower sacrum and coccyx. Using [name] to the wound bed. 06/30/17; certainly no change in dimensions of this large stage III wound over her sacrum and coccyx. They've been using [name]. There is no exposed bone. She has a candidal/tinea area in the right inguinal area. Other than that her daughter relates that she is eating and drinking well there changing her positioning to make sure the areas offloaded 07/14/17; no major change in the dimensions of this large stage III wound. Initially a smaller wound that  became secondarily infected causing significant tissue breakdown although it is been stable in the last several weeks. Tunneling superiorly at roughly 1:00 no change here either. There is no bone palpable. Both the patient's daughter and caretaker states that she eats well. I have not rechecked her blood work 07/27/17; patient arrives in clinic today and generally a deteriorated looking state. Mild fever with axillary temperature of 100.4. Daughter reports she has not been eating and drinking well since yesterday. She looks more pale and thin and less responsive. We have been using silver collagen to her wound 08/11/17; since the last time the patient was here things have gone better. Her fever went down and she started eating and drinking again. Culture I did of the wound showed methicillin sensitive staph aureus, Morganella and enterococcus. I only treated her with Keflex which would've not covered the Morganella and enterococcus however the purulent area on the 2:00 side of her wound is a lot better and the bandlike erythema that concern me also was resolved. I'd called the daughter last week to confirm that she was a lot better. She finished the Keflex last Thursday she also suffered a skin tear this morning perhaps while putting on her incontinence brief period is on the lateral aspect of  her right leg. Clean wound with the surface epithelium not viable. 08/25/17; last week the patient was noted to have erythema around the wound margin and a slight fever which the patient's daughter says was 50. Our office was contacted by home health however we did not have a space to work the patient in that she went to see her primary physician Dr. Maurice March. She was not febrile during this visit on 08/21/17 there was erythema around the wound similar to last occasion. Dr. Maurice March in reference to my culture from 07/28/17 and put her on doxycycline capsules which they're opening twice a day for 10 days. 09/17/17;  patient arrives today with the wound bed looking fairly well granulated. There is undermining from 4 to 6:00 although this seems to of contracted slightly. She does not have obvious infection although the daughter states there was some darkening of the wound circumference that is not evident today. They state she is eating well. They are concerned about oral thrush 09/30/16; very fibrin looking granulated wound bed. Her undermining from 4 to 6:00 is about the same but also appears to be well granulated. She has rolled edges of senescent tissue from roughly 7 to 12:00. There is no evidence of infection [name], [name] ([id number]) 10/13/17 on evaluation today patient appears to be doing fairly well all things considered in regard to her sacral wound. There's really not a lot of significant change or improvement she does have some evidence of contusion and deep tissue injury around the left border of the wound that patient's daughter did inquire about today. Nonetheless overall the wound appears to be doing about the same in my opinion. There is no significant indication of infection there also is no significant slough noted at this point. 10/27/17 She is here in follow up evaluation of a sacral ulcer. She is accompanied by her daughter and caregiver. There is red granulation tissue throughout, persistent discoloration to left border; this appears consistent with deep tissue injury. There are multiple areas covered in foam borders, tegaderm, etc that are "preventative" with "no wounds". We will continue with prisma and continue with two week follow ups 11/17/17 on evaluation today patient appears to be doing decently well in regard to her wound at this point. She continues to have a sacral wound ulcer. We see her roughly every two weeks. In the last week she was actually in the hospital due to what was diagnosed as sepsis although no organisms were ever identified in the actual blood cultures. The  hospital really was not sure that the wound was the cause of the infection but they really did not figure out anything else that would be a causative organism she did have a CT scan and that revealed no evidence of pneumonia there was also no evidence of urinary tract infection. Again it very well could have been the wound called in those although wound appears to be doing fairly well at this point. 12/15/17 on evaluation today patient actually appears to be doing about the same in regard to her sacral ulcer. The one thing different is that she does seem to have a rash where her right arm is contracted and being held to the thorax. The area underlying both on the ventral side of the arm as well is the thorax where it comes in contact shows evidence of a rash which appears to be fungal in nature. There does not appear to be any evidence of infection lies at this point. There does not  appear to be a rash consistent with shingles which was also of concern initially. No fevers, chills, nausea, or vomiting noted at this time. 12/29/17 on evaluation today patient appears to be doing a little better in regard to her sacral wound although the one changes the 12 o'clock location of the wound seems to have attached at one point which no longer allows this to pull back. This has caused an area of undermining that seems to be attaching as well in the 12 o'clock location. I do think that this is something that can be managed and is not necessarily a bad thing. Nonetheless she does seem to have some pain with exploration of this region of undermining. She continues to have an area under her right arm on the chest wall of erythema although this is a little better especially after the home health nurse is actually got in order for Diflucan times one for the patient. She is a new area on the left wrist that looks like because of the contraction her nail may have pushed in on her wrist area causing a slight cut which  subsequently became infected. She has some honey crusted drainage noted. 03/09/18 undervaluation today patient appears to be doing fairly well in regard to her wounds in general. She has been tolerating the dressing changes without complication. Overall I'm pleased with the progress that seems to be made week by week especially in regard to the sacral ulcer. Her daughter and the caregiver seem to take very good care of her. With that being said she has very little undermining in regard to the sacral wound in good granulation she also has good epithelialization noted. 03/23/18 on evaluation today patient appears to be doing rather well in regard to her sacral wound. Unfortunately she does have a little bit of necrotic tissue noted in the central portion of her wound on the left elbow. This is due to the fact that she is lying on this arm seeing how it is contracted. Currently her daughter has started to avoid lying on the side it all due to the fact that the necrotic tissue was noted. With that being said they have been taking very good care of her in my pinion. Fortunately there does not appear to be any evidence of infection which is good news. Overall I'm pleased with the progress she's made other than in regard to the elbow. 04/06/18 on evaluation today patient actually appears to be doing okay in regard to the sacral wound. In fact it appears to be somewhat smaller in general although there does appear to be more undermining at the 6 o'clock location than was previously noted. She has been tolerating the dressing changes without complication in general which is also good news. Nonetheless she does have a new small skin tear/opening on her leg which was evaluated today. Fortunately this appears to be minimal and the Xeroform gauze which has been used up to this point in the past has been utilized very effectively seems to be doing well in that regard. Her left elbow ulcer seems to also be doing  excellent at this point making good progress. Electronic Signature(s) Signed: 04/06/2018 11:53:57 PM By: Riley Lam, [place] ([id number]) Entered By: Lenda Kelp on 04/06/2018 21:07:52 [name] ([id number]) -------------------------------------------------------------------------------- Physical Exam Details Patient Name: [name] Date of Service: 04/06/2018 10:15 AM Medical Record Number: [id number] Patient Account Number: [id number] Date of Birth/Sex: Feb 16, 1923 (84 y.o. F) Treating RN: Phillis Haggis  Primary Care Provider: Vonita Moss Other Clinician: Referring Provider: Vonita Moss Treating Provider/Extender: STONE III, HOYT Weeks in Treatment: 31 Constitutional Well-nourished and well-hydrated in no acute distress. Respiratory normal breathing without difficulty. clear to auscultation bilaterally. Cardiovascular regular rate and rhythm with normal S1, S2. Psychiatric this patient is able to make decisions and demonstrates good insight into disease process. Alert and Oriented x 3. pleasant and cooperative. Notes At this time the patient's wounds actually show signs of good improvement in general except for the sacral wound at the 6 o'clock location where there's more undermining. Apparently she's been on her right side more her daughter seems to feel this may be the reason this seems to be more significant today compared to previous. Nonetheless there appears to potentially be a little bit of abnormal discharge I'm actually going to see about performing a culture to evaluate and see if there's anything different than needs to be done as far as a possible antibiotic is concerned there's not a lot of erythema surrounding the wound bed and I'm not sure that antibiotics are gonna be necessary but if they do seem to be I'll be more than happy to send a scan when I get the results hopefully Friday. Electronic Signature(s) Signed: 04/06/2018  11:53:57 PM By: Lenda Kelp PA-C Entered By: Lenda Kelp on 04/06/2018 21:08:52 [name] ([id number]) -------------------------------------------------------------------------------- Physician Orders Details Patient Name: [name] Date of Service: 04/06/2018 10:15 AM Medical Record Number: [id number] Patient Account Number: [id number] Date of Birth/Sex: 18-Feb-1923 (84 y.o. F) Treating RN: Phillis Haggis Primary Care Provider: Vonita Moss Other Clinician: Referring Provider: Vonita Moss Treating Provider/Extender: Linwood Dibbles, HOYT Weeks in Treatment: 10 Verbal / Phone Orders: Yes Clinician: Ashok Cordia, Debi Read Back and Verified: Yes Diagnosis Coding ICD-10 Coding Code Description L89.153 Pressure ulcer of sacral region, stage 3 L89.023 Pressure ulcer of left elbow, stage 3 G20 Parkinson's disease L03.312 Cellulitis of back [any part except buttock] S81.811D Laceration without foreign body, right lower leg, subsequent encounter Wound Cleansing Wound #1 Midline Coccyx o Clean wound with Normal Saline. o May Shower, gently pat wound dry prior to applying new dressing. Wound #10 Right,Medial Lower Leg o Clean wound with Normal Saline. o May Shower, gently pat wound dry prior to applying new dressing. Wound #9 Left Elbow o Clean wound with Normal Saline. o May Shower, gently pat wound dry prior to applying new dressing. Anesthetic (add to Medication List) Wound #1 Midline Coccyx o Topical Lidocaine 4% cream applied to wound bed prior to debridement (In Clinic Only). Wound #10 Right,Medial Lower Leg o Topical Lidocaine 4% cream applied to wound bed prior to debridement (In Clinic Only). Wound #9 Left Elbow o Topical Lidocaine 4% cream applied to wound bed prior to debridement (In Clinic Only). Skin Barriers/Peri-Wound Care Wound #1 Midline Coccyx o Skin Prep - use only where the allevyn sticks not on reddened areas o Antifungal  powder-Nystatin - around peri-wound on coccyx (do not get in wound), under right breast and right arm, right abdomen Primary Wound Dressing Wound #1 Midline Coccyx o Silver Collagen - prisma ag and pack into undermining as well slightly moisten with hydrogel or [address] LOREDA, SILVERIO. ([id number]) Wound #10 Right,Medial Lower Leg o Xeroform Wound #9 Left Elbow o Silver Collagen - prisma ag and pack into undermining as well slightly moisten with hydrogel or [place] Jelly Secondary Dressing Wound #1 Midline Coccyx o Boardered Foam Dressing - HHRN PLEASE ORDER ALLEVYN LIFE BORDERED FOAM FOR PATIENT (  OFF BRANDS IRRITATE HER SKIN) o Drawtex - HHRN to provide this for patient Wound #10 Right,Medial Lower Leg o Conform/Kerlix o Non-adherent pad Wound #9 Left Elbow o Dry Gauze - secure with tape o Foam Dressing Change Frequency Wound #1 Midline Coccyx o Change dressing every day. Wound #10 Right,Medial Lower Leg o Change dressing every day. Wound #9 Left Elbow o Change dressing every day. Follow-up Appointments o Return Appointment in 2 weeks. Off-Loading Wound #1 Midline Coccyx o Roho cushion for wheelchair o Mattress - fluidized air mattress o Turn and reposition every 2 hours Additional Orders / Instructions Wound #1 Midline Coccyx o Increase protein intake. - please add protein supplements to patients diet o Other: - please add vitamin A, vitamin C and zinc supplements to patients diet Wound #10 Right,Medial Lower Leg o Other: - HHRN - Right hip foam and [place] change every other day Home Health Wound #1 Midline Coccyx o Continue Home Health Visits - [facility] o Home Health Nurse may visit PRN to address patientos wound care needs. o FACE TO FACE ENCOUNTER: MEDICARE and MEDICAID PATIENTS: I certify that this patient is under my care and that I had a face-to-face encounter that meets the physician face-to-face encounter  requirements with this [name], [name] ([id number]) patient on this date. The encounter with the patient was in whole or in part for the following MEDICAL CONDITION: (primary reason for Home Healthcare) MEDICAL NECESSITY: I certify, that based on my findings, NURSING services are a medically necessary home health service. HOME BOUND STATUS: I certify that my clinical findings support that this patient is homebound (i.e., Due to illness or injury, pt requires aid of supportive devices such as crutches, cane, wheelchairs, walkers, the use of special transportation or the assistance of another person to leave their place of residence. There is a normal inability to leave the home and doing so requires considerable and taxing effort. Other absences are for medical reasons / religious services and are infrequent or of short duration when for other reasons). o If current dressing causes regression in wound condition, may D/C ordered dressing product/s and apply Normal Saline Moist Dressing daily until next Wound Healing Center / Other MD appointment. Notify Wound Healing Center of regression in wound condition at [phone]. o Please direct any NON-WOUND related issues/requests for orders to patient's Primary Care Physician - Dr Vonita Moss Wound #10 Right,Medial Lower Leg o Continue Home Health Visits - [facility] o Home Health Nurse may visit PRN to address patientos wound care needs. o FACE TO FACE ENCOUNTER: MEDICARE and MEDICAID PATIENTS: I certify that this patient is under my care and that I had a face-to-face encounter that meets the physician face-to-face encounter requirements with this patient on this date. The encounter with the patient was in whole or in part for the following MEDICAL CONDITION: (primary reason for Home Healthcare) MEDICAL NECESSITY: I certify, that based on my findings, NURSING services are a medically necessary home health service. HOME BOUND STATUS: I  certify that my clinical findings support that this patient is homebound (i.e., Due to illness or injury, pt requires aid of supportive devices such as crutches, cane, wheelchairs, walkers, the use of special transportation or the assistance of another person to leave their place of residence. There is a normal inability to leave the home and doing so requires considerable and taxing effort. Other absences are for medical reasons / religious services and are infrequent or of short duration when for other reasons). o If  current dressing causes regression in wound condition, may D/C ordered dressing product/s and apply Normal Saline Moist Dressing daily until next Wound Healing Center / Other MD appointment. Notify Wound Healing Center of regression in wound condition at [phone]. o Please direct any NON-WOUND related issues/requests for orders to patient's Primary Care Physician - Dr Vonita Moss Wound #9 Left Elbow o Continue Home Health Visits - [facility] o Home Health Nurse may visit PRN to address patientos wound care needs. o FACE TO FACE ENCOUNTER: MEDICARE and MEDICAID PATIENTS: I certify that this patient is under my care and that I had a face-to-face encounter that meets the physician face-to-face encounter requirements with this patient on this date. The encounter with the patient was in whole or in part for the following MEDICAL CONDITION: (primary reason for Home Healthcare) MEDICAL NECESSITY: I certify, that based on my findings, NURSING services are a medically necessary home health service. HOME BOUND STATUS: I certify that my clinical findings support that this patient is homebound (i.e., Due to illness or injury, pt requires aid of supportive devices such as crutches, cane, wheelchairs, walkers, the use of special transportation or the assistance of another person to leave their place of residence. There is a normal inability to leave the home and doing so requires  considerable and taxing effort. Other absences are for medical reasons / religious services and are infrequent or of short duration when for other reasons). o If current dressing causes regression in wound condition, may D/C ordered dressing product/s and apply Normal Saline Moist Dressing daily until next Wound Healing Center / Other MD appointment. Notify Wound Healing Center of regression in wound condition at [phone]. o Please direct any NON-WOUND related issues/requests for orders to patient's Primary Care Physician - Dr Vonita Moss Laboratory o Bacteria identified in Wound by Culture (MICRO) - coccyx oooo LOINC Code: 6462-6 oooo Convenience Name: Wound culture routine [name], [name] ([id number]) Patient Medications Allergies: Phenergan, Sulfa (Sulfonamide Antibiotics) Notifications Medication Indication Start End lidocaine DOSE 1 - topical 4 % cream - 1 cream topical Augmentin 04/09/2018 DOSE 1 - oral 875 mg-125 mg tablet - 1 tablet oral taken 2 times a day for 10 days Electronic Signature(s) Signed: 04/09/2018 10:19:44 AM By: Lenda Kelp PA-C Previous Signature: 04/06/2018 5:13:20 PM Version By: Alejandro Mulling Previous Signature: 04/06/2018 11:53:57 PM Version By: Lenda Kelp PA-C Entered By: Lenda Kelp on 04/09/2018 10:19:42 [name] ([id number]) -------------------------------------------------------------------------------- Prescription 04/06/2018 Patient Name: [name] Provider: Lenda Kelp PA-C Date of Birth: 02-May-1923 NPI#: [id number] Sex: F DEA#: [id number] Phone #: [phone] License #: Patient Address: [facility] [address] [facility] [place], [place] [address] [address] [place], [place] [address] [phone] Allergies Phenergan Sulfa (Sulfonamide Antibiotics) Medication Medication: Route: Strength: Form: lidocaine 4 % topical cream  topical 4% cream Class: TOPICAL LOCAL ANESTHETICS Dose: Frequency / Time: Indication: 1 1 cream topical Number of Refills: Number of Units: 0 Generic Substitution: Start Date: End Date: One Time Use: Substitution Permitted No Note to Pharmacy: Signature(s): Date(s): Electronic Signature(s) Signed: 04/11/2018 11:23:32 PM By: Lenda Kelp PA-C Previous Signature: 04/06/2018 5:13:20 PM Version By: Alejandro Mulling Previous Signature: 04/06/2018 11:53:57 PM Version By: Lenda Kelp PA-C Entered By: Lenda Kelp on 04/09/2018 10:19:52 Reddy, Myles Gip ([id number]) [name] ([id number]) --------------------------------------------------------------------------------  Problem List Details Patient Name: [name] Date of Service: 04/06/2018 10:15 AM Medical Record Number: [id number] Patient Account  Number: [id number] Date of Birth/Sex: 09-Apr-1923 (84 y.o. F) Treating RN: Phillis Haggis Primary Care Provider: Vonita Moss Other Clinician: Referring Provider: Vonita Moss Treating Provider/Extender: Linwood Dibbles, HOYT Weeks in Treatment: 88 Active Problems ICD-10 Evaluated Encounter Code Description Active Date Today Diagnosis L89.153 Pressure ulcer of sacral region, stage 3 01/07/2017 No Yes L89.023 Pressure ulcer of left elbow, stage 3 03/23/2018 No Yes G20 Parkinson's disease 01/07/2017 No Yes L03.312 Cellulitis of back [any part except buttock] 07/28/2017 No Yes S81.811D Laceration without foreign body, right lower leg, subsequent 08/11/2017 No Yes encounter Inactive Problems Resolved Problems Electronic Signature(s) Signed: 04/06/2018 11:53:57 PM By: Lenda Kelp PA-C Entered By: Lenda Kelp on 04/06/2018 10:15:24 [name] ([id number]) -------------------------------------------------------------------------------- Progress Note Details Patient Name: [name] Date of Service: 04/06/2018 10:15 AM Medical Record Number:  [id number] Patient Account Number: [id number] Date of Birth/Sex: 1923-07-12 (84 y.o. F) Treating RN: Phillis Haggis Primary Care Provider: Vonita Moss Other Clinician: Referring Provider: Vonita Moss Treating Provider/Extender: Linwood Dibbles, HOYT Weeks in Treatment: 19 Subjective Chief Complaint Information obtained from Patient patient is here for follow up evaluation of a sacral pressure ulcer History of Present Illness (HPI) 01/07/17 this is a 84 year old woman admitted to the clinic today for review of a pressure ulcer on her lower sacrum. She is referred from her primary physician's office after being seen on 3/22 with a 3 cm pressure area. Her daughter and caretaker accompanied her today state that the area first became obvious about a month ago and his since deteriorated. They have recently got [name] home health involved and have been using [name] to the wound. They have ordered a pressure relief surface for her mattress. They are turning her religiously. They state that she eats well and they've been forcing fluids on her. She is on a multivitamin. Looking through [facility] point last albumin I see was 4.4 on 10/28. The patient has advanced parkinsonism which looks superficially like advanced Parkinson's disease although her daughter tells me she did not ever respond to Sinemet therefore this may have another pathology with signs of parkinsonism. However I think this is largely a mute point currently. She also has dementia and is nonambulatory. Since this started they have been keeping her in bed and turning her religiously every 2 hours. She lives at home in [place] with her husband with 24/7 care giving 01/13/17 santyl change qd. Still will require further debridement. continue santyl. 01/20/17; patient's wound actually looks some better less adherent necrotic surface. There is actually visible granulation. We're using [name] 01/27/17; better-looking surface but still a lot  of necrotic tissue on the base of this wound. The periwound erythema is better than last week we are still using [name]. Her daughter tells [place] that she is still having trouble with the pressure-relief mattress through medical modalities 02/03/18; I had the patient scheduled for a two week followup however her daughter brought her in early concerned for discoloration on 2 areas of the wound circumference. We have bee using santyl 02/10/17;Better looking surface to the wound. Rim appears better suggesting better offloading. Using santyl 02/24/17; change to Silver Collegen last time. Wound appears better. 03/10/17; still using silver collagen religious offloading. Intake is satisfactory per her daughter. Dimension slightly better 03/12/2017 -- Dr. Jannetta Quint patient who had been seen 2 days ago and was doing fairly well. The patient is brought in by her daughter who noticed a new wound just above the previous wound on her sacral area and going on  more to the left lateral side. She was very concerned and we asked her to get in for an opinion. 03/17/17; above is noted. The patient has developed a progressive area to the left of her original wound. This seems to this started with a ring of red skin with a more pale interior almost looking fungal. There was a rim of blister through part of the area although this did not look like zoster. They have been applying triamcinolone that was prescribed last week by Dr. Meyer Russel and the area has a fold to a linear band area which is confluent, erythematous and with obvious epidermal swelling but there is no overt tenderness or crepitus. She has lost some surface epithelium closer to the wound surface itself and now has a more superficial wound in this area and the extending erythema goes towards the left buttock. This is well demarcated between involved in normal skin but once again does not appear to be at all tender. If there is a contact issue here I cannot get the  history out of the daughter or the caregiver that are with her. 03/24/17; the patient arrives today with the wound slightly worse slightly more drainage. The bandlike area of erythema that I treated as a possible pineal infection has improved somewhat although proximally is still has confluent erythema without overt tenderness. We have been using silver alginate since the most recent deterioration. To the bandlike degree of erythema we have been using Lotrisone cream 03/31/17; patient arrives today with the wound slightly larger, necrotic surface and surrounding erythema. The bandlike area of erythema that I treated as a possible pineal infection is less swollen but still present I've been using Lotrisone cream on that largely related to the presence of a tinea looking infection when this was first seen. We've been using silver alginate. [name], [name] ([id number]) X-ray that I ordered last week showed no acute bony abnormalities mild fecal impaction. Lab work showed a comprehensive metabolic panel that was normal including an albumin of 3.8. White count was 9.5 hemoglobin 12.3 differential count normal. C-reactive protein was less than 1 and sedimentation rate and 17. The latter 2 values does not support an ongoing bacterial infection. 04/14/17; patient arrives after a 2 week hiatus. Her wound is not in good condition. Although the base of the wound looks stable she still has an erythematous area that was apparently blistered over the weekend. This again points to the left. As our intake nurse pointed out today this is in the area where the tissue folds together and we may need to prevent try to prevent this. Lab work and x-ray that I did to 3 weeks ago were unremarkable including her albumin nevertheless she is an extremely frail condition physically. We have have been using [name] 04/22/17; I changed her to silver alginate because of the surrounding maceration and moisture last week. The daughter  did not like the way the wound looked in the middle of the week and changed her back to Thomson. They're putting gauze on top of this. Thinks still using Lotrisone. 05/06/17 on evaluation today patient sacral wound appears to be doing okay and does not seem to be any worse. She is having no significant pain during evaluation today the secondary to mental status she is unable to rate or describe whether she had any pain she was not however flinching. Her daughter states that the wound does appear to be looking better to her. Still we are having difficulty with the skinfold that  seems to be closing in on itself at this point. All in all I feel like she is making some good progress in the [name] seems to be the official for her. They do tell me that a refill if we are gonna continue that today. No fevers, chills, nausea, or vomiting noted at this time. 05/13/17 presents today for evaluation concerning her ongoing sacral pressure ulcer. Unfortunately she also has an area of deep tissue injury in the right Ischial region which is starting to show up. The sacral wound also continues to show signs of necrotic tissue overlying and has declined. Overall we really have not seen a significant improvement in the past several months in regard to the sacral wound and now patient is starting to develop a new wound in the right Ischial region. Obviously this is not trending in the direction that we want to see. No fevers, chills, nausea, or vomiting noted at this time. 05/19/17; I have not seen this wound and almost a month however there is nothing really positive to say about it. Necrotic tissue over the surface which superiorly I think abuts on her sacrum. She has surrounding erythema. I would be surprised if there is not underlying osteomyelitis or soft tissue infection. 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 She apparently eats well per description although I wonder about this looking at her. Lab work I  did probably 4 weeks ago however was really quite normal including a serum albumin 05/26/17; culture I did last week grew Escherichia coli and methicillin sensitive staph aureus which should've been well covered by the Augmentin and ciprofloxacin. Indeed the erythema around the wound in the bed of the wound looks somewhat better. Her intake is still satisfactory. They now have a near fluidized bed 06/02/17; they completed the antibiotics last Friday. Using collagen. Daughter still reports eating and drinking well. There is less visualized erythema around the wound. 06/16/17; large pressure ulcer over her lower sacrum and coccyx. Using [name] to the wound bed. 06/30/17; certainly no change in dimensions of this large stage III wound over her sacrum and coccyx. They've been using [name]. There is no exposed bone. She has a candidal/tinea area in the right inguinal area. Other than that her daughter relates that she is eating and drinking well there changing her positioning to make sure the areas offloaded 07/14/17; no major change in the dimensions of this large stage III wound. Initially a smaller wound that became secondarily infected causing significant tissue breakdown although it is been stable in the last several weeks. Tunneling superiorly at roughly 1:00 no change here either. There is no bone palpable. Both the patient's daughter and caretaker states that she eats well. I have not rechecked her blood work 07/27/17; patient arrives in clinic today and generally a deteriorated looking state. Mild fever with axillary temperature of 100.4. Daughter reports she has not been eating and drinking well since yesterday. She looks more pale and thin and less responsive. We have been using silver collagen to her wound 08/11/17; since the last time the patient was here things have gone better. Her fever went down and she started eating and drinking again. Culture I did of the wound showed methicillin  sensitive staph aureus, Morganella and enterococcus. I only treated her with Keflex which would've not covered the Morganella and enterococcus however the purulent area on the 2:00 side of her wound is a lot better and the bandlike erythema that concern me also was resolved. I'd called  the daughter last week to confirm that she was a lot better. She finished the Keflex last Thursday she also suffered a skin tear this morning perhaps while putting on her incontinence brief period is on the lateral aspect of her right leg. Clean wound with the surface epithelium not viable. 08/25/17; last week the patient was noted to have erythema around the wound margin and a slight fever which the patient's daughter says was 4. Our office was contacted by home health however we did not have a space to work the patient in that she went to see her primary physician Dr. Maurice March. She was not febrile during this visit on 08/21/17 there was erythema around the wound similar to last occasion. Dr. Maurice March in reference to my culture from 07/28/17 and put her on doxycycline capsules which they're opening twice a day for 10 days. [name], [name] ([id number]) 09/17/17; patient arrives today with the wound bed looking fairly well granulated. There is undermining from 4 to 6:00 although this seems to of contracted slightly. She does not have obvious infection although the daughter states there was some darkening of the wound circumference that is not evident today. They state she is eating well. They are concerned about oral thrush 09/30/16; very fibrin looking granulated wound bed. Her undermining from 4 to 6:00 is about the same but also appears to be well granulated. She has rolled edges of senescent tissue from roughly 7 to 12:00. There is no evidence of infection 10/13/17 on evaluation today patient appears to be doing fairly well all things considered in regard to her sacral wound. There's really not a lot of significant change  or improvement she does have some evidence of contusion and deep tissue injury around the left border of the wound that patient's daughter did inquire about today. Nonetheless overall the wound appears to be doing about the same in my opinion. There is no significant indication of infection there also is no significant slough noted at this point. 10/27/17 She is here in follow up evaluation of a sacral ulcer. She is accompanied by her daughter and caregiver. There is red granulation tissue throughout, persistent discoloration to left border; this appears consistent with deep tissue injury. There are multiple areas covered in foam borders, tegaderm, etc that are "preventative" with "no wounds". We will continue with prisma and continue with two week follow ups 11/17/17 on evaluation today patient appears to be doing decently well in regard to her wound at this point. She continues to have a sacral wound ulcer. We see her roughly every two weeks. In the last week she was actually in the hospital due to what was diagnosed as sepsis although no organisms were ever identified in the actual blood cultures. The hospital really was not sure that the wound was the cause of the infection but they really did not figure out anything else that would be a causative organism she did have a CT scan and that revealed no evidence of pneumonia there was also no evidence of urinary tract infection. Again it very well could have been the wound called in those although wound appears to be doing fairly well at this point. 12/15/17 on evaluation today patient actually appears to be doing about the same in regard to her sacral ulcer. The one thing different is that she does seem to have a rash where her right arm is contracted and being held to the thorax. The area underlying both on the ventral side of the arm  as well is the thorax where it comes in contact shows evidence of a rash which appears to be fungal in nature.  There does not appear to be any evidence of infection lies at this point. There does not appear to be a rash consistent with shingles which was also of concern initially. No fevers, chills, nausea, or vomiting noted at this time. 12/29/17 on evaluation today patient appears to be doing a little better in regard to her sacral wound although the one changes the 12 o'clock location of the wound seems to have attached at one point which no longer allows this to pull back. This has caused an area of undermining that seems to be attaching as well in the 12 o'clock location. I do think that this is something that can be managed and is not necessarily a bad thing. Nonetheless she does seem to have some pain with exploration of this region of undermining. She continues to have an area under her right arm on the chest wall of erythema although this is a little better especially after the home health nurse is actually got in order for Diflucan times one for the patient. She is a new area on the left wrist that looks like because of the contraction her nail may have pushed in on her wrist area causing a slight cut which subsequently became infected. She has some honey crusted drainage noted. 03/09/18 undervaluation today patient appears to be doing fairly well in regard to her wounds in general. She has been tolerating the dressing changes without complication. Overall I'm pleased with the progress that seems to be made week by week especially in regard to the sacral ulcer. Her daughter and the caregiver seem to take very good care of her. With that being said she has very little undermining in regard to the sacral wound in good granulation she also has good epithelialization noted. 03/23/18 on evaluation today patient appears to be doing rather well in regard to her sacral wound. Unfortunately she does have a little bit of necrotic tissue noted in the central portion of her wound on the left elbow. This is due to  the fact that she is lying on this arm seeing how it is contracted. Currently her daughter has started to avoid lying on the side it all due to the fact that the necrotic tissue was noted. With that being said they have been taking very good care of her in my pinion. Fortunately there does not appear to be any evidence of infection which is good news. Overall I'm pleased with the progress she's made other than in regard to the elbow. 04/06/18 on evaluation today patient actually appears to be doing okay in regard to the sacral wound. In fact it appears to be somewhat smaller in general although there does appear to be more undermining at the 6 o'clock location than was previously noted. She has been tolerating the dressing changes without complication in general which is also good news. Nonetheless she does have a new small skin tear/opening on her leg which was evaluated today. Fortunately this appears to be minimal and the Xeroform gauze which has been used up to this point in the past has been utilized very effectively seems [name], [name]. ([id number]) to be doing well in that regard. Her left elbow ulcer seems to also be doing excellent at this point making good progress. Patient History Unable to Obtain Patient History due to Altered Mental Status. Information obtained from Patient. Social History  Never smoker, Marital Status - Married, Alcohol Use - Never, Drug Use - No History, Caffeine Use - Never. Medical And Surgical History Notes Ear/Nose/Mouth/Throat dysphagia Neurologic parkinsons, tremor Review of Systems (ROS) Constitutional Symptoms (General Health) Denies complaints or symptoms of Fever, Chills. Respiratory The patient has no complaints or symptoms. Cardiovascular The patient has no complaints or symptoms. Psychiatric The patient has no complaints or symptoms. Objective Constitutional Well-nourished and well-hydrated in no acute distress. Vitals Time Taken: 10:45  AM, Height: 60 in, Weight: 90 lbs, BMI: 17.6, Temperature: 97.7 F, Pulse: 61 bpm, Respiratory Rate: 16 breaths/min, Blood Pressure: 147/88 mmHg. Respiratory normal breathing without difficulty. clear to auscultation bilaterally. Cardiovascular regular rate and rhythm with normal S1, S2. Psychiatric this patient is able to make decisions and demonstrates good insight into disease process. Alert and Oriented x 3. pleasant and cooperative. General Notes: At this time the patient's wounds actually show signs of good improvement in general except for the sacral wound at the 6 o'clock location where there's more undermining. Apparently she's been on her right side more her daughter seems to feel this may be the reason this seems to be more significant today compared to previous. Nonetheless there appears to potentially be a little bit of abnormal discharge I'm actually going to see about performing a culture to evaluate and see if there's anything different than needs to be done as far as a possible antibiotic is concerned there's not a lot of erythema Tina MainlandSHAW, Kessie W. ([id number]) surrounding the wound bed and I'm not sure that antibiotics are gonna be necessary but if they do seem to be I'll be more than happy to send a scan when I get the results hopefully Friday. Integumentary (Hair, Skin) Wound #1 status is Open. Original cause of wound was Pressure Injury. The wound is located on the Midline Coccyx. The wound measures 4.4cm length x 1.5cm width x 0.7cm depth; 5.184cm^2 area and 3.629cm^3 volume. There is muscle and Fat Layer (Subcutaneous Tissue) Exposed exposed. There is undermining starting at 6:00 and ending at 11:00 with a maximum distance of 1.5cm. There is a large amount of serosanguineous drainage noted. The wound margin is flat and intact. There is large (67-100%) red, hyper - granulation within the wound bed. There is a small (1-33%) amount of necrotic tissue within the wound bed  including Adherent Slough. The periwound skin appearance exhibited: Induration, Rash, Maceration, Erythema. The periwound skin appearance did not exhibit: Callus, Crepitus, Excoriation, Scarring, Dry/Scaly, Atrophie Blanche, Cyanosis, Ecchymosis, Hemosiderin Staining, Mottled, Pallor, Rubor. The surrounding wound skin color is noted with erythema which is circumferential. Periwound temperature was noted as No Abnormality. The periwound has tenderness on palpation. Wound #10 status is Open. Original cause of wound was Shear/Friction. The wound is located on the Right,Medial Lower Leg. The wound measures 1.5cm length x 1.2cm width x 0.1cm depth; 1.414cm^2 area and 0.141cm^3 volume. There is no tunneling or undermining noted. The periwound skin appearance had no abnormalities noted for texture. The periwound skin appearance exhibited: Erythema. The surrounding wound skin color is noted with erythema which is circumferential. Wound #9 status is Open. Original cause of wound was Trauma. The wound is located on the Left Elbow. The wound measures 1.7cm length x 1.4cm width x 0.1cm depth; 1.869cm^2 area and 0.187cm^3 volume. There is no tunneling or undermining noted. There is a small amount of serous drainage noted. The wound margin is distinct with the outline attached to the wound base. There is large (67-100%) red, pink granulation  within the wound bed. There is a small (1-33%) amount of necrotic tissue within the wound bed. The periwound skin appearance exhibited: Dry/Scaly, Erythema. The surrounding wound skin color is noted with erythema which is circumferential. Periwound temperature was noted as No Abnormality. The periwound has tenderness on palpation. Assessment Active Problems ICD-10 Pressure ulcer of sacral region, stage 3 Pressure ulcer of left elbow, stage 3 Parkinson's disease Cellulitis of back [any part except buttock] Laceration without foreign body, right lower leg, subsequent  encounter Plan Wound Cleansing: Wound #1 Midline Coccyx: Clean wound with Normal Saline. May Shower, gently pat wound dry prior to applying new dressing. Wound #10 Right,Medial Lower Leg: Clean wound with Normal Saline. May Shower, gently pat wound dry prior to applying new dressing. Wound #9 Left Elbow: Clean wound with Normal Saline. [name], [name] ([id number]) May Shower, gently pat wound dry prior to applying new dressing. Anesthetic (add to Medication List): Wound #1 Midline Coccyx: Topical Lidocaine 4% cream applied to wound bed prior to debridement (In Clinic Only). Wound #10 Right,Medial Lower Leg: Topical Lidocaine 4% cream applied to wound bed prior to debridement (In Clinic Only). Wound #9 Left Elbow: Topical Lidocaine 4% cream applied to wound bed prior to debridement (In Clinic Only). Skin Barriers/Peri-Wound Care: Wound #1 Midline Coccyx: Skin Prep - use only where the allevyn sticks not on reddened areas Antifungal powder-Nystatin - around peri-wound on coccyx (do not get in wound), under right breast and right arm, right abdomen Primary Wound Dressing: Wound #1 Midline Coccyx: Silver Collagen - prisma ag and pack into undermining as well slightly moisten with hydrogel or [place] Jelly Wound #10 Right,Medial Lower Leg: Xeroform Wound #9 Left Elbow: Silver Collagen - prisma ag and pack into undermining as well slightly moisten with hydrogel or [place] Jelly Secondary Dressing: Wound #1 Midline Coccyx: Boardered Foam Dressing - HHRN PLEASE ORDER ALLEVYN LIFE BORDERED FOAM FOR PATIENT (OFF BRANDS IRRITATE HER SKIN) Drawtex - HHRN to provide this for patient Wound #10 Right,Medial Lower Leg: Conform/Kerlix Non-adherent pad Wound #9 Left Elbow: Dry Gauze - secure with tape Foam Dressing Change Frequency: Wound #1 Midline Coccyx: Change dressing every day. Wound #10 Right,Medial Lower Leg: Change dressing every day. Wound #9 Left Elbow: Change dressing every  day. Follow-up Appointments: Return Appointment in 2 weeks. Off-Loading: Wound #1 Midline Coccyx: Roho cushion for wheelchair Mattress - fluidized air mattress Turn and reposition every 2 hours Additional Orders / Instructions: Wound #1 Midline Coccyx: Increase protein intake. - please add protein supplements to patients diet Other: - please add vitamin A, vitamin C and zinc supplements to patients diet Wound #10 Right,Medial Lower Leg: Other: - HHRN - Right hip foam and telfa island change every other day Home Health: Wound #1 Midline Coccyx: Continue Home Health Visits - [facility] Nurse may visit PRN to address patient s wound care needs. FACE TO FACE ENCOUNTER: MEDICARE and MEDICAID PATIENTS: I certify that this patient is under my care and that I had a face-to-face encounter that meets the physician face-to-face encounter requirements with this patient on this date. The encounter with the patient was in whole or in part for the following MEDICAL CONDITION: (primary reason for Home Healthcare) MEDICAL NECESSITY: I certify, that based on my findings, NURSING services are a medically necessary home health service. HOME BOUND STATUS: I certify that my clinical findings support that this patient is homebound (i.e., Due to [name], [name]. ([id number]) illness or injury, pt requires aid of supportive devices such as  crutches, cane, wheelchairs, walkers, the use of special transportation or the assistance of another person to leave their place of residence. There is a normal inability to leave the home and doing so requires considerable and taxing effort. Other absences are for medical reasons / religious services and are infrequent or of short duration when for other reasons). If current dressing causes regression in wound condition, may D/C ordered dressing product/s and apply Normal Saline Moist Dressing daily until next Wound Healing Center / Other MD appointment. Notify  Wound Healing Center of regression in wound condition at [phone]. Please direct any NON-WOUND related issues/requests for orders to patient's Primary Care Physician - Dr Vonita Moss Wound #10 Right,Medial Lower Leg: Continue Home Health Visits - [facility] Nurse may visit PRN to address patient s wound care needs. FACE TO FACE ENCOUNTER: MEDICARE and MEDICAID PATIENTS: I certify that this patient is under my care and that I had a face-to-face encounter that meets the physician face-to-face encounter requirements with this patient on this date. The encounter with the patient was in whole or in part for the following MEDICAL CONDITION: (primary reason for Home Healthcare) MEDICAL NECESSITY: I certify, that based on my findings, NURSING services are a medically necessary home health service. HOME BOUND STATUS: I certify that my clinical findings support that this patient is homebound (i.e., Due to illness or injury, pt requires aid of supportive devices such as crutches, cane, wheelchairs, walkers, the use of special transportation or the assistance of another person to leave their place of residence. There is a normal inability to leave the home and doing so requires considerable and taxing effort. Other absences are for medical reasons / religious services and are infrequent or of short duration when for other reasons). If current dressing causes regression in wound condition, may D/C ordered dressing product/s and apply Normal Saline Moist Dressing daily until next Wound Healing Center / Other MD appointment. Notify Wound Healing Center of regression in wound condition at [phone]. Please direct any NON-WOUND related issues/requests for orders to patient's Primary Care Physician - Dr Vonita Moss Wound #9 Left Elbow: Continue Home Health Visits - [facility] Health Nurse may visit PRN to address patient s wound care needs. FACE TO FACE ENCOUNTER: MEDICARE and MEDICAID  PATIENTS: I certify that this patient is under my care and that I had a face-to-face encounter that meets the physician face-to-face encounter requirements with this patient on this date. The encounter with the patient was in whole or in part for the following MEDICAL CONDITION: (primary reason for Home Healthcare) MEDICAL NECESSITY: I certify, that based on my findings, NURSING services are a medically necessary home health service. HOME BOUND STATUS: I certify that my clinical findings support that this patient is homebound (i.e., Due to illness or injury, pt requires aid of supportive devices such as crutches, cane, wheelchairs, walkers, the use of special transportation or the assistance of another person to leave their place of residence. There is a normal inability to leave the home and doing so requires considerable and taxing effort. Other absences are for medical reasons / religious services and are infrequent or of short duration when for other reasons). If current dressing causes regression in wound condition, may D/C ordered dressing product/s and apply Normal Saline Moist Dressing daily until next Wound Healing Center / Other MD appointment. Notify Wound Healing Center of regression in wound condition at [phone]. Please direct any NON-WOUND related issues/requests for orders to patient's Primary Care Physician -  Dr Vonita Moss Laboratory ordered were: Wound culture routine - coccyx The following medication(s) was prescribed: lidocaine topical 4 % cream 1 1 cream topical was prescribed at facility Augmentin oral 875 mg-125 mg tablet 1 1 tablet oral taken 2 times a day for 10 days starting 04/09/2018 At this time I'm gonna suggest that we go ahead and initiate the above wound care orders and continue the others that have previously been in place. We will see were things stand at follow-up actually in two weeks time. If anything changes in regard to the results of the culture we  will go ahead and start her on antibiotics as soon as possible. Patient's daughter is in agreement with plan. Please see above for specific wound care orders. We will see patient for re-evaluation in 2 week(s) here in the clinic. If anything worsens or changes patient will contact our office for additional recommendations. Electronic Signature(s) [name], [name] ([id number]) Signed: 04/11/2018 11:23:32 PM By: Lenda Kelp PA-C Previous Signature: 04/06/2018 11:53:57 PM Version By: Lenda Kelp PA-C Entered By: Lenda Kelp on 04/09/2018 10:20:29 [name] ([id number]) -------------------------------------------------------------------------------- ROS/PFSH Details Patient Name: [name] Date of Service: 04/06/2018 10:15 AM Medical Record Number: [id number] Patient Account Number: [id number] Date of Birth/Sex: 09/04/1923 (85 y.o. F) Treating RN: Phillis Haggis Primary Care Provider: Vonita Moss Other Clinician: Referring Provider: Vonita Moss Treating Provider/Extender: Linwood Dibbles, HOYT Weeks in Treatment: 67 Unable to Obtain Patient History due to oo Altered Mental Status Information Obtained From Patient Wound History Do you currently have one or more open woundso Yes How many open wounds do you currently haveo 1 Approximately how long have you had your woundso 1 week How have you been treating your wound(s) until nowo santyl and gauze Has your wound(s) ever healed and then re-openedo No Have you had any lab work done in the past montho No Have you tested positive for an antibiotic resistant organism (MRSA, VRE)o No Have you tested positive for osteomyelitis (bone infection)o No Have you had any tests for circulation on your legso No Constitutional Symptoms (General Health) Complaints and Symptoms: Negative for: Fever; Chills Eyes Medical History: Negative for: Cataracts; Glaucoma; Optic Neuritis Ear/Nose/Mouth/Throat Medical History: Negative for:  Chronic sinus problems/congestion; Middle ear problems Past Medical History Notes: dysphagia Hematologic/Lymphatic Medical History: Positive for: Anemia Negative for: Hemophilia; Human Immunodeficiency Virus; Lymphedema; Sickle Cell Disease Respiratory Complaints and Symptoms: No Complaints or Symptoms Medical History: Negative for: Aspiration; Asthma; Chronic Obstructive Pulmonary Disease (COPD); Pneumothorax; Sleep Apnea; Tuberculosis Cardiovascular [name], [name]. ([id number]) Complaints and Symptoms: No Complaints or Symptoms Medical History: Positive for: Hypertension Negative for: Angina; Arrhythmia; Congestive Heart Failure; Coronary Artery Disease; Deep Vein Thrombosis; Hypotension; Myocardial Infarction; Peripheral Arterial Disease; Peripheral Venous Disease; Phlebitis; Vasculitis Gastrointestinal Medical History: Negative for: Cirrhosis ; Colitis; Crohnos; Hepatitis A; Hepatitis B; Hepatitis C Endocrine Medical History: Negative for: Type I Diabetes; Type II Diabetes Genitourinary Medical History: Negative for: End Stage Renal Disease Immunological Medical History: Negative for: Lupus Erythematosus; Raynaudos; Scleroderma Integumentary (Skin) Medical History: Negative for: History of Burn; History of pressure wounds Musculoskeletal Medical History: Negative for: Gout; Rheumatoid Arthritis; Osteoarthritis; Osteomyelitis Neurologic Medical History: Positive for: Dementia Negative for: Neuropathy; Quadriplegia; Paraplegia; Seizure Disorder Past Medical History Notes: parkinsons, tremor Oncologic Medical History: Negative for: Received Chemotherapy; Received Radiation Psychiatric Complaints and Symptoms: No Complaints or Symptoms Medical History: Negative for: Anorexia/bulimia; Confinement Anxiety [name], ZOBRIST. ([id number]) Immunizations Pneumococcal Vaccine: Received Pneumococcal Vaccination: Yes Immunization Notes: up to date  Implantable  Devices Family and Social History Never smoker; Marital Status - Married; Alcohol Use: Never; Drug Use: No History; Caffeine Use: Never; Financial Concerns: No; Food, Clothing or Shelter Needs: No; Support System Lacking: No; Transportation Concerns: No; Advanced Directives: Yes (Not Provided); Patient does not want information on Advanced Directives; Medical Power of Attorney: Yes - dtr (Not Provided) Physician Affirmation I have reviewed and agree with the above information. Electronic Signature(s) Signed: 04/06/2018 11:53:57 PM By: Lenda Kelp PA-C Signed: 04/07/2018 5:25:23 PM By: Alejandro Mulling Entered By: Lenda Kelp on 04/06/2018 21:08:24 [name] ([id number]) -------------------------------------------------------------------------------- SuperBill Details Patient Name: [name] Date of Service: 04/06/2018 Medical Record Number: [id number] Patient Account Number: [id number] Date of Birth/Sex: 1923-07-06 (84 y.o. F) Treating RN: Phillis Haggis Primary Care Provider: Vonita Moss Other Clinician: Referring Provider: Vonita Moss Treating Provider/Extender: Linwood Dibbles, HOYT Weeks in Treatment: 64 Diagnosis Coding ICD-10 Codes Code Description L89.153 Pressure ulcer of sacral region, stage 3 L89.023 Pressure ulcer of left elbow, stage 3 G20 Parkinson's disease L03.312 Cellulitis of back [any part except buttock] S81.811D Laceration without foreign body, right lower leg, subsequent encounter Facility Procedures CPT4 Code: 78295621 Description: 99214 - WOUND CARE VISIT-LEV 4 EST PT Modifier: Quantity: 1 Physician Procedures CPT4 Code: 3086578 Description: 99213 - WC PHYS LEVEL 3 - EST PT ICD-10 Diagnosis Description L89.153 Pressure ulcer of sacral region, stage 3 L89.023 Pressure ulcer of left elbow, stage 3 G20 Parkinson's disease L03.312 Cellulitis of back [any part except buttock] Modifier: Quantity: 1 Electronic Signature(s) Signed: 04/06/2018  11:53:57 PM By: Lenda Kelp PA-C Entered By: Lenda Kelp on 04/06/2018 21:09:43

## 2018-04-09 LAB — AEROBIC CULTURE W GRAM STAIN (SUPERFICIAL SPECIMEN)

## 2018-04-09 LAB — AEROBIC CULTURE  (SUPERFICIAL SPECIMEN)

## 2018-04-20 ENCOUNTER — Encounter: Payer: Medicare Other | Admitting: Physician Assistant

## 2018-04-20 DIAGNOSIS — L89153 Pressure ulcer of sacral region, stage 3: Secondary | ICD-10-CM | POA: Diagnosis not present

## 2018-05-04 ENCOUNTER — Encounter: Payer: Medicare Other | Attending: Nurse Practitioner | Admitting: Nurse Practitioner

## 2018-05-04 DIAGNOSIS — Z888 Allergy status to other drugs, medicaments and biological substances status: Secondary | ICD-10-CM | POA: Insufficient documentation

## 2018-05-04 DIAGNOSIS — F0280 Dementia in other diseases classified elsewhere without behavioral disturbance: Secondary | ICD-10-CM | POA: Diagnosis not present

## 2018-05-04 DIAGNOSIS — L89023 Pressure ulcer of left elbow, stage 3: Secondary | ICD-10-CM | POA: Insufficient documentation

## 2018-05-04 DIAGNOSIS — Z882 Allergy status to sulfonamides status: Secondary | ICD-10-CM | POA: Insufficient documentation

## 2018-05-04 DIAGNOSIS — E11622 Type 2 diabetes mellitus with other skin ulcer: Secondary | ICD-10-CM | POA: Diagnosis not present

## 2018-05-04 DIAGNOSIS — I110 Hypertensive heart disease with heart failure: Secondary | ICD-10-CM | POA: Diagnosis not present

## 2018-05-04 DIAGNOSIS — L03312 Cellulitis of back [any part except buttock]: Secondary | ICD-10-CM | POA: Diagnosis not present

## 2018-05-04 DIAGNOSIS — L89153 Pressure ulcer of sacral region, stage 3: Secondary | ICD-10-CM | POA: Insufficient documentation

## 2018-05-04 DIAGNOSIS — Z880 Allergy status to penicillin: Secondary | ICD-10-CM | POA: Insufficient documentation

## 2018-05-04 DIAGNOSIS — G20 Parkinson's disease: Secondary | ICD-10-CM | POA: Insufficient documentation

## 2018-05-06 ENCOUNTER — Telehealth: Payer: Self-pay | Admitting: Family Medicine

## 2018-05-06 MED ORDER — CIPROFLOXACIN HCL 0.3 % OP SOLN: 1.0000 [drp] | OPHTHALMIC | 0 refills | Status: DC

## 2018-05-06 NOTE — Addendum Note (Signed)
Addended by: Vonita MossRISSMAN, Azarria Balint A on: 05/06/2018 02:21 PM   Modules accepted: Orders

## 2018-05-06 NOTE — Telephone Encounter (Signed)
Spoke with Felicia from [facility]. Message was relayed to nurse. They do need a refill sent to pharmacy.

## 2018-05-06 NOTE — Telephone Encounter (Signed)
Okay to use Cipro eyedrops if any left if not let us know we will call in some more.

## 2018-05-06 NOTE — Telephone Encounter (Signed)
Copied from CRM [id number]. Topic: General - Other >> May 06, 2018  9:54 AM Darletta MollLander, Lumin L wrote: Reason for CRM: Sunny SchleinFelicia, LPN with [facility] home [facility] calling to notify Dr. Dossie Arbourrissman that the patients right eye has a small ulcer like spot on outside corner that is weeping fluid and eye has discharge, somewhat pink. Please advise.

## 2018-05-06 NOTE — Telephone Encounter (Signed)
Ok to use ciprofloxacin (CILOXAN) 0.3 % ophthalmic solution If any left if not we will call in some more

## 2018-05-10 ENCOUNTER — Telehealth: Payer: Self-pay | Admitting: Family Medicine

## 2018-05-10 NOTE — Telephone Encounter (Signed)
Patient was transferred to provider for telephone conversation.   

## 2018-05-10 NOTE — Telephone Encounter (Signed)
Copied from CRM [id number]. Topic: General - Other >> May 10, 2018 10:58 AM Gean BirchwoodWilliams-Neal, Sade R wrote: Sunny SchleinFelicia from [facility] called in and stated when she went in to remove bandage on pt there was green drainage and red in color after green drainage cleaned up not swollen but red around the edges, no heat coming from the wound. Drainage was gathered under the skin.  CB# [phone]

## 2018-05-10 NOTE — Telephone Encounter (Signed)
Call Felicia Continue dressing wound and if looks like getting worse please notify us

## 2018-05-11 ENCOUNTER — Telehealth: Payer: Self-pay | Admitting: Family Medicine

## 2018-05-11 NOTE — Telephone Encounter (Signed)
1 drop q 2-4 hrs while awake stop when infection resolved

## 2018-05-11 NOTE — Telephone Encounter (Signed)
Copied from CRM [phone]. Topic: Quick Communication - Rx Refill/Question >> May 11, 2018  9:12 AM Stephannie LiSimmons, Muad Noga L, NT wrote: Medication:  ciprofloxacin (CILOXAN) 0.3 % ophthalmic solution the pharmacy called and would like to know which set of instructions to give to the patient   Has the patient contacted their pharmacy? yes  (Agent: If no, request that the patient contact the pharmacy for the refill (Agent: If yes, when and what did the pharmacy advise  Preferred Pharmacy (with phone number or street name [facility] [id number] [facility] [place], [place] - [address] [phone] (Phone) [phone] (Fax)    Agent: Please be advised that RX refills may take up to 3 business days. We ask that you follow-up with your pharmacy.

## 2018-05-11 NOTE — Telephone Encounter (Signed)
Called and gave the pharmacy the directions per Dr. Dossie Arbourrissman.

## 2018-05-14 NOTE — Progress Notes (Signed)
ARIA, JARRARD ([id number]) Visit Report for 05/04/2018 Arrival Information Details Patient Name: [name], [name] Date of Service: 05/04/2018 10:15 AM Medical Record Number: [id number] Patient Account Number: [id number] Date of Birth/Sex: 01/16/1923 (84 y.o. F) Treating RN: Curtis Sites Primary Care Victorio Creeden: Vonita Moss Other Clinician: Referring Bayyinah Dukeman: Vonita Moss Treating Koby Hartfield/Extender: Kathreen Cosier in Treatment: 66 Visit Information History Since Last Visit Added or deleted any medications: No Patient Arrived: Wheel Chair Any new allergies or adverse reactions: No Arrival Time: 10:30 Had a fall or experienced change in No activities of daily living that may affect Accompanied By: dtr and cg risk of falls: Transfer Assistance: Manual Signs or symptoms of abuse/neglect since No Patient Identification Verified: Yes last visito Secondary Verification Process Completed: Yes Hospitalized since last visit: No Patient Requires Transmission-Based No Implantable device outside of the clinic No Precautions: excluding Patient Has Alerts: No cellular tissue based products placed in the center since last visit: Has Dressing in Place as Prescribed: Yes Pain Present Now: Unable to Respond Electronic Signature(s) Signed: 05/04/2018 4:05:36 PM By: Curtis Sites Entered By: Curtis Sites on 05/04/2018 10:30:45 [name] ([id number]) -------------------------------------------------------------------------------- Encounter Discharge Information Details Patient Name: [name] Date of Service: 05/04/2018 10:15 AM Medical Record Number: [id number] Patient Account Number: [id number] Date of Birth/Sex: 1922/11/23 (84 y.o. F) Treating RN: Renne Crigler Primary Care Javar Eshbach: Vonita Moss Other Clinician: Referring Summer Mccolgan: Vonita Moss Treating Jarrett Albor/Extender: Kathreen Cosier in Treatment: 7 Encounter Discharge Information Items Discharge  Condition: Stable Ambulatory Status: Wheelchair Discharge Destination: Home Transportation: Private Auto Schedule Follow-up Appointment: Yes Clinical Summary of Care: Electronic Signature(s) Signed: 05/04/2018 3:57:43 PM By: Renne Crigler Entered By: Renne Crigler on 05/04/2018 11:29:59 [name] ([id number]) -------------------------------------------------------------------------------- Lower Extremity Assessment Details Patient Name: [name] Date of Service: 05/04/2018 10:15 AM Medical Record Number: [id number] Patient Account Number: [id number] Date of Birth/Sex: 02/12/1923 (84 y.o. F) Treating RN: Curtis Sites Primary Care Hayato Guaman: Vonita Moss Other Clinician: Referring Lorilynn Lehr: Vonita Moss Treating Karimah Winquist/Extender: Kathreen Cosier in Treatment: 29 Electronic Signature(s) Signed: 05/04/2018 4:05:36 PM By: Curtis Sites Entered By: Curtis Sites on 05/04/2018 10:34:54 [name] ([id number]) -------------------------------------------------------------------------------- Multi Wound Chart Details Patient Name: [name] Date of Service: 05/04/2018 10:15 AM Medical Record Number: [id number] Patient Account Number: [id number] Date of Birth/Sex: Feb 18, 1923 (84 y.o. F) Treating RN: Phillis Haggis Primary Care Marilea Gwynne: Vonita Moss Other Clinician: Referring Saren Corkern: Vonita Moss Treating Jessie Cowher/Extender: Kathreen Cosier in Treatment: 61 Vital Signs Height(in): 60 Pulse(bpm): 54 Weight(lbs): 90 Blood Pressure(mmHg): 121/40 Body Mass Index(BMI): 18 Temperature(F): 97.6 Respiratory Rate 14 (breaths/min): Photos: [1:No Photos] [11:No Photos] [9:No Photos] Wound Location: [1:Coccyx - Midline] [11:Right Upper Leg] [9:Left Elbow] Wounding Event: [1:Pressure Injury] [11:Trauma] [9:Trauma] Primary Etiology: [1:Pressure Ulcer] [11:Skin Tear] [9:Trauma, Other] Comorbid History: [1:Anemia, Hypertension, Dementia]  [11:Anemia, Hypertension, Dementia] [9:Anemia, Hypertension, Dementia] Date Acquired: [1:12/01/2016] [11:04/20/2018] [9:02/16/2018] Weeks of Treatment: [1:68] [11:0] [9:10] Wound Status: [1:Open] [11:Open] [9:Open] Measurements L x W x D [1:3.5x0.9x0.6] [11:0.1x0.5x0.1] [9:1.9x1.2x0.1] (cm) Area (cm) : [1:2.474] [11:0.039] [9:1.791] Volume (cm) : [1:1.484] [11:0.004] [9:0.179] % Reduction in Area: [1:40.50%] [11:N/A] [9:-48.00%] % Reduction in Volume: [1:10.70%] [11:N/A] [9:-47.90%] Starting Position 1 [1:6] (o'clock): Ending Position 1 [1:11] (o'clock): Maximum Distance 1 (cm): [1:1.5] Undermining: [1:Yes] [11:No] [9:No] Classification: [1:Category/Stage IV] [11:Partial Thickness] [9:Full Thickness Without Exposed Support Structures] Exudate Amount: [1:Large] [11:Small] [9:Small] Exudate Type: [1:Serosanguineous] [11:Serous] [9:Serous] Exudate Color: [1:red, brown] [11:amber] [9:amber] Wound Margin: [1:Flat and Intact] [11:Flat and Intact] [9:Distinct, outline attached] Granulation Amount: [1:Large (67-100%)] [  11:Medium (34-66%)] [9:Large (67-100%)] Granulation Quality: [1:Red, Hyper-granulation] [11:Pink] [9:Red, Pink] Necrotic Amount: [1:None Present (0%)] [11:Medium (34-66%)] [9:Small (1-33%)] Necrotic Tissue: [1:N/A] [11:Eschar] [9:N/A] Exposed Structures: [1:Fat Layer (Subcutaneous Tissue) Exposed: Yes Muscle: Yes Fascia: No Tendon: No] [11:Fascia: No Fat Layer (Subcutaneous Tissue) Exposed: No Tendon: No Muscle: No] [9:Fascia: No Fat Layer (Subcutaneous Tissue) Exposed: No Tendon: No Muscle: No] Joint: No Joint: No Joint: No Bone: No Bone: No Bone: No Epithelialization: None Large (67-100%) None Periwound Skin Texture: Induration: Yes Excoriation: No No Abnormalities Noted Rash: Yes Induration: No Excoriation: No Callus: No Callus: No Crepitus: No Crepitus: No Rash: No Scarring: No Scarring: No Periwound Skin Moisture: Maceration: Yes Maceration: No Dry/Scaly:  Yes Dry/Scaly: No Dry/Scaly: No Periwound Skin Color: Erythema: Yes Atrophie Blanche: No Erythema: Yes Atrophie Blanche: No Cyanosis: No Cyanosis: No Ecchymosis: No Ecchymosis: No Erythema: No Hemosiderin Staining: No Hemosiderin Staining: No Mottled: No Mottled: No Pallor: No Pallor: No Rubor: No Rubor: No Erythema Location: Circumferential N/A Circumferential Temperature: No Abnormality No Abnormality No Abnormality Tenderness on Palpation: Yes No Yes Wound Preparation: Ulcer Cleansing: Ulcer Cleansing: Ulcer Cleansing: Rinsed/Irrigated with Saline Rinsed/Irrigated with Saline Rinsed/Irrigated with Saline Topical Anesthetic Applied: Topical Anesthetic Applied: Topical Anesthetic Applied: Other: lidocaine 4% Other: lidocaine 4% Other: lidocaine 4% Treatment Notes Electronic Signature(s) Signed: 05/04/2018 3:52:40 PM By: Alejandro Mulling Entered By: Alejandro Mulling on 05/04/2018 10:59:43 [name] ([id number]) -------------------------------------------------------------------------------- Multi-Disciplinary Care Plan Details Patient Name: [name] Date of Service: 05/04/2018 10:15 AM Medical Record Number: [id number] Patient Account Number: [id number] Date of Birth/Sex: [birth year] (84 y.o. F) Treating RN: Phillis Haggis Primary Care Enrique Weiss: Vonita Moss Other Clinician: Referring Stelios Kirby: Vonita Moss Treating Angelgabriel Willmore/Extender: Kathreen Cosier in Treatment: 26 Active Inactive ` Abuse / Safety / Falls / Self Care Management Nursing Diagnoses: Impaired physical mobility Potential for falls Goals: Patient will remain injury free Date Initiated: 01/07/2017 Target Resolution Date: 04/03/2017 Goal Status: Active Interventions: Assess fall risk on admission and as needed Notes: ` Nutrition Nursing Diagnoses: Potential for alteratiion in Nutrition/Potential for imbalanced nutrition Goals: Patient/caregiver agrees to and verbalizes  understanding of need to use nutritional supplements and/or vitamins as prescribed Date Initiated: 01/07/2017 Target Resolution Date: 04/03/2017 Goal Status: Active Interventions: Assess patient nutrition upon admission and as needed per policy Notes: ` Orientation to the Wound Care Program Nursing Diagnoses: Knowledge deficit related to the wound healing center program Goals: Patient/caregiver will verbalize understanding of the Wound Healing Center Program Date Initiated: 01/07/2017 Target Resolution Date: 04/03/2017 Goal Status: Active TEKA, CHANDA ([id number]) Interventions: Provide education on orientation to the wound center Notes: ` Pressure Nursing Diagnoses: Knowledge deficit related to causes and risk factors for pressure ulcer development Goals: Patient will remain free from development of additional pressure ulcers Date Initiated: 01/07/2017 Target Resolution Date: 04/03/2017 Goal Status: Active Interventions: Assess potential for pressure ulcer upon admission and as needed Notes: ` Wound/Skin Impairment Nursing Diagnoses: Impaired tissue integrity Goals: Patient/caregiver will verbalize understanding of skin care regimen Date Initiated: 01/07/2017 Target Resolution Date: 04/03/2017 Goal Status: Active Ulcer/skin breakdown will have a volume reduction of 30% by week 4 Date Initiated: 01/07/2017 Target Resolution Date: 04/03/2017 Goal Status: Active Ulcer/skin breakdown will have a volume reduction of 50% by week 8 Date Initiated: 01/07/2017 Target Resolution Date: 04/03/2017 Goal Status: Active Ulcer/skin breakdown will have a volume reduction of 80% by week 12 Date Initiated: 01/07/2017 Target Resolution Date: 04/03/2017 Goal Status: Active Ulcer/skin breakdown will heal within 14 weeks Date Initiated: 01/07/2017 Target Resolution Date:  04/03/2017 Goal Status: Active Interventions: Assess patient/caregiver ability to obtain necessary supplies Assess  patient/caregiver ability to perform ulcer/skin care regimen upon admission and as needed Assess ulceration(s) every visit Notes: Electronic Signature(s) SIANNI, CLONINGER ([id number]) Signed: 05/04/2018 3:52:40 PM By: Alejandro Mulling Entered By: Alejandro Mulling on 05/04/2018 10:59:35 [name] ([id number]) -------------------------------------------------------------------------------- Pain Assessment Details Patient Name: [name] Date of Service: 05/04/2018 10:15 AM Medical Record Number: [id number] Patient Account Number: [id number] Date of Birth/Sex: [birth year] (84 y.o. F) Treating RN: Curtis Sites Primary Care Zakayla Martinec: Vonita Moss Other Clinician: Referring Kano Heckmann: Vonita Moss Treating Tonyetta Berko/Extender: Kathreen Cosier in Treatment: 76 Active Problems Location of Pain Severity and Description of Pain Patient Has Paino Patient Unable to Respond Site Locations Pain Management and Medication Current Pain Management: Electronic Signature(s) Signed: 05/04/2018 4:05:36 PM By: Curtis Sites Entered By: Curtis Sites on 05/04/2018 10:34:20 [name] ([id number]) -------------------------------------------------------------------------------- Patient/Caregiver Education Details Patient Name: [name] Date of Service: 05/04/2018 10:15 AM Medical Record Number: [id number] Patient Account Number: [id number] Date of Birth/Gender: 12/07/1922 (84 y.o. F) Treating RN: Renne Crigler Primary Care Physician: Vonita Moss Other Clinician: Referring Physician: Vonita Moss Treating Physician/Extender: Kathreen Cosier in Treatment: 81 Education Assessment Education Provided To: Patient Education Topics Provided Wound/Skin Impairment: Handouts: Caring for Your Ulcer Methods: Explain/Verbal Responses: State content correctly Electronic Signature(s) Signed: 05/04/2018 3:57:43 PM By: Renne Crigler Entered By: Renne Crigler on  05/04/2018 11:31:48 [name] ([id number]) -------------------------------------------------------------------------------- Wound Assessment Details Patient Name: [name] Date of Service: 05/04/2018 10:15 AM Medical Record Number: [id number] Patient Account Number: [id number] Date of Birth/Sex: 1922-12-24 (84 y.o. F) Treating RN: Curtis Sites Primary Care Rondey Fallen: Vonita Moss Other Clinician: Referring Micai Apolinar: Vonita Moss Treating Monnica Saltsman/Extender: Kathreen Cosier in Treatment: 68 Wound Status Wound Number: 1 Primary Etiology: Pressure Ulcer Wound Location: Coccyx - Midline Wound Status: Open Wounding Event: Pressure Injury Comorbid History: Anemia, Hypertension, Dementia Date Acquired: 12/01/2016 Weeks Of Treatment: 68 Clustered Wound: No Photos Photo Uploaded By: Curtis Sites on 05/04/2018 12:24:28 Wound Measurements Length: (cm) 3.5 Width: (cm) 0.9 Depth: (cm) 0.6 Area: (cm) 2.474 Volume: (cm) 1.484 % Reduction in Area: 40.5% % Reduction in Volume: 10.7% Epithelialization: None Tunneling: No Undermining: Yes Starting Position (o'clock): 6 Ending Position (o'clock): 11 Maximum Distance: (cm) 1.5 Wound Description Classification: Category/Stage IV Wound Margin: Flat and Intact Exudate Amount: Large Exudate Type: Serosanguineous Exudate Color: red, brown Foul Odor After Cleansing: No Slough/Fibrino Yes Wound Bed Granulation Amount: Large (67-100%) Exposed Structure Granulation Quality: Red, Hyper-granulation Fascia Exposed: No Necrotic Amount: None Present (0%) Fat Layer (Subcutaneous Tissue) Exposed: Yes Tendon Exposed: No Muscle Exposed: Yes CARINNE, BRANDENBURGER. ([id number]) Necrosis of Muscle: No Joint Exposed: No Bone Exposed: No Periwound Skin Texture Texture Color No Abnormalities Noted: No No Abnormalities Noted: No Callus: No Atrophie Blanche: No Crepitus: No Cyanosis: No Excoriation: No Ecchymosis:  No Induration: Yes Erythema: Yes Rash: Yes Erythema Location: Circumferential Scarring: No Hemosiderin Staining: No Mottled: No Moisture Pallor: No No Abnormalities Noted: No Rubor: No Dry / Scaly: No Maceration: Yes Temperature / Pain Temperature: No Abnormality Tenderness on Palpation: Yes Wound Preparation Ulcer Cleansing: Rinsed/Irrigated with Saline Topical Anesthetic Applied: Other: lidocaine 4%, Treatment Notes Wound #1 (Midline Coccyx) 1. Cleansed with: Clean wound with Normal Saline 2. Anesthetic Topical Lidocaine 4% cream to wound bed prior to debridement 4. Dressing Applied: Prisma Ag Notes drawtex, boarder foam Electronic Signature(s) Signed: 05/04/2018 4:05:36 PM By: Curtis Sites Entered By: Curtis Sites on 05/04/2018 10:42:40 Dillenbeck, Adaliz  Lacretia NicksW. ([id number]) -------------------------------------------------------------------------------- Wound Assessment Details Patient Name: Tina MainlandSHAW, Niccole W. Date of Service: 05/04/2018 10:15 AM Medical Record Number: [id number] Patient Account Number: [id number] Date of Birth/Sex: 27-May-1923 (84 y.o. F) Treating RN: Phillis HaggisPinkerton, Debi Primary Care Julissa Browning: Vonita MossRISSMAN, MARK Other Clinician: Referring Melik Blancett: Vonita MossRISSMAN, MARK Treating Labrandon Knoch/Extender: Kathreen Cosieroulter, Leah Weeks in Treatment: 4268 Wound Status Wound Number: 11 Primary Etiology: Skin Tear Wound Location: Right Upper Leg Wound Status: Healed - Epithelialized Wounding Event: Trauma Comorbid History: Anemia, Hypertension, Dementia Date Acquired: 04/20/2018 Weeks Of Treatment: 0 Clustered Wound: No Photos Photo Uploaded By: Curtis Sitesorthy, Joanna on 05/04/2018 12:24:29 Wound Measurements Length: (cm) 0 Width: (cm) 0 Depth: (cm) 0 Area: (cm) 0 Volume: (cm) 0 % Reduction in Area: % Reduction in Volume: Epithelialization: Large (67-100%) Tunneling: No Undermining: No Wound Description Classification: Partial Thickness Foul O Wound Margin: Flat and Intact  Slough Exudate Amount: None Present dor After Cleansing: No /Fibrino No Wound Bed Granulation Amount: None Present (0%) Exposed Structure Necrotic Amount: None Present (0%) Fascia Exposed: No Fat Layer (Subcutaneous Tissue) Exposed: No Tendon Exposed: No Muscle Exposed: No Joint Exposed: No Bone Exposed: No Periwound Skin Texture Texture Color No Abnormalities Noted: No No Abnormalities Noted: No Tina MainlandSHAW, Cyleigh W. ([id number]) Callus: No Atrophie Blanche: No Crepitus: No Cyanosis: No Excoriation: No Ecchymosis: No Induration: No Erythema: No Rash: No Hemosiderin Staining: No Scarring: No Mottled: No Pallor: No Moisture Rubor: No No Abnormalities Noted: No Dry / Scaly: No Temperature / Pain Maceration: No Temperature: No Abnormality Wound Preparation Ulcer Cleansing: Rinsed/Irrigated with Saline Topical Anesthetic Applied: None Electronic Signature(s) Signed: 05/04/2018 3:52:40 PM By: Alejandro MullingPinkerton, Debra Entered By: Alejandro MullingPinkerton, Debra on 05/04/2018 11:05:32 Tina MainlandSHAW, Janet W. ([id number]) -------------------------------------------------------------------------------- Wound Assessment Details Patient Name: Tina MainlandSHAW, Regan W. Date of Service: 05/04/2018 10:15 AM Medical Record Number: [id number] Patient Account Number: [id number] Date of Birth/Sex: 27-May-1923 (84 y.o. F) Treating RN: Curtis Sitesorthy, Joanna Primary Care Laporcha Marchesi: Vonita MossRISSMAN, MARK Other Clinician: Referring Gemini Beaumier: Vonita MossRISSMAN, MARK Treating Adela Esteban/Extender: Kathreen Cosieroulter, Leah Weeks in Treatment: 68 Wound Status Wound Number: 9 Primary Etiology: Trauma, Other Wound Location: Left Elbow Wound Status: Open Wounding Event: Trauma Comorbid History: Anemia, Hypertension, Dementia Date Acquired: 02/16/2018 Weeks Of Treatment: 10 Clustered Wound: No Photos Photo Uploaded By: Curtis Sitesorthy, Joanna on 05/04/2018 12:24:50 Wound Measurements Length: (cm) 1.9 Width: (cm) 1.2 Depth: (cm) 0.1 Area: (cm) 1.791 Volume: (cm)  0.179 % Reduction in Area: -48% % Reduction in Volume: -47.9% Epithelialization: None Tunneling: No Undermining: No Wound Description Full Thickness Without Exposed Support Foul O Classification: Structures Slough Wound Margin: Distinct, outline attached Exudate Small Amount: Exudate Type: Serous Exudate Color: amber dor After Cleansing: No /Fibrino Yes Wound Bed Granulation Amount: Large (67-100%) Exposed Structure Granulation Quality: Red, Pink Fascia Exposed: No Necrotic Amount: Small (1-33%) Fat Layer (Subcutaneous Tissue) Exposed: No Tendon Exposed: No Muscle Exposed: No Joint Exposed: No Bone Exposed: No Tina MainlandSHAW, Destinee W. ([id number]) Periwound Skin Texture Texture Color No Abnormalities Noted: No No Abnormalities Noted: No Erythema: Yes Moisture Erythema Location: Circumferential No Abnormalities Noted: No Dry / Scaly: Yes Temperature / Pain Temperature: No Abnormality Tenderness on Palpation: Yes Wound Preparation Ulcer Cleansing: Rinsed/Irrigated with Saline Topical Anesthetic Applied: Other: lidocaine 4%, Treatment Notes Wound #9 (Left Elbow) 1. Cleansed with: Clean wound with Normal Saline 2. Anesthetic Topical Lidocaine 4% cream to wound bed prior to debridement 4. Dressing Applied: Xeroform Notes conform and stretch net Electronic Signature(s) Signed: 05/04/2018 4:05:36 PM By: Curtis Sitesorthy, Joanna Entered By: Curtis Sitesorthy, Joanna on 05/04/2018 10:47:36 Bouchie, Myles GipETTALENE W. ([id number]) -------------------------------------------------------------------------------- Vitals Details Patient Name:  Starner, Venda W. Date of Service: 05/04/2018 10:15 AM Medical Record Number: [name] Patient Account Number: [id number] Date of Birth/Sex: 1923/09/26 (84 y.o. F) Treating RN: Curtis Sitesorthy, Joanna Primary Care Zarin Knupp: Vonita MossRISSMAN, MARK Other Clinician: Referring Ilamae Geng: Vonita MossRISSMAN, MARK Treating Gurnoor Sloop/Extender: Kathreen Cosieroulter, Leah Weeks in Treatment: 4868 Vital Signs Time  Taken: 10:34 Temperature (F): 97.6 Height (in): 60 Pulse (bpm): 54 Weight (lbs): 90 Respiratory Rate (breaths/min): 14 Body Mass Index (BMI): 17.6 Blood Pressure (mmHg): 121/40 Reference Range: 80 - 120 mg / dl Electronic Signature(s) Signed: 05/04/2018 4:05:36 PM By: Curtis Sitesorthy, Joanna Entered By: Curtis Sitesorthy, Joanna on 05/04/2018 10:34:44

## 2018-05-14 NOTE — Progress Notes (Signed)
BEATRIZ, QUINTELA ([id number]) Visit Report for 05/04/2018 Chief Complaint Document Details Patient Name: [name], [name] Date of Service: 05/04/2018 10:15 AM Medical Record Number: [id number] Patient Account Number: [id number] Date of Birth/Sex: 06-30-1923 (84 y.o. F) Treating RN: Phillis Haggis Primary Care Provider: Vonita Moss Other Clinician: Referring Provider: Vonita Moss Treating Provider/Extender: Kathreen Cosier in Treatment: 33 Information Obtained from: Patient Chief Complaint multiple wounds/ulcers Electronic Signature(s) Signed: 05/04/2018 11:20:26 AM By: Bonnell Public Entered By: Bonnell Public on 05/04/2018 11:20:26 [name] ([id number]) -------------------------------------------------------------------------------- Debridement Details Patient Name: [name] Date of Service: 05/04/2018 10:15 AM Medical Record Number: [id number] Patient Account Number: [id number] Date of Birth/Sex: 08/09/1923 (84 y.o. F) Treating RN: Phillis Haggis Primary Care Provider: Vonita Moss Other Clinician: Referring Provider: Vonita Moss Treating Provider/Extender: Kathreen Cosier in Treatment: 68 Debridement Performed for Wound #1 Midline Coccyx Assessment: Performed By: Physician Bonnell Public, NP Debridement Type: Debridement Pre-procedure Verification/Time Yes - 11:01 Out Taken: Start Time: 11:01 Pain Control: Lidocaine 4% Topical Solution Total Area Debrided (L x W): 3.5 (cm) x 0.9 (cm) = 3.15 (cm) Tissue and other material Viable, Non-Viable, Callus, Slough, Subcutaneous, Fibrin/Exudate, Slough debrided: Level: Skin/Subcutaneous Tissue Debridement Description: Excisional Instrument: Curette Bleeding: Minimum Hemostasis Achieved: Pressure End Time: 11:03 Procedural Pain: 0 Post Procedural Pain: 0 Response to Treatment: Procedure was tolerated well Level of Consciousness: Awake and Alert Post Debridement Measurements of Total  Wound Length: (cm) 3.5 Stage: Category/Stage IV Width: (cm) 0.9 Depth: (cm) 0.7 Volume: (cm) 1.732 Character of Wound/Ulcer Post Requires Further Debridement Debridement: Post Procedure Diagnosis Same as Pre-procedure Electronic Signature(s) Signed: 05/04/2018 3:45:39 PM By: Bonnell Public Signed: 05/04/2018 3:52:40 PM By: Alejandro Mulling Entered By: Alejandro Mulling on 05/04/2018 11:02:45 [name] ([id number]) -------------------------------------------------------------------------------- HPI Details Patient Name: [name] Date of Service: 05/04/2018 10:15 AM Medical Record Number: [id number] Patient Account Number: [id number] Date of Birth/Sex: 1923/06/12 (84 y.o. F) Treating RN: Phillis Haggis Primary Care Provider: Vonita Moss Other Clinician: Referring Provider: Vonita Moss Treating Provider/Extender: Kathreen Cosier in Treatment: 52 History of Present Illness HPI Description: 01/07/17 this is a 84 year old woman admitted to the clinic today for review of a pressure ulcer on her lower sacrum. She is referred from her primary physician's office after being seen on 3/22 with a 3 cm pressure area. Her daughter and caretaker accompanied her today state that the area first became obvious about a month ago and his since deteriorated. They have recently got [name] home health involved and have been using [name] to the wound. They have ordered a pressure relief surface for her mattress. They are turning her religiously. They state that she eats well and they've been forcing fluids on her. She is on a multivitamin. Looking through [facility] point last albumin I see was 4.4 on 10/28. The patient has advanced parkinsonism which looks superficially like advanced Parkinson's disease although her daughter tells me she did not ever respond to Sinemet therefore this may have another pathology with signs of parkinsonism. However I think this is largely a mute point  currently. She also has dementia and is nonambulatory. Since this started they have been keeping her in bed and turning her religiously every 2 hours. She lives at home in [place] with her husband with 24/7 care giving 01/13/17 santyl change qd. Still will require further debridement. continue santyl. 01/20/17; patient's wound actually looks some better less adherent necrotic surface. There is actually visible granulation. We're using [name] 01/27/17; better-looking surface but still a  lot of necrotic tissue on the base of this wound. The periwound erythema is better than last week we are still using [name]. Her daughter tells [place] that she is still having trouble with the pressure-relief mattress through medical modalities 02/03/18; I had the patient scheduled for a two week followup however her daughter brought her in early concerned for discoloration on 2 areas of the wound circumference. We have bee using santyl 02/10/17;Better looking surface to the wound. Rim appears better suggesting better offloading. Using santyl 02/24/17; change to Silver Collegen last time. Wound appears better. 03/10/17; still using silver collagen religious offloading. Intake is satisfactory per her daughter. Dimension slightly better 03/12/2017 -- Dr. Jannetta Quint patient who had been seen 2 days ago and was doing fairly well. The patient is brought in by her daughter who noticed a new wound just above the previous wound on her sacral area and going on more to the left lateral side. She was very concerned and we asked her to get in for an opinion. 03/17/17; above is noted. The patient has developed a progressive area to the left of her original wound. This seems to this started with a ring of red skin with a more pale interior almost looking fungal. There was a rim of blister through part of the area although this did not look like zoster. They have been applying triamcinolone that was prescribed last week by Dr. Meyer Russel and the  area has a fold to a linear band area which is confluent, erythematous and with obvious epidermal swelling but there is no overt tenderness or crepitus. She has lost some surface epithelium closer to the wound surface itself and now has a more superficial wound in this area and the extending erythema goes towards the left buttock. This is well demarcated between involved in normal skin but once again does not appear to be at all tender. If there is a contact issue here I cannot get the history out of the daughter or the caregiver that are with her. 03/24/17; the patient arrives today with the wound slightly worse slightly more drainage. The bandlike area of erythema that I treated as a possible pineal infection has improved somewhat although proximally is still has confluent erythema without overt tenderness. We have been using silver alginate since the most recent deterioration. To the bandlike degree of erythema we have been using Lotrisone cream 03/31/17; patient arrives today with the wound slightly larger, necrotic surface and surrounding erythema. The bandlike area of erythema that I treated as a possible pineal infection is less swollen but still present I've been using Lotrisone cream on that largely related to the presence of a tinea looking infection when this was first seen. We've been using silver alginate. X-ray that I ordered last week showed no acute bony abnormalities mild fecal impaction. Lab work showed a comprehensive metabolic panel that was normal including an albumin of 3.8. White count was 9.5 hemoglobin 12.3 differential count normal. C-reactive protein was less than 1 and sedimentation rate and 17. The latter 2 values does not support an ongoing bacterial infection. 04/14/17; patient arrives after a 2 week hiatus. Her wound is not in good condition. Although the base of the wound looks SAIGE, CANTON. ([id number]) stable she still has an erythematous area that was apparently  blistered over the weekend. This again points to the left. As our intake nurse pointed out today this is in the area where the tissue folds together and we may need to prevent try to  prevent this. Lab work and x-ray that I did to 3 weeks ago were unremarkable including her albumin nevertheless she is an extremely frail condition physically. We have have been using [name] 04/22/17; I changed her to silver alginate because of the surrounding maceration and moisture last week. The daughter did not like the way the wound looked in the middle of the week and changed her back to [place]. They're putting gauze on top of this. Thinks still using Lotrisone. 05/06/17 on evaluation today patient sacral wound appears to be doing okay and does not seem to be any worse. She is having no significant pain during evaluation today the secondary to mental status she is unable to rate or describe whether she had any pain she was not however flinching. Her daughter states that the wound does appear to be looking better to her. Still we are having difficulty with the skinfold that seems to be closing in on itself at this point. All in all I feel like she is making some good progress in the [name] seems to be the official for her. They do tell me that a refill if we are gonna continue that today. No fevers, chills, nausea, or vomiting noted at this time. 05/13/17 presents today for evaluation concerning her ongoing sacral pressure ulcer. Unfortunately she also has an area of deep tissue injury in the right Ischial region which is starting to show up. The sacral wound also continues to show signs of necrotic tissue overlying and has declined. Overall we really have not seen a significant improvement in the past several months in regard to the sacral wound and now patient is starting to develop a new wound in the right Ischial region. Obviously this is not trending in the direction that we want to see. No fevers, chills, nausea,  or vomiting noted at this time. 05/19/17; I have not seen this wound and almost a month however there is nothing really positive to say about it. Necrotic tissue over the surface which superiorly I think abuts on her sacrum. She has surrounding erythema. I would be surprised if there is not underlying osteomyelitis or soft tissue infection. 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 She apparently eats well per description although I wonder about this looking at her. Lab work I did probably 4 weeks ago however was really quite normal including a serum albumin 05/26/17; culture I did last week grew Escherichia coli and methicillin sensitive staph aureus which should've been well covered by the Augmentin and ciprofloxacin. Indeed the erythema around the wound in the bed of the wound looks somewhat better. Her intake is still satisfactory. They now have a near fluidized bed 06/02/17; they completed the antibiotics last Friday. Using collagen. Daughter still reports eating and drinking well. There is less visualized erythema around the wound. 06/16/17; large pressure ulcer over her lower sacrum and coccyx. Using [name] to the wound bed. 06/30/17; certainly no change in dimensions of this large stage III wound over her sacrum and coccyx. They've been using [name]. There is no exposed bone. She has a candidal/tinea area in the right inguinal area. Other than that her daughter relates that she is eating and drinking well there changing her positioning to make sure the areas offloaded 07/14/17; no major change in the dimensions of this large stage III wound. Initially a smaller wound that became secondarily infected causing significant tissue breakdown although it is been stable in the last several weeks. Tunneling superiorly at roughly 1:00 no  change here either. There is no bone palpable. Both the patient's daughter and caretaker states that she eats well. I have not rechecked her blood  work 07/27/17; patient arrives in clinic today and generally a deteriorated looking state. Mild fever with axillary temperature of 100.4. Daughter reports she has not been eating and drinking well since yesterday. She looks more pale and thin and less responsive. We have been using silver collagen to her wound 08/11/17; since the last time the patient was here things have gone better. Her fever went down and she started eating and drinking again. Culture I did of the wound showed methicillin sensitive staph aureus, Morganella and enterococcus. I only treated her with Keflex which would've not covered the Morganella and enterococcus however the purulent area on the 2:00 side of her wound is a lot better and the bandlike erythema that concern me also was resolved. I'd called the daughter last week to confirm that she was a lot better. She finished the Keflex last Thursday she also suffered a skin tear this morning perhaps while putting on her incontinence brief period is on the lateral aspect of her right leg. Clean wound with the surface epithelium not viable. 08/25/17; last week the patient was noted to have erythema around the wound margin and a slight fever which the patient's daughter says was 57. Our office was contacted by home health however we did not have a space to work the patient in that she went to see her primary physician Dr. Maurice March. She was not febrile during this visit on 08/21/17 there was erythema around the wound similar to last occasion. Dr. Maurice March in reference to my culture from 07/28/17 and put her on doxycycline capsules which they're opening twice a day for 10 days. 09/17/17; patient arrives today with the wound bed looking fairly well granulated. There is undermining from 4 to 6:00 although this seems to of contracted slightly. She does not have obvious infection although the daughter states there was some darkening of the wound circumference that is not evident today. They  state she is eating well. They are concerned about oral thrush 09/30/16; very fibrin looking granulated wound bed. Her undermining from 4 to 6:00 is about the same but also appears to be well granulated. She has rolled edges of senescent tissue from roughly 7 to 12:00. There is no evidence of infection [name], [name] ([id number]) 10/13/17 on evaluation today patient appears to be doing fairly well all things considered in regard to her sacral wound. There's really not a lot of significant change or improvement she does have some evidence of contusion and deep tissue injury around the left border of the wound that patient's daughter did inquire about today. Nonetheless overall the wound appears to be doing about the same in my opinion. There is no significant indication of infection there also is no significant slough noted at this point. 10/27/17 She is here in follow up evaluation of a sacral ulcer. She is accompanied by her daughter and caregiver. There is red granulation tissue throughout, persistent discoloration to left border; this appears consistent with deep tissue injury. There are multiple areas covered in foam borders, tegaderm, etc that are "preventative" with "no wounds". We will continue with prisma and continue with two week follow ups 11/17/17 on evaluation today patient appears to be doing decently well in regard to her wound at this point. She continues to have a sacral wound ulcer. We see her roughly every two weeks. In the last  week she was actually in the hospital due to what was diagnosed as sepsis although no organisms were ever identified in the actual blood cultures. The hospital really was not sure that the wound was the cause of the infection but they really did not figure out anything else that would be a causative organism she did have a CT scan and that revealed no evidence of pneumonia there was also no evidence of urinary tract infection. Again it very well could have  been the wound called in those although wound appears to be doing fairly well at this point. 12/15/17 on evaluation today patient actually appears to be doing about the same in regard to her sacral ulcer. The one thing different is that she does seem to have a rash where her right arm is contracted and being held to the thorax. The area underlying both on the ventral side of the arm as well is the thorax where it comes in contact shows evidence of a rash which appears to be fungal in nature. There does not appear to be any evidence of infection lies at this point. There does not appear to be a rash consistent with shingles which was also of concern initially. No fevers, chills, nausea, or vomiting noted at this time. 12/29/17 on evaluation today patient appears to be doing a little better in regard to her sacral wound although the one changes the 12 o'clock location of the wound seems to have attached at one point which no longer allows this to pull back. This has caused an area of undermining that seems to be attaching as well in the 12 o'clock location. I do think that this is something that can be managed and is not necessarily a bad thing. Nonetheless she does seem to have some pain with exploration of this region of undermining. She continues to have an area under her right arm on the chest wall of erythema although this is a little better especially after the home health nurse is actually got in order for Diflucan times one for the patient. She is a new area on the left wrist that looks like because of the contraction her nail may have pushed in on her wrist area causing a slight cut which subsequently became infected. She has some honey crusted drainage noted. 03/09/18 undervaluation today patient appears to be doing fairly well in regard to her wounds in general. She has been tolerating the dressing changes without complication. Overall I'm pleased with the progress that seems to be made week  by week especially in regard to the sacral ulcer. Her daughter and the caregiver seem to take very good care of her. With that being said she has very little undermining in regard to the sacral wound in good granulation she also has good epithelialization noted. 03/23/18 on evaluation today patient appears to be doing rather well in regard to her sacral wound. Unfortunately she does have a little bit of necrotic tissue noted in the central portion of her wound on the left elbow. This is due to the fact that she is lying on this arm seeing how it is contracted. Currently her daughter has started to avoid lying on the side it all due to the fact that the necrotic tissue was noted. With that being said they have been taking very good care of her in my pinion. Fortunately there does not appear to be any evidence of infection which is good news. Overall I'm pleased with the progress she's made  other than in regard to the elbow. 04/06/18 on evaluation today patient actually appears to be doing okay in regard to the sacral wound. In fact it appears to be somewhat smaller in general although there does appear to be more undermining at the 6 o'clock location than was previously noted. She has been tolerating the dressing changes without complication in general which is also good news. Nonetheless she does have a new small skin tear/opening on her leg which was evaluated today. Fortunately this appears to be minimal and the Xeroform gauze which has been used up to this point in the past has been utilized very effectively seems to be doing well in that regard. Her left elbow ulcer seems to also be doing excellent at this point making good progress. 04/20/18 on evaluation today patient appears to be doing fairly well in regard to her sacral wound. This definitely seems to be better than during last evaluation where it was indeed infected. With that being said she has been tolerating the dressing changes as best it  can be expected. Her elbow seems to be drying out and a lot of times the dressing is getting stuck according to her caregiver. [name], [name] ([id number]) 05/04/18-She is seen in follow-up evaluation for a sacral and left elbow pressure ulcer. These are stable/improved and we will continue with same treatment plan and she will follow-up in 2 weeks Electronic Signature(s) Signed: 05/04/2018 11:20:57 AM By: Bonnell Public Entered By: Bonnell Public on 05/04/2018 11:20:57 [name] ([id number]) -------------------------------------------------------------------------------- Physician Orders Details Patient Name: [name] Date of Service: 05/04/2018 10:15 AM Medical Record Number: [id number] Patient Account Number: [id number] Date of Birth/Sex: 1923-01-24 (84 y.o. F) Treating RN: Phillis Haggis Primary Care Provider: Vonita Moss Other Clinician: Referring Provider: Vonita Moss Treating Provider/Extender: Kathreen Cosier in Treatment: 32 Verbal / Phone Orders: Yes Clinician: Ashok Cordia, Debi Read Back and Verified: Yes Diagnosis Coding Wound Cleansing Wound #1 Midline Coccyx o Clean wound with Normal Saline. o May Shower, gently pat wound dry prior to applying new dressing. Wound #9 Left Elbow o Clean wound with Normal Saline. o May Shower, gently pat wound dry prior to applying new dressing. Anesthetic (add to Medication List) Wound #1 Midline Coccyx o Topical Lidocaine 4% cream applied to wound bed prior to debridement (In Clinic Only). Wound #9 Left Elbow o Topical Lidocaine 4% cream applied to wound bed prior to debridement (In Clinic Only). Skin Barriers/Peri-Wound Care Wound #1 Midline Coccyx o Skin Prep - use only where the allevyn sticks not on reddened areas o Antifungal powder-Nystatin - around peri-wound on coccyx (do not get in wound), under right breast and right arm, right abdomen Primary Wound Dressing Wound #1 Midline Coccyx o  Silver Collagen - prisma ag and pack into undermining as well slightly moisten with hydrogel or KY Jelly Wound #9 Left Elbow o Xeroform Secondary Dressing Wound #1 Midline Coccyx o Boardered Foam Dressing - HHRN PLEASE ORDER ALLEVYN LIFE BORDERED FOAM FOR PATIENT (OFF BRANDS IRRITATE HER SKIN) o Drawtex - HHRN to provide this for patient Wound #9 Left Elbow o Foam o Non-adherent pad o Other - stretch netting #4 Dressing Change Frequency Wound #1 Midline Coccyx Oscar, Myles Gip ([id number]) o Change dressing every day. Wound #9 Left Elbow o Change dressing every day. Follow-up Appointments o Return Appointment in 2 weeks. Off-Loading Wound #1 Midline Coccyx o Roho cushion for wheelchair o Mattress - fluidized air mattress o Turn and reposition every 2 hours Additional Orders /  Instructions Wound #1 Midline Coccyx o Increase protein intake. - please add protein supplements to patients diet o Other: - please add vitamin A, vitamin C and zinc supplements to patients diet Home Health Wound #1 Midline Coccyx o Continue Home Health Visits - [facility] o Home Health Nurse may visit PRN to address patientos wound care needs. o FACE TO FACE ENCOUNTER: MEDICARE and MEDICAID PATIENTS: I certify that this patient is under my care and that I had a face-to-face encounter that meets the physician face-to-face encounter requirements with this patient on this date. The encounter with the patient was in whole or in part for the following MEDICAL CONDITION: (primary reason for Home Healthcare) MEDICAL NECESSITY: I certify, that based on my findings, NURSING services are a medically necessary home health service. HOME BOUND STATUS: I certify that my clinical findings support that this patient is homebound (i.e., Due to illness or injury, pt requires aid of supportive devices such as crutches, cane, wheelchairs, walkers, the use of special transportation or  the assistance of another person to leave their place of residence. There is a normal inability to leave the home and doing so requires considerable and taxing effort. Other absences are for medical reasons / religious services and are infrequent or of short duration when for other reasons). o If current dressing causes regression in wound condition, may D/C ordered dressing product/s and apply Normal Saline Moist Dressing daily until next Wound Healing Center / Other MD appointment. Notify Wound Healing Center of regression in wound condition at [phone]. o Please direct any NON-WOUND related issues/requests for orders to patient's Primary Care Physician - Dr Vonita Moss Wound #9 Left Elbow o Continue Home Health Visits - [facility] o Home Health Nurse may visit PRN to address patientos wound care needs. o FACE TO FACE ENCOUNTER: MEDICARE and MEDICAID PATIENTS: I certify that this patient is under my care and that I had a face-to-face encounter that meets the physician face-to-face encounter requirements with this patient on this date. The encounter with the patient was in whole or in part for the following MEDICAL CONDITION: (primary reason for Home Healthcare) MEDICAL NECESSITY: I certify, that based on my findings, NURSING services are a medically necessary home health service. HOME BOUND STATUS: I certify that my clinical findings support that this patient is homebound (i.e., Due to illness or injury, pt requires aid of supportive devices such as crutches, cane, wheelchairs, walkers, the use of special transportation or the assistance of another person to leave their place of residence. There is a normal inability to leave the home and doing so requires considerable and taxing effort. Other absences are for medical reasons / religious services and are infrequent or of short duration when for other reasons). o If current dressing causes regression in wound condition, may D/C  ordered dressing product/s and apply Normal Saline Moist Dressing daily until next Wound Healing Center / Other MD appointment. Notify Wound Healing Center of regression in wound condition at [phone]. o Please direct any NON-WOUND related issues/requests for orders to patient's Primary Care Physician - Dr Vonita Moss TRYSTEN, BERTI ([id number]) Patient Medications Allergies: Phenergan, Sulfa (Sulfonamide Antibiotics) Notifications Medication Indication Start End lidocaine DOSE 1 - topical 4 % cream - 1 cream topical Electronic Signature(s) Signed: 05/04/2018 3:45:39 PM By: Bonnell Public Signed: 05/04/2018 3:52:40 PM By: Alejandro Mulling Entered By: Alejandro Mulling on 05/04/2018 11:05:00 [name] ([id number]) -------------------------------------------------------------------------------- Prescription 05/04/2018 Patient Name: [name] Provider: Bonnell Public NP Date of Birth:  Jun 19, 1923 NPI#: [id number] Sex: F DEA#: [id number] Phone #: [phone] License #: Patient Address: [facility] [address] [facility] [place], [place] [address] [address] [place], [place] [address] [phone] Allergies Phenergan Sulfa (Sulfonamide Antibiotics) Medication Medication: Route: Strength: Form: lidocaine topical 4% cream Class: TOPICAL LOCAL ANESTHETICS Dose: Frequency / Time: Indication: 1 1 cream topical Number of Refills: Number of Units: 0 Generic Substitution: Start Date: End Date: Administered at Substitution Permitted Facility: Yes Time Administered: Time Discontinued: Note to Pharmacy: Signature(s): Date(s): Electronic Signature(s) Signed: 05/04/2018 3:45:39 PM By: Bonnell Public Signed: 05/04/2018 3:52:40 PM By: Mateo Flow ([id number]) Entered By: Alejandro Mulling on 05/04/2018 11:05:01 [name]  ([id number]) --------------------------------------------------------------------------------  Progress Note Details Patient Name: [name] Date of Service: 05/04/2018 10:15 AM Medical Record Number: [id number] Patient Account Number: [id number] Date of Birth/Sex: 08-23-1923 (84 y.o. F) Treating RN: Phillis Haggis Primary Care Provider: Vonita Moss Other Clinician: Referring Provider: Vonita Moss Treating Provider/Extender: Kathreen Cosier in Treatment: 33 Subjective Chief Complaint Information obtained from Patient multiple wounds/ulcers History of Present Illness (HPI) 01/07/17 this is a 84 year old woman admitted to the clinic today for review of a pressure ulcer on her lower sacrum. She is referred from her primary physician's office after being seen on 3/22 with a 3 cm pressure area. Her daughter and caretaker accompanied her today state that the area first became obvious about a month ago and his since deteriorated. They have recently got [name] home health involved and have been using [name] to the wound. They have ordered a pressure relief surface for her mattress. They are turning her religiously. They state that she eats well and they've been forcing fluids on her. She is on a multivitamin. Looking through [facility] point last albumin I see was 4.4 on 10/28. The patient has advanced parkinsonism which looks superficially like advanced Parkinson's disease although her daughter tells me she did not ever respond to Sinemet therefore this may have another pathology with signs of parkinsonism. However I think this is largely a mute point currently. She also has dementia and is nonambulatory. Since this started they have been keeping her in bed and turning her religiously every 2 hours. She lives at home in [place] with her husband with 24/7 care giving 01/13/17 santyl change qd. Still will require further debridement. continue santyl. 01/20/17; patient's wound  actually looks some better less adherent necrotic surface. There is actually visible granulation. We're using [name] 01/27/17; better-looking surface but still a lot of necrotic tissue on the base of this wound. The periwound erythema is better than last week we are still using [name]. Her daughter tells [place] that she is still having trouble with the pressure-relief mattress through medical modalities 02/03/18; I had the patient scheduled for a two week followup however her daughter brought her in early concerned for discoloration on 2 areas of the wound circumference. We have bee using santyl 02/10/17;Better looking surface to the wound. Rim appears better suggesting better offloading. Using santyl 02/24/17; change to Silver [place] last time. Wound appears better. 03/10/17; still using silver collagen religious offloading. Intake is satisfactory per her daughter. Dimension slightly better 03/12/2017 -- Dr. Jannetta Quint patient who had been seen 2 days ago and was doing fairly well. The patient is brought in by her daughter who noticed a new wound just above the previous wound on her sacral area and going on more to the left lateral side. She  was very concerned and we asked her to get in for an opinion. 03/17/17; above is noted. The patient has developed a progressive area to the left of her original wound. This seems to this started with a ring of red skin with a more pale interior almost looking fungal. There was a rim of blister through part of the area although this did not look like zoster. They have been applying triamcinolone that was prescribed last week by Dr. Meyer Russel and the area has a fold to a linear band area which is confluent, erythematous and with obvious epidermal swelling but there is no overt tenderness or crepitus. She has lost some surface epithelium closer to the wound surface itself and now has a more superficial wound in this area and the extending erythema goes towards the left buttock.  This is well demarcated between involved in normal skin but once again does not appear to be at all tender. If there is a contact issue here I cannot get the history out of the daughter or the caregiver that are with her. 03/24/17; the patient arrives today with the wound slightly worse slightly more drainage. The bandlike area of erythema that I treated as a possible pineal infection has improved somewhat although proximally is still has confluent erythema without overt tenderness. We have been using silver alginate since the most recent deterioration. To the bandlike degree of erythema we have been using Lotrisone cream 03/31/17; patient arrives today with the wound slightly larger, necrotic surface and surrounding erythema. The bandlike area of erythema that I treated as a possible pineal infection is less swollen but still present I've been using Lotrisone cream on that largely related to the presence of a tinea looking infection when this was first seen. We've been using silver alginate. SHAMON, LOBO ([id number]) X-ray that I ordered last week showed no acute bony abnormalities mild fecal impaction. Lab work showed a comprehensive metabolic panel that was normal including an albumin of 3.8. White count was 9.5 hemoglobin 12.3 differential count normal. C-reactive protein was less than 1 and sedimentation rate and 17. The latter 2 values does not support an ongoing bacterial infection. 04/14/17; patient arrives after a 2 week hiatus. Her wound is not in good condition. Although the base of the wound looks stable she still has an erythematous area that was apparently blistered over the weekend. This again points to the left. As our intake nurse pointed out today this is in the area where the tissue folds together and we may need to prevent try to prevent this. Lab work and x-ray that I did to 3 weeks ago were unremarkable including her albumin nevertheless she is an extremely frail condition  physically. We have have been using [name] 04/22/17; I changed her to silver alginate because of the surrounding maceration and moisture last week. The daughter did not like the way the wound looked in the middle of the week and changed her back to [place]. They're putting gauze on top of this. Thinks still using Lotrisone. 05/06/17 on evaluation today patient sacral wound appears to be doing okay and does not seem to be any worse. She is having no significant pain during evaluation today the secondary to mental status she is unable to rate or describe whether she had any pain she was not however flinching. Her daughter states that the wound does appear to be looking better to her. Still we are having difficulty with the skinfold that seems to be closing in on itself  at this point. All in all I feel like she is making some good progress in the [name] seems to be the official for her. They do tell me that a refill if we are gonna continue that today. No fevers, chills, nausea, or vomiting noted at this time. 05/13/17 presents today for evaluation concerning her ongoing sacral pressure ulcer. Unfortunately she also has an area of deep tissue injury in the right Ischial region which is starting to show up. The sacral wound also continues to show signs of necrotic tissue overlying and has declined. Overall we really have not seen a significant improvement in the past several months in regard to the sacral wound and now patient is starting to develop a new wound in the right Ischial region. Obviously this is not trending in the direction that we want to see. No fevers, chills, nausea, or vomiting noted at this time. 05/19/17; I have not seen this wound and almost a month however there is nothing really positive to say about it. Necrotic tissue over the surface which superiorly I think abuts on her sacrum. She has surrounding erythema. I would be surprised if there is not underlying osteomyelitis or soft tissue  infection. 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 She apparently eats well per description although I wonder about this looking at her. Lab work I did probably 4 weeks ago however was really quite normal including a serum albumin 05/26/17; culture I did last week grew Escherichia coli and methicillin sensitive staph aureus which should've been well covered by the Augmentin and ciprofloxacin. Indeed the erythema around the wound in the bed of the wound looks somewhat better. Her intake is still satisfactory. They now have a near fluidized bed 06/02/17; they completed the antibiotics last Friday. Using collagen. Daughter still reports eating and drinking well. There is less visualized erythema around the wound. 06/16/17; large pressure ulcer over her lower sacrum and coccyx. Using [name] to the wound bed. 06/30/17; certainly no change in dimensions of this large stage III wound over her sacrum and coccyx. They've been using [name]. There is no exposed bone. She has a candidal/tinea area in the right inguinal area. Other than that her daughter relates that she is eating and drinking well there changing her positioning to make sure the areas offloaded 07/14/17; no major change in the dimensions of this large stage III wound. Initially a smaller wound that became secondarily infected causing significant tissue breakdown although it is been stable in the last several weeks. Tunneling superiorly at roughly 1:00 no change here either. There is no bone palpable. Both the patient's daughter and caretaker states that she eats well. I have not rechecked her blood work 07/27/17; patient arrives in clinic today and generally a deteriorated looking state. Mild fever with axillary temperature of 100.4. Daughter reports she has not been eating and drinking well since yesterday. She looks more pale and thin and less responsive. We have been using silver collagen to her wound 08/11/17; since the last time  the patient was here things have gone better. Her fever went down and she started eating and drinking again. Culture I did of the wound showed methicillin sensitive staph aureus, Morganella and enterococcus. I only treated her with Keflex which would've not covered the Morganella and enterococcus however the purulent area on the 2:00 side of her wound is a lot better and the bandlike erythema that concern me also was resolved. I'd called the daughter last week to confirm that  she was a lot better. She finished the Keflex last Thursday she also suffered a skin tear this morning perhaps while putting on her incontinence brief period is on the lateral aspect of her right leg. Clean wound with the surface epithelium not viable. 08/25/17; last week the patient was noted to have erythema around the wound margin and a slight fever which the patient's daughter says was 80. Our office was contacted by home health however we did not have a space to work the patient in that she went to see her primary physician Dr. Maurice March. She was not febrile during this visit on 08/21/17 there was erythema around the wound similar to last occasion. Dr. Maurice March in reference to my culture from 07/28/17 and put her on doxycycline capsules which they're opening twice a day for 10 days. [name], [name] ([id number]) 09/17/17; patient arrives today with the wound bed looking fairly well granulated. There is undermining from 4 to 6:00 although this seems to of contracted slightly. She does not have obvious infection although the daughter states there was some darkening of the wound circumference that is not evident today. They state she is eating well. They are concerned about oral thrush 09/30/16; very fibrin looking granulated wound bed. Her undermining from 4 to 6:00 is about the same but also appears to be well granulated. She has rolled edges of senescent tissue from roughly 7 to 12:00. There is no evidence of infection 10/13/17 on  evaluation today patient appears to be doing fairly well all things considered in regard to her sacral wound. There's really not a lot of significant change or improvement she does have some evidence of contusion and deep tissue injury around the left border of the wound that patient's daughter did inquire about today. Nonetheless overall the wound appears to be doing about the same in my opinion. There is no significant indication of infection there also is no significant slough noted at this point. 10/27/17 She is here in follow up evaluation of a sacral ulcer. She is accompanied by her daughter and caregiver. There is red granulation tissue throughout, persistent discoloration to left border; this appears consistent with deep tissue injury. There are multiple areas covered in foam borders, tegaderm, etc that are "preventative" with "no wounds". We will continue with prisma and continue with two week follow ups 11/17/17 on evaluation today patient appears to be doing decently well in regard to her wound at this point. She continues to have a sacral wound ulcer. We see her roughly every two weeks. In the last week she was actually in the hospital due to what was diagnosed as sepsis although no organisms were ever identified in the actual blood cultures. The hospital really was not sure that the wound was the cause of the infection but they really did not figure out anything else that would be a causative organism she did have a CT scan and that revealed no evidence of pneumonia there was also no evidence of urinary tract infection. Again it very well could have been the wound called in those although wound appears to be doing fairly well at this point. 12/15/17 on evaluation today patient actually appears to be doing about the same in regard to her sacral ulcer. The one thing different is that she does seem to have a rash where her right arm is contracted and being held to the thorax. The  area underlying both on the ventral side of the arm as well is the thorax where  it comes in contact shows evidence of a rash which appears to be fungal in nature. There does not appear to be any evidence of infection lies at this point. There does not appear to be a rash consistent with shingles which was also of concern initially. No fevers, chills, nausea, or vomiting noted at this time. 12/29/17 on evaluation today patient appears to be doing a little better in regard to her sacral wound although the one changes the 12 o'clock location of the wound seems to have attached at one point which no longer allows this to pull back. This has caused an area of undermining that seems to be attaching as well in the 12 o'clock location. I do think that this is something that can be managed and is not necessarily a bad thing. Nonetheless she does seem to have some pain with exploration of this region of undermining. She continues to have an area under her right arm on the chest wall of erythema although this is a little better especially after the home health nurse is actually got in order for Diflucan times one for the patient. She is a new area on the left wrist that looks like because of the contraction her nail may have pushed in on her wrist area causing a slight cut which subsequently became infected. She has some honey crusted drainage noted. 03/09/18 undervaluation today patient appears to be doing fairly well in regard to her wounds in general. She has been tolerating the dressing changes without complication. Overall I'm pleased with the progress that seems to be made week by week especially in regard to the sacral ulcer. Her daughter and the caregiver seem to take very good care of her. With that being said she has very little undermining in regard to the sacral wound in good granulation she also has good epithelialization noted. 03/23/18 on evaluation today patient appears to be doing rather well in  regard to her sacral wound. Unfortunately she does have a little bit of necrotic tissue noted in the central portion of her wound on the left elbow. This is due to the fact that she is lying on this arm seeing how it is contracted. Currently her daughter has started to avoid lying on the side it all due to the fact that the necrotic tissue was noted. With that being said they have been taking very good care of her in my pinion. Fortunately there does not appear to be any evidence of infection which is good news. Overall I'm pleased with the progress she's made other than in regard to the elbow. 04/06/18 on evaluation today patient actually appears to be doing okay in regard to the sacral wound. In fact it appears to be somewhat smaller in general although there does appear to be more undermining at the 6 o'clock location than was previously noted. She has been tolerating the dressing changes without complication in general which is also good news. Nonetheless she does have a new small skin tear/opening on her leg which was evaluated today. Fortunately this appears to be minimal and the Xeroform gauze which has been used up to this point in the past has been utilized very effectively seems [name], [name]. ([id number]) to be doing well in that regard. Her left elbow ulcer seems to also be doing excellent at this point making good progress. 04/20/18 on evaluation today patient appears to be doing fairly well in regard to her sacral wound. This definitely seems to be better than during  last evaluation where it was indeed infected. With that being said she has been tolerating the dressing changes as best it can be expected. Her elbow seems to be drying out and a lot of times the dressing is getting stuck according to her caregiver. 05/04/18-She is seen in follow-up evaluation for a sacral and left elbow pressure ulcer. These are stable/improved and we will continue with same treatment plan and she will  follow-up in 2 weeks Objective Constitutional Vitals Time Taken: 10:34 AM, Height: 60 in, Weight: 90 lbs, BMI: 17.6, Temperature: 97.6 F, Pulse: 54 bpm, Respiratory Rate: 14 breaths/min, Blood Pressure: 121/40 mmHg. Integumentary (Hair, Skin) Wound #1 status is Open. Original cause of wound was Pressure Injury. The wound is located on the Midline Coccyx. The wound measures 3.5cm length x 0.9cm width x 0.6cm depth; 2.474cm^2 area and 1.484cm^3 volume. There is muscle and Fat Layer (Subcutaneous Tissue) Exposed exposed. There is no tunneling noted, however, there is undermining starting at 6:00 and ending at 11:00 with a maximum distance of 1.5cm. There is a large amount of serosanguineous drainage noted. The wound margin is flat and intact. There is large (67-100%) red, hyper - granulation within the wound bed. There is no necrotic tissue within the wound bed. The periwound skin appearance exhibited: Induration, Rash, Maceration, Erythema. The periwound skin appearance did not exhibit: Callus, Crepitus, Excoriation, Scarring, Dry/Scaly, Atrophie Blanche, Cyanosis, Ecchymosis, Hemosiderin Staining, Mottled, Pallor, Rubor. The surrounding wound skin color is noted with erythema which is circumferential. Periwound temperature was noted as No Abnormality. The periwound has tenderness on palpation. Wound #11 status is Healed - Epithelialized. Original cause of wound was Trauma. The wound is located on the Right Upper Leg. The wound measures 0cm length x 0cm width x 0cm depth; 0cm^2 area and 0cm^3 volume. There is no tunneling or undermining noted. There is a none present amount of drainage noted. The wound margin is flat and intact. There is no granulation within the wound bed. There is no necrotic tissue within the wound bed. The periwound skin appearance did not exhibit: Callus, Crepitus, Excoriation, Induration, Rash, Scarring, Dry/Scaly, Maceration, Atrophie Blanche, Cyanosis, Ecchymosis,  Hemosiderin Staining, Mottled, Pallor, Rubor, Erythema. Periwound temperature was noted as No Abnormality. Wound #9 status is Open. Original cause of wound was Trauma. The wound is located on the Left Elbow. The wound measures 1.9cm length x 1.2cm width x 0.1cm depth; 1.791cm^2 area and 0.179cm^3 volume. There is no tunneling or undermining noted. There is a small amount of serous drainage noted. The wound margin is distinct with the outline attached to the wound base. There is large (67-100%) red, pink granulation within the wound bed. There is a small (1-33%) amount of necrotic tissue within the wound bed. The periwound skin appearance exhibited: Dry/Scaly, Erythema. The surrounding wound skin color is noted with erythema which is circumferential. Periwound temperature was noted as No Abnormality. The periwound has tenderness on palpation. Procedures Wound #1 Pre-procedure diagnosis of Wound #1 is a Pressure Ulcer located on the Midline Coccyx . There was a Excisional [name], [name]. ([id number]) Skin/Subcutaneous Tissue Debridement with a total area of 3.15 sq cm performed by Bonnell Public, NP. With the following instrument(s): Curette to remove Viable and Non-Viable tissue/material. Material removed includes Callus, Subcutaneous Tissue, Slough, and Fibrin/Exudate after achieving pain control using Lidocaine 4% Topical Solution. No specimens were taken. A time out was conducted at 11:01, prior to the start of the procedure. A Minimum amount of bleeding was controlled with Pressure. The  procedure was tolerated well with a pain level of 0 throughout and a pain level of 0 following the procedure. Patient s Level of Consciousness post procedure was recorded as Awake and Alert. Post Debridement Measurements: 3.5cm length x 0.9cm width x 0.7cm depth; 1.732cm^3 volume. Post debridement Stage noted as Category/Stage IV. Character of Wound/Ulcer Post Debridement requires further debridement. Post  procedure Diagnosis Wound #1: Same as Pre-Procedure Plan Wound Cleansing: Wound #1 Midline Coccyx: Clean wound with Normal Saline. May Shower, gently pat wound dry prior to applying new dressing. Wound #9 Left Elbow: Clean wound with Normal Saline. May Shower, gently pat wound dry prior to applying new dressing. Anesthetic (add to Medication List): Wound #1 Midline Coccyx: Topical Lidocaine 4% cream applied to wound bed prior to debridement (In Clinic Only). Wound #9 Left Elbow: Topical Lidocaine 4% cream applied to wound bed prior to debridement (In Clinic Only). Skin Barriers/Peri-Wound Care: Wound #1 Midline Coccyx: Skin Prep - use only where the allevyn sticks not on reddened areas Antifungal powder-Nystatin - around peri-wound on coccyx (do not get in wound), under right breast and right arm, right abdomen Primary Wound Dressing: Wound #1 Midline Coccyx: Silver Collagen - prisma ag and pack into undermining as well slightly moisten with hydrogel or KY Jelly Wound #9 Left Elbow: Xeroform Secondary Dressing: Wound #1 Midline Coccyx: Boardered Foam Dressing - HHRN PLEASE ORDER ALLEVYN LIFE BORDERED FOAM FOR PATIENT (OFF BRANDS IRRITATE HER SKIN) Drawtex - HHRN to provide this for patient Wound #9 Left Elbow: Foam Non-adherent pad Other - stretch netting #4 Dressing Change Frequency: Wound #1 Midline Coccyx: Change dressing every day. Wound #9 Left Elbow: Change dressing every day. Follow-up Appointments: Return Appointment in 2 weeks. Off-Loading: Wound #1 Midline Coccyx: Roho cushion for wheelchair Tina MainlandSHAW, Kayson W. ([id number]) Mattress - fluidized air mattress Turn and reposition every 2 hours Additional Orders / Instructions: Wound #1 Midline Coccyx: Increase protein intake. - please add protein supplements to patients diet Other: - please add vitamin A, vitamin C and zinc supplements to patients diet Home Health: Wound #1 Midline Coccyx: Continue Home Health  Visits - [facility] Nurse may visit PRN to address patient s wound care needs. FACE TO FACE ENCOUNTER: MEDICARE and MEDICAID PATIENTS: I certify that this patient is under my care and that I had a face-to-face encounter that meets the physician face-to-face encounter requirements with this patient on this date. The encounter with the patient was in whole or in part for the following MEDICAL CONDITION: (primary reason for Home Healthcare) MEDICAL NECESSITY: I certify, that based on my findings, NURSING services are a medically necessary home health service. HOME BOUND STATUS: I certify that my clinical findings support that this patient is homebound (i.e., Due to illness or injury, pt requires aid of supportive devices such as crutches, cane, wheelchairs, walkers, the use of special transportation or the assistance of another person to leave their place of residence. There is a normal inability to leave the home and doing so requires considerable and taxing effort. Other absences are for medical reasons / religious services and are infrequent or of short duration when for other reasons). If current dressing causes regression in wound condition, may D/C ordered dressing product/s and apply Normal Saline Moist Dressing daily until next Wound Healing Center / Other MD appointment. Notify Wound Healing Center of regression in wound condition at [phone]. Please direct any NON-WOUND related issues/requests for orders to patient's Primary Care Physician - Dr Vonita MossMark Crissman Wound #9 Left  Elbow: Continue Home Health Visits - [facility] Nurse may visit PRN to address patient s wound care needs. FACE TO FACE ENCOUNTER: MEDICARE and MEDICAID PATIENTS: I certify that this patient is under my care and that I had a face-to-face encounter that meets the physician face-to-face encounter requirements with this patient on this date. The encounter with the patient was in whole or in part for  the following MEDICAL CONDITION: (primary reason for Home Healthcare) MEDICAL NECESSITY: I certify, that based on my findings, NURSING services are a medically necessary home health service. HOME BOUND STATUS: I certify that my clinical findings support that this patient is homebound (i.e., Due to illness or injury, pt requires aid of supportive devices such as crutches, cane, wheelchairs, walkers, the use of special transportation or the assistance of another person to leave their place of residence. There is a normal inability to leave the home and doing so requires considerable and taxing effort. Other absences are for medical reasons / religious services and are infrequent or of short duration when for other reasons). If current dressing causes regression in wound condition, may D/C ordered dressing product/s and apply Normal Saline Moist Dressing daily until next Wound Healing Center / Other MD appointment. Notify Wound Healing Center of regression in wound condition at [phone]. Please direct any NON-WOUND related issues/requests for orders to patient's Primary Care Physician - Dr Vonita Moss The following medication(s) was prescribed: lidocaine topical 4 % cream 1 1 cream topical was prescribed at facility Electronic Signature(s) Signed: 05/04/2018 11:21:11 AM By: Bonnell Public Entered By: Bonnell Public on 05/04/2018 11:21:10 [name] ([id number]) -------------------------------------------------------------------------------- SuperBill Details Patient Name: [name] Date of Service: 05/04/2018 Medical Record Number: [id number] Patient Account Number: [id number] Date of Birth/Sex: 1922/10/25 (84 y.o. F) Treating RN: Phillis Haggis Primary Care Provider: Vonita Moss Other Clinician: Referring Provider: Vonita Moss Treating Provider/Extender: Kathreen Cosier in Treatment: 68 Diagnosis Coding ICD-10 Codes Code Description L89.153 Pressure ulcer of sacral  region, stage 3 L89.023 Pressure ulcer of left elbow, stage 3 G20 Parkinson's disease L03.312 Cellulitis of back [any part except buttock] S81.811D Laceration without foreign body, right lower leg, subsequent encounter Facility Procedures CPT4 Code: [id number] Description: 11042 - DEB SUBQ TISSUE 20 SQ CM/< ICD-10 Diagnosis Description L89.153 Pressure ulcer of sacral region, stage 3 Modifier: Quantity: 1 Physician Procedures CPT4 Code: [id number] Description: 11042 - WC PHYS SUBQ TISS 20 SQ CM ICD-10 Diagnosis Description L89.153 Pressure ulcer of sacral region, stage 3 Modifier: Quantity: 1 Electronic Signature(s) Signed: 05/04/2018 11:21:23 AM By: Bonnell Public Entered By: Bonnell Public on 05/04/2018 11:21:23

## 2018-05-17 NOTE — Telephone Encounter (Signed)
Called and left a detailed message on [name]'s voicemail letting her know that if she needs to be seen right away then she will need to go to the ER, if not she can go to her wound care appt tomorrow.

## 2018-05-17 NOTE — Telephone Encounter (Addendum)
Sunny SchleinFelicia is calling and the wound has gotten  Worse . Please return her call [phone]

## 2018-05-17 NOTE — Telephone Encounter (Signed)
It looks like she has a wound care appt for tomorrow - if needing to be seen sooner, should go to ER

## 2018-05-18 ENCOUNTER — Encounter: Payer: Medicare Other | Admitting: Physician Assistant

## 2018-05-18 DIAGNOSIS — E11622 Type 2 diabetes mellitus with other skin ulcer: Secondary | ICD-10-CM | POA: Diagnosis not present

## 2018-05-28 NOTE — Progress Notes (Signed)
[name] ([id number]) Visit Report for 05/18/2018 Chief Complaint Document Details Patient Name: [name] Date of Service: 05/18/2018 10:15 AM Medical Record Number: [id number] Patient Account Number: [id number] Date of Birth/Sex: 03/13/1923 (84 y.o. F) Treating RN: Phillis Haggis Primary Care Provider: Vonita Moss Other Clinician: Referring Provider: Vonita Moss Treating Provider/Extender: Linwood Dibbles, HOYT Weeks in Treatment: 64 Information Obtained from: Patient Chief Complaint multiple wounds/ulcers Electronic Signature(s) Signed: 05/19/2018 12:11:53 PM By: Lenda Kelp PA-C Entered By: Lenda Kelp on 05/18/2018 11:00:02 [name] ([id number]) -------------------------------------------------------------------------------- HPI Details Patient Name: [name] Date of Service: 05/18/2018 10:15 AM Medical Record Number: [id number] Patient Account Number: [id number] Date of Birth/Sex: 04-25-1923 (84 y.o. F) Treating RN: Phillis Haggis Primary Care Provider: Vonita Moss Other Clinician: Referring Provider: Vonita Moss Treating Provider/Extender: Linwood Dibbles, HOYT Weeks in Treatment: 35 History of Present Illness HPI Description: 01/07/17 this is a 84 year old woman admitted to the clinic today for review of a pressure ulcer on her lower sacrum. She is referred from her primary physician's office after being seen on 3/22 with a 3 cm pressure area. Her daughter and caretaker accompanied her today state that the area first became obvious about a month ago and his since deteriorated. They have recently got [name] a home health involved and have been using [name] to the wound. They have ordered a pressure relief surface for her mattress. They are turning her religiously. They state that she eats well and they've been forcing fluids on her. She is on a multivitamin. Looking through [facility] point last albumin I see was 4.4 on 10/28. The patient  has advanced parkinsonism which looks superficially like advanced Parkinson's disease although her daughter tells me she did not ever respond to Sinemet therefore this may have another pathology with signs of parkinsonism. However I think this is largely a mute point currently. She also has dementia and is nonambulatory. Since this started they have been keeping her in bed and turning her religiously every 2 hours. She lives at home in [place] with her husband with 24/7 care giving 01/13/17 santyl change qd. Still will require further debridement. continue santyl. 01/20/17; patient's wound actually looks some better less adherent necrotic surface. There is actually visible granulation. We're using [name] 01/27/17; better-looking surface but still a lot of necrotic tissue on the base of this wound. The periwound erythema is better than last week we are still using [name]. Her daughter tells [place] that she is still having trouble with the pressure-relief mattress through medical modalities 02/03/18; I had the patient scheduled for a two week followup however her daughter brought her in early concerned for discoloration on 2 areas of the wound circumference. We have bee using santyl 02/10/17;Better looking surface to the wound. Rim appears better suggesting better offloading. Using santyl 02/24/17; change to Silver Collegen last time. Wound appears better. 03/10/17; still using silver collagen religious offloading. Intake is satisfactory per her daughter. Dimension slightly better 03/12/2017 -- Dr. Jannetta Quint patient who had been seen 2 days ago and was doing fairly well. The patient is brought in by her daughter who noticed a new wound just above the previous wound on her sacral area and going on more to the left lateral side. She was very concerned and we asked her to get in for an opinion. 03/17/17; above is noted. The patient has developed a progressive area to the left of her original wound. This seems to  this started with a ring of red skin  with a more pale interior almost looking fungal. There was a rim of blister through part of the area although this did not look like zoster. They have been applying triamcinolone that was prescribed last week by Dr. Meyer Russel and the area has a fold to a linear band area which is confluent, erythematous and with obvious epidermal swelling but there is no overt tenderness or crepitus. She has lost some surface epithelium closer to the wound surface itself and now has a more superficial wound in this area and the extending erythema goes towards the left buttock. This is well demarcated between involved in normal skin but once again does not appear to be at all tender. If there is a contact issue here I cannot get the history out of the daughter or the caregiver that are with her. 03/24/17; the patient arrives today with the wound slightly worse slightly more drainage. The bandlike area of erythema that I treated as a possible pineal infection has improved somewhat although proximally is still has confluent erythema without overt tenderness. We have been using silver alginate since the most recent deterioration. To the bandlike degree of erythema we have been using Lotrisone cream 03/31/17; patient arrives today with the wound slightly larger, necrotic surface and surrounding erythema. The bandlike area of erythema that I treated as a possible pineal infection is less swollen but still present I've been using Lotrisone cream on that largely related to the presence of a tinea looking infection when this was first seen. We've been using silver alginate. X-ray that I ordered last week showed no acute bony abnormalities mild fecal impaction. Lab work showed a comprehensive metabolic panel that was normal including an albumin of 3.8. White count was 9.5 hemoglobin 12.3 differential count normal. C-reactive protein was less than 1 and sedimentation rate and 17. The latter 2  values does not support an ongoing bacterial infection. 04/14/17; patient arrives after a 2 week hiatus. Her wound is not in good condition. Although the base of the wound looks FREDONIA, CASALINO. ([id number]) stable she still has an erythematous area that was apparently blistered over the weekend. This again points to the left. As our intake nurse pointed out today this is in the area where the tissue folds together and we may need to prevent try to prevent this. Lab work and x-ray that I did to 3 weeks ago were unremarkable including her albumin nevertheless she is an extremely frail condition physically. We have have been using [name] 04/22/17; I changed her to silver alginate because of the surrounding maceration and moisture last week. The daughter did not like the way the wound looked in the middle of the week and changed her back to [place]. They're putting gauze on top of this. Thinks still using Lotrisone. 05/06/17 on evaluation today patient sacral wound appears to be doing okay and does not seem to be any worse. She is having no significant pain during evaluation today the secondary to mental status she is unable to rate or describe whether she had any pain she was not however flinching. Her daughter states that the wound does appear to be looking better to her. Still we are having difficulty with the skinfold that seems to be closing in on itself at this point. All in all I feel like she is making some good progress in the [name] seems to be the official for her. They do tell me that a refill if we are gonna continue that today. No fevers, chills, nausea,  or vomiting noted at this time. 05/13/17 presents today for evaluation concerning her ongoing sacral pressure ulcer. Unfortunately she also has an area of deep tissue injury in the right Ischial region which is starting to show up. The sacral wound also continues to show signs of necrotic tissue overlying and has declined. Overall we really  have not seen a significant improvement in the past several months in regard to the sacral wound and now patient is starting to develop a new wound in the right Ischial region. Obviously this is not trending in the direction that we want to see. No fevers, chills, nausea, or vomiting noted at this time. 05/19/17; I have not seen this wound and almost a month however there is nothing really positive to say about it. Necrotic tissue over the surface which superiorly I think abuts on her sacrum. She has surrounding erythema. I would be surprised if there is not underlying osteomyelitis or soft tissue infection. 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 She apparently eats well per description although I wonder about this looking at her. Lab work I did probably 4 weeks ago however was really quite normal including a serum albumin 05/26/17; culture I did last week grew Escherichia coli and methicillin sensitive staph aureus which should've been well covered by the Augmentin and ciprofloxacin. Indeed the erythema around the wound in the bed of the wound looks somewhat better. Her intake is still satisfactory. They now have a near fluidized bed 06/02/17; they completed the antibiotics last Friday. Using collagen. Daughter still reports eating and drinking well. There is less visualized erythema around the wound. 06/16/17; large pressure ulcer over her lower sacrum and coccyx. Using [name] to the wound bed. 06/30/17; certainly no change in dimensions of this large stage III wound over her sacrum and coccyx. They've been using [name]. There is no exposed bone. She has a candidal/tinea area in the right inguinal area. Other than that her daughter relates that she is eating and drinking well there changing her positioning to make sure the areas offloaded 07/14/17; no major change in the dimensions of this large stage III wound. Initially a smaller wound that became secondarily infected causing  significant tissue breakdown although it is been stable in the last several weeks. Tunneling superiorly at roughly 1:00 no change here either. There is no bone palpable. Both the patient's daughter and caretaker states that she eats well. I have not rechecked her blood work 07/27/17; patient arrives in clinic today and generally a deteriorated looking state. Mild fever with axillary temperature of 100.4. Daughter reports she has not been eating and drinking well since yesterday. She looks more pale and thin and less responsive. We have been using silver collagen to her wound 08/11/17; since the last time the patient was here things have gone better. Her fever went down and she started eating and drinking again. Culture I did of the wound showed methicillin sensitive staph aureus, Morganella and enterococcus. I only treated her with Keflex which would've not covered the Morganella and enterococcus however the purulent area on the 2:00 side of her wound is a lot better and the bandlike erythema that concern me also was resolved. I'd called the daughter last week to confirm that she was a lot better. She finished the Keflex last Thursday she also suffered a skin tear this morning perhaps while putting on her incontinence brief period is on the lateral aspect of her right leg. Clean wound with the surface epithelium not  viable. 08/25/17; last week the patient was noted to have erythema around the wound margin and a slight fever which the patient's daughter says was [id number]. Our office was contacted by home health however we did not have a space to work the patient in that she went to see her primary physician Dr. Maurice MarchLane. She was not febrile during this visit on 08/21/17 there was erythema around the wound similar to last occasion. Dr. Maurice MarchLane in reference to my culture from 07/28/17 and put her on doxycycline capsules which they're opening twice a day for 10 days. 09/17/17; patient arrives today with the wound  bed looking fairly well granulated. There is undermining from 4 to 6:00 although this seems to of contracted slightly. She does not have obvious infection although the daughter states there was some darkening of the wound circumference that is not evident today. They state she is eating well. They are concerned about oral thrush 09/30/16; very fibrin looking granulated wound bed. Her undermining from 4 to 6:00 is about the same but also appears to be well granulated. She has rolled edges of senescent tissue from roughly 7 to 12:00. There is no evidence of infection Tina MainlandSHAW, Bahja W. ([id number]) 10/13/17 on evaluation today patient appears to be doing fairly well all things considered in regard to her sacral wound. There's really not a lot of significant change or improvement she does have some evidence of contusion and deep tissue injury around the left border of the wound that patient's daughter did inquire about today. Nonetheless overall the wound appears to be doing about the same in my opinion. There is no significant indication of infection there also is no significant slough noted at this point. 10/27/17 She is here in follow up evaluation of a sacral ulcer. She is accompanied by her daughter and caregiver. There is red granulation tissue throughout, persistent discoloration to left border; this appears consistent with deep tissue injury. There are multiple areas covered in foam borders, tegaderm, etc that are "preventative" with "no wounds". We will continue with prisma and continue with two week follow ups 11/17/17 on evaluation today patient appears to be doing decently well in regard to her wound at this point. She continues to have a sacral wound ulcer. We see her roughly every two weeks. In the last week she was actually in the hospital due to what was diagnosed as sepsis although no organisms were ever identified in the actual blood cultures. The hospital really was not sure that the  wound was the cause of the infection but they really did not figure out anything else that would be a causative organism she did have a CT scan and that revealed no evidence of pneumonia there was also no evidence of urinary tract infection. Again it very well could have been the wound called in those although wound appears to be doing fairly well at this point. 12/15/17 on evaluation today patient actually appears to be doing about the same in regard to her sacral ulcer. The one thing different is that she does seem to have a rash where her right arm is contracted and being held to the thorax. The area underlying both on the ventral side of the arm as well is the thorax where it comes in contact shows evidence of a rash which appears to be fungal in nature. There does not appear to be any evidence of infection lies at this point. There does not appear to be a rash consistent with shingles which was  also of concern initially. No fevers, chills, nausea, or vomiting noted at this time. 12/29/17 on evaluation today patient appears to be doing a little better in regard to her sacral wound although the one changes the 12 o'clock location of the wound seems to have attached at one point which no longer allows this to pull back. This has caused an area of undermining that seems to be attaching as well in the 12 o'clock location. I do think that this is something that can be managed and is not necessarily a bad thing. Nonetheless she does seem to have some pain with exploration of this region of undermining. She continues to have an area under her right arm on the chest wall of erythema although this is a little better especially after the home health nurse is actually got in order for Diflucan times one for the patient. She is a new area on the left wrist that looks like because of the contraction her nail may have pushed in on her wrist area causing a slight cut which subsequently became infected. She has  some honey crusted drainage noted. 03/09/18 undervaluation today patient appears to be doing fairly well in regard to her wounds in general. She has been tolerating the dressing changes without complication. Overall I'm pleased with the progress that seems to be made week by week especially in regard to the sacral ulcer. Her daughter and the caregiver seem to take very good care of her. With that being said she has very little undermining in regard to the sacral wound in good granulation she also has good epithelialization noted. 03/23/18 on evaluation today patient appears to be doing rather well in regard to her sacral wound. Unfortunately she does have a little bit of necrotic tissue noted in the central portion of her wound on the left elbow. This is due to the fact that she is lying on this arm seeing how it is contracted. Currently her daughter has started to avoid lying on the side it all due to the fact that the necrotic tissue was noted. With that being said they have been taking very good care of her in my pinion. Fortunately there does not appear to be any evidence of infection which is good news. Overall I'm pleased with the progress she's made other than in regard to the elbow. 04/06/18 on evaluation today patient actually appears to be doing okay in regard to the sacral wound. In fact it appears to be somewhat smaller in general although there does appear to be more undermining at the 6 o'clock location than was previously noted. She has been tolerating the dressing changes without complication in general which is also good news. Nonetheless she does have a new small skin tear/opening on her leg which was evaluated today. Fortunately this appears to be minimal and the Xeroform gauze which has been used up to this point in the past has been utilized very effectively seems to be doing well in that regard. Her left elbow ulcer seems to also be doing excellent at this point making good  progress. 04/20/18 on evaluation today patient appears to be doing fairly well in regard to her sacral wound. This definitely seems to be better than during last evaluation where it was indeed infected. With that being said she has been tolerating the dressing changes as best it can be expected. Her elbow seems to be drying out and a lot of times the dressing is getting stuck according to her caregiver. [name],  [name] ([id number]) 05/04/18-She is seen in follow-up evaluation for a sacral and left elbow pressure ulcer. These are stable/improved and we will continue with same treatment plan and she will follow-up in 2 weeks 05/18/18 on evaluation today patient actually appears to be doing better in regard to her left elbow ulcer. The sacral wound is also shown signs of improvement which is good news. With that being said she does have a new right medial lower extremity ulcer which actually appears to show some signs of cellulitis/infection. She is previously taken Augmentin with good result. Nonetheless the patient really does not have anything that I can culture at the site there's actually some good epithelialization noted although again there is some cellulitis appearance as well. Electronic Signature(s) Signed: 05/19/2018 12:11:53 PM By: Lenda Kelp PA-C Entered By: Lenda Kelp on 05/19/2018 11:05:52 [name] ([id number]) -------------------------------------------------------------------------------- Physical Exam Details Patient Name: [name] Date of Service: 05/18/2018 10:15 AM Medical Record Number: [id number] Patient Account Number: [id number] Date of Birth/Sex: 19-May-1923 (84 y.o. F) Treating RN: Phillis Haggis Primary Care Provider: Vonita Moss Other Clinician: Referring Provider: Vonita Moss Treating Provider/Extender: STONE III, HOYT Weeks in Treatment: 16 Constitutional Well-nourished and well-hydrated in no acute distress. Respiratory normal  breathing without difficulty. clear to auscultation bilaterally. Cardiovascular regular rate and rhythm with normal S1, S2. Psychiatric Patient is not able to cooperate in decision making regarding care. Patient has dementia. patient is confused. Notes Patient's wounds all showed signs of good granulation no sharp debridement was required in general. He still has some undermining in regard to the six to 9 o'clock location in regard to the sacral wound although the wound itself is actually significantly smaller compared to previous. Again there does appear to be some cellulitis surrounding the new right medial lower extremity ulcer. Electronic Signature(s) Signed: 05/19/2018 12:11:53 PM By: Lenda Kelp PA-C Entered By: Lenda Kelp on 05/19/2018 11:06:32 [name] ([id number]) -------------------------------------------------------------------------------- Physician Orders Details Patient Name: [name] Date of Service: 05/18/2018 10:15 AM Medical Record Number: [id number] Patient Account Number: [id number] Date of Birth/Sex: 28-Mar-1923 (84 y.o. F) Treating RN: Phillis Haggis Primary Care Provider: Vonita Moss Other Clinician: Referring Provider: Vonita Moss Treating Provider/Extender: Linwood Dibbles, HOYT Weeks in Treatment: 47 Verbal / Phone Orders: Yes Clinician: Ashok Cordia, Debi Read Back and Verified: Yes Diagnosis Coding ICD-10 Coding Code Description L89.153 Pressure ulcer of sacral region, stage 3 L89.023 Pressure ulcer of left elbow, stage 3 G20 Parkinson's disease L03.312 Cellulitis of back [any part except buttock] S81.811D Laceration without foreign body, right lower leg, subsequent encounter Wound Cleansing Wound #1 Midline Coccyx o Clean wound with Normal Saline. o May Shower, gently pat wound dry prior to applying new dressing. Wound #12 Right Upper Leg o Clean wound with Normal Saline. o May Shower, gently pat wound dry prior to  applying new dressing. Wound #9 Left Elbow o Clean wound with Normal Saline. o May Shower, gently pat wound dry prior to applying new dressing. Anesthetic (add to Medication List) Wound #1 Midline Coccyx o Topical Lidocaine 4% cream applied to wound bed prior to debridement (In Clinic Only). Wound #12 Right Upper Leg o Topical Lidocaine 4% cream applied to wound bed prior to debridement (In Clinic Only). Wound #9 Left Elbow o Topical Lidocaine 4% cream applied to wound bed prior to debridement (In Clinic Only). Skin Barriers/Peri-Wound Care Wound #1 Midline Coccyx o Skin Prep - use only where the allevyn sticks not on reddened areas o  Antifungal powder-Nystatin - around peri-wound on coccyx (do not get in wound), under right breast and right arm, right abdomen Primary Wound Dressing Wound #1 Midline Coccyx o Silver Alginate - silvercel Ag non-adherent o Silver Collagen - prisma ag and pack into undermining as well slightly moisten with hydrogel or KY Jelly and then place silvercel Ag non-adherent on top of the silver collagen [name], [name]. ([id number]) Wound #12 Right Upper Leg o Xeroform Wound #9 Left Elbow o Xeroform Secondary Dressing Wound #1 Midline Coccyx o Boardered Foam Dressing - HHRN PLEASE ORDER ALLEVYN LIFE BORDERED FOAM FOR PATIENT (OFF BRANDS IRRITATE HER SKIN) Wound #12 Right Upper Leg o Foam o Non-adherent pad Wound #9 Left Elbow o Foam o Non-adherent pad o Other - stretch netting #4 Dressing Change Frequency Wound #1 Midline Coccyx o Change dressing every day. Wound #12 Right Upper Leg o Change dressing every day. Wound #9 Left Elbow o Change dressing every day. Follow-up Appointments o Return Appointment in 2 weeks. Off-Loading Wound #1 Midline Coccyx o Roho cushion for wheelchair o Mattress - fluidized air mattress o Turn and reposition every 2 hours Additional Orders / Instructions Wound #1  Midline Coccyx o Increase protein intake. - please add protein supplements to patients diet o Other: - please add vitamin A, vitamin C and zinc supplements to patients diet Wound #12 Right Upper Leg o Increase protein intake. - please add protein supplements to patients diet o Other: - please add vitamin A, vitamin C and zinc supplements to patients diet Wound #9 Left Elbow o Increase protein intake. - please add protein supplements to patients diet o Other: - please add vitamin A, vitamin C and zinc supplements to patients diet Home Health Wound #1 Midline Coccyx LADYE, MACNAUGHTON ([id number]) o Continue Home Health Visits - [facility] o Home Health Nurse may visit PRN to address patientos wound care needs. o FACE TO FACE ENCOUNTER: MEDICARE and MEDICAID PATIENTS: I certify that this patient is under my care and that I had a face-to-face encounter that meets the physician face-to-face encounter requirements with this patient on this date. The encounter with the patient was in whole or in part for the following MEDICAL CONDITION: (primary reason for Home Healthcare) MEDICAL NECESSITY: I certify, that based on my findings, NURSING services are a medically necessary home health service. HOME BOUND STATUS: I certify that my clinical findings support that this patient is homebound (i.e., Due to illness or injury, pt requires aid of supportive devices such as crutches, cane, wheelchairs, walkers, the use of special transportation or the assistance of another person to leave their place of residence. There is a normal inability to leave the home and doing so requires considerable and taxing effort. Other absences are for medical reasons / religious services and are infrequent or of short duration when for other reasons). o If current dressing causes regression in wound condition, may D/C ordered dressing product/s and apply Normal Saline Moist Dressing daily until next Wound  Healing Center / Other MD appointment. Notify Wound Healing Center of regression in wound condition at [phone]. o Please direct any NON-WOUND related issues/requests for orders to patient's Primary Care Physician - Dr Vonita Moss Wound #12 Right Upper Leg o Continue Home Health Visits - [facility] o Home Health Nurse may visit PRN to address patientos wound care needs. o FACE TO FACE ENCOUNTER: MEDICARE and MEDICAID PATIENTS: I certify that this patient is under my care and that I had a face-to-face encounter that meets the  physician face-to-face encounter requirements with this patient on this date. The encounter with the patient was in whole or in part for the following MEDICAL CONDITION: (primary reason for Home Healthcare) MEDICAL NECESSITY: I certify, that based on my findings, NURSING services are a medically necessary home health service. HOME BOUND STATUS: I certify that my clinical findings support that this patient is homebound (i.e., Due to illness or injury, pt requires aid of supportive devices such as crutches, cane, wheelchairs, walkers, the use of special transportation or the assistance of another person to leave their place of residence. There is a normal inability to leave the home and doing so requires considerable and taxing effort. Other absences are for medical reasons / religious services and are infrequent or of short duration when for other reasons). o If current dressing causes regression in wound condition, may D/C ordered dressing product/s and apply Normal Saline Moist Dressing daily until next Wound Healing Center / Other MD appointment. Notify Wound Healing Center of regression in wound condition at [phone]. o Please direct any NON-WOUND related issues/requests for orders to patient's Primary Care Physician - Dr Vonita Moss Wound #9 Left Elbow o Continue Home Health Visits - [facility] o Home Health Nurse may visit PRN to address  patientos wound care needs. o FACE TO FACE ENCOUNTER: MEDICARE and MEDICAID PATIENTS: I certify that this patient is under my care and that I had a face-to-face encounter that meets the physician face-to-face encounter requirements with this patient on this date. The encounter with the patient was in whole or in part for the following MEDICAL CONDITION: (primary reason for Home Healthcare) MEDICAL NECESSITY: I certify, that based on my findings, NURSING services are a medically necessary home health service. HOME BOUND STATUS: I certify that my clinical findings support that this patient is homebound (i.e., Due to illness or injury, pt requires aid of supportive devices such as crutches, cane, wheelchairs, walkers, the use of special transportation or the assistance of another person to leave their place of residence. There is a normal inability to leave the home and doing so requires considerable and taxing effort. Other absences are for medical reasons / religious services and are infrequent or of short duration when for other reasons). o If current dressing causes regression in wound condition, may D/C ordered dressing product/s and apply Normal Saline Moist Dressing daily until next Wound Healing Center / Other MD appointment. Notify Wound Healing Center of regression in wound condition at [phone]. o Please direct any NON-WOUND related issues/requests for orders to patient's Primary Care Physician - Dr Vonita Moss Patient Medications TAIRA, KNABE ([id number]) Allergies: Phenergan, Sulfa (Sulfonamide Antibiotics) Notifications Medication Indication Start End lidocaine DOSE 1 - topical 4 % cream - 1 cream topical Augmentin 05/18/2018 DOSE 1 - oral 500 mg-125 mg tablet - 1 tablet oral take 2 times a day for 10 days Electronic Signature(s) Signed: 05/18/2018 11:22:51 AM By: Lenda Kelp PA-C Entered By: Lenda Kelp on 05/18/2018 11:22:50 [name]  ([id number]) -------------------------------------------------------------------------------- Prescription 05/18/2018 Patient Name: [name] Provider: Lenda Kelp PA-C Date of Birth: 11/09/1922 NPI#: [id number] Sex: F DEA#: [id number] Phone #: [phone] License #: Patient Address: [facility] [address] [facility] [place], [place] [address] [address] [place], [place] [address] [phone] Allergies Phenergan Sulfa (Sulfonamide Antibiotics) Medication Medication: Route: Strength: Form: lidocaine 4 % topical cream topical 4% cream Class: TOPICAL LOCAL ANESTHETICS Dose: Frequency / Time: Indication:  1 1 cream topical Number of Refills: Number of Units: 0 Generic Substitution: Start Date: End Date: One Time Use: Substitution Permitted No Note to Pharmacy: Signature(s): Date(s): Electronic Signature(s) Signed: 05/19/2018 12:11:53 PM By: Lenda Kelp PA-C Entered By: Lenda Kelp on 05/18/2018 11:22:58 [name] ([id number]) --------------------------------------------------------------------------------  Problem List Details Patient Name: [name] Date of Service: 05/18/2018 10:15 AM Medical Record Number: [id number] Patient Account Number: [id number] Date of Birth/Sex: 1923/07/07 (84 y.o. F) Treating RN: Phillis Haggis Primary Care Provider: Vonita Moss Other Clinician: Referring Provider: Vonita Moss Treating Provider/Extender: Linwood Dibbles, HOYT Weeks in Treatment: 75 Active Problems ICD-10 Evaluated Encounter Code Description Active Date Today Diagnosis L89.153 Pressure ulcer of sacral region, stage 3 01/07/2017 No Yes L89.023 Pressure ulcer of left elbow, stage 3 03/23/2018 No Yes G20 Parkinson's disease 01/07/2017 No Yes L03.312 Cellulitis of back [any part except buttock] 07/28/2017 No Yes S81.811D Laceration without foreign body, right lower leg,  subsequent 08/11/2017 No Yes encounter Inactive Problems Resolved Problems Electronic Signature(s) Signed: 05/19/2018 12:11:53 PM By: Lenda Kelp PA-C Entered By: Lenda Kelp on 05/18/2018 10:59:56 [name] ([id number]) -------------------------------------------------------------------------------- Progress Note Details Patient Name: [name] Date of Service: 05/18/2018 10:15 AM Medical Record Number: [id number] Patient Account Number: [id number] Date of Birth/Sex: [birth year] (84 y.o. F) Treating RN: Phillis Haggis Primary Care Provider: Vonita Moss Other Clinician: Referring Provider: Vonita Moss Treating Provider/Extender: Linwood Dibbles, HOYT Weeks in Treatment: 70 Subjective Chief Complaint Information obtained from Patient multiple wounds/ulcers History of Present Illness (HPI) 01/07/17 this is a 84 year old woman admitted to the clinic today for review of a pressure ulcer on her lower sacrum. She is referred from her primary physician's office after being seen on 3/22 with a 3 cm pressure area. Her daughter and caretaker accompanied her today state that the area first became obvious about a month ago and his since deteriorated. They have recently got [name] home health involved and have been using [name] to the wound. They have ordered a pressure relief surface for her mattress. They are turning her religiously. They state that she eats well and they've been forcing fluids on her. She is on a multivitamin. Looking through [facility] point last albumin I see was 4.4 on 10/28. The patient has advanced parkinsonism which looks superficially like advanced Parkinson's disease although her daughter tells me she did not ever respond to Sinemet therefore this may have another pathology with signs of parkinsonism. However I think this is largely a mute point currently. She also has dementia and is nonambulatory. Since this started they have been keeping her in  bed and turning her religiously every 2 hours. She lives at home in [place] with her husband with 24/7 care giving 01/13/17 santyl change qd. Still will require further debridement. continue santyl. 01/20/17; patient's wound actually looks some better less adherent necrotic surface. There is actually visible granulation. We're using [name] 01/27/17; better-looking surface but still a lot of necrotic tissue on the base of this wound. The periwound erythema is better than last week we are still using [name]. Her daughter tells [place] that she is still having trouble with the pressure-relief mattress through medical modalities 02/03/18; I had the patient scheduled for a two week followup however her daughter brought her in early concerned for discoloration on 2 areas of the wound circumference. We have bee using santyl 02/10/17;Better looking surface to the wound. Rim appears better suggesting better offloading. Using santyl 02/24/17; change to Silver Collegen  last time. Wound appears better. 03/10/17; still using silver collagen religious offloading. Intake is satisfactory per her daughter. Dimension slightly better 03/12/2017 -- Dr. Jannetta Quint patient who had been seen 2 days ago and was doing fairly well. The patient is brought in by her daughter who noticed a new wound just above the previous wound on her sacral area and going on more to the left lateral side. She was very concerned and we asked her to get in for an opinion. 03/17/17; above is noted. The patient has developed a progressive area to the left of her original wound. This seems to this started with a ring of red skin with a more pale interior almost looking fungal. There was a rim of blister through part of the area although this did not look like zoster. They have been applying triamcinolone that was prescribed last week by Dr. Meyer Russel and the area has a fold to a linear band area which is confluent, erythematous and with obvious epidermal  swelling but there is no overt tenderness or crepitus. She has lost some surface epithelium closer to the wound surface itself and now has a more superficial wound in this area and the extending erythema goes towards the left buttock. This is well demarcated between involved in normal skin but once again does not appear to be at all tender. If there is a contact issue here I cannot get the history out of the daughter or the caregiver that are with her. 03/24/17; the patient arrives today with the wound slightly worse slightly more drainage. The bandlike area of erythema that I treated as a possible pineal infection has improved somewhat although proximally is still has confluent erythema without overt tenderness. We have been using silver alginate since the most recent deterioration. To the bandlike degree of erythema we have been using Lotrisone cream 03/31/17; patient arrives today with the wound slightly larger, necrotic surface and surrounding erythema. The bandlike area of erythema that I treated as a possible pineal infection is less swollen but still present I've been using Lotrisone cream on that largely related to the presence of a tinea looking infection when this was first seen. We've been using silver alginate. [name], [name] ([id number]) X-ray that I ordered last week showed no acute bony abnormalities mild fecal impaction. Lab work showed a comprehensive metabolic panel that was normal including an albumin of 3.8. White count was 9.5 hemoglobin 12.3 differential count normal. C-reactive protein was less than 1 and sedimentation rate and 17. The latter 2 values does not support an ongoing bacterial infection. 04/14/17; patient arrives after a 2 week hiatus. Her wound is not in good condition. Although the base of the wound looks stable she still has an erythematous area that was apparently blistered over the weekend. This again points to the left. As our intake nurse pointed out today  this is in the area where the tissue folds together and we may need to prevent try to prevent this. Lab work and x-ray that I did to 3 weeks ago were unremarkable including her albumin nevertheless she is an extremely frail condition physically. We have have been using [name] 04/22/17; I changed her to silver alginate because of the surrounding maceration and moisture last week. The daughter did not like the way the wound looked in the middle of the week and changed her back to [place]. They're putting gauze on top of this. Thinks still using Lotrisone. 05/06/17 on evaluation today patient sacral wound appears to be doing  okay and does not seem to be any worse. She is having no significant pain during evaluation today the secondary to mental status she is unable to rate or describe whether she had any pain she was not however flinching. Her daughter states that the wound does appear to be looking better to her. Still we are having difficulty with the skinfold that seems to be closing in on itself at this point. All in all I feel like she is making some good progress in the [name] seems to be the official for her. They do tell me that a refill if we are gonna continue that today. No fevers, chills, nausea, or vomiting noted at this time. 05/13/17 presents today for evaluation concerning her ongoing sacral pressure ulcer. Unfortunately she also has an area of deep tissue injury in the right Ischial region which is starting to show up. The sacral wound also continues to show signs of necrotic tissue overlying and has declined. Overall we really have not seen a significant improvement in the past several months in regard to the sacral wound and now patient is starting to develop a new wound in the right Ischial region. Obviously this is not trending in the direction that we want to see. No fevers, chills, nausea, or vomiting noted at this time. 05/19/17; I have not seen this wound and almost a month however  there is nothing really positive to say about it. Necrotic tissue over the surface which superiorly I think abuts on her sacrum. She has surrounding erythema. I would be surprised if there is not underlying osteomyelitis or soft tissue infection. 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 She apparently eats well per description although I wonder about this looking at her. Lab work I did probably 4 weeks ago however was really quite normal including a serum albumin 05/26/17; culture I did last week grew Escherichia coli and methicillin sensitive staph aureus which should've been well covered by the Augmentin and ciprofloxacin. Indeed the erythema around the wound in the bed of the wound looks somewhat better. Her intake is still satisfactory. They now have a near fluidized bed 06/02/17; they completed the antibiotics last Friday. Using collagen. Daughter still reports eating and drinking well. There is less visualized erythema around the wound. 06/16/17; large pressure ulcer over her lower sacrum and coccyx. Using [name] to the wound bed. 06/30/17; certainly no change in dimensions of this large stage III wound over her sacrum and coccyx. They've been using [name]. There is no exposed bone. She has a candidal/tinea area in the right inguinal area. Other than that her daughter relates that she is eating and drinking well there changing her positioning to make sure the areas offloaded 07/14/17; no major change in the dimensions of this large stage III wound. Initially a smaller wound that became secondarily infected causing significant tissue breakdown although it is been stable in the last several weeks. Tunneling superiorly at roughly 1:00 no change here either. There is no bone palpable. Both the patient's daughter and caretaker states that she eats well. I have not rechecked her blood work 07/27/17; patient arrives in clinic today and generally a deteriorated looking state. Mild fever with  axillary temperature of 100.4. Daughter reports she has not been eating and drinking well since yesterday. She looks more pale and thin and less responsive. We have been using silver collagen to her wound 08/11/17; since the last time the patient was here things have gone better. Her fever went  down and she started eating and drinking again. Culture I did of the wound showed methicillin sensitive staph aureus, Morganella and enterococcus. I only treated her with Keflex which would've not covered the Morganella and enterococcus however the purulent area on the 2:00 side of her wound is a lot better and the bandlike erythema that concern me also was resolved. I'd called the daughter last week to confirm that she was a lot better. She finished the Keflex last Thursday she also suffered a skin tear this morning perhaps while putting on her incontinence brief period is on the lateral aspect of her right leg. Clean wound with the surface epithelium not viable. 08/25/17; last week the patient was noted to have erythema around the wound margin and a slight fever which the patient's daughter says was 38. Our office was contacted by home health however we did not have a space to work the patient in that she went to see her primary physician Dr. Maurice March. She was not febrile during this visit on 08/21/17 there was erythema around the wound similar to last occasion. Dr. Maurice March in reference to my culture from 07/28/17 and put her on doxycycline capsules which they're opening twice a day for 10 days. [name], [name] ([id number]) 09/17/17; patient arrives today with the wound bed looking fairly well granulated. There is undermining from 4 to 6:00 although this seems to of contracted slightly. She does not have obvious infection although the daughter states there was some darkening of the wound circumference that is not evident today. They state she is eating well. They are concerned about oral thrush 09/30/16; very  fibrin looking granulated wound bed. Her undermining from 4 to 6:00 is about the same but also appears to be well granulated. She has rolled edges of senescent tissue from roughly 7 to 12:00. There is no evidence of infection 10/13/17 on evaluation today patient appears to be doing fairly well all things considered in regard to her sacral wound. There's really not a lot of significant change or improvement she does have some evidence of contusion and deep tissue injury around the left border of the wound that patient's daughter did inquire about today. Nonetheless overall the wound appears to be doing about the same in my opinion. There is no significant indication of infection there also is no significant slough noted at this point. 10/27/17 She is here in follow up evaluation of a sacral ulcer. She is accompanied by her daughter and caregiver. There is red granulation tissue throughout, persistent discoloration to left border; this appears consistent with deep tissue injury. There are multiple areas covered in foam borders, tegaderm, etc that are "preventative" with "no wounds". We will continue with prisma and continue with two week follow ups 11/17/17 on evaluation today patient appears to be doing decently well in regard to her wound at this point. She continues to have a sacral wound ulcer. We see her roughly every two weeks. In the last week she was actually in the hospital due to what was diagnosed as sepsis although no organisms were ever identified in the actual blood cultures. The hospital really was not sure that the wound was the cause of the infection but they really did not figure out anything else that would be a causative organism she did have a CT scan and that revealed no evidence of pneumonia there was also no evidence of urinary tract infection. Again it very well could have been the wound called in those although wound appears  to be doing fairly well at this point. 12/15/17 on  evaluation today patient actually appears to be doing about the same in regard to her sacral ulcer. The one thing different is that she does seem to have a rash where her right arm is contracted and being held to the thorax. The area underlying both on the ventral side of the arm as well is the thorax where it comes in contact shows evidence of a rash which appears to be fungal in nature. There does not appear to be any evidence of infection lies at this point. There does not appear to be a rash consistent with shingles which was also of concern initially. No fevers, chills, nausea, or vomiting noted at this time. 12/29/17 on evaluation today patient appears to be doing a little better in regard to her sacral wound although the one changes the 12 o'clock location of the wound seems to have attached at one point which no longer allows this to pull back. This has caused an area of undermining that seems to be attaching as well in the 12 o'clock location. I do think that this is something that can be managed and is not necessarily a bad thing. Nonetheless she does seem to have some pain with exploration of this region of undermining. She continues to have an area under her right arm on the chest wall of erythema although this is a little better especially after the home health nurse is actually got in order for Diflucan times one for the patient. She is a new area on the left wrist that looks like because of the contraction her nail may have pushed in on her wrist area causing a slight cut which subsequently became infected. She has some honey crusted drainage noted. 03/09/18 undervaluation today patient appears to be doing fairly well in regard to her wounds in general. She has been tolerating the dressing changes without complication. Overall I'm pleased with the progress that seems to be made week by week especially in regard to the sacral ulcer. Her daughter and the caregiver seem to take very good  care of her. With that being said she has very little undermining in regard to the sacral wound in good granulation she also has good epithelialization noted. 03/23/18 on evaluation today patient appears to be doing rather well in regard to her sacral wound. Unfortunately she does have a little bit of necrotic tissue noted in the central portion of her wound on the left elbow. This is due to the fact that she is lying on this arm seeing how it is contracted. Currently her daughter has started to avoid lying on the side it all due to the fact that the necrotic tissue was noted. With that being said they have been taking very good care of her in my pinion. Fortunately there does not appear to be any evidence of infection which is good news. Overall I'm pleased with the progress she's made other than in regard to the elbow. 04/06/18 on evaluation today patient actually appears to be doing okay in regard to the sacral wound. In fact it appears to be somewhat smaller in general although there does appear to be more undermining at the 6 o'clock location than was previously noted. She has been tolerating the dressing changes without complication in general which is also good news. Nonetheless she does have a new small skin tear/opening on her leg which was evaluated today. Fortunately this appears to be minimal and the Xeroform  gauze which has been used up to this point in the past has been utilized very effectively seems [name], [name]. ([id number]) to be doing well in that regard. Her left elbow ulcer seems to also be doing excellent at this point making good progress. 04/20/18 on evaluation today patient appears to be doing fairly well in regard to her sacral wound. This definitely seems to be better than during last evaluation where it was indeed infected. With that being said she has been tolerating the dressing changes as best it can be expected. Her elbow seems to be drying out and a lot of times the  dressing is getting stuck according to her caregiver. 05/04/18-She is seen in follow-up evaluation for a sacral and left elbow pressure ulcer. These are stable/improved and we will continue with same treatment plan and she will follow-up in 2 weeks 05/18/18 on evaluation today patient actually appears to be doing better in regard to her left elbow ulcer. The sacral wound is also shown signs of improvement which is good news. With that being said she does have a new right medial lower extremity ulcer which actually appears to show some signs of cellulitis/infection. She is previously taken Augmentin with good result. Nonetheless the patient really does not have anything that I can culture at the site there's actually some good epithelialization noted although again there is some cellulitis appearance as well. Patient History Unable to Obtain Patient History due to Altered Mental Status. Information obtained from Patient. Social History Never smoker, Marital Status - Married, Alcohol Use - Never, Drug Use - No History, Caffeine Use - Never. Medical And Surgical History Notes Ear/Nose/Mouth/Throat dysphagia Neurologic parkinsons, tremor Review of Systems (ROS) Constitutional Symptoms (General Health) Denies complaints or symptoms of Fever, Chills. Respiratory The patient has no complaints or symptoms. Cardiovascular The patient has no complaints or symptoms. Psychiatric The patient has no complaints or symptoms. Objective Constitutional Well-nourished and well-hydrated in no acute distress. Vitals Time Taken: 10:38 AM, Height: 60 in, Weight: 90 lbs, BMI: 17.6, Temperature: 98.1 F, Pulse: 112 bpm, Respiratory Rate: 14 breaths/min, Blood Pressure: 153/50 mmHg. Respiratory normal breathing without difficulty. clear to auscultation bilaterally. [name], [name] ([id number]) Cardiovascular regular rate and rhythm with normal S1, S2. Psychiatric Patient is not able to cooperate in  decision making regarding care. Patient has dementia. patient is confused. General Notes: Patient's wounds all showed signs of good granulation no sharp debridement was required in general. He still has some undermining in regard to the six to 9 o'clock location in regard to the sacral wound although the wound itself is actually significantly smaller compared to previous. Again there does appear to be some cellulitis surrounding the new right medial lower extremity ulcer. Integumentary (Hair, Skin) Wound #1 status is Open. Original cause of wound was Pressure Injury. The wound is located on the Midline Coccyx. The wound measures 2.8cm length x 0.9cm width x 0.5cm depth; 1.979cm^2 area and 0.99cm^3 volume. There is muscle and Fat Layer (Subcutaneous Tissue) Exposed exposed. There is no tunneling noted, however, there is undermining starting at 6:00 and ending at 9:00 with a maximum distance of 2.2cm. There is a large amount of serosanguineous drainage noted. The wound margin is flat and intact. There is large (67-100%) red, hyper - granulation within the wound bed. There is no necrotic tissue within the wound bed. The periwound skin appearance exhibited: Induration, Rash, Maceration, Erythema. The periwound skin appearance did not exhibit: Callus, Crepitus, Excoriation, Scarring, Dry/Scaly, Atrophie Blanche, Cyanosis, Ecchymosis,  Hemosiderin Staining, Mottled, Pallor, Rubor. The surrounding wound skin color is noted with erythema which is circumferential. Periwound temperature was noted as No Abnormality. The periwound has tenderness on palpation. Wound #12 status is Open. Original cause of wound was Trauma. The wound is located on the Right Upper Leg. The wound measures 2cm length x 2.5cm width x 0.1cm depth; 3.927cm^2 area and 0.393cm^3 volume. There is no tunneling or undermining noted. There is a large amount of serous drainage noted. The wound margin is flat and intact. There is large  (67- 100%) red granulation within the wound bed. There is no necrotic tissue within the wound bed. The periwound skin appearance exhibited: Erythema. The periwound skin appearance did not exhibit: Callus, Crepitus, Excoriation, Induration, Rash, Scarring, Dry/Scaly, Maceration, Atrophie Blanche, Cyanosis, Ecchymosis, Hemosiderin Staining, Mottled, Pallor, Rubor. The surrounding wound skin color is noted with erythema which is circumferential. Periwound temperature was noted as No Abnormality. Wound #9 status is Open. Original cause of wound was Trauma. The wound is located on the Left Elbow. The wound measures 1.2cm length x 0.6cm width x 0.1cm depth; 0.565cm^2 area and 0.057cm^3 volume. There is no tunneling or undermining noted. There is a small amount of serous drainage noted. The wound margin is distinct with the outline attached to the wound base. There is large (67-100%) red, pink granulation within the wound bed. There is a small (1-33%) amount of necrotic tissue within the wound bed. The periwound skin appearance exhibited: Dry/Scaly, Erythema. The surrounding wound skin color is noted with erythema which is circumferential. Periwound temperature was noted as No Abnormality. The periwound has tenderness on palpation. Assessment Active Problems ICD-10 Pressure ulcer of sacral region, stage 3 Pressure ulcer of left elbow, stage 3 Parkinson's disease Cellulitis of back [any part except buttock] Laceration without foreign body, right lower leg, subsequent encounter [name], [name] ([id number]) Plan Wound Cleansing: Wound #1 Midline Coccyx: Clean wound with Normal Saline. May Shower, gently pat wound dry prior to applying new dressing. Wound #12 Right Upper Leg: Clean wound with Normal Saline. May Shower, gently pat wound dry prior to applying new dressing. Wound #9 Left Elbow: Clean wound with Normal Saline. May Shower, gently pat wound dry prior to applying new  dressing. Anesthetic (add to Medication List): Wound #1 Midline Coccyx: Topical Lidocaine 4% cream applied to wound bed prior to debridement (In Clinic Only). Wound #12 Right Upper Leg: Topical Lidocaine 4% cream applied to wound bed prior to debridement (In Clinic Only). Wound #9 Left Elbow: Topical Lidocaine 4% cream applied to wound bed prior to debridement (In Clinic Only). Skin Barriers/Peri-Wound Care: Wound #1 Midline Coccyx: Skin Prep - use only where the allevyn sticks not on reddened areas Antifungal powder-Nystatin - around peri-wound on coccyx (do not get in wound), under right breast and right arm, right abdomen Primary Wound Dressing: Wound #1 Midline Coccyx: Silver Alginate - silvercel Ag non-adherent Silver Collagen - prisma ag and pack into undermining as well slightly moisten with hydrogel or KY Jelly and then place silvercel Ag non-adherent on top of the silver collagen Wound #12 Right Upper Leg: Xeroform Wound #9 Left Elbow: Xeroform Secondary Dressing: Wound #1 Midline Coccyx: Boardered Foam Dressing - HHRN PLEASE ORDER ALLEVYN LIFE BORDERED FOAM FOR PATIENT (OFF BRANDS IRRITATE HER SKIN) Wound #12 Right Upper Leg: Foam Non-adherent pad Wound #9 Left Elbow: Foam Non-adherent pad Other - stretch netting #4 Dressing Change Frequency: Wound #1 Midline Coccyx: Change dressing every day. Wound #12 Right Upper Leg: Change dressing  every day. Wound #9 Left Elbow: Change dressing every day. Follow-up Appointments: Return Appointment in 2 weeks. Off-Loading: Wound #1 Midline Coccyx: Roho cushion for wheelchair Mattress - fluidized air mattress Turn and reposition every 2 hours [name], BADDERS. ([id number]) Additional Orders / Instructions: Wound #1 Midline Coccyx: Increase protein intake. - please add protein supplements to patients diet Other: - please add vitamin A, vitamin C and zinc supplements to patients diet Wound #12 Right Upper Leg: Increase  protein intake. - please add protein supplements to patients diet Other: - please add vitamin A, vitamin C and zinc supplements to patients diet Wound #9 Left Elbow: Increase protein intake. - please add protein supplements to patients diet Other: - please add vitamin A, vitamin C and zinc supplements to patients diet Home Health: Wound #1 Midline Coccyx: Continue Home Health Visits - [facility] Nurse may visit PRN to address patient s wound care needs. FACE TO FACE ENCOUNTER: MEDICARE and MEDICAID PATIENTS: I certify that this patient is under my care and that I had a face-to-face encounter that meets the physician face-to-face encounter requirements with this patient on this date. The encounter with the patient was in whole or in part for the following MEDICAL CONDITION: (primary reason for Home Healthcare) MEDICAL NECESSITY: I certify, that based on my findings, NURSING services are a medically necessary home health service. HOME BOUND STATUS: I certify that my clinical findings support that this patient is homebound (i.e., Due to illness or injury, pt requires aid of supportive devices such as crutches, cane, wheelchairs, walkers, the use of special transportation or the assistance of another person to leave their place of residence. There is a normal inability to leave the home and doing so requires considerable and taxing effort. Other absences are for medical reasons / religious services and are infrequent or of short duration when for other reasons). If current dressing causes regression in wound condition, may D/C ordered dressing product/s and apply Normal Saline Moist Dressing daily until next Wound Healing Center / Other MD appointment. Notify Wound Healing Center of regression in wound condition at [phone]. Please direct any NON-WOUND related issues/requests for orders to patient's Primary Care Physician - Dr Vonita Moss Wound #12 Right Upper Leg: Continue Home  Health Visits - [facility] Nurse may visit PRN to address patient s wound care needs. FACE TO FACE ENCOUNTER: MEDICARE and MEDICAID PATIENTS: I certify that this patient is under my care and that I had a face-to-face encounter that meets the physician face-to-face encounter requirements with this patient on this date. The encounter with the patient was in whole or in part for the following MEDICAL CONDITION: (primary reason for Home Healthcare) MEDICAL NECESSITY: I certify, that based on my findings, NURSING services are a medically necessary home health service. HOME BOUND STATUS: I certify that my clinical findings support that this patient is homebound (i.e., Due to illness or injury, pt requires aid of supportive devices such as crutches, cane, wheelchairs, walkers, the use of special transportation or the assistance of another person to leave their place of residence. There is a normal inability to leave the home and doing so requires considerable and taxing effort. Other absences are for medical reasons / religious services and are infrequent or of short duration when for other reasons). If current dressing causes regression in wound condition, may D/C ordered dressing product/s and apply Normal Saline Moist Dressing daily until next Wound Healing Center / Other MD appointment. Notify Wound Healing Center  of regression in wound condition at [phone]. Please direct any NON-WOUND related issues/requests for orders to patient's Primary Care Physician - Dr Vonita Moss Wound #9 Left Elbow: Continue Home Health Visits - [facility] Nurse may visit PRN to address patient s wound care needs. FACE TO FACE ENCOUNTER: MEDICARE and MEDICAID PATIENTS: I certify that this patient is under my care and that I had a face-to-face encounter that meets the physician face-to-face encounter requirements with this patient on this date. The encounter with the patient was in whole or in  part for the following MEDICAL CONDITION: (primary reason for Home Healthcare) MEDICAL NECESSITY: I certify, that based on my findings, NURSING services are a medically necessary home health service. HOME BOUND STATUS: I certify that my clinical findings support that this patient is homebound (i.e., Due to illness or injury, pt requires aid of supportive devices such as crutches, cane, wheelchairs, walkers, the use of special transportation or the assistance of another person to leave their place of residence. There is a normal inability to leave the home and doing so requires considerable and taxing effort. Other absences are for medical reasons / religious services and are infrequent or of short duration when for other reasons). If current dressing causes regression in wound condition, may D/C ordered dressing product/s and apply Normal Saline Moist Dressing daily until next Wound Healing Center / Other MD appointment. Notify Wound Healing Center of regression in wound condition at [phone]. Please direct any NON-WOUND related issues/requests for orders to patient's Primary Care Physician - Dr Vonita Moss [name], [name]. ([id number]) The following medication(s) was prescribed: lidocaine topical 4 % cream 1 1 cream topical was prescribed at facility Augmentin oral 500 mg-125 mg tablet 1 1 tablet oral take 2 times a day for 10 days starting 05/18/2018 My suggestion at this point is going to be that we continue with the above wound care orders. I am gonna go ahead and send in a prescription for the Augmentin for her that was accomplished today as well. We will see were things stand at follow-up. Please see above for specific wound care orders. We will see patient for re-evaluation in two week(s) here in the clinic. If anything worsens or changes patient will contact our office for additional recommendations. Electronic Signature(s) Signed: 05/19/2018 12:11:53 PM By: Lenda Kelp  PA-C Entered By: Lenda Kelp on 05/19/2018 11:06:44 [name] ([id number]) -------------------------------------------------------------------------------- ROS/PFSH Details Patient Name: [name] Date of Service: 05/18/2018 10:15 AM Medical Record Number: [id number] Patient Account Number: [id number] Date of Birth/Sex: 1923-02-04 (84 y.o. F) Treating RN: Phillis Haggis Primary Care Provider: Vonita Moss Other Clinician: Referring Provider: Vonita Moss Treating Provider/Extender: Linwood Dibbles, HOYT Weeks in Treatment: 32 Unable to Obtain Patient History due to oo Altered Mental Status Information Obtained From Patient Wound History Do you currently have one or more open woundso Yes How many open wounds do you currently haveo 1 Approximately how long have you had your woundso 1 week How have you been treating your wound(s) until nowo santyl and gauze Has your wound(s) ever healed and then re-openedo No Have you had any lab work done in the past montho No Have you tested positive for an antibiotic resistant organism (MRSA, VRE)o No Have you tested positive for osteomyelitis (bone infection)o No Have you had any tests for circulation on your legso No Constitutional Symptoms (General Health) Complaints and Symptoms: Negative for: Fever; Chills Eyes Medical History: Negative for: Cataracts; Glaucoma; Optic Neuritis Ear/Nose/Mouth/Throat  Medical History: Negative for: Chronic sinus problems/congestion; Middle ear problems Past Medical History Notes: dysphagia Hematologic/Lymphatic Medical History: Positive for: Anemia Negative for: Hemophilia; Human Immunodeficiency Virus; Lymphedema; Sickle Cell Disease Respiratory Complaints and Symptoms: No Complaints or Symptoms Medical History: Negative for: Aspiration; Asthma; Chronic Obstructive Pulmonary Disease (COPD); Pneumothorax; Sleep Apnea; Tuberculosis Cardiovascular [name], WOOLWORTH.  ([id number]) Complaints and Symptoms: No Complaints or Symptoms Medical History: Positive for: Hypertension Negative for: Angina; Arrhythmia; Congestive Heart Failure; Coronary Artery Disease; Deep Vein Thrombosis; Hypotension; Myocardial Infarction; Peripheral Arterial Disease; Peripheral Venous Disease; Phlebitis; Vasculitis Gastrointestinal Medical History: Negative for: Cirrhosis ; Colitis; Crohnos; Hepatitis A; Hepatitis B; Hepatitis C Endocrine Medical History: Negative for: Type I Diabetes; Type II Diabetes Genitourinary Medical History: Negative for: End Stage Renal Disease Immunological Medical History: Negative for: Lupus Erythematosus; Raynaudos; Scleroderma Integumentary (Skin) Medical History: Negative for: History of Burn; History of pressure wounds Musculoskeletal Medical History: Negative for: Gout; Rheumatoid Arthritis; Osteoarthritis; Osteomyelitis Neurologic Medical History: Positive for: Dementia Negative for: Neuropathy; Quadriplegia; Paraplegia; Seizure Disorder Past Medical History Notes: parkinsons, tremor Oncologic Medical History: Negative for: Received Chemotherapy; Received Radiation Psychiatric Complaints and Symptoms: No Complaints or Symptoms Medical History: Negative for: Anorexia/bulimia; Confinement Anxiety [name], [name] ([id number]) Immunizations Pneumococcal Vaccine: Received Pneumococcal Vaccination: Yes Immunization Notes: up to date Implantable Devices Family and Social History Never smoker; Marital Status - Married; Alcohol Use: Never; Drug Use: No History; Caffeine Use: Never; Financial Concerns: No; Food, Clothing or Shelter Needs: No; Support System Lacking: No; Transportation Concerns: No; Advanced Directives: Yes (Not Provided); Patient does not want information on Advanced Directives; Medical Power of Attorney: Yes - dtr (Not Provided) Physician Affirmation I have reviewed and agree with the above  information. Electronic Signature(s) Signed: 05/19/2018 12:11:53 PM By: Lenda Kelp PA-C Signed: 05/19/2018 4:43:18 PM By: Alejandro Mulling Entered By: Lenda Kelp on 05/19/2018 11:06:12 [name] ([id number]) -------------------------------------------------------------------------------- SuperBill Details Patient Name: [name] Date of Service: 05/18/2018 Medical Record Number: [id number] Patient Account Number: [id number] Date of Birth/Sex: [birth year] (84 y.o. F) Treating RN: Phillis Haggis Primary Care Provider: Vonita Moss Other Clinician: Referring Provider: Vonita Moss Treating Provider/Extender: Linwood Dibbles, HOYT Weeks in Treatment: 70 Diagnosis Coding ICD-10 Codes Code Description L89.153 Pressure ulcer of sacral region, stage 3 L89.023 Pressure ulcer of left elbow, stage 3 G20 Parkinson's disease L03.312 Cellulitis of back [any part except buttock] S81.811D Laceration without foreign body, right lower leg, subsequent encounter Facility Procedures CPT4 Code: [id number] Description: 99214 - WOUND CARE VISIT-LEV 4 EST PT Modifier: Quantity: 1 Physician Procedures CPT4 Code: 3244010 Description: 99213 - WC PHYS LEVEL 3 - EST PT ICD-10 Diagnosis Description L89.153 Pressure ulcer of sacral region, stage 3 L89.023 Pressure ulcer of left elbow, stage 3 G20 Parkinson's disease L03.312 Cellulitis of back [any part except buttock] Modifier: Quantity: 1 Electronic Signature(s) Signed: 05/19/2018 12:11:53 PM By: Lenda Kelp PA-C Entered By: Lenda Kelp on 05/19/2018 11:07:00

## 2018-05-28 NOTE — Progress Notes (Signed)
Tina, [name] ([id number]) Visit Report for 05/18/2018 Arrival Information Details Patient Name: Tina, [name] Date of Service: 05/18/2018 10:15 AM Medical Record Number: [id number] Patient Account Number: [id number] Date of Birth/Sex: 10/22/1922 (84 y.o. F) Treating RN: Curtis Sites Primary Care Hadas Jessop: Vonita Moss Other Clinician: Referring Neomia Herbel: Vonita Moss Treating Hatice Bubel/Extender: Linwood Dibbles, [name] Weeks in Treatment: 70 Visit Information History Since Last Visit Added or deleted any medications: No Patient Arrived: Wheel Chair Any new allergies or adverse reactions: No Arrival Time: 10:37 Had a fall or experienced change in No activities of daily living that may affect Accompanied By: caregiver risk of falls: Transfer Assistance: Manual Signs or symptoms of abuse/neglect since last visito No Patient Identification Verified: Yes Hospitalized since last visit: No Secondary Verification Process Completed: Yes Implantable device outside of the clinic excluding No Patient Requires Transmission-Based No cellular tissue based products placed in the center Precautions: since last visit: Patient Has Alerts: No Has Dressing in Place as Prescribed: Yes Pain Present Now: No Electronic Signature(s) Signed: 05/18/2018 5:07:08 PM By: Curtis Sites Entered By: Curtis Sites on 05/18/2018 10:38:12 [name] ([id number]) -------------------------------------------------------------------------------- Clinic Level of Care Assessment Details Patient Name: [name] Date of Service: 05/18/2018 10:15 AM Medical Record Number: [id number] Patient Account Number: [id number] Date of Birth/Sex: 07-24-1923 (84 y.o. F) Treating RN: Phillis Haggis Primary Care Bernadean Saling: Vonita Moss Other Clinician: Referring Charle Mclaurin: Vonita Moss Treating Emmalise Huard/Extender: Linwood Dibbles, [name] Weeks in Treatment: 70 Clinic Level of Care Assessment Items TOOL 4 Quantity  Score X - Use when only an EandM is performed on FOLLOW-UP visit 1 0 ASSESSMENTS - Nursing Assessment / Reassessment X - Reassessment of Co-morbidities (includes updates in patient status) 1 10 X- 1 5 Reassessment of Adherence to Treatment Plan ASSESSMENTS - Wound and Skin Assessment / Reassessment []  - Simple Wound Assessment / Reassessment - one wound 0 X- 3 5 Complex Wound Assessment / Reassessment - multiple wounds []  - 0 Dermatologic / Skin Assessment (not related to wound area) ASSESSMENTS - Focused Assessment []  - Circumferential Edema Measurements - multi extremities 0 []  - 0 Nutritional Assessment / Counseling / Intervention []  - 0 Lower Extremity Assessment (monofilament, tuning fork, pulses) []  - 0 Peripheral Arterial Disease Assessment (using hand held doppler) ASSESSMENTS - Ostomy and/or Continence Assessment and Care []  - Incontinence Assessment and Management 0 []  - 0 Ostomy Care Assessment and Management (repouching, etc.) PROCESS - Coordination of Care []  - Simple Patient / Family Education for ongoing care 0 X- 1 20 Complex (extensive) Patient / Family Education for ongoing care X- 1 10 Staff obtains Chiropractor, Records, Test Results / Process Orders X- 1 10 Staff telephones HHA, Nursing Homes / Clarify orders / etc []  - 0 Routine Transfer to another Facility (non-emergent condition) []  - 0 Routine Hospital Admission (non-emergent condition) []  - 0 New Admissions / Manufacturing engineer / Ordering NPWT, Apligraf, etc. []  - 0 Emergency Hospital Admission (emergent condition) X- 1 10 Simple Discharge Coordination Tina, LESNICK. ([id number]) []  - 0 Complex (extensive) Discharge Coordination PROCESS - Special Needs []  - Pediatric / Minor Patient Management 0 []  - 0 Isolation Patient Management []  - 0 Hearing / Language / Visual special needs []  - 0 Assessment of Community assistance (transportation, D/C planning, etc.) []  - 0 Additional  assistance / Altered mentation []  - 0 Support Surface(s) Assessment (bed, cushion, seat, etc.) INTERVENTIONS - Wound Cleansing / Measurement []  - Simple Wound Cleansing - one wound 0 X- 3 5 Complex Wound Cleansing -  multiple wounds X- 1 5 Wound Imaging (photographs - any number of wounds) []  - 0 Wound Tracing (instead of photographs) []  - 0 Simple Wound Measurement - one wound X- 3 5 Complex Wound Measurement - multiple wounds INTERVENTIONS - Wound Dressings X - Small Wound Dressing one or multiple wounds 3 10 []  - 0 Medium Wound Dressing one or multiple wounds []  - 0 Large Wound Dressing one or multiple wounds X- 1 5 Application of Medications - topical []  - 0 Application of Medications - injection INTERVENTIONS - Miscellaneous []  - External ear exam 0 []  - 0 Specimen Collection (cultures, biopsies, blood, body fluids, etc.) []  - 0 Specimen(s) / Culture(s) sent or taken to Lab for analysis []  - 0 Patient Transfer (multiple staff / Nurse, adult / Similar devices) []  - 0 Simple Staple / Suture removal (25 or less) []  - 0 Complex Staple / Suture removal (26 or more) []  - 0 Hypo / Hyperglycemic Management (close monitor of Blood Glucose) []  - 0 Ankle / Brachial Index (ABI) - do not check if billed separately X- 1 5 Vital Signs Lacour, Lanna W. ([id number]) Has the patient been seen at the hospital within the last three years: Yes Total Score: 155 Level Of Care: New/Established - Level 4 Electronic Signature(s) Signed: 05/19/2018 4:43:18 PM By: Alejandro Mulling Entered By: Alejandro Mulling on 05/18/2018 17:08:13 [name] ([id number]) -------------------------------------------------------------------------------- Encounter Discharge Information Details Patient Name: [name] Date of Service: 05/18/2018 10:15 AM Medical Record Number: [id number] Patient Account Number: [id number] Date of Birth/Sex: [birth year] (84 y.o. F) Treating RN: Renne Crigler Primary Care Mohid Furuya: Vonita Moss Other Clinician: Referring Ladasia Sircy: Vonita Moss Treating Adalbert Alberto/Extender: Linwood Dibbles, [name] Weeks in Treatment: 68 Encounter Discharge Information Items Discharge Condition: Stable Ambulatory Status: Wheelchair Discharge Destination: Home Transportation: Private Auto Schedule Follow-up Appointment: Yes Clinical Summary of Care: Electronic Signature(s) Signed: 05/18/2018 4:53:55 PM By: Renne Crigler Entered By: Renne Crigler on 05/18/2018 11:33:37 [name] ([id number]) -------------------------------------------------------------------------------- Lower Extremity Assessment Details Patient Name: [name] Date of Service: 05/18/2018 10:15 AM Medical Record Number: [id number] Patient Account Number: [id number] Date of Birth/Sex: 09-12-1923 (84 y.o. F) Treating RN: Curtis Sites Primary Care Lashawn Bromwell: Vonita Moss Other Clinician: Referring Teyton Pattillo: Vonita Moss Treating Maguadalupe Lata/Extender: Linwood Dibbles, [name] Weeks in Treatment: 70 Electronic Signature(s) Signed: 05/18/2018 5:07:08 PM By: Curtis Sites Entered By: Curtis Sites on 05/18/2018 10:42:45 [name] ([id number]) -------------------------------------------------------------------------------- Multi Wound Chart Details Patient Name: [name] Date of Service: 05/18/2018 10:15 AM Medical Record Number: [id number] Patient Account Number: [id number] Date of Birth/Sex: 1923/03/28 (84 y.o. F) Treating RN: Phillis Haggis Primary Care Malosi Hemstreet: Vonita Moss Other Clinician: Referring Ayse Mccartin: Vonita Moss Treating Keigan Tafoya/Extender: Linwood Dibbles, [name] Weeks in Treatment: 70 Vital Signs Height(in): 60 Pulse(bpm): 112 Weight(lbs): 90 Blood Pressure(mmHg): 153/50 Body Mass Index(BMI): 18 Temperature(F): 98.1 Respiratory Rate 14 (breaths/min): Photos: [1:No Photos] [12:No Photos] [9:No Photos] Wound Location: [1:Coccyx - Midline]  [12:Right Upper Leg] [9:Left Elbow] Wounding Event: [1:Pressure Injury] [12:Trauma] [9:Trauma] Primary Etiology: [1:Pressure Ulcer] [12:Skin Tear] [9:Trauma, Other] Comorbid History: [1:Anemia, Hypertension, Dementia] [12:Anemia, Hypertension, Dementia] [9:Anemia, Hypertension, Dementia] Date Acquired: [1:12/01/2016] [12:04/26/2018] [9:02/16/2018] Weeks of Treatment: [1:70] [12:0] [9:12] Wound Status: [1:Open] [12:Open] [9:Open] Measurements L x W x D [1:2.8x0.9x0.5] [12:2x2.5x0.1] [9:1.2x0.6x0.1] (cm) Area (cm) : [1:1.979] [12:3.927] [9:0.565] Volume (cm) : [1:0.99] [12:0.393] [9:0.057] % Reduction in Area: [1:52.40%] [12:N/A] [9:53.30%] % Reduction in Volume: [1:40.40%] [12:N/A] [9:52.90%] Starting Position 1 [1:6] (o'clock): Ending Position 1 [1:9] (o'clock): Maximum Distance 1 (cm): [1:2.2] Undermining: [  1:Yes] [12:No] [9:No] Classification: [1:Category/Stage IV] [12:Partial Thickness] [9:Full Thickness Without Exposed Support Structures] Exudate Amount: [1:Large] [12:Large] [9:Small] Exudate Type: [1:Serosanguineous] [12:Serous] [9:Serous] Exudate Color: [1:red, brown] [12:amber] [9:amber] Wound Margin: [1:Flat and Intact] [12:Flat and Intact] [9:Distinct, outline attached] Granulation Amount: [1:Large (67-100%)] [12:Large (67-100%)] [9:Large (67-100%)] Granulation Quality: [1:Red, Hyper-granulation] [12:Red] [9:Red, Pink] Necrotic Amount: [1:None Present (0%)] [12:None Present (0%)] [9:Small (1-33%)] Exposed Structures: [1:Fat Layer (Subcutaneous Tissue) Exposed: Yes Muscle: Yes Fascia: No Tendon: No Joint: No Bone: No] [12:Fascia: No Fat Layer (Subcutaneous Tissue) Exposed: No Tendon: No Muscle: No Joint: No Bone: No] [9:Fascia: No Fat Layer (Subcutaneous Tissue)  Exposed: No Tendon: No Muscle: No Joint: No Bone: No] Epithelialization: None Large (67-100%) None Periwound Skin Texture: Induration: Yes Excoriation: No No Abnormalities Noted Rash: Yes Induration:  No Excoriation: No Callus: No Callus: No Crepitus: No Crepitus: No Rash: No Scarring: No Scarring: No Periwound Skin Moisture: Maceration: Yes Maceration: No Dry/Scaly: Yes Dry/Scaly: No Dry/Scaly: No Periwound Skin Color: Erythema: Yes Erythema: Yes Erythema: Yes Atrophie Blanche: No Atrophie Blanche: No Cyanosis: No Cyanosis: No Ecchymosis: No Ecchymosis: No Hemosiderin Staining: No Hemosiderin Staining: No Mottled: No Mottled: No Pallor: No Pallor: No Rubor: No Rubor: No Erythema Location: Circumferential Circumferential Circumferential Temperature: No Abnormality No Abnormality No Abnormality Tenderness on Palpation: Yes No Yes Wound Preparation: Ulcer Cleansing: Ulcer Cleansing: Ulcer Cleansing: Rinsed/Irrigated with Saline Rinsed/Irrigated with Saline Rinsed/Irrigated with Saline Topical Anesthetic Applied: Topical Anesthetic Applied: Topical Anesthetic Applied: Other: lidocaine 4% Other: lidocaine 4% Other: lidocaine 4% Treatment Notes Electronic Signature(s) Signed: 05/19/2018 4:43:18 PM By: Alejandro Mulling Entered By: Alejandro Mulling on 05/18/2018 11:03:57 [name] ([id number]) -------------------------------------------------------------------------------- Multi-Disciplinary Care Plan Details Patient Name: [name] Date of Service: 05/18/2018 10:15 AM Medical Record Number: [id number] Patient Account Number: [id number] Date of Birth/Sex: [birth year] (84 y.o. F) Treating RN: Phillis Haggis Primary Care Tyree Fluharty: Vonita Moss Other Clinician: Referring Raneshia Derick: Vonita Moss Treating Halla Chopp/Extender: Linwood Dibbles, [name] Weeks in Treatment: 70 Active Inactive ` Abuse / Safety / Falls / Self Care Management Nursing Diagnoses: Impaired physical mobility Potential for falls Goals: Patient will remain injury free Date Initiated: 01/07/2017 Target Resolution Date: 04/03/2017 Goal Status: Active Interventions: Assess fall risk on  admission and as needed Notes: ` Nutrition Nursing Diagnoses: Potential for alteratiion in Nutrition/Potential for imbalanced nutrition Goals: Patient/caregiver agrees to and verbalizes understanding of need to use nutritional supplements and/or vitamins as prescribed Date Initiated: 01/07/2017 Target Resolution Date: 04/03/2017 Goal Status: Active Interventions: Assess patient nutrition upon admission and as needed per policy Notes: ` Orientation to the Wound Care Program Nursing Diagnoses: Knowledge deficit related to the wound healing center program Goals: Patient/caregiver will verbalize understanding of the Wound Healing Center Program Date Initiated: 01/07/2017 Target Resolution Date: 04/03/2017 Goal Status: Active Tina, [name] ([id number]) Interventions: Provide education on orientation to the wound center Notes: ` Pressure Nursing Diagnoses: Knowledge deficit related to causes and risk factors for pressure ulcer development Goals: Patient will remain free from development of additional pressure ulcers Date Initiated: 01/07/2017 Target Resolution Date: 04/03/2017 Goal Status: Active Interventions: Assess potential for pressure ulcer upon admission and as needed Notes: ` Wound/Skin Impairment Nursing Diagnoses: Impaired tissue integrity Goals: Patient/caregiver will verbalize understanding of skin care regimen Date Initiated: 01/07/2017 Target Resolution Date: 04/03/2017 Goal Status: Active Ulcer/skin breakdown will have a volume reduction of 30% by week 4 Date Initiated: 01/07/2017 Target Resolution Date: 04/03/2017 Goal Status: Active Ulcer/skin breakdown will have a volume reduction of 50% by week 8 Date Initiated:  01/07/2017 Target Resolution Date: 04/03/2017 Goal Status: Active Ulcer/skin breakdown will have a volume reduction of 80% by week 12 Date Initiated: 01/07/2017 Target Resolution Date: 04/03/2017 Goal Status: Active Ulcer/skin breakdown will heal within  14 weeks Date Initiated: 01/07/2017 Target Resolution Date: 04/03/2017 Goal Status: Active Interventions: Assess patient/caregiver ability to obtain necessary supplies Assess patient/caregiver ability to perform ulcer/skin care regimen upon admission and as needed Assess ulceration(s) every visit Notes: Electronic Signature(s) Tina, [name] ([id number]) Signed: 05/19/2018 4:43:18 PM By: Alejandro Mulling Entered By: Alejandro Mulling on 05/18/2018 11:03:47 [name] ([id number]) -------------------------------------------------------------------------------- Pain Assessment Details Patient Name: [name] Date of Service: 05/18/2018 10:15 AM Medical Record Number: [id number] Patient Account Number: [id number] Date of Birth/Sex: 10-23-1922 (84 y.o. F) Treating RN: Curtis Sites Primary Care [name]: Vonita Moss Other Clinician: Referring Jawanda Passey: Vonita Moss Treating Tuleen Mandelbaum/Extender: Linwood Dibbles, [name] Weeks in Treatment: 70 Active Problems Location of Pain Severity and Description of Pain Patient Has Paino Patient Unable to Respond Site Locations Pain Management and Medication Current Pain Management: Electronic Signature(s) Signed: 05/18/2018 5:07:08 PM By: Curtis Sites Entered By: Curtis Sites on 05/18/2018 10:38:19 [name] ([id number]) -------------------------------------------------------------------------------- Patient/Caregiver Education Details Patient Name: [name] Date of Service: 05/18/2018 10:15 AM Medical Record Number: [id number] Patient Account Number: [id number] Date of Birth/Gender: 1923/04/08 (84 y.o. F) Treating RN: Renne Crigler Primary Care Physician: Vonita Moss Other Clinician: Referring Physician: Vonita Moss Treating Physician/Extender: Skeet Simmer in Treatment: 85 Education Assessment Education Provided To: Patient Education Topics Provided Wound/Skin Impairment: Handouts: Caring for  Your Ulcer Methods: Explain/Verbal Responses: State content correctly Electronic Signature(s) Signed: 05/18/2018 4:53:55 PM By: Renne Crigler Entered By: Renne Crigler on 05/18/2018 11:33:48 [name] ([id number]) -------------------------------------------------------------------------------- Wound Assessment Details Patient Name: [name] Date of Service: 05/18/2018 10:15 AM Medical Record Number: [id number] Patient Account Number: [id number] Date of Birth/Sex: 03-Dec-1922 (84 y.o. F) Treating RN: Curtis Sites Primary Care Brayden Brodhead: Vonita Moss Other Clinician: Referring Jerelene Salaam: Vonita Moss Treating Cesario Weidinger/Extender: [name], [name] Weeks in Treatment: 70 Wound Status Wound Number: 1 Primary Etiology: Pressure Ulcer Wound Location: Coccyx - Midline Wound Status: Open Wounding Event: Pressure Injury Comorbid History: Anemia, Hypertension, Dementia Date Acquired: 12/01/2016 Weeks Of Treatment: 70 Clustered Wound: No Photos Photo Uploaded By: Curtis Sites on 05/18/2018 11:31:12 Wound Measurements Length: (cm) 2.8 Width: (cm) 0.9 Depth: (cm) 0.5 Area: (cm) 1.979 Volume: (cm) 0.99 % Reduction in Area: 52.4% % Reduction in Volume: 40.4% Epithelialization: None Tunneling: No Undermining: Yes Starting Position (o'clock): 6 Ending Position (o'clock): 9 Maximum Distance: (cm) 2.2 Wound Description Classification: Category/Stage IV Wound Margin: Flat and Intact Exudate Amount: Large Exudate Type: Serosanguineous Exudate Color: red, brown Foul Odor After Cleansing: No Slough/Fibrino Yes Wound Bed Granulation Amount: Large (67-100%) Exposed Structure Granulation Quality: Red, Hyper-granulation Fascia Exposed: No Necrotic Amount: None Present (0%) Fat Layer (Subcutaneous Tissue) Exposed: Yes Tendon Exposed: No Muscle Exposed: Yes Tina, HEIDE. ([id number]) Necrosis of Muscle: No Joint Exposed: No Bone Exposed: No Periwound Skin  Texture Texture Color No Abnormalities Noted: No No Abnormalities Noted: No Callus: No Atrophie Blanche: No Crepitus: No Cyanosis: No Excoriation: No Ecchymosis: No Induration: Yes Erythema: Yes Rash: Yes Erythema Location: Circumferential Scarring: No Hemosiderin Staining: No Mottled: No Moisture Pallor: No No Abnormalities Noted: No Rubor: No Dry / Scaly: No Maceration: Yes Temperature / Pain Temperature: No Abnormality Tenderness on Palpation: Yes Wound Preparation Ulcer Cleansing: Rinsed/Irrigated with Saline Topical Anesthetic Applied: Other: lidocaine 4%, Treatment Notes Wound #1 (Midline Coccyx) 1.  Cleansed with: Clean wound with Normal Saline 2. Anesthetic Topical Lidocaine 4% cream to wound bed prior to debridement 4. Dressing Applied: Prisma Ag Other dressing (specify in notes) 5. Secondary Dressing Applied Bordered Foam Dressing Notes silver cell, Electronic Signature(s) Signed: 05/18/2018 5:07:08 PM By: Curtis Sitesorthy, Joanna Entered By: Curtis Sitesorthy, Joanna on 05/18/2018 10:49:22 Tina MainlandSHAW, [name] ([id number]) -------------------------------------------------------------------------------- Wound Assessment Details Patient Name: Tina MainlandSHAW, [name] Date of Service: 05/18/2018 10:15 AM Medical Record Number: [id number] Patient Account Number: [id number] Date of Birth/Sex: Aug 14, 1923 (84 y.o. F) Treating RN: Curtis Sitesorthy, Joanna Primary Care Tyreshia Ingman: Vonita MossRISSMAN, MARK Other Clinician: Referring Paras Kreider: Vonita MossRISSMAN, MARK Treating Rhodia Acres/Extender: [name], [name] Weeks in Treatment: 70 Wound Status Wound Number: 12 Primary Etiology: Skin Tear Wound Location: Right Upper Leg Wound Status: Open Wounding Event: Trauma Comorbid History: Anemia, Hypertension, Dementia Date Acquired: 04/26/2018 Weeks Of Treatment: 0 Clustered Wound: No Photos Photo Uploaded By: Curtis Sitesorthy, Joanna on 05/18/2018 11:31:12 Wound Measurements Length: (cm) 2 Width: (cm) 2.5 Depth: (cm) 0.1 Area:  (cm) 3.927 Volume: (cm) 0.393 % Reduction in Area: % Reduction in Volume: Epithelialization: Large (67-100%) Tunneling: No Undermining: No Wound Description Classification: Partial Thickness Wound Margin: Flat and Intact Exudate Amount: Large Exudate Type: Serous Exudate Color: amber Foul Odor After Cleansing: No Slough/Fibrino No Wound Bed Granulation Amount: Large (67-100%) Exposed Structure Granulation Quality: Red Fascia Exposed: No Necrotic Amount: None Present (0%) Fat Layer (Subcutaneous Tissue) Exposed: No Tendon Exposed: No Muscle Exposed: No Joint Exposed: No Bone Exposed: No Periwound Skin Texture Tina MainlandSHAW, [name] ([id number]) Texture Color No Abnormalities Noted: No No Abnormalities Noted: No Callus: No Atrophie Blanche: No Crepitus: No Cyanosis: No Excoriation: No Ecchymosis: No Induration: No Erythema: Yes Rash: No Erythema Location: Circumferential Scarring: No Hemosiderin Staining: No Mottled: No Moisture Pallor: No No Abnormalities Noted: No Rubor: No Dry / Scaly: No Maceration: No Temperature / Pain Temperature: No Abnormality Wound Preparation Ulcer Cleansing: Rinsed/Irrigated with Saline Topical Anesthetic Applied: Other: lidocaine 4%, Treatment Notes Wound #12 (Right Upper Leg) 1. Cleansed with: Clean wound with Normal Saline 2. Anesthetic Topical Lidocaine 4% cream to wound bed prior to debridement 4. Dressing Applied: Foam Notes conform and stretch net Electronic Signature(s) Signed: 05/18/2018 5:07:08 PM By: Curtis Sitesorthy, Joanna Entered By: Curtis Sitesorthy, Joanna on 05/18/2018 10:52:24 Tina MainlandSHAW, [name] ([id number]) -------------------------------------------------------------------------------- Wound Assessment Details Patient Name: Tina MainlandSHAW, [name] Date of Service: 05/18/2018 10:15 AM Medical Record Number: [id number] Patient Account Number: [id number] Date of Birth/Sex: Aug 14, 1923 (84 y.o. F) Treating RN: Curtis Sitesorthy, Joanna Primary  Care Patrecia Veiga: Vonita MossRISSMAN, MARK Other Clinician: Referring [name] [name]: Vonita MossRISSMAN, MARK Treating Kadisha Goodine/Extender: [name], [name] Weeks in Treatment: 70 Wound Status Wound Number: 9 Primary Etiology: Trauma, Other Wound Location: Left Elbow Wound Status: Open Wounding Event: Trauma Comorbid History: Anemia, Hypertension, Dementia Date Acquired: 02/16/2018 Weeks Of Treatment: 12 Clustered Wound: No Photos Photo Uploaded By: Curtis Sitesorthy, Joanna on 05/18/2018 11:31:26 Wound Measurements Length: (cm) 1.2 Width: (cm) 0.6 Depth: (cm) 0.1 Area: (cm) 0.565 Volume: (cm) 0.057 % Reduction in Area: 53.3% % Reduction in Volume: 52.9% Epithelialization: None Tunneling: No Undermining: No Wound Description Full Thickness Without Exposed Support Foul O Classification: Structures Slough Wound Margin: Distinct, outline attached Exudate Small Amount: Exudate Type: Serous Exudate Color: amber dor After Cleansing: No /Fibrino Yes Wound Bed Granulation Amount: Large (67-100%) Exposed Structure Granulation Quality: Red, Pink Fascia Exposed: No Necrotic Amount: Small (1-33%) Fat Layer (Subcutaneous Tissue) Exposed: No Tendon Exposed: No Muscle Exposed: No Joint Exposed: No Bone Exposed: No Low, Shandel W. ([id number]) Periwound Skin Texture Texture Color No Abnormalities  Noted: No No Abnormalities Noted: No Erythema: Yes Moisture Erythema Location: Circumferential No Abnormalities Noted: No Dry / Scaly: Yes Temperature / Pain Temperature: No Abnormality Tenderness on Palpation: Yes Wound Preparation Ulcer Cleansing: Rinsed/Irrigated with Saline Topical Anesthetic Applied: Other: lidocaine 4%, Treatment Notes Wound #9 (Left Elbow) 1. Cleansed with: Clean wound with Normal Saline 2. Anesthetic Topical Lidocaine 4% cream to wound bed prior to debridement 4. Dressing Applied: Foam Notes conform and stretch net Electronic Signature(s) Signed: 05/18/2018 5:07:08 PM By:  Curtis Sites Entered By: Curtis Sites on 05/18/2018 10:49:35 [name] ([id number]) -------------------------------------------------------------------------------- Vitals Details Patient Name: [name] Date of Service: 05/18/2018 10:15 AM Medical Record Number: [id number] Patient Account Number: [id number] Date of Birth/Sex: 05-17-1923 (84 y.o. F) Treating RN: Curtis Sites Primary Care Dawnette Mione: Vonita Moss Other Clinician: Referring Vearl Allbaugh: Vonita Moss Treating Tkeya Stencil/Extender: Linwood Dibbles, [name] Weeks in Treatment: 70 Vital Signs Time Taken: 10:38 Temperature (F): 98.1 Height (in): 60 Pulse (bpm): 112 Weight (lbs): 90 Respiratory Rate (breaths/min): 14 Body Mass Index (BMI): 17.6 Blood Pressure (mmHg): 153/50 Reference Range: 80 - 120 mg / dl Electronic Signature(s) Signed: 05/18/2018 5:07:08 PM By: Curtis Sites Entered By: Curtis Sites on 05/18/2018 10:42:37

## 2018-06-01 ENCOUNTER — Telehealth: Payer: Self-pay

## 2018-06-01 ENCOUNTER — Encounter: Payer: Medicare Other | Attending: Physician Assistant | Admitting: Physician Assistant

## 2018-06-01 DIAGNOSIS — L89153 Pressure ulcer of sacral region, stage 3: Secondary | ICD-10-CM | POA: Insufficient documentation

## 2018-06-01 DIAGNOSIS — L89023 Pressure ulcer of left elbow, stage 3: Secondary | ICD-10-CM | POA: Insufficient documentation

## 2018-06-01 DIAGNOSIS — L03312 Cellulitis of back [any part except buttock]: Secondary | ICD-10-CM | POA: Diagnosis not present

## 2018-06-01 DIAGNOSIS — G20 Parkinson's disease: Secondary | ICD-10-CM | POA: Diagnosis not present

## 2018-06-01 DIAGNOSIS — F0280 Dementia in other diseases classified elsewhere without behavioral disturbance: Secondary | ICD-10-CM | POA: Diagnosis not present

## 2018-06-01 NOTE — Telephone Encounter (Signed)
Family is requesting letter stating that pt's diagnosis, that she is not able to handle her own financial affairs. Must be on Letter head and hand signed and dated by the provider in ink. This is needed due to the passing of Mr. Clelia Croft.  [name] would like to pick up this afternoon as she will be in town.

## 2018-06-02 NOTE — Telephone Encounter (Signed)
Call pt done 

## 2018-06-02 NOTE — Telephone Encounter (Signed)
Family aware. Copy made for scan box. Original placed in file folder to await pick up.

## 2018-06-03 NOTE — Telephone Encounter (Signed)
Pt daughter renee would like Mel Almond to return her call concerning another form

## 2018-06-03 NOTE — Progress Notes (Signed)
CHIMENE, SALO ([id number]) Visit Report for 06/01/2018 Arrival Information Details Patient Name: [name], [name] Date of Service: 06/01/2018 10:00 AM Medical Record Number: [id number] Patient Account Number: [id number] Date of Birth/Sex: 09-25-1923 (84 y.o. Female) Treating RN: Renne Crigler Primary Care Jensen Kilburg: Vonita Moss Other Clinician: Referring English Tomer: Vonita Moss Treating Jullian Previti/Extender: Linwood Dibbles, HOYT Weeks in Treatment: 72 Visit Information History Since Last Visit All ordered tests and consults were No Patient Arrived: Wheel Chair completed: Arrival Time: 10:06 Added or deleted any medications: No Accompanied By: daughter and Any new allergies or adverse reactions: No caregiver Had a fall or experienced change in No Transfer Assistance: Manual activities of daily living that may affect Patient Identification Verified: Yes risk of falls: Secondary Verification Process Yes Signs or symptoms of abuse/neglect since No Completed: last visito Patient Requires Transmission-Based No Hospitalized since last visit: No Precautions: Implantable device outside of the clinic No Patient Has Alerts: No excluding cellular tissue based products placed in the center since last visit: Pain Present Now: Unable to Respond Electronic Signature(s) Signed: 06/01/2018 4:40:26 PM By: Renne Crigler Entered By: Renne Crigler on 06/01/2018 10:06:54 [name] ([id number]) -------------------------------------------------------------------------------- Clinic Level of Care Assessment Details Patient Name: [name] Date of Service: 06/01/2018 10:00 AM Medical Record Number: [id number] Patient Account Number: [id number] Date of Birth/Sex: October 14, 1922 (84 y.o. Female) Treating RN: Curtis Sites Primary Care Jesslynn Kruck: Vonita Moss Other Clinician: Referring Ileane Sando: Vonita Moss Treating Mariama Saintvil/Extender: Linwood Dibbles, HOYT Weeks in Treatment:  72 Clinic Level of Care Assessment Items TOOL 4 Quantity Score []  - Use when only an EandM is performed on FOLLOW-UP visit 0 ASSESSMENTS - Nursing Assessment / Reassessment X - Reassessment of Co-morbidities (includes updates in patient status) 1 10 X- 1 5 Reassessment of Adherence to Treatment Plan ASSESSMENTS - Wound and Skin Assessment / Reassessment []  - Simple Wound Assessment / Reassessment - one wound 0 X- 3 5 Complex Wound Assessment / Reassessment - multiple wounds []  - 0 Dermatologic / Skin Assessment (not related to wound area) ASSESSMENTS - Focused Assessment []  - Circumferential Edema Measurements - multi extremities 0 []  - 0 Nutritional Assessment / Counseling / Intervention X- 1 5 Lower Extremity Assessment (monofilament, tuning fork, pulses) []  - 0 Peripheral Arterial Disease Assessment (using hand held doppler) ASSESSMENTS - Ostomy and/or Continence Assessment and Care []  - Incontinence Assessment and Management 0 []  - 0 Ostomy Care Assessment and Management (repouching, etc.) PROCESS - Coordination of Care X - Simple Patient / Family Education for ongoing care 1 15 []  - 0 Complex (extensive) Patient / Family Education for ongoing care []  - 0 Staff obtains Chiropractor, Records, Test Results / Process Orders []  - 0 Staff telephones HHA, Nursing Homes / Clarify orders / etc []  - 0 Routine Transfer to another Facility (non-emergent condition) []  - 0 Routine Hospital Admission (non-emergent condition) []  - 0 New Admissions / Manufacturing engineer / Ordering NPWT, Apligraf, etc. []  - 0 Emergency Hospital Admission (emergent condition) X- 1 10 Simple Discharge Coordination ROSAELENA, KEMNITZ. ([id number]) []  - 0 Complex (extensive) Discharge Coordination PROCESS - Special Needs []  - Pediatric / Minor Patient Management 0 []  - 0 Isolation Patient Management []  - 0 Hearing / Language / Visual special needs []  - 0 Assessment of Community assistance  (transportation, D/C planning, etc.) []  - 0 Additional assistance / Altered mentation []  - 0 Support Surface(s) Assessment (bed, cushion, seat, etc.) INTERVENTIONS - Wound Cleansing / Measurement []  - Simple Wound Cleansing - one wound 0 X-  3 5 Complex Wound Cleansing - multiple wounds X- 1 5 Wound Imaging (photographs - any number of wounds) []  - 0 Wound Tracing (instead of photographs) []  - 0 Simple Wound Measurement - one wound X- 3 5 Complex Wound Measurement - multiple wounds INTERVENTIONS - Wound Dressings X - Small Wound Dressing one or multiple wounds 2 10 []  - 0 Medium Wound Dressing one or multiple wounds []  - 0 Large Wound Dressing one or multiple wounds []  - 0 Application of Medications - topical []  - 0 Application of Medications - injection INTERVENTIONS - Miscellaneous []  - External ear exam 0 []  - 0 Specimen Collection (cultures, biopsies, blood, body fluids, etc.) []  - 0 Specimen(s) / Culture(s) sent or taken to Lab for analysis []  - 0 Patient Transfer (multiple staff / Nurse, adult / Similar devices) []  - 0 Simple Staple / Suture removal (25 or less) []  - 0 Complex Staple / Suture removal (26 or more) []  - 0 Hypo / Hyperglycemic Management (close monitor of Blood Glucose) []  - 0 Ankle / Brachial Index (ABI) - do not check if billed separately X- 1 5 Vital Signs Attridge, Khanh W. ([id number]) Has the patient been seen at the hospital within the last three years: Yes Total Score: 120 Level Of Care: New/Established - Level 4 Electronic Signature(s) Signed: 06/01/2018 5:51:08 PM By: Curtis Sites Entered By: Curtis Sites on 06/01/2018 10:49:38 [name] ([id number]) -------------------------------------------------------------------------------- Encounter Discharge Information Details Patient Name: [name] Date of Service: 06/01/2018 10:00 AM Medical Record Number: [id number] Patient Account Number: [id number] Date of Birth/Sex:  23-Jul-1923 (84 y.o. Female) Treating RN: Huel Coventry Primary Care Kiva Norland: Vonita Moss Other Clinician: Referring Kalynn Declercq: Vonita Moss Treating Emarion Toral/Extender: Linwood Dibbles, HOYT Weeks in Treatment: 35 Encounter Discharge Information Items Discharge Condition: Stable Ambulatory Status: Wheelchair Discharge Destination: Home Transportation: Private Auto Accompanied By: daughter and caregiver Schedule Follow-up Appointment: Yes Clinical Summary of Care: Electronic Signature(s) Signed: 06/01/2018 6:56:50 PM By: Elliot Gurney, BSN, RN, CWS, Kim RN, BSN Entered By: Elliot Gurney, BSN, RN, CWS, Kim on 06/01/2018 11:00:19 [name] ([id number]) -------------------------------------------------------------------------------- Lower Extremity Assessment Details Patient Name: [name] Date of Service: 06/01/2018 10:00 AM Medical Record Number: [id number] Patient Account Number: [id number] Date of Birth/Sex: 11-Jun-1923 (84 y.o. Female) Treating RN: Renne Crigler Primary Care Selim Durden: Vonita Moss Other Clinician: Referring Inetha Maret: Vonita Moss Treating Jakyah Bradby/Extender: Linwood Dibbles, HOYT Weeks in Treatment: 72 Electronic Signature(s) Signed: 06/01/2018 4:40:26 PM By: Renne Crigler Entered By: Renne Crigler on 06/01/2018 10:24:30 [name] ([id number]) -------------------------------------------------------------------------------- Multi Wound Chart Details Patient Name: [name] Date of Service: 06/01/2018 10:00 AM Medical Record Number: [id number] Patient Account Number: [id number] Date of Birth/Sex: 12/23/1922 (84 y.o. Female) Treating RN: Curtis Sites Primary Care Ac Colan: Vonita Moss Other Clinician: Referring Lashawnna Lambrecht: Vonita Moss Treating Shreyas Piatkowski/Extender: Linwood Dibbles, HOYT Weeks in Treatment: 72 Vital Signs Height(in): 60 Pulse(bpm): 59 Weight(lbs): 90 Blood Pressure(mmHg): 131/33 Body Mass Index(BMI): 18 Temperature(F):  97.8 Respiratory Rate 14 (breaths/min): Photos: [1:No Photos] [12:No Photos] [9:No Photos] Wound Location: [1:Coccyx - Midline] [12:Right Upper Leg] [9:Left Elbow] Wounding Event: [1:Pressure Injury] [12:Trauma] [9:Trauma] Primary Etiology: [1:Pressure Ulcer] [12:Skin Tear] [9:Trauma, Other] Comorbid History: [1:Anemia, Hypertension, Dementia] [12:Anemia, Hypertension, Dementia] [9:Anemia, Hypertension, Dementia] Date Acquired: [1:12/01/2016] [12:04/26/2018] [9:02/16/2018] Weeks of Treatment: [1:72] [12:2] [9:14] Wound Status: [1:Open] [12:Open] [9:Open] Measurements L x W x D [1:2.5x0.8x0.4] [12:0.1x0.1x0.1] [9:0.7x0.5x0.1] (cm) Area (cm) : [1:1.571] [12:0.008] [9:0.275] Volume (cm) : [1:0.628] [12:0.001] [9:0.027] % Reduction in Area: [1:62.20%] [12:99.80%] [9:77.30%] % Reduction in Volume: [  1:62.20%] [12:99.70%] [9:77.70%] Starting Position 1 [1:6] (o'clock): Ending Position 1 [1:7] (o'clock): Maximum Distance 1 (cm): [1:1.2] Undermining: [1:Yes] [12:No] [9:No] Classification: [1:Category/Stage IV] [12:Partial Thickness] [9:Full Thickness Without Exposed Support Structures] Exudate Amount: [1:Large] [12:None Present] [9:Small] Exudate Type: [1:Serosanguineous] [12:N/A] [9:Serous] Exudate Color: [1:red, brown] [12:N/A] [9:amber] Wound Margin: [1:Flat and Intact] [12:Flat and Intact] [9:Distinct, outline attached] Granulation Amount: [1:Large (67-100%)] [12:None Present (0%)] [9:Large (67-100%)] Granulation Quality: [1:Red, Hyper-granulation] [12:N/A] [9:Red, Pink] Necrotic Amount: [1:None Present (0%)] [12:None Present (0%)] [9:Small (1-33%)] Exposed Structures: [1:Fat Layer (Subcutaneous Tissue) Exposed: Yes Muscle: Yes Fascia: No Tendon: No Joint: No Bone: No] [12:Fascia: No Fat Layer (Subcutaneous Tissue) Exposed: No Tendon: No Muscle: No Joint: No Bone: No] [9:Fascia: No Fat Layer (Subcutaneous Tissue)  Exposed: No Tendon: No Muscle: No Joint: No Bone: No] Epithelialization:  None Large (67-100%) None Periwound Skin Texture: Induration: Yes Excoriation: No No Abnormalities Noted Rash: Yes Induration: No Excoriation: No Callus: No Callus: No Crepitus: No Crepitus: No Rash: No Scarring: No Scarring: No Periwound Skin Moisture: Maceration: Yes Maceration: No Dry/Scaly: Yes Dry/Scaly: No Dry/Scaly: No Periwound Skin Color: Atrophie Blanche: No Atrophie Blanche: No Erythema: No Cyanosis: No Cyanosis: No Ecchymosis: No Ecchymosis: No Erythema: No Erythema: No Hemosiderin Staining: No Hemosiderin Staining: No Mottled: No Mottled: No Pallor: No Pallor: No Rubor: No Rubor: No Temperature: No Abnormality No Abnormality No Abnormality Tenderness on Palpation: Yes No Yes Wound Preparation: Ulcer Cleansing: Ulcer Cleansing: Ulcer Cleansing: Rinsed/Irrigated with Saline Rinsed/Irrigated with Saline Rinsed/Irrigated with Saline Topical Anesthetic Applied: Topical Anesthetic Applied: Other: lidocaine 4% Other: lidocaine 4% Treatment Notes Electronic Signature(s) Signed: 06/01/2018 5:51:08 PM By: Curtis Sites Entered By: Curtis Sites on 06/01/2018 10:31:11 [name] ([id number]) -------------------------------------------------------------------------------- Multi-Disciplinary Care Plan Details Patient Name: [name] Date of Service: 06/01/2018 10:00 AM Medical Record Number: [id number] Patient Account Number: [id number] Date of Birth/Sex: 01-29-1923 (84 y.o. Female) Treating RN: Curtis Sites Primary Care Vere Diantonio: Vonita Moss Other Clinician: Referring Notnamed Croucher: Vonita Moss Treating David Rodriquez/Extender: Linwood Dibbles, HOYT Weeks in Treatment: 72 Active Inactive ` Abuse / Safety / Falls / Self Care Management Nursing Diagnoses: Impaired physical mobility Potential for falls Goals: Patient will remain injury free Date Initiated: 01/07/2017 Target Resolution Date: 04/03/2017 Goal Status: Active Interventions: Assess  fall risk on admission and as needed Notes: ` Nutrition Nursing Diagnoses: Potential for alteratiion in Nutrition/Potential for imbalanced nutrition Goals: Patient/caregiver agrees to and verbalizes understanding of need to use nutritional supplements and/or vitamins as prescribed Date Initiated: 01/07/2017 Target Resolution Date: 04/03/2017 Goal Status: Active Interventions: Assess patient nutrition upon admission and as needed per policy Notes: ` Orientation to the Wound Care Program Nursing Diagnoses: Knowledge deficit related to the wound healing center program Goals: Patient/caregiver will verbalize understanding of the Wound Healing Center Program Date Initiated: 01/07/2017 Target Resolution Date: 04/03/2017 Goal Status: Active HAADIYA, FROGGE ([id number]) Interventions: Provide education on orientation to the wound center Notes: ` Pressure Nursing Diagnoses: Knowledge deficit related to causes and risk factors for pressure ulcer development Goals: Patient will remain free from development of additional pressure ulcers Date Initiated: 01/07/2017 Target Resolution Date: 04/03/2017 Goal Status: Active Interventions: Assess potential for pressure ulcer upon admission and as needed Notes: ` Wound/Skin Impairment Nursing Diagnoses: Impaired tissue integrity Goals: Patient/caregiver will verbalize understanding of skin care regimen Date Initiated: 01/07/2017 Target Resolution Date: 04/03/2017 Goal Status: Active Ulcer/skin breakdown will have a volume reduction of 30% by week 4 Date Initiated: 01/07/2017 Target Resolution Date: 04/03/2017 Goal Status: Active Ulcer/skin breakdown will have  a volume reduction of 50% by week 8 Date Initiated: 01/07/2017 Target Resolution Date: 04/03/2017 Goal Status: Active Ulcer/skin breakdown will have a volume reduction of 80% by week 12 Date Initiated: 01/07/2017 Target Resolution Date: 04/03/2017 Goal Status: Active Ulcer/skin breakdown will  heal within 14 weeks Date Initiated: 01/07/2017 Target Resolution Date: 04/03/2017 Goal Status: Active Interventions: Assess patient/caregiver ability to obtain necessary supplies Assess patient/caregiver ability to perform ulcer/skin care regimen upon admission and as needed Assess ulceration(s) every visit Notes: Electronic Signature(s) TIRZAH, FROSS ([id number]) Signed: 06/01/2018 5:51:08 PM By: Curtis Sites Entered By: Curtis Sites on 06/01/2018 10:31:03 [name] ([id number]) -------------------------------------------------------------------------------- Pain Assessment Details Patient Name: [name] Date of Service: 06/01/2018 10:00 AM Medical Record Number: [id number] Patient Account Number: [id number] Date of Birth/Sex: [birth year] (84 y.o. Female) Treating RN: Renne Crigler Primary Care Lusia Greis: Vonita Moss Other Clinician: Referring Montine Hight: Vonita Moss Treating Che Rachal/Extender: Linwood Dibbles, HOYT Weeks in Treatment: 72 Active Problems Location of Pain Severity and Description of Pain Patient Has Paino Patient Unable to Respond Site Locations Pain Management and Medication Current Pain Management: Electronic Signature(s) Signed: 06/01/2018 4:40:26 PM By: Renne Crigler Entered By: Renne Crigler on 06/01/2018 10:07:07 [name] ([id number]) -------------------------------------------------------------------------------- Patient/Caregiver Education Details Patient Name: [name] Date of Service: 06/01/2018 10:00 AM Medical Record Number: [id number] Patient Account Number: [id number] Date of Birth/Gender: 25-Mar-1923 (84 y.o. Female) Treating RN: Huel Coventry Primary Care Physician: Vonita Moss Other Clinician: Referring Physician: Vonita Moss Treating Physician/Extender: Skeet Simmer in Treatment: 78 Education Assessment Education Provided To: Patient Education Topics Provided Wound/Skin  Impairment: Handouts: Caring for Your Ulcer, Other: wound care as prescribed Methods: Demonstration, Explain/Verbal Responses: State content correctly Electronic Signature(s) Signed: 06/01/2018 6:56:50 PM By: Elliot Gurney, BSN, RN, CWS, Kim RN, BSN Entered By: Elliot Gurney, BSN, RN, CWS, Kim on 06/01/2018 11:00:43 [name] ([id number]) -------------------------------------------------------------------------------- Wound Assessment Details Patient Name: [name] Date of Service: 06/01/2018 10:00 AM Medical Record Number: [id number] Patient Account Number: [id number] Date of Birth/Sex: [birth year] (84 y.o. Female) Treating RN: Renne Crigler Primary Care Akaylah Lalley: Vonita Moss Other Clinician: Referring Reagann Dolce: Vonita Moss Treating Cynthie Garmon/Extender: STONE III, HOYT Weeks in Treatment: 72 Wound Status Wound Number: 1 Primary Etiology: Pressure Ulcer Wound Location: Coccyx - Midline Wound Status: Open Wounding Event: Pressure Injury Comorbid History: Anemia, Hypertension, Dementia Date Acquired: 12/01/2016 Weeks Of Treatment: 72 Clustered Wound: No Photos Photo Uploaded By: Renne Crigler on 06/01/2018 14:38:35 Wound Measurements Length: (cm) 2.5 Width: (cm) 0.8 Depth: (cm) 0.4 Area: (cm) 1.571 Volume: (cm) 0.628 % Reduction in Area: 62.2% % Reduction in Volume: 62.2% Epithelialization: None Tunneling: No Undermining: Yes Starting Position (o'clock): 6 Ending Position (o'clock): 7 Maximum Distance: (cm) 1.2 Wound Description Classification: Category/Stage IV Wound Margin: Flat and Intact Exudate Amount: Large Exudate Type: Serosanguineous Exudate Color: red, brown Foul Odor After Cleansing: No Slough/Fibrino Yes Wound Bed Granulation Amount: Large (67-100%) Exposed Structure Granulation Quality: Red, Hyper-granulation Fascia Exposed: No Necrotic Amount: None Present (0%) Fat Layer (Subcutaneous Tissue) Exposed: Yes Tendon Exposed: No Muscle Exposed:  Yes LARISSA, PEGG. ([id number]) Necrosis of Muscle: No Joint Exposed: No Bone Exposed: No Periwound Skin Texture Texture Color No Abnormalities Noted: No No Abnormalities Noted: No Callus: No Atrophie Blanche: No Crepitus: No Cyanosis: No Excoriation: No Ecchymosis: No Induration: Yes Erythema: No Rash: Yes Hemosiderin Staining: No Scarring: No Mottled: No Pallor: No Moisture Rubor: No No Abnormalities Noted: No Dry / Scaly: No Temperature / Pain Maceration: Yes Temperature: No Abnormality Tenderness on Palpation:  Yes Wound Preparation Ulcer Cleansing: Rinsed/Irrigated with Saline Topical Anesthetic Applied: Other: lidocaine 4%, Treatment Notes Wound #1 (Midline Coccyx) 4. Dressing Applied: Prisma Ag Notes Prisma, silver cell, and BFD Electronic Signature(s) Signed: 06/01/2018 4:40:26 PM By: Renne Crigler Entered By: Renne Crigler on 06/01/2018 10:23:25 [name] ([id number]) -------------------------------------------------------------------------------- Wound Assessment Details Patient Name: [name] Date of Service: 06/01/2018 10:00 AM Medical Record Number: [id number] Patient Account Number: [id number] Date of Birth/Sex: [birth year] (84 y.o. Female) Treating RN: Curtis Sites Primary Care Ranisha Allaire: Vonita Moss Other Clinician: Referring Sadler Teschner: Vonita Moss Treating Marlyn Rabine/Extender: Linwood Dibbles, HOYT Weeks in Treatment: 72 Wound Status Wound Number: 12 Primary Etiology: Skin Tear Wound Location: Right Upper Leg Wound Status: Healed - Epithelialized Wounding Event: Trauma Comorbid History: Anemia, Hypertension, Dementia Date Acquired: 04/26/2018 Weeks Of Treatment: 2 Clustered Wound: No Photos Photo Uploaded By: Renne Crigler on 06/01/2018 14:38:36 Wound Measurements Length: (cm) 0 % Re Width: (cm) 0 % Re Depth: (cm) 0 Epit Area: (cm) 0 Tun Volume: (cm) 0 Und duction in Area: 100% duction in Volume:  100% helialization: Large (67-100%) neling: No ermining: No Wound Description Classification: Partial Thickness Wound Margin: Flat and Intact Exudate Amount: None Present Foul Odor After Cleansing: No Slough/Fibrino No Wound Bed Granulation Amount: None Present (0%) Exposed Structure Necrotic Amount: None Present (0%) Fascia Exposed: No Fat Layer (Subcutaneous Tissue) Exposed: No Tendon Exposed: No Muscle Exposed: No Joint Exposed: No Bone Exposed: No Periwound Skin Texture Texture Color No Abnormalities Noted: No No Abnormalities Noted: No EIZA, CANNIFF ([id number]) Callus: No Atrophie Blanche: No Crepitus: No Cyanosis: No Excoriation: No Ecchymosis: No Induration: No Erythema: No Rash: No Hemosiderin Staining: No Scarring: No Mottled: No Pallor: No Moisture Rubor: No No Abnormalities Noted: No Dry / Scaly: No Temperature / Pain Maceration: No Temperature: No Abnormality Wound Preparation Ulcer Cleansing: Rinsed/Irrigated with Saline Electronic Signature(s) Signed: 06/01/2018 5:51:08 PM By: Curtis Sites Entered By: Curtis Sites on 06/01/2018 10:36:24 [name] ([id number]) -------------------------------------------------------------------------------- Wound Assessment Details Patient Name: [name] Date of Service: 06/01/2018 10:00 AM Medical Record Number: [id number] Patient Account Number: [id number] Date of Birth/Sex: 1922-10-20 (84 y.o. Female) Treating RN: Renne Crigler Primary Care Adaleah Forget: Vonita Moss Other Clinician: Referring Lynesha Bango: Vonita Moss Treating Avalyn Molino/Extender: STONE III, HOYT Weeks in Treatment: 72 Wound Status Wound Number: 9 Primary Etiology: Trauma, Other Wound Location: Left Elbow Wound Status: Open Wounding Event: Trauma Comorbid History: Anemia, Hypertension, Dementia Date Acquired: 02/16/2018 Weeks Of Treatment: 14 Clustered Wound: No Photos Photo Uploaded By: Renne Crigler on  06/01/2018 14:38:49 Wound Measurements Length: (cm) 0.7 Width: (cm) 0.5 Depth: (cm) 0.1 Area: (cm) 0.275 Volume: (cm) 0.027 % Reduction in Area: 77.3% % Reduction in Volume: 77.7% Epithelialization: None Tunneling: No Undermining: No Wound Description Full Thickness Without Exposed Support Classification: Structures Wound Margin: Distinct, outline attached Exudate Small Amount: Exudate Type: Serous Exudate Color: amber Foul Odor After Cleansing: No Slough/Fibrino Yes Wound Bed Granulation Amount: Large (67-100%) Exposed Structure Granulation Quality: Red, Pink Fascia Exposed: No Necrotic Amount: Small (1-33%) Fat Layer (Subcutaneous Tissue) Exposed: No Tendon Exposed: No Muscle Exposed: No Joint Exposed: No Bone Exposed: No MELEANE, SELINGER. ([id number]) Periwound Skin Texture Texture Color No Abnormalities Noted: No No Abnormalities Noted: No Erythema: No Moisture No Abnormalities Noted: No Temperature / Pain Dry / Scaly: Yes Temperature: No Abnormality Tenderness on Palpation: Yes Wound Preparation Ulcer Cleansing: Rinsed/Irrigated with Saline Topical Anesthetic Applied: Other: lidocaine 4%, Treatment Notes Wound #9 (Left Elbow) 4. Dressing Applied: Xeroform 5. Secondary Dressing  Applied Foam Notes conform and stretch net Electronic Signature(s) Signed: 06/01/2018 4:40:26 PM By: Renne Crigler Entered By: Renne Crigler on 06/01/2018 10:24:21 [name] ([id number]) -------------------------------------------------------------------------------- Vitals Details Patient Name: [name] Date of Service: 06/01/2018 10:00 AM Medical Record Number: [id number] Patient Account Number: [id number] Date of Birth/Sex: 04-Jul-1923 (84 y.o. Female) Treating RN: Renne Crigler Primary Care Brannon Decaire: Vonita Moss Other Clinician: Referring Laloni Rowton: Vonita Moss Treating Yesli Vanderhoff/Extender: STONE III, HOYT Weeks in Treatment: 72 Vital  Signs Time Taken: 10:07 Temperature (F): 97.8 Height (in): 60 Pulse (bpm): 59 Weight (lbs): 90 Respiratory Rate (breaths/min): 14 Body Mass Index (BMI): 17.6 Blood Pressure (mmHg): 131/33 Reference Range: 80 - 120 mg / dl Electronic Signature(s) Signed: 06/01/2018 4:40:26 PM By: Renne Crigler Entered By: Renne Crigler on 06/01/2018 10:08:12

## 2018-06-03 NOTE — Progress Notes (Signed)
[name], [name] ([id number]) Visit Report for 06/01/2018 Chief Complaint Document Details Patient Name: Tina, [name] Date of Service: 06/01/2018 10:00 AM Medical Record Number: [id number] Patient Account Number: [id number] Date of Birth/Sex: 1923/06/28 (84 y.o. Female) Treating RN: [name] Primary Care Provider: Vonita [name] Other Clinician: Referring Provider: Vonita [name] Treating Provider/Extender: [name], [name] [name] in Treatment: 72 Information Obtained from: Patient Chief Complaint multiple wounds/ulcers Electronic Signature(s) Signed: 06/02/2018 11:11:19 AM By: Tina Kelp PA-C Entered By: [name] on 06/01/2018 10:06:00 [name] ([id number]) -------------------------------------------------------------------------------- HPI Details Patient Name: [name] Date of Service: 06/01/2018 10:00 AM Medical Record Number: [id number] Patient Account Number: [id number] Date of Birth/Sex: 1923/07/23 (84 y.o. Female) Treating RN: [name] Primary Care Provider: Vonita [name] Other Clinician: Referring Provider: Vonita [name] Treating Provider/Extender: [name], [name] [name] in Treatment: 72 History of Present Illness HPI Description: 01/07/17 this is a 84 year old woman admitted to the clinic today for review of a pressure ulcer on her lower sacrum. She is referred from her primary physician's office after being seen on 3/22 with a 3 cm pressure area. Her daughter and caretaker accompanied her today state that the area first became obvious about a month ago and his since deteriorated. They have recently got [name] home health involved and have been using [name] to the wound. They have ordered a pressure relief surface for her mattress. They are turning her religiously. They state that she eats well and they've been forcing fluids on her. She is on a multivitamin. Looking through [facility] point last albumin I see was 4.4 on 10/28. The  patient has advanced parkinsonism which looks superficially like advanced Parkinson's disease although her daughter tells me she did not ever respond to Sinemet therefore this may have another pathology with signs of parkinsonism. However I think this is largely a mute point currently. She also has dementia and is nonambulatory. Since this started they have been keeping her in bed and turning her religiously every 2 hours. She lives at home in [place] with her husband with 24/7 care giving 01/13/17 santyl change qd. Still will require further debridement. continue santyl. 01/20/17; patient's wound actually looks some better less adherent necrotic surface. There is actually visible granulation. We're using [name] 01/27/17; better-looking surface but still a lot of necrotic tissue on the base of this wound. The periwound erythema is better than last week we are still using [name]. Her daughter tells [place] that she is still having trouble with the pressure-relief mattress through medical modalities 02/03/18; I had the patient scheduled for a two week followup however her daughter brought her in early concerned for discoloration on 2 areas of the wound circumference. We have bee using santyl 02/10/17;Better looking surface to the wound. Rim appears better suggesting better offloading. Using santyl 02/24/17; change to Silver Collegen last time. Wound appears better. 03/10/17; still using silver collagen religious offloading. Intake is satisfactory per her daughter. Dimension slightly better 03/12/2017 -- Dr. Jannetta [name] patient who had been seen 2 days ago and was doing fairly well. The patient is brought in by her daughter who noticed a new wound just above the previous wound on her sacral area and going on more to the left lateral side. She was very concerned and we asked her to get in for an opinion. 03/17/17; above is noted. The patient has developed a progressive area to the left of her original wound. This  seems to this started with a ring of red skin  with a more pale interior almost looking fungal. There was a rim of blister through part of the area although this did not look like zoster. They have been applying triamcinolone that was prescribed last week by [name] and the area has a fold to a linear band area which is confluent, erythematous and with obvious epidermal swelling but there is no overt tenderness or crepitus. She has lost some surface epithelium closer to the wound surface itself and now has a more superficial wound in this area and the extending erythema goes towards the left buttock. This is well demarcated between involved in normal skin but once again does not appear to be at all tender. If there is a contact issue here I cannot get the history out of the daughter or the caregiver that are with her. 03/24/17; the patient arrives today with the wound slightly worse slightly more drainage. The bandlike area of erythema that I treated as a possible pineal infection has improved somewhat although proximally is still has confluent erythema without overt tenderness. We have been using silver alginate since the most recent deterioration. To the bandlike degree of erythema we have been using Lotrisone cream 03/31/17; patient arrives today with the wound slightly larger, necrotic surface and surrounding erythema. The bandlike area of erythema that I treated as a possible pineal infection is less swollen but still present I've been using Lotrisone cream on that largely related to the presence of a tinea looking infection when this was first seen. We've been using silver alginate. X-ray that I ordered last week showed no acute bony abnormalities mild fecal impaction. Lab work showed a comprehensive metabolic panel that was normal including an albumin of 3.8. White count was 9.5 hemoglobin 12.3 differential count normal. C-reactive protein was less than 1 and sedimentation rate and 17. The  latter 2 values does not support an ongoing bacterial infection. 04/14/17; patient arrives after a 2 week hiatus. Her wound is not in good condition. Although the base of the wound looks Tina MainlandSHAW, Dominik W. ([id number]) stable she still has an erythematous area that was apparently blistered over the weekend. This again points to the left. As our intake nurse pointed out today this is in the area where the tissue folds together and we may need to prevent try to prevent this. Lab work and x-ray that I did to 3 [name] ago were unremarkable including her albumin nevertheless she is an extremely frail condition physically. We have have been using [name] 04/22/17; I changed her to silver alginate because of the surrounding maceration and moisture last week. The daughter did not like the way the wound looked in the middle of the week and changed her back to MelletteSantyl. They're putting gauze on top of this. Thinks still using Lotrisone. 05/06/17 on evaluation today patient sacral wound appears to be doing okay and does not seem to be any worse. She is having no significant pain during evaluation today the secondary to mental status she is unable to rate or describe whether she had any pain she was not however flinching. Her daughter states that the wound does appear to be looking better to her. Still we are having difficulty with the skinfold that seems to be closing in on itself at this point. All in all I feel like she is making some good progress in the [name] seems to be the official for her. They do tell me that a refill if we are gonna continue that today. No fevers, chills, nausea,  or vomiting noted at this time. 05/13/17 presents today for evaluation concerning her ongoing sacral pressure ulcer. Unfortunately she also has an area of deep tissue injury in the right Ischial region which is starting to show up. The sacral wound also continues to show signs of necrotic tissue overlying and has declined. Overall we  really have not seen a significant improvement in the past several months in regard to the sacral wound and now patient is starting to develop a new wound in the right Ischial region. Obviously this is not trending in the direction that we want to see. No fevers, chills, nausea, or vomiting noted at this time. 05/19/17; I have not seen this wound and almost a month however there is nothing really positive to say about it. Necrotic tissue over the surface which superiorly I think abuts on her sacrum. She has surrounding erythema. I would be surprised if there is not underlying osteomyelitis or soft tissue infection. 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 She apparently eats well per description although I wonder about this looking at her. Lab work I did probably 4 [name] ago however was really quite normal including a serum albumin 05/26/17; culture I did last week grew Escherichia coli and methicillin sensitive staph aureus which should've been well covered by the Augmentin and ciprofloxacin. Indeed the erythema around the wound in the bed of the wound looks somewhat better. Her intake is still satisfactory. They now have a near fluidized bed 06/02/17; they completed the antibiotics last Friday. Using collagen. Daughter still reports eating and drinking well. There is less visualized erythema around the wound. 06/16/17; large pressure ulcer over her lower sacrum and coccyx. Using [name] to the wound bed. 06/30/17; certainly no change in dimensions of this large stage III wound over her sacrum and coccyx. They've been using [name]. There is no exposed bone. She has a candidal/tinea area in the right inguinal area. Other than that her daughter relates that she is eating and drinking well there changing her positioning to make sure the areas offloaded 07/14/17; no major change in the dimensions of this large stage III wound. Initially a smaller wound that became secondarily infected causing  significant tissue breakdown although it is been stable in the last several [name]. Tunneling superiorly at roughly 1:00 no change here either. There is no bone palpable. Both the patient's daughter and caretaker states that she eats well. I have not rechecked her blood work 07/27/17; patient arrives in clinic today and generally a deteriorated looking state. Mild fever with axillary temperature of 100.4. Daughter reports she has not been eating and drinking well since yesterday. She looks more pale and thin and less responsive. We have been using silver collagen to her wound 08/11/17; since the last time the patient was here things have gone better. Her fever went down and she started eating and drinking again. Culture I did of the wound showed methicillin sensitive staph aureus, Morganella and enterococcus. I only treated her with Keflex which would've not covered the Morganella and enterococcus however the purulent area on the 2:00 side of her wound is a lot better and the bandlike erythema that concern me also was resolved. I'd called the daughter last week to confirm that she was a lot better. She finished the Keflex last Thursday she also suffered a skin tear this morning perhaps while putting on her incontinence brief period is on the lateral aspect of her right leg. Clean wound with the surface epithelium not  viable. 08/25/17; last week the patient was noted to have erythema around the wound margin and a slight fever which the patient's daughter says was [id number]. Our office was contacted by home health however we did not have a space to work the patient in that she went to see her primary physician Dr. Maurice MarchLane. She was not febrile during this visit on 08/21/17 there was erythema around the wound similar to last occasion. Dr. Maurice MarchLane in reference to my culture from 07/28/17 and put her on doxycycline capsules which they're opening twice a day for 10 days. 09/17/17; patient arrives today with the wound  bed looking fairly well granulated. There is undermining from 4 to 6:00 although this seems to of contracted slightly. She does not have obvious infection although the daughter states there was some darkening of the wound circumference that is not evident today. They state she is eating well. They are concerned about oral thrush 09/30/16; very fibrin looking granulated wound bed. Her undermining from 4 to 6:00 is about the same but also appears to be well granulated. She has rolled edges of senescent tissue from roughly 7 to 12:00. There is no evidence of infection Tina MainlandSHAW, Bahja W. ([id number]) 10/13/17 on evaluation today patient appears to be doing fairly well all things considered in regard to her sacral wound. There's really not a lot of significant change or improvement she does have some evidence of contusion and deep tissue injury around the left border of the wound that patient's daughter did inquire about today. Nonetheless overall the wound appears to be doing about the same in my opinion. There is no significant indication of infection there also is no significant slough noted at this point. 10/27/17 She is here in follow up evaluation of a sacral ulcer. She is accompanied by her daughter and caregiver. There is red granulation tissue throughout, persistent discoloration to left border; this appears consistent with deep tissue injury. There are multiple areas covered in foam borders, tegaderm, etc that are "preventative" with "no wounds". We will continue with prisma and continue with two week follow ups 11/17/17 on evaluation today patient appears to be doing decently well in regard to her wound at this point. She continues to have a sacral wound ulcer. We see her roughly every two [name]. In the last week she was actually in the hospital due to what was diagnosed as sepsis although no organisms were ever identified in the actual blood cultures. The hospital really was not sure that the  wound was the cause of the infection but they really did not figure out anything else that would be a causative organism she did have a CT scan and that revealed no evidence of pneumonia there was also no evidence of urinary tract infection. Again it very well could have been the wound called in those although wound appears to be doing fairly well at this point. 12/15/17 on evaluation today patient actually appears to be doing about the same in regard to her sacral ulcer. The one thing different is that she does seem to have a rash where her right arm is contracted and being held to the thorax. The area underlying both on the ventral side of the arm as well is the thorax where it comes in contact shows evidence of a rash which appears to be fungal in nature. There does not appear to be any evidence of infection lies at this point. There does not appear to be a rash consistent with shingles which was  also of concern initially. No fevers, chills, nausea, or vomiting noted at this time. 12/29/17 on evaluation today patient appears to be doing a little better in regard to her sacral wound although the one changes the 12 o'clock location of the wound seems to have attached at one point which no longer allows this to pull back. This has caused an area of undermining that seems to be attaching as well in the 12 o'clock location. I do think that this is something that can be managed and is not necessarily a bad thing. Nonetheless she does seem to have some pain with exploration of this region of undermining. She continues to have an area under her right arm on the chest wall of erythema although this is a little better especially after the home health nurse is actually got in order for Diflucan times one for the patient. She is a new area on the left wrist that looks like because of the contraction her nail may have pushed in on her wrist area causing a slight cut which subsequently became infected. She has  some honey crusted drainage noted. 03/09/18 undervaluation today patient appears to be doing fairly well in regard to her wounds in general. She has been tolerating the dressing changes without complication. Overall I'm pleased with the progress that seems to be made week by week especially in regard to the sacral ulcer. Her daughter and the caregiver seem to take very good care of her. With that being said she has very little undermining in regard to the sacral wound in good granulation she also has good epithelialization noted. 03/23/18 on evaluation today patient appears to be doing rather well in regard to her sacral wound. Unfortunately she does have a little bit of necrotic tissue noted in the central portion of her wound on the left elbow. This is due to the fact that she is lying on this arm seeing how it is contracted. Currently her daughter has started to avoid lying on the side it all due to the fact that the necrotic tissue was noted. With that being said they have been taking very good care of her in my pinion. Fortunately there does not appear to be any evidence of infection which is good news. Overall I'm pleased with the progress she's made other than in regard to the elbow. 04/06/18 on evaluation today patient actually appears to be doing okay in regard to the sacral wound. In fact it appears to be somewhat smaller in general although there does appear to be more undermining at the 6 o'clock location than was previously noted. She has been tolerating the dressing changes without complication in general which is also good news. Nonetheless she does have a new small skin tear/opening on her leg which was evaluated today. Fortunately this appears to be minimal and the Xeroform gauze which has been used up to this point in the past has been utilized very effectively seems to be doing well in that regard. Her left elbow ulcer seems to also be doing excellent at this point making good  progress. 04/20/18 on evaluation today patient appears to be doing fairly well in regard to her sacral wound. This definitely seems to be better than during last evaluation where it was indeed infected. With that being said she has been tolerating the dressing changes as best it can be expected. Her elbow seems to be drying out and a lot of times the dressing is getting stuck according to her caregiver. Thornberry,  Myles Gip ([id number]) 05/04/18-She is seen in follow-up evaluation for a sacral and left elbow pressure ulcer. These are stable/improved and we will continue with same treatment plan and she will follow-up in 2 [name] 05/18/18 on evaluation today patient actually appears to be doing better in regard to her left elbow ulcer. The sacral wound is also shown signs of improvement which is good news. With that being said she does have a new right medial lower extremity ulcer which actually appears to show some signs of cellulitis/infection. She is previously taken Augmentin with good result. Nonetheless the patient really does not have anything that I can culture at the site there's actually some good epithelialization noted although again there is some cellulitis appearance as well. 06/01/18 on evaluation today patient appears to actually be doing very well in general in regard to her left elbow wound which is smaller in her lower extremity ulcer which is actually healed. With that being said the sacral wound in particular show signs of improvement although she still has undermining in the six-7 o'clock location. No fevers, chills, nausea, or vomiting noted at this time. Electronic Signature(s) Signed: 06/02/2018 11:11:19 AM By: Tina Kelp PA-C Entered By: [name] on 06/01/2018 10:45:37 [name] ([id number]) -------------------------------------------------------------------------------- Physical Exam Details Patient Name: [name] Date of Service: 06/01/2018 10:00  AM Medical Record Number: [id number] Patient Account Number: [id number] Date of Birth/Sex: 05/06/1923 (84 y.o. Female) Treating RN: [name] Primary Care Provider: Vonita [name] Other Clinician: Referring Provider: Vonita [name] Treating Provider/Extender: STONE III, [name] [name] in Treatment: 72 Constitutional Well-nourished and well-hydrated in no acute distress. Respiratory normal breathing without difficulty. clear to auscultation bilaterally. Cardiovascular regular rate and rhythm with normal S1, S2. Psychiatric Patient is not able to cooperate in decision making regarding care. Patient has dementia. patient is confused. Notes Patient sacral wound actually showing signs of good improvement at this time no sharp debridement was necessary. She does continue to have some undermining at the six-7 o'clock location other than this region of undermining everything else appears to actually be doing rather well at this point. The wound has definitely diminished in size. Electronic Signature(s) Signed: 06/02/2018 11:11:19 AM By: Tina Kelp PA-C Entered By: [name] on 06/01/2018 10:56:23 [name] ([id number]) -------------------------------------------------------------------------------- Physician Orders Details Patient Name: [name] Date of Service: 06/01/2018 10:00 AM Medical Record Number: [id number] Patient Account Number: [id number] Date of Birth/Sex: [birth year] (84 y.o. Female) Treating RN: [name] Primary Care Provider: Vonita [name] Other Clinician: Referring Provider: Vonita [name] Treating Provider/Extender: [name], [name] [name] in Treatment: 27 Verbal / Phone Orders: No Diagnosis Coding ICD-10 Coding Code Description L89.153 Pressure ulcer of sacral region, stage 3 L89.023 Pressure ulcer of left elbow, stage 3 G20 Parkinson's disease L03.312 Cellulitis of back [any part except buttock] S81.811D Laceration without foreign body,  right lower leg, subsequent encounter Wound Cleansing Wound #1 Midline Coccyx o Clean wound with Normal Saline. o May Shower, gently pat wound dry prior to applying new dressing. Wound #9 Left Elbow o Clean wound with Normal Saline. o May Shower, gently pat wound dry prior to applying new dressing. Anesthetic (add to Medication List) Wound #1 Midline Coccyx o Topical Lidocaine 4% cream applied to wound bed prior to debridement (In Clinic Only). Wound #9 Left Elbow o Topical Lidocaine 4% cream applied to wound bed prior to debridement (In Clinic Only). Skin Barriers/Peri-Wound Care Wound #1 Midline Coccyx o Skin Prep - use only where the  allevyn sticks not on reddened areas o Antifungal powder-Nystatin - around peri-wound on coccyx (do not get in wound), under right breast and right arm, right abdomen Primary Wound Dressing Wound #1 Midline Coccyx o Silver Alginate - silvercel Ag non-adherent o Silver Collagen - prisma ag and pack into undermining as well slightly moisten with hydrogel or KY Jelly and then place silvercel Ag non-adherent on top of the silver collagen Wound #9 Left Elbow o Xeroform Secondary Dressing Wound #1 Midline Coccyx [place], [place]. ([id number]) o Boardered Foam Dressing - HHRN PLEASE ORDER ALLEVYN LIFE BORDERED FOAM FOR PATIENT (OFF BRANDS IRRITATE HER SKIN) Wound #9 Left Elbow o Foam o Non-adherent pad o Other - stretch netting #4 Dressing Change Frequency Wound #1 Midline Coccyx o Change dressing every day. Wound #9 Left Elbow o Change dressing every day. Follow-up Appointments o Return Appointment in 2 [name]. Off-Loading Wound #1 Midline Coccyx o Roho cushion for wheelchair o Mattress - fluidized air mattress o Turn and reposition every 2 hours Additional Orders / Instructions Wound #1 Midline Coccyx o Increase protein intake. - please add protein supplements to patients diet o Other: - please  add vitamin A, vitamin C and zinc supplements to patients diet Wound #9 Left Elbow o Increase protein intake. - please add protein supplements to patients diet o Other: - please add vitamin A, vitamin C and zinc supplements to patients diet Home Health Wound #1 Midline Coccyx o Continue Home Health Visits - [facility] o Home Health Nurse may visit PRN to address patientos wound care needs. o FACE TO FACE ENCOUNTER: MEDICARE and MEDICAID PATIENTS: I certify that this patient is under my care and that I had a face-to-face encounter that meets the physician face-to-face encounter requirements with this patient on this date. The encounter with the patient was in whole or in part for the following MEDICAL CONDITION: (primary reason for Home Healthcare) MEDICAL NECESSITY: I certify, that based on my findings, NURSING services are a medically necessary home health service. HOME BOUND STATUS: I certify that my clinical findings support that this patient is homebound (i.e., Due to illness or injury, pt requires aid of supportive devices such as crutches, cane, wheelchairs, walkers, the use of special transportation or the assistance of another person to leave their place of residence. There is a normal inability to leave the home and doing so requires considerable and taxing effort. Other absences are for medical reasons / religious services and are infrequent or of short duration when for other reasons). o If current dressing causes regression in wound condition, may D/C ordered dressing product/s and apply Normal Saline Moist Dressing daily until next Wound Healing Center / Other MD appointment. Notify Wound Healing Center of regression in wound condition at [phone]. o Please direct any NON-WOUND related issues/requests for orders to patient's Primary Care Physician - Dr [name] Wound #9 Left Elbow o Continue Home Health Visits - [facility] o Home Health Nurse may visit PRN  to address patientos wound care needs. KETZALY, [name] ([id number]) o FACE TO FACE ENCOUNTER: MEDICARE and MEDICAID PATIENTS: I certify that this patient is under my care and that I had a face-to-face encounter that meets the physician face-to-face encounter requirements with this patient on this date. The encounter with the patient was in whole or in part for the following MEDICAL CONDITION: (primary reason for Home Healthcare) MEDICAL NECESSITY: I certify, that based on my findings, NURSING services are a medically necessary home health service. HOME BOUND STATUS: I  certify that my clinical findings support that this patient is homebound (i.e., Due to illness or injury, pt requires aid of supportive devices such as crutches, cane, wheelchairs, walkers, the use of special transportation or the assistance of another person to leave their place of residence. There is a normal inability to leave the home and doing so requires considerable and taxing effort. Other absences are for medical reasons / religious services and are infrequent or of short duration when for other reasons). o If current dressing causes regression in wound condition, may D/C ordered dressing product/s and apply Normal Saline Moist Dressing daily until next Wound Healing Center / Other MD appointment. Notify Wound Healing Center of regression in wound condition at [phone]. o Please direct any NON-WOUND related issues/requests for orders to patient's Primary Care Physician - Dr [name] Electronic Signature(s) Signed: 06/01/2018 5:51:08 PM By: [name] Signed: 06/02/2018 11:11:19 AM By: Tina Kelp PA-C Entered By: [name] on 06/01/2018 10:37:04 [name] ([id number]) -------------------------------------------------------------------------------- Problem List Details Patient Name: [name] Date of Service: 06/01/2018 10:00 AM Medical Record Number: [id number] Patient Account  Number: [id number] Date of Birth/Sex: 09/22/1923 (84 y.o. Female) Treating RN: [name] Primary Care Provider: Vonita [name] Other Clinician: Referring Provider: Vonita [name] Treating Provider/Extender: [name], [name] [name] in Treatment: 72 Active Problems ICD-10 Evaluated Encounter Code Description Active Date Today Diagnosis L89.153 Pressure ulcer of sacral region, stage 3 01/07/2017 No Yes L89.023 Pressure ulcer of left elbow, stage 3 03/23/2018 No Yes G20 Parkinson's disease 01/07/2017 No Yes L03.312 Cellulitis of back [any part except buttock] 07/28/2017 No Yes S81.811D Laceration without foreign body, right lower leg, subsequent 08/11/2017 No Yes encounter Inactive Problems Resolved Problems Electronic Signature(s) Signed: 06/02/2018 11:11:19 AM By: Tina Kelp PA-C Entered By: [name] on 06/01/2018 10:05:49 [name] ([id number]) -------------------------------------------------------------------------------- Progress Note Details Patient Name: [name] Date of Service: 06/01/2018 10:00 AM Medical Record Number: [id number] Patient Account Number: [id number] Date of Birth/Sex: 1923/04/09 (84 y.o. Female) Treating RN: [name] Primary Care Provider: Vonita [name] Other Clinician: Referring Provider: Vonita [name] Treating Provider/Extender: [name], [name] [name] in Treatment: 72 Subjective Chief Complaint Information obtained from Patient multiple wounds/ulcers History of Present Illness (HPI) 01/07/17 this is a 84 year old woman admitted to the clinic today for review of a pressure ulcer on her lower sacrum. She is referred from her primary physician's office after being seen on 3/22 with a 3 cm pressure area. Her daughter and caretaker accompanied her today state that the area first became obvious about a month ago and his since deteriorated. They have recently got [name] home health involved and have been using [name] to the  wound. They have ordered a pressure relief surface for her mattress. They are turning her religiously. They state that she eats well and they've been forcing fluids on her. She is on a multivitamin. Looking through [facility] point last albumin I see was 4.4 on 10/28. The patient has advanced parkinsonism which looks superficially like advanced Parkinson's disease although her daughter tells me she did not ever respond to Sinemet therefore this may have another pathology with signs of parkinsonism. However I think this is largely a mute point currently. She also has dementia and is nonambulatory. Since this started they have been keeping her in bed and turning her religiously every 2 hours. She lives at home in [place] with her husband with 24/7 care giving 01/13/17 santyl change qd. Still will require further debridement.  continue santyl. 01/20/17; patient's wound actually looks some better less adherent necrotic surface. There is actually visible granulation. We're using [name] 01/27/17; better-looking surface but still a lot of necrotic tissue on the base of this wound. The periwound erythema is better than last week we are still using [name]. Her daughter tells [place] that she is still having trouble with the pressure-relief mattress through medical modalities 02/03/18; I had the patient scheduled for a two week followup however her daughter brought her in early concerned for discoloration on 2 areas of the wound circumference. We have bee using santyl 02/10/17;Better looking surface to the wound. Rim appears better suggesting better offloading. Using santyl 02/24/17; change to Silver [place] last time. Wound appears better. 03/10/17; still using silver collagen religious offloading. Intake is satisfactory per her daughter. Dimension slightly better 03/12/2017 -- Dr. Jannetta [name] patient who had been seen 2 days ago and was doing fairly well. The patient is brought in by her daughter who noticed a new  wound just above the previous wound on her sacral area and going on more to the left lateral side. She was very concerned and we asked her to get in for an opinion. 03/17/17; above is noted. The patient has developed a progressive area to the left of her original wound. This seems to this started with a ring of red skin with a more pale interior almost looking fungal. There was a rim of blister through part of the area although this did not look like zoster. They have been applying triamcinolone that was prescribed last week by Dr. Meyer Russel and the area has a fold to a linear band area which is confluent, erythematous and with obvious epidermal swelling but there is no overt tenderness or crepitus. She has lost some surface epithelium closer to the wound surface itself and now has a more superficial wound in this area and the extending erythema goes towards the left buttock. This is well demarcated between involved in normal skin but once again does not appear to be at all tender. If there is a contact issue here I cannot get the history out of the daughter or the caregiver that are with her. 03/24/17; the patient arrives today with the wound slightly worse slightly more drainage. The bandlike area of erythema that I treated as a possible pineal infection has improved somewhat although proximally is still has confluent erythema without overt tenderness. We have been using silver alginate since the most recent deterioration. To the bandlike degree of erythema we have been using Lotrisone cream 03/31/17; patient arrives today with the wound slightly larger, necrotic surface and surrounding erythema. The bandlike area of erythema that I treated as a possible pineal infection is less swollen but still present I've been using Lotrisone cream on that largely related to the presence of a tinea looking infection when this was first seen. We've been using silver alginate. CHARELL, FAULK ([id number]) X-ray that  I ordered last week showed no acute bony abnormalities mild fecal impaction. Lab work showed a comprehensive metabolic panel that was normal including an albumin of 3.8. White count was 9.5 hemoglobin 12.3 differential count normal. C-reactive protein was less than 1 and sedimentation rate and 17. The latter 2 values does not support an ongoing bacterial infection. 04/14/17; patient arrives after a 2 week hiatus. Her wound is not in good condition. Although the base of the wound looks stable she still has an erythematous area that was apparently blistered over the weekend. This again points to  the left. As our intake nurse pointed out today this is in the area where the tissue folds together and we may need to prevent try to prevent this. Lab work and x-ray that I did to 3 [name] ago were unremarkable including her albumin nevertheless she is an extremely frail condition physically. We have have been using [name] 04/22/17; I changed her to silver alginate because of the surrounding maceration and moisture last week. The daughter did not like the way the wound looked in the middle of the week and changed her back to [place]. They're putting gauze on top of this. Thinks still using Lotrisone. 05/06/17 on evaluation today patient sacral wound appears to be doing okay and does not seem to be any worse. She is having no significant pain during evaluation today the secondary to mental status she is unable to rate or describe whether she had any pain she was not however flinching. Her daughter states that the wound does appear to be looking better to her. Still we are having difficulty with the skinfold that seems to be closing in on itself at this point. All in all I feel like she is making some good progress in the [name] seems to be the official for her. They do tell me that a refill if we are gonna continue that today. No fevers, chills, nausea, or vomiting noted at this time. 05/13/17 presents today for  evaluation concerning her ongoing sacral pressure ulcer. Unfortunately she also has an area of deep tissue injury in the right Ischial region which is starting to show up. The sacral wound also continues to show signs of necrotic tissue overlying and has declined. Overall we really have not seen a significant improvement in the past several months in regard to the sacral wound and now patient is starting to develop a new wound in the right Ischial region. Obviously this is not trending in the direction that we want to see. No fevers, chills, nausea, or vomiting noted at this time. 05/19/17; I have not seen this wound and almost a month however there is nothing really positive to say about it. Necrotic tissue over the surface which superiorly I think abuts on her sacrum. She has surrounding erythema. I would be surprised if there is not underlying osteomyelitis or soft tissue infection. 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 She apparently eats well per description although I wonder about this looking at her. Lab work I did probably 4 [name] ago however was really quite normal including a serum albumin 05/26/17; culture I did last week grew Escherichia coli and methicillin sensitive staph aureus which should've been well covered by the Augmentin and ciprofloxacin. Indeed the erythema around the wound in the bed of the wound looks somewhat better. Her intake is still satisfactory. They now have a near fluidized bed 06/02/17; they completed the antibiotics last Friday. Using collagen. Daughter still reports eating and drinking well. There is less visualized erythema around the wound. 06/16/17; large pressure ulcer over her lower sacrum and coccyx. Using [name] to the wound bed. 06/30/17; certainly no change in dimensions of this large stage III wound over her sacrum and coccyx. They've been using [name]. There is no exposed bone. She has a candidal/tinea area in the right inguinal area. Other  than that her daughter relates that she is eating and drinking well there changing her positioning to make sure the areas offloaded 07/14/17; no major change in the dimensions of this large stage III wound.  Initially a smaller wound that became secondarily infected causing significant tissue breakdown although it is been stable in the last several [name]. Tunneling superiorly at roughly 1:00 no change here either. There is no bone palpable. Both the patient's daughter and caretaker states that she eats well. I have not rechecked her blood work 07/27/17; patient arrives in clinic today and generally a deteriorated looking state. Mild fever with axillary temperature of 100.4. Daughter reports she has not been eating and drinking well since yesterday. She looks more pale and thin and less responsive. We have been using silver collagen to her wound 08/11/17; since the last time the patient was here things have gone better. Her fever went down and she started eating and drinking again. Culture I did of the wound showed methicillin sensitive staph aureus, Morganella and enterococcus. I only treated her with Keflex which would've not covered the Morganella and enterococcus however the purulent area on the 2:00 side of her wound is a lot better and the bandlike erythema that concern me also was resolved. I'd called the daughter last week to confirm that she was a lot better. She finished the Keflex last Thursday she also suffered a skin tear this morning perhaps while putting on her incontinence brief period is on the lateral aspect of her right leg. Clean wound with the surface epithelium not viable. 08/25/17; last week the patient was noted to have erythema around the wound margin and a slight fever which the patient's daughter says was 20. Our office was contacted by home health however we did not have a space to work the patient in that she went to see her primary physician Dr. Maurice March. She was not febrile  during this visit on 08/21/17 there was erythema around the wound similar to last occasion. Dr. Maurice March in reference to my culture from 07/28/17 and put her on doxycycline capsules which they're opening twice a day for 10 days. KELSEE, PRESLAR ([id number]) 09/17/17; patient arrives today with the wound bed looking fairly well granulated. There is undermining from 4 to 6:00 although this seems to of contracted slightly. She does not have obvious infection although the daughter states there was some darkening of the wound circumference that is not evident today. They state she is eating well. They are concerned about oral thrush 09/30/16; very fibrin looking granulated wound bed. Her undermining from 4 to 6:00 is about the same but also appears to be well granulated. She has rolled edges of senescent tissue from roughly 7 to 12:00. There is no evidence of infection 10/13/17 on evaluation today patient appears to be doing fairly well all things considered in regard to her sacral wound. There's really not a lot of significant change or improvement she does have some evidence of contusion and deep tissue injury around the left border of the wound that patient's daughter did inquire about today. Nonetheless overall the wound appears to be doing about the same in my opinion. There is no significant indication of infection there also is no significant slough noted at this point. 10/27/17 She is here in follow up evaluation of a sacral ulcer. She is accompanied by her daughter and caregiver. There is red granulation tissue throughout, persistent discoloration to left border; this appears consistent with deep tissue injury. There are multiple areas covered in foam borders, tegaderm, etc that are "preventative" with "no wounds". We will continue with prisma and continue with two week follow ups 11/17/17 on evaluation today patient appears to be doing decently  well in regard to her wound at this point. She  continues to have a sacral wound ulcer. We see her roughly every two [name]. In the last week she was actually in the hospital due to what was diagnosed as sepsis although no organisms were ever identified in the actual blood cultures. The hospital really was not sure that the wound was the cause of the infection but they really did not figure out anything else that would be a causative organism she did have a CT scan and that revealed no evidence of pneumonia there was also no evidence of urinary tract infection. Again it very well could have been the wound called in those although wound appears to be doing fairly well at this point. 12/15/17 on evaluation today patient actually appears to be doing about the same in regard to her sacral ulcer. The one thing different is that she does seem to have a rash where her right arm is contracted and being held to the thorax. The area underlying both on the ventral side of the arm as well is the thorax where it comes in contact shows evidence of a rash which appears to be fungal in nature. There does not appear to be any evidence of infection lies at this point. There does not appear to be a rash consistent with shingles which was also of concern initially. No fevers, chills, nausea, or vomiting noted at this time. 12/29/17 on evaluation today patient appears to be doing a little better in regard to her sacral wound although the one changes the 12 o'clock location of the wound seems to have attached at one point which no longer allows this to pull back. This has caused an area of undermining that seems to be attaching as well in the 12 o'clock location. I do think that this is something that can be managed and is not necessarily a bad thing. Nonetheless she does seem to have some pain with exploration of this region of undermining. She continues to have an area under her right arm on the chest wall of erythema although this is a little better especially after  the home health nurse is actually got in order for Diflucan times one for the patient. She is a new area on the left wrist that looks like because of the contraction her nail may have pushed in on her wrist area causing a slight cut which subsequently became infected. She has some honey crusted drainage noted. 03/09/18 undervaluation today patient appears to be doing fairly well in regard to her wounds in general. She has been tolerating the dressing changes without complication. Overall I'm pleased with the progress that seems to be made week by week especially in regard to the sacral ulcer. Her daughter and the caregiver seem to take very good care of her. With that being said she has very little undermining in regard to the sacral wound in good granulation she also has good epithelialization noted. 03/23/18 on evaluation today patient appears to be doing rather well in regard to her sacral wound. Unfortunately she does have a little bit of necrotic tissue noted in the central portion of her wound on the left elbow. This is due to the fact that she is lying on this arm seeing how it is contracted. Currently her daughter has started to avoid lying on the side it all due to the fact that the necrotic tissue was noted. With that being said they have been taking very good care of  her in my pinion. Fortunately there does not appear to be any evidence of infection which is good news. Overall I'm pleased with the progress she's made other than in regard to the elbow. 04/06/18 on evaluation today patient actually appears to be doing okay in regard to the sacral wound. In fact it appears to be somewhat smaller in general although there does appear to be more undermining at the 6 o'clock location than was previously noted. She has been tolerating the dressing changes without complication in general which is also good news. Nonetheless she does have a new small skin tear/opening on her leg which was evaluated  today. Fortunately this appears to be minimal and the Xeroform gauze which has been used up to this point in the past has been utilized very effectively seems CANDI, PROFIT. ([id number]) to be doing well in that regard. Her left elbow ulcer seems to also be doing excellent at this point making good progress. 04/20/18 on evaluation today patient appears to be doing fairly well in regard to her sacral wound. This definitely seems to be better than during last evaluation where it was indeed infected. With that being said she has been tolerating the dressing changes as best it can be expected. Her elbow seems to be drying out and a lot of times the dressing is getting stuck according to her caregiver. 05/04/18-She is seen in follow-up evaluation for a sacral and left elbow pressure ulcer. These are stable/improved and we will continue with same treatment plan and she will follow-up in 2 [name] 05/18/18 on evaluation today patient actually appears to be doing better in regard to her left elbow ulcer. The sacral wound is also shown signs of improvement which is good news. With that being said she does have a new right medial lower extremity ulcer which actually appears to show some signs of cellulitis/infection. She is previously taken Augmentin with good result. Nonetheless the patient really does not have anything that I can culture at the site there's actually some good epithelialization noted although again there is some cellulitis appearance as well. 06/01/18 on evaluation today patient appears to actually be doing very well in general in regard to her left elbow wound which is smaller in her lower extremity ulcer which is actually healed. With that being said the sacral wound in particular show signs of improvement although she still has undermining in the six-7 o'clock location. No fevers, chills, nausea, or vomiting noted at this time. Patient History Unable to Obtain Patient History due to Altered  Mental Status. Information obtained from Patient. Social History Never smoker, Marital Status - Married, Alcohol Use - Never, Drug Use - No History, Caffeine Use - Never. Medical And Surgical History Notes Ear/Nose/Mouth/Throat dysphagia Neurologic parkinsons, tremor Review of Systems (ROS) Constitutional Symptoms (General Health) Denies complaints or symptoms of Fever, Chills. Respiratory The patient has no complaints or symptoms. Cardiovascular The patient has no complaints or symptoms. Psychiatric The patient has no complaints or symptoms. Objective Constitutional Well-nourished and well-hydrated in no acute distress. Vitals Time Taken: 10:07 AM, Height: 60 in, Weight: 90 lbs, BMI: 17.6, Temperature: 97.8 F, Pulse: 59 bpm, Respiratory [place], Lunetta W. ([id number]) Rate: 14 breaths/min, Blood Pressure: 131/33 mmHg. Respiratory normal breathing without difficulty. clear to auscultation bilaterally. Cardiovascular regular rate and rhythm with normal S1, S2. Psychiatric Patient is not able to cooperate in decision making regarding care. Patient has dementia. patient is confused. General Notes: Patient sacral wound actually showing signs of good improvement at  this time no sharp debridement was necessary. She does continue to have some undermining at the six-7 o'clock location other than this region of undermining everything else appears to actually be doing rather well at this point. The wound has definitely diminished in size. Integumentary (Hair, Skin) Wound #1 status is Open. Original cause of wound was Pressure Injury. The wound is located on the Midline Coccyx. The wound measures 2.5cm length x 0.8cm width x 0.4cm depth; 1.571cm^2 area and 0.628cm^3 volume. There is muscle and Fat Layer (Subcutaneous Tissue) Exposed exposed. There is no tunneling noted, however, there is undermining starting at 6:00 and ending at 7:00 with a maximum distance of 1.2cm. There is a large  amount of serosanguineous drainage noted. The wound margin is flat and intact. There is large (67-100%) red, hyper - granulation within the wound bed. There is no necrotic tissue within the wound bed. The periwound skin appearance exhibited: Induration, Rash, Maceration. The periwound skin appearance did not exhibit: Callus, Crepitus, Excoriation, Scarring, Dry/Scaly, Atrophie Blanche, Cyanosis, Ecchymosis, Hemosiderin Staining, Mottled, Pallor, Rubor, Erythema. Periwound temperature was noted as No Abnormality. The periwound has tenderness on palpation. Wound #12 status is Healed - Epithelialized. Original cause of wound was Trauma. The wound is located on the Right Upper Leg. The wound measures 0cm length x 0cm width x 0cm depth; 0cm^2 area and 0cm^3 volume. There is no tunneling or undermining noted. There is a none present amount of drainage noted. The wound margin is flat and intact. There is no granulation within the wound bed. There is no necrotic tissue within the wound bed. The periwound skin appearance did not exhibit: Callus, Crepitus, Excoriation, Induration, Rash, Scarring, Dry/Scaly, Maceration, Atrophie Blanche, Cyanosis, Ecchymosis, Hemosiderin Staining, Mottled, Pallor, Rubor, Erythema. Periwound temperature was noted as No Abnormality. Wound #9 status is Open. Original cause of wound was Trauma. The wound is located on the Left Elbow. The wound measures 0.7cm length x 0.5cm width x 0.1cm depth; 0.275cm^2 area and 0.027cm^3 volume. There is no tunneling or undermining noted. There is a small amount of serous drainage noted. The wound margin is distinct with the outline attached to the wound base. There is large (67-100%) red, pink granulation within the wound bed. There is a small (1-33%) amount of necrotic tissue within the wound bed. The periwound skin appearance exhibited: Dry/Scaly. The periwound skin appearance did not exhibit: Erythema. Periwound temperature was noted as No  Abnormality. The periwound has tenderness on palpation. Assessment Active Problems ICD-10 Pressure ulcer of sacral region, stage 3 Pressure ulcer of left elbow, stage 3 Parkinson's disease Cellulitis of back [any part except buttock] Laceration without foreign body, right lower leg, subsequent encounter KIMMY, TOTTEN ([id number]) Plan Wound Cleansing: Wound #1 Midline Coccyx: Clean wound with Normal Saline. May Shower, gently pat wound dry prior to applying new dressing. Wound #9 Left Elbow: Clean wound with Normal Saline. May Shower, gently pat wound dry prior to applying new dressing. Anesthetic (add to Medication List): Wound #1 Midline Coccyx: Topical Lidocaine 4% cream applied to wound bed prior to debridement (In Clinic Only). Wound #9 Left Elbow: Topical Lidocaine 4% cream applied to wound bed prior to debridement (In Clinic Only). Skin Barriers/Peri-Wound Care: Wound #1 Midline Coccyx: Skin Prep - use only where the allevyn sticks not on reddened areas Antifungal powder-Nystatin - around peri-wound on coccyx (do not get in wound), under right breast and right arm, right abdomen Primary Wound Dressing: Wound #1 Midline Coccyx: Silver Alginate - silvercel Ag non-adherent Silver  Collagen - prisma ag and pack into undermining as well slightly moisten with hydrogel or KY Jelly and then place silvercel Ag non-adherent on top of the silver collagen Wound #9 Left Elbow: Xeroform Secondary Dressing: Wound #1 Midline Coccyx: Boardered Foam Dressing - HHRN PLEASE ORDER ALLEVYN LIFE BORDERED FOAM FOR PATIENT (OFF BRANDS IRRITATE HER SKIN) Wound #9 Left Elbow: Foam Non-adherent pad Other - stretch netting #4 Dressing Change Frequency: Wound #1 Midline Coccyx: Change dressing every day. Wound #9 Left Elbow: Change dressing every day. Follow-up Appointments: Return Appointment in 2 [name]. Off-Loading: Wound #1 Midline Coccyx: Roho cushion for wheelchair Mattress -  fluidized air mattress Turn and reposition every 2 hours Additional Orders / Instructions: Wound #1 Midline Coccyx: Increase protein intake. - please add protein supplements to patients diet Other: - please add vitamin A, vitamin C and zinc supplements to patients diet Wound #9 Left Elbow: Increase protein intake. - please add protein supplements to patients diet Other: - please add vitamin A, vitamin C and zinc supplements to patients diet Home Health: Wound #1 Midline Coccyx: [facility] Visits - JAMERIA, BRADWAY ([id number]) Home Health Nurse may visit PRN to address patient s wound care needs. FACE TO FACE ENCOUNTER: MEDICARE and MEDICAID PATIENTS: I certify that this patient is under my care and that I had a face-to-face encounter that meets the physician face-to-face encounter requirements with this patient on this date. The encounter with the patient was in whole or in part for the following MEDICAL CONDITION: (primary reason for Home Healthcare) MEDICAL NECESSITY: I certify, that based on my findings, NURSING services are a medically necessary home health service. HOME BOUND STATUS: I certify that my clinical findings support that this patient is homebound (i.e., Due to illness or injury, pt requires aid of supportive devices such as crutches, cane, wheelchairs, walkers, the use of special transportation or the assistance of another person to leave their place of residence. There is a normal inability to leave the home and doing so requires considerable and taxing effort. Other absences are for medical reasons / religious services and are infrequent or of short duration when for other reasons). If current dressing causes regression in wound condition, may D/C ordered dressing product/s and apply Normal Saline Moist Dressing daily until next Wound Healing Center / Other MD appointment. Notify Wound Healing Center of regression in wound condition at  [phone]. Please direct any NON-WOUND related issues/requests for orders to patient's Primary Care Physician - Dr [name] Wound #9 Left Elbow: Continue Home Health Visits - [facility] Nurse may visit PRN to address patient s wound care needs. FACE TO FACE ENCOUNTER: MEDICARE and MEDICAID PATIENTS: I certify that this patient is under my care and that I had a face-to-face encounter that meets the physician face-to-face encounter requirements with this patient on this date. The encounter with the patient was in whole or in part for the following MEDICAL CONDITION: (primary reason for Home Healthcare) MEDICAL NECESSITY: I certify, that based on my findings, NURSING services are a medically necessary home health service. HOME BOUND STATUS: I certify that my clinical findings support that this patient is homebound (i.e., Due to illness or injury, pt requires aid of supportive devices such as crutches, cane, wheelchairs, walkers, the use of special transportation or the assistance of another person to leave their place of residence. There is a normal inability to leave the home and doing so requires considerable and taxing effort. Other absences are for medical  reasons / religious services and are infrequent or of short duration when for other reasons). If current dressing causes regression in wound condition, may D/C ordered dressing product/s and apply Normal Saline Moist Dressing daily until next Wound Healing Center / Other MD appointment. Notify Wound Healing Center of regression in wound condition at [phone]. Please direct any NON-WOUND related issues/requests for orders to patient's Primary Care Physician - Dr [name] Brion Aliment consider at this point in time continuing with the Current wound care measures one thing we will change is use a little bit of the silver cell to pack into the 6 o'clock tunnel/undermining region. The collagen will first be placed into this  region and then the silver alginate tucked in behind it to hold it down to the base and help control fluid and then laid over top of the wound. We'll see how this does over the next two [name]. Please see above for specific wound care orders. We will see patient for re-evaluation in 2 week(s) here in the clinic. If anything worsens or changes patient will contact our office for additional recommendations. Electronic Signature(s) Signed: 06/02/2018 11:11:19 AM By: Tina Kelp PA-C Entered By: [name] on 06/01/2018 10:57:11 [name] ([id number]) -------------------------------------------------------------------------------- ROS/PFSH Details Patient Name: [name] Date of Service: 06/01/2018 10:00 AM Medical Record Number: [id number] Patient Account Number: [id number] Date of Birth/Sex: 1922/12/29 (84 y.o. Female) Treating RN: [name] Primary Care Provider: Vonita [name] Other Clinician: Referring Provider: Vonita [name] Treating Provider/Extender: [name], [name] [name] in Treatment: 51 Unable to Obtain Patient History due to oo Altered Mental Status Information Obtained From Patient Wound History Do you currently have one or more open woundso Yes How many open wounds do you currently haveo 1 Approximately how long have you had your woundso 1 week How have you been treating your wound(s) until nowo santyl and gauze Has your wound(s) ever healed and then re-openedo No Have you had any lab work done in the past montho No Have you tested positive for an antibiotic resistant organism (MRSA, VRE)o No Have you tested positive for osteomyelitis (bone infection)o No Have you had any tests for circulation on your legso No Constitutional Symptoms (General Health) Complaints and Symptoms: Negative for: Fever; Chills Eyes Medical History: Negative for: Cataracts; Glaucoma; Optic Neuritis Ear/Nose/Mouth/Throat Medical History: Negative for: Chronic sinus  problems/congestion; Middle ear problems Past Medical History Notes: dysphagia Hematologic/Lymphatic Medical History: Positive for: Anemia Negative for: Hemophilia; Human Immunodeficiency Virus; Lymphedema; Sickle Cell Disease Respiratory Complaints and Symptoms: No Complaints or Symptoms Medical History: Negative for: Aspiration; Asthma; Chronic Obstructive Pulmonary Disease (COPD); Pneumothorax; Sleep Apnea; Tuberculosis Cardiovascular EDEE, NIFONG. ([id number]) Complaints and Symptoms: No Complaints or Symptoms Medical History: Positive for: Hypertension Negative for: Angina; Arrhythmia; Congestive Heart Failure; Coronary Artery Disease; Deep Vein Thrombosis; Hypotension; Myocardial Infarction; Peripheral Arterial Disease; Peripheral Venous Disease; Phlebitis; Vasculitis Gastrointestinal Medical History: Negative for: Cirrhosis ; Colitis; Crohnos; Hepatitis A; Hepatitis B; Hepatitis C Endocrine Medical History: Negative for: Type I Diabetes; Type II Diabetes Genitourinary Medical History: Negative for: End Stage Renal Disease Immunological Medical History: Negative for: Lupus Erythematosus; Raynaudos; Scleroderma Integumentary (Skin) Medical History: Negative for: History of Burn; History of pressure wounds Musculoskeletal Medical History: Negative for: Gout; Rheumatoid Arthritis; Osteoarthritis; Osteomyelitis Neurologic Medical History: Positive for: Dementia Negative for: Neuropathy; Quadriplegia; Paraplegia; Seizure Disorder Past Medical History Notes: parkinsons, tremor Oncologic Medical History: Negative for: Received Chemotherapy; Received Radiation Psychiatric Complaints and Symptoms: No Complaints or Symptoms Medical History:  Negative for: Anorexia/bulimia; Confinement Anxiety LASHEA, GODA ([id number]) Immunizations Pneumococcal Vaccine: Received Pneumococcal Vaccination: Yes Immunization Notes: up to date Implantable Devices Family and  Social History Never smoker; Marital Status - Married; Alcohol Use: Never; Drug Use: No History; Caffeine Use: Never; Financial Concerns: No; Food, Clothing or Shelter Needs: No; Support System Lacking: No; Transportation Concerns: No; Advanced Directives: Yes (Not Provided); Patient does not want information on Advanced Directives; Medical Power of Attorney: Yes - dtr (Not Provided) Physician Affirmation I have reviewed and agree with the above information. Electronic Signature(s) Signed: 06/01/2018 5:51:08 PM By: [name] Signed: 06/02/2018 11:11:19 AM By: Tina Kelp PA-C Entered By: [name] on 06/01/2018 10:47:23 [name] ([id number]) -------------------------------------------------------------------------------- SuperBill Details Patient Name: [name] Date of Service: 06/01/2018 Medical Record Number: [id number] Patient Account Number: [id number] Date of Birth/Sex: 08/30/1923 (84 y.o. Female) Treating RN: [name] Primary Care Provider: Vonita [name] Other Clinician: Referring Provider: Vonita [name] Treating Provider/Extender: [name], [name] [name] in Treatment: 72 Diagnosis Coding ICD-10 Codes Code Description L89.153 Pressure ulcer of sacral region, stage 3 L89.023 Pressure ulcer of left elbow, stage 3 G20 Parkinson's disease L03.312 Cellulitis of back [any part except buttock] S81.811D Laceration without foreign body, right lower leg, subsequent encounter Facility Procedures CPT4 Code: 62952841 Description: 99214 - WOUND CARE VISIT-LEV 4 EST PT Modifier: Quantity: 1 Physician Procedures CPT4 Code: 3244010 Description: 99213 - WC PHYS LEVEL 3 - EST PT ICD-10 Diagnosis Description L89.153 Pressure ulcer of sacral region, stage 3 L89.023 Pressure ulcer of left elbow, stage 3 G20 Parkinson's disease L03.312 Cellulitis of back [any part except buttock] Modifier: Quantity: 1 Electronic Signature(s) Signed: 06/02/2018 11:11:19 AM By:  Tina Kelp PA-C Entered By: [name] on 06/01/2018 10:57:28

## 2018-06-03 NOTE — Telephone Encounter (Signed)
Spoke with daughter. Forms completed.

## 2018-06-15 ENCOUNTER — Encounter: Payer: Medicare Other | Admitting: Physician Assistant

## 2018-06-15 DIAGNOSIS — L89153 Pressure ulcer of sacral region, stage 3: Secondary | ICD-10-CM | POA: Diagnosis not present

## 2018-06-17 NOTE — Progress Notes (Signed)
SHACOYA, BURKHAMMER ([id number]) Visit Report for 06/15/2018 Arrival Information Details Patient Name: [name], [name] Date of Service: 06/15/2018 10:00 AM Medical Record Number: [id number] Patient Account Number: [id number] Date of Birth/Sex: 05/18/1923 (84 y.o. F) Treating RN: [name] Primary Care Maeghan Canny: Vonita Moss Other Clinician: Referring Lealer Marsland: Vonita Moss Treating Tyeshia Cornforth/Extender: Linwood Dibbles, [name] Weeks in Treatment: 43 Visit Information History Since Last Visit Added or deleted any medications: No Patient Arrived: Wheel Chair Any new allergies or adverse reactions: No Arrival Time: 10:05 Had a fall or experienced change in No Accompanied By: caregiver and activities of daily living that may affect daughter risk of falls: Transfer Assistance: Manual Signs or symptoms of abuse/neglect since No Patient Identification Verified: Yes last visito Secondary Verification Process Yes Hospitalized since last visit: No Completed: Implantable device outside of the clinic No Patient Requires Transmission-Based No excluding Precautions: cellular tissue based products placed in the Patient Has Alerts: No center since last visit: Pain Present Now: Unable to Respond Electronic Signature(s) Signed: 06/15/2018 11:36:56 AM By: Elliot Gurney, BSN, RN, CWS, Kim RN, BSN Entered By: Elliot Gurney, BSN, RN, CWS, Kim on 06/15/2018 10:05:28 [name] ([id number]) -------------------------------------------------------------------------------- Clinic Level of Care Assessment Details Patient Name: [name] Date of Service: 06/15/2018 10:00 AM Medical Record Number: [id number] Patient Account Number: [id number] Date of Birth/Sex: Feb 03, 1923 (84 y.o. F) Treating RN: [name] Primary Care Olson Lucarelli: Vonita Moss Other Clinician: Referring Sammuel Blick: Vonita Moss Treating Dinora Hemm/Extender: Linwood Dibbles, [name] Weeks in Treatment: 67 Clinic Level of Care Assessment Items TOOL  4 Quantity Score []  - Use when only an EandM is performed on FOLLOW-UP visit 0 ASSESSMENTS - Nursing Assessment / Reassessment X - Reassessment of Co-morbidities (includes updates in patient status) 1 10 X- 1 5 Reassessment of Adherence to Treatment Plan ASSESSMENTS - Wound and Skin Assessment / Reassessment []  - Simple Wound Assessment / Reassessment - one wound 0 X- 2 5 Complex Wound Assessment / Reassessment - multiple wounds []  - 0 Dermatologic / Skin Assessment (not related to wound area) ASSESSMENTS - Focused Assessment []  - Circumferential Edema Measurements - multi extremities 0 []  - 0 Nutritional Assessment / Counseling / Intervention []  - 0 Lower Extremity Assessment (monofilament, tuning fork, pulses) []  - 0 Peripheral Arterial Disease Assessment (using hand held doppler) ASSESSMENTS - Ostomy and/or Continence Assessment and Care []  - Incontinence Assessment and Management 0 []  - 0 Ostomy Care Assessment and Management (repouching, etc.) PROCESS - Coordination of Care X - Simple Patient / Family Education for ongoing care 1 15 []  - 0 Complex (extensive) Patient / Family Education for ongoing care []  - 0 Staff obtains Chiropractor, Records, Test Results / Process Orders []  - 0 Staff telephones HHA, Nursing Homes / Clarify orders / etc []  - 0 Routine Transfer to another Facility (non-emergent condition) []  - 0 Routine Hospital Admission (non-emergent condition) []  - 0 New Admissions / Manufacturing engineer / Ordering NPWT, Apligraf, etc. []  - 0 Emergency Hospital Admission (emergent condition) X- 1 10 Simple Discharge Coordination [name], [name] ([id number]) []  - 0 Complex (extensive) Discharge Coordination PROCESS - Special Needs []  - Pediatric / Minor Patient Management 0 []  - 0 Isolation Patient Management []  - 0 Hearing / Language / Visual special needs []  - 0 Assessment of Community assistance (transportation, D/C planning, etc.) []  - 0 Additional  assistance / Altered mentation []  - 0 Support Surface(s) Assessment (bed, cushion, seat, etc.) INTERVENTIONS - Wound Cleansing / Measurement []  - Simple Wound Cleansing - one wound 0 X-  2 5 Complex Wound Cleansing - multiple wounds X- 1 5 Wound Imaging (photographs - any number of wounds) []  - 0 Wound Tracing (instead of photographs) []  - 0 Simple Wound Measurement - one wound X- 2 5 Complex Wound Measurement - multiple wounds INTERVENTIONS - Wound Dressings X - Small Wound Dressing one or multiple wounds 2 10 []  - 0 Medium Wound Dressing one or multiple wounds []  - 0 Large Wound Dressing one or multiple wounds []  - 0 Application of Medications - topical []  - 0 Application of Medications - injection INTERVENTIONS - Miscellaneous []  - External ear exam 0 []  - 0 Specimen Collection (cultures, biopsies, blood, body fluids, etc.) []  - 0 Specimen(s) / Culture(s) sent or taken to Lab for analysis []  - 0 Patient Transfer (multiple staff / Nurse, adult / Similar devices) []  - 0 Simple Staple / Suture removal (25 or less) []  - 0 Complex Staple / Suture removal (26 or more) []  - 0 Hypo / Hyperglycemic Management (close monitor of Blood Glucose) []  - 0 Ankle / Brachial Index (ABI) - do not check if billed separately X- 1 5 Vital Signs [name], [name] Has the patient been seen at the hospital within the last three years: Yes Total Score: 100 Level Of Care: New/Established - Level 3 Electronic Signature(s) Signed: 06/15/2018 5:04:25 PM By: [name] Entered By: [name] on 06/15/2018 10:33:01 [name] ([id number]) -------------------------------------------------------------------------------- Lower Extremity Assessment Details Patient Name: [name] Date of Service: 06/15/2018 10:00 AM Medical Record Number: [id number] Patient Account Number: [id number] Date of Birth/Sex: Apr 11, 1923 (84 y.o. F) Treating RN: [name] Primary Care  Moneisha Vosler: Vonita Moss Other Clinician: Referring Amisha Pospisil: Vonita Moss Treating Francies Inch/Extender: Skeet Simmer in Treatment: 76 Electronic Signature(s) Signed: 06/15/2018 11:36:56 AM By: Elliot Gurney, BSN, RN, CWS, Kim RN, BSN Entered By: Elliot Gurney, BSN, RN, CWS, Kim on 06/15/2018 10:19:51 [name] ([id number]) -------------------------------------------------------------------------------- Multi Wound Chart Details Patient Name: [name] Date of Service: 06/15/2018 10:00 AM Medical Record Number: [id number] Patient Account Number: [id number] Date of Birth/Sex: September 26, 1923 (84 y.o. F) Treating RN: [name] Primary Care Sharay Bellissimo: Vonita Moss Other Clinician: Referring Kaliana Albino: Vonita Moss Treating Xianna Siverling/Extender: Linwood Dibbles, [name] Weeks in Treatment: 74 Vital Signs Height(in): 60 Pulse(bpm): 52 Weight(lbs): 90 Blood Pressure(mmHg): 135/49 Body Mass Index(BMI): 18 Temperature(F): 97.7 Respiratory Rate 14 (breaths/min): Photos: [1:No Photos] [9:No Photos] [N/A:N/A] Wound Location: [1:Coccyx - Midline] [9:Left Elbow] [N/A:N/A] Wounding Event: [1:Pressure Injury] [9:Trauma] [N/A:N/A] Primary Etiology: [1:Pressure Ulcer] [9:Trauma, Other] [N/A:N/A] Comorbid History: [1:Anemia, Hypertension, Dementia] [9:N/A] [N/A:N/A] Date Acquired: [1:12/01/2016] [9:02/16/2018] [N/A:N/A] Weeks of Treatment: [1:74] [9:16] [N/A:N/A] Wound Status: [1:Open] [9:Open] [N/A:N/A] Measurements L x W x D [1:2.2x0.5x0.4] [9:0.9x0.5x0.1] [N/A:N/A] (cm) Area (cm) : [1:0.864] [9:0.353] [N/A:N/A] Volume (cm) : [1:0.346] [9:0.035] [N/A:N/A] % Reduction in Area: [1:79.20%] [9:70.80%] [N/A:N/A] % Reduction in Volume: [1:79.20%] [9:71.10%] [N/A:N/A] Position 1 (o'clock): [1:6] Maximum Distance 1 (cm): [1:1.8] Tunneling: [1:Yes] [9:N/A] [N/A:N/A] Classification: [1:Category/Stage IV] [9:Full Thickness Without Exposed Support Structures] [N/A:N/A] Exudate Amount: [1:Medium]  [9:N/A] [N/A:N/A] Exudate Type: [1:Serosanguineous] [9:N/A] [N/A:N/A] Exudate Color: [1:red, brown] [9:N/A] [N/A:N/A] Wound Margin: [1:Flat and Intact] [9:N/A] [N/A:N/A] Granulation Amount: [1:Large (67-100%)] [9:N/A] [N/A:N/A] Granulation Quality: [1:Red, Hyper-granulation] [9:N/A] [N/A:N/A] Necrotic Amount: [1:None Present (0%)] [9:N/A] [N/A:N/A] Exposed Structures: [1:Fat Layer (Subcutaneous Tissue) Exposed: Yes Muscle: Yes Fascia: No Tendon: No Joint: No Bone: No] [9:N/A] [N/A:N/A] Epithelialization: [1:None] [9:N/A] [N/A:N/A] Periwound Skin Texture: [1:Induration: Yes Rash: Yes] [9:No Abnormalities Noted] [N/A:N/A] Excoriation: No Callus: No Crepitus: No Scarring: No Periwound  Skin Moisture: Maceration: Yes No Abnormalities Noted N/A Dry/Scaly: No Periwound Skin Color: Atrophie Blanche: No No Abnormalities Noted N/A Cyanosis: No Ecchymosis: No Erythema: No Hemosiderin Staining: No Mottled: No Pallor: No Rubor: No Temperature: No Abnormality N/A N/A Tenderness on Palpation: Yes No N/A Wound Preparation: Ulcer Cleansing: N/A N/A Rinsed/Irrigated with Saline Topical Anesthetic Applied: Other: lidocaine 4% Treatment Notes Electronic Signature(s) Signed: 06/15/2018 5:04:25 PM By: [name] Entered By: [name] on 06/15/2018 10:31:25 [name] ([id number]) -------------------------------------------------------------------------------- Multi-Disciplinary Care Plan Details Patient Name: [name] Date of Service: 06/15/2018 10:00 AM Medical Record Number: [id number] Patient Account Number: [id number] Date of Birth/Sex: 1923-02-12 (84 y.o. F) Treating RN: [name] Primary Care Finnbar Cedillos: Vonita Moss Other Clinician: Referring Nahomi Hegner: Vonita Moss Treating Asani Mcburney/Extender: Linwood Dibbles, [name] Weeks in Treatment: 52 Active Inactive ` Abuse / Safety / Falls / Self Care Management Nursing Diagnoses: Impaired physical mobility Potential  for falls Goals: Patient will remain injury free Date Initiated: 01/07/2017 Target Resolution Date: 04/03/2017 Goal Status: Active Interventions: Assess fall risk on admission and as needed Notes: ` Nutrition Nursing Diagnoses: Potential for alteratiion in Nutrition/Potential for imbalanced nutrition Goals: Patient/caregiver agrees to and verbalizes understanding of need to use nutritional supplements and/or vitamins as prescribed Date Initiated: 01/07/2017 Target Resolution Date: 04/03/2017 Goal Status: Active Interventions: Assess patient nutrition upon admission and as needed per policy Notes: ` Orientation to the Wound Care Program Nursing Diagnoses: Knowledge deficit related to the wound healing center program Goals: Patient/caregiver will verbalize understanding of the Wound Healing Center Program Date Initiated: 01/07/2017 Target Resolution Date: 04/03/2017 Goal Status: Active [name], [name] ([id number]) Interventions: Provide education on orientation to the wound center Notes: ` Pressure Nursing Diagnoses: Knowledge deficit related to causes and risk factors for pressure ulcer development Goals: Patient will remain free from development of additional pressure ulcers Date Initiated: 01/07/2017 Target Resolution Date: 04/03/2017 Goal Status: Active Interventions: Assess potential for pressure ulcer upon admission and as needed Notes: ` Wound/Skin Impairment Nursing Diagnoses: Impaired tissue integrity Goals: Patient/caregiver will verbalize understanding of skin care regimen Date Initiated: 01/07/2017 Target Resolution Date: 04/03/2017 Goal Status: Active Ulcer/skin breakdown will have a volume reduction of 30% by week 4 Date Initiated: 01/07/2017 Target Resolution Date: 04/03/2017 Goal Status: Active Ulcer/skin breakdown will have a volume reduction of 50% by week 8 Date Initiated: 01/07/2017 Target Resolution Date: 04/03/2017 Goal Status: Active Ulcer/skin  breakdown will have a volume reduction of 80% by week 12 Date Initiated: 01/07/2017 Target Resolution Date: 04/03/2017 Goal Status: Active Ulcer/skin breakdown will heal within 14 weeks Date Initiated: 01/07/2017 Target Resolution Date: 04/03/2017 Goal Status: Active Interventions: Assess patient/caregiver ability to obtain necessary supplies Assess patient/caregiver ability to perform ulcer/skin care regimen upon admission and as needed Assess ulceration(s) every visit Notes: Electronic Signature(s) [name], [name] ([id number]) Signed: 06/15/2018 5:04:25 PM By: [name] Entered By: [name] on 06/15/2018 10:31:16 [name] ([id number]) -------------------------------------------------------------------------------- Pain Assessment Details Patient Name: [name] Date of Service: 06/15/2018 10:00 AM Medical Record Number: [id number] Patient Account Number: [id number] Date of Birth/Sex: [birth year] (84 y.o. F) Treating RN: [name] Primary Care Satin Boal: Vonita Moss Other Clinician: Referring Colie Josten: Vonita Moss Treating Aubrey Blackard/Extender: Linwood Dibbles, [name] Weeks in Treatment: 72 Active Problems Location of Pain Severity and Description of Pain Patient Has Paino Patient Unable to Respond Site Locations Pain Management and Medication Current Pain Management: Electronic Signature(s) Signed: 06/15/2018 11:36:56 AM By: Elliot Gurney, BSN, RN, CWS, Kim RN, BSN Entered By: Elliot Gurney, BSN, RN, CWS, Kim  on 06/15/2018 10:08:49 [name], [name] ([id number]) -------------------------------------------------------------------------------- Wound Assessment Details Patient Name: [name], [name] Date of Service: 06/15/2018 10:00 AM Medical Record Number: [id number] Patient Account Number: [id number] Date of Birth/Sex: [birth year] (84 y.o. F) Treating RN: [name] Primary Care Corissa Oguinn: Vonita Moss Other Clinician: Referring Caine Barfield: Vonita Moss Treating  [name]/Extender: Linwood Dibbles, [name] Weeks in Treatment: 74 Wound Status Wound Number: 1 Primary Etiology: Pressure Ulcer Wound Location: Coccyx - Midline Wound Status: Open Wounding Event: Pressure Injury Comorbid History: Anemia, Hypertension, Dementia Date Acquired: 12/01/2016 Weeks Of Treatment: 74 Clustered Wound: No Photos Photo Uploaded By: Elliot Gurney, BSN, RN, CWS, Kim on 06/15/2018 18:10:28 Wound Measurements Length: (cm) 2.2 Width: (cm) 0.5 Depth: (cm) 0.4 Area: (cm) 0.864 Volume: (cm) 0.346 % Reduction in Area: 79.2% % Reduction in Volume: 79.2% Epithelialization: None Tunneling: Yes Position (o'clock): 6 Maximum Distance: (cm) 1.8 Wound Description Classification: Category/Stage IV Wound Margin: Flat and Intact Exudate Amount: Medium Exudate Type: Serosanguineous Exudate Color: red, brown Foul Odor After Cleansing: No Slough/Fibrino Yes Wound Bed Granulation Amount: Large (67-100%) Exposed Structure Granulation Quality: Red, Hyper-granulation Fascia Exposed: No Necrotic Amount: None Present (0%) Fat Layer (Subcutaneous Tissue) Exposed: Yes Tendon Exposed: No Muscle Exposed: Yes Necrosis of Muscle: No Joint Exposed: No [name], [name]. ([id number]) Bone Exposed: No Periwound Skin Texture Texture Color No Abnormalities Noted: No No Abnormalities Noted: No Callus: No Atrophie Blanche: No Crepitus: No Cyanosis: No Excoriation: No Ecchymosis: No Induration: Yes Erythema: No Rash: Yes Hemosiderin Staining: No Scarring: No Mottled: No Pallor: No Moisture Rubor: No No Abnormalities Noted: No Dry / Scaly: No Temperature / Pain Maceration: Yes Temperature: No Abnormality Tenderness on Palpation: Yes Wound Preparation Ulcer Cleansing: Rinsed/Irrigated with Saline Topical Anesthetic Applied: Other: lidocaine 4%, Electronic Signature(s) Signed: 06/15/2018 11:36:56 AM By: Elliot Gurney, BSN, RN, CWS, Kim RN, BSN Entered By: Elliot Gurney, BSN, RN, CWS, Kim on  06/15/2018 10:18:55 [name] ([id number]) -------------------------------------------------------------------------------- Wound Assessment Details Patient Name: [name] Date of Service: 06/15/2018 10:00 AM Medical Record Number: [id number] Patient Account Number: [id number] Date of Birth/Sex: 1923/02/01 (84 y.o. F) Treating RN: [name] Primary Care Anihya Tuma: Vonita Moss Other Clinician: Referring Nykia Turko: Vonita Moss Treating Laronda Lisby/Extender: Linwood Dibbles, [name] Weeks in Treatment: 74 Wound Status Wound Number: 9 Primary Etiology: Trauma, Other Wound Location: Left Elbow Wound Status: Open Wounding Event: Trauma Date Acquired: 02/16/2018 Weeks Of Treatment: 16 Clustered Wound: No Photos Photo Uploaded By: Elliot Gurney, BSN, RN, CWS, Kim on 06/15/2018 18:10:28 Wound Measurements Length: (cm) 0.9 Width: (cm) 0.5 Depth: (cm) 0.1 Area: (cm) 0.353 Volume: (cm) 0.035 % Reduction in Area: 70.8% % Reduction in Volume: 71.1% Wound Description Full Thickness Without Exposed Support Classification: Structures Periwound Skin Texture Texture Color No Abnormalities Noted: No No Abnormalities Noted: No Moisture No Abnormalities Noted: No Electronic Signature(s) Signed: 06/15/2018 11:36:56 AM By: Elliot Gurney, BSN, RN, CWS, Kim RN, BSN Entered By: Elliot Gurney, BSN, RN, CWS, Kim on 06/15/2018 10:14:04 [name] ([id number]) -------------------------------------------------------------------------------- Vitals Details Patient Name: [name] Date of Service: 06/15/2018 10:00 AM Medical Record Number: [id number] Patient Account Number: [id number] Date of Birth/Sex: Apr 02, 1923 (84 y.o. F) Treating RN: [name] Primary Care Brandonlee Navis: Vonita Moss Other Clinician: Referring Erubiel Manasco: Vonita Moss Treating Paysley Poplar/Extender: Linwood Dibbles, [name] Weeks in Treatment: 68 Vital Signs Time Taken: 10:08 Temperature (F): 97.7 Height (in): 60 Pulse (bpm):  52 Weight (lbs): 90 Respiratory Rate (breaths/min): 14 Body Mass Index (BMI): 17.6 Blood Pressure (mmHg): 135/49 Reference Range: 80 - 120 mg / dl Electronic Signature(s) Signed: 06/15/2018  11:36:56 AM By: Elliot GurneyWoody, BSN, RN, CWS, Kim RN, BSN Entered By: Elliot GurneyWoody, BSN, RN, CWS, Kim on 06/15/2018 10:09:17

## 2018-06-17 NOTE — Progress Notes (Signed)
[name] ([id number]) Visit Report for 06/15/2018 Chief Complaint Document Details Patient Name: [name], [name] Date of Service: 06/15/2018 10:00 AM Medical Record Number: [id number] Patient Account Number: [id number] Date of Birth/Sex: [birth year] (84 y.o. F) Treating RN: Curtis Sites Primary Care Provider: Vonita Moss Other Clinician: Referring Provider: Vonita Moss Treating Provider/Extender: Linwood Dibbles, HOYT Weeks in Treatment: 26 Information Obtained from: Patient Chief Complaint multiple wounds/ulcers Electronic Signature(s) Signed: 06/15/2018 4:58:18 PM By: Lenda Kelp PA-C Entered By: Lenda Kelp on 06/15/2018 09:44:53 [name], [name] ([id number]) -------------------------------------------------------------------------------- HPI Details Patient Name: [name] Date of Service: 06/15/2018 10:00 AM Medical Record Number: [id number] Patient Account Number: [id number] Date of Birth/Sex: April 25, 1923 (84 y.o. F) Treating RN: Curtis Sites Primary Care Provider: Vonita Moss Other Clinician: Referring Provider: Vonita Moss Treating Provider/Extender: Linwood Dibbles, HOYT Weeks in Treatment: 29 History of Present Illness HPI Description: 01/07/17 this is a 84 year old woman admitted to the clinic today for review of a pressure ulcer on her lower sacrum. She is referred from her primary physician's office after being seen on 3/22 with a 3 cm pressure area. Her daughter and caretaker accompanied her today state that the area first became obvious about a month ago and his since deteriorated. They have recently got [name] a home health involved and have been using [name] to the wound. They have ordered a pressure relief surface for her mattress. They are turning her religiously. They state that she eats well and they've been forcing fluids on her. She is on a multivitamin. Looking through [facility] point last albumin I see was 4.4 on 10/28. The patient  has advanced parkinsonism which looks superficially like advanced Parkinson's disease although her daughter tells me she did not ever respond to Sinemet therefore this may have another pathology with signs of parkinsonism. However I think this is largely a mute point currently. She also has dementia and is nonambulatory. Since this started they have been keeping her in bed and turning her religiously every 2 hours. She lives at home in [place] with her husband with 24/7 care giving 01/13/17 santyl change qd. Still will require further debridement. continue santyl. 01/20/17; patient's wound actually looks some better less adherent necrotic surface. There is actually visible granulation. We're using [name] 01/27/17; better-looking surface but still a lot of necrotic tissue on the base of this wound. The periwound erythema is better than last week we are still using [name]. Her daughter tells [place] that she is still having trouble with the pressure-relief mattress through medical modalities 02/03/18; I had the patient scheduled for a two week followup however her daughter brought her in early concerned for discoloration on 2 areas of the wound circumference. We have bee using santyl 02/10/17;Better looking surface to the wound. Rim appears better suggesting better offloading. Using santyl 02/24/17; change to Silver Collegen last time. Wound appears better. 03/10/17; still using silver collagen religious offloading. Intake is satisfactory per her daughter. Dimension slightly better 03/12/2017 -- Dr. Jannetta Quint patient who had been seen 2 days ago and was doing fairly well. The patient is brought in by her daughter who noticed a new wound just above the previous wound on her sacral area and going on more to the left lateral side. She was very concerned and we asked her to get in for an opinion. 03/17/17; above is noted. The patient has developed a progressive area to the left of her original wound. This seems to  this started with a ring of red skin  with a more pale interior almost looking fungal. There was a rim of blister through part of the area although this did not look like zoster. They have been applying triamcinolone that was prescribed last week by Dr. Meyer Russel and the area has a fold to a linear band area which is confluent, erythematous and with obvious epidermal swelling but there is no overt tenderness or crepitus. She has lost some surface epithelium closer to the wound surface itself and now has a more superficial wound in this area and the extending erythema goes towards the left buttock. This is well demarcated between involved in normal skin but once again does not appear to be at all tender. If there is a contact issue here I cannot get the history out of the daughter or the caregiver that are with her. 03/24/17; the patient arrives today with the wound slightly worse slightly more drainage. The bandlike area of erythema that I treated as a possible pineal infection has improved somewhat although proximally is still has confluent erythema without overt tenderness. We have been using silver alginate since the most recent deterioration. To the bandlike degree of erythema we have been using Lotrisone cream 03/31/17; patient arrives today with the wound slightly larger, necrotic surface and surrounding erythema. The bandlike area of erythema that I treated as a possible pineal infection is less swollen but still present I've been using Lotrisone cream on that largely related to the presence of a tinea looking infection when this was first seen. We've been using silver alginate. X-ray that I ordered last week showed no acute bony abnormalities mild fecal impaction. Lab work showed a comprehensive metabolic panel that was normal including an albumin of 3.8. White count was 9.5 hemoglobin 12.3 differential count normal. C-reactive protein was less than 1 and sedimentation rate and 17. The latter 2  values does not support an ongoing bacterial infection. 04/14/17; patient arrives after a 2 week hiatus. Her wound is not in good condition. Although the base of the wound looks KAHLEY, LEIB. ([id number]) stable she still has an erythematous area that was apparently blistered over the weekend. This again points to the left. As our intake nurse pointed out today this is in the area where the tissue folds together and we may need to prevent try to prevent this. Lab work and x-ray that I did to 3 weeks ago were unremarkable including her albumin nevertheless she is an extremely frail condition physically. We have have been using [name] 04/22/17; I changed her to silver alginate because of the surrounding maceration and moisture last week. The daughter did not like the way the wound looked in the middle of the week and changed her back to [place]. They're putting gauze on top of this. Thinks still using Lotrisone. 05/06/17 on evaluation today patient sacral wound appears to be doing okay and does not seem to be any worse. She is having no significant pain during evaluation today the secondary to mental status she is unable to rate or describe whether she had any pain she was not however flinching. Her daughter states that the wound does appear to be looking better to her. Still we are having difficulty with the skinfold that seems to be closing in on itself at this point. All in all I feel like she is making some good progress in the [name] seems to be the official for her. They do tell me that a refill if we are gonna continue that today. No fevers, chills, nausea,  or vomiting noted at this time. 05/13/17 presents today for evaluation concerning her ongoing sacral pressure ulcer. Unfortunately she also has an area of deep tissue injury in the right Ischial region which is starting to show up. The sacral wound also continues to show signs of necrotic tissue overlying and has declined. Overall we really  have not seen a significant improvement in the past several months in regard to the sacral wound and now patient is starting to develop a new wound in the right Ischial region. Obviously this is not trending in the direction that we want to see. No fevers, chills, nausea, or vomiting noted at this time. 05/19/17; I have not seen this wound and almost a month however there is nothing really positive to say about it. Necrotic tissue over the surface which superiorly I think abuts on her sacrum. She has surrounding erythema. I would be surprised if there is not underlying osteomyelitis or soft tissue infection. 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 She apparently eats well per description although I wonder about this looking at her. Lab work I did probably 4 weeks ago however was really quite normal including a serum albumin 05/26/17; culture I did last week grew Escherichia coli and methicillin sensitive staph aureus which should've been well covered by the Augmentin and ciprofloxacin. Indeed the erythema around the wound in the bed of the wound looks somewhat better. Her intake is still satisfactory. They now have a near fluidized bed 06/02/17; they completed the antibiotics last Friday. Using collagen. Daughter still reports eating and drinking well. There is less visualized erythema around the wound. 06/16/17; large pressure ulcer over her lower sacrum and coccyx. Using [name] to the wound bed. 06/30/17; certainly no change in dimensions of this large stage III wound over her sacrum and coccyx. They've been using [name]. There is no exposed bone. She has a candidal/tinea area in the right inguinal area. Other than that her daughter relates that she is eating and drinking well there changing her positioning to make sure the areas offloaded 07/14/17; no major change in the dimensions of this large stage III wound. Initially a smaller wound that became secondarily infected causing  significant tissue breakdown although it is been stable in the last several weeks. Tunneling superiorly at roughly 1:00 no change here either. There is no bone palpable. Both the patient's daughter and caretaker states that she eats well. I have not rechecked her blood work 07/27/17; patient arrives in clinic today and generally a deteriorated looking state. Mild fever with axillary temperature of 100.4. Daughter reports she has not been eating and drinking well since yesterday. She looks more pale and thin and less responsive. We have been using silver collagen to her wound 08/11/17; since the last time the patient was here things have gone better. Her fever went down and she started eating and drinking again. Culture I did of the wound showed methicillin sensitive staph aureus, Morganella and enterococcus. I only treated her with Keflex which would've not covered the Morganella and enterococcus however the purulent area on the 2:00 side of her wound is a lot better and the bandlike erythema that concern me also was resolved. I'd called the daughter last week to confirm that she was a lot better. She finished the Keflex last Thursday she also suffered a skin tear this morning perhaps while putting on her incontinence brief period is on the lateral aspect of her right leg. Clean wound with the surface epithelium not  viable. 08/25/17; last week the patient was noted to have erythema around the wound margin and a slight fever which the patient's daughter says was [id number]. Our office was contacted by home health however we did not have a space to work the patient in that she went to see her primary physician Dr. Maurice MarchLane. She was not febrile during this visit on 08/21/17 there was erythema around the wound similar to last occasion. Dr. Maurice MarchLane in reference to my culture from 07/28/17 and put her on doxycycline capsules which they're opening twice a day for 10 days. 09/17/17; patient arrives today with the wound  bed looking fairly well granulated. There is undermining from 4 to 6:00 although this seems to of contracted slightly. She does not have obvious infection although the daughter states there was some darkening of the wound circumference that is not evident today. They state she is eating well. They are concerned about oral thrush 09/30/16; very fibrin looking granulated wound bed. Her undermining from 4 to 6:00 is about the same but also appears to be well granulated. She has rolled edges of senescent tissue from roughly 7 to 12:00. There is no evidence of infection Tina MainlandSHAW, [name] ([id number]) 10/13/17 on evaluation today patient appears to be doing fairly well all things considered in regard to her sacral wound. There's really not a lot of significant change or improvement she does have some evidence of contusion and deep tissue injury around the left border of the wound that patient's daughter did inquire about today. Nonetheless overall the wound appears to be doing about the same in my opinion. There is no significant indication of infection there also is no significant slough noted at this point. 10/27/17 She is here in follow up evaluation of a sacral ulcer. She is accompanied by her daughter and caregiver. There is red granulation tissue throughout, persistent discoloration to left border; this appears consistent with deep tissue injury. There are multiple areas covered in foam borders, tegaderm, etc that are "preventative" with "no wounds". We will continue with prisma and continue with two week follow ups 11/17/17 on evaluation today patient appears to be doing decently well in regard to her wound at this point. She continues to have a sacral wound ulcer. We see her roughly every two weeks. In the last week she was actually in the hospital due to what was diagnosed as sepsis although no organisms were ever identified in the actual blood cultures. The hospital really was not sure that the  wound was the cause of the infection but they really did not figure out anything else that would be a causative organism she did have a CT scan and that revealed no evidence of pneumonia there was also no evidence of urinary tract infection. Again it very well could have been the wound called in those although wound appears to be doing fairly well at this point. 12/15/17 on evaluation today patient actually appears to be doing about the same in regard to her sacral ulcer. The one thing different is that she does seem to have a rash where her right arm is contracted and being held to the thorax. The area underlying both on the ventral side of the arm as well is the thorax where it comes in contact shows evidence of a rash which appears to be fungal in nature. There does not appear to be any evidence of infection lies at this point. There does not appear to be a rash consistent with shingles which was  also of concern initially. No fevers, chills, nausea, or vomiting noted at this time. 12/29/17 on evaluation today patient appears to be doing a little better in regard to her sacral wound although the one changes the 12 o'clock location of the wound seems to have attached at one point which no longer allows this to pull back. This has caused an area of undermining that seems to be attaching as well in the 12 o'clock location. I do think that this is something that can be managed and is not necessarily a bad thing. Nonetheless she does seem to have some pain with exploration of this region of undermining. She continues to have an area under her right arm on the chest wall of erythema although this is a little better especially after the home health nurse is actually got in order for Diflucan times one for the patient. She is a new area on the left wrist that looks like because of the contraction her nail may have pushed in on her wrist area causing a slight cut which subsequently became infected. She has  some honey crusted drainage noted. 03/09/18 undervaluation today patient appears to be doing fairly well in regard to her wounds in general. She has been tolerating the dressing changes without complication. Overall I'm pleased with the progress that seems to be made week by week especially in regard to the sacral ulcer. Her daughter and the caregiver seem to take very good care of her. With that being said she has very little undermining in regard to the sacral wound in good granulation she also has good epithelialization noted. 03/23/18 on evaluation today patient appears to be doing rather well in regard to her sacral wound. Unfortunately she does have a little bit of necrotic tissue noted in the central portion of her wound on the left elbow. This is due to the fact that she is lying on this arm seeing how it is contracted. Currently her daughter has started to avoid lying on the side it all due to the fact that the necrotic tissue was noted. With that being said they have been taking very good care of her in my pinion. Fortunately there does not appear to be any evidence of infection which is good news. Overall I'm pleased with the progress she's made other than in regard to the elbow. 04/06/18 on evaluation today patient actually appears to be doing okay in regard to the sacral wound. In fact it appears to be somewhat smaller in general although there does appear to be more undermining at the 6 o'clock location than was previously noted. She has been tolerating the dressing changes without complication in general which is also good news. Nonetheless she does have a new small skin tear/opening on her leg which was evaluated today. Fortunately this appears to be minimal and the Xeroform gauze which has been used up to this point in the past has been utilized very effectively seems to be doing well in that regard. Her left elbow ulcer seems to also be doing excellent at this point making good  progress. 04/20/18 on evaluation today patient appears to be doing fairly well in regard to her sacral wound. This definitely seems to be better than during last evaluation where it was indeed infected. With that being said she has been tolerating the dressing changes as best it can be expected. Her elbow seems to be drying out and a lot of times the dressing is getting stuck according to her caregiver. [name],  [name] ([id number]) 05/04/18-She is seen in follow-up evaluation for a sacral and left elbow pressure ulcer. These are stable/improved and we will continue with same treatment plan and she will follow-up in 2 weeks 05/18/18 on evaluation today patient actually appears to be doing better in regard to her left elbow ulcer. The sacral wound is also shown signs of improvement which is good news. With that being said she does have a new right medial lower extremity ulcer which actually appears to show some signs of cellulitis/infection. She is previously taken Augmentin with good result. Nonetheless the patient really does not have anything that I can culture at the site there's actually some good epithelialization noted although again there is some cellulitis appearance as well. 06/01/18 on evaluation today patient appears to actually be doing very well in general in regard to her left elbow wound which is smaller in her lower extremity ulcer which is actually healed. With that being said the sacral wound in particular show signs of improvement although she still has undermining in the six-7 o'clock location. No fevers, chills, nausea, or vomiting noted at this time. 06/15/18 on evaluation today patient actually appears to be doing excellent in regard to her elbow ulcer on the left elbow. This is shown signs of great improvement and I do feel like she is very close to healing in this regard. In general her sacral wound also appears to be doing well there's good sign of improvement there as well as  far as the overall surface of the wound is concerned. She does have a significant area of tunneling at the 6 o'clock location that still is about the same really there's no significant improvement in that regard. Nonetheless we are attempting to try and treat this by way of packing with Prisma followed by silver so to help control moisture. The patient been tolerating the dressing changes without complication. Electronic Signature(s) Signed: 06/15/2018 4:58:18 PM By: Lenda Kelp PA-C Entered By: Lenda Kelp on 06/15/2018 10:34:24 [name] ([id number]) -------------------------------------------------------------------------------- Physical Exam Details Patient Name: [name] Date of Service: 06/15/2018 10:00 AM Medical Record Number: [id number] Patient Account Number: [id number] Date of Birth/Sex: 10/17/1922 (84 y.o. F) Treating RN: Curtis Sites Primary Care Provider: Vonita Moss Other Clinician: Referring Provider: Vonita Moss Treating Provider/Extender: STONE III, HOYT Weeks in Treatment: 51 Constitutional Well-nourished and well-hydrated in no acute distress. Respiratory normal breathing without difficulty. clear to auscultation bilaterally. Cardiovascular regular rate and rhythm with normal S1, S2. Psychiatric Patient is not able to cooperate in decision making regarding care. Patient has dementia. patient is confused. Notes Patient's wound bed at this point did not require any sharp debridement which is good news. She has been tolerating the dressing changes without complication and overall both the although as was the sacrum seems to be doing quite well. I'm very pleased in this regard. Electronic Signature(s) Signed: 06/15/2018 4:58:18 PM By: Lenda Kelp PA-C Entered By: Lenda Kelp on 06/15/2018 10:35:04 [name] ([id number]) -------------------------------------------------------------------------------- Physician Orders  Details Patient Name: [name] Date of Service: 06/15/2018 10:00 AM Medical Record Number: [id number] Patient Account Number: [id number] Date of Birth/Sex: 1923-01-08 (84 y.o. F) Treating RN: Curtis Sites Primary Care Provider: Vonita Moss Other Clinician: Referring Provider: Vonita Moss Treating Provider/Extender: Linwood Dibbles, HOYT Weeks in Treatment: 36 Verbal / Phone Orders: No Diagnosis Coding ICD-10 Coding Code Description L89.153 Pressure ulcer of sacral region, stage 3 L89.023 Pressure ulcer of left elbow, stage 3 G20 Parkinson's  disease L03.312 Cellulitis of back [any part except buttock] S81.811D Laceration without foreign body, right lower leg, subsequent encounter Wound Cleansing Wound #1 Midline Coccyx o Clean wound with Normal Saline. o May Shower, gently pat wound dry prior to applying new dressing. Wound #9 Left Elbow o Clean wound with Normal Saline. o May Shower, gently pat wound dry prior to applying new dressing. Anesthetic (add to Medication List) Wound #1 Midline Coccyx o Topical Lidocaine 4% cream applied to wound bed prior to debridement (In Clinic Only). Wound #9 Left Elbow o Topical Lidocaine 4% cream applied to wound bed prior to debridement (In Clinic Only). Skin Barriers/Peri-Wound Care Wound #1 Midline Coccyx o Skin Prep - use only where the allevyn sticks not on reddened areas o Antifungal powder-Nystatin - around peri-wound on coccyx (do not get in wound), under right breast and right arm, right abdomen Primary Wound Dressing Wound #1 Midline Coccyx o Silver Alginate - silvercel Ag non-adherent o Silver Collagen - prisma ag and pack into undermining as well slightly moisten with hydrogel or KY Jelly and then place silvercel Ag non-adherent on top of the silver collagen Wound #9 Left Elbow o Xeroform Secondary Dressing Wound #1 Midline Coccyx Tina MainlandSHAW, Rosa W. ([id number]) o Boardered Foam Dressing - HHRN  PLEASE ORDER ALLEVYN LIFE BORDERED FOAM FOR PATIENT (OFF BRANDS IRRITATE HER SKIN) Wound #9 Left Elbow o Foam o Non-adherent pad o Other - stretch netting #4 Dressing Change Frequency Wound #1 Midline Coccyx o Change dressing every day. Wound #9 Left Elbow o Change dressing every day. Follow-up Appointments o Return Appointment in 2 weeks. Off-Loading Wound #1 Midline Coccyx o Roho cushion for wheelchair o Mattress - fluidized air mattress o Turn and reposition every 2 hours Additional Orders / Instructions Wound #1 Midline Coccyx o Increase protein intake. - please add protein supplements to patients diet o Other: - please add vitamin A, vitamin C and zinc supplements to patients diet Wound #9 Left Elbow o Increase protein intake. - please add protein supplements to patients diet o Other: - please add vitamin A, vitamin C and zinc supplements to patients diet Home Health Wound #1 Midline Coccyx o Continue Home Health Visits - [facility] o Home Health Nurse may visit PRN to address patientos wound care needs. o FACE TO FACE ENCOUNTER: MEDICARE and MEDICAID PATIENTS: I certify that this patient is under my care and that I had a face-to-face encounter that meets the physician face-to-face encounter requirements with this patient on this date. The encounter with the patient was in whole or in part for the following MEDICAL CONDITION: (primary reason for Home Healthcare) MEDICAL NECESSITY: I certify, that based on my findings, NURSING services are a medically necessary home health service. HOME BOUND STATUS: I certify that my clinical findings support that this patient is homebound (i.e., Due to illness or injury, pt requires aid of supportive devices such as crutches, cane, wheelchairs, walkers, the use of special transportation or the assistance of another person to leave their place of residence. There is a normal inability to leave the home and doing  so requires considerable and taxing effort. Other absences are for medical reasons / religious services and are infrequent or of short duration when for other reasons). o If current dressing causes regression in wound condition, may D/C ordered dressing product/s and apply Normal Saline Moist Dressing daily until next Wound Healing Center / Other MD appointment. Notify Wound Healing Center of regression in wound condition at [phone]. o Please direct any  NON-WOUND related issues/requests for orders to patient's Primary Care Physician - Dr Vonita Moss Wound #9 Left Elbow o Continue Home Health Visits - [facility] o Home Health Nurse may visit PRN to address patientos wound care needs. ASHARA, [place] ([id number]) o FACE TO FACE ENCOUNTER: MEDICARE and MEDICAID PATIENTS: I certify that this patient is under my care and that I had a face-to-face encounter that meets the physician face-to-face encounter requirements with this patient on this date. The encounter with the patient was in whole or in part for the following MEDICAL CONDITION: (primary reason for Home Healthcare) MEDICAL NECESSITY: I certify, that based on my findings, NURSING services are a medically necessary home health service. HOME BOUND STATUS: I certify that my clinical findings support that this patient is homebound (i.e., Due to illness or injury, pt requires aid of supportive devices such as crutches, cane, wheelchairs, walkers, the use of special transportation or the assistance of another person to leave their place of residence. There is a normal inability to leave the home and doing so requires considerable and taxing effort. Other absences are for medical reasons / religious services and are infrequent or of short duration when for other reasons). o If current dressing causes regression in wound condition, may D/C ordered dressing product/s and apply Normal Saline Moist Dressing daily until next Wound  Healing Center / Other MD appointment. Notify Wound Healing Center of regression in wound condition at [phone]. o Please direct any NON-WOUND related issues/requests for orders to patient's Primary Care Physician - Dr Vonita Moss Electronic Signature(s) Signed: 06/15/2018 4:58:18 PM By: Lenda Kelp PA-C Signed: 06/15/2018 5:04:25 PM By: Curtis Sites Entered By: Curtis Sites on 06/15/2018 10:32:31 [name] ([id number]) -------------------------------------------------------------------------------- Problem List Details Patient Name: [name] Date of Service: 06/15/2018 10:00 AM Medical Record Number: [id number] Patient Account Number: [id number] Date of Birth/Sex: 07-Dec-1922 (84 y.o. F) Treating RN: Curtis Sites Primary Care Provider: Vonita Moss Other Clinician: Referring Provider: Vonita Moss Treating Provider/Extender: Linwood Dibbles, HOYT Weeks in Treatment: 27 Active Problems ICD-10 Evaluated Encounter Code Description Active Date Today Diagnosis L89.153 Pressure ulcer of sacral region, stage 3 01/07/2017 No Yes L89.023 Pressure ulcer of left elbow, stage 3 03/23/2018 No Yes G20 Parkinson's disease 01/07/2017 No Yes L03.312 Cellulitis of back [any part except buttock] 07/28/2017 No Yes S81.811D Laceration without foreign body, right lower leg, subsequent 08/11/2017 No Yes encounter Inactive Problems Resolved Problems Electronic Signature(s) Signed: 06/15/2018 4:58:18 PM By: Lenda Kelp PA-C Entered By: Lenda Kelp on 06/15/2018 09:44:46 [name] ([id number]) -------------------------------------------------------------------------------- Progress Note Details Patient Name: [name] Date of Service: 06/15/2018 10:00 AM Medical Record Number: [id number] Patient Account Number: [id number] Date of Birth/Sex: 26-May-1923 (84 y.o. F) Treating RN: Curtis Sites Primary Care Provider: Vonita Moss Other Clinician: Referring  Provider: Vonita Moss Treating Provider/Extender: Linwood Dibbles, HOYT Weeks in Treatment: 107 Subjective Chief Complaint Information obtained from Patient multiple wounds/ulcers History of Present Illness (HPI) 01/07/17 this is a 84 year old woman admitted to the clinic today for review of a pressure ulcer on her lower sacrum. She is referred from her primary physician's office after being seen on 3/22 with a 3 cm pressure area. Her daughter and caretaker accompanied her today state that the area first became obvious about a month ago and his since deteriorated. They have recently got [name] home health involved and have been using [name] to the wound. They have ordered a pressure relief surface for her mattress. They are turning  her religiously. They state that she eats well and they've been forcing fluids on her. She is on a multivitamin. Looking through [facility] point last albumin I see was 4.4 on 10/28. The patient has advanced parkinsonism which looks superficially like advanced Parkinson's disease although her daughter tells me she did not ever respond to Sinemet therefore this may have another pathology with signs of parkinsonism. However I think this is largely a mute point currently. She also has dementia and is nonambulatory. Since this started they have been keeping her in bed and turning her religiously every 2 hours. She lives at home in [place] with her husband with 24/7 care giving 01/13/17 santyl change qd. Still will require further debridement. continue santyl. 01/20/17; patient's wound actually looks some better less adherent necrotic surface. There is actually visible granulation. We're using [name] 01/27/17; better-looking surface but still a lot of necrotic tissue on the base of this wound. The periwound erythema is better than last week we are still using [name]. Her daughter tells [place] that she is still having trouble with the pressure-relief mattress through medical  modalities 02/03/18; I had the patient scheduled for a two week followup however her daughter brought her in early concerned for discoloration on 2 areas of the wound circumference. We have bee using santyl 02/10/17;Better looking surface to the wound. Rim appears better suggesting better offloading. Using santyl 02/24/17; change to Silver Collegen last time. Wound appears better. 03/10/17; still using silver collagen religious offloading. Intake is satisfactory per her daughter. Dimension slightly better 03/12/2017 -- Dr. Jannetta Quint patient who had been seen 2 days ago and was doing fairly well. The patient is brought in by her daughter who noticed a new wound just above the previous wound on her sacral area and going on more to the left lateral side. She was very concerned and we asked her to get in for an opinion. 03/17/17; above is noted. The patient has developed a progressive area to the left of her original wound. This seems to this started with a ring of red skin with a more pale interior almost looking fungal. There was a rim of blister through part of the area although this did not look like zoster. They have been applying triamcinolone that was prescribed last week by Dr. Meyer Russel and the area has a fold to a linear band area which is confluent, erythematous and with obvious epidermal swelling but there is no overt tenderness or crepitus. She has lost some surface epithelium closer to the wound surface itself and now has a more superficial wound in this area and the extending erythema goes towards the left buttock. This is well demarcated between involved in normal skin but once again does not appear to be at all tender. If there is a contact issue here I cannot get the history out of the daughter or the caregiver that are with her. 03/24/17; the patient arrives today with the wound slightly worse slightly more drainage. The bandlike area of erythema that I treated as a possible pineal infection  has improved somewhat although proximally is still has confluent erythema without overt tenderness. We have been using silver alginate since the most recent deterioration. To the bandlike degree of erythema we have been using Lotrisone cream 03/31/17; patient arrives today with the wound slightly larger, necrotic surface and surrounding erythema. The bandlike area of erythema that I treated as a possible pineal infection is less swollen but still present I've been using Lotrisone cream on that largely  related to the presence of a tinea looking infection when this was first seen. We've been using silver alginate. BRALEY, LUCKENBAUGH ([id number]) X-ray that I ordered last week showed no acute bony abnormalities mild fecal impaction. Lab work showed a comprehensive metabolic panel that was normal including an albumin of 3.8. White count was 9.5 hemoglobin 12.3 differential count normal. C-reactive protein was less than 1 and sedimentation rate and 17. The latter 2 values does not support an ongoing bacterial infection. 04/14/17; patient arrives after a 2 week hiatus. Her wound is not in good condition. Although the base of the wound looks stable she still has an erythematous area that was apparently blistered over the weekend. This again points to the left. As our intake nurse pointed out today this is in the area where the tissue folds together and we may need to prevent try to prevent this. Lab work and x-ray that I did to 3 weeks ago were unremarkable including her albumin nevertheless she is an extremely frail condition physically. We have have been using [name] 04/22/17; I changed her to silver alginate because of the surrounding maceration and moisture last week. The daughter did not like the way the wound looked in the middle of the week and changed her back to [facility]. They're putting gauze on top of this. Thinks still using Lotrisone. 05/06/17 on evaluation today patient sacral wound appears to be  doing okay and does not seem to be any worse. She is having no significant pain during evaluation today the secondary to mental status she is unable to rate or describe whether she had any pain she was not however flinching. Her daughter states that the wound does appear to be looking better to her. Still we are having difficulty with the skinfold that seems to be closing in on itself at this point. All in all I feel like she is making some good progress in the [name] seems to be the official for her. They do tell me that a refill if we are gonna continue that today. No fevers, chills, nausea, or vomiting noted at this time. 05/13/17 presents today for evaluation concerning her ongoing sacral pressure ulcer. Unfortunately she also has an area of deep tissue injury in the right Ischial region which is starting to show up. The sacral wound also continues to show signs of necrotic tissue overlying and has declined. Overall we really have not seen a significant improvement in the past several months in regard to the sacral wound and now patient is starting to develop a new wound in the right Ischial region. Obviously this is not trending in the direction that we want to see. No fevers, chills, nausea, or vomiting noted at this time. 05/19/17; I have not seen this wound and almost a month however there is nothing really positive to say about it. Necrotic tissue over the surface which superiorly I think abuts on her sacrum. She has surrounding erythema. I would be surprised if there is not underlying osteomyelitis or soft tissue infection. 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 She apparently eats well per description although I wonder about this looking at her. Lab work I did probably 4 weeks ago however was really quite normal including a serum albumin 05/26/17; culture I did last week grew Escherichia coli and methicillin sensitive staph aureus which should've been well covered by the  Augmentin and ciprofloxacin. Indeed the erythema around the wound in the bed of the wound looks somewhat better. Her  intake is still satisfactory. They now have a near fluidized bed 06/02/17; they completed the antibiotics last Friday. Using collagen. Daughter still reports eating and drinking well. There is less visualized erythema around the wound. 06/16/17; large pressure ulcer over her lower sacrum and coccyx. Using [name] to the wound bed. 06/30/17; certainly no change in dimensions of this large stage III wound over her sacrum and coccyx. They've been using [name]. There is no exposed bone. She has a candidal/tinea area in the right inguinal area. Other than that her daughter relates that she is eating and drinking well there changing her positioning to make sure the areas offloaded 07/14/17; no major change in the dimensions of this large stage III wound. Initially a smaller wound that became secondarily infected causing significant tissue breakdown although it is been stable in the last several weeks. Tunneling superiorly at roughly 1:00 no change here either. There is no bone palpable. Both the patient's daughter and caretaker states that she eats well. I have not rechecked her blood work 07/27/17; patient arrives in clinic today and generally a deteriorated looking state. Mild fever with axillary temperature of 100.4. Daughter reports she has not been eating and drinking well since yesterday. She looks more pale and thin and less responsive. We have been using silver collagen to her wound 08/11/17; since the last time the patient was here things have gone better. Her fever went down and she started eating and drinking again. Culture I did of the wound showed methicillin sensitive staph aureus, [place]ella and enterococcus. I only treated her with Keflex which would've not covered the [place]ella and enterococcus however the purulent area on the 2:00 side of her wound is a lot better and the  bandlike erythema that concern me also was resolved. I'd called the daughter last week to confirm that she was a lot better. She finished the Keflex last Thursday she also suffered a skin tear this morning perhaps while putting on her incontinence brief period is on the lateral aspect of her right leg. Clean wound with the surface epithelium not viable. 08/25/17; last week the patient was noted to have erythema around the wound margin and a slight fever which the patient's daughter says was 86. Our office was contacted by home health however we did not have a space to work the patient in that she went to see her primary physician Dr. Maurice March. She was not febrile during this visit on 08/21/17 there was erythema around the wound similar to last occasion. Dr. Maurice March in reference to my culture from 07/28/17 and put her on doxycycline capsules which they're opening twice a day for 10 days. [name], [name] ([id number]) 09/17/17; patient arrives today with the wound bed looking fairly well granulated. There is undermining from 4 to 6:00 although this seems to of contracted slightly. She does not have obvious infection although the daughter states there was some darkening of the wound circumference that is not evident today. They state she is eating well. They are concerned about oral thrush 09/30/16; very fibrin looking granulated wound bed. Her undermining from 4 to 6:00 is about the same but also appears to be well granulated. She has rolled edges of senescent tissue from roughly 7 to 12:00. There is no evidence of infection 10/13/17 on evaluation today patient appears to be doing fairly well all things considered in regard to her sacral wound. There's really not a lot of significant change or improvement she does have some evidence of contusion and deep  tissue injury around the left border of the wound that patient's daughter did inquire about today. Nonetheless overall the wound appears to be doing about  the same in my opinion. There is no significant indication of infection there also is no significant slough noted at this point. 10/27/17 She is here in follow up evaluation of a sacral ulcer. She is accompanied by her daughter and caregiver. There is red granulation tissue throughout, persistent discoloration to left border; this appears consistent with deep tissue injury. There are multiple areas covered in foam borders, tegaderm, etc that are "preventative" with "no wounds". We will continue with prisma and continue with two week follow ups 11/17/17 on evaluation today patient appears to be doing decently well in regard to her wound at this point. She continues to have a sacral wound ulcer. We see her roughly every two weeks. In the last week she was actually in the hospital due to what was diagnosed as sepsis although no organisms were ever identified in the actual blood cultures. The hospital really was not sure that the wound was the cause of the infection but they really did not figure out anything else that would be a causative organism she did have a CT scan and that revealed no evidence of pneumonia there was also no evidence of urinary tract infection. Again it very well could have been the wound called in those although wound appears to be doing fairly well at this point. 12/15/17 on evaluation today patient actually appears to be doing about the same in regard to her sacral ulcer. The one thing different is that she does seem to have a rash where her right arm is contracted and being held to the thorax. The area underlying both on the ventral side of the arm as well is the thorax where it comes in contact shows evidence of a rash which appears to be fungal in nature. There does not appear to be any evidence of infection lies at this point. There does not appear to be a rash consistent with shingles which was also of concern initially. No fevers, chills, nausea, or vomiting noted at  this time. 12/29/17 on evaluation today patient appears to be doing a little better in regard to her sacral wound although the one changes the 12 o'clock location of the wound seems to have attached at one point which no longer allows this to pull back. This has caused an area of undermining that seems to be attaching as well in the 12 o'clock location. I do think that this is something that can be managed and is not necessarily a bad thing. Nonetheless she does seem to have some pain with exploration of this region of undermining. She continues to have an area under her right arm on the chest wall of erythema although this is a little better especially after the home health nurse is actually got in order for Diflucan times one for the patient. She is a new area on the left wrist that looks like because of the contraction her nail may have pushed in on her wrist area causing a slight cut which subsequently became infected. She has some honey crusted drainage noted. 03/09/18 undervaluation today patient appears to be doing fairly well in regard to her wounds in general. She has been tolerating the dressing changes without complication. Overall I'm pleased with the progress that seems to be made week by week especially in regard to the sacral ulcer. Her daughter and the caregiver seem  to take very good care of her. With that being said she has very little undermining in regard to the sacral wound in good granulation she also has good epithelialization noted. 03/23/18 on evaluation today patient appears to be doing rather well in regard to her sacral wound. Unfortunately she does have a little bit of necrotic tissue noted in the central portion of her wound on the left elbow. This is due to the fact that she is lying on this arm seeing how it is contracted. Currently her daughter has started to avoid lying on the side it all due to the fact that the necrotic tissue was noted. With that being said they  have been taking very good care of her in my pinion. Fortunately there does not appear to be any evidence of infection which is good news. Overall I'm pleased with the progress she's made other than in regard to the elbow. 04/06/18 on evaluation today patient actually appears to be doing okay in regard to the sacral wound. In fact it appears to be somewhat smaller in general although there does appear to be more undermining at the 6 o'clock location than was previously noted. She has been tolerating the dressing changes without complication in general which is also good news. Nonetheless she does have a new small skin tear/opening on her leg which was evaluated today. Fortunately this appears to be minimal and the Xeroform gauze which has been used up to this point in the past has been utilized very effectively seems EKATERINI, CAPITANO. ([id number]) to be doing well in that regard. Her left elbow ulcer seems to also be doing excellent at this point making good progress. 04/20/18 on evaluation today patient appears to be doing fairly well in regard to her sacral wound. This definitely seems to be better than during last evaluation where it was indeed infected. With that being said she has been tolerating the dressing changes as best it can be expected. Her elbow seems to be drying out and a lot of times the dressing is getting stuck according to her caregiver. 05/04/18-She is seen in follow-up evaluation for a sacral and left elbow pressure ulcer. These are stable/improved and we will continue with same treatment plan and she will follow-up in 2 weeks 05/18/18 on evaluation today patient actually appears to be doing better in regard to her left elbow ulcer. The sacral wound is also shown signs of improvement which is good news. With that being said she does have a new right medial lower extremity ulcer which actually appears to show some signs of cellulitis/infection. She is previously taken Augmentin with  good result. Nonetheless the patient really does not have anything that I can culture at the site there's actually some good epithelialization noted although again there is some cellulitis appearance as well. 06/01/18 on evaluation today patient appears to actually be doing very well in general in regard to her left elbow wound which is smaller in her lower extremity ulcer which is actually healed. With that being said the sacral wound in particular show signs of improvement although she still has undermining in the six-7 o'clock location. No fevers, chills, nausea, or vomiting noted at this time. 06/15/18 on evaluation today patient actually appears to be doing excellent in regard to her elbow ulcer on the left elbow. This is shown signs of great improvement and I do feel like she is very close to healing in this regard. In general her sacral wound also appears to be  doing well there's good sign of improvement there as well as far as the overall surface of the wound is concerned. She does have a significant area of tunneling at the 6 o'clock location that still is about the same really there's no significant improvement in that regard. Nonetheless we are attempting to try and treat this by way of packing with Prisma followed by silver so to help control moisture. The patient been tolerating the dressing changes without complication. Patient History Unable to Obtain Patient History due to Altered Mental Status. Information obtained from Patient. Social History Never smoker, Marital Status - Married, Alcohol Use - Never, Drug Use - No History, Caffeine Use - Never. Medical And Surgical History Notes Ear/Nose/Mouth/Throat dysphagia Neurologic parkinsons, tremor Review of Systems (ROS) Constitutional Symptoms (General Health) Denies complaints or symptoms of Fever, Chills. Respiratory The patient has no complaints or symptoms. Cardiovascular The patient has no complaints or  symptoms. Psychiatric The patient has no complaints or symptoms. [name], [name] ([id number]) Objective Constitutional Well-nourished and well-hydrated in no acute distress. Vitals Time Taken: 10:08 AM, Height: 60 in, Weight: 90 lbs, BMI: 17.6, Temperature: 97.7 F, Pulse: 52 bpm, Respiratory Rate: 14 breaths/min, Blood Pressure: 135/49 mmHg. Respiratory normal breathing without difficulty. clear to auscultation bilaterally. Cardiovascular regular rate and rhythm with normal S1, S2. Psychiatric Patient is not able to cooperate in decision making regarding care. Patient has dementia. patient is confused. General Notes: Patient's wound bed at this point did not require any sharp debridement which is good news. She has been tolerating the dressing changes without complication and overall both the although as was the sacrum seems to be doing quite well. I'm very pleased in this regard. Integumentary (Hair, Skin) Wound #1 status is Open. Original cause of wound was Pressure Injury. The wound is located on the Midline Coccyx. The wound measures 2.2cm length x 0.5cm width x 0.4cm depth; 0.864cm^2 area and 0.346cm^3 volume. There is muscle and Fat Layer (Subcutaneous Tissue) Exposed exposed. There is tunneling at 6:00 with a maximum distance of 1.8cm. There is a medium amount of serosanguineous drainage noted. The wound margin is flat and intact. There is large (67-100%) red, hyper - granulation within the wound bed. There is no necrotic tissue within the wound bed. The periwound skin appearance exhibited: Induration, Rash, Maceration. The periwound skin appearance did not exhibit: Callus, Crepitus, Excoriation, Scarring, Dry/Scaly, Atrophie Blanche, Cyanosis, Ecchymosis, Hemosiderin Staining, Mottled, Pallor, Rubor, Erythema. Periwound temperature was noted as No Abnormality. The periwound has tenderness on palpation. Wound #9 status is Open. Original cause of wound was Trauma. The wound is  located on the Left Elbow. The wound measures 0.9cm length x 0.5cm width x 0.1cm depth; 0.353cm^2 area and 0.035cm^3 volume. Assessment Active Problems ICD-10 Pressure ulcer of sacral region, stage 3 Pressure ulcer of left elbow, stage 3 Parkinson's disease Cellulitis of back [any part except buttock] Laceration without foreign body, right lower leg, subsequent encounter Plan [name], [name]. ([id number]) Wound Cleansing: Wound #1 Midline Coccyx: Clean wound with Normal Saline. May Shower, gently pat wound dry prior to applying new dressing. Wound #9 Left Elbow: Clean wound with Normal Saline. May Shower, gently pat wound dry prior to applying new dressing. Anesthetic (add to Medication List): Wound #1 Midline Coccyx: Topical Lidocaine 4% cream applied to wound bed prior to debridement (In Clinic Only). Wound #9 Left Elbow: Topical Lidocaine 4% cream applied to wound bed prior to debridement (In Clinic Only). Skin Barriers/Peri-Wound Care: Wound #1 Midline Coccyx: Skin Prep -  use only where the allevyn sticks not on reddened areas Antifungal powder-Nystatin - around peri-wound on coccyx (do not get in wound), under right breast and right arm, right abdomen Primary Wound Dressing: Wound #1 Midline Coccyx: Silver Alginate - silvercel Ag non-adherent Silver Collagen - prisma ag and pack into undermining as well slightly moisten with hydrogel or KY Jelly and then place silvercel Ag non-adherent on top of the silver collagen Wound #9 Left Elbow: Xeroform Secondary Dressing: Wound #1 Midline Coccyx: Boardered Foam Dressing - HHRN PLEASE ORDER ALLEVYN LIFE BORDERED FOAM FOR PATIENT (OFF BRANDS IRRITATE HER SKIN) Wound #9 Left Elbow: Foam Non-adherent pad Other - stretch netting #4 Dressing Change Frequency: Wound #1 Midline Coccyx: Change dressing every day. Wound #9 Left Elbow: Change dressing every day. Follow-up Appointments: Return Appointment in 2  weeks. Off-Loading: Wound #1 Midline Coccyx: Roho cushion for wheelchair Mattress - fluidized air mattress Turn and reposition every 2 hours Additional Orders / Instructions: Wound #1 Midline Coccyx: Increase protein intake. - please add protein supplements to patients diet Other: - please add vitamin A, vitamin C and zinc supplements to patients diet Wound #9 Left Elbow: Increase protein intake. - please add protein supplements to patients diet Other: - please add vitamin A, vitamin C and zinc supplements to patients diet Home Health: Wound #1 Midline Coccyx: Continue Home Health Visits - [facility] Nurse may visit PRN to address patient s wound care needs. FACE TO FACE ENCOUNTER: MEDICARE and MEDICAID PATIENTS: I certify that this patient is under my care and that I had a face-to-face encounter that meets the physician face-to-face encounter requirements with this patient on this date. The encounter with the patient was in whole or in part for the following MEDICAL CONDITION: (primary reason for Home Healthcare) MEDICAL NECESSITY: I certify, that based on my findings, NURSING services are a medically necessary home [name], [name] ([id number]) health service. HOME BOUND STATUS: I certify that my clinical findings support that this patient is homebound (i.e., Due to illness or injury, pt requires aid of supportive devices such as crutches, cane, wheelchairs, walkers, the use of special transportation or the assistance of another person to leave their place of residence. There is a normal inability to leave the home and doing so requires considerable and taxing effort. Other absences are for medical reasons / religious services and are infrequent or of short duration when for other reasons). If current dressing causes regression in wound condition, may D/C ordered dressing product/s and apply Normal Saline Moist Dressing daily until next Wound Healing Center / Other MD  appointment. Notify Wound Healing Center of regression in wound condition at [phone]. Please direct any NON-WOUND related issues/requests for orders to patient's Primary Care Physician - Dr Vonita Moss Wound #9 Left Elbow: Continue Home Health Visits - [facility] Nurse may visit PRN to address patient s wound care needs. FACE TO FACE ENCOUNTER: MEDICARE and MEDICAID PATIENTS: I certify that this patient is under my care and that I had a face-to-face encounter that meets the physician face-to-face encounter requirements with this patient on this date. The encounter with the patient was in whole or in part for the following MEDICAL CONDITION: (primary reason for Home Healthcare) MEDICAL NECESSITY: I certify, that based on my findings, NURSING services are a medically necessary home health service. HOME BOUND STATUS: I certify that my clinical findings support that this patient is homebound (i.e., Due to illness or injury, pt requires aid of supportive devices such  as crutches, cane, wheelchairs, walkers, the use of special transportation or the assistance of another person to leave their place of residence. There is a normal inability to leave the home and doing so requires considerable and taxing effort. Other absences are for medical reasons / religious services and are infrequent or of short duration when for other reasons). If current dressing causes regression in wound condition, may D/C ordered dressing product/s and apply Normal Saline Moist Dressing daily until next Wound Healing Center / Other MD appointment. Notify Wound Healing Center of regression in wound condition at [phone]. Please direct any NON-WOUND related issues/requests for orders to patient's Primary Care Physician - Dr Vonita Moss At this point I'm gonna suggest that we continue with the above wound care measures for the next week and the patient's daughter is in agreement the plan. Subsequently we're  gonna see were things stand at follow-up. Please see above for specific wound care orders. We will see patient for re-evaluation in 2 week(s) here in the clinic. If anything worsens or changes patient will contact our office for additional recommendations. Electronic Signature(s) Signed: 06/15/2018 4:58:18 PM By: Lenda Kelp PA-C Entered By: Lenda Kelp on 06/15/2018 10:35:23 [name] ([id number]) -------------------------------------------------------------------------------- ROS/PFSH Details Patient Name: [name] Date of Service: 06/15/2018 10:00 AM Medical Record Number: [id number] Patient Account Number: [id number] Date of Birth/Sex: [birth year] (84 y.o. F) Treating RN: Curtis Sites Primary Care Provider: Vonita Moss Other Clinician: Referring Provider: Vonita Moss Treating Provider/Extender: Linwood Dibbles, HOYT Weeks in Treatment: 85 Unable to Obtain Patient History due to oo Altered Mental Status Information Obtained From Patient Wound History Do you currently have one or more open woundso Yes How many open wounds do you currently haveo 1 Approximately how long have you had your woundso 1 week How have you been treating your wound(s) until nowo santyl and gauze Has your wound(s) ever healed and then re-openedo No Have you had any lab work done in the past montho No Have you tested positive for an antibiotic resistant organism (MRSA, VRE)o No Have you tested positive for osteomyelitis (bone infection)o No Have you had any tests for circulation on your legso No Constitutional Symptoms (General Health) Complaints and Symptoms: Negative for: Fever; Chills Eyes Medical History: Negative for: Cataracts; Glaucoma; Optic Neuritis Ear/Nose/Mouth/Throat Medical History: Negative for: Chronic sinus problems/congestion; Middle ear problems Past Medical History Notes: dysphagia Hematologic/Lymphatic Medical History: Positive for: Anemia Negative for:  Hemophilia; Human Immunodeficiency Virus; Lymphedema; Sickle Cell Disease Respiratory Complaints and Symptoms: No Complaints or Symptoms Medical History: Negative for: Aspiration; Asthma; Chronic Obstructive Pulmonary Disease (COPD); Pneumothorax; Sleep Apnea; Tuberculosis Cardiovascular [name], AGRO. ([id number]) Complaints and Symptoms: No Complaints or Symptoms Medical History: Positive for: Hypertension Negative for: Angina; Arrhythmia; Congestive Heart Failure; Coronary Artery Disease; Deep Vein Thrombosis; Hypotension; Myocardial Infarction; Peripheral Arterial Disease; Peripheral Venous Disease; Phlebitis; Vasculitis Gastrointestinal Medical History: Negative for: Cirrhosis ; Colitis; Crohnos; Hepatitis A; Hepatitis B; Hepatitis C Endocrine Medical History: Negative for: Type I Diabetes; Type II Diabetes Genitourinary Medical History: Negative for: End Stage Renal Disease Immunological Medical History: Negative for: Lupus Erythematosus; Raynaudos; Scleroderma Integumentary (Skin) Medical History: Negative for: History of Burn; History of pressure wounds Musculoskeletal Medical History: Negative for: Gout; Rheumatoid Arthritis; Osteoarthritis; Osteomyelitis Neurologic Medical History: Positive for: Dementia Negative for: Neuropathy; Quadriplegia; Paraplegia; Seizure Disorder Past Medical History Notes: parkinsons, tremor Oncologic Medical History: Negative for: Received Chemotherapy; Received Radiation Psychiatric Complaints and Symptoms: No Complaints or Symptoms Medical History: Negative for: Anorexia/bulimia;  Confinement Anxiety [name], [name] ([id number]) Immunizations Pneumococcal Vaccine: Received Pneumococcal Vaccination: Yes Immunization Notes: up to date Implantable Devices Family and Social History Never smoker; Marital Status - Married; Alcohol Use: Never; Drug Use: No History; Caffeine Use: Never; Financial Concerns: No; Food, Clothing or  Shelter Needs: No; Support System Lacking: No; Transportation Concerns: No; Advanced Directives: Yes (Not Provided); Patient does not want information on Advanced Directives; Medical Power of Attorney: Yes - dtr (Not Provided) Physician Affirmation I have reviewed and agree with the above information. Electronic Signature(s) Signed: 06/15/2018 4:58:18 PM By: Lenda Kelp PA-C Signed: 06/15/2018 5:04:25 PM By: Curtis Sites Entered By: Lenda Kelp on 06/15/2018 10:34:41 [name] ([id number]) -------------------------------------------------------------------------------- SuperBill Details Patient Name: [name] Date of Service: 06/15/2018 Medical Record Number: [id number] Patient Account Number: [id number] Date of Birth/Sex: 05/21/1923 (84 y.o. F) Treating RN: Curtis Sites Primary Care Provider: Vonita Moss Other Clinician: Referring Provider: Vonita Moss Treating Provider/Extender: Linwood Dibbles, HOYT Weeks in Treatment: 74 Diagnosis Coding ICD-10 Codes Code Description L89.153 Pressure ulcer of sacral region, stage 3 L89.023 Pressure ulcer of left elbow, stage 3 G20 Parkinson's disease L03.312 Cellulitis of back [any part except buttock] S81.811D Laceration without foreign body, right lower leg, subsequent encounter Facility Procedures CPT4 Code: 08657846 Description: 99213 - WOUND CARE VISIT-LEV 3 EST PT Modifier: Quantity: 1 Physician Procedures CPT4 Code: [id number] Description: 99213 - WC PHYS LEVEL 3 - EST PT ICD-10 Diagnosis Description L89.153 Pressure ulcer of sacral region, stage 3 L89.023 Pressure ulcer of left elbow, stage 3 G20 Parkinson's disease L03.312 Cellulitis of back [any part except buttock] Modifier: Quantity: 1 Electronic Signature(s) Signed: 06/15/2018 4:58:18 PM By: Lenda Kelp PA-C Entered By: Lenda Kelp on 06/15/2018 10:35:35

## 2018-06-29 ENCOUNTER — Telehealth: Payer: Self-pay | Admitting: Family Medicine

## 2018-06-29 ENCOUNTER — Encounter: Payer: Medicare Other | Attending: Physician Assistant | Admitting: Physician Assistant

## 2018-06-29 DIAGNOSIS — I10 Essential (primary) hypertension: Secondary | ICD-10-CM | POA: Diagnosis not present

## 2018-06-29 DIAGNOSIS — G20 Parkinson's disease: Secondary | ICD-10-CM | POA: Insufficient documentation

## 2018-06-29 DIAGNOSIS — L89153 Pressure ulcer of sacral region, stage 3: Secondary | ICD-10-CM | POA: Diagnosis not present

## 2018-06-29 DIAGNOSIS — F0280 Dementia in other diseases classified elsewhere without behavioral disturbance: Secondary | ICD-10-CM | POA: Diagnosis not present

## 2018-06-29 DIAGNOSIS — L89023 Pressure ulcer of left elbow, stage 3: Secondary | ICD-10-CM | POA: Diagnosis not present

## 2018-06-29 DIAGNOSIS — L03312 Cellulitis of back [any part except buttock]: Secondary | ICD-10-CM | POA: Diagnosis not present

## 2018-06-29 NOTE — Telephone Encounter (Signed)
[name] would like to know if ok to give patient the flu shot in her current condition.  Please advise  Thank you

## 2018-06-30 ENCOUNTER — Encounter: Payer: Self-pay | Admitting: Family Medicine

## 2018-06-30 NOTE — Telephone Encounter (Signed)
Discuss flu vaccine with patient's daughter will go ahead and get flu vaccine.

## 2018-06-30 NOTE — Telephone Encounter (Signed)
Call [facility]

## 2018-07-03 NOTE — Progress Notes (Signed)
TIFFONY, KITE ([id number]) Visit Report for 06/29/2018 Chief Complaint Document Details Patient Name: [name], [name] Date of Service: 06/29/2018 10:00 AM Medical Record Number: [id number] Patient Account Number: [id number] Date of Birth/Sex: Nov 21, 1922 (84 y.o. F) Treating RN: Phillis Haggis Primary Care Provider: Vonita Moss Other Clinician: Referring Provider: Vonita Moss Treating Provider/Extender: Linwood Dibbles, HOYT Weeks in Treatment: 38 Information Obtained from: Patient Chief Complaint multiple wounds/ulcers Electronic Signature(s) Signed: 06/30/2018 8:03:16 PM By: Lenda Kelp PA-C Entered By: Lenda Kelp on 06/29/2018 09:49:24 [name] ([id number]) -------------------------------------------------------------------------------- HPI Details Patient Name: [name] Date of Service: 06/29/2018 10:00 AM Medical Record Number: [id number] Patient Account Number: [id number] Date of Birth/Sex: 07/08/1923 (84 y.o. F) Treating RN: Curtis Sites Primary Care Provider: Vonita Moss Other Clinician: Referring Provider: Vonita Moss Treating Provider/Extender: Linwood Dibbles, HOYT Weeks in Treatment: 2 History of Present Illness HPI Description: 01/07/17 this is a 84 year old woman admitted to the clinic today for review of a pressure ulcer on her lower sacrum. She is referred from her primary physician's office after being seen on 3/22 with a 3 cm pressure area. Her daughter and caretaker accompanied her today state that the area first became obvious about a month ago and his since deteriorated. They have recently got [name] home health involved and have been using [name] to the wound. They have ordered a pressure relief surface for her mattress. They are turning her religiously. They state that she eats well and they've been forcing fluids on her. She is on a multivitamin. Looking through [facility] point last albumin I see was 4.4 on 10/28. The patient  has advanced parkinsonism which looks superficially like advanced Parkinson's disease although her daughter tells me she did not ever respond to Sinemet therefore this may have another pathology with signs of parkinsonism. However I think this is largely a mute point currently. She also has dementia and is nonambulatory. Since this started they have been keeping her in bed and turning her religiously every 2 hours. She lives at home in [place] with her husband with 24/7 care giving 01/13/17 santyl change qd. Still will require further debridement. continue santyl. 01/20/17; patient's wound actually looks some better less adherent necrotic surface. There is actually visible granulation. We're using [name] 01/27/17; better-looking surface but still a lot of necrotic tissue on the base of this wound. The periwound erythema is better than last week we are still using [name]. Her daughter tells [place] that she is still having trouble with the pressure-relief mattress through medical modalities 02/03/18; I had the patient scheduled for a two week followup however her daughter brought her in early concerned for discoloration on 2 areas of the wound circumference. We have bee using santyl 02/10/17;Better looking surface to the wound. Rim appears better suggesting better offloading. Using santyl 02/24/17; change to Silver Collegen last time. Wound appears better. 03/10/17; still using silver collagen religious offloading. Intake is satisfactory per her daughter. Dimension slightly better 03/12/2017 -- Dr. Jannetta Quint patient who had been seen 2 days ago and was doing fairly well. The patient is brought in by her daughter who noticed a new wound just above the previous wound on her sacral area and going on more to the left lateral side. She was very concerned and we asked her to get in for an opinion. 03/17/17; above is noted. The patient has developed a progressive area to the left of her original wound. This seems to  this started with a ring of red skin  with a more pale interior almost looking fungal. There was a rim of blister through part of the area although this did not look like zoster. They have been applying triamcinolone that was prescribed last week by Dr. Meyer Russel and the area has a fold to a linear band area which is confluent, erythematous and with obvious epidermal swelling but there is no overt tenderness or crepitus. She has lost some surface epithelium closer to the wound surface itself and now has a more superficial wound in this area and the extending erythema goes towards the left buttock. This is well demarcated between involved in normal skin but once again does not appear to be at all tender. If there is a contact issue here I cannot get the history out of the daughter or the caregiver that are with her. 03/24/17; the patient arrives today with the wound slightly worse slightly more drainage. The bandlike area of erythema that I treated as a possible pineal infection has improved somewhat although proximally is still has confluent erythema without overt tenderness. We have been using silver alginate since the most recent deterioration. To the bandlike degree of erythema we have been using Lotrisone cream 03/31/17; patient arrives today with the wound slightly larger, necrotic surface and surrounding erythema. The bandlike area of erythema that I treated as a possible pineal infection is less swollen but still present I've been using Lotrisone cream on that largely related to the presence of a tinea looking infection when this was first seen. We've been using silver alginate. X-ray that I ordered last week showed no acute bony abnormalities mild fecal impaction. Lab work showed a comprehensive metabolic panel that was normal including an albumin of 3.8. White count was 9.5 hemoglobin 12.3 differential count normal. C-reactive protein was less than 1 and sedimentation rate and 17. The latter 2  values does not support an ongoing bacterial infection. 04/14/17; patient arrives after a 2 week hiatus. Her wound is not in good condition. Although the base of the wound looks GIANNINA, BARTOLOME. ([id number]) stable she still has an erythematous area that was apparently blistered over the weekend. This again points to the left. As our intake nurse pointed out today this is in the area where the tissue folds together and we may need to prevent try to prevent this. Lab work and x-ray that I did to 3 weeks ago were unremarkable including her albumin nevertheless she is an extremely frail condition physically. We have have been using [name] 04/22/17; I changed her to silver alginate because of the surrounding maceration and moisture last week. The daughter did not like the way the wound looked in the middle of the week and changed her back to [place]. They're putting gauze on top of this. Thinks still using Lotrisone. 05/06/17 on evaluation today patient sacral wound appears to be doing okay and does not seem to be any worse. She is having no significant pain during evaluation today the secondary to mental status she is unable to rate or describe whether she had any pain she was not however flinching. Her daughter states that the wound does appear to be looking better to her. Still we are having difficulty with the skinfold that seems to be closing in on itself at this point. All in all I feel like she is making some good progress in the [name] seems to be the official for her. They do tell me that a refill if we are gonna continue that today. No fevers, chills, nausea,  or vomiting noted at this time. 05/13/17 presents today for evaluation concerning her ongoing sacral pressure ulcer. Unfortunately she also has an area of deep tissue injury in the right Ischial region which is starting to show up. The sacral wound also continues to show signs of necrotic tissue overlying and has declined. Overall we really  have not seen a significant improvement in the past several months in regard to the sacral wound and now patient is starting to develop a new wound in the right Ischial region. Obviously this is not trending in the direction that we want to see. No fevers, chills, nausea, or vomiting noted at this time. 05/19/17; I have not seen this wound and almost a month however there is nothing really positive to say about it. Necrotic tissue over the surface which superiorly I think abuts on her sacrum. She has surrounding erythema. I would be surprised if there is not underlying osteomyelitis or soft tissue infection. 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 She apparently eats well per description although I wonder about this looking at her. Lab work I did probably 4 weeks ago however was really quite normal including a serum albumin 05/26/17; culture I did last week grew Escherichia coli and methicillin sensitive staph aureus which should've been well covered by the Augmentin and ciprofloxacin. Indeed the erythema around the wound in the bed of the wound looks somewhat better. Her intake is still satisfactory. They now have a near fluidized bed 06/02/17; they completed the antibiotics last Friday. Using collagen. Daughter still reports eating and drinking well. There is less visualized erythema around the wound. 06/16/17; large pressure ulcer over her lower sacrum and coccyx. Using [name] to the wound bed. 06/30/17; certainly no change in dimensions of this large stage III wound over her sacrum and coccyx. They've been using [name]. There is no exposed bone. She has a candidal/tinea area in the right inguinal area. Other than that her daughter relates that she is eating and drinking well there changing her positioning to make sure the areas offloaded 07/14/17; no major change in the dimensions of this large stage III wound. Initially a smaller wound that became secondarily infected causing  significant tissue breakdown although it is been stable in the last several weeks. Tunneling superiorly at roughly 1:00 no change here either. There is no bone palpable. Both the patient's daughter and caretaker states that she eats well. I have not rechecked her blood work 07/27/17; patient arrives in clinic today and generally a deteriorated looking state. Mild fever with axillary temperature of 100.4. Daughter reports she has not been eating and drinking well since yesterday. She looks more pale and thin and less responsive. We have been using silver collagen to her wound 08/11/17; since the last time the patient was here things have gone better. Her fever went down and she started eating and drinking again. Culture I did of the wound showed methicillin sensitive staph aureus, Morganella and enterococcus. I only treated her with Keflex which would've not covered the Morganella and enterococcus however the purulent area on the 2:00 side of her wound is a lot better and the bandlike erythema that concern me also was resolved. I'd called the daughter last week to confirm that she was a lot better. She finished the Keflex last Thursday she also suffered a skin tear this morning perhaps while putting on her incontinence brief period is on the lateral aspect of her right leg. Clean wound with the surface epithelium not  viable. 08/25/17; last week the patient was noted to have erythema around the wound margin and a slight fever which the patient's daughter says was [id number]. Our office was contacted by home health however we did not have a space to work the patient in that she went to see her primary physician Dr. Maurice MarchLane. She was not febrile during this visit on 08/21/17 there was erythema around the wound similar to last occasion. Dr. Maurice MarchLane in reference to my culture from 07/28/17 and put her on doxycycline capsules which they're opening twice a day for 10 days. 09/17/17; patient arrives today with the wound  bed looking fairly well granulated. There is undermining from 4 to 6:00 although this seems to of contracted slightly. She does not have obvious infection although the daughter states there was some darkening of the wound circumference that is not evident today. They state she is eating well. They are concerned about oral thrush 09/30/16; very fibrin looking granulated wound bed. Her undermining from 4 to 6:00 is about the same but also appears to be well granulated. She has rolled edges of senescent tissue from roughly 7 to 12:00. There is no evidence of infection Tina MainlandSHAW, [name] ([id number]) 10/13/17 on evaluation today patient appears to be doing fairly well all things considered in regard to her sacral wound. There's really not a lot of significant change or improvement she does have some evidence of contusion and deep tissue injury around the left border of the wound that patient's daughter did inquire about today. Nonetheless overall the wound appears to be doing about the same in my opinion. There is no significant indication of infection there also is no significant slough noted at this point. 10/27/17 She is here in follow up evaluation of a sacral ulcer. She is accompanied by her daughter and caregiver. There is red granulation tissue throughout, persistent discoloration to left border; this appears consistent with deep tissue injury. There are multiple areas covered in foam borders, tegaderm, etc that are "preventative" with "no wounds". We will continue with prisma and continue with two week follow ups 11/17/17 on evaluation today patient appears to be doing decently well in regard to her wound at this point. She continues to have a sacral wound ulcer. We see her roughly every two weeks. In the last week she was actually in the hospital due to what was diagnosed as sepsis although no organisms were ever identified in the actual blood cultures. The hospital really was not sure that the  wound was the cause of the infection but they really did not figure out anything else that would be a causative organism she did have a CT scan and that revealed no evidence of pneumonia there was also no evidence of urinary tract infection. Again it very well could have been the wound called in those although wound appears to be doing fairly well at this point. 12/15/17 on evaluation today patient actually appears to be doing about the same in regard to her sacral ulcer. The one thing different is that she does seem to have a rash where her right arm is contracted and being held to the thorax. The area underlying both on the ventral side of the arm as well is the thorax where it comes in contact shows evidence of a rash which appears to be fungal in nature. There does not appear to be any evidence of infection lies at this point. There does not appear to be a rash consistent with shingles which was  also of concern initially. No fevers, chills, nausea, or vomiting noted at this time. 12/29/17 on evaluation today patient appears to be doing a little better in regard to her sacral wound although the one changes the 12 o'clock location of the wound seems to have attached at one point which no longer allows this to pull back. This has caused an area of undermining that seems to be attaching as well in the 12 o'clock location. I do think that this is something that can be managed and is not necessarily a bad thing. Nonetheless she does seem to have some pain with exploration of this region of undermining. She continues to have an area under her right arm on the chest wall of erythema although this is a little better especially after the home health nurse is actually got in order for Diflucan times one for the patient. She is a new area on the left wrist that looks like because of the contraction her nail may have pushed in on her wrist area causing a slight cut which subsequently became infected. She has  some honey crusted drainage noted. 03/09/18 undervaluation today patient appears to be doing fairly well in regard to her wounds in general. She has been tolerating the dressing changes without complication. Overall I'm pleased with the progress that seems to be made week by week especially in regard to the sacral ulcer. Her daughter and the caregiver seem to take very good care of her. With that being said she has very little undermining in regard to the sacral wound in good granulation she also has good epithelialization noted. 03/23/18 on evaluation today patient appears to be doing rather well in regard to her sacral wound. Unfortunately she does have a little bit of necrotic tissue noted in the central portion of her wound on the left elbow. This is due to the fact that she is lying on this arm seeing how it is contracted. Currently her daughter has started to avoid lying on the side it all due to the fact that the necrotic tissue was noted. With that being said they have been taking very good care of her in my pinion. Fortunately there does not appear to be any evidence of infection which is good news. Overall I'm pleased with the progress she's made other than in regard to the elbow. 04/06/18 on evaluation today patient actually appears to be doing okay in regard to the sacral wound. In fact it appears to be somewhat smaller in general although there does appear to be more undermining at the 6 o'clock location than was previously noted. She has been tolerating the dressing changes without complication in general which is also good news. Nonetheless she does have a new small skin tear/opening on her leg which was evaluated today. Fortunately this appears to be minimal and the Xeroform gauze which has been used up to this point in the past has been utilized very effectively seems to be doing well in that regard. Her left elbow ulcer seems to also be doing excellent at this point making good  progress. 04/20/18 on evaluation today patient appears to be doing fairly well in regard to her sacral wound. This definitely seems to be better than during last evaluation where it was indeed infected. With that being said she has been tolerating the dressing changes as best it can be expected. Her elbow seems to be drying out and a lot of times the dressing is getting stuck according to her caregiver. Montfort,  Myles Gip ([id number]) 05/04/18-She is seen in follow-up evaluation for a sacral and left elbow pressure ulcer. These are stable/improved and we will continue with same treatment plan and she will follow-up in 2 weeks 05/18/18 on evaluation today patient actually appears to be doing better in regard to her left elbow ulcer. The sacral wound is also shown signs of improvement which is good news. With that being said she does have a new right medial lower extremity ulcer which actually appears to show some signs of cellulitis/infection. She is previously taken Augmentin with good result. Nonetheless the patient really does not have anything that I can culture at the site there's actually some good epithelialization noted although again there is some cellulitis appearance as well. 06/01/18 on evaluation today patient appears to actually be doing very well in general in regard to her left elbow wound which is smaller in her lower extremity ulcer which is actually healed. With that being said the sacral wound in particular show signs of improvement although she still has undermining in the six-7 o'clock location. No fevers, chills, nausea, or vomiting noted at this time. 06/15/18 on evaluation today patient actually appears to be doing excellent in regard to her elbow ulcer on the left elbow. This is shown signs of great improvement and I do feel like she is very close to healing in this regard. In general her sacral wound also appears to be doing well there's good sign of improvement there as well as  far as the overall surface of the wound is concerned. She does have a significant area of tunneling at the 6 o'clock location that still is about the same really there's no significant improvement in that regard. Nonetheless we are attempting to try and treat this by way of packing with Prisma followed by silver so to help control moisture. The patient been tolerating the dressing changes without complication. 06/29/18 on evaluation today patient actually appears to be doing very well in regard to her left elbow ulcer and her sacral ulcer. She has been tolerating the dressing changes without complication which is good news. In fact the sacral wound appears to have almost completely closed although she does have an area of undermining/tunneling at roughly the 7 o'clock location that is still unfortunately having a more difficult time closing although as opposed to during the last visit I was unable to get a normal Q-tip down into the undermined region. Actually had to use a skinny probe or else the backside of the Q-tip were just a wooden stake was. I think this is a good sign that this area seems to be filling in which is wonderful. Overall I'm pleased with how things are progressing. Electronic Signature(s) Signed: 06/30/2018 8:03:16 PM By: Lenda Kelp PA-C Entered By: Lenda Kelp on 06/29/2018 10:55:58 [name] ([id number]) -------------------------------------------------------------------------------- Physical Exam Details Patient Name: [name] Date of Service: 06/29/2018 10:00 AM Medical Record Number: [id number] Patient Account Number: [id number] Date of Birth/Sex: Jan 31, 1923 (84 y.o. F) Treating RN: Curtis Sites Primary Care Provider: Vonita Moss Other Clinician: Referring Provider: Vonita Moss Treating Provider/Extender: STONE III, HOYT Weeks in Treatment: 52 Constitutional Chronically ill appearing but in no apparent acute  distress. Respiratory normal breathing without difficulty. Psychiatric Patient is not able to cooperate in decision making regarding care. Patient has dementia. pleasant and cooperative. Notes Patient's wound bed at this time actually shows evidence of good improvement. She has been tolerating the dressing changes without complication there does not appear  to be any evidence of infection. Overall I'm very pleased with how things are in appearance. Still she is going to need some additional healing in regard to the tunnel in particular in order for this to get to the point to where the wound completely closes. Nonetheless I think she has been making great progress. Electronic Signature(s) Signed: 06/30/2018 8:03:16 PM By: Lenda Kelp PA-C Entered By: Lenda Kelp on 06/29/2018 10:56:49 [name] ([id number]) -------------------------------------------------------------------------------- Physician Orders Details Patient Name: [name] Date of Service: 06/29/2018 10:00 AM Medical Record Number: [id number] Patient Account Number: [id number] Date of Birth/Sex: 1923-06-05 (84 y.o. F) Treating RN: Curtis Sites Primary Care Provider: Vonita Moss Other Clinician: Referring Provider: Vonita Moss Treating Provider/Extender: Linwood Dibbles, HOYT Weeks in Treatment: 46 Verbal / Phone Orders: No Diagnosis Coding ICD-10 Coding Code Description L89.153 Pressure ulcer of sacral region, stage 3 L89.023 Pressure ulcer of left elbow, stage 3 G20 Parkinson's disease L03.312 Cellulitis of back [any part except buttock] S81.811D Laceration without foreign body, right lower leg, subsequent encounter Wound Cleansing Wound #1 Midline Coccyx o Clean wound with Normal Saline. o May Shower, gently pat wound dry prior to applying new dressing. Wound #9 Left Elbow o Clean wound with Normal Saline. o May Shower, gently pat wound dry prior to applying new dressing. Anesthetic  (add to Medication List) Wound #1 Midline Coccyx o Topical Lidocaine 4% cream applied to wound bed prior to debridement (In Clinic Only). Wound #9 Left Elbow o Topical Lidocaine 4% cream applied to wound bed prior to debridement (In Clinic Only). Skin Barriers/Peri-Wound Care Wound #1 Midline Coccyx o Skin Prep - use only where the allevyn sticks not on reddened areas o Antifungal powder-Nystatin - around peri-wound on coccyx (do not get in wound), under right breast and right arm, right abdomen Primary Wound Dressing Wound #1 Midline Coccyx o Silver Alginate - silvercel Ag non-adherent o Silver Collagen - prisma ag and pack into undermining as well slightly moisten with hydrogel or [place] Jelly and then place silvercel Ag non-adherent on top of the silver collagen Wound #9 Left Elbow o Xeroform Secondary Dressing Wound #1 Midline Coccyx ADITHI, GAMMON. ([id number]) o Boardered Foam Dressing - HHRN PLEASE ORDER ALLEVYN LIFE BORDERED FOAM FOR PATIENT (OFF BRANDS IRRITATE HER SKIN) Wound #9 Left Elbow o Foam o Non-adherent pad o Other - stretch netting #4 Dressing Change Frequency Wound #1 Midline Coccyx o Change dressing every day. Wound #9 Left Elbow o Change dressing every day. Follow-up Appointments o Return Appointment in 2 weeks. Off-Loading Wound #1 Midline Coccyx o Roho cushion for wheelchair o Mattress - fluidized air mattress o Turn and reposition every 2 hours Additional Orders / Instructions Wound #1 Midline Coccyx o Increase protein intake. - please add protein supplements to patients diet o Other: - please add vitamin A, vitamin C and zinc supplements to patients diet Wound #9 Left Elbow o Increase protein intake. - please add protein supplements to patients diet o Other: - please add vitamin A, vitamin C and zinc supplements to patients diet Home Health Wound #1 Midline Coccyx o Continue Home Health Visits -  [facility] o Home Health Nurse may visit PRN to address patientos wound care needs. o FACE TO FACE ENCOUNTER: MEDICARE and MEDICAID PATIENTS: I certify that this patient is under my care and that I had a face-to-face encounter that meets the physician face-to-face encounter requirements with this patient on this date. The encounter with the patient was in whole or  in part for the following MEDICAL CONDITION: (primary reason for Home Healthcare) MEDICAL NECESSITY: I certify, that based on my findings, NURSING services are a medically necessary home health service. HOME BOUND STATUS: I certify that my clinical findings support that this patient is homebound (i.e., Due to illness or injury, pt requires aid of supportive devices such as crutches, cane, wheelchairs, walkers, the use of special transportation or the assistance of another person to leave their place of residence. There is a normal inability to leave the home and doing so requires considerable and taxing effort. Other absences are for medical reasons / religious services and are infrequent or of short duration when for other reasons). o If current dressing causes regression in wound condition, may D/C ordered dressing product/s and apply Normal Saline Moist Dressing daily until next Wound Healing Center / Other MD appointment. Notify Wound Healing Center of regression in wound condition at [phone]. o Please direct any NON-WOUND related issues/requests for orders to patient's Primary Care Physician - Dr Vonita Moss Wound #9 Left Elbow o Continue Home Health Visits - [facility] o Home Health Nurse may visit PRN to address patientos wound care needs. KEWANA, SANON ([id number]) o FACE TO FACE ENCOUNTER: MEDICARE and MEDICAID PATIENTS: I certify that this patient is under my care and that I had a face-to-face encounter that meets the physician face-to-face encounter requirements with this patient on this date. The  encounter with the patient was in whole or in part for the following MEDICAL CONDITION: (primary reason for Home Healthcare) MEDICAL NECESSITY: I certify, that based on my findings, NURSING services are a medically necessary home health service. HOME BOUND STATUS: I certify that my clinical findings support that this patient is homebound (i.e., Due to illness or injury, pt requires aid of supportive devices such as crutches, cane, wheelchairs, walkers, the use of special transportation or the assistance of another person to leave their place of residence. There is a normal inability to leave the home and doing so requires considerable and taxing effort. Other absences are for medical reasons / religious services and are infrequent or of short duration when for other reasons). o If current dressing causes regression in wound condition, may D/C ordered dressing product/s and apply Normal Saline Moist Dressing daily until next Wound Healing Center / Other MD appointment. Notify Wound Healing Center of regression in wound condition at [phone]. o Please direct any NON-WOUND related issues/requests for orders to patient's Primary Care Physician - Dr Vonita Moss Electronic Signature(s) Signed: 06/29/2018 5:00:45 PM By: Curtis Sites Signed: 06/30/2018 8:03:16 PM By: Lenda Kelp PA-C Entered By: Curtis Sites on 06/29/2018 10:26:41 [name] ([id number]) -------------------------------------------------------------------------------- Problem List Details Patient Name: [name] Date of Service: 06/29/2018 10:00 AM Medical Record Number: [id number] Patient Account Number: [id number] Date of Birth/Sex: 02-May-1923 (84 y.o. F) Treating RN: Phillis Haggis Primary Care Provider: Vonita Moss Other Clinician: Referring Provider: Vonita Moss Treating Provider/Extender: Linwood Dibbles, HOYT Weeks in Treatment: 73 Active Problems ICD-10 Evaluated Encounter Code Description  Active Date Today Diagnosis L89.153 Pressure ulcer of sacral region, stage 3 01/07/2017 No Yes L89.023 Pressure ulcer of left elbow, stage 3 03/23/2018 No Yes G20 Parkinson's disease 01/07/2017 No Yes L03.312 Cellulitis of back [any part except buttock] 07/28/2017 No Yes S81.811D Laceration without foreign body, right lower leg, subsequent 08/11/2017 No Yes encounter Inactive Problems Resolved Problems Electronic Signature(s) Signed: 06/30/2018 8:03:16 PM By: Lenda Kelp PA-C Entered By: Lenda Kelp on 06/29/2018 10:18:32 Dangerfield, [name]  [name] ([id number]) -------------------------------------------------------------------------------- Progress Note Details Patient Name: [name], [name] Date of Service: 06/29/2018 10:00 AM Medical Record Number: [id number] Patient Account Number: [id number] Date of Birth/Sex: [birth year] (84 y.o. F) Treating RN: Curtis Sites Primary Care Provider: Vonita Moss Other Clinician: Referring Provider: Vonita Moss Treating Provider/Extender: Linwood Dibbles, HOYT Weeks in Treatment: 62 Subjective Chief Complaint Information obtained from Patient multiple wounds/ulcers History of Present Illness (HPI) 01/07/17 this is a 84 year old woman admitted to the clinic today for review of a pressure ulcer on her lower sacrum. She is referred from her primary physician's office after being seen on 3/22 with a 3 cm pressure area. Her daughter and caretaker accompanied her today state that the area first became obvious about a month ago and his since deteriorated. They have recently got [name] home health involved and have been using [name] to the wound. They have ordered a pressure relief surface for her mattress. They are turning her religiously. They state that she eats well and they've been forcing fluids on her. She is on a multivitamin. Looking through [facility] point last albumin I see was 4.4 on 10/28. The patient has advanced parkinsonism which looks  superficially like advanced Parkinson's disease although her daughter tells me she did not ever respond to Sinemet therefore this may have another pathology with signs of parkinsonism. However I think this is largely a mute point currently. She also has dementia and is nonambulatory. Since this started they have been keeping her in bed and turning her religiously every 2 hours. She lives at home in [place] with her husband with 24/7 care giving 01/13/17 santyl change qd. Still will require further debridement. continue santyl. 01/20/17; patient's wound actually looks some better less adherent necrotic surface. There is actually visible granulation. We're using [name] 01/27/17; better-looking surface but still a lot of necrotic tissue on the base of this wound. The periwound erythema is better than last week we are still using [name]. Her daughter tells [place] that she is still having trouble with the pressure-relief mattress through medical modalities 02/03/18; I had the patient scheduled for a two week followup however her daughter brought her in early concerned for discoloration on 2 areas of the wound circumference. We have bee using santyl 02/10/17;Better looking surface to the wound. Rim appears better suggesting better offloading. Using santyl 02/24/17; change to Silver Collegen last time. Wound appears better. 03/10/17; still using silver collagen religious offloading. Intake is satisfactory per her daughter. Dimension slightly better 03/12/2017 -- Dr. Jannetta Quint patient who had been seen 2 days ago and was doing fairly well. The patient is brought in by her daughter who noticed a new wound just above the previous wound on her sacral area and going on more to the left lateral side. She was very concerned and we asked her to get in for an opinion. 03/17/17; above is noted. The patient has developed a progressive area to the left of her original wound. This seems to this started with a ring of red skin  with a more pale interior almost looking fungal. There was a rim of blister through part of the area although this did not look like zoster. They have been applying triamcinolone that was prescribed last week by Dr. Meyer Russel and the area has a fold to a linear band area which is confluent, erythematous and with obvious epidermal swelling but there is no overt tenderness or crepitus. She has lost some surface epithelium closer to the wound surface itself and now has  a more superficial wound in this area and the extending erythema goes towards the left buttock. This is well demarcated between involved in normal skin but once again does not appear to be at all tender. If there is a contact issue here I cannot get the history out of the daughter or the caregiver that are with her. 03/24/17; the patient arrives today with the wound slightly worse slightly more drainage. The bandlike area of erythema that I treated as a possible pineal infection has improved somewhat although proximally is still has confluent erythema without overt tenderness. We have been using silver alginate since the most recent deterioration. To the bandlike degree of erythema we have been using Lotrisone cream 03/31/17; patient arrives today with the wound slightly larger, necrotic surface and surrounding erythema. The bandlike area of erythema that I treated as a possible pineal infection is less swollen but still present I've been using Lotrisone cream on that largely related to the presence of a tinea looking infection when this was first seen. We've been using silver alginate. SHERINA, STAMMER ([id number]) X-ray that I ordered last week showed no acute bony abnormalities mild fecal impaction. Lab work showed a comprehensive metabolic panel that was normal including an albumin of 3.8. White count was 9.5 hemoglobin 12.3 differential count normal. C-reactive protein was less than 1 and sedimentation rate and 17. The latter 2 values  does not support an ongoing bacterial infection. 04/14/17; patient arrives after a 2 week hiatus. Her wound is not in good condition. Although the base of the wound looks stable she still has an erythematous area that was apparently blistered over the weekend. This again points to the left. As our intake nurse pointed out today this is in the area where the tissue folds together and we may need to prevent try to prevent this. Lab work and x-ray that I did to 3 weeks ago were unremarkable including her albumin nevertheless she is an extremely frail condition physically. We have have been using [name] 04/22/17; I changed her to silver alginate because of the surrounding maceration and moisture last week. The daughter did not like the way the wound looked in the middle of the week and changed her back to Omro. They're putting gauze on top of this. Thinks still using Lotrisone. 05/06/17 on evaluation today patient sacral wound appears to be doing okay and does not seem to be any worse. She is having no significant pain during evaluation today the secondary to mental status she is unable to rate or describe whether she had any pain she was not however flinching. Her daughter states that the wound does appear to be looking better to her. Still we are having difficulty with the skinfold that seems to be closing in on itself at this point. All in all I feel like she is making some good progress in the [name] seems to be the official for her. They do tell me that a refill if we are gonna continue that today. No fevers, chills, nausea, or vomiting noted at this time. 05/13/17 presents today for evaluation concerning her ongoing sacral pressure ulcer. Unfortunately she also has an area of deep tissue injury in the right Ischial region which is starting to show up. The sacral wound also continues to show signs of necrotic tissue overlying and has declined. Overall we really have not seen a significant improvement  in the past several months in regard to the sacral wound and now patient is starting to develop a  new wound in the right Ischial region. Obviously this is not trending in the direction that we want to see. No fevers, chills, nausea, or vomiting noted at this time. 05/19/17; I have not seen this wound and almost a month however there is nothing really positive to say about it. Necrotic tissue over the surface which superiorly I think abuts on her sacrum. She has surrounding erythema. I would be surprised if there is not underlying osteomyelitis or soft tissue infection. 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 She apparently eats well per description although I wonder about this looking at her. Lab work I did probably 4 weeks ago however was really quite normal including a serum albumin 05/26/17; culture I did last week grew Escherichia coli and methicillin sensitive staph aureus which should've been well covered by the Augmentin and ciprofloxacin. Indeed the erythema around the wound in the bed of the wound looks somewhat better. Her intake is still satisfactory. They now have a near fluidized bed 06/02/17; they completed the antibiotics last Friday. Using collagen. Daughter still reports eating and drinking well. There is less visualized erythema around the wound. 06/16/17; large pressure ulcer over her lower sacrum and coccyx. Using [name] to the wound bed. 06/30/17; certainly no change in dimensions of this large stage III wound over her sacrum and coccyx. They've been using [name]. There is no exposed bone. She has a candidal/tinea area in the right inguinal area. Other than that her daughter relates that she is eating and drinking well there changing her positioning to make sure the areas offloaded 07/14/17; no major change in the dimensions of this large stage III wound. Initially a smaller wound that became secondarily infected causing significant tissue breakdown although it is been  stable in the last several weeks. Tunneling superiorly at roughly 1:00 no change here either. There is no bone palpable. Both the patient's daughter and caretaker states that she eats well. I have not rechecked her blood work 07/27/17; patient arrives in clinic today and generally a deteriorated looking state. Mild fever with axillary temperature of 100.4. Daughter reports she has not been eating and drinking well since yesterday. She looks more pale and thin and less responsive. We have been using silver collagen to her wound 08/11/17; since the last time the patient was here things have gone better. Her fever went down and she started eating and drinking again. Culture I did of the wound showed methicillin sensitive staph aureus, Morganella and enterococcus. I only treated her with Keflex which would've not covered the Morganella and enterococcus however the purulent area on the 2:00 side of her wound is a lot better and the bandlike erythema that concern me also was resolved. I'd called the daughter last week to confirm that she was a lot better. She finished the Keflex last Thursday she also suffered a skin tear this morning perhaps while putting on her incontinence brief period is on the lateral aspect of her right leg. Clean wound with the surface epithelium not viable. 08/25/17; last week the patient was noted to have erythema around the wound margin and a slight fever which the patient's daughter says was 97. Our office was contacted by home health however we did not have a space to work the patient in that she went to see her primary physician Dr. Maurice March. She was not febrile during this visit on 08/21/17 there was erythema around the wound similar to last occasion. Dr. Maurice March in reference to my culture from  07/28/17 and put her on doxycycline capsules which they're opening twice a day for 10 days. [name], [name] ([id number]) 09/17/17; patient arrives today with the wound bed looking fairly  well granulated. There is undermining from 4 to 6:00 although this seems to of contracted slightly. She does not have obvious infection although the daughter states there was some darkening of the wound circumference that is not evident today. They state she is eating well. They are concerned about oral thrush 09/30/16; very fibrin looking granulated wound bed. Her undermining from 4 to 6:00 is about the same but also appears to be well granulated. She has rolled edges of senescent tissue from roughly 7 to 12:00. There is no evidence of infection 10/13/17 on evaluation today patient appears to be doing fairly well all things considered in regard to her sacral wound. There's really not a lot of significant change or improvement she does have some evidence of contusion and deep tissue injury around the left border of the wound that patient's daughter did inquire about today. Nonetheless overall the wound appears to be doing about the same in my opinion. There is no significant indication of infection there also is no significant slough noted at this point. 10/27/17 She is here in follow up evaluation of a sacral ulcer. She is accompanied by her daughter and caregiver. There is red granulation tissue throughout, persistent discoloration to left border; this appears consistent with deep tissue injury. There are multiple areas covered in foam borders, tegaderm, etc that are "preventative" with "no wounds". We will continue with prisma and continue with two week follow ups 11/17/17 on evaluation today patient appears to be doing decently well in regard to her wound at this point. She continues to have a sacral wound ulcer. We see her roughly every two weeks. In the last week she was actually in the hospital due to what was diagnosed as sepsis although no organisms were ever identified in the actual blood cultures. The hospital really was not sure that the wound was the cause of the infection but they really  did not figure out anything else that would be a causative organism she did have a CT scan and that revealed no evidence of pneumonia there was also no evidence of urinary tract infection. Again it very well could have been the wound called in those although wound appears to be doing fairly well at this point. 12/15/17 on evaluation today patient actually appears to be doing about the same in regard to her sacral ulcer. The one thing different is that she does seem to have a rash where her right arm is contracted and being held to the thorax. The area underlying both on the ventral side of the arm as well is the thorax where it comes in contact shows evidence of a rash which appears to be fungal in nature. There does not appear to be any evidence of infection lies at this point. There does not appear to be a rash consistent with shingles which was also of concern initially. No fevers, chills, nausea, or vomiting noted at this time. 12/29/17 on evaluation today patient appears to be doing a little better in regard to her sacral wound although the one changes the 12 o'clock location of the wound seems to have attached at one point which no longer allows this to pull back. This has caused an area of undermining that seems to be attaching as well in the 12 o'clock location. I do think that this  is something that can be managed and is not necessarily a bad thing. Nonetheless she does seem to have some pain with exploration of this region of undermining. She continues to have an area under her right arm on the chest wall of erythema although this is a little better especially after the home health nurse is actually got in order for Diflucan times one for the patient. She is a new area on the left wrist that looks like because of the contraction her nail may have pushed in on her wrist area causing a slight cut which subsequently became infected. She has some honey crusted drainage noted. 03/09/18  undervaluation today patient appears to be doing fairly well in regard to her wounds in general. She has been tolerating the dressing changes without complication. Overall I'm pleased with the progress that seems to be made week by week especially in regard to the sacral ulcer. Her daughter and the caregiver seem to take very good care of her. With that being said she has very little undermining in regard to the sacral wound in good granulation she also has good epithelialization noted. 03/23/18 on evaluation today patient appears to be doing rather well in regard to her sacral wound. Unfortunately she does have a little bit of necrotic tissue noted in the central portion of her wound on the left elbow. This is due to the fact that she is lying on this arm seeing how it is contracted. Currently her daughter has started to avoid lying on the side it all due to the fact that the necrotic tissue was noted. With that being said they have been taking very good care of her in my pinion. Fortunately there does not appear to be any evidence of infection which is good news. Overall I'm pleased with the progress she's made other than in regard to the elbow. 04/06/18 on evaluation today patient actually appears to be doing okay in regard to the sacral wound. In fact it appears to be somewhat smaller in general although there does appear to be more undermining at the 6 o'clock location than was previously noted. She has been tolerating the dressing changes without complication in general which is also good news. Nonetheless she does have a new small skin tear/opening on her leg which was evaluated today. Fortunately this appears to be minimal and the Xeroform gauze which has been used up to this point in the past has been utilized very effectively seems [name], [name]. ([id number]) to be doing well in that regard. Her left elbow ulcer seems to also be doing excellent at this point making good progress. 04/20/18  on evaluation today patient appears to be doing fairly well in regard to her sacral wound. This definitely seems to be better than during last evaluation where it was indeed infected. With that being said she has been tolerating the dressing changes as best it can be expected. Her elbow seems to be drying out and a lot of times the dressing is getting stuck according to her caregiver. 05/04/18-She is seen in follow-up evaluation for a sacral and left elbow pressure ulcer. These are stable/improved and we will continue with same treatment plan and she will follow-up in 2 weeks 05/18/18 on evaluation today patient actually appears to be doing better in regard to her left elbow ulcer. The sacral wound is also shown signs of improvement which is good news. With that being said she does have a new right medial lower extremity ulcer which actually  appears to show some signs of cellulitis/infection. She is previously taken Augmentin with good result. Nonetheless the patient really does not have anything that I can culture at the site there's actually some good epithelialization noted although again there is some cellulitis appearance as well. 06/01/18 on evaluation today patient appears to actually be doing very well in general in regard to her left elbow wound which is smaller in her lower extremity ulcer which is actually healed. With that being said the sacral wound in particular show signs of improvement although she still has undermining in the six-7 o'clock location. No fevers, chills, nausea, or vomiting noted at this time. 06/15/18 on evaluation today patient actually appears to be doing excellent in regard to her elbow ulcer on the left elbow. This is shown signs of great improvement and I do feel like she is very close to healing in this regard. In general her sacral wound also appears to be doing well there's good sign of improvement there as well as far as the overall surface of the wound  is concerned. She does have a significant area of tunneling at the 6 o'clock location that still is about the same really there's no significant improvement in that regard. Nonetheless we are attempting to try and treat this by way of packing with Prisma followed by silver so to help control moisture. The patient been tolerating the dressing changes without complication. 06/29/18 on evaluation today patient actually appears to be doing very well in regard to her left elbow ulcer and her sacral ulcer. She has been tolerating the dressing changes without complication which is good news. In fact the sacral wound appears to have almost completely closed although she does have an area of undermining/tunneling at roughly the 7 o'clock location that is still unfortunately having a more difficult time closing although as opposed to during the last visit I was unable to get a normal Q-tip down into the undermined region. Actually had to use a skinny probe or else the backside of the Q-tip were just a wooden stake was. I think this is a good sign that this area seems to be filling in which is wonderful. Overall I'm pleased with how things are progressing. Patient History Unable to Obtain Patient History due to Altered Mental Status. Information obtained from Patient. Social History Never smoker, Marital Status - Married, Alcohol Use - Never, Drug Use - No History, Caffeine Use - Never. Medical And Surgical History Notes Ear/Nose/Mouth/Throat dysphagia Neurologic parkinsons, tremor Review of Systems (ROS) Constitutional Symptoms (General Health) Denies complaints or symptoms of Fever, Chills. Respiratory The patient has no complaints or symptoms. Cardiovascular The patient has no complaints or symptoms. Psychiatric The patient has no complaints or symptoms. LEAIRA, FULLAM ([id number]) Objective Constitutional Chronically ill appearing but in no apparent acute distress. Vitals Time Taken: 9:40  AM, Height: 60 in, Weight: 90 lbs, BMI: 17.6, Temperature: 97.8 F, Pulse: 62 bpm, Respiratory Rate: 14 breaths/min, Blood Pressure: 138/54 mmHg. Respiratory normal breathing without difficulty. Psychiatric Patient is not able to cooperate in decision making regarding care. Patient has dementia. pleasant and cooperative. General Notes: Patient's wound bed at this time actually shows evidence of good improvement. She has been tolerating the dressing changes without complication there does not appear to be any evidence of infection. Overall I'm very pleased with how things are in appearance. Still she is going to need some additional healing in regard to the tunnel in particular in order for this to get to the  point to where the wound completely closes. Nonetheless I think she has been making great progress. Integumentary (Hair, Skin) Wound #1 status is Open. Original cause of wound was Pressure Injury. The wound is located on the Midline Coccyx. The wound measures 2cm length x 0.5cm width x 0.4cm depth; 0.785cm^2 area and 0.314cm^3 volume. There is muscle and Fat Layer (Subcutaneous Tissue) Exposed exposed. There is tunneling at 7:00 with a maximum distance of 2.4cm. There is a medium amount of serosanguineous drainage noted. The wound margin is epibole. There is large (67-100%) red, hyper - granulation within the wound bed. There is no necrotic tissue within the wound bed. The periwound skin appearance exhibited: Induration, Rash, Maceration. The periwound skin appearance did not exhibit: Callus, Crepitus, Excoriation, Scarring, Dry/Scaly, Atrophie Blanche, Cyanosis, Ecchymosis, Hemosiderin Staining, Mottled, Pallor, Rubor, Erythema. Periwound temperature was noted as No Abnormality. The periwound has tenderness on palpation. Wound #9 status is Open. Original cause of wound was Trauma. The wound is located on the Left Elbow. The wound measures 0.2cm length x 0.1cm width x 0.1cm depth; 0.016cm^2  area and 0.002cm^3 volume. The wound is limited to skin breakdown. There is no tunneling noted. There is a medium amount of serosanguineous drainage noted. The wound margin is flat and intact. There is large (67-100%) granulation within the wound bed. There is no necrotic tissue within the wound bed. The periwound skin appearance did not exhibit: Callus, Crepitus, Excoriation, Induration, Rash, Scarring, Dry/Scaly, Maceration, Atrophie Blanche, Cyanosis, Ecchymosis, Hemosiderin Staining, Mottled, Pallor, Rubor, Erythema. Assessment Active Problems ICD-10 Pressure ulcer of sacral region, stage 3 Pressure ulcer of left elbow, stage 3 Parkinson's disease Cellulitis of back [any part except buttock] [name], [name]. ([id number]) Laceration without foreign body, right lower leg, subsequent encounter Plan Wound Cleansing: Wound #1 Midline Coccyx: Clean wound with Normal Saline. May Shower, gently pat wound dry prior to applying new dressing. Wound #9 Left Elbow: Clean wound with Normal Saline. May Shower, gently pat wound dry prior to applying new dressing. Anesthetic (add to Medication List): Wound #1 Midline Coccyx: Topical Lidocaine 4% cream applied to wound bed prior to debridement (In Clinic Only). Wound #9 Left Elbow: Topical Lidocaine 4% cream applied to wound bed prior to debridement (In Clinic Only). Skin Barriers/Peri-Wound Care: Wound #1 Midline Coccyx: Skin Prep - use only where the allevyn sticks not on reddened areas Antifungal powder-Nystatin - around peri-wound on coccyx (do not get in wound), under right breast and right arm, right abdomen Primary Wound Dressing: Wound #1 Midline Coccyx: Silver Alginate - silvercel Ag non-adherent Silver Collagen - prisma ag and pack into undermining as well slightly moisten with hydrogel or KY Jelly and then place silvercel Ag non-adherent on top of the silver collagen Wound #9 Left Elbow: Xeroform Secondary Dressing: Wound #1  Midline Coccyx: Boardered Foam Dressing - HHRN PLEASE ORDER ALLEVYN LIFE BORDERED FOAM FOR PATIENT (OFF BRANDS IRRITATE HER SKIN) Wound #9 Left Elbow: Foam Non-adherent pad Other - stretch netting #4 Dressing Change Frequency: Wound #1 Midline Coccyx: Change dressing every day. Wound #9 Left Elbow: Change dressing every day. Follow-up Appointments: Return Appointment in 2 weeks. Off-Loading: Wound #1 Midline Coccyx: Roho cushion for wheelchair Mattress - fluidized air mattress Turn and reposition every 2 hours Additional Orders / Instructions: Wound #1 Midline Coccyx: Increase protein intake. - please add protein supplements to patients diet Other: - please add vitamin A, vitamin C and zinc supplements to patients diet Wound #9 Left Elbow: Increase protein intake. - please add protein  supplements to patients diet HARGUN, SPURLING. ([id number]) Other: - please add vitamin A, vitamin C and zinc supplements to patients diet Home Health: Wound #1 Midline Coccyx: Continue Home Health Visits - [facility] Nurse may visit PRN to address patient s wound care needs. FACE TO FACE ENCOUNTER: MEDICARE and MEDICAID PATIENTS: I certify that this patient is under my care and that I had a face-to-face encounter that meets the physician face-to-face encounter requirements with this patient on this date. The encounter with the patient was in whole or in part for the following MEDICAL CONDITION: (primary reason for Home Healthcare) MEDICAL NECESSITY: I certify, that based on my findings, NURSING services are a medically necessary home health service. HOME BOUND STATUS: I certify that my clinical findings support that this patient is homebound (i.e., Due to illness or injury, pt requires aid of supportive devices such as crutches, cane, wheelchairs, walkers, the use of special transportation or the assistance of another person to leave their place of residence. There is a normal inability to  leave the home and doing so requires considerable and taxing effort. Other absences are for medical reasons / religious services and are infrequent or of short duration when for other reasons). If current dressing causes regression in wound condition, may D/C ordered dressing product/s and apply Normal Saline Moist Dressing daily until next Wound Healing Center / Other MD appointment. Notify Wound Healing Center of regression in wound condition at [phone]. Please direct any NON-WOUND related issues/requests for orders to patient's Primary Care Physician - Dr Vonita Moss Wound #9 Left Elbow: Continue Home Health Visits - [facility] Health Nurse may visit PRN to address patient s wound care needs. FACE TO FACE ENCOUNTER: MEDICARE and MEDICAID PATIENTS: I certify that this patient is under my care and that I had a face-to-face encounter that meets the physician face-to-face encounter requirements with this patient on this date. The encounter with the patient was in whole or in part for the following MEDICAL CONDITION: (primary reason for Home Healthcare) MEDICAL NECESSITY: I certify, that based on my findings, NURSING services are a medically necessary home health service. HOME BOUND STATUS: I certify that my clinical findings support that this patient is homebound (i.e., Due to illness or injury, pt requires aid of supportive devices such as crutches, cane, wheelchairs, walkers, the use of special transportation or the assistance of another person to leave their place of residence. There is a normal inability to leave the home and doing so requires considerable and taxing effort. Other absences are for medical reasons / religious services and are infrequent or of short duration when for other reasons). If current dressing causes regression in wound condition, may D/C ordered dressing product/s and apply Normal Saline Moist Dressing daily until next Wound Healing Center / Other MD  appointment. Notify Wound Healing Center of regression in wound condition at [phone]. Please direct any NON-WOUND related issues/requests for orders to patient's Primary Care Physician - Dr Vonita Moss I am going to suggest currently that we continue with the above wound care measures for the next week. The patient and her daughter as well as caregiver are in agreement with the plan. They have been taking excellent care of her and I recommend they continue to do the same thing over the next two weeks. If anything changes the meantime they will contact the office and let me know. Please see above for specific wound care orders. We will see patient for re-evaluation in 2 week(s)  here in the clinic. If anything worsens or changes patient will contact our office for additional recommendations. Electronic Signature(s) Signed: 06/30/2018 8:03:16 PM By: Lenda Kelp PA-C Entered By: Lenda Kelp on 06/29/2018 10:57:27 [name] ([id number]) -------------------------------------------------------------------------------- ROS/PFSH Details Patient Name: [name] Date of Service: 06/29/2018 10:00 AM Medical Record Number: [id number] Patient Account Number: [id number] Date of Birth/Sex: 05/23/1923 (84 y.o. F) Treating RN: Curtis Sites Primary Care Provider: Vonita Moss Other Clinician: Referring Provider: Vonita Moss Treating Provider/Extender: Linwood Dibbles, HOYT Weeks in Treatment: 38 Unable to Obtain Patient History due to oo Altered Mental Status Information Obtained From Patient Wound History Do you currently have one or more open woundso Yes How many open wounds do you currently haveo 1 Approximately how long have you had your woundso 1 week How have you been treating your wound(s) until nowo santyl and gauze Has your wound(s) ever healed and then re-openedo No Have you had any lab work done in the past montho No Have you tested positive for an antibiotic  resistant organism (MRSA, VRE)o No Have you tested positive for osteomyelitis (bone infection)o No Have you had any tests for circulation on your legso No Constitutional Symptoms (General Health) Complaints and Symptoms: Negative for: Fever; Chills Eyes Medical History: Negative for: Cataracts; Glaucoma; Optic Neuritis Ear/Nose/Mouth/Throat Medical History: Negative for: Chronic sinus problems/congestion; Middle ear problems Past Medical History Notes: dysphagia Hematologic/Lymphatic Medical History: Positive for: Anemia Negative for: Hemophilia; Human Immunodeficiency Virus; Lymphedema; Sickle Cell Disease Respiratory Complaints and Symptoms: No Complaints or Symptoms Medical History: Negative for: Aspiration; Asthma; Chronic Obstructive Pulmonary Disease (COPD); Pneumothorax; Sleep Apnea; Tuberculosis Cardiovascular MCKYNLEIGH, MUSSELL. ([id number]) Complaints and Symptoms: No Complaints or Symptoms Medical History: Positive for: Hypertension Negative for: Angina; Arrhythmia; Congestive Heart Failure; Coronary Artery Disease; Deep Vein Thrombosis; Hypotension; Myocardial Infarction; Peripheral Arterial Disease; Peripheral Venous Disease; Phlebitis; Vasculitis Gastrointestinal Medical History: Negative for: Cirrhosis ; Colitis; Crohnos; Hepatitis A; Hepatitis B; Hepatitis C Endocrine Medical History: Negative for: Type I Diabetes; Type II Diabetes Genitourinary Medical History: Negative for: End Stage Renal Disease Immunological Medical History: Negative for: Lupus Erythematosus; Raynaudos; Scleroderma Integumentary (Skin) Medical History: Negative for: History of Burn; History of pressure wounds Musculoskeletal Medical History: Negative for: Gout; Rheumatoid Arthritis; Osteoarthritis; Osteomyelitis Neurologic Medical History: Positive for: Dementia Negative for: Neuropathy; Quadriplegia; Paraplegia; Seizure Disorder Past Medical History Notes: parkinsons,  tremor Oncologic Medical History: Negative for: Received Chemotherapy; Received Radiation Psychiatric Complaints and Symptoms: No Complaints or Symptoms Medical History: Negative for: Anorexia/bulimia; Confinement Anxiety AGUEDA, HOUPT ([id number]) Immunizations Pneumococcal Vaccine: Received Pneumococcal Vaccination: Yes Immunization Notes: up to date Implantable Devices Family and Social History Never smoker; Marital Status - Married; Alcohol Use: Never; Drug Use: No History; Caffeine Use: Never; Financial Concerns: No; Food, Clothing or Shelter Needs: No; Support System Lacking: No; Transportation Concerns: No; Advanced Directives: Yes (Not Provided); Patient does not want information on Advanced Directives; Medical Power of Attorney: Yes - dtr (Not Provided) Physician Affirmation I have reviewed and agree with the above information. Electronic Signature(s) Signed: 06/29/2018 5:00:45 PM By: Curtis Sites Signed: 06/30/2018 8:03:16 PM By: Lenda Kelp PA-C Entered By: Lenda Kelp on 06/29/2018 10:56:12 [name] ([id number]) -------------------------------------------------------------------------------- SuperBill Details Patient Name: [name] Date of Service: 06/29/2018 Medical Record Number: [id number] Patient Account Number: [id number] Date of Birth/Sex: July 06, 1923 (84 y.o. F) Treating RN: Curtis Sites Primary Care Provider: Vonita Moss Other Clinician: Referring Provider: Vonita Moss Treating Provider/Extender: STONE III, HOYT Weeks in Treatment: 28  Diagnosis Coding ICD-10 Codes Code Description L89.153 Pressure ulcer of sacral region, stage 3 L89.023 Pressure ulcer of left elbow, stage 3 G20 Parkinson's disease L03.312 Cellulitis of back [any part except buttock] S81.811D Laceration without foreign body, right lower leg, subsequent encounter Facility Procedures CPT4 Code: [id number] Description: 99213 - WOUND CARE VISIT-LEV 3 EST  PT Modifier: Quantity: 1 Physician Procedures CPT4 Code: 5621308 Description: 99213 - WC PHYS LEVEL 3 - EST PT ICD-10 Diagnosis Description L89.153 Pressure ulcer of sacral region, stage 3 L89.023 Pressure ulcer of left elbow, stage 3 G20 Parkinson's disease L03.312 Cellulitis of back [any part except buttock] Modifier: Quantity: 1 Electronic Signature(s) Signed: 06/30/2018 8:03:16 PM By: Lenda Kelp PA-C Entered By: Lenda Kelp on 06/29/2018 10:57:56

## 2018-07-13 ENCOUNTER — Encounter: Payer: Medicare Other | Admitting: Physician Assistant

## 2018-07-13 ENCOUNTER — Telehealth: Payer: Self-pay | Admitting: Family Medicine

## 2018-07-13 DIAGNOSIS — L89153 Pressure ulcer of sacral region, stage 3: Secondary | ICD-10-CM | POA: Diagnosis not present

## 2018-07-13 NOTE — Telephone Encounter (Signed)
Copied from CRM [id number]. Topic: Quick Communication - See Telephone Encounter >> Jul 13, 2018  1:56 PM Terisa Starr wrote: CRM for notification. See Telephone encounter for: 07/13/18.   Natasha from [employer] [facility] called for a full 30 day wound care notes with the staging and measurements as well as signed by Dr Dossie Arbour. That can be faxed to [phone], attention :: Maija

## 2018-07-14 NOTE — Telephone Encounter (Signed)
Is there a fax for me to sign?

## 2018-07-16 NOTE — Progress Notes (Signed)
[name], [name] ([id number]) Visit Report for 07/13/2018 Arrival Information Details Patient Name: [name], [name] Date of Service: 07/13/2018 10:00 AM Medical Record Number: [id number] Patient Account Number: [id number] Date of Birth/Sex: 05-26-1923 (84 y.o. F) Treating RN: Rema Jasmine Primary Care Sie Formisano: Vonita Moss Other Clinician: Referring Britny Riel: Vonita Moss Treating Abdinasir Spadafore/Extender: Linwood Dibbles, HOYT Weeks in Treatment: 52 Visit Information History Since Last Visit Added or deleted any medications: No Patient Arrived: Wheel Chair Any new allergies or adverse reactions: No Arrival Time: 10:15 Had a fall or experienced change in No Accompanied By: daughter and activities of daily living that may affect caregiver risk of falls: Transfer Assistance: Manual Signs or symptoms of abuse/neglect since last visito No Patient Identification Verified: Yes Hospitalized since last visit: No Secondary Verification Process Yes Implantable device outside of the clinic excluding No Completed: cellular tissue based products placed in the center Patient Requires Transmission-Based No since last visit: Precautions: Has Dressing in Place as Prescribed: Yes Patient Has Alerts: No Pain Present Now: No Electronic Signature(s) Signed: 07/13/2018 5:26:03 PM By: Curtis Sites Entered By: Curtis Sites on 07/13/2018 10:52:39 [name] ([id number]) -------------------------------------------------------------------------------- Clinic Level of Care Assessment Details Patient Name: [name] Date of Service: 07/13/2018 10:00 AM Medical Record Number: [id number] Patient Account Number: [id number] Date of Birth/Sex: 1922-12-20 (84 y.o. F) Treating RN: Curtis Sites Primary Care Lakaisha Danish: Vonita Moss Other Clinician: Referring Tesean Stump: Vonita Moss Treating Ortha Metts/Extender: Linwood Dibbles, HOYT Weeks in Treatment: 50 Clinic Level of Care Assessment Items TOOL 4  Quantity Score []  - Use when only an EandM is performed on FOLLOW-UP visit 0 ASSESSMENTS - Nursing Assessment / Reassessment X - Reassessment of Co-morbidities (includes updates in patient status) 1 10 X- 1 5 Reassessment of Adherence to Treatment Plan ASSESSMENTS - Wound and Skin Assessment / Reassessment []  - Simple Wound Assessment / Reassessment - one wound 0 X- 2 5 Complex Wound Assessment / Reassessment - multiple wounds []  - 0 Dermatologic / Skin Assessment (not related to wound area) ASSESSMENTS - Focused Assessment []  - Circumferential Edema Measurements - multi extremities 0 []  - 0 Nutritional Assessment / Counseling / Intervention []  - 0 Lower Extremity Assessment (monofilament, tuning fork, pulses) []  - 0 Peripheral Arterial Disease Assessment (using hand held doppler) ASSESSMENTS - Ostomy and/or Continence Assessment and Care []  - Incontinence Assessment and Management 0 []  - 0 Ostomy Care Assessment and Management (repouching, etc.) PROCESS - Coordination of Care X - Simple Patient / Family Education for ongoing care 1 15 []  - 0 Complex (extensive) Patient / Family Education for ongoing care []  - 0 Staff obtains Chiropractor, Records, Test Results / Process Orders []  - 0 Staff telephones HHA, Nursing Homes / Clarify orders / etc []  - 0 Routine Transfer to another Facility (non-emergent condition) []  - 0 Routine Hospital Admission (non-emergent condition) []  - 0 New Admissions / Manufacturing engineer / Ordering NPWT, Apligraf, etc. []  - 0 Emergency Hospital Admission (emergent condition) X- 1 10 Simple Discharge Coordination [name], [name]. ([id number]) []  - 0 Complex (extensive) Discharge Coordination PROCESS - Special Needs []  - Pediatric / Minor Patient Management 0 []  - 0 Isolation Patient Management []  - 0 Hearing / Language / Visual special needs []  - 0 Assessment of Community assistance (transportation, D/C planning, etc.) []  - 0 Additional  assistance / Altered mentation []  - 0 Support Surface(s) Assessment (bed, cushion, seat, etc.) INTERVENTIONS - Wound Cleansing / Measurement []  - Simple Wound Cleansing - one wound 0 X- 2 5 Complex  Wound Cleansing - multiple wounds X- 1 5 Wound Imaging (photographs - any number of wounds) []  - 0 Wound Tracing (instead of photographs) []  - 0 Simple Wound Measurement - one wound X- 2 5 Complex Wound Measurement - multiple wounds INTERVENTIONS - Wound Dressings X - Small Wound Dressing one or multiple wounds 2 10 []  - 0 Medium Wound Dressing one or multiple wounds []  - 0 Large Wound Dressing one or multiple wounds []  - 0 Application of Medications - topical []  - 0 Application of Medications - injection INTERVENTIONS - Miscellaneous []  - External ear exam 0 []  - 0 Specimen Collection (cultures, biopsies, blood, body fluids, etc.) []  - 0 Specimen(s) / Culture(s) sent or taken to Lab for analysis []  - 0 Patient Transfer (multiple staff / Nurse, adult / Similar devices) []  - 0 Simple Staple / Suture removal (25 or less) []  - 0 Complex Staple / Suture removal (26 or more) []  - 0 Hypo / Hyperglycemic Management (close monitor of Blood Glucose) []  - 0 Ankle / Brachial Index (ABI) - do not check if billed separately X- 1 5 Vital Signs [name], [name] ([id number]) Has the patient been seen at the hospital within the last three years: Yes Total Score: 100 Level Of Care: New/Established - Level 3 Electronic Signature(s) Signed: 07/13/2018 5:26:03 PM By: Curtis Sites Entered By: Curtis Sites on 07/13/2018 10:59:13 [name] ([id number]) -------------------------------------------------------------------------------- Encounter Discharge Information Details Patient Name: [name] Date of Service: 07/13/2018 10:00 AM Medical Record Number: [id number] Patient Account Number: [id number] Date of Birth/Sex: 16-Jan-1923 (84 y.o. F) Treating RN: Curtis Sites Primary Care Marqui Formby: Vonita Moss Other Clinician: Referring Jourdin Gens: Vonita Moss Treating Luvada Salamone/Extender: Linwood Dibbles, HOYT Weeks in Treatment: 70 Encounter Discharge Information Items Discharge Condition: Stable Ambulatory Status: Wheelchair Discharge Destination: Home Transportation: Private Auto Accompanied By: daughter and caregiver Schedule Follow-up Appointment: Yes Clinical Summary of Care: Electronic Signature(s) Signed: 07/13/2018 5:26:03 PM By: Curtis Sites Entered By: Curtis Sites on 07/13/2018 11:00:52 [name] ([id number]) -------------------------------------------------------------------------------- Lower Extremity Assessment Details Patient Name: [name] Date of Service: 07/13/2018 10:00 AM Medical Record Number: [id number] Patient Account Number: [id number] Date of Birth/Sex: 08-Jan-1923 (84 y.o. F) Treating RN: Rema Jasmine Primary Care Jeaninne Lodico: Vonita Moss Other Clinician: Referring Zymiere Trostle: Vonita Moss Treating [place] Basham/Extender: Linwood Dibbles, HOYT Weeks in Treatment: 6 Electronic Signature(s) Signed: 07/13/2018 3:28:14 PM By: Rema Jasmine Entered By: Rema Jasmine on 07/13/2018 10:20:21 [name] ([id number]) -------------------------------------------------------------------------------- Multi Wound Chart Details Patient Name: [name] Date of Service: 07/13/2018 10:00 AM Medical Record Number: [id number] Patient Account Number: [id number] Date of Birth/Sex: 1923-02-13 (84 y.o. F) Treating RN: Curtis Sites Primary Care Zeina Akkerman: Vonita Moss Other Clinician: Referring Azaleah Usman: Vonita Moss Treating Pecolia Marando/Extender: Linwood Dibbles, HOYT Weeks in Treatment: 78 Vital Signs Height(in): 60 Pulse(bpm): 60 Weight(lbs): 90 Blood Pressure(mmHg): 139/51 Body Mass Index(BMI): 18 Temperature(F): 97.8 Respiratory Rate 14 (breaths/min): Photos: [1:No Photos] [9:No Photos] [N/A:N/A] Wound Location:  [1:Coccyx - Midline] [9:Left Elbow] [N/A:N/A] Wounding Event: [1:Pressure Injury] [9:Trauma] [N/A:N/A] Primary Etiology: [1:Pressure Ulcer] [9:Trauma, Other] [N/A:N/A] Comorbid History: [1:Anemia, Hypertension, Dementia] [9:Anemia, Hypertension, Dementia] [N/A:N/A] Date Acquired: [1:12/01/2016] [9:02/16/2018] [N/A:N/A] Weeks of Treatment: [1:78] [9:20] [N/A:N/A] Wound Status: [1:Open] [9:Open] [N/A:N/A] Measurements L x W x D [1:3.4x1.5x0.3] [9:0.4x0.4x0.1] [N/A:N/A] (cm) Area (cm) : [1:4.006] [9:0.126] [N/A:N/A] Volume (cm) : [1:1.202] [9:0.013] [N/A:N/A] % Reduction in Area: [1:3.60%] [9:89.60%] [N/A:N/A] % Reduction in Volume: [1:27.70%] [9:89.30%] [N/A:N/A] Classification: [1:Category/Stage IV] [9:Partial Thickness] [N/A:N/A] Exudate Amount: [1:Medium] [9:Small] [N/A:N/A] Exudate Type: [1:Serosanguineous] [  9:Serous] [N/A:N/A] Exudate Color: [1:red, brown] [9:amber] [N/A:N/A] Wound Margin: [1:Epibole] [9:Flat and Intact] [N/A:N/A] Granulation Amount: [1:Large (67-100%)] [9:None Present (0%)] [N/A:N/A] Granulation Quality: [1:Red, Hyper-granulation] [9:N/A] [N/A:N/A] Necrotic Amount: [1:None Present (0%)] [9:None Present (0%)] [N/A:N/A] Exposed Structures: [1:Fat Layer (Subcutaneous Tissue) Exposed: Yes Muscle: Yes Fascia: No Tendon: No Joint: No Bone: No] [9:Fascia: No Fat Layer (Subcutaneous Tissue) Exposed: No Tendon: No Muscle: No Joint: No Bone: No Limited to Skin Breakdown] [N/A:N/A] Epithelialization: [1:None] [9:Large (67-100%)] [N/A:N/A] Periwound Skin Texture: [1:Induration: Yes Rash: Yes Excoriation: No Callus: No] [9:Excoriation: No Induration: No Callus: No Crepitus: No] [N/A:N/A] Crepitus: No Rash: No Scarring: No Scarring: No Periwound Skin Moisture: Maceration: Yes Maceration: No N/A Dry/Scaly: No Dry/Scaly: No Periwound Skin Color: Atrophie Blanche: No Atrophie Blanche: No N/A Cyanosis: No Cyanosis: No Ecchymosis: No Ecchymosis: No Erythema: No Erythema:  No Hemosiderin Staining: No Hemosiderin Staining: No Mottled: No Mottled: No Pallor: No Pallor: No Rubor: No Rubor: No Temperature: No Abnormality N/A N/A Tenderness on Palpation: Yes No N/A Wound Preparation: Ulcer Cleansing: Ulcer Cleansing: N/A Rinsed/Irrigated with Saline Rinsed/Irrigated with Saline Topical Anesthetic Applied: Topical Anesthetic Applied: Other: lidocaine 4% None Treatment Notes Electronic Signature(s) Signed: 07/13/2018 5:26:03 PM By: Curtis Sites Entered By: Curtis Sites on 07/13/2018 10:52:30 [name] ([id number]) -------------------------------------------------------------------------------- Multi-Disciplinary Care Plan Details Patient Name: [name] Date of Service: 07/13/2018 10:00 AM Medical Record Number: [id number] Patient Account Number: [id number] Date of Birth/Sex: August 27, 1923 (84 y.o. F) Treating RN: Curtis Sites Primary Care Janziel Hockett: Vonita Moss Other Clinician: Referring Jailen Coward: Vonita Moss Treating Karlee Staff/Extender: Linwood Dibbles, HOYT Weeks in Treatment: 51 Active Inactive ` Abuse / Safety / Falls / Self Care Management Nursing Diagnoses: Impaired physical mobility Potential for falls Goals: Patient will remain injury free Date Initiated: 01/07/2017 Target Resolution Date: 04/03/2017 Goal Status: Active Interventions: Assess fall risk on admission and as needed Notes: ` Nutrition Nursing Diagnoses: Potential for alteratiion in Nutrition/Potential for imbalanced nutrition Goals: Patient/caregiver agrees to and verbalizes understanding of need to use nutritional supplements and/or vitamins as prescribed Date Initiated: 01/07/2017 Target Resolution Date: 04/03/2017 Goal Status: Active Interventions: Assess patient nutrition upon admission and as needed per policy Notes: ` Orientation to the Wound Care Program Nursing Diagnoses: Knowledge deficit related to the wound healing center  program Goals: Patient/caregiver will verbalize understanding of the Wound Healing Center Program Date Initiated: 01/07/2017 Target Resolution Date: 04/03/2017 Goal Status: Active ANAILY, [name] ([id number]) Interventions: Provide education on orientation to the wound center Notes: ` Pressure Nursing Diagnoses: Knowledge deficit related to causes and risk factors for pressure ulcer development Goals: Patient will remain free from development of additional pressure ulcers Date Initiated: 01/07/2017 Target Resolution Date: 04/03/2017 Goal Status: Active Interventions: Assess potential for pressure ulcer upon admission and as needed Notes: ` Wound/Skin Impairment Nursing Diagnoses: Impaired tissue integrity Goals: Patient/caregiver will verbalize understanding of skin care regimen Date Initiated: 01/07/2017 Target Resolution Date: 04/03/2017 Goal Status: Active Ulcer/skin breakdown will have a volume reduction of 30% by week 4 Date Initiated: 01/07/2017 Target Resolution Date: 04/03/2017 Goal Status: Active Ulcer/skin breakdown will have a volume reduction of 50% by week 8 Date Initiated: 01/07/2017 Target Resolution Date: 04/03/2017 Goal Status: Active Ulcer/skin breakdown will have a volume reduction of 80% by week 12 Date Initiated: 01/07/2017 Target Resolution Date: 04/03/2017 Goal Status: Active Ulcer/skin breakdown will heal within 14 weeks Date Initiated: 01/07/2017 Target Resolution Date: 04/03/2017 Goal Status: Active Interventions: Assess patient/caregiver ability to obtain necessary supplies Assess patient/caregiver ability to perform ulcer/skin care regimen  upon admission and as needed Assess ulceration(s) every visit Notes: Electronic Signature(s) [name], [name] ([id number]) Signed: 07/13/2018 5:26:03 PM By: Curtis Sites Entered By: Curtis Sites on 07/13/2018 10:52:22 [name]  ([id number]) -------------------------------------------------------------------------------- Pain Assessment Details Patient Name: [name] Date of Service: 07/13/2018 10:00 AM Medical Record Number: [id number] Patient Account Number: [id number] Date of Birth/Sex: May 05, 1923 (84 y.o. F) Treating RN: Rema Jasmine Primary Care Liana Camerer: Vonita Moss Other Clinician: Referring Jentry Warnell: Vonita Moss Treating Bethannie Iglehart/Extender: Linwood Dibbles, HOYT Weeks in Treatment: 43 Active Problems Location of Pain Severity and Description of Pain Patient Has Paino No Site Locations Pain Management and Medication Current Pain Management: Electronic Signature(s) Signed: 07/13/2018 3:28:14 PM By: Rema Jasmine Signed: 07/13/2018 5:26:03 PM By: Curtis Sites Entered By: Curtis Sites on 07/13/2018 10:52:44 [name] ([id number]) -------------------------------------------------------------------------------- Patient/Caregiver Education Details Patient Name: [name] Date of Service: 07/13/2018 10:00 AM Medical Record Number: [id number] Patient Account Number: [id number] Date of Birth/Gender: 03-24-1923 (84 y.o. F) Treating RN: Curtis Sites Primary Care Physician: Vonita Moss Other Clinician: Referring Physician: Vonita Moss Treating Physician/Extender: Skeet Simmer in Treatment: 3 Education Assessment Education Provided To: Caregiver Education Topics Provided Wound/Skin Impairment: Handouts: Other: wound care as ordered Methods: Demonstration, Explain/Verbal Responses: State content correctly Electronic Signature(s) Signed: 07/13/2018 5:26:03 PM By: Curtis Sites Entered By: Curtis Sites on 07/13/2018 10:59:36 [name] ([id number]) -------------------------------------------------------------------------------- Wound Assessment Details Patient Name: [name] Date of Service: 07/13/2018 10:00 AM Medical Record Number:  [id number] Patient Account Number: [id number] Date of Birth/Sex: [birth year] (84 y.o. F) Treating RN: Rema Jasmine Primary Care Arthur Speagle: Vonita Moss Other Clinician: Referring Shenita Trego: Vonita Moss Treating Shauntavia Brackin/Extender: Linwood Dibbles, HOYT Weeks in Treatment: 78 Wound Status Wound Number: 1 Primary Etiology: Pressure Ulcer Wound Location: Coccyx - Midline Wound Status: Open Wounding Event: Pressure Injury Comorbid History: Anemia, Hypertension, Dementia Date Acquired: 12/01/2016 Weeks Of Treatment: 78 Clustered Wound: No Photos Photo Uploaded By: Rema Jasmine on 07/13/2018 11:28:25 Wound Measurements Length: (cm) 3.4 % Reductio Width: (cm) 1.5 % Reductio Depth: (cm) 0.3 Epithelial Area: (cm) 4.006 Tunneling Volume: (cm) 1.202 Undermini Startin Ending Maximum n in Area: 3.6% n in Volume: 27.7% ization: None : No ng: Yes g Position (o'clock): 6 Position (o'clock): 8 Distance: (cm) 1.7 Wound Description Classification: Category/Stage IV Foul Odor Wound Margin: Epibole Slough/Fib Exudate Amount: Medium Exudate Type: Serosanguineous Exudate Color: red, brown After Cleansing: No rino Yes Wound Bed Granulation Amount: Large (67-100%) Exposed Structure Granulation Quality: Red, Hyper-granulation Fascia Exposed: No Necrotic Amount: None Present (0%) Fat Layer (Subcutaneous Tissue) Exposed: Yes Tendon Exposed: No Muscle Exposed: Yes [name], WESTLEY. ([id number]) Necrosis of Muscle: No Joint Exposed: No Bone Exposed: No Periwound Skin Texture Texture Color No Abnormalities Noted: No No Abnormalities Noted: No Callus: No Atrophie Blanche: No Crepitus: No Cyanosis: No Excoriation: No Ecchymosis: No Induration: Yes Erythema: No Rash: Yes Hemosiderin Staining: No Scarring: No Mottled: No Pallor: No Moisture Rubor: No No Abnormalities Noted: No Dry / Scaly: No Temperature / Pain Maceration: Yes Temperature: No Abnormality Tenderness on Palpation:  Yes Wound Preparation Ulcer Cleansing: Rinsed/Irrigated with Saline Topical Anesthetic Applied: Other: lidocaine 4%, Treatment Notes Wound #1 (Midline Coccyx) 1. Cleansed with: Clean wound with Normal Saline 2. Anesthetic Topical Lidocaine 4% cream to wound bed prior to debridement 3. Peri-wound Care: Skin Prep 4. Dressing Applied: Calcium Alginate with Silver Prisma Ag 5. Secondary Dressing Applied Bordered Foam Dressing Notes Prisma, silver cell, and BFD Electronic Signature(s) Signed: 07/13/2018 3:28:14 PM By: Rema Jasmine  Signed: 07/13/2018 5:26:03 PM By: Curtis Sites Entered By: Curtis Sites on 07/13/2018 10:56:53 [name] ([id number]) -------------------------------------------------------------------------------- Wound Assessment Details Patient Name: [name] Date of Service: 07/13/2018 10:00 AM Medical Record Number: [id number] Patient Account Number: [id number] Date of Birth/Sex: [birth year] (84 y.o. F) Treating RN: Rema Jasmine Primary Care Courtnie Brenes: Vonita Moss Other Clinician: Referring Kaylianna Detert: Vonita Moss Treating Olita Takeshita/Extender: Linwood Dibbles, HOYT Weeks in Treatment: 78 Wound Status Wound Number: 9 Primary Etiology: Trauma, Other Wound Location: Left Elbow Wound Status: Open Wounding Event: Trauma Comorbid History: Anemia, Hypertension, Dementia Date Acquired: 02/16/2018 Weeks Of Treatment: 20 Clustered Wound: No Photos Photo Uploaded By: Rema Jasmine on 07/13/2018 11:28:26 Wound Measurements Length: (cm) 0.4 Width: (cm) 0.4 Depth: (cm) 0.1 Area: (cm) 0.126 Volume: (cm) 0.013 % Reduction in Area: 89.6% % Reduction in Volume: 89.3% Epithelialization: Large (67-100%) Tunneling: No Undermining: No Wound Description Classification: Partial Thickness Foul Odor Wound Margin: Flat and Intact Slough/Fib Exudate Amount: Small Exudate Type: Serous Exudate Color: amber After Cleansing: No rino No Wound Bed Granulation Amount:  None Present (0%) Exposed Structure Necrotic Amount: None Present (0%) Fascia Exposed: No Fat Layer (Subcutaneous Tissue) Exposed: No Tendon Exposed: No Muscle Exposed: No Joint Exposed: No Bone Exposed: No Limited to Skin Breakdown Periwound Skin Texture [name], BROUGHAM. ([id number]) Texture Color No Abnormalities Noted: No No Abnormalities Noted: No Callus: No Atrophie Blanche: No Crepitus: No Cyanosis: No Excoriation: No Ecchymosis: No Induration: No Erythema: No Rash: No Hemosiderin Staining: No Scarring: No Mottled: No Pallor: No Moisture Rubor: No No Abnormalities Noted: No Dry / Scaly: No Maceration: No Wound Preparation Ulcer Cleansing: Rinsed/Irrigated with Saline Topical Anesthetic Applied: None Treatment Notes Wound #9 (Left Elbow) 1. Cleansed with: Clean wound with Normal Saline 2. Anesthetic Topical Lidocaine 4% cream to wound bed prior to debridement 4. Dressing Applied: Hydrafera Blue 5. Secondary Dressing Applied Dry Gauze 7. Secured with Tape Notes conform and stretch net Electronic Signature(s) Signed: 07/13/2018 3:28:14 PM By: Rema Jasmine Entered By: Rema Jasmine on 07/13/2018 10:36:00 [name] ([id number]) -------------------------------------------------------------------------------- Vitals Details Patient Name: [name] Date of Service: 07/13/2018 10:00 AM Medical Record Number: [id number] Patient Account Number: [id number] Date of Birth/Sex: May 31, 1923 (84 y.o. F) Treating RN: Rema Jasmine Primary Care Herndon Grill: Vonita Moss Other Clinician: Referring Aunesti Pellegrino: Vonita Moss Treating Damaria Stofko/Extender: Linwood Dibbles, HOYT Weeks in Treatment: 79 Vital Signs Time Taken: 10:12 Temperature (F): 97.8 Height (in): 60 Pulse (bpm): 60 Weight (lbs): 90 Respiratory Rate (breaths/min): 14 Body Mass Index (BMI): 17.6 Blood Pressure (mmHg): 139/51 Reference Range: 80 - 120 mg / dl Electronic Signature(s) Signed: 07/13/2018  5:26:03 PM By: Curtis Sites Entered By: Curtis Sites on 07/13/2018 10:52:51

## 2018-07-16 NOTE — Progress Notes (Addendum)
[name], [name] ([id number]) Visit Report for 07/13/2018 Chief Complaint Document Details Patient Name: [name], [name] Date of Service: 07/13/2018 10:00 AM Medical Record Number: [id number] Patient Account Number: [id number] Date of Birth/Sex: 03/03/1923 (84 y.o. F) Treating RN: Curtis Sites Primary Care Provider: Vonita Moss Other Clinician: Referring Provider: Vonita Moss Treating Provider/Extender: Linwood Dibbles, HOYT Weeks in Treatment: 28 Information Obtained from: Patient Chief Complaint multiple wounds/ulcers Electronic Signature(s) Signed: 07/14/2018 11:13:36 AM By: Lenda Kelp PA-C Entered By: Lenda Kelp on 07/13/2018 11:12:20 [name] ([id number]) -------------------------------------------------------------------------------- HPI Details Patient Name: [name] Date of Service: 07/13/2018 10:00 AM Medical Record Number: [id number] Patient Account Number: [id number] Date of Birth/Sex: March 29, 1923 (84 y.o. F) Treating RN: Curtis Sites Primary Care Provider: Vonita Moss Other Clinician: Referring Provider: Vonita Moss Treating Provider/Extender: Linwood Dibbles, HOYT Weeks in Treatment: 38 History of Present Illness HPI Description: 01/07/17 this is a 84 year old woman admitted to the clinic today for review of a pressure ulcer on her lower sacrum. She is referred from her primary physician's office after being seen on 3/22 with a 3 cm pressure area. Her daughter and caretaker accompanied her today state that the area first became obvious about a month ago and his since deteriorated. They have recently got [name] home health involved and have been using [name] to the wound. They have ordered a pressure relief surface for her mattress. They are turning her religiously. They state that she eats well and they've been forcing fluids on her. She is on a multivitamin. Looking through [facility] point last albumin I see was 4.4 on 10/28. The  patient has advanced parkinsonism which looks superficially like advanced Parkinson's disease although her daughter tells me she did not ever respond to Sinemet therefore this may have another pathology with signs of parkinsonism. However I think this is largely a mute point currently. She also has dementia and is nonambulatory. Since this started they have been keeping her in bed and turning her religiously every 2 hours. She lives at home in [place] with her husband with 24/7 care giving 01/13/17 santyl change qd. Still will require further debridement. continue santyl. 01/20/17; patient's wound actually looks some better less adherent necrotic surface. There is actually visible granulation. We're using [name] 01/27/17; better-looking surface but still a lot of necrotic tissue on the base of this wound. The periwound erythema is better than last week we are still using [name]. Her daughter tells [place] that she is still having trouble with the pressure-relief mattress through medical modalities 02/03/18; I had the patient scheduled for a two week followup however her daughter brought her in early concerned for discoloration on 2 areas of the wound circumference. We have bee using santyl 02/10/17;Better looking surface to the wound. Rim appears better suggesting better offloading. Using santyl 02/24/17; change to Silver Collegen last time. Wound appears better. 03/10/17; still using silver collagen religious offloading. Intake is satisfactory per her daughter. Dimension slightly better 03/12/2017 -- Dr. Jannetta Quint patient who had been seen 2 days ago and was doing fairly well. The patient is brought in by her daughter who noticed a new wound just above the previous wound on her sacral area and going on more to the left lateral side. She was very concerned and we asked her to get in for an opinion. 03/17/17; above is noted. The patient has developed a progressive area to the left of her original wound. This  seems to this started with a ring of red skin  with a more pale interior almost looking fungal. There was a rim of blister through part of the area although this did not look like zoster. They have been applying triamcinolone that was prescribed last week by [name] and the area has a fold to a linear band area which is confluent, erythematous and with obvious epidermal swelling but there is no overt tenderness or crepitus. She has lost some surface epithelium closer to the wound surface itself and now has a more superficial wound in this area and the extending erythema goes towards the left buttock. This is well demarcated between involved in normal skin but once again does not appear to be at all tender. If there is a contact issue here I cannot get the history out of the daughter or the caregiver that are with her. 03/24/17; the patient arrives today with the wound slightly worse slightly more drainage. The bandlike area of erythema that I treated as a possible pineal infection has improved somewhat although proximally is still has confluent erythema without overt tenderness. We have been using silver alginate since the most recent deterioration. To the bandlike degree of erythema we have been using Lotrisone cream 03/31/17; patient arrives today with the wound slightly larger, necrotic surface and surrounding erythema. The bandlike area of erythema that I treated as a possible pineal infection is less swollen but still present I've been using Lotrisone cream on that largely related to the presence of a tinea looking infection when this was first seen. We've been using silver alginate. X-ray that I ordered last week showed no acute bony abnormalities mild fecal impaction. Lab work showed a comprehensive metabolic panel that was normal including an albumin of 3.8. White count was 9.5 hemoglobin 12.3 differential count normal. C-reactive protein was less than 1 and sedimentation rate and 17. The  latter 2 values does not support an ongoing bacterial infection. 04/14/17; patient arrives after a 2 week hiatus. Her wound is not in good condition. Although the base of the wound looks [name], [name] ([id number]) stable she still has an erythematous area that was apparently blistered over the weekend. This again points to the left. As our intake nurse pointed out today this is in the area where the tissue folds together and we may need to prevent try to prevent this. Lab work and x-ray that I did to 3 weeks ago were unremarkable including her albumin nevertheless she is an extremely frail condition physically. We have have been using [name] 04/22/17; I changed her to silver alginate because of the surrounding maceration and moisture last week. The daughter did not like the way the wound looked in the middle of the week and changed her back to MelletteSantyl. They're putting gauze on top of this. Thinks still using Lotrisone. 05/06/17 on evaluation today patient sacral wound appears to be doing okay and does not seem to be any worse. She is having no significant pain during evaluation today the secondary to mental status she is unable to rate or describe whether she had any pain she was not however flinching. Her daughter states that the wound does appear to be looking better to her. Still we are having difficulty with the skinfold that seems to be closing in on itself at this point. All in all I feel like she is making some good progress in the [name] seems to be the official for her. They do tell me that a refill if we are gonna continue that today. No fevers, chills, nausea,  or vomiting noted at this time. 05/13/17 presents today for evaluation concerning her ongoing sacral pressure ulcer. Unfortunately she also has an area of deep tissue injury in the right Ischial region which is starting to show up. The sacral wound also continues to show signs of necrotic tissue overlying and has declined. Overall we  really have not seen a significant improvement in the past several months in regard to the sacral wound and now patient is starting to develop a new wound in the right Ischial region. Obviously this is not trending in the direction that we want to see. No fevers, chills, nausea, or vomiting noted at this time. 05/19/17; I have not seen this wound and almost a month however there is nothing really positive to say about it. Necrotic tissue over the surface which superiorly I think abuts on her sacrum. She has surrounding erythema. I would be surprised if there is not underlying osteomyelitis or soft tissue infection. 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 She apparently eats well per description although I wonder about this looking at her. Lab work I did probably 4 weeks ago however was really quite normal including a serum albumin 05/26/17; culture I did last week grew Escherichia coli and methicillin sensitive staph aureus which should've been well covered by the Augmentin and ciprofloxacin. Indeed the erythema around the wound in the bed of the wound looks somewhat better. Her intake is still satisfactory. They now have a near fluidized bed 06/02/17; they completed the antibiotics last Friday. Using collagen. Daughter still reports eating and drinking well. There is less visualized erythema around the wound. 06/16/17; large pressure ulcer over her lower sacrum and coccyx. Using [name] to the wound bed. 06/30/17; certainly no change in dimensions of this large stage III wound over her sacrum and coccyx. They've been using [name]. There is no exposed bone. She has a candidal/tinea area in the right inguinal area. Other than that her daughter relates that she is eating and drinking well there changing her positioning to make sure the areas offloaded 07/14/17; no major change in the dimensions of this large stage III wound. Initially a smaller wound that became secondarily infected causing  significant tissue breakdown although it is been stable in the last several weeks. Tunneling superiorly at roughly 1:00 no change here either. There is no bone palpable. Both the patient's daughter and caretaker states that she eats well. I have not rechecked her blood work 07/27/17; patient arrives in clinic today and generally a deteriorated looking state. Mild fever with axillary temperature of 100.4. Daughter reports she has not been eating and drinking well since yesterday. She looks more pale and thin and less responsive. We have been using silver collagen to her wound 08/11/17; since the last time the patient was here things have gone better. Her fever went down and she started eating and drinking again. Culture I did of the wound showed methicillin sensitive staph aureus, Morganella and enterococcus. I only treated her with Keflex which would've not covered the Morganella and enterococcus however the purulent area on the 2:00 side of her wound is a lot better and the bandlike erythema that concern me also was resolved. I'd called the daughter last week to confirm that she was a lot better. She finished the Keflex last Thursday she also suffered a skin tear this morning perhaps while putting on her incontinence brief period is on the lateral aspect of her right leg. Clean wound with the surface epithelium not  viable. 08/25/17; last week the patient was noted to have erythema around the wound margin and a slight fever which the patient's daughter says was [id number]. Our office was contacted by home health however we did not have a space to work the patient in that she went to see her primary physician Dr. Maurice MarchLane. She was not febrile during this visit on 08/21/17 there was erythema around the wound similar to last occasion. Dr. Maurice MarchLane in reference to my culture from 07/28/17 and put her on doxycycline capsules which they're opening twice a day for 10 days. 09/17/17; patient arrives today with the wound  bed looking fairly well granulated. There is undermining from 4 to 6:00 although this seems to of contracted slightly. She does not have obvious infection although the daughter states there was some darkening of the wound circumference that is not evident today. They state she is eating well. They are concerned about oral thrush 09/30/16; very fibrin looking granulated wound bed. Her undermining from 4 to 6:00 is about the same but also appears to be well granulated. She has rolled edges of senescent tissue from roughly 7 to 12:00. There is no evidence of infection [name], [name] ([id number]) 10/13/17 on evaluation today patient appears to be doing fairly well all things considered in regard to her sacral wound. There's really not a lot of significant change or improvement she does have some evidence of contusion and deep tissue injury around the left border of the wound that patient's daughter did inquire about today. Nonetheless overall the wound appears to be doing about the same in my opinion. There is no significant indication of infection there also is no significant slough noted at this point. 10/27/17 She is here in follow up evaluation of a sacral ulcer. She is accompanied by her daughter and caregiver. There is red granulation tissue throughout, persistent discoloration to left border; this appears consistent with deep tissue injury. There are multiple areas covered in foam borders, tegaderm, etc that are "preventative" with "no wounds". We will continue with prisma and continue with two week follow ups 11/17/17 on evaluation today patient appears to be doing decently well in regard to her wound at this point. She continues to have a sacral wound ulcer. We see her roughly every two weeks. In the last week she was actually in the hospital due to what was diagnosed as sepsis although no organisms were ever identified in the actual blood cultures. The hospital really was not sure that the  wound was the cause of the infection but they really did not figure out anything else that would be a causative organism she did have a CT scan and that revealed no evidence of pneumonia there was also no evidence of urinary tract infection. Again it very well could have been the wound called in those although wound appears to be doing fairly well at this point. 12/15/17 on evaluation today patient actually appears to be doing about the same in regard to her sacral ulcer. The one thing different is that she does seem to have a rash where her right arm is contracted and being held to the thorax. The area underlying both on the ventral side of the arm as well is the thorax where it comes in contact shows evidence of a rash which appears to be fungal in nature. There does not appear to be any evidence of infection lies at this point. There does not appear to be a rash consistent with shingles which was  also of concern initially. No fevers, chills, nausea, or vomiting noted at this time. 12/29/17 on evaluation today patient appears to be doing a little better in regard to her sacral wound although the one changes the 12 o'clock location of the wound seems to have attached at one point which no longer allows this to pull back. This has caused an area of undermining that seems to be attaching as well in the 12 o'clock location. I do think that this is something that can be managed and is not necessarily a bad thing. Nonetheless she does seem to have some pain with exploration of this region of undermining. She continues to have an area under her right arm on the chest wall of erythema although this is a little better especially after the home health nurse is actually got in order for Diflucan times one for the patient. She is a new area on the left wrist that looks like because of the contraction her nail may have pushed in on her wrist area causing a slight cut which subsequently became infected. She has  some honey crusted drainage noted. 03/09/18 undervaluation today patient appears to be doing fairly well in regard to her wounds in general. She has been tolerating the dressing changes without complication. Overall I'm pleased with the progress that seems to be made week by week especially in regard to the sacral ulcer. Her daughter and the caregiver seem to take very good care of her. With that being said she has very little undermining in regard to the sacral wound in good granulation she also has good epithelialization noted. 03/23/18 on evaluation today patient appears to be doing rather well in regard to her sacral wound. Unfortunately she does have a little bit of necrotic tissue noted in the central portion of her wound on the left elbow. This is due to the fact that she is lying on this arm seeing how it is contracted. Currently her daughter has started to avoid lying on the side it all due to the fact that the necrotic tissue was noted. With that being said they have been taking very good care of her in my pinion. Fortunately there does not appear to be any evidence of infection which is good news. Overall I'm pleased with the progress she's made other than in regard to the elbow. 04/06/18 on evaluation today patient actually appears to be doing okay in regard to the sacral wound. In fact it appears to be somewhat smaller in general although there does appear to be more undermining at the 6 o'clock location than was previously noted. She has been tolerating the dressing changes without complication in general which is also good news. Nonetheless she does have a new small skin tear/opening on her leg which was evaluated today. Fortunately this appears to be minimal and the Xeroform gauze which has been used up to this point in the past has been utilized very effectively seems to be doing well in that regard. Her left elbow ulcer seems to also be doing excellent at this point making good  progress. 04/20/18 on evaluation today patient appears to be doing fairly well in regard to her sacral wound. This definitely seems to be better than during last evaluation where it was indeed infected. With that being said she has been tolerating the dressing changes as best it can be expected. Her elbow seems to be drying out and a lot of times the dressing is getting stuck according to her caregiver. [name],  [name] ([id number]) 05/04/18-She is seen in follow-up evaluation for a sacral and left elbow pressure ulcer. These are stable/improved and we will continue with same treatment plan and she will follow-up in 2 weeks 05/18/18 on evaluation today patient actually appears to be doing better in regard to her left elbow ulcer. The sacral wound is also shown signs of improvement which is good news. With that being said she does have a new right medial lower extremity ulcer which actually appears to show some signs of cellulitis/infection. She is previously taken Augmentin with good result. Nonetheless the patient really does not have anything that I can culture at the site there's actually some good epithelialization noted although again there is some cellulitis appearance as well. 06/01/18 on evaluation today patient appears to actually be doing very well in general in regard to her left elbow wound which is smaller in her lower extremity ulcer which is actually healed. With that being said the sacral wound in particular show signs of improvement although she still has undermining in the six-7 o'clock location. No fevers, chills, nausea, or vomiting noted at this time. 06/15/18 on evaluation today patient actually appears to be doing excellent in regard to her elbow ulcer on the left elbow. This is shown signs of great improvement and I do feel like she is very close to healing in this regard. In general her sacral wound also appears to be doing well there's good sign of improvement there as well as  far as the overall surface of the wound is concerned. She does have a significant area of tunneling at the 6 o'clock location that still is about the same really there's no significant improvement in that regard. Nonetheless we are attempting to try and treat this by way of packing with Prisma followed by silver so to help control moisture. The patient been tolerating the dressing changes without complication. 06/29/18 on evaluation today patient actually appears to be doing very well in regard to her left elbow ulcer and her sacral ulcer. She has been tolerating the dressing changes without complication which is good news. In fact the sacral wound appears to have almost completely closed although she does have an area of undermining/tunneling at roughly the 7 o'clock location that is still unfortunately having a more difficult time closing although as opposed to during the last visit I was unable to get a normal Q-tip down into the undermined region. Actually had to use a skinny probe or else the backside of the Q-tip were just a wooden stake was. I think this is a good sign that this area seems to be filling in which is wonderful. Overall I'm pleased with how things are progressing. 07/13/18 on evaluation today patient appears to be doing a little worse in regard to the overall appearance of the sacral wound. She has been tolerating the dressing changes without complication. With that being said the patient's daughter tells me that the home health nurse actually dug around it significantly in the 12 o'clock location of the wound and subsequently reopen this area which was very well healed previous. This was a new nurse that hasn't generally been coming out to see the patient. Nonetheless they are very upset about this and the fact that the wound was actually worse when she was done "taking". It does appear that the wound is recovering and in fact since that point has closed quite significantly  compared to what they tell me it looked like when she was done.  Electronic Signature(s) Signed: 07/15/2018 5:27:38 PM By: Lenda Kelp PA-C Entered By: Lenda Kelp on 07/15/2018 17:11:09 [name] ([id number]) -------------------------------------------------------------------------------- Physical Exam Details Patient Name: [name] Date of Service: 07/13/2018 10:00 AM Medical Record Number: [id number] Patient Account Number: [id number] Date of Birth/Sex: 1922/10/22 (84 y.o. F) Treating RN: Curtis Sites Primary Care Provider: Vonita Moss Other Clinician: Referring Provider: Vonita Moss Treating Provider/Extender: STONE III, HOYT Weeks in Treatment: 66 Constitutional Well-nourished and well-hydrated in no acute distress. Respiratory normal breathing without difficulty. clear to auscultation bilaterally. Cardiovascular regular rate and rhythm with normal S1, S2. Psychiatric this patient is able to make decisions and demonstrates good insight into disease process. Alert and Oriented x 3. pleasant and cooperative. Notes Patient's wound bed currently shows evidence of improvement in regard to the undermining at the six to 8 o'clock location. It also appears that the area at the 12 o'clock location where there was a reopening has actually still down and close there's just a superficial area which is actually not connected to the original wound but more proximal that is showing signs of healing but is superficially open at this point. Electronic Signature(s) Signed: 07/15/2018 5:27:38 PM By: Lenda Kelp PA-C Entered By: Lenda Kelp on 07/15/2018 17:11:37 [name] ([id number]) -------------------------------------------------------------------------------- Physician Orders Details Patient Name: [name] Date of Service: 07/13/2018 10:00 AM Medical Record Number: [id number] Patient Account Number: [id number] Date of Birth/Sex:  [birth year] (84 y.o. F) Treating RN: Curtis Sites Primary Care Provider: Vonita Moss Other Clinician: Referring Provider: Vonita Moss Treating Provider/Extender: Linwood Dibbles, HOYT Weeks in Treatment: 23 Verbal / Phone Orders: No Diagnosis Coding Wound Cleansing Wound #1 Midline Coccyx o Clean wound with Normal Saline. o May Shower, gently pat wound dry prior to applying new dressing. Wound #9 Left Elbow o Clean wound with Normal Saline. o May Shower, gently pat wound dry prior to applying new dressing. Anesthetic (add to Medication List) Wound #1 Midline Coccyx o Topical Lidocaine 4% cream applied to wound bed prior to debridement (In Clinic Only). Wound #9 Left Elbow o Topical Lidocaine 4% cream applied to wound bed prior to debridement (In Clinic Only). Skin Barriers/Peri-Wound Care Wound #1 Midline Coccyx o Skin Prep - use only where the allevyn sticks not on reddened areas o Antifungal powder-Nystatin - around peri-wound on coccyx (do not get in wound), under right breast and right arm, right abdomen Primary Wound Dressing Wound #1 Midline Coccyx o Silver Alginate - silvercel Ag non-adherent o Silver Collagen - prisma ag and pack into undermining as well slightly moisten with hydrogel or KY Jelly and then place silvercel Ag non-adherent on top of the silver collagen Wound #9 Left Elbow o Hydrafera Blue Ready Transfer Secondary Dressing Wound #1 Midline Coccyx o Boardered Foam Dressing - HHRN PLEASE ORDER ALLEVYN LIFE BORDERED FOAM FOR PATIENT (OFF BRANDS IRRITATE HER SKIN) Wound #9 Left Elbow o Non-adherent pad o Other - stretch netting #4 Dressing Change Frequency Wound #1 Midline Coccyx o Change dressing every day. [name], [name] ([id number]) Wound #9 Left Elbow o Change dressing every day. Follow-up Appointments o Return Appointment in 2 weeks. Off-Loading Wound #1 Midline Coccyx o Roho cushion for wheelchair o  Mattress - fluidized air mattress o Turn and reposition every 2 hours Additional Orders / Instructions Wound #1 Midline Coccyx o Increase protein intake. - please add protein supplements to patients diet o Other: - please add vitamin A, vitamin C and zinc supplements to patients diet Wound #  9 Left Elbow o Increase protein intake. - please add protein supplements to patients diet o Other: - please add vitamin A, vitamin C and zinc supplements to patients diet Home Health Wound #1 Midline Coccyx o Continue Home Health Visits - [facility] o Home Health Nurse may visit PRN to address patientos wound care needs. o FACE TO FACE ENCOUNTER: MEDICARE and MEDICAID PATIENTS: I certify that this patient is under my care and that I had a face-to-face encounter that meets the physician face-to-face encounter requirements with this patient on this date. The encounter with the patient was in whole or in part for the following MEDICAL CONDITION: (primary reason for Home Healthcare) MEDICAL NECESSITY: I certify, that based on my findings, NURSING services are a medically necessary home health service. HOME BOUND STATUS: I certify that my clinical findings support that this patient is homebound (i.e., Due to illness or injury, pt requires aid of supportive devices such as crutches, cane, wheelchairs, walkers, the use of special transportation or the assistance of another person to leave their place of residence. There is a normal inability to leave the home and doing so requires considerable and taxing effort. Other absences are for medical reasons / religious services and are infrequent or of short duration when for other reasons). o If current dressing causes regression in wound condition, may D/C ordered dressing product/s and apply Normal Saline Moist Dressing daily until next Wound Healing Center / Other MD appointment. Notify Wound Healing Center of regression in wound condition at  [phone]. o Please direct any NON-WOUND related issues/requests for orders to patient's Primary Care Physician - Dr Vonita Moss Wound #9 Left Elbow o Continue Home Health Visits - [facility] o Home Health Nurse may visit PRN to address patientos wound care needs. o FACE TO FACE ENCOUNTER: MEDICARE and MEDICAID PATIENTS: I certify that this patient is under my care and that I had a face-to-face encounter that meets the physician face-to-face encounter requirements with this patient on this date. The encounter with the patient was in whole or in part for the following MEDICAL CONDITION: (primary reason for Home Healthcare) MEDICAL NECESSITY: I certify, that based on my findings, NURSING services are a medically necessary home health service. HOME BOUND STATUS: I certify that my clinical findings support that this patient is homebound (i.e., Due to illness or injury, pt requires aid of supportive devices such as crutches, cane, wheelchairs, walkers, the use of special transportation or the assistance of another person to leave their place of residence. There is a normal inability to leave the home and doing so requires considerable and taxing effort. Other absences are for medical reasons / religious services and are infrequent or of short duration when for other reasons). [name], [name] ([id number]) o If current dressing causes regression in wound condition, may D/C ordered dressing product/s and apply Normal Saline Moist Dressing daily until next Wound Healing Center / Other MD appointment. Notify Wound Healing Center of regression in wound condition at [phone]. o Please direct any NON-WOUND related issues/requests for orders to patient's Primary Care Physician - Dr Vonita Moss Electronic Signature(s) Signed: 07/13/2018 5:26:03 PM By: Curtis Sites Signed: 07/14/2018 11:13:36 AM By: Lenda Kelp PA-C Entered By: Curtis Sites on 07/13/2018 10:58:31 [name] ([id number]) -------------------------------------------------------------------------------- Problem List Details Patient Name: [name] Date of Service: 07/13/2018 10:00 AM Medical Record Number: [id number] Patient Account Number: [id number] Date of Birth/Sex: 08/26/1923 (84 y.o. F) Treating RN: Curtis Sites Primary Care Provider: Vonita Moss  Other Clinician: Referring Provider: Vonita Moss Treating Provider/Extender: Linwood Dibbles, HOYT Weeks in Treatment: 32 Active Problems ICD-10 Evaluated Encounter Code Description Active Date Today Diagnosis L89.153 Pressure ulcer of sacral region, stage 3 01/07/2017 No Yes L89.023 Pressure ulcer of left elbow, stage 3 03/23/2018 No Yes G20 Parkinson's disease 01/07/2017 No Yes L03.312 Cellulitis of back [any part except buttock] 07/28/2017 No Yes S81.811D Laceration without foreign body, right lower leg, subsequent 08/11/2017 No Yes encounter Inactive Problems Resolved Problems Electronic Signature(s) Signed: 07/14/2018 11:13:36 AM By: Lenda Kelp PA-C Entered By: Lenda Kelp on 07/13/2018 11:12:16 [name] ([id number]) -------------------------------------------------------------------------------- Progress Note Details Patient Name: [name] Date of Service: 07/13/2018 10:00 AM Medical Record Number: [id number] Patient Account Number: [id number] Date of Birth/Sex: 11-12-1922 (84 y.o. F) Treating RN: Curtis Sites Primary Care Provider: Vonita Moss Other Clinician: Referring Provider: Vonita Moss Treating Provider/Extender: Linwood Dibbles, HOYT Weeks in Treatment: 73 Subjective Chief Complaint Information obtained from Patient multiple wounds/ulcers History of Present Illness (HPI) 01/07/17 this is a 84 year old woman admitted to the clinic today for review of a pressure ulcer on her lower sacrum. She is referred from her primary physician's office after being seen on 3/22 with a 3 cm  pressure area. Her daughter and caretaker accompanied her today state that the area first became obvious about a month ago and his since deteriorated. They have recently got [name] home health involved and have been using [name] to the wound. They have ordered a pressure relief surface for her mattress. They are turning her religiously. They state that she eats well and they've been forcing fluids on her. She is on a multivitamin. Looking through [facility] point last albumin I see was 4.4 on 10/28. The patient has advanced parkinsonism which looks superficially like advanced Parkinson's disease although her daughter tells me she did not ever respond to Sinemet therefore this may have another pathology with signs of parkinsonism. However I think this is largely a mute point currently. She also has dementia and is nonambulatory. Since this started they have been keeping her in bed and turning her religiously every 2 hours. She lives at home in [place] with her husband with 24/7 care giving 01/13/17 santyl change qd. Still will require further debridement. continue santyl. 01/20/17; patient's wound actually looks some better less adherent necrotic surface. There is actually visible granulation. We're using [name] 01/27/17; better-looking surface but still a lot of necrotic tissue on the base of this wound. The periwound erythema is better than last week we are still using [name]. Her daughter tells [place] that she is still having trouble with the pressure-relief mattress through medical modalities 02/03/18; I had the patient scheduled for a two week followup however her daughter brought her in early concerned for discoloration on 2 areas of the wound circumference. We have bee using santyl 02/10/17;Better looking surface to the wound. Rim appears better suggesting better offloading. Using santyl 02/24/17; change to Silver Collegen last time. Wound appears better. 03/10/17; still using silver collagen  religious offloading. Intake is satisfactory per her daughter. Dimension slightly better 03/12/2017 -- Dr. Jannetta Quint patient who had been seen 2 days ago and was doing fairly well. The patient is brought in by her daughter who noticed a new wound just above the previous wound on her sacral area and going on more to the left lateral side. She was very concerned and we asked her to get in for an opinion. 03/17/17; above is noted. The patient has developed  a progressive area to the left of her original wound. This seems to this started with a ring of red skin with a more pale interior almost looking fungal. There was a rim of blister through part of the area although this did not look like zoster. They have been applying triamcinolone that was prescribed last week by Dr. Meyer Russel and the area has a fold to a linear band area which is confluent, erythematous and with obvious epidermal swelling but there is no overt tenderness or crepitus. She has lost some surface epithelium closer to the wound surface itself and now has a more superficial wound in this area and the extending erythema goes towards the left buttock. This is well demarcated between involved in normal skin but once again does not appear to be at all tender. If there is a contact issue here I cannot get the history out of the daughter or the caregiver that are with her. 03/24/17; the patient arrives today with the wound slightly worse slightly more drainage. The bandlike area of erythema that I treated as a possible pineal infection has improved somewhat although proximally is still has confluent erythema without overt tenderness. We have been using silver alginate since the most recent deterioration. To the bandlike degree of erythema we have been using Lotrisone cream 03/31/17; patient arrives today with the wound slightly larger, necrotic surface and surrounding erythema. The bandlike area of erythema that I treated as a possible pineal  infection is less swollen but still present I've been using Lotrisone cream on that largely related to the presence of a tinea looking infection when this was first seen. We've been using silver alginate. [name], [name] ([id number]) X-ray that I ordered last week showed no acute bony abnormalities mild fecal impaction. Lab work showed a comprehensive metabolic panel that was normal including an albumin of 3.8. White count was 9.5 hemoglobin 12.3 differential count normal. C-reactive protein was less than 1 and sedimentation rate and 17. The latter 2 values does not support an ongoing bacterial infection. 04/14/17; patient arrives after a 2 week hiatus. Her wound is not in good condition. Although the base of the wound looks stable she still has an erythematous area that was apparently blistered over the weekend. This again points to the left. As our intake nurse pointed out today this is in the area where the tissue folds together and we may need to prevent try to prevent this. Lab work and x-ray that I did to 3 weeks ago were unremarkable including her albumin nevertheless she is an extremely frail condition physically. We have have been using [name] 04/22/17; I changed her to silver alginate because of the surrounding maceration and moisture last week. The daughter did not like the way the wound looked in the middle of the week and changed her back to [place]. They're putting gauze on top of this. Thinks still using Lotrisone. 05/06/17 on evaluation today patient sacral wound appears to be doing okay and does not seem to be any worse. She is having no significant pain during evaluation today the secondary to mental status she is unable to rate or describe whether she had any pain she was not however flinching. Her daughter states that the wound does appear to be looking better to her. Still we are having difficulty with the skinfold that seems to be closing in on itself at this point. All in all I  feel like she is making some good progress in the [name] seems to be  the official for her. They do tell me that a refill if we are gonna continue that today. No fevers, chills, nausea, or vomiting noted at this time. 05/13/17 presents today for evaluation concerning her ongoing sacral pressure ulcer. Unfortunately she also has an area of deep tissue injury in the right Ischial region which is starting to show up. The sacral wound also continues to show signs of necrotic tissue overlying and has declined. Overall we really have not seen a significant improvement in the past several months in regard to the sacral wound and now patient is starting to develop a new wound in the right Ischial region. Obviously this is not trending in the direction that we want to see. No fevers, chills, nausea, or vomiting noted at this time. 05/19/17; I have not seen this wound and almost a month however there is nothing really positive to say about it. Necrotic tissue over the surface which superiorly I think abuts on her sacrum. She has surrounding erythema. I would be surprised if there is not underlying osteomyelitis or soft tissue infection. 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 She apparently eats well per description although I wonder about this looking at her. Lab work I did probably 4 weeks ago however was really quite normal including a serum albumin 05/26/17; culture I did last week grew Escherichia coli and methicillin sensitive staph aureus which should've been well covered by the Augmentin and ciprofloxacin. Indeed the erythema around the wound in the bed of the wound looks somewhat better. Her intake is still satisfactory. They now have a near fluidized bed 06/02/17; they completed the antibiotics last Friday. Using collagen. Daughter still reports eating and drinking well. There is less visualized erythema around the wound. 06/16/17; large pressure ulcer over her lower sacrum and coccyx. Using  [name] to the wound bed. 06/30/17; certainly no change in dimensions of this large stage III wound over her sacrum and coccyx. They've been using [name]. There is no exposed bone. She has a candidal/tinea area in the right inguinal area. Other than that her daughter relates that she is eating and drinking well there changing her positioning to make sure the areas offloaded 07/14/17; no major change in the dimensions of this large stage III wound. Initially a smaller wound that became secondarily infected causing significant tissue breakdown although it is been stable in the last several weeks. Tunneling superiorly at roughly 1:00 no change here either. There is no bone palpable. Both the patient's daughter and caretaker states that she eats well. I have not rechecked her blood work 07/27/17; patient arrives in clinic today and generally a deteriorated looking state. Mild fever with axillary temperature of 100.4. Daughter reports she has not been eating and drinking well since yesterday. She looks more pale and thin and less responsive. We have been using silver collagen to her wound 08/11/17; since the last time the patient was here things have gone better. Her fever went down and she started eating and drinking again. Culture I did of the wound showed methicillin sensitive staph aureus, Morganella and enterococcus. I only treated her with Keflex which would've not covered the Morganella and enterococcus however the purulent area on the 2:00 side of her wound is a lot better and the bandlike erythema that concern me also was resolved. I'd called the daughter last week to confirm that she was a lot better. She finished the Keflex last Thursday she also suffered a skin tear this morning perhaps while putting  on her incontinence brief period is on the lateral aspect of her right leg. Clean wound with the surface epithelium not viable. 08/25/17; last week the patient was noted to have erythema around  the wound margin and a slight fever which the patient's daughter says was 79. Our office was contacted by home health however we did not have a space to work the patient in that she went to see her primary physician Dr. Maurice March. She was not febrile during this visit on 08/21/17 there was erythema around the wound similar to last occasion. Dr. Maurice March in reference to my culture from 07/28/17 and put her on doxycycline capsules which they're opening twice a day for 10 days. [name], [name] ([id number]) 09/17/17; patient arrives today with the wound bed looking fairly well granulated. There is undermining from 4 to 6:00 although this seems to of contracted slightly. She does not have obvious infection although the daughter states there was some darkening of the wound circumference that is not evident today. They state she is eating well. They are concerned about oral thrush 09/30/16; very fibrin looking granulated wound bed. Her undermining from 4 to 6:00 is about the same but also appears to be well granulated. She has rolled edges of senescent tissue from roughly 7 to 12:00. There is no evidence of infection 10/13/17 on evaluation today patient appears to be doing fairly well all things considered in regard to her sacral wound. There's really not a lot of significant change or improvement she does have some evidence of contusion and deep tissue injury around the left border of the wound that patient's daughter did inquire about today. Nonetheless overall the wound appears to be doing about the same in my opinion. There is no significant indication of infection there also is no significant slough noted at this point. 10/27/17 She is here in follow up evaluation of a sacral ulcer. She is accompanied by her daughter and caregiver. There is red granulation tissue throughout, persistent discoloration to left border; this appears consistent with deep tissue injury. There are multiple areas covered in foam  borders, tegaderm, etc that are "preventative" with "no wounds". We will continue with prisma and continue with two week follow ups 11/17/17 on evaluation today patient appears to be doing decently well in regard to her wound at this point. She continues to have a sacral wound ulcer. We see her roughly every two weeks. In the last week she was actually in the hospital due to what was diagnosed as sepsis although no organisms were ever identified in the actual blood cultures. The hospital really was not sure that the wound was the cause of the infection but they really did not figure out anything else that would be a causative organism she did have a CT scan and that revealed no evidence of pneumonia there was also no evidence of urinary tract infection. Again it very well could have been the wound called in those although wound appears to be doing fairly well at this point. 12/15/17 on evaluation today patient actually appears to be doing about the same in regard to her sacral ulcer. The one thing different is that she does seem to have a rash where her right arm is contracted and being held to the thorax. The area underlying both on the ventral side of the arm as well is the thorax where it comes in contact shows evidence of a rash which appears to be fungal in nature. There does not appear to be  any evidence of infection lies at this point. There does not appear to be a rash consistent with shingles which was also of concern initially. No fevers, chills, nausea, or vomiting noted at this time. 12/29/17 on evaluation today patient appears to be doing a little better in regard to her sacral wound although the one changes the 12 o'clock location of the wound seems to have attached at one point which no longer allows this to pull back. This has caused an area of undermining that seems to be attaching as well in the 12 o'clock location. I do think that this is something that can be managed and is not  necessarily a bad thing. Nonetheless she does seem to have some pain with exploration of this region of undermining. She continues to have an area under her right arm on the chest wall of erythema although this is a little better especially after the home health nurse is actually got in order for Diflucan times one for the patient. She is a new area on the left wrist that looks like because of the contraction her nail may have pushed in on her wrist area causing a slight cut which subsequently became infected. She has some honey crusted drainage noted. 03/09/18 undervaluation today patient appears to be doing fairly well in regard to her wounds in general. She has been tolerating the dressing changes without complication. Overall I'm pleased with the progress that seems to be made week by week especially in regard to the sacral ulcer. Her daughter and the caregiver seem to take very good care of her. With that being said she has very little undermining in regard to the sacral wound in good granulation she also has good epithelialization noted. 03/23/18 on evaluation today patient appears to be doing rather well in regard to her sacral wound. Unfortunately she does have a little bit of necrotic tissue noted in the central portion of her wound on the left elbow. This is due to the fact that she is lying on this arm seeing how it is contracted. Currently her daughter has started to avoid lying on the side it all due to the fact that the necrotic tissue was noted. With that being said they have been taking very good care of her in my pinion. Fortunately there does not appear to be any evidence of infection which is good news. Overall I'm pleased with the progress she's made other than in regard to the elbow. 04/06/18 on evaluation today patient actually appears to be doing okay in regard to the sacral wound. In fact it appears to be somewhat smaller in general although there does appear to be more  undermining at the 6 o'clock location than was previously noted. She has been tolerating the dressing changes without complication in general which is also good news. Nonetheless she does have a new small skin tear/opening on her leg which was evaluated today. Fortunately this appears to be minimal and the Xeroform gauze which has been used up to this point in the past has been utilized very effectively seems ALAYZA, PIEPER. ([id number]) to be doing well in that regard. Her left elbow ulcer seems to also be doing excellent at this point making good progress. 04/20/18 on evaluation today patient appears to be doing fairly well in regard to her sacral wound. This definitely seems to be better than during last evaluation where it was indeed infected. With that being said she has been tolerating the dressing changes as best it  can be expected. Her elbow seems to be drying out and a lot of times the dressing is getting stuck according to her caregiver. 05/04/18-She is seen in follow-up evaluation for a sacral and left elbow pressure ulcer. These are stable/improved and we will continue with same treatment plan and she will follow-up in 2 weeks 05/18/18 on evaluation today patient actually appears to be doing better in regard to her left elbow ulcer. The sacral wound is also shown signs of improvement which is good news. With that being said she does have a new right medial lower extremity ulcer which actually appears to show some signs of cellulitis/infection. She is previously taken Augmentin with good result. Nonetheless the patient really does not have anything that I can culture at the site there's actually some good epithelialization noted although again there is some cellulitis appearance as well. 06/01/18 on evaluation today patient appears to actually be doing very well in general in regard to her left elbow wound which is smaller in her lower extremity ulcer which is actually healed. With that being  said the sacral wound in particular show signs of improvement although she still has undermining in the six-7 o'clock location. No fevers, chills, nausea, or vomiting noted at this time. 06/15/18 on evaluation today patient actually appears to be doing excellent in regard to her elbow ulcer on the left elbow. This is shown signs of great improvement and I do feel like she is very close to healing in this regard. In general her sacral wound also appears to be doing well there's good sign of improvement there as well as far as the overall surface of the wound is concerned. She does have a significant area of tunneling at the 6 o'clock location that still is about the same really there's no significant improvement in that regard. Nonetheless we are attempting to try and treat this by way of packing with Prisma followed by silver so to help control moisture. The patient been tolerating the dressing changes without complication. 06/29/18 on evaluation today patient actually appears to be doing very well in regard to her left elbow ulcer and her sacral ulcer. She has been tolerating the dressing changes without complication which is good news. In fact the sacral wound appears to have almost completely closed although she does have an area of undermining/tunneling at roughly the 7 o'clock location that is still unfortunately having a more difficult time closing although as opposed to during the last visit I was unable to get a normal Q-tip down into the undermined region. Actually had to use a skinny probe or else the backside of the Q-tip were just a wooden stake was. I think this is a good sign that this area seems to be filling in which is wonderful. Overall I'm pleased with how things are progressing. 07/13/18 on evaluation today patient appears to be doing a little worse in regard to the overall appearance of the sacral wound. She has been tolerating the dressing changes without complication. With that  being said the patient's daughter tells me that the home health nurse actually dug around it significantly in the 12 o'clock location of the wound and subsequently reopen this area which was very well healed previous. This was a new nurse that hasn't generally been coming out to see the patient. Nonetheless they are very upset about this and the fact that the wound was actually worse when she was done "taking". It does appear that the wound is recovering and in  fact since that point has closed quite significantly compared to what they tell me it looked like when she was done. Patient History Unable to Obtain Patient History due to Altered Mental Status. Information obtained from Patient. Social History Never smoker, Marital Status - Married, Alcohol Use - Never, Drug Use - No History, Caffeine Use - Never. Medical And Surgical History Notes Ear/Nose/Mouth/Throat dysphagia Neurologic parkinsons, tremor Review of Systems (ROS) Constitutional Symptoms (General Health) [name], [name]. ([id number]) Denies complaints or symptoms of Fever, Chills. Respiratory The patient has no complaints or symptoms. Cardiovascular The patient has no complaints or symptoms. Psychiatric The patient has no complaints or symptoms. Objective Constitutional Well-nourished and well-hydrated in no acute distress. Vitals Time Taken: 10:12 AM, Height: 60 in, Weight: 90 lbs, BMI: 17.6, Temperature: 97.8 F, Pulse: 60 bpm, Respiratory Rate: 14 breaths/min, Blood Pressure: 139/51 mmHg. Respiratory normal breathing without difficulty. clear to auscultation bilaterally. Cardiovascular regular rate and rhythm with normal S1, S2. Psychiatric this patient is able to make decisions and demonstrates good insight into disease process. Alert and Oriented x 3. pleasant and cooperative. General Notes: Patient's wound bed currently shows evidence of improvement in regard to the undermining at the six to 8 o'clock  location. It also appears that the area at the 12 o'clock location where there was a reopening has actually still down and close there's just a superficial area which is actually not connected to the original wound but more proximal that is showing signs of healing but is superficially open at this point. Integumentary (Hair, Skin) Wound #1 status is Open. Original cause of wound was Pressure Injury. The wound is located on the Midline Coccyx. The wound measures 3.4cm length x 1.5cm width x 0.3cm depth; 4.006cm^2 area and 1.202cm^3 volume. There is muscle and Fat Layer (Subcutaneous Tissue) Exposed exposed. There is no tunneling noted, however, there is undermining starting at 6:00 and ending at 8:00 with a maximum distance of 1.7cm. There is a medium amount of serosanguineous drainage noted. The wound margin is epibole. There is large (67-100%) red, hyper - granulation within the wound bed. There is no necrotic tissue within the wound bed. The periwound skin appearance exhibited: Induration, Rash, Maceration. The periwound skin appearance did not exhibit: Callus, Crepitus, Excoriation, Scarring, Dry/Scaly, Atrophie Blanche, Cyanosis, Ecchymosis, Hemosiderin Staining, Mottled, Pallor, Rubor, Erythema. Periwound temperature was noted as No Abnormality. The periwound has tenderness on palpation. Wound #9 status is Open. Original cause of wound was Trauma. The wound is located on the Left Elbow. The wound measures 0.4cm length x 0.4cm width x 0.1cm depth; 0.126cm^2 area and 0.013cm^3 volume. The wound is limited to skin breakdown. There is no tunneling or undermining noted. There is a small amount of serous drainage noted. The wound margin is flat and intact. There is no granulation within the wound bed. There is no necrotic tissue within the wound bed. The periwound skin appearance did not exhibit: Callus, Crepitus, Excoriation, Induration, Rash, Scarring, Dry/Scaly, Maceration, Atrophie Blanche,  Cyanosis, Ecchymosis, Hemosiderin Staining, Mottled, Pallor, Rubor, Erythema. [name], [name] ([id number]) Assessment Active Problems ICD-10 Pressure ulcer of sacral region, stage 3 Pressure ulcer of left elbow, stage 3 Parkinson's disease Cellulitis of back [any part except buttock] Laceration without foreign body, right lower leg, subsequent encounter Plan Wound Cleansing: Wound #1 Midline Coccyx: Clean wound with Normal Saline. May Shower, gently pat wound dry prior to applying new dressing. Wound #9 Left Elbow: Clean wound with Normal Saline. May Shower, gently pat wound dry prior to  applying new dressing. Anesthetic (add to Medication List): Wound #1 Midline Coccyx: Topical Lidocaine 4% cream applied to wound bed prior to debridement (In Clinic Only). Wound #9 Left Elbow: Topical Lidocaine 4% cream applied to wound bed prior to debridement (In Clinic Only). Skin Barriers/Peri-Wound Care: Wound #1 Midline Coccyx: Skin Prep - use only where the allevyn sticks not on reddened areas Antifungal powder-Nystatin - around peri-wound on coccyx (do not get in wound), under right breast and right arm, right abdomen Primary Wound Dressing: Wound #1 Midline Coccyx: Silver Alginate - silvercel Ag non-adherent Silver Collagen - prisma ag and pack into undermining as well slightly moisten with hydrogel or KY Jelly and then place silvercel Ag non-adherent on top of the silver collagen Wound #9 Left Elbow: Hydrafera Blue Ready Transfer Secondary Dressing: Wound #1 Midline Coccyx: Boardered Foam Dressing - HHRN PLEASE ORDER ALLEVYN LIFE BORDERED FOAM FOR PATIENT (OFF BRANDS IRRITATE HER SKIN) Wound #9 Left Elbow: Non-adherent pad Other - stretch netting #4 Dressing Change Frequency: Wound #1 Midline Coccyx: Change dressing every day. Wound #9 Left Elbow: Change dressing every day. Follow-up Appointments: Return Appointment in 2 weeks. [name], [name]  ([id number]) Off-Loading: Wound #1 Midline Coccyx: Roho cushion for wheelchair Mattress - fluidized air mattress Turn and reposition every 2 hours Additional Orders / Instructions: Wound #1 Midline Coccyx: Increase protein intake. - please add protein supplements to patients diet Other: - please add vitamin A, vitamin C and zinc supplements to patients diet Wound #9 Left Elbow: Increase protein intake. - please add protein supplements to patients diet Other: - please add vitamin A, vitamin C and zinc supplements to patients diet Home Health: Wound #1 Midline Coccyx: Continue Home Health Visits - [facility] Nurse may visit PRN to address patient s wound care needs. FACE TO FACE ENCOUNTER: MEDICARE and MEDICAID PATIENTS: I certify that this patient is under my care and that I had a face-to-face encounter that meets the physician face-to-face encounter requirements with this patient on this date. The encounter with the patient was in whole or in part for the following MEDICAL CONDITION: (primary reason for Home Healthcare) MEDICAL NECESSITY: I certify, that based on my findings, NURSING services are a medically necessary home health service. HOME BOUND STATUS: I certify that my clinical findings support that this patient is homebound (i.e., Due to illness or injury, pt requires aid of supportive devices such as crutches, cane, wheelchairs, walkers, the use of special transportation or the assistance of another person to leave their place of residence. There is a normal inability to leave the home and doing so requires considerable and taxing effort. Other absences are for medical reasons / religious services and are infrequent or of short duration when for other reasons). If current dressing causes regression in wound condition, may D/C ordered dressing product/s and apply Normal Saline Moist Dressing daily until next Wound Healing Center / Other MD appointment. Notify Wound Healing  Center of regression in wound condition at [phone]. Please direct any NON-WOUND related issues/requests for orders to patient's Primary Care Physician - Dr Vonita Moss Wound #9 Left Elbow: Continue Home Health Visits - [facility] Nurse may visit PRN to address patient s wound care needs. FACE TO FACE ENCOUNTER: MEDICARE and MEDICAID PATIENTS: I certify that this patient is under my care and that I had a face-to-face encounter that meets the physician face-to-face encounter requirements with this patient on this date. The encounter with the patient was in whole or in part  for the following MEDICAL CONDITION: (primary reason for Home Healthcare) MEDICAL NECESSITY: I certify, that based on my findings, NURSING services are a medically necessary home health service. HOME BOUND STATUS: I certify that my clinical findings support that this patient is homebound (i.e., Due to illness or injury, pt requires aid of supportive devices such as crutches, cane, wheelchairs, walkers, the use of special transportation or the assistance of another person to leave their place of residence. There is a normal inability to leave the home and doing so requires considerable and taxing effort. Other absences are for medical reasons / religious services and are infrequent or of short duration when for other reasons). If current dressing causes regression in wound condition, may D/C ordered dressing product/s and apply Normal Saline Moist Dressing daily until next Wound Healing Center / Other MD appointment. Notify Wound Healing Center of regression in wound condition at [phone]. Please direct any NON-WOUND related issues/requests for orders to patient's Primary Care Physician - Dr Vonita Moss I'm gonna recommend currently that we continue with the above wound care measures for the next week. I also did make a change in the dressings for the elbow will see how that does as well. If anything changes  or worsens meantime the patient will let me know or rather the patient's family will. Please see above for specific wound care orders. We will see patient for re-evaluation in 2 week(s) here in the clinic. If anything worsens or changes patient will contact our office for additional recommendations. Electronic Signature(s) Signed: 07/15/2018 5:27:38 PM By: Riley Lam, Raven ([id number]) Entered By: Lenda Kelp on 07/15/2018 17:11:52 [name] ([id number]) -------------------------------------------------------------------------------- ROS/PFSH Details Patient Name: [name] Date of Service: 07/13/2018 10:00 AM Medical Record Number: [id number] Patient Account Number: [id number] Date of Birth/Sex: 05-Nov-1922 (84 y.o. F) Treating RN: Curtis Sites Primary Care Provider: Vonita Moss Other Clinician: Referring Provider: Vonita Moss Treating Provider/Extender: Linwood Dibbles, HOYT Weeks in Treatment: 32 Unable to Obtain Patient History due to oo Altered Mental Status Information Obtained From Patient Wound History Do you currently have one or more open woundso Yes How many open wounds do you currently haveo 1 Approximately how long have you had your woundso 1 week How have you been treating your wound(s) until nowo santyl and gauze Has your wound(s) ever healed and then re-openedo No Have you had any lab work done in the past montho No Have you tested positive for an antibiotic resistant organism (MRSA, VRE)o No Have you tested positive for osteomyelitis (bone infection)o No Have you had any tests for circulation on your legso No Constitutional Symptoms (General Health) Complaints and Symptoms: Negative for: Fever; Chills Eyes Medical History: Negative for: Cataracts; Glaucoma; Optic Neuritis Ear/Nose/Mouth/Throat Medical History: Negative for: Chronic sinus problems/congestion; Middle ear problems Past Medical History  Notes: dysphagia Hematologic/Lymphatic Medical History: Positive for: Anemia Negative for: Hemophilia; Human Immunodeficiency Virus; Lymphedema; Sickle Cell Disease Respiratory Complaints and Symptoms: No Complaints or Symptoms Medical History: Negative for: Aspiration; Asthma; Chronic Obstructive Pulmonary Disease (COPD); Pneumothorax; Sleep Apnea; Tuberculosis Cardiovascular SHANTRICE, RODENBERG. ([id number]) Complaints and Symptoms: No Complaints or Symptoms Medical History: Positive for: Hypertension Negative for: Angina; Arrhythmia; Congestive Heart Failure; Coronary Artery Disease; Deep Vein Thrombosis; Hypotension; Myocardial Infarction; Peripheral Arterial Disease; Peripheral Venous Disease; Phlebitis; Vasculitis Gastrointestinal Medical History: Negative for: Cirrhosis ; Colitis; Crohnos; Hepatitis A; Hepatitis B; Hepatitis C Endocrine Medical History: Negative for: Type I Diabetes; Type II Diabetes Genitourinary Medical History: Negative for:  End Stage Renal Disease Immunological Medical History: Negative for: Lupus Erythematosus; Raynaudos; Scleroderma Integumentary (Skin) Medical History: Negative for: History of Burn; History of pressure wounds Musculoskeletal Medical History: Negative for: Gout; Rheumatoid Arthritis; Osteoarthritis; Osteomyelitis Neurologic Medical History: Positive for: Dementia Negative for: Neuropathy; Quadriplegia; Paraplegia; Seizure Disorder Past Medical History Notes: parkinsons, tremor Oncologic Medical History: Negative for: Received Chemotherapy; Received Radiation Psychiatric Complaints and Symptoms: No Complaints or Symptoms Medical History: Negative for: Anorexia/bulimia; Confinement Anxiety ALYZAH, PELLY ([id number]) Immunizations Pneumococcal Vaccine: Received Pneumococcal Vaccination: Yes Immunization Notes: up to date Implantable Devices Family and Social History Never smoker; Marital Status - Married; Alcohol  Use: Never; Drug Use: No History; Caffeine Use: Never; Financial Concerns: No; Food, Clothing or Shelter Needs: No; Support System Lacking: No; Transportation Concerns: No; Advanced Directives: Yes (Not Provided); Patient does not want information on Advanced Directives; Medical Power of Attorney: Yes - dtr (Not Provided) Physician Affirmation I have reviewed and agree with the above information. Electronic Signature(s) Signed: 07/15/2018 5:25:24 PM By: Curtis Sites Signed: 07/15/2018 5:27:38 PM By: Lenda Kelp PA-C Entered By: Lenda Kelp on 07/15/2018 17:11:24 [name] ([id number]) -------------------------------------------------------------------------------- SuperBill Details Patient Name: [name] Date of Service: 07/13/2018 Medical Record Number: [id number] Patient Account Number: [id number] Date of Birth/Sex: August 01, 1923 (84 y.o. F) Treating RN: Curtis Sites Primary Care Provider: Vonita Moss Other Clinician: Referring Provider: Vonita Moss Treating Provider/Extender: Linwood Dibbles, HOYT Weeks in Treatment: 78 Diagnosis Coding ICD-10 Codes Code Description L89.153 Pressure ulcer of sacral region, stage 3 L89.023 Pressure ulcer of left elbow, stage 3 G20 Parkinson's disease L03.312 Cellulitis of back [any part except buttock] S81.811D Laceration without foreign body, right lower leg, subsequent encounter Facility Procedures CPT4 Code: 08657846 Description: 99213 - WOUND CARE VISIT-LEV 3 EST PT Modifier: Quantity: 1 Physician Procedures CPT4 Code: 9629528 Description: 99213 - WC PHYS LEVEL 3 - EST PT ICD-10 Diagnosis Description L89.153 Pressure ulcer of sacral region, stage 3 L89.023 Pressure ulcer of left elbow, stage 3 G20 Parkinson's disease L03.312 Cellulitis of back [any part except buttock] Modifier: Quantity: 1 Electronic Signature(s) Signed: 07/14/2018 11:13:36 AM By: Lenda Kelp PA-C Entered By: Lenda Kelp on 07/14/2018  10:43:59

## 2018-07-20 ENCOUNTER — Telehealth: Payer: Self-pay | Admitting: Family Medicine

## 2018-07-20 NOTE — Telephone Encounter (Signed)
Copied from CRM [id number]. Topic: Quick Communication - See Telephone Encounter >> Jul 20, 2018  3:22 PM Lorrine Kin, [place] wrote: CRM for notification. See Telephone encounter for: 07/20/18. Rodney Booze with [facility] [employer] calling and is requesting the September and October wound notes. Needs it to include size of wound and providers signature. Please advise. CB#: [phone] ext [phone] Fax#: [phone]

## 2018-07-20 NOTE — Telephone Encounter (Signed)
They will need to get this from the wound provider.

## 2018-07-20 NOTE — Telephone Encounter (Signed)
Attempted to reach x 3. "All Circuits are busy"

## 2018-07-20 NOTE — Telephone Encounter (Signed)
Still awaiting fax. Will send to provider once received.

## 2018-07-21 NOTE — Telephone Encounter (Signed)
Attempted to reach again. All circuits are still busy

## 2018-07-22 NOTE — Telephone Encounter (Signed)
Attempted to reach again. And again got message that "all circuits are busy". This information will need to come from wound center. Will close encounter until they call us back

## 2018-07-27 ENCOUNTER — Encounter: Payer: Medicare Other | Admitting: Physician Assistant

## 2018-07-27 DIAGNOSIS — L89153 Pressure ulcer of sacral region, stage 3: Secondary | ICD-10-CM | POA: Diagnosis not present

## 2018-07-31 NOTE — Progress Notes (Signed)
[name] ([id number]) Visit Report for 07/27/2018 Arrival Information Details Patient Name: [name], [name] Date of Service: 07/27/2018 10:00 AM Medical Record Number: [id number] Patient Account Number: [id number] Date of Birth/Sex: 09-01-1923 (84 y.o. F) Treating RN: Huel Coventry Primary Care Artie Takayama: Vonita Moss Other Clinician: Referring Holmes Hays: Vonita Moss Treating Raheim Beutler/Extender: Linwood Dibbles, HOYT Weeks in Treatment: 80 Visit Information History Since Last Visit Added or deleted any medications: No Patient Arrived: Wheel Chair Any new allergies or adverse reactions: No Arrival Time: 09:45 Had a fall or experienced change in No Accompanied By: daughter and activities of daily living that may affect caregiver risk of falls: Transfer Assistance: Manual Signs or symptoms of abuse/neglect since last visito No Patient Identification Verified: Yes Hospitalized since last visit: No Secondary Verification Process Yes Implantable device outside of the clinic excluding No Completed: cellular tissue based products placed in the center Patient Requires Transmission-Based No since last visit: Precautions: Has Dressing in Place as Prescribed: Yes Patient Has Alerts: No Pain Present Now: No Electronic Signature(s) Signed: 07/27/2018 5:02:25 PM By: Curtis Sites Entered By: Curtis Sites on 07/27/2018 10:35:25 [name] ([id number]) -------------------------------------------------------------------------------- Clinic Level of Care Assessment Details Patient Name: [name] Date of Service: 07/27/2018 10:00 AM Medical Record Number: [id number] Patient Account Number: [id number] Date of Birth/Sex: 1923-03-27 (84 y.o. F) Treating RN: Curtis Sites Primary Care Haru Shaff: Vonita Moss Other Clinician: Referring Davidlee Jeanbaptiste: Vonita Moss Treating Judythe Postema/Extender: Linwood Dibbles, HOYT Weeks in Treatment: 80 Clinic Level of Care Assessment Items TOOL 4  Quantity Score []  - Use when only an EandM is performed on FOLLOW-UP visit 0 ASSESSMENTS - Nursing Assessment / Reassessment X - Reassessment of Co-morbidities (includes updates in patient status) 1 10 X- 1 5 Reassessment of Adherence to Treatment Plan ASSESSMENTS - Wound and Skin Assessment / Reassessment []  - Simple Wound Assessment / Reassessment - one wound 0 X- 2 5 Complex Wound Assessment / Reassessment - multiple wounds X- 1 10 Dermatologic / Skin Assessment (not related to wound area) ASSESSMENTS - Focused Assessment []  - Circumferential Edema Measurements - multi extremities 0 []  - 0 Nutritional Assessment / Counseling / Intervention []  - 0 Lower Extremity Assessment (monofilament, tuning fork, pulses) []  - 0 Peripheral Arterial Disease Assessment (using hand held doppler) ASSESSMENTS - Ostomy and/or Continence Assessment and Care X - Incontinence Assessment and Management 1 10 []  - 0 Ostomy Care Assessment and Management (repouching, etc.) PROCESS - Coordination of Care X - Simple Patient / Family Education for ongoing care 1 15 []  - 0 Complex (extensive) Patient / Family Education for ongoing care []  - 0 Staff obtains Chiropractor, Records, Test Results / Process Orders []  - 0 Staff telephones HHA, Nursing Homes / Clarify orders / etc []  - 0 Routine Transfer to another Facility (non-emergent condition) []  - 0 Routine Hospital Admission (non-emergent condition) []  - 0 New Admissions / Manufacturing engineer / Ordering NPWT, Apligraf, etc. []  - 0 Emergency Hospital Admission (emergent condition) X- 1 10 Simple Discharge Coordination SHANTAYA, BLUESTONE ([id number]) []  - 0 Complex (extensive) Discharge Coordination PROCESS - Special Needs []  - Pediatric / Minor Patient Management 0 []  - 0 Isolation Patient Management []  - 0 Hearing / Language / Visual special needs []  - 0 Assessment of Community assistance (transportation, D/C planning, etc.) []  - 0 Additional  assistance / Altered mentation []  - 0 Support Surface(s) Assessment (bed, cushion, seat, etc.) INTERVENTIONS - Wound Cleansing / Measurement []  - Simple Wound Cleansing - one wound 0 X- 2 5  Complex Wound Cleansing - multiple wounds X- 1 5 Wound Imaging (photographs - any number of wounds) []  - 0 Wound Tracing (instead of photographs) []  - 0 Simple Wound Measurement - one wound X- 2 5 Complex Wound Measurement - multiple wounds INTERVENTIONS - Wound Dressings X - Small Wound Dressing one or multiple wounds 2 10 []  - 0 Medium Wound Dressing one or multiple wounds []  - 0 Large Wound Dressing one or multiple wounds []  - 0 Application of Medications - topical []  - 0 Application of Medications - injection INTERVENTIONS - Miscellaneous []  - External ear exam 0 []  - 0 Specimen Collection (cultures, biopsies, blood, body fluids, etc.) []  - 0 Specimen(s) / Culture(s) sent or taken to Lab for analysis []  - 0 Patient Transfer (multiple staff / Nurse, adult / Similar devices) []  - 0 Simple Staple / Suture removal (25 or less) []  - 0 Complex Staple / Suture removal (26 or more) []  - 0 Hypo / Hyperglycemic Management (close monitor of Blood Glucose) []  - 0 Ankle / Brachial Index (ABI) - do not check if billed separately X- 1 5 Vital Signs Streight, Rigby W. ([id number]) Has the patient been seen at the hospital within the last three years: Yes Total Score: 120 Level Of Care: New/Established - Level 4 Electronic Signature(s) Signed: 07/27/2018 5:02:25 PM By: Curtis Sites Entered By: Curtis Sites on 07/27/2018 10:41:57 [name] ([id number]) -------------------------------------------------------------------------------- Encounter Discharge Information Details Patient Name: [name] Date of Service: 07/27/2018 10:00 AM Medical Record Number: [id number] Patient Account Number: [id number] Date of Birth/Sex: 08-25-1923 (84 y.o. F) Treating RN: Curtis Sites Primary Care Saree Krogh: Vonita Moss Other Clinician: Referring Tayton Decaire: Vonita Moss Treating Tyr Franca/Extender: Linwood Dibbles, HOYT Weeks in Treatment: 14 Encounter Discharge Information Items Discharge Condition: Stable Ambulatory Status: Wheelchair Discharge Destination: Home Transportation: Private Auto Accompanied By: daughter and caregiver Schedule Follow-up Appointment: Yes Clinical Summary of Care: Electronic Signature(s) Signed: 07/27/2018 5:02:25 PM By: Curtis Sites Entered By: Curtis Sites on 07/27/2018 10:43:44 [name] ([id number]) -------------------------------------------------------------------------------- Lower Extremity Assessment Details Patient Name: [name] Date of Service: 07/27/2018 10:00 AM Medical Record Number: [id number] Patient Account Number: [id number] Date of Birth/Sex: 10-13-1922 (84 y.o. F) Treating RN: Huel Coventry Primary Care Marston Mccadden: Vonita Moss Other Clinician: Referring Angellee Cohill: Vonita Moss Treating Kamry Faraci/Extender: Skeet Simmer in Treatment: 43 Electronic Signature(s) Signed: 07/29/2018 7:26:08 AM By: Elliot Gurney, BSN, RN, CWS, Kim RN, BSN Entered By: Elliot Gurney, BSN, RN, CWS, Kim on 07/27/2018 10:12:19 [name] ([id number]) -------------------------------------------------------------------------------- Multi Wound Chart Details Patient Name: [name] Date of Service: 07/27/2018 10:00 AM Medical Record Number: [id number] Patient Account Number: [id number] Date of Birth/Sex: 08/27/1923 (84 y.o. F) Treating RN: Curtis Sites Primary Care Roselinda Bahena: Vonita Moss Other Clinician: Referring Aubra Pappalardo: Vonita Moss Treating Zaraya Delauder/Extender: Linwood Dibbles, HOYT Weeks in Treatment: 80 Vital Signs Height(in): 60 Pulse(bpm): 55 Weight(lbs): 90 Blood Pressure(mmHg): 145/90 Body Mass Index(BMI): 18 Temperature(F): 98.3 Respiratory Rate 14 (breaths/min): Photos: [1:No Photos] [9:No  Photos] [N/A:N/A] Wound Location: [1:Coccyx - Midline] [9:Left Elbow] [N/A:N/A] Wounding Event: [1:Pressure Injury] [9:Trauma] [N/A:N/A] Primary Etiology: [1:Pressure Ulcer] [9:Trauma, Other] [N/A:N/A] Comorbid History: [1:Anemia, Hypertension, Dementia] [9:N/A] [N/A:N/A] Date Acquired: [1:12/01/2016] [9:02/16/2018] [N/A:N/A] Weeks of Treatment: [1:80] [9:22] [N/A:N/A] Wound Status: [1:Open] [9:Open] [N/A:N/A] Measurements L x W x D [1:0.3x0.9x0.2] [9:0.2x0.2x0.1] [N/A:N/A] (cm) Area (cm) : [1:0.212] [9:0.031] [N/A:N/A] Volume (cm) : [1:0.042] [9:0.003] [N/A:N/A] % Reduction in Area: [1:94.90%] [9:97.40%] [N/A:N/A] % Reduction in Volume: [1:97.50%] [9:97.50%] [N/A:N/A] Starting Position 1 [1:6] (o'clock): Ending Position  1 [1:12] (o'clock): Maximum Distance 1 (cm): [1:1] Undermining: [1:Yes] [9:N/A] [N/A:N/A] Classification: [1:Category/Stage IV] [9:Partial Thickness] [N/A:N/A] Exudate Amount: [1:Medium] [9:N/A] [N/A:N/A] Exudate Type: [1:Serosanguineous] [9:N/A] [N/A:N/A] Exudate Color: [1:red, brown] [9:N/A] [N/A:N/A] Wound Margin: [1:Epibole] [9:N/A] [N/A:N/A] Granulation Amount: [1:Large (67-100%)] [9:N/A] [N/A:N/A] Granulation Quality: [1:Red, Hyper-granulation] [9:N/A] [N/A:N/A] Necrotic Amount: [1:None Present (0%)] [9:N/A] [N/A:N/A] Exposed Structures: [1:Fat Layer (Subcutaneous Tissue) Exposed: Yes Muscle: Yes Fascia: No Tendon: No Joint: No Bone: No] [9:N/A] [N/A:N/A] Epithelialization: [1:None] [9:N/A] [N/A:N/A] Periwound Skin Texture: Induration: Yes No Abnormalities Noted N/A Rash: Yes Excoriation: No Callus: No Crepitus: No Scarring: No Periwound Skin Moisture: Maceration: Yes No Abnormalities Noted N/A Dry/Scaly: No Periwound Skin Color: Atrophie Blanche: No No Abnormalities Noted N/A Cyanosis: No Ecchymosis: No Erythema: No Hemosiderin Staining: No Mottled: No Pallor: No Rubor: No Temperature: No Abnormality N/A N/A Tenderness on Palpation: Yes No  N/A Wound Preparation: Ulcer Cleansing: N/A N/A Rinsed/Irrigated with Saline Topical Anesthetic Applied: Other: lidocaine 4% Treatment Notes Electronic Signature(s) Signed: 07/27/2018 5:02:25 PM By: Curtis Sites Entered By: Curtis Sites on 07/27/2018 10:36:19 [name] ([id number]) -------------------------------------------------------------------------------- Multi-Disciplinary Care Plan Details Patient Name: [name] Date of Service: 07/27/2018 10:00 AM Medical Record Number: [id number] Patient Account Number: [id number] Date of Birth/Sex: 1923-07-31 (84 y.o. F) Treating RN: Curtis Sites Primary Care Evyn Kooyman: Vonita Moss Other Clinician: Referring Jadah Bobak: Vonita Moss Treating Nash Bolls/Extender: Linwood Dibbles, HOYT Weeks in Treatment: 80 Active Inactive ` Abuse / Safety / Falls / Self Care Management Nursing Diagnoses: Impaired physical mobility Potential for falls Goals: Patient will remain injury free Date Initiated: 01/07/2017 Target Resolution Date: 04/03/2017 Goal Status: Active Interventions: Assess fall risk on admission and as needed Notes: ` Nutrition Nursing Diagnoses: Potential for alteratiion in Nutrition/Potential for imbalanced nutrition Goals: Patient/caregiver agrees to and verbalizes understanding of need to use nutritional supplements and/or vitamins as prescribed Date Initiated: 01/07/2017 Target Resolution Date: 04/03/2017 Goal Status: Active Interventions: Assess patient nutrition upon admission and as needed per policy Notes: ` Orientation to the Wound Care Program Nursing Diagnoses: Knowledge deficit related to the wound healing center program Goals: Patient/caregiver will verbalize understanding of the Wound Healing Center Program Date Initiated: 01/07/2017 Target Resolution Date: 04/03/2017 Goal Status: Active URA, YINGLING ([id number]) Interventions: Provide education on orientation to the wound  center Notes: ` Pressure Nursing Diagnoses: Knowledge deficit related to causes and risk factors for pressure ulcer development Goals: Patient will remain free from development of additional pressure ulcers Date Initiated: 01/07/2017 Target Resolution Date: 04/03/2017 Goal Status: Active Interventions: Assess potential for pressure ulcer upon admission and as needed Notes: ` Wound/Skin Impairment Nursing Diagnoses: Impaired tissue integrity Goals: Patient/caregiver will verbalize understanding of skin care regimen Date Initiated: 01/07/2017 Target Resolution Date: 04/03/2017 Goal Status: Active Ulcer/skin breakdown will have a volume reduction of 30% by week 4 Date Initiated: 01/07/2017 Target Resolution Date: 04/03/2017 Goal Status: Active Ulcer/skin breakdown will have a volume reduction of 50% by week 8 Date Initiated: 01/07/2017 Target Resolution Date: 04/03/2017 Goal Status: Active Ulcer/skin breakdown will have a volume reduction of 80% by week 12 Date Initiated: 01/07/2017 Target Resolution Date: 04/03/2017 Goal Status: Active Ulcer/skin breakdown will heal within 14 weeks Date Initiated: 01/07/2017 Target Resolution Date: 04/03/2017 Goal Status: Active Interventions: Assess patient/caregiver ability to obtain necessary supplies Assess patient/caregiver ability to perform ulcer/skin care regimen upon admission and as needed Assess ulceration(s) every visit Notes: Electronic Signature(s) KAMORAH, NEVILS ([id number]) Signed: 07/27/2018 5:02:25 PM By: Curtis Sites Entered By: Curtis Sites on 07/27/2018 10:36:11 Layson, Myles Gip. (  [id number]) -------------------------------------------------------------------------------- Pain Assessment Details Patient Name: BABARA, BUFFALO Date of Service: 07/27/2018 10:00 AM Medical Record Number: [id number] Patient Account Number: [id number] Date of Birth/Sex: [birth year] (84 y.o. F) Treating RN: Huel Coventry Primary Care [place] Kinyon:  Vonita Moss Other Clinician: Referring Awa Bachicha: Vonita Moss Treating Nai Borromeo/Extender: Linwood Dibbles, HOYT Weeks in Treatment: 80 Active Problems Location of Pain Severity and Description of Pain Patient Has Paino No Site Locations Pain Management and Medication Current Pain Management: Electronic Signature(s) Signed: 07/27/2018 5:02:25 PM By: Curtis Sites Signed: 07/29/2018 7:26:08 AM By: Elliot Gurney, BSN, RN, CWS, Kim RN, BSN Entered By: Curtis Sites on 07/27/2018 10:35:33 [name] ([id number]) -------------------------------------------------------------------------------- Patient/Caregiver Education Details Patient Name: [name] Date of Service: 07/27/2018 10:00 AM Medical Record Number: [id number] Patient Account Number: [id number] Date of Birth/Gender: November 06, 1922 (84 y.o. F) Treating RN: Curtis Sites Primary Care Physician: Vonita Moss Other Clinician: Referring Physician: Vonita Moss Treating Physician/Extender: Skeet Simmer in Treatment: 20 Education Assessment Education Provided To: Caregiver Education Topics Provided Pressure: Handouts: Other: pressure relief Wound/Skin Impairment: Handouts: Other: wound care as ordered Methods: Demonstration, Explain/Verbal Responses: State content correctly Electronic Signature(s) Signed: 07/27/2018 5:02:25 PM By: Curtis Sites Entered By: Curtis Sites on 07/27/2018 10:42:26 [name] ([id number]) -------------------------------------------------------------------------------- Wound Assessment Details Patient Name: [name] Date of Service: 07/27/2018 10:00 AM Medical Record Number: [id number] Patient Account Number: [id number] Date of Birth/Sex: [birth year] (84 y.o. F) Treating RN: Huel Coventry Primary Care Xianna Siverling: Vonita Moss Other Clinician: Referring Consuello Lassalle: Vonita Moss Treating Maritta Kief/Extender: Linwood Dibbles, HOYT Weeks in Treatment: 80 Wound Status Wound  Number: 1 Primary Etiology: Pressure Ulcer Wound Location: Coccyx - Midline Wound Status: Open Wounding Event: Pressure Injury Comorbid History: Anemia, Hypertension, Dementia Date Acquired: 12/01/2016 Weeks Of Treatment: 80 Clustered Wound: No Photos Photo Uploaded By: Elliot Gurney, BSN, RN, CWS, Kim on 07/27/2018 16:36:57 Wound Measurements Length: (cm) 0.3 % Reducti Width: (cm) 0.9 % Reducti Depth: (cm) 0.2 Epithelia Area: (cm) 0.212 Tunnelin Volume: (cm) 0.042 Undermin Starti Ending Maximu on in Area: 94.9% on in Volume: 97.5% lization: None g: No ing: Yes ng Position (o'clock): 6 Position (o'clock): 12 m Distance: (cm) 1 Wound Description Classification: Category/Stage IV Foul Odor Wound Margin: Epibole Slough/Fi Exudate Amount: Medium Exudate Type: Serosanguineous Exudate Color: red, brown After Cleansing: No brino Yes Wound Bed Granulation Amount: Large (67-100%) Exposed Structure Granulation Quality: Red, Hyper-granulation Fascia Exposed: No Necrotic Amount: None Present (0%) Fat Layer (Subcutaneous Tissue) Exposed: Yes Tendon Exposed: No Muscle Exposed: Yes EVERLEY, EVORA. ([id number]) Necrosis of Muscle: No Joint Exposed: No Bone Exposed: No Periwound Skin Texture Texture Color No Abnormalities Noted: No No Abnormalities Noted: No Callus: No Atrophie Blanche: No Crepitus: No Cyanosis: No Excoriation: No Ecchymosis: No Induration: Yes Erythema: No Rash: Yes Hemosiderin Staining: No Scarring: No Mottled: No Pallor: No Moisture Rubor: No No Abnormalities Noted: No Dry / Scaly: No Temperature / Pain Maceration: Yes Temperature: No Abnormality Tenderness on Palpation: Yes Wound Preparation Ulcer Cleansing: Rinsed/Irrigated with Saline Topical Anesthetic Applied: Other: lidocaine 4%, Treatment Notes Wound #1 (Midline Coccyx) 1. Cleansed with: Clean wound with Normal Saline 2. Anesthetic Topical Lidocaine 4% cream to wound bed prior to  debridement 4. Dressing Applied: Calcium Alginate with Silver Promogran 5. Secondary Dressing Applied Bordered Foam Dressing Notes promogran, silver cell, and BFD Electronic Signature(s) Signed: 07/29/2018 7:26:08 AM By: Elliot Gurney, BSN, RN, CWS, Kim RN, BSN Entered By: Elliot Gurney, BSN, RN, CWS, Kim on 07/27/2018 10:08:16 [name] ([id number]) -------------------------------------------------------------------------------- Wound Assessment Details Patient Name:  [name] Date of Service: 07/27/2018 10:00 AM Medical Record Number: [id number] Patient Account Number: [id number] Date of Birth/Sex: [birth year] (84 y.o. F) Treating RN: Huel Coventry Primary Care Hyman Crossan: Vonita Moss Other Clinician: Referring Jameila Keeny: Vonita Moss Treating Fuquan Wilson/Extender: Linwood Dibbles, HOYT Weeks in Treatment: 80 Wound Status Wound Number: 9 Primary Etiology: Trauma, Other Wound Location: Left Elbow Wound Status: Open Wounding Event: Trauma Date Acquired: 02/16/2018 Weeks Of Treatment: 22 Clustered Wound: No Photos Photo Uploaded By: Elliot Gurney, BSN, RN, CWS, Kim on 07/27/2018 16:37:43 Wound Measurements Length: (cm) 0.2 Width: (cm) 0.2 Depth: (cm) 0.1 Area: (cm) 0.031 Volume: (cm) 0.003 % Reduction in Area: 97.4% % Reduction in Volume: 97.5% Wound Description Classification: Partial Thickness Periwound Skin Texture Texture Color No Abnormalities Noted: No No Abnormalities Noted: No Moisture No Abnormalities Noted: No Treatment Notes Wound #9 (Left Elbow) 1. Cleansed with: Clean wound with Normal Saline 2. Anesthetic Topical Lidocaine 4% cream to wound bed prior to debridement 4. Dressing Applied: GRISEL, BLUMENSTOCK ([id number]) Xeroform 5. Secondary Dressing Applied Dry Gauze 7. Secured with Tape Notes conform and stretch net Electronic Signature(s) Signed: 07/29/2018 7:26:08 AM By: Elliot Gurney, BSN, RN, CWS, Kim RN, BSN Entered By: Elliot Gurney, BSN, RN, CWS, Kim on 07/27/2018  10:03:32 [name] ([id number]) -------------------------------------------------------------------------------- Vitals Details Patient Name: [name] Date of Service: 07/27/2018 10:00 AM Medical Record Number: [id number] Patient Account Number: [id number] Date of Birth/Sex: 04-15-1923 (84 y.o. F) Treating RN: Huel Coventry Primary Care Mancil Pfenning: Vonita Moss Other Clinician: Referring Zi Sek: Vonita Moss Treating Tanaja Ganger/Extender: Linwood Dibbles, HOYT Weeks in Treatment: 80 Vital Signs Time Taken: 09:50 Temperature (F): 98.3 Height (in): 60 Pulse (bpm): 55 Weight (lbs): 90 Respiratory Rate (breaths/min): 14 Body Mass Index (BMI): 17.6 Blood Pressure (mmHg): 145/90 Reference Range: 80 - 120 mg / dl Electronic Signature(s) Signed: 07/27/2018 5:02:25 PM By: Curtis Sites Entered By: Curtis Sites on 07/27/2018 10:35:40

## 2018-08-10 ENCOUNTER — Encounter: Payer: Medicare Other | Attending: Physician Assistant | Admitting: Physician Assistant

## 2018-08-10 DIAGNOSIS — L89153 Pressure ulcer of sacral region, stage 3: Secondary | ICD-10-CM | POA: Insufficient documentation

## 2018-08-10 DIAGNOSIS — L89023 Pressure ulcer of left elbow, stage 3: Secondary | ICD-10-CM | POA: Insufficient documentation

## 2018-08-10 DIAGNOSIS — Z888 Allergy status to other drugs, medicaments and biological substances status: Secondary | ICD-10-CM | POA: Insufficient documentation

## 2018-08-10 DIAGNOSIS — G20 Parkinson's disease: Secondary | ICD-10-CM | POA: Insufficient documentation

## 2018-08-10 DIAGNOSIS — F0280 Dementia in other diseases classified elsewhere without behavioral disturbance: Secondary | ICD-10-CM | POA: Diagnosis not present

## 2018-08-10 DIAGNOSIS — L03312 Cellulitis of back [any part except buttock]: Secondary | ICD-10-CM | POA: Insufficient documentation

## 2018-08-10 DIAGNOSIS — Z882 Allergy status to sulfonamides status: Secondary | ICD-10-CM | POA: Insufficient documentation

## 2018-08-10 DIAGNOSIS — I10 Essential (primary) hypertension: Secondary | ICD-10-CM | POA: Diagnosis not present

## 2018-08-12 NOTE — Progress Notes (Signed)
[name] ([id number]) Visit Report for 08/10/2018 Chief Complaint Document Details Patient Name: [name] Date of Service: 08/10/2018 10:00 AM Medical Record Number: [id number] Patient Account Number: [id number] Date of Birth/Sex: [birth year] (84 y.o. F) Treating RN: Curtis Sites Primary Care Provider: Vonita Moss Other Clinician: Referring Provider: Vonita Moss Treating Provider/Extender: Linwood Dibbles, HOYT Weeks in Treatment: 73 Information Obtained from: Patient Chief Complaint multiple wounds/ulcers Electronic Signature(s) Signed: 08/10/2018 5:50:45 PM By: Lenda Kelp PA-C Entered By: Lenda Kelp on 08/10/2018 10:07:05 [name] ([id number]) -------------------------------------------------------------------------------- HPI Details Patient Name: [name] Date of Service: 08/10/2018 10:00 AM Medical Record Number: [id number] Patient Account Number: [id number] Date of Birth/Sex: 05-23-1923 (84 y.o. F) Treating RN: Curtis Sites Primary Care Provider: Vonita Moss Other Clinician: Referring Provider: Vonita Moss Treating Provider/Extender: Linwood Dibbles, HOYT Weeks in Treatment: 41 History of Present Illness HPI Description: 01/07/17 this is a 84 year old woman admitted to the clinic today for review of a pressure ulcer on her lower sacrum. She is referred from her primary physician's office after being seen on 3/22 with a 3 cm pressure area. Her daughter and caretaker accompanied her today state that the area first became obvious about a month ago and his since deteriorated. They have recently got [name] home health involved and have been using [name] to the wound. They have ordered a pressure relief surface for her mattress. They are turning her religiously. They state that she eats well and they've been forcing fluids on her. She is on a multivitamin. Looking through [facility] point last albumin I see was 4.4 on 10/28. The  patient has advanced parkinsonism which looks superficially like advanced Parkinson's disease although her daughter tells me she did not ever respond to Sinemet therefore this may have another pathology with signs of parkinsonism. However I think this is largely a mute point currently. She also has dementia and is nonambulatory. Since this started they have been keeping her in bed and turning her religiously every 2 hours. She lives at home in [place] with her husband with 24/7 care giving 01/13/17 santyl change qd. Still will require further debridement. continue santyl. 01/20/17; patient's wound actually looks some better less adherent necrotic surface. There is actually visible granulation. We're using [name] 01/27/17; better-looking surface but still a lot of necrotic tissue on the base of this wound. The periwound erythema is better than last week we are still using [name]. Her daughter tells [place] that she is still having trouble with the pressure-relief mattress through medical modalities 02/03/18; I had the patient scheduled for a two week followup however her daughter brought her in early concerned for discoloration on 2 areas of the wound circumference. We have bee using santyl 02/10/17;Better looking surface to the wound. Rim appears better suggesting better offloading. Using santyl 02/24/17; change to Silver Collegen last time. Wound appears better. 03/10/17; still using silver collagen religious offloading. Intake is satisfactory per her daughter. Dimension slightly better 03/12/2017 -- Dr. Jannetta Quint patient who had been seen 2 days ago and was doing fairly well. The patient is brought in by her daughter who noticed a new wound just above the previous wound on her sacral area and going on more to the left lateral side. She was very concerned and we asked her to get in for an opinion. 03/17/17; above is noted. The patient has developed a progressive area to the left of her original wound. This  seems to this started with a ring of red skin  with a more pale interior almost looking fungal. There was a rim of blister through part of the area although this did not look like zoster. They have been applying triamcinolone that was prescribed last week by [name] and the area has a fold to a linear band area which is confluent, erythematous and with obvious epidermal swelling but there is no overt tenderness or crepitus. She has lost some surface epithelium closer to the wound surface itself and now has a more superficial wound in this area and the extending erythema goes towards the left buttock. This is well demarcated between involved in normal skin but once again does not appear to be at all tender. If there is a contact issue here I cannot get the history out of the daughter or the caregiver that are with her. 03/24/17; the patient arrives today with the wound slightly worse slightly more drainage. The bandlike area of erythema that I treated as a possible pineal infection has improved somewhat although proximally is still has confluent erythema without overt tenderness. We have been using silver alginate since the most recent deterioration. To the bandlike degree of erythema we have been using Lotrisone cream 03/31/17; patient arrives today with the wound slightly larger, necrotic surface and surrounding erythema. The bandlike area of erythema that I treated as a possible pineal infection is less swollen but still present I've been using Lotrisone cream on that largely related to the presence of a tinea looking infection when this was first seen. We've been using silver alginate. X-ray that I ordered last week showed no acute bony abnormalities mild fecal impaction. Lab work showed a comprehensive metabolic panel that was normal including an albumin of 3.8. White count was 9.5 hemoglobin 12.3 differential count normal. C-reactive protein was less than 1 and sedimentation rate and 17. The  latter 2 values does not support an ongoing bacterial infection. 04/14/17; patient arrives after a 2 week hiatus. Her wound is not in good condition. Although the base of the wound looks Tina MainlandSHAW, Dominik W. ([id number]) stable she still has an erythematous area that was apparently blistered over the weekend. This again points to the left. As our intake nurse pointed out today this is in the area where the tissue folds together and we may need to prevent try to prevent this. Lab work and x-ray that I did to 3 weeks ago were unremarkable including her albumin nevertheless she is an extremely frail condition physically. We have have been using [name] 04/22/17; I changed her to silver alginate because of the surrounding maceration and moisture last week. The daughter did not like the way the wound looked in the middle of the week and changed her back to MelletteSantyl. They're putting gauze on top of this. Thinks still using Lotrisone. 05/06/17 on evaluation today patient sacral wound appears to be doing okay and does not seem to be any worse. She is having no significant pain during evaluation today the secondary to mental status she is unable to rate or describe whether she had any pain she was not however flinching. Her daughter states that the wound does appear to be looking better to her. Still we are having difficulty with the skinfold that seems to be closing in on itself at this point. All in all I feel like she is making some good progress in the [name] seems to be the official for her. They do tell me that a refill if we are gonna continue that today. No fevers, chills, nausea,  or vomiting noted at this time. 05/13/17 presents today for evaluation concerning her ongoing sacral pressure ulcer. Unfortunately she also has an area of deep tissue injury in the right Ischial region which is starting to show up. The sacral wound also continues to show signs of necrotic tissue overlying and has declined. Overall we  really have not seen a significant improvement in the past several months in regard to the sacral wound and now patient is starting to develop a new wound in the right Ischial region. Obviously this is not trending in the direction that we want to see. No fevers, chills, nausea, or vomiting noted at this time. 05/19/17; I have not seen this wound and almost a month however there is nothing really positive to say about it. Necrotic tissue over the surface which superiorly I think abuts on her sacrum. She has surrounding erythema. I would be surprised if there is not underlying osteomyelitis or soft tissue infection. 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 She apparently eats well per description although I wonder about this looking at her. Lab work I did probably 4 weeks ago however was really quite normal including a serum albumin 05/26/17; culture I did last week grew Escherichia coli and methicillin sensitive staph aureus which should've been well covered by the Augmentin and ciprofloxacin. Indeed the erythema around the wound in the bed of the wound looks somewhat better. Her intake is still satisfactory. They now have a near fluidized bed 06/02/17; they completed the antibiotics last Friday. Using collagen. Daughter still reports eating and drinking well. There is less visualized erythema around the wound. 06/16/17; large pressure ulcer over her lower sacrum and coccyx. Using [name] to the wound bed. 06/30/17; certainly no change in dimensions of this large stage III wound over her sacrum and coccyx. They've been using [name]. There is no exposed bone. She has a candidal/tinea area in the right inguinal area. Other than that her daughter relates that she is eating and drinking well there changing her positioning to make sure the areas offloaded 07/14/17; no major change in the dimensions of this large stage III wound. Initially a smaller wound that became secondarily infected causing  significant tissue breakdown although it is been stable in the last several weeks. Tunneling superiorly at roughly 1:00 no change here either. There is no bone palpable. Both the patient's daughter and caretaker states that she eats well. I have not rechecked her blood work 07/27/17; patient arrives in clinic today and generally a deteriorated looking state. Mild fever with axillary temperature of 100.4. Daughter reports she has not been eating and drinking well since yesterday. She looks more pale and thin and less responsive. We have been using silver collagen to her wound 08/11/17; since the last time the patient was here things have gone better. Her fever went down and she started eating and drinking again. Culture I did of the wound showed methicillin sensitive staph aureus, Morganella and enterococcus. I only treated her with Keflex which would've not covered the Morganella and enterococcus however the purulent area on the 2:00 side of her wound is a lot better and the bandlike erythema that concern me also was resolved. I'd called the daughter last week to confirm that she was a lot better. She finished the Keflex last Thursday she also suffered a skin tear this morning perhaps while putting on her incontinence brief period is on the lateral aspect of her right leg. Clean wound with the surface epithelium not  viable. 08/25/17; last week the patient was noted to have erythema around the wound margin and a slight fever which the patient's daughter says was [id number]. Our office was contacted by home health however we did not have a space to work the patient in that she went to see her primary physician Dr. Maurice MarchLane. She was not febrile during this visit on 08/21/17 there was erythema around the wound similar to last occasion. Dr. Maurice MarchLane in reference to my culture from 07/28/17 and put her on doxycycline capsules which they're opening twice a day for 10 days. 09/17/17; patient arrives today with the wound  bed looking fairly well granulated. There is undermining from 4 to 6:00 although this seems to of contracted slightly. She does not have obvious infection although the daughter states there was some darkening of the wound circumference that is not evident today. They state she is eating well. They are concerned about oral thrush 09/30/16; very fibrin looking granulated wound bed. Her undermining from 4 to 6:00 is about the same but also appears to be well granulated. She has rolled edges of senescent tissue from roughly 7 to 12:00. There is no evidence of infection Tina MainlandSHAW, Bahja W. ([id number]) 10/13/17 on evaluation today patient appears to be doing fairly well all things considered in regard to her sacral wound. There's really not a lot of significant change or improvement she does have some evidence of contusion and deep tissue injury around the left border of the wound that patient's daughter did inquire about today. Nonetheless overall the wound appears to be doing about the same in my opinion. There is no significant indication of infection there also is no significant slough noted at this point. 10/27/17 She is here in follow up evaluation of a sacral ulcer. She is accompanied by her daughter and caregiver. There is red granulation tissue throughout, persistent discoloration to left border; this appears consistent with deep tissue injury. There are multiple areas covered in foam borders, tegaderm, etc that are "preventative" with "no wounds". We will continue with prisma and continue with two week follow ups 11/17/17 on evaluation today patient appears to be doing decently well in regard to her wound at this point. She continues to have a sacral wound ulcer. We see her roughly every two weeks. In the last week she was actually in the hospital due to what was diagnosed as sepsis although no organisms were ever identified in the actual blood cultures. The hospital really was not sure that the  wound was the cause of the infection but they really did not figure out anything else that would be a causative organism she did have a CT scan and that revealed no evidence of pneumonia there was also no evidence of urinary tract infection. Again it very well could have been the wound called in those although wound appears to be doing fairly well at this point. 12/15/17 on evaluation today patient actually appears to be doing about the same in regard to her sacral ulcer. The one thing different is that she does seem to have a rash where her right arm is contracted and being held to the thorax. The area underlying both on the ventral side of the arm as well is the thorax where it comes in contact shows evidence of a rash which appears to be fungal in nature. There does not appear to be any evidence of infection lies at this point. There does not appear to be a rash consistent with shingles which was  also of concern initially. No fevers, chills, nausea, or vomiting noted at this time. 12/29/17 on evaluation today patient appears to be doing a little better in regard to her sacral wound although the one changes the 12 o'clock location of the wound seems to have attached at one point which no longer allows this to pull back. This has caused an area of undermining that seems to be attaching as well in the 12 o'clock location. I do think that this is something that can be managed and is not necessarily a bad thing. Nonetheless she does seem to have some pain with exploration of this region of undermining. She continues to have an area under her right arm on the chest wall of erythema although this is a little better especially after the home health nurse is actually got in order for Diflucan times one for the patient. She is a new area on the left wrist that looks like because of the contraction her nail may have pushed in on her wrist area causing a slight cut which subsequently became infected. She has  some honey crusted drainage noted. 03/09/18 undervaluation today patient appears to be doing fairly well in regard to her wounds in general. She has been tolerating the dressing changes without complication. Overall I'm pleased with the progress that seems to be made week by week especially in regard to the sacral ulcer. Her daughter and the caregiver seem to take very good care of her. With that being said she has very little undermining in regard to the sacral wound in good granulation she also has good epithelialization noted. 03/23/18 on evaluation today patient appears to be doing rather well in regard to her sacral wound. Unfortunately she does have a little bit of necrotic tissue noted in the central portion of her wound on the left elbow. This is due to the fact that she is lying on this arm seeing how it is contracted. Currently her daughter has started to avoid lying on the side it all due to the fact that the necrotic tissue was noted. With that being said they have been taking very good care of her in my pinion. Fortunately there does not appear to be any evidence of infection which is good news. Overall I'm pleased with the progress she's made other than in regard to the elbow. 04/06/18 on evaluation today patient actually appears to be doing okay in regard to the sacral wound. In fact it appears to be somewhat smaller in general although there does appear to be more undermining at the 6 o'clock location than was previously noted. She has been tolerating the dressing changes without complication in general which is also good news. Nonetheless she does have a new small skin tear/opening on her leg which was evaluated today. Fortunately this appears to be minimal and the Xeroform gauze which has been used up to this point in the past has been utilized very effectively seems to be doing well in that regard. Her left elbow ulcer seems to also be doing excellent at this point making good  progress. 04/20/18 on evaluation today patient appears to be doing fairly well in regard to her sacral wound. This definitely seems to be better than during last evaluation where it was indeed infected. With that being said she has been tolerating the dressing changes as best it can be expected. Her elbow seems to be drying out and a lot of times the dressing is getting stuck according to her caregiver. Esker,  Myles Gip ([id number]) 05/04/18-She is seen in follow-up evaluation for a sacral and left elbow pressure ulcer. These are stable/improved and we will continue with same treatment plan and she will follow-up in 2 weeks 05/18/18 on evaluation today patient actually appears to be doing better in regard to her left elbow ulcer. The sacral wound is also shown signs of improvement which is good news. With that being said she does have a new right medial lower extremity ulcer which actually appears to show some signs of cellulitis/infection. She is previously taken Augmentin with good result. Nonetheless the patient really does not have anything that I can culture at the site there's actually some good epithelialization noted although again there is some cellulitis appearance as well. 06/01/18 on evaluation today patient appears to actually be doing very well in general in regard to her left elbow wound which is smaller in her lower extremity ulcer which is actually healed. With that being said the sacral wound in particular show signs of improvement although she still has undermining in the six-7 o'clock location. No fevers, chills, nausea, or vomiting noted at this time. 06/15/18 on evaluation today patient actually appears to be doing excellent in regard to her elbow ulcer on the left elbow. This is shown signs of great improvement and I do feel like she is very close to healing in this regard. In general her sacral wound also appears to be doing well there's good sign of improvement there as well as  far as the overall surface of the wound is concerned. She does have a significant area of tunneling at the 6 o'clock location that still is about the same really there's no significant improvement in that regard. Nonetheless we are attempting to try and treat this by way of packing with Prisma followed by silver so to help control moisture. The patient been tolerating the dressing changes without complication. 06/29/18 on evaluation today patient actually appears to be doing very well in regard to her left elbow ulcer and her sacral ulcer. She has been tolerating the dressing changes without complication which is good news. In fact the sacral wound appears to have almost completely closed although she does have an area of undermining/tunneling at roughly the 7 o'clock location that is still unfortunately having a more difficult time closing although as opposed to during the last visit I was unable to get a normal Q-tip down into the undermined region. Actually had to use a skinny probe or else the backside of the Q-tip were just a wooden stake was. I think this is a good sign that this area seems to be filling in which is wonderful. Overall I'm pleased with how things are progressing. 07/13/18 on evaluation today patient appears to be doing a little worse in regard to the overall appearance of the sacral wound. She has been tolerating the dressing changes without complication. With that being said the patient's daughter tells me that the home health nurse actually dug around it significantly in the 12 o'clock location of the wound and subsequently reopen this area which was very well healed previous. This was a new nurse that hasn't generally been coming out to see the patient. Nonetheless they are very upset about this and the fact that the wound was actually worse when she was done "taking". It does appear that the wound is recovering and in fact since that point has closed quite significantly  compared to what they tell me it looked like when she was done.  07/27/18 evaluation today patient appears to be doing well in regard to her elbow ulcer as well as the sacral ulcer. She has actually been doing excellent at this point in time. Fortunately there does not appear to be any evidence of infection currently. Overall I'm very happy with the progress at this time. She does have a couple other areas where there was some redness noted a fortunately there does not appear to be the evidence of more significant skin breakdown although there just slightly blanchable regions that I think do need to be watched carefully. 08/10/18 upon evaluation today patient actually appears to be showing signs of improvement at all sites evaluated today. This is even true in the sacral region where she has less expensive undermining although there is still undermining noted unfortunately. They're having a very hard time being able to pack anything into this area. She may benefit from utilizing gentamicin cream and just a cover dressing. Electronic Signature(s) Signed: 08/10/2018 5:50:45 PM By: Lenda Kelp PA-C Entered By: Lenda Kelp on 08/10/2018 13:04:04 [name] ([id number]) -------------------------------------------------------------------------------- Physical Exam Details Patient Name: [name] Date of Service: 08/10/2018 10:00 AM Medical Record Number: [id number] Patient Account Number: [id number] Date of Birth/Sex: 1923/06/26 (84 y.o. F) Treating RN: Curtis Sites Primary Care Provider: Vonita Moss Other Clinician: Referring Provider: Vonita Moss Treating Provider/Extender: STONE III, HOYT Weeks in Treatment: 57 Constitutional Thin and well-hydrated in no acute distress. Respiratory normal breathing without difficulty. clear to auscultation bilaterally. Cardiovascular regular rate and rhythm with normal S1, S2. Psychiatric Patient is not able to cooperate in  decision making regarding care. Patient has dementia. patient is confused. Notes None of the patient's wounds today required sharp debridement at this point. Her elbows completely closed in the sacral wound is the main thing that we are managing at this time. She has several other spots they did have me look at which I did evaluate but nothing appears to be truly open and overall she seems to be doing quite well in my pinion. Electronic Signature(s) Signed: 08/10/2018 5:50:45 PM By: Lenda Kelp PA-C Entered By: Lenda Kelp on 08/10/2018 13:04:42 [name] ([id number]) -------------------------------------------------------------------------------- Physician Orders Details Patient Name: [name] Date of Service: 08/10/2018 10:00 AM Medical Record Number: [id number] Patient Account Number: [id number] Date of Birth/Sex: May 18, 1923 (84 y.o. F) Treating RN: Curtis Sites Primary Care Provider: Vonita Moss Other Clinician: Referring Provider: Vonita Moss Treating Provider/Extender: Linwood Dibbles, HOYT Weeks in Treatment: 59 Verbal / Phone Orders: No Diagnosis Coding ICD-10 Coding Code Description L89.153 Pressure ulcer of sacral region, stage 3 L89.023 Pressure ulcer of left elbow, stage 3 G20 Parkinson's disease L03.312 Cellulitis of back [any part except buttock] S81.811D Laceration without foreign body, right lower leg, subsequent encounter Wound Cleansing Wound #1 Midline Coccyx o Clean wound with Normal Saline. o May Shower, gently pat wound dry prior to applying new dressing. Anesthetic (add to Medication List) Wound #1 Midline Coccyx o Topical Lidocaine 4% cream applied to wound bed prior to debridement (In Clinic Only). Skin Barriers/Peri-Wound Care Wound #1 Midline Coccyx o Skin Prep - use only where the allevyn sticks not on reddened areas o Antifungal powder-Nystatin - around peri-wound on coccyx (do not get in wound), under right breast  and right arm, right abdomen Primary Wound Dressing Wound #1 Midline Coccyx o Gentamicin Sulfate Cream Secondary Dressing Wound #1 Midline Coccyx o Dry Gauze - soft non woven gauze o Boardered Foam Dressing - HHRN PLEASE ORDER ALLEVYN  LIFE BORDERED FOAM FOR PATIENT (OFF BRANDS IRRITATE HER SKIN); ALSO PLEASE COVER ANY PRESSURE THREATENING AREAS WITH ALLEVYN LIFE BORDERED FOAM Dressing Change Frequency Wound #1 Midline Coccyx o Change dressing every day. Follow-up Appointments o Return Appointment in 2 weeks. GABY, HARNEY ([id number]) Off-Loading Wound #1 Midline Coccyx o Roho cushion for wheelchair o Mattress - fluidized air mattress o Turn and reposition every 2 hours Additional Orders / Instructions Wound #1 Midline Coccyx o Increase protein intake. - please add protein supplements to patients diet o Other: - please add vitamin A, vitamin C and zinc supplements to patients diet Home Health Wound #1 Midline Coccyx o Continue Home Health Visits - [facility] o Home Health Nurse may visit PRN to address patientos wound care needs. o FACE TO FACE ENCOUNTER: MEDICARE and MEDICAID PATIENTS: I certify that this patient is under my care and that I had a face-to-face encounter that meets the physician face-to-face encounter requirements with this patient on this date. The encounter with the patient was in whole or in part for the following MEDICAL CONDITION: (primary reason for Home Healthcare) MEDICAL NECESSITY: I certify, that based on my findings, NURSING services are a medically necessary home health service. HOME BOUND STATUS: I certify that my clinical findings support that this patient is homebound (i.e., Due to illness or injury, pt requires aid of supportive devices such as crutches, cane, wheelchairs, walkers, the use of special transportation or the assistance of another person to leave their place of residence. There is a normal inability to leave  the home and doing so requires considerable and taxing effort. Other absences are for medical reasons / religious services and are infrequent or of short duration when for other reasons). o If current dressing causes regression in wound condition, may D/C ordered dressing product/s and apply Normal Saline Moist Dressing daily until next Wound Healing Center / Other MD appointment. Notify Wound Healing Center of regression in wound condition at [phone]. o Please direct any NON-WOUND related issues/requests for orders to patient's Primary Care Physician - Dr Vonita Moss Patient Medications Allergies: Phenergan, Sulfa (Sulfonamide Antibiotics) Notifications Medication Indication Start End gentamicin 08/10/2018 DOSE topical 0.1 % cream - cream topical applied to the affected region with each dressing change as directed Electronic Signature(s) Signed: 08/10/2018 1:06:26 PM By: Lenda Kelp PA-C Previous Signature: 08/10/2018 1:06:12 PM Version By: Lenda Kelp PA-C Entered By: Lenda Kelp on 08/10/2018 13:06:26 [name] ([id number]) -------------------------------------------------------------------------------- Problem List Details Patient Name: [name] Date of Service: 08/10/2018 10:00 AM Medical Record Number: [id number] Patient Account Number: [id number] Date of Birth/Sex: 11-25-1922 (84 y.o. F) Treating RN: Curtis Sites Primary Care Provider: Vonita Moss Other Clinician: Referring Provider: Vonita Moss Treating Provider/Extender: Linwood Dibbles, HOYT Weeks in Treatment: 64 Active Problems ICD-10 Evaluated Encounter Code Description Active Date Today Diagnosis L89.153 Pressure ulcer of sacral region, stage 3 01/07/2017 No Yes L89.023 Pressure ulcer of left elbow, stage 3 03/23/2018 No Yes G20 Parkinson's disease 01/07/2017 No Yes L03.312 Cellulitis of back [any part except buttock] 07/28/2017 No Yes S81.811D Laceration without foreign body,  right lower leg, subsequent 08/11/2017 No Yes encounter Inactive Problems Resolved Problems Electronic Signature(s) Signed: 08/10/2018 5:50:45 PM By: Lenda Kelp PA-C Entered By: Lenda Kelp on 08/10/2018 10:07:00 [name] ([id number]) -------------------------------------------------------------------------------- Progress Note Details Patient Name: [name] Date of Service: 08/10/2018 10:00 AM Medical Record Number: [id number] Patient Account Number: [id number] Date of Birth/Sex: [birth year] (84 y.o. F) Treating RN: Curtis Sites  Primary Care Provider: Vonita Moss Other Clinician: Referring Provider: Vonita Moss Treating Provider/Extender: Linwood Dibbles, HOYT Weeks in Treatment: 106 Subjective Chief Complaint Information obtained from Patient multiple wounds/ulcers History of Present Illness (HPI) 01/07/17 this is a 83 year old woman admitted to the clinic today for review of a pressure ulcer on her lower sacrum. She is referred from her primary physician's office after being seen on 3/22 with a 3 cm pressure area. Her daughter and caretaker accompanied her today state that the area first became obvious about a month ago and his since deteriorated. They have recently got [name] home health involved and have been using [name] to the wound. They have ordered a pressure relief surface for her mattress. They are turning her religiously. They state that she eats well and they've been forcing fluids on her. She is on a multivitamin. Looking through [facility] point last albumin I see was 4.4 on 10/28. The patient has advanced parkinsonism which looks superficially like advanced Parkinson's disease although her daughter tells me she did not ever respond to Sinemet therefore this may have another pathology with signs of parkinsonism. However I think this is largely a mute point currently. She also has dementia and is nonambulatory. Since this started they have  been keeping her in bed and turning her religiously every 2 hours. She lives at home in [place] with her husband with 24/7 care giving 01/13/17 santyl change qd. Still will require further debridement. continue santyl. 01/20/17; patient's wound actually looks some better less adherent necrotic surface. There is actually visible granulation. We're using [name] 01/27/17; better-looking surface but still a lot of necrotic tissue on the base of this wound. The periwound erythema is better than last week we are still using [name]. Her daughter tells [place] that she is still having trouble with the pressure-relief mattress through medical modalities 02/03/18; I had the patient scheduled for a two week followup however her daughter brought her in early concerned for discoloration on 2 areas of the wound circumference. We have bee using santyl 02/10/17;Better looking surface to the wound. Rim appears better suggesting better offloading. Using santyl 02/24/17; change to Silver Collegen last time. Wound appears better. 03/10/17; still using silver collagen religious offloading. Intake is satisfactory per her daughter. Dimension slightly better 03/12/2017 -- Dr. Jannetta Quint patient who had been seen 2 days ago and was doing fairly well. The patient is brought in by her daughter who noticed a new wound just above the previous wound on her sacral area and going on more to the left lateral side. She was very concerned and we asked her to get in for an opinion. 03/17/17; above is noted. The patient has developed a progressive area to the left of her original wound. This seems to this started with a ring of red skin with a more pale interior almost looking fungal. There was a rim of blister through part of the area although this did not look like zoster. They have been applying triamcinolone that was prescribed last week by Dr. Meyer Russel and the area has a fold to a linear band area which is confluent, erythematous and with  obvious epidermal swelling but there is no overt tenderness or crepitus. She has lost some surface epithelium closer to the wound surface itself and now has a more superficial wound in this area and the extending erythema goes towards the left buttock. This is well demarcated between involved in normal skin but once again does not appear to be at all tender.  If there is a contact issue here I cannot get the history out of the daughter or the caregiver that are with her. 03/24/17; the patient arrives today with the wound slightly worse slightly more drainage. The bandlike area of erythema that I treated as a possible pineal infection has improved somewhat although proximally is still has confluent erythema without overt tenderness. We have been using silver alginate since the most recent deterioration. To the bandlike degree of erythema we have been using Lotrisone cream 03/31/17; patient arrives today with the wound slightly larger, necrotic surface and surrounding erythema. The bandlike area of erythema that I treated as a possible pineal infection is less swollen but still present I've been using Lotrisone cream on that largely related to the presence of a tinea looking infection when this was first seen. We've been using silver alginate. ANNALAYA, WILE ([id number]) X-ray that I ordered last week showed no acute bony abnormalities mild fecal impaction. Lab work showed a comprehensive metabolic panel that was normal including an albumin of 3.8. White count was 9.5 hemoglobin 12.3 differential count normal. C-reactive protein was less than 1 and sedimentation rate and 17. The latter 2 values does not support an ongoing bacterial infection. 04/14/17; patient arrives after a 2 week hiatus. Her wound is not in good condition. Although the base of the wound looks stable she still has an erythematous area that was apparently blistered over the weekend. This again points to the left. As our intake nurse  pointed out today this is in the area where the tissue folds together and we may need to prevent try to prevent this. Lab work and x-ray that I did to 3 weeks ago were unremarkable including her albumin nevertheless she is an extremely frail condition physically. We have have been using [name] 04/22/17; I changed her to silver alginate because of the surrounding maceration and moisture last week. The daughter did not like the way the wound looked in the middle of the week and changed her back to [place]. They're putting gauze on top of this. Thinks still using Lotrisone. 05/06/17 on evaluation today patient sacral wound appears to be doing okay and does not seem to be any worse. She is having no significant pain during evaluation today the secondary to mental status she is unable to rate or describe whether she had any pain she was not however flinching. Her daughter states that the wound does appear to be looking better to her. Still we are having difficulty with the skinfold that seems to be closing in on itself at this point. All in all I feel like she is making some good progress in the [name] seems to be the official for her. They do tell me that a refill if we are gonna continue that today. No fevers, chills, nausea, or vomiting noted at this time. 05/13/17 presents today for evaluation concerning her ongoing sacral pressure ulcer. Unfortunately she also has an area of deep tissue injury in the right Ischial region which is starting to show up. The sacral wound also continues to show signs of necrotic tissue overlying and has declined. Overall we really have not seen a significant improvement in the past several months in regard to the sacral wound and now patient is starting to develop a new wound in the right Ischial region. Obviously this is not trending in the direction that we want to see. No fevers, chills, nausea, or vomiting noted at this time. 05/19/17; I have not seen this wound  and almost  a month however there is nothing really positive to say about it. Necrotic tissue over the surface which superiorly I think abuts on her sacrum. She has surrounding erythema. I would be surprised if there is not underlying osteomyelitis or soft tissue infection. 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 She apparently eats well per description although I wonder about this looking at her. Lab work I did probably 4 weeks ago however was really quite normal including a serum albumin 05/26/17; culture I did last week grew Escherichia coli and methicillin sensitive staph aureus which should've been well covered by the Augmentin and ciprofloxacin. Indeed the erythema around the wound in the bed of the wound looks somewhat better. Her intake is still satisfactory. They now have a near fluidized bed 06/02/17; they completed the antibiotics last Friday. Using collagen. Daughter still reports eating and drinking well. There is less visualized erythema around the wound. 06/16/17; large pressure ulcer over her lower sacrum and coccyx. Using [name] to the wound bed. 06/30/17; certainly no change in dimensions of this large stage III wound over her sacrum and coccyx. They've been using [name]. There is no exposed bone. She has a candidal/tinea area in the right inguinal area. Other than that her daughter relates that she is eating and drinking well there changing her positioning to make sure the areas offloaded 07/14/17; no major change in the dimensions of this large stage III wound. Initially a smaller wound that became secondarily infected causing significant tissue breakdown although it is been stable in the last several weeks. Tunneling superiorly at roughly 1:00 no change here either. There is no bone palpable. Both the patient's daughter and caretaker states that she eats well. I have not rechecked her blood work 07/27/17; patient arrives in clinic today and generally a deteriorated looking state.  Mild fever with axillary temperature of 100.4. Daughter reports she has not been eating and drinking well since yesterday. She looks more pale and thin and less responsive. We have been using silver collagen to her wound 08/11/17; since the last time the patient was here things have gone better. Her fever went down and she started eating and drinking again. Culture I did of the wound showed methicillin sensitive staph aureus, Morganella and enterococcus. I only treated her with Keflex which would've not covered the Morganella and enterococcus however the purulent area on the 2:00 side of her wound is a lot better and the bandlike erythema that concern me also was resolved. I'd called the daughter last week to confirm that she was a lot better. She finished the Keflex last Thursday she also suffered a skin tear this morning perhaps while putting on her incontinence brief period is on the lateral aspect of her right leg. Clean wound with the surface epithelium not viable. 08/25/17; last week the patient was noted to have erythema around the wound margin and a slight fever which the patient's daughter says was 3. Our office was contacted by home health however we did not have a space to work the patient in that she went to see her primary physician Dr. Maurice March. She was not febrile during this visit on 08/21/17 there was erythema around the wound similar to last occasion. Dr. Maurice March in reference to my culture from 07/28/17 and put her on doxycycline capsules which they're opening twice a day for 10 days. MITA, VALLO ([id number]) 09/17/17; patient arrives today with the wound bed looking fairly well granulated. There is undermining from  4 to 6:00 although this seems to of contracted slightly. She does not have obvious infection although the daughter states there was some darkening of the wound circumference that is not evident today. They state she is eating well. They are concerned about oral  thrush 09/30/16; very fibrin looking granulated wound bed. Her undermining from 4 to 6:00 is about the same but also appears to be well granulated. She has rolled edges of senescent tissue from roughly 7 to 12:00. There is no evidence of infection 10/13/17 on evaluation today patient appears to be doing fairly well all things considered in regard to her sacral wound. There's really not a lot of significant change or improvement she does have some evidence of contusion and deep tissue injury around the left border of the wound that patient's daughter did inquire about today. Nonetheless overall the wound appears to be doing about the same in my opinion. There is no significant indication of infection there also is no significant slough noted at this point. 10/27/17 She is here in follow up evaluation of a sacral ulcer. She is accompanied by her daughter and caregiver. There is red granulation tissue throughout, persistent discoloration to left border; this appears consistent with deep tissue injury. There are multiple areas covered in foam borders, tegaderm, etc that are "preventative" with "no wounds". We will continue with prisma and continue with two week follow ups 11/17/17 on evaluation today patient appears to be doing decently well in regard to her wound at this point. She continues to have a sacral wound ulcer. We see her roughly every two weeks. In the last week she was actually in the hospital due to what was diagnosed as sepsis although no organisms were ever identified in the actual blood cultures. The hospital really was not sure that the wound was the cause of the infection but they really did not figure out anything else that would be a causative organism she did have a CT scan and that revealed no evidence of pneumonia there was also no evidence of urinary tract infection. Again it very well could have been the wound called in those although wound appears to be doing fairly well at  this point. 12/15/17 on evaluation today patient actually appears to be doing about the same in regard to her sacral ulcer. The one thing different is that she does seem to have a rash where her right arm is contracted and being held to the thorax. The area underlying both on the ventral side of the arm as well is the thorax where it comes in contact shows evidence of a rash which appears to be fungal in nature. There does not appear to be any evidence of infection lies at this point. There does not appear to be a rash consistent with shingles which was also of concern initially. No fevers, chills, nausea, or vomiting noted at this time. 12/29/17 on evaluation today patient appears to be doing a little better in regard to her sacral wound although the one changes the 12 o'clock location of the wound seems to have attached at one point which no longer allows this to pull back. This has caused an area of undermining that seems to be attaching as well in the 12 o'clock location. I do think that this is something that can be managed and is not necessarily a bad thing. Nonetheless she does seem to have some pain with exploration of this region of undermining. She continues to have an area under her  right arm on the chest wall of erythema although this is a little better especially after the home health nurse is actually got in order for Diflucan times one for the patient. She is a new area on the left wrist that looks like because of the contraction her nail may have pushed in on her wrist area causing a slight cut which subsequently became infected. She has some honey crusted drainage noted. 03/09/18 undervaluation today patient appears to be doing fairly well in regard to her wounds in general. She has been tolerating the dressing changes without complication. Overall I'm pleased with the progress that seems to be made week by week especially in regard to the sacral ulcer. Her daughter and the caregiver  seem to take very good care of her. With that being said she has very little undermining in regard to the sacral wound in good granulation she also has good epithelialization noted. 03/23/18 on evaluation today patient appears to be doing rather well in regard to her sacral wound. Unfortunately she does have a little bit of necrotic tissue noted in the central portion of her wound on the left elbow. This is due to the fact that she is lying on this arm seeing how it is contracted. Currently her daughter has started to avoid lying on the side it all due to the fact that the necrotic tissue was noted. With that being said they have been taking very good care of her in my pinion. Fortunately there does not appear to be any evidence of infection which is good news. Overall I'm pleased with the progress she's made other than in regard to the elbow. 04/06/18 on evaluation today patient actually appears to be doing okay in regard to the sacral wound. In fact it appears to be somewhat smaller in general although there does appear to be more undermining at the 6 o'clock location than was previously noted. She has been tolerating the dressing changes without complication in general which is also good news. Nonetheless she does have a new small skin tear/opening on her leg which was evaluated today. Fortunately this appears to be minimal and the Xeroform gauze which has been used up to this point in the past has been utilized very effectively seems LUCRECIA, MCPHEARSON. ([id number]) to be doing well in that regard. Her left elbow ulcer seems to also be doing excellent at this point making good progress. 04/20/18 on evaluation today patient appears to be doing fairly well in regard to her sacral wound. This definitely seems to be better than during last evaluation where it was indeed infected. With that being said she has been tolerating the dressing changes as best it can be expected. Her elbow seems to be drying out  and a lot of times the dressing is getting stuck according to her caregiver. 05/04/18-She is seen in follow-up evaluation for a sacral and left elbow pressure ulcer. These are stable/improved and we will continue with same treatment plan and she will follow-up in 2 weeks 05/18/18 on evaluation today patient actually appears to be doing better in regard to her left elbow ulcer. The sacral wound is also shown signs of improvement which is good news. With that being said she does have a new right medial lower extremity ulcer which actually appears to show some signs of cellulitis/infection. She is previously taken Augmentin with good result. Nonetheless the patient really does not have anything that I can culture at the site there's actually some good epithelialization noted  although again there is some cellulitis appearance as well. 06/01/18 on evaluation today patient appears to actually be doing very well in general in regard to her left elbow wound which is smaller in her lower extremity ulcer which is actually healed. With that being said the sacral wound in particular show signs of improvement although she still has undermining in the six-7 o'clock location. No fevers, chills, nausea, or vomiting noted at this time. 06/15/18 on evaluation today patient actually appears to be doing excellent in regard to her elbow ulcer on the left elbow. This is shown signs of great improvement and I do feel like she is very close to healing in this regard. In general her sacral wound also appears to be doing well there's good sign of improvement there as well as far as the overall surface of the wound is concerned. She does have a significant area of tunneling at the 6 o'clock location that still is about the same really there's no significant improvement in that regard. Nonetheless we are attempting to try and treat this by way of packing with Prisma followed by silver so to help control moisture. The patient been  tolerating the dressing changes without complication. 06/29/18 on evaluation today patient actually appears to be doing very well in regard to her left elbow ulcer and her sacral ulcer. She has been tolerating the dressing changes without complication which is good news. In fact the sacral wound appears to have almost completely closed although she does have an area of undermining/tunneling at roughly the 7 o'clock location that is still unfortunately having a more difficult time closing although as opposed to during the last visit I was unable to get a normal Q-tip down into the undermined region. Actually had to use a skinny probe or else the backside of the Q-tip were just a wooden stake was. I think this is a good sign that this area seems to be filling in which is wonderful. Overall I'm pleased with how things are progressing. 07/13/18 on evaluation today patient appears to be doing a little worse in regard to the overall appearance of the sacral wound. She has been tolerating the dressing changes without complication. With that being said the patient's daughter tells me that the home health nurse actually dug around it significantly in the 12 o'clock location of the wound and subsequently reopen this area which was very well healed previous. This was a new nurse that hasn't generally been coming out to see the patient. Nonetheless they are very upset about this and the fact that the wound was actually worse when she was done "taking". It does appear that the wound is recovering and in fact since that point has closed quite significantly compared to what they tell me it looked like when she was done. 07/27/18 evaluation today patient appears to be doing well in regard to her elbow ulcer as well as the sacral ulcer. She has actually been doing excellent at this point in time. Fortunately there does not appear to be any evidence of infection currently. Overall I'm very happy with the progress at  this time. She does have a couple other areas where there was some redness noted a fortunately there does not appear to be the evidence of more significant skin breakdown although there just slightly blanchable regions that I think do need to be watched carefully. 08/10/18 upon evaluation today patient actually appears to be showing signs of improvement at all sites evaluated today. This is even  true in the sacral region where she has less expensive undermining although there is still undermining noted unfortunately. They're having a very hard time being able to pack anything into this area. She may benefit from utilizing gentamicin cream and just a cover dressing. Patient History Unable to Obtain Patient History due to Altered Mental Status. Information obtained from Patient. LAJUANA, PATCHELL ([id number]) Social History Never smoker, Marital Status - Married, Alcohol Use - Never, Drug Use - No History, Caffeine Use - Never. Medical And Surgical History Notes Ear/Nose/Mouth/Throat dysphagia Neurologic parkinsons, tremor Review of Systems (ROS) Constitutional Symptoms (General Health) Denies complaints or symptoms of Fever, Chills. Respiratory The patient has no complaints or symptoms. Cardiovascular The patient has no complaints or symptoms. Psychiatric The patient has no complaints or symptoms. Objective Constitutional Thin and well-hydrated in no acute distress. Vitals Time Taken: 9:45 AM, Height: 60 in, Weight: 90 lbs, BMI: 17.6, Temperature: 97.6 F, Pulse: 72 bpm, Respiratory Rate: 14 breaths/min, Blood Pressure: 151/110 mmHg. Respiratory normal breathing without difficulty. clear to auscultation bilaterally. Cardiovascular regular rate and rhythm with normal S1, S2. Psychiatric Patient is not able to cooperate in decision making regarding care. Patient has dementia. patient is confused. General Notes: None of the patient's wounds today required sharp debridement at this  point. Her elbows completely closed in the sacral wound is the main thing that we are managing at this time. She has several other spots they did have me look at which I did evaluate but nothing appears to be truly open and overall she seems to be doing quite well in my pinion. Integumentary (Hair, Skin) Wound #1 status is Open. Original cause of wound was Pressure Injury. The wound is located on the Midline Coccyx. The wound measures 0.2cm length x 0.3cm width x 0.2cm depth; 0.047cm^2 area and 0.009cm^3 volume. There is Fat Layer (Subcutaneous Tissue) Exposed exposed. There is no tunneling noted, however, there is undermining starting at 8:00 and ending at 10:00 with a maximum distance of 1.8cm. There is a small amount of serous drainage noted. The wound margin is epibole. There is large (67-100%) red granulation within the wound bed. There is no necrotic tissue within the wound bed. The periwound skin appearance exhibited: Rash. The periwound skin appearance did not exhibit: Callus, Crepitus, Excoriation, Induration, Scarring, Dry/Scaly, Maceration, Atrophie Blanche, Cyanosis, Ecchymosis, Hemosiderin Staining, Mottled, Pallor, Rubor, Erythema. Periwound temperature was noted as No Abnormality. The periwound has tenderness on palpation. AADHIRA, HEFFERNAN ([id number]) Wound #9 status is Healed - Epithelialized. Original cause of wound was Trauma. The wound is located on the Left Elbow. The wound measures 0cm length x 0cm width x 0cm depth; 0cm^2 area and 0cm^3 volume. There is no tunneling or undermining noted. There is a none present amount of drainage noted. The wound margin is indistinct and nonvisible. There is no granulation within the wound bed. There is no necrotic tissue within the wound bed. The periwound skin appearance exhibited: Dry/Scaly. The periwound skin appearance did not exhibit: Callus, Crepitus, Excoriation, Induration, Rash, Scarring, Maceration, Atrophie Blanche, Cyanosis,  Ecchymosis, Hemosiderin Staining, Mottled, Pallor, Rubor, Erythema. Assessment Active Problems ICD-10 Pressure ulcer of sacral region, stage 3 Pressure ulcer of left elbow, stage 3 Parkinson's disease Cellulitis of back [any part except buttock] Laceration without foreign body, right lower leg, subsequent encounter Plan Wound Cleansing: Wound #1 Midline Coccyx: Clean wound with Normal Saline. May Shower, gently pat wound dry prior to applying new dressing. Anesthetic (add to Medication List): Wound #1 Midline Coccyx: Topical  Lidocaine 4% cream applied to wound bed prior to debridement (In Clinic Only). Skin Barriers/Peri-Wound Care: Wound #1 Midline Coccyx: Skin Prep - use only where the allevyn sticks not on reddened areas Antifungal powder-Nystatin - around peri-wound on coccyx (do not get in wound), under right breast and right arm, right abdomen Primary Wound Dressing: Wound #1 Midline Coccyx: Gentamicin Sulfate Cream Secondary Dressing: Wound #1 Midline Coccyx: Dry Gauze - soft non woven gauze Boardered Foam Dressing - HHRN PLEASE ORDER ALLEVYN LIFE BORDERED FOAM FOR PATIENT (OFF BRANDS IRRITATE HER SKIN); ALSO PLEASE COVER ANY PRESSURE THREATENING AREAS WITH ALLEVYN LIFE BORDERED FOAM Dressing Change Frequency: Wound #1 Midline Coccyx: Change dressing every day. Follow-up Appointments: Return Appointment in 2 weeks. Off-Loading: Wound #1 Midline Coccyx: Roho cushion for wheelchair ASHTIN, MELICHAR. ([id number]) Mattress - fluidized air mattress Turn and reposition every 2 hours Additional Orders / Instructions: Wound #1 Midline Coccyx: Increase protein intake. - please add protein supplements to patients diet Other: - please add vitamin A, vitamin C and zinc supplements to patients diet Home Health: Wound #1 Midline Coccyx: Continue Home Health Visits - [facility] Nurse may visit PRN to address patient s wound care needs. FACE TO FACE ENCOUNTER:  MEDICARE and MEDICAID PATIENTS: I certify that this patient is under my care and that I had a face-to-face encounter that meets the physician face-to-face encounter requirements with this patient on this date. The encounter with the patient was in whole or in part for the following MEDICAL CONDITION: (primary reason for Home Healthcare) MEDICAL NECESSITY: I certify, that based on my findings, NURSING services are a medically necessary home health service. HOME BOUND STATUS: I certify that my clinical findings support that this patient is homebound (i.e., Due to illness or injury, pt requires aid of supportive devices such as crutches, cane, wheelchairs, walkers, the use of special transportation or the assistance of another person to leave their place of residence. There is a normal inability to leave the home and doing so requires considerable and taxing effort. Other absences are for medical reasons / religious services and are infrequent or of short duration when for other reasons). If current dressing causes regression in wound condition, may D/C ordered dressing product/s and apply Normal Saline Moist Dressing daily until next Wound Healing Center / Other MD appointment. Notify Wound Healing Center of regression in wound condition at [phone]. Please direct any NON-WOUND related issues/requests for orders to patient's Primary Care Physician - Dr Vonita Moss The following medication(s) was prescribed: gentamicin topical 0.1 % cream cream topical applied to the affected region with each dressing change as directed starting 08/10/2018 My suggestion at this time is going to be that we go ahead and continue wound with the above wound care measures for the next week. I will switch to the gentamicin and we're just gonna use a cover dressing over the sacral region as it's difficult for them to pack anything into this area anyway. This can also be used on the area of the left temple where the  patient does have an area that may show some signs of infection at this time. Other than this I believe that we will continue to see how a two week follow-up schedule see were things stand. Please see above for specific wound care orders. We will see patient for re-evaluation in 2 week(s) here in the clinic. If anything worsens or changes patient will contact our office for additional recommendations. Electronic Signature(s) Signed: 08/10/2018 5:50:45 PM By:  Linwood Dibbles, Hoyt PA-C Entered By: Lenda Kelp on 08/10/2018 13:06:34 [name] ([id number]) -------------------------------------------------------------------------------- ROS/PFSH Details Patient Name: [name] Date of Service: 08/10/2018 10:00 AM Medical Record Number: [id number] Patient Account Number: [id number] Date of Birth/Sex: [birth year] (84 y.o. F) Treating RN: Curtis Sites Primary Care Provider: Vonita Moss Other Clinician: Referring Provider: Vonita Moss Treating Provider/Extender: Linwood Dibbles, HOYT Weeks in Treatment: 10 Unable to Obtain Patient History due to oo Altered Mental Status Information Obtained From Patient Wound History Do you currently have one or more open woundso Yes How many open wounds do you currently haveo 1 Approximately how long have you had your woundso 1 week How have you been treating your wound(s) until nowo santyl and gauze Has your wound(s) ever healed and then re-openedo No Have you had any lab work done in the past montho No Have you tested positive for an antibiotic resistant organism (MRSA, VRE)o No Have you tested positive for osteomyelitis (bone infection)o No Have you had any tests for circulation on your legso No Constitutional Symptoms (General Health) Complaints and Symptoms: Negative for: Fever; Chills Eyes Medical History: Negative for: Cataracts; Glaucoma; Optic Neuritis Ear/Nose/Mouth/Throat Medical History: Negative for: Chronic sinus  problems/congestion; Middle ear problems Past Medical History Notes: dysphagia Hematologic/Lymphatic Medical History: Positive for: Anemia Negative for: Hemophilia; Human Immunodeficiency Virus; Lymphedema; Sickle Cell Disease Respiratory Complaints and Symptoms: No Complaints or Symptoms Medical History: Negative for: Aspiration; Asthma; Chronic Obstructive Pulmonary Disease (COPD); Pneumothorax; Sleep Apnea; Tuberculosis Cardiovascular BRYER, GOTTSCH. ([id number]) Complaints and Symptoms: No Complaints or Symptoms Medical History: Positive for: Hypertension Negative for: Angina; Arrhythmia; Congestive Heart Failure; Coronary Artery Disease; Deep Vein Thrombosis; Hypotension; Myocardial Infarction; Peripheral Arterial Disease; Peripheral Venous Disease; Phlebitis; Vasculitis Gastrointestinal Medical History: Negative for: Cirrhosis ; Colitis; Crohnos; Hepatitis A; Hepatitis B; Hepatitis C Endocrine Medical History: Negative for: Type I Diabetes; Type II Diabetes Genitourinary Medical History: Negative for: End Stage Renal Disease Immunological Medical History: Negative for: Lupus Erythematosus; Raynaudos; Scleroderma Integumentary (Skin) Medical History: Negative for: History of Burn; History of pressure wounds Musculoskeletal Medical History: Negative for: Gout; Rheumatoid Arthritis; Osteoarthritis; Osteomyelitis Neurologic Medical History: Positive for: Dementia Negative for: Neuropathy; Quadriplegia; Paraplegia; Seizure Disorder Past Medical History Notes: parkinsons, tremor Oncologic Medical History: Negative for: Received Chemotherapy; Received Radiation Psychiatric Complaints and Symptoms: No Complaints or Symptoms Medical History: Negative for: Anorexia/bulimia; Confinement Anxiety KHAYA, THEISSEN ([id number]) Immunizations Pneumococcal Vaccine: Received Pneumococcal Vaccination: Yes Immunization Notes: up to date Implantable Devices Family and  Social History Never smoker; Marital Status - Married; Alcohol Use: Never; Drug Use: No History; Caffeine Use: Never; Financial Concerns: No; Food, Clothing or Shelter Needs: No; Support System Lacking: No; Transportation Concerns: No; Advanced Directives: Yes (Not Provided); Patient does not want information on Advanced Directives; Medical Power of Attorney: Yes - dtr (Not Provided) Physician Affirmation I have reviewed and agree with the above information. Electronic Signature(s) Signed: 08/10/2018 5:03:03 PM By: Curtis Sites Signed: 08/10/2018 5:50:45 PM By: Lenda Kelp PA-C Entered By: Lenda Kelp on 08/10/2018 13:04:25 [name] ([id number]) -------------------------------------------------------------------------------- SuperBill Details Patient Name: [name] Date of Service: 08/10/2018 Medical Record Number: [id number] Patient Account Number: [id number] Date of Birth/Sex: 09/11/1923 (84 y.o. F) Treating RN: Curtis Sites Primary Care Provider: Vonita Moss Other Clinician: Referring Provider: Vonita Moss Treating Provider/Extender: Linwood Dibbles, HOYT Weeks in Treatment: 22 Diagnosis Coding ICD-10 Codes Code Description L89.153 Pressure ulcer of sacral region, stage 3 L89.023 Pressure ulcer of left elbow, stage 3 G20 Parkinson's  disease L03.312 Cellulitis of back [any part except buttock] S81.811D Laceration without foreign body, right lower leg, subsequent encounter Facility Procedures CPT4 Code: 16109604 Description: 99213 - WOUND CARE VISIT-LEV 3 EST PT Modifier: Quantity: 1 Physician Procedures CPT4 Code: 5409811 Description: 99214 - WC PHYS LEVEL 4 - EST PT ICD-10 Diagnosis Description L89.153 Pressure ulcer of sacral region, stage 3 L89.023 Pressure ulcer of left elbow, stage 3 G20 Parkinson's disease L03.312 Cellulitis of back [any part except buttock] Modifier: Quantity: 1 Electronic Signature(s) Signed: 08/10/2018 5:50:45 PM By:  Lenda Kelp PA-C Entered By: Lenda Kelp on 08/10/2018 13:06:48

## 2018-08-12 NOTE — Progress Notes (Signed)
Tina, [name] ([id number]) Visit Report for 08/10/2018 Arrival Information Details Patient Name: Tina, [name] Date of Service: 08/10/2018 10:00 AM Medical Record Number: [id number] Patient Account Number: [id number] Date of Birth/Sex: 10-27-1922 (84 y.o. F) Treating RN: Curtis Sites Primary Care Kingstin Heims: Vonita Moss Other Clinician: Referring Hasnain Manheim: Vonita Moss Treating Khaleah Duer/Extender: Linwood Dibbles, HOYT Weeks in Treatment: 83 Visit Information History Since Last Visit Added or deleted any medications: No Patient Arrived: Wheel Chair Any new allergies or adverse reactions: No Arrival Time: 09:41 Had a fall or experienced change in No Accompanied By: daughter and activities of daily living that may affect caregiver risk of falls: Transfer Assistance: Manual Signs or symptoms of abuse/neglect since last visito No Patient Identification Verified: Yes Hospitalized since last visit: No Secondary Verification Process Yes Implantable device outside of the clinic excluding No Completed: cellular tissue based products placed in the center Patient Requires Transmission-Based No since last visit: Precautions: Has Dressing in Place as Prescribed: Yes Patient Has Alerts: No Pain Present Now: No Electronic Signature(s) Signed: 08/10/2018 2:49:09 PM By: Dayton Martes RCP, RRT, CHT Entered By: Dayton Martes on 08/10/2018 09:42:12 [name] ([id number]) -------------------------------------------------------------------------------- Clinic Level of Care Assessment Details Patient Name: [name] Date of Service: 08/10/2018 10:00 AM Medical Record Number: [id number] Patient Account Number: [id number] Date of Birth/Sex: [birth year] (84 y.o. F) Treating RN: Curtis Sites Primary Care Ki Corbo: Vonita Moss Other Clinician: Referring Garner Dullea: Vonita Moss Treating Cydnee Fuquay/Extender: Linwood Dibbles, HOYT Weeks in Treatment:  64 Clinic Level of Care Assessment Items TOOL 4 Quantity Score []  - Use when only an EandM is performed on FOLLOW-UP visit 0 ASSESSMENTS - Nursing Assessment / Reassessment X - Reassessment of Co-morbidities (includes updates in patient status) 1 10 X- 1 5 Reassessment of Adherence to Treatment Plan ASSESSMENTS - Wound and Skin Assessment / Reassessment []  - Simple Wound Assessment / Reassessment - one wound 0 X- 2 5 Complex Wound Assessment / Reassessment - multiple wounds X- 1 10 Dermatologic / Skin Assessment (not related to wound area) ASSESSMENTS - Focused Assessment []  - Circumferential Edema Measurements - multi extremities 0 []  - 0 Nutritional Assessment / Counseling / Intervention []  - 0 Lower Extremity Assessment (monofilament, tuning fork, pulses) []  - 0 Peripheral Arterial Disease Assessment (using hand held doppler) ASSESSMENTS - Ostomy and/or Continence Assessment and Care []  - Incontinence Assessment and Management 0 []  - 0 Ostomy Care Assessment and Management (repouching, etc.) PROCESS - Coordination of Care X - Simple Patient / Family Education for ongoing care 1 15 []  - 0 Complex (extensive) Patient / Family Education for ongoing care []  - 0 Staff obtains Chiropractor, Records, Test Results / Process Orders []  - 0 Staff telephones HHA, Nursing Homes / Clarify orders / etc []  - 0 Routine Transfer to another Facility (non-emergent condition) []  - 0 Routine Hospital Admission (non-emergent condition) []  - 0 New Admissions / Manufacturing engineer / Ordering NPWT, Apligraf, etc. []  - 0 Emergency Hospital Admission (emergent condition) X- 1 10 Simple Discharge Coordination [name], [name]. ([id number]) []  - 0 Complex (extensive) Discharge Coordination PROCESS - Special Needs []  - Pediatric / Minor Patient Management 0 []  - 0 Isolation Patient Management []  - 0 Hearing / Language / Visual special needs []  - 0 Assessment of Community assistance  (transportation, D/C planning, etc.) []  - 0 Additional assistance / Altered mentation []  - 0 Support Surface(s) Assessment (bed, cushion, seat, etc.) INTERVENTIONS - Wound Cleansing / Measurement []  - Simple Wound Cleansing - one wound  0 X- 2 5 Complex Wound Cleansing - multiple wounds X- 1 5 Wound Imaging (photographs - any number of wounds) []  - 0 Wound Tracing (instead of photographs) []  - 0 Simple Wound Measurement - one wound X- 2 5 Complex Wound Measurement - multiple wounds INTERVENTIONS - Wound Dressings X - Small Wound Dressing one or multiple wounds 1 10 []  - 0 Medium Wound Dressing one or multiple wounds []  - 0 Large Wound Dressing one or multiple wounds X- 1 5 Application of Medications - topical []  - 0 Application of Medications - injection INTERVENTIONS - Miscellaneous []  - External ear exam 0 []  - 0 Specimen Collection (cultures, biopsies, blood, body fluids, etc.) []  - 0 Specimen(s) / Culture(s) sent or taken to Lab for analysis []  - 0 Patient Transfer (multiple staff / Nurse, adult / Similar devices) []  - 0 Simple Staple / Suture removal (25 or less) []  - 0 Complex Staple / Suture removal (26 or more) []  - 0 Hypo / Hyperglycemic Management (close monitor of Blood Glucose) []  - 0 Ankle / Brachial Index (ABI) - do not check if billed separately X- 1 5 Vital Signs [name], [name] ([id number]) Has the patient been seen at the hospital within the last three years: Yes Total Score: 105 Level Of Care: New/Established - Level 3 Electronic Signature(s) Signed: 08/10/2018 5:03:03 PM By: Curtis Sites Entered By: Curtis Sites on 08/10/2018 10:37:48 [name] ([id number]) -------------------------------------------------------------------------------- Encounter Discharge Information Details Patient Name: [name] Date of Service: 08/10/2018 10:00 AM Medical Record Number: [id number] Patient Account Number: [id number] Date of Birth/Sex:  Dec 11, 1922 (84 y.o. F) Treating RN: Curtis Sites Primary Care Afsana Liera: Vonita Moss Other Clinician: Referring Chelsey Redondo: Vonita Moss Treating Leonela Kivi/Extender: Linwood Dibbles, HOYT Weeks in Treatment: 94 Encounter Discharge Information Items Discharge Condition: Stable Ambulatory Status: Wheelchair Discharge Destination: Home Transportation: Private Auto Accompanied By: daughter and caregiver Schedule Follow-up Appointment: Yes Clinical Summary of Care: Electronic Signature(s) Signed: 08/10/2018 5:03:03 PM By: Curtis Sites Entered By: Curtis Sites on 08/10/2018 10:42:18 [name] ([id number]) -------------------------------------------------------------------------------- Lower Extremity Assessment Details Patient Name: [name] Date of Service: 08/10/2018 10:00 AM Medical Record Number: [id number] Patient Account Number: [id number] Date of Birth/Sex: 1923/04/23 (84 y.o. F) Treating RN: Huel Coventry Primary Care Suzzanne Brunkhorst: Vonita Moss Other Clinician: Referring Beverely Suen: Vonita Moss Treating Griselda Bramblett/Extender: Skeet Simmer in Treatment: 6 Electronic Signature(s) Signed: 08/10/2018 4:48:54 PM By: Elliot Gurney, BSN, RN, CWS, Kim RN, BSN Entered By: Elliot Gurney, BSN, RN, CWS, Kim on 08/10/2018 10:12:19 [name] ([id number]) -------------------------------------------------------------------------------- Multi Wound Chart Details Patient Name: [name] Date of Service: 08/10/2018 10:00 AM Medical Record Number: [id number] Patient Account Number: [id number] Date of Birth/Sex: 09-Oct-1922 (84 y.o. F) Treating RN: Curtis Sites Primary Care Kylea Berrong: Vonita Moss Other Clinician: Referring Destenie Ingber: Vonita Moss Treating Bridgid Printz/Extender: Linwood Dibbles, HOYT Weeks in Treatment: 82 Vital Signs Height(in): 60 Pulse(bpm): 72 Weight(lbs): 90 Blood Pressure(mmHg): 151/110 Body Mass Index(BMI): 18 Temperature(F): 97.6 Respiratory  Rate 14 (breaths/min): Photos: [1:No Photos] [9:No Photos] [N/A:N/A] Wound Location: [1:Coccyx - Midline] [9:Left Elbow] [N/A:N/A] Wounding Event: [1:Pressure Injury] [9:Trauma] [N/A:N/A] Primary Etiology: [1:Pressure Ulcer] [9:Trauma, Other] [N/A:N/A] Comorbid History: [1:Anemia, Hypertension, Dementia] [9:Anemia, Hypertension, Dementia] [N/A:N/A] Date Acquired: [1:12/01/2016] [9:02/16/2018] [N/A:N/A] Weeks of Treatment: [1:82] [9:24] [N/A:N/A] Wound Status: [1:Open] [9:Open] [N/A:N/A] Measurements L x W x D [1:0.2x0.3x0.2] [9:0.1x0.1x0.1] [N/A:N/A] (cm) Area (cm) : [1:0.047] [9:0.008] [N/A:N/A] Volume (cm) : [1:0.009] [9:0.001] [N/A:N/A] % Reduction in Area: [1:98.90%] [9:99.30%] [N/A:N/A] % Reduction in Volume: [1:99.50%] [9:99.20%] [N/A:N/A] Starting  Position 1 [1:8] (o'clock): Ending Position 1 [1:10] (o'clock): Maximum Distance 1 (cm): [1:1.8] Undermining: [1:Yes] [9:No] [N/A:N/A] Classification: [1:Category/Stage IV] [9:Partial Thickness] [N/A:N/A] Exudate Amount: [1:Small] [9:None Present] [N/A:N/A] Exudate Type: [1:Serous] [9:N/A] [N/A:N/A] Exudate Color: [1:amber] [9:N/A] [N/A:N/A] Wound Margin: [1:Epibole] [9:Indistinct, nonvisible] [N/A:N/A] Granulation Amount: [1:Large (67-100%)] [9:None Present (0%)] [N/A:N/A] Granulation Quality: [1:Red] [9:N/A] [N/A:N/A] Necrotic Amount: [1:None Present (0%)] [9:None Present (0%)] [N/A:N/A] Exposed Structures: [1:Fat Layer (Subcutaneous Tissue) Exposed: Yes Fascia: No Tendon: No Muscle: No Joint: No Bone: No] [9:Fascia: No Fat Layer (Subcutaneous Tissue) Exposed: No Tendon: No Muscle: No Joint: No Bone: No] [N/A:N/A] Epithelialization: [1:None] [9:Large (67-100%)] [N/A:N/A] Periwound Skin Texture: Rash: Yes Excoriation: No N/A Excoriation: No Induration: No Induration: No Callus: No Callus: No Crepitus: No Crepitus: No Rash: No Scarring: No Scarring: No Periwound Skin Moisture: Maceration: No Dry/Scaly: Yes  N/A Dry/Scaly: No Maceration: No Periwound Skin Color: Atrophie Blanche: No Atrophie Blanche: No N/A Cyanosis: No Cyanosis: No Ecchymosis: No Ecchymosis: No Erythema: No Erythema: No Hemosiderin Staining: No Hemosiderin Staining: No Mottled: No Mottled: No Pallor: No Pallor: No Rubor: No Rubor: No Temperature: No Abnormality N/A N/A Tenderness on Palpation: Yes No N/A Wound Preparation: Ulcer Cleansing: Topical Anesthetic Applied: N/A Rinsed/Irrigated with Saline None Topical Anesthetic Applied: None Treatment Notes Electronic Signature(s) Signed: 08/10/2018 5:03:03 PM By: Curtis Sitesorthy, Joanna Entered By: Curtis Sitesorthy, Joanna on 08/10/2018 10:30:45 [name], [name] ([id number]) -------------------------------------------------------------------------------- Multi-Disciplinary Care Plan Details Patient Name: [name], [name] Date of Service: 08/10/2018 10:00 AM Medical Record Number: [id number] Patient Account Number: [id number] Date of Birth/Sex: 12/29/1922 (84 y.o. F) Treating RN: Curtis Sitesorthy, Joanna Primary Care Alba Perillo: Vonita MossRISSMAN, MARK Other Clinician: Referring Anndee Connett: Vonita MossRISSMAN, MARK Treating Imani Sherrin/Extender: Linwood DibblesSTONE III, HOYT Weeks in Treatment: 4682 Active Inactive ` Abuse / Safety / Falls / Self Care Management Nursing Diagnoses: Impaired physical mobility Potential for falls Goals: Patient will remain injury free Date Initiated: 01/07/2017 Target Resolution Date: 04/03/2017 Goal Status: Active Interventions: Assess fall risk on admission and as needed Notes: ` Nutrition Nursing Diagnoses: Potential for alteratiion in Nutrition/Potential for imbalanced nutrition Goals: Patient/caregiver agrees to and verbalizes understanding of need to use nutritional supplements and/or vitamins as prescribed Date Initiated: 01/07/2017 Target Resolution Date: 04/03/2017 Goal Status: Active Interventions: Assess patient nutrition upon admission and as needed per  policy Notes: ` Orientation to the Wound Care Program Nursing Diagnoses: Knowledge deficit related to the wound healing center program Goals: Patient/caregiver will verbalize understanding of the Wound Healing Center Program Date Initiated: 01/07/2017 Target Resolution Date: 04/03/2017 Goal Status: Active [name], Dayzee W. ([id number]) Interventions: Provide education on orientation to the wound center Notes: ` Pressure Nursing Diagnoses: Knowledge deficit related to causes and risk factors for pressure ulcer development Goals: Patient will remain free from development of additional pressure ulcers Date Initiated: 01/07/2017 Target Resolution Date: 04/03/2017 Goal Status: Active Interventions: Assess potential for pressure ulcer upon admission and as needed Notes: ` Wound/Skin Impairment Nursing Diagnoses: Impaired tissue integrity Goals: Patient/caregiver will verbalize understanding of skin care regimen Date Initiated: 01/07/2017 Target Resolution Date: 04/03/2017 Goal Status: Active Ulcer/skin breakdown will have a volume reduction of 30% by week 4 Date Initiated: 01/07/2017 Target Resolution Date: 04/03/2017 Goal Status: Active Ulcer/skin breakdown will have a volume reduction of 50% by week 8 Date Initiated: 01/07/2017 Target Resolution Date: 04/03/2017 Goal Status: Active Ulcer/skin breakdown will have a volume reduction of 80% by week 12 Date Initiated: 01/07/2017 Target Resolution Date: 04/03/2017 Goal Status: Active Ulcer/skin breakdown will heal within 14 weeks Date Initiated: 01/07/2017 Target Resolution Date: 04/03/2017  Goal Status: Active Interventions: Assess patient/caregiver ability to obtain necessary supplies Assess patient/caregiver ability to perform ulcer/skin care regimen upon admission and as needed Assess ulceration(s) every visit Notes: Electronic Signature(s) [name], [name] ([id number]) Signed: 08/10/2018 5:03:03 PM By: Curtis Sites Entered By:  Curtis Sites on 08/10/2018 10:30:37 [name] ([id number]) -------------------------------------------------------------------------------- Pain Assessment Details Patient Name: [name] Date of Service: 08/10/2018 10:00 AM Medical Record Number: [id number] Patient Account Number: [id number] Date of Birth/Sex: November 22, 1922 (84 y.o. F) Treating RN: Curtis Sites Primary Care Nikala Walsworth: Vonita Moss Other Clinician: Referring Editha Bridgeforth: Vonita Moss Treating Brendyn Mclaren/Extender: Linwood Dibbles, HOYT Weeks in Treatment: 54 Active Problems Location of Pain Severity and Description of Pain Patient Has Paino Patient Unable to Respond Site Locations Pain Management and Medication Current Pain Management: Electronic Signature(s) Signed: 08/10/2018 2:49:09 PM By: Dayton Martes RCP, RRT, CHT Signed: 08/10/2018 5:03:03 PM By: Curtis Sites Entered By: Dayton Martes on 08/10/2018 09:42:22 [name] ([id number]) -------------------------------------------------------------------------------- Patient/Caregiver Education Details Patient Name: [name] Date of Service: 08/10/2018 10:00 AM Medical Record Number: [id number] Patient Account Number: [id number] Date of Birth/Gender: 1923/08/05 (84 y.o. F) Treating RN: Curtis Sites Primary Care Physician: Vonita Moss Other Clinician: Referring Physician: Vonita Moss Treating Physician/Extender: Skeet Simmer in Treatment: 68 Education Assessment Education Provided To: Caregiver Education Topics Provided Wound/Skin Impairment: Handouts: Other: wound care as ordered Methods: Demonstration, Explain/Verbal Responses: State content correctly Electronic Signature(s) Signed: 08/10/2018 5:03:03 PM By: Curtis Sites Entered By: Curtis Sites on 08/10/2018 10:38:12 [name]  ([id number]) -------------------------------------------------------------------------------- Wound Assessment Details Patient Name: [name] Date of Service: 08/10/2018 10:00 AM Medical Record Number: [id number] Patient Account Number: [id number] Date of Birth/Sex: 05-30-1923 (84 y.o. F) Treating RN: Huel Coventry Primary Care Kimberly Coye: Vonita Moss Other Clinician: Referring Teyonna Plaisted: Vonita Moss Treating Janayia Burggraf/Extender: Linwood Dibbles, HOYT Weeks in Treatment: 82 Wound Status Wound Number: 1 Primary Etiology: Pressure Ulcer Wound Location: Coccyx - Midline Wound Status: Open Wounding Event: Pressure Injury Comorbid History: Anemia, Hypertension, Dementia Date Acquired: 12/01/2016 Weeks Of Treatment: 82 Clustered Wound: No Wound Measurements Length: (cm) 0.2 Width: (cm) 0.3 Depth: (cm) 0.2 Area: (cm) 0.047 Volume: (cm) 0.009 % Reduction in Area: 98.9% % Reduction in Volume: 99.5% Epithelialization: None Tunneling: No Undermining: Yes Starting Position (o'clock): 8 Ending Position (o'clock): 10 Maximum Distance: (cm) 1.8 Wound Description Classification: Category/Stage IV Wound Margin: Epibole Exudate Amount: Small Exudate Type: Serous Exudate Color: amber Foul Odor After Cleansing: No Slough/Fibrino Yes Wound Bed Granulation Amount: Large (67-100%) Exposed Structure Granulation Quality: Red Fascia Exposed: No Necrotic Amount: None Present (0%) Fat Layer (Subcutaneous Tissue) Exposed: Yes Tendon Exposed: No Muscle Exposed: No Joint Exposed: No Bone Exposed: No Periwound Skin Texture Texture Color No Abnormalities Noted: No No Abnormalities Noted: No Callus: No Atrophie Blanche: No Crepitus: No Cyanosis: No Excoriation: No Ecchymosis: No Induration: No Erythema: No Rash: Yes Hemosiderin Staining: No Scarring: No Mottled: No Pallor: No Moisture [name], [name] ([id number]) No Abnormalities Noted: No Rubor: No Dry / Scaly: No  Temperature / Pain Maceration: No Temperature: No Abnormality Tenderness on Palpation: Yes Wound Preparation Ulcer Cleansing: Rinsed/Irrigated with Saline Topical Anesthetic Applied: None Treatment Notes Wound #1 (Midline Coccyx) Notes gentamycin cream, gauze and BFD Electronic Signature(s) Signed: 08/10/2018 4:48:54 PM By: Elliot Gurney, BSN, RN, CWS, Kim RN, BSN Entered By: Elliot Gurney, BSN, RN, CWS, Kim on 08/10/2018 10:10:19 [name] ([id number]) -------------------------------------------------------------------------------- Wound Assessment Details Patient Name: [name] Date of Service: 08/10/2018 10:00 AM Medical Record Number: [id number]  Patient Account Number: [id number] Date of Birth/Sex: 16-Feb-1923 (84 y.o. F) Treating RN: Curtis Sites Primary Care Ajai Terhaar: Vonita Moss Other Clinician: Referring Yuka Lallier: Vonita Moss Treating Samual Beals/Extender: Linwood Dibbles, HOYT Weeks in Treatment: 82 Wound Status Wound Number: 9 Primary Etiology: Trauma, Other Wound Location: Left Elbow Wound Status: Healed - Epithelialized Wounding Event: Trauma Comorbid History: Anemia, Hypertension, Dementia Date Acquired: 02/16/2018 Weeks Of Treatment: 24 Clustered Wound: No Wound Measurements Length: (cm) 0 % R Width: (cm) 0 % R Depth: (cm) 0 Epi Area: (cm) 0 Tu Volume: (cm) 0 Un eduction in Area: 100% eduction in Volume: 100% thelialization: Large (67-100%) nneling: No dermining: No Wound Description Classification: Partial Thickness Wound Margin: Indistinct, nonvisible Exudate Amount: None Present Foul Odor After Cleansing: No Slough/Fibrino No Wound Bed Granulation Amount: None Present (0%) Exposed Structure Necrotic Amount: None Present (0%) Fascia Exposed: No Fat Layer (Subcutaneous Tissue) Exposed: No Tendon Exposed: No Muscle Exposed: No Joint Exposed: No Bone Exposed: No Periwound Skin Texture Texture Color No Abnormalities Noted: No No  Abnormalities Noted: No Callus: No Atrophie Blanche: No Crepitus: No Cyanosis: No Excoriation: No Ecchymosis: No Induration: No Erythema: No Rash: No Hemosiderin Staining: No Scarring: No Mottled: No Pallor: No Moisture Rubor: No No Abnormalities Noted: No Dry / Scaly: Yes Maceration: No Wound Preparation Topical Anesthetic Applied: None [name], [name] ([id number]) Electronic Signature(s) Signed: 08/10/2018 5:03:03 PM By: Curtis Sites Entered By: Curtis Sites on 08/10/2018 10:35:25 [name] ([id number]) -------------------------------------------------------------------------------- Vitals Details Patient Name: [name] Date of Service: 08/10/2018 10:00 AM Medical Record Number: [id number] Patient Account Number: [id number] Date of Birth/Sex: 03-13-1923 (84 y.o. F) Treating RN: Curtis Sites Primary Care Nafis Farnan: Vonita Moss Other Clinician: Referring Brieana Shimmin: Vonita Moss Treating Kobyn Kray/Extender: Linwood Dibbles, HOYT Weeks in Treatment: 86 Vital Signs Time Taken: 09:45 Temperature (F): 97.6 Height (in): 60 Pulse (bpm): 72 Weight (lbs): 90 Respiratory Rate (breaths/min): 14 Body Mass Index (BMI): 17.6 Blood Pressure (mmHg): 151/110 Reference Range: 80 - 120 mg / dl Electronic Signature(s) Signed: 08/10/2018 2:49:09 PM By: Dayton Martes RCP, RRT, CHT Entered By: Weyman Rodney, Lucio Edward on 08/10/2018 10:20:22

## 2018-08-24 ENCOUNTER — Encounter: Payer: Medicare Other | Admitting: Physician Assistant

## 2018-08-24 DIAGNOSIS — L03312 Cellulitis of back [any part except buttock]: Secondary | ICD-10-CM | POA: Diagnosis not present

## 2018-08-26 NOTE — Progress Notes (Signed)
MARCH, JOOS ([id number]) Visit Report for 08/24/2018 Chief Complaint Document Details Patient Name: [name], [name] Date of Service: 08/24/2018 10:00 AM Medical Record Number: [id number] Patient Account Number: [id number] Date of Birth/Sex: 1923/09/01 (84 y.o. F) Treating RN: Curtis Sites Primary Care Provider: Vonita Moss Other Clinician: Referring Provider: Vonita Moss Treating Provider/Extender: Linwood Dibbles, HOYT Weeks in Treatment: 63 Information Obtained from: Patient Chief Complaint multiple wounds/ulcers Electronic Signature(s) Signed: 08/25/2018 12:54:11 AM By: Lenda Kelp PA-C Entered By: Lenda Kelp on 08/24/2018 10:24:28 [name] ([id number]) -------------------------------------------------------------------------------- HPI Details Patient Name: [name] Date of Service: 08/24/2018 10:00 AM Medical Record Number: [id number] Patient Account Number: [id number] Date of Birth/Sex: [birth year] (84 y.o. F) Treating RN: Curtis Sites Primary Care Provider: Vonita Moss Other Clinician: Referring Provider: Vonita Moss Treating Provider/Extender: Linwood Dibbles, HOYT Weeks in Treatment: 24 History of Present Illness HPI Description: 01/07/17 this is a 84 year old woman admitted to the clinic today for review of a pressure ulcer on her lower sacrum. She is referred from her primary physician's office after being seen on 3/22 with a 3 cm pressure area. Her daughter and caretaker accompanied her today state that the area first became obvious about a month ago and his since deteriorated. They have recently got [name] home health involved and have been using [name] to the wound. They have ordered a pressure relief surface for her mattress. They are turning her religiously. They state that she eats well and they've been forcing fluids on her. She is on a multivitamin. Looking through [facility] point last albumin I see was 4.4 on 10/28. The  patient has advanced parkinsonism which looks superficially like advanced Parkinson's disease although her daughter tells me she did not ever respond to Sinemet therefore this may have another pathology with signs of parkinsonism. However I think this is largely a mute point currently. She also has dementia and is nonambulatory. Since this started they have been keeping her in bed and turning her religiously every 2 hours. She lives at home in [place] with her husband with 24/7 care giving 01/13/17 santyl change qd. Still will require further debridement. continue santyl. 01/20/17; patient's wound actually looks some better less adherent necrotic surface. There is actually visible granulation. We're using [name] 01/27/17; better-looking surface but still a lot of necrotic tissue on the base of this wound. The periwound erythema is better than last week we are still using [name]. Her daughter tells [place] that she is still having trouble with the pressure-relief mattress through medical modalities 02/03/18; I had the patient scheduled for a two week followup however her daughter brought her in early concerned for discoloration on 2 areas of the wound circumference. We have bee using santyl 02/10/17;Better looking surface to the wound. Rim appears better suggesting better offloading. Using santyl 02/24/17; change to Silver Collegen last time. Wound appears better. 03/10/17; still using silver collagen religious offloading. Intake is satisfactory per her daughter. Dimension slightly better 03/12/2017 -- Dr. Jannetta Quint patient who had been seen 2 days ago and was doing fairly well. The patient is brought in by her daughter who noticed a new wound just above the previous wound on her sacral area and going on more to the left lateral side. She was very concerned and we asked her to get in for an opinion. 03/17/17; above is noted. The patient has developed a progressive area to the left of her original wound. This  seems to this started with a ring of red skin  with a more pale interior almost looking fungal. There was a rim of blister through part of the area although this did not look like zoster. They have been applying triamcinolone that was prescribed last week by [name] and the area has a fold to a linear band area which is confluent, erythematous and with obvious epidermal swelling but there is no overt tenderness or crepitus. She has lost some surface epithelium closer to the wound surface itself and now has a more superficial wound in this area and the extending erythema goes towards the left buttock. This is well demarcated between involved in normal skin but once again does not appear to be at all tender. If there is a contact issue here I cannot get the history out of the daughter or the caregiver that are with her. 03/24/17; the patient arrives today with the wound slightly worse slightly more drainage. The bandlike area of erythema that I treated as a possible pineal infection has improved somewhat although proximally is still has confluent erythema without overt tenderness. We have been using silver alginate since the most recent deterioration. To the bandlike degree of erythema we have been using Lotrisone cream 03/31/17; patient arrives today with the wound slightly larger, necrotic surface and surrounding erythema. The bandlike area of erythema that I treated as a possible pineal infection is less swollen but still present I've been using Lotrisone cream on that largely related to the presence of a tinea looking infection when this was first seen. We've been using silver alginate. X-ray that I ordered last week showed no acute bony abnormalities mild fecal impaction. Lab work showed a comprehensive metabolic panel that was normal including an albumin of 3.8. White count was 9.5 hemoglobin 12.3 differential count normal. C-reactive protein was less than 1 and sedimentation rate and 17. The  latter 2 values does not support an ongoing bacterial infection. 04/14/17; patient arrives after a 2 week hiatus. Her wound is not in good condition. Although the base of the wound looks Tina MainlandSHAW, [name] ([id number]) stable she still has an erythematous area that was apparently blistered over the weekend. This again points to the left. As our intake nurse pointed out today this is in the area where the tissue folds together and we may need to prevent try to prevent this. Lab work and x-ray that I did to 3 weeks ago were unremarkable including her albumin nevertheless she is an extremely frail condition physically. We have have been using [name] 04/22/17; I changed her to silver alginate because of the surrounding maceration and moisture last week. The daughter did not like the way the wound looked in the middle of the week and changed her back to MelletteSantyl. They're putting gauze on top of this. Thinks still using Lotrisone. 05/06/17 on evaluation today patient sacral wound appears to be doing okay and does not seem to be any worse. She is having no significant pain during evaluation today the secondary to mental status she is unable to rate or describe whether she had any pain she was not however flinching. Her daughter states that the wound does appear to be looking better to her. Still we are having difficulty with the skinfold that seems to be closing in on itself at this point. All in all I feel like she is making some good progress in the [name] seems to be the official for her. They do tell me that a refill if we are gonna continue that today. No fevers, chills, nausea,  or vomiting noted at this time. 05/13/17 presents today for evaluation concerning her ongoing sacral pressure ulcer. Unfortunately she also has an area of deep tissue injury in the right Ischial region which is starting to show up. The sacral wound also continues to show signs of necrotic tissue overlying and has declined. Overall we  really have not seen a significant improvement in the past several months in regard to the sacral wound and now patient is starting to develop a new wound in the right Ischial region. Obviously this is not trending in the direction that we want to see. No fevers, chills, nausea, or vomiting noted at this time. 05/19/17; I have not seen this wound and almost a month however there is nothing really positive to say about it. Necrotic tissue over the surface which superiorly I think abuts on her sacrum. She has surrounding erythema. I would be surprised if there is not underlying osteomyelitis or soft tissue infection. 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 She apparently eats well per description although I wonder about this looking at her. Lab work I did probably 4 weeks ago however was really quite normal including a serum albumin 05/26/17; culture I did last week grew Escherichia coli and methicillin sensitive staph aureus which should've been well covered by the Augmentin and ciprofloxacin. Indeed the erythema around the wound in the bed of the wound looks somewhat better. Her intake is still satisfactory. They now have a near fluidized bed 06/02/17; they completed the antibiotics last Friday. Using collagen. Daughter still reports eating and drinking well. There is less visualized erythema around the wound. 06/16/17; large pressure ulcer over her lower sacrum and coccyx. Using [name] to the wound bed. 06/30/17; certainly no change in dimensions of this large stage III wound over her sacrum and coccyx. They've been using [name]. There is no exposed bone. She has a candidal/tinea area in the right inguinal area. Other than that her daughter relates that she is eating and drinking well there changing her positioning to make sure the areas offloaded 07/14/17; no major change in the dimensions of this large stage III wound. Initially a smaller wound that became secondarily infected causing  significant tissue breakdown although it is been stable in the last several weeks. Tunneling superiorly at roughly 1:00 no change here either. There is no bone palpable. Both the patient's daughter and caretaker states that she eats well. I have not rechecked her blood work 07/27/17; patient arrives in clinic today and generally a deteriorated looking state. Mild fever with axillary temperature of 100.4. Daughter reports she has not been eating and drinking well since yesterday. She looks more pale and thin and less responsive. We have been using silver collagen to her wound 08/11/17; since the last time the patient was here things have gone better. Her fever went down and she started eating and drinking again. Culture I did of the wound showed methicillin sensitive staph aureus, Morganella and enterococcus. I only treated her with Keflex which would've not covered the Morganella and enterococcus however the purulent area on the 2:00 side of her wound is a lot better and the bandlike erythema that concern me also was resolved. I'd called the daughter last week to confirm that she was a lot better. She finished the Keflex last Thursday she also suffered a skin tear this morning perhaps while putting on her incontinence brief period is on the lateral aspect of her right leg. Clean wound with the surface epithelium not  viable. 08/25/17; last week the patient was noted to have erythema around the wound margin and a slight fever which the patient's daughter says was [id number]. Our office was contacted by home health however we did not have a space to work the patient in that she went to see her primary physician Dr. Maurice MarchLane. She was not febrile during this visit on 08/21/17 there was erythema around the wound similar to last occasion. Dr. Maurice MarchLane in reference to my culture from 07/28/17 and put her on doxycycline capsules which they're opening twice a day for 10 days. 09/17/17; patient arrives today with the wound  bed looking fairly well granulated. There is undermining from 4 to 6:00 although this seems to of contracted slightly. She does not have obvious infection although the daughter states there was some darkening of the wound circumference that is not evident today. They state she is eating well. They are concerned about oral thrush 09/30/16; very fibrin looking granulated wound bed. Her undermining from 4 to 6:00 is about the same but also appears to be well granulated. She has rolled edges of senescent tissue from roughly 7 to 12:00. There is no evidence of infection Tina MainlandSHAW, [name] ([id number]) 10/13/17 on evaluation today patient appears to be doing fairly well all things considered in regard to her sacral wound. There's really not a lot of significant change or improvement she does have some evidence of contusion and deep tissue injury around the left border of the wound that patient's daughter did inquire about today. Nonetheless overall the wound appears to be doing about the same in my opinion. There is no significant indication of infection there also is no significant slough noted at this point. 10/27/17 She is here in follow up evaluation of a sacral ulcer. She is accompanied by her daughter and caregiver. There is red granulation tissue throughout, persistent discoloration to left border; this appears consistent with deep tissue injury. There are multiple areas covered in foam borders, tegaderm, etc that are "preventative" with "no wounds". We will continue with prisma and continue with two week follow ups 11/17/17 on evaluation today patient appears to be doing decently well in regard to her wound at this point. She continues to have a sacral wound ulcer. We see her roughly every two weeks. In the last week she was actually in the hospital due to what was diagnosed as sepsis although no organisms were ever identified in the actual blood cultures. The hospital really was not sure that the  wound was the cause of the infection but they really did not figure out anything else that would be a causative organism she did have a CT scan and that revealed no evidence of pneumonia there was also no evidence of urinary tract infection. Again it very well could have been the wound called in those although wound appears to be doing fairly well at this point. 12/15/17 on evaluation today patient actually appears to be doing about the same in regard to her sacral ulcer. The one thing different is that she does seem to have a rash where her right arm is contracted and being held to the thorax. The area underlying both on the ventral side of the arm as well is the thorax where it comes in contact shows evidence of a rash which appears to be fungal in nature. There does not appear to be any evidence of infection lies at this point. There does not appear to be a rash consistent with shingles which was  also of concern initially. No fevers, chills, nausea, or vomiting noted at this time. 12/29/17 on evaluation today patient appears to be doing a little better in regard to her sacral wound although the one changes the 12 o'clock location of the wound seems to have attached at one point which no longer allows this to pull back. This has caused an area of undermining that seems to be attaching as well in the 12 o'clock location. I do think that this is something that can be managed and is not necessarily a bad thing. Nonetheless she does seem to have some pain with exploration of this region of undermining. She continues to have an area under her right arm on the chest wall of erythema although this is a little better especially after the home health nurse is actually got in order for Diflucan times one for the patient. She is a new area on the left wrist that looks like because of the contraction her nail may have pushed in on her wrist area causing a slight cut which subsequently became infected. She has  some honey crusted drainage noted. 03/09/18 undervaluation today patient appears to be doing fairly well in regard to her wounds in general. She has been tolerating the dressing changes without complication. Overall I'm pleased with the progress that seems to be made week by week especially in regard to the sacral ulcer. Her daughter and the caregiver seem to take very good care of her. With that being said she has very little undermining in regard to the sacral wound in good granulation she also has good epithelialization noted. 03/23/18 on evaluation today patient appears to be doing rather well in regard to her sacral wound. Unfortunately she does have a little bit of necrotic tissue noted in the central portion of her wound on the left elbow. This is due to the fact that she is lying on this arm seeing how it is contracted. Currently her daughter has started to avoid lying on the side it all due to the fact that the necrotic tissue was noted. With that being said they have been taking very good care of her in my pinion. Fortunately there does not appear to be any evidence of infection which is good news. Overall I'm pleased with the progress she's made other than in regard to the elbow. 04/06/18 on evaluation today patient actually appears to be doing okay in regard to the sacral wound. In fact it appears to be somewhat smaller in general although there does appear to be more undermining at the 6 o'clock location than was previously noted. She has been tolerating the dressing changes without complication in general which is also good news. Nonetheless she does have a new small skin tear/opening on her leg which was evaluated today. Fortunately this appears to be minimal and the Xeroform gauze which has been used up to this point in the past has been utilized very effectively seems to be doing well in that regard. Her left elbow ulcer seems to also be doing excellent at this point making good  progress. 04/20/18 on evaluation today patient appears to be doing fairly well in regard to her sacral wound. This definitely seems to be better than during last evaluation where it was indeed infected. With that being said she has been tolerating the dressing changes as best it can be expected. Her elbow seems to be drying out and a lot of times the dressing is getting stuck according to her caregiver. [name],  [name] ([id number]) 05/04/18-She is seen in follow-up evaluation for a sacral and left elbow pressure ulcer. These are stable/improved and we will continue with same treatment plan and she will follow-up in 2 weeks 05/18/18 on evaluation today patient actually appears to be doing better in regard to her left elbow ulcer. The sacral wound is also shown signs of improvement which is good news. With that being said she does have a new right medial lower extremity ulcer which actually appears to show some signs of cellulitis/infection. She is previously taken Augmentin with good result. Nonetheless the patient really does not have anything that I can culture at the site there's actually some good epithelialization noted although again there is some cellulitis appearance as well. 06/01/18 on evaluation today patient appears to actually be doing very well in general in regard to her left elbow wound which is smaller in her lower extremity ulcer which is actually healed. With that being said the sacral wound in particular show signs of improvement although she still has undermining in the six-7 o'clock location. No fevers, chills, nausea, or vomiting noted at this time. 06/15/18 on evaluation today patient actually appears to be doing excellent in regard to her elbow ulcer on the left elbow. This is shown signs of great improvement and I do feel like she is very close to healing in this regard. In general her sacral wound also appears to be doing well there's good sign of improvement there as well as  far as the overall surface of the wound is concerned. She does have a significant area of tunneling at the 6 o'clock location that still is about the same really there's no significant improvement in that regard. Nonetheless we are attempting to try and treat this by way of packing with Prisma followed by silver so to help control moisture. The patient been tolerating the dressing changes without complication. 06/29/18 on evaluation today patient actually appears to be doing very well in regard to her left elbow ulcer and her sacral ulcer. She has been tolerating the dressing changes without complication which is good news. In fact the sacral wound appears to have almost completely closed although she does have an area of undermining/tunneling at roughly the 7 o'clock location that is still unfortunately having a more difficult time closing although as opposed to during the last visit I was unable to get a normal Q-tip down into the undermined region. Actually had to use a skinny probe or else the backside of the Q-tip were just a wooden stake was. I think this is a good sign that this area seems to be filling in which is wonderful. Overall I'm pleased with how things are progressing. 07/13/18 on evaluation today patient appears to be doing a little worse in regard to the overall appearance of the sacral wound. She has been tolerating the dressing changes without complication. With that being said the patient's daughter tells me that the home health nurse actually dug around it significantly in the 12 o'clock location of the wound and subsequently reopen this area which was very well healed previous. This was a new nurse that hasn't generally been coming out to see the patient. Nonetheless they are very upset about this and the fact that the wound was actually worse when she was done "taking". It does appear that the wound is recovering and in fact since that point has closed quite significantly  compared to what they tell me it looked like when she was done.  07/27/18 evaluation today patient appears to be doing well in regard to her elbow ulcer as well as the sacral ulcer. She has actually been doing excellent at this point in time. Fortunately there does not appear to be any evidence of infection currently. Overall I'm very happy with the progress at this time. She does have a couple other areas where there was some redness noted a fortunately there does not appear to be the evidence of more significant skin breakdown although there just slightly blanchable regions that I think do need to be watched carefully. 08/10/18 upon evaluation today patient actually appears to be showing signs of improvement at all sites evaluated today. This is even true in the sacral region where she has less expensive undermining although there is still undermining noted unfortunately. They're having a very hard time being able to pack anything into this area. She may benefit from utilizing gentamicin cream and just a cover dressing. 08/24/18 on evaluation today patient sacral ulcer actually appears to be almost completely healed. She has done extremely well in this regard and I think this is in most part due to her family who has been excellent in taking care of her as well as her caregiver. Overall I'm extremely pleased in this regard. Her left shoulder area did open up since the last visit where I saw her although this was something that we potentially expected. Fortunately it does not appear to show any signs of significant infection overall I think that an alginate dressing would be very well at the site. Electronic Signature(s) Signed: 08/25/2018 12:54:11 AM By: Lenda Kelp PA-C Entered By: Lenda Kelp on 08/24/2018 11:12:27 [name] ([id number]) [name] ([id number]) -------------------------------------------------------------------------------- Physical Exam Details Patient  Name: [name] Date of Service: 08/24/2018 10:00 AM Medical Record Number: [id number] Patient Account Number: [id number] Date of Birth/Sex: [birth year] (84 y.o. F) Treating RN: Curtis Sites Primary Care Provider: Vonita Moss Other Clinician: Referring Provider: Vonita Moss Treating Provider/Extender: STONE III, HOYT Weeks in Treatment: 59 Constitutional Chronically ill appearing but in no apparent acute distress. Respiratory normal breathing without difficulty. clear to auscultation bilaterally. Cardiovascular regular rate and rhythm with normal S1, S2. Psychiatric Patient is not able to cooperate in decision making regarding care. Patient has dementia. patient is confused. Notes Patient's wound bed currently shows evidence of good granulation which is great news. Fortunately there does not appear to be any evidence of infection which is also great news. She is almost completely healed as far as the wound is concerned. This is in regard to the sacral region. Her left shoulder wound is very small unfortunately shows no signs of infection at this time. Electronic Signature(s) Signed: 08/25/2018 12:54:11 AM By: Lenda Kelp PA-C Entered By: Lenda Kelp on 08/24/2018 11:13:07 [name] ([id number]) -------------------------------------------------------------------------------- Physician Orders Details Patient Name: [name] Date of Service: 08/24/2018 10:00 AM Medical Record Number: [id number] Patient Account Number: [id number] Date of Birth/Sex: 05-20-1923 (84 y.o. F) Treating RN: Curtis Sites Primary Care Provider: Vonita Moss Other Clinician: Referring Provider: Vonita Moss Treating Provider/Extender: Linwood Dibbles, HOYT Weeks in Treatment: 74 Verbal / Phone Orders: No Diagnosis Coding ICD-10 Coding Code Description L89.153 Pressure ulcer of sacral region, stage 3 L89.023 Pressure ulcer of left elbow, stage 3 G20 Parkinson's  disease L03.312 Cellulitis of back [any part except buttock] S81.811D Laceration without foreign body, right lower leg, subsequent encounter Wound Cleansing Wound #1 Midline Coccyx o Clean wound with Normal Saline. o May  Shower, gently pat wound dry prior to applying new dressing. Wound #13 Left Shoulder o Clean wound with Normal Saline. o May Shower, gently pat wound dry prior to applying new dressing. Skin Barriers/Peri-Wound Care Wound #1 Midline Coccyx o Skin Prep - use only where the allevyn sticks not on reddened areas Wound #13 Left Shoulder o Skin Prep - use only where the allevyn sticks not on reddened areas Primary Wound Dressing Wound #1 Midline Coccyx o Gentamicin Sulfate Cream Wound #13 Left Shoulder o Silver Alginate Secondary Dressing Wound #1 Midline Coccyx o Dry Gauze - soft non woven gauze o Boardered Foam Dressing - HHRN PLEASE ORDER ALLEVYN LIFE BORDERED FOAM FOR PATIENT (OFF BRANDS IRRITATE HER SKIN); ALSO PLEASE COVER ANY PRESSURE THREATENING AREAS WITH ALLEVYN LIFE BORDERED FOAM Wound #13 Left Shoulder o Dry Gauze - soft non woven gauze [name], [name]. ([id number]) o Boardered Foam Dressing - HHRN PLEASE ORDER ALLEVYN LIFE BORDERED FOAM FOR PATIENT (OFF BRANDS IRRITATE HER SKIN); ALSO PLEASE COVER ANY PRESSURE THREATENING AREAS WITH ALLEVYN LIFE BORDERED FOAM Dressing Change Frequency Wound #1 Midline Coccyx o Change dressing every day. Wound #13 Left Shoulder o Change dressing every day. Follow-up Appointments o Return Appointment in 2 weeks. Off-Loading Wound #1 Midline Coccyx o Roho cushion for wheelchair o Mattress - fluidized air mattress o Turn and reposition every 2 hours Additional Orders / Instructions Wound #1 Midline Coccyx o Increase protein intake. - please add protein supplements to patients diet o Other: - please add vitamin A, vitamin C and zinc supplements to patients diet Home  Health Wound #1 Midline Coccyx o Continue Home Health Visits - [facility] o Home Health Nurse may visit PRN to address patientos wound care needs. o FACE TO FACE ENCOUNTER: MEDICARE and MEDICAID PATIENTS: I certify that this patient is under my care and that I had a face-to-face encounter that meets the physician face-to-face encounter requirements with this patient on this date. The encounter with the patient was in whole or in part for the following MEDICAL CONDITION: (primary reason for Home Healthcare) MEDICAL NECESSITY: I certify, that based on my findings, NURSING services are a medically necessary home health service. HOME BOUND STATUS: I certify that my clinical findings support that this patient is homebound (i.e., Due to illness or injury, pt requires aid of supportive devices such as crutches, cane, wheelchairs, walkers, the use of special transportation or the assistance of another person to leave their place of residence. There is a normal inability to leave the home and doing so requires considerable and taxing effort. Other absences are for medical reasons / religious services and are infrequent or of short duration when for other reasons). o If current dressing causes regression in wound condition, may D/C ordered dressing product/s and apply Normal Saline Moist Dressing daily until next Wound Healing Center / Other MD appointment. Notify Wound Healing Center of regression in wound condition at [phone]. o Please direct any NON-WOUND related issues/requests for orders to patient's Primary Care Physician - Dr Vonita Moss Wound #13 Left Shoulder o Continue Home Health Visits - [facility] o Home Health Nurse may visit PRN to address patientos wound care needs. o FACE TO FACE ENCOUNTER: MEDICARE and MEDICAID PATIENTS: I certify that this patient is under my care and that I had a face-to-face encounter that meets the physician face-to-face encounter requirements  with this patient on this date. The encounter with the patient was in whole or in part for the following MEDICAL CONDITION: (primary reason for  Home Healthcare) MEDICAL NECESSITY: I certify, that based on my findings, NURSING services are a medically necessary home health service. HOME BOUND STATUS: I certify that my clinical findings support that this patient is homebound (i.e., Due to illness or injury, pt requires aid of supportive devices such as crutches, cane, wheelchairs, walkers, the use of special transportation or the assistance of another person to leave their place of residence. There is a normal inability to leave the home [name], [name]. ([id number]) and doing so requires considerable and taxing effort. Other absences are for medical reasons / religious services and are infrequent or of short duration when for other reasons). o If current dressing causes regression in wound condition, may D/C ordered dressing product/s and apply Normal Saline Moist Dressing daily until next Wound Healing Center / Other MD appointment. Notify Wound Healing Center of regression in wound condition at [phone]. o Please direct any NON-WOUND related issues/requests for orders to patient's Primary Care Physician - Dr Vonita Moss Electronic Signature(s) Signed: 08/24/2018 5:02:25 PM By: Curtis Sites Signed: 08/25/2018 12:54:11 AM By: Lenda Kelp PA-C Entered By: Curtis Sites on 08/24/2018 10:40:58 [name] ([id number]) -------------------------------------------------------------------------------- Problem List Details Patient Name: [name] Date of Service: 08/24/2018 10:00 AM Medical Record Number: [id number] Patient Account Number: [id number] Date of Birth/Sex: [birth year] (84 y.o. F) Treating RN: Curtis Sites Primary Care Provider: Vonita Moss Other Clinician: Referring Provider: Vonita Moss Treating Provider/Extender: Linwood Dibbles, HOYT Weeks in  Treatment: 86 Active Problems ICD-10 Evaluated Encounter Code Description Active Date Today Diagnosis L89.153 Pressure ulcer of sacral region, stage 3 01/07/2017 No Yes L89.023 Pressure ulcer of left elbow, stage 3 03/23/2018 No Yes G20 Parkinson's disease 01/07/2017 No Yes L03.312 Cellulitis of back [any part except buttock] 07/28/2017 No Yes S81.811D Laceration without foreign body, right lower leg, subsequent 08/11/2017 No Yes encounter Inactive Problems Resolved Problems Electronic Signature(s) Signed: 08/25/2018 12:54:11 AM By: Lenda Kelp PA-C Entered By: Lenda Kelp on 08/24/2018 10:24:23 [name] ([id number]) -------------------------------------------------------------------------------- Progress Note Details Patient Name: [name] Date of Service: 08/24/2018 10:00 AM Medical Record Number: [id number] Patient Account Number: [id number] Date of Birth/Sex: [birth year] (84 y.o. F) Treating RN: Curtis Sites Primary Care Provider: Vonita Moss Other Clinician: Referring Provider: Vonita Moss Treating Provider/Extender: Linwood Dibbles, HOYT Weeks in Treatment: 77 Subjective Chief Complaint Information obtained from Patient multiple wounds/ulcers History of Present Illness (HPI) 01/07/17 this is a 84 year old woman admitted to the clinic today for review of a pressure ulcer on her lower sacrum. She is referred from her primary physician's office after being seen on 3/22 with a 3 cm pressure area. Her daughter and caretaker accompanied her today state that the area first became obvious about a month ago and his since deteriorated. They have recently got [name] a home health involved and have been using [name] to the wound. They have ordered a pressure relief surface for her mattress. They are turning her religiously. They state that she eats well and they've been forcing fluids on her. She is on a multivitamin. Looking through [facility] point last  albumin I see was 4.4 on 10/28. The patient has advanced parkinsonism which looks superficially like advanced Parkinson's disease although her daughter tells me she did not ever respond to Sinemet therefore this may have another pathology with signs of parkinsonism. However I think this is largely a mute point currently. She also has dementia and is nonambulatory. Since this started they have been keeping her in bed and  turning her religiously every 2 hours. She lives at home in [place] with her husband with 24/7 care giving 01/13/17 santyl change qd. Still will require further debridement. continue santyl. 01/20/17; patient's wound actually looks some better less adherent necrotic surface. There is actually visible granulation. We're using [name] 01/27/17; better-looking surface but still a lot of necrotic tissue on the base of this wound. The periwound erythema is better than last week we are still using [name]. Her daughter tells [place] that she is still having trouble with the pressure-relief mattress through medical modalities 02/03/18; I had the patient scheduled for a two week followup however her daughter brought her in early concerned for discoloration on 2 areas of the wound circumference. We have bee using santyl 02/10/17;Better looking surface to the wound. Rim appears better suggesting better offloading. Using santyl 02/24/17; change to Silver Collegen last time. Wound appears better. 03/10/17; still using silver collagen religious offloading. Intake is satisfactory per her daughter. Dimension slightly better 03/12/2017 -- Dr. Jannetta Quint patient who had been seen 2 days ago and was doing fairly well. The patient is brought in by her daughter who noticed a new wound just above the previous wound on her sacral area and going on more to the left lateral side. She was very concerned and we asked her to get in for an opinion. 03/17/17; above is noted. The patient has developed a progressive area to the  left of her original wound. This seems to this started with a ring of red skin with a more pale interior almost looking fungal. There was a rim of blister through part of the area although this did not look like zoster. They have been applying triamcinolone that was prescribed last week by Dr. Meyer Russel and the area has a fold to a linear band area which is confluent, erythematous and with obvious epidermal swelling but there is no overt tenderness or crepitus. She has lost some surface epithelium closer to the wound surface itself and now has a more superficial wound in this area and the extending erythema goes towards the left buttock. This is well demarcated between involved in normal skin but once again does not appear to be at all tender. If there is a contact issue here I cannot get the history out of the daughter or the caregiver that are with her. 03/24/17; the patient arrives today with the wound slightly worse slightly more drainage. The bandlike area of erythema that I treated as a possible pineal infection has improved somewhat although proximally is still has confluent erythema without overt tenderness. We have been using silver alginate since the most recent deterioration. To the bandlike degree of erythema we have been using Lotrisone cream 03/31/17; patient arrives today with the wound slightly larger, necrotic surface and surrounding erythema. The bandlike area of erythema that I treated as a possible pineal infection is less swollen but still present I've been using Lotrisone cream on that largely related to the presence of a tinea looking infection when this was first seen. We've been using silver alginate. [name], [name] ([id number]) X-ray that I ordered last week showed no acute bony abnormalities mild fecal impaction. Lab work showed a comprehensive metabolic panel that was normal including an albumin of 3.8. White count was 9.5 hemoglobin 12.3 differential count  normal. C-reactive protein was less than 1 and sedimentation rate and 17. The latter 2 values does not support an ongoing bacterial infection. 04/14/17; patient arrives after a 2 week hiatus. Her wound is not  in good condition. Although the base of the wound looks stable she still has an erythematous area that was apparently blistered over the weekend. This again points to the left. As our intake nurse pointed out today this is in the area where the tissue folds together and we may need to prevent try to prevent this. Lab work and x-ray that I did to 3 weeks ago were unremarkable including her albumin nevertheless she is an extremely frail condition physically. We have have been using [name] 04/22/17; I changed her to silver alginate because of the surrounding maceration and moisture last week. The daughter did not like the way the wound looked in the middle of the week and changed her back to [place]. They're putting gauze on top of this. Thinks still using Lotrisone. 05/06/17 on evaluation today patient sacral wound appears to be doing okay and does not seem to be any worse. She is having no significant pain during evaluation today the secondary to mental status she is unable to rate or describe whether she had any pain she was not however flinching. Her daughter states that the wound does appear to be looking better to her. Still we are having difficulty with the skinfold that seems to be closing in on itself at this point. All in all I feel like she is making some good progress in the [name] seems to be the official for her. They do tell me that a refill if we are gonna continue that today. No fevers, chills, nausea, or vomiting noted at this time. 05/13/17 presents today for evaluation concerning her ongoing sacral pressure ulcer. Unfortunately she also has an area of deep tissue injury in the right Ischial region which is starting to show up. The sacral wound also continues to show signs  of necrotic tissue overlying and has declined. Overall we really have not seen a significant improvement in the past several months in regard to the sacral wound and now patient is starting to develop a new wound in the right Ischial region. Obviously this is not trending in the direction that we want to see. No fevers, chills, nausea, or vomiting noted at this time. 05/19/17; I have not seen this wound and almost a month however there is nothing really positive to say about it. Necrotic tissue over the surface which superiorly I think abuts on her sacrum. She has surrounding erythema. I would be surprised if there is not underlying osteomyelitis or soft tissue infection. 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 She apparently eats well per description although I wonder about this looking at her. Lab work I did probably 4 weeks ago however was really quite normal including a serum albumin 05/26/17; culture I did last week grew Escherichia coli and methicillin sensitive staph aureus which should've been well covered by the Augmentin and ciprofloxacin. Indeed the erythema around the wound in the bed of the wound looks somewhat better. Her intake is still satisfactory. They now have a near fluidized bed 06/02/17; they completed the antibiotics last Friday. Using collagen. Daughter still reports eating and drinking well. There is less visualized erythema around the wound. 06/16/17; large pressure ulcer over her lower sacrum and coccyx. Using [name] to the wound bed. 06/30/17; certainly no change in dimensions of this large stage III wound over her sacrum and coccyx. They've been using [name]. There is no exposed bone. She has a candidal/tinea area in the right inguinal area. Other than that her daughter relates that she is  eating and drinking well there changing her positioning to make sure the areas offloaded 07/14/17; no major change in the dimensions of this large stage III wound. Initially a  smaller wound that became secondarily infected causing significant tissue breakdown although it is been stable in the last several weeks. Tunneling superiorly at roughly 1:00 no change here either. There is no bone palpable. Both the patient's daughter and caretaker states that she eats well. I have not rechecked her blood work 07/27/17; patient arrives in clinic today and generally a deteriorated looking state. Mild fever with axillary temperature of 100.4. Daughter reports she has not been eating and drinking well since yesterday. She looks more pale and thin and less responsive. We have been using silver collagen to her wound 08/11/17; since the last time the patient was here things have gone better. Her fever went down and she started eating and drinking again. Culture I did of the wound showed methicillin sensitive staph aureus, Morganella and enterococcus. I only treated her with Keflex which would've not covered the Morganella and enterococcus however the purulent area on the 2:00 side of her wound is a lot better and the bandlike erythema that concern me also was resolved. I'd called the daughter last week to confirm that she was a lot better. She finished the Keflex last Thursday she also suffered a skin tear this morning perhaps while putting on her incontinence brief period is on the lateral aspect of her right leg. Clean wound with the surface epithelium not viable. 08/25/17; last week the patient was noted to have erythema around the wound margin and a slight fever which the patient's daughter says was 20. Our office was contacted by home health however we did not have a space to work the patient in that she went to see her primary physician Dr. Maurice March. She was not febrile during this visit on 08/21/17 there was erythema around the wound similar to last occasion. Dr. Maurice March in reference to my culture from 07/28/17 and put her on doxycycline capsules which they're opening twice a day for 10  days. [name], [name] ([id number]) 09/17/17; patient arrives today with the wound bed looking fairly well granulated. There is undermining from 4 to 6:00 although this seems to of contracted slightly. She does not have obvious infection although the daughter states there was some darkening of the wound circumference that is not evident today. They state she is eating well. They are concerned about oral thrush 09/30/16; very fibrin looking granulated wound bed. Her undermining from 4 to 6:00 is about the same but also appears to be well granulated. She has rolled edges of senescent tissue from roughly 7 to 12:00. There is no evidence of infection 10/13/17 on evaluation today patient appears to be doing fairly well all things considered in regard to her sacral wound. There's really not a lot of significant change or improvement she does have some evidence of contusion and deep tissue injury around the left border of the wound that patient's daughter did inquire about today. Nonetheless overall the wound appears to be doing about the same in my opinion. There is no significant indication of infection there also is no significant slough noted at this point. 10/27/17 She is here in follow up evaluation of a sacral ulcer. She is accompanied by her daughter and caregiver. There is red granulation tissue throughout, persistent discoloration to left border; this appears consistent with deep tissue injury. There are multiple areas covered in foam borders, tegaderm, etc  that are "preventative" with "no wounds". We will continue with prisma and continue with two week follow ups 11/17/17 on evaluation today patient appears to be doing decently well in regard to her wound at this point. She continues to have a sacral wound ulcer. We see her roughly every two weeks. In the last week she was actually in the hospital due to what was diagnosed as sepsis although no organisms were ever identified in the actual blood  cultures. The hospital really was not sure that the wound was the cause of the infection but they really did not figure out anything else that would be a causative organism she did have a CT scan and that revealed no evidence of pneumonia there was also no evidence of urinary tract infection. Again it very well could have been the wound called in those although wound appears to be doing fairly well at this point. 12/15/17 on evaluation today patient actually appears to be doing about the same in regard to her sacral ulcer. The one thing different is that she does seem to have a rash where her right arm is contracted and being held to the thorax. The area underlying both on the ventral side of the arm as well is the thorax where it comes in contact shows evidence of a rash which appears to be fungal in nature. There does not appear to be any evidence of infection lies at this point. There does not appear to be a rash consistent with shingles which was also of concern initially. No fevers, chills, nausea, or vomiting noted at this time. 12/29/17 on evaluation today patient appears to be doing a little better in regard to her sacral wound although the one changes the 12 o'clock location of the wound seems to have attached at one point which no longer allows this to pull back. This has caused an area of undermining that seems to be attaching as well in the 12 o'clock location. I do think that this is something that can be managed and is not necessarily a bad thing. Nonetheless she does seem to have some pain with exploration of this region of undermining. She continues to have an area under her right arm on the chest wall of erythema although this is a little better especially after the home health nurse is actually got in order for Diflucan times one for the patient. She is a new area on the left wrist that looks like because of the contraction her nail may have pushed in on her wrist area causing a  slight cut which subsequently became infected. She has some honey crusted drainage noted. 03/09/18 undervaluation today patient appears to be doing fairly well in regard to her wounds in general. She has been tolerating the dressing changes without complication. Overall I'm pleased with the progress that seems to be made week by week especially in regard to the sacral ulcer. Her daughter and the caregiver seem to take very good care of her. With that being said she has very little undermining in regard to the sacral wound in good granulation she also has good epithelialization noted. 03/23/18 on evaluation today patient appears to be doing rather well in regard to her sacral wound. Unfortunately she does have a little bit of necrotic tissue noted in the central portion of her wound on the left elbow. This is due to the fact that she is lying on this arm seeing how it is contracted. Currently her daughter has started to avoid  lying on the side it all due to the fact that the necrotic tissue was noted. With that being said they have been taking very good care of her in my pinion. Fortunately there does not appear to be any evidence of infection which is good news. Overall I'm pleased with the progress she's made other than in regard to the elbow. 04/06/18 on evaluation today patient actually appears to be doing okay in regard to the sacral wound. In fact it appears to be somewhat smaller in general although there does appear to be more undermining at the 6 o'clock location than was previously noted. She has been tolerating the dressing changes without complication in general which is also good news. Nonetheless she does have a new small skin tear/opening on her leg which was evaluated today. Fortunately this appears to be minimal and the Xeroform gauze which has been used up to this point in the past has been utilized very effectively seems [name], [name]. ([id number]) to be doing well in that regard.  Her left elbow ulcer seems to also be doing excellent at this point making good progress. 04/20/18 on evaluation today patient appears to be doing fairly well in regard to her sacral wound. This definitely seems to be better than during last evaluation where it was indeed infected. With that being said she has been tolerating the dressing changes as best it can be expected. Her elbow seems to be drying out and a lot of times the dressing is getting stuck according to her caregiver. 05/04/18-She is seen in follow-up evaluation for a sacral and left elbow pressure ulcer. These are stable/improved and we will continue with same treatment plan and she will follow-up in 2 weeks 05/18/18 on evaluation today patient actually appears to be doing better in regard to her left elbow ulcer. The sacral wound is also shown signs of improvement which is good news. With that being said she does have a new right medial lower extremity ulcer which actually appears to show some signs of cellulitis/infection. She is previously taken Augmentin with good result. Nonetheless the patient really does not have anything that I can culture at the site there's actually some good epithelialization noted although again there is some cellulitis appearance as well. 06/01/18 on evaluation today patient appears to actually be doing very well in general in regard to her left elbow wound which is smaller in her lower extremity ulcer which is actually healed. With that being said the sacral wound in particular show signs of improvement although she still has undermining in the six-7 o'clock location. No fevers, chills, nausea, or vomiting noted at this time. 06/15/18 on evaluation today patient actually appears to be doing excellent in regard to her elbow ulcer on the left elbow. This is shown signs of great improvement and I do feel like she is very close to healing in this regard. In general her sacral wound also appears to be doing well  there's good sign of improvement there as well as far as the overall surface of the wound is concerned. She does have a significant area of tunneling at the 6 o'clock location that still is about the same really there's no significant improvement in that regard. Nonetheless we are attempting to try and treat this by way of packing with Prisma followed by silver so to help control moisture. The patient been tolerating the dressing changes without complication. 06/29/18 on evaluation today patient actually appears to be doing very well in regard to  her left elbow ulcer and her sacral ulcer. She has been tolerating the dressing changes without complication which is good news. In fact the sacral wound appears to have almost completely closed although she does have an area of undermining/tunneling at roughly the 7 o'clock location that is still unfortunately having a more difficult time closing although as opposed to during the last visit I was unable to get a normal Q-tip down into the undermined region. Actually had to use a skinny probe or else the backside of the Q-tip were just a wooden stake was. I think this is a good sign that this area seems to be filling in which is wonderful. Overall I'm pleased with how things are progressing. 07/13/18 on evaluation today patient appears to be doing a little worse in regard to the overall appearance of the sacral wound. She has been tolerating the dressing changes without complication. With that being said the patient's daughter tells me that the home health nurse actually dug around it significantly in the 12 o'clock location of the wound and subsequently reopen this area which was very well healed previous. This was a new nurse that hasn't generally been coming out to see the patient. Nonetheless they are very upset about this and the fact that the wound was actually worse when she was done "taking". It does appear that the wound is recovering and in fact  since that point has closed quite significantly compared to what they tell me it looked like when she was done. 07/27/18 evaluation today patient appears to be doing well in regard to her elbow ulcer as well as the sacral ulcer. She has actually been doing excellent at this point in time. Fortunately there does not appear to be any evidence of infection currently. Overall I'm very happy with the progress at this time. She does have a couple other areas where there was some redness noted a fortunately there does not appear to be the evidence of more significant skin breakdown although there just slightly blanchable regions that I think do need to be watched carefully. 08/10/18 upon evaluation today patient actually appears to be showing signs of improvement at all sites evaluated today. This is even true in the sacral region where she has less expensive undermining although there is still undermining noted unfortunately. They're having a very hard time being able to pack anything into this area. She may benefit from utilizing gentamicin cream and just a cover dressing. 08/24/18 on evaluation today patient sacral ulcer actually appears to be almost completely healed. She has done extremely well in this regard and I think this is in most part due to her family who has been excellent in taking care of her as well as her caregiver. Overall I'm extremely pleased in this regard. Her left shoulder area did open up since the last visit where I saw her although this was something that we potentially expected. Fortunately it does not appear to show any signs of significant infection overall I think that an alginate dressing would be very well at the site. [name], [name] ([id number]) Patient History Unable to Obtain Patient History due to Altered Mental Status. Information obtained from Patient. Social History Never smoker, Marital Status - Married, Alcohol Use - Never, Drug Use - No History, Caffeine Use  - Never. Medical And Surgical History Notes Ear/Nose/Mouth/Throat dysphagia Neurologic parkinsons, tremor Review of Systems (ROS) Constitutional Symptoms (General Health) Denies complaints or symptoms of Fever, Chills. Hematologic/Lymphatic The patient has no complaints or  symptoms. Respiratory The patient has no complaints or symptoms. Cardiovascular The patient has no complaints or symptoms. Psychiatric The patient has no complaints or symptoms. Objective Constitutional Chronically ill appearing but in no apparent acute distress. Vitals Time Taken: 10:06 AM, Height: 60 in, Weight: 90 lbs, BMI: 17.6, Temperature: 97.5 F, Pulse: 60 bpm, Respiratory Rate: 16 breaths/min, Blood Pressure: 114/78 mmHg. Respiratory normal breathing without difficulty. clear to auscultation bilaterally. Cardiovascular regular rate and rhythm with normal S1, S2. Psychiatric Patient is not able to cooperate in decision making regarding care. Patient has dementia. patient is confused. General Notes: Patient's wound bed currently shows evidence of good granulation which is great news. Fortunately there does not appear to be any evidence of infection which is also great news. She is almost completely healed as far as the wound is concerned. This is in regard to the sacral region. Her left shoulder wound is very small unfortunately shows no signs of infection at this time. [name], [name] ([id number]) Integumentary (Hair, Skin) Wound #1 status is Open. Original cause of wound was Pressure Injury. The wound is located on the Midline Coccyx. The wound measures 0.2cm length x 0.2cm width x 0.1cm depth; 0.031cm^2 area and 0.003cm^3 volume. There is Fat Layer (Subcutaneous Tissue) Exposed exposed. There is no tunneling or undermining noted. There is a small amount of serous drainage noted. The wound margin is epibole. There is large (67-100%) red granulation within the wound bed. There is no necrotic tissue  within the wound bed. The periwound skin appearance exhibited: Rash. The periwound skin appearance did not exhibit: Callus, Crepitus, Excoriation, Induration, Scarring, Dry/Scaly, Maceration, Atrophie Blanche, Cyanosis, Ecchymosis, Hemosiderin Staining, Mottled, Pallor, Rubor, Erythema. Periwound temperature was noted as No Abnormality. Wound #13 status is Open. Original cause of wound was Pressure Injury. The wound is located on the Left Shoulder. The wound measures 2cm length x 1.8cm width x 0.1cm depth; 2.827cm^2 area and 0.283cm^3 volume. There is Fat Layer (Subcutaneous Tissue) Exposed exposed. There is no tunneling or undermining noted. There is a small amount of serous drainage noted. The wound margin is flat and intact. There is small (1-33%) granulation within the wound bed. There is no necrotic tissue within the wound bed. The periwound skin appearance exhibited: Dry/Scaly, Erythema. The periwound skin appearance did not exhibit: Callus, Crepitus, Excoriation, Induration, Rash, Scarring, Maceration, Atrophie Blanche, Cyanosis, Ecchymosis, Hemosiderin Staining, Mottled, Pallor, Rubor. The surrounding wound skin color is noted with erythema. Periwound temperature was noted as No Abnormality. The periwound has tenderness on palpation. Assessment Active Problems ICD-10 Pressure ulcer of sacral region, stage 3 Pressure ulcer of left elbow, stage 3 Parkinson's disease Cellulitis of back [any part except buttock] Laceration without foreign body, right lower leg, subsequent encounter Plan Wound Cleansing: Wound #1 Midline Coccyx: Clean wound with Normal Saline. May Shower, gently pat wound dry prior to applying new dressing. Wound #13 Left Shoulder: Clean wound with Normal Saline. May Shower, gently pat wound dry prior to applying new dressing. Skin Barriers/Peri-Wound Care: Wound #1 Midline Coccyx: Skin Prep - use only where the allevyn sticks not on reddened areas Wound #13 Left  Shoulder: Skin Prep - use only where the allevyn sticks not on reddened areas Primary Wound Dressing: Wound #1 Midline Coccyx: Gentamicin Sulfate Cream Wound #13 Left Shoulder: Silver Alginate Secondary Dressing: Wound #1 Midline Coccyx: [name], [name] ([id number]) Dry Gauze - soft non woven gauze Boardered Foam Dressing - HHRN PLEASE ORDER ALLEVYN LIFE BORDERED FOAM FOR PATIENT (OFF BRANDS IRRITATE HER SKIN);  ALSO PLEASE COVER ANY PRESSURE THREATENING AREAS WITH ALLEVYN LIFE BORDERED FOAM Wound #13 Left Shoulder: Dry Gauze - soft non woven gauze Boardered Foam Dressing - HHRN PLEASE ORDER ALLEVYN LIFE BORDERED FOAM FOR PATIENT (OFF BRANDS IRRITATE HER SKIN); ALSO PLEASE COVER ANY PRESSURE THREATENING AREAS WITH ALLEVYN LIFE BORDERED FOAM Dressing Change Frequency: Wound #1 Midline Coccyx: Change dressing every day. Wound #13 Left Shoulder: Change dressing every day. Follow-up Appointments: Return Appointment in 2 weeks. Off-Loading: Wound #1 Midline Coccyx: Roho cushion for wheelchair Mattress - fluidized air mattress Turn and reposition every 2 hours Additional Orders / Instructions: Wound #1 Midline Coccyx: Increase protein intake. - please add protein supplements to patients diet Other: - please add vitamin A, vitamin C and zinc supplements to patients diet Home Health: Wound #1 Midline Coccyx: Continue Home Health Visits - [facility] Nurse may visit PRN to address patient s wound care needs. FACE TO FACE ENCOUNTER: MEDICARE and MEDICAID PATIENTS: I certify that this patient is under my care and that I had a face-to-face encounter that meets the physician face-to-face encounter requirements with this patient on this date. The encounter with the patient was in whole or in part for the following MEDICAL CONDITION: (primary reason for Home Healthcare) MEDICAL NECESSITY: I certify, that based on my findings, NURSING services are a medically necessary home health  service. HOME BOUND STATUS: I certify that my clinical findings support that this patient is homebound (i.e., Due to illness or injury, pt requires aid of supportive devices such as crutches, cane, wheelchairs, walkers, the use of special transportation or the assistance of another person to leave their place of residence. There is a normal inability to leave the home and doing so requires considerable and taxing effort. Other absences are for medical reasons / religious services and are infrequent or of short duration when for other reasons). If current dressing causes regression in wound condition, may D/C ordered dressing product/s and apply Normal Saline Moist Dressing daily until next Wound Healing Center / Other MD appointment. Notify Wound Healing Center of regression in wound condition at [phone]. Please direct any NON-WOUND related issues/requests for orders to patient's Primary Care Physician - Dr Vonita Moss Wound #13 Left Shoulder: Continue Home Health Visits - [facility] Nurse may visit PRN to address patient s wound care needs. FACE TO FACE ENCOUNTER: MEDICARE and MEDICAID PATIENTS: I certify that this patient is under my care and that I had a face-to-face encounter that meets the physician face-to-face encounter requirements with this patient on this date. The encounter with the patient was in whole or in part for the following MEDICAL CONDITION: (primary reason for Home Healthcare) MEDICAL NECESSITY: I certify, that based on my findings, NURSING services are a medically necessary home health service. HOME BOUND STATUS: I certify that my clinical findings support that this patient is homebound (i.e., Due to illness or injury, pt requires aid of supportive devices such as crutches, cane, wheelchairs, walkers, the use of special transportation or the assistance of another person to leave their place of residence. There is a normal inability to leave the home and doing  so requires considerable and taxing effort. Other absences are for medical reasons / religious services and are infrequent or of short duration when for other reasons). If current dressing causes regression in wound condition, may D/C ordered dressing product/s and apply Normal Saline Moist Dressing daily until next Wound Healing Center / Other MD appointment. Notify Wound Healing Center of regression  in wound condition at [phone]. Please direct any NON-WOUND related issues/requests for orders to patient's Primary Care Physician - Dr Vonita Moss NICKOLETTE, ESPINOLA. ([id number]) Brion Aliment suggest currently that we continue with the above wound care measures for the next week. Patient's daughter as well caregiver are in agreement with plan. Please see above for specific wound care orders. We will see patient for re-evaluation in 2 week(s) here in the clinic. If anything worsens or changes patient will contact our office for additional recommendations. Electronic Signature(s) Signed: 08/25/2018 12:54:11 AM By: Lenda Kelp PA-C Entered By: Lenda Kelp on 08/24/2018 11:13:54 [name] ([id number]) -------------------------------------------------------------------------------- ROS/PFSH Details Patient Name: [name] Date of Service: 08/24/2018 10:00 AM Medical Record Number: [id number] Patient Account Number: [id number] Date of Birth/Sex: 14-Feb-1923 (84 y.o. F) Treating RN: Curtis Sites Primary Care Provider: Vonita Moss Other Clinician: Referring Provider: Vonita Moss Treating Provider/Extender: Linwood Dibbles, HOYT Weeks in Treatment: 22 Unable to Obtain Patient History due to oo Altered Mental Status Information Obtained From Patient Wound History Do you currently have one or more open woundso Yes How many open wounds do you currently haveo 1 Approximately how long have you had your woundso 1 week How have you been treating your wound(s) until nowo santyl  and gauze Has your wound(s) ever healed and then re-openedo No Have you had any lab work done in the past montho No Have you tested positive for an antibiotic resistant organism (MRSA, VRE)o No Have you tested positive for osteomyelitis (bone infection)o No Have you had any tests for circulation on your legso No Constitutional Symptoms (General Health) Complaints and Symptoms: Negative for: Fever; Chills Eyes Medical History: Negative for: Cataracts; Glaucoma; Optic Neuritis Ear/Nose/Mouth/Throat Medical History: Negative for: Chronic sinus problems/congestion; Middle ear problems Past Medical History Notes: dysphagia Hematologic/Lymphatic Complaints and Symptoms: No Complaints or Symptoms Medical History: Positive for: Anemia Negative for: Hemophilia; Human Immunodeficiency Virus; Lymphedema; Sickle Cell Disease Respiratory Complaints and Symptoms: No Complaints or Symptoms Medical History: Negative for: Aspiration; Asthma; Chronic Obstructive Pulmonary Disease (COPD); Pneumothorax; Sleep Apnea; ALEKSA, COLLINSWORTH. ([id number]) Tuberculosis Cardiovascular Complaints and Symptoms: No Complaints or Symptoms Medical History: Positive for: Hypertension Negative for: Angina; Arrhythmia; Congestive Heart Failure; Coronary Artery Disease; Deep Vein Thrombosis; Hypotension; Myocardial Infarction; Peripheral Arterial Disease; Peripheral Venous Disease; Phlebitis; Vasculitis Gastrointestinal Medical History: Negative for: Cirrhosis ; Colitis; Crohnos; Hepatitis A; Hepatitis B; Hepatitis C Endocrine Medical History: Negative for: Type I Diabetes; Type II Diabetes Genitourinary Medical History: Negative for: End Stage Renal Disease Immunological Medical History: Negative for: Lupus Erythematosus; Raynaudos; Scleroderma Integumentary (Skin) Medical History: Negative for: History of Burn; History of pressure wounds Musculoskeletal Medical History: Negative for: Gout; Rheumatoid  Arthritis; Osteoarthritis; Osteomyelitis Neurologic Medical History: Positive for: Dementia Negative for: Neuropathy; Quadriplegia; Paraplegia; Seizure Disorder Past Medical History Notes: parkinsons, tremor Oncologic Medical History: Negative for: Received Chemotherapy; Received Radiation Psychiatric Complaints and Symptoms: No Complaints or Symptoms KYLEEANN, CREMEANS ([id number]) Medical History: Negative for: Anorexia/bulimia; Confinement Anxiety Immunizations Pneumococcal Vaccine: Received Pneumococcal Vaccination: Yes Immunization Notes: up to date Implantable Devices Family and Social History Never smoker; Marital Status - Married; Alcohol Use: Never; Drug Use: No History; Caffeine Use: Never; Financial Concerns: No; Food, Clothing or Shelter Needs: No; Support System Lacking: No; Transportation Concerns: No; Advanced Directives: Yes (Not Provided); Patient does not want information on Advanced Directives; Medical Power of Attorney: Yes - dtr (Not Provided) Physician Affirmation I have reviewed and agree with the above information. Electronic Signature(s) Signed: 08/24/2018  5:02:25 PM By: Curtis Sites Signed: 08/25/2018 12:54:11 AM By: Lenda Kelp PA-C Entered By: Lenda Kelp on 08/24/2018 11:12:46 [name] ([id number]) -------------------------------------------------------------------------------- SuperBill Details Patient Name: [name] Date of Service: 08/24/2018 Medical Record Number: [id number] Patient Account Number: [id number] Date of Birth/Sex: 12/13/1922 (84 y.o. F) Treating RN: Curtis Sites Primary Care Provider: Vonita Moss Other Clinician: Referring Provider: Vonita Moss Treating Provider/Extender: Linwood Dibbles, HOYT Weeks in Treatment: 13 Diagnosis Coding ICD-10 Codes Code Description L89.153 Pressure ulcer of sacral region, stage 3 L89.023 Pressure ulcer of left elbow, stage 3 G20 Parkinson's disease L03.312  Cellulitis of back [any part except buttock] S81.811D Laceration without foreign body, right lower leg, subsequent encounter Facility Procedures CPT4 Code: [id number] Description: 99213 - WOUND CARE VISIT-LEV 3 EST PT Modifier: Quantity: 1 Physician Procedures CPT4 Code: 5784696 Description: 99214 - WC PHYS LEVEL 4 - EST PT ICD-10 Diagnosis Description L89.153 Pressure ulcer of sacral region, stage 3 L89.023 Pressure ulcer of left elbow, stage 3 G20 Parkinson's disease L03.312 Cellulitis of back [any part except buttock] Modifier: Quantity: 1 Electronic Signature(s) Signed: 08/25/2018 12:54:11 AM By: Lenda Kelp PA-C Entered By: Lenda Kelp on 08/24/2018 11:14:14

## 2018-09-07 ENCOUNTER — Encounter: Payer: Medicare Other | Attending: Physician Assistant | Admitting: Physician Assistant

## 2018-09-07 DIAGNOSIS — G20 Parkinson's disease: Secondary | ICD-10-CM | POA: Diagnosis not present

## 2018-09-07 DIAGNOSIS — S81811D Laceration without foreign body, right lower leg, subsequent encounter: Secondary | ICD-10-CM | POA: Insufficient documentation

## 2018-09-07 DIAGNOSIS — X58XXXD Exposure to other specified factors, subsequent encounter: Secondary | ICD-10-CM | POA: Diagnosis not present

## 2018-09-07 DIAGNOSIS — L89153 Pressure ulcer of sacral region, stage 3: Secondary | ICD-10-CM | POA: Diagnosis not present

## 2018-09-07 DIAGNOSIS — L03312 Cellulitis of back [any part except buttock]: Secondary | ICD-10-CM | POA: Insufficient documentation

## 2018-09-07 DIAGNOSIS — L89023 Pressure ulcer of left elbow, stage 3: Secondary | ICD-10-CM | POA: Diagnosis not present

## 2018-09-07 DIAGNOSIS — F0280 Dementia in other diseases classified elsewhere without behavioral disturbance: Secondary | ICD-10-CM | POA: Diagnosis not present

## 2018-09-12 NOTE — Progress Notes (Signed)
ADLENE, ADDUCI ([id number]) Visit Report for 09/07/2018 Arrival Information Details Patient Name: Tina, [name] Date of Service: 09/07/2018 10:00 AM Medical Record Number: [id number] Patient Account Number: [id number] Date of Birth/Sex: 08-09-1923 (84 y.o. F) Treating RN: Tina Sites Primary Care [name]: [name] Other Clinician: Referring [name]: [name] Treating [name]/Extender: [name] in [name]: 85 Visit Information History Since Last Visit Added or deleted any medications: No Patient Arrived: Wheel Chair Any new allergies or adverse reactions: No Arrival Time: 09:41 Had a fall or experienced change in No Accompanied By: daughter and activities of daily living that may affect caregiver risk of falls: Transfer Assistance: Manual Signs or symptoms of abuse/neglect since last visito No Patient Identification Verified: Yes Hospitalized since last visit: No Secondary Verification Process Yes Implantable device outside of the clinic excluding No Completed: cellular tissue based products placed in the center Patient Requires Transmission-Based No since last visit: Precautions: Has Dressing in Place as Prescribed: Yes Patient Has Alerts: No Pain Present Now: No Electronic Signature(s) Signed: 09/07/2018 1:28:07 PM By: [name] Entered By: Tina Martes on 09/07/2018 09:55:03 [name] ([id number]) -------------------------------------------------------------------------------- Clinic Level of Care Assessment Details Patient Name: [name] Date of Service: 09/07/2018 10:00 AM Medical Record Number: [id number] Patient Account Number: [id number] Date of Birth/Sex: 1923/03/17 (84 y.o. F) Treating RN: Tina Sites Primary Care Keanu Lesniak: [name] Other Clinician: Referring [name]: [name] Treating [name]/Extender: [name] in [name]:  38 Clinic Level of Care Assessment Items TOOL 4 Quantity Score []  - Use when only an EandM is performed on FOLLOW-UP visit 0 ASSESSMENTS - Nursing Assessment / Reassessment X - Reassessment of Co-morbidities (includes updates in patient status) 1 10 X- 1 5 Reassessment of Adherence to [name] Plan ASSESSMENTS - Wound and Skin Assessment / Reassessment []  - Simple Wound Assessment / Reassessment - one wound 0 X- 2 5 Complex Wound Assessment / Reassessment - multiple wounds []  - 0 Dermatologic / Skin Assessment (not related to wound area) ASSESSMENTS - Focused Assessment []  - Circumferential Edema Measurements - multi extremities 0 []  - 0 Nutritional Assessment / Counseling / Intervention []  - 0 Lower Extremity Assessment (monofilament, tuning fork, pulses) []  - 0 Peripheral Arterial Disease Assessment (using hand held doppler) ASSESSMENTS - Ostomy and/or Continence Assessment and Care []  - Incontinence Assessment and Management 0 []  - 0 Ostomy Care Assessment and Management (repouching, etc.) PROCESS - Coordination of Care X - Simple Patient / Family Education for ongoing care 1 15 []  - 0 Complex (extensive) Patient / Family Education for ongoing care []  - 0 Staff obtains Chiropractor, Records, Test Results / Process Orders []  - 0 Staff telephones HHA, Nursing Homes / Clarify orders / etc []  - 0 Routine Transfer to another Facility (non-emergent condition) []  - 0 Routine Hospital Admission (non-emergent condition) []  - 0 New Admissions / Manufacturing engineer / Ordering NPWT, Apligraf, etc. []  - 0 Emergency Hospital Admission (emergent condition) X- 1 10 Simple Discharge Coordination LORALYE, LOBERG. ([id number]) []  - 0 Complex (extensive) Discharge Coordination PROCESS - Special Needs []  - Pediatric / Minor Patient Management 0 []  - 0 Isolation Patient Management []  - 0 Hearing / Language / Visual special needs []  - 0 Assessment of Community assistance  (transportation, D/C planning, etc.) []  - 0 Additional assistance / Altered mentation []  - 0 Support Surface(s) Assessment (bed, cushion, seat, etc.) INTERVENTIONS - Wound Cleansing / Measurement []  - Simple Wound Cleansing - one wound  0 X- 2 5 Complex Wound Cleansing - multiple wounds X- 1 5 Wound Imaging (photographs - any number of wounds) []  - 0 Wound Tracing (instead of photographs) []  - 0 Simple Wound Measurement - one wound X- 2 5 Complex Wound Measurement - multiple wounds INTERVENTIONS - Wound Dressings X - Small Wound Dressing one or multiple wounds 2 10 []  - 0 Medium Wound Dressing one or multiple wounds []  - 0 Large Wound Dressing one or multiple wounds []  - 0 Application of Medications - topical []  - 0 Application of Medications - injection INTERVENTIONS - Miscellaneous []  - External ear exam 0 []  - 0 Specimen Collection (cultures, biopsies, blood, body fluids, etc.) []  - 0 Specimen(s) / Culture(s) sent or taken to Lab for analysis []  - 0 Patient Transfer (multiple staff / Nurse, adultHoyer Lift / Similar devices) []  - 0 Simple Staple / Suture removal (25 or less) []  - 0 Complex Staple / Suture removal (26 or more) []  - 0 Hypo / Hyperglycemic Management (close monitor of Blood Glucose) []  - 0 Ankle / Brachial Index (ABI) - do not check if billed separately X- 1 5 Vital Signs [name], [name] ([id number]) Has the patient been seen at the hospital within the last three years: Yes Total Score: 100 Level Of Care: New/Established - Level 3 Electronic Signature(s) Signed: 09/07/2018 5:28:58 PM By: [name], Joanna Entered By: [name], Joanna on 09/07/2018 10:42:40 Tina MainlandSHAW, [name] ([id number]) -------------------------------------------------------------------------------- Encounter Discharge Information Details Patient Name: Tina MainlandSHAW, [name] Date of Service: 09/07/2018 10:00 AM Medical Record Number: [id number] Patient Account Number: [id number] Date of Birth/Sex:  [birth year] (84 y.o. F) Treating RN: [name], Joanna Primary Care Phyllistine Domingos: Tina MossRISSMAN, MARK Other Clinician: Referring Beaumont Austad: Tina MossRISSMAN, MARK Treating Lissandro Dilorenzo/Extender: Tina DibblesSTONE III, [name]: [id number] Encounter Discharge Information Items Discharge Condition: Stable Ambulatory Status: Wheelchair Discharge Destination: Home Transportation: Private Auto Accompanied By: daughter and caregiver Schedule Follow-up Appointment: Yes Clinical Summary of Care: Electronic Signature(s) Signed: 09/07/2018 5:28:58 PM By: [name], Joanna Entered By: [name], Joanna on 09/07/2018 10:42:58 Tina MainlandSHAW, [name] ([id number]) -------------------------------------------------------------------------------- Lower Extremity Assessment Details Patient Name: Tina MainlandSHAW, [name] Date of Service: 09/07/2018 10:00 AM Medical Record Number: [id number] Patient Account Number: [id number] Date of Birth/Sex: [birth year] (84 y.o. F) Treating RN: Arnette NorrisBiell, Kristina Primary Care Bethanee Redondo: Tina MossRISSMAN, MARK Other Clinician: Referring Skylynne Schlechter: Tina MossRISSMAN, MARK Treating Fritzie Prioleau/Extender: Tina DibblesSTONE III, [name]: [id number] Electronic Signature(s) Signed: 09/07/2018 5:05:35 PM By: Arnette NorrisBiell, Kristina Entered By: Arnette NorrisBiell, Kristina on 09/07/2018 10:23:51 Tina MainlandSHAW, [name] ([id number]) -------------------------------------------------------------------------------- Multi Wound Chart Details Patient Name: Tina MainlandSHAW, [name] Date of Service: 09/07/2018 10:00 AM Medical Record Number: [id number] Patient Account Number: [id number] Date of Birth/Sex: [birth year] (84 y.o. F) Treating RN: [name], Joanna Primary Care Idara Woodside: Tina MossRISSMAN, MARK Other Clinician: Referring Hideko Esselman: Tina MossRISSMAN, MARK Treating Octavia Velador/Extender: Tina DibblesSTONE III, [name]: 86 Vital Signs Height(in): 60 Pulse(bpm): 72 Weight(lbs): 90 Blood Pressure(mmHg): 106/62 Body Mass Index(BMI): 18 Temperature(F): 97.8 Respiratory  Rate 16 (breaths/min): Photos: [1:No Photos] [13:No Photos] [N/A:N/A] Wound Location: [1:Coccyx - Midline] [13:Left Shoulder] [N/A:N/A] Wounding Event: [1:Pressure Injury] [13:Pressure Injury] [N/A:N/A] Primary Etiology: [1:Pressure Ulcer] [13:Pressure Ulcer] [N/A:N/A] Comorbid History: [1:Anemia, Hypertension, Dementia] [13:Anemia, Hypertension, Dementia] [N/A:N/A] Date Acquired: [1:12/01/2016] [13:05/24/2018] [N/A:N/A] [name] of [name]: [1:86] [13:2] [N/A:N/A] Wound Status: [1:Open] [13:Open] [N/A:N/A] Measurements L x W x D [1:0.1x0.1x0.1] [13:0.1x0.1x0.1] [N/A:N/A] (cm) Area (cm) : [1:0.008] [13:0.008] [N/A:N/A] Volume (cm) : [1:0.001] [13:0.001] [N/A:N/A] % Reduction in Area: [1:99.80%] [13:99.70%] [N/A:N/A] % Reduction in Volume: [1:99.90%] [13:99.60%] [N/A:N/A] Classification: [1:Category/Stage IV] [13:Category/Stage I] [N/A:N/A] Exudate Amount: [  1:None Present] [13:None Present] [N/A:N/A] Wound Margin: [1:Epibole] [13:Flat and Intact] [N/A:N/A] Granulation Amount: [1:Large (67-100%)] [13:None Present (0%)] [N/A:N/A] Granulation Quality: [1:Red] [13:N/A] [N/A:N/A] Necrotic Amount: [1:None Present (0%)] [13:None Present (0%)] [N/A:N/A] Exposed Structures: [1:Fascia: No Fat Layer (Subcutaneous Tissue) Exposed: No Tendon: No Muscle: No Joint: No Bone: No] [13:Fascia: No Fat Layer (Subcutaneous Tissue) Exposed: No Tendon: No Muscle: No Joint: No Bone: No] [N/A:N/A] Epithelialization: [1:None] [13:None] [N/A:N/A] Periwound Skin Texture: [1:Rash: Yes Scarring: Yes Excoriation: No Induration: No Callus: No Crepitus: No] [13:Excoriation: No Induration: No Callus: No Crepitus: No Rash: No Scarring: No] [N/A:N/A] Periwound Skin Moisture: [1:Maceration: No Dry/Scaly: No] [13:Dry/Scaly: Yes Maceration: No] [N/A:N/A] Periwound Skin Color: Erythema: Yes Erythema: Yes N/A Atrophie Blanche: No Atrophie Blanche: No Cyanosis: No Cyanosis: No Ecchymosis: No Ecchymosis: No Hemosiderin  Staining: No Hemosiderin Staining: No Mottled: No Mottled: No Pallor: No Pallor: No Rubor: No Rubor: No Temperature: No Abnormality No Abnormality N/A Tenderness on Palpation: No Yes N/A Wound Preparation: Ulcer Cleansing: Ulcer Cleansing: Wound N/A Rinsed/Irrigated with Saline Cleanser Topical Anesthetic Applied: Topical Anesthetic Applied: Xylocaine 4% Topical Solution, None None [name] Notes Electronic Signature(s) Signed: 09/07/2018 5:28:58 PM By: Tina Sites Entered By: Tina Sites on 09/07/2018 10:35:03 [name] ([id number]) -------------------------------------------------------------------------------- Multi-Disciplinary Care Plan Details Patient Name: [name] Date of Service: 09/07/2018 10:00 AM Medical Record Number: [id number] Patient Account Number: [id number] Date of Birth/Sex: Aug 26, 1923 (84 y.o. F) Treating RN: Tina Sites Primary Care Albino Bufford: [name] Other Clinician: Referring Laquenta Whitsell: [name] Treating Kewanna Kasprzak/Extender: [name] in [name]: 54 Active Inactive Abuse / Safety / Falls / Self Care Management Nursing Diagnoses: Impaired physical mobility Potential for falls Goals: Patient will remain injury free Date Initiated: 01/07/2017 Target Resolution Date: 04/03/2017 Goal Status: Active Interventions: Assess fall risk on admission and as needed Notes: Nutrition Nursing Diagnoses: Potential for alteratiion in Nutrition/Potential for imbalanced nutrition Goals: Patient/caregiver agrees to and verbalizes understanding of need to use nutritional supplements and/or vitamins as prescribed Date Initiated: 01/07/2017 Target Resolution Date: 04/03/2017 Goal Status: Active Interventions: Assess patient nutrition upon admission and as needed per policy Notes: Orientation to the Wound Care Program Nursing Diagnoses: Knowledge deficit related to the wound healing center  program Goals: Patient/caregiver will verbalize understanding of the Wound Healing Center Program Date Initiated: 01/07/2017 Target Resolution Date: 04/03/2017 Goal Status: Active Interventions: HARLEEN, FINEBERG ([id number]) Provide education on orientation to the wound center Notes: Pressure Nursing Diagnoses: Knowledge deficit related to causes and risk factors for pressure ulcer development Goals: Patient will remain free from development of additional pressure ulcers Date Initiated: 01/07/2017 Target Resolution Date: 04/03/2017 Goal Status: Active Interventions: Assess potential for pressure ulcer upon admission and as needed Notes: Wound/Skin Impairment Nursing Diagnoses: Impaired tissue integrity Goals: Patient/caregiver will verbalize understanding of skin care regimen Date Initiated: 01/07/2017 Target Resolution Date: 04/03/2017 Goal Status: Active Ulcer/skin breakdown will have a volume reduction of 30% by week 4 Date Initiated: 01/07/2017 Target Resolution Date: 04/03/2017 Goal Status: Active Ulcer/skin breakdown will have a volume reduction of 50% by week 8 Date Initiated: 01/07/2017 Target Resolution Date: 04/03/2017 Goal Status: Active Ulcer/skin breakdown will have a volume reduction of 80% by week 12 Date Initiated: 01/07/2017 Target Resolution Date: 04/03/2017 Goal Status: Active Ulcer/skin breakdown will heal within 14 [name] Date Initiated: 01/07/2017 Target Resolution Date: 04/03/2017 Goal Status: Active Interventions: Assess patient/caregiver ability to obtain necessary supplies Assess patient/caregiver ability to perform ulcer/skin care regimen upon admission and as needed Assess ulceration(s) every visit Notes: Electronic Signature(s)  Signed: 09/07/2018 5:28:58 PM By: Tina Sites Entered By: Tina Sites on 09/07/2018 10:34:50 [name] ([id number]) [name], [name]  ([id number]) -------------------------------------------------------------------------------- Pain Assessment Details Patient Name: [name] Date of Service: 09/07/2018 10:00 AM Medical Record Number: [id number] Patient Account Number: [id number] Date of Birth/Sex: [birth year] (84 y.o. F) Treating RN: Tina Sites Primary Care Kobie Matkins: [name] Other Clinician: Referring Dashun Borre: [name] Treating Frannie Shedrick/Extender: [name] in [name]: 65 Active Problems Location of Pain Severity and Description of Pain Patient Has Paino No Site Locations Pain Management and Medication Current Pain Management: Electronic Signature(s) Signed: 09/07/2018 1:28:07 PM By: [name] Signed: 09/07/2018 5:28:58 PM By: Tina Sites Entered By: Tina Martes on 09/07/2018 09:55:14 [name] ([id number]) -------------------------------------------------------------------------------- Patient/Caregiver Education Details Patient Name: [name] Date of Service: 09/07/2018 10:00 AM Medical Record Number: [id number] Patient Account Number: [id number] Date of Birth/Gender: 01/22/1923 (84 y.o. F) Treating RN: Tina Sites Primary Care Physician: [name] Other Clinician: Referring Physician: Vonita [name] Treating Physician/Extender: Skeet Simmer in [name]: 80 Education Assessment Education Provided To: Caregiver dtr and cg Education Topics Provided Wound/Skin Impairment: Handouts: Other: wound care as ordered Electronic Signature(s) Signed: 09/07/2018 5:28:58 PM By: Tina Sites Entered By: Tina Sites on 09/07/2018 10:35:28 [name] ([id number]) -------------------------------------------------------------------------------- Wound Assessment Details Patient Name: [name] Date of Service: 09/07/2018 10:00 AM Medical Record Number: [id number] Patient Account  Number: [id number] Date of Birth/Sex: 07-03-1923 (84 y.o. F) Treating RN: Tina Sites Primary Care Yaphet Smethurst: [name] Other Clinician: Referring Kyshon Tolliver: [name] Treating Lopez Dentinger/Extender: [name] in [name]: 86 Wound Status Wound Number: 1 Primary Etiology: Pressure Ulcer Wound Location: Midline Coccyx Wound Status: Healed - Epithelialized Wounding Event: Pressure Injury Comorbid History: Anemia, Hypertension, Dementia Date Acquired: 12/01/2016 [name] Of [name]: 86 Clustered Wound: No Photos Photo Uploaded By: Arnette Norris on 09/07/2018 11:50:05 Wound Measurements Length: (cm) 0 % Re Width: (cm) 0 % Re Depth: (cm) 0 Epit Area: (cm) 0 Tun Volume: (cm) 0 Und duction in Area: 100% duction in Volume: 100% helialization: None neling: No ermining: No Wound Description Classification: Category/Stage IV Wound Margin: Epibole Exudate Amount: None Present Foul Odor After Cleansing: No Slough/Fibrino No Wound Bed Granulation Amount: Large (67-100%) Exposed Structure Granulation Quality: Red Fascia Exposed: No Necrotic Amount: None Present (0%) Fat Layer (Subcutaneous Tissue) Exposed: No Tendon Exposed: No Muscle Exposed: No Joint Exposed: No Bone Exposed: No Periwound Skin Texture Texture Color No Abnormalities Noted: No No Abnormalities Noted: No Tina, [name] ([id number]) Callus: No Atrophie Blanche: No Crepitus: No Cyanosis: No Excoriation: No Ecchymosis: No Induration: No Erythema: Yes Rash: Yes Hemosiderin Staining: No Scarring: Yes Mottled: No Pallor: No Moisture Rubor: No No Abnormalities Noted: No Dry / Scaly: No Temperature / Pain Maceration: No Temperature: No Abnormality Wound Preparation Ulcer Cleansing: Rinsed/Irrigated with Saline Topical Anesthetic Applied: None, Xylocaine 4% Topical Solution [name] Notes Wound #1 (Midline Coccyx) Notes shoulder - gentamicin cream and bordered foam dressing,  coccyx - bordered foam dressing Electronic Signature(s) Signed: 09/07/2018 5:28:58 PM By: Tina Sites Entered By: Tina Sites on 09/07/2018 10:50:20 [name] ([id number]) -------------------------------------------------------------------------------- Wound Assessment Details Patient Name: [name] Date of Service: 09/07/2018 10:00 AM Medical Record Number: [id number] Patient Account Number: [id number] Date of Birth/Sex: [birth year] (84 y.o. F) Treating RN: Tina Sites Primary Care Taisei Bonnette: [name] Other Clinician: Referring Javeria Briski: [name] Treating Boluwatife Flight/Extender: STONE III, [name]: 86 Wound Status Wound Number: 13  Primary Etiology: Pressure Ulcer Wound Location: Left Shoulder Wound Status: Healed - Epithelialized Wounding Event: Pressure Injury Comorbid History: Anemia, Hypertension, Dementia Date Acquired: 05/24/2018 [name] Of [name]: 2 Clustered Wound: No Photos Photo Uploaded By: Arnette Norris on 09/07/2018 11:50:06 Wound Measurements Length: (cm) 0 % Re Width: (cm) 0 % Re Depth: (cm) 0 Epit Area: (cm) 0 Tun Volume: (cm) 0 Und duction in Area: 100% duction in Volume: 100% helialization: None neling: No ermining: No Wound Description Classification: Category/Stage I Wound Margin: Flat and Intact Exudate Amount: None Present Foul Odor After Cleansing: No Slough/Fibrino No Wound Bed Granulation Amount: None Present (0%) Exposed Structure Necrotic Amount: None Present (0%) Fascia Exposed: No Fat Layer (Subcutaneous Tissue) Exposed: No Tendon Exposed: No Muscle Exposed: No Joint Exposed: No Bone Exposed: No Periwound Skin Texture Texture Color No Abnormalities Noted: No No Abnormalities Noted: No Tina, [name] ([id number]) Callus: No Atrophie Blanche: No Crepitus: No Cyanosis: No Excoriation: No Ecchymosis: No Induration: No Erythema: Yes Rash: No Hemosiderin Staining:  No Scarring: No Mottled: No Pallor: No Moisture Rubor: No No Abnormalities Noted: No Dry / Scaly: Yes Temperature / Pain Maceration: No Temperature: No Abnormality Tenderness on Palpation: Yes Wound Preparation Ulcer Cleansing: Wound Cleanser Topical Anesthetic Applied: None [name] Notes Wound #13 (Left Shoulder) Notes shoulder - gentamicin cream and bordered foam dressing, coccyx - bordered foam dressing Electronic Signature(s) Signed: 09/07/2018 5:28:58 PM By: Tina Sites Entered By: Tina Sites on 09/07/2018 10:50:21 [name] ([id number]) -------------------------------------------------------------------------------- Vitals Details Patient Name: [name] Date of Service: 09/07/2018 10:00 AM Medical Record Number: [id number] Patient Account Number: [id number] Date of Birth/Sex: 07-20-1923 (84 y.o. F) Treating RN: Tina Sites Primary Care Brailynn Breth: [name] Other Clinician: Referring Rian Busche: [name] Treating Alexandre Faries/Extender: [name] in [name]: 89 Vital Signs Time Taken: 09:55 Temperature (F): 97.8 Height (in): 60 Pulse (bpm): 72 Weight (lbs): 90 Respiratory Rate (breaths/min): 16 Body Mass Index (BMI): 17.6 Blood Pressure (mmHg): 106/62 Reference Range: 80 - 120 mg / dl Electronic Signature(s) Signed: 09/07/2018 1:28:07 PM By: [name] Entered By: Tina Martes on 09/07/2018 09:56:11

## 2018-09-12 NOTE — Progress Notes (Signed)
[name], [name] ([id number]) Visit Report for 09/07/2018 Chief Complaint Document Details Patient Name: [name], [name] Date of Service: 09/07/2018 10:00 AM Medical Record Number: [id number] Patient Account Number: [id number] Date of Birth/Sex: 08-19-1923 (84 y.o. F) Treating RN: Curtis Sites Primary Care Provider: Vonita Moss Other Clinician: Referring Provider: Vonita Moss Treating Provider/Extender: Linwood Dibbles, HOYT Weeks in Treatment: 35 Information Obtained from: Patient Chief Complaint multiple wounds/ulcers Electronic Signature(s) Signed: 09/10/2018 8:01:46 PM By: Lenda Kelp PA-C Entered By: Lenda Kelp on 09/07/2018 10:00:42 [name] ([id number]) -------------------------------------------------------------------------------- HPI Details Patient Name: [name] Date of Service: 09/07/2018 10:00 AM Medical Record Number: [id number] Patient Account Number: [id number] Date of Birth/Sex: Oct 02, 1922 (84 y.o. F) Treating RN: Curtis Sites Primary Care Provider: Vonita Moss Other Clinician: Referring Provider: Vonita Moss Treating Provider/Extender: Linwood Dibbles, HOYT Weeks in Treatment: 30 History of Present Illness HPI Description: 01/07/17 this is a 84 year old woman admitted to the clinic today for review of a pressure ulcer on her lower sacrum. She is referred from her primary physician's office after being seen on 3/22 with a 3 cm pressure area. Her daughter and caretaker accompanied her today state that the area first became obvious about a month ago and his since deteriorated. They have recently got [name] home health involved and have been using [name] to the wound. They have ordered a pressure relief surface for her mattress. They are turning her religiously. They state that she eats well and they've been forcing fluids on her. She is on a multivitamin. Looking through [facility] point last albumin I see was 4.4 on 10/28. The  patient has advanced parkinsonism which looks superficially like advanced Parkinson's disease although her daughter tells me she did not ever respond to Sinemet therefore this may have another pathology with signs of parkinsonism. However I think this is largely a mute point currently. She also has dementia and is nonambulatory. Since this started they have been keeping her in bed and turning her religiously every 2 hours. She lives at home in [place] with her husband with 24/7 care giving 01/13/17 santyl change qd. Still will require further debridement. continue santyl. 01/20/17; patient's wound actually looks some better less adherent necrotic surface. There is actually visible granulation. We're using [name] 01/27/17; better-looking surface but still a lot of necrotic tissue on the base of this wound. The periwound erythema is better than last week we are still using [name]. Her daughter tells [place] that she is still having trouble with the pressure-relief mattress through medical modalities 02/03/18; I had the patient scheduled for a two week followup however her daughter brought her in early concerned for discoloration on 2 areas of the wound circumference. We have bee using santyl 02/10/17;Better looking surface to the wound. Rim appears better suggesting better offloading. Using santyl 02/24/17; change to Silver Collegen last time. Wound appears better. 03/10/17; still using silver collagen religious offloading. Intake is satisfactory per her daughter. Dimension slightly better 03/12/2017 -- Dr. Jannetta Quint patient who had been seen 2 days ago and was doing fairly well. The patient is brought in by her daughter who noticed a new wound just above the previous wound on her sacral area and going on more to the left lateral side. She was very concerned and we asked her to get in for an opinion. 03/17/17; above is noted. The patient has developed a progressive area to the left of her original wound. This  seems to this started with a ring of red skin  with a more pale interior almost looking fungal. There was a rim of blister through part of the area although this did not look like zoster. They have been applying triamcinolone that was prescribed last week by [name] and the area has a fold to a linear band area which is confluent, erythematous and with obvious epidermal swelling but there is no overt tenderness or crepitus. She has lost some surface epithelium closer to the wound surface itself and now has a more superficial wound in this area and the extending erythema goes towards the left buttock. This is well demarcated between involved in normal skin but once again does not appear to be at all tender. If there is a contact issue here I cannot get the history out of the daughter or the caregiver that are with her. 03/24/17; the patient arrives today with the wound slightly worse slightly more drainage. The bandlike area of erythema that I treated as a possible pineal infection has improved somewhat although proximally is still has confluent erythema without overt tenderness. We have been using silver alginate since the most recent deterioration. To the bandlike degree of erythema we have been using Lotrisone cream 03/31/17; patient arrives today with the wound slightly larger, necrotic surface and surrounding erythema. The bandlike area of erythema that I treated as a possible pineal infection is less swollen but still present I've been using Lotrisone cream on that largely related to the presence of a tinea looking infection when this was first seen. We've been using silver alginate. X-ray that I ordered last week showed no acute bony abnormalities mild fecal impaction. Lab work showed a comprehensive metabolic panel that was normal including an albumin of 3.8. White count was 9.5 hemoglobin 12.3 differential count normal. C-reactive protein was less than 1 and sedimentation rate and 17. The  latter 2 values does not support an ongoing bacterial infection. 04/14/17; patient arrives after a 2 week hiatus. Her wound is not in good condition. Although the base of the wound looks Tina MainlandSHAW, Dominik W. ([id number]) stable she still has an erythematous area that was apparently blistered over the weekend. This again points to the left. As our intake nurse pointed out today this is in the area where the tissue folds together and we may need to prevent try to prevent this. Lab work and x-ray that I did to 3 weeks ago were unremarkable including her albumin nevertheless she is an extremely frail condition physically. We have have been using [name] 04/22/17; I changed her to silver alginate because of the surrounding maceration and moisture last week. The daughter did not like the way the wound looked in the middle of the week and changed her back to MelletteSantyl. They're putting gauze on top of this. Thinks still using Lotrisone. 05/06/17 on evaluation today patient sacral wound appears to be doing okay and does not seem to be any worse. She is having no significant pain during evaluation today the secondary to mental status she is unable to rate or describe whether she had any pain she was not however flinching. Her daughter states that the wound does appear to be looking better to her. Still we are having difficulty with the skinfold that seems to be closing in on itself at this point. All in all I feel like she is making some good progress in the [name] seems to be the official for her. They do tell me that a refill if we are gonna continue that today. No fevers, chills, nausea,  or vomiting noted at this time. 05/13/17 presents today for evaluation concerning her ongoing sacral pressure ulcer. Unfortunately she also has an area of deep tissue injury in the right Ischial region which is starting to show up. The sacral wound also continues to show signs of necrotic tissue overlying and has declined. Overall we  really have not seen a significant improvement in the past several months in regard to the sacral wound and now patient is starting to develop a new wound in the right Ischial region. Obviously this is not trending in the direction that we want to see. No fevers, chills, nausea, or vomiting noted at this time. 05/19/17; I have not seen this wound and almost a month however there is nothing really positive to say about it. Necrotic tissue over the surface which superiorly I think abuts on her sacrum. She has surrounding erythema. I would be surprised if there is not underlying osteomyelitis or soft tissue infection. 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 She apparently eats well per description although I wonder about this looking at her. Lab work I did probably 4 weeks ago however was really quite normal including a serum albumin 05/26/17; culture I did last week grew Escherichia coli and methicillin sensitive staph aureus which should've been well covered by the Augmentin and ciprofloxacin. Indeed the erythema around the wound in the bed of the wound looks somewhat better. Her intake is still satisfactory. They now have a near fluidized bed 06/02/17; they completed the antibiotics last Friday. Using collagen. Daughter still reports eating and drinking well. There is less visualized erythema around the wound. 06/16/17; large pressure ulcer over her lower sacrum and coccyx. Using [name] to the wound bed. 06/30/17; certainly no change in dimensions of this large stage III wound over her sacrum and coccyx. They've been using [name]. There is no exposed bone. She has a candidal/tinea area in the right inguinal area. Other than that her daughter relates that she is eating and drinking well there changing her positioning to make sure the areas offloaded 07/14/17; no major change in the dimensions of this large stage III wound. Initially a smaller wound that became secondarily infected causing  significant tissue breakdown although it is been stable in the last several weeks. Tunneling superiorly at roughly 1:00 no change here either. There is no bone palpable. Both the patient's daughter and caretaker states that she eats well. I have not rechecked her blood work 07/27/17; patient arrives in clinic today and generally a deteriorated looking state. Mild fever with axillary temperature of 100.4. Daughter reports she has not been eating and drinking well since yesterday. She looks more pale and thin and less responsive. We have been using silver collagen to her wound 08/11/17; since the last time the patient was here things have gone better. Her fever went down and she started eating and drinking again. Culture I did of the wound showed methicillin sensitive staph aureus, Morganella and enterococcus. I only treated her with Keflex which would've not covered the Morganella and enterococcus however the purulent area on the 2:00 side of her wound is a lot better and the bandlike erythema that concern me also was resolved. I'd called the daughter last week to confirm that she was a lot better. She finished the Keflex last Thursday she also suffered a skin tear this morning perhaps while putting on her incontinence brief period is on the lateral aspect of her right leg. Clean wound with the surface epithelium not  viable. 08/25/17; last week the patient was noted to have erythema around the wound margin and a slight fever which the patient's daughter says was [id number]. Our office was contacted by home health however we did not have a space to work the patient in that she went to see her primary physician Dr. Maurice MarchLane. She was not febrile during this visit on 08/21/17 there was erythema around the wound similar to last occasion. Dr. Maurice MarchLane in reference to my culture from 07/28/17 and put her on doxycycline capsules which they're opening twice a day for 10 days. 09/17/17; patient arrives today with the wound  bed looking fairly well granulated. There is undermining from 4 to 6:00 although this seems to of contracted slightly. She does not have obvious infection although the daughter states there was some darkening of the wound circumference that is not evident today. They state she is eating well. They are concerned about oral thrush 09/30/16; very fibrin looking granulated wound bed. Her undermining from 4 to 6:00 is about the same but also appears to be well granulated. She has rolled edges of senescent tissue from roughly 7 to 12:00. There is no evidence of infection Tina MainlandSHAW, Bahja W. ([id number]) 10/13/17 on evaluation today patient appears to be doing fairly well all things considered in regard to her sacral wound. There's really not a lot of significant change or improvement she does have some evidence of contusion and deep tissue injury around the left border of the wound that patient's daughter did inquire about today. Nonetheless overall the wound appears to be doing about the same in my opinion. There is no significant indication of infection there also is no significant slough noted at this point. 10/27/17 She is here in follow up evaluation of a sacral ulcer. She is accompanied by her daughter and caregiver. There is red granulation tissue throughout, persistent discoloration to left border; this appears consistent with deep tissue injury. There are multiple areas covered in foam borders, tegaderm, etc that are "preventative" with "no wounds". We will continue with prisma and continue with two week follow ups 11/17/17 on evaluation today patient appears to be doing decently well in regard to her wound at this point. She continues to have a sacral wound ulcer. We see her roughly every two weeks. In the last week she was actually in the hospital due to what was diagnosed as sepsis although no organisms were ever identified in the actual blood cultures. The hospital really was not sure that the  wound was the cause of the infection but they really did not figure out anything else that would be a causative organism she did have a CT scan and that revealed no evidence of pneumonia there was also no evidence of urinary tract infection. Again it very well could have been the wound called in those although wound appears to be doing fairly well at this point. 12/15/17 on evaluation today patient actually appears to be doing about the same in regard to her sacral ulcer. The one thing different is that she does seem to have a rash where her right arm is contracted and being held to the thorax. The area underlying both on the ventral side of the arm as well is the thorax where it comes in contact shows evidence of a rash which appears to be fungal in nature. There does not appear to be any evidence of infection lies at this point. There does not appear to be a rash consistent with shingles which was  also of concern initially. No fevers, chills, nausea, or vomiting noted at this time. 12/29/17 on evaluation today patient appears to be doing a little better in regard to her sacral wound although the one changes the 12 o'clock location of the wound seems to have attached at one point which no longer allows this to pull back. This has caused an area of undermining that seems to be attaching as well in the 12 o'clock location. I do think that this is something that can be managed and is not necessarily a bad thing. Nonetheless she does seem to have some pain with exploration of this region of undermining. She continues to have an area under her right arm on the chest wall of erythema although this is a little better especially after the home health nurse is actually got in order for Diflucan times one for the patient. She is a new area on the left wrist that looks like because of the contraction her nail may have pushed in on her wrist area causing a slight cut which subsequently became infected. She has  some honey crusted drainage noted. 03/09/18 undervaluation today patient appears to be doing fairly well in regard to her wounds in general. She has been tolerating the dressing changes without complication. Overall I'm pleased with the progress that seems to be made week by week especially in regard to the sacral ulcer. Her daughter and the caregiver seem to take very good care of her. With that being said she has very little undermining in regard to the sacral wound in good granulation she also has good epithelialization noted. 03/23/18 on evaluation today patient appears to be doing rather well in regard to her sacral wound. Unfortunately she does have a little bit of necrotic tissue noted in the central portion of her wound on the left elbow. This is due to the fact that she is lying on this arm seeing how it is contracted. Currently her daughter has started to avoid lying on the side it all due to the fact that the necrotic tissue was noted. With that being said they have been taking very good care of her in my pinion. Fortunately there does not appear to be any evidence of infection which is good news. Overall I'm pleased with the progress she's made other than in regard to the elbow. 04/06/18 on evaluation today patient actually appears to be doing okay in regard to the sacral wound. In fact it appears to be somewhat smaller in general although there does appear to be more undermining at the 6 o'clock location than was previously noted. She has been tolerating the dressing changes without complication in general which is also good news. Nonetheless she does have a new small skin tear/opening on her leg which was evaluated today. Fortunately this appears to be minimal and the Xeroform gauze which has been used up to this point in the past has been utilized very effectively seems to be doing well in that regard. Her left elbow ulcer seems to also be doing excellent at this point making good  progress. 04/20/18 on evaluation today patient appears to be doing fairly well in regard to her sacral wound. This definitely seems to be better than during last evaluation where it was indeed infected. With that being said she has been tolerating the dressing changes as best it can be expected. Her elbow seems to be drying out and a lot of times the dressing is getting stuck according to her caregiver. Millis,  Myles Gip ([id number]) 05/04/18-She is seen in follow-up evaluation for a sacral and left elbow pressure ulcer. These are stable/improved and we will continue with same treatment plan and she will follow-up in 2 weeks 05/18/18 on evaluation today patient actually appears to be doing better in regard to her left elbow ulcer. The sacral wound is also shown signs of improvement which is good news. With that being said she does have a new right medial lower extremity ulcer which actually appears to show some signs of cellulitis/infection. She is previously taken Augmentin with good result. Nonetheless the patient really does not have anything that I can culture at the site there's actually some good epithelialization noted although again there is some cellulitis appearance as well. 06/01/18 on evaluation today patient appears to actually be doing very well in general in regard to her left elbow wound which is smaller in her lower extremity ulcer which is actually healed. With that being said the sacral wound in particular show signs of improvement although she still has undermining in the six-7 o'clock location. No fevers, chills, nausea, or vomiting noted at this time. 06/15/18 on evaluation today patient actually appears to be doing excellent in regard to her elbow ulcer on the left elbow. This is shown signs of great improvement and I do feel like she is very close to healing in this regard. In general her sacral wound also appears to be doing well there's good sign of improvement there as well as  far as the overall surface of the wound is concerned. She does have a significant area of tunneling at the 6 o'clock location that still is about the same really there's no significant improvement in that regard. Nonetheless we are attempting to try and treat this by way of packing with Prisma followed by silver so to help control moisture. The patient been tolerating the dressing changes without complication. 06/29/18 on evaluation today patient actually appears to be doing very well in regard to her left elbow ulcer and her sacral ulcer. She has been tolerating the dressing changes without complication which is good news. In fact the sacral wound appears to have almost completely closed although she does have an area of undermining/tunneling at roughly the 7 o'clock location that is still unfortunately having a more difficult time closing although as opposed to during the last visit I was unable to get a normal Q-tip down into the undermined region. Actually had to use a skinny probe or else the backside of the Q-tip were just a wooden stake was. I think this is a good sign that this area seems to be filling in which is wonderful. Overall I'm pleased with how things are progressing. 07/13/18 on evaluation today patient appears to be doing a little worse in regard to the overall appearance of the sacral wound. She has been tolerating the dressing changes without complication. With that being said the patient's daughter tells me that the home health nurse actually dug around it significantly in the 12 o'clock location of the wound and subsequently reopen this area which was very well healed previous. This was a new nurse that hasn't generally been coming out to see the patient. Nonetheless they are very upset about this and the fact that the wound was actually worse when she was done "taking". It does appear that the wound is recovering and in fact since that point has closed quite significantly  compared to what they tell me it looked like when she was done.  07/27/18 evaluation today patient appears to be doing well in regard to her elbow ulcer as well as the sacral ulcer. She has actually been doing excellent at this point in time. Fortunately there does not appear to be any evidence of infection currently. Overall I'm very happy with the progress at this time. She does have a couple other areas where there was some redness noted a fortunately there does not appear to be the evidence of more significant skin breakdown although there just slightly blanchable regions that I think do need to be watched carefully. 08/10/18 upon evaluation today patient actually appears to be showing signs of improvement at all sites evaluated today. This is even true in the sacral region where she has less expensive undermining although there is still undermining noted unfortunately. They're having a very hard time being able to pack anything into this area. She may benefit from utilizing gentamicin cream and just a cover dressing. 08/24/18 on evaluation today patient sacral ulcer actually appears to be almost completely healed. She has done extremely well in this regard and I think this is in most part due to her family who has been excellent in taking care of her as well as her caregiver. Overall I'm extremely pleased in this regard. Her left shoulder area did open up since the last visit where I saw her although this was something that we potentially expected. Fortunately it does not appear to show any signs of significant infection overall I think that an alginate dressing would be very well at the site. 09/07/18 on evaluation today the patient actually appears to be completely healed in regard to all of her wounds. There is currently nothing remaining open at this point. She has been tolerating the dressing changes without complication. Overall I'm very pleased with how things have progressed. Patient's  daughter is also extremely happy. Electronic Signature(s) SKYLEIGH, WINDLE ([id number]) Signed: 09/10/2018 8:01:46 PM By: Lenda Kelp PA-C Entered By: Lenda Kelp on 09/07/2018 11:11:36 [name] ([id number]) -------------------------------------------------------------------------------- Physical Exam Details Patient Name: [name] Date of Service: 09/07/2018 10:00 AM Medical Record Number: [id number] Patient Account Number: [id number] Date of Birth/Sex: 09-12-1923 (84 y.o. F) Treating RN: Curtis Sites Primary Care Provider: Vonita Moss Other Clinician: Referring Provider: Vonita Moss Treating Provider/Extender: STONE III, HOYT Weeks in Treatment: 55 Constitutional Well-nourished and well-hydrated in no acute distress. Respiratory normal breathing without difficulty. clear to auscultation bilaterally. Cardiovascular regular rate and rhythm with normal S1, S2. Psychiatric this patient is able to make decisions and demonstrates good insight into disease process. Alert and Oriented x 3. pleasant and cooperative. Notes Patient's wound bed currently shows complete epithelialization in all locations there's little bit of dry skin but no evidence of imminent breakdown at any site especially the sacral region. I do think this area needs to be protected to ensure that nothing reopens but other than that overall I'm very pleased with the progress. Electronic Signature(s) Signed: 09/10/2018 8:01:46 PM By: Lenda Kelp PA-C Entered By: Lenda Kelp on 09/07/2018 11:12:11 [name] ([id number]) -------------------------------------------------------------------------------- Physician Orders Details Patient Name: [name] Date of Service: 09/07/2018 10:00 AM Medical Record Number: [id number] Patient Account Number: [id number] Date of Birth/Sex: Feb 20, 1923 (84 y.o. F) Treating RN: Curtis Sites Primary Care Provider: Vonita Moss Other  Clinician: Referring Provider: Vonita Moss Treating Provider/Extender: Linwood Dibbles, HOYT Weeks in Treatment: 32 Verbal / Phone Orders: No Diagnosis Coding ICD-10 Coding Code Description L89.153 Pressure ulcer of sacral region, stage  3 L89.023 Pressure ulcer of left elbow, stage 3 G20 Parkinson's disease L03.312 Cellulitis of back [any part except buttock] S81.811D Laceration without foreign body, right lower leg, subsequent encounter Wound Cleansing o Cleanse wound with mild soap and water Primary Wound Dressing o Boardered Foam Dressing - for protection to both newly healed ulcer sites, may put AandD ointment or gentamicin cream on each site as needed Dressing Change Frequency o Change dressing every day. Follow-up Appointments o Return Appointment in 2 weeks. Home Health o Continue Home Health Visits o Home Health Nurse may visit PRN to address patientos wound care needs. o FACE TO FACE ENCOUNTER: MEDICARE and MEDICAID PATIENTS: I certify that this patient is under my care and that I had a face-to-face encounter that meets the physician face-to-face encounter requirements with this patient on this date. The encounter with the patient was in whole or in part for the following MEDICAL CONDITION: (primary reason for Home Healthcare) MEDICAL NECESSITY: I certify, that based on my findings, NURSING services are a medically necessary home health service. HOME BOUND STATUS: I certify that my clinical findings support that this patient is homebound (i.e., Due to illness or injury, pt requires aid of supportive devices such as crutches, cane, wheelchairs, walkers, the use of special transportation or the assistance of another person to leave their place of residence. There is a normal inability to leave the home and doing so requires considerable and taxing effort. Other absences are for medical reasons / religious services and are infrequent or of short duration when for  other reasons). o If current dressing causes regression in wound condition, may D/C ordered dressing product/s and apply Normal Saline Moist Dressing daily until next Wound Healing Center / Other MD appointment. Notify Wound Healing Center of regression in wound condition at [phone]. o Please direct any NON-WOUND related issues/requests for orders to patient's Primary Care Physician Electronic Signature(s) Signed: 09/07/2018 10:52:05 AM By: Curtis Sites Signed: 09/10/2018 8:01:46 PM By: Lenda Kelp PA-C Entered By: Curtis Sites on 09/07/2018 10:52:05 [name] ([id number]) PHILLIP, SANDLER ([id number]) -------------------------------------------------------------------------------- Problem List Details Patient Name: [name] Date of Service: 09/07/2018 10:00 AM Medical Record Number: [id number] Patient Account Number: [id number] Date of Birth/Sex: [birth year] (84 y.o. F) Treating RN: Curtis Sites Primary Care Provider: Vonita Moss Other Clinician: Referring Provider: Vonita Moss Treating Provider/Extender: Linwood Dibbles, HOYT Weeks in Treatment: 53 Active Problems ICD-10 Evaluated Encounter Code Description Active Date Today Diagnosis L89.153 Pressure ulcer of sacral region, stage 3 01/07/2017 No Yes L89.023 Pressure ulcer of left elbow, stage 3 03/23/2018 No Yes G20 Parkinson's disease 01/07/2017 No Yes L03.312 Cellulitis of back [any part except buttock] 07/28/2017 No Yes S81.811D Laceration without foreign body, right lower leg, subsequent 08/11/2017 No Yes encounter Inactive Problems Resolved Problems Electronic Signature(s) Signed: 09/10/2018 8:01:46 PM By: Lenda Kelp PA-C Entered By: Lenda Kelp on 09/07/2018 10:00:37 [name] ([id number]) -------------------------------------------------------------------------------- Progress Note Details Patient Name: [name] Date of Service: 09/07/2018 10:00 AM Medical  Record Number: [id number] Patient Account Number: [id number] Date of Birth/Sex: Feb 03, 1923 (84 y.o. F) Treating RN: Curtis Sites Primary Care Provider: Vonita Moss Other Clinician: Referring Provider: Vonita Moss Treating Provider/Extender: Linwood Dibbles, HOYT Weeks in Treatment: 37 Subjective Chief Complaint Information obtained from Patient multiple wounds/ulcers History of Present Illness (HPI) 01/07/17 this is a 84 year old woman admitted to the clinic today for review of a pressure ulcer on her lower sacrum. She is referred from her primary physician's office after  being seen on 3/22 with a 3 cm pressure area. Her daughter and caretaker accompanied her today state that the area first became obvious about a month ago and his since deteriorated. They have recently got [name] a home health involved and have been using [name] to the wound. They have ordered a pressure relief surface for her mattress. They are turning her religiously. They state that she eats well and they've been forcing fluids on her. She is on a multivitamin. Looking through [facility] point last albumin I see was 4.4 on 10/28. The patient has advanced parkinsonism which looks superficially like advanced Parkinson's disease although her daughter tells me she did not ever respond to Sinemet therefore this may have another pathology with signs of parkinsonism. However I think this is largely a mute point currently. She also has dementia and is nonambulatory. Since this started they have been keeping her in bed and turning her religiously every 2 hours. She lives at home in [place] with her husband with 24/7 care giving 01/13/17 santyl change qd. Still will require further debridement. continue santyl. 01/20/17; patient's wound actually looks some better less adherent necrotic surface. There is actually visible granulation. We're using [name] 01/27/17; better-looking surface but still a lot of necrotic tissue on the base  of this wound. The periwound erythema is better than last week we are still using [name]. Her daughter tells [place] that she is still having trouble with the pressure-relief mattress through medical modalities 02/03/18; I had the patient scheduled for a two week followup however her daughter brought her in early concerned for discoloration on 2 areas of the wound circumference. We have bee using santyl 02/10/17;Better looking surface to the wound. Rim appears better suggesting better offloading. Using santyl 02/24/17; change to Silver Collegen last time. Wound appears better. 03/10/17; still using silver collagen religious offloading. Intake is satisfactory per her daughter. Dimension slightly better 03/12/2017 -- Dr. Jannetta Quint patient who had been seen 2 days ago and was doing fairly well. The patient is brought in by her daughter who noticed a new wound just above the previous wound on her sacral area and going on more to the left lateral side. She was very concerned and we asked her to get in for an opinion. 03/17/17; above is noted. The patient has developed a progressive area to the left of her original wound. This seems to this started with a ring of red skin with a more pale interior almost looking fungal. There was a rim of blister through part of the area although this did not look like zoster. They have been applying triamcinolone that was prescribed last week by Dr. Meyer Russel and the area has a fold to a linear band area which is confluent, erythematous and with obvious epidermal swelling but there is no overt tenderness or crepitus. She has lost some surface epithelium closer to the wound surface itself and now has a more superficial wound in this area and the extending erythema goes towards the left buttock. This is well demarcated between involved in normal skin but once again does not appear to be at all tender. If there is a contact issue here I cannot get the history out of the daughter or the  caregiver that are with her. 03/24/17; the patient arrives today with the wound slightly worse slightly more drainage. The bandlike area of erythema that I treated as a possible pineal infection has improved somewhat although proximally is still has confluent erythema without overt tenderness. We have  been using silver alginate since the most recent deterioration. To the bandlike degree of erythema we have been using Lotrisone cream 03/31/17; patient arrives today with the wound slightly larger, necrotic surface and surrounding erythema. The bandlike area of erythema that I treated as a possible pineal infection is less swollen but still present I've been using Lotrisone cream on that largely related to the presence of a tinea looking infection when this was first seen. We've been using silver alginate. NAKYAH, ERDMANN ([id number]) X-ray that I ordered last week showed no acute bony abnormalities mild fecal impaction. Lab work showed a comprehensive metabolic panel that was normal including an albumin of 3.8. White count was 9.5 hemoglobin 12.3 differential count normal. C-reactive protein was less than 1 and sedimentation rate and 17. The latter 2 values does not support an ongoing bacterial infection. 04/14/17; patient arrives after a 2 week hiatus. Her wound is not in good condition. Although the base of the wound looks stable she still has an erythematous area that was apparently blistered over the weekend. This again points to the left. As our intake nurse pointed out today this is in the area where the tissue folds together and we may need to prevent try to prevent this. Lab work and x-ray that I did to 3 weeks ago were unremarkable including her albumin nevertheless she is an extremely frail condition physically. We have have been using [name] 04/22/17; I changed her to silver alginate because of the surrounding maceration and moisture last week. The daughter did not like the way the wound  looked in the middle of the week and changed her back to [place]. They're putting gauze on top of this. Thinks still using Lotrisone. 05/06/17 on evaluation today patient sacral wound appears to be doing okay and does not seem to be any worse. She is having no significant pain during evaluation today the secondary to mental status she is unable to rate or describe whether she had any pain she was not however flinching. Her daughter states that the wound does appear to be looking better to her. Still we are having difficulty with the skinfold that seems to be closing in on itself at this point. All in all I feel like she is making some good progress in the [name] seems to be the official for her. They do tell me that a refill if we are gonna continue that today. No fevers, chills, nausea, or vomiting noted at this time. 05/13/17 presents today for evaluation concerning her ongoing sacral pressure ulcer. Unfortunately she also has an area of deep tissue injury in the right Ischial region which is starting to show up. The sacral wound also continues to show signs of necrotic tissue overlying and has declined. Overall we really have not seen a significant improvement in the past several months in regard to the sacral wound and now patient is starting to develop a new wound in the right Ischial region. Obviously this is not trending in the direction that we want to see. No fevers, chills, nausea, or vomiting noted at this time. 05/19/17; I have not seen this wound and almost a month however there is nothing really positive to say about it. Necrotic tissue over the surface which superiorly I think abuts on her sacrum. She has surrounding erythema. I would be surprised if there is not underlying osteomyelitis or soft tissue infection. 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 She apparently eats well per description although I wonder about  this looking at her. Lab work I did probably 4 weeks ago  however was really quite normal including a serum albumin 05/26/17; culture I did last week grew Escherichia coli and methicillin sensitive staph aureus which should've been well covered by the Augmentin and ciprofloxacin. Indeed the erythema around the wound in the bed of the wound looks somewhat better. Her intake is still satisfactory. They now have a near fluidized bed 06/02/17; they completed the antibiotics last Friday. Using collagen. Daughter still reports eating and drinking well. There is less visualized erythema around the wound. 06/16/17; large pressure ulcer over her lower sacrum and coccyx. Using [name] to the wound bed. 06/30/17; certainly no change in dimensions of this large stage III wound over her sacrum and coccyx. They've been using [name]. There is no exposed bone. She has a candidal/tinea area in the right inguinal area. Other than that her daughter relates that she is eating and drinking well there changing her positioning to make sure the areas offloaded 07/14/17; no major change in the dimensions of this large stage III wound. Initially a smaller wound that became secondarily infected causing significant tissue breakdown although it is been stable in the last several weeks. Tunneling superiorly at roughly 1:00 no change here either. There is no bone palpable. Both the patient's daughter and caretaker states that she eats well. I have not rechecked her blood work 07/27/17; patient arrives in clinic today and generally a deteriorated looking state. Mild fever with axillary temperature of 100.4. Daughter reports she has not been eating and drinking well since yesterday. She looks more pale and thin and less responsive. We have been using silver collagen to her wound 08/11/17; since the last time the patient was here things have gone better. Her fever went down and she started eating and drinking again. Culture I did of the wound showed methicillin sensitive staph aureus,  Morganella and enterococcus. I only treated her with Keflex which would've not covered the Morganella and enterococcus however the purulent area on the 2:00 side of her wound is a lot better and the bandlike erythema that concern me also was resolved. I'd called the daughter last week to confirm that she was a lot better. She finished the Keflex last Thursday she also suffered a skin tear this morning perhaps while putting on her incontinence brief period is on the lateral aspect of her right leg. Clean wound with the surface epithelium not viable. 08/25/17; last week the patient was noted to have erythema around the wound margin and a slight fever which the patient's daughter says was 25. Our office was contacted by home health however we did not have a space to work the patient in that she went to see her primary physician Dr. Maurice March. She was not febrile during this visit on 08/21/17 there was erythema around the wound similar to last occasion. Dr. Maurice March in reference to my culture from 07/28/17 and put her on doxycycline capsules which they're opening twice a day for 10 days. NGINA, ROYER ([id number]) 09/17/17; patient arrives today with the wound bed looking fairly well granulated. There is undermining from 4 to 6:00 although this seems to of contracted slightly. She does not have obvious infection although the daughter states there was some darkening of the wound circumference that is not evident today. They state she is eating well. They are concerned about oral thrush 09/30/16; very fibrin looking granulated wound bed. Her undermining from 4 to 6:00 is about the same but also  appears to be well granulated. She has rolled edges of senescent tissue from roughly 7 to 12:00. There is no evidence of infection 10/13/17 on evaluation today patient appears to be doing fairly well all things considered in regard to her sacral wound. There's really not a lot of significant change or improvement she does  have some evidence of contusion and deep tissue injury around the left border of the wound that patient's daughter did inquire about today. Nonetheless overall the wound appears to be doing about the same in my opinion. There is no significant indication of infection there also is no significant slough noted at this point. 10/27/17 She is here in follow up evaluation of a sacral ulcer. She is accompanied by her daughter and caregiver. There is red granulation tissue throughout, persistent discoloration to left border; this appears consistent with deep tissue injury. There are multiple areas covered in foam borders, tegaderm, etc that are "preventative" with "no wounds". We will continue with prisma and continue with two week follow ups 11/17/17 on evaluation today patient appears to be doing decently well in regard to her wound at this point. She continues to have a sacral wound ulcer. We see her roughly every two weeks. In the last week she was actually in the hospital due to what was diagnosed as sepsis although no organisms were ever identified in the actual blood cultures. The hospital really was not sure that the wound was the cause of the infection but they really did not figure out anything else that would be a causative organism she did have a CT scan and that revealed no evidence of pneumonia there was also no evidence of urinary tract infection. Again it very well could have been the wound called in those although wound appears to be doing fairly well at this point. 12/15/17 on evaluation today patient actually appears to be doing about the same in regard to her sacral ulcer. The one thing different is that she does seem to have a rash where her right arm is contracted and being held to the thorax. The area underlying both on the ventral side of the arm as well is the thorax where it comes in contact shows evidence of a rash which appears to be fungal in nature. There does not appear to be  any evidence of infection lies at this point. There does not appear to be a rash consistent with shingles which was also of concern initially. No fevers, chills, nausea, or vomiting noted at this time. 12/29/17 on evaluation today patient appears to be doing a little better in regard to her sacral wound although the one changes the 12 o'clock location of the wound seems to have attached at one point which no longer allows this to pull back. This has caused an area of undermining that seems to be attaching as well in the 12 o'clock location. I do think that this is something that can be managed and is not necessarily a bad thing. Nonetheless she does seem to have some pain with exploration of this region of undermining. She continues to have an area under her right arm on the chest wall of erythema although this is a little better especially after the home health nurse is actually got in order for Diflucan times one for the patient. She is a new area on the left wrist that looks like because of the contraction her nail may have pushed in on her wrist area causing a slight cut which subsequently  became infected. She has some honey crusted drainage noted. 03/09/18 undervaluation today patient appears to be doing fairly well in regard to her wounds in general. She has been tolerating the dressing changes without complication. Overall I'm pleased with the progress that seems to be made week by week especially in regard to the sacral ulcer. Her daughter and the caregiver seem to take very good care of her. With that being said she has very little undermining in regard to the sacral wound in good granulation she also has good epithelialization noted. 03/23/18 on evaluation today patient appears to be doing rather well in regard to her sacral wound. Unfortunately she does have a little bit of necrotic tissue noted in the central portion of her wound on the left elbow. This is due to the fact that she is lying  on this arm seeing how it is contracted. Currently her daughter has started to avoid lying on the side it all due to the fact that the necrotic tissue was noted. With that being said they have been taking very good care of her in my pinion. Fortunately there does not appear to be any evidence of infection which is good news. Overall I'm pleased with the progress she's made other than in regard to the elbow. 04/06/18 on evaluation today patient actually appears to be doing okay in regard to the sacral wound. In fact it appears to be somewhat smaller in general although there does appear to be more undermining at the 6 o'clock location than was previously noted. She has been tolerating the dressing changes without complication in general which is also good news. Nonetheless she does have a new small skin tear/opening on her leg which was evaluated today. Fortunately this appears to be minimal and the Xeroform gauze which has been used up to this point in the past has been utilized very effectively seems YER, CASTELLO. ([id number]) to be doing well in that regard. Her left elbow ulcer seems to also be doing excellent at this point making good progress. 04/20/18 on evaluation today patient appears to be doing fairly well in regard to her sacral wound. This definitely seems to be better than during last evaluation where it was indeed infected. With that being said she has been tolerating the dressing changes as best it can be expected. Her elbow seems to be drying out and a lot of times the dressing is getting stuck according to her caregiver. 05/04/18-She is seen in follow-up evaluation for a sacral and left elbow pressure ulcer. These are stable/improved and we will continue with same treatment plan and she will follow-up in 2 weeks 05/18/18 on evaluation today patient actually appears to be doing better in regard to her left elbow ulcer. The sacral wound is also shown signs of improvement which is good  news. With that being said she does have a new right medial lower extremity ulcer which actually appears to show some signs of cellulitis/infection. She is previously taken Augmentin with good result. Nonetheless the patient really does not have anything that I can culture at the site there's actually some good epithelialization noted although again there is some cellulitis appearance as well. 06/01/18 on evaluation today patient appears to actually be doing very well in general in regard to her left elbow wound which is smaller in her lower extremity ulcer which is actually healed. With that being said the sacral wound in particular show signs of improvement although she still has undermining in the six-7 o'clock  location. No fevers, chills, nausea, or vomiting noted at this time. 06/15/18 on evaluation today patient actually appears to be doing excellent in regard to her elbow ulcer on the left elbow. This is shown signs of great improvement and I do feel like she is very close to healing in this regard. In general her sacral wound also appears to be doing well there's good sign of improvement there as well as far as the overall surface of the wound is concerned. She does have a significant area of tunneling at the 6 o'clock location that still is about the same really there's no significant improvement in that regard. Nonetheless we are attempting to try and treat this by way of packing with Prisma followed by silver so to help control moisture. The patient been tolerating the dressing changes without complication. 06/29/18 on evaluation today patient actually appears to be doing very well in regard to her left elbow ulcer and her sacral ulcer. She has been tolerating the dressing changes without complication which is good news. In fact the sacral wound appears to have almost completely closed although she does have an area of undermining/tunneling at roughly the 7 o'clock location that is still  unfortunately having a more difficult time closing although as opposed to during the last visit I was unable to get a normal Q-tip down into the undermined region. Actually had to use a skinny probe or else the backside of the Q-tip were just a wooden stake was. I think this is a good sign that this area seems to be filling in which is wonderful. Overall I'm pleased with how things are progressing. 07/13/18 on evaluation today patient appears to be doing a little worse in regard to the overall appearance of the sacral wound. She has been tolerating the dressing changes without complication. With that being said the patient's daughter tells me that the home health nurse actually dug around it significantly in the 12 o'clock location of the wound and subsequently reopen this area which was very well healed previous. This was a new nurse that hasn't generally been coming out to see the patient. Nonetheless they are very upset about this and the fact that the wound was actually worse when she was done "taking". It does appear that the wound is recovering and in fact since that point has closed quite significantly compared to what they tell me it looked like when she was done. 07/27/18 evaluation today patient appears to be doing well in regard to her elbow ulcer as well as the sacral ulcer. She has actually been doing excellent at this point in time. Fortunately there does not appear to be any evidence of infection currently. Overall I'm very happy with the progress at this time. She does have a couple other areas where there was some redness noted a fortunately there does not appear to be the evidence of more significant skin breakdown although there just slightly blanchable regions that I think do need to be watched carefully. 08/10/18 upon evaluation today patient actually appears to be showing signs of improvement at all sites evaluated today. This is even true in the sacral region where she has  less expensive undermining although there is still undermining noted unfortunately. They're having a very hard time being able to pack anything into this area. She may benefit from utilizing gentamicin cream and just a cover dressing. 08/24/18 on evaluation today patient sacral ulcer actually appears to be almost completely healed. She has done extremely well in  this regard and I think this is in most part due to her family who has been excellent in taking care of her as well as her caregiver. Overall I'm extremely pleased in this regard. Her left shoulder area did open up since the last visit where I saw her although this was something that we potentially expected. Fortunately it does not appear to show any signs of significant infection overall I think that an alginate dressing would be very well at the site. ANNABETH, TORTORA ([id number]) 09/07/18 on evaluation today the patient actually appears to be completely healed in regard to all of her wounds. There is currently nothing remaining open at this point. She has been tolerating the dressing changes without complication. Overall I'm very pleased with how things have progressed. Patient's daughter is also extremely happy. Patient History Unable to Obtain Patient History due to Altered Mental Status. Information obtained from Patient. Social History Never smoker, Marital Status - Married, Alcohol Use - Never, Drug Use - No History, Caffeine Use - Never. Medical And Surgical History Notes Ear/Nose/Mouth/Throat dysphagia Neurologic parkinsons, tremor Review of Systems (ROS) Constitutional Symptoms (General Health) Denies complaints or symptoms of Fever, Chills. Respiratory The patient has no complaints or symptoms. Cardiovascular The patient has no complaints or symptoms. Psychiatric The patient has no complaints or symptoms. Objective Constitutional Well-nourished and well-hydrated in no acute distress. Vitals Time Taken: 9:55 AM,  Height: 60 in, Weight: 90 lbs, BMI: 17.6, Temperature: 97.8 F, Pulse: 72 bpm, Respiratory Rate: 16 breaths/min, Blood Pressure: 106/62 mmHg. Respiratory normal breathing without difficulty. clear to auscultation bilaterally. Cardiovascular regular rate and rhythm with normal S1, S2. Psychiatric this patient is able to make decisions and demonstrates good insight into disease process. Alert and Oriented x 3. pleasant and cooperative. General Notes: Patient's wound bed currently shows complete epithelialization in all locations there's little bit of dry skin but no evidence of imminent breakdown at any site especially the sacral region. I do think this area needs to be protected to LATASH, NOURI. ([id number]) ensure that nothing reopens but other than that overall I'm very pleased with the progress. Integumentary (Hair, Skin) Wound #1 status is Healed - Epithelialized. Original cause of wound was Pressure Injury. The wound is located on the Midline Coccyx. The wound measures 0cm length x 0cm width x 0cm depth; 0cm^2 area and 0cm^3 volume. There is no tunneling or undermining noted. There is a none present amount of drainage noted. The wound margin is epibole. There is large (67- 100%) red granulation within the wound bed. There is no necrotic tissue within the wound bed. The periwound skin appearance exhibited: Rash, Scarring, Erythema. The periwound skin appearance did not exhibit: Callus, Crepitus, Excoriation, Induration, Dry/Scaly, Maceration, Atrophie Blanche, Cyanosis, Ecchymosis, Hemosiderin Staining, Mottled, Pallor, Rubor. The surrounding wound skin color is noted with erythema. Periwound temperature was noted as No Abnormality. Wound #13 status is Healed - Epithelialized. Original cause of wound was Pressure Injury. The wound is located on the Left Shoulder. The wound measures 0cm length x 0cm width x 0cm depth; 0cm^2 area and 0cm^3 volume. There is no tunneling or undermining  noted. There is a none present amount of drainage noted. The wound margin is flat and intact. There is no granulation within the wound bed. There is no necrotic tissue within the wound bed. The periwound skin appearance exhibited: Dry/Scaly, Erythema. The periwound skin appearance did not exhibit: Callus, Crepitus, Excoriation, Induration, Rash, Scarring, Maceration, Atrophie Blanche, Cyanosis, Ecchymosis, Hemosiderin Staining, Mottled, Pallor,  Rubor. The surrounding wound skin color is noted with erythema. Periwound temperature was noted as No Abnormality. The periwound has tenderness on palpation. Assessment Active Problems ICD-10 Pressure ulcer of sacral region, stage 3 Pressure ulcer of left elbow, stage 3 Parkinson's disease Cellulitis of back [any part except buttock] Laceration without foreign body, right lower leg, subsequent encounter Plan Wound Cleansing: Cleanse wound with mild soap and water Primary Wound Dressing: Boardered Foam Dressing - for protection to both newly healed ulcer sites, may put AandD ointment or gentamicin cream on each site as needed Dressing Change Frequency: Change dressing every day. Follow-up Appointments: Return Appointment in 2 weeks. Home Health: Continue Home Health Visits Home Health Nurse may visit PRN to address patient s wound care needs. FACE TO FACE ENCOUNTER: MEDICARE and MEDICAID PATIENTS: I certify that this patient is under my care and that I had a face-to-face encounter that meets the physician face-to-face encounter requirements with this patient on this date. The encounter with the patient was in whole or in part for the following MEDICAL CONDITION: (primary reason for Home Healthcare) MEDICAL NECESSITY: I certify, that based on my findings, NURSING services are a medically necessary home Tina MainlandSHAW, Terese W. ([id number]) health service. HOME BOUND STATUS: I certify that my clinical findings support that this patient is homebound (i.e.,  Due to illness or injury, pt requires aid of supportive devices such as crutches, cane, wheelchairs, walkers, the use of special transportation or the assistance of another person to leave their place of residence. There is a normal inability to leave the home and doing so requires considerable and taxing effort. Other absences are for medical reasons / religious services and are infrequent or of short duration when for other reasons). If current dressing causes regression in wound condition, may D/C ordered dressing product/s and apply Normal Saline Moist Dressing daily until next Wound Healing Center / Other MD appointment. Notify Wound Healing Center of regression in wound condition at [phone]. Please direct any NON-WOUND related issues/requests for orders to patient's Primary Care Physician I'm gonna recommend at this point that we see her back for 12 week check just to ensure that everything remains close. With that being said I do not see anything open at this point everything is healed I hope that it stays that way. Otherwise if anything changes patient's family will let us know. Electronic Signature(s) Signed: 09/10/2018 8:01:46 PM By: Lenda KelpStone III, Hoyt PA-C Entered By: Lenda KelpStone III, Hoyt on 09/07/2018 11:12:49 Tina MainlandSHAW, Marja W. ([id number]) -------------------------------------------------------------------------------- ROS/PFSH Details Patient Name: Tina MainlandSHAW, Lowen W. Date of Service: 09/07/2018 10:00 AM Medical Record Number: [id number] Patient Account Number: [id number] Date of Birth/Sex: 09-10-1923 (84 y.o. F) Treating RN: Curtis Sitesorthy, Joanna Primary Care Provider: Vonita MossRISSMAN, MARK Other Clinician: Referring Provider: Vonita MossRISSMAN, MARK Treating Provider/Extender: Linwood DibblesSTONE III, HOYT Weeks in Treatment: 6886 Unable to Obtain Patient History due to oo Altered Mental Status Information Obtained From Patient Wound History Do you currently have one or more open woundso Yes How many open wounds  do you currently haveo 1 Approximately how long have you had your woundso 1 week How have you been treating your wound(s) until nowo santyl and gauze Has your wound(s) ever healed and then re-openedo No Have you had any lab work done in the past montho No Have you tested positive for an antibiotic resistant organism (MRSA, VRE)o No Have you tested positive for osteomyelitis (bone infection)o No Have you had any tests for circulation on your legso No Constitutional Symptoms (General Health) Complaints and Symptoms:  Negative for: Fever; Chills Eyes Medical History: Negative for: Cataracts; Glaucoma; Optic Neuritis Ear/Nose/Mouth/Throat Medical History: Negative for: Chronic sinus problems/congestion; Middle ear problems Past Medical History Notes: dysphagia Hematologic/Lymphatic Medical History: Positive for: Anemia Negative for: Hemophilia; Human Immunodeficiency Virus; Lymphedema; Sickle Cell Disease Respiratory Complaints and Symptoms: No Complaints or Symptoms Medical History: Negative for: Aspiration; Asthma; Chronic Obstructive Pulmonary Disease (COPD); Pneumothorax; Sleep Apnea; Tuberculosis LEIGHTYN, CINA ([id number]) Cardiovascular Complaints and Symptoms: No Complaints or Symptoms Medical History: Positive for: Hypertension Negative for: Angina; Arrhythmia; Congestive Heart Failure; Coronary Artery Disease; Deep Vein Thrombosis; Hypotension; Myocardial Infarction; Peripheral Arterial Disease; Peripheral Venous Disease; Phlebitis; Vasculitis Gastrointestinal Medical History: Negative for: Cirrhosis ; Colitis; Crohnos; Hepatitis A; Hepatitis B; Hepatitis C Endocrine Medical History: Negative for: Type I Diabetes; Type II Diabetes Genitourinary Medical History: Negative for: End Stage Renal Disease Immunological Medical History: Negative for: Lupus Erythematosus; Raynaudos; Scleroderma Integumentary (Skin) Medical History: Negative for: History of Burn;  History of pressure wounds Musculoskeletal Medical History: Negative for: Gout; Rheumatoid Arthritis; Osteoarthritis; Osteomyelitis Neurologic Medical History: Positive for: Dementia Negative for: Neuropathy; Quadriplegia; Paraplegia; Seizure Disorder Past Medical History Notes: parkinsons, tremor Oncologic Medical History: Negative for: Received Chemotherapy; Received Radiation Psychiatric Complaints and Symptoms: No Complaints or Symptoms Medical History: Negative for: Anorexia/bulimia; Confinement Anxiety MAKAYLEN, THIEME ([id number]) Immunizations Pneumococcal Vaccine: Received Pneumococcal Vaccination: Yes Immunization Notes: up to date Implantable Devices Family and Social History Never smoker; Marital Status - Married; Alcohol Use: Never; Drug Use: No History; Caffeine Use: Never; Financial Concerns: No; Food, Clothing or Shelter Needs: No; Support System Lacking: No; Transportation Concerns: No; Advanced Directives: Yes (Not Provided); Patient does not want information on Advanced Directives; Medical Power of Attorney: Yes - dtr (Not Provided) Physician Affirmation I have reviewed and agree with the above information. Electronic Signature(s) Signed: 09/07/2018 5:28:58 PM By: Curtis Sites Signed: 09/10/2018 8:01:46 PM By: Lenda Kelp PA-C Entered By: Lenda Kelp on 09/07/2018 11:11:49 [name] ([id number]) -------------------------------------------------------------------------------- SuperBill Details Patient Name: [name] Date of Service: 09/07/2018 Medical Record Number: [id number] Patient Account Number: [id number] Date of Birth/Sex: [birth year] (84 y.o. F) Treating RN: Curtis Sites Primary Care Provider: Vonita Moss Other Clinician: Referring Provider: Vonita Moss Treating Provider/Extender: Linwood Dibbles, HOYT Weeks in Treatment: 86 Diagnosis Coding ICD-10 Codes Code Description L89.153 Pressure ulcer of sacral region,  stage 3 L89.023 Pressure ulcer of left elbow, stage 3 G20 Parkinson's disease L03.312 Cellulitis of back [any part except buttock] S81.811D Laceration without foreign body, right lower leg, subsequent encounter Facility Procedures CPT4 Code: [id number] Description: 99213 - WOUND CARE VISIT-LEV 3 EST PT Modifier: Quantity: 1 Physician Procedures CPT4 Code: 3244010 Description: 99213 - WC PHYS LEVEL 3 - EST PT ICD-10 Diagnosis Description L89.153 Pressure ulcer of sacral region, stage 3 L89.023 Pressure ulcer of left elbow, stage 3 G20 Parkinson's disease L03.312 Cellulitis of back [any part except buttock] Modifier: Quantity: 1 Electronic Signature(s) Signed: 09/10/2018 8:01:46 PM By: Lenda Kelp PA-C Entered By: Lenda Kelp on 09/07/2018 11:13:04

## 2018-09-20 ENCOUNTER — Encounter: Payer: Medicare Other | Admitting: Physician Assistant

## 2018-09-20 DIAGNOSIS — L89153 Pressure ulcer of sacral region, stage 3: Secondary | ICD-10-CM | POA: Diagnosis not present

## 2018-09-21 ENCOUNTER — Ambulatory Visit: Payer: Medicare Other | Admitting: Physician Assistant

## 2018-09-24 NOTE — Progress Notes (Addendum)
[name], [name] ([id number]) Visit Report for 09/20/2018 Chief Complaint Document Details Patient Name: Tina, [name] Date of Service: 09/20/2018 10:00 AM Medical Record Number: [id number] Patient Account Number: [id number] Date of Birth/Sex: 1923/06/12 (84 y.o. F) Treating RN: Arnette Norris Primary Care Provider: Vonita Moss Other Clinician: Referring Provider: Vonita Moss Treating Provider/Extender: Linwood Dibbles, HOYT Weeks in Treatment: 81 Information Obtained from: Patient Chief Complaint multiple wounds/ulcers Electronic Signature(s) Signed: 09/23/2018 10:16:39 PM By: Lenda Kelp PA-C Entered By: Lenda Kelp on 09/20/2018 10:08:18 [name] ([id number]) -------------------------------------------------------------------------------- HPI Details Patient Name: [name] Date of Service: 09/20/2018 10:00 AM Medical Record Number: [id number] Patient Account Number: [id number] Date of Birth/Sex: 10-12-1922 (84 y.o. F) Treating RN: Arnette Norris Primary Care Provider: Vonita Moss Other Clinician: Referring Provider: Vonita Moss Treating Provider/Extender: Linwood Dibbles, HOYT Weeks in Treatment: 42 History of Present Illness HPI Description: 01/07/17 this is a 84 year old woman admitted to the clinic today for review of a pressure ulcer on her lower sacrum. She is referred from her primary physician's office after being seen on 3/22 with a 3 cm pressure area. Her daughter and caretaker accompanied her today state that the area first became obvious about a month ago and his since deteriorated. They have recently got [name] home health involved and have been using [name] to the wound. They have ordered a pressure relief surface for her mattress. They are turning her religiously. They state that she eats well and they've been forcing fluids on her. She is on a multivitamin. Looking through [facility] point last albumin I see was 4.4 on 10/28. The  patient has advanced parkinsonism which looks superficially like advanced Parkinson's disease although her daughter tells me she did not ever respond to Sinemet therefore this may have another pathology with signs of parkinsonism. However I think this is largely a mute point currently. She also has dementia and is nonambulatory. Since this started they have been keeping her in bed and turning her religiously every 2 hours. She lives at home in [place] with her husband with 24/7 care giving 01/13/17 santyl change qd. Still will require further debridement. continue santyl. 01/20/17; patient's wound actually looks some better less adherent necrotic surface. There is actually visible granulation. We're using [name] 01/27/17; better-looking surface but still a lot of necrotic tissue on the base of this wound. The periwound erythema is better than last week we are still using [name]. Her daughter tells [place] that she is still having trouble with the pressure-relief mattress through medical modalities 02/03/18; I had the patient scheduled for a two week followup however her daughter brought her in early concerned for discoloration on 2 areas of the wound circumference. We have bee using santyl 02/10/17;Better looking surface to the wound. Rim appears better suggesting better offloading. Using santyl 02/24/17; change to Silver Collegen last time. Wound appears better. 03/10/17; still using silver collagen religious offloading. Intake is satisfactory per her daughter. Dimension slightly better 03/12/2017 -- Dr. Jannetta Quint patient who had been seen 2 days ago and was doing fairly well. The patient is brought in by her daughter who noticed a new wound just above the previous wound on her sacral area and going on more to the left lateral side. She was very concerned and we asked her to get in for an opinion. 03/17/17; above is noted. The patient has developed a progressive area to the left of her original wound. This  seems to this started with a ring of red skin  with a more pale interior almost looking fungal. There was a rim of blister through part of the area although this did not look like zoster. They have been applying triamcinolone that was prescribed last week by [name] and the area has a fold to a linear band area which is confluent, erythematous and with obvious epidermal swelling but there is no overt tenderness or crepitus. She has lost some surface epithelium closer to the wound surface itself and now has a more superficial wound in this area and the extending erythema goes towards the left buttock. This is well demarcated between involved in normal skin but once again does not appear to be at all tender. If there is a contact issue here I cannot get the history out of the daughter or the caregiver that are with her. 03/24/17; the patient arrives today with the wound slightly worse slightly more drainage. The bandlike area of erythema that I treated as a possible pineal infection has improved somewhat although proximally is still has confluent erythema without overt tenderness. We have been using silver alginate since the most recent deterioration. To the bandlike degree of erythema we have been using Lotrisone cream 03/31/17; patient arrives today with the wound slightly larger, necrotic surface and surrounding erythema. The bandlike area of erythema that I treated as a possible pineal infection is less swollen but still present I've been using Lotrisone cream on that largely related to the presence of a tinea looking infection when this was first seen. We've been using silver alginate. X-ray that I ordered last week showed no acute bony abnormalities mild fecal impaction. Lab work showed a comprehensive metabolic panel that was normal including an albumin of 3.8. White count was 9.5 hemoglobin 12.3 differential count normal. C-reactive protein was less than 1 and sedimentation rate and 17. The  latter 2 values does not support an ongoing bacterial infection. 04/14/17; patient arrives after a 2 week hiatus. Her wound is not in good condition. Although the base of the wound looks [name], [name] ([id number]) stable she still has an erythematous area that was apparently blistered over the weekend. This again points to the left. As our intake nurse pointed out today this is in the area where the tissue folds together and we may need to prevent try to prevent this. Lab work and x-ray that I did to 3 weeks ago were unremarkable including her albumin nevertheless she is an extremely frail condition physically. We have have been using [name] 04/22/17; I changed her to silver alginate because of the surrounding maceration and moisture last week. The daughter did not like the way the wound looked in the middle of the week and changed her back to MelletteSantyl. They're putting gauze on top of this. Thinks still using Lotrisone. 05/06/17 on evaluation today patient sacral wound appears to be doing okay and does not seem to be any worse. She is having no significant pain during evaluation today the secondary to mental status she is unable to rate or describe whether she had any pain she was not however flinching. Her daughter states that the wound does appear to be looking better to her. Still we are having difficulty with the skinfold that seems to be closing in on itself at this point. All in all I feel like she is making some good progress in the [name] seems to be the official for her. They do tell me that a refill if we are gonna continue that today. No fevers, chills, nausea,  or vomiting noted at this time. 05/13/17 presents today for evaluation concerning her ongoing sacral pressure ulcer. Unfortunately she also has an area of deep tissue injury in the right Ischial region which is starting to show up. The sacral wound also continues to show signs of necrotic tissue overlying and has declined. Overall we  really have not seen a significant improvement in the past several months in regard to the sacral wound and now patient is starting to develop a new wound in the right Ischial region. Obviously this is not trending in the direction that we want to see. No fevers, chills, nausea, or vomiting noted at this time. 05/19/17; I have not seen this wound and almost a month however there is nothing really positive to say about it. Necrotic tissue over the surface which superiorly I think abuts on her sacrum. She has surrounding erythema. I would be surprised if there is not underlying osteomyelitis or soft tissue infection. 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 She apparently eats well per description although I wonder about this looking at her. Lab work I did probably 4 weeks ago however was really quite normal including a serum albumin 05/26/17; culture I did last week grew Escherichia coli and methicillin sensitive staph aureus which should've been well covered by the Augmentin and ciprofloxacin. Indeed the erythema around the wound in the bed of the wound looks somewhat better. Her intake is still satisfactory. They now have a near fluidized bed 06/02/17; they completed the antibiotics last Friday. Using collagen. Daughter still reports eating and drinking well. There is less visualized erythema around the wound. 06/16/17; large pressure ulcer over her lower sacrum and coccyx. Using [name] to the wound bed. 06/30/17; certainly no change in dimensions of this large stage III wound over her sacrum and coccyx. They've been using [name]. There is no exposed bone. She has a candidal/tinea area in the right inguinal area. Other than that her daughter relates that she is eating and drinking well there changing her positioning to make sure the areas offloaded 07/14/17; no major change in the dimensions of this large stage III wound. Initially a smaller wound that became secondarily infected causing  significant tissue breakdown although it is been stable in the last several weeks. Tunneling superiorly at roughly 1:00 no change here either. There is no bone palpable. Both the patient's daughter and caretaker states that she eats well. I have not rechecked her blood work 07/27/17; patient arrives in clinic today and generally a deteriorated looking state. Mild fever with axillary temperature of 100.4. Daughter reports she has not been eating and drinking well since yesterday. She looks more pale and thin and less responsive. We have been using silver collagen to her wound 08/11/17; since the last time the patient was here things have gone better. Her fever went down and she started eating and drinking again. Culture I did of the wound showed methicillin sensitive staph aureus, Morganella and enterococcus. I only treated her with Keflex which would've not covered the Morganella and enterococcus however the purulent area on the 2:00 side of her wound is a lot better and the bandlike erythema that concern me also was resolved. I'd called the daughter last week to confirm that she was a lot better. She finished the Keflex last Thursday she also suffered a skin tear this morning perhaps while putting on her incontinence brief period is on the lateral aspect of her right leg. Clean wound with the surface epithelium not  viable. 08/25/17; last week the patient was noted to have erythema around the wound margin and a slight fever which the patient's daughter says was [id number]. Our office was contacted by home health however we did not have a space to work the patient in that she went to see her primary physician Dr. Maurice MarchLane. She was not febrile during this visit on 08/21/17 there was erythema around the wound similar to last occasion. Dr. Maurice MarchLane in reference to my culture from 07/28/17 and put her on doxycycline capsules which they're opening twice a day for 10 days. 09/17/17; patient arrives today with the wound  bed looking fairly well granulated. There is undermining from 4 to 6:00 although this seems to of contracted slightly. She does not have obvious infection although the daughter states there was some darkening of the wound circumference that is not evident today. They state she is eating well. They are concerned about oral thrush 09/30/16; very fibrin looking granulated wound bed. Her undermining from 4 to 6:00 is about the same but also appears to be well granulated. She has rolled edges of senescent tissue from roughly 7 to 12:00. There is no evidence of infection [name], [name] ([id number]) 10/13/17 on evaluation today patient appears to be doing fairly well all things considered in regard to her sacral wound. There's really not a lot of significant change or improvement she does have some evidence of contusion and deep tissue injury around the left border of the wound that patient's daughter did inquire about today. Nonetheless overall the wound appears to be doing about the same in my opinion. There is no significant indication of infection there also is no significant slough noted at this point. 10/27/17 She is here in follow up evaluation of a sacral ulcer. She is accompanied by her daughter and caregiver. There is red granulation tissue throughout, persistent discoloration to left border; this appears consistent with deep tissue injury. There are multiple areas covered in foam borders, tegaderm, etc that are "preventative" with "no wounds". We will continue with prisma and continue with two week follow ups 11/17/17 on evaluation today patient appears to be doing decently well in regard to her wound at this point. She continues to have a sacral wound ulcer. We see her roughly every two weeks. In the last week she was actually in the hospital due to what was diagnosed as sepsis although no organisms were ever identified in the actual blood cultures. The hospital really was not sure that the  wound was the cause of the infection but they really did not figure out anything else that would be a causative organism she did have a CT scan and that revealed no evidence of pneumonia there was also no evidence of urinary tract infection. Again it very well could have been the wound called in those although wound appears to be doing fairly well at this point. 12/15/17 on evaluation today patient actually appears to be doing about the same in regard to her sacral ulcer. The one thing different is that she does seem to have a rash where her right arm is contracted and being held to the thorax. The area underlying both on the ventral side of the arm as well is the thorax where it comes in contact shows evidence of a rash which appears to be fungal in nature. There does not appear to be any evidence of infection lies at this point. There does not appear to be a rash consistent with shingles which was  also of concern initially. No fevers, chills, nausea, or vomiting noted at this time. 12/29/17 on evaluation today patient appears to be doing a little better in regard to her sacral wound although the one changes the 12 o'clock location of the wound seems to have attached at one point which no longer allows this to pull back. This has caused an area of undermining that seems to be attaching as well in the 12 o'clock location. I do think that this is something that can be managed and is not necessarily a bad thing. Nonetheless she does seem to have some pain with exploration of this region of undermining. She continues to have an area under her right arm on the chest wall of erythema although this is a little better especially after the home health nurse is actually got in order for Diflucan times one for the patient. She is a new area on the left wrist that looks like because of the contraction her nail may have pushed in on her wrist area causing a slight cut which subsequently became infected. She has  some honey crusted drainage noted. 03/09/18 undervaluation today patient appears to be doing fairly well in regard to her wounds in general. She has been tolerating the dressing changes without complication. Overall I'm pleased with the progress that seems to be made week by week especially in regard to the sacral ulcer. Her daughter and the caregiver seem to take very good care of her. With that being said she has very little undermining in regard to the sacral wound in good granulation she also has good epithelialization noted. 03/23/18 on evaluation today patient appears to be doing rather well in regard to her sacral wound. Unfortunately she does have a little bit of necrotic tissue noted in the central portion of her wound on the left elbow. This is due to the fact that she is lying on this arm seeing how it is contracted. Currently her daughter has started to avoid lying on the side it all due to the fact that the necrotic tissue was noted. With that being said they have been taking very good care of her in my pinion. Fortunately there does not appear to be any evidence of infection which is good news. Overall I'm pleased with the progress she's made other than in regard to the elbow. 04/06/18 on evaluation today patient actually appears to be doing okay in regard to the sacral wound. In fact it appears to be somewhat smaller in general although there does appear to be more undermining at the 6 o'clock location than was previously noted. She has been tolerating the dressing changes without complication in general which is also good news. Nonetheless she does have a new small skin tear/opening on her leg which was evaluated today. Fortunately this appears to be minimal and the Xeroform gauze which has been used up to this point in the past has been utilized very effectively seems to be doing well in that regard. Her left elbow ulcer seems to also be doing excellent at this point making good  progress. 04/20/18 on evaluation today patient appears to be doing fairly well in regard to her sacral wound. This definitely seems to be better than during last evaluation where it was indeed infected. With that being said she has been tolerating the dressing changes as best it can be expected. Her elbow seems to be drying out and a lot of times the dressing is getting stuck according to her caregiver. [name],  [name] ([id number]) 05/04/18-She is seen in follow-up evaluation for a sacral and left elbow pressure ulcer. These are stable/improved and we will continue with same treatment plan and she will follow-up in 2 weeks 05/18/18 on evaluation today patient actually appears to be doing better in regard to her left elbow ulcer. The sacral wound is also shown signs of improvement which is good news. With that being said she does have a new right medial lower extremity ulcer which actually appears to show some signs of cellulitis/infection. She is previously taken Augmentin with good result. Nonetheless the patient really does not have anything that I can culture at the site there's actually some good epithelialization noted although again there is some cellulitis appearance as well. 06/01/18 on evaluation today patient appears to actually be doing very well in general in regard to her left elbow wound which is smaller in her lower extremity ulcer which is actually healed. With that being said the sacral wound in particular show signs of improvement although she still has undermining in the six-7 o'clock location. No fevers, chills, nausea, or vomiting noted at this time. 06/15/18 on evaluation today patient actually appears to be doing excellent in regard to her elbow ulcer on the left elbow. This is shown signs of great improvement and I do feel like she is very close to healing in this regard. In general her sacral wound also appears to be doing well there's good sign of improvement there as well as  far as the overall surface of the wound is concerned. She does have a significant area of tunneling at the 6 o'clock location that still is about the same really there's no significant improvement in that regard. Nonetheless we are attempting to try and treat this by way of packing with Prisma followed by silver so to help control moisture. The patient been tolerating the dressing changes without complication. 06/29/18 on evaluation today patient actually appears to be doing very well in regard to her left elbow ulcer and her sacral ulcer. She has been tolerating the dressing changes without complication which is good news. In fact the sacral wound appears to have almost completely closed although she does have an area of undermining/tunneling at roughly the 7 o'clock location that is still unfortunately having a more difficult time closing although as opposed to during the last visit I was unable to get a normal Q-tip down into the undermined region. Actually had to use a skinny probe or else the backside of the Q-tip were just a wooden stake was. I think this is a good sign that this area seems to be filling in which is wonderful. Overall I'm pleased with how things are progressing. 07/13/18 on evaluation today patient appears to be doing a little worse in regard to the overall appearance of the sacral wound. She has been tolerating the dressing changes without complication. With that being said the patient's daughter tells me that the home health nurse actually dug around it significantly in the 12 o'clock location of the wound and subsequently reopen this area which was very well healed previous. This was a new nurse that hasn't generally been coming out to see the patient. Nonetheless they are very upset about this and the fact that the wound was actually worse when she was done "taking". It does appear that the wound is recovering and in fact since that point has closed quite significantly  compared to what they tell me it looked like when she was done.  07/27/18 evaluation today patient appears to be doing well in regard to her elbow ulcer as well as the sacral ulcer. She has actually been doing excellent at this point in time. Fortunately there does not appear to be any evidence of infection currently. Overall I'm very happy with the progress at this time. She does have a couple other areas where there was some redness noted a fortunately there does not appear to be the evidence of more significant skin breakdown although there just slightly blanchable regions that I think do need to be watched carefully. 08/10/18 upon evaluation today patient actually appears to be showing signs of improvement at all sites evaluated today. This is even true in the sacral region where she has less expensive undermining although there is still undermining noted unfortunately. They're having a very hard time being able to pack anything into this area. She may benefit from utilizing gentamicin cream and just a cover dressing. 08/24/18 on evaluation today patient sacral ulcer actually appears to be almost completely healed. She has done extremely well in this regard and I think this is in most part due to her family who has been excellent in taking care of her as well as her caregiver. Overall I'm extremely pleased in this regard. Her left shoulder area did open up since the last visit where I saw her although this was something that we potentially expected. Fortunately it does not appear to show any signs of significant infection overall I think that an alginate dressing would be very well at the site. 09/07/18 on evaluation today the patient actually appears to be completely healed in regard to all of her wounds. There is currently nothing remaining open at this point. She has been tolerating the dressing changes without complication. Overall I'm very pleased with how things have progressed. Patient's  daughter is also extremely happy. 09/20/18 on evaluation today patient actually appears to be completely healed on evaluation today. This was true at the last LYDIA, TOREN. ([id number]) visit as well but we wanted to continue to monitor in order to ensure that there was nothing that reopened or worsens in the interim. Everything appears to be doing great. Electronic Signature(s) Signed: 10/17/2018 1:38:17 AM By: Lenda Kelp PA-C Previous Signature: 09/23/2018 10:16:39 PM Version By: Lenda Kelp PA-C Entered By: Lenda Kelp on 10/15/2018 08:29:06 [name] ([id number]) -------------------------------------------------------------------------------- Physical Exam Details Patient Name: [name] Date of Service: 09/20/2018 10:00 AM Medical Record Number: [id number] Patient Account Number: [id number] Date of Birth/Sex: 10/25/1922 (84 y.o. F) Treating RN: Arnette Norris Primary Care Provider: Vonita Moss Other Clinician: Referring Provider: Vonita Moss Treating Provider/Extender: STONE III, HOYT Weeks in Treatment: 52 Constitutional Well-nourished and well-hydrated in no acute distress. Respiratory normal breathing without difficulty. Psychiatric Patient is not able to cooperate in decision making regarding care. Patient has dementia. patient is confused. Notes Patient's wound bed at this point in time actually appears to show signs of excellent epithelialization at all locations. She is doing very well in my pinion. Overall I'm pleased with the progress. Electronic Signature(s) Signed: 09/23/2018 10:16:39 PM By: Lenda Kelp PA-C Entered By: Lenda Kelp on 09/21/2018 10:15:07 [name] ([id number]) -------------------------------------------------------------------------------- Physician Orders Details Patient Name: [name] Date of Service: 09/20/2018 10:00 AM Medical Record Number: [id number] Patient Account Number:  [id number] Date of Birth/Sex: September 30, 1922 (84 y.o. F) Treating RN: Arnette Norris Primary Care Provider: Vonita Moss Other Clinician: Referring Provider: Vonita Moss Treating Provider/Extender: Linwood Dibbles,  HOYT Weeks in Treatment: 53 Verbal / Phone Orders: No Diagnosis Coding ICD-10 Coding Code Description L89.153 Pressure ulcer of sacral region, stage 3 L89.023 Pressure ulcer of left elbow, stage 3 G20 Parkinson's disease L03.312 Cellulitis of back [any part except buttock] S81.811D Laceration without foreign body, right lower leg, subsequent encounter Discharge From [facility] o Discharge from [facility] Electronic Signature(s) Signed: 09/21/2018 1:37:43 PM By: Arnette Norris Signed: 09/23/2018 10:16:39 PM By: Lenda Kelp PA-C Entered By: Arnette Norris on 09/20/2018 10:38:53 [name] ([id number]) -------------------------------------------------------------------------------- Problem List Details Patient Name: [name] Date of Service: 09/20/2018 10:00 AM Medical Record Number: [id number] Patient Account Number: [id number] Date of Birth/Sex: 04-27-1923 (84 y.o. F) Treating RN: Arnette Norris Primary Care Provider: Vonita Moss Other Clinician: Referring Provider: Vonita Moss Treating Provider/Extender: Linwood Dibbles, HOYT Weeks in Treatment: 31 Active Problems ICD-10 Evaluated Encounter Code Description Active Date Today Diagnosis L89.153 Pressure ulcer of sacral region, stage 3 01/07/2017 No Yes L89.023 Pressure ulcer of left elbow, stage 3 03/23/2018 No Yes G20 Parkinson's disease 01/07/2017 No Yes L03.312 Cellulitis of back [any part except buttock] 07/28/2017 No Yes S81.811D Laceration without foreign body, right lower leg, subsequent 08/11/2017 No Yes encounter Inactive Problems Resolved Problems Electronic Signature(s) Signed: 09/23/2018 10:16:39 PM By: Lenda Kelp PA-C Entered By: Lenda Kelp on 09/20/2018  10:08:05 [name] ([id number]) -------------------------------------------------------------------------------- Progress Note Details Patient Name: [name] Date of Service: 09/20/2018 10:00 AM Medical Record Number: [id number] Patient Account Number: [id number] Date of Birth/Sex: Jul 30, 1923 (84 y.o. F) Treating RN: Arnette Norris Primary Care Provider: Vonita Moss Other Clinician: Referring Provider: Vonita Moss Treating Provider/Extender: Linwood Dibbles, HOYT Weeks in Treatment: 56 Subjective Chief Complaint Information obtained from Patient multiple wounds/ulcers History of Present Illness (HPI) 01/07/17 this is a 84 year old woman admitted to the clinic today for review of a pressure ulcer on her lower sacrum. She is referred from her primary physician's office after being seen on 3/22 with a 3 cm pressure area. Her daughter and caretaker accompanied her today state that the area first became obvious about a month ago and his since deteriorated. They have recently got [name] home health involved and have been using [name] to the wound. They have ordered a pressure relief surface for her mattress. They are turning her religiously. They state that she eats well and they've been forcing fluids on her. She is on a multivitamin. Looking through [facility] point last albumin I see was 4.4 on 10/28. The patient has advanced parkinsonism which looks superficially like advanced Parkinson's disease although her daughter tells me she did not ever respond to Sinemet therefore this may have another pathology with signs of parkinsonism. However I think this is largely a mute point currently. She also has dementia and is nonambulatory. Since this started they have been keeping her in bed and turning her religiously every 2 hours. She lives at home in [place] with her husband with 24/7 care giving 01/13/17 santyl change qd. Still will require further debridement. continue  santyl. 01/20/17; patient's wound actually looks some better less adherent necrotic surface. There is actually visible granulation. We're using [name] 01/27/17; better-looking surface but still a lot of necrotic tissue on the base of this wound. The periwound erythema is better than last week we are still using [name]. Her daughter tells [place] that she is still having trouble with the pressure-relief mattress through medical modalities 02/03/18; I had the patient scheduled for a two week followup however  her daughter brought her in early concerned for discoloration on 2 areas of the wound circumference. We have bee using santyl 02/10/17;Better looking surface to the wound. Rim appears better suggesting better offloading. Using santyl 02/24/17; change to Silver Collegen last time. Wound appears better. 03/10/17; still using silver collagen religious offloading. Intake is satisfactory per her daughter. Dimension slightly better 03/12/2017 -- Dr. Jannetta Quint patient who had been seen 2 days ago and was doing fairly well. The patient is brought in by her daughter who noticed a new wound just above the previous wound on her sacral area and going on more to the left lateral side. She was very concerned and we asked her to get in for an opinion. 03/17/17; above is noted. The patient has developed a progressive area to the left of her original wound. This seems to this started with a ring of red skin with a more pale interior almost looking fungal. There was a rim of blister through part of the area although this did not look like zoster. They have been applying triamcinolone that was prescribed last week by Dr. Meyer Russel and the area has a fold to a linear band area which is confluent, erythematous and with obvious epidermal swelling but there is no overt tenderness or crepitus. She has lost some surface epithelium closer to the wound surface itself and now has a more superficial wound in this area and the extending  erythema goes towards the left buttock. This is well demarcated between involved in normal skin but once again does not appear to be at all tender. If there is a contact issue here I cannot get the history out of the daughter or the caregiver that are with her. 03/24/17; the patient arrives today with the wound slightly worse slightly more drainage. The bandlike area of erythema that I treated as a possible pineal infection has improved somewhat although proximally is still has confluent erythema without overt tenderness. We have been using silver alginate since the most recent deterioration. To the bandlike degree of erythema we have been using Lotrisone cream 03/31/17; patient arrives today with the wound slightly larger, necrotic surface and surrounding erythema. The bandlike area of erythema that I treated as a possible pineal infection is less swollen but still present I've been using Lotrisone cream on that largely related to the presence of a tinea looking infection when this was first seen. We've been using silver alginate. [name], [name] ([id number]) X-ray that I ordered last week showed no acute bony abnormalities mild fecal impaction. Lab work showed a comprehensive metabolic panel that was normal including an albumin of 3.8. White count was 9.5 hemoglobin 12.3 differential count normal. C-reactive protein was less than 1 and sedimentation rate and 17. The latter 2 values does not support an ongoing bacterial infection. 04/14/17; patient arrives after a 2 week hiatus. Her wound is not in good condition. Although the base of the wound looks stable she still has an erythematous area that was apparently blistered over the weekend. This again points to the left. As our intake nurse pointed out today this is in the area where the tissue folds together and we may need to prevent try to prevent this. Lab work and x-ray that I did to 3 weeks ago were unremarkable including her albumin nevertheless  she is an extremely frail condition physically. We have have been using [name] 04/22/17; I changed her to silver alginate because of the surrounding maceration and moisture last week. The daughter did not  like the way the wound looked in the middle of the week and changed her back to [place]. They're putting gauze on top of this. Thinks still using Lotrisone. 05/06/17 on evaluation today patient sacral wound appears to be doing okay and does not seem to be any worse. She is having no significant pain during evaluation today the secondary to mental status she is unable to rate or describe whether she had any pain she was not however flinching. Her daughter states that the wound does appear to be looking better to her. Still we are having difficulty with the skinfold that seems to be closing in on itself at this point. All in all I feel like she is making some good progress in the [name] seems to be the official for her. They do tell me that a refill if we are gonna continue that today. No fevers, chills, nausea, or vomiting noted at this time. 05/13/17 presents today for evaluation concerning her ongoing sacral pressure ulcer. Unfortunately she also has an area of deep tissue injury in the right Ischial region which is starting to show up. The sacral wound also continues to show signs of necrotic tissue overlying and has declined. Overall we really have not seen a significant improvement in the past several months in regard to the sacral wound and now patient is starting to develop a new wound in the right Ischial region. Obviously this is not trending in the direction that we want to see. No fevers, chills, nausea, or vomiting noted at this time. 05/19/17; I have not seen this wound and almost a month however there is nothing really positive to say about it. Necrotic tissue over the surface which superiorly I think abuts on her sacrum. She has surrounding erythema. I would be surprised if there is not  underlying osteomyelitis or soft tissue infection. 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 She apparently eats well per description although I wonder about this looking at her. Lab work I did probably 4 weeks ago however was really quite normal including a serum albumin 05/26/17; culture I did last week grew Escherichia coli and methicillin sensitive staph aureus which should've been well covered by the Augmentin and ciprofloxacin. Indeed the erythema around the wound in the bed of the wound looks somewhat better. Her intake is still satisfactory. They now have a near fluidized bed 06/02/17; they completed the antibiotics last Friday. Using collagen. Daughter still reports eating and drinking well. There is less visualized erythema around the wound. 06/16/17; large pressure ulcer over her lower sacrum and coccyx. Using [name] to the wound bed. 06/30/17; certainly no change in dimensions of this large stage III wound over her sacrum and coccyx. They've been using [name]. There is no exposed bone. She has a candidal/tinea area in the right inguinal area. Other than that her daughter relates that she is eating and drinking well there changing her positioning to make sure the areas offloaded 07/14/17; no major change in the dimensions of this large stage III wound. Initially a smaller wound that became secondarily infected causing significant tissue breakdown although it is been stable in the last several weeks. Tunneling superiorly at roughly 1:00 no change here either. There is no bone palpable. Both the patient's daughter and caretaker states that she eats well. I have not rechecked her blood work 07/27/17; patient arrives in clinic today and generally a deteriorated looking state. Mild fever with axillary temperature of 100.4. Daughter reports she has not been eating  and drinking well since yesterday. She looks more pale and thin and less responsive. We have been using silver collagen to  her wound 08/11/17; since the last time the patient was here things have gone better. Her fever went down and she started eating and drinking again. Culture I did of the wound showed methicillin sensitive staph aureus, Morganella and enterococcus. I only treated her with Keflex which would've not covered the Morganella and enterococcus however the purulent area on the 2:00 side of her wound is a lot better and the bandlike erythema that concern me also was resolved. I'd called the daughter last week to confirm that she was a lot better. She finished the Keflex last Thursday she also suffered a skin tear this morning perhaps while putting on her incontinence brief period is on the lateral aspect of her right leg. Clean wound with the surface epithelium not viable. 08/25/17; last week the patient was noted to have erythema around the wound margin and a slight fever which the patient's daughter says was 50. Our office was contacted by home health however we did not have a space to work the patient in that she went to see her primary physician Dr. Maurice March. She was not febrile during this visit on 08/21/17 there was erythema around the wound similar to last occasion. Dr. Maurice March in reference to my culture from 07/28/17 and put her on doxycycline capsules which they're opening twice a day for 10 days. [name], [name] ([id number]) 09/17/17; patient arrives today with the wound bed looking fairly well granulated. There is undermining from 4 to 6:00 although this seems to of contracted slightly. She does not have obvious infection although the daughter states there was some darkening of the wound circumference that is not evident today. They state she is eating well. They are concerned about oral thrush 09/30/16; very fibrin looking granulated wound bed. Her undermining from 4 to 6:00 is about the same but also appears to be well granulated. She has rolled edges of senescent tissue from roughly 7 to 12:00. There  is no evidence of infection 10/13/17 on evaluation today patient appears to be doing fairly well all things considered in regard to her sacral wound. There's really not a lot of significant change or improvement she does have some evidence of contusion and deep tissue injury around the left border of the wound that patient's daughter did inquire about today. Nonetheless overall the wound appears to be doing about the same in my opinion. There is no significant indication of infection there also is no significant slough noted at this point. 10/27/17 She is here in follow up evaluation of a sacral ulcer. She is accompanied by her daughter and caregiver. There is red granulation tissue throughout, persistent discoloration to left border; this appears consistent with deep tissue injury. There are multiple areas covered in foam borders, tegaderm, etc that are "preventative" with "no wounds". We will continue with prisma and continue with two week follow ups 11/17/17 on evaluation today patient appears to be doing decently well in regard to her wound at this point. She continues to have a sacral wound ulcer. We see her roughly every two weeks. In the last week she was actually in the hospital due to what was diagnosed as sepsis although no organisms were ever identified in the actual blood cultures. The hospital really was not sure that the wound was the cause of the infection but they really did not figure out anything else that would be  a causative organism she did have a CT scan and that revealed no evidence of pneumonia there was also no evidence of urinary tract infection. Again it very well could have been the wound called in those although wound appears to be doing fairly well at this point. 12/15/17 on evaluation today patient actually appears to be doing about the same in regard to her sacral ulcer. The one thing different is that she does seem to have a rash where her right arm is contracted and  being held to the thorax. The area underlying both on the ventral side of the arm as well is the thorax where it comes in contact shows evidence of a rash which appears to be fungal in nature. There does not appear to be any evidence of infection lies at this point. There does not appear to be a rash consistent with shingles which was also of concern initially. No fevers, chills, nausea, or vomiting noted at this time. 12/29/17 on evaluation today patient appears to be doing a little better in regard to her sacral wound although the one changes the 12 o'clock location of the wound seems to have attached at one point which no longer allows this to pull back. This has caused an area of undermining that seems to be attaching as well in the 12 o'clock location. I do think that this is something that can be managed and is not necessarily a bad thing. Nonetheless she does seem to have some pain with exploration of this region of undermining. She continues to have an area under her right arm on the chest wall of erythema although this is a little better especially after the home health nurse is actually got in order for Diflucan times one for the patient. She is a new area on the left wrist that looks like because of the contraction her nail may have pushed in on her wrist area causing a slight cut which subsequently became infected. She has some honey crusted drainage noted. 03/09/18 undervaluation today patient appears to be doing fairly well in regard to her wounds in general. She has been tolerating the dressing changes without complication. Overall I'm pleased with the progress that seems to be made week by week especially in regard to the sacral ulcer. Her daughter and the caregiver seem to take very good care of her. With that being said she has very little undermining in regard to the sacral wound in good granulation she also has good epithelialization noted. 03/23/18 on evaluation today patient  appears to be doing rather well in regard to her sacral wound. Unfortunately she does have a little bit of necrotic tissue noted in the central portion of her wound on the left elbow. This is due to the fact that she is lying on this arm seeing how it is contracted. Currently her daughter has started to avoid lying on the side it all due to the fact that the necrotic tissue was noted. With that being said they have been taking very good care of her in my pinion. Fortunately there does not appear to be any evidence of infection which is good news. Overall I'm pleased with the progress she's made other than in regard to the elbow. 04/06/18 on evaluation today patient actually appears to be doing okay in regard to the sacral wound. In fact it appears to be somewhat smaller in general although there does appear to be more undermining at the 6 o'clock location than was previously noted. She  has been tolerating the dressing changes without complication in general which is also good news. Nonetheless she does have a new small skin tear/opening on her leg which was evaluated today. Fortunately this appears to be minimal and the Xeroform gauze which has been used up to this point in the past has been utilized very effectively seems [name], Daylan W. ([id number]) to be doing well in that regard. Her left elbow ulcer seems to also be doing excellent at this point making good progress. 04/20/18 on evaluation today patient appears to be doing fairly well in regard to her sacral wound. This definitely seems to be better than during last evaluation where it was indeed infected. With that being said she has been tolerating the dressing changes as best it can be expected. Her elbow seems to be drying out and a lot of times the dressing is getting stuck according to her caregiver. 05/04/18-She is seen in follow-up evaluation for a sacral and left elbow pressure ulcer. These are stable/improved and we will continue with  same treatment plan and she will follow-up in 2 weeks 05/18/18 on evaluation today patient actually appears to be doing better in regard to her left elbow ulcer. The sacral wound is also shown signs of improvement which is good news. With that being said she does have a new right medial lower extremity ulcer which actually appears to show some signs of cellulitis/infection. She is previously taken Augmentin with good result. Nonetheless the patient really does not have anything that I can culture at the site there's actually some good epithelialization noted although again there is some cellulitis appearance as well. 06/01/18 on evaluation today patient appears to actually be doing very well in general in regard to her left elbow wound which is smaller in her lower extremity ulcer which is actually healed. With that being said the sacral wound in particular show signs of improvement although she still has undermining in the six-7 o'clock location. No fevers, chills, nausea, or vomiting noted at this time. 06/15/18 on evaluation today patient actually appears to be doing excellent in regard to her elbow ulcer on the left elbow. This is shown signs of great improvement and I do feel like she is very close to healing in this regard. In general her sacral wound also appears to be doing well there's good sign of improvement there as well as far as the overall surface of the wound is concerned. She does have a significant area of tunneling at the 6 o'clock location that still is about the same really there's no significant improvement in that regard. Nonetheless we are attempting to try and treat this by way of packing with Prisma followed by silver so to help control moisture. The patient been tolerating the dressing changes without complication. 06/29/18 on evaluation today patient actually appears to be doing very well in regard to her left elbow ulcer and her sacral ulcer. She has been tolerating the  dressing changes without complication which is good news. In fact the sacral wound appears to have almost completely closed although she does have an area of undermining/tunneling at roughly the 7 o'clock location that is still unfortunately having a more difficult time closing although as opposed to during the last visit I was unable to get a normal Q-tip down into the undermined region. Actually had to use a skinny probe or else the backside of the Q-tip were just a wooden stake was. I think this is a good sign that this area  seems to be filling in which is wonderful. Overall I'm pleased with how things are progressing. 07/13/18 on evaluation today patient appears to be doing a little worse in regard to the overall appearance of the sacral wound. She has been tolerating the dressing changes without complication. With that being said the patient's daughter tells me that the home health nurse actually dug around it significantly in the 12 o'clock location of the wound and subsequently reopen this area which was very well healed previous. This was a new nurse that hasn't generally been coming out to see the patient. Nonetheless they are very upset about this and the fact that the wound was actually worse when she was done "taking". It does appear that the wound is recovering and in fact since that point has closed quite significantly compared to what they tell me it looked like when she was done. 07/27/18 evaluation today patient appears to be doing well in regard to her elbow ulcer as well as the sacral ulcer. She has actually been doing excellent at this point in time. Fortunately there does not appear to be any evidence of infection currently. Overall I'm very happy with the progress at this time. She does have a couple other areas where there was some redness noted a fortunately there does not appear to be the evidence of more significant skin breakdown although there just slightly blanchable  regions that I think do need to be watched carefully. 08/10/18 upon evaluation today patient actually appears to be showing signs of improvement at all sites evaluated today. This is even true in the sacral region where she has less expensive undermining although there is still undermining noted unfortunately. They're having a very hard time being able to pack anything into this area. She may benefit from utilizing gentamicin cream and just a cover dressing. 08/24/18 on evaluation today patient sacral ulcer actually appears to be almost completely healed. She has done extremely well in this regard and I think this is in most part due to her family who has been excellent in taking care of her as well as her caregiver. Overall I'm extremely pleased in this regard. Her left shoulder area did open up since the last visit where I saw her although this was something that we potentially expected. Fortunately it does not appear to show any signs of significant infection overall I think that an alginate dressing would be very well at the site. [name], [name] ([id number]) 09/07/18 on evaluation today the patient actually appears to be completely healed in regard to all of her wounds. There is currently nothing remaining open at this point. She has been tolerating the dressing changes without complication. Overall I'm very pleased with how things have progressed. Patient's daughter is also extremely happy. 09/20/18 on evaluation today patient actually appears to be completely healed on evaluation today. This was true at the last visit as well but we wanted to continue to monitor in order to ensure that there was nothing that reopened or worsens in the interim. Everything appears to be doing great. Patient History Unable to Obtain Patient History due to Altered Mental Status. Information obtained from Patient. Social History Never smoker, Marital Status - Married, Alcohol Use - Never, Drug Use - No  History, Caffeine Use - Never. Medical And Surgical History Notes Ear/Nose/Mouth/Throat dysphagia Neurologic parkinsons, tremor Review of Systems (ROS) Constitutional Symptoms (General Health) Denies complaints or symptoms of Fever, Chills. Psychiatric The patient has no complaints or symptoms. Objective Constitutional Well-nourished  and well-hydrated in no acute distress. Vitals Time Taken: 10:13 AM, Height: 60 in, Weight: 90 lbs, BMI: 17.6, Temperature: 97.7 F, Pulse: 52 bpm, Respiratory Rate: 16 breaths/min, Blood Pressure: 120/64 mmHg. Respiratory normal breathing without difficulty. Psychiatric Patient is not able to cooperate in decision making regarding care. Patient has dementia. patient is confused. General Notes: Patient's wound bed at this point in time actually appears to show signs of excellent epithelialization at all locations. She is doing very well in my pinion. Overall I'm pleased with the progress. [name], [name] ([id number]) Assessment Active Problems ICD-10 Pressure ulcer of sacral region, stage 3 Pressure ulcer of left elbow, stage 3 Parkinson's disease Cellulitis of back [any part except buttock] Laceration without foreign body, right lower leg, subsequent encounter Plan Discharge From [facility] Services: Discharge from Wound Care Center I did have an extensive conversation with the patient's caregiver and daughter today regarding the fact that she does need to have continued aggressive offloading which again they do definitely do for her. Overall I feel like things are doing very well and I'm happy in this regard. Nonetheless I do believe that the patient needs to continue to be monitored very closely and I did discuss options for dressings to be used if there's anything minor that shows up. I will see if anything major shows that they know to contact our office we will get her in for further evaluation. Otherwise it's been a pleasure taking care of her and  I was very pleased to see that she finally healed after all this time. Electronic Signature(s) Signed: 10/17/2018 1:38:17 AM By: Lenda Kelp PA-C Previous Signature: 09/23/2018 10:16:39 PM Version By: Lenda Kelp PA-C Entered By: Lenda Kelp on 10/15/2018 08:29:18 [name] ([id number]) -------------------------------------------------------------------------------- ROS/PFSH Details Patient Name: [name] Date of Service: 09/20/2018 10:00 AM Medical Record Number: [id number] Patient Account Number: [id number] Date of Birth/Sex: [birth year] (84 y.o. F) Treating RN: Arnette Norris Primary Care Provider: Vonita Moss Other Clinician: Referring Provider: Vonita Moss Treating Provider/Extender: Linwood Dibbles, HOYT Weeks in Treatment: 61 Unable to Obtain Patient History due to oo Altered Mental Status Information Obtained From Patient Wound History Do you currently have one or more open woundso Yes How many open wounds do you currently haveo 1 Approximately how long have you had your woundso 1 week How have you been treating your wound(s) until nowo santyl and gauze Has your wound(s) ever healed and then re-openedo No Have you had any lab work done in the past montho No Have you tested positive for an antibiotic resistant organism (MRSA, VRE)o No Have you tested positive for osteomyelitis (bone infection)o No Have you had any tests for circulation on your legso No Constitutional Symptoms (General Health) Complaints and Symptoms: Negative for: Fever; Chills Eyes Medical History: Negative for: Cataracts; Glaucoma; Optic Neuritis Ear/Nose/Mouth/Throat Medical History: Negative for: Chronic sinus problems/congestion; Middle ear problems Past Medical History Notes: dysphagia Hematologic/Lymphatic Medical History: Positive for: Anemia Negative for: Hemophilia; Human Immunodeficiency Virus; Lymphedema; Sickle Cell Disease Respiratory Medical  History: Negative for: Aspiration; Asthma; Chronic Obstructive Pulmonary Disease (COPD); Pneumothorax; Sleep Apnea; Tuberculosis Cardiovascular Medical History: Positive for: Hypertension [name], [name]. ([id number]) Negative for: Angina; Arrhythmia; Congestive Heart Failure; Coronary Artery Disease; Deep Vein Thrombosis; Hypotension; Myocardial Infarction; Peripheral Arterial Disease; Peripheral Venous Disease; Phlebitis; Vasculitis Gastrointestinal Medical History: Negative for: Cirrhosis ; Colitis; Crohnos; Hepatitis A; Hepatitis B; Hepatitis C Endocrine Medical History: Negative for: Type I Diabetes; Type II Diabetes Genitourinary Medical History: Negative for:  End Stage Renal Disease Immunological Medical History: Negative for: Lupus Erythematosus; Raynaudos; Scleroderma Integumentary (Skin) Medical History: Negative for: History of Burn; History of pressure wounds Musculoskeletal Medical History: Negative for: Gout; Rheumatoid Arthritis; Osteoarthritis; Osteomyelitis Neurologic Medical History: Positive for: Dementia Negative for: Neuropathy; Quadriplegia; Paraplegia; Seizure Disorder Past Medical History Notes: parkinsons, tremor Oncologic Medical History: Negative for: Received Chemotherapy; Received Radiation Psychiatric Complaints and Symptoms: No Complaints or Symptoms Medical History: Negative for: Anorexia/bulimia; Confinement Anxiety Immunizations Pneumococcal Vaccine: Received Pneumococcal Vaccination: Yes Immunization Notes: up to date [name], [name] ([id number]) Implantable Devices Family and Social History Never smoker; Marital Status - Married; Alcohol Use: Never; Drug Use: No History; Caffeine Use: Never; Financial Concerns: No; Food, Clothing or Shelter Needs: No; Support System Lacking: No; Transportation Concerns: No; Advanced Directives: Yes (Not Provided); Patient does not want information on Advanced Directives; Medical Power of  Attorney: Yes - dtr (Not Provided) Physician Affirmation I have reviewed and agree with the above information. Electronic Signature(s) Signed: 09/21/2018 1:37:43 PM By: Arnette Norris Signed: 09/23/2018 10:16:39 PM By: Lenda Kelp PA-C Entered By: Lenda Kelp on 09/21/2018 10:14:54 [name] ([id number]) -------------------------------------------------------------------------------- SuperBill Details Patient Name: [name] Date of Service: 09/20/2018 Medical Record Number: [id number] Patient Account Number: [id number] Date of Birth/Sex: Apr 24, 1923 (84 y.o. F) Treating RN: Arnette Norris Primary Care Provider: Vonita Moss Other Clinician: Referring Provider: Vonita Moss Treating Provider/Extender: Linwood Dibbles, HOYT Weeks in Treatment: 66 Diagnosis Coding ICD-10 Codes Code Description L89.153 Pressure ulcer of sacral region, stage 3 L89.023 Pressure ulcer of left elbow, stage 3 G20 Parkinson's disease L03.312 Cellulitis of back [any part except buttock] S81.811D Laceration without foreign body, right lower leg, subsequent encounter Facility Procedures CPT4 Code: 08657846 Description: 99213 - WOUND CARE VISIT-LEV 3 EST PT Modifier: Quantity: 1 Physician Procedures CPT4 Code: 9629528 Description: 99213 - WC PHYS LEVEL 3 - EST PT ICD-10 Diagnosis Description L89.153 Pressure ulcer of sacral region, stage 3 L89.023 Pressure ulcer of left elbow, stage 3 G20 Parkinson's disease S81.811D Laceration without foreign body, right lower leg,  subseque Modifier: nt encounter Quantity: 1 Electronic Signature(s) Signed: 09/23/2018 10:16:39 PM By: Lenda Kelp PA-C Entered By: Lenda Kelp on 09/21/2018 10:16:16

## 2018-09-24 NOTE — Progress Notes (Signed)
[name], Jasneet W. ([id number]) Visit Report for 09/20/2018 Arrival Information Details Patient Name: [name], [name] Date of Service: 09/20/2018 10:00 AM Medical Record Number: [id number] Patient Account Number: [id number] Date of Birth/Sex: [birth year] (84 y.o. F) Treating RN: Arnette NorrisBiell, Kristina Primary Care Krissie Merrick: Vonita MossRISSMAN, MARK Other Clinician: Referring Oda Lansdowne: Vonita MossRISSMAN, MARK Treating Taylon Louison/Extender: Linwood DibblesSTONE III, HOYT Weeks in Treatment: 10688 Visit Information History Since Last Visit Added or deleted any medications: No Patient Arrived: Wheel Chair Any new allergies or adverse reactions: No Arrival Time: 10:00 Had a fall or experienced change in No Accompanied By: daughter and activities of daily living that may affect caregiver risk of falls: Transfer Assistance: Manual Signs or symptoms of abuse/neglect since last visito No Patient Identification Verified: Yes Hospitalized since last visit: No Secondary Verification Process Yes Implantable device outside of the clinic excluding No Completed: cellular tissue based products placed in the center Patient Requires Transmission-Based No since last visit: Precautions: Has Dressing in Place as Prescribed: Yes Patient Has Alerts: No Pain Present Now: No Electronic Signature(s) Signed: 09/20/2018 3:57:44 PM By: Dayton MartesWallace, RCP,RRT,CHT, Sallie RCP, RRT, CHT Entered By: Dayton MartesWallace, RCP,RRT,CHT, Sallie on 09/20/2018 10:13:11 [name], Chany W. ([id number]) -------------------------------------------------------------------------------- Clinic Level of Care Assessment Details Patient Name: [name], [name] Date of Service: 09/20/2018 10:00 AM Medical Record Number: [id number] Patient Account Number: [id number] Date of Birth/Sex: [birth year] (84 y.o. F) Treating RN: Arnette NorrisBiell, Kristina Primary Care Rogers Ditter: Vonita MossRISSMAN, MARK Other Clinician: Referring Cherylann Hobday: Vonita MossRISSMAN, MARK Treating Avyon Herendeen/Extender: Linwood DibblesSTONE III, HOYT Weeks in Treatment:  4988 Clinic Level of Care Assessment Items TOOL 4 Quantity Score []  - Use when only an EandM is performed on FOLLOW-UP visit 0 ASSESSMENTS - Nursing Assessment / Reassessment X - Reassessment of Co-morbidities (includes updates in patient status) 1 10 X- 1 5 Reassessment of Adherence to Treatment Plan ASSESSMENTS - Wound and Skin Assessment / Reassessment X - Simple Wound Assessment / Reassessment - one wound 1 5 []  - 0 Complex Wound Assessment / Reassessment - multiple wounds []  - 0 Dermatologic / Skin Assessment (not related to wound area) ASSESSMENTS - Focused Assessment []  - Circumferential Edema Measurements - multi extremities 0 []  - 0 Nutritional Assessment / Counseling / Intervention []  - 0 Lower Extremity Assessment (monofilament, tuning fork, pulses) []  - 0 Peripheral Arterial Disease Assessment (using hand held doppler) ASSESSMENTS - Ostomy and/or Continence Assessment and Care []  - Incontinence Assessment and Management 0 []  - 0 Ostomy Care Assessment and Management (repouching, etc.) PROCESS - Coordination of Care X - Simple Patient / Family Education for ongoing care 1 15 []  - 0 Complex (extensive) Patient / Family Education for ongoing care X- 1 10 Staff obtains ChiropractorConsents, Records, Test Results / Process Orders []  - 0 Staff telephones HHA, Nursing Homes / Clarify orders / etc []  - 0 Routine Transfer to another Facility (non-emergent condition) []  - 0 Routine Hospital Admission (non-emergent condition) []  - 0 New Admissions / Manufacturing engineernsurance Authorizations / Ordering NPWT, Apligraf, etc. []  - 0 Emergency Hospital Admission (emergent condition) X- 1 10 Simple Discharge Coordination [name], [name] ([id number]) []  - 0 Complex (extensive) Discharge Coordination PROCESS - Special Needs []  - Pediatric / Minor Patient Management 0 []  - 0 Isolation Patient Management []  - 0 Hearing / Language / Visual special needs []  - 0 Assessment of Community assistance  (transportation, D/C planning, etc.) []  - 0 Additional assistance / Altered mentation []  - 0 Support Surface(s) Assessment (bed, cushion, seat, etc.) INTERVENTIONS - Wound Cleansing / Measurement X - Simple Wound Cleansing - one  wound 1 5 []  - 0 Complex Wound Cleansing - multiple wounds X- 1 5 Wound Imaging (photographs - any number of wounds) []  - 0 Wound Tracing (instead of photographs) X- 1 5 Simple Wound Measurement - one wound []  - 0 Complex Wound Measurement - multiple wounds INTERVENTIONS - Wound Dressings X - Small Wound Dressing one or multiple wounds 1 10 []  - 0 Medium Wound Dressing one or multiple wounds []  - 0 Large Wound Dressing one or multiple wounds []  - 0 Application of Medications - topical []  - 0 Application of Medications - injection INTERVENTIONS - Miscellaneous []  - External ear exam 0 []  - 0 Specimen Collection (cultures, biopsies, blood, body fluids, etc.) []  - 0 Specimen(s) / Culture(s) sent or taken to Lab for analysis []  - 0 Patient Transfer (multiple staff / Nurse, adult / Similar devices) []  - 0 Simple Staple / Suture removal (25 or less) []  - 0 Complex Staple / Suture removal (26 or more) []  - 0 Hypo / Hyperglycemic Management (close monitor of Blood Glucose) []  - 0 Ankle / Brachial Index (ABI) - do not check if billed separately X- 1 5 Vital Signs Mulrooney, [name] ([id number]) Has the patient been seen at the hospital within the last three years: Yes Total Score: 85 Level Of Care: New/Established - Level 3 Electronic Signature(s) Signed: 09/21/2018 1:37:43 PM By: Arnette Norris Entered By: Arnette Norris on 09/20/2018 10:28:32 [name] ([id number]) -------------------------------------------------------------------------------- General Visit Notes Details Patient Name: [name] Date of Service: 09/20/2018 10:00 AM Medical Record Number: [id number] Patient Account Number: [id number] Date of Birth/Sex: October 08, 1922  (84 y.o. F) Treating RN: Curtis Sites Primary Care Aryahna Spagna: Vonita Moss Other Clinician: Referring Kaiulani Sitton: Vonita Moss Treating Tawona Filsinger/Extender: Linwood Dibbles, HOYT Weeks in Treatment: 19 Notes I assessed patient's healed ulcer sites and pressure points today and found no areas of concern or areas that are open Electronic Signature(s) Signed: 09/20/2018 4:55:08 PM By: Curtis Sites Entered By: Curtis Sites on 09/20/2018 10:17:56 [name] ([id number]) -------------------------------------------------------------------------------- Lower Extremity Assessment Details Patient Name: [name] Date of Service: 09/20/2018 10:00 AM Medical Record Number: [id number] Patient Account Number: [id number] Date of Birth/Sex: 1923/03/25 (84 y.o. F) Treating RN: Curtis Sites Primary Care Hiedi Touchton: Vonita Moss Other Clinician: Referring Taina Landry: Vonita Moss Treating Massiah Longanecker/Extender: Linwood Dibbles, HOYT Weeks in Treatment: 77 Electronic Signature(s) Signed: 09/20/2018 4:55:08 PM By: Curtis Sites Entered By: Curtis Sites on 09/20/2018 10:14:41 [name] ([id number]) -------------------------------------------------------------------------------- Multi Wound Chart Details Patient Name: [name] Date of Service: 09/20/2018 10:00 AM Medical Record Number: [id number] Patient Account Number: [id number] Date of Birth/Sex: [birth year] (84 y.o. F) Treating RN: Arnette Norris Primary Care Chenika Nevils: Vonita Moss Other Clinician: Referring Julliette Frentz: Vonita Moss Treating Timouthy Gilardi/Extender: Linwood Dibbles, HOYT Weeks in Treatment: 88 Vital Signs Height(in): 60 Pulse(bpm): 52 Weight(lbs): 90 Blood Pressure(mmHg): 120/64 Body Mass Index(BMI): 18 Temperature(F): 97.7 Respiratory Rate 16 (breaths/min): Wound Assessments Treatment Notes Electronic Signature(s) Signed: 09/21/2018 1:37:43 PM By: Arnette Norris Entered By: Arnette Norris on 09/20/2018  10:27:29 [name] ([id number]) -------------------------------------------------------------------------------- Multi-Disciplinary Care Plan Details Patient Name: [name] Date of Service: 09/20/2018 10:00 AM Medical Record Number: [id number] Patient Account Number: [id number] Date of Birth/Sex: 1923/05/12 (84 y.o. F) Treating RN: Arnette Norris Primary Care Kenon Delashmit: Vonita Moss Other Clinician: Referring Santino Kinsella: Vonita Moss Treating Sabeen Piechocki/Extender: Linwood Dibbles, HOYT Weeks in Treatment: 58 Active Inactive Electronic Signature(s) Signed: 09/21/2018 1:37:43 PM By: Arnette Norris Entered By: Arnette Norris on 09/20/2018 10:39:30 [name] ([id number]) --------------------------------------------------------------------------------  Pain Assessment Details Patient Name: [name], Quinnley W. Date of Service: 09/20/2018 10:00 AM Medical Record Number: [id number] Patient Account Number: [id number] Date of Birth/Sex: 03/19/1923 (84 y.o. F) Treating RN: Arnette NorrisBiell, Kristina Primary Care Kaiyla Stahly: Vonita MossRISSMAN, MARK Other Clinician: Referring Krue Peterka: Vonita MossRISSMAN, MARK Treating Madilynne Mullan/Extender: Linwood DibblesSTONE III, HOYT Weeks in Treatment: 6388 Active Problems Location of Pain Severity and Description of Pain Patient Has Paino No Site Locations Pain Management and Medication Current Pain Management: Electronic Signature(s) Signed: 09/20/2018 3:57:44 PM By: Dayton MartesWallace, RCP,RRT,CHT, Sallie RCP, RRT, CHT Signed: 09/21/2018 1:37:43 PM By: Arnette NorrisBiell, Kristina Entered By: Dayton MartesWallace, RCP,RRT,CHT, Sallie on 09/20/2018 10:13:19 [name], Emnet W. ([id number]) -------------------------------------------------------------------------------- Patient/Caregiver Education Details Patient Name: [name], Menucha W. Date of Service: 09/20/2018 10:00 AM Medical Record Number: [id number] Patient Account Number: [id number] Date of Birth/Gender: 03/19/1923 (84 y.o. F) Treating RN: Arnette NorrisBiell, Kristina Primary  Care Physician: Vonita MossRISSMAN, MARK Other Clinician: Referring Physician: Vonita MossRISSMAN, MARK Treating Physician/Extender: Skeet SimmerSTONE III, HOYT Weeks in Treatment: 4688 Education Assessment Education Provided To: Patient Education Topics Provided Electronic Signature(s) Signed: 09/21/2018 1:37:43 PM By: Arnette NorrisBiell, Kristina Entered By: Arnette NorrisBiell, Kristina on 09/20/2018 10:27:39 [name], Rosanne W. ([id number]) -------------------------------------------------------------------------------- Vitals Details Patient Name: [name], Katleen W. Date of Service: 09/20/2018 10:00 AM Medical Record Number: [id number] Patient Account Number: [id number] Date of Birth/Sex: 03/19/1923 (84 y.o. F) Treating RN: Arnette NorrisBiell, Kristina Primary Care Keyler Hoge: Vonita MossRISSMAN, MARK Other Clinician: Referring Dashon Mcintire: Vonita MossRISSMAN, MARK Treating Jabarri Stefanelli/Extender: Linwood DibblesSTONE III, HOYT Weeks in Treatment: 1688 Vital Signs Time Taken: 10:13 Temperature (F): 97.7 Height (in): 60 Pulse (bpm): 52 Weight (lbs): 90 Respiratory Rate (breaths/min): 16 Body Mass Index (BMI): 17.6 Blood Pressure (mmHg): 120/64 Reference Range: 80 - 120 mg / dl Electronic Signature(s) Signed: 09/20/2018 3:57:44 PM By: Dayton MartesWallace, RCP,RRT,CHT, Sallie RCP, RRT, CHT Entered By: Dayton MartesWallace, RCP,RRT,CHT, Sallie on 09/20/2018 10:13:59

## 2018-10-05 ENCOUNTER — Ambulatory Visit: Payer: Medicare Other | Admitting: Physician Assistant

## 2018-10-19 ENCOUNTER — Ambulatory Visit: Payer: Medicare Other | Admitting: Physician Assistant

## 2018-12-18 ENCOUNTER — Other Ambulatory Visit: Payer: Self-pay | Admitting: Family Medicine

## 2018-12-19 NOTE — Telephone Encounter (Signed)
Requested medication (s) are due for refill today: yes  Requested medication (s) are on the active medication list: yes  Last refill:  Last refilled by historical provider  Future visit scheduled: No  Notes to clinic:  Unable to refill per protocol. Medication last refilled by historical provider. LOV 03/10/18    Requested Prescriptions  Pending Prescriptions Disp Refills   metoprolol succinate (TOPROL-XL) 50 MG 24 hr tablet [Pharmacy Med Name: METOPROLOL SUCC ER 50 MG TAB] 90 tablet 3    Sig: TAKE 1 TABLET BY MOUTH EVERY DAY     Cardiovascular:  Beta Blockers Failed - 12/18/2018  1:25 PM      Failed - Last BP in normal range    BP Readings from Last 1 Encounters:  03/10/18 (!) 145/65         Failed - Valid encounter within last 6 months    Recent Outpatient Visits          9 months ago Parkinson's disease (HCC)   [facility] Crissman, Redge Gainer, [place]   1 year ago Pressure injury of skin of sacral region, unspecified injury stage   [facility] Particia Nearing, [place]   2 years ago Wound abscess, initial encounter   [facility] Steele Sizer, [place]   2 years ago Acute bronchitis with COPD [facility])   [facility] Crissman, Redge Gainer, [place]   2 years ago Cloudy urine   [facility] Crissman, Redge Gainer, [place]             Passed - Last Heart Rate in normal range    Pulse Readings from Last 1 Encounters:  03/10/18 60

## 2019-02-07 ENCOUNTER — Encounter: Payer: Self-pay | Admitting: Family Medicine

## 2019-02-07 ENCOUNTER — Ambulatory Visit (INDEPENDENT_AMBULATORY_CARE_PROVIDER_SITE_OTHER): Payer: Medicare Other | Admitting: Family Medicine

## 2019-02-07 ENCOUNTER — Other Ambulatory Visit: Payer: Self-pay

## 2019-02-07 ENCOUNTER — Other Ambulatory Visit: Payer: Self-pay | Admitting: Family Medicine

## 2019-02-07 DIAGNOSIS — I10 Essential (primary) hypertension: Secondary | ICD-10-CM

## 2019-02-07 DIAGNOSIS — G20 Parkinson's disease: Secondary | ICD-10-CM | POA: Diagnosis not present

## 2019-02-07 DIAGNOSIS — E43 Unspecified severe protein-calorie malnutrition: Secondary | ICD-10-CM

## 2019-02-07 DIAGNOSIS — N390 Urinary tract infection, site not specified: Secondary | ICD-10-CM | POA: Insufficient documentation

## 2019-02-07 DIAGNOSIS — N3000 Acute cystitis without hematuria: Secondary | ICD-10-CM | POA: Diagnosis not present

## 2019-02-07 MED ORDER — NITROFURANTOIN MONOHYD MACRO 100 MG PO CAPS: 100.0000 mg | ORAL_CAPSULE | Freq: Two times a day (BID) | ORAL | 0 refills | Status: DC

## 2019-02-07 NOTE — Assessment & Plan Note (Signed)
UTI symptoms will check urinalysis

## 2019-02-07 NOTE — Assessment & Plan Note (Signed)
The current medical regimen is effective;  continue present plan and medications. Discussed using metoprolol on a as needed basis which she is doing with good results but difficult to measure blood pressure due to patient's shaking.

## 2019-02-07 NOTE — Assessment & Plan Note (Signed)
The current medical regimen is effective;  continue present plan and medications.  

## 2019-02-07 NOTE — Assessment & Plan Note (Signed)
Discussed limited diet and chronic care management to assist with care.

## 2019-02-07 NOTE — Progress Notes (Signed)
BP 129/70    Subjective:    Patient ID: [name], female    DOB: 06-17-1923, 85 y.o.   MRN: [id number]  HPI: [name] is a 85 y.o. female  Declining health possible UTI  Telemedicine using audio/video telecommunications for a synchronous communication visit. Today's visit due to COVID-19 isolation precautions I connected with and verified that I am speaking with the correct person using two identifiers.   I discussed the limitations, risks, security and privacy concerns of performing an evaluation and management service by telecommunication and the availability of in person appointments. I also discussed with the patient that there may be a patient responsible charge related to this service. The patient expressed understanding and agreed to proceed. The patient's location is home and noncommunicative, discussed patient's care with her daughter. I am at home.  Discussed with patient's daughter declining health, patient remains nonverbal but not eating. Only taking small amount of liquids and urinating infrequently.  Daughter is concerned about possible UTI as 1 of her symptoms.  Has collected this morning about 2 tablespoons of urine. Patient's daughter will take this by the office to be used for urinalysis.  Discussed with daughter about chronic care management and she is amenable to their services. We will assess their services also mentioned hospice and/or palliative care.  Relevant past medical, surgical, family and social history reviewed and updated as indicated. Interim medical history since our last visit reviewed. Allergies and medications reviewed and updated.  Review of Systems  Unable to perform ROS: Patient nonverbal    Per HPI unless specifically indicated above     Objective:    BP 129/70   Wt Readings from Last 3 Encounters:  11/08/17 85 lb (38.6 kg)  10/13/16 96 lb 6.4 oz (43.7 kg)  06/23/16 110 lb (49.9 kg)    Physical Exam       Assessment & Plan:   Problem List Items Addressed This Visit      Cardiovascular and Mediastinum   Hypertension    The current medical regimen is effective;  continue present plan and medications. Discussed using metoprolol on a as needed basis which she is doing with good results but difficult to measure blood pressure due to patient's shaking.        Nervous and Auditory   Parkinson's disease [facility])    The current medical regimen is effective;  continue present plan and medications.       Relevant Orders   Referral to Chronic Care Management Services     Genitourinary   UTI (urinary tract infection)    UTI symptoms will check urinalysis      Relevant Orders   UA/M w/rflx Culture, Routine   Referral to Chronic Care Management Services     Other   Protein-calorie malnutrition, severe    Discussed limited diet and chronic care management to assist with care.      Relevant Orders   Referral to Chronic Care Management Services       I discussed the assessment and treatment plan with the patient. The patient was provided an opportunity to ask questions and all were answered. The patient agreed with the plan and demonstrated an understanding of the instructions.   The patient was advised to call back or seek an in-person evaluation if the symptoms worsen or if the condition fails to improve as anticipated.   I provided 21+ minutes of time during this encounter. Follow up plan: Return if symptoms worsen or  fail to improve, for As scheduled.

## 2019-02-07 NOTE — Addendum Note (Signed)
Addended by: Nils Pyle R on: 02/07/2019 11:19 AM   Modules accepted: Orders

## 2019-02-09 LAB — UA/M W/RFLX CULTURE, ROUTINE
Bilirubin, UA: NEGATIVE
Glucose, UA: NEGATIVE
Ketones, UA: NEGATIVE
Nitrite, UA: NEGATIVE
Protein,UA: NEGATIVE
Specific Gravity, UA: 1.01 (ref 1.005–1.030)
Urobilinogen, Ur: 0.2 mg/dL (ref 0.2–1.0)
pH, UA: 6.5 (ref 5.0–7.5)

## 2019-02-09 LAB — URINE CULTURE, REFLEX

## 2019-02-09 LAB — MICROSCOPIC EXAMINATION

## 2019-02-11 ENCOUNTER — Ambulatory Visit: Payer: Self-pay | Admitting: *Deleted

## 2019-02-11 ENCOUNTER — Telehealth: Payer: Medicare Other

## 2019-02-11 DIAGNOSIS — G20 Parkinson's disease: Secondary | ICD-10-CM

## 2019-02-11 DIAGNOSIS — I10 Essential (primary) hypertension: Secondary | ICD-10-CM

## 2019-02-11 NOTE — Patient Instructions (Signed)
Visit Information  Goals Addressed            This Visit's Progress   . Daughter- Goals of care concerns (pt-stated)       Current Barriers:  Tina Kitchen Knowledge Deficits related to Hospice vs Palliative services  Nurse Case Manager Clinical Goal(s):  Tina Kitchen Over the next 30 days, patient will work with [facility]  (community agency) to establish Palliative Care  Interventions:  . Collaborated with PCP  regarding patient's caregiver's desire to enlist Palliative care services with [facility] via self referral  . Discussed plans with patient for ongoing care management follow up and provided patient with direct contact information for care management team . Provided patient and/or caregiver with Contact  information about [place] related to caregiver would like to self refer (community resource).  . Discussed with daughter [name] the differences between Hospice and Palliative care. Daughter wanting to call palliative services for a referral.    Patient Self Care Activities:  . Currently UNABLE TO independently care for self, all ADLs and IADLs provided by care givers and family.  Initial goal documentation        [name] was given information about Chronic Care Management services today including:  1. CCM service includes personalized support from designated clinical staff supervised by her physician, including individualized plan of care and coordination with other care providers 2. 24/7 contact phone numbers for assistance for urgent and routine care needs. 3. Service will only be billed when office clinical staff spend 20 minutes or more in a month to coordinate care. 4. Only one practitioner may furnish and bill the service in a calendar month. 5. The patient may stop CCM services at any time (effective at the end of the month) by phone call to the office staff. 6. The patient will be responsible for cost sharing (co-pay) of up to 20% of the service  fee (after annual deductible is met).  Patient agreed to services and verbal consent obtained.   The patient verbalized understanding of instructions provided today and declined a print copy of patient instruction materials.   The CM team will reach out to the patient again over the next 14 days.   Merlene Morse Tina Cassar RN, BSN Nurse Case Editor, commissioning Family Practice/[facility] Care Management  [phone]) Business Mobile

## 2019-02-11 NOTE — Chronic Care Management (AMB) (Signed)
  Chronic Care Management   Outreach Note  02/11/2019 Name: [name] MRN: [id number] DOB: [birth year]  Referred by: Steele Sizer, MD Reason for referral : Chronic Care Management (Unsuccessful outreach)   An unsuccessful telephone outreach was attempted today. The patient was referred to the case management team by Dr. Dossie Arbour for assistance with chronic care management and care coordination.   Follow Up Plan: A HIPPA compliant phone message was left for the patient providing contact information and requesting a return call.  The CM team will reach out to the patient again over the next 14 days.   Ma Rings Freeda Spivey RN, BSN Nurse Case Education officer, community Family Practice/[facility] Care Management  [phone]) Business Mobile

## 2019-02-11 NOTE — Chronic Care Management (AMB) (Signed)
  Chronic Care Management   Initial Visit Note  02/11/2019 Name: [name] MRN: [id number] DOB: 10-24-1922  Referred by: Guadalupe Maple, MD Reason for referral : No chief complaint on file.   [name] is a 85 y.o. year old female who is a primary care patient of Crissman, Jeannette How, MD. The CCM team was consulted for assistance with chronic disease management and care coordination needs.   Review of patient status, including review of consultants reports, relevant laboratory and other test results, and collaboration with appropriate care team members and the patient's provider was performed as part of comprehensive patient evaluation and provision of chronic care management services.    SDOH (Social Determinants of Health) screening performed today. See Care Plan Entry related to challenges with: Physical Activity Goals of care, end of life  Objective:   Goals Addressed            This Visit's Progress   . Daughter- Goals of care concerns (pt-stated)       Current Barriers:  Marland Kitchen Knowledge Deficits related to Hospice vs Palliative services  Nurse Case Manager Clinical Goal(s):  Marland Kitchen Over the next 30 days, patient will work with [facility]  (community agency) to establish Palliative Care  Interventions:  . Collaborated with PCP  regarding patient's caregiver's desire to enlist Palliative care services with [facility] via self referral  . Discussed plans with patient for ongoing care management follow up and provided patient with direct contact information for care management team . Provided patient and/or caregiver with Contact  information about [facility] related to caregiver would like to self refer (community resource).  . Discussed with daughter Joseph Art the differences between Hospice and Palliative care. Daughter wanting to call palliative services for a referral.    Patient Self Care Activities:  . Currently UNABLE TO independently  care for self, all ADLs and IADLs provided by care givers and family.  Initial goal documentation         [name] was given information about Chronic Care Management services today including:  1. CCM service includes personalized support from designated clinical staff supervised by her physician, including individualized plan of care and coordination with other care providers 2. 24/7 contact phone numbers for assistance for urgent and routine care needs. 3. Service will only be billed when office clinical staff spend 20 minutes or more in a month to coordinate care. 4. Only one practitioner may furnish and bill the service in a calendar month. 5. The patient may stop CCM services at any time (effective at the end of the month) by phone call to the office staff. 6. The patient will be responsible for cost sharing (co-pay) of up to 20% of the service fee (after annual deductible is met).  Patient agreed to services and verbal consent obtained. (caregiver/Daughter)   Plan:   The CM team will reach out to the patient again over the next 14 days.  The patient has been provided with contact information for the chronic care management team and has been advised to call with any health related questions or concerns.   Merlene Morse Derrell Milanes RN, BSN Nurse Case Editor, commissioning Family Practice/[facility] Care Management  [phone]) Business Mobile

## 2019-02-17 ENCOUNTER — Other Ambulatory Visit: Payer: Self-pay | Admitting: Family Medicine

## 2019-02-17 NOTE — Progress Notes (Signed)
Chart review family now requesting palliative care in the patient's home which will be set up.  Palliative care has the order and will start.

## 2019-02-22 ENCOUNTER — Ambulatory Visit (INDEPENDENT_AMBULATORY_CARE_PROVIDER_SITE_OTHER): Payer: Medicare Other | Admitting: *Deleted

## 2019-02-22 ENCOUNTER — Ambulatory Visit: Payer: Medicare Other | Admitting: *Deleted

## 2019-02-22 DIAGNOSIS — I10 Essential (primary) hypertension: Secondary | ICD-10-CM | POA: Diagnosis not present

## 2019-02-22 DIAGNOSIS — G20 Parkinson's disease: Secondary | ICD-10-CM

## 2019-02-22 DIAGNOSIS — G20A1 Parkinson's disease without dyskinesia, without mention of fluctuations: Secondary | ICD-10-CM

## 2019-02-22 NOTE — Chronic Care Management (AMB) (Signed)
  Chronic Care Management   Follow Up Note   02/22/2019 Name: [name] MRN: [id number] DOB: Dec 14, 1922  Referred by: Steele Sizer, MD Reason for referral : Chronic Care Management (Parkinson's HTN) and Care Coordination (Palliative Care Consult)   [name] is a 85 y.o. year old female who is a primary care patient of Crissman, Redge Gainer, MD. The CCM team was consulted for assistance with chronic disease management and care coordination needs.    Review of patient status, including review of consultants reports, relevant laboratory and other test results, and collaboration with appropriate care team members and the patient's provider was performed as part of comprehensive patient evaluation and provision of chronic care management services.    Goals Addressed            This Visit's Progress   . COMPLETED: Daughter- Goals of care concerns (pt-stated)       Current Barriers:  Marland Kitchen Knowledge Deficits related to Hospice vs Palliative services  Nurse Case Manager Clinical Goal(s):  Marland Kitchen Over the next 30 days, patient will work with [facility]  (community agency) to establish Palliative Care  Interventions:  . Collaborated with PCP  regarding patient's caregiver's desire to enlist Palliative care services with [facility] via self referral  . Discussed plans with patient for ongoing care management follow up and provided patient with direct contact information for care management team . Provided patient and/or caregiver with Contact  information about [facility] related to caregiver would like to self refer (community resource).  . Discussed with daughter [name] the differences between Hospice and Palliative care. Daughter wanting to call palliative services for a referral.   . Spoke with daughter, she has contacted palliative and feels this will be enough support for her family. She has not heard from palliative care yet.  [name]  [facility] referral coordinator to confirm referral had been received, referral now in the system.  . No further CCM services requested  Patient Self Care Activities:  . Currently UNABLE TO independently care for self, all ADLs and IADLs provided by care givers and family.  Please see past updates related to this goal by clicking on the "Past Updates" button in the selected goal          The patient has been provided with contact information for the chronic care management team and has been advised to call with any health related questions or concerns.  No further follow up required: Patient's daughter/caregiver feels Palliative will be enough suppoet for patient at this time and denies any further CCM needs   Leeandre Nordling RN, BSN Nurse Case Education officer, community Family Practice/[facility] Care Management  [phone]) Business Mobile

## 2019-02-22 NOTE — Patient Instructions (Signed)
Thank you allowing the Chronic Care Management Team to be a part of your care! It was a pleasure speaking with you today!  CCM (Chronic Care Management) Team   Willmar Stockinger RN, BSN Nurse Care Coordinator  [phone]  [facility] PharmD  Clinical Pharmacist  [phone]  Dickie La LCSW Clinical Social Worker [phone]  Goals Addressed            This Visit's Progress   . COMPLETED: Daughter- Goals of care concerns (pt-stated)       Current Barriers:  Marland Kitchen Knowledge Deficits related to Hospice vs Palliative services  Nurse Case Manager Clinical Goal(s):  Marland Kitchen Over the next 30 days, patient will work with [facility]  (community agency) to establish Palliative Care  Interventions:  . Collaborated with PCP  regarding patient's caregiver's desire to enlist Palliative care services with [facility] via self referral  . Discussed plans with patient for ongoing care management follow up and provided patient with direct contact information for care management team . Provided patient and/or caregiver with Contact  information about [facility] related to caregiver would like to self refer (community resource).  . Discussed with daughter Luster Landsberg the differences between Hospice and Palliative care. Daughter wanting to call palliative services for a referral.   . Spoke with daughter, she has contacted palliative and feels this will be enough support for her family. She has not heard from palliative care yet.  Frederic Jer[facility] referral coordinator to confirm referral had been received, referral now in the system.  . No further CCM services requested  Patient Self Care Activities:  . Currently UNABLE TO independently care for self, all ADLs and IADLs provided by care givers and family.  Please see past updates related to this goal by clicking on the "Past Updates" button in the selected goal         The patient verbalized  understanding of instructions provided today and declined a print copy of patient instruction materials.   The patient has been provided with contact information for the chronic care management team and has been advised to call with any health related questions or concerns.  No further follow up required: Daughter/caregiver feels palliative care will provide needed support and denies any further CCM needs. Contact information provided in case further needs arise.

## 2019-02-22 NOTE — Chronic Care Management (AMB) (Signed)
  Chronic Care Management   Note  02/22/2019 Name: WYNNIE SCARFONE MRN: [id number] DOB: 1922-10-10  Erroneous encounter, see previous encounter dated for today.  Ma Rings Bunnie Lederman RN, BSN Nurse Case Education officer, community Family Practice/[facility] Care Management  [phone]) Business [employer]

## 2019-02-23 ENCOUNTER — Telehealth: Payer: Self-pay | Admitting: Nurse Practitioner

## 2019-02-23 NOTE — Telephone Encounter (Signed)
Talked with patient's daughter Luster Landsberg and have scheduled a Telehealth Palliative Consult for 03/07/19 @ 12:30 PM.

## 2019-03-07 ENCOUNTER — Other Ambulatory Visit: Payer: Medicare Other | Admitting: Nurse Practitioner

## 2019-03-07 ENCOUNTER — Other Ambulatory Visit: Payer: Self-pay

## 2019-03-07 ENCOUNTER — Encounter: Payer: Self-pay | Admitting: Nurse Practitioner

## 2019-03-07 DIAGNOSIS — Z515 Encounter for palliative care: Secondary | ICD-10-CM | POA: Insufficient documentation

## 2019-03-07 DIAGNOSIS — R5381 Other malaise: Secondary | ICD-10-CM

## 2019-03-07 DIAGNOSIS — R413 Other amnesia: Secondary | ICD-10-CM | POA: Insufficient documentation

## 2019-03-07 NOTE — Progress Notes (Signed)
[facility] Consult Note Telephone: [phone]  Fax: [phone]  PATIENT NAME: [name] DOB: 25-Mar-1923 MRN: [id number]  PRIMARY CARE PROVIDER:   Guadalupe Maple, MD  REFERRING PROVIDER:  Guadalupe Maple, MD [address] [place], [place] [address]  RESPONSIBLE PARTY:   Kenton Kingfisher [phone] Daughter  Due to the COVID-19 crisis, this visit was done via telemedicine from my office and it was initiated and consent by this patient and or family.  I was asked by Dr Jeananne Rama to complete a PC consult for goc.  RECOMMENDATIONS and PLAN:  1.Palliative care encounter Z51.5; Palliative medicine team will continue to support patient, patient's family, and medical team. Visit consisted of counseling and education dealing with the complex and emotionally intense issues of symptom management and palliative care in the setting of serious and potentially life-threatening illness  2. Memory loss R41.3 secondary to dementia. Continue with supportive measures as chronic disease remains Progressive.  3. Debility R53.81 secondary to Parkinson disease/dementia progressive, encourage passive rom; encourage to transfer to geri-chair.   ASSESSMENT:     Telemedicine visit with daughters [name]. We talked about purpose or palliative care visit. We talked about the last Sharyn Lull was at home independent about 5 years ago. We talked about past medical history including Parkinson's disease for which she was diagnosed about 10 years ago in addition to essential tremor. [name] endorses that the sentiment did help and she does remain on a low-dose at this time. We talked about disease progression of Parkinson's and dementia. We talked about events of time with functional transition. [name] endorses that she has been ambulatory up until about a year-and-a-half ago. At that time she's now been bed bound, requires turning and positioning with pillows. She  is contracted. She is total ADL care with incontinent bowel and bladder. She does require to be fed. She eats three meals daily including supplemental snacks. Daughters endorses appetite is good. She does not appear to be coughing or choking during eating or drinking. She is not pocketing food. Daughters endorse that she does not appear to have lost weight. We talked about Ms. Yamaguchi currently we talked about Ms. Marcon currently being treated for urinary tract infection. We talked about antibiotic therapy. We talked about a sacral wound she had for almost a year for which they were able to get healed. Daughters talked about increasing sleeping. Ms Ballinas spends most of her time  sleeping  as opposed to  several months ago she had more alert times. She mumbles and babbles and that has been for the last year-and-a-half with a clear word intermittently.  Daughters endorse they feel like she does know them. We talked about medical goals of care including aggressive versus conservative versus comfort care. We talked as a family what suffering looks like to them in addition to what comfort does. We talked about wishes to treat what is treatable though limited interventions. Wishes are for IV fluids and antibiotics but wishes are to limit hospitalizations, no surgery, no intubation. [name] endorses they did complete a most form. Asked [name] to email most form, will  can enter it into epic system. We talked about role of palliative care and plan of care. We talked about scheduling follow-up visit in one month if needed or sooner should she declined. Daughter's all in agreement and scheduled visit. Therapeutic listening and emotional support provided. Contact information provided. Questions answered to satisfaction.   I spent 60  minutes providing this consultation,  from 12:30pm  to 1:30pm. More than 50% of the time in this consultation was spent coordinating communication.   HISTORY OF PRESENT ILLNESS:  Elmarie Mainlandttalene W Ende is a  85 y.o. year old female with multiple medical problems including Parkinson's disease, dysphasia, tremor, mild dementia, hypertension, anemia, arthritis, B12 deficiency, left proximal humerus fracture 03/2016, abdominal hysterectomy, joint replacement, protein calorie malnutrition, B12 deficiency. She is currently enrolled in Chronic Care Management and care coordinator needs. Unable to independently care for herself all ADLs and iadls provided by caregiver, daughter. She does have a advance directive done 666 / 1417 / 2003 in Epic for Declaration of a desire for a natural death. She would not want her life to be prolonged by extraordinary means or artificial nutrition or hydration if condition determined to be terminal, incurable or diagnosis being in a persistent vegetative state. Palliative Care was asked to help address goals of care.   CODE STATUS: DNR  PPS: 30% HOSPICE ELIGIBILITY/DIAGNOSIS: TBD  PAST MEDICAL HISTORY:  Past Medical History:  Diagnosis Date   Anemia    Arm fracture 03/2016   left proximal humerus   Arthritis    B12 deficiency    Hypertension    Mild dementia (HCC)    Parkinson disease (HCC)    Tremor     SOCIAL HX:  Social History   Tobacco Use   Smoking status: Never Smoker   Smokeless tobacco: Never Used  Substance Use Topics   Alcohol use: No    Alcohol/week: 0.0 standard drinks    ALLERGIES:  Allergies  Allergen Reactions   Phenergan [Promethazine Hcl] Other (See Comments)    Family states the patient almost went into a coma.   Sulfa Antibiotics Other (See Comments)    Unknown reaction   Sulfasalazine Other (See Comments)    Unknown reaction     PERTINENT MEDICATIONS:  Outpatient Encounter Medications as of 03/07/2019  Medication Sig   acetaminophen (TYLENOL) 325 MG tablet Take 650 mg by mouth 2 (two) times daily as needed.    bisacodyl (DULCOLAX) 5 MG EC tablet Take 1 tablet (5 mg total) by mouth daily as needed for moderate  constipation.   Calcium Carbonate (CALCIUM-CARB 600 PO) Take 600 mg by mouth 2 (two) times daily.    Carbidopa-Levodopa ER (SINEMET CR) 25-100 MG tablet controlled release Take 1 tablet by mouth 2 (two) times daily.   Cholecalciferol (D3-1000 PO) Take 2 capsules by mouth daily.   ciprofloxacin (CILOXAN) 0.3 % ophthalmic solution Place 1 drop into both eyes every 2 (two) hours. Administer 1 drop, every 4 hours, while awake, until resolved.   docusate sodium (COLACE) 100 MG capsule Take 100 mg by mouth 2 (two) times daily.   metoprolol succinate (TOPROL-XL) 50 MG 24 hr tablet TAKE 1 TABLET BY MOUTH EVERY DAY   Multiple Vitamins-Minerals (MULTIVITAMIN ADULT PO) Take 1 tablet by mouth daily.    nitrofurantoin, macrocrystal-monohydrate, (MACROBID) 100 MG capsule Take 1 capsule (100 mg total) by mouth 2 (two) times daily.   nystatin (MYCOSTATIN) 100000 UNIT/ML suspension Take 5 mLs (500,000 Units total) by mouth 4 (four) times daily. Swish in mouth swallow or spit out which ever patient will do   nystatin (MYCOSTATIN/NYSTOP) powder APPLY TO AFFECTED AREA 4 TIMES A DAY   No facility-administered encounter medications on file as of 03/07/2019.     PHYSICAL EXAM:   Deferred Iesha Summerhill Z Tannor Pyon, NP

## 2019-03-31 ENCOUNTER — Encounter: Payer: Self-pay | Admitting: Nurse Practitioner

## 2019-03-31 ENCOUNTER — Ambulatory Visit (INDEPENDENT_AMBULATORY_CARE_PROVIDER_SITE_OTHER): Payer: Medicare Other | Admitting: Nurse Practitioner

## 2019-03-31 ENCOUNTER — Other Ambulatory Visit: Payer: Self-pay

## 2019-03-31 DIAGNOSIS — B354 Tinea corporis: Secondary | ICD-10-CM

## 2019-03-31 MED ORDER — FLUCONAZOLE 150 MG PO TABS: 150.0000 mg | ORAL_TABLET | Freq: Once | ORAL | 0 refills | Status: AC

## 2019-03-31 MED ORDER — NYSTATIN 100000 UNIT/GM EX POWD: CUTANEOUS | 3 refills | Status: AC

## 2019-03-31 NOTE — Patient Instructions (Signed)

## 2019-03-31 NOTE — Progress Notes (Signed)
Pulse 76    Temp (!) 97.3 F (36.3 C) (Axillary)    Ht 5\' 2"  (1.575 m)    Wt 95 lb (43.1 kg)    SpO2 99%    BMI 17.38 kg/m    Subjective:    Patient ID: [name], female    DOB: [birth year], 85 y.o.   MRN: [id number]  HPI: [name] is a 85 y.o. female  Chief Complaint  Patient presents with   Rash    Ongoing rash for over 1 year. Rash is under arms, groin, etc. Patient is bedridden and has contractures.      This visit was completed via Doximity due to the restrictions of the COVID-19 pandemic. All issues as above were discussed and addressed. Physical exam was done as above through visual confirmation on Doximity. If it was felt that the patient should be evaluated in the office, they were directed there. The patient verbally consented to this visit.  Location of the patient: home  Location of the provider: home  Those involved with this call:   Provider: Marnee Guarneri, DNP  CMA: Merilyn Baba, [place] Desk/Registration: Jill Side   Time spent on call: 15 minutes with patient face to face via video conference. More than 50% of this time was spent in counseling and coordination of care. 10 minutes total spent in review of patient's record and preparation of their chart. I verified patient identity using two factors (patient name and date of birth). Patient consents verbally to being seen via telemedicine visit today.   RASH Ongoing rash for over a year per her daughter and caregiver at bedside.  Has been using Nystatin powder, has helped but not 100% improved and they are now out of this.  In past have used one dose of Diflucan, which has improved areas.  Patient at baseline is bed bound and contracted upper and lower.  Prone to yeast infections under arms and in crevices or legs and groin.  They have palliative team coming in, but they have not come into home yet due to Covid pandemic and start date is in a couple weeks.  She has been followed by wound  care in past due to Stage 1 on sacrum per her daughter report.  Discussed at length treatment options with patient's daughter.  Discussed may need to switch from Nystatin to alternate antifungal if poor response.  Also educated on Poynor, which may benefit patient due to rash areas being in folds.  Duration:  chronic  Location: between arms, legs, and groin due to contractures Itching: no Burning: no Redness: yes Oozing: no Scaling: no Blisters: no Painful: no Fevers: no Change in detergents/soaps/personal care products: no Recent illness: no Recent travel:no History of same: yes Context: fluctuating Alleviating factors: Nystatin and Diflucan Treatments attempted: Nystatin and Diflucan Shortness of breath: no  Throat/tongue swelling: no Myalgias/arthralgias: no  Relevant past medical, surgical, family and social history reviewed and updated as indicated. Interim medical history since our last visit reviewed. Allergies and medications reviewed and updated.  Review of Systems  Constitutional: Negative for activity change, appetite change, diaphoresis, fatigue and fever.  Respiratory: Negative for cough, chest tightness and shortness of breath.   Cardiovascular: Negative for chest pain, palpitations and leg swelling.  Gastrointestinal: Negative for abdominal distention, abdominal pain, constipation, diarrhea, nausea and vomiting.  Skin: Positive for rash.  Psychiatric/Behavioral: Negative.     Per HPI unless specifically indicated above     Objective:  Pulse 76    Temp (!) 97.3 F (36.3 C) (Axillary)    Ht 5\' 2"  (1.575 m)    Wt 95 lb (43.1 kg)    SpO2 99%    BMI 17.38 kg/m   Wt Readings from Last 3 Encounters:  03/31/19 95 lb (43.1 kg)  11/08/17 85 lb (38.6 kg)  10/13/16 96 lb 6.4 oz (43.7 kg)    Physical Exam Vitals signs and nursing note reviewed.  Constitutional:      General: She is awake. She is not in acute distress.    Appearance: She is well-developed.      Comments: Frail, bed bound female  HENT:     Head: Normocephalic.     Right Ear: Hearing normal.     Left Ear: Hearing normal.  Eyes:     General: Lids are normal.        Right eye: No discharge.        Left eye: No discharge.     Conjunctiva/sclera: Conjunctivae normal.  Neck:     Musculoskeletal: Normal range of motion.  Cardiovascular:     Comments: Unable to auscultate due to virtual exam only  Pulmonary:     Effort: Pulmonary effort is normal. No accessory muscle usage or respiratory distress.     Comments: Unable to auscultate due to virtual exam only  Skin:    Comments: Patient with contractures to upper and lower extremities, with pillows propped for support.  Daughter able to show provider area in upper right thigh: area moderate size, bright red, flat rash (fungal in appearance) to crevice of upper thigh.  No breakdown or drainage noted.  Daughter reports same appearance under arms.  Neurological:     Mental Status: She is alert and oriented to person, place, and time.  Psychiatric:        Attention and Perception: Attention normal.        Mood and Affect: Mood normal.        Behavior: Behavior normal. Behavior is cooperative.        Thought Content: Thought content normal.        Judgment: Judgment normal.     Results for orders placed or performed in visit on 02/07/19  Microscopic Examination   URINE  Result Value Ref Range   WBC, UA 11-30 (A) 0 - 5 /hpf   RBC 0-2 0 - 2 /hpf   Epithelial Cells (non renal) 0-10 0 - 10 /hpf   Bacteria, UA Many (A) None seen/Few  Urine Culture, Reflex   URINE  Result Value Ref Range   Urine Culture, Routine Final report    Organism ID, Bacteria Comment   UA/M w/rflx Culture, Routine   Specimen: Urine   URINE  Result Value Ref Range   Specific Gravity, UA 1.010 1.005 - 1.030   pH, UA 6.5 5.0 - 7.5   Color, UA Yellow Yellow   Appearance Ur Cloudy (A) Clear   Leukocytes,UA 3+ (A) Negative   Protein,UA Negative  Negative/Trace   Glucose, UA Negative Negative   Ketones, UA Negative Negative   RBC, UA 1+ (A) Negative   Bilirubin, UA Negative Negative   Urobilinogen, Ur 0.2 0.2 - 1.0 mg/dL   Nitrite, UA Negative Negative   Microscopic Examination See below:    Urinalysis Reflex Comment       Assessment & Plan:   Problem List Items Addressed This Visit      Musculoskeletal and Integument   Tinea corporis  Patient bed bound with contractures upper and lower.  Rash in crevices upper thighs and under arms.  Refills sent on Nystatin powder and sent script for Diflucan x one dose.  Daughter to notify provider if no improvement in upcoming week.  May need to consider alternate to Nystatin powder, Miconazole.  Will also research cost and whether Tritec silver able to be obtained at local medical supply or pharmacy.        Relevant Medications   nystatin (MYCOSTATIN/NYSTOP) powder   fluconazole (DIFLUCAN) 150 MG tablet      I discussed the assessment and treatment plan with the patient. The patient was provided an opportunity to ask questions and all were answered. The patient agreed with the plan and demonstrated an understanding of the instructions.   The patient was advised to call back or seek an in-person evaluation if the symptoms worsen or if the condition fails to improve as anticipated.   I provided 15 minutes of time during this encounter.  Follow up plan: Return if symptoms worsen or fail to improve.

## 2019-03-31 NOTE — Assessment & Plan Note (Signed)
Patient bed bound with contractures upper and lower.  Rash in crevices upper thighs and under arms.  Refills sent on Nystatin powder and sent script for Diflucan x one dose.  Daughter to notify provider if no improvement in upcoming week.  May need to consider alternate to Nystatin powder, Miconazole.  Will also research cost and whether Tritec silver able to be obtained at local medical supply or pharmacy.

## 2019-04-05 ENCOUNTER — Ambulatory Visit: Payer: Medicare Other | Admitting: Pharmacist

## 2019-04-05 ENCOUNTER — Other Ambulatory Visit: Payer: Self-pay | Admitting: Nurse Practitioner

## 2019-04-05 ENCOUNTER — Telehealth: Payer: Self-pay

## 2019-04-05 DIAGNOSIS — B354 Tinea corporis: Secondary | ICD-10-CM

## 2019-04-05 NOTE — Chronic Care Management (AMB) (Signed)
  Chronic Care Management   Follow Up Note   04/05/2019 Name: MANDI MATTIOLI MRN: [id number] DOB: 1923/04/20  Referred by: Guadalupe Maple, MD Reason for referral : No chief complaint on file.   SALIMAH MARTINOVICH is a 85 y.o. year old female who is a primary care patient of Crissman, Jeannette How, MD. The CCM team was consulted for assistance with chronic disease management and care coordination needs.  Care coordination completed today.     Review of patient status, including review of consultants reports, relevant laboratory and other test results, and collaboration with appropriate care team members and the patient's provider was performed as part of comprehensive patient evaluation and provision of chronic care management services.    Goals Addressed            This Visit's Progress     Patient Stated   . "She has a hard time with yeast infections" (pt-stated)       Current Barriers:  . Recurrent topical yeast infections, tx Nystatin powder and courses of PO fluconazole. Primary care provider asked about the availability of Tritec silver would dressing to place in crevices  Pharmacist Clinical Goal(s):  Marland Kitchen Over the next 30 days, patient will work with CCM team to address needs related to appropriate wound medication management  Interventions: . Discussed with Janci Minor, RN CM. Sent an Epic inbasket to the Palliative Care provider, Natalia Leatherwood, NP. If they cannot help with this, will outreach DME suppliers in the area to investigate if this is a product they can order for the patient  Patient Self Care Activities:  . Calls provider office for new concerns or questions  Initial goal documentation        Plan:  - Will continue to collaborate with CCM team to support this patient  Catie Darnelle Maffucci, PharmD Clinical Pharmacist [place] [phone]

## 2019-04-05 NOTE — Telephone Encounter (Signed)
Messaged palliative care NP today to see if they can help supply.   If not, will plan to outreach DME suppliers around town.

## 2019-04-05 NOTE — Patient Instructions (Signed)
Visit Information  Goals Addressed            This Visit's Progress     Patient Stated   . "She has a hard time with yeast infections" (pt-stated)       Current Barriers:  . Recurrent topical yeast infections, tx Nystatin powder and courses of PO fluconazole. Primary care provider asked about the availability of Tritec silver would dressing to place in crevices  Pharmacist Clinical Goal(s):  Marland Kitchen Over the next 30 days, patient will work with CCM team to address needs related to appropriate wound medication management  Interventions: . Discussed with Janci Minor, RN CM. Sent an Epic inbasket to the Palliative Care provider, Natalia Leatherwood, NP. If they cannot help with this, will outreach DME suppliers in the area to investigate if this is a product they can order for the patient  Patient Self Care Activities:  . Calls provider office for new concerns or questions  Initial goal documentation        The patient verbalized understanding of instructions provided today and declined a print copy of patient instruction materials.   Plan:  - Will continue to collaborate with CCM team to support this patient   Catie Darnelle Maffucci, PharmD  Clinical Pharmacist  [place]  [phone]

## 2019-04-05 NOTE — Telephone Encounter (Signed)
Thank you.  I put in CCM referral too since you are all helping [place] with this and palliative has not been in home yet due to Covid, her daughter told me they do not come until mid-July I believe.

## 2019-04-05 NOTE — Telephone Encounter (Signed)
I will forward to Janci too, I asked Barnetta Chapel last week.  Patient not on CCM team, but need help seeing where we can get Tritec Silver locally ( medical supply or drug store).  It works well, we had at [place]. Not sure who can get here and what cost would be?

## 2019-04-05 NOTE — Telephone Encounter (Signed)
[name],  Before I call--is the daughter referring to Tritec Silver?

## 2019-04-05 NOTE — Telephone Encounter (Signed)
Thank you.  Yes, I think Tritec would be best if we can get it and it does not cost much.  I had a lot of success with yeast in bed bound patients in LTC.

## 2019-04-05 NOTE — Telephone Encounter (Signed)
Daughter notified. Stated the place is becoming larger and more red.  Daughter also asked about Silver Alginate, she got that for pressure wound before. If that was related but she still wants to pursue Tritec Silver.   Routing to provider and Catie, [facility]

## 2019-04-05 NOTE — Telephone Encounter (Signed)
Copied from [place] [id number]. Topic: General - Inquiry >> Apr 05, 2019 11:27 AM Sheran Luz wrote: Patient's daughter, Joseph Art, requesting call back from [place] to discuss a "silver film" for rash that was discussed at last visit with Marnee Guarneri, NP.

## 2019-04-05 NOTE — Progress Notes (Signed)
CCM referral to get assistance obtaining skin care needs

## 2019-04-07 ENCOUNTER — Other Ambulatory Visit: Payer: Medicare Other | Admitting: Nurse Practitioner

## 2019-04-07 ENCOUNTER — Other Ambulatory Visit: Payer: Self-pay | Admitting: Nurse Practitioner

## 2019-04-07 ENCOUNTER — Encounter: Payer: Self-pay | Admitting: Nurse Practitioner

## 2019-04-07 DIAGNOSIS — Z515 Encounter for palliative care: Secondary | ICD-10-CM

## 2019-04-07 DIAGNOSIS — R413 Other amnesia: Secondary | ICD-10-CM

## 2019-04-07 DIAGNOSIS — R5381 Other malaise: Secondary | ICD-10-CM

## 2019-04-07 MED ORDER — FLUCONAZOLE 150 MG PO TABS: ORAL_TABLET | ORAL | 1 refills | Status: DC

## 2019-04-07 MED ORDER — KETOCONAZOLE 2 % EX CREA: 1.0000 "application " | TOPICAL_CREAM | Freq: Every day | CUTANEOUS | 0 refills | Status: DC

## 2019-04-07 NOTE — Progress Notes (Signed)
[facility] Consult Note Telephone: [phone]  Fax: [phone]  PATIENT NAME: [name] DOB: 18-Jun-1923 MRN: [id number]  PRIMARY CARE PROVIDER:   Steele Sizerrissman, Mark A, MD  REFERRING PROVIDER:  Steele Sizerrissman, Mark A, MD [address] [place]  [place] [id number]  RESPONSIBLE PARTY:   [name] [phone] Daughter  Due to the COVID-19 crisis, this visit was done via telemedicine from my office and it was initiated and consent by this patient and or family.  RECOMMENDATIONS and PLAN:  1.Palliative care encounter Z51.5; Palliative medicine team will continue to support patient, patient's family, and medical team. Visit consisted of counseling and education dealing with the complex and emotionally intense issues of symptom management and palliative care in the setting of serious and potentially life-threatening illness  2. Memory loss R41.3 secondary to dementia. Continue with supportive measures as chronic disease remains Progressive.  3. Debility R53.81 secondary to Parkinson disease/dementia progressive, encourage passive rom; encourage to transfer to geri-chair.   ASSESSMENT:     Zoom meeting set for palliative care visit follow up with Ms. [name] three daughters Tina MaudlinRenee, [name], [name],. We talked about recent visit to Dr. Dossie [name] office for treatment of rash for which they receive Nystatin powder and one dose of Diflucan. Tina was able to expose the rash for review which is under abdominal folds, arm and in groin area. It does appear to be [name] tinea and discuss that it appears that they need to use [name] cream such as Nizoral to include [name] steroid component. We talked about also to further doses of Diflucan and would recommend 1 Q 72 hours for [name] total of three doses as this does appear to be multiple areas and enlarging. We talked about role of palliative care at length. [name] and [name] had multiple questions concerning when to call primary  provider and went to call palliative care. We talked at length about role of palliative care in plan of care to include goals of care, advance directives as she is currently [name] DNR. She does have [name] most form completed although not an active DNR form [place] in the home. Offered to complete this form and [name] endorses that they want to further discuss with Dr [name] as from their understanding the DNR on the most form was enough. We discussed that it would indicate to EMS and other healthcare providers that she is [name] DNR but also the [place] form is quick eye-catching and could be beneficial to have that as well in addition to putting that in Epic / Vynca. Tina endorses she would like to further discuss with her sisters and Dr [name]. We talked about the shawls debilitated state, progression of chronic disease of dementia. We also talked about her appetite which is good and does not appear to be losing weight. We talked about follow-up palliative care visit in two weeks if needed or sooner should she declined. We talked about follow up for rash to be done primary provider. Discussed I will contact Dr. Christell Faithrissman's office for recommendation for Nizoral cream and to further doses of Diflucan. If not cleared for [name] follow-up office visit next week. Tina in agreement. Schedule palliative care follow-up visit scheduled. Questions answered the satisfaction. Contact information provided. Therapeutic listening and emotional support provided.  I contacted Tina DialsJolene Cannady NP for update on palliative care visit and request for Nizoral, Diflucan and follow-up appointment. Also discuss DNR [place] form.   I spent 45 minutes providing this consultation,  from 12:00pm to 12:45pm. More than 50% of the time in this consultation was spent coordinating communication.   HISTORY OF PRESENT ILLNESS:  [name] is [name] 85 y.o. year old female with multiple medical problems including Parkinson's disease, dysphasia, tremor,  mild dementia, hypertension, anemia, arthritis, B12 deficiency, left proximal humerus fracture 03/2016, abdominal hysterectomy, joint replacement, protein calorie malnutrition, B12 deficiency. She is currently enrolled in Chronic Care Management and care coordinator needs. Unable to independently care for herself all ADLs and iadls provided by caregiver, daughter. She does have [name] advance directive done 39 / 87 / 2003 in [place] for Declaration of [name] desire for [name] natural death. She would not want her life to be prolonged by extraordinary means or artificial nutrition or hydration if condition determined to be terminal, incurable or diagnosis being in [name] persistent vegetative state. Seen primary provider [name] 7 / 2 / 2024 tinea corporis. Bed bound with contractures upper and lower with rash and crevices upper thighs, underarms. Diflucan prescribed in addition to Nystatin. Followed up by Chronic Care Management 7 / 7 / 2020. Palliative Care was asked to help to continue to address goals of care.   CODE STATUS:  DNR  PPS: 30% HOSPICE ELIGIBILITY/DIAGNOSIS: TBD  PAST MEDICAL HISTORY:  Past Medical History:  Diagnosis Date  . Anemia   . Arm fracture 03/2016   left proximal humerus  . Arthritis   . B12 deficiency   . Hypertension   . Mild dementia ([place])   . Parkinson disease ([place])   . Tremor     SOCIAL HX:  Social History   Tobacco Use  . Smoking status: Never Smoker  . Smokeless tobacco: Never Used  Substance Use Topics  . Alcohol use: No    Alcohol/week: 0.0 standard drinks    ALLERGIES:  Allergies  Allergen Reactions  . Phenergan [Promethazine Hcl] Other (See Comments)    Family states the patient almost went into [name] coma.  . Sulfa Antibiotics Other (See Comments)    Unknown reaction  . Sulfasalazine Other (See Comments)    Unknown reaction     PERTINENT MEDICATIONS:  Outpatient Encounter Medications as of 04/07/2019  Medication Sig  . acetaminophen (TYLENOL) 325 MG tablet  Take 650 mg by mouth 2 (two) times daily as needed.   . Calcium Carbonate (CALCIUM-CARB 600 PO) Take 600 mg by mouth 2 (two) times daily.   . Carbidopa-Levodopa ER (SINEMET CR) 25-100 MG tablet controlled release Take 1 tablet by mouth 2 (two) times daily.  . Cholecalciferol (D3-1000 PO) Take 2 capsules by mouth daily.  Marland Kitchen docusate sodium (COLACE) 100 MG capsule Take 100 mg by mouth 2 (two) times daily.  . fluconazole (DIFLUCAN) 150 MG tablet Take one tablet (150 MG) today and then take second tablet (150 MG) in 72 hours.  Marland Kitchen ketoconazole (NIZORAL) 2 % cream Apply 1 application topically daily.  . metoprolol succinate (TOPROL-XL) 50 MG 24 hr tablet TAKE 1 TABLET BY MOUTH EVERY DAY (Patient taking differently: Take 25 mg by mouth daily as needed. Patient takes half [name] tablet (25mg ) if heart rate is above 60. PRB)  . Multiple Vitamins-Minerals (MULTIVITAMIN ADULT PO) Take 1 tablet by mouth daily.   Marland Kitchen nystatin (MYCOSTATIN/NYSTOP) powder APPLY TO AFFECTED AREA 4 TIMES [name] DAY   No facility-administered encounter medications on file as of 04/07/2019.     PHYSICAL EXAM:   Deferred   Z , NP

## 2019-04-07 NOTE — Progress Notes (Signed)
Spoke to [place] with palliative.  Recommendations for repeat Diflucan x 2 and Nizoral.  Nystatin not benefiting current rash.

## 2019-04-08 ENCOUNTER — Other Ambulatory Visit: Payer: Self-pay

## 2019-04-08 ENCOUNTER — Ambulatory Visit: Payer: Medicare Other | Admitting: Pharmacist

## 2019-04-08 DIAGNOSIS — B354 Tinea corporis: Secondary | ICD-10-CM

## 2019-04-08 NOTE — Chronic Care Management (AMB) (Signed)
  Chronic Care Management   Follow Up Note   04/08/2019 Name: [name] MRN: [id number] DOB: [birth year]  Referred by: Guadalupe Maple, MD Reason for referral : Chronic Care Management (Medication Management)   [name] is a 85 y.o. year old female who is a primary care patient of Crissman, Jeannette How, MD. The CCM team was consulted for assistance with chronic disease management and care coordination needs.    Care coordination completed today.  Review of patient status, including review of consultants reports, relevant laboratory and other test results, and collaboration with appropriate care team members and the patient's provider was performed as part of comprehensive patient evaluation and provision of chronic care management services.    Goals Addressed            This Visit's Progress     Patient Stated   . "She has a hard time with yeast infections" (pt-stated)       Current Barriers:  . Recurrent topical yeast infections, tx Nystatin powder and courses of PO fluconazole. Primary care provider asked about the availability of Tritec silver would dressing to place in crevices  Pharmacist Clinical Goal(s):  Marland Kitchen Over the next 30 days, patient will work with CCM team to address needs related to appropriate wound medication management  Interventions: . Outreached the following local medical supply stores to determine if they carry [place]; Left voicemail to return my call today, or call Janci Minor, RN CM next week o [facility]; they do not carry the product, recommended I call [facility] o [facility] - could not order from their wholesalers o [facility]- could not order Tritec, they noted they could order "Dermshield", though this doesn't appear to contain silver o [facility] - could not order from their [place] - could not order from their wholesalers   . It appears this product is not an option at this time. Possibly consider referral to Wound Management for support in choosing and obtaining appropriate wound dressing options  Patient Self Care Activities:  . Calls provider office for new concerns or questions  Please see past updates related to this goal by clicking on the "Past Updates" button in the selected goal          Plan:  - Will continue to collaborate with CCM team in the care of this patient  Catie Darnelle Maffucci, PharmD Clinical Pharmacist [place] [phone]

## 2019-04-08 NOTE — Patient Instructions (Signed)
Visit Information  Goals Addressed            This Visit's Progress     Patient Stated   . "She has a hard time with yeast infections" (pt-stated)       Current Barriers:  . Recurrent topical yeast infections, tx Nystatin powder and courses of PO fluconazole. Primary care provider asked about the availability of Tritec silver would dressing to place in crevices  Pharmacist Clinical Goal(s):  Marland Kitchen Over the next 30 days, patient will work with CCM team to address needs related to appropriate wound medication management  Interventions: . Outreached the following local medical supply stores to determine if they carry [place]; Left voicemail to return my call today, or call Janci Minor, RN CM next week o [facility]; they do not carry the product, recommended I call [facility] o [facility] - could not order from their wholesalers o [facility]- could not order Tritec, they noted they could order "Dermshield", though this doesn't appear to contain silver o [facility] - could not order from their [place] - could not order from their wholesalers  . It appears this product is not an option at this time. Possibly consider referral to Wound Management for support in choosing and obtaining appropriate wound dressing options  Patient Self Care Activities:  . Calls provider office for new concerns or questions  Please see past updates related to this goal by clicking on the "Past Updates" button in the selected goal         The patient verbalized understanding of instructions provided today and declined a print copy of patient instruction materials.    Plan:  - Will continue to collaborate with CCM team in the care of this patient  Catie Darnelle Maffucci, PharmD Clinical Pharmacist [place] [phone]

## 2019-04-11 ENCOUNTER — Other Ambulatory Visit: Payer: Self-pay | Admitting: Nurse Practitioner

## 2019-04-14 ENCOUNTER — Encounter: Payer: Self-pay | Admitting: Nurse Practitioner

## 2019-04-14 ENCOUNTER — Other Ambulatory Visit: Payer: Medicare Other | Admitting: Nurse Practitioner

## 2019-04-14 ENCOUNTER — Other Ambulatory Visit: Payer: Self-pay | Admitting: Nurse Practitioner

## 2019-04-14 ENCOUNTER — Telehealth: Payer: Self-pay | Admitting: Family Medicine

## 2019-04-14 ENCOUNTER — Other Ambulatory Visit: Payer: Self-pay

## 2019-04-14 DIAGNOSIS — R413 Other amnesia: Secondary | ICD-10-CM

## 2019-04-14 DIAGNOSIS — R5381 Other malaise: Secondary | ICD-10-CM

## 2019-04-14 DIAGNOSIS — Z515 Encounter for palliative care: Secondary | ICD-10-CM

## 2019-04-14 MED ORDER — KETOCONAZOLE 2 % EX CREA: TOPICAL_CREAM | Freq: Two times a day (BID) | CUTANEOUS | 5 refills | Status: DC

## 2019-04-14 NOTE — Telephone Encounter (Signed)
Medication Refill - Medication:ketoconazole (NIZORAL) 2 % cream [[id number]]   Has the patient contacted their pharmacy? Yes.   (Agent: If no, request that the patient contact the pharmacy for the refill.) (Agent: If yes, when and what did the pharmacy advise?)  Preferred Pharmacy (with phone number or street name):  [id number] - [place], [place] - [address] [phone] (Phone) [phone] (Fax)   She is only in town until 2. Would like this refill ASAP. Thanks  Agent: Please be advised that RX refills may take up to 3 business days. We ask that you follow-up with your pharmacy.

## 2019-04-14 NOTE — Progress Notes (Signed)
[facility] Consult Note Telephone: [phone]  Fax: [phone]  PATIENT NAME: [name] Canales DOB: 1923/04/26 MRN: [id number]  PRIMARY CARE PROVIDER:   Steele Sizerrissman, Mark A, MD  REFERRING PROVIDER:  Steele Sizerrissman, Mark A, MD [address] [place] [id number]  RESPONSIBLE PARTY:   Arma HeadingRenee Downing [phone] Daughter  Due to the COVID-19 crisis, this visit was done via telemedicine from my office and it was initiated and consent by this patient and or family.  RECOMMENDATIONS and PLAN: 1.Palliative care encounter Z51.5; Palliative medicine team will continue to support patient, patient's family, and medical team. Visit consisted of counseling and education dealing with the complex and emotionally intense issues of symptom management and palliative care in the setting of serious and potentially life-threatening illness  2.Memory loss R41.3 secondary to dementia. Continue with supportive measures as chronic disease remains Progressive.  3.Debility R53.81 secondary toParkinson disease/dementiaprogressive, encourage passive rom; encourage to transfer to geri-chair.  ASSESSMENT:     I completed a telemedicine visit by video with Rockne Coonsenee, Trudy, [name], Ms HuronShaw's daughters. We talked about purpose of follow-up visit. We talked about the rash. Ms. Clelia CroftShaw did receive a total of 3 doses of Diflucan. They continue to use Ketoconazole cream and are mixing it with hydrocortisone over the counter. There is improvement with the rash as it is becoming lighter but the areas under the folds are difficult to get are too. I was able to visualize the rash in several folds, remains erythematous by video. Discuss that will continue to monitor and follow. If they noted that she has a fever or the rash starts to worsen to let Dr. Dossie Arbourrissman know. We talked about her appetite which continues to be good. She does not seem to be having difficulty with swallowing, choking  at this time. We talked about progression of Parkinson's. We talked about expectations that her swallowing may change with progression of disease. We discussed at present time that she does continue to remain stable in the setting of chronic disease of Parkinson's with severe functional debility. We talked about medical goals of care as most form is completed. We talked about option of also completing [place] DNR form. [name] endorses that they had discussed among a family and opted not to complete a DNR [place] form. They feel like at this time the MOST form is all they need. That is what they use with their father and EMS does honor that. MOST form is in Epic / Vynca. We talked about role of palliative care and plan of care. Discuss that scheduled a follow-up palliative care visit for 6 weeks if needed or sooner should she declined. Lincoln Brighamenee, [name] and Linglevillerudy all in agreement. Appointment is scheduled. Questions answered to satisfaction. Contact information provided.  I spent 30 minutes providing this consultation,  from 12:00pm to 12:30pm. More than 50% of the time in this consultation was spent coordinating communication.   HISTORY OF PRESENT ILLNESS:  [name] Perlman is a 85 y.o. year old female with multiple medical problems including Parkinson's disease, dysphasia, tremor, mild dementia, hypertension, anemia, arthritis, B12 deficiency, left proximal humerus fracture 03/2016, abdominal hysterectomy, joint replacement, protein calorie malnutrition, B12 deficiency. She is currently enrolled in Chronic Care Management and care coordinator needs. Palliative Care was asked to help to continue to address goals of care.   CODE STATUS: DNR (MOST FORM)  PPS: 30% HOSPICE ELIGIBILITY/DIAGNOSIS: TBD  PAST MEDICAL HISTORY:  Past Medical History:  Diagnosis Date  Anemia    Arm fracture 03/2016   left proximal humerus   Arthritis    B12 deficiency    Hypertension    Mild dementia (HCC)     Parkinson disease (HCC)    Tremor     SOCIAL HX:  Social History   Tobacco Use   Smoking status: Never Smoker   Smokeless tobacco: Never Used  Substance Use Topics   Alcohol use: No    Alcohol/week: 0.0 standard drinks    ALLERGIES:  Allergies  Allergen Reactions   Phenergan [Promethazine Hcl] Other (See Comments)    Family states the patient almost went into a coma.   Sulfa Antibiotics Other (See Comments)    Unknown reaction   Sulfasalazine Other (See Comments)    Unknown reaction     PERTINENT MEDICATIONS:  Outpatient Encounter Medications as of 04/14/2019  Medication Sig   acetaminophen (TYLENOL) 325 MG tablet Take 650 mg by mouth 2 (two) times daily as needed.    Calcium Carbonate (CALCIUM-CARB 600 PO) Take 600 mg by mouth 2 (two) times daily.    Carbidopa-Levodopa ER (SINEMET CR) 25-100 MG tablet controlled release Take 1 tablet by mouth 2 (two) times daily.   Cholecalciferol (D3-1000 PO) Take 2 capsules by mouth daily.   docusate sodium (COLACE) 100 MG capsule Take 100 mg by mouth 2 (two) times daily.   fluconazole (DIFLUCAN) 150 MG tablet Take one tablet (150 MG) today and then take second tablet (150 MG) in 72 hours.   ketoconazole (NIZORAL) 2 % cream Apply topically 2 (two) times daily.   metoprolol succinate (TOPROL-XL) 50 MG 24 hr tablet TAKE 1 TABLET BY MOUTH EVERY DAY (Patient taking differently: Take 25 mg by mouth daily as needed. Patient takes half a tablet (25mg ) if heart rate is above 60. PRB)   Multiple Vitamins-Minerals (MULTIVITAMIN ADULT PO) Take 1 tablet by mouth daily.    nystatin (MYCOSTATIN/NYSTOP) powder APPLY TO AFFECTED AREA 4 TIMES A DAY   No facility-administered encounter medications on file as of 04/14/2019.     PHYSICAL EXAM:  Deferred  Giannis Corpuz Z Darrien Belter, NP

## 2019-04-26 ENCOUNTER — Telehealth: Payer: Medicare Other

## 2019-05-26 ENCOUNTER — Other Ambulatory Visit: Payer: Medicare Other | Admitting: Nurse Practitioner

## 2019-05-26 ENCOUNTER — Other Ambulatory Visit: Payer: Self-pay

## 2019-05-26 ENCOUNTER — Encounter: Payer: Self-pay | Admitting: Nurse Practitioner

## 2019-05-26 DIAGNOSIS — Z515 Encounter for palliative care: Secondary | ICD-10-CM

## 2019-05-26 NOTE — Progress Notes (Signed)
[name] Telephone: [phone]  Fax: [phone]  PATIENT NAME: [name] DOB: 02/03/1923 MRN: [id number]  PRIMARY CARE PROVIDER:   Guadalupe Maple, MD  REFERRING PROVIDER:  Guadalupe Maple, MD [address] [place],  [place] [address]  RESPONSIBLE PARTY:   Kenton Kingfisher [phone] Daughter  Due to the COVID-19 crisis, this visit was done via telemedicine from my office and it was initiated and consent by this patient and or family.  RECOMMENDATIONS and PLAN: 1. ACP: DNR: MOST on file with Epic/Vynca; Comfort care focus  2.Memory loss R41.3 secondary to dementia. Continue with supportive measures as chronic disease remains Progressive.  3.Debility R53.81 secondary toParkinson disease  4. Palliative care encounter Z51.5; Palliative medicine team will continue to support patient, patient's family, and medical team. Visit consisted of counseling and education dealing with the complex and emotionally intense issues of symptom management and palliative care in the setting of serious and potentially life-threatening illness  I spent 30 minutes providing this consultation,  from 11:00am to 11:30am. More than 50% of the time in this consultation was spent coordinating communication.   HISTORY OF PRESENT ILLNESS:  [name] is a 85 y.o. year old female with multiple medical problems including Parkinson's disease, dysphasia, tremor, mild dementia, hypertension, anemia, arthritis, B12 deficiency, left proximal humerus fracture 03/2016, abdominal hysterectomy, joint replacement, protein calorie malnutrition, B12 deficiency. A follow-up palliative care scheduled visit by telemedicine Zoom with daughter Joseph Art. We talked about palliative care visit and [name] in agreement. We talked about how Ms. Olvey has been doing. [name] endorses that she seems to be about the same. She continues to have a good appetite. No recent Falls,  hospitalizations, infections. The red and areas under her arms, in her groin space has just about resolved. She has noted a red and spot on her buttocks buttock continue to use the cream that was given to them from the wound care center which seems to be helping it. We talked about topical antifungals versus oral to include Diflucan. Discussed define that she does develop a rash again in her folds of her skin and a trial of Diflucan is not successful then they consider a course of Flagyl. Just for a further recommendation if needed. She requires to be repositioned often by caregivers who stay with her. Her three daughters do stay with her and rotate weekly. We talked about currently Ms. Alessandrini is custodial care maintenance with progressive disease and severe cognitive impairment. Talked about quality of life and trying to make each day best it can be for her, goals to focus on comfort. We talked about her functional impairment bed-bound total ADL dependence with incontinence, requires to be fed, turned and positioned. She is severely cognitively impaired and does make words and sounds intermittently. At present time she does appear to be stable. We talked about palliative care follow-up visit and wish to continue to follow with next visit in 8 weeks if needed or sooner should she declined. [name] in agreement. Questions answered to satisfaction. Emotional support provided. Schedule follow-up Zoom telemedicine visit in two months and will use same invitation. Palliative Care was asked to help to continue to address goals of care.   CODE STATUS: DNR  PPS: 30% HOSPICE ELIGIBILITY/DIAGNOSIS: TBD  PAST MEDICAL HISTORY:  Past Medical History:  Diagnosis Date   Anemia    Arm fracture 03/2016   left proximal humerus   Arthritis    B12  deficiency    Hypertension    Mild dementia (HCC)    Parkinson disease (HCC)    Tremor     SOCIAL HX:  Social History   Tobacco Use   Smoking status: Never Smoker    Smokeless tobacco: Never Used  Substance Use Topics   Alcohol use: No    Alcohol/week: 0.0 standard drinks    ALLERGIES:  Allergies  Allergen Reactions   Phenergan [Promethazine Hcl] Other (See Comments)    Family states the patient almost went into a coma.   Sulfa Antibiotics Other (See Comments)    Unknown reaction   Sulfasalazine Other (See Comments)    Unknown reaction     PERTINENT MEDICATIONS:  Outpatient Encounter Medications as of 05/26/2019  Medication Sig   acetaminophen (TYLENOL) 325 MG tablet Take 650 mg by mouth 2 (two) times daily as needed.    Calcium Carbonate (CALCIUM-CARB 600 PO) Take 600 mg by mouth 2 (two) times daily.    Carbidopa-Levodopa ER (SINEMET CR) 25-100 MG tablet controlled release Take 1 tablet by mouth 2 (two) times daily.   Cholecalciferol (D3-1000 PO) Take 2 capsules by mouth daily.   docusate sodium (COLACE) 100 MG capsule Take 100 mg by mouth 2 (two) times daily.   fluconazole (DIFLUCAN) 150 MG tablet Take one tablet (150 MG) today and then take second tablet (150 MG) in 72 hours.   ketoconazole (NIZORAL) 2 % cream Apply topically 2 (two) times daily.   metoprolol succinate (TOPROL-XL) 50 MG 24 hr tablet TAKE 1 TABLET BY MOUTH EVERY DAY (Patient taking differently: Take 25 mg by mouth daily as needed. Patient takes half a tablet (25mg ) if heart rate is above 60. PRB)   Multiple Vitamins-Minerals (MULTIVITAMIN ADULT PO) Take 1 tablet by mouth daily.    nystatin (MYCOSTATIN/NYSTOP) powder APPLY TO AFFECTED AREA 4 TIMES A DAY   No facility-administered encounter medications on file as of 05/26/2019.     PHYSICAL EXAM:   Deferred  Ponciano Shealy Z Blythe Veach, NP

## 2019-06-01 ENCOUNTER — Other Ambulatory Visit: Payer: Self-pay

## 2019-06-01 ENCOUNTER — Other Ambulatory Visit: Payer: Self-pay | Admitting: Family Medicine

## 2019-06-01 ENCOUNTER — Encounter: Payer: Self-pay | Admitting: Family Medicine

## 2019-06-01 ENCOUNTER — Ambulatory Visit (INDEPENDENT_AMBULATORY_CARE_PROVIDER_SITE_OTHER): Payer: Medicare Other | Admitting: Family Medicine

## 2019-06-01 DIAGNOSIS — N3000 Acute cystitis without hematuria: Secondary | ICD-10-CM

## 2019-06-01 LAB — URINALYSIS, ROUTINE W REFLEX MICROSCOPIC
Bilirubin, UA: NEGATIVE
Glucose, UA: NEGATIVE
Ketones, UA: NEGATIVE
Nitrite, UA: NEGATIVE
Protein,UA: NEGATIVE
Specific Gravity, UA: 1.015 (ref 1.005–1.030)
Urobilinogen, Ur: 0.2 mg/dL (ref 0.2–1.0)
pH, UA: 7 (ref 5.0–7.5)

## 2019-06-01 LAB — MICROSCOPIC EXAMINATION

## 2019-06-01 MED ORDER — NITROFURANTOIN MONOHYD MACRO 100 MG PO CAPS: 100.0000 mg | ORAL_CAPSULE | Freq: Two times a day (BID) | ORAL | 0 refills | Status: DC

## 2019-06-01 NOTE — Assessment & Plan Note (Signed)
Discussed UTI and treatment use Macrobid 3 months ago successfully will prescribe again.

## 2019-06-01 NOTE — Progress Notes (Signed)
   There were no vitals taken for this visit.   Subjective:    Patient ID: [name], female    DOB: [birth year], 85 y.o.   MRN: [id number]  HPI: [name] is a 85 y.o. female  Behavioral changes Patient with behavioral changes that are usually associated with urinary tract infection patient unresponsive and unable to complain about UTIs.  Checking urinalysis today  Relevant past medical, surgical, family and social history reviewed and updated as indicated. Interim medical history since our last visit reviewed. Allergies and medications reviewed and updated.  Review of Systems  Per HPI unless specifically indicated above     Objective:    There were no vitals taken for this visit.  Wt Readings from Last 3 Encounters:  03/31/19 95 lb (43.1 kg)  11/08/17 85 lb (38.6 kg)  10/13/16 96 lb 6.4 oz (43.7 kg)    Physical Exam  Results for orders placed or performed in visit on 06/01/19  Microscopic Examination   URINE  Result Value Ref Range   WBC, UA 11-30 (A) 0 - 5 /hpf   RBC 3-10 (A) 0 - 2 /hpf   Epithelial Cells (non renal) 0-10 0 - 10 /hpf   Renal Epithel, UA 0-10 (A) None seen /hpf   Bacteria, UA Many (A) None seen/Few  Urinalysis, Routine w reflex microscopic  Result Value Ref Range   Specific Gravity, UA 1.015 1.005 - 1.030   pH, UA 7.0 5.0 - 7.5   Color, UA Yellow Yellow   Appearance Ur Clear Clear   Leukocytes,UA 2+ (A) Negative   Protein,UA Negative Negative/Trace   Glucose, UA Negative Negative   Ketones, UA Negative Negative   RBC, UA 1+ (A) Negative   Bilirubin, UA Negative Negative   Urobilinogen, Ur 0.2 0.2 - 1.0 mg/dL   Nitrite, UA Negative Negative   Microscopic Examination See below:       Assessment & Plan:   Problem List Items Addressed This Visit      Genitourinary   UTI (urinary tract infection)    Discussed UTI and treatment use Macrobid 3 months ago successfully will prescribe again.      Relevant Medications   nitrofurantoin, macrocrystal-monohydrate, (MACROBID) 100 MG capsule       Follow up plan: Return if symptoms worsen or fail to improve.

## 2019-06-20 ENCOUNTER — Telehealth: Payer: Self-pay

## 2019-06-20 NOTE — Telephone Encounter (Signed)
Please see message below.  Copied from [place] [phone]. Topic: General - Inquiry >> Jun 20, 2019 10:01 AM Scherrie Gerlach wrote: Reason for CRM:  1.daughter called to advise [facility] may contact you in regards to the pt's bed, even though pt's sacral wound has healed, her bed is medically necessary. They need office notes un order for insurance to pay. 2. Also would like to know how pt can get her flu shot this year?  If it can be prescribed, daughter would be willing to give to her.  She states Dr Jeananne Rama came to the house last year and gave to her, but she knows that won't happen this year.  Please advise.

## 2019-06-20 NOTE — Telephone Encounter (Signed)
Last note faxed. Copied from [place] [id number]. Topic: Medical Record Request - Provider/Facility Request >> Jun 20, 2019 10:02 AM Burchel, Abbi R wrote: Requestor Name/Agency: [facility]  Call Back #: [phone] ext [phone] Information Requested: most recent OV notes   Fx:  [phone]

## 2019-06-23 NOTE — Telephone Encounter (Signed)
Please do what is needed to do for the hospital bed.  Okay to give daughter a flu shot for her mom

## 2019-06-27 NOTE — Telephone Encounter (Signed)
Called and spoke with patients daughter, will write a prescription for a high dose flu vaccine to see if the pharmacy will dispense. Also she will contact [facility], to have them send [place] the paperwork for Dr.Crissman to sign.

## 2019-06-29 ENCOUNTER — Telehealth: Payer: Self-pay

## 2019-06-29 NOTE — Telephone Encounter (Signed)
Dr.Crissman this is in your signatures folder, they need a letter of medical necessity for the repair of her patient owned air fluidized therapy bed

## 2019-06-29 NOTE — Telephone Encounter (Signed)
Dr. Jeananne Rama- Duplicate request  Message already forwarded to [place]. Copied from [place] [phone]. Topic: Quick Communication - See Telephone Encounter >> Jun 29, 2019  3:37 PM Loma Boston wrote: CRM for notification. See Telephone encounter for: 06/29/19. [employer] Sharrie Rothman calling)  pt has requested a PA for a repair on a speciality bed, do you have a signed order on that? Cb at [phone]

## 2019-06-29 NOTE — Telephone Encounter (Signed)
Levada Dy with [facility] called in stating they have faxed over form for PCP to sign for patient to have an air bed. Please advise. Fax number is [phone], with Attention to Tammy.

## 2019-06-30 NOTE — Telephone Encounter (Signed)
Dr. Wynetta Emery will sign and is writing the Rx for hospital repair, but you have to write the letter of medical necessity because she doesn't know the patient and you are the PCP for insurance.

## 2019-06-30 NOTE — Telephone Encounter (Signed)
Routing to provider. Dr. Jeananne Rama, is the letter of medical necessity in Epic? I don't see it.

## 2019-06-30 NOTE — Telephone Encounter (Signed)
Did you write the letter in [place]?

## 2019-06-30 NOTE — Telephone Encounter (Signed)
done

## 2019-06-30 NOTE — Telephone Encounter (Signed)
Paperwork faxed °

## 2019-06-30 NOTE — Telephone Encounter (Signed)
Checking on the status of this form [facility], the deadline for this is at 3:00 today please call Angela--[phone] ext [phone]   Fax [phone] Urgent

## 2019-06-30 NOTE — Telephone Encounter (Signed)
Levada Dy is calling checking status of forms for [facility].    Call back [phone] FAX [phone]

## 2019-06-30 NOTE — Telephone Encounter (Signed)
Meg can sign as I wont be back this week.

## 2019-06-30 NOTE — Telephone Encounter (Signed)
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 [place] Birth date 03/10/2023. This patient is bedridden primarily because of advanced Parkinson's disease.  She has pressure injuries of her skin with resulting bedsores.  She requires the specialized bed because of these issues.  She has protein calorie malnutrition, memory defects, frequent urinary tract infections. Golden Pop MD

## 2019-07-03 NOTE — Telephone Encounter (Signed)
Yes see below

## 2019-07-21 ENCOUNTER — Encounter: Payer: Self-pay | Admitting: Nurse Practitioner

## 2019-07-21 ENCOUNTER — Other Ambulatory Visit: Payer: Self-pay

## 2019-07-21 ENCOUNTER — Other Ambulatory Visit: Payer: Medicare Other | Admitting: Nurse Practitioner

## 2019-07-21 DIAGNOSIS — Z515 Encounter for palliative care: Secondary | ICD-10-CM

## 2019-07-21 NOTE — Progress Notes (Signed)
[facility] Consult Note Telephone: [phone]  Fax: [phone]  PATIENT NAME: [name] DOB: Feb 13, 1923 MRN: [id number]  PRIMARY CARE PROVIDER:   Guadalupe Maple, MD  REFERRING PROVIDER:  Guadalupe Maple, MD [place],  [place] [address]   Kenton Kingfisher [phone] Daughter  Due to the COVID-19 crisis, this visit was done via telemedicine from my office and it was initiated and consent by this patient and or family.  RECOMMENDATIONS and PLAN: 1. ACP: DNR: MOST on file with Epic/Vynca; Comfort care focus  2.Debility secondary toParkinson disease  3. Palliative care encounter ; Palliative medicine team will continue to support patient, patient's family, and medical team. Visit consisted of counseling and education dealing with the complex and emotionally intense issues of symptom management and palliative care in the setting of serious and potentially life-threatening illness  I spent 35 minutes providing this consultation,  from 11:00am to 11:35am. More than 50% of the time in this consultation was spent coordinating communication.   HISTORY OF PRESENT ILLNESS:  [name] is a 85 y.o. year old female with multiple medical problems including Parkinson's disease, dysphasia, tremor, mild dementia, hypertension, anemia, arthritis, B12 deficiency, left proximal humerus fracture 03/2016, abdominal hysterectomy, joint replacement, protein calorie malnutrition, B12 deficiency. Schedule palliative care visit with [name] daughter Joseph Art by telemedicine video. We talked about purpose of visit and [name] in agreement. We talked about how much sauce has been feeling. [name] endorses overall [name] is comfortable. Family had a new caregiver last week which they ran into some obstacles. The caregiver had difficulty turning Ms Offner. A skin tear was created on her left upper arm for which family is doing wound care for. Red area  in skin folds have become more read where it wasn't cleaned as good. [name] endorses their regular caregiver has returned it and things are getting better. Appetite has been good. We talked about constipation diarrhea. [name] endorses [name] is feed her until they feel like your abdomen school and then wait to have a bowel movement and then feed her after that. It is a pattern family identified and it seems to work well. Family does use stool softener and Senna. When she has not had a bowel movement they also add Miralax. If she continues to have more diarrhea they do you add an Imodium which corrects the balance. We talked about one care. [name] showed the wound on her left upper forearm skin tear and reading area in the folds which seems to have worse than. We talked about continuing to use antifungal steroid cream and it continues to be worse than the notify primary provider may require Diflucan or even course of Flagyl. We talked about medical goals of care. Wishes are to continue to keep [name] at home and care for by 24-hour caregivers. Family does have an active MOST form in the home that says DNR. [name] talked about Dr. Luvenia Heller retiring but they will continue with [facility] practice for primary care. We talked about role of palliative care role and plan of care. Discuss follow-up visit in six weeks if needed or sooner should she declined. [name] in agreement. Questions answered the satisfaction. Contact information. Therapeutic listening and emotional support provided.  Palliative Care was asked to help to continue to address goals of care.   CODE STATUS:  DNR  PPS: 30% HOSPICE ELIGIBILITY/DIAGNOSIS: TBD  PAST MEDICAL HISTORY:  Past Medical History:  Diagnosis  Date   Anemia    Arm fracture 03/2016   left proximal humerus   Arthritis    B12 deficiency    Hypertension    Mild dementia (HCC)    Parkinson disease (HCC)    Tremor     SOCIAL HX:  Social History   Tobacco Use    Smoking status: Never Smoker   Smokeless tobacco: Never Used  Substance Use Topics   Alcohol use: No    Alcohol/week: 0.0 standard drinks    ALLERGIES:  Allergies  Allergen Reactions   Phenergan [Promethazine Hcl] Other (See Comments)    Family states the patient almost went into a coma.   Sulfa Antibiotics Other (See Comments)    Unknown reaction   Sulfasalazine Other (See Comments)    Unknown reaction     PERTINENT MEDICATIONS:  Outpatient Encounter Medications as of 07/21/2019  Medication Sig   acetaminophen (TYLENOL) 325 MG tablet Take 650 mg by mouth 2 (two) times daily as needed.    Calcium Carbonate (CALCIUM-CARB 600 PO) Take 600 mg by mouth 2 (two) times daily.    Carbidopa-Levodopa ER (SINEMET CR) 25-100 MG tablet controlled release Take 1 tablet by mouth 2 (two) times daily.   Cholecalciferol (D3-1000 PO) Take 2 capsules by mouth daily.   docusate sodium (COLACE) 100 MG capsule Take 100 mg by mouth 2 (two) times daily.   fluconazole (DIFLUCAN) 150 MG tablet Take one tablet (150 MG) today and then take second tablet (150 MG) in 72 hours.   ketoconazole (NIZORAL) 2 % cream Apply topically 2 (two) times daily.   metoprolol succinate (TOPROL-XL) 50 MG 24 hr tablet TAKE 1 TABLET BY MOUTH EVERY DAY (Patient taking differently: Take 25 mg by mouth daily as needed. Patient takes half a tablet (25mg ) if heart rate is above 60. PRB)   Multiple Vitamins-Minerals (MULTIVITAMIN ADULT PO) Take 1 tablet by mouth daily.    nitrofurantoin, macrocrystal-monohydrate, (MACROBID) 100 MG capsule Take 1 capsule (100 mg total) by mouth 2 (two) times daily.   nystatin (MYCOSTATIN/NYSTOP) powder APPLY TO AFFECTED AREA 4 TIMES A DAY   No facility-administered encounter medications on file as of 07/21/2019.     PHYSICAL EXAM:  Deferred Brookelyn Gaynor Z Maxten Shuler, NP

## 2019-09-08 ENCOUNTER — Encounter: Payer: Self-pay | Admitting: Nurse Practitioner

## 2019-09-08 ENCOUNTER — Other Ambulatory Visit: Payer: Self-pay | Admitting: Nurse Practitioner

## 2019-09-08 ENCOUNTER — Other Ambulatory Visit: Payer: Self-pay

## 2019-09-08 ENCOUNTER — Other Ambulatory Visit: Payer: Medicare Other | Admitting: Nurse Practitioner

## 2019-09-08 DIAGNOSIS — G20 Parkinson's disease: Secondary | ICD-10-CM

## 2019-09-08 DIAGNOSIS — Z515 Encounter for palliative care: Secondary | ICD-10-CM

## 2019-09-08 MED ORDER — CEPHALEXIN 250 MG PO CAPS: 250.0000 mg | ORAL_CAPSULE | Freq: Two times a day (BID) | ORAL | 0 refills | Status: AC

## 2019-09-08 MED ORDER — FLUCONAZOLE 150 MG PO TABS: ORAL_TABLET | ORAL | 1 refills | Status: DC

## 2019-09-08 MED ORDER — FLUCONAZOLE 150 MG PO TABS: 150.0000 mg | ORAL_TABLET | ORAL | 2 refills | Status: DC

## 2019-09-08 NOTE — Progress Notes (Signed)
Therapist, nutritional Palliative Care Consult Note Telephone: [phone]  Fax: [phone]  PATIENT NAME: [name] DOB: [birth year] MRN: [id number]  PRIMARY CARE PROVIDER:   Steele Sizer, MD  REFERRING PROVIDER:  Steele Sizer, MD [address] [place],  [place] [address]   [name] [phone] Daughter  Due to the COVID-19 crisis, this visit was done via telemedicine from my office and it was initiated and consent by this patient and or family.  RECOMMENDATIONS and PLAN: 1.ACP: DNR: MOST on file with Epic/Vynca; Comfort care focus  2.Debility secondary toParkinson disease  3.Palliative care encounter ; Palliative medicine team will continue to support patient, patient's family, and medical team. Visit consisted of counseling and education dealing with the complex and emotionally intense issues of symptom management and palliative care in the setting of serious and potentially life-threatening illness  I spent 45 minutes providing this consultation,  from 11:00am to 11:45am. More than 50% of the time in this consultation was spent coordinating communication.   HISTORY OF PRESENT ILLNESS:  [name] is a 85 y.o. year old female with multiple medical problems including Parkinson's disease, dysphasia, tremor, mild dementia, hypertension, anemia, arthritis, B12 deficiency, left proximal humerus fracture 03/2016, abdominal hysterectomy, joint replacement, protein calorie malnutrition, B12 deficiency. Telemedicine visit schedule with [name], [name]'s daughters by video follow up palliative care visit. [name] in agreement. We talked about how [name] has been feeling. We talked about red areas in folds. We talked about using creams that this Ketoconazole with steroid component vs powders and Diflucan. We talked about concerns for chronic UTIs. We talked about having the ability to dipstick a urine at home. [name] endorses they do have  the strips that they are able to identify. We talked about having an antibiotic on hand in case she does come up with a UTI on a weekend after hours. Her daughter [name] is a retired Teacher, early years/pre. [name] talked about her caution with using antibiotics. We discussed that will review with [name] Nurse Practitioner to see if that would be an option with an antibiotic such as Keflex. We talked about her bowel regimen of which they have difficulty regulating at times. She goes from having loose stools to constipation. Caregivers continue to monitor her abdomen to determine if she is full or not. We talked about chronic disease progression. We talked about her appetite. We talked about medical goals and hopes of keeping her at home, minimize hospitalizations. We talked about palliative care. We talked about follow-up visit in two months if needed or sooner Sibcy decline. Appointment schedule. Therapeutic listening and emotional support provided. Reassured that they were giving [name] [facility] at home with 24-hour caregivers and their support. Therapeutic listening and emotional support provided. Questions answered the satisfaction. Updated [name]  Palliative Care was asked to help to continue to address goals of care.   CODE STATUS: DNR  PPS: 30% HOSPICE ELIGIBILITY/DIAGNOSIS: TBD  PAST MEDICAL HISTORY:  Past Medical History:  Diagnosis Date   Anemia    Arm fracture 03/2016   left proximal humerus   Arthritis    B12 deficiency    Hypertension    Mild dementia (HCC)    Parkinson disease (HCC)    Tremor     SOCIAL HX:  Social History   Tobacco Use   Smoking status: Never Smoker   Smokeless tobacco: Never Used  Substance Use Topics   Alcohol use: No  Alcohol/week: 0.0 standard drinks    ALLERGIES:  Allergies  Allergen Reactions   Phenergan [Promethazine Hcl] Other (See Comments)    Family states the patient almost went into a coma.   Sulfa Antibiotics  Other (See Comments)    Unknown reaction   Sulfasalazine Other (See Comments)    Unknown reaction     PERTINENT MEDICATIONS:  Outpatient Encounter Medications as of 09/08/2019  Medication Sig   acetaminophen (TYLENOL) 325 MG tablet Take 650 mg by mouth 2 (two) times daily as needed.    Calcium Carbonate (CALCIUM-CARB 600 PO) Take 600 mg by mouth 2 (two) times daily.    Carbidopa-Levodopa ER (SINEMET CR) 25-100 MG tablet controlled release Take 1 tablet by mouth 2 (two) times daily.   Cholecalciferol (D3-1000 PO) Take 2 capsules by mouth daily.   docusate sodium (COLACE) 100 MG capsule Take 100 mg by mouth 2 (two) times daily.   ketoconazole (NIZORAL) 2 % cream Apply topically 2 (two) times daily.   metoprolol succinate (TOPROL-XL) 50 MG 24 hr tablet TAKE 1 TABLET BY MOUTH EVERY DAY (Patient taking differently: Take 25 mg by mouth daily as needed. Patient takes half a tablet (25mg ) if heart rate is above 60. PRB)   Multiple Vitamins-Minerals (MULTIVITAMIN ADULT PO) Take 1 tablet by mouth daily.    nitrofurantoin, macrocrystal-monohydrate, (MACROBID) 100 MG capsule Take 1 capsule (100 mg total) by mouth 2 (two) times daily.   nystatin (MYCOSTATIN/NYSTOP) powder APPLY TO AFFECTED AREA 4 TIMES A DAY   [DISCONTINUED] fluconazole (DIFLUCAN) 150 MG tablet Take one tablet (150 MG) today and then take second tablet (150 MG) in 72 hours.   No facility-administered encounter medications on file as of 09/08/2019.    PHYSICAL EXAM:  Deferred  [name]

## 2019-09-15 ENCOUNTER — Telehealth: Payer: Self-pay | Admitting: Family Medicine

## 2019-09-15 ENCOUNTER — Other Ambulatory Visit: Payer: Self-pay

## 2019-09-15 ENCOUNTER — Other Ambulatory Visit: Payer: Medicare Other

## 2019-09-15 DIAGNOSIS — R300 Dysuria: Secondary | ICD-10-CM

## 2019-09-15 NOTE — Telephone Encounter (Signed)
Copied from [facility] [phone]. Topic: General - Other >> Sep 15, 2019  2:06 PM Antonieta Iba C wrote: Reason for CRM: pt's daughter Joseph Art called in for assistance. Pt has palliative care and was suggested to drop off a urine by the office, so that pt could be prescribed a medication, pt has a possible UTI.     CB: [phone]- please assist

## 2019-09-15 NOTE — Telephone Encounter (Signed)
Can drop off urine with phone visit.

## 2019-09-16 ENCOUNTER — Other Ambulatory Visit: Payer: Self-pay

## 2019-09-16 ENCOUNTER — Encounter: Payer: Self-pay | Admitting: Nurse Practitioner

## 2019-09-16 ENCOUNTER — Ambulatory Visit (INDEPENDENT_AMBULATORY_CARE_PROVIDER_SITE_OTHER): Payer: Medicare Other | Admitting: Nurse Practitioner

## 2019-09-16 DIAGNOSIS — N3000 Acute cystitis without hematuria: Secondary | ICD-10-CM

## 2019-09-16 MED ORDER — AMOXICILLIN-POT CLAVULANATE 875-125 MG PO TABS: 1.0000 | ORAL_TABLET | Freq: Two times a day (BID) | ORAL | 0 refills | Status: AC

## 2019-09-16 MED ORDER — FLUCONAZOLE 150 MG PO TABS: 150.0000 mg | ORAL_TABLET | ORAL | 2 refills | Status: DC

## 2019-09-16 NOTE — Progress Notes (Signed)
There were no vitals taken for this visit.   Subjective:    Patient ID: [name], female    DOB: May 14, 1923, 85 y.o.   MRN: [id number]  HPI: [name] is a 85 y.o. female  Chief Complaint  Patient presents with  . Urinary Tract Infection    urine speciman done yesterday     . This visit was completed via telephone due to the restrictions of the COVID-19 pandemic. All issues as above were discussed and addressed but no physical exam was performed. If it was felt that the patient should be evaluated in the office, they were directed there. The patient verbally consented to this visit. Patient was unable to complete an audio/visual visit due to Lack of equipment. Due to the catastrophic nature of the COVID-19 pandemic, this visit was done through audio contact only. . Location of the patient: home . Location of the provider: home . Those involved with this call:  . Provider: Aura Dials, DNP . CMA: Wilhemena Durie, CMA . Front Desk/Registration: Harriet Pho  . Time spent on call: 15 minutes on the phone discussing health concerns. 10 minutes total spent in review of patient's record and preparation of their chart.  . I verified patient identity using two factors (patient name and date of birth). Patient consents verbally to being seen via telemedicine visit today.   Patient's daughter provided HPI and ROS.  URINARY SYMPTOMS Last treated for UTI on 06/01/2019 with Macrobid.  Patient at baseline is bed bound and contracted upper and lower.  Prone to yeast infections under arms and in crevices or legs and groin.  They have palliative team coming in.  Last week patient wanted to sleep more, yesterday was more alert, but not peeing as much.  Last GFR in 2019 was > 60.  Last week did not eat as much.   Dysuria: no Urinary frequency: no Urgency: no Small volume voids: yes Symptom severity: yes Urinary incontinence: yes Foul odor: no Hematuria: none Abdominal pain:  no Back pain: no Suprapubic pain/pressure: no Flank pain: no Fever:  no Vomiting: no Status: stable Previous urinary tract infection: yes Recurrent urinary tract infection: yes Treatments attempted: increasing their fluids  Relevant past medical, surgical, family and social history reviewed and updated as indicated. Interim medical history since our last visit reviewed. Allergies and medications reviewed and updated.  Review of Systems  Constitutional: Positive for appetite change and fatigue. Negative for activity change, diaphoresis and fever.  Respiratory: Negative.   Cardiovascular: Negative.   Gastrointestinal: Negative.   Genitourinary: Positive for decreased urine volume. Negative for dysuria, frequency and urgency.  Psychiatric/Behavioral: Negative.     Per HPI unless specifically indicated above     Objective:    There were no vitals taken for this visit.  Wt Readings from Last 3 Encounters:  03/31/19 95 lb (43.1 kg)  11/08/17 85 lb (38.6 kg)  10/13/16 96 lb 6.4 oz (43.7 kg)    Physical Exam   Unable to assess due to telephone only.  Results for orders placed or performed in visit on 09/15/19  Microscopic Examination   URINE  Result Value Ref Range   WBC, UA 6-10 (A) 0 - 5 /hpf   Epithelial Cells (non renal) 0-10 0 - 10 /hpf   Bacteria, UA Few (A) None seen/Few  Urine Culture, Reflex   URINE  Result Value Ref Range   Urine Culture, Routine WILL FOLLOW   UA/M w/rflx Culture, Routine   Specimen:  Urine   URINE  Result Value Ref Range   Specific Gravity, UA 1.020 1.005 - 1.030   pH, UA 7.0 5.0 - 7.5   Color, UA Yellow Yellow   Appearance Ur Hazy (A) Clear   Leukocytes,UA Trace (A) Negative   Protein,UA Negative Negative/Trace   Glucose, UA Negative Negative   Ketones, UA Negative Negative   RBC, UA Negative Negative   Bilirubin, UA Negative Negative   Urobilinogen, Ur 0.2 0.2 - 1.0 mg/dL   Nitrite, UA Negative Negative   Microscopic Examination  See below:    Urinalysis Reflex Comment       Assessment & Plan:   Problem List Items Addressed This Visit      Genitourinary   UTI (urinary tract infection)    UA positive for LEUK, bacteria, and WBC.  Will send for culture. Script for Augmentin sent in + additional Diflucan if needed (if yeast presents with abx use).  Adjust abx as needed based on culture results.  Continue increasing hydration at home and take a daily Ascorbic Acid 500 MG to assist in acidifying urine since patient is bed bound.  Return for worsening or ongoing symptoms.      Relevant Medications   fluconazole (DIFLUCAN) 150 MG tablet      I discussed the assessment and treatment plan with the patient. The patient was provided an opportunity to ask questions and all were answered. The patient agreed with the plan and demonstrated an understanding of the instructions.   The patient was advised to call back or seek an in-person evaluation if the symptoms worsen or if the condition fails to improve as anticipated.   I provided 15 minutes of time during this encounter.  Follow up plan: Return if symptoms worsen or fail to improve.

## 2019-09-16 NOTE — Assessment & Plan Note (Signed)
UA positive for LEUK, bacteria, and WBC.  Will send for culture. Script for Augmentin sent in + additional Diflucan if needed (if yeast presents with abx use).  Adjust abx as needed based on culture results.  Continue increasing hydration at home and take a daily Ascorbic Acid 500 MG to assist in acidifying urine since patient is bed bound.  Return for worsening or ongoing symptoms.

## 2019-09-16 NOTE — Patient Instructions (Signed)
Urinary Tract Infection, Adult A urinary tract infection (UTI) is an infection of any part of the urinary tract. The urinary tract includes:  The kidneys.  The ureters.  The bladder.  The urethra. These organs make, store, and get rid of pee (urine) in the body. What are the causes? This is caused by germs (bacteria) in your genital area. These germs grow and cause swelling (inflammation) of your urinary tract. What increases the risk? You are more likely to develop this condition if:  You have a small, thin tube (catheter) to drain pee.  You cannot control when you pee or poop (incontinence).  You are female, and: ? You use these methods to prevent pregnancy: ? A medicine that kills sperm (spermicide). ? A device that blocks sperm (diaphragm). ? You have low levels of a female hormone (estrogen). ? You are pregnant.  You have genes that add to your risk.  You are sexually active.  You take antibiotic medicines.  You have trouble peeing because of: ? A prostate that is bigger than normal, if you are female. ? A blockage in the part of your body that drains pee from the bladder (urethra). ? A kidney stone. ? A nerve condition that affects your bladder (neurogenic bladder). ? Not getting enough to drink. ? Not peeing often enough.  You have other conditions, such as: ? Diabetes. ? A weak disease-fighting system (immune system). ? Sickle cell disease. ? Gout. ? Injury of the spine. What are the signs or symptoms? Symptoms of this condition include:  Needing to pee right away (urgently).  Peeing often.  Peeing small amounts often.  Pain or burning when peeing.  Blood in the pee.  Pee that smells bad or not like normal.  Trouble peeing.  Pee that is cloudy.  Fluid coming from the vagina, if you are female.  Pain in the belly or lower back. Other symptoms include:  Throwing up (vomiting).  No urge to eat.  Feeling mixed up (confused).  Being tired  and grouchy (irritable).  A fever.  Watery poop (diarrhea). How is this treated? This condition may be treated with:  Antibiotic medicine.  Other medicines.  Drinking enough water. Follow these instructions at home:  Medicines  Take over-the-counter and prescription medicines only as told by your doctor.  If you were prescribed an antibiotic medicine, take it as told by your doctor. Do not stop taking it even if you start to feel better. General instructions  Make sure you: ? Pee until your bladder is empty. ? Do not hold pee for a long time. ? Empty your bladder after sex. ? Wipe from front to back after pooping if you are a female. Use each tissue one time when you wipe.  Drink enough fluid to keep your pee pale yellow.  Keep all follow-up visits as told by your doctor. This is important. Contact a doctor if:  You do not get better after 1-2 days.  Your symptoms go away and then come back. Get help right away if:  You have very bad back pain.  You have very bad pain in your lower belly.  You have a fever.  You are sick to your stomach (nauseous).  You are throwing up. Summary  A urinary tract infection (UTI) is an infection of any part of the urinary tract.  This condition is caused by germs in your genital area.  There are many risk factors for a UTI. These include having a small, thin   tube to drain pee and not being able to control when you pee or poop.  Treatment includes antibiotic medicines for germs.  Drink enough fluid to keep your pee pale yellow. This information is not intended to replace advice given to you by your health care provider. Make sure you discuss any questions you have with your health care provider. Document Released: 03/03/2008 Document Revised: 09/02/2018 Document Reviewed: 03/25/2018 Elsevier Patient Education  2020 [employer]  

## 2019-09-19 LAB — UA/M W/RFLX CULTURE, ROUTINE
Bilirubin, UA: NEGATIVE
Glucose, UA: NEGATIVE
Ketones, UA: NEGATIVE
Nitrite, UA: NEGATIVE
Protein,UA: NEGATIVE
RBC, UA: NEGATIVE
Specific Gravity, UA: 1.02 (ref 1.005–1.030)
Urobilinogen, Ur: 0.2 mg/dL (ref 0.2–1.0)
pH, UA: 7 (ref 5.0–7.5)

## 2019-09-19 LAB — URINE CULTURE, REFLEX

## 2019-09-19 LAB — MICROSCOPIC EXAMINATION

## 2019-11-10 ENCOUNTER — Other Ambulatory Visit: Payer: Medicare Other | Admitting: Nurse Practitioner

## 2019-11-10 ENCOUNTER — Other Ambulatory Visit: Payer: Self-pay

## 2019-11-10 ENCOUNTER — Encounter: Payer: Self-pay | Admitting: Nurse Practitioner

## 2019-11-10 DIAGNOSIS — G20 Parkinson's disease: Secondary | ICD-10-CM

## 2019-11-10 DIAGNOSIS — Z515 Encounter for palliative care: Secondary | ICD-10-CM

## 2019-11-10 NOTE — Progress Notes (Signed)
Therapist, nutritional Palliative Care Consult Note Telephone: [phone]  Fax: [phone]  PATIENT NAME: [name] DOB: [birth year] MRN: [id number]  PRIMARY CARE PROVIDER:   Steele Sizer, MD REFERRING PROVIDER:  Steele Sizer, MD [address] [place],  [place] [address]  Responsible: [name] [phone] Daughter  Due to the COVID-19 crisis, this visit was done via telemedicine from my office and it was initiated and consent by this patient and or family.  RECOMMENDATIONS and PLAN: 1.ACP: DNR: MOST on file with Epic/Vynca; Comfort care focus  2.Debility secondary toParkinson disease  3.Palliative care encounter ; Palliative medicine team will continue to support patient, patient's family, and medical team. Visit consisted of counseling and education dealing with the complex and emotionally intense issues of symptom management and palliative care in the setting of serious and potentially life-threatening illness  I spent 45 minutes providing this consultation,  from 12:00pm to 12:45pm. More than 50% of the time in this consultation was spent coordinating communication.   HISTORY OF PRESENT ILLNESS:  [name] is a 86 y.o. year old female with multiple medical problems including Parkinson's disease, dysphasia, tremor, mild dementia, hypertension, anemia, arthritis, B12 deficiency, left proximal humerus fracture 03/2016, abdominal hysterectomy, joint replacement, protein calorie malnutrition, B12 deficiency. Telemedicine visit schedule with Truett Perna, Tina. Pelley's daughters by video follow up palliative care visit. [name] in agreement. We talked about how [name] has been feeling. We talked about red areas in folds. We talked about using creams that this Ketoconazole with steroid component vs powders and Diflucan. We talked about concerns for chronic UTIs. I did a follow-up telemedicine video palliative care follow up visit with [name],  her daughter Angelique Blonder and [name]. We talked about purpose of visit. We talked about Tina. [name] has been doing. [name] endorses that she has been doing okay. Tina. [name] has had a another UTI. [name] endorses they were very grateful to have antibiotic on hand to start because it was difficult to get a urine though she was symptomatic. We talked about functional disability. We talked about antibiotic prophylaxis. Will send in Cipro 500 mg tid times 10 days in addition to Diflucan 150 Q 72 hours x 3 doses for recurrent yeast. Will put three refills on Diflucan. We discussed antibiotic resistance. 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 We talked about recurrent constipation and pattern. We talked about current bowel regimen. [name] talked at length about knowing when [name] is full do to her abdominal size. We talked about her appetite. [name] endorses they did take Tina. [name] to get her covid vaccine first and second dose at the [facility]. [name] endorses family members are scheduled to receive vaccine. We talked about medical goals of care, focus on comfort. We talked about role of palliative care and plan of care. Discuss next palliative care visit in 8 weeks if needed or sooner should Tina Bellino decline. [name] and Angelique Blonder both in agreement and appointment schedule. Therapeutic listening and emotional support provided. Contact information. Questions answered to satisfaction Palliative Care was asked to help to continue to address goals of care.   CODE STATUS: DNR  PPS: 30% HOSPICE ELIGIBILITY/DIAGNOSIS: TBD  PAST MEDICAL HISTORY:  Past Medical History:  Diagnosis Date  . Anemia   . Arm fracture 03/2016   left proximal humerus  . Arthritis   . B12 deficiency   . Hypertension   .  Mild dementia ([facility])   . Parkinson disease ([facility])   . Tremor     SOCIAL HX:  Social History   Tobacco Use  . Smoking status: Never Smoker  . Smokeless  tobacco: Never Used  Substance Use Topics  . Alcohol use: No    Alcohol/week: 0.0 standard drinks    ALLERGIES:  Allergies  Allergen Reactions  . Phenergan [Promethazine Hcl] Other (See Comments)    Family states the patient almost went into a coma.  . Sulfa Antibiotics Other (See Comments)    Unknown reaction  . Sulfasalazine Other (See Comments)    Unknown reaction     PERTINENT MEDICATIONS:  Outpatient Encounter Medications as of 11/10/2019  Medication Sig  . acetaminophen (TYLENOL) 325 MG tablet Take 650 mg by mouth 2 (two) times daily as needed.   . Calcium Carbonate (CALCIUM-CARB 600 PO) Take 600 mg by mouth 2 (two) times daily.   . Carbidopa-Levodopa ER (SINEMET CR) 25-100 MG tablet controlled release Take 1 tablet by mouth 2 (two) times daily.  . Cholecalciferol (D3-1000 PO) Take 2 capsules by mouth daily.  Marland Kitchen docusate sodium (COLACE) 100 MG capsule Take 100 mg by mouth 2 (two) times daily.  . fluconazole (DIFLUCAN) 150 MG tablet Take 1 tablet (150 mg total) by mouth every 3 (three) days. For three doses.  Marland Kitchen ketoconazole (NIZORAL) 2 % cream Apply topically 2 (two) times daily.  . metoprolol succinate (TOPROL-XL) 50 MG 24 hr tablet TAKE 1 TABLET BY MOUTH EVERY DAY (Patient taking differently: Take 25 mg by mouth daily as needed. Patient takes half a tablet (25mg ) if heart rate is above 60. PRB)  . Multiple Vitamins-Minerals (MULTIVITAMIN ADULT PO) Take 1 tablet by mouth daily.   Marland Kitchen nystatin (MYCOSTATIN/NYSTOP) powder APPLY TO AFFECTED AREA 4 TIMES A DAY (Patient not taking: Reported on 09/16/2019)  . senna (SENOKOT) 8.6 MG TABS tablet Take 1 tablet by mouth daily as needed for mild constipation.   No facility-administered encounter medications on file as of 11/10/2019.    PHYSICAL EXAM:   Deferred Nachman Sundt Z Adewale Pucillo, NP

## 2019-12-22 ENCOUNTER — Other Ambulatory Visit: Payer: Medicare Other | Admitting: Nurse Practitioner

## 2020-01-05 ENCOUNTER — Other Ambulatory Visit: Payer: Medicare Other | Admitting: Nurse Practitioner

## 2020-01-05 ENCOUNTER — Encounter: Payer: Self-pay | Admitting: Nurse Practitioner

## 2020-01-05 ENCOUNTER — Other Ambulatory Visit: Payer: Self-pay

## 2020-01-05 DIAGNOSIS — G20 Parkinson's disease: Secondary | ICD-10-CM

## 2020-01-05 DIAGNOSIS — Z515 Encounter for palliative care: Secondary | ICD-10-CM

## 2020-01-05 NOTE — Progress Notes (Addendum)
[facility] Consult Note Telephone: [phone]  Fax: [phone]  PATIENT NAME: [name] DOB: [birth year] MRN: [id number]  PRIMARY CARE PROVIDER:   Guadalupe Maple, MD  REFERRING PROVIDER:  Responsible: [name] [phone] Daughter  Due to the COVID-19 crisis, this visit was done via telemedicine from my office and it was initiated and consent by this patient and or family.  RECOMMENDATIONS and PLAN: 1.ACP: DNR: MOST on file with Epic/Vynca; Comfort care focus  Discussed hospice services, okay to review case with Hospice Physicians. Dr Gilford Rile [facility] Physician) agree with eligibility. I called [name] and updated. [name] will discuss with family, one concern is insurance under her husband's policy should she stabilize and d/c rather than passing would loose inability to continue policy.  2.Debility secondary toParkinson disease  3.Palliative care encounter ; Palliative medicine team will continue to support patient, patient's family, and medical team. Visit consisted of counseling and education dealing with the complex and emotionally intense issues of symptom management and palliative care in the setting of serious and potentially life-threatening illness  I spent 50 minutes providing this consultation,  From 11:00am to 11:50am. More than 50% of the time in this consultation was spent coordinating communication.   HISTORY OF PRESENT ILLNESS:  [name] is a 86 y.o. year old female with multiple medical problems including Parkinson's disease, dysphasia, tremor, mild dementia, hypertension, anemia, arthritis, B12 deficiency, left proximal humerus fracture 03/2016, abdominal hysterectomy, joint replacement, protein calorie malnutrition, B12 deficiency.Palliative care follow-up telemedicine is it by video with [name] 3 daughters [name] for follow up. We talked about palliative care. We talked about  Tina. [name] is doing. [name] [name] she is sleeping a lot. They are having to wake her up to feed her. It is taking greater than 45 minutes to eat. No signs of aspiration like coughing or choking during feeding. [name] it just has been taking a long time to feed her. [name] no visible weight loss noted but weight has been around 95 lb. [name] did measure [name]'s arm and thigh for muscle mass. We talked about appetite. We talked about challenges with her bowels as she is having more diarrhea stools for which they are having to give her Lomotil for. We talked about Lomotil causing more sleepiness. We talked about could be assisting with overall Decline and debility though concern  as Tina. [name] has been bed-bound for several years now. We talked about last UTI. We talked about reading areas in her folds that actually cleared up with two doses of Diflucan. We talked about red currants and likely will importance to keep areas opened affable. We talked about sacral area and great toe reading pressure point. We talked about keeping that up off the mattress. We talked about medical goals of care focus is to remain at home. We talked about hospice benefit under Medicare. We talked about what services would be provided. [name], all in agreement to have case review by Hospice Physician to see about eligibility. [name] they do not feel like she is at that point yet. We did talk about overall decline, sleeping more, taking longer to eat. Tina. [place] is severely impaired cognitively and functionally. We talked about role of palliative care and plan of care. [name] asked about hired private caregivers as an option has at times they have caregivers that aren't able to come. Discussed we do have a palliative social worker that can  assist. [name] they want to hold off on a social work consult for now and if they change their mind though notify to proceed. We talked about follow up palliative care  visit in 4 weeks. Will call [facility] when determined if hospice eligible so family can further discuss wishes and options. [name], [name] all in agreement. Appointment schedule. Therapeutic listening and emotional support provided. Contact information. Questions answer to satisfaction. Palliative Care was asked to help to continue to address goals of care.   CODE STATUS: DNR  PPS: 30% HOSPICE ELIGIBILITY/DIAGNOSIS: TBD  PAST MEDICAL HISTORY:  Past Medical History:  Diagnosis Date  . Anemia   . Arm fracture 03/2016   left proximal humerus  . Arthritis   . B12 deficiency   . Hypertension   . Mild dementia ([facility])   . Parkinson disease ([facility])   . Tremor     SOCIAL HX:  Social History   Tobacco Use  . Smoking status: Never Smoker  . Smokeless tobacco: Never Used  Substance Use Topics  . Alcohol use: No    Alcohol/week: 0.0 standard drinks    ALLERGIES:  Allergies  Allergen Reactions  . Phenergan [Promethazine Hcl] Other (See Comments)    Family states the patient almost went into a coma.  . Sulfa Antibiotics Other (See Comments)    Unknown reaction  . Sulfasalazine Other (See Comments)    Unknown reaction     PERTINENT MEDICATIONS:  Outpatient Encounter Medications as of 01/05/2020  Medication Sig  . acetaminophen (TYLENOL) 325 MG tablet Take 650 mg by mouth 2 (two) times daily as needed.   . Calcium Carbonate (CALCIUM-CARB 600 PO) Take 600 mg by mouth 2 (two) times daily.   . Carbidopa-Levodopa ER (SINEMET CR) 25-100 MG tablet controlled release Take 1 tablet by mouth 2 (two) times daily.  . Cholecalciferol (D3-1000 PO) Take 2 capsules by mouth daily.  Marland Kitchen docusate sodium (COLACE) 100 MG capsule Take 100 mg by mouth 2 (two) times daily.  . fluconazole (DIFLUCAN) 150 MG tablet Take 1 tablet (150 mg total) by mouth every 3 (three) days. For three doses.  Marland Kitchen ketoconazole (NIZORAL) 2 % cream Apply topically 2 (two) times daily.  . metoprolol succinate (TOPROL-XL) 50 MG 24 hr  tablet TAKE 1 TABLET BY MOUTH EVERY DAY (Patient taking differently: Take 25 mg by mouth daily as needed. Patient takes half a tablet (25mg ) if heart rate is above 60. PRB)  . Multiple Vitamins-Minerals (MULTIVITAMIN ADULT PO) Take 1 tablet by mouth daily.   nystatin (MYCOSTATIN/NYSTOP) powder APPLY TO AFFECTED AREA 4 TIMES A DAY (Patient not taking: Reported on 09/16/2019)  . senna (SENOKOT) 8.6 MG TABS tablet Take 1 tablet by mouth daily as needed for mild constipation.   No facility-administered encounter medications on file as of 01/05/2020.    PHYSICAL EXAM:  Deferred   Kaidynce Pfister Z Lowell Makara, NP

## 2020-01-25 ENCOUNTER — Ambulatory Visit (INDEPENDENT_AMBULATORY_CARE_PROVIDER_SITE_OTHER): Payer: Medicare Other

## 2020-01-25 DIAGNOSIS — Z0000 Encounter for general adult medical examination without abnormal findings: Secondary | ICD-10-CM

## 2020-01-25 NOTE — Patient Instructions (Signed)
[name] , Thank you for taking time to come for your Medicare Wellness Visit. I appreciate your ongoing commitment to your health goals. Please review the following plan we discussed and let me know if I can assist you in the future.   Screening recommendations/referrals: Colonoscopy: no longer required  Mammogram: no longer required  Bone Density: no longer required  Recommended yearly ophthalmology/optometry visit for glaucoma screening and checkup Recommended yearly dental visit for hygiene and checkup  Vaccinations: Influenza vaccine: up to date  Pneumococcal vaccine: up to date Tdap vaccine: due now  Shingles vaccine: shingrix eligible    Covid-19: completed   Advanced directives: Please bring a copy of your health care power.   Conditions/risks identified: Discussed chronic care management team. Please call me if you need anything [phone] or would like to get in touch with them.   Next appointment: Follow up in one year for your annual wellness visit.    Preventive Care 7 Years and Older, Female Preventive care refers to lifestyle choices and visits with your health care provider that can promote health and wellness. What does preventive care include?  A yearly physical exam. This is also called an annual well check.  Dental exams once or twice a year.  Routine eye exams. Ask your health care provider how often you should have your eyes checked.  Personal lifestyle choices, including:  Daily care of your teeth and gums.  Regular physical activity.  Eating a healthy diet.  Avoiding tobacco and drug use.  Limiting alcohol use.  Practicing safe sex.  Taking low-dose aspirin every day.  Taking vitamin and mineral supplements as recommended by your health care provider. What happens during an annual well check? The services and screenings done by your health care provider during your annual well check will depend on your age, overall health, lifestyle risk  factors, and family history of disease. Counseling  Your health care provider may ask you questions about your:  Alcohol use.  Tobacco use.  Drug use.  Emotional well-being.  Home and relationship well-being.  Sexual activity.  Eating habits.  History of falls.  Memory and ability to understand (cognition).  Work and work Astronomer.  Reproductive health. Screening  You may have the following tests or measurements:  Height, weight, and BMI.  Blood pressure.  Lipid and cholesterol levels. These may be checked every 5 years, or more frequently if you are over 35 years old.  Skin check.  Lung cancer screening. You may have this screening every year starting at age 11 if you have a 30-pack-year history of smoking and currently smoke or have quit within the past 15 years.  Fecal occult blood test (FOBT) of the stool. You may have this test every year starting at age 70.  Flexible sigmoidoscopy or colonoscopy. You may have a sigmoidoscopy every 5 years or a colonoscopy every 10 years starting at age 27.  Hepatitis C blood test.  Hepatitis B blood test.  Sexually transmitted disease (STD) testing.  Diabetes screening. This is done by checking your blood sugar (glucose) after you have not eaten for a while (fasting). You may have this done every 1-3 years.  Bone density scan. This is done to screen for osteoporosis. You may have this done starting at age 60.  Mammogram. This may be done every 1-2 years. Talk to your health care provider about how often you should have regular mammograms. Talk with your health care provider about your test results, treatment options, and  if necessary, the need for more tests. Vaccines  Your health care provider may recommend certain vaccines, such as:  Influenza vaccine. This is recommended every year.  Tetanus, diphtheria, and acellular pertussis (Tdap, Td) vaccine. You may need a Td booster every 10 years.  Zoster vaccine. You  may need this after age 84.  Pneumococcal 13-valent conjugate (PCV13) vaccine. One dose is recommended after age 74.  Pneumococcal polysaccharide (PPSV23) vaccine. One dose is recommended after age 47. Talk to your health care provider about which screenings and vaccines you need and how often you need them. This information is not intended to replace advice given to you by your health care provider. Make sure you discuss any questions you have with your health care provider. Document Released: 10/12/2015 Document Revised: 06/04/2016 Document Reviewed: 07/17/2015 Elsevier Interactive Patient Education  2017 [place] Prevention in the Home Falls can cause injuries. They can happen to people of all ages. There are many things you can do to make your home safe and to help prevent falls. What can I do on the outside of my home?  Regularly fix the edges of walkways and driveways and fix any cracks.  Remove anything that might make you trip as you walk through a door, such as a raised step or threshold.  Trim any bushes or trees on the path to your home.  Use bright outdoor lighting.  Clear any walking paths of anything that might make someone trip, such as rocks or tools.  Regularly check to see if handrails are loose or broken. Make sure that both sides of any steps have handrails.  Any raised decks and porches should have guardrails on the edges.  Have any leaves, snow, or ice cleared regularly.  Use sand or salt on walking paths during winter.  Clean up any spills in your garage right away. This includes oil or grease spills. What can I do in the bathroom?  Use night lights.  Install grab bars by the toilet and in the tub and shower. Do not use towel bars as grab bars.  Use non-skid mats or decals in the tub or shower.  If you need to sit down in the shower, use a plastic, non-slip stool.  Keep the floor dry. Clean up any water that spills on the floor as soon as  it happens.  Remove soap buildup in the tub or shower regularly.  Attach bath mats securely with double-sided non-slip rug tape.  Do not have throw rugs and other things on the floor that can make you trip. What can I do in the bedroom?  Use night lights.  Make sure that you have a light by your bed that is easy to reach.  Do not use any sheets or blankets that are too big for your bed. They should not hang down onto the floor.  Have a firm chair that has side arms. You can use this for support while you get dressed.  Do not have throw rugs and other things on the floor that can make you trip. What can I do in the kitchen?  Clean up any spills right away.  Avoid walking on wet floors.  Keep items that you use a lot in easy-to-reach places.  If you need to reach something above you, use a strong step stool that has a grab bar.  Keep electrical cords out of the way.  Do not use floor polish or wax that makes floors slippery. If you  must use wax, use non-skid floor wax.  Do not have throw rugs and other things on the floor that can make you trip. What can I do with my stairs?  Do not leave any items on the stairs.  Make sure that there are handrails on both sides of the stairs and use them. Fix handrails that are broken or loose. Make sure that handrails are as long as the stairways.  Check any carpeting to make sure that it is firmly attached to the stairs. Fix any carpet that is loose or worn.  Avoid having throw rugs at the top or bottom of the stairs. If you do have throw rugs, attach them to the floor with carpet tape.  Make sure that you have a light switch at the top of the stairs and the bottom of the stairs. If you do not have them, ask someone to add them for you. What else can I do to help prevent falls?  Wear shoes that:  Do not have high heels.  Have rubber bottoms.  Are comfortable and fit you well.  Are closed at the toe. Do not wear sandals.  If  you use a stepladder:  Make sure that it is fully opened. Do not climb a closed stepladder.  Make sure that both sides of the stepladder are locked into place.  Ask someone to hold it for you, if possible.  Clearly mark and make sure that you can see:  Any grab bars or handrails.  First and last steps.  Where the edge of each step is.  Use tools that help you move around (mobility aids) if they are needed. These include:  Canes.  Walkers.  Scooters.  Crutches.  Turn on the lights when you go into a dark area. Replace any light bulbs as soon as they burn out.  Set up your furniture so you have a clear path. Avoid moving your furniture around.  If any of your floors are uneven, fix them.  If there are any pets around you, be aware of where they are.  Review your medicines with your doctor. Some medicines can make you feel dizzy. This can increase your chance of falling. Ask your doctor what other things that you can do to help prevent falls. This information is not intended to replace advice given to you by your health care provider. Make sure you discuss any questions you have with your health care provider. Document Released: 07/12/2009 Document Revised: 02/21/2016 Document Reviewed: 10/20/2014 Elsevier Interactive Patient Education  2017 [employer].

## 2020-01-25 NOTE — Progress Notes (Signed)
Subjective:   [name] is a 86 y.o. female who presents for Medicare Annual (Subsequent) preventive examination.  This visit is being conducted via phone call  - after an attmept to do on video chat - due to the COVID-19 pandemic. This patient has given me verbal consent via phone to conduct this visit, patient states they are participating from their home address. Some vital signs may be absent or patient reported.   Visit conducted with patients daughter [name], patient is non verbal.   Patient identification: identified by name, DOB, and current address.    Review of Systems:   Cardiac Risk Factors include: advanced age (>34men, >27 women)     Objective:     Vitals: There were no vitals taken for this visit.  There is no height or weight on file to calculate BMI.  Advanced Directives 01/25/2020 11/08/2017 11/08/2017 10/13/2016 12/16/2015  Does Patient Have a Medical Advance Directive? Yes Yes Yes No Yes  Type of Advance Directive Living will;Healthcare Power of Attorney Living will;Healthcare Power of [employer] Power of [place];Out of facility DNR (pink MOST or yellow form) - Healthcare Power of Attorney  Does patient want to make changes to medical advance directive? - No - Patient declined - - No - Patient declined  Copy of Healthcare Power of Attorney in Chart? Yes - validated most recent copy scanned in chart (See row information) Yes No - copy requested - No - copy requested  Would patient like information on creating a medical advance directive? - - - No - Patient declined -    Tobacco Social History   Tobacco Use  Smoking Status Never Smoker  Smokeless Tobacco Never Used     Counseling given: Not Answered   Clinical Intake:  Pre-visit preparation completed: Yes  Pain : No/denies pain     Nutritional Risks: None  How often do you need to have someone help you when you read instructions, pamphlets, or other written materials from your  doctor or pharmacy?: 1 - Never  Interpreter Needed?: No  Information entered by :: Kynzli Rease,LPN  Past Medical History:  Diagnosis Date  . Anemia   . Arm fracture 03/2016   left proximal humerus  . Arthritis   . B12 deficiency   . Hypertension   . Mild dementia (HCC)   . Parkinson disease (HCC)   . Tremor    Past Surgical History:  Procedure Laterality Date  . ABDOMINAL HYSTERECTOMY    . JOINT REPLACEMENT     Family History  Family history unknown: Yes   Social History   Socioeconomic History  . Marital status: Married    Spouse name: Not on file  . Number of children: Not on file  . Years of education: Not on file  . Highest education level: Not on file  Occupational History  . Occupation: Retired  Tobacco Use  . Smoking status: Never Smoker  . Smokeless tobacco: Never Used  Substance and Sexual Activity  . Alcohol use: No    Alcohol/week: 0.0 standard drinks  . Drug use: No  . Sexual activity: Not on file  Other Topics Concern  . Not on file  Social History Narrative  . Not on file   Social Determinants of Health   Financial Resource Strain:   . Difficulty of Paying Living Expenses:   Food Insecurity:   . Worried About Programme researcher, broadcasting/film/video in the Last Year:   . [employer] of Food in the  Last Year:   Transportation Needs:   . Film/video editor (Medical):   Marland Kitchen Lack of Transportation (Non-Medical):   Physical Activity:   . Days of Exercise per Week:   . Minutes of Exercise per Session:   Stress:   . Feeling of Stress :   Social Connections:   . Frequency of Communication with Friends and Family:   . Frequency of Social Gatherings with Friends and Family:   . Attends Religious Services:   . Active Member of Clubs or Organizations:   . Attends Archivist Meetings:   Marland Kitchen Marital Status:     Outpatient Encounter Medications as of 01/25/2020  Medication Sig  . acetaminophen (TYLENOL) 325 MG tablet Take 650 mg by mouth 2 (two) times daily  as needed.   . Calcium Carbonate (CALCIUM-CARB 600 PO) Take 600 mg by mouth 2 (two) times daily.   . Carbidopa-Levodopa ER (SINEMET CR) 25-100 MG tablet controlled release Take 1 tablet by mouth 2 (two) times daily.  . Cholecalciferol (D3-1000 PO) Take 2 capsules by mouth daily.  Marland Kitchen docusate sodium (COLACE) 100 MG capsule Take 100 mg by mouth 2 (two) times daily.  . Multiple Vitamins-Minerals (MULTIVITAMIN ADULT PO) Take 1 tablet by mouth daily.   Marland Kitchen nystatin (MYCOSTATIN/NYSTOP) powder APPLY TO AFFECTED AREA 4 TIMES A DAY  . senna (SENOKOT) 8.6 MG TABS tablet Take 1 tablet by mouth daily as needed for mild constipation.  . fluconazole (DIFLUCAN) 150 MG tablet Take 1 tablet (150 mg total) by mouth every 3 (three) days. For three doses. (Patient not taking: Reported on 01/25/2020)  . ketoconazole (NIZORAL) 2 % cream Apply topically 2 (two) times daily. (Patient not taking: Reported on 01/25/2020)  . [DISCONTINUED] metoprolol succinate (TOPROL-XL) 50 MG 24 hr tablet TAKE 1 TABLET BY MOUTH EVERY DAY (Patient not taking: Patient takes half a tablet (25mg ) if heart rate is above 60. PRB)   No facility-administered encounter medications on file as of 01/25/2020.    Activities of Daily Living In your present state of health, do you have any difficulty performing the following activities: 01/25/2020  Hearing? N  Comment no hearing aids, nonverbal.  Vision? N  Comment unknown, seems to know her surroundings  Difficulty concentrating or making decisions? Y  Walking or climbing stairs? Y  Comment uses ramp  Dressing or bathing? Y  Doing errands, shopping? Y  Comment limit transportaion due to bedridden. uses [place] transportation when needed  Conservation officer, nature and eating ? Y  Using the Toilet? Y  In the past six months, have you accidently leaked urine? Y  Do you have problems with loss of bowel control? Y  Managing your Medications? Y  Managing your Finances? Y  Housekeeping or managing your  Housekeeping? Y  Some recent data might be hidden    Patient Care Team: Venita Lick, NP as PCP - General (Nurse Practitioner) Anabel Bene, MD as Referring Physician (Neurology) De Hollingshead, [facility] as Pharmacist (Pharmacist)    Assessment:   This is a routine wellness examination for Rubbie.  Exercise Activities and Dietary recommendations Current Exercise Habits: The patient does not participate in regular exercise at present, Exercise limited by: orthopedic condition(s)  Goals Addressed   None     Fall Risk: Fall Risk  01/25/2020 08/21/2017 11/04/2016 07/16/2016  Falls in the past year? 0 Exclusion - non ambulatory Exclusion - non ambulatory Yes  Number falls in past yr: 0 - - 1  Injury with Fall? 0 - -  Yes    FALL RISK PREVENTION PERTAINING TO THE HOME:  Any stairs in or around the home? No  ramp  If so, are there any without handrails? No   Home free of loose throw rugs in walkways, pet beds, electrical cords, etc? Yes  Adequate lighting in your home to reduce risk of falls? Yes   ASSISTIVE DEVICES UTILIZED TO PREVENT FALLS:    DME ORDERS:  DME order needed?  No   TIMED UP AND GO:  Unable to perform   Depression Screen PHQ 2/9 Scores 08/21/2017 07/16/2016  Exception Documentation Medical reason Other- indicate reason in comment box  Not completed - Patient has dementia     Cognitive Function        Immunization History  Administered Date(s) Administered  . Fluad Quad(high Dose 65+) 07/26/2019  . Influenza Inj Mdck Quad Pf 07/13/2018  . Influenza, High Dose Seasonal PF 06/04/2016  . Influenza,inj,Quad PF,6+ Mos 07/04/2015  . Influenza-Unspecified 07/13/2018  . PFIZER SARS-COV-2 Vaccination 10/10/2019, 10/31/2019  . Pneumococcal Conjugate-13 06/06/2014  . Pneumococcal-Unspecified 05/30/1998, 07/30/2007  . Td 10/06/2006  . Zoster 11/03/2007    Qualifies for Shingles Vaccine? Yes  Zostavax completed 2009. Due for Shingrix.  Education has been provided regarding the importance of this vaccine. Pt has been advised to call insurance company to determine out of pocket expense. Advised may also receive vaccine at local pharmacy or Health Dept. Verbalized acceptance and understanding.  Tdap: due now   Flu Vaccine: up to date   Pneumococcal Vaccine: up to date   Covid-19 Vaccine: Completed vaccines  Screening Tests Health Maintenance  Topic Date Due  . TETANUS/TDAP  01/24/2021 (Originally 10/06/2016)  . INFLUENZA VACCINE  04/29/2020  . DEXA SCAN  Completed  . COVID-19 Vaccine  Completed  . PNA vac Low Risk Adult  Completed    Cancer Screenings:  Colorectal Screening: no longer required   Mammogram: no longer required   Bone Density: no longer required   Lung Cancer Screening: (Low Dose CT Chest recommended if Age 36-80 years, 30 pack-year currently smoking OR have quit w/in 15years.) does not qualify.     Additional Screening:  Hepatitis C Screening: does not qualify  Vision Screening: Recommended annual ophthalmology exams for early detection of glaucoma and other disorders of the eye. Is the patient up to date with their annual eye exam?  no  Dental Screening: Recommended annual dental exams for proper oral hygiene  Community Resource Referral:  CRR required this visit?  No       Plan:  I have personally reviewed and addressed the Medicare Annual Wellness questionnaire and have noted the following in the patient's chart:  A. Medical and social history B. Use of alcohol, tobacco or illicit drugs  C. Current medications and supplements D. Functional ability and status E.  Nutritional status F.  Physical activity G. Advance directives H. List of other physicians I.  Hospitalizations, surgeries, and ER visits in previous 12 months J.  Vitals K. Screenings such as hearing and vision if needed, cognitive and depression L. Referrals and appointments   In addition, I have reviewed and  discussed with patient certain preventive protocols, quality metrics, and best practice recommendations. A written personalized care plan for preventive services as well as general preventive health recommendations were provided to patient.  Signed,    Collene Schlichter, LPN  8/41/6606 Nurse Health Advisor   Nurse Notes: none

## 2020-02-02 ENCOUNTER — Other Ambulatory Visit: Payer: Self-pay

## 2020-02-02 ENCOUNTER — Other Ambulatory Visit: Payer: Medicare Other | Admitting: Nurse Practitioner

## 2020-02-09 ENCOUNTER — Encounter: Payer: Self-pay | Admitting: Nurse Practitioner

## 2020-02-09 ENCOUNTER — Other Ambulatory Visit: Payer: Medicare Other | Admitting: Nurse Practitioner

## 2020-02-09 ENCOUNTER — Other Ambulatory Visit: Payer: Self-pay

## 2020-02-09 DIAGNOSIS — G20 Parkinson's disease: Secondary | ICD-10-CM

## 2020-02-09 DIAGNOSIS — Z515 Encounter for palliative care: Secondary | ICD-10-CM

## 2020-02-09 NOTE — Progress Notes (Signed)
Therapist, nutritional Palliative Care Consult Note Telephone: [phone]  Fax: [phone]  PATIENT NAME: [name] DOB: [birth year] MRN: [id number]  PRIMARY CARE PROVIDER:   Marjie Skiff, NP  REFERRING PROVIDER:  Responsible:[name] Joycelyn Rua [phone] Daughter  Due to the COVID-19 crisis, this visit was done via telemedicine from my office and it was initiated and consent by this patient and or family.  RECOMMENDATIONS and PLAN: 1.ACP: DNR: MOST on file with Epic/Vynca; Comfort care focus  Discussed hospice services, okay to review case with Hospice Physicians. Dr Dan Humphreys [facility] Physician) agree with eligibility. I called [name] and updated. [name] endorses will discuss with family, one concern is insurance under her husband's policy should she stabilize and d/c rather than passing would loose inability to continue policy.  2.Debility secondary toParkinson disease  3.Palliative care encounter ; Palliative medicine team will continue to support patient, patient's family, and medical team. Visit consisted of counseling and education dealing with the complex and emotionally intense issues of symptom management and palliative care in the setting of serious and potentially life-threatening illness  I spent 50 minutes providing this consultation,  From 9:30am to 10:20pm. More than 50% of the time in this consultation was spent coordinating communication.   HISTORY OF PRESENT ILLNESS:  [name] is a 86 y.o. year old female with multiple medical problems including Parkinson's disease, dysphasia, tremor, mild dementia, hypertension, anemia, arthritis, B12 deficiency, left proximal humerus fracture 03/2016, abdominal hysterectomy, joint replacement, protein calorie malnutrition, B12 deficiency. Telemedicine visit buy video with this [name]  daughters Luster Landsberg and Damian Leavell. We talked about purpose of palliative care visit. We talked about how [name] has been  doing. [name] endorses been experiencing some challenges with the caregivers that have been rotating through. We talked about appetite. [name] endorses [name] appetite has been good. We talked about constipation as she seems to be back to her normal bowel regimen routine. We talked about fungal rash in [name] folds which seems to have resolved. [name] endorses they do need a refill on Nystatin powder will call in to [facility] with 12 refills. [name] was able to show through the camera what it looks like dime size spots that blanche, with erythema. We talked about treatment options ensuring that it is exposed to air as much as possible. We talked about nutrition as they tried to continue supplements. We talked about concern for decline.. We talked about medical goals of care. Last palliative care visit discussed Hospice Physicians found eligible for Hospice services through Medicare benefit, [name] losing her insurance through her deceased husband if they elect to go Medicare hospice benefit. We talked about [facility] palliative care. Will continue to have more discussion among family. We talked about follow-up palliative care visit in six weeks if needed or sooner should she declined. [name] in agreement. Appointment scheduled. Therapeutic listening and emotional support provided. Contact information. Questions answered to satisfaction. Palliative Care was asked to help to continue to address goals of care.   CODE STATUS: DNR  PPS: 30% HOSPICE ELIGIBILITY/DIAGNOSIS: TBD  PAST MEDICAL HISTORY:  Past Medical History:  Diagnosis Date   Anemia    Arm fracture 03/2016   left proximal humerus   Arthritis    B12 deficiency    Hypertension    Mild dementia (HCC)    Parkinson disease (HCC)    Tremor     SOCIAL HX:  Social History   Tobacco Use   Smoking status: Never  Smoker   Smokeless tobacco: Never Used  Substance Use Topics   Alcohol use: No    Alcohol/week: 0.0 standard drinks     ALLERGIES:  Allergies  Allergen Reactions   Phenergan [Promethazine Hcl] Other (See Comments)    Family states the patient almost went into a coma.   Sulfa Antibiotics Other (See Comments)    Unknown reaction   Sulfasalazine Other (See Comments)    Unknown reaction     PERTINENT MEDICATIONS:  Outpatient Encounter Medications as of 02/09/2020  Medication Sig   acetaminophen (TYLENOL) 325 MG tablet Take 650 mg by mouth 2 (two) times daily as needed.    Calcium Carbonate (CALCIUM-CARB 600 PO) Take 600 mg by mouth 2 (two) times daily.    Carbidopa-Levodopa ER (SINEMET CR) 25-100 MG tablet controlled release Take 1 tablet by mouth 2 (two) times daily.   Cholecalciferol (D3-1000 PO) Take 2 capsules by mouth daily.   docusate sodium (COLACE) 100 MG capsule Take 100 mg by mouth 2 (two) times daily.   fluconazole (DIFLUCAN) 150 MG tablet Take 1 tablet (150 mg total) by mouth every 3 (three) days. For three doses. (Patient not taking: Reported on 01/25/2020)   ketoconazole (NIZORAL) 2 % cream Apply topically 2 (two) times daily. (Patient not taking: Reported on 01/25/2020)   Multiple Vitamins-Minerals (MULTIVITAMIN ADULT PO) Take 1 tablet by mouth daily.    nystatin (MYCOSTATIN/NYSTOP) powder APPLY TO AFFECTED AREA 4 TIMES A DAY   senna (SENOKOT) 8.6 MG TABS tablet Take 1 tablet by mouth daily as needed for mild constipation.   No facility-administered encounter medications on file as of 02/09/2020.    PHYSICAL EXAM:  Deferred Charolett Yarrow Z Terese Heier, NP

## 2020-02-20 ENCOUNTER — Other Ambulatory Visit: Payer: Medicare Other

## 2020-02-20 ENCOUNTER — Encounter: Payer: Self-pay | Admitting: Nurse Practitioner

## 2020-02-20 ENCOUNTER — Telehealth (INDEPENDENT_AMBULATORY_CARE_PROVIDER_SITE_OTHER): Payer: Medicare Other | Admitting: Nurse Practitioner

## 2020-02-20 ENCOUNTER — Other Ambulatory Visit: Payer: Self-pay

## 2020-02-20 ENCOUNTER — Telehealth: Payer: Self-pay

## 2020-02-20 VITALS — HR 61 | Temp 97.1°F

## 2020-02-20 DIAGNOSIS — R300 Dysuria: Secondary | ICD-10-CM

## 2020-02-20 DIAGNOSIS — R399 Unspecified symptoms and signs involving the genitourinary system: Secondary | ICD-10-CM

## 2020-02-20 LAB — UA/M W/RFLX CULTURE, ROUTINE
Bilirubin, UA: NEGATIVE
Glucose, UA: NEGATIVE
Ketones, UA: NEGATIVE
Leukocytes,UA: NEGATIVE
Nitrite, UA: NEGATIVE
Protein,UA: NEGATIVE
RBC, UA: NEGATIVE
Specific Gravity, UA: 1.02 (ref 1.005–1.030)
Urobilinogen, Ur: 0.2 mg/dL (ref 0.2–1.0)
pH, UA: 6.5 (ref 5.0–7.5)

## 2020-02-20 NOTE — Telephone Encounter (Signed)
Copied from CRM [id number]. Topic: General - Other >> Feb 20, 2020  8:10 AM Worthy Keeler D wrote: Reason for CRM: Patients Daughter would like to drop off a urine sample between 10-1030am bc of a suspected uti

## 2020-02-20 NOTE — Progress Notes (Signed)
Pulse 61   Temp (!) 97.1 F (36.2 C) (Axillary)   SpO2 94%    Subjective:    Patient ID: [name], female    DOB: 05-23-1923, 86 y.o.   MRN: [id number]  HPI: [name] is a 87 y.o. female presenting with UTI symptoms.  Patient presenting virtually with daughters who are caregivers.  Patient is non-verbal and is unable to provide history, history is provided by daughter.  Chief Complaint  Patient presents with  . UTI Symptoms    Appetite change, more sleepy. Daughters did the pharmacy dip stick and it showed leukocytes.    URINARY SYMPTOMS Daughter reports that last week, the patient's demeanor changed and she began getting more sleepy and eating less.  According to the daughter, these are the usual symptoms that the patient has when she has a UTI.  As stated above, the patient is unable to provide any history due to dementia. Onset: end of last week Dysuria: unable to assess Urinary frequency: unable to tell Urgency: unable to tell Small volume voids: unable to tell Urinary incontinence: yes - this is baseline for the patient Foul odor: no Hematuria: no Abdominal pain: patient unable to communicate Back pain: patient unable to communicate Suprapubic pain/pressure: patient unable to communicate Flank pain: patient unable to communicate Fever:  no Vomiting: no Relief with cranberry juice: no Relief with pyridium: not tried Status: stable Previous urinary tract infection: yes Recurrent urinary tract infection: no Sexual activity: Not sexually active History of sexually transmitted disease: no Treatments attempted: cranberry tablets; takes daily   Allergies  Allergen Reactions  . Phenergan [Promethazine Hcl] Other (See Comments)    Family states the patient almost went into a coma.  . Sulfa Antibiotics Other (See Comments)    Unknown reaction  . Sulfasalazine Other (See Comments)    Unknown reaction   Outpatient Encounter Medications as of 02/20/2020    Medication Sig Note  . acetaminophen (TYLENOL) 325 MG tablet Take 650 mg by mouth 2 (two) times daily as needed.  01/25/2020: 325mg  2 tabs in morning and 2 tablet in evening.   . Calcium Carbonate (CALCIUM-CARB 600 PO) Take 600 mg by mouth 2 (two) times daily.    . Carbidopa-Levodopa ER (SINEMET CR) 25-100 MG tablet controlled release Take 1 tablet by mouth 2 (two) times daily.   docusate sodium (COLACE) 100 MG capsule Take 100 mg by mouth 2 (two) times daily. 08/21/2017: PRN  . Multiple Vitamins-Minerals (MULTIVITAMIN ADULT PO) Take 1 tablet by mouth daily.    08/23/2017 nystatin (MYCOSTATIN/NYSTOP) powder APPLY TO AFFECTED AREA 4 TIMES A DAY   . senna (SENOKOT) 8.6 MG TABS tablet Take 1 tablet by mouth daily as needed for mild constipation.   . [DISCONTINUED] Cholecalciferol (D3-1000 PO) Take 2 capsules by mouth daily.   . [DISCONTINUED] fluconazole (DIFLUCAN) 150 MG tablet Take 1 tablet (150 mg total) by mouth every 3 (three) days. For three doses. (Patient not taking: Reported on 01/25/2020)   . [DISCONTINUED] ketoconazole (NIZORAL) 2 % cream Apply topically 2 (two) times daily. (Patient not taking: Reported on 01/25/2020)    No facility-administered encounter medications on file as of 02/20/2020.   Patient Active Problem List   Diagnosis Date Noted  . UTI symptoms 02/20/2020  . Tinea corporis 03/31/2019  . Debility 03/07/2019  . Memory deficits 03/07/2019  . Palliative care encounter 03/07/2019  . UTI (urinary tract infection) 02/07/2019  . Constipation 03/10/2018  . Protein-calorie malnutrition, severe 11/10/2017  .  Pressure injury of skin 11/09/2017  . [place] 12/18/2016  . Cloudy urine 07/16/2016  . Perimenopausal atrophic vaginitis 03/03/2016  . Parkinson's disease ([place]) 12/16/2015  . Dysphagia 12/16/2015  . B12 deficiency 10/29/2015  . Traumatic closed displaced fracture of multiple ribs 10/29/2015  . Hypertension    Past Medical History:  Diagnosis Date  . Anemia   . Arm  fracture 03/2016   left proximal humerus  . Arthritis   . B12 deficiency   . Hypertension   . Mild dementia ([place])   . Parkinson disease ([place])   . Tremor    Relevant past medical, surgical, family and social history reviewed and updated as indicated. Interim medical history since our last visit reviewed.  Review of Systems  Constitutional: Positive for appetite change. Negative for fever.  Gastrointestinal: Negative.  Negative for abdominal distention, diarrhea and vomiting.  Genitourinary: Negative for difficulty urinating and hematuria.  Skin: Negative.  Negative for color change.  Psychiatric/Behavioral: Positive for sleep disturbance.   Per HPI unless specifically indicated above     Objective:    Pulse 61   Temp (!) 97.1 F (36.2 C) (Axillary)   SpO2 94%   Wt Readings from Last 3 Encounters:  03/31/19 95 lb (43.1 kg)  11/08/17 85 lb (38.6 kg)  10/13/16 96 lb 6.4 oz (43.7 kg)    Physical Exam Vitals and nursing note reviewed.  Constitutional:      General: She is sleeping. She is not in acute distress.    Appearance: She is cachectic. She is not toxic-appearing.  Pulmonary:     Effort: No respiratory distress.  Neurological:     Mental Status: She is lethargic.     Cranial Nerves: No facial asymmetry.     Comments: Patient asleep during examination, does not speak  Psychiatric:        Behavior: Behavior is slowed.        Cognition and Memory: Memory is impaired.     Comments: Patient asleep during examination    Results for orders placed or performed in visit on 02/20/20  UA/M w/rflx Culture, Routine   Specimen: Urine   URINE  Result Value Ref Range   Specific Gravity, UA 1.020 1.005 - 1.030   pH, UA 6.5 5.0 - 7.5   Color, UA Yellow Yellow   Appearance Ur Hazy (A) Clear   Leukocytes,UA Negative Negative   Protein,UA Negative Negative/Trace   Glucose, UA Negative Negative   Ketones, UA Negative Negative   RBC, UA Negative Negative   Bilirubin, UA  Negative Negative   Urobilinogen, Ur 0.2 0.2 - 1.0 mg/dL   Nitrite, UA Negative Negative      Assessment & Plan:   Problem List Items Addressed This Visit      Other   UTI symptoms - Primary    UA checked in office - was negative except slightly cloudy.  Will send for culture per daughter's request and treat with antibiotics if indicated.  Daughter to contact office if patient's symptoms persist or worsen.      Relevant Orders   Urine Culture       Follow up plan: No follow-ups on file.  Due to the catastrophic nature of the COVID-19 pandemic, this visit was completed via audio and visual contact via Mychart due to the restrictions of the COVID-19 pandemic. All issues as above were discussed and addressed. Physical exam was done as above through visual confirmation on Mychart. If it was felt that the patient should  be evaluated in the office, they were directed there. The patient verbally consented to this visit."} . Location of the patient: home . Location of the provider: work . Those involved with this call:  . Provider: Mardene Celeste, DNP . CMA: Myrtha Mantis, CMA . Front Desk/Registration: Beverely Pace  . Time spent on call: 15 minutes on the phone discussing health concerns. 20 minutes total spent in review of patient's record and preparation of their chart.  I verified patient identity using two factors (patient name and date of birth). Patient consents verbally to being seen via telemedicine visit today.

## 2020-02-20 NOTE — Assessment & Plan Note (Signed)
UA checked in office - was negative except slightly cloudy.  Will send for culture per daughter's request and treat with antibiotics if indicated.  Daughter to contact office if patient's symptoms persist or worsen.

## 2020-02-20 NOTE — Patient Instructions (Signed)
Protein Content in Foods  Generally, most healthy people need around 50 grams of protein each day. Depending on your overall health, you may need more or less protein in your diet. Talk to your health care provider or dietitian about how much protein you need. See the following list for the protein content of some common foods. High-protein foods High-protein foods contain 4 grams (4 g) or more of protein per serving. They include:  Beef, ground sirloin (cooked) -- 3 oz have 24 g of protein.  Cheese (hard) -- 1 oz has 7 g of protein.  Chicken breast, boneless and skinless (cooked) -- 3 oz have 13.4 g of protein.  Cottage cheese -- 1/2 cup has 13.4 g of protein.  Egg -- 1 egg has 6 g of protein.  Fish, filet (cooked) -- 1 oz has 6-7 g of protein.  Garbanzo beans (canned or cooked) -- 1/2 cup has 6-7 g of protein.  Kidney beans (canned or cooked) -- 1/2 cup has 6-7 g of protein.  Lamb (cooked) -- 3 oz has 24 g of protein.  Milk -- 1 cup (8 oz) has 8 g of protein.  Nuts (peanuts, pistachios, almonds) -- 1 oz has 6 g of protein.  Peanut butter -- 1 oz has 7-8 g of protein.  Pork tenderloin (cooked) -- 3 oz has 18.4 g of protein.  Pumpkin seeds -- 1 oz has 8.5 g of protein.  Soybeans (roasted) -- 1 oz has 8 g of protein.  Soybeans (cooked) -- 1/2 cup has 11 g of protein.  Soy milk -- 1 cup (8 oz) has 5-10 g of protein.  Soy or vegetable patty -- 1 patty has 11 g of protein.  Sunflower seeds -- 1 oz has 5.5 g of protein.  Tofu (firm) -- 1/2 cup has 20 g of protein.  Tuna (canned in water) -- 3 oz has 20 g of protein.  Yogurt -- 6 oz has 8 g of protein. Low-protein foods Low-protein foods contain 3 grams (3 g) or less of protein per serving. They include:  Beets (raw or cooked) -- 1/2 cup has 1.5 g of protein.  Bran cereal -- 1/2 cup has 2-3 g of protein.  Bread -- 1 slice has 2.5 g of protein.  Broccoli (raw or cooked) -- 1/2 cup has 2 g of protein.  Collard  greens (raw or cooked) -- 1/2 cup has 2 g of protein.  Corn (fresh or cooked) -- 1/2 cup has 2 g of protein.  Cream cheese -- 1 oz has 2 g of protein.  Creamer (half-and-half) -- 1 oz has 1 g of protein.  Flour tortilla -- 1 tortilla has 2.5 g of protein  Frozen yogurt -- 1/2 cup has 3 g of protein.  Fruit or vegetable juice -- 1/2 cup has 1 g of protein.  Green beans (raw or cooked) -- 1/2 cup has 1 g of protein.  Green peas (canned) -- 1/2 cup has 3.5 g of protein.  Muffins -- 1 small muffin (2 oz) has 3 g of protein.  Oatmeal (cooked) -- 1/2 cup has 3 g of protein.  Potato (baked with skin) -- 1 medium potato has 3 g of protein.  Rice (cooked) -- 1/2 cup has 2.5-3.5 g of protein.  Sour cream -- 1/2 cup has 2.5 g of protein.  Spinach (cooked) -- 1/2 cup has 3 g of protein.  Squash (cooked) -- 1/2 cup has 1.5 g of protein. Actual amounts of protein may   be different depending on processing. Talk with your health care provider or dietitian about what foods are recommended for you. This information is not intended to replace advice given to you by your health care provider. Make sure you discuss any questions you have with your health care provider. Document Revised: 05/26/2016 Document Reviewed: 05/26/2016 Elsevier Patient Education  2020 Elsevier Inc.  

## 2020-02-22 NOTE — Progress Notes (Signed)
Please call patient's daughter, Luster Landsberg, with urine culture results.  Davisha's urine culture so far has not grown an abnormal amount of bacteria that requires treatment at this point.  We will continue to follow the culture and if there is a significant amount of bacteria, we will reach out with the treatment options.

## 2020-02-23 LAB — URINE CULTURE

## 2020-03-22 ENCOUNTER — Other Ambulatory Visit: Payer: Self-pay

## 2020-03-22 ENCOUNTER — Other Ambulatory Visit: Payer: Medicare Other | Admitting: Nurse Practitioner

## 2020-03-22 ENCOUNTER — Encounter: Payer: Self-pay | Admitting: Nurse Practitioner

## 2020-03-22 DIAGNOSIS — Z515 Encounter for palliative care: Secondary | ICD-10-CM

## 2020-03-22 DIAGNOSIS — G20 Parkinson's disease: Secondary | ICD-10-CM

## 2020-03-22 NOTE — Progress Notes (Signed)
[facility] Consult Note Telephone: [phone]  Fax: [phone]  PATIENT NAME: [name] DOB: [birth year] MRN: [id number]  PRIMARY CARE PROVIDER:   Venita Lick, [name]  REFERRING PROVIDER:  Venita Lick, [name] [address] [place],  [place] [address] [place] [phone] Daughter  Due to the COVID-19 crisis, this visit was done via telemedicine from my office and it was initiated and consent by this patient and or family.  RECOMMENDATIONS and PLAN: 1.ACP: DNR: MOST on file with Epic/Vynca; Comfort care focus  2.Debility secondary toParkinson disease  3.Palliative care encounter ; Palliative medicine team will continue to support patient, patient's family, and medical team. Visit consisted of counseling and education dealing with the complex and emotionally intense issues of symptom management and palliative care in the setting of serious and potentially life-threatening illness  I spent 60 minutes providing this consultation,  from 10:00am to 11:00am. More than 50% of the time in this consultation was spent coordinating communication.   HISTORY OF PRESENT ILLNESS:  [name] is a 85 y.o. year old female with multiple medical problems including Parkinson's disease, dysphasia, tremor, mild dementia, hypertension, anemia, arthritis, B12 deficiency, left proximal humerus fracture 03/2016, abdominal hysterectomy, joint replacement, protein calorie malnutrition, B12 deficiency. Telemedicine how about palliative care visit by video per family request for [name]. We talked about palliative care visit. [name] daughter in agreement and daughter [name] present also. We talked about how [name] has been doing. She does continue take have some bread and areas appears fungal from video in folds. [name] endorses she did give one to two Diflucan which helped clear a little. We talked about trying Flagyl course or possibly  going back to Ketoconazole with hydrocortisone mix. [name] endorses wishes to try Ketoconazole mixed with hydrocortisone over-the-counter mix. Will send in prescription for Ketoconazole all with three refills 2% cream. We talked about appetite which fairies depending on the meal and if she's been constipated or stools have been more lose. We talked about symptoms of UTI in May as [name] endorses [name] did get the Cipro when they dipstick the urine it appeared to have nitrates so they went ahead and started the Cipro notify [facility] as it was after hours when this occurred. We talked about keeping an antibiotic on hand in case a UTI returns and office hours are closed being severely debilitated and it is very difficult to get her to an urgent care and wishes already keep her at home with minimal ER visits. Discuss will follow up with [name] at [facility] to be sure all are on the same goal about having a antibiotic prescription on hand in case I have a UTI with office hours closed. We talked about medical goals of care. We talked about roll a palliative care. We did briefly talked about primary providers that do provide in-home care rather than attempting to get to the [facility] office though [facility] has been exceptionally accommodating with telemedicine is needed due to the difficulty with mobility. [name] endorses she and her sisters will talk about the option I would prefer to stay with [facility] as [name] started out with Dr Jeananne Rama father as her primary care provider and well-known to the family. We talked about follow up palliative care visit in 2 months if needed or sooner should she declined. [name] in agreement, Appointment scheduled. Discuss that will notify [name] wants further  discuss with [name] about antibiotic on hand in case of an urgent need. I message [name] nurse practitioner, discussed concern an option  of having a antibiotic on hand. [name] in agreement. Ketoconazole 2% cream BID; 3 RF; Keflex 250 bid x 7 Days sent in with 1 refill. I did call [name] and notify that [name] in agreement with plan of care and Ketoconazol with Keflex prescription send into Pharmacy by ascribe.   Palliative Care was asked to help to continue to address goals of care.   CODE STATUS: DNR  PPS: 30% HOSPICE ELIGIBILITY/DIAGNOSIS: TBD  PAST MEDICAL HISTORY:  Past Medical History:  Diagnosis Date  . Anemia   . Arm fracture 03/2016   left proximal humerus  . Arthritis   . B12 deficiency   . Hypertension   . Mild dementia (HCC)   . Parkinson disease (HCC)   . Tremor     SOCIAL HX:  Social History   Tobacco Use  . Smoking status: Never Smoker  . Smokeless tobacco: Never Used  Substance Use Topics  . Alcohol use: No    Alcohol/week: 0.0 standard drinks    ALLERGIES:  Allergies  Allergen Reactions  . Phenergan [Promethazine Hcl] Other (See Comments)    Family states the patient almost went into a coma.  . Sulfa Antibiotics Other (See Comments)    Unknown reaction  . Sulfasalazine Other (See Comments)    Unknown reaction     PERTINENT MEDICATIONS:  Outpatient Encounter Medications as of 03/22/2020  Medication Sig  . acetaminophen (TYLENOL) 325 MG tablet Take 650 mg by mouth 2 (two) times daily as needed.   . Calcium Carbonate (CALCIUM-CARB 600 PO) Take 600 mg by mouth 2 (two) times daily.   . Carbidopa-Levodopa ER (SINEMET CR) 25-100 MG tablet controlled release Take 1 tablet by mouth 2 (two) times daily.  Marland Kitchen docusate sodium (COLACE) 100 MG capsule Take 100 mg by mouth 2 (two) times daily.  . Multiple Vitamins-Minerals (MULTIVITAMIN ADULT PO) Take 1 tablet by mouth daily.   Marland Kitchen nystatin (MYCOSTATIN/NYSTOP) powder APPLY TO AFFECTED AREA 4 TIMES A DAY  . senna (SENOKOT) 8.6 MG TABS tablet Take 1 tablet by mouth daily as needed for mild constipation.   No facility-administered encounter  medications on file as of 03/22/2020.    PHYSICAL EXAM:   Deferred [name], [name]

## 2020-04-23 ENCOUNTER — Telehealth: Payer: Self-pay | Admitting: Nurse Practitioner

## 2020-04-23 NOTE — Telephone Encounter (Signed)
[name], Ms. Bonafede daughter Healthcare private turning called concerning rash. [name] endorses rash did not resolve with the Diflucan 150 Q 72 hours x 3 doses. We previously talked about option of Flagyl if the Diflucan did not work. [name] requested the Flagyl be sent in. Will send in 500mg  x 5 days. We talked extensively about the rash and that likely fungal most important things are airing out, keeping area dry. Ms Clewis is bed-bound, contracted and it is very difficult to get her to primary providers office though if not improving will recommend in-person visit with primary practice with possible referral to Dermatology. Questions answered dissatisfaction. [name] in agreement and will continue schedule palliative care follow-up visit. Therapeutic listening and emotional support provided. Contact information.  Total time spent 20 minutes Documentation 5 minutes Phone discussion 15 minutes

## 2020-05-21 ENCOUNTER — Telehealth: Payer: Self-pay | Admitting: Nurse Practitioner

## 2020-05-21 NOTE — Telephone Encounter (Signed)
Scheduled apt for 05/22/2020 at 8:40 for a virtual apt. Pt's daughter verbalized understanding.

## 2020-05-21 NOTE — Telephone Encounter (Signed)
Could do virtual appointment and then we can work on forms they bring in.  Thanks.

## 2020-05-21 NOTE — Telephone Encounter (Signed)
Pt's daughter/ power of attorney would like 2 FL2's to be printed and signed and the [employer] form to be filled and signed so they can start appyling to palaces for the future due to most palaces having a waiting list.Unsure if pt needed apt stated some one would call Daughter at  [phone] to let her know if provider would like apt or if we can sign forms with out the apt. [employer] form left in bin to be reviewed and signed.

## 2020-05-21 NOTE — Telephone Encounter (Signed)
[name],   Is this something we can complete or does pt need appt?

## 2020-05-22 ENCOUNTER — Telehealth (INDEPENDENT_AMBULATORY_CARE_PROVIDER_SITE_OTHER): Payer: Medicare Other | Admitting: Nurse Practitioner

## 2020-05-22 ENCOUNTER — Encounter: Payer: Self-pay | Admitting: Nurse Practitioner

## 2020-05-22 DIAGNOSIS — L89891 Pressure ulcer of other site, stage 1: Secondary | ICD-10-CM | POA: Diagnosis not present

## 2020-05-22 DIAGNOSIS — Z0289 Encounter for other administrative examinations: Secondary | ICD-10-CM | POA: Diagnosis not present

## 2020-05-22 MED ORDER — MUPIROCIN 2 % EX OINT: 1.0000 "application " | TOPICAL_OINTMENT | Freq: Two times a day (BID) | CUTANEOUS | 3 refills | Status: AC

## 2020-05-22 NOTE — Assessment & Plan Note (Signed)
Skin tear to right lower leg with crusting and mild erythema.  Will send in Bactroban refill and recommend continue this, then gently cover with non stick or Tegaderm if needed.  Monitor for s/s infection and alert provider immediately if present.  Continue collaboration with palliative team.

## 2020-05-22 NOTE — Patient Instructions (Signed)
Abrasion  An abrasion is a cut or a scrape on the surface of your skin. An abrasion does not go through all the layers of your skin. It is important to take good care of your abrasion to prevent infection. Follow these instructions at home: Medicines  Take or apply over-the-counter and prescription medicines only as told by your doctor.  If you were prescribed an antibiotic medicine, apply it as told by your doctor. Do not stop using the antibiotic even if you start to feel better. Wound care  Clean the wound 2-3 times a day or as often as told by your doctor. To do this: 1. Wash the wound with mild soap and water. 2. Rinse off the soap. 3. Pat a clean towel on the wound to dry it. Do not rub it.  Keep the bandage (dressing) clean and dry as told by your doctor.  Follow instructions from your doctor about how to take care of your wound. Make sure you: ? Wash your hands with soap and water before you change your bandage. If you cannot use soap and water, use hand sanitizer. ? Change your bandage as told by your doctor.  Check your wound every day for signs of infection. Check for: ? Redness, swelling, or pain. ? Fluid or blood. ? Warmth. ? Pus or a bad smell.  If directed, put ice on the injured area. To do this: ? Put ice in a plastic bag. ? Place a towel between your skin and the bag. ? Leave the ice on for 20 minutes, 2-3 times a day. General instructions  Do not take baths, swim, or use a hot tub until your doctor says it is okay.  If there is swelling, raise (elevate) the injured area above the level of your heart while you are sitting or lying down.  Keep all follow-up visits as told by your doctor. This is important. Contact a doctor if:  You were given a tetanus shot, and you have any of these where the needle went in: ? Swelling. ? Very bad pain. ? Redness. ? Bleeding.  You have a lot of pain, and medicine does not help.  You have any of these at the site of  the wound: ? More redness. ? More swelling. ? More pain. Get help right away if:  You have a red streak going away from your wound.  You have a fever.  You have fluid, blood, or pus coming from your wound.  There is a bad smell coming from your wound or bandage. Summary  An abrasion is a cut or a scrape on the surface of your skin.  Take good care of your abrasion so it does not get infected.  Clean the wound with mild soap and water, and change your bandage as told by your doctor.  Call your doctor if you have redness, swelling, or more pain in your wound.  Get help right away if you have a fever or if you have fluid, blood, pus, a bad smell, or a red streak coming from the wound. This information is not intended to replace advice given to you by your health care provider. Make sure you discuss any questions you have with your health care provider. Document Revised: 08/28/2017 Document Reviewed: 05/07/2017 Elsevier Patient Education  2020 Elsevier Inc.  

## 2020-05-22 NOTE — Progress Notes (Signed)
There were no vitals taken for this visit.   Subjective:    Patient ID: [name], female    DOB: 1923-01-09, 86 y.o.   MRN: [id number]  HPI: [name] is a 86 y.o. female  Chief Complaint  Patient presents with  . Forms    needs FL2 forms filled out  . Wound Check    pt's daughter states that the patient has a skin tear on her leg that she does not feel is healing properly    . This visit was completed via MyChart due to the restrictions of the COVID-19 pandemic. All issues as above were discussed and addressed. Physical exam was done as above through visual confirmation on MyChart. If it was felt that the patient should be evaluated in the office, they were directed there. The patient verbally consented to this visit. . Location of the patient: home . Location of the provider: work . Those involved with this call:  . Provider: Aura Dials, DNP . CMA: Wilhemena Durie, CMA . Front Desk/Registration: Adela Ports  . Time spent on call: 20 minutes with patient face to face via video conference. More than 50% of this time was spent in counseling and coordination of care. 15 minutes total spent in review of patient's record and preparation of their chart.  . I verified patient identity using two factors (patient name and date of birth). Patient consents verbally to being seen via telemedicine visit today.    FORMS -- FL2: Need FL2 filled out for patient, they are looking towards future, possibly [facility]  Reviewed forms with them.  SKIN WOUND Followed by palliative NP with last visit 03/22/20.  She is bed bound patient with Parkinson's Disease and memory deficit.  Skin tear to right leg below knee, her daughter reports it was looking better, but then recently started looking more red.  Not warm or swollen.  Have been placing triple abx to area, now using Bactroban -- reports less red today. Duration: days Location: right leg below knee History of trauma in  area: no Redness: mild Swelling: no Oozing: no Pus: no Fevers: no Nausea/vomiting: no Status: stable Treatments attempted:none  Tetanus: Not UTD  Relevant past medical, surgical, family and social history reviewed and updated as indicated. Interim medical history since our last visit reviewed. Allergies and medications reviewed and updated.  Review of Systems  Constitutional: Negative for activity change, appetite change, diaphoresis, fatigue and fever.  Respiratory: Negative for cough, chest tightness and shortness of breath.   Cardiovascular: Negative for chest pain, palpitations and leg swelling.  Skin: Positive for wound.    Per HPI unless specifically indicated above     Objective:    There were no vitals taken for this visit.  Wt Readings from Last 3 Encounters:  03/31/19 95 lb (43.1 kg)  11/08/17 85 lb (38.6 kg)  10/13/16 96 lb 6.4 oz (43.7 kg)    Physical Exam Vitals and nursing note reviewed.  Constitutional:      General: She is awake. She is not in acute distress.    Appearance: She is well-developed. She is not ill-appearing.  HENT:     Head: Normocephalic.     Right Ear: Hearing normal.     Left Ear: Hearing normal.  Eyes:     General: Lids are normal.        Right eye: No discharge.        Left eye: No discharge.     Conjunctiva/sclera: Conjunctivae  normal.  Pulmonary:     Effort: Pulmonary effort is normal. No accessory muscle usage or respiratory distress.  Musculoskeletal:     Cervical back: Normal range of motion.  Skin:    Comments: Skin tear present to right leg below knee, visualized briefly.  Area with crusting to interior, mild erythema around exterior.  Her daughter reports no warmth or edema.  Neurological:     Mental Status: She is alert and oriented to person, place, and time.  Psychiatric:        Attention and Perception: Attention normal.        Mood and Affect: Mood normal.        Behavior: Behavior normal. Behavior is  cooperative.        Thought Content: Thought content normal.        Judgment: Judgment normal.     Results for orders placed or performed in visit on 02/20/20  Urine Culture   Specimen: Urine   UR  Result Value Ref Range   Urine Culture, Routine Final report    Organism ID, Bacteria Comment       Assessment & Plan:   Problem List Items Addressed This Visit      Musculoskeletal and Integument   Pressure injury of skin    Skin tear to right lower leg with crusting and mild erythema.  Will send in Bactroban refill and recommend continue this, then gently cover with non stick or Tegaderm if needed.  Monitor for s/s infection and alert provider immediately if present.  Continue collaboration with palliative team.       Other Visit Diagnoses    Encounter for completion of form with patient    -  Primary   FL2 forms to be completed       Follow up plan: Return if symptoms worsen or fail to improve.

## 2020-05-24 ENCOUNTER — Encounter: Payer: Self-pay | Admitting: Nurse Practitioner

## 2020-05-24 ENCOUNTER — Other Ambulatory Visit: Payer: Self-pay

## 2020-05-24 ENCOUNTER — Other Ambulatory Visit: Payer: Medicare Other | Admitting: Nurse Practitioner

## 2020-05-24 DIAGNOSIS — G20 Parkinson's disease: Secondary | ICD-10-CM

## 2020-05-24 DIAGNOSIS — G20A1 Parkinson's disease without dyskinesia, without mention of fluctuations: Secondary | ICD-10-CM

## 2020-05-24 DIAGNOSIS — Z515 Encounter for palliative care: Secondary | ICD-10-CM

## 2020-05-24 MED ORDER — DOXYCYCLINE HYCLATE 100 MG PO TABS
100.0000 mg | ORAL_TABLET | Freq: Two times a day (BID) | ORAL | 0 refills | Status: DC
Start: 2020-05-24 — End: 2020-08-29

## 2020-05-24 NOTE — Addendum Note (Signed)
Addended by: Aura Dials T on: 05/24/2020 11:57 AM   Modules accepted: Orders

## 2020-05-24 NOTE — Progress Notes (Signed)
Therapist, nutritional Palliative Care Consult Note Telephone: [phone]  Fax: [phone]  PATIENT NAME: [name] DOB: 07/01/1923 MRN: [id number]  PRIMARY CARE PROVIDER:   Marjie Skiff, NP  REFERRING PROVIDER:  Marjie Skiff, NP [address] [place],  [place] [address]  Responsible:[name] [phone] Daughter  Due to the COVID-19 crisis, this visit was done via telemedicine from my office and it was initiated and consent by this patient and or family.  RECOMMENDATIONS and PLAN: 1.ACP: DNR: MOST on file with Epic/Vynca; Comfort care focus  2.Debility secondary toParkinson disease  3.Palliative care encounter ; Palliative medicine team will continue to support patient, patient's family, and medical team. Visit consisted of counseling and education dealing with the complex and emotionally intense issues of symptom management and palliative care in the setting of serious and potentially life-threatening illness  4. F/u 2 months as [name] is severe debility functionally, cognitively, requires to be turned, with new wound, monitor appetite, bowel regimen, reoccurrent tinea   I spent 50 minutes providing this consultation,  from 10:00am to 10:50am. More than 50% of the time in this consultation was spent coordinating communication.   HISTORY OF PRESENT ILLNESS:  [name] is a 86 y.o. year old female with multiple medical problems including Parkinson's disease, dysphasia, tremor, mild dementia, hypertension, anemia, arthritis, B12 deficiency, left proximal humerus fracture 03/2016, abdominal hysterectomy, joint replacement, protein calorie malnutrition, B12 deficiency.Telemedicine visit done for follow-up palliative care with Tina Landsberg, [name] daughter by video. We talked about purpose of palliative care visit. Tina in agreement. We talked about how [name] has been doing. Tina endorses that she is doing okay, fair appetite. Still  working with her bowel regimen. Functionally [name] is total ADL dependence, incontinence bowel and bladder, muscle wasting with atrophy requires to be turned in positions. Tinea rashes have resolved with course of Flagyl. [name] does have a skin tear on her leg for which primary saw her recently and recommended wound care for which they have been doing. Tina endorsed is that it looks a little better but there was a question whether she needed an antibiotic. We talked at length about severe disability and the challenges involved in getting this job to an appointment. We talked about it coming up on the weekend and possibly best plan is to have an antibiotic on hand in case it becomes infected when it is difficult to access a provider. We talked about Home Health in wound care. Tina and agreements to having home health started to care for the wound. Will contact [facility] for order for home health nurse for wound care and recommend a antibiotic. We talked about stability. We talked about severe cognitive impairment. We talked about medication challenges as the CNAs are no longer able to administer the medication. Tina asked if there was a type of service that would be covered where someone could come in and administer her medication. We talked about not a service that insurance would provide family would have to administer medications. We talked about medication she is currently on and reviewed. We talked about the sinemet, [name] will further discuss with Neurology. [name] has reservations about stopping the sinemet due to the severe tremor that [name] experiences, concern for suffering. We talked about Hospice more questions answered. Tina endorses family continue to have family discussions and if decide Hospice would be a good fit they will let palliative know. We talked about role  palliative care and plan of care. We talked about follow-up visit in one month if needed or sooner should she  declined. Appointment scheduled. Therapeutic listening, emotional support provided. Contact information. Questions answered to satisfaction.  Palliative Care was asked to help to continue to address goals of care.   CODE STATUS: DNR  PPS: 30% HOSPICE ELIGIBILITY/DIAGNOSIS: TBD  PAST MEDICAL HISTORY:  Past Medical History:  Diagnosis Date  . Anemia   . Arm fracture 03/2016   left proximal humerus  . Arthritis   . B12 deficiency   . Hypertension   . Mild dementia (HCC)   . Parkinson disease (HCC)   . Tremor     SOCIAL HX:  Social History   Tobacco Use  . Smoking status: Never Smoker  . Smokeless tobacco: Never Used  Substance Use Topics  . Alcohol use: No    Alcohol/week: 0.0 standard drinks    ALLERGIES:  Allergies  Allergen Reactions  . Phenergan [Promethazine Hcl] Other (See Comments)    Family states the patient almost went into a coma.  . Sulfa Antibiotics Other (See Comments)    Unknown reaction  . Sulfasalazine Other (See Comments)    Unknown reaction     PERTINENT MEDICATIONS:  Outpatient Encounter Medications as of 05/24/2020  Medication Sig  . acetaminophen (TYLENOL) 325 MG tablet Take 650 mg by mouth 2 (two) times daily as needed.   . Calcium Carbonate (CALCIUM-CARB 600 PO) Take 600 mg by mouth 2 (two) times daily.   . Carbidopa-Levodopa ER (SINEMET CR) 25-100 MG tablet controlled release Take 1 tablet by mouth 2 (two) times daily.  Marland Kitchen docusate sodium (COLACE) 100 MG capsule Take 100 mg by mouth 2 (two) times daily.  Marland Kitchen doxycycline (VIBRA-TABS) 100 MG tablet Take 1 tablet (100 mg total) by mouth 2 (two) times daily.  Marland Kitchen ketoconazole (NIZORAL) 2 % cream SMARTSIG:Sparingly Topical Twice Daily  . Multiple Vitamins-Minerals (MULTIVITAMIN ADULT PO) Take 1 tablet by mouth daily.   . mupirocin ointment (BACTROBAN) 2 % Apply 1 application topically 2 (two) times daily. Apply to wound as instructed.  . nystatin (MYCOSTATIN/NYSTOP) powder APPLY TO AFFECTED AREA 4  TIMES A DAY  . senna (SENOKOT) 8.6 MG TABS tablet Take 1 tablet by mouth daily as needed for mild constipation.   No facility-administered encounter medications on file as of 05/24/2020.    PHYSICAL EXAM:   Deferred  Ellyson Rarick Z Dvora Buitron, NP

## 2020-05-28 ENCOUNTER — Telehealth: Payer: Self-pay | Admitting: Nurse Practitioner

## 2020-05-28 NOTE — Telephone Encounter (Signed)
Spoke with [employer]. The pt only has the [facility] and not Medicare A and B. Let Luster Landsberg know the issue with insurance. I will call around today to see if anyone will take this patients insurance again. If I am unable to get home health Luster Landsberg is okay with getting patient to wound care.

## 2020-05-28 NOTE — Telephone Encounter (Signed)
Called and spoke to [name]. Advised her of [name]'s message. [name] states that no one has called about a nurse coming out or anything. She states that they have used several companies in the past and does not understand what the issue is. [name] mentioned that the patient has Medicare A and B primary, and then [facility] Advantage secondary.  Message [name] to discuss with her. She states that she is having trouble finding somewhere to take the insurance. Appears in the patient's chart that we only have the [facility] on file and not the traditional Medicare. Informed [name] about the insurances and [name] states that traditional Medicare would be taken no problem. [name] is going to reach out to the patient's daughter to discuss. Routing to her for her phone documentation.

## 2020-05-28 NOTE — Telephone Encounter (Signed)
Copied from CRM [id number]. Topic: General - Other >> May 25, 2020 10:50 AM Dalphine Handing A wrote: Patients daughter Greig Right is requesting a callback today before 12pm due to patient having an appt to get to. Aram Beecham is requesting a callback in regards to the home health nurse coming to visit patient. They were under the impression that the nurse was coming today but having heard anything. Aram Beecham wants to know what date was scheduled for nurse to come to home for patient and also if the nurse isnt coming today, what [name] would advise as far as the treatment of the patients wound that is looking very red and irritated and also antibiotics. Please advise Best contact [phone]

## 2020-05-28 NOTE — Telephone Encounter (Signed)
Please give her a call.  Alert her Doxycycline was sent in last week and referral placed to home health.  I am not sure when nurse will be coming out as we do not have that information, but recommend they call home health company.  I sent referral to location they requested.  I would ensure to take Doxycycline for wound and then continue placing abx ointment and gentle wrap around area, will see if home health nurse recommends changes to regimen when area assessed.  Thank you.

## 2020-05-29 NOTE — Telephone Encounter (Signed)
Referral accepted by [facility]. [name] is aware.

## 2020-06-01 ENCOUNTER — Telehealth: Payer: Self-pay | Admitting: Nurse Practitioner

## 2020-06-01 NOTE — Telephone Encounter (Signed)
Pt's daughter brought FMLA papers to be signed for her for taking care of PT. Daughter is Concha Pyo can be reached at [phone]

## 2020-06-05 NOTE — Telephone Encounter (Signed)
Forms filled out and placed in providers folder for signature

## 2020-06-06 ENCOUNTER — Encounter: Payer: Self-pay | Admitting: Nurse Practitioner

## 2020-06-06 ENCOUNTER — Telehealth: Payer: Self-pay | Admitting: Nurse Practitioner

## 2020-06-06 NOTE — Telephone Encounter (Signed)
I am not sure what other form is needed.  I did print letter.

## 2020-06-06 NOTE — Telephone Encounter (Signed)
Copied from CRM [id number]. Topic: General - Inquiry >> Jun 01, 2020  4:40 PM Adrian Prince D wrote: Reason for CRM: Patient called and said that this paper was requested by the assisted living residence. The name of the paper they need is a history and physical form

## 2020-06-06 NOTE — Telephone Encounter (Signed)
Called and spoke to patient's daughter, [name]. She states that she is not sure what specific form they need and they did not have one for [place] to fill out. FL2 was turned in already so the facility has that but are still asking for this. [name], do you by any chance know what form they are talking about?  [name] mentioned that it is basically just about her moms overall health at last visit / physical. Maybe we can just do a general letter for them to have and take regarding the patient's health and well being.

## 2020-06-06 NOTE — Telephone Encounter (Signed)
Forms completed and copies made. One placed in scan, one for blue folder and original for patient pickup. Called and notified Angelique Blonder that they are ready to be picked up.

## 2020-06-07 NOTE — Telephone Encounter (Signed)
Called and let [name] know that the letter from [facility] was ready to be picked up.

## 2020-07-12 ENCOUNTER — Encounter: Payer: Self-pay | Admitting: Nurse Practitioner

## 2020-07-12 ENCOUNTER — Other Ambulatory Visit: Payer: Medicare Other | Admitting: Nurse Practitioner

## 2020-07-12 DIAGNOSIS — G20 Parkinson's disease: Secondary | ICD-10-CM

## 2020-07-12 DIAGNOSIS — Z515 Encounter for palliative care: Secondary | ICD-10-CM

## 2020-07-12 NOTE — Progress Notes (Signed)
Therapist, nutritional Palliative Care Consult Note Telephone: [phone]  Fax: [phone]  PATIENT NAME: [name] DOB: 11-21-1922 MRN: [id number]  PRIMARY CARE PROVIDER:   Marjie Skiff, NP  REFERRING PROVIDER:  Marjie Skiff, NP [address] [place],  [place] [address] Responsible:[name] [phone] Daughter  Due to the COVID-19 crisis, this visit was done via telemedicine from my office and it was initiated and consent by this patient and or family.  RECOMMENDATIONS and PLAN: 1.ACP: DNR: MOST on file with Epic/Vynca; Comfort care focus  2.Debility secondary toParkinson disease  Left Arm 8 inches Right leg 14 inches  3.Palliative care encounter ; Palliative medicine team will continue to support patient, patient's family, and medical team. Visit consisted of counseling and education dealing with the complex and emotionally intense issues of symptom management and palliative care in the setting of serious and potentially life-threatening illness  4. F/u 6 weeks as [name] is severe debility functionally, cognitively, requires to be turned, with new wound, monitor appetite, bowel regimen, reoccurrent tinea   I spent 60 minutes providing this consultation,  from 10:00am to 11:00am. More than 50% of the time in this consultation was spent coordinating communication.   HISTORY OF PRESENT ILLNESS:  [name] is a 86 y.o. year old female with multiple medical problems including Parkinson's disease, dysphasia, tremor, mild dementia, hypertension, anemia, arthritis, B12 deficiency, left proximal humerus fracture 03/2016, abdominal hysterectomy, joint replacement, protein calorie malnutrition, B12 deficiency.Telemedicine visit by video for follow-up palliative care her family's request. [name] [name] daughters were all on video to join visit. We talked about purpose of visit. We talked about how Ms [name] has been doing.  Tina endorses [name] has been a little better with her wounds as the leg wound has healed with the help of Home Health Nursing. Tina endorses the red area on her buttocks does have a small blister. We talked about treatment with a blister, skin prep and just continuing to support and helps the body will reabsorb. We talked about [name] skin being very fragile. We talked about areas surrounding erythema area which was visualized through camera. [name] folds that has significantly improved. We talked about turning and opening to are. We talked about her appetite some days it is better than others. We talked about Ms [name] sleeping more. We talked about wound healing overall when they start seeing wounds that are not being healed that has indicated she is continuing to decline. We talked about [name] body healing the wound on her leg. We talked about nutrition. We talked about bowel regiment. We reviewed medical goals of care. We talked about follow up visit in 6 weeks if needed or sooner should she declined. Tina in agreement, telemedicine visit scheduled for family request. Therapeutic listening and emotional support provided. Contact information. Questions answered to satisfaction.  Palliative Care was asked to help to continue to address goals of care.   CODE STATUS: DNR  PPS: 30% HOSPICE ELIGIBILITY/DIAGNOSIS: TBD  PAST MEDICAL HISTORY:  Past Medical History:  Diagnosis Date  . Anemia   . Arm fracture 03/2016   left proximal humerus  . Arthritis   . B12 deficiency   . Hypertension   . Mild dementia (HCC)   . Parkinson disease (HCC)   . Tremor     SOCIAL HX:  Social History   Tobacco Use  . Smoking status: Never Smoker  . Smokeless tobacco: Never  Used  Substance Use Topics  . Alcohol use: No    Alcohol/week: 0.0 standard drinks    ALLERGIES:  Allergies  Allergen Reactions  . Phenergan [Promethazine Hcl] Other (See Comments)    Family states the patient almost went into  a coma.  . Sulfa Antibiotics Other (See Comments)    Unknown reaction  . Sulfasalazine Other (See Comments)    Unknown reaction     PERTINENT MEDICATIONS:  Outpatient Encounter Medications as of 07/12/2020  Medication Sig  . acetaminophen (TYLENOL) 325 MG tablet Take 650 mg by mouth 2 (two) times daily as needed.   . Calcium Carbonate (CALCIUM-CARB 600 PO) Take 600 mg by mouth 2 (two) times daily.   . Carbidopa-Levodopa ER (SINEMET CR) 25-100 MG tablet controlled release Take 1 tablet by mouth 2 (two) times daily.  Marland Kitchen docusate sodium (COLACE) 100 MG capsule Take 100 mg by mouth 2 (two) times daily.  Marland Kitchen doxycycline (VIBRA-TABS) 100 MG tablet Take 1 tablet (100 mg total) by mouth 2 (two) times daily.  Marland Kitchen ketoconazole (NIZORAL) 2 % cream SMARTSIG:Sparingly Topical Twice Daily  . Multiple Vitamins-Minerals (MULTIVITAMIN ADULT PO) Take 1 tablet by mouth daily.   . mupirocin ointment (BACTROBAN) 2 % Apply 1 application topically 2 (two) times daily. Apply to wound as instructed.  . nystatin (MYCOSTATIN/NYSTOP) powder APPLY TO AFFECTED AREA 4 TIMES A DAY  . senna (SENOKOT) 8.6 MG TABS tablet Take 1 tablet by mouth daily as needed for mild constipation.   No facility-administered encounter medications on file as of 07/12/2020.    PHYSICAL EXAM:   Deferred Semisi Biela Z Asyia Hornung, NP

## 2020-08-07 ENCOUNTER — Telehealth: Payer: Self-pay

## 2020-08-07 NOTE — Telephone Encounter (Signed)
Noted  

## 2020-08-07 NOTE — Telephone Encounter (Signed)
If they can print off online and fill in upper section, then bring in here and I will complete for them.  Thank you:)

## 2020-08-07 NOTE — Telephone Encounter (Signed)
Copied from CRM [id number]. Topic: General - Other >> Aug 07, 2020  9:22 AM Gwenlyn Fudge wrote: Reason for CRM: Pts daughter called and is requesting to have an updated Fl2 form so that the pt is able to receive long term care skilled nursing. Please advise.

## 2020-08-07 NOTE — Telephone Encounter (Signed)
Called [name] and advised of [name]'s message she states that she will get it printed and bring in tomorrow

## 2020-08-14 ENCOUNTER — Encounter: Payer: Self-pay | Admitting: Nurse Practitioner

## 2020-08-14 ENCOUNTER — Telehealth (INDEPENDENT_AMBULATORY_CARE_PROVIDER_SITE_OTHER): Payer: Medicare Other | Admitting: Nurse Practitioner

## 2020-08-14 VITALS — Temp 97.2°F

## 2020-08-14 DIAGNOSIS — G20 Parkinson's disease: Secondary | ICD-10-CM

## 2020-08-14 NOTE — Progress Notes (Signed)
Temp (!) 97.2 F (36.2 C) (Axillary)    Subjective:    Patient ID: [name], female    DOB: 21-Sep-1923, 86 y.o.   MRN: [id number]  HPI: [name] is a 86 y.o. female  Chief Complaint  Patient presents with  . Form Completion    Fl-2 form needs completion, patient daughter is requesting not mentioning future care in a facility or nursing home     . This visit was completed via MyChart due to the restrictions of the COVID-19 pandemic. All issues as above were discussed and addressed. Physical exam was done as above through visual confirmation on MyChart. If it was felt that the patient should be evaluated in the office, they were directed there. The patient verbally consented to this visit. . Location of the patient: home . Location of the provider: work . Those involved with this call:  . Provider: Aura Dials, DNP . CMA: Wilhemena Durie, CMA . Front Desk/Registration: Adela Ports  . Time spent on call: 20 minutes with patient face to face via video conference. More than 50% of this time was spent in counseling and coordination of care. 15 minutes total spent in review of patient's record and preparation of their chart.  . I verified patient identity using two factors (patient name and date of birth). Patient consents verbally to being seen via telemedicine visit today.    FORMS -- FL2: Need FL2 filled out for patient, they are looking towards future, [facility].  Reviewed forms with them.  Followed by palliative NP with last visit 07/12/20.  She is bed bound patient with Parkinson's Disease and memory deficit.  Has occasional skin breakdown that is treated.   Relevant past medical, surgical, family and social history reviewed and updated as indicated. Interim medical history since our last visit reviewed. Allergies and medications reviewed and updated.  Review of Systems  Constitutional: Negative for activity change, appetite change,  diaphoresis, fatigue and fever.  Respiratory: Negative for cough, chest tightness and shortness of breath.   Cardiovascular: Negative for chest pain, palpitations and leg swelling.    Per HPI unless specifically indicated above     Objective:    Temp (!) 97.2 F (36.2 C) (Axillary)   Wt Readings from Last 3 Encounters:  03/31/19 95 lb (43.1 kg)  11/08/17 85 lb (38.6 kg)  10/13/16 96 lb 6.4 oz (43.7 kg)    Physical Exam   Unable to perform today, patient resting.  Results for orders placed or performed in visit on 02/20/20  Urine Culture   Specimen: Urine   UR  Result Value Ref Range   Urine Culture, Routine Final report    Organism ID, Bacteria Comment       Assessment & Plan:   Problem List Items Addressed This Visit      Nervous and Auditory   Parkinson's disease (HCC)    Chronic, progressive.  FL-2 form filled out today for family.  Continue collaboration with palliative team.         I discussed the assessment and treatment plan with the patient. The patient was provided an opportunity to ask questions and all were answered. The patient agreed with the plan and demonstrated an understanding of the instructions.   The patient was advised to call back or seek an in-person evaluation if the symptoms worsen or if the condition fails to improve as anticipated.   I provided 21+ minutes of time during this encounter.  Follow up  plan: Return if symptoms worsen or fail to improve.

## 2020-08-14 NOTE — Patient Instructions (Signed)
Skin Care and Bowel Hygiene   Anyone who has frequent bowel movements, diarrhea, or bowel leakage (fecal incontinence) may experience soreness or skin irritation around the anal region.  Occasionally, the skin can become so inflamed that it breaks into open sores.  Prevent skin breakdown by following good skin care habits.  Cleaning and Washing Techniques After having a bowel movement, men and women should tighten their anal sphincter before wiping.  Women should always wipe from front to back to prevent fecal matter from getting into the urethra and vagina.   Tips for Cleaning and Washing . wipe from front to back towards the anus . always wipe gently with soft toilet paper, or ideally with moist toilet paper . wipe only once with each piece of toilet paper so as not to re-contaminate the area . wash in warm water alone or with a minimal amount of mild, fragrance-free soap . use non-biological washing powder . gently pat skin completely dry, avoiding rubbing . if drying the skin after washing is difficult or uncomfortable, try using a hairdryer on a low setting (use very carefully) . allow air to get to the irritated area for some part of every day . use protective skin creams containing zinc as recommended by your doctor  What To Avoid . baths with extra-hot water . soaking for long periods of time in the bathtub . disinfectants and antiseptics  . bath oils, bath salts, and talcum powder . using plastic pants, pads, and sheets, which cause sweating . scratching at the irritated area  Additional Tips . some people find that citrus and acidic foods cause or worsen skin irritation . eat a healthy, balanced diet that is high in fiber  . drink plenty of fluids . wear cotton underwear to allow the skin to breath . talk to your healthcare provider about further treatment options; persistent problems need medical attention  

## 2020-08-14 NOTE — Assessment & Plan Note (Signed)
Chronic, progressive.  FL-2 form filled out today for family.  Continue collaboration with palliative team.

## 2020-08-24 ENCOUNTER — Telehealth: Payer: Medicare Other | Admitting: Orthopedic Surgery

## 2020-08-24 ENCOUNTER — Ambulatory Visit: Payer: Self-pay | Admitting: Nurse Practitioner

## 2020-08-24 DIAGNOSIS — L03039 Cellulitis of unspecified toe: Secondary | ICD-10-CM

## 2020-08-24 MED ORDER — CEPHALEXIN 500 MG PO CAPS: 500.0000 mg | ORAL_CAPSULE | Freq: Four times a day (QID) | ORAL | 0 refills | Status: AC

## 2020-08-24 NOTE — Progress Notes (Signed)
E Visit for Cellulitis  We are sorry that you are not feeling well. Here is how we plan to help!  Based on what you shared with me it looks like you have cellulitis.  Cellulitis looks like areas of skin redness, swelling, and warmth; it develops as a result of bacteria entering under the skin. Little red spots and/or bleeding can be seen in skin, and tiny surface sacs containing fluid can occur. Fever can be present. Cellulitis is almost always on one side of a body, and the lower limbs are the most common site of involvement.   I have prescribed:  Keflex 500mg take one by mouth four times a day for 5 days  HOME CARE:  Take your medications as ordered and take all of them, even if the skin irritation appears to be healing.   GET HELP RIGHT AWAY IF:  Symptoms that don't begin to go away within 48 hours. Severe redness persists or worsens If the area turns color, spreads or swells. If it blisters and opens, develops yellow-brown crust or bleeds. You develop a fever or chills. If the pain increases or becomes unbearable.  Are unable to keep fluids and food down.  MAKE SURE YOU   Understand these instructions. Will watch your condition. Will get help right away if you are not doing well or get worse.  Thank you for choosing an e-visit.  Your e-visit answers were reviewed by a board certified advanced clinical practitioner to complete your personal care plan. Depending upon the condition, your plan could have included both over the counter or prescription medications.  Please review your pharmacy choice. Make sure the pharmacy is open so you can pick up prescription now. If there is a problem, you may contact your provider through MyChart messaging and have the prescription routed to another pharmacy.  Your safety is important to us. If you have drug allergies check your prescription carefully.   For the next 24 hours you can use MyChart to ask questions about today's visit, request a  non-urgent call back, or ask for a work or school excuse. You will get an email in the next two days asking about your experience. I hope that your e-visit has been valuable and will speed your recovery.   Greater than 5 minutes, yet less than 10 minutes of time have been spent researching, coordinating and implementing care for this patient today.   

## 2020-08-24 NOTE — Telephone Encounter (Signed)
  Daughter [name] calling in regarding her mother.   She is not with her mother presently.   Her mother is bedridden and has caregivers with her.  Luster Landsberg is concerned that her mother's  Next to the big toe is dark red.   She noticed it yesterday.   She's had irritation between her big toe and the toe next to it for a few days and Luster Landsberg is wondering if infection is setting in.  She is requesting a doctor see her mother's toe however her mother is bedridden so unable to take her anyway easily. I suggested a MyChart video visit since she has her mother active on MyChart so a provider could see her toe via video.   [name] was agreeable to this plan.  I instructed her how to do it via MyChart.   I instructed [name] to please call us back if she has any difficulty with doing the video visit.   She agreed to this.   Reason for Disposition . Localized redness and swelling of skin around nail (i.e., cuticle area or nail fold)    Toe next to big toe is dark red with irritation between the 2 toes.  Started yesterday.  Answer Assessment - Initial Assessment Questions 1. ONSET: "When did the pain start?"      Daughter [name] calling in for mother.   Mother has irritation between bit toe and next toe for a while.   The 2nd toe is dark red and swollen starting yesterday and the irritation is worse. Her mother is bedridden and has caregivers around the clock.   The daughter is not with her mother presently.    2. LOCATION: "Where is the pain located?"   (e.g., around nail, entire toe, at foot joint)      See above 3. PAIN: "How bad is the pain?"    (Scale 1-10; or mild, moderate, severe)   -  MILD (1-3): doesn't interfere with normal activities    -  MODERATE (4-7): interferes with normal activities (e.g., work or school) or awakens from sleep, limping    -  SEVERE (8-10): excruciating pain, unable to do any normal activities, unable to walk     She winces when it's touched. 4. APPEARANCE: "What  does the toe look like?" (e.g., redness, swelling, bruising, pallor)     It's dark red with irritation between the toes-big toe and toe next to it. 5. CAUSE: "What do you think is causing the toe pain?"     I hope she's not getting an infection from the irritation she's had between her 2 toes. 6. OTHER SYMPTOMS: "Do you have any other symptoms?" (e.g., leg pain, rash, fever, numbness)     No.  Just pain when it's touched. 7. PREGNANCY: "Is there any chance you are pregnant?" "When was your last menstrual period?"     N/A due to age  Protocols used: TOE PAIN-A-AH

## 2020-08-27 ENCOUNTER — Encounter: Payer: Self-pay | Admitting: Nurse Practitioner

## 2020-08-27 ENCOUNTER — Other Ambulatory Visit: Payer: Self-pay | Admitting: Nurse Practitioner

## 2020-08-27 ENCOUNTER — Other Ambulatory Visit: Payer: Self-pay

## 2020-08-27 ENCOUNTER — Other Ambulatory Visit: Payer: Medicare Other | Admitting: Nurse Practitioner

## 2020-08-27 DIAGNOSIS — Z515 Encounter for palliative care: Secondary | ICD-10-CM

## 2020-08-27 DIAGNOSIS — Z5189 Encounter for other specified aftercare: Secondary | ICD-10-CM

## 2020-08-27 DIAGNOSIS — L0390 Cellulitis, unspecified: Secondary | ICD-10-CM

## 2020-08-27 NOTE — Progress Notes (Signed)
Therapist, nutritional Palliative Care Consult Note Telephone: [phone]  Fax: [phone]     Date of encounter: 08/27/20  PATIENT NAME: LYNIX BONINE [address] [place] [address] [phone] (home)  DOB: 1923/03/09 MRN: [id number]  PRIMARY CARE PROVIDER:    Marjie Skiff, NP,  [address] [place] [address] [phone]  RESPONSIBLE PARTY:   [name]; daughter  Due to the COVID-19 crisis, this visit was done via telemedicine from my office and it was initiated and consent by this patient and or family   ASSESSMENT AND RECOMMENDATIONS:   Advance Care Planning      1. Advance Care Planning DNR; wishes are to focus on comfort at home  Goals include to maximize quality of life and symptom management. Our advance care planning conversation included a discussion about:     The value and importance of advance care planning   Experiences with loved ones who have been seriously ill or have died   Exploration of personal, cultural or spiritual beliefs that might influence medical decisions   Exploration of goals of care in the event of a sudden injury or illness   Identification and preparation of a healthcare agent   Review and updating or creation of an  advance directive document .  Decision not to resuscitate or to de-escalate disease focused treatments due to poor prognosis.  2. Symptom Management: wound appears stage 3 by video visualization. Discussed with [name] daughter to continue Keflex 500mg  qid x 5 more days; schedule f/u video visit with primary care provider; also will ask primary provider to order home health nursing for wound care. Discussed at length s/s to look for for worsening, erythema into foot and possible need at that time for IV antibiotics. [name] in agreement. Monitor for fever  Rx: Called into [facility] Keflex 500mg  QID x 5 days; No RF; #20  3. Follow up Palliative Care Visit: Palliative care will  continue to follow for goals of care clarification and symptom management. Return 2 weeks or prn.  I spent 25 minutes providing this consultation. More than 50% of the time in this consultation was spent in discussion/coordinating communication.   CODE STATUS: DNR  PPS: 30%  HOSPICE ELIGIBILITY/DIAGNOSIS: TBD  CHIEF COMPLAIN: daughter endorses "wound to left 2nd toe"  HISTORY OF PRESENT ILLNESS:  ALENA BLANKENBECKLER is a 86 y.o. year old female experiencing a wound, secondary to debility, onset 5 days ago, left 2nd toe with redness into left foot at base of toes, erythema with improvement per [name] (daughter) since initiation of Keflex 4 days ago. Follow-up Palliative care visit requested by daughter, 89 for evaluation of wound, treatment options.   CURRENT PROBLEM LIST:  Patient Active Problem List   Diagnosis Date Noted  . UTI symptoms 02/20/2020  . Tinea corporis 03/31/2019  . Debility 03/07/2019  . Memory deficits 03/07/2019  . Palliative care encounter 03/07/2019  . Constipation 03/10/2018  . Protein-calorie malnutrition, severe 11/10/2017  . Pressure injury of skin 11/09/2017  . Bedsore 12/18/2016  . Parkinson's disease (HCC) 12/16/2015  . Dysphagia 12/16/2015  . B12 deficiency 10/29/2015  . Traumatic closed displaced fracture of multiple ribs 10/29/2015  . Hypertension    PAST MEDICAL HISTORY:  Active Ambulatory Problems    Diagnosis Date Noted  . Hypertension   . B12 deficiency 10/29/2015  . Traumatic closed displaced fracture of multiple ribs 10/29/2015  . Parkinson's disease (HCC) 12/16/2015  . Dysphagia 12/16/2015  .  Bedsore 12/18/2016  . Pressure injury of skin 11/09/2017  . Protein-calorie malnutrition, severe 11/10/2017  . Constipation 03/10/2018  . Debility 03/07/2019  . Memory deficits 03/07/2019  . Palliative care encounter 03/07/2019  . Tinea corporis 03/31/2019  . UTI symptoms 02/20/2020   Resolved Ambulatory Problems    Diagnosis Date Noted   . Tremor 07/04/2015  . Confusion 11/12/2015  . Altered mental status 12/16/2015  . Abnormal CXR 12/16/2015  . Pneumonia 12/17/2015  . Perimenopausal atrophic vaginitis 03/03/2016  . Cloudy urine 07/16/2016  . Sepsis (HCC) 11/08/2017  . UTI (urinary tract infection) 02/07/2019   Past Medical History:  Diagnosis Date  . Anemia   . Arm fracture 03/2016  . Arthritis   . Mild dementia (HCC)   . Parkinson disease (HCC)    SOCIAL HX:  Social History   Tobacco Use  . Smoking status: Never Smoker  . Smokeless tobacco: Never Used  Substance Use Topics  . Alcohol use: No    Alcohol/week: 0.0 standard drinks   FAMILY HX:  Family History  Family history unknown: Yes    ALLERGIES:  Allergies  Allergen Reactions  . Phenergan [Promethazine Hcl] Other (See Comments)    Family states the patient almost went into a coma.  . Sulfa Antibiotics Other (See Comments)    Unknown reaction  . Sulfasalazine Other (See Comments)    Unknown reaction     PERTINENT MEDICATIONS:  Outpatient Encounter Medications as of 08/27/2020  Medication Sig  . acetaminophen (TYLENOL) 325 MG tablet Take 650 mg by mouth 2 (two) times daily as needed.   . Calcium Carbonate (CALCIUM-CARB 600 PO) Take 600 mg by mouth 2 (two) times daily.   . Carbidopa-Levodopa ER (SINEMET CR) 25-100 MG tablet controlled release Take 1 tablet by mouth 2 (two) times daily.  . cephALEXin (KEFLEX) 500 MG capsule Take 1 capsule (500 mg total) by mouth 4 (four) times daily for 5 days.  Marland Kitchen docusate sodium (COLACE) 100 MG capsule Take 100 mg by mouth 2 (two) times daily.  Marland Kitchen doxycycline (VIBRA-TABS) 100 MG tablet Take 1 tablet (100 mg total) by mouth 2 (two) times daily.  Marland Kitchen ketoconazole (NIZORAL) 2 % cream SMARTSIG:Sparingly Topical Twice Daily  . Multiple Vitamins-Minerals (MULTIVITAMIN ADULT PO) Take 1 tablet by mouth daily.   . mupirocin ointment (BACTROBAN) 2 % Apply 1 application topically 2 (two) times daily. Apply to wound as  instructed.  . nystatin (MYCOSTATIN/NYSTOP) powder APPLY TO AFFECTED AREA 4 TIMES A DAY  . senna (SENOKOT) 8.6 MG TABS tablet Take 1 tablet by mouth daily as needed for mild constipation.   No facility-administered encounter medications on file as of 08/27/2020.   ROS  Review of Systems: All other systems asked and negative except as noted in HPI.  Physical Exam: visualized through video;   Constitutional: NAD General :frail appearing, debilitated, pale cachetic confused female;  Skin: warm and dry, visible appears stage 3 to left lateral 2nd toe, moist, erythema into foot.  Psych: flat affect on video  Medical Decision Making  Number of diagnosis  New dx/problem: wound stage 3 to left lateral 2nd toe.  Discussed with [name] daughter to continue Keflex 500mg  qid x 5 more days; schedule f/u video visit with primary care provider; also will ask primary provider to order home health nursing for wound care. Discussed at length s/s to look for for worsening, erythema into foot and possible need at that time for IV antibiotics. [name] in agreement. Monitor for fever  Thank  you for the opportunity to participate in the care of Ms. Clelia Croft.  The palliative care team will continue to follow. Please call our office at [phone] if we can be of additional assistance.  Evelin Cake [place], NP

## 2020-08-28 ENCOUNTER — Telehealth: Payer: Self-pay

## 2020-08-28 NOTE — Telephone Encounter (Signed)
Completed.

## 2020-08-28 NOTE — Telephone Encounter (Signed)
In your folder 

## 2020-08-28 NOTE — Telephone Encounter (Signed)
Pt stated she brought in forms that need to be redone per the facility Pt's daughter wrote a note with more indications on the forms. Everything placed in bin to be reviewed.

## 2020-08-29 ENCOUNTER — Telehealth (INDEPENDENT_AMBULATORY_CARE_PROVIDER_SITE_OTHER): Payer: Medicare Other | Admitting: Nurse Practitioner

## 2020-08-29 ENCOUNTER — Encounter: Payer: Self-pay | Admitting: Nurse Practitioner

## 2020-08-29 DIAGNOSIS — L89892 Pressure ulcer of other site, stage 2: Secondary | ICD-10-CM

## 2020-08-29 NOTE — Patient Instructions (Signed)
Pressure Injury  A pressure injury is damage to the skin and underlying tissue that results from pressure being applied to an area of the body. It often affects people who must spend a long time in a bed or chair because of a medical condition. Pressure injuries usually occur:  Over bony parts of the body, such as the tailbone, shoulders, elbows, hips, heels, spine, ankles, and back of the head.  Under medical devices that make contact with the body, such as respiratory equipment, stockings, tubes, and splints. Pressure injuries start as reddened areas on the skin and can lead to pain and an open wound. What are the causes? This condition is caused by frequent or constant pressure to an area of the body. Decreased blood flow to the skin can eventually cause the skin tissue to die and break down, causing a wound. What increases the risk? You are more likely to develop this condition if you:  Are in the hospital or an extended care facility.  Are bedridden or in a wheelchair.  Have an injury or disease that keeps you from: ? Moving normally. ? Feeling pain or pressure.  Have a condition that: ? Makes you sleepy or less alert. ? Causes poor blood flow.  Need to wear a medical device.  Have poor control of your bladder or bowel functions (incontinence).  Have poor nutrition (malnutrition). If you are at risk for pressure injuries, your health care provider may recommend certain types of mattresses, mattress covers, pillows, cushions, or boots to help prevent them. These may include products filled with air, foam, gel, or sand. What are the signs or symptoms? Symptoms of this condition depend on the severity of the injury. Symptoms may include:  Red or dark areas of the skin.  Pain, warmth, or a change of skin texture.  Blisters.  An open wound. How is this diagnosed? This condition is diagnosed with a medical history and physical exam. You may also have tests, such as:  Blood  tests.  Imaging tests.  Blood flow tests. Your pressure injury will be staged based on its severity. Staging is based on:  The depth of the tissue injury, including whether there is exposure of muscle, bone, or tendon.  The cause of the pressure injury. How is this treated? This condition may be treated by:  Relieving or redistributing pressure on your skin. This includes: ? Frequently changing your position. ? Avoiding positions that caused the wound or that can make the wound worse. ? Using specific bed mattresses, chair cushions, or protective boots. ? Moving medical devices from an area of pressure, or placing padding between the skin and the device. ? Using foams, creams, or powders to prevent rubbing (friction) on the skin.  Keeping your skin clean and dry. This may include using a skin cleanser or skin barrier as told by your health care provider.  Cleaning your injury and removing any dead tissue from the wound (debridement).  Placing a bandage (dressing) over your injury.  Using medicines for pain or to prevent or treat infection. Surgery may be needed if other treatments are not working or if your injury is very deep. Follow these instructions at home: Wound care  Follow instructions from your health care provider about how to take care of your wound. Make sure you: ? Wash your hands with soap and water before and after you change your bandage (dressing). If soap and water are not available, use hand sanitizer. ? Change your dressing as told   by your health care provider.  Check your wound every day for signs of infection. Have a caregiver do this for you if you are not able. Check for: ? Redness, swelling, or increased pain. ? More fluid or blood. ? Warmth. ? Pus or a bad smell. Skin care  Keep your skin clean and dry. Gently pat your skin dry.  Do not rub or massage your skin.  You or a caregiver should check your skin every day for any changes in color or  any new blisters or sores (ulcers). Medicines  Take over-the-counter and prescription medicines only as told by your health care provider.  If you were prescribed an antibiotic medicine, take or apply it as told by your health care provider. Do not stop using the antibiotic even if your condition improves. Reducing and redistributing pressure  Do not lie or sit in one position for a long time. Move or change position every 1-2 hours, or as told by your health care provider.  Use pillows or cushions to reduce pressure. Ask your health care provider to recommend cushions or pads for you. General instructions   Eat a healthy diet that includes lots of protein.  Drink enough fluid to keep your urine pale yellow.  Be as active as you can every day. Ask your health care provider to suggest safe exercises or activities.  Do not abuse drugs or alcohol.  Do not use any products that contain nicotine or tobacco, such as cigarettes, e-cigarettes, and chewing tobacco. If you need help quitting, ask your health care provider.  Keep all follow-up visits as told by your health care provider. This is important. Contact a health care provider if:  You have: ? A fever or chills. ? Pain that is not helped by medicine. ? Any changes in skin color. ? New blisters or sores. ? Pus or a bad smell coming from your wound. ? Redness, swelling, or pain around your wound. ? More fluid or blood coming from your wound.  Your wound does not improve after 1-2 weeks of treatment. Summary  A pressure injury is damage to the skin and underlying tissue that results from pressure being applied to an area of the body.  Do not lie or sit in one position for a long time. Your health care provider may advise you to move or change position every 1-2 hours.  Follow instructions from your health care provider about how to take care of your wound.  Keep all follow-up visits as told by your health care provider. This  is important. This information is not intended to replace advice given to you by your health care provider. Make sure you discuss any questions you have with your health care provider. Document Revised: 04/14/2018 Document Reviewed: 04/14/2018 Elsevier Patient Education  2020 [employer]  

## 2020-08-29 NOTE — Assessment & Plan Note (Signed)
To left 2nd toe and right ear.  Will continue Bactroban and Keflex at this time and recommend continue this, then gently cover with non stick Vaseline gauze.  Recommend placing lamb's wool between toes to prevent further pressure.  Monitor for s/s infection and alert provider immediately if present.  Continue collaboration with palliative team.  Home health referral in place.  Discussed at length benefit of hospice care with her daughter today, they are hesitant about this as concerned there will be no treatment.  Discussed with her hospice would still treat a UTI or wound + would offer extra hands to care for patient and a nurse for wound care.  She will talk to sisters about this.  Follow-up in 4 weeks.

## 2020-08-29 NOTE — Progress Notes (Signed)
Pulse 60   Temp 97.8 F (36.6 C)   Wt 95 lb (43.1 kg)   BMI 17.38 kg/m    Subjective:    Patient ID: [name], female    DOB: 01-11-1923, 86 y.o.   MRN: [id number]  HPI: [name] is a 86 y.o. female  Chief Complaint  Patient presents with  . ear concern  . Nail Problem    . This visit was completed via MyChart due to the restrictions of the COVID-19 pandemic. All issues as above were discussed and addressed. Physical exam was done as above through visual confirmation on MyChart. If it was felt that the patient should be evaluated in the office, they were directed there. The patient verbally consented to this visit. . Location of the patient: home . Location of the provider: work . Those involved with this call:  . Provider: Aura Dials, DNP . CMA: Wilhemena Durie, CMA . Front Desk/Registration: Adela Ports  . Time spent on call: 20 minutes with patient face to face via video conference. More than 50% of this time was spent in counseling and coordination of care. 15 minutes total spent in review of patient's record and preparation of their chart.  . I verified patient identity using two factors (patient name and date of birth). Patient consents verbally to being seen via telemedicine visit today.   TOE INFECTION: Left foot wound to 2nd toe (between great toe and 2nd toe), was started on Cephalexin 08/24/20 by e-visit.  They have given first 5 days and the palliative NP extended it 5 days.  Try to keep toes separated to get some air.  They have been placing abx ointment between the toes and small piece of petroleum gauze.  Then they place sock over area.  Her daughter reports it looks a little better today.  Erythema to top of foot is improving with marking area and the wound is draining less they report. Patient is bed bound at baseline and high risk for skin breakdown, followed by palliative.  Home health order placed this week, but unable to find agency  that will care for patient at home,  Also has pressure area to right ear, which waxes and wanes.  They use abx ointment and Vaseline gauze + let air during daytime.     Relevant past medical, surgical, family and social history reviewed and updated as indicated. Interim medical history since our last visit reviewed. Allergies and medications reviewed and updated.  Review of Systems  Constitutional: Negative for activity change, appetite change, diaphoresis, fatigue and fever.  Respiratory: Negative for cough, chest tightness and shortness of breath.   Cardiovascular: Negative for chest pain, palpitations and leg swelling.  Skin: Positive for wound.    Per HPI unless specifically indicated above     Objective:    Pulse 60   Temp 97.8 F (36.6 C)   Wt 95 lb (43.1 kg)   BMI 17.38 kg/m   Wt Readings from Last 3 Encounters:  08/29/20 95 lb (43.1 kg)  03/31/19 95 lb (43.1 kg)  11/08/17 85 lb (38.6 kg)    Physical Exam Vitals and nursing note reviewed.  Constitutional:      General: She is awake. She is not in acute distress.    Appearance: She is well-developed. She is not ill-appearing.  HENT:     Head: Normocephalic.     Right Ear: Hearing normal.     Left Ear: Hearing normal.  Eyes:  General: Lids are normal.        Right eye: No discharge.        Left eye: No discharge.     Conjunctiva/sclera: Conjunctivae normal.  Pulmonary:     Effort: Pulmonary effort is normal. No accessory muscle usage or respiratory distress.  Musculoskeletal:     Cervical back: Normal range of motion.  Feet:     Comments: Viewed via virtual.  Wound present left foot to 2nd toe.  Mild erythema to dorsal aspect foot extending from 2nd toe to approx 2 cm dorsal aspect foot, which family reports is improved. Mild erythema to dorsal aspect 2nd toe.  Wound approx 1 cm with darker wound bed and areas of crusting, no drainage noted.   Skin:    Comments: Viewed via virtual right ear with pressure  wound to helix.  Approx 1 cm with yellow crusting over area and mild erythema.  No drainage noted.  Neurological:     Mental Status: She is alert and oriented to person, place, and time.  Psychiatric:        Attention and Perception: Attention normal.        Mood and Affect: Mood normal.        Behavior: Behavior normal. Behavior is cooperative.        Thought Content: Thought content normal.        Judgment: Judgment normal.      Results for orders placed or performed in visit on 02/20/20  Urine Culture   Specimen: Urine   UR  Result Value Ref Range   Urine Culture, Routine Final report    Organism ID, Bacteria Comment       Assessment & Plan:   Problem List Items Addressed This Visit      Musculoskeletal and Integument   Pressure injury of skin    To left 2nd toe and right ear.  Will continue Bactroban and Keflex at this time and recommend continue this, then gently cover with non stick Vaseline gauze.  Recommend placing lamb's wool between toes to prevent further pressure.  Monitor for s/s infection and alert provider immediately if present.  Continue collaboration with palliative team.  Home health referral in place.  Discussed at length benefit of hospice care with her daughter today, they are hesitant about this as concerned there will be no treatment.  Discussed with her hospice would still treat a UTI or wound + would offer extra hands to care for patient and a nurse for wound care.  She will talk to sisters about this.  Follow-up in 4 weeks.         I discussed the assessment and treatment plan with the patient. The patient was provided an opportunity to ask questions and all were answered. The patient agreed with the plan and demonstrated an understanding of the instructions.   The patient was advised to call back or seek an in-person evaluation if the symptoms worsen or if the condition fails to improve as anticipated.   I provided 21+ minutes of time during this  encounter.  Follow up plan: Return in about 4 weeks (around 09/26/2020).

## 2020-08-30 ENCOUNTER — Telehealth: Payer: Medicare Other | Admitting: Nurse Practitioner

## 2020-08-30 ENCOUNTER — Telehealth: Payer: Self-pay

## 2020-08-30 ENCOUNTER — Other Ambulatory Visit: Payer: Self-pay | Admitting: Nurse Practitioner

## 2020-08-30 DIAGNOSIS — L89892 Pressure ulcer of other site, stage 2: Secondary | ICD-10-CM

## 2020-08-30 NOTE — Telephone Encounter (Signed)
Copied from CRM [id number]. Topic: Referral - Status >> Aug 30, 2020  8:33 AM Crist Infante wrote: Daughter calling to follow up on urgent referral to [facility].  She states they do not have and their appts are really tight.  Please send as soon as you can.

## 2020-08-30 NOTE — Progress Notes (Signed)
Wound referral

## 2020-08-31 ENCOUNTER — Other Ambulatory Visit
Admission: RE | Admit: 2020-08-31 | Discharge: 2020-08-31 | Disposition: A | Discharge status: Home / Self Care | Payer: Medicare Other | Source: Ambulatory Visit | Attending: Physician Assistant | Admitting: Physician Assistant

## 2020-08-31 ENCOUNTER — Ambulatory Visit
Admission: RE | Admit: 2020-08-31 | Discharge: 2020-08-31 | Disposition: A | Discharge status: Home / Self Care | Payer: Medicare Other | Attending: Physician Assistant | Admitting: Physician Assistant

## 2020-08-31 ENCOUNTER — Other Ambulatory Visit: Payer: Self-pay | Admitting: Physician Assistant

## 2020-08-31 ENCOUNTER — Ambulatory Visit
Admission: RE | Admit: 2020-08-31 | Discharge: 2020-08-31 | Disposition: A | Discharge status: Home / Self Care | Payer: Medicare Other | Source: Ambulatory Visit | Attending: Physician Assistant | Admitting: Physician Assistant

## 2020-08-31 ENCOUNTER — Other Ambulatory Visit: Payer: Self-pay

## 2020-08-31 ENCOUNTER — Encounter: Payer: Medicare Other | Attending: Physician Assistant | Admitting: Physician Assistant

## 2020-08-31 DIAGNOSIS — F0280 Dementia in other diseases classified elsewhere without behavioral disturbance: Secondary | ICD-10-CM | POA: Insufficient documentation

## 2020-08-31 DIAGNOSIS — L97529 Non-pressure chronic ulcer of other part of left foot with unspecified severity: Secondary | ICD-10-CM | POA: Insufficient documentation

## 2020-08-31 DIAGNOSIS — L89893 Pressure ulcer of other site, stage 3: Secondary | ICD-10-CM | POA: Insufficient documentation

## 2020-08-31 DIAGNOSIS — L89892 Pressure ulcer of other site, stage 2: Secondary | ICD-10-CM | POA: Diagnosis not present

## 2020-08-31 DIAGNOSIS — M86172 Other acute osteomyelitis, left ankle and foot: Secondary | ICD-10-CM | POA: Insufficient documentation

## 2020-08-31 DIAGNOSIS — G20 Parkinson's disease: Secondary | ICD-10-CM | POA: Diagnosis not present

## 2020-08-31 DIAGNOSIS — S51812A Laceration without foreign body of left forearm, initial encounter: Secondary | ICD-10-CM | POA: Diagnosis not present

## 2020-09-03 LAB — AEROBIC CULTURE W GRAM STAIN (SUPERFICIAL SPECIMEN)

## 2020-09-03 LAB — SURGICAL PATHOLOGY

## 2020-09-03 NOTE — Progress Notes (Signed)
[name], [name] ([id number]) Visit Report for 08/31/2020 Allergy List Details Patient Name: [name], [name] Date of Service: 08/31/2020 10:30 AM Medical Record Number: [id number] Patient Account Number: [id number] Date of Birth/Sex: [birth year] (86 y.o. F) Treating RN: Rogers Blocker Primary Care Sahvannah Rieser: Aura Dials Other Clinician: Referring Kass Herberger: Aura Dials Treating Flay Ghosh/Extender: Allen Derry Weeks in Treatment: 0 Allergies Active Allergies Phenergan Sulfa (Sulfonamide Antibiotics) Allergy Notes Electronic Signature(s) Signed: 09/03/2020 2:10:08 PM By: Phillis Haggis, Dondra Prader Entered By: Phillis Haggis, Dondra Prader on 08/31/2020 10:25:56 [name] ([id number]) -------------------------------------------------------------------------------- Arrival Information Details Patient Name: [name] Date of Service: 08/31/2020 10:30 AM Medical Record Number: [id number] Patient Account Number: [id number] Date of Birth/Sex: 09/27/1923 (86 y.o. F) Treating RN: Rogers Blocker Primary Care Natsumi Whitsitt: Aura Dials Other Clinician: Referring Chanler Mendonca: Aura Dials Treating Winslow Ederer/Extender: Rowan Blase in Treatment: 0 Visit Information Patient Arrived: Wheel Chair Arrival Time: 10:25 Accompanied By: daughter-renee Transfer Assistance: None Patient Identification Verified: Yes Secondary Verification Process Completed: Yes History Since Last Visit Electronic Signature(s) Signed: 09/03/2020 2:10:08 PM By: Phillis Haggis, Dondra Prader Entered By: Phillis Haggis, Dondra Prader on 08/31/2020 10:35:14 [name] ([id number]) -------------------------------------------------------------------------------- Encounter Discharge Information Details Patient Name: [name] Date of Service: 08/31/2020 10:30 AM Medical Record Number: [id number] Patient Account Number: [id number] Date of Birth/Sex: 06/08/1923 (86 y.o. F) Treating RN: Rogers Blocker Primary Care  Kris Burd: Aura Dials Other Clinician: Referring Oiva Dibari: Aura Dials Treating Olman Yono/Extender: Rowan Blase in Treatment: 0 Encounter Discharge Information Items Post Procedure Vitals Discharge Condition: Stable Temperature (F): 98.1 Ambulatory Status: Wheelchair Pulse (bpm): 92 Discharge Destination: Home Respiratory Rate (breaths/min): 16 Transportation: Private Auto Blood Pressure (mmHg): 116/72 Accompanied By: daughter Schedule Follow-up Appointment: Yes Clinical Summary of Care: Electronic Signature(s) Signed: 08/31/2020 3:51:58 PM By: Phillis Haggis, Dondra Prader Previous Signature: 08/31/2020 11:55:25 AM Version By: Phillis Haggis, Dondra Prader Entered By: Phillis Haggis, Dondra Prader on 08/31/2020 15:51:58 [name] ([id number]) -------------------------------------------------------------------------------- Lower Extremity Assessment Details Patient Name: [name] Date of Service: 08/31/2020 10:30 AM Medical Record Number: [id number] Patient Account Number: [id number] Date of Birth/Sex: [birth year] (86 y.o. F) Treating RN: Rogers Blocker Primary Care Nancyjo Givhan: Aura Dials Other Clinician: Referring Zala Degrasse: Aura Dials Treating Kariya Lavergne/Extender: Rowan Blase in Treatment: 0 Electronic Signature(s) Signed: 09/03/2020 2:10:08 PM By: Phillis Haggis, Dondra Prader Entered By: Phillis Haggis, Dondra Prader on 08/31/2020 10:52:40 [name] ([id number]) -------------------------------------------------------------------------------- Multi Wound Chart Details Patient Name: [name] Date of Service: 08/31/2020 10:30 AM Medical Record Number: [id number] Patient Account Number: [id number] Date of Birth/Sex: 1923-02-14 (86 y.o. F) Treating RN: Rogers Blocker Primary Care Josiah Nieto: Aura Dials Other Clinician: Referring Larinda Herter: Aura Dials Treating Dayrin Stallone/Extender: Rowan Blase in Treatment: 0 Vital Signs Height(in): 60 Pulse(bpm):  92 Weight(lbs): 95 Blood Pressure(mmHg): 116/72 Body Mass Index(BMI): 19 Temperature(F): 98.1 Respiratory Rate(breaths/min): 16 Photos: [N/A:N/A] Wound Location: Toe Second N/A N/A Wounding Event: Pressure Injury N/A N/A Primary Etiology: Pressure Ulcer N/A N/A Comorbid History: Anemia, Hypertension, Dementia N/A N/A Date Acquired: 08/23/2020 N/A N/A Weeks of Treatment: 0 N/A N/A Wound Status: Open N/A N/A Measurements L x W x D (cm) 0.6x0.7x0.3 N/A N/A Area (cm) : 0.33 N/A N/A Volume (cm) : 0.099 N/A N/A % Reduction in Area: 0.00% N/A N/A % Reduction in Volume: 0.00% N/A N/A Classification: Category/Stage III N/A N/A Exudate Amount: Medium N/A N/A Exudate Type: Serosanguineous N/A N/A Exudate Color: red, brown N/A N/A Wound Margin: Distinct, outline attached N/A N/A Granulation Amount: Medium (34-66%) N/A N/A Granulation Quality: Red N/A N/A Necrotic Amount: Small (1-33%) N/A N/A  Exposed Structures: Fat Layer (Subcutaneous Tissue): N/A N/A Yes Fascia: No Tendon: No Muscle: No Joint: No Bone: No Epithelialization: None N/A N/A Treatment Notes Electronic Signature(s) Signed: 09/03/2020 2:10:08 PM By: Phillis Haggis, Dondra Prader Entered By: Phillis Haggis, Dondra Prader on 08/31/2020 11:11:41 [name] ([id number]) -------------------------------------------------------------------------------- Multi-Disciplinary Care Plan Details Patient Name: [name] Date of Service: 08/31/2020 10:30 AM Medical Record Number: [id number] Patient Account Number: [id number] Date of Birth/Sex: Jul 14, 1923 (86 y.o. F) Treating RN: Rogers Blocker Primary Care Marrion Finan: Aura Dials Other Clinician: Referring Calea Hribar: Aura Dials Treating Zuhair Lariccia/Extender: Rowan Blase in Treatment: 0 Active Inactive Necrotic Tissue Nursing Diagnoses: Impaired tissue integrity related to necrotic/devitalized tissue Goals: Necrotic/devitalized tissue will be minimized in the wound  bed Date Initiated: 08/31/2020 Target Resolution Date: 10/01/2020 Goal Status: Active Patient/caregiver will verbalize understanding of reason and process for debridement of necrotic tissue Date Initiated: 08/31/2020 Target Resolution Date: 10/01/2020 Goal Status: Active Interventions: Assess patient pain level pre-, during and post procedure and prior to discharge Provide education on necrotic tissue and debridement process Treatment Activities: Apply topical anesthetic as ordered : 08/31/2020 Biologic debridement : 08/31/2020 Enzymatic debridement : 08/31/2020 Excisional debridement : 08/31/2020 Test ordered outside of clinic : 08/31/2020 Notes: Nutrition Nursing Diagnoses: Imbalanced nutrition Goals: Patient/caregiver agrees to and verbalizes understanding of need to use nutritional supplements and/or vitamins as prescribed Date Initiated: 08/31/2020 Target Resolution Date: 10/01/2020 Goal Status: Active Interventions: Assess patient nutrition upon admission and as needed per policy Notes: Orientation to the Wound Care Program Nursing Diagnoses: Knowledge deficit related to the wound healing center program Goals: Patient/caregiver will verbalize understanding of the Wound Healing Center Program Date Initiated: 08/31/2020 Target Resolution Date: 10/01/2020 Goal Status: Active Interventions: Provide education on orientation to the wound center [name], [name]. ([id number]) Notes: Pressure Nursing Diagnoses: Knowledge deficit related to causes and risk factors for pressure ulcer development Goals: Patient will remain free from development of additional pressure ulcers Date Initiated: 08/31/2020 Target Resolution Date: 10/01/2020 Goal Status: Active Patient/caregiver will verbalize risk factors for pressure ulcer development Date Initiated: 08/31/2020 Target Resolution Date: 10/01/2020 Goal Status: Active Patient/caregiver will verbalize understanding of pressure ulcer management Date  Initiated: 08/31/2020 Target Resolution Date: 10/01/2020 Goal Status: Active Interventions: Assess: immobility, friction, shearing, incontinence upon admission and as needed Assess potential for pressure ulcer upon admission and as needed Provide education on pressure ulcers Notes: Wound/Skin Impairment Nursing Diagnoses: Impaired tissue integrity Goals: Patient will have a decrease in wound volume by X% from date: (specify in notes) Date Initiated: 08/31/2020 Target Resolution Date: 10/01/2020 Goal Status: Active Patient/caregiver will verbalize understanding of skin care regimen Date Initiated: 08/31/2020 Target Resolution Date: 10/01/2020 Goal Status: Active Interventions: Assess patient/caregiver ability to obtain necessary supplies Assess patient/caregiver ability to perform ulcer/skin care regimen upon admission and as needed Assess ulceration(s) every visit Provide education on ulcer and skin care Treatment Activities: Skin care regimen initiated : 08/31/2020 Notes: Electronic Signature(s) Signed: 09/03/2020 2:10:08 PM By: Phillis Haggis, Dondra Prader Entered By: Phillis Haggis, Dondra Prader on 08/31/2020 11:11:25 [name] ([id number]) -------------------------------------------------------------------------------- Pain Assessment Details Patient Name: [name] Date of Service: 08/31/2020 10:30 AM Medical Record Number: [id number] Patient Account Number: [id number] Date of Birth/Sex: 1922/10/02 (86 y.o. F) Treating RN: Rogers Blocker Primary Care Markelle Asaro: Aura Dials Other Clinician: Referring Shed Nixon: Aura Dials Treating Brentlee Sciara/Extender: Rowan Blase in Treatment: 0 Active Problems Location of Pain Severity and Description of Pain Patient Has Paino Patient Unable to Respond Site Locations Pain Management and Medication Current Pain Management: Electronic  Signature(s) Signed: 09/03/2020 2:10:08 PM By: Phillis Haggis, Dondra Prader Entered By: Phillis Haggis, Dondra Prader on 08/31/2020 10:35:18 [name] ([id number]) -------------------------------------------------------------------------------- Wound Assessment Details Patient Name: [name] Date of Service: 08/31/2020 10:30 AM Medical Record Number: [id number] Patient Account Number: [id number] Date of Birth/Sex: 1923-02-01 (86 y.o. F) Treating RN: Rogers Blocker Primary Care Kassidy Frankson: Aura Dials Other Clinician: Referring Zarayah Lanting: Aura Dials Treating Daliana Leverett/Extender: Rowan Blase in Treatment: 0 Wound Status Wound Number: 14 Primary Etiology: Pressure Ulcer Wound Location: Toe Second Wound Status: Open Wounding Event: Pressure Injury Comorbid History: Anemia, Hypertension, Dementia Date Acquired: 08/23/2020 Weeks Of Treatment: 0 Clustered Wound: No Photos Wound Measurements Length: (cm) 0.6 % Width: (cm) 0.7 % Depth: (cm) 0.3 Ep Area: (cm) 0.33 T Volume: (cm) 0.099 U Reduction in Area: 0% Reduction in Volume: 0% ithelialization: None unneling: No ndermining: No Wound Description Classification: Category/Stage IV Fo Wound Margin: Distinct, outline attached Sl Exudate Amount: Medium Exudate Type: Serosanguineous Exudate Color: red, brown ul Odor After Cleansing: No ough/Fibrino Yes Wound Bed Granulation Amount: Medium (34-66%) Exposed Structure Granulation Quality: Red Fascia Exposed: No Necrotic Amount: Small (1-33%) Fat Layer (Subcutaneous Tissue) Exposed: Yes Necrotic Quality: Adherent Slough Tendon Exposed: Yes Muscle Exposed: No Joint Exposed: No Bone Exposed: Yes Treatment Notes Wound #14 (Toe Second) Notes plain alginate (pt going to get x-ray), conform and tape Electronic Signature(s) Signed: 08/31/2020 11:55:53 AM By: Riley Lam, Myles Gip ([id number]) Signed: 09/03/2020 2:10:08 PM By: Lajean Manes Previous Signature: 08/31/2020 10:59:01 AM Version By: Lenda Kelp PA-C Entered By: Lenda Kelp on 08/31/2020 11:55:52 [name] ([id number]) -------------------------------------------------------------------------------- Vitals Details Patient Name: [name] Date of Service: 08/31/2020 10:30 AM Medical Record Number: [id number] Patient Account Number: [id number] Date of Birth/Sex: [birth year] (86 y.o. F) Treating RN: Rogers Blocker Primary Care Analis Distler: Aura Dials Other Clinician: Referring Kairi Tufo: Aura Dials Treating Andres Escandon/Extender: Rowan Blase in Treatment: 0 Vital Signs Time Taken: 10:35 Temperature (F): 98.1 Height (in): 60 Pulse (bpm): 92 Weight (lbs): 95 Respiratory Rate (breaths/min): 16 Source: Stated Blood Pressure (mmHg): 116/72 Body Mass Index (BMI): 18.6 Reference Range: 80 - 120 mg / dl Electronic Signature(s) Signed: 09/03/2020 2:10:08 PM By: Phillis Haggis, Dondra Prader Entered By: Phillis Haggis, Dondra Prader on 08/31/2020 10:39:34

## 2020-09-03 NOTE — Progress Notes (Signed)
[name], Kema W. ([id number]) Visit Report for 08/31/2020 Chief Complaint Document Details Patient Name: [name], [name] Date of Service: 08/31/2020 10:30 AM Medical Record Number: [id number] Patient Account Number: [id number] Date of Birth/Sex: Jan 05, 1923 (86 y.o. F) Treating RN: Huel CoventryWoody, Kim Primary Care Provider: Aura Dialsannady, [name] Other Clinician: Referring Provider: Aura Dialsannady, [name] Treating Provider/Extender: Rowan BlaseStone, Quinnie Barcelo Weeks in Treatment: 0 Information Obtained from: Patient Chief Complaint Left 2nd toe ulcer Electronic Signature(s) Signed: 08/31/2020 10:56:23 AM By: Lenda KelpStone III, Vernesha Talbot PA-C Entered By: Lenda KelpStone III, Frayda Egley on 08/31/2020 10:56:22 [name], [name] ([id number]) -------------------------------------------------------------------------------- Debridement Details Patient Name: [name], [name] Date of Service: 08/31/2020 10:30 AM Medical Record Number: [id number] Patient Account Number: [id number] Date of Birth/Sex: Jan 05, 1923 (86 y.o. F) Treating RN: Huel CoventryWoody, Kim Primary Care Provider: Aura Dialsannady, [name] Other Clinician: Referring Provider: Aura Dialsannady, [name] Treating Provider/Extender: Rowan BlaseStone, Mallory Enriques Weeks in Treatment: 0 Debridement Performed for Wound #14 Toe Second Assessment: Performed By: Physician Nelida MeuseStone, Kraig Genis E., PA-C Debridement Type: Debridement Level of Consciousness (Pre- Responds to Painful Stimuli procedure): Pre-procedure Verification/Time Out Yes - 11:30 Taken: Start Time: 11:30 Pain Control: Lidocaine 4% Topical Solution Total Area Debrided (L x W): 0.6 (cm) x 0.3 (cm) = 0.18 (cm) Tissue and other material Viable, Non-Viable, Bone, Slough, Subcutaneous, Slough debrided: Level: Skin/Subcutaneous Tissue/Muscle/Bone Debridement Description: Excisional Instrument: Forceps, Scissors Specimen: Tissue Culture Number of Specimens Taken: 1 Bleeding: Minimum Hemostasis Achieved: Pressure End Time: 11:35 Post Procedural Pain: 0 Response to Treatment: Procedure was  tolerated well Level of Consciousness (Post- Responds to Painful Stimuli procedure): Post Debridement Measurements of Total Wound Length: (cm) 0.6 Stage: Category/Stage IV Width: (cm) 0.7 Depth: (cm) 0.4 Volume: (cm) 0.132 Character of Wound/Ulcer Post Debridement: Requires Further Debridement Post Procedure Diagnosis Same as Pre-procedure Electronic Signature(s) Signed: 08/31/2020 12:34:26 PM By: Lenda KelpStone III, Mairany Bruno PA-C Signed: 08/31/2020 4:55:43 PM By: Elliot GurneyWoody, BSN, RN, CWS, Kim RN, BSN Entered By: Lenda KelpStone III, Alithea Lapage on 08/31/2020 12:34:26 [name], [name] ([id number]) -------------------------------------------------------------------------------- HPI Details Patient Name: [name], Shakiya W. Date of Service: 08/31/2020 10:30 AM Medical Record Number: [id number] Patient Account Number: [id number] Date of Birth/Sex: Jan 05, 1923 (86 y.o. F) Treating RN: Huel CoventryWoody, Kim Primary Care Provider: Aura Dialsannady, [name] Other Clinician: Referring Provider: Aura Dialsannady, [name] Treating Provider/Extender: Rowan BlaseStone, Marwin Primmer Weeks in Treatment: 0 History of Present Illness HPI Description: 01/07/17 this is a 86 year old woman admitted to the clinic today for review of a pressure ulcer on her lower sacrum. She is referred from her primary physician's office after being seen on 3/22 with a 3 cm pressure area. Her daughter and caretaker accompanied her today state that the area first became obvious about a month ago and his since deteriorated. They have recently got [name] home health involved and have been using [name] to the wound. They have ordered a pressure relief surface for her mattress. They are turning her religiously. They state that she eats well and they've been forcing fluids on her. She is on a multivitamin. [facility] point last albumin I see was 4.4 on 10/28. The patient has advanced parkinsonism which looks superficially like advanced Parkinson's disease although her daughter tells me she did  not ever respond to Sinemet therefore this may have another pathology with signs of parkinsonism. However I think this is largely a mute point currently. She also has dementia and is nonambulatory. Since this started they have been keeping her in bed and turning her religiously every 2 hours. [place] at [place] in [place] with her husband with 24/7 care giving 01/13/17 santyl change qd. Still  will require further debridement. continue santyl. 01/20/17; patient's wound actually looks some better less adherent necrotic surface. There is actually visible granulation. We're using [name] 01/27/17; better-looking surface but still a lot of necrotic tissue on the base of this wound. The periwound erythema is better than last week we are still using [name]. Her daughter tells [place] that she is still having trouble with the pressure-relief mattress through medical modalities 02/03/18; I had the patient scheduled for a two week followup however her daughter brought her in early concerned for discoloration on 2 areas of the wound circumference. We have bee using santyl 02/10/17;Better looking surface to the wound. Rim appears better suggesting better offloading. Using santyl 02/24/17; change to Silver [place] last time. Wound appears better. 03/10/17; still using silver collagen religious offloading. Intake is satisfactory per her daughter. Dimension slightly better 03/12/2017 -- Dr. Jannetta Quint patient who had been seen 2 days ago and was doing fairly well. The patient is brought in by her daughter who noticed a new wound just above the previous wound on her sacral area and going on more to the left lateral side. She was very concerned and we asked her to get in for an opinion. 03/17/17; above is noted. The patient has developed a progressive area to the left of her original wound. This seems to this started with a ring of red skin with a more pale interior almost looking fungal. There was a rim of blister through part of  the area although this did not look like zoster. They have been applying triamcinolone that was prescribed last week by Dr. Meyer Russel and the area has a fold to a linear band area which is confluent, erythematous and with obvious epidermal swelling but there is no overt tenderness or crepitus. She has lost some surface epithelium closer to the wound surface itself and now has a more superficial wound in this area and the extending erythema goes towards the left buttock. This is well demarcated between involved in normal skin but once again does not appear to be at all tender. If there is a contact issue here I cannot get the history out of the daughter or the caregiver that are with her. 03/24/17; the patient arrives today with the wound slightly worse slightly more drainage. The bandlike area of erythema that I treated as a possible pineal infection has improved somewhat although proximally is still has confluent erythema without overt tenderness. We have been using silver alginate since the most recent deterioration. To the bandlike degree of erythema we have been using Lotrisone cream 03/31/17; patient arrives today with the wound slightly larger, necrotic surface and surrounding erythema. The bandlike area of erythema that I treated as a possible pineal infection is less swollen but still present I've been using Lotrisone cream on that largely related to the presence of a tinea looking infection when this was first seen. We've been using silver alginate. X-ray that I ordered last week showed no acute bony abnormalities mild fecal impaction. Lab work showed a comprehensive metabolic panel that was normal including an albumin of 3.8. White count was 9.5 hemoglobin 12.3 differential count normal. C-reactive protein was less than 1 and sedimentation rate and 17. The latter 2 values does not support an ongoing bacterial infection. 04/14/17; patient arrives after a 2 week hiatus. Her wound is not in good  condition. Although the base of the wound looks stable she still has an erythematous area that was apparently blistered over the weekend. This again points to  the left. As our intake nurse pointed out today this is in the area where the tissue folds together and we may need to prevent try to prevent this. Lab work and x-ray that I did to 3 weeks ago were unremarkable including her albumin nevertheless she is an extremely frail condition physically. We have have been using [name] 04/22/17; I changed her to silver alginate because of the surrounding maceration and moisture last week. The daughter did not like the way the wound looked in the middle of the week and changed her back to [place]. They're putting gauze on top of this. Thinks still using Lotrisone. 05/06/17 on evaluation today patient sacral wound appears to be doing okay and does not seem to be any worse. She is having no significant pain during evaluation today the secondary to mental status she is unable to rate or describe whether she had any pain she was not however flinching. Her daughter states that the wound does appear to be looking better to her. Still we are having difficulty with the skinfold that seems to be closing in on itself at this point. All in all I feel like she is making some good progress in the [name] seems to be the official for her. They do tell me that a refill if we are gonna continue that today. No fevers, chills, nausea, or vomiting noted at this time. 05/13/17 presents today for evaluation concerning her ongoing sacral pressure ulcer. Unfortunately she also has an area of deep tissue injury in the right Ischial region which is starting to show up. The sacral wound also continues to show signs of necrotic tissue overlying and has declined. Overall we really have not seen a significant improvement in the past several months in regard to the sacral wound and now patient is starting to develop a new wound in the right  Ischial region. Obviously this is not trending in the direction that we want to see. No fevers, chills, nausea, or vomiting noted at this time. 05/19/17; I have not seen this wound and almost a month however there is nothing really positive to say about it. Necrotic tissue over the surface which superiorly I think abuts on her sacrum. She has surrounding erythema. I would be surprised if there is not underlying osteomyelitis or soft tissue infection. 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 She apparently eats well per description although I wonder about this looking at her. Lab work I did probably 4 weeks ago however was really quite normal including a serum albumin 05/26/17; culture I did last week grew Escherichia coli and methicillin sensitive staph aureus which should've been well covered by the Augmentin and ciprofloxacin. Indeed the erythema around the wound in the bed of the wound looks somewhat better. Her intake is still satisfactory. They now have a near fluidized bed 06/02/17; they completed the antibiotics last Friday. Using collagen. Daughter still reports eating and drinking well. There is less visualized SHYNIA, DALEO. ([id number]) erythema around the wound. 06/16/17; large pressure ulcer over her lower sacrum and coccyx. Using [name] to the wound bed. 06/30/17; certainly no change in dimensions of this large stage III wound over her sacrum and coccyx. They've been using [name]. There is no exposed bone. She has a candidal/tinea area in the right inguinal area. Other than that her daughter relates that she is eating and drinking well there changing her positioning to make sure the areas offloaded 07/14/17; no major change in the dimensions of this large  stage III wound. Initially a smaller wound that became secondarily infected causing significant tissue breakdown although it is been stable in the last several weeks. Tunneling superiorly at roughly 1:00 no change here  either. There is no bone palpable. Both the patient's daughter and caretaker states that she eats well. I have not rechecked her blood work 07/27/17; patient arrives in clinic today and generally a deteriorated looking state. Mild fever with axillary temperature of 100.4. Daughter reports she has not been eating and drinking well since yesterday. She looks more pale and thin and less responsive. We have been using silver collagen to her wound 08/11/17; since the last time the patient was here things have gone better. Her fever went down and she started eating and drinking again. Culture I did of the wound showed methicillin sensitive staph aureus, Morganella and enterococcus. I only treated her with Keflex which would've not covered the Morganella and enterococcus however the purulent area on the 2:00 side of her wound is a lot better and the bandlike erythema that concern me also was resolved. I'd called the daughter last week to confirm that she was a lot better. She finished the Keflex last Thursday she also suffered a skin tear this morning perhaps while putting on her incontinence brief period is on the lateral aspect of her right leg. Clean wound with the surface epithelium not viable. 08/25/17; last week the patient was noted to have erythema around the wound margin and a slight fever which the patient's daughter says was 37. Our office was contacted by home health however we did not have a space to work the patient in that she went to see her primary physician Dr. Maurice March. She was not febrile during this visit on 08/21/17 there was erythema around the wound similar to last occasion. Dr. Maurice March in reference to my culture from 07/28/17 and put her on doxycycline capsules which they're opening twice a day for 10 days. 09/17/17; patient arrives today with the wound bed looking fairly well granulated. There is undermining from 4 to 6:00 although this seems to of contracted slightly. She does not have  obvious infection although the daughter states there was some darkening of the wound circumference that is not evident today. They state she is eating well. They are concerned about oral thrush 09/30/16; very fibrin looking granulated wound bed. Her undermining from 4 to 6:00 is about the same but also appears to be well granulated. She has rolled edges of senescent tissue from roughly 7 to 12:00. There is no evidence of infection 10/13/17 on evaluation today patient appears to be doing fairly well all things considered in regard to her sacral wound. There's really not a lot of significant change or improvement she does have some evidence of contusion and deep tissue injury around the left border of the wound that patient's daughter did inquire about today. Nonetheless overall the wound appears to be doing about the same in my opinion. There is no significant indication of infection there also is no significant slough noted at this point. 10/27/17 She is here in follow up evaluation of a sacral ulcer. She is accompanied by her daughter and caregiver. There is red granulation tissue throughout, persistent discoloration to left border; this appears consistent with deep tissue injury. There are multiple areas covered in foam borders, tegaderm, etc that are "preventative" with "no wounds". We will continue with prisma and continue with two week follow ups 11/17/17 on evaluation today patient appears to be doing decently  well in regard to her wound at this point. She continues to have a sacral wound ulcer. We see her roughly every two weeks. In the last week she was actually in the hospital due to what was diagnosed as sepsis although no organisms were ever identified in the actual blood cultures. The hospital really was not sure that the wound was the cause of the infection but they really did not figure out anything else that would be a causative organism she did have a CT scan and that revealed no evidence  of pneumonia there was also no evidence of urinary tract infection. Again it very well could have been the wound called in those although wound appears to be doing fairly well at this point. 12/15/17 on evaluation today patient actually appears to be doing about the same in regard to her sacral ulcer. The one thing different is that she does seem to have a rash where her right arm is contracted and being held to the thorax. The area underlying both on the ventral side of the arm as well is the thorax where it comes in contact shows evidence of a rash which appears to be fungal in nature. There does not appear to be any evidence of infection lies at this point. There does not appear to be a rash consistent with shingles which was also of concern initially. No fevers, chills, nausea, or vomiting noted at this time. 12/29/17 on evaluation today patient appears to be doing a little better in regard to her sacral wound although the one changes the 12 o'clock location of the wound seems to have attached at one point which no longer allows this to pull back. This has caused an area of undermining that seems to be attaching as well in the 12 o'clock location. I do think that this is something that can be managed and is not necessarily a bad thing. Nonetheless she does seem to have some pain with exploration of this region of undermining. She continues to have an area under her right arm on the chest wall of erythema although this is a little better especially after the home health nurse is actually got in order for Diflucan times one for the patient. She is a new area on the left wrist that looks like because of the contraction her nail may have pushed in on her wrist area causing a slight cut which subsequently became infected. She has some honey crusted drainage noted. 03/09/18 undervaluation today patient appears to be doing fairly well in regard to her wounds in general. She has been tolerating the  dressing changes without complication. Overall I'm pleased with the progress that seems to be made week by week especially in regard to the sacral ulcer. Her daughter and the caregiver seem to take very good care of her. With that being said she has very little undermining in regard to the sacral wound in good granulation she also has good epithelialization noted. 03/23/18 on evaluation today patient appears to be doing rather well in regard to her sacral wound. Unfortunately she does have a little bit of necrotic tissue noted in the central portion of her wound on the left elbow. This is due to the fact that she is lying on this arm seeing how it is contracted. Currently her daughter has started to avoid lying on the side it all due to the fact that the necrotic tissue was noted. With that being said they have been taking very good care of  her in my pinion. Fortunately there does not appear to be any evidence of infection which is good news. Overall I'm pleased with the progress she's made other than in regard to the elbow. 04/06/18 on evaluation today patient actually appears to be doing okay in regard to the sacral wound. In fact it appears to be somewhat smaller in general although there does appear to be more undermining at the 6 o'clock location than was previously noted. She has been tolerating the dressing changes without complication in general which is also good news. Nonetheless she does have a new small skin tear/opening on her leg which was evaluated today. Fortunately this appears to be minimal and the Xeroform gauze which has been used up to this point in the past has been utilized very effectively seems to be doing well in that regard. Her left elbow ulcer seems to also be doing excellent at this point making good progress. 04/20/18 on evaluation today patient appears to be doing fairly well in regard to her sacral wound. This definitely seems to be better than during last evaluation  where it was indeed infected. With that being said she has been tolerating the dressing changes as best it can be expected. Her elbow seems to be drying out and a lot of times the dressing is getting stuck according to her caregiver. [name], LEMING ([id number]) 05/04/18-She is seen in follow-up evaluation for a sacral and left elbow pressure ulcer. These are stable/improved and we will continue with same treatment plan and she will follow-up in 2 weeks 05/18/18 on evaluation today patient actually appears to be doing better in regard to her left elbow ulcer. The sacral wound is also shown signs of improvement which is good news. With that being said she does have a new right medial lower extremity ulcer which actually appears to show some signs of cellulitis/infection. She is previously taken Augmentin with good result. Nonetheless the patient really does not have anything that I can culture at the site there's actually some good epithelialization noted although again there is some cellulitis appearance as well. 06/01/18 on evaluation today patient appears to actually be doing very well in general in regard to her left elbow wound which is smaller in her lower extremity ulcer which is actually healed. With that being said the sacral wound in particular show signs of improvement although she still has undermining in the six-7 o'clock location. No fevers, chills, nausea, or vomiting noted at this time. 06/15/18 on evaluation today patient actually appears to be doing excellent in regard to her elbow ulcer on the left elbow. This is shown signs of great improvement and I do feel like she is very close to healing in this regard. In general her sacral wound also appears to be doing well there's good sign of improvement there as well as far as the overall surface of the wound is concerned. She does have a significant area of tunneling at the 6 o'clock location that still is about the same really there's no  significant improvement in that regard. Nonetheless we are attempting to try and treat this by way of packing with Prisma followed by silver so to help control moisture. The patient been tolerating the dressing changes without complication. 06/29/18 on evaluation today patient actually appears to be doing very well in regard to her left elbow ulcer and her sacral ulcer. She has been tolerating the dressing changes without complication which is good news. In fact the sacral wound appears to  have almost completely closed although she does have an area of undermining/tunneling at roughly the 7 o'clock location that is still unfortunately having a more difficult time closing although as opposed to during the last visit I was unable to get a normal Q-tip down into the undermined region. Actually had to use a skinny probe or else the backside of the Q-tip were just a wooden stake was. I think this is a good sign that this area seems to be filling in which is wonderful. Overall I'm pleased with how things are progressing. 07/13/18 on evaluation today patient appears to be doing a little worse in regard to the overall appearance of the sacral wound. She has been tolerating the dressing changes without complication. With that being said the patient's daughter tells me that the home health nurse actually dug around it significantly in the 12 o'clock location of the wound and subsequently reopen this area which was very well healed previous. This was a new nurse that hasn't generally been coming out to see the patient. Nonetheless they are very upset about this and the fact that the wound was actually worse when she was done "taking". It does appear that the wound is recovering and in fact since that point has closed quite significantly compared to what they tell me it looked like when she was done. 07/27/18 evaluation today patient appears to be doing well in regard to her elbow ulcer as well as the sacral  ulcer. She has actually been doing excellent at this point in time. Fortunately there does not appear to be any evidence of infection currently. Overall I'm very happy with the progress at this time. She does have a couple other areas where there was some redness noted a fortunately there does not appear to be the evidence of more significant skin breakdown although there just slightly blanchable regions that I think do need to be watched carefully. 08/10/18 upon evaluation today patient actually appears to be showing signs of improvement at all sites evaluated today. This is even true in the sacral region where she has less expensive undermining although there is still undermining noted unfortunately. They're having a very hard time being able to pack anything into this area. She may benefit from utilizing gentamicin cream and just a cover dressing. 08/24/18 on evaluation today patient sacral ulcer actually appears to be almost completely healed. She has done extremely well in this regard and I think this is in most part due to her family who has been excellent in taking care of her as well as her caregiver. Overall I'm extremely pleased in this regard. Her left shoulder area did open up since the last visit where I saw her although this was something that we potentially expected. Fortunately it does not appear to show any signs of significant infection overall I think that an alginate dressing would be very well at the site. 09/07/18 on evaluation today the patient actually appears to be completely healed in regard to all of her wounds. There is currently nothing remaining open at this point. She has been tolerating the dressing changes without complication. Overall I'm very pleased with how things have progressed. Patient's daughter is also extremely happy. 09/20/18 on evaluation today patient actually appears to be completely healed on evaluation today. This was true at the last visit as well but  we wanted to continue to monitor in order to ensure that there was nothing that reopened or worsens in the interim. Everything appears to be  doing great. Readmission: 08/31/2020 upon evaluation today patient presents for reevaluation here in our clinic. She is here actually for a different issue from what I saw her for previous. She currently is experiencing issues with a wound on her second toe left foot. This showed up right around Thanksgiving initially was just red that opened up into an ulceration. She has an abscess on the dorsal aspect of her toe which is not open at this point. With that being said her daughter is concerned about osteomyelitis. Currently the patient is on palliative care but not hospice care. She has been placed on Keflex which actually ended Sunday night. That does seem to have help with erythema according to the daughter. Nonetheless no cultures were obtained initially I think that would be a good idea. There also appears to be some necrotic debris/bone that I see easily accessible that I am going to remove and sent for pathology in order to identify whether or not she has osteomyelitis. I am also going to suggest that we likely order an x-ray today that was all discussed with the patient's daughter. Electronic Signature(s) Signed: 08/31/2020 1:45:40 PM By: Lenda Kelp PA-C Entered By: Lenda Kelp on 08/31/2020 13:45:39 [name] ([id number]) -------------------------------------------------------------------------------- Physical Exam Details Patient Name: [name] Date of Service: 08/31/2020 10:30 AM Medical Record Number: [id number] Patient Account Number: [id number] Date of Birth/Sex: May 21, 1923 (86 y.o. F) Treating RN: Huel Coventry Primary Care Provider: Aura Dials Other Clinician: Referring Provider: Aura Dials Treating Provider/Extender: Rowan Blase in Treatment: 0 Constitutional supine blood pressure is within target  range for patient.Marland Kitchen respirations regular, non-labored and within target range for patient.Marland Kitchen temperature within target range for patient.. Well-nourished and well-hydrated in no acute distress. Eyes conjunctiva clear no eyelid edema noted. pupils equal round and reactive to light and accommodation. Ears, Nose, Mouth, and Throat no gross abnormality of ear auricles or external auditory canals. mucus membranes moist. Respiratory normal breathing without difficulty. clear to auscultation bilaterally. Cardiovascular 1+ dorsalis pedis/posterior tibialis pulses. no clubbing, cyanosis, significant edema, <3 sec cap refill. Musculoskeletal Patient unable to walk. Patient does appear to be contracted and again this prohibits along with her mental status and Parkinson's from her been able to ambulate. Psychiatric this patient is able to make decisions and demonstrates good insight into disease process. Alert and Oriented x 3. pleasant and cooperative. Notes Upon inspection patient's wound actually does show signs of being open and quite deep in regard to the toe. This does appear to be a stage IV pressure ulcer and actually did remove some of the what appears to be necrotic bone and soft tissue to send for pathology to evaluate for osteomyelitis. With that being said I do not see any evidence of worsening infection in fact this seems to be getting better according to what the daughter tells me today. I will send the tissue for pathology. Also obtain a wound culture today. Subsequently I am getting send her for an x-ray as well. Electronic Signature(s) Signed: 08/31/2020 1:47:09 PM By: Lenda Kelp PA-C Entered By: Lenda Kelp on 08/31/2020 13:47:08 [name] ([id number]) -------------------------------------------------------------------------------- Physician Orders Details Patient Name: [name] Date of Service: 08/31/2020 10:30 AM Medical Record Number: [id number] Patient  Account Number: [id number] Date of Birth/Sex: Apr 10, 1923 (86 y.o. F) Treating RN: Rogers Blocker Primary Care Provider: Aura Dials Other Clinician: Referring Provider: Aura Dials Treating Provider/Extender: Rowan Blase in Treatment: 0 Verbal / Phone Orders: No Diagnosis Coding ICD-10  Coding Code Description L89.893 Pressure ulcer of other site, stage 3 G20 Parkinson's disease Wound Cleansing Wound #14 Toe Second o Clean wound with Normal Saline. o Cleanse wound with mild soap and water Anesthetic (add to Medication List) Wound #14 Toe Second o Topical Lidocaine 4% cream applied to wound bed prior to debridement (In Clinic Only). Skin Barriers/Peri-Wound Care Wound #14 Toe Second o Moisturizing lotion Primary Wound Dressing Wound #14 Toe Second o Silver Alginate Secondary Dressing Wound #14 Toe Second o Gauze and Kerlix/Conform Dressing Change Frequency Wound #14 Toe Second o Change dressing every day. Follow-up Appointments Wound #14 Toe Second o Return Appointment in 1 week. Laboratory o Bacteria identified in Wound by Culture (MICRO) oooo LOINC Code: [phone] oooo Convenience Name: Wound culture routine o Tissue Pathology biopsy report (PATH) oooo LOINC Code: [id number] oooo Convenience Name: Tiss Path Bx report Radiology o X-ray, foot - wound on 2nd toe, questionable osteomyelitis - (ICD10 J81.191 - Pressure ulcer of other site, stage 3) [name], GROLL. ([id number]) Patient Medications Allergies: Phenergan, Sulfa (Sulfonamide Antibiotics) Notifications Medication Indication Start End Keflex 09/03/2020 DOSE 1 - oral 500 mg capsule - 1 capsule oral taken every 6 hours (4 times per day) for 30 days Electronic Signature(s) Signed: 09/03/2020 1:18:13 PM By: Lenda Kelp PA-C Previous Signature: 08/31/2020 3:21:38 PM Version By: Lenda Kelp PA-C Entered By: Lenda Kelp on 09/03/2020 13:18:12 [name]  ([id number]) -------------------------------------------------------------------------------- Problem List Details Patient Name: [name] Date of Service: 08/31/2020 10:30 AM Medical Record Number: [id number] Patient Account Number: [id number] Date of Birth/Sex: [birth year] (86 y.o. F) Treating RN: Huel Coventry Primary Care Provider: Aura Dials Other Clinician: Referring Provider: Aura Dials Treating Provider/Extender: Rowan Blase in Treatment: 0 Active Problems ICD-10 Encounter Code Description Active Date MDM Diagnosis L89.893 Pressure ulcer of other site, stage 3 08/31/2020 No Yes G20 Parkinson's disease 08/31/2020 No Yes Inactive Problems Resolved Problems Electronic Signature(s) Signed: 08/31/2020 10:56:06 AM By: Lenda Kelp PA-C Entered By: Lenda Kelp on 08/31/2020 10:56:06 [name] ([id number]) -------------------------------------------------------------------------------- Progress Note Details Patient Name: [name] Date of Service: 08/31/2020 10:30 AM Medical Record Number: [id number] Patient Account Number: [id number] Date of Birth/Sex: May 30, 1923 (86 y.o. F) Treating RN: Huel Coventry Primary Care Provider: Aura Dials Other Clinician: Referring Provider: Aura Dials Treating Provider/Extender: Rowan Blase in Treatment: 0 Subjective Chief Complaint Information obtained from Patient Left 2nd toe ulcer History of Present Illness (HPI) 01/07/17 this is a 86 year old woman admitted to the clinic today for review of a pressure ulcer on her lower sacrum. She is referred from her primary physician's office after being seen on 3/22 with a 3 cm pressure area. Her daughter and caretaker accompanied her today state that the area first became obvious about a month ago and his since deteriorated. They have recently got [name] home health involved and have been using [name] to the wound. They have ordered a pressure relief  surface for her mattress. They are turning her religiously. They state that she eats well and they've been forcing fluids on her. She is on a multivitamin. Looking through [facility] point last albumin I see was 4.4 on 10/28. The patient has advanced parkinsonism which looks superficially like advanced Parkinson's disease although her daughter tells me she did not ever respond to Sinemet therefore this may have another pathology with signs of parkinsonism. However I think this is largely a mute point currently. She also has dementia and is nonambulatory. Since this  started they have been keeping her in bed and turning her religiously every 2 hours. She lives at home in [place] with her husband with 24/7 care giving 01/13/17 santyl change qd. Still will require further debridement. continue santyl. 01/20/17; patient's wound actually looks some better less adherent necrotic surface. There is actually visible granulation. We're using [name] 01/27/17; better-looking surface but still a lot of necrotic tissue on the base of this wound. The periwound erythema is better than last week we are still using [name]. Her daughter tells [place] that she is still having trouble with the pressure-relief mattress through medical modalities 02/03/18; I had the patient scheduled for a two week followup however her daughter brought her in early concerned for discoloration on 2 areas of the wound circumference. We have bee using santyl 02/10/17;Better looking surface to the wound. Rim appears better suggesting better offloading. Using santyl 02/24/17; change to Silver Colle[place] last time. Wound appears better. 03/10/17; still using silver collagen religious offloading. Intake is satisfactory per her daughter. Dimension slightly better 03/12/2017 -- Dr. Jannetta Quint patient who had been seen 2 days ago and was doing fairly well. The patient is brought in by her daughter who noticed a new wound just above the previous wound on her sacral  area and going on more to the left lateral side. She was very concerned and we asked her to get in for an opinion. 03/17/17; above is noted. The patient has developed a progressive area to the left of her original wound. This seems to this started with a ring of red skin with a more pale interior almost looking fungal. There was a rim of blister through part of the area although this did not look like zoster. They have been applying triamcinolone that was prescribed last week by Dr. Meyer Russel and the area has a fold to a linear band area which is confluent, erythematous and with obvious epidermal swelling but there is no overt tenderness or crepitus. She has lost some surface epithelium closer to the wound surface itself and now has a more superficial wound in this area and the extending erythema goes towards the left buttock. This is well demarcated between involved in normal skin but once again does not appear to be at all tender. If there is a contact issue here I cannot get the history out of the daughter or the caregiver that are with her. 03/24/17; the patient arrives today with the wound slightly worse slightly more drainage. The bandlike area of erythema that I treated as a possible pineal infection has improved somewhat although proximally is still has confluent erythema without overt tenderness. We have been using silver alginate since the most recent deterioration. To the bandlike degree of erythema we have been using Lotrisone cream 03/31/17; patient arrives today with the wound slightly larger, necrotic surface and surrounding erythema. The bandlike area of erythema that I treated as a possible pineal infection is less swollen but still present I've been using Lotrisone cream on that largely related to the presence of a tinea looking infection when this was first seen. We've been using silver alginate. X-ray that I ordered last week showed no acute bony abnormalities mild fecal impaction. Lab  work showed a comprehensive metabolic panel that was normal including an albumin of 3.8. White count was 9.5 hemoglobin 12.3 differential count normal. C-reactive protein was less than 1 and sedimentation rate and 17. The latter 2 values does not support an ongoing bacterial infection. 04/14/17; patient arrives after a 2 week  hiatus. Her wound is not in good condition. Although the base of the wound looks stable she still has an erythematous area that was apparently blistered over the weekend. This again points to the left. As our intake nurse pointed out today this is in the area where the tissue folds together and we may need to prevent try to prevent this. Lab work and x-ray that I did to 3 weeks ago were unremarkable including her albumin nevertheless she is an extremely frail condition physically. We have have been using [name] 04/22/17; I changed her to silver alginate because of the surrounding maceration and moisture last week. The daughter did not like the way the wound looked in the middle of the week and changed her back to [place]. They're putting gauze on top of this. Thinks still using Lotrisone. 05/06/17 on evaluation today patient sacral wound appears to be doing okay and does not seem to be any worse. She is having no significant pain during evaluation today the secondary to mental status she is unable to rate or describe whether she had any pain she was not however flinching. Her daughter states that the wound does appear to be looking better to her. Still we are having difficulty with the skinfold that seems to be closing in on itself at this point. All in all I feel like she is making some good progress in the [name] seems to be the official for her. They do tell me that a refill if we are gonna continue that today. No fevers, chills, nausea, or vomiting noted at this time. 05/13/17 presents today for evaluation concerning her ongoing sacral pressure ulcer. Unfortunately she also has an  area of deep tissue injury in the right Ischial region which is starting to show up. The sacral wound also continues to show signs of necrotic tissue overlying and has declined. Overall we really have not seen a significant improvement in the past several months in regard to the sacral wound and now patient is starting to develop a new wound in the right Ischial region. Obviously this is not trending in the direction that we want to see. No fevers, chills, nausea, or vomiting noted at this time. 05/19/17; I have not seen this wound and almost a month however there is nothing really positive to say about it. Necrotic tissue over the surface which superiorly I think abuts on her sacrum. She has surrounding erythema. I would be surprised if there is not underlying osteomyelitis or soft tissue infection. 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 She apparently eats well per description although I wonder about this looking [name], ASBILL. ([id number]) at her. Lab work I did probably 4 weeks ago however was really quite normal including a serum albumin 05/26/17; culture I did last week grew Escherichia coli and methicillin sensitive staph aureus which should've been well covered by the Augmentin and ciprofloxacin. Indeed the erythema around the wound in the bed of the wound looks somewhat better. Her intake is still satisfactory. They now have a near fluidized bed 06/02/17; they completed the antibiotics last Friday. Using collagen. Daughter still reports eating and drinking well. There is less visualized erythema around the wound. 06/16/17; large pressure ulcer over her lower sacrum and coccyx. Using [name] to the wound bed. 06/30/17; certainly no change in dimensions of this large stage III wound over her sacrum and coccyx. They've been using [name]. There is no exposed bone. She has a candidal/tinea area in the right inguinal area.  Other than that her daughter relates that she is eating and  drinking well there changing her positioning to make sure the areas offloaded 07/14/17; no major change in the dimensions of this large stage III wound. Initially a smaller wound that became secondarily infected causing significant tissue breakdown although it is been stable in the last several weeks. Tunneling superiorly at roughly 1:00 no change here either. There is no bone palpable. Both the patient's daughter and caretaker states that she eats well. I have not rechecked her blood work 07/27/17; patient arrives in clinic today and generally a deteriorated looking state. Mild fever with axillary temperature of 100.4. Daughter reports she has not been eating and drinking well since yesterday. She looks more pale and thin and less responsive. We have been using silver collagen to her wound 08/11/17; since the last time the patient was here things have gone better. Her fever went down and she started eating and drinking again. Culture I did of the wound showed methicillin sensitive staph aureus, Morganella and enterococcus. I only treated her with Keflex which would've not covered the Morganella and enterococcus however the purulent area on the 2:00 side of her wound is a lot better and the bandlike erythema that concern me also was resolved. I'd called the daughter last week to confirm that she was a lot better. She finished the Keflex last Thursday she also suffered a skin tear this morning perhaps while putting on her incontinence brief period is on the lateral aspect of her right leg. Clean wound with the surface epithelium not viable. 08/25/17; last week the patient was noted to have erythema around the wound margin and a slight fever which the patient's daughter says was 97. Our office was contacted by home health however we did not have a space to work the patient in that she went to see her primary physician Dr. Maurice March. She was not febrile during this visit on 08/21/17 there was erythema around  the wound similar to last occasion. Dr. Maurice March in reference to my culture from 07/28/17 and put her on doxycycline capsules which they're opening twice a day for 10 days. 09/17/17; patient arrives today with the wound bed looking fairly well granulated. There is undermining from 4 to 6:00 although this seems to of contracted slightly. She does not have obvious infection although the daughter states there was some darkening of the wound circumference that is not evident today. They state she is eating well. They are concerned about oral thrush 09/30/16; very fibrin looking granulated wound bed. Her undermining from 4 to 6:00 is about the same but also appears to be well granulated. She has rolled edges of senescent tissue from roughly 7 to 12:00. There is no evidence of infection 10/13/17 on evaluation today patient appears to be doing fairly well all things considered in regard to her sacral wound. There's really not a lot of significant change or improvement she does have some evidence of contusion and deep tissue injury around the left border of the wound that patient's daughter did inquire about today. Nonetheless overall the wound appears to be doing about the same in my opinion. There is no significant indication of infection there also is no significant slough noted at this point. 10/27/17 She is here in follow up evaluation of a sacral ulcer. She is accompanied by her daughter and caregiver. There is red granulation tissue throughout, persistent discoloration to left border; this appears consistent with deep tissue injury. There are multiple areas covered  in foam borders, tegaderm, etc that are "preventative" with "no wounds". We will continue with prisma and continue with two week follow ups 11/17/17 on evaluation today patient appears to be doing decently well in regard to her wound at this point. She continues to have a sacral wound ulcer. We see her roughly every two weeks. In the last week she  was actually in the hospital due to what was diagnosed as sepsis although no organisms were ever identified in the actual blood cultures. The hospital really was not sure that the wound was the cause of the infection but they really did not figure out anything else that would be a causative organism she did have a CT scan and that revealed no evidence of pneumonia there was also no evidence of urinary tract infection. Again it very well could have been the wound called in those although wound appears to be doing fairly well at this point. 12/15/17 on evaluation today patient actually appears to be doing about the same in regard to her sacral ulcer. The one thing different is that she does seem to have a rash where her right arm is contracted and being held to the thorax. The area underlying both on the ventral side of the arm as well is the thorax where it comes in contact shows evidence of a rash which appears to be fungal in nature. There does not appear to be any evidence of infection lies at this point. There does not appear to be a rash consistent with shingles which was also of concern initially. No fevers, chills, nausea, or vomiting noted at this time. 12/29/17 on evaluation today patient appears to be doing a little better in regard to her sacral wound although the one changes the 12 o'clock location of the wound seems to have attached at one point which no longer allows this to pull back. This has caused an area of undermining that seems to be attaching as well in the 12 o'clock location. I do think that this is something that can be managed and is not necessarily a bad thing. Nonetheless she does seem to have some pain with exploration of this region of undermining. She continues to have an area under her right arm on the chest wall of erythema although this is a little better especially after the home health nurse is actually got in order for Diflucan times one for the patient. She is a new  area on the left wrist that looks like because of the contraction her nail may have pushed in on her wrist area causing a slight cut which subsequently became infected. She has some honey crusted drainage noted. 03/09/18 undervaluation today patient appears to be doing fairly well in regard to her wounds in general. She has been tolerating the dressing changes without complication. Overall I'm pleased with the progress that seems to be made week by week especially in regard to the sacral ulcer. Her daughter and the caregiver seem to take very good care of her. With that being said she has very little undermining in regard to the sacral wound in good granulation she also has good epithelialization noted. 03/23/18 on evaluation today patient appears to be doing rather well in regard to her sacral wound. Unfortunately she does have a little bit of necrotic tissue noted in the central portion of her wound on the left elbow. This is due to the fact that she is lying on this arm seeing how it is contracted. Currently her  daughter has started to avoid lying on the side it all due to the fact that the necrotic tissue was noted. With that being said they have been taking very good care of her in my pinion. Fortunately there does not appear to be any evidence of infection which is good news. Overall I'm pleased with the progress she's made other than in regard to the elbow. 04/06/18 on evaluation today patient actually appears to be doing okay in regard to the sacral wound. In fact it appears to be somewhat smaller in general although there does appear to be more undermining at the 6 o'clock location than was previously noted. She has been tolerating the dressing changes without complication in general which is also good news. Nonetheless she does have a new small skin tear/opening on her leg which was evaluated today. Fortunately this appears to be minimal and the Xeroform gauze which has been used up to this point  in the past has been utilized very effectively seems to be doing well in that regard. Her left elbow ulcer seems to also be doing excellent at this point making good [name], RICCI. ([id number]) progress. 04/20/18 on evaluation today patient appears to be doing fairly well in regard to her sacral wound. This definitely seems to be better than during last evaluation where it was indeed infected. With that being said she has been tolerating the dressing changes as best it can be expected. Her elbow seems to be drying out and a lot of times the dressing is getting stuck according to her caregiver. 05/04/18-She is seen in follow-up evaluation for a sacral and left elbow pressure ulcer. These are stable/improved and we will continue with same treatment plan and she will follow-up in 2 weeks 05/18/18 on evaluation today patient actually appears to be doing better in regard to her left elbow ulcer. The sacral wound is also shown signs of improvement which is good news. With that being said she does have a new right medial lower extremity ulcer which actually appears to show some signs of cellulitis/infection. She is previously taken Augmentin with good result. Nonetheless the patient really does not have anything that I can culture at the site there's actually some good epithelialization noted although again there is some cellulitis appearance as well. 06/01/18 on evaluation today patient appears to actually be doing very well in general in regard to her left elbow wound which is smaller in her lower extremity ulcer which is actually healed. With that being said the sacral wound in particular show signs of improvement although she still has undermining in the six-7 o'clock location. No fevers, chills, nausea, or vomiting noted at this time. 06/15/18 on evaluation today patient actually appears to be doing excellent in regard to her elbow ulcer on the left elbow. This is shown signs of great improvement and I do  feel like she is very close to healing in this regard. In general her sacral wound also appears to be doing well there's good sign of improvement there as well as far as the overall surface of the wound is concerned. She does have a significant area of tunneling at the 6 o'clock location that still is about the same really there's no significant improvement in that regard. Nonetheless we are attempting to try and treat this by way of packing with Prisma followed by silver so to help control moisture. The patient been tolerating the dressing changes without complication. 06/29/18 on evaluation today patient actually appears to be doing  very well in regard to her left elbow ulcer and her sacral ulcer. She has been tolerating the dressing changes without complication which is good news. In fact the sacral wound appears to have almost completely closed although she does have an area of undermining/tunneling at roughly the 7 o'clock location that is still unfortunately having a more difficult time closing although as opposed to during the last visit I was unable to get a normal Q-tip down into the undermined region. Actually had to use a skinny probe or else the backside of the Q-tip were just a wooden stake was. I think this is a good sign that this area seems to be filling in which is wonderful. Overall I'm pleased with how things are progressing. 07/13/18 on evaluation today patient appears to be doing a little worse in regard to the overall appearance of the sacral wound. She has been tolerating the dressing changes without complication. With that being said the patient's daughter tells me that the home health nurse actually dug around it significantly in the 12 o'clock location of the wound and subsequently reopen this area which was very well healed previous. This was a new nurse that hasn't generally been coming out to see the patient. Nonetheless they are very upset about this and the fact that the  wound was actually worse when she was done "taking". It does appear that the wound is recovering and in fact since that point has closed quite significantly compared to what they tell me it looked like when she was done. 07/27/18 evaluation today patient appears to be doing well in regard to her elbow ulcer as well as the sacral ulcer. She has actually been doing excellent at this point in time. Fortunately there does not appear to be any evidence of infection currently. Overall I'm very happy with the progress at this time. She does have a couple other areas where there was some redness noted a fortunately there does not appear to be the evidence of more significant skin breakdown although there just slightly blanchable regions that I think do need to be watched carefully. 08/10/18 upon evaluation today patient actually appears to be showing signs of improvement at all sites evaluated today. This is even true in the sacral region where she has less expensive undermining although there is still undermining noted unfortunately. They're having a very hard time being able to pack anything into this area. She may benefit from utilizing gentamicin cream and just a cover dressing. 08/24/18 on evaluation today patient sacral ulcer actually appears to be almost completely healed. She has done extremely well in this regard and I think this is in most part due to her family who has been excellent in taking care of her as well as her caregiver. Overall I'm extremely pleased in this regard. Her left shoulder area did open up since the last visit where I saw her although this was something that we potentially expected. Fortunately it does not appear to show any signs of significant infection overall I think that an alginate dressing would be very well at the site. 09/07/18 on evaluation today the patient actually appears to be completely healed in regard to all of her wounds. There is currently nothing remaining  open at this point. She has been tolerating the dressing changes without complication. Overall I'm very pleased with how things have progressed. Patient's daughter is also extremely happy. 09/20/18 on evaluation today patient actually appears to be completely healed on evaluation today. This was  true at the last visit as well but we wanted to continue to monitor in order to ensure that there was nothing that reopened or worsens in the interim. Everything appears to be doing great. Readmission: 08/31/2020 upon evaluation today patient presents for reevaluation here in our clinic. She is here actually for a different issue from what I saw her for previous. She currently is experiencing issues with a wound on her second toe left foot. This showed up right around Thanksgiving initially was just red that opened up into an ulceration. She has an abscess on the dorsal aspect of her toe which is not open at this point. With that being said her daughter is concerned about osteomyelitis. Currently the patient is on palliative care but not hospice care. She has been placed on Keflex which actually ended Sunday night. That does seem to have help with erythema according to the daughter. Nonetheless no cultures were obtained initially I think that would be a good idea. There also appears to be some necrotic debris/bone that I see easily accessible that I am going to remove and sent for pathology in order to identify whether or not she has osteomyelitis. I am also going to suggest that we likely order an x-ray today that was all discussed with the patient's daughter. Patient History Unable to Obtain Patient History due to Altered Mental Status. Allergies Phenergan, Sulfa (Sulfonamide Antibiotics) [name], BADIA ([id number]) Family History Heart Disease - Father,Siblings, No family history of Cancer, Diabetes, Hereditary Spherocytosis, Hypertension, Kidney Disease, Lung Disease, Seizures, Stroke, Thyroid  Problems, Tuberculosis. Social History Never smoker, Marital Status - Married, Alcohol Use - Never, Drug Use - No History, Caffeine Use - Never. Medical History Eyes Denies history of Cataracts, Glaucoma, Optic Neuritis Ear/Nose/Mouth/Throat Denies history of Chronic sinus problems/congestion, Middle ear problems Hematologic/Lymphatic Patient has history of Anemia Denies history of Hemophilia, Human Immunodeficiency Virus, Lymphedema, Sickle Cell Disease Respiratory Denies history of Aspiration, Asthma, Chronic Obstructive Pulmonary Disease (COPD), Pneumothorax, Sleep Apnea, Tuberculosis Cardiovascular Patient has history of Hypertension Denies history of Angina, Arrhythmia, Congestive Heart Failure, Coronary Artery Disease, Deep Vein Thrombosis, Hypotension, Myocardial Infarction, Peripheral Arterial Disease, Peripheral Venous Disease, Phlebitis, Vasculitis Gastrointestinal Denies history of Cirrhosis , Colitis, Crohn s, Hepatitis A, Hepatitis B, Hepatitis C Endocrine Denies history of Type I Diabetes, Type II Diabetes Genitourinary Denies history of End Stage Renal Disease Immunological Denies history of Lupus Erythematosus, Raynaud s, Scleroderma Integumentary (Skin) Denies history of History of Burn, History of pressure wounds Musculoskeletal Denies history of Gout, Rheumatoid Arthritis, Osteoarthritis, Osteomyelitis Neurologic Patient has history of Dementia Denies history of Neuropathy, Quadriplegia, Paraplegia, Seizure Disorder Oncologic Denies history of Received Chemotherapy, Received Radiation Psychiatric Denies history of Anorexia/bulimia, Confinement Anxiety Medical And Surgical History Notes Ear/Nose/Mouth/Throat dysphagia Neurologic parkinsons, tremor Objective Constitutional supine blood pressure is within target range for patient.Marland Kitchen respirations regular, non-labored and within target range for patient.Marland Kitchen temperature within target range for patient..  Well-nourished and well-hydrated in no acute distress. Vitals Time Taken: 10:35 AM, Height: 60 in, Weight: 95 lbs, Source: Stated, BMI: 18.6, Temperature: 98.1 F, Pulse: 92 bpm, Respiratory Rate: 16 breaths/min, Blood Pressure: 116/72 mmHg. Eyes conjunctiva clear no eyelid edema noted. pupils equal round and reactive to light and accommodation. Ears, Nose, Mouth, and Throat no gross abnormality of ear auricles or external auditory canals. mucus membranes moist. Respiratory normal breathing without difficulty. clear to auscultation bilaterally. Cardiovascular 1+ dorsalis pedis/posterior tibialis pulses. no clubbing, cyanosis, significant edema, [name], ARNTZ. ([id number]) Musculoskeletal Patient unable to  walk. Patient does appear to be contracted and again this prohibits along with her mental status and Parkinson's from her been able to ambulate. Psychiatric this patient is able to make decisions and demonstrates good insight into disease process. Alert and Oriented x 3. pleasant and cooperative. General Notes: Upon inspection patient's wound actually does show signs of being open and quite deep in regard to the toe. This does appear to be a stage IV pressure ulcer and actually did remove some of the what appears to be necrotic bone and soft tissue to send for pathology to evaluate for osteomyelitis. With that being said I do not see any evidence of worsening infection in fact this seems to be getting better according to what the daughter tells me today. I will send the tissue for pathology. Also obtain a wound culture today. Subsequently I am getting send her for an x-ray as well. Integumentary (Hair, Skin) Wound #14 status is Open. Original cause of wound was Pressure Injury. The wound is located on the Toe Second. The wound measures 0.6cm length x 0.7cm width x 0.3cm depth; 0.33cm^2 area and 0.099cm^3 volume. There is bone, tendon, and Fat Layer (Subcutaneous Tissue) exposed. There is  no tunneling or undermining noted. There is a medium amount of serosanguineous drainage noted. The wound margin is distinct with the outline attached to the wound base. There is medium (34-66%) red granulation within the wound bed. There is a small (1-33%) amount of necrotic tissue within the wound bed including Adherent Slough. Assessment Active Problems ICD-10 Pressure ulcer of other site, stage 3 Parkinson's disease Procedures Wound #14 Pre-procedure diagnosis of Wound #14 is a Pressure Ulcer located on the Toe Second . There was a Excisional Skin/Subcutaneous Tissue/Muscle/Bone Debridement with a total area of 0.18 sq cm performed by Nelida Meuse., PA-C. With the following instrument(s): Forceps, and Scissors to remove Viable and Non-Viable tissue/material. Material removed includes Bone,Subcutaneous Tissue, and Slough after achieving pain control using Lidocaine 4% Topical Solution. 1 specimen was taken by a Tissue Culture and sent to the lab per facility protocol. A time out was conducted at 11:30, prior to the start of the procedure. A Minimum amount of bleeding was controlled with Pressure. The procedure was tolerated well. The patient had a pain level of 0 following the procedure. Post Debridement Measurements: 0.6cm length x 0.7cm width x 0.4cm depth; 0.132cm^3 volume. Post debridement Stage noted as Category/Stage IV. Character of Wound/Ulcer Post Debridement requires further debridement. Post procedure Diagnosis Wound #14: Same as Pre-Procedure Plan Wound Cleansing: Wound #14 Toe Second: Clean wound with Normal Saline. Cleanse wound with mild soap and water Anesthetic (add to Medication List): Wound #14 Toe Second: Topical Lidocaine 4% cream applied to wound bed prior to debridement (In Clinic Only). Skin Barriers/Peri-Wound Care: Wound #14 Toe Second: Moisturizing lotion Primary Wound Dressing: Wound #14 Toe Second: Silver Alginate Secondary Dressing: Wound #14 Toe  Second: Gauze and Kerlix/Conform Dressing Change Frequency: Wound #14 Toe SecondDARNEISHA, WINDHORST ([id number]) Change dressing every day. Follow-up Appointments: Wound #14 Toe Second: Return Appointment in 1 week. Laboratory ordered were: Wound culture routine, Tiss Path Bx report Radiology ordered were: X-ray, foot - wound on 2nd toe, questionable osteomyelitis The following medication(s) was prescribed: Keflex oral 500 mg capsule 1 1 capsule oral taken every 6 hours (4 times per day) for 30 days starting 09/03/2020 1. Would recommend currently that we go ahead and initiate treatment with a silver alginate dressing. We will however use a plain alginate  today as she is going for x-ray. 2. I am get a order as well that we just roll this with con form/roll gauze in order to secure in place I think that does well that is generally what the patient's daughter does as well. 3. I am going to go ahead and send her for an x-ray to evaluate for any signs of osteomyelitis. 4. I am also sending what appears to be a piece of bone for pathology to evaluate for osteomyelitis. 5. I did obtain a deep wound culture that I am sending for evaluation in order to identify what bacteria we may be dealing with. I will need to likely give her additional antibiotics at the beginning of next week but I want to see what the results of the culture are if at all possible first. We will see patient back for reevaluation in 1 week here in the clinic. If anything worsens or changes patient will contact our office for additional recommendations. 09/03/2020 I did get the results back from the patient's x-ray, culture, and pathology today. I did contact the patient's daughter in order to go through these results with her and discuss treatment options. It appears the patient does have a pathologic fracture of the second toe where the wound is. This is in the proximal phalanx. Subsequently the pathology did show signs of acute  osteomyelitis and the culture appears to show staph although the final sensitivities are not back its appearing that the patient is responding very well to Keflex so I am doubtful that this represents MRSA. Nonetheless my opinion currently that I discussed with the patient's daughter was to go ahead and give her a prescription for Keflex to continue for the next 2 months I do not feel like the referral to infectious disease is really the best option at this point as I feel like it may be too much to try to put on the patient for IV antibiotic therapy and otherwise. Also think she is really not an excellent candidate for amputation of the toe which may be considered in a healthier patient but nonetheless at this point I think that we need to follow the more conservative pathway and see what we can do here. She is in agreement with the plan. I will see her on Friday Electronic Signature(s) Signed: 09/03/2020 1:53:15 PM By: Lenda Kelp PA-C Previous Signature: 08/31/2020 1:48:03 PM Version By: Lenda Kelp PA-C Entered By: Lenda Kelp on 09/03/2020 13:53:14 [name] ([id number]) -------------------------------------------------------------------------------- ROS/PFSH Details Patient Name: [name] Date of Service: 08/31/2020 10:30 AM Medical Record Number: [id number] Patient Account Number: [id number] Date of Birth/Sex: [birth year] (86 y.o. F) Treating RN: Rogers Blocker Primary Care Provider: Aura Dials Other Clinician: Referring Provider: Aura Dials Treating Provider/Extender: Rowan Blase in Treatment: 0 Unable to Obtain Patient History due to oo Altered Mental Status Eyes Medical History: Negative for: Cataracts; Glaucoma; Optic Neuritis Ear/Nose/Mouth/Throat Medical History: Negative for: Chronic sinus problems/congestion; Middle ear problems Past Medical History Notes: dysphagia Hematologic/Lymphatic Medical History: Positive for:  Anemia Negative for: Hemophilia; Human Immunodeficiency Virus; Lymphedema; Sickle Cell Disease Respiratory Medical History: Negative for: Aspiration; Asthma; Chronic Obstructive Pulmonary Disease (COPD); Pneumothorax; Sleep Apnea; Tuberculosis Cardiovascular Medical History: Positive for: Hypertension Negative for: Angina; Arrhythmia; Congestive Heart Failure; Coronary Artery Disease; Deep Vein Thrombosis; Hypotension; Myocardial Infarction; Peripheral Arterial Disease; Peripheral Venous Disease; Phlebitis; Vasculitis Gastrointestinal Medical History: Negative for: Cirrhosis ; Colitis; Crohnos; Hepatitis A; Hepatitis B; Hepatitis C Endocrine Medical History: Negative for:  Type I Diabetes; Type II Diabetes Genitourinary Medical History: Negative for: End Stage Renal Disease Immunological Medical History: Negative for: Lupus Erythematosus; Raynaudos; Scleroderma Integumentary (Skin) Medical History: Negative for: History of Burn; History of pressure wounds DRISHTI, PEPPERMAN ([id number]) Musculoskeletal Medical History: Negative for: Gout; Rheumatoid Arthritis; Osteoarthritis; Osteomyelitis Neurologic Medical History: Positive for: Dementia Negative for: Neuropathy; Quadriplegia; Paraplegia; Seizure Disorder Past Medical History Notes: parkinsons, tremor Oncologic Medical History: Negative for: Received Chemotherapy; Received Radiation Psychiatric Medical History: Negative for: Anorexia/bulimia; Confinement Anxiety Immunizations Pneumococcal Vaccine: Received Pneumococcal Vaccination: Yes Immunization Notes: up to date Implantable Devices No devices added Family and Social History Cancer: No; Diabetes: No; Heart Disease: Yes - Father,Siblings; Hereditary Spherocytosis: No; Hypertension: No; Kidney Disease: No; Lung Disease: No; Seizures: No; Stroke: No; Thyroid Problems: No; Tuberculosis: No; Never smoker; Marital Status - Married; Alcohol Use: Never; Drug Use: No  History; Caffeine Use: Never; Financial Concerns: No; Food, Clothing or Shelter Needs: No; Support System Lacking: No; Transportation Concerns: No Electronic Signature(s) Signed: 08/31/2020 3:21:38 PM By: Lenda Kelp PA-C Signed: 09/03/2020 2:10:08 PM By: Phillis Haggis, Dondra Prader Entered By: Phillis Haggis, Dondra Prader on 08/31/2020 10:29:29 [name] ([id number]) -------------------------------------------------------------------------------- SuperBill Details Patient Name: [name] Date of Service: 08/31/2020 Medical Record Number: [id number] Patient Account Number: [id number] Date of Birth/Sex: 10/23/1922 (86 y.o. F) Treating RN: Huel Coventry Primary Care Provider: Aura Dials Other Clinician: Referring Provider: Aura Dials Treating Provider/Extender: Rowan Blase in Treatment: 0 Diagnosis Coding ICD-10 Codes Code Description [phone] Pressure ulcer of other site, stage 3 G20 Parkinson's disease Facility Procedures CPT4 Code: [id number] Description: 11044 - DEB BONE 20 SQ CM/< Modifier: Quantity: 1 CPT4 Code: Description: ICD-10 Diagnosis Description L89.893 Pressure ulcer of other site, stage 3 Modifier: Quantity: Physician Procedures CPT4: Description Modifier Quantity Code [id number] 99214 - WC PHYS LEVEL 4 - EST PT 25 1 CPT4: ICD-10 Diagnosis Description L89.893 Pressure ulcer of other site, stage 3 G20 Parkinson's disease CPT4: 7425956 Debridement; bone (includes epidermis, dermis, subQ tissue, muscle and/or fascia, if performed) 1st 20 1 sqcm or less CPT4: ICD-10 Diagnosis Description L89.893 Pressure ulcer of other site, stage 3 Electronic Signature(s) Signed: 08/31/2020 1:48:20 PM By: Lenda Kelp PA-C Entered By: Lenda Kelp on 08/31/2020 13:48:19

## 2020-09-03 NOTE — Progress Notes (Signed)
Tina, [name] ([id number]) Visit Report for 08/31/2020 Abuse/Suicide Risk Screen Details Patient Name: [name], [name] Date of Service: 08/31/2020 10:30 AM Medical Record Number: [id number] Patient Account Number: [id number] Date of Birth/Sex: March 28, 1923 (86 y.o. F) Treating RN: Rogers Blocker Primary Care Anapaula Severt: Aura Dials Other Clinician: Referring Willine Schwalbe: Aura Dials Treating Elridge Stemm/Extender: Rowan Blase in Treatment: 0 Abuse/Suicide Risk Screen Items Answer ABUSE RISK SCREEN: Has anyone close to you tried to hurt or harm you recentlyo No Do you feel uncomfortable with anyone in your familyo No Has anyone forced you do things that you didnot want to doo No Notes patient unable to respond Electronic Signature(s) Signed: 09/03/2020 2:10:08 PM By: Phillis Haggis, Dondra Prader Entered By: Phillis Haggis, Dondra Prader on 08/31/2020 10:29:57 [name] ([id number]) -------------------------------------------------------------------------------- Activities of Daily Living Details Patient Name: [name] Date of Service: 08/31/2020 10:30 AM Medical Record Number: [id number] Patient Account Number: [id number] Date of Birth/Sex: Jun 25, 1923 (86 y.o. F) Treating RN: Rogers Blocker Primary Care Alise Calais: Aura Dials Other Clinician: Referring Tray Klayman: Aura Dials Treating Allyssia Skluzacek/Extender: Rowan Blase in Treatment: 0 Activities of Daily Living Items Answer Activities of Daily Living (Please select one for each item) Drive Automobile Not Able Take Medications Not Able Use Telephone Not Able Care for Appearance Not Able Use Toilet Not Able Mady Haagensen / Shower Not Able Dress Self Not Able Feed Self Not Able Walk Not Able Get In / Out Bed Not Able Housework Not Able Prepare Meals Not Able Handle Money Not Able Shop for Self Not Able Electronic Signature(s) Signed: 09/03/2020 2:10:08 PM By: Phillis Haggis, Dondra Prader Entered By: Phillis Haggis, Dondra Prader  on 08/31/2020 10:30:17 [name] ([id number]) -------------------------------------------------------------------------------- Education Screening Details Patient Name: [name] Date of Service: 08/31/2020 10:30 AM Medical Record Number: [id number] Patient Account Number: [id number] Date of Birth/Sex: [birth year] (86 y.o. F) Treating RN: Rogers Blocker Primary Care Ruslan Mccabe: Aura Dials Other Clinician: Referring Nicholus Chandran: Aura Dials Treating Georgie Eduardo/Extender: Rowan Blase in Treatment: 0 Primary Learner Assessed: Caregiver daughter-renee Reason Patient is not Primary Learner: pt unable to respond/not alert Learning Preferences/Education Level/Primary Language Learning Preference: Explanation Preferred Language: English Cognitive Barrier Language Barrier: No Translator Needed: No Memory Deficit: No Emotional Barrier: No Cultural/Religious Beliefs Affecting Medical Care: No Physical Barrier Impaired Vision: No Impaired Hearing: No Decreased Hand dexterity: No Knowledge/Comprehension Knowledge Level: High Comprehension Level: High Ability to understand written instructions: High Ability to understand verbal instructions: High Electronic Signature(s) Signed: 09/03/2020 2:10:08 PM By: Phillis Haggis, Dondra Prader Entered By: Phillis Haggis, Dondra Prader on 08/31/2020 10:31:59 [name] ([id number]) -------------------------------------------------------------------------------- Fall Risk Assessment Details Patient Name: [name] Date of Service: 08/31/2020 10:30 AM Medical Record Number: [id number] Patient Account Number: [id number] Date of Birth/Sex: May 19, 1923 (86 y.o. F) Treating RN: Rogers Blocker Primary Care Kimber Fritts: Aura Dials Other Clinician: Referring Kieanna Rollo: Aura Dials Treating Ralpheal Zappone/Extender: Rowan Blase in Treatment: 0 Fall Risk Assessment Items Have you had 2 or more falls in the last 12 monthso 0 No Have you  had any fall that resulted in injury in the last 12 monthso 0 No FALLS RISK SCREEN History of falling - immediate or within 3 months 0 No Secondary diagnosis (Do you have 2 or more medical diagnoseso) 15 Yes Ambulatory aid None/bed rest/wheelchair/nurse 0 Yes Crutches/cane/walker 0 No Furniture 0 No Intravenous therapy Access/Saline/Heparin Lock 0 No Gait/Transferring Normal/ bed rest/ wheelchair 0 Yes Weak (short steps with or without shuffle, stooped but able to lift head while walking, may 0 No seek support from furniture)  Impaired (short steps with shuffle, may have difficulty arising from chair, head down, impaired 0 No balance) Mental Status Oriented to own ability 0 No Electronic Signature(s) Signed: 09/03/2020 2:10:08 PM By: Phillis Haggis, Dondra Prader Entered By: Phillis Haggis, Dondra Prader on 08/31/2020 10:32:24 [name] ([id number]) -------------------------------------------------------------------------------- Foot Assessment Details Patient Name: [name] Date of Service: 08/31/2020 10:30 AM Medical Record Number: [id number] Patient Account Number: [id number] Date of Birth/Sex: 05/22/1923 (86 y.o. F) Treating RN: Rogers Blocker Primary Care Katty Fretwell: Aura Dials Other Clinician: Referring Semaja Lymon: Aura Dials Treating Ercel Pepitone/Extender: Rowan Blase in Treatment: 0 Foot Assessment Items [x]  Unable to perform due to altered mental status Site Locations + = Sensation present, - = Sensation absent, C = Callus, U = Ulcer R = Redness, W = Warmth, M = Maceration, PU = Pre-ulcerative lesion F = Fissure, S = Swelling, D = Dryness Assessment Right: Left: Other Deformity: No No Prior Foot Ulcer: No No Prior Amputation: No No Charcot Joint: No No Ambulatory Status: Non-ambulatory Assistance Device: Wheelchair Gait: Electronic Signature(s) Signed: 09/03/2020 2:10:08 PM By: 14/02/2020, Phillis Haggis Entered By: Dondra Prader, Phillis Haggis on 08/31/2020  10:35:06 14/11/2019 ([name]) -------------------------------------------------------------------------------- Nutrition Risk Screening Details Patient Name: [id number] Date of Service: 08/31/2020 10:30 AM Medical Record Number: 14/11/2019 Patient Account Number: [id number] Date of Birth/Sex: 08/03/1923 (86 y.o. F) Treating RN: 06[birth year]08-1999 Primary Care Nicle Connole: Rogers Blocker Other Clinician: Referring Inna Tisdell: Aura Dials Treating Alayja Armas/Extender: Aura Dials in Treatment: 0 Height (in): 60 Weight (lbs): 90 Body Mass Index (BMI): 17.6 Nutrition Risk Screening Items Score Screening NUTRITION RISK SCREEN: I have an illness or condition that made me change the kind and/or amount of food I eat 0 No I eat fewer than two meals per day 3 Yes I eat few fruits and vegetables, or milk products 2 Yes I have three or more drinks of beer, liquor or wine almost every day 0 No I have tooth or mouth problems that make it hard for me to eat 2 Yes I don't always have enough money to buy the food I need 0 No I eat alone most of the time 0 No I take three or more different prescribed or over-the-counter drugs a day 1 Yes Without wanting to, I have lost or gained 10 pounds in the last six months 0 No I am not always physically able to shop, cook and/or feed myself 2 Yes Nutrition Protocols Good Risk Protocol Moderate Risk Protocol High Risk Proctocol 0 Provide education on nutrition Risk Level: High Risk Score: 10 Notes protein supplements, sometimes an egg for breakfast and supper varies per daughter-weight stays stable Electronic Signature(s) Signed: 09/03/2020 2:10:08 PM By: 14/02/2020, Phillis Haggis Entered By: Dondra Prader, Phillis Haggis on 08/31/2020 10:34:52

## 2020-09-06 ENCOUNTER — Other Ambulatory Visit: Payer: Medicare Other | Admitting: Nurse Practitioner

## 2020-09-06 ENCOUNTER — Telehealth: Payer: Self-pay

## 2020-09-06 NOTE — Telephone Encounter (Signed)
Copied from CRM [id number]. Topic: General - Inquiry >> Sep 06, 2020  3:19 PM Daphine Deutscher D wrote: Reason for CRM: Pt's daughter and [employer]PA called saying the forms needed more information on them.   Daughter states they need to change tylenol dosage 650 mg reg. Not PRN.   Crush any meds that are ok to crush.  Former pressure wound on her bottom that was healed but they keep a pad on the area and they let it air out twice a day... Are of chafing on her back that needs to be aired out twice a day cover with zero form , non stick pad and a bandage to secure for 30 days.  Pressure area on top rt ear that they put zero form on twice a day.  Liquid needs thicking 2 scoops nectar consisty   Protein powder in mild with breakfast and dinner.  Ensure twice a day if she hasn't eaten 50 % of her meal   [place] at [facility] number is [phone] ask for Deedra Ehrich [phone]  Daughter is also asking for orders for wound care for a tear on patients arm.   Daughter CB#  [phone]

## 2020-09-07 ENCOUNTER — Other Ambulatory Visit: Payer: Self-pay

## 2020-09-07 ENCOUNTER — Encounter: Payer: Medicare Other | Admitting: Physician Assistant

## 2020-09-07 DIAGNOSIS — L89893 Pressure ulcer of other site, stage 3: Secondary | ICD-10-CM | POA: Diagnosis not present

## 2020-09-07 NOTE — Telephone Encounter (Signed)
Yes, can we type other orders on letter head to attach with FL2 please.

## 2020-09-07 NOTE — Progress Notes (Signed)
Tina, [name] ([id number]) Visit Report for 09/07/2020 Arrival Information Details Patient Name: Tina, [name] Date of Service: 09/07/2020 2:00 PM Medical Record Number: [id number] Patient Account Number: [id number] Date of Birth/Sex: 08-26-1923 (86 y.o. F) Treating RN: Rogers Blocker Primary Care Breylan Lefevers: Aura Dials Other Clinician: Referring Rohnan Bartleson: Aura Dials Treating Zailyn Thoennes/Extender: Rowan Blase in Treatment: 1 Visit Information History Since Last Visit Pain Present Now: Unable to Respond Patient Arrived: Wheel Chair Arrival Time: 14:18 Accompanied By: daughter Transfer Assistance: Other Patient Identification Verified: Yes Secondary Verification Process Completed: Yes Notes patient stays in chair Electronic Signature(s) Signed: 09/07/2020 4:48:21 PM By: Phillis Haggis, Dondra Prader RN Entered By: Phillis Haggis, Dondra Prader on 09/07/2020 14:18:55 [name] ([id number]) -------------------------------------------------------------------------------- Clinic Level of Care Assessment Details Patient Name: [name] Date of Service: 09/07/2020 2:00 PM Medical Record Number: [id number] Patient Account Number: [id number] Date of Birth/Sex: 07/05/1923 (86 y.o. F) Treating RN: Rogers Blocker Primary Care Gerica Koble: Aura Dials Other Clinician: Referring Guadalupe Kerekes: Aura Dials Treating Danai Gotto/Extender: Rowan Blase in Treatment: 1 Clinic Level of Care Assessment Items TOOL 1 Quantity Score  - Use when EandM and Procedure is performed on INITIAL visit 0 ASSESSMENTS - Nursing Assessment / Reassessment  - General Physical Exam (combine w/ comprehensive assessment (listed just below) when performed on new 0 pt. evals)  - 0 Comprehensive Assessment (HX, ROS, Risk Assessments, Wounds Hx, etc.) ASSESSMENTS - Wound and Skin Assessment / Reassessment  - Dermatologic / Skin Assessment (not related to wound area) 0 ASSESSMENTS - Ostomy  and/or Continence Assessment and Care  - Incontinence Assessment and Management 0  - 0 Ostomy Care Assessment and Management (repouching, etc.) PROCESS - Coordination of Care  - Simple Patient / Family Education for ongoing care 0  - 0 Complex (extensive) Patient / Family Education for ongoing care  - 0 Staff obtains Chiropractor, Records, Test Results / Process Orders  - 0 Staff telephones HHA, Nursing Homes / Clarify orders / etc  - 0 Routine Transfer to another Facility (non-emergent condition)  - 0 Routine Hospital Admission (non-emergent condition)  - 0 New Admissions / Manufacturing engineer / Ordering NPWT, Apligraf, etc.  - 0 Emergency Hospital Admission (emergent condition) PROCESS - Special Needs  - Pediatric / Minor Patient Management 0  - 0 Isolation Patient Management  - 0 Hearing / Language / Visual special needs  - 0 Assessment of Community assistance (transportation, D/C planning, etc.)  - 0 Additional assistance / Altered mentation  - 0 Support Surface(s) Assessment (bed, cushion, seat, etc.) INTERVENTIONS - Miscellaneous  - External ear exam 0  - 0 Patient Transfer (multiple staff / Nurse, adult / Similar devices)  - 0 Simple Staple / Suture removal (25 or less)  - 0 Complex Staple / Suture removal (26 or more)  - 0 Hypo/Hyperglycemic Management (do not check if billed separately)  - 0 Ankle / Brachial Index (ABI) - do not check if billed separately Has the patient been seen at the hospital within the last three years: Yes Total Score: 0 Level Of Care: ____ [name] ([id number]) Electronic Signature(s) Signed: 09/07/2020 4:48:21 PM By: Phillis Haggis, Dondra Prader RN Entered By: Phillis Haggis, Dondra Prader on 09/07/2020 15:35:24 [name] ([id number]) -------------------------------------------------------------------------------- Encounter Discharge Information Details Patient Name: [name] Date of Service: 09/07/2020 2:00 PM Medical Record Number: [id number] Patient Account Number: [id number] Date of Birth/Sex: 1923/09/08 (86 y.o. F) Treating RN: Rogers Blocker Primary Care Sandrea Boer: Aura Dials Other Clinician: Referring Kameren Baade: Aura Dials Treating Noraa Pickeral/Extender:  Allen Derry Weeks in Treatment: 1 Encounter Discharge Information Items Post Procedure Vitals Discharge Condition: Stable Temperature (F): 98.5 Ambulatory Status: Wheelchair Pulse (bpm): 88 Discharge Destination: Home Respiratory Rate 16 (breaths/min): Transportation: Private Auto Unable to obtain vitals unable to obtain BP d/t contracture/open Accompanied By: daughter Reason: wound Schedule Follow-up Appointment: Yes Clinical Summary of Care: Electronic Signature(s) Signed: 09/07/2020 4:48:21 PM By: Phillis Haggis, Dondra Prader RN Entered By: Phillis Haggis, Dondra Prader on 09/07/2020 15:40:02 [name] ([id number]) -------------------------------------------------------------------------------- Lower Extremity Assessment Details Patient Name: [name] Date of Service: 09/07/2020 2:00 PM Medical Record Number: [id number] Patient Account Number: [id number] Date of Birth/Sex: 1922-12-04 (86 y.o. F) Treating RN: Rogers Blocker Primary Care Grayton Lobo: Aura Dials Other Clinician: Referring Shavy Beachem: Aura Dials Treating Atina Feeley/Extender: Rowan Blase in Treatment: 1 Electronic Signature(s) Signed: 09/07/2020 3:42:11 PM By: Phillis Haggis, Dondra Prader RN Entered By: Phillis Haggis, Dondra Prader on 09/07/2020 15:42:11 [name] ([id number]) -------------------------------------------------------------------------------- Multi Wound Chart Details Patient Name: [name] Date of Service: 09/07/2020 2:00 PM Medical Record Number: [id number] Patient Account Number: [id number] Date of Birth/Sex: 03-22-1923 (86 y.o. F) Treating RN: Rogers Blocker Primary Care Taffany Heiser:  Aura Dials Other Clinician: Referring Ermagene Saidi: Aura Dials Treating Jamita Mckelvin/Extender: Rowan Blase in Treatment: 1 Vital Signs Height(in): 60 Pulse(bpm): 88 Weight(lbs): 95 Blood Pressure(mmHg): Body Mass Index(BMI): 19 Temperature(F): 98.5 Respiratory Rate(breaths/min): 16 Photos: Wound Location: Toe Second Left Forearm Right Ear Wounding Event: Pressure Injury Skin Tear/Laceration Pressure Injury Primary Etiology: Pressure Ulcer Skin Tear Pressure Ulcer Comorbid History: Anemia, Hypertension, Dementia Anemia, Hypertension, Dementia Anemia, Hypertension, Dementia Date Acquired: 08/23/2020 09/05/2020 09/07/2020 Weeks of Treatment: 1 0 0 Wound Status: Open Open Open Measurements L x W x D (cm) 0.5x0.7x0.2 10x3.5x0.1 0.6x0.3x0.1 Area (cm) : 0.275 27.489 0.141 Volume (cm) : 0.055 2.749 0.014 % Reduction in Area: 16.70% 0.00% 0.00% % Reduction in Volume: 44.40% 0.00% 0.00% Classification: Category/Stage IV Full Thickness Without Exposed Category/Stage II Support Structures Exudate Amount: Medium Medium Medium Exudate Type: Serosanguineous Sanguinous Sanguinous Exudate Color: red, brown red red Wound Margin: Distinct, outline attached N/A N/A Granulation Amount: None Present (0%) Large (67-100%) Large (67-100%) Granulation Quality: N/A Red Red Necrotic Amount: Large (67-100%) None Present (0%) None Present (0%) Necrotic Tissue: Eschar, Adherent Slough N/A N/A Exposed Structures: Fat Layer (Subcutaneous Tissue): Fat Layer (Subcutaneous Tissue): Fat Layer (Subcutaneous Tissue): Yes Yes Yes Tendon: Yes Fascia: No Fascia: No Bone: Yes Tendon: No Tendon: No Fascia: No Muscle: No Muscle: No Muscle: No Joint: No Joint: No Joint: No Bone: No Bone: No Epithelialization: None None None [name], [name] ([id number]) Treatment Notes Electronic Signature(s) Signed: 09/07/2020 4:48:21 PM By: Phillis Haggis, Dondra Prader RN Entered By: Phillis Haggis, Dondra Prader on  09/07/2020 14:55:58 [name] ([id number]) -------------------------------------------------------------------------------- Multi-Disciplinary Care Plan Details Patient Name: [name] Date of Service: 09/07/2020 2:00 PM Medical Record Number: [id number] Patient Account Number: [id number] Date of Birth/Sex: 1923/09/14 (86 y.o. F) Treating RN: Rogers Blocker Primary Care Astin Sayre: Aura Dials Other Clinician: Referring Lyndel Dancel: Aura Dials Treating Alijah Hyde/Extender: Rowan Blase in Treatment: 1 Active Inactive Necrotic Tissue Nursing Diagnoses: Impaired tissue integrity related to necrotic/devitalized tissue Goals: Necrotic/devitalized tissue will be minimized in the wound bed Date Initiated: 08/31/2020 Target Resolution Date: 10/01/2020 Goal Status: Active Patient/caregiver will verbalize understanding of reason and process for debridement of necrotic tissue Date Initiated: 08/31/2020 Target Resolution Date: 10/01/2020 Goal Status: Active Interventions: Assess patient pain level pre-, during and post procedure and prior to discharge Provide education on necrotic tissue and debridement process Treatment Activities:  Apply topical anesthetic as ordered : 08/31/2020 Biologic debridement : 08/31/2020 Enzymatic debridement : 08/31/2020 Excisional debridement : 08/31/2020 Test ordered outside of clinic : 08/31/2020 Notes: Nutrition Nursing Diagnoses: Imbalanced nutrition Goals: Patient/caregiver agrees to and verbalizes understanding of need to use nutritional supplements and/or vitamins as prescribed Date Initiated: 08/31/2020 Target Resolution Date: 10/01/2020 Goal Status: Active Interventions: Assess patient nutrition upon admission and as needed per policy Notes: Orientation to the Wound Care Program Nursing Diagnoses: Knowledge deficit related to the wound healing center program Goals: Patient/caregiver will verbalize understanding of the Wound Healing  Center Program Date Initiated: 08/31/2020 Target Resolution Date: 10/01/2020 Goal Status: Active Interventions: Provide education on orientation to the wound center [name], [name] ([id number]) Notes: Pressure Nursing Diagnoses: Knowledge deficit related to causes and risk factors for pressure ulcer development Goals: Patient will remain free from development of additional pressure ulcers Date Initiated: 08/31/2020 Target Resolution Date: 10/01/2020 Goal Status: Active Patient/caregiver will verbalize risk factors for pressure ulcer development Date Initiated: 08/31/2020 Target Resolution Date: 10/01/2020 Goal Status: Active Patient/caregiver will verbalize understanding of pressure ulcer management Date Initiated: 08/31/2020 Target Resolution Date: 10/01/2020 Goal Status: Active Interventions: Assess: immobility, friction, shearing, incontinence upon admission and as needed Assess potential for pressure ulcer upon admission and as needed Provide education on pressure ulcers Notes: Wound/Skin Impairment Nursing Diagnoses: Impaired tissue integrity Goals: Patient will have a decrease in wound volume by X% from date: (specify in notes) Date Initiated: 08/31/2020 Target Resolution Date: 10/01/2020 Goal Status: Active Patient/caregiver will verbalize understanding of skin care regimen Date Initiated: 08/31/2020 Target Resolution Date: 10/01/2020 Goal Status: Active Interventions: Assess patient/caregiver ability to obtain necessary supplies Assess patient/caregiver ability to perform ulcer/skin care regimen upon admission and as needed Assess ulceration(s) every visit Provide education on ulcer and skin care Treatment Activities: Skin care regimen initiated : 08/31/2020 Notes: Electronic Signature(s) Signed: 09/07/2020 4:48:21 PM By: Phillis HaggisSanchez Pereyda, Dondra PraderKenia RN Entered By: Phillis HaggisSanchez Pereyda, Dondra PraderKenia on 09/07/2020 14:54:23 [name], [name]  ([id number]) -------------------------------------------------------------------------------- Pain Assessment Details Patient Name: [name], [name] Date of Service: 09/07/2020 2:00 PM Medical Record Number: [id number] Patient Account Number: [id number] Date of Birth/Sex: 1923/02/10 (86 y.o. F) Treating RN: Rogers BlockerSanchez, Kenia Primary Care Makenah Karas: Aura Dialsannady, [name] Other Clinician: Referring Bridgid Printz: Aura Dialsannady, [name] Treating Mardie Kellen/Extender: Rowan BlaseStone, Hoyt Weeks in Treatment: 1 Active Problems Location of Pain Severity and Description of Pain Patient Has Paino Patient Unable to Respond Site Locations Pain Management and Medication Current Pain Management: Electronic Signature(s) Signed: 09/07/2020 4:48:21 PM By: Phillis HaggisSanchez Pereyda, Dondra PraderKenia RN Entered By: Phillis HaggisSanchez Pereyda, Dondra PraderKenia on 09/07/2020 14:32:53 [name], [name] ([id number]) -------------------------------------------------------------------------------- Wound Assessment Details Patient Name: [name], [name] Date of Service: 09/07/2020 2:00 PM Medical Record Number: [id number] Patient Account Number: [id number] Date of Birth/Sex: 1923/02/10 (86 y.o. F) Treating RN: Rogers BlockerSanchez, Kenia Primary Care Sabre Leonetti: Aura Dialsannady, [name] Other Clinician: Referring Liahm Grivas: Aura Dialsannady, [name] Treating [name] [name]/Extender: Rowan BlaseStone, Hoyt Weeks in Treatment: 1 Wound Status Wound Number: 14 Primary Etiology: Pressure Ulcer Wound Location: Toe Second Wound Status: Open Wounding Event: Pressure Injury Comorbid History: Anemia, Hypertension, Dementia Date Acquired: 08/23/2020 Weeks Of Treatment: 1 Clustered Wound: No Photos Wound Measurements Length: (cm) 0.5 Width: (cm) 0.7 Depth: (cm) 0.2 Area: (cm) 0.275 Volume: (cm) 0.055 % Reduction in Area: 16.7% % Reduction in Volume: 44.4% Epithelialization: None Tunneling: No Undermining: No Wound Description Classification: Category/Stage IV F Wound Margin: Distinct, outline attached S Exudate Amount:  Medium Exudate Type: Serosanguineous Exudate Color: red, brown oul Odor After Cleansing: No lough/Fibrino Yes Wound Bed Granulation Amount: None Present (  0%) Exposed Structure Necrotic Amount: Large (67-100%) Fascia Exposed: No Necrotic Quality: Eschar, Adherent Slough Fat Layer (Subcutaneous Tissue) Exposed: Yes Tendon Exposed: Yes Muscle Exposed: No Joint Exposed: No Bone Exposed: Yes Treatment Notes Wound #14 (Toe Second) Notes scell, conform-secured with tape Electronic Signature(s) Signed: 09/07/2020 4:48:21 PM By: Phillis Haggis, Dondra Prader RN [place], [place] ([id number]) Entered By: Phillis Haggis, Dondra Prader on 09/07/2020 14:45:14 [name] ([id number]) -------------------------------------------------------------------------------- Wound Assessment Details Patient Name: [name] Date of Service: 09/07/2020 2:00 PM Medical Record Number: [id number] Patient Account Number: [id number] Date of Birth/Sex: 07-26-1923 (86 y.o. F) Treating RN: Rogers Blocker Primary Care Velvie Thomaston: Aura Dials Other Clinician: Referring [place]: Aura Dials Treating Ronnald Shedden/Extender: Rowan Blase in Treatment: 1 Wound Status Wound Number: 15 Primary Etiology: Skin Tear Wound Location: Left Forearm Wound Status: Open Wounding Event: Skin Tear/Laceration Comorbid History: Anemia, Hypertension, Dementia Date Acquired: 09/05/2020 Weeks Of Treatment: 0 Clustered Wound: No Photos Wound Measurements Length: (cm) 4.5 Width: (cm) 1.8 Depth: (cm) 0.1 Area: (cm) 6.362 Volume: (cm) 0.636 % Reduction in Area: 76.9% % Reduction in Volume: 76.9% Epithelialization: None Tunneling: No Undermining: No Wound Description Classification: Full Thickness Without Exposed Support Structu Exudate Amount: Medium Exudate Type: Sanguinous Exudate Color: red res Foul Odor After Cleansing: No Slough/Fibrino No Wound Bed Granulation Amount: Large (67-100%) Exposed  Structure Granulation Quality: Red Fascia Exposed: No Necrotic Amount: None Present (0%) Fat Layer (Subcutaneous Tissue) Exposed: Yes Tendon Exposed: No Muscle Exposed: No Joint Exposed: No Bone Exposed: No Treatment Notes Wound #15 (Left Forearm) Notes xeroform, abd, stretchnet Electronic Signature(s) Signed: 09/07/2020 4:48:21 PM By: Lajean Manes RN Previous Signature: 09/07/2020 2:48:19 PM Version By: Phillis Haggis, Dondra Prader RN [place], [place] ([id number]) Entered By: Phillis Haggis, Dondra Prader on 09/07/2020 15:36:12 [name] ([id number]) -------------------------------------------------------------------------------- Wound Assessment Details Patient Name: [name] Date of Service: 09/07/2020 2:00 PM Medical Record Number: [id number] Patient Account Number: [id number] Date of Birth/Sex: 09-02-1923 (86 y.o. F) Treating RN: Rogers Blocker Primary Care Terena Bohan: Aura Dials Other Clinician: Referring Mammie Meras: Aura Dials Treating Quinteria Chisum/Extender: Rowan Blase in Treatment: 1 Wound Status Wound Number: 16 Primary Etiology: Pressure Ulcer Wound Location: Right Ear Wound Status: Open Wounding Event: Pressure Injury Comorbid History: Anemia, Hypertension, Dementia Date Acquired: 09/07/2020 Weeks Of Treatment: 0 Clustered Wound: No Photos Wound Measurements Length: (cm) 0.6 % Width: (cm) 0.3 % Depth: (cm) 0.1 E Area: (cm) 0.141 Volume: (cm) 0.014 Reduction in Area: 0% Reduction in Volume: 0% pithelialization: None Tunneling: No Undermining: No Wound Description Classification: Category/Stage II F Exudate Amount: Medium S Exudate Type: Sanguinous Exudate Color: red oul Odor After Cleansing: No lough/Fibrino No Wound Bed Granulation Amount: Large (67-100%) Exposed Structure Granulation Quality: Red Fascia Exposed: No Necrotic Amount: None Present (0%) Fat Layer (Subcutaneous Tissue) Exposed: Yes Tendon Exposed:  No Muscle Exposed: No Joint Exposed: No Bone Exposed: No Treatment Notes Wound #16 (Right Ear) Notes xeroform, foam secured with tape Electronic Signature(s) Signed: 09/07/2020 2:49:32 PM By: Phillis Haggis, Dondra Prader RN [place], [place] ([id number]) Entered By: Phillis Haggis, Dondra Prader on 09/07/2020 14:49:31 [name] ([id number]) -------------------------------------------------------------------------------- Vitals Details Patient Name: [name] Date of Service: 09/07/2020 2:00 PM Medical Record Number: [id number] Patient Account Number: [id number] Date of Birth/Sex: 06-Oct-1922 (86 y.o. F) Treating RN: Rogers Blocker Primary Care Valecia Beske: Aura Dials Other Clinician: Referring Lakisha Peyser: Aura Dials Treating Nikolai Wilczak/Extender: Rowan Blase in Treatment: 1 Vital Signs Time Taken: 14:18 Temperature (F): 98.5 Height (in): 60 Pulse (bpm): 88 Weight (lbs): 95 Respiratory Rate (breaths/min):  16 Body Mass Index (BMI): 18.6 Reference Range: 80 - 120 mg / dl Notes unable to obtain BP d/t open wound on available arm (contractures) Electronic Signature(s) Signed: 09/07/2020 4:48:21 PM By: Phillis Haggis, Dondra Prader RN Entered By: Phillis Haggis, Dondra Prader on 09/07/2020 14:32:16

## 2020-09-07 NOTE — Telephone Encounter (Signed)
All orders written and printed on letter head, signed by [name]. FL2 corrected and signed by [name]. All forms faxed back as requested to [facility] at [facility].   Called and notified patient's daughter that this was completed.

## 2020-09-07 NOTE — Progress Notes (Addendum)
[name], [name] ([id number]) Visit Report for 09/07/2020 Chief Complaint Document Details Patient Name: [name], [name] Date of Service: 09/07/2020 2:00 PM Medical Record Number: [id number] Patient Account Number: [id number] Date of Birth/Sex: 11-03-1922 (86 y.o. F) Treating RN: Huel Coventry Primary Care Provider: Aura Dials Other Clinician: Referring Provider: Aura Dials Treating Provider/Extender: Rowan Blase in Treatment: 1 Information Obtained from: Patient Chief Complaint Left 2nd toe ulcer, Skin tear Left forearm, and Right ear pressure ulcer Electronic Signature(s) Signed: 09/07/2020 2:52:01 PM By: Lenda Kelp PA-C Previous Signature: 09/07/2020 2:47:09 PM Version By: Lenda Kelp PA-C Entered By: Lenda Kelp on 09/07/2020 14:52:01 [name] ([id number]) -------------------------------------------------------------------------------- Debridement Details Patient Name: [name] Date of Service: 09/07/2020 2:00 PM Medical Record Number: [id number] Patient Account Number: [id number] Date of Birth/Sex: 1922/10/20 (86 y.o. F) Treating RN: Rogers Blocker Primary Care Provider: Aura Dials Other Clinician: Referring Provider: Aura Dials Treating Provider/Extender: Rowan Blase in Treatment: 1 Debridement Performed for Wound #15 Left Forearm Assessment: Performed By: Physician Nelida Meuse., PA-C Debridement Type: Debridement Level of Consciousness (Pre- Responds to Painful Stimuli procedure): Pre-procedure Verification/Time Out Yes - 14:58 Taken: Start Time: 14:58 Total Area Debrided (L x W): 4.5 (cm) x 1.8 (cm) = 8.1 (cm) Tissue and other material Non-Viable, Skin: Epidermis debrided: Level: Skin/Epidermis Debridement Description: Selective/Open Wound Instrument: Forceps, Scissors Bleeding: Minimum Hemostasis Achieved: Pressure End Time: 15:00 Procedural Pain: 0 Post Procedural Pain: 0 Response to  Treatment: Procedure was tolerated well Level of Consciousness (Post- Responds to Painful Stimuli procedure): Post Debridement Measurements of Total Wound Length: (cm) 4.5 Width: (cm) 1.8 Depth: (cm) 0.1 Volume: (cm) 0.636 Character of Wound/Ulcer Post Debridement: Stable Post Procedure Diagnosis Same as Pre-procedure Electronic Signature(s) Signed: 09/07/2020 4:48:21 PM By: Lajean Manes RN Signed: 09/07/2020 4:50:08 PM By: Lenda Kelp PA-C Entered By: Phillis Haggis, Dondra Prader on 09/07/2020 15:04:18 [name] ([id number]) -------------------------------------------------------------------------------- HPI Details Patient Name: [name] Date of Service: 09/07/2020 2:00 PM Medical Record Number: [id number] Patient Account Number: [id number] Date of Birth/Sex: Jun 23, 1923 (86 y.o. F) Treating RN: Huel Coventry Primary Care Provider: Aura Dials Other Clinician: Referring Provider: Aura Dials Treating Provider/Extender: Rowan Blase in Treatment: 1 History of Present Illness HPI Description: 01/07/17 this is a 86 year old woman admitted to the clinic today for review of a pressure ulcer on her lower sacrum. She is referred from her primary physician's office after being seen on 3/22 with a 3 cm pressure area. Her daughter and caretaker accompanied her today state that the area first became obvious about a month ago and his since deteriorated. They have recently got [name] home health involved and have been using [name] to the wound. They have ordered a pressure relief surface for her mattress. They are turning her religiously. They state that she eats well and they've been forcing fluids on her. She is on a multivitamin. Looking through [facility] point last albumin I see was 4.4 on 10/28. The patient has advanced parkinsonism which looks superficially like advanced Parkinson's disease although her daughter tells me she did not ever respond  to Sinemet therefore this may have another pathology with signs of parkinsonism. However I think this is largely a mute point currently. She also has dementia and is nonambulatory. Since this started they have been keeping her in bed and turning her religiously every 2 hours. She lives at home in [place] with her husband with 24/7 care giving 01/13/17 santyl change qd. Still will require  further debridement. continue santyl. 01/20/17; patient's wound actually looks some better less adherent necrotic surface. There is actually visible granulation. We're using [name] 01/27/17; better-looking surface but still a lot of necrotic tissue on the base of this wound. The periwound erythema is better than last week we are still using [name]. Her daughter tells [place] that she is still having trouble with the pressure-relief mattress through medical modalities 02/03/18; I had the patient scheduled for a two week followup however her daughter brought her in early concerned for discoloration on 2 areas of the wound circumference. We have bee using santyl 02/10/17;Better looking surface to the wound. Rim appears better suggesting better offloading. Using santyl 02/24/17; change to Silver Collegen last time. Wound appears better. 03/10/17; still using silver collagen religious offloading. Intake is satisfactory per her daughter. Dimension slightly better 03/12/2017 -- Dr. Jannetta Quint patient who had been seen 2 days ago and was doing fairly well. The patient is brought in by her daughter who noticed a new wound just above the previous wound on her sacral area and going on more to the left lateral side. She was very concerned and we asked her to get in for an opinion. 03/17/17; above is noted. The patient has developed a progressive area to the left of her original wound. This seems to this started with a ring of red skin with a more pale interior almost looking fungal. There was a rim of blister through part of the area  although this did not look like zoster. They have been applying triamcinolone that was prescribed last week by Dr. Meyer Russel and the area has a fold to a linear band area which is confluent, erythematous and with obvious epidermal swelling but there is no overt tenderness or crepitus. She has lost some surface epithelium closer to the wound surface itself and now has a more superficial wound in this area and the extending erythema goes towards the left buttock. This is well demarcated between involved in normal skin but once again does not appear to be at all tender. If there is a contact issue here I cannot get the history out of the daughter or the caregiver that are with her. 03/24/17; the patient arrives today with the wound slightly worse slightly more drainage. The bandlike area of erythema that I treated as a possible pineal infection has improved somewhat although proximally is still has confluent erythema without overt tenderness. We have been using silver alginate since the most recent deterioration. To the bandlike degree of erythema we have been using Lotrisone cream 03/31/17; patient arrives today with the wound slightly larger, necrotic surface and surrounding erythema. The bandlike area of erythema that I treated as a possible pineal infection is less swollen but still present I've been using Lotrisone cream on that largely related to the presence of a tinea looking infection when this was first seen. We've been using silver alginate. X-ray that I ordered last week showed no acute bony abnormalities mild fecal impaction. Lab work showed a comprehensive metabolic panel that was normal including an albumin of 3.8. White count was 9.5 hemoglobin 12.3 differential count normal. C-reactive protein was less than 1 and sedimentation rate and 17. The latter 2 values does not support an ongoing bacterial infection. 04/14/17; patient arrives after a 2 week hiatus. Her wound is not in good condition.  Although the base of the wound looks stable she still has an erythematous area that was apparently blistered over the weekend. This again points to the left.  As our intake nurse pointed out today this is in the area where the tissue folds together and we may need to prevent try to prevent this. Lab work and x-ray that I did to 3 weeks ago were unremarkable including her albumin nevertheless she is an extremely frail condition physically. We have have been using [name] 04/22/17; I changed her to silver alginate because of the surrounding maceration and moisture last week. The daughter did not like the way the wound looked in the middle of the week and changed her back to JoslinSantyl. They're putting gauze on top of this. Thinks still using Lotrisone. 05/06/17 on evaluation today patient sacral wound appears to be doing okay and does not seem to be any worse. She is having no significant pain during evaluation today the secondary to mental status she is unable to rate or describe whether she had any pain she was not however flinching. Her daughter states that the wound does appear to be looking better to her. Still we are having difficulty with the skinfold that seems to be closing in on itself at this point. All in all I feel like she is making some good progress in the [name] seems to be the official for her. They do tell me that a refill if we are gonna continue that today. No fevers, chills, nausea, or vomiting noted at this time. 05/13/17 presents today for evaluation concerning her ongoing sacral pressure ulcer. Unfortunately she also has an area of deep tissue injury in the right Ischial region which is starting to show up. The sacral wound also continues to show signs of necrotic tissue overlying and has declined. Overall we really have not seen a significant improvement in the past several months in regard to the sacral wound and now patient is starting to develop a new wound in the right Ischial region.  Obviously this is not trending in the direction that we want to see. No fevers, chills, nausea, or vomiting noted at this time. 05/19/17; I have not seen this wound and almost a month however there is nothing really positive to say about it. Necrotic tissue over the surface which superiorly I think abuts on her sacrum. She has surrounding erythema. I would be surprised if there is not underlying osteomyelitis or soft tissue infection. 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 She apparently eats well per description although I wonder about this looking at her. Lab work I did probably 4 weeks ago however was really quite normal including a serum albumin 05/26/17; culture I did last week grew Escherichia coli and methicillin sensitive staph aureus which should've been well covered by the Augmentin and ciprofloxacin. Indeed the erythema around the wound in the bed of the wound looks somewhat better. Her intake is still satisfactory. They now have a near fluidized bed 06/02/17; they completed the antibiotics last Friday. Using collagen. Daughter still reports eating and drinking well. There is less visualized Tina MainlandSHAW, Laiza W. ([id number]) erythema around the wound. 06/16/17; large pressure ulcer over her lower sacrum and coccyx. Using [name] to the wound bed. 06/30/17; certainly no change in dimensions of this large stage III wound over her sacrum and coccyx. They've been using [name]. There is no exposed bone. She has a candidal/tinea area in the right inguinal area. Other than that her daughter relates that she is eating and drinking well there changing her positioning to make sure the areas offloaded 07/14/17; no major change in the dimensions of this large stage III  wound. Initially a smaller wound that became secondarily infected causing significant tissue breakdown although it is been stable in the last several weeks. Tunneling superiorly at roughly 1:00 no change here either. There is no  bone palpable. Both the patient's daughter and caretaker states that she eats well. I have not rechecked her blood work 07/27/17; patient arrives in clinic today and generally a deteriorated looking state. Mild fever with axillary temperature of 100.4. Daughter reports she has not been eating and drinking well since yesterday. She looks more pale and thin and less responsive. We have been using silver collagen to her wound 08/11/17; since the last time the patient was here things have gone better. Her fever went down and she started eating and drinking again. Culture I did of the wound showed methicillin sensitive staph aureus, Morganella and enterococcus. I only treated her with Keflex which would've not covered the Morganella and enterococcus however the purulent area on the 2:00 side of her wound is a lot better and the bandlike erythema that concern me also was resolved. I'd called the daughter last week to confirm that she was a lot better. She finished the Keflex last Thursday she also suffered a skin tear this morning perhaps while putting on her incontinence brief period is on the lateral aspect of her right leg. Clean wound with the surface epithelium not viable. 08/25/17; last week the patient was noted to have erythema around the wound margin and a slight fever which the patient's daughter says was 48. Our office was contacted by home health however we did not have a space to work the patient in that she went to see her primary physician Dr. Maurice March. She was not febrile during this visit on 08/21/17 there was erythema around the wound similar to last occasion. Dr. Maurice March in reference to my culture from 07/28/17 and put her on doxycycline capsules which they're opening twice a day for 10 days. 09/17/17; patient arrives today with the wound bed looking fairly well granulated. There is undermining from 4 to 6:00 although this seems to of contracted slightly. She does not have obvious infection  although the daughter states there was some darkening of the wound circumference that is not evident today. They state she is eating well. They are concerned about oral thrush 09/30/16; very fibrin looking granulated wound bed. Her undermining from 4 to 6:00 is about the same but also appears to be well granulated. She has rolled edges of senescent tissue from roughly 7 to 12:00. There is no evidence of infection 10/13/17 on evaluation today patient appears to be doing fairly well all things considered in regard to her sacral wound. There's really not a lot of significant change or improvement she does have some evidence of contusion and deep tissue injury around the left border of the wound that patient's daughter did inquire about today. Nonetheless overall the wound appears to be doing about the same in my opinion. There is no significant indication of infection there also is no significant slough noted at this point. 10/27/17 She is here in follow up evaluation of a sacral ulcer. She is accompanied by her daughter and caregiver. There is red granulation tissue throughout, persistent discoloration to left border; this appears consistent with deep tissue injury. There are multiple areas covered in foam borders, tegaderm, etc that are "preventative" with "no wounds". We will continue with prisma and continue with two week follow ups 11/17/17 on evaluation today patient appears to be doing decently well in  regard to her wound at this point. She continues to have a sacral wound ulcer. We see her roughly every two weeks. In the last week she was actually in the hospital due to what was diagnosed as sepsis although no organisms were ever identified in the actual blood cultures. The hospital really was not sure that the wound was the cause of the infection but they really did not figure out anything else that would be a causative organism she did have a CT scan and that revealed no evidence of pneumonia there  was also no evidence of urinary tract infection. Again it very well could have been the wound called in those although wound appears to be doing fairly well at this point. 12/15/17 on evaluation today patient actually appears to be doing about the same in regard to her sacral ulcer. The one thing different is that she does seem to have a rash where her right arm is contracted and being held to the thorax. The area underlying both on the ventral side of the arm as well is the thorax where it comes in contact shows evidence of a rash which appears to be fungal in nature. There does not appear to be any evidence of infection lies at this point. There does not appear to be a rash consistent with shingles which was also of concern initially. No fevers, chills, nausea, or vomiting noted at this time. 12/29/17 on evaluation today patient appears to be doing a little better in regard to her sacral wound although the one changes the 12 o'clock location of the wound seems to have attached at one point which no longer allows this to pull back. This has caused an area of undermining that seems to be attaching as well in the 12 o'clock location. I do think that this is something that can be managed and is not necessarily a bad thing. Nonetheless she does seem to have some pain with exploration of this region of undermining. She continues to have an area under her right arm on the chest wall of erythema although this is a little better especially after the home health nurse is actually got in order for Diflucan times one for the patient. She is a new area on the left wrist that looks like because of the contraction her nail may have pushed in on her wrist area causing a slight cut which subsequently became infected. She has some honey crusted drainage noted. 03/09/18 undervaluation today patient appears to be doing fairly well in regard to her wounds in general. She has been tolerating the dressing changes without  complication. Overall I'm pleased with the progress that seems to be made week by week especially in regard to the sacral ulcer. Her daughter and the caregiver seem to take very good care of her. With that being said she has very little undermining in regard to the sacral wound in good granulation she also has good epithelialization noted. 03/23/18 on evaluation today patient appears to be doing rather well in regard to her sacral wound. Unfortunately she does have a little bit of necrotic tissue noted in the central portion of her wound on the left elbow. This is due to the fact that she is lying on this arm seeing how it is contracted. Currently her daughter has started to avoid lying on the side it all due to the fact that the necrotic tissue was noted. With that being said they have been taking very good care of her in  my pinion. Fortunately there does not appear to be any evidence of infection which is good news. Overall I'm pleased with the progress she's made other than in regard to the elbow. 04/06/18 on evaluation today patient actually appears to be doing okay in regard to the sacral wound. In fact it appears to be somewhat smaller in general although there does appear to be more undermining at the 6 o'clock location than was previously noted. She has been tolerating the dressing changes without complication in general which is also good news. Nonetheless she does have a new small skin tear/opening on her leg which was evaluated today. Fortunately this appears to be minimal and the Xeroform gauze which has been used up to this point in the past has been utilized very effectively seems to be doing well in that regard. Her left elbow ulcer seems to also be doing excellent at this point making good progress. 04/20/18 on evaluation today patient appears to be doing fairly well in regard to her sacral wound. This definitely seems to be better than during last evaluation where it was indeed infected.  With that being said she has been tolerating the dressing changes as best it can be expected. Her elbow seems to be drying out and a lot of times the dressing is getting stuck according to her caregiver. [name], [name] ([id number]) 05/04/18-She is seen in follow-up evaluation for a sacral and left elbow pressure ulcer. These are stable/improved and we will continue with same treatment plan and she will follow-up in 2 weeks 05/18/18 on evaluation today patient actually appears to be doing better in regard to her left elbow ulcer. The sacral wound is also shown signs of improvement which is good news. With that being said she does have a new right medial lower extremity ulcer which actually appears to show some signs of cellulitis/infection. She is previously taken Augmentin with good result. Nonetheless the patient really does not have anything that I can culture at the site there's actually some good epithelialization noted although again there is some cellulitis appearance as well. 06/01/18 on evaluation today patient appears to actually be doing very well in general in regard to her left elbow wound which is smaller in her lower extremity ulcer which is actually healed. With that being said the sacral wound in particular show signs of improvement although she still has undermining in the six-7 o'clock location. No fevers, chills, nausea, or vomiting noted at this time. 06/15/18 on evaluation today patient actually appears to be doing excellent in regard to her elbow ulcer on the left elbow. This is shown signs of great improvement and I do feel like she is very close to healing in this regard. In general her sacral wound also appears to be doing well there's good sign of improvement there as well as far as the overall surface of the wound is concerned. She does have a significant area of tunneling at the 6 o'clock location that still is about the same really there's no significant improvement in that  regard. Nonetheless we are attempting to try and treat this by way of packing with Prisma followed by silver so to help control moisture. The patient been tolerating the dressing changes without complication. 06/29/18 on evaluation today patient actually appears to be doing very well in regard to her left elbow ulcer and her sacral ulcer. She has been tolerating the dressing changes without complication which is good news. In fact the sacral wound appears to have almost  completely closed although she does have an area of undermining/tunneling at roughly the 7 o'clock location that is still unfortunately having a more difficult time closing although as opposed to during the last visit I was unable to get a normal Q-tip down into the undermined region. Actually had to use a skinny probe or else the backside of the Q-tip were just a wooden stake was. I think this is a good sign that this area seems to be filling in which is wonderful. Overall I'm pleased with how things are progressing. 07/13/18 on evaluation today patient appears to be doing a little worse in regard to the overall appearance of the sacral wound. She has been tolerating the dressing changes without complication. With that being said the patient's daughter tells me that the home health nurse actually dug around it significantly in the 12 o'clock location of the wound and subsequently reopen this area which was very well healed previous. This was a new nurse that hasn't generally been coming out to see the patient. Nonetheless they are very upset about this and the fact that the wound was actually worse when she was done "taking". It does appear that the wound is recovering and in fact since that point has closed quite significantly compared to what they tell me it looked like when she was done. 07/27/18 evaluation today patient appears to be doing well in regard to her elbow ulcer as well as the sacral ulcer. She has actually been  doing excellent at this point in time. Fortunately there does not appear to be any evidence of infection currently. Overall I'm very happy with the progress at this time. She does have a couple other areas where there was some redness noted a fortunately there does not appear to be the evidence of more significant skin breakdown although there just slightly blanchable regions that I think do need to be watched carefully. 08/10/18 upon evaluation today patient actually appears to be showing signs of improvement at all sites evaluated today. This is even true in the sacral region where she has less expensive undermining although there is still undermining noted unfortunately. They're having a very hard time being able to pack anything into this area. She may benefit from utilizing gentamicin cream and just a cover dressing. 08/24/18 on evaluation today patient sacral ulcer actually appears to be almost completely healed. She has done extremely well in this regard and I think this is in most part due to her family who has been excellent in taking care of her as well as her caregiver. Overall I'm extremely pleased in this regard. Her left shoulder area did open up since the last visit where I saw her although this was something that we potentially expected. Fortunately it does not appear to show any signs of significant infection overall I think that an alginate dressing would be very well at the site. 09/07/18 on evaluation today the patient actually appears to be completely healed in regard to all of her wounds. There is currently nothing remaining open at this point. She has been tolerating the dressing changes without complication. Overall I'm very pleased with how things have progressed. Patient's daughter is also extremely happy. 09/20/18 on evaluation today patient actually appears to be completely healed on evaluation today. This was true at the last visit as well but we wanted to continue to  monitor in order to ensure that there was nothing that reopened or worsens in the interim. Everything appears to be doing great.  Readmission: 08/31/2020 upon evaluation today patient presents for reevaluation here in our clinic. She is here actually for a different issue from what I saw her for previous. She currently is experiencing issues with a wound on her second toe left foot. This showed up right around Thanksgiving initially was just red that opened up into an ulceration. She has an abscess on the dorsal aspect of her toe which is not open at this point. With that being said her daughter is concerned about osteomyelitis. Currently the patient is on palliative care but not hospice care. She has been placed on Keflex which actually ended Sunday night. That does seem to have help with erythema according to the daughter. Nonetheless no cultures were obtained initially I think that would be a good idea. There also appears to be some necrotic debris/bone that I see easily accessible that I am going to remove and sent for pathology in order to identify whether or not she has osteomyelitis. I am also going to suggest that we likely order an x-ray today that was all discussed with the patient's daughter. 09/07/2020 upon evaluation today patient's wound that we have been treating on her toe actually does appear to be doing better. She is still on the Keflex which I have given her an extension on for the next 2 months. I think considering the osteomyelitis this is going to be a good way to go. With that being said I do feel like that the new issues with the skin tear on her left forearm as well as the pressure ulcer on her right ear which we previously taken care of can be managed likely with Xeroform gauze. Electronic Signature(s) Signed: 09/07/2020 4:32:09 PM By: Lenda Kelp PA-C Entered By: Lenda Kelp on 09/07/2020 16:32:09 [name]  ([id number]) -------------------------------------------------------------------------------- Physical Exam Details Patient Name: [name] Date of Service: 09/07/2020 2:00 PM Medical Record Number: [id number] Patient Account Number: [id number] Date of Birth/Sex: 1923-04-25 (86 y.o. F) Treating RN: Huel Coventry Primary Care Provider: Aura Dials Other Clinician: Referring Provider: Aura Dials Treating Provider/Extender: Rowan Blase in Treatment: 1 Constitutional Well-nourished and well-hydrated in no acute distress. Respiratory normal breathing without difficulty. Psychiatric this patient is able to make decisions and demonstrates good insight into disease process. Alert and Oriented x 3. pleasant and cooperative. Notes On inspection patient's wound bed actually showed signs on the form of requiring some sharp debridement to remove skin from the surface of the wound where it is been peeled back and I do think that this is necessary to allow to heal appropriately. The remainder of the skin does appear to have reattached quite well. In regard to the ear this appears to be very superficial at this point which is good news hopefully with appropriate care will be able to get this to close quite readily. Otherwise the alginate seems to be doing well for the wound between her toes. Electronic Signature(s) Signed: 09/07/2020 4:33:37 PM By: Lenda Kelp PA-C Entered By: Lenda Kelp on 09/07/2020 16:33:36 Ost, Myles Gip ([id number]) -------------------------------------------------------------------------------- Physician Orders Details Patient Name: [name] Date of Service: 09/07/2020 2:00 PM Medical Record Number: [id number] Patient Account Number: [id number] Date of Birth/Sex: April 19, 1923 (86 y.o. F) Treating RN: Rogers Blocker Primary Care Provider: Aura Dials Other Clinician: Referring Provider: Aura Dials Treating Provider/Extender: Rowan Blase in Treatment: 1 Verbal / Phone Orders: No Diagnosis Coding ICD-10 Coding Code Description [id number] Pressure ulcer of other site, stage 3 G20 Parkinson's  disease S51.802A Unspecified open wound of left forearm, initial encounter L89.892 Pressure ulcer of other site, stage 2 Wound Cleansing Wound #14 Toe Second o Clean wound with Normal Saline. o Cleanse wound with mild soap and water Wound #15 Left Forearm o Clean wound with Normal Saline. o Cleanse wound with mild soap and water Wound #16 Right Ear o Clean wound with Normal Saline. o Cleanse wound with mild soap and water Anesthetic (add to Medication List) Wound #14 Toe Second o Topical Lidocaine 4% cream applied to wound bed prior to debridement (In Clinic Only). Wound #15 Left Forearm o Topical Lidocaine 4% cream applied to wound bed prior to debridement (In Clinic Only). Wound #16 Right Ear o Topical Lidocaine 4% cream applied to wound bed prior to debridement (In Clinic Only). Skin Barriers/Peri-Wound Care Wound #14 Toe Second o Moisturizing lotion Primary Wound Dressing Wound #14 Toe Second o Silver Alginate Wound #15 Left Forearm o Xeroform gauze Wound #16 Right Ear o Xeroform gauze Secondary Dressing Wound #14 Toe Second o Gauze and Kerlix/Conform - secure with tape Wound #15 Left Forearm o ABD pad - or non woven gauze to maintain primary dressing in place o Other - stretch net in office-may use conform/rolled gauze to secure dressing Wound #16 Right Ear o Foam - folded around ear secured with tape to reduce pressure [name], [name]. ([id number]) Dressing Change Frequency Wound #14 Toe Second o Change dressing every day. - or PRN if draining Follow-up Appointments Wound #14 Toe Second o Return Appointment in 1 month Wound #15 Left Forearm o Return Appointment in 1 month Wound #16 Right Ear o Return Appointment in 1 month Medications-please add to  medication list. Wound #14 Toe Second o P.O. Antibiotics - cefalexin (Keflex) 500 mg capsule oral every 6 hours (4 times a day) x 60 days starting 09/07/20 Electronic Signature(s) Signed: 09/07/2020 4:48:21 PM By: Phillis Haggis, Dondra Prader RN Signed: 09/07/2020 4:50:08 PM By: Lenda Kelp PA-C Entered By: Phillis Haggis, Dondra Prader on 09/07/2020 16:00:33 [name] ([id number]) -------------------------------------------------------------------------------- Problem List Details Patient Name: [name] Date of Service: 09/07/2020 2:00 PM Medical Record Number: [id number] Patient Account Number: [id number] Date of Birth/Sex: 1923-07-12 (86 y.o. F) Treating RN: Huel Coventry Primary Care Provider: Aura Dials Other Clinician: Referring Provider: Aura Dials Treating Provider/Extender: Rowan Blase in Treatment: 1 Active Problems ICD-10 Encounter Code Description Active Date MDM Diagnosis L89.893 Pressure ulcer of other site, stage 3 08/31/2020 No Yes G20 Parkinson's disease 08/31/2020 No Yes S51.802A Unspecified open wound of left forearm, initial encounter 09/07/2020 No Yes L89.892 Pressure ulcer of other site, stage 2 09/07/2020 No Yes Inactive Problems Resolved Problems Electronic Signature(s) Signed: 09/07/2020 2:50:54 PM By: Lenda Kelp PA-C Previous Signature: 09/07/2020 2:47:02 PM Version By: Lenda Kelp PA-C Entered By: Lenda Kelp on 09/07/2020 14:50:53 [name] ([id number]) -------------------------------------------------------------------------------- Progress Note Details Patient Name: [name] Date of Service: 09/07/2020 2:00 PM Medical Record Number: [id number] Patient Account Number: [id number] Date of Birth/Sex: 03-30-1923 (86 y.o. F) Treating RN: Huel Coventry Primary Care Provider: Aura Dials Other Clinician: Referring Provider: Aura Dials Treating Provider/Extender: Rowan Blase in Treatment:  1 Subjective Chief Complaint Information obtained from Patient Left 2nd toe ulcer, Skin tear Left forearm, and Right ear pressure ulcer History of Present Illness (HPI) 01/07/17 this is a 86 year old woman admitted to the clinic today for review of a pressure ulcer on her lower sacrum. She is referred from her primary physician's office after being seen  on 3/22 with a 3 cm pressure area. Her daughter and caretaker accompanied her today state that the area first became obvious about a month ago and his since deteriorated. They have recently got [name] home health involved and have been using [name] to the wound. They have ordered a pressure relief surface for her mattress. They are turning her religiously. They state that she eats well and they've been forcing fluids on her. She is on a multivitamin. Looking through [facility] point last albumin I see was 4.4 on 10/28. The patient has advanced parkinsonism which looks superficially like advanced Parkinson's disease although her daughter tells me she did not ever respond to Sinemet therefore this may have another pathology with signs of parkinsonism. However I think this is largely a mute point currently. She also has dementia and is nonambulatory. Since this started they have been keeping her in bed and turning her religiously every 2 hours. She lives at home in [place] with her husband with 24/7 care giving 01/13/17 santyl change qd. Still will require further debridement. continue santyl. 01/20/17; patient's wound actually looks some better less adherent necrotic surface. There is actually visible granulation. We're using [name] 01/27/17; better-looking surface but still a lot of necrotic tissue on the base of this wound. The periwound erythema is better than last week we are still using [name]. Her daughter tells [place] that she is still having trouble with the pressure-relief mattress through medical modalities 02/03/18; I had the patient scheduled  for a two week followup however her daughter brought her in early concerned for discoloration on 2 areas of the wound circumference. We have bee using santyl 02/10/17;Better looking surface to the wound. Rim appears better suggesting better offloading. Using santyl 02/24/17; change to Silver Collegen last time. Wound appears better. 03/10/17; still using silver collagen religious offloading. Intake is satisfactory per her daughter. Dimension slightly better 03/12/2017 -- Dr. Jannetta Quint patient who had been seen 2 days ago and was doing fairly well. The patient is brought in by her daughter who noticed a new wound just above the previous wound on her sacral area and going on more to the left lateral side. She was very concerned and we asked her to get in for an opinion. 03/17/17; above is noted. The patient has developed a progressive area to the left of her original wound. This seems to this started with a ring of red skin with a more pale interior almost looking fungal. There was a rim of blister through part of the area although this did not look like zoster. They have been applying triamcinolone that was prescribed last week by Dr. Meyer Russel and the area has a fold to a linear band area which is confluent, erythematous and with obvious epidermal swelling but there is no overt tenderness or crepitus. She has lost some surface epithelium closer to the wound surface itself and now has a more superficial wound in this area and the extending erythema goes towards the left buttock. This is well demarcated between involved in normal skin but once again does not appear to be at all tender. If there is a contact issue here I cannot get the history out of the daughter or the caregiver that are with her. 03/24/17; the patient arrives today with the wound slightly worse slightly more drainage. The bandlike area of erythema that I treated as a possible pineal infection has improved somewhat although proximally is still  has confluent erythema without overt tenderness. We have been using  silver alginate since the most recent deterioration. To the bandlike degree of erythema we have been using Lotrisone cream 03/31/17; patient arrives today with the wound slightly larger, necrotic surface and surrounding erythema. The bandlike area of erythema that I treated as a possible pineal infection is less swollen but still present I've been using Lotrisone cream on that largely related to the presence of a tinea looking infection when this was first seen. We've been using silver alginate. X-ray that I ordered last week showed no acute bony abnormalities mild fecal impaction. Lab work showed a comprehensive metabolic panel that was normal including an albumin of 3.8. White count was 9.5 hemoglobin 12.3 differential count normal. C-reactive protein was less than 1 and sedimentation rate and 17. The latter 2 values does not support an ongoing bacterial infection. 04/14/17; patient arrives after a 2 week hiatus. Her wound is not in good condition. Although the base of the wound looks stable she still has an erythematous area that was apparently blistered over the weekend. This again points to the left. As our intake nurse pointed out today this is in the area where the tissue folds together and we may need to prevent try to prevent this. Lab work and x-ray that I did to 3 weeks ago were unremarkable including her albumin nevertheless she is an extremely frail condition physically. We have have been using [name] 04/22/17; I changed her to silver alginate because of the surrounding maceration and moisture last week. The daughter did not like the way the wound looked in the middle of the week and changed her back to [place]. They're putting gauze on top of this. Thinks still using Lotrisone. 05/06/17 on evaluation today patient sacral wound appears to be doing okay and does not seem to be any worse. She is having no significant pain during  evaluation today the secondary to mental status she is unable to rate or describe whether she had any pain she was not however flinching. Her daughter states that the wound does appear to be looking better to her. Still we are having difficulty with the skinfold that seems to be closing in on itself at this point. All in all I feel like she is making some good progress in the [name] seems to be the official for her. They do tell me that a refill if we are gonna continue that today. No fevers, chills, nausea, or vomiting noted at this time. 05/13/17 presents today for evaluation concerning her ongoing sacral pressure ulcer. Unfortunately she also has an area of deep tissue injury in the right Ischial region which is starting to show up. The sacral wound also continues to show signs of necrotic tissue overlying and has declined. Overall we really have not seen a significant improvement in the past several months in regard to the sacral wound and now patient is starting to develop a new wound in the right Ischial region. Obviously this is not trending in the direction that we want to see. No fevers, chills, nausea, or vomiting noted at this time. 05/19/17; I have not seen this wound and almost a month however there is nothing really positive to say about it. Necrotic tissue over the surface which superiorly I think abuts on her sacrum. She has surrounding erythema. I would be surprised if there is not underlying osteomyelitis or soft tissue infection. 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 She apparently eats well per description although I wonder about this looking [name], [name]. ([id number])  at her. Lab work I did probably 4 weeks ago however was really quite normal including a serum albumin 05/26/17; culture I did last week grew Escherichia coli and methicillin sensitive staph aureus which should've been well covered by the Augmentin and ciprofloxacin. Indeed the erythema around the wound  in the bed of the wound looks somewhat better. Her intake is still satisfactory. They now have a near fluidized bed 06/02/17; they completed the antibiotics last Friday. Using collagen. Daughter still reports eating and drinking well. There is less visualized erythema around the wound. 06/16/17; large pressure ulcer over her lower sacrum and coccyx. Using [name] to the wound bed. 06/30/17; certainly no change in dimensions of this large stage III wound over her sacrum and coccyx. They've been using [name]. There is no exposed bone. She has a candidal/tinea area in the right inguinal area. Other than that her daughter relates that she is eating and drinking well there changing her positioning to make sure the areas offloaded 07/14/17; no major change in the dimensions of this large stage III wound. Initially a smaller wound that became secondarily infected causing significant tissue breakdown although it is been stable in the last several weeks. Tunneling superiorly at roughly 1:00 no change here either. There is no bone palpable. Both the patient's daughter and caretaker states that she eats well. I have not rechecked her blood work 07/27/17; patient arrives in clinic today and generally a deteriorated looking state. Mild fever with axillary temperature of 100.4. Daughter reports she has not been eating and drinking well since yesterday. She looks more pale and thin and less responsive. We have been using silver collagen to her wound 08/11/17; since the last time the patient was here things have gone better. Her fever went down and she started eating and drinking again. Culture I did of the wound showed methicillin sensitive staph aureus, Morganella and enterococcus. I only treated her with Keflex which would've not covered the Morganella and enterococcus however the purulent area on the 2:00 side of her wound is a lot better and the bandlike erythema that concern me also was resolved. I'd called the  daughter last week to confirm that she was a lot better. She finished the Keflex last Thursday she also suffered a skin tear this morning perhaps while putting on her incontinence brief period is on the lateral aspect of her right leg. Clean wound with the surface epithelium not viable. 08/25/17; last week the patient was noted to have erythema around the wound margin and a slight fever which the patient's daughter says was 55. Our office was contacted by home health however we did not have a space to work the patient in that she went to see her primary physician Dr. Maurice March. She was not febrile during this visit on 08/21/17 there was erythema around the wound similar to last occasion. Dr. Maurice March in reference to my culture from 07/28/17 and put her on doxycycline capsules which they're opening twice a day for 10 days. 09/17/17; patient arrives today with the wound bed looking fairly well granulated. There is undermining from 4 to 6:00 although this seems to of contracted slightly. She does not have obvious infection although the daughter states there was some darkening of the wound circumference that is not evident today. They state she is eating well. They are concerned about oral thrush 09/30/16; very fibrin looking granulated wound bed. Her undermining from 4 to 6:00 is about the same but also appears to be well granulated. She  has rolled edges of senescent tissue from roughly 7 to 12:00. There is no evidence of infection 10/13/17 on evaluation today patient appears to be doing fairly well all things considered in regard to her sacral wound. There's really not a lot of significant change or improvement she does have some evidence of contusion and deep tissue injury around the left border of the wound that patient's daughter did inquire about today. Nonetheless overall the wound appears to be doing about the same in my opinion. There is no significant indication of infection there also is no significant  slough noted at this point. 10/27/17 She is here in follow up evaluation of a sacral ulcer. She is accompanied by her daughter and caregiver. There is red granulation tissue throughout, persistent discoloration to left border; this appears consistent with deep tissue injury. There are multiple areas covered in foam borders, tegaderm, etc that are "preventative" with "no wounds". We will continue with prisma and continue with two week follow ups 11/17/17 on evaluation today patient appears to be doing decently well in regard to her wound at this point. She continues to have a sacral wound ulcer. We see her roughly every two weeks. In the last week she was actually in the hospital due to what was diagnosed as sepsis although no organisms were ever identified in the actual blood cultures. The hospital really was not sure that the wound was the cause of the infection but they really did not figure out anything else that would be a causative organism she did have a CT scan and that revealed no evidence of pneumonia there was also no evidence of urinary tract infection. Again it very well could have been the wound called in those although wound appears to be doing fairly well at this point. 12/15/17 on evaluation today patient actually appears to be doing about the same in regard to her sacral ulcer. The one thing different is that she does seem to have a rash where her right arm is contracted and being held to the thorax. The area underlying both on the ventral side of the arm as well is the thorax where it comes in contact shows evidence of a rash which appears to be fungal in nature. There does not appear to be any evidence of infection lies at this point. There does not appear to be a rash consistent with shingles which was also of concern initially. No fevers, chills, nausea, or vomiting noted at this time. 12/29/17 on evaluation today patient appears to be doing a little better in regard to her sacral  wound although the one changes the 12 o'clock location of the wound seems to have attached at one point which no longer allows this to pull back. This has caused an area of undermining that seems to be attaching as well in the 12 o'clock location. I do think that this is something that can be managed and is not necessarily a bad thing. Nonetheless she does seem to have some pain with exploration of this region of undermining. She continues to have an area under her right arm on the chest wall of erythema although this is a little better especially after the home health nurse is actually got in order for Diflucan times one for the patient. She is a new area on the left wrist that looks like because of the contraction her nail may have pushed in on her wrist area causing a slight cut which subsequently became infected. She has some honey  crusted drainage noted. 03/09/18 undervaluation today patient appears to be doing fairly well in regard to her wounds in general. She has been tolerating the dressing changes without complication. Overall I'm pleased with the progress that seems to be made week by week especially in regard to the sacral ulcer. Her daughter and the caregiver seem to take very good care of her. With that being said she has very little undermining in regard to the sacral wound in good granulation she also has good epithelialization noted. 03/23/18 on evaluation today patient appears to be doing rather well in regard to her sacral wound. Unfortunately she does have a little bit of necrotic tissue noted in the central portion of her wound on the left elbow. This is due to the fact that she is lying on this arm seeing how it is contracted. Currently her daughter has started to avoid lying on the side it all due to the fact that the necrotic tissue was noted. With that being said they have been taking very good care of her in my pinion. Fortunately there does not appear to be any evidence of  infection which is good news. Overall I'm pleased with the progress she's made other than in regard to the elbow. 04/06/18 on evaluation today patient actually appears to be doing okay in regard to the sacral wound. In fact it appears to be somewhat smaller in general although there does appear to be more undermining at the 6 o'clock location than was previously noted. She has been tolerating the dressing changes without complication in general which is also good news. Nonetheless she does have a new small skin tear/opening on her leg which was evaluated today. Fortunately this appears to be minimal and the Xeroform gauze which has been used up to this point in the past has been utilized very effectively seems to be doing well in that regard. Her left elbow ulcer seems to also be doing excellent at this point making good [name], [name]. ([id number]) progress. 04/20/18 on evaluation today patient appears to be doing fairly well in regard to her sacral wound. This definitely seems to be better than during last evaluation where it was indeed infected. With that being said she has been tolerating the dressing changes as best it can be expected. Her elbow seems to be drying out and a lot of times the dressing is getting stuck according to her caregiver. 05/04/18-She is seen in follow-up evaluation for a sacral and left elbow pressure ulcer. These are stable/improved and we will continue with same treatment plan and she will follow-up in 2 weeks 05/18/18 on evaluation today patient actually appears to be doing better in regard to her left elbow ulcer. The sacral wound is also shown signs of improvement which is good news. With that being said she does have a new right medial lower extremity ulcer which actually appears to show some signs of cellulitis/infection. She is previously taken Augmentin with good result. Nonetheless the patient really does not have anything that I can culture at the site there's  actually some good epithelialization noted although again there is some cellulitis appearance as well. 06/01/18 on evaluation today patient appears to actually be doing very well in general in regard to her left elbow wound which is smaller in her lower extremity ulcer which is actually healed. With that being said the sacral wound in particular show signs of improvement although she still has undermining in the six-7 o'clock location. No fevers, chills, nausea, or  vomiting noted at this time. 06/15/18 on evaluation today patient actually appears to be doing excellent in regard to her elbow ulcer on the left elbow. This is shown signs of great improvement and I do feel like she is very close to healing in this regard. In general her sacral wound also appears to be doing well there's good sign of improvement there as well as far as the overall surface of the wound is concerned. She does have a significant area of tunneling at the 6 o'clock location that still is about the same really there's no significant improvement in that regard. Nonetheless we are attempting to try and treat this by way of packing with Prisma followed by silver so to help control moisture. The patient been tolerating the dressing changes without complication. 06/29/18 on evaluation today patient actually appears to be doing very well in regard to her left elbow ulcer and her sacral ulcer. She has been tolerating the dressing changes without complication which is good news. In fact the sacral wound appears to have almost completely closed although she does have an area of undermining/tunneling at roughly the 7 o'clock location that is still unfortunately having a more difficult time closing although as opposed to during the last visit I was unable to get a normal Q-tip down into the undermined region. Actually had to use a skinny probe or else the backside of the Q-tip were just a wooden stake was. I think this is a good sign that this  area seems to be filling in which is wonderful. Overall I'm pleased with how things are progressing. 07/13/18 on evaluation today patient appears to be doing a little worse in regard to the overall appearance of the sacral wound. She has been tolerating the dressing changes without complication. With that being said the patient's daughter tells me that the home health nurse actually dug around it significantly in the 12 o'clock location of the wound and subsequently reopen this area which was very well healed previous. This was a new nurse that hasn't generally been coming out to see the patient. Nonetheless they are very upset about this and the fact that the wound was actually worse when she was done "taking". It does appear that the wound is recovering and in fact since that point has closed quite significantly compared to what they tell me it looked like when she was done. 07/27/18 evaluation today patient appears to be doing well in regard to her elbow ulcer as well as the sacral ulcer. She has actually been doing excellent at this point in time. Fortunately there does not appear to be any evidence of infection currently. Overall I'm very happy with the progress at this time. She does have a couple other areas where there was some redness noted a fortunately there does not appear to be the evidence of more significant skin breakdown although there just slightly blanchable regions that I think do need to be watched carefully. 08/10/18 upon evaluation today patient actually appears to be showing signs of improvement at all sites evaluated today. This is even true in the sacral region where she has less expensive undermining although there is still undermining noted unfortunately. They're having a very hard time being able to pack anything into this area. She may benefit from utilizing gentamicin cream and just a cover dressing. 08/24/18 on evaluation today patient sacral ulcer actually appears to  be almost completely healed. She has done extremely well in this regard and I think this  is in most part due to her family who has been excellent in taking care of her as well as her caregiver. Overall I'm extremely pleased in this regard. Her left shoulder area did open up since the last visit where I saw her although this was something that we potentially expected. Fortunately it does not appear to show any signs of significant infection overall I think that an alginate dressing would be very well at the site. 09/07/18 on evaluation today the patient actually appears to be completely healed in regard to all of her wounds. There is currently nothing remaining open at this point. She has been tolerating the dressing changes without complication. Overall I'm very pleased with how things have progressed. Patient's daughter is also extremely happy. 09/20/18 on evaluation today patient actually appears to be completely healed on evaluation today. This was true at the last visit as well but we wanted to continue to monitor in order to ensure that there was nothing that reopened or worsens in the interim. Everything appears to be doing great. Readmission: 08/31/2020 upon evaluation today patient presents for reevaluation here in our clinic. She is here actually for a different issue from what I saw her for previous. She currently is experiencing issues with a wound on her second toe left foot. This showed up right around Thanksgiving initially was just red that opened up into an ulceration. She has an abscess on the dorsal aspect of her toe which is not open at this point. With that being said her daughter is concerned about osteomyelitis. Currently the patient is on palliative care but not hospice care. She has been placed on Keflex which actually ended Sunday night. That does seem to have help with erythema according to the daughter. Nonetheless no cultures were obtained initially I think that would be a  good idea. There also appears to be some necrotic debris/bone that I see easily accessible that I am going to remove and sent for pathology in order to identify whether or not she has osteomyelitis. I am also going to suggest that we likely order an x-ray today that was all discussed with the patient's daughter. 09/07/2020 upon evaluation today patient's wound that we have been treating on her toe actually does appear to be doing better. She is still on the Keflex which I have given her an extension on for the next 2 months. I think considering the osteomyelitis this is going to be a good way to go. With that being said I do feel like that the new issues with the skin tear on her left forearm as well as the pressure ulcer on her right ear which we previously taken care of can be managed likely with Xeroform gauze. [name], [name] ([id number]) Objective Constitutional Well-nourished and well-hydrated in no acute distress. Vitals Time Taken: 2:18 PM, Height: 60 in, Weight: 95 lbs, BMI: 18.6, Temperature: 98.5 F, Pulse: 88 bpm, Respiratory Rate: 16 breaths/min. General Notes: unable to obtain BP d/t open wound on available arm (contractures) Respiratory normal breathing without difficulty. Psychiatric this patient is able to make decisions and demonstrates good insight into disease process. Alert and Oriented x 3. pleasant and cooperative. General Notes: On inspection patient's wound bed actually showed signs on the form of requiring some sharp debridement to remove skin from the surface of the wound where it is been peeled back and I do think that this is necessary to allow to heal appropriately. The remainder of the skin does appear to have  reattached quite well. In regard to the ear this appears to be very superficial at this point which is good news hopefully with appropriate care will be able to get this to close quite readily. Otherwise the alginate seems to be doing well for the wound  between her toes. Integumentary (Hair, Skin) Wound #14 status is Open. Original cause of wound was Pressure Injury. The wound is located on the Toe Second. The wound measures 0.5cm length x 0.7cm width x 0.2cm depth; 0.275cm^2 area and 0.055cm^3 volume. There is bone, tendon, and Fat Layer (Subcutaneous Tissue) exposed. There is no tunneling or undermining noted. There is a medium amount of serosanguineous drainage noted. The wound margin is distinct with the outline attached to the wound base. There is no granulation within the wound bed. There is a large (67-100%) amount of necrotic tissue within the wound bed including Eschar and Adherent Slough. Wound #15 status is Open. Original cause of wound was Skin Tear/Laceration. The wound is located on the Left Forearm. The wound measures 4.5cm length x 1.8cm width x 0.1cm depth; 6.362cm^2 area and 0.636cm^3 volume. There is Fat Layer (Subcutaneous Tissue) exposed. There is no tunneling or undermining noted. There is a medium amount of sanguinous drainage noted. There is large (67-100%) red granulation within the wound bed. There is no necrotic tissue within the wound bed. Wound #16 status is Open. Original cause of wound was Pressure Injury. The wound is located on the Right Ear. The wound measures 0.6cm length x 0.3cm width x 0.1cm depth; 0.141cm^2 area and 0.014cm^3 volume. There is Fat Layer (Subcutaneous Tissue) exposed. There is no tunneling or undermining noted. There is a medium amount of sanguinous drainage noted. There is large (67-100%) red granulation within the wound bed. There is no necrotic tissue within the wound bed. Assessment Active Problems ICD-10 Pressure ulcer of other site, stage 3 Parkinson's disease Unspecified open wound of left forearm, initial encounter Pressure ulcer of other site, stage 2 Procedures Wound #15 Pre-procedure diagnosis of Wound #15 is a Skin Tear located on the Left Forearm . There was a Selective/Open  Wound Skin/Epidermis Debridement with a total area of 8.1 sq cm performed by Nelida Meuse., PA-C. With the following instrument(s): Forceps, and Scissors to remove Non-Viable tissue/material. Material removed includes Skin: Epidermis. A time out was conducted at 14:58, prior to the start of the procedure. A Minimum amount of bleeding was controlled with Pressure. The procedure was tolerated well with a pain level of 0 throughout and a pain level of 0 following the procedure. Post Debridement Measurements: 4.5cm length x 1.8cm width x 0.1cm depth; 0.636cm^3 volume. Character of Wound/Ulcer Post Debridement is stable. Post procedure Diagnosis Wound #15: Same as Pre-Procedure [name], [name]. ([id number]) Plan Wound Cleansing: Wound #14 Toe Second: Clean wound with Normal Saline. Cleanse wound with mild soap and water Wound #15 Left Forearm: Clean wound with Normal Saline. Cleanse wound with mild soap and water Wound #16 Right Ear: Clean wound with Normal Saline. Cleanse wound with mild soap and water Anesthetic (add to Medication List): Wound #14 Toe Second: Topical Lidocaine 4% cream applied to wound bed prior to debridement (In Clinic Only). Wound #15 Left Forearm: Topical Lidocaine 4% cream applied to wound bed prior to debridement (In Clinic Only). Wound #16 Right Ear: Topical Lidocaine 4% cream applied to wound bed prior to debridement (In Clinic Only). Skin Barriers/Peri-Wound Care: Wound #14 Toe Second: Moisturizing lotion Primary Wound Dressing: Wound #14 Toe Second: Silver Alginate Wound #15  Left Forearm: Xeroform gauze Wound #16 Right Ear: Xeroform gauze Secondary Dressing: Wound #14 Toe Second: Gauze and Kerlix/Conform - secure with tape Wound #15 Left Forearm: ABD pad - or non woven gauze to maintain primary dressing in place Other - stretch net in office-may use conform/rolled gauze to secure dressing Wound #16 Right Ear: Foam - folded around ear secured with  tape to reduce pressure Dressing Change Frequency: Wound #14 Toe Second: Change dressing every day. - or PRN if draining Follow-up Appointments: Wound #14 Toe Second: Return Appointment in 1 month Wound #15 Left Forearm: Return Appointment in 1 month Wound #16 Right Ear: Return Appointment in 1 month Medications-please add to medication list.: Wound #14 Toe Second: P.O. Antibiotics - cefalexin (Keflex) 500 mg capsule oral every 6 hours (4 times a day) x 60 days starting 09/07/20 1. Would recommend currently that we go ahead and continue with the wound care measures as before specifically with regard to the silver alginate for between her toes I think that is doing a great job. 2. I am also can recommend at this time that we have the patient continue to monitor for any signs of worsening as far as the facility where she currently resides is concerned. With that being said she will continually been on the Keflex for the next 2 months. 3. I am also can recommend for the ear that we use Xeroform as well as for the arm. 4. We will pad the ear with foam in order to keep pressure off of this area and subsequently we will use an ABD pad and roll gauze for the arm to secure in place. 5. I am also can recommend at this time that the patient needs to be appropriately offloaded in regard to the ear. We will see patient back for reevaluation in 4 weeks here in the clinic. If anything worsens or changes patient will contact our office for additional recommendations. Electronic Signature(s) Signed: 09/07/2020 4:34:25 PM By: Lenda Kelp PA-C Entered By: Lenda Kelp on 09/07/2020 16:34:24 [name] ([id number]) -------------------------------------------------------------------------------- SuperBill Details Patient Name: [name] Date of Service: 09/07/2020 Medical Record Number: [id number] Patient Account Number: [id number] Date of Birth/Sex: [birth year] (86 y.o. F) Treating  RN: Huel Coventry Primary Care Provider: Aura Dials Other Clinician: Referring Provider: Aura Dials Treating Provider/Extender: Rowan Blase in Treatment: 1 Diagnosis Coding ICD-10 Codes Code Description [phone] Pressure ulcer of other site, stage 3 G20 Parkinson's disease S51.802A Unspecified open wound of left forearm, initial encounter L89.892 Pressure ulcer of other site, stage 2 Facility Procedures CPT4 Code: [id number] Description: [phone] - DEBRIDE WOUND 1ST 20 SQ CM OR < Modifier: Quantity: 1 CPT4 Code: Description: ICD-10 Diagnosis Description S51.802A Unspecified open wound of left forearm, initial encounter Modifier: Quantity: Physician Procedures CPT4 Code: 3888280 Description: 99214 - WC PHYS LEVEL 4 - EST PT Modifier: 25 Quantity: 1 CPT4 Code: Description: ICD-10 Diagnosis Description L89.893 Pressure ulcer of other site, stage 3 G20 Parkinson's disease S51.802A Unspecified open wound of left forearm, initial encounter L89.892 Pressure ulcer of other site, stage 2 Modifier: Quantity: CPT4 Code: [id number] Description: 97597 - WC PHYS DEBR WO ANESTH 20 SQ CM Modifier: Quantity: 1 CPT4 Code: Description: ICD-10 Diagnosis Description S51.802A Unspecified open wound of left forearm, initial encounter Modifier: Quantity: Electronic Signature(s) Signed: 09/07/2020 4:34:43 PM By: Lenda Kelp PA-C Entered By: Lenda Kelp on 09/07/2020 16:34:43

## 2020-09-07 NOTE — Telephone Encounter (Signed)
New FL2 form filled out with medication corrections. Placed in folder for signature.   For the other orders, can this be typed on letter head or should it be on the [facility] form as well?

## 2020-09-07 NOTE — Telephone Encounter (Signed)
Pts daughter called to check on the status of the request made yesterday and needed some things on the FL2 form changed as well / Pts SENEMET CR 25/100MG  was written as 1 tab a day orally but it should be 1 tab twice a day and the COLACE 100mg  twice a day orally is interpreted as regularly but needs to state PRN for that RX / also the NIZORAL CREAM also needs a PRN or as needed on it / and you can discontinue Mupirocin for her toe, Pt doesn't use anymore / please advise when changes are made and request are complete

## 2020-09-13 ENCOUNTER — Other Ambulatory Visit: Payer: Medicare Other | Admitting: Nurse Practitioner

## 2020-09-22 ENCOUNTER — Encounter: Payer: Self-pay | Admitting: Nurse Practitioner

## 2020-09-22 DIAGNOSIS — I700 Atherosclerosis of aorta: Secondary | ICD-10-CM | POA: Insufficient documentation

## 2020-09-27 ENCOUNTER — Telehealth: Payer: Medicare Other | Admitting: Nurse Practitioner

## 2020-10-04 ENCOUNTER — Other Ambulatory Visit: Payer: Self-pay

## 2020-10-04 ENCOUNTER — Encounter: Payer: Medicare Other | Attending: Physician Assistant | Admitting: Physician Assistant

## 2020-10-04 DIAGNOSIS — L89893 Pressure ulcer of other site, stage 3: Secondary | ICD-10-CM | POA: Diagnosis not present

## 2020-10-04 DIAGNOSIS — F0280 Dementia in other diseases classified elsewhere without behavioral disturbance: Secondary | ICD-10-CM | POA: Diagnosis not present

## 2020-10-04 DIAGNOSIS — L89153 Pressure ulcer of sacral region, stage 3: Secondary | ICD-10-CM | POA: Diagnosis not present

## 2020-10-04 DIAGNOSIS — G20 Parkinson's disease: Secondary | ICD-10-CM | POA: Diagnosis not present

## 2020-10-04 DIAGNOSIS — L89892 Pressure ulcer of other site, stage 2: Secondary | ICD-10-CM | POA: Diagnosis present

## 2020-10-04 DIAGNOSIS — S51801A Unspecified open wound of right forearm, initial encounter: Secondary | ICD-10-CM | POA: Insufficient documentation

## 2020-10-04 DIAGNOSIS — X58XXXA Exposure to other specified factors, initial encounter: Secondary | ICD-10-CM | POA: Insufficient documentation

## 2020-10-04 DIAGNOSIS — S51802A Unspecified open wound of left forearm, initial encounter: Secondary | ICD-10-CM | POA: Insufficient documentation

## 2020-10-04 DIAGNOSIS — S81802A Unspecified open wound, left lower leg, initial encounter: Secondary | ICD-10-CM | POA: Insufficient documentation

## 2020-10-04 NOTE — Progress Notes (Addendum)
[name], [name] ([id number]) Visit Report for 10/04/2020 Chief Complaint Document Details Patient Name: REHEMA, SAGEL Date of Service: 10/04/2020 12:30 PM Medical Record Number: [id number] Patient Account Number: [id number] Date of Birth/Sex: 07-20-1923 (87 y.o. F) Treating RN: Huel Coventry Primary Care Provider: Aura Dials Other Clinician: Referring Provider: Aura Dials Treating Provider/Extender: Rowan Blase in Treatment: 4 Information Obtained from: Patient Chief Complaint Multiple pressure ulcers and skin tears Electronic Signature(s) Signed: 10/04/2020 1:38:11 PM By: Lenda Kelp PA-C Previous Signature: 10/04/2020 1:11:52 PM Version By: Lenda Kelp PA-C Entered By: Lenda Kelp on 10/04/2020 13:38:10 [name] ([id number]) -------------------------------------------------------------------------------- HPI Details Patient Name: [name] Date of Service: 10/04/2020 12:30 PM Medical Record Number: [id number] Patient Account Number: [id number] Date of Birth/Sex: [birth year] (87 y.o. F) Treating RN: Huel Coventry Primary Care Provider: Aura Dials Other Clinician: Referring Provider: Aura Dials Treating Provider/Extender: Rowan Blase in Treatment: 4 History of Present Illness HPI Description: 01/07/17 this is a 87 year old woman admitted to the clinic today for review of a pressure ulcer on her lower sacrum. She is referred from her primary physician's office after being seen on 3/22 with a 3 cm pressure area. Her daughter and caretaker accompanied her today state that the area first became obvious about a month ago and his since deteriorated. They have recently got [name] a home health involved and have been using [name] to the wound. They have ordered a pressure relief surface for her mattress. They are turning her religiously. They state that she eats well and they've been forcing fluids on her. She is on a multivitamin. Looking  through [facility] point last albumin I see was 4.4 on 10/28. The patient has advanced parkinsonism which looks superficially like advanced Parkinson's disease although her daughter tells me she did not ever respond to Sinemet therefore this may have another pathology with signs of parkinsonism. However I think this is largely a mute point currently. She also has dementia and is nonambulatory. Since this started they have been keeping her in bed and turning her religiously every 2 hours. She lives at home in [place] with her husband with 24/7 care giving 01/13/17 santyl change qd. Still will require further debridement. continue santyl. 01/20/17; patient's wound actually looks some better less adherent necrotic surface. There is actually visible granulation. We're using [name] 01/27/17; better-looking surface but still a lot of necrotic tissue on the base of this wound. The periwound erythema is better than last week we are still using [name]. Her daughter tells [place] that she is still having trouble with the pressure-relief mattress through medical modalities 02/03/18; I had the patient scheduled for a two week followup however her daughter brought her in early concerned for discoloration on 2 areas of the wound circumference. We have bee using santyl 02/10/17;Better looking surface to the wound. Rim appears better suggesting better offloading. Using santyl 02/24/17; change to Silver Collegen last time. Wound appears better. 03/10/17; still using silver collagen religious offloading. Intake is satisfactory per her daughter. Dimension slightly better 03/12/2017 -- Dr. Jannetta Quint patient who had been seen 2 days ago and was doing fairly well. The patient is brought in by her daughter who noticed a new wound just above the previous wound on her sacral area and going on more to the left lateral side. She was very concerned and we asked her to get in for an opinion. 03/17/17; above is noted. The patient has  developed a progressive area to the left of her  original wound. This seems to this started with a ring of red skin with a more pale interior almost looking fungal. There was a rim of blister through part of the area although this did not look like zoster. They have been applying triamcinolone that was prescribed last week by Dr. Meyer Russel and the area has a fold to a linear band area which is confluent, erythematous and with obvious epidermal swelling but there is no overt tenderness or crepitus. She has lost some surface epithelium closer to the wound surface itself and now has a more superficial wound in this area and the extending erythema goes towards the left buttock. This is well demarcated between involved in normal skin but once again does not appear to be at all tender. If there is a contact issue here I cannot get the history out of the daughter or the caregiver that are with her. 03/24/17; the patient arrives today with the wound slightly worse slightly more drainage. The bandlike area of erythema that I treated as a possible pineal infection has improved somewhat although proximally is still has confluent erythema without overt tenderness. We have been using silver alginate since the most recent deterioration. To the bandlike degree of erythema we have been using Lotrisone cream 03/31/17; patient arrives today with the wound slightly larger, necrotic surface and surrounding erythema. The bandlike area of erythema that I treated as a possible pineal infection is less swollen but still present I've been using Lotrisone cream on that largely related to the presence of a tinea looking infection when this was first seen. We've been using silver alginate. X-ray that I ordered last week showed no acute bony abnormalities mild fecal impaction. Lab work showed a comprehensive metabolic panel that was normal including an albumin of 3.8. White count was 9.5 hemoglobin 12.3 differential count normal.  C-reactive protein was less than 1 and sedimentation rate and 17. The latter 2 values does not support an ongoing bacterial infection. 04/14/17; patient arrives after a 2 week hiatus. Her wound is not in good condition. Although the base of the wound looks stable she still has an erythematous area that was apparently blistered over the weekend. This again points to the left. As our intake nurse pointed out today this is in the area where the tissue folds together and we may need to prevent try to prevent this. Lab work and x-ray that I did to 3 weeks ago were unremarkable including her albumin nevertheless she is an extremely frail condition physically. We have have been using [name] 04/22/17; I changed her to silver alginate because of the surrounding maceration and moisture last week. The daughter did not like the way the wound looked in the middle of the week and changed her back to [place]. They're putting gauze on top of this. Thinks still using Lotrisone. 05/06/17 on evaluation today patient sacral wound appears to be doing okay and does not seem to be any worse. She is having no significant pain during evaluation today the secondary to mental status she is unable to rate or describe whether she had any pain she was not however flinching. Her daughter states that the wound does appear to be looking better to her. Still we are having difficulty with the skinfold that seems to be closing in on itself at this point. All in all I feel like she is making some good progress in the [name] seems to be the official for her. They do tell me that a refill if we  are gonna continue that today. No fevers, chills, nausea, or vomiting noted at this time. 05/13/17 presents today for evaluation concerning her ongoing sacral pressure ulcer. Unfortunately she also has an area of deep tissue injury in the right Ischial region which is starting to show up. The sacral wound also continues to show signs of necrotic tissue  overlying and has declined. Overall we really have not seen a significant improvement in the past several months in regard to the sacral wound and now patient is starting to develop a new wound in the right Ischial region. Obviously this is not trending in the direction that we want to see. No fevers, chills, nausea, or vomiting noted at this time. 05/19/17; I have not seen this wound and almost a month however there is nothing really positive to say about it. Necrotic tissue over the surface which superiorly I think abuts on her sacrum. She has surrounding erythema. I would be surprised if there is not underlying osteomyelitis or soft tissue infection. 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 She apparently eats well per description although I wonder about this looking at her. Lab work I did probably 4 weeks ago however was really quite normal including a serum albumin 05/26/17; culture I did last week grew Escherichia coli and methicillin sensitive staph aureus which should've been well covered by the Augmentin and ciprofloxacin. Indeed the erythema around the wound in the bed of the wound looks somewhat better. Her intake is still satisfactory. They now have a near fluidized bed 06/02/17; they completed the antibiotics last Friday. Using collagen. Daughter still reports eating and drinking well. There is less visualized MANAYA, BERTEAU. ([id number]) erythema around the wound. 06/16/17; large pressure ulcer over her lower sacrum and coccyx. Using [name] to the wound bed. 06/30/17; certainly no change in dimensions of this large stage III wound over her sacrum and coccyx. They've been using [name]. There is no exposed bone. She has a candidal/tinea area in the right inguinal area. Other than that her daughter relates that she is eating and drinking well there changing her positioning to make sure the areas offloaded 07/14/17; no major change in the dimensions of this large stage III wound.  Initially a smaller wound that became secondarily infected causing significant tissue breakdown although it is been stable in the last several weeks. Tunneling superiorly at roughly 1:00 no change here either. There is no bone palpable. Both the patient's daughter and caretaker states that she eats well. I have not rechecked her blood work 07/27/17; patient arrives in clinic today and generally a deteriorated looking state. Mild fever with axillary temperature of 100.4. Daughter reports she has not been eating and drinking well since yesterday. She looks more pale and thin and less responsive. We have been using silver collagen to her wound 08/11/17; since the last time the patient was here things have gone better. Her fever went down and she started eating and drinking again. Culture I did of the wound showed methicillin sensitive staph aureus, Morganella and enterococcus. I only treated her with Keflex which would've not covered the Morganella and enterococcus however the purulent area on the 2:00 side of her wound is a lot better and the bandlike erythema that concern me also was resolved. I'd called the daughter last week to confirm that she was a lot better. She finished the Keflex last Thursday she also suffered a skin tear this morning perhaps while putting on her incontinence brief period is on the  lateral aspect of her right leg. Clean wound with the surface epithelium not viable. 08/25/17; last week the patient was noted to have erythema around the wound margin and a slight fever which the patient's daughter says was 32. Our office was contacted by home health however we did not have a space to work the patient in that she went to see her primary physician Dr. Maurice March. She was not febrile during this visit on 08/21/17 there was erythema around the wound similar to last occasion. Dr. Maurice March in reference to my culture from 07/28/17 and put her on doxycycline capsules which they're opening twice a  day for 10 days. 09/17/17; patient arrives today with the wound bed looking fairly well granulated. There is undermining from 4 to 6:00 although this seems to of contracted slightly. She does not have obvious infection although the daughter states there was some darkening of the wound circumference that is not evident today. They state she is eating well. They are concerned about oral thrush 09/30/16; very fibrin looking granulated wound bed. Her undermining from 4 to 6:00 is about the same but also appears to be well granulated. She has rolled edges of senescent tissue from roughly 7 to 12:00. There is no evidence of infection 10/13/17 on evaluation today patient appears to be doing fairly well all things considered in regard to her sacral wound. There's really not a lot of significant change or improvement she does have some evidence of contusion and deep tissue injury around the left border of the wound that patient's daughter did inquire about today. Nonetheless overall the wound appears to be doing about the same in my opinion. There is no significant indication of infection there also is no significant slough noted at this point. 10/27/17 She is here in follow up evaluation of a sacral ulcer. She is accompanied by her daughter and caregiver. There is red granulation tissue throughout, persistent discoloration to left border; this appears consistent with deep tissue injury. There are multiple areas covered in foam borders, tegaderm, etc that are "preventative" with "no wounds". We will continue with prisma and continue with two week follow ups 11/17/17 on evaluation today patient appears to be doing decently well in regard to her wound at this point. She continues to have a sacral wound ulcer. We see her roughly every two weeks. In the last week she was actually in the hospital due to what was diagnosed as sepsis although no organisms were ever identified in the actual blood cultures. The hospital  really was not sure that the wound was the cause of the infection but they really did not figure out anything else that would be a causative organism she did have a CT scan and that revealed no evidence of pneumonia there was also no evidence of urinary tract infection. Again it very well could have been the wound called in those although wound appears to be doing fairly well at this point. 12/15/17 on evaluation today patient actually appears to be doing about the same in regard to her sacral ulcer. The one thing different is that she does seem to have a rash where her right arm is contracted and being held to the thorax. The area underlying both on the ventral side of the arm as well is the thorax where it comes in contact shows evidence of a rash which appears to be fungal in nature. There does not appear to be any evidence of infection lies at this point. There does not appear  to be a rash consistent with shingles which was also of concern initially. No fevers, chills, nausea, or vomiting noted at this time. 12/29/17 on evaluation today patient appears to be doing a little better in regard to her sacral wound although the one changes the 12 o'clock location of the wound seems to have attached at one point which no longer allows this to pull back. This has caused an area of undermining that seems to be attaching as well in the 12 o'clock location. I do think that this is something that can be managed and is not necessarily a bad thing. Nonetheless she does seem to have some pain with exploration of this region of undermining. She continues to have an area under her right arm on the chest wall of erythema although this is a little better especially after the home health nurse is actually got in order for Diflucan times one for the patient. She is a new area on the left wrist that looks like because of the contraction her nail may have pushed in on her wrist area causing a slight cut which subsequently  became infected. She has some honey crusted drainage noted. 03/09/18 undervaluation today patient appears to be doing fairly well in regard to her wounds in general. She has been tolerating the dressing changes without complication. Overall I'm pleased with the progress that seems to be made week by week especially in regard to the sacral ulcer. Her daughter and the caregiver seem to take very good care of her. With that being said she has very little undermining in regard to the sacral wound in good granulation she also has good epithelialization noted. 03/23/18 on evaluation today patient appears to be doing rather well in regard to her sacral wound. Unfortunately she does have a little bit of necrotic tissue noted in the central portion of her wound on the left elbow. This is due to the fact that she is lying on this arm seeing how it is contracted. Currently her daughter has started to avoid lying on the side it all due to the fact that the necrotic tissue was noted. With that being said they have been taking very good care of her in my pinion. Fortunately there does not appear to be any evidence of infection which is good news. Overall I'm pleased with the progress she's made other than in regard to the elbow. 04/06/18 on evaluation today patient actually appears to be doing okay in regard to the sacral wound. In fact it appears to be somewhat smaller in general although there does appear to be more undermining at the 6 o'clock location than was previously noted. She has been tolerating the dressing changes without complication in general which is also good news. Nonetheless she does have a new small skin tear/opening on her leg which was evaluated today. Fortunately this appears to be minimal and the Xeroform gauze which has been used up to this point in the past has been utilized very effectively seems to be doing well in that regard. Her left elbow ulcer seems to also be doing excellent at this  point making good progress. 04/20/18 on evaluation today patient appears to be doing fairly well in regard to her sacral wound. This definitely seems to be better than during last evaluation where it was indeed infected. With that being said she has been tolerating the dressing changes as best it can be expected. Her elbow seems to be drying out and a lot of times the  dressing is getting stuck according to her caregiver. [name], [name] ([id number]) 05/04/18-She is seen in follow-up evaluation for a sacral and left elbow pressure ulcer. These are stable/improved and we will continue with same treatment plan and she will follow-up in 2 weeks 05/18/18 on evaluation today patient actually appears to be doing better in regard to her left elbow ulcer. The sacral wound is also shown signs of improvement which is good news. With that being said she does have a new right medial lower extremity ulcer which actually appears to show some signs of cellulitis/infection. She is previously taken Augmentin with good result. Nonetheless the patient really does not have anything that I can culture at the site there's actually some good epithelialization noted although again there is some cellulitis appearance as well. 06/01/18 on evaluation today patient appears to actually be doing very well in general in regard to her left elbow wound which is smaller in her lower extremity ulcer which is actually healed. With that being said the sacral wound in particular show signs of improvement although she still has undermining in the six-7 o'clock location. No fevers, chills, nausea, or vomiting noted at this time. 06/15/18 on evaluation today patient actually appears to be doing excellent in regard to her elbow ulcer on the left elbow. This is shown signs of great improvement and I do feel like she is very close to healing in this regard. In general her sacral wound also appears to be doing well there's good sign of improvement  there as well as far as the overall surface of the wound is concerned. She does have a significant area of tunneling at the 6 o'clock location that still is about the same really there's no significant improvement in that regard. Nonetheless we are attempting to try and treat this by way of packing with Prisma followed by silver so to help control moisture. The patient been tolerating the dressing changes without complication. 06/29/18 on evaluation today patient actually appears to be doing very well in regard to her left elbow ulcer and her sacral ulcer. She has been tolerating the dressing changes without complication which is good news. In fact the sacral wound appears to have almost completely closed although she does have an area of undermining/tunneling at roughly the 7 o'clock location that is still unfortunately having a more difficult time closing although as opposed to during the last visit I was unable to get a normal Q-tip down into the undermined region. Actually had to use a skinny probe or else the backside of the Q-tip were just a wooden stake was. I think this is a good sign that this area seems to be filling in which is wonderful. Overall I'm pleased with how things are progressing. 07/13/18 on evaluation today patient appears to be doing a little worse in regard to the overall appearance of the sacral wound. She has been tolerating the dressing changes without complication. With that being said the patient's daughter tells me that the home health nurse actually dug around it significantly in the 12 o'clock location of the wound and subsequently reopen this area which was very well healed previous. This was a new nurse that hasn't generally been coming out to see the patient. Nonetheless they are very upset about this and the fact that the wound was actually worse when she was done "taking". It does appear that the wound is recovering and in fact since that point has closed quite  significantly compared to what they  tell me it looked like when she was done. 07/27/18 evaluation today patient appears to be doing well in regard to her elbow ulcer as well as the sacral ulcer. She has actually been doing excellent at this point in time. Fortunately there does not appear to be any evidence of infection currently. Overall I'm very happy with the progress at this time. She does have a couple other areas where there was some redness noted a fortunately there does not appear to be the evidence of more significant skin breakdown although there just slightly blanchable regions that I think do need to be watched carefully. 08/10/18 upon evaluation today patient actually appears to be showing signs of improvement at all sites evaluated today. This is even true in the sacral region where she has less expensive undermining although there is still undermining noted unfortunately. They're having a very hard time being able to pack anything into this area. She may benefit from utilizing gentamicin cream and just a cover dressing. 08/24/18 on evaluation today patient sacral ulcer actually appears to be almost completely healed. She has done extremely well in this regard and I think this is in most part due to her family who has been excellent in taking care of her as well as her caregiver. Overall I'm extremely pleased in this regard. Her left shoulder area did open up since the last visit where I saw her although this was something that we potentially expected. Fortunately it does not appear to show any signs of significant infection overall I think that an alginate dressing would be very well at the site. 09/07/18 on evaluation today the patient actually appears to be completely healed in regard to all of her wounds. There is currently nothing remaining open at this point. She has been tolerating the dressing changes without complication. Overall I'm very pleased with how things  have progressed. Patient's daughter is also extremely happy. 09/20/18 on evaluation today patient actually appears to be completely healed on evaluation today. This was true at the last visit as well but we wanted to continue to monitor in order to ensure that there was nothing that reopened or worsens in the interim. Everything appears to be doing great. Readmission: 08/31/2020 upon evaluation today patient presents for reevaluation here in our clinic. She is here actually for a different issue from what I saw her for previous. She currently is experiencing issues with a wound on her second toe left foot. This showed up right around Thanksgiving initially was just red that opened up into an ulceration. She has an abscess on the dorsal aspect of her toe which is not open at this point. With that being said her daughter is concerned about osteomyelitis. Currently the patient is on palliative care but not hospice care. She has been placed on Keflex which actually ended Sunday night. That does seem to have help with erythema according to the daughter. Nonetheless no cultures were obtained initially I think that would be a good idea. There also appears to be some necrotic debris/bone that I see easily accessible that I am going to remove and sent for pathology in order to identify whether or not she has osteomyelitis. I am also going to suggest that we likely order an x-ray today that was all discussed with the patient's daughter. 09/07/2020 upon evaluation today patient's wound that we have been treating on her toe actually does appear to be doing better. She is still on the Keflex which I have given her an  extension on for the next 2 months. I think considering the osteomyelitis this is going to be a good way to go. With that being said I do feel like that the new issues with the skin tear on her left forearm as well as the pressure ulcer on her right ear which we previously taken care of can be  managed likely with Xeroform gauze. 10/04/2020 on evaluation today patient appears to be doing somewhat laying regard to her overall wound status. Unfortunately she has several wounds which are new. Some of these in fact her skin tears that she does have a pressure injury to the sacral region that one has been the most concerned as that is what we dealt with for so long back towards the 2018 to 2019 time. Nonetheless this seems to be much more superficial than anything we dealt with back at that point. Unfortunately the patient still appears to be really limited in her motion she is also severely contracted that part of the issue that we are running into at this point. Electronic Signature(s) AYLANIE, GARINO ([id number]) Signed: 10/04/2020 4:03:27 PM By: Lenda Kelp PA-C Entered By: Lenda Kelp on 10/04/2020 16:03:27 KAILIE, KUIPERS ([id number]) -------------------------------------------------------------------------------- Physical Exam Details Patient Name: [name] Date of Service: 10/04/2020 12:30 PM Medical Record Number: [id number] Patient Account Number: [id number] Date of Birth/Sex: [birth year] (87 y.o. F) Treating RN: Huel Coventry Primary Care Provider: Aura Dials Other Clinician: Referring Provider: Aura Dials Treating Provider/Extender: Rowan Blase in Treatment: 4 Constitutional Thin and well-hydrated in no acute distress. Respiratory normal breathing without difficulty. Psychiatric this patient is able to make decisions and demonstrates good insight into disease process. Alert and Oriented x 3. pleasant and cooperative. Notes Upon inspection patient's wounds again on the ear appear to be likely secondary to pressure and same thing is true the sacral region upon evaluation today. With that being said the remaining wounds all appear to be skin tears. This patient's skin is just so fragile that it is very difficult to even move her and reposition her  without potentially causing skin tears. Even blood pressure cuffs apparently are causing some issues for her unfortunately. Electronic Signature(s) Signed: 10/04/2020 4:04:09 PM By: Lenda Kelp PA-C Entered By: Lenda Kelp on 10/04/2020 16:04:08 [name] ([id number]) -------------------------------------------------------------------------------- Physician Orders Details Patient Name: [name] Date of Service: 10/04/2020 12:30 PM Medical Record Number: [id number] Patient Account Number: [id number] Date of Birth/Sex: Jan 12, 1923 (87 y.o. F) Treating RN: Yevonne Pax Primary Care Provider: Aura Dials Other Clinician: Referring Provider: Aura Dials Treating Provider/Extender: Rowan Blase in Treatment: 4 Verbal / Phone Orders: No Diagnosis Coding ICD-10 Coding Code Description [phone] Pressure ulcer of other site, stage 3 L89.153 Pressure ulcer of sacral region, stage 3 S51.802A Unspecified open wound of left forearm, initial encounter L89.892 Pressure ulcer of other site, stage 2 S51.801A Unspecified open wound of right forearm, initial encounter G20 Parkinson's disease Follow-up Appointments o Return Appointment in 2 weeks. Off-Loading o Gel wheelchair cushion - or foam cushion o Air fluidized (Group 3) o Turn and reposition every 2 hours Additional Orders / Instructions o Follow Nutritious Diet o Increase protein intake. o Other: - Ducubivite per OTC /bottle instructions and DC centrium Wound Treatment Wound #14 - Toe Second Cleanser: Normal Saline Every Other Day Discharge Instructions: Wash your hands with soap and water. Remove old dressing, discard into plastic bag and place into trash. Cleanse the wound with Normal Saline prior to applying  a clean dressing using gauze sponges, not tissues or cotton balls. Do not scrub or use excessive force. Pat dry using gauze sponges, not tissue or cotton balls. Primary Dressing:  Silvercel Small 2x2 (in/in) Every Other Day Discharge Instructions: Apply Silvercel Small 2x2 (in/in) as instructed Secondary Dressing: Mepilex Border Flex, 4x4 (in/in) Every Other Day Discharge Instructions: Apply to wound as directed. Do not cut. Wound #15 - Forearm Wound Laterality: Left Cleanser: Normal Saline (Generic) Every Other Day Discharge Instructions: Wash your hands with soap and water. Remove old dressing, discard into plastic bag and place into trash. Cleanse the wound with Normal Saline prior to applying a clean dressing using gauze sponges, not tissues or cotton balls. Do not scrub or use excessive force. Pat dry using gauze sponges, not tissue or cotton balls. Primary Dressing: Xeroform 4x4-HBD (in/in) (Generic) Every Other Day Discharge Instructions: Apply Xeroform 4x4-HBD (in/in) as directed Secondary Dressing: Mepilex Border Flex, 4x4 (in/in) (Generic) Every Other Day Discharge Instructions: Apply to wound as directed. Do not cut. Wound #16 - Ear Wound Laterality: Right Cleanser: Normal Saline (Generic) Every Other Day Discharge Instructions: Wash your hands with soap and water. Remove old dressing, discard into plastic bag and place into trash. Cleanse the wound with Normal Saline prior to applying a clean dressing using gauze sponges, not tissues or cotton balls. Do not scrub or use excessive force. Pat dry using gauze sponges, not tissue or cotton balls. ARMONY, WEINREICH ([id number]) Primary Dressing: Xeroform-HBD 2x2 (in/in) (Generic) Every Other Day Discharge Instructions: Apply Xeroform-HBD 2x2 (in/in) as directed Secondary Dressing: Mepilex Border Flex, 4x4 (in/in) (Generic) Every Other Day Discharge Instructions: Apply to wound as directed. Do not cut. Wound #17 - Forearm Wound Laterality: Right Cleanser: Normal Saline (Generic) Every Other Day Discharge Instructions: Wash your hands with soap and water. Remove old dressing, discard into plastic bag and place  into trash. Cleanse the wound with Normal Saline prior to applying a clean dressing using gauze sponges, not tissues or cotton balls. Do not scrub or use excessive force. Pat dry using gauze sponges, not tissue or cotton balls. Primary Dressing: Xeroform-HBD 2x2 (in/in) (Generic) Every Other Day Discharge Instructions: Apply Xeroform-HBD 2x2 (in/in) as directed Secondary Dressing: Mepilex Border Flex, 4x4 (in/in) (Generic) Every Other Day Discharge Instructions: Apply to wound as directed. Do not cut. Wound #18 - Lower Leg Wound Laterality: Left Cleanser: Normal Saline (Generic) Every Other Day Discharge Instructions: Wash your hands with soap and water. Remove old dressing, discard into plastic bag and place into trash. Cleanse the wound with Normal Saline prior to applying a clean dressing using gauze sponges, not tissues or cotton balls. Do not scrub or use excessive force. Pat dry using gauze sponges, not tissue or cotton balls. Primary Dressing: Xeroform-HBD 2x2 (in/in) (Generic) Every Other Day Discharge Instructions: Apply Xeroform-HBD 2x2 (in/in) as directed Secondary Dressing: Mepilex Border Flex, 4x4 (in/in) (Generic) Every Other Day Discharge Instructions: Apply to wound as directed. Do not cut. Wound #19 - Sacrum Cleanser: Normal Saline Every Other Day Discharge Instructions: Wash your hands with soap and water. Remove old dressing, discard into plastic bag and place into trash. Cleanse the wound with Normal Saline prior to applying a clean dressing using gauze sponges, not tissues or cotton balls. Do not scrub or use excessive force. Pat dry using gauze sponges, not tissue or cotton balls. Primary Dressing: Prisma 4.34 (in) (Generic) Every Other Day Discharge Instructions: Moisten w/normal saline or sterile water; Cover wound as directed. Do not remove  from wound bed. Secondary Dressing: Mepilex Border Flex, 4x4 (in/in) (Generic) Every Other Day Discharge Instructions: Apply to  wound as directed. Do not cut. Notes ALL WOUND CARE ORDERS AS DIRECTED ABOVE AND PRN AS NEEDED FOR SOILAGE OR IF THEY BECOME LOOSE. Electronic Signature(s) Signed: 10/05/2020 4:17:09 PM By: Lenda Kelp PA-C Signed: 10/05/2020 4:22:48 PM By: Yevonne Pax RN Entered By: Yevonne Pax on 10/04/2020 17:00:14 [name] ([id number]) -------------------------------------------------------------------------------- Problem List Details Patient Name: [name] Date of Service: 10/04/2020 12:30 PM Medical Record Number: [id number] Patient Account Number: [id number] Date of Birth/Sex: Aug 20, 1923 (87 y.o. F) Treating RN: Huel Coventry Primary Care Provider: Aura Dials Other Clinician: Referring Provider: Aura Dials Treating Provider/Extender: Rowan Blase in Treatment: 4 Active Problems ICD-10 Encounter Code Description Active Date MDM Diagnosis L89.893 Pressure ulcer of other site, stage 3 08/31/2020 No Yes L89.153 Pressure ulcer of sacral region, stage 3 10/04/2020 No Yes S51.802A Unspecified open wound of left forearm, initial encounter 09/07/2020 No Yes L89.892 Pressure ulcer of other site, stage 2 09/07/2020 No Yes S51.801A Unspecified open wound of right forearm, initial encounter 10/04/2020 No Yes G20 Parkinson's disease 08/31/2020 No Yes Inactive Problems Resolved Problems Electronic Signature(s) Signed: 10/04/2020 1:37:15 PM By: Lenda Kelp PA-C Previous Signature: 10/04/2020 1:11:47 PM Version By: Lenda Kelp PA-C Entered By: Lenda Kelp on 10/04/2020 13:37:15 [name] ([id number]) -------------------------------------------------------------------------------- Progress Note Details Patient Name: [name] Date of Service: 10/04/2020 12:30 PM Medical Record Number: [id number] Patient Account Number: [id number] Date of Birth/Sex: 1922/10/17 (87 y.o. F) Treating RN: Huel Coventry Primary Care Provider: Aura Dials Other Clinician: Referring  Provider: Aura Dials Treating Provider/Extender: Rowan Blase in Treatment: 4 Subjective Chief Complaint Information obtained from Patient Multiple pressure ulcers and skin tears History of Present Illness (HPI) 01/07/17 this is a 87 year old woman admitted to the clinic today for review of a pressure ulcer on her lower sacrum. She is referred from her primary physician's office after being seen on 3/22 with a 3 cm pressure area. Her daughter and caretaker accompanied her today state that the area first became obvious about a month ago and his since deteriorated. They have recently got [name] a home health involved and have been using [name] to the wound. They have ordered a pressure relief surface for her mattress. They are turning her religiously. They state that she eats well and they've been forcing fluids on her. She is on a multivitamin. Looking through [facility] point last albumin I see was 4.4 on 10/28. The patient has advanced parkinsonism which looks superficially like advanced Parkinson's disease although her daughter tells me she did not ever respond to Sinemet therefore this may have another pathology with signs of parkinsonism. However I think this is largely a mute point currently. She also has dementia and is nonambulatory. Since this started they have been keeping her in bed and turning her religiously every 2 hours. She lives at home in [place] with her husband with 24/7 care giving 01/13/17 santyl change qd. Still will require further debridement. continue santyl. 01/20/17; patient's wound actually looks some better less adherent necrotic surface. There is actually visible granulation. We're using [name] 01/27/17; better-looking surface but still a lot of necrotic tissue on the base of this wound. The periwound erythema is better than last week we are still using [name]. Her daughter tells [place] that she is still having trouble with the pressure-relief mattress through  medical modalities 02/03/18; I had the patient scheduled for  a two week followup however her daughter brought her in early concerned for discoloration on 2 areas of the wound circumference. We have bee using santyl 02/10/17;Better looking surface to the wound. Rim appears better suggesting better offloading. Using santyl 02/24/17; change to Silver Collegen last time. Wound appears better. 03/10/17; still using silver collagen religious offloading. Intake is satisfactory per her daughter. Dimension slightly better 03/12/2017 -- Dr. Jannetta Quint patient who had been seen 2 days ago and was doing fairly well. The patient is brought in by her daughter who noticed a new wound just above the previous wound on her sacral area and going on more to the left lateral side. She was very concerned and we asked her to get in for an opinion. 03/17/17; above is noted. The patient has developed a progressive area to the left of her original wound. This seems to this started with a ring of red skin with a more pale interior almost looking fungal. There was a rim of blister through part of the area although this did not look like zoster. They have been applying triamcinolone that was prescribed last week by Dr. Meyer Russel and the area has a fold to a linear band area which is confluent, erythematous and with obvious epidermal swelling but there is no overt tenderness or crepitus. She has lost some surface epithelium closer to the wound surface itself and now has a more superficial wound in this area and the extending erythema goes towards the left buttock. This is well demarcated between involved in normal skin but once again does not appear to be at all tender. If there is a contact issue here I cannot get the history out of the daughter or the caregiver that are with her. 03/24/17; the patient arrives today with the wound slightly worse slightly more drainage. The bandlike area of erythema that I treated as a possible pineal  infection has improved somewhat although proximally is still has confluent erythema without overt tenderness. We have been using silver alginate since the most recent deterioration. To the bandlike degree of erythema we have been using Lotrisone cream 03/31/17; patient arrives today with the wound slightly larger, necrotic surface and surrounding erythema. The bandlike area of erythema that I treated as a possible pineal infection is less swollen but still present I've been using Lotrisone cream on that largely related to the presence of a tinea looking infection when this was first seen. We've been using silver alginate. X-ray that I ordered last week showed no acute bony abnormalities mild fecal impaction. Lab work showed a comprehensive metabolic panel that was normal including an albumin of 3.8. White count was 9.5 hemoglobin 12.3 differential count normal. C-reactive protein was less than 1 and sedimentation rate and 17. The latter 2 values does not support an ongoing bacterial infection. 04/14/17; patient arrives after a 2 week hiatus. Her wound is not in good condition. Although the base of the wound looks stable she still has an erythematous area that was apparently blistered over the weekend. This again points to the left. As our intake nurse pointed out today this is in the area where the tissue folds together and we may need to prevent try to prevent this. Lab work and x-ray that I did to 3 weeks ago were unremarkable including her albumin nevertheless she is an extremely frail condition physically. We have have been using [name] 04/22/17; I changed her to silver alginate because of the surrounding maceration and moisture last week. The daughter did not  like the way the wound looked in the middle of the week and changed her back to [place]. They're putting gauze on top of this. Thinks still using Lotrisone. 05/06/17 on evaluation today patient sacral wound appears to be doing okay and does not seem  to be any worse. She is having no significant pain during evaluation today the secondary to mental status she is unable to rate or describe whether she had any pain she was not however flinching. Her daughter states that the wound does appear to be looking better to her. Still we are having difficulty with the skinfold that seems to be closing in on itself at this point. All in all I feel like she is making some good progress in the [name] seems to be the official for her. They do tell me that a refill if we are gonna continue that today. No fevers, chills, nausea, or vomiting noted at this time. 05/13/17 presents today for evaluation concerning her ongoing sacral pressure ulcer. Unfortunately she also has an area of deep tissue injury in the right Ischial region which is starting to show up. The sacral wound also continues to show signs of necrotic tissue overlying and has declined. Overall we really have not seen a significant improvement in the past several months in regard to the sacral wound and now patient is starting to develop a new wound in the right Ischial region. Obviously this is not trending in the direction that we want to see. No fevers, chills, nausea, or vomiting noted at this time. 05/19/17; I have not seen this wound and almost a month however there is nothing really positive to say about it. Necrotic tissue over the surface which superiorly I think abuts on her sacrum. She has surrounding erythema. I would be surprised if there is not underlying osteomyelitis or soft tissue infection. 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 She apparently eats well per description although I wonder about this looking [name], [name]. ([id number]) at her. Lab work I did probably 4 weeks ago however was really quite normal including a serum albumin 05/26/17; culture I did last week grew Escherichia coli and methicillin sensitive staph aureus which should've been well covered by the  Augmentin and ciprofloxacin. Indeed the erythema around the wound in the bed of the wound looks somewhat better. Her intake is still satisfactory. They now have a near fluidized bed 06/02/17; they completed the antibiotics last Friday. Using collagen. Daughter still reports eating and drinking well. There is less visualized erythema around the wound. 06/16/17; large pressure ulcer over her lower sacrum and coccyx. Using [name] to the wound bed. 06/30/17; certainly no change in dimensions of this large stage III wound over her sacrum and coccyx. They've been using [name]. There is no exposed bone. She has a candidal/tinea area in the right inguinal area. Other than that her daughter relates that she is eating and drinking well there changing her positioning to make sure the areas offloaded 07/14/17; no major change in the dimensions of this large stage III wound. Initially a smaller wound that became secondarily infected causing significant tissue breakdown although it is been stable in the last several weeks. Tunneling superiorly at roughly 1:00 no change here either. There is no bone palpable. Both the patient's daughter and caretaker states that she eats well. I have not rechecked her blood work 07/27/17; patient arrives in clinic today and generally a deteriorated looking state. Mild fever with axillary temperature of 100.4. Daughter reports  she has not been eating and drinking well since yesterday. She looks more pale and thin and less responsive. We have been using silver collagen to her wound 08/11/17; since the last time the patient was here things have gone better. Her fever went down and she started eating and drinking again. Culture I did of the wound showed methicillin sensitive staph aureus, Morganella and enterococcus. I only treated her with Keflex which would've not covered the Morganella and enterococcus however the purulent area on the 2:00 side of her wound is a lot better and the  bandlike erythema that concern me also was resolved. I'd called the daughter last week to confirm that she was a lot better. She finished the Keflex last Thursday she also suffered a skin tear this morning perhaps while putting on her incontinence brief period is on the lateral aspect of her right leg. Clean wound with the surface epithelium not viable. 08/25/17; last week the patient was noted to have erythema around the wound margin and a slight fever which the patient's daughter says was 16. Our office was contacted by home health however we did not have a space to work the patient in that she went to see her primary physician Dr. Maurice March. She was not febrile during this visit on 08/21/17 there was erythema around the wound similar to last occasion. Dr. Maurice March in reference to my culture from 07/28/17 and put her on doxycycline capsules which they're opening twice a day for 10 days. 09/17/17; patient arrives today with the wound bed looking fairly well granulated. There is undermining from 4 to 6:00 although this seems to of contracted slightly. She does not have obvious infection although the daughter states there was some darkening of the wound circumference that is not evident today. They state she is eating well. They are concerned about oral thrush 09/30/16; very fibrin looking granulated wound bed. Her undermining from 4 to 6:00 is about the same but also appears to be well granulated. She has rolled edges of senescent tissue from roughly 7 to 12:00. There is no evidence of infection 10/13/17 on evaluation today patient appears to be doing fairly well all things considered in regard to her sacral wound. There's really not a lot of significant change or improvement she does have some evidence of contusion and deep tissue injury around the left border of the wound that patient's daughter did inquire about today. Nonetheless overall the wound appears to be doing about the same in my opinion. There is  no significant indication of infection there also is no significant slough noted at this point. 10/27/17 She is here in follow up evaluation of a sacral ulcer. She is accompanied by her daughter and caregiver. There is red granulation tissue throughout, persistent discoloration to left border; this appears consistent with deep tissue injury. There are multiple areas covered in foam borders, tegaderm, etc that are "preventative" with "no wounds". We will continue with prisma and continue with two week follow ups 11/17/17 on evaluation today patient appears to be doing decently well in regard to her wound at this point. She continues to have a sacral wound ulcer. We see her roughly every two weeks. In the last week she was actually in the hospital due to what was diagnosed as sepsis although no organisms were ever identified in the actual blood cultures. The hospital really was not sure that the wound was the cause of the infection but they really did not figure out anything else that would  be a causative organism she did have a CT scan and that revealed no evidence of pneumonia there was also no evidence of urinary tract infection. Again it very well could have been the wound called in those although wound appears to be doing fairly well at this point. 12/15/17 on evaluation today patient actually appears to be doing about the same in regard to her sacral ulcer. The one thing different is that she does seem to have a rash where her right arm is contracted and being held to the thorax. The area underlying both on the ventral side of the arm as well is the thorax where it comes in contact shows evidence of a rash which appears to be fungal in nature. There does not appear to be any evidence of infection lies at this point. There does not appear to be a rash consistent with shingles which was also of concern initially. No fevers, chills, nausea, or vomiting noted at this time. 12/29/17 on evaluation today  patient appears to be doing a little better in regard to her sacral wound although the one changes the 12 o'clock location of the wound seems to have attached at one point which no longer allows this to pull back. This has caused an area of undermining that seems to be attaching as well in the 12 o'clock location. I do think that this is something that can be managed and is not necessarily a bad thing. Nonetheless she does seem to have some pain with exploration of this region of undermining. She continues to have an area under her right arm on the chest wall of erythema although this is a little better especially after the home health nurse is actually got in order for Diflucan times one for the patient. She is a new area on the left wrist that looks like because of the contraction her nail may have pushed in on her wrist area causing a slight cut which subsequently became infected. She has some honey crusted drainage noted. 03/09/18 undervaluation today patient appears to be doing fairly well in regard to her wounds in general. She has been tolerating the dressing changes without complication. Overall I'm pleased with the progress that seems to be made week by week especially in regard to the sacral ulcer. Her daughter and the caregiver seem to take very good care of her. With that being said she has very little undermining in regard to the sacral wound in good granulation she also has good epithelialization noted. 03/23/18 on evaluation today patient appears to be doing rather well in regard to her sacral wound. Unfortunately she does have a little bit of necrotic tissue noted in the central portion of her wound on the left elbow. This is due to the fact that she is lying on this arm seeing how it is contracted. Currently her daughter has started to avoid lying on the side it all due to the fact that the necrotic tissue was noted. With that being said they have been taking very good care of her in my  pinion. Fortunately there does not appear to be any evidence of infection which is good news. Overall I'm pleased with the progress she's made other than in regard to the elbow. 04/06/18 on evaluation today patient actually appears to be doing okay in regard to the sacral wound. In fact it appears to be somewhat smaller in general although there does appear to be more undermining at the 6 o'clock location than was previously noted.  She has been tolerating the dressing changes without complication in general which is also good news. Nonetheless she does have a new small skin tear/opening on her leg which was evaluated today. Fortunately this appears to be minimal and the Xeroform gauze which has been used up to this point in the past has been utilized very effectively seems to be doing well in that regard. Her left elbow ulcer seems to also be doing excellent at this point making good [name], [name]. ([id number]) progress. 04/20/18 on evaluation today patient appears to be doing fairly well in regard to her sacral wound. This definitely seems to be better than during last evaluation where it was indeed infected. With that being said she has been tolerating the dressing changes as best it can be expected. Her elbow seems to be drying out and a lot of times the dressing is getting stuck according to her caregiver. 05/04/18-She is seen in follow-up evaluation for a sacral and left elbow pressure ulcer. These are stable/improved and we will continue with same treatment plan and she will follow-up in 2 weeks 05/18/18 on evaluation today patient actually appears to be doing better in regard to her left elbow ulcer. The sacral wound is also shown signs of improvement which is good news. With that being said she does have a new right medial lower extremity ulcer which actually appears to show some signs of cellulitis/infection. She is previously taken Augmentin with good result. Nonetheless the patient really does  not have anything that I can culture at the site there's actually some good epithelialization noted although again there is some cellulitis appearance as well. 06/01/18 on evaluation today patient appears to actually be doing very well in general in regard to her left elbow wound which is smaller in her lower extremity ulcer which is actually healed. With that being said the sacral wound in particular show signs of improvement although she still has undermining in the six-7 o'clock location. No fevers, chills, nausea, or vomiting noted at this time. 06/15/18 on evaluation today patient actually appears to be doing excellent in regard to her elbow ulcer on the left elbow. This is shown signs of great improvement and I do feel like she is very close to healing in this regard. In general her sacral wound also appears to be doing well there's good sign of improvement there as well as far as the overall surface of the wound is concerned. She does have a significant area of tunneling at the 6 o'clock location that still is about the same really there's no significant improvement in that regard. Nonetheless we are attempting to try and treat this by way of packing with Prisma followed by silver so to help control moisture. The patient been tolerating the dressing changes without complication. 06/29/18 on evaluation today patient actually appears to be doing very well in regard to her left elbow ulcer and her sacral ulcer. She has been tolerating the dressing changes without complication which is good news. In fact the sacral wound appears to have almost completely closed although she does have an area of undermining/tunneling at roughly the 7 o'clock location that is still unfortunately having a more difficult time closing although as opposed to during the last visit I was unable to get a normal Q-tip down into the undermined region. Actually had to use a skinny probe or else the backside of the Q-tip were just  a wooden stake was. I think this is a good sign that this  area seems to be filling in which is wonderful. Overall I'm pleased with how things are progressing. 07/13/18 on evaluation today patient appears to be doing a little worse in regard to the overall appearance of the sacral wound. She has been tolerating the dressing changes without complication. With that being said the patient's daughter tells me that the home health nurse actually dug around it significantly in the 12 o'clock location of the wound and subsequently reopen this area which was very well healed previous. This was a new nurse that hasn't generally been coming out to see the patient. Nonetheless they are very upset about this and the fact that the wound was actually worse when she was done "taking". It does appear that the wound is recovering and in fact since that point has closed quite significantly compared to what they tell me it looked like when she was done. 07/27/18 evaluation today patient appears to be doing well in regard to her elbow ulcer as well as the sacral ulcer. She has actually been doing excellent at this point in time. Fortunately there does not appear to be any evidence of infection currently. Overall I'm very happy with the progress at this time. She does have a couple other areas where there was some redness noted a fortunately there does not appear to be the evidence of more significant skin breakdown although there just slightly blanchable regions that I think do need to be watched carefully. 08/10/18 upon evaluation today patient actually appears to be showing signs of improvement at all sites evaluated today. This is even true in the sacral region where she has less expensive undermining although there is still undermining noted unfortunately. They're having a very hard time being able to pack anything into this area. She may benefit from utilizing gentamicin cream and just a cover dressing. 08/24/18 on  evaluation today patient sacral ulcer actually appears to be almost completely healed. She has done extremely well in this regard and I think this is in most part due to her family who has been excellent in taking care of her as well as her caregiver. Overall I'm extremely pleased in this regard. Her left shoulder area did open up since the last visit where I saw her although this was something that we potentially expected. Fortunately it does not appear to show any signs of significant infection overall I think that an alginate dressing would be very well at the site. 09/07/18 on evaluation today the patient actually appears to be completely healed in regard to all of her wounds. There is currently nothing remaining open at this point. She has been tolerating the dressing changes without complication. Overall I'm very pleased with how things have progressed. Patient's daughter is also extremely happy. 09/20/18 on evaluation today patient actually appears to be completely healed on evaluation today. This was true at the last visit as well but we wanted to continue to monitor in order to ensure that there was nothing that reopened or worsens in the interim. Everything appears to be doing great. Readmission: 08/31/2020 upon evaluation today patient presents for reevaluation here in our clinic. She is here actually for a different issue from what I saw her for previous. She currently is experiencing issues with a wound on her second toe left foot. This showed up right around Thanksgiving initially was just red that opened up into an ulceration. She has an abscess on the dorsal aspect of her toe which is not open at this point. With  that being said her daughter is concerned about osteomyelitis. Currently the patient is on palliative care but not hospice care. She has been placed on Keflex which actually ended Sunday night. That does seem to have help with erythema according to the daughter. Nonetheless no  cultures were obtained initially I think that would be a good idea. There also appears to be some necrotic debris/bone that I see easily accessible that I am going to remove and sent for pathology in order to identify whether or not she has osteomyelitis. I am also going to suggest that we likely order an x-ray today that was all discussed with the patient's daughter. 09/07/2020 upon evaluation today patient's wound that we have been treating on her toe actually does appear to be doing better. She is still on the Keflex which I have given her an extension on for the next 2 months. I think considering the osteomyelitis this is going to be a good way to go. With that being said I do feel like that the new issues with the skin tear on her left forearm as well as the pressure ulcer on her right ear which we previously taken care of can be managed likely with Xeroform gauze. 10/04/2020 on evaluation today patient appears to be doing somewhat laying regard to her overall wound status. Unfortunately she has several wounds which are new. Some of these in fact her skin tears that she does have a pressure injury to the sacral region that one has been the most concerned as that is what we dealt with for so long back towards the 2018 to 2019 time. Nonetheless this seems to be much more superficial than [name], [name]. ([id number]) anything we dealt with back at that point. Unfortunately the patient still appears to be really limited in her motion she is also severely contracted that part of the issue that we are running into at this point. Objective Constitutional Thin and well-hydrated in no acute distress. Vitals Time Taken: 12:48 PM, Height: 60 in, Weight: 95 lbs, BMI: 18.6, Temperature: 97.6 F, Pulse: 66 bpm, Respiratory Rate: 14 breaths/min. General Notes: Patient's daughter requested that I not take a blood pressure as the patient so easily gets skin tears and bruising. Huel Coventry, RN was made  aware. Respiratory normal breathing without difficulty. Psychiatric this patient is able to make decisions and demonstrates good insight into disease process. Alert and Oriented x 3. pleasant and cooperative. General Notes: Upon inspection patient's wounds again on the ear appear to be likely secondary to pressure and same thing is true the sacral region upon evaluation today. With that being said the remaining wounds all appear to be skin tears. This patient's skin is just so fragile that it is very difficult to even move her and reposition her without potentially causing skin tears. Even blood pressure cuffs apparently are causing some issues for her unfortunately. Integumentary (Hair, Skin) Wound #14 status is Open. Original cause of wound was Pressure Injury. The wound is located on the Toe Second. The wound measures 0.1cm length x 0.1cm width x 0.1cm depth; 0.008cm^2 area and 0.001cm^3 volume. There is Fat Layer (Subcutaneous Tissue) exposed. There is no tunneling or undermining noted. There is a none present amount of drainage noted. The wound margin is distinct with the outline attached to the wound base. There is no granulation within the wound bed. There is a large (67-100%) amount of necrotic tissue within the wound bed including Eschar. Wound #15 status is Open. Original cause  of wound was Skin Tear/Laceration. The wound is located on the Left Forearm. The wound measures 0cm length x 0cm width x 0cm depth; 0cm^2 area and 0cm^3 volume. There is no tunneling or undermining noted. There is a none present amount of drainage noted. There is no granulation within the wound bed. There is no necrotic tissue within the wound bed. Wound #16 status is Open. Original cause of wound was Pressure Injury. The wound is located on the Right Ear. The wound measures 0.1cm length x 0.1cm width x 0.1cm depth; 0.008cm^2 area and 0.001cm^3 volume. There is no tunneling or undermining noted. There is a  none present amount of drainage noted. There is no granulation within the wound bed. There is a large (67-100%) amount of necrotic tissue within the wound bed including Eschar. Wound #17 status is Open. Original cause of wound was Gradually Appeared. The wound is located on the Right Forearm. The wound measures 1.6cm length x 0.3cm width x 0.1cm depth; 0.377cm^2 area and 0.038cm^3 volume. There is no tunneling or undermining noted. There is a small amount of serosanguineous drainage noted. There is no granulation within the wound bed. There is a large (67-100%) amount of necrotic tissue within the wound bed including Adherent Slough. Wound #18 status is Open. Original cause of wound was Trauma. The wound is located on the Left Lower Leg. The wound measures 3.6cm length x 0.4cm width x 0.1cm depth; 1.131cm^2 area and 0.113cm^3 volume. There is Fat Layer (Subcutaneous Tissue) exposed. There is no tunneling or undermining noted. There is a medium amount of serosanguineous drainage noted. There is large (67-100%) pink granulation within the wound bed. There is no necrotic tissue within the wound bed. Wound #19 status is Open. Original cause of wound was Pressure Injury. The wound is located on the Sacrum. The wound measures 2.2cm length x 1.1cm width x 0.1cm depth; 1.901cm^2 area and 0.19cm^3 volume. There is no tunneling or undermining noted. There is a medium amount of serosanguineous drainage noted. There is medium (34-66%) pink, pale granulation within the wound bed. There is a medium (34-66%) amount of necrotic tissue within the wound bed including Adherent Slough. Assessment Active Problems ICD-10 Pressure ulcer of other site, stage 3 Pressure ulcer of sacral region, stage 3 Unspecified open wound of left forearm, initial encounter Pressure ulcer of other site, stage 2 Unspecified open wound of right forearm, initial encounter [name], [name] ([id number]) Parkinson's  disease Plan Follow-up Appointments: Return Appointment in 2 weeks. Off-Loading: Gel wheelchair cushion - or foam cushion Air fluidized (Group 3) Turn and reposition every 2 hours Additional Orders / Instructions: Follow Nutritious Diet Increase protein intake. Other: - Ducubivite per OTC /bottle instructions and DC centrium General Notes: ALL WOUND CARE ORDERS AS DIRECTED ABOVE AND PRN AS NEEDED FOR SOILAGE OR IF THEY BECOME LOOSE. WOUND #14: - Toe Second Wound Laterality: Cleanser: Normal Saline Every Other Day/ Discharge Instructions: Wash your hands with soap and water. Remove old dressing, discard into plastic bag and place into trash. Cleanse the wound with Normal Saline prior to applying a clean dressing using gauze sponges, not tissues or cotton balls. Do not scrub or use excessive force. Pat dry using gauze sponges, not tissue or cotton balls. Primary Dressing: Silvercel Small 2x2 (in/in) Every Other Day/ Discharge Instructions: Apply Silvercel Small 2x2 (in/in) as instructed Secondary Dressing: Mepilex Border Flex, 4x4 (in/in) Every Other Day/ Discharge Instructions: Apply to wound as directed. Do not cut. WOUND #16: - Ear Wound Laterality: Right Cleanser:  Normal Saline (Generic) Every Other Day/ Discharge Instructions: Wash your hands with soap and water. Remove old dressing, discard into plastic bag and place into trash. Cleanse the wound with Normal Saline prior to applying a clean dressing using gauze sponges, not tissues or cotton balls. Do not scrub or use excessive force. Pat dry using gauze sponges, not tissue or cotton balls. Primary Dressing: Xeroform-HBD 2x2 (in/in) (Generic) Every Other Day/ Discharge Instructions: Apply Xeroform-HBD 2x2 (in/in) as directed Secondary Dressing: Mepilex Border Flex, 4x4 (in/in) (Generic) Every Other Day/ Discharge Instructions: Apply to wound as directed. Do not cut. WOUND #17: - Forearm Wound Laterality: Right Cleanser: Normal  Saline (Generic) Every Other Day/ Discharge Instructions: Wash your hands with soap and water. Remove old dressing, discard into plastic bag and place into trash. Cleanse the wound with Normal Saline prior to applying a clean dressing using gauze sponges, not tissues or cotton balls. Do not scrub or use excessive force. Pat dry using gauze sponges, not tissue or cotton balls. Primary Dressing: Xeroform-HBD 2x2 (in/in) (Generic) Every Other Day/ Discharge Instructions: Apply Xeroform-HBD 2x2 (in/in) as directed Secondary Dressing: Mepilex Border Flex, 4x4 (in/in) (Generic) Every Other Day/ Discharge Instructions: Apply to wound as directed. Do not cut. WOUND #18: - Lower Leg Wound Laterality: Left Cleanser: Normal Saline (Generic) Every Other Day/ Discharge Instructions: Wash your hands with soap and water. Remove old dressing, discard into plastic bag and place into trash. Cleanse the wound with Normal Saline prior to applying a clean dressing using gauze sponges, not tissues or cotton balls. Do not scrub or use excessive force. Pat dry using gauze sponges, not tissue or cotton balls. Primary Dressing: Xeroform-HBD 2x2 (in/in) (Generic) Every Other Day/ Discharge Instructions: Apply Xeroform-HBD 2x2 (in/in) as directed Secondary Dressing: Mepilex Border Flex, 4x4 (in/in) (Generic) Every Other Day/ Discharge Instructions: Apply to wound as directed. Do not cut. WOUND #19: - Sacrum Wound Laterality: Cleanser: Normal Saline Every Other Day/ Discharge Instructions: Wash your hands with soap and water. Remove old dressing, discard into plastic bag and place into trash. Cleanse the wound with Normal Saline prior to applying a clean dressing using gauze sponges, not tissues or cotton balls. Do not scrub or use excessive force. Pat dry using gauze sponges, not tissue or cotton balls. Primary Dressing: Prisma 4.34 (in) (Generic) Every Other Day/ Discharge Instructions: Moisten w/normal saline or  sterile water; Cover wound as directed. Do not remove from wound bed. Secondary Dressing: Mepilex Border Flex, 4x4 (in/in) (Generic) Every Other Day/ Discharge Instructions: Apply to wound as directed. Do not cut. 1. Would recommend at this point that we actually initiate treatment with silver collagen for the sacral area. I am also can recommend continuation of the silver alginate for the toe which seems to be doing better. In regard to the skin tears were written can use Xeroform over these regions and the ear will be a silver collagen as well. 2. I am also can recommend at this time that we have the patient continue with a air-fluidized mattress I think that still good. 3. I would also recommend she needs to be turned and repositioned every 2 hours in order to keep from causing any new pressure injuries. They initially need to be also very cautious with her ear area and what is going on there. MA, OURSLER ([id number]) 4. I would also recommend increase protein intake which they are doing and I think Decubivite would also be beneficial for her per the over-the- counter instructions on the  bottle. Discontinue the Centrum Silver at this point. We will see patient back for reevaluation in 2 weeks here in the clinic. If anything worsens or changes patient will contact our office for additional recommendations. Electronic Signature(s) Signed: 10/04/2020 4:05:20 PM By: Lenda Kelp PA-C Entered By: Lenda Kelp on 10/04/2020 16:05:19 [name] ([id number]) -------------------------------------------------------------------------------- SuperBill Details Patient Name: [name] Date of Service: 10/04/2020 Medical Record Number: [id number] Patient Account Number: [id number] Date of Birth/Sex: [birth year] (87 y.o. F) Treating RN: Huel Coventry Primary Care Provider: Aura Dials Other Clinician: Referring Provider: Aura Dials Treating Provider/Extender: Rowan Blase  in Treatment: 4 Diagnosis Coding ICD-10 Codes Code Description [phone] Pressure ulcer of other site, stage 3 L89.153 Pressure ulcer of sacral region, stage 3 S51.802A Unspecified open wound of left forearm, initial encounter L89.892 Pressure ulcer of other site, stage 2 S51.801A Unspecified open wound of right forearm, initial encounter G20 Parkinson's disease Facility Procedures CPT4 Code: [id number] Description: [phone] - WOUND CARE VISIT-LEV 5 EST PT Modifier: Quantity: 1 Physician Procedures CPT4 Code: [id number] Description: 99214 - WC PHYS LEVEL 4 - EST PT Modifier: Quantity: 1 CPT4 Code: Description: ICD-10 Diagnosis Description L89.893 Pressure ulcer of other site, stage 3 L89.153 Pressure ulcer of sacral region, stage 3 S51.802A Unspecified open wound of left forearm, initial encounter L89.892 Pressure ulcer of other site, stage 2 Modifier: Quantity: Electronic Signature(s) Signed: 10/05/2020 4:17:09 PM By: Lenda Kelp PA-C Signed: 10/05/2020 4:22:48 PM By: Yevonne Pax RN Previous Signature: 10/04/2020 4:05:53 PM Version By: Lenda Kelp PA-C Entered By: Yevonne Pax on 10/04/2020 16:56:58

## 2020-10-05 NOTE — Progress Notes (Addendum)
Tina MainlandSHAW, Rozalynn W. ([id number]) Visit Report for 10/04/2020 Arrival Information Details Patient Name: Tina MainlandSHAW, [name] Date of Service: 10/04/2020 12:30 PM Medical Record Number: [id number] Patient Account Number: [id number] Date of Birth/Sex: 05/21/1923 (87 y.o. Female) Treating RN: Huel CoventryWoody, Kim Primary Care Eustace Hur: Aura Dialsannady, [name] Other Clinician: Referring Aarik Blank: Aura Dialsannady, [name] Treating Deysha Cartier/Extender: Rowan BlaseStone, Hoyt Weeks in Treatment: 4 Visit Information History Since Last Visit Added or deleted any medications: No Patient Arrived: Wheel Chair Any new allergies or adverse reactions: No Arrival Time: 12:46 Had a fall or experienced change in No Accompanied By: daughter activities of daily living that may affect Transfer Assistance: Manual risk of falls: Patient Identification Verified: Yes Signs or symptoms of abuse/neglect since last visito No Secondary Verification Process Completed: Yes Hospitalized since last visit: No Implantable device outside of the clinic excluding No cellular tissue based products placed in the center since last visit: Has Dressing in Place as Prescribed: Yes Pain Present Now: No Electronic Signature(s) Signed: 10/04/2020 4:51:21 PM By: Dayton MartesWallace, RCP,RRT,CHT, Sallie RCP, RRT, CHT Entered By: Dayton MartesWallace, RCP,RRT,CHT, Sallie on 10/04/2020 12:48:05 Tina MainlandSHAW, Aramis W. ([id number]) -------------------------------------------------------------------------------- Clinic Level of Care Assessment Details Patient Name: Tina MainlandSHAW, [name] Date of Service: 10/04/2020 12:30 PM Medical Record Number: [id number] Patient Account Number: [id number] Date of Birth/Sex: 05/21/1923 (87 y.o. Female) Treating RN: Yevonne PaxEpps, Carrie Primary Care Ronnika Collett: Aura Dialsannady, [name] Other Clinician: Referring Fianna Snowball: Aura Dialsannady, [name] Treating Reinhart Saulters/Extender: Rowan BlaseStone, Hoyt Weeks in Treatment: 4 Clinic Level of Care Assessment Items TOOL 4 Quantity Score []  - Use when only an EandM is performed  on FOLLOW-UP visit 0 ASSESSMENTS - Nursing Assessment / Reassessment X - Reassessment of Co-morbidities (includes updates in patient status) 1 10 X- 1 5 Reassessment of Adherence to Treatment Plan ASSESSMENTS - Wound and Skin Assessment / Reassessment []  - Simple Wound Assessment / Reassessment - one wound 0 X- 6 5 Complex Wound Assessment / Reassessment - multiple wounds []  - 0 Dermatologic / Skin Assessment (not related to wound area) ASSESSMENTS - Focused Assessment []  - Circumferential Edema Measurements - multi extremities 0 []  - 0 Nutritional Assessment / Counseling / Intervention []  - 0 Lower Extremity Assessment (monofilament, tuning fork, pulses) []  - 0 Peripheral Arterial Disease Assessment (using hand held doppler) ASSESSMENTS - Ostomy and/or Continence Assessment and Care []  - Incontinence Assessment and Management 0 []  - 0 Ostomy Care Assessment and Management (repouching, etc.) PROCESS - Coordination of Care X - Simple Patient / Family Education for ongoing care 1 15 []  - 0 Complex (extensive) Patient / Family Education for ongoing care []  - 0 Staff obtains ChiropractorConsents, Records, Test Results / Process Orders X- 1 10 Staff telephones HHA, Nursing Homes / Clarify orders / etc []  - 0 Routine Transfer to another Facility (non-emergent condition) []  - 0 Routine Hospital Admission (non-emergent condition) []  - 0 New Admissions / Manufacturing engineernsurance Authorizations / Ordering NPWT, Apligraf, etc. []  - 0 Emergency Hospital Admission (emergent condition) X- 1 10 Simple Discharge Coordination []  - 0 Complex (extensive) Discharge Coordination PROCESS - Special Needs []  - Pediatric / Minor Patient Management 0 []  - 0 Isolation Patient Management []  - 0 Hearing / Language / Visual special needs []  - 0 Assessment of Community assistance (transportation, D/C planning, etc.) []  - 0 Additional assistance / Altered mentation []  - 0 Support Surface(s) Assessment (bed, cushion,  seat, etc.) INTERVENTIONS - Wound Cleansing / Measurement Tina MainlandSHAW, [name] ([id number]) []  - 0 Simple Wound Cleansing - one wound X- 6 5 Complex Wound Cleansing - multiple wounds []  - 0  Wound Imaging (photographs - any number of wounds) X- 1 5 Wound Tracing (instead of photographs) []  - 0 Simple Wound Measurement - one wound X- 6 5 Complex Wound Measurement - multiple wounds INTERVENTIONS - Wound Dressings X - Small Wound Dressing one or multiple wounds 6 10 []  - 0 Medium Wound Dressing one or multiple wounds []  - 0 Large Wound Dressing one or multiple wounds X- 1 5 Application of Medications - topical []  - 0 Application of Medications - injection INTERVENTIONS - Miscellaneous []  - External ear exam 0 []  - 0 Specimen Collection (cultures, biopsies, blood, body fluids, etc.) []  - 0 Specimen(s) / Culture(s) sent or taken to Lab for analysis []  - 0 Patient Transfer (multiple staff / / Similar devices) []  - 0 Simple Staple / Suture removal (25 or less) []  - 0 Complex Staple / Suture removal (26 or more) []  - 0 Hypo / Hyperglycemic Management (close monitor of Blood Glucose) []  - 0 Ankle / Brachial Index (ABI) - do not check if billed separately X- 1 5 Vital Signs Has the patient been seen at the hospital within the last three years: Yes Total Score: 215 Level Of Care: New/Established - Level 5 Electronic Signature(s) Signed: 10/05/2020 4:22:48 PM By: RN Entered By: on 10/04/2020 16:56:49 ( ) -------------------------------------------------------------------------------- Encounter Discharge Information Details Patient Name: Date of Service: 10/04/2020 12:30 PM Medical Record Number: Patient Account Number: Date of Birth/Sex: 21-Mar-1923 (87 y.o. Female) Treating RN: 12/03/2020 Primary Care Blake Vetrano: Yevonne Pax Other Clinician: Referring Vincenzina Jagoda: Yevonne Pax Treating Aishia Barkey/Extender: 12/02/2020 in Treatment: 4 Encounter Discharge Information Items Discharge Condition: Stable Ambulatory Status: Wheelchair Discharge Destination: Home Transportation: Private Auto Accompanied By: daughter Schedule Follow-up Appointment: Yes Clinical Summary of Care: Patient Declined Electronic Signature(s) Signed: 10/05/2020 4:22:48 PM By: [id number] RN Entered By: [name] on 10/04/2020 17:02:08 [id number] ([id number]) -------------------------------------------------------------------------------- Multi Wound Chart Details Patient Name: 05/10/1923 Date of Service: 10/04/2020 12:30 PM Medical Record Number: Yevonne Pax Patient Account Number: Aura Dials Date of Birth/Sex: Oct 31, 1922 (87 y.o. Female) Treating RN: 12/03/2020 Primary Care Suzzane Quilter: Yevonne Pax Other Clinician: Referring Kailany Dinunzio: Yevonne Pax Treating Emilya Justen/Extender: 12/02/2020 in Treatment: 4 Vital Signs Height(in): 60 Pulse(bpm): 66 Weight(lbs): 95 Blood Pressure(mmHg): Body Mass Index(BMI): 19 Temperature(F): 97.6 Respiratory Rate(breaths/min): 14 Photos: Wound Location: Toe Second Left Forearm Right Ear Wounding Event: Pressure Injury Skin Tear/Laceration Pressure Injury Primary Etiology: Pressure Ulcer Skin Tear Pressure Ulcer Comorbid History: Anemia, Hypertension, Dementia Anemia, Hypertension, Dementia Anemia, Hypertension, Dementia Date Acquired: 08/23/2020 09/05/2020 09/07/2020 Weeks of Treatment: 4 3 3  Wound Status: Open Open Open Measurements L x W x D (cm) 0.1x0.1x0.1 0x0x0 0.1x0.1x0.1 Area (cm) : 0.008 0 0.008 Volume (cm) : 0.001 0 0.001 % Reduction in Area: 97.60% 100.00% 94.30% % Reduction in Volume: 99.00% 100.00% 92.90% Classification: Category/Stage IV Full Thickness Without Exposed Category/Stage II Support Structures Exudate Amount: None Present None Present None Present Exudate Type: N/A N/A N/A Exudate  Color: N/A N/A N/A Wound Margin: Distinct, outline attached N/A N/A Granulation Amount: None Present (0%) None Present (0%) None Present (0%) Granulation Quality: N/A N/A N/A Necrotic Amount: Large (67-100%) None Present (0%) Large (67-100%) Necrotic Tissue: Eschar N/A Eschar Exposed Structures: Fat Layer (Subcutaneous Tissue): Fascia: No Fascia: No Yes Fat Layer (Subcutaneous Tissue): Fat Layer (Subcutaneous Tissue): Fascia: No No No Tendon: No Tendon: No Tendon: No Muscle: No Muscle: No Muscle: No Joint: No  Joint: No Joint: No Bone: No Bone: No Bone: No Epithelialization: Medium (34-66%) Large (67-100%) None Wound Number: [id number]  Photos: No Photos SHANETTE, TAMARGO ([name]) Wound Location: Right Forearm Left Lower Leg Sacrum Wounding Event: Gradually Appeared Trauma Pressure Injury Primary Etiology: Skin Tear Skin Tear Pressure Ulcer Comorbid History: Anemia, Hypertension, Dementia Anemia, Hypertension, Dementia Anemia, Hypertension, Dementia Date Acquired: 09/29/2020 09/29/2020 09/29/2020 Weeks of Treatment: 0 0 0 Wound Status: Open Open Open Measurements L x W x D (cm) 1.6x0.3x0.1 3.6x0.4x0.1 2.2x1.1x0.1 Area (cm) : 0.377 1.131 1.901 Volume (cm) : 0.038 0.113 0.19 % Reduction in Area: N/A N/A N/A % Reduction in Volume: N/A N/A N/A Classification: Full Thickness Without Exposed Full Thickness Without Exposed Category/Stage III Support Structures Support Structures Exudate Amount: Small Medium Medium Exudate Type: Serosanguineous Serosanguineous Serosanguineous Exudate Color: red, brown red, brown red, brown Wound Margin: N/A N/A N/A Granulation Amount: None Present (0%) Large (67-100%) Medium (34-66%) Granulation Quality: N/A Pink Pink, Pale Necrotic Amount: Large (67-100%) None Present (0%) Medium (34-66%) Necrotic Tissue: Adherent Slough N/A Adherent Slough Exposed Structures: Fascia: No Fat Layer (Subcutaneous Tissue): N/A Fat Layer (Subcutaneous Tissue):  Yes No Fascia: No Tendon: No Tendon: No Muscle: No Muscle: No Joint: No Joint: No Bone: No Bone: No Epithelialization: None None None Treatment Notes Electronic Signature(s) Signed: 10/05/2020 4:22:48 PM By: 12/03/2020 RN Entered By: Yevonne Pax on 10/04/2020 13:40:32 12/02/2020 ([name]) -------------------------------------------------------------------------------- Multi-Disciplinary Care Plan Details Patient Name: [id number] Date of Service: 10/04/2020 12:30 PM Medical Record Number: 12/02/2020 Patient Account Number: [id number] Date of Birth/Sex: 1923-02-26 (87 y.o. Female) Treating RN: 06-08-2001 Primary Care Janann Boeve: Yevonne Pax Other Clinician: Referring Pammy Vesey: Aura Dials Treating Nicholson Starace/Extender: Aura Dials in Treatment: 4 Active Inactive Electronic Signature(s) Signed: 11/13/2020 8:33:22 AM By: 11/15/2020 BSN, RN, CWS, Kim RN, BSN Signed: 11/22/2020 11:29:20 AM By: 11/24/2020 RN Previous Signature: 10/05/2020 4:22:48 PM Version By: 12/03/2020 RN Entered By: Yevonne Pax BSN, RN, CWS, Kim on 11/13/2020 08:33:22 11/15/2020 ([name]) -------------------------------------------------------------------------------- Pain Assessment Details Patient Name: [id number] Date of Service: 10/04/2020 12:30 PM Medical Record Number: 12/02/2020 Patient Account Number: [id number] Date of Birth/Sex: 04-11-1923 (87 y.o. Female) Treating RN: 06-08-2001 Primary Care Heriberto Stmartin: Yevonne Pax Other Clinician: Referring Ferrah Panagopoulos: Aura Dials Treating Bridgitt Raggio/Extender: Aura Dials in Treatment: 4 Active Problems Location of Pain Severity and Description of Pain Patient Has Paino No Site Locations Pain Management and Medication Current Pain Management: Electronic Signature(s) Signed: 10/05/2020 4:22:48 PM By: 12/03/2020 RN Entered By: Yevonne Pax on 10/04/2020 12:59:11 12/02/2020  ([name]) -------------------------------------------------------------------------------- Patient/Caregiver Education Details Patient Name: [id number] Date of Service: 10/04/2020 12:30 PM Medical Record Number: 12/02/2020 Patient Account Number: [id number] Date of Birth/Gender: [birth year] (87 y.o. Female) Treating RN: 06-08-2001 Primary Care Physician: Yevonne Pax Other Clinician: Referring Physician: Aura Dials Treating Physician/Extender: Aura Dials in Treatment: 4 Education Assessment Education Provided To: Patient Education Topics Provided Wound/Skin Impairment: Methods: Explain/Verbal Responses: State content correctly Electronic Signature(s) Signed: 10/05/2020 4:22:48 PM By: 12/03/2020 RN Entered By: Yevonne Pax on 10/04/2020 16:57:10 12/02/2020 ([name]) -------------------------------------------------------------------------------- Wound Assessment Details Patient Name: [id number] Date of Service: 10/04/2020 12:30 PM Medical Record Number: 12/02/2020 Patient Account Number: [id number] Date of Birth/Sex: Jan 17, 1923 (87 y.o. Female) Treating RN: 06-08-2001 Primary Care James Lafalce: Yevonne Pax Other Clinician: Referring Fontaine Hehl: Aura Dials Treating Buell Parcel/Extender: Aura Dials in Treatment: 4 Wound Status Wound Number: 14 Primary Etiology: Pressure Ulcer Wound Location: Toe  Second Wound Status: Open Wounding Event: Pressure Injury Comorbid History: Anemia, Hypertension, Dementia Date Acquired: 08/23/2020 Weeks Of Treatment: 4 Clustered Wound: No Photos Wound Measurements Length: (cm) 0.1 Width: (cm) 0.1 Depth: (cm) 0.1 Area: (cm) 0.008 Volume: (cm) 0.001 % Reduction in Area: 97.6% % Reduction in Volume: 99% Epithelialization: Medium (34-66%) Tunneling: No Undermining: No Wound Description Classification: Category/Stage IV Wound Margin: Distinct, outline attached Exudate Amount: None Present Foul  Odor After Cleansing: No Slough/Fibrino Yes Wound Bed Granulation Amount: None Present (0%) Exposed Structure Necrotic Amount: Large (67-100%) Fascia Exposed: No Necrotic Quality: Eschar Fat Layer (Subcutaneous Tissue) Exposed: Yes Tendon Exposed: No Muscle Exposed: No Joint Exposed: No Bone Exposed: No Electronic Signature(s) Signed: 10/05/2020 4:22:48 PM By: Yevonne Pax RN Entered By: Yevonne Pax on 10/04/2020 13:25:33 [name] ([id number]) -------------------------------------------------------------------------------- Wound Assessment Details Patient Name: [name] Date of Service: 10/04/2020 12:30 PM Medical Record Number: [id number] Patient Account Number: [id number] Date of Birth/Sex: October 21, 1922 (87 y.o. Female) Treating RN: Yevonne Pax Primary Care Everest Brod: Aura Dials Other Clinician: Referring Erikson Danzy: Aura Dials Treating Naija Troost/Extender: Rowan Blase in Treatment: 4 Wound Status Wound Number: 15 Primary Etiology: Skin Tear Wound Location: Left Forearm Wound Status: Open Wounding Event: Skin Tear/Laceration Comorbid History: Anemia, Hypertension, Dementia Date Acquired: 09/05/2020 Weeks Of Treatment: 3 Clustered Wound: No Photos Wound Measurements Length: (cm) 0 Width: (cm) 0 Depth: (cm) 0 Area: (cm) 0 Volume: (cm) 0 % Reduction in Area: 100% % Reduction in Volume: 100% Epithelialization: Large (67-100%) Tunneling: No Undermining: No Wound Description Classification: Full Thickness Without Exposed Support Structure Exudate Amount: None Present s Foul Odor After Cleansing: No Slough/Fibrino No Wound Bed Granulation Amount: None Present (0%) Exposed Structure Necrotic Amount: None Present (0%) Fascia Exposed: No Fat Layer (Subcutaneous Tissue) Exposed: No Tendon Exposed: No Muscle Exposed: No Joint Exposed: No Bone Exposed: No Electronic Signature(s) Signed: 10/05/2020 4:22:48 PM By: Yevonne Pax RN Entered By:  Yevonne Pax on 10/04/2020 13:28:44 [name] ([id number]) -------------------------------------------------------------------------------- Wound Assessment Details Patient Name: [name] Date of Service: 10/04/2020 12:30 PM Medical Record Number: [id number] Patient Account Number: [id number] Date of Birth/Sex: 05-08-1923 (87 y.o. Female) Treating RN: Yevonne Pax Primary Care Kimball Manske: Aura Dials Other Clinician: Referring Ame Heagle: Aura Dials Treating Camreigh Michie/Extender: Rowan Blase in Treatment: 4 Wound Status Wound Number: 16 Primary Etiology: Pressure Ulcer Wound Location: Right Ear Wound Status: Open Wounding Event: Pressure Injury Comorbid History: Anemia, Hypertension, Dementia Date Acquired: 09/07/2020 Weeks Of Treatment: 3 Clustered Wound: No Photos Wound Measurements Length: (cm) 0.1 Width: (cm) 0.1 Depth: (cm) 0.1 Area: (cm) 0.008 Volume: (cm) 0.001 % Reduction in Area: 94.3% % Reduction in Volume: 92.9% Epithelialization: None Tunneling: No Undermining: No Wound Description Classification: Category/Stage II Exudate Amount: None Present Foul Odor After Cleansing: No Slough/Fibrino Yes Wound Bed Granulation Amount: None Present (0%) Exposed Structure Necrotic Amount: Large (67-100%) Fascia Exposed: No Necrotic Quality: Eschar Fat Layer (Subcutaneous Tissue) Exposed: No Tendon Exposed: No Muscle Exposed: No Joint Exposed: No Bone Exposed: No Electronic Signature(s) Signed: 10/05/2020 4:22:48 PM By: Yevonne Pax RN Entered By: Yevonne Pax on 10/04/2020 13:27:08 [name] ([id number]) -------------------------------------------------------------------------------- Wound Assessment Details Patient Name: [name] Date of Service: 10/04/2020 12:30 PM Medical Record Number: [id number] Patient Account Number: [id number] Date of Birth/Sex: 02-21-1923 (87 y.o. Female) Treating RN: Yevonne Pax Primary Care Lallie Strahm:  Aura Dials Other Clinician: Referring Naser Schuld: Aura Dials Treating Zanaiya Calabria/Extender: Rowan Blase in Treatment: 4 Wound Status Wound Number: 17 Primary Etiology: Skin Tear Wound Location:  Right Forearm Wound Status: Open Wounding Event: Gradually Appeared Comorbid History: Anemia, Hypertension, Dementia Date Acquired: 09/29/2020 Weeks Of Treatment: 0 Clustered Wound: No Wound Measurements Length: (cm) 1.6 Width: (cm) 0.3 Depth: (cm) 0.1 Area: (cm) 0.377 Volume: (cm) 0.038 % Reduction in Area: % Reduction in Volume: Epithelialization: None Tunneling: No Undermining: No Wound Description Classification: Full Thickness Without Exposed Support Structu Exudate Amount: Small Exudate Type: Serosanguineous Exudate Color: red, brown res Foul Odor After Cleansing: No Slough/Fibrino Yes Wound Bed Granulation Amount: None Present (0%) Exposed Structure Necrotic Amount: Large (67-100%) Fascia Exposed: No Necrotic Quality: Adherent Slough Fat Layer (Subcutaneous Tissue) Exposed: No Tendon Exposed: No Muscle Exposed: No Joint Exposed: No Bone Exposed: No Electronic Signature(s) Signed: 10/05/2020 4:22:48 PM By: Yevonne Pax RN Entered By: Yevonne Pax on 10/04/2020 13:19:12 [name] ([id number]) -------------------------------------------------------------------------------- Wound Assessment Details Patient Name: [name] Date of Service: 10/04/2020 12:30 PM Medical Record Number: [id number] Patient Account Number: [id number] Date of Birth/Sex: 20-Jul-1923 (87 y.o. Female) Treating RN: Yevonne Pax Primary Care Shanedra Lave: Aura Dials Other Clinician: Referring Amed Datta: Aura Dials Treating Divonte Senger/Extender: Rowan Blase in Treatment: 4 Wound Status Wound Number: 18 Primary Etiology: Skin Tear Wound Location: Left Lower Leg Wound Status: Open Wounding Event: Trauma Comorbid History: Anemia, Hypertension, Dementia Date Acquired:  09/29/2020 Weeks Of Treatment: 0 Clustered Wound: No Photos Wound Measurements Length: (cm) 3.6 Width: (cm) 0.4 Depth: (cm) 0.1 Area: (cm) 1.131 Volume: (cm) 0.113 % Reduction in Area: % Reduction in Volume: Epithelialization: None Tunneling: No Undermining: No Wound Description Classification: Full Thickness Without Exposed Support Structu Exudate Amount: Medium Exudate Type: Serosanguineous Exudate Color: red, brown res Foul Odor After Cleansing: No Slough/Fibrino No Wound Bed Granulation Amount: Large (67-100%) Exposed Structure Granulation Quality: Pink Fascia Exposed: No Necrotic Amount: None Present (0%) Fat Layer (Subcutaneous Tissue) Exposed: Yes Tendon Exposed: No Muscle Exposed: No Joint Exposed: No Bone Exposed: No Electronic Signature(s) Signed: 10/05/2020 4:22:48 PM By: Yevonne Pax RN Entered By: Yevonne Pax on 10/04/2020 13:21:29 [name] ([id number]) -------------------------------------------------------------------------------- Wound Assessment Details Patient Name: [name] Date of Service: 10/04/2020 12:30 PM Medical Record Number: [id number] Patient Account Number: [id number] Date of Birth/Sex: 10/27/1922 (87 y.o. Female) Treating RN: Yevonne Pax Primary Care Texanna Hilburn: Aura Dials Other Clinician: Referring Mylan Lengyel: Aura Dials Treating Riki Gehring/Extender: Rowan Blase in Treatment: 4 Wound Status Wound Number: 19 Primary Etiology: Pressure Ulcer Wound Location: Sacrum Wound Status: Open Wounding Event: Pressure Injury Comorbid History: Anemia, Hypertension, Dementia Date Acquired: 09/29/2020 Weeks Of Treatment: 0 Clustered Wound: No Photos Wound Measurements Length: (cm) 2.2 Width: (cm) 1.1 Depth: (cm) 0.1 Area: (cm) 1.901 Volume: (cm) 0.19 % Reduction in Area: % Reduction in Volume: Epithelialization: None Tunneling: No Undermining: No Wound Description Classification: Category/Stage III Exudate  Amount: Medium Exudate Type: Serosanguineous Exudate Color: red, brown Foul Odor After Cleansing: No Slough/Fibrino Yes Wound Bed Granulation Amount: Medium (34-66%) Granulation Quality: Pink, Pale Necrotic Amount: Medium (34-66%) Necrotic Quality: Adherent Slough Electronic Signature(s) Signed: 10/05/2020 4:22:48 PM By: Yevonne Pax RN Entered By: Yevonne Pax on 10/04/2020 13:23:45 [name] ([id number]) -------------------------------------------------------------------------------- Vitals Details Patient Name: [name] Date of Service: 10/04/2020 12:30 PM Medical Record Number: [id number] Patient Account Number: [id number] Date of Birth/Sex: Mar 15, 1923 (87 y.o. Female) Treating RN: Huel Coventry Primary Care Laurrie Toppin: Aura Dials Other Clinician: Referring Patsie Mccardle: Aura Dials Treating Vernice Bowker/Extender: Rowan Blase in Treatment: 4 Vital Signs Time Taken: 12:48 Temperature (F): 97.6 Height (in): 60 Pulse (bpm): 66 Weight (lbs): 95 Respiratory Rate (breaths/min):  14 Body Mass Index (BMI): 18.6 Reference Range: 80 - 120 mg / dl Notes Patient's daughter requested that I not take a blood pressure as the patient so easily gets skin tears and bruising. Cornell Barman, RN was made aware. Electronic Signature(s) Signed: 10/04/2020 4:51:21 PM By: Lorine Bears RCP, RRT, CHT Entered By: Lorine Bears on 10/04/2020 12:55:51

## 2020-10-11 ENCOUNTER — Encounter: Payer: Medicare Other | Admitting: Primary Care

## 2020-10-19 ENCOUNTER — Other Ambulatory Visit: Payer: Self-pay

## 2020-10-19 ENCOUNTER — Non-Acute Institutional Stay: Payer: Medicare Other | Admitting: Primary Care

## 2020-10-19 ENCOUNTER — Ambulatory Visit: Payer: Medicare Other | Admitting: Physician Assistant

## 2020-10-19 DIAGNOSIS — R5381 Other malaise: Secondary | ICD-10-CM

## 2020-10-19 DIAGNOSIS — G20 Parkinson's disease: Secondary | ICD-10-CM

## 2020-10-19 DIAGNOSIS — R413 Other amnesia: Secondary | ICD-10-CM

## 2020-10-19 DIAGNOSIS — L89153 Pressure ulcer of sacral region, stage 3: Secondary | ICD-10-CM

## 2020-10-19 DIAGNOSIS — G20A1 Parkinson's disease without dyskinesia, without mention of fluctuations: Secondary | ICD-10-CM

## 2020-10-19 NOTE — Progress Notes (Signed)
Therapist, nutritional Palliative Care Consult Note Telephone: [phone]  Fax: [phone]   TELEHEALTH VISIT STATEMENT Due to the COVID-19 crisis, this visit was done via telemedicine from my office. It was initiated and consented to by this patient and/or family.  Date of encounter: 10/19/20 PATIENT NAME: [name] [address] [place] [address] [phone] (home)  DOB: [birth year] MRN: [id number]  PRIMARY CARE PROVIDER:    Salem Senate FNP [facility]  REFERRING PROVIDER:   Salem Senate FNP [facility]   RESPONSIBLE PARTY:   Extended Emergency Contact Information Primary Emergency Contact: [facility] Address: [address]          [place], [place] [address] Darden Amber of [place] Home Phone: [phone] Relation: Daughter Secondary Emergency Contact: [facility] Address: [address]          [place], [place] [address] Darden Amber of [place] Home Phone: [phone] Relation: Daughter  Palliative Care was asked to follow this patient by consultation request of Salem Senate FNP to help address advance care planning and goals of care. This is a follow up visit.Involved in today's telemedicine visit was [facility] employees [place], Charity fundraiser, and in a separate call daughters [name] and Concha Pyo.   ASSESSMENT AND RECOMMENDATIONS:   1. Advance Care Planning/Goals of Care: Goals include to maximize quality of life and symptom management. Our advance care planning conversation included a discussion about:     The value and importance of advance care planning    Exploration of personal, cultural or spiritual beliefs that might influence medical decisions   Exploration of goals of care in the event of a sudden injury or illness   Review of an  advance directive document .  Discussed patient's ongoing wound eruptions and debility.Outlined possible courses such as disease progression and  debility, decreased PO intake and skin failure. Family feels they want to afford her every opportunity to get better. We discussed at length hospice services and philosophies, available services, reimbursement schematics, wound care management under hospice. We discusses supportive/comfort vs rehabilitative complex medical goals. We discussed same with home health services as rehabilitative oriented for shorter time period and would teach caregivers in place wound care.   Family feels they would like to pursue wound management and give patient opportunity to heal. Patient has had wounds in the past which have healed over time with protein supplementation and wound treatments. We discussed possible changes in current disease processes due to disease progression, recent covid infection and other stresses. I have asked them to f/u with Dr. Larina Bras at wound management for a discussion about wound care goals. Family relays pt has had successful response to keflex for osteomyelitis dx a month to 6 weeks ago.   2. Symptom Management Recommendations:  Wounds: Wounds are progressing but protein intake appears to be below level to heal. Staff reports 25-50% Recommend 30 gm protein supplement tid. Seen by wound care clinic, Dr Larina Bras.  I defer to him for wound management protocol while she is open to wound management. Family willing to help with wound care. Family will clarify goals for wound healing and appropriate goals of care. RTC 11/09/20.  Home health: PCP is going to order home health. Please order local home health service.  Nutrition: 30 gm boost or similar tid. For wound healing, please offer. Please position in semi fowlers for feeding. Patient may be self  limiting intake due to dysphagia.   Covid infection f/u:  Day 10 is today and she was not reported symptomatic. Fully vaccinated with booster. Isolation should be finished per CDC guidelines.   3. Follow up Palliative Care Visit: Palliative care will  continue to follow for goals of care clarification and symptom management. Return 1-2 weeks or prn.  4. Family /Caregiver/Community Supports: new resident in [facility],daughters are POA.  5. Cognitive / Functional decline: Can open eyes, non verbal. Dependent in all adls and iadls.  I spent 100 minutes providing this consultation,  from 1000 to 1140. More than 50% of the time in this consultation was spent coordinating communication.   CODE STATUS: DNR MOST with comfort measures, limited abx, no iv, no feeding tube.  PPS: weak 30%  HOSPICE ELIGIBILITY/DIAGNOSIS: yes/parkinsons, protein calorie malnutrition, multiple wounds, Parkinson's dementia.  Subjective:  CHIEF COMPLAINT: wounds  HISTORY OF PRESENT ILLNESS:  [name] is a 87 y.o. year old female  with FTT, PD, immobility, multiple wounds, skin fragility. She has recently relocated from her home to family care home. 10 days ago she contracted Covid 19; she is fully vaccinated. Currently presents with new and increasing wounds and skin tears. Patient po protein take appears to be under amount for wound healing. In the past she has been able to take additional intake via supplements. Family and staff are concerned about wound care healing and dressing change protocols.  We are asked to consult around advance care planning and complex medical decision making.    Review and summarization of old Epic records shows or history from other than patient. Review or lab tests, radiology,  or medicine  Reviewed for Xray and  Review of case with family member.  History obtained from review of EMR, discussion with primary team, and  interview with family, caregiver  and/or Ms. Clelia Croft. Records reviewed and summarized above.   CURRENT PROBLEM LIST:  Patient Active Problem List   Diagnosis Date Noted  . Aortic atherosclerosis (HCC) 09/22/2020  . UTI symptoms 02/20/2020  . Tinea corporis 03/31/2019  . Debility 03/07/2019  . Memory deficits  03/07/2019  . Palliative care encounter 03/07/2019  . Protein-calorie malnutrition, severe 11/10/2017  . Pressure injury of skin 11/09/2017  . Parkinson's disease (HCC) 12/16/2015  . Dysphagia 12/16/2015  . B12 deficiency 10/29/2015   PAST MEDICAL HISTORY:  Active Ambulatory Problems    Diagnosis Date Noted  . B12 deficiency 10/29/2015  . Parkinson's disease (HCC) 12/16/2015  . Dysphagia 12/16/2015  . Pressure injury of skin 11/09/2017  . Protein-calorie malnutrition, severe 11/10/2017  . Debility 03/07/2019  . Memory deficits 03/07/2019  . Palliative care encounter 03/07/2019  . Tinea corporis 03/31/2019  . UTI symptoms 02/20/2020  . Aortic atherosclerosis (HCC) 09/22/2020   Resolved Ambulatory Problems    Diagnosis Date Noted  . Hypertension   . Tremor 07/04/2015  . Traumatic closed displaced fracture of multiple ribs 10/29/2015  . Confusion 11/12/2015  . Altered mental status 12/16/2015  . Abnormal CXR 12/16/2015  . Pneumonia 12/17/2015  . Perimenopausal atrophic vaginitis 03/03/2016  . Cloudy urine 07/16/2016  . Bedsore 12/18/2016  . Sepsis (HCC) 11/08/2017  . Constipation 03/10/2018  . UTI (urinary tract infection) 02/07/2019   Past Medical History:  Diagnosis Date  . Anemia   . Arm fracture 03/2016  . Arthritis   . Mild dementia (HCC)   . Parkinson disease (HCC)    SOCIAL HX:  Social History   Tobacco Use  . Smoking status: Never Smoker  . Smokeless tobacco: Never Used  Substance Use  Topics  . Alcohol use: No    Alcohol/week: 0.0 standard drinks   FAMILY HX:  Family History  Family history unknown: Yes      ALLERGIES:  Allergies  Allergen Reactions  . Phenergan [Promethazine Hcl] Other (See Comments)    Family states the patient almost went into a coma.  . Sulfa Antibiotics Other (See Comments)    Unknown reaction  . Sulfasalazine Other (See Comments)    Unknown reaction     PERTINENT MEDICATIONS:  Outpatient Encounter Medications as of  10/19/2020  Medication Sig  . cephALEXin (KEFLEX) 500 MG capsule Take 500 mg by mouth 4 (four) times daily.  Marland Kitchen acetaminophen (TYLENOL) 325 MG tablet Take 650 mg by mouth 2 (two) times daily as needed.   . Calcium Carbonate (CALCIUM-CARB 600 PO) Take 600 mg by mouth 2 (two) times daily.   . Carbidopa-Levodopa ER (SINEMET CR) 25-100 MG tablet controlled release Take 1 tablet by mouth 2 (two) times daily.  . Cranberry 450 MG TABS Take 450 mg by mouth in the morning and at bedtime.  . docusate sodium (COLACE) 100 MG capsule Take 100 mg by mouth 2 (two) times daily.  Marland Kitchen ketoconazole (NIZORAL) 2 % cream SMARTSIG:Sparingly Topical Twice Daily  . Multiple Vitamins-Minerals (MULTIVITAMIN ADULT PO) Take 1 tablet by mouth daily.   . mupirocin ointment (BACTROBAN) 2 % Apply 1 application topically 2 (two) times daily. Apply to wound as instructed.  . nystatin (MYCOSTATIN/NYSTOP) powder APPLY TO AFFECTED AREA 4 TIMES A DAY  . senna (SENOKOT) 8.6 MG TABS tablet Take 1 tablet by mouth daily as needed for mild constipation.   No facility-administered encounter medications on file as of 10/19/2020.    Objective: ROS/staff report  General: NAD EYES: can open/close ENMT: denies dysphagia Cardiovascular: denies chest pain Pulmonary: denies cough, denies increased SOB Abdomen: endorses fair appetite, denies constipation, endorses incontinence of bowel GU: denies dysuria, endorses incontinence of urine MSK:  Cannot move extremities no falls reported Skin: multiple Neurological: endorses weakness, painad 1/10, denies insomnia Psych: Endorses flat mood Heme/lymph/immuno: denies bruises, abnormal bleeding  Physical Exam/staff report: Current and past weights: 75-80 lbs estimated, 100 lbs a year ago. Constitutional:  NAD General: frail,  Thin EYES: anicteric sclera,lids intact, no discharge  CV: no LE edema Pulmonary: no increased work of breathing, no cough, no audible wheezes, room air Abdomen: intake  25%, no ascites GU: deferred MSK: severe sarcopenia,non ambulatory, bedbound Skin: friable, multiple tears and wounds. Neuro: Generalized weakness, severe cognitive impairment Psych: flat affect, Hem/lymph/immuno: no widespread bruising  Thank you for the opportunity to participate in the care of Ms. Clelia Croft.  The palliative care team will continue to follow. Please call our office at [phone] if we can be of additional assistance.  Eliezer Lofts, NP , DNP, MPH, AGPCNP-BC, [facility]

## 2020-10-24 ENCOUNTER — Other Ambulatory Visit: Payer: Self-pay

## 2020-10-24 ENCOUNTER — Encounter: Payer: Medicare Other | Admitting: Primary Care

## 2020-10-24 ENCOUNTER — Non-Acute Institutional Stay: Payer: Medicare Other | Admitting: Primary Care

## 2020-10-24 DIAGNOSIS — Z515 Encounter for palliative care: Secondary | ICD-10-CM

## 2020-10-24 DIAGNOSIS — L89153 Pressure ulcer of sacral region, stage 3: Secondary | ICD-10-CM

## 2020-10-24 DIAGNOSIS — R413 Other amnesia: Secondary | ICD-10-CM

## 2020-10-24 DIAGNOSIS — G20 Parkinson's disease: Secondary | ICD-10-CM

## 2020-10-24 DIAGNOSIS — G20A1 Parkinson's disease without dyskinesia, without mention of fluctuations: Secondary | ICD-10-CM

## 2020-10-24 NOTE — Progress Notes (Signed)
Designer, jewellery Palliative Care Consult Note Telephone: [phone]  Fax: [phone]     Date of encounter: 10/24/20 PATIENT NAME: [name] [place] [address] [phone] (home)  DOB: 05/03/1923 MRN: [id number]  PRIMARY CARE PROVIDER:    Venita Lick, NP,  [place] [address] [phone]  REFERRING PROVIDER:   Venita Lick, NP [place],  [place] [address] [phone]  RESPONSIBLE PARTY:   Extended Emergency Contact Information Primary Emergency Contact: [facility] Address: [place], [place] [address] [name] Phone: [phone] Relation: Daughter Secondary Emergency Contact: [name] Address: [place], [place] [address] [name] of [place] Phone: [phone] Relation: Daughter  I met face to face with patient and family in facility. Palliative Care was asked to follow this patient by consultation request of Tina Lick, NP / [facility], to help address advance care planning and goals of care. This is a follow up  visit.   ASSESSMENT AND RECOMMENDATIONS:   1. Advance Care Planning/Goals of Care: Goals include to maximize quality of life and symptom management. Our advance care planning conversation included a discussion about:     Experiences with loved ones who have been seriously ill or have died   Exploration of personal, cultural or spiritual beliefs that might influence medical decisions.  Exploration of goals of care in the event of a sudden injury or illness   Identification and preparation of a healthcare agent   Review  of an  advance directive document .  Decision to  de-escalate disease focused treatments due to poor prognosis.    2. Symptom Management:    Wound care: multiple wounds, likely Kennedy wounds. Wound care done by care home staff. Education that healing is not a  goal due to decline.  Nutrition: Not eating, x 4 days. Taking some water by syringe. Education given re aphagia and gi shut down.   Oliguria: Education provided secondary to dehydration due to decreased PO intake and normal shut down of organs in dying process.   3. Follow up Palliative Care Visit: Refer to hospice.   4. Family /Caregiver/Community Supports: Lives in [facility]  5. Cognitive / Functional decline: Severe decline/moribund  I spent 60 minutes providing this consultation,  from 16:00 to 17:00. More than 50% of the time in this consultation was spent in counseling and care coordination.  CODE STATUS:  DNR  PPS: 20%  HOSPICE ELIGIBILITY/DIAGNOSIS: TBD  Subjective:  CHIEF COMPLAINT: debility  HISTORY OF PRESENT ILLNESS:  [name] is a 87 y.o. year old female  with PD, dementia, wounds and skin break down, dysphagia. Pt presents with decreased intake and alertness. Can open eyes and track but non verbal. Family has been trying to help her take po but she is not able to swallow well or eat food in the context of PD and dementia. She is at end of life and family has decided to pursue hospice admission. Wounds have erupted due to skin failure and lack of intake over time. She is also unable to position to eat/ drink due to contractures.  We are asked to consult around advance care planning and complex medical decision making.    Review and summarization of old Epic records shows or history from other than patient . Review of case  with family member [name].  History obtained from review of EMR, discussion with primary team, and  interview with family, caregiver  and/or Ms. [name]. Records reviewed and summarized above.   CURRENT PROBLEM LIST:  Patient Active Problem List   Diagnosis Date Noted  . Aortic atherosclerosis ([place]) 09/22/2020  . UTI symptoms 02/20/2020  . Tinea corporis 03/31/2019  . Debility 03/07/2019  . Memory deficits 03/07/2019  . Palliative care encounter  03/07/2019  . Protein-calorie malnutrition, severe 11/10/2017  . Pressure injury of skin 11/09/2017  . Parkinson's disease ([place]) 12/16/2015  . Dysphagia 12/16/2015  . B12 deficiency 10/29/2015   PAST MEDICAL HISTORY:  Active Ambulatory Problems    Diagnosis Date Noted  . B12 deficiency 10/29/2015  . Parkinson's disease ([place]) 12/16/2015  . Dysphagia 12/16/2015  . Pressure injury of skin 11/09/2017  . Protein-calorie malnutrition, severe 11/10/2017  . Debility 03/07/2019  . Memory deficits 03/07/2019  . Palliative care encounter 03/07/2019  . Tinea corporis 03/31/2019  . UTI symptoms 02/20/2020  . Aortic atherosclerosis ([place]) 09/22/2020   Resolved Ambulatory Problems    Diagnosis Date Noted  . Hypertension   . Tremor 07/04/2015  . Traumatic closed displaced fracture of multiple ribs 10/29/2015  . Confusion 11/12/2015  . Altered mental status 12/16/2015  . Abnormal CXR 12/16/2015  . Pneumonia 12/17/2015  . Perimenopausal atrophic vaginitis 03/03/2016  . Cloudy urine 07/16/2016  . Bedsore 12/18/2016  . Sepsis ([place]) 11/08/2017  . Constipation 03/10/2018  . UTI (urinary tract infection) 02/07/2019   Past Medical History:  Diagnosis Date  . Anemia   . Arm fracture 03/2016  . Arthritis   . Mild dementia ([place])   . Parkinson disease ([place])    SOCIAL HX:  Social History   Tobacco Use  . Smoking status: Never Smoker  . Smokeless tobacco: Never Used  Substance Use Topics  . Alcohol use: No    Alcohol/week: 0.0 standard drinks   FAMILY HX:  Family History  Family history unknown: Yes      ALLERGIES:  Allergies  Allergen Reactions  . Phenergan [Promethazine Hcl] Other (See Comments)    Family states the patient almost went into a coma.  . Sulfa Antibiotics Other (See Comments)    Unknown reaction  . Sulfasalazine Other (See Comments)    Unknown reaction     PERTINENT MEDICATIONS:  Outpatient Encounter Medications as of 10/24/2020  Medication Sig  .  acetaminophen (TYLENOL) 325 MG tablet Take 650 mg by mouth 2 (two) times daily as needed.   . Calcium Carbonate (CALCIUM-CARB 600 PO) Take 600 mg by mouth 2 (two) times daily.   . Carbidopa-Levodopa ER (SINEMET CR) 25-100 MG tablet controlled release Take 1 tablet by mouth 2 (two) times daily.  . cephALEXin (KEFLEX) 500 MG capsule Take 500 mg by mouth 4 (four) times daily.  . Cranberry 450 MG TABS Take 450 mg by mouth in the morning and at bedtime.  . docusate sodium (COLACE) 100 MG capsule Take 100 mg by mouth 2 (two) times daily.  Marland Kitchen ketoconazole (NIZORAL) 2 % cream SMARTSIG:Sparingly Topical Twice Daily  . Multiple Vitamins-Minerals (MULTIVITAMIN ADULT PO) Take 1 tablet by mouth daily.   . mupirocin ointment (BACTROBAN) 2 % Apply 1 application topically 2 (two) times daily. Apply to wound as instructed.  . nystatin (MYCOSTATIN/NYSTOP) powder APPLY TO AFFECTED AREA 4 TIMES A DAY  . senna (SENOKOT) 8.6 MG TABS tablet Take 1 tablet by mouth daily as needed for mild constipation.   No facility-administered  encounter medications on file as of 10/24/2020.    Objective: ROS/staff  General: NAD ENMT: endorses dysphagia Pulmonary: denies  cough, denies increased SOB, endorses apnea Abdomen: endorses poor appetite, endorses constipation, endorses incontinence of bowel GU: endorses oliguria, endorses incontinence of urine MSK:  contratured, no falls reported  Skin: endorses multiple wounds Neurological: endorses immobility, denies pain, denies insomnia Psych: Endorses flat mood Heme/lymph/immuno: denies bruises, abnormal bleeding  Physical Exam: Current and past weights: 75 lbs estimated Constitutional:  NAD General: frail appearing, thin EYES: anicteric sclera, lids intact, no discharge  ENMT: intact hearing,oral mucous membranes dry CV:  No LE edema  Pulmonary: No increased work of breathing, 4-5 second apnea, no cough, no audible wheezes, room air Abdomen:sips/bites, no ascites  GU:  deferred                                            MSK: severe sarcopenia, ++ contractures of LE, non ambulatory Skin: very friable, multiple wounds on arms, legs, back , L shoulder. Neuro:  Severe cognitive impairment, contractures of UE and LE, Immobile Psych: flat affect, alert but not responsive. Hem/lymph/immuno: no widespread bruising   Thank you for the opportunity to participate in the care of Ms. [name].  The palliative care team will continue to follow. Please call our office at [phone] if we can be of additional assistance.  Jason Coop, NP , DNP, MPH, AGPCNP-BC, [facility]  COVID-19 PATIENT SCREENING TOOL  Person answering questions: ____________staff______ _____   1.  Is the patient or any family member in the home showing any signs or symptoms regarding respiratory infection?               Person with Symptom- __________NA_________________  a. Fever                                                                          Yes___ No___          ___________________  b. Shortness of breath                                                    Yes___ No___          ___________________ c. Cough/congestion                                       Yes___  No___         ___________________ d. Body aches/pains                                                         Yes___ No___        ____________________ e. Gastrointestinal symptoms (diarrhea, nausea)  Yes___ No___        ____________________  2. Within the past 14 days, has anyone living in the home had any contact with someone with or under investigation for COVID-19?    Yes___ No_X_   Person __________________

## 2020-10-25 ENCOUNTER — Encounter: Payer: Medicare Other | Admitting: Primary Care

## 2020-10-25 ENCOUNTER — Other Ambulatory Visit: Payer: Self-pay

## 2020-10-25 ENCOUNTER — Other Ambulatory Visit: Payer: Medicare Other | Admitting: Primary Care

## 2020-10-25 DIAGNOSIS — G20A1 Parkinson's disease without dyskinesia, without mention of fluctuations: Secondary | ICD-10-CM

## 2020-10-25 DIAGNOSIS — G20 Parkinson's disease: Secondary | ICD-10-CM

## 2020-10-25 DIAGNOSIS — L89153 Pressure ulcer of sacral region, stage 3: Secondary | ICD-10-CM

## 2020-10-25 DIAGNOSIS — Z515 Encounter for palliative care: Secondary | ICD-10-CM

## 2020-10-25 NOTE — Progress Notes (Signed)
Therapist, nutritional Palliative Care Consult Note Telephone: [phone]  Fax: [phone]    TELEHEALTH VISIT STATEMENT Due to the COVID-19 crisis, this visit was done via telemedicine from my office. It was initiated and consented to by this patient and/or family.   Date of encounter: 10/25/20 PATIENT NAME: [name] [address] [place] [address] [phone] (home)  DOB: [birth year] MRN: [id number]  PRIMARY CARE PROVIDER:    Marjie Skiff, NP,  [address] [place] [address] [phone]  REFERRING PROVIDER:   Marjie Skiff, NP [address] [place],  [place] [address] [phone]  RESPONSIBLE PARTY:   Extended Emergency Contact Information Primary Emergency Contact: [facility] Address: [address]          [place], [place] [address] Darden Amber of [place] Home Phone: [phone] Relation: Daughter Secondary Emergency Contact: [name],[name] Address: [address]          [place], [place] [address] Darden Amber of [place] Home Phone: [phone] Relation: Daughter   Palliative Care was asked to follow this patient by consultation request of Marjie Skiff, NP to help address advance care planning and goals of care. This is a follow up  visit.   ASSESSMENT AND RECOMMENDATIONS:   1. Advance Care Planning/Goals of Care: Goals include to maximize quality of life and symptom management. Our advance care planning conversation included a discussion about:      Hospice referral pending from [facility]. Another company called but unable to reach party to discuss stat admission. Meanwhile, patient is moribund, death expected  within 71 ours. Unable to achieve hospice admission due to staffing and time constraints. Discussed palliative end of life management and family and care givers in agreement. I have faxed orders for atropine 1 % 2 gtts q1h prn secretions, and morphine 2.5 mg po q 1 hr prn pain or dyspnea. Staff reports some labor of  breathing and erratic breathing, apnea and fish mouth breathing.   Education provided to family RE symptom management. Education provided to staff. Unable to obtain from their pharmacy in a timely fashion so sent to local pharmacy and family will take to care home.  2. Symptom Management:  See above  3. Follow up Palliative Care Visit: Palliative care will continue to follow for goals of care clarification and symptom management. Return prn if not admitted to hospice.  4. Family /Caregiver/Community Supports: Daughter Luster Landsberg is Financial planner. She voices understanding to plan.  5. Cognitive / Functional decline: moribund  I spent 40 minutes providing this consultation,  from 0930 to 1010. More than 50% of the time in this consultation was spent in counseling and care coordination.  CODE STATUS: DNR  PPS: 10%  HOSPICE ELIGIBILITY/DIAGNOSIS: yes/ PD  Subjective:  CHIEF COMPLAINT: end of life care management.  HISTORY OF PRESENT ILLNESS:  [name] is a 87 y.o. year old female  with PD and end of life. Hospice referral made but services not likely to arrive prior to death. Management per palliative provider.   We are asked to consult around advance care planning and complex medical decision making.    History obtained from review of EMR, discussion with primary team, and  interview with family, caregiver  and/or [name]. Records reviewed and summarized above.   CURRENT PROBLEM LIST:  Patient Active Problem List   Diagnosis Date Noted  . Aortic atherosclerosis (HCC) 09/22/2020  . UTI symptoms 02/20/2020  . Tinea corporis 03/31/2019  .  Debility 03/07/2019  . Memory deficits 03/07/2019  . Palliative care encounter 03/07/2019  . Protein-calorie malnutrition, severe 11/10/2017  . Pressure injury of skin 11/09/2017  . Parkinson's disease (HCC) 12/16/2015  . Dysphagia 12/16/2015  . B12 deficiency 10/29/2015   PAST MEDICAL HISTORY:  Active Ambulatory Problems     Diagnosis Date Noted  . B12 deficiency 10/29/2015  . Parkinson's disease (HCC) 12/16/2015  . Dysphagia 12/16/2015  . Pressure injury of skin 11/09/2017  . Protein-calorie malnutrition, severe 11/10/2017  . Debility 03/07/2019  . Memory deficits 03/07/2019  . Palliative care encounter 03/07/2019  . Tinea corporis 03/31/2019  . UTI symptoms 02/20/2020  . Aortic atherosclerosis (HCC) 09/22/2020   Resolved Ambulatory Problems    Diagnosis Date Noted  . Hypertension   . Tremor 07/04/2015  . Traumatic closed displaced fracture of multiple ribs 10/29/2015  . Confusion 11/12/2015  . Altered mental status 12/16/2015  . Abnormal CXR 12/16/2015  . Pneumonia 12/17/2015  . Perimenopausal atrophic vaginitis 03/03/2016  . Cloudy urine 07/16/2016  . [place] 12/18/2016  . Sepsis (HCC) 11/08/2017  . Constipation 03/10/2018  . UTI (urinary tract infection) 02/07/2019   Past Medical History:  Diagnosis Date  . Anemia   . Arm fracture 03/2016  . Arthritis   . Mild dementia (HCC)   . Parkinson disease (HCC)    SOCIAL HX:  Social History   Tobacco Use  . Smoking status: Never Smoker  . Smokeless tobacco: Never Used  Substance Use Topics  . Alcohol use: No    Alcohol/week: 0.0 standard drinks   FAMILY HX:  Family History  Family history unknown: Yes      ALLERGIES:  Allergies  Allergen Reactions  . Phenergan [Promethazine Hcl] Other (See Comments)    Family states the patient almost went into a coma.  . Sulfa Antibiotics Other (See Comments)    Unknown reaction  . Sulfasalazine Other (See Comments)    Unknown reaction     PERTINENT MEDICATIONS:  Outpatient Encounter Medications as of 10/25/2020  Medication Sig  . atropine 1 % ophthalmic solution Place 2 drops under the tongue every hour as needed (terminal secretions).  . Morphine Sulfate (MORPHINE CONCENTRATE) 10 mg / 0.5 ml concentrated solution Take 2.5 mg by mouth every hour as needed for severe pain or shortness of  breath.  Marland Kitchen acetaminophen (TYLENOL) 325 MG tablet Take 650 mg by mouth 2 (two) times daily as needed.   . Calcium Carbonate (CALCIUM-CARB 600 PO) Take 600 mg by mouth 2 (two) times daily.   . Carbidopa-Levodopa ER (SINEMET CR) 25-100 MG tablet controlled release Take 1 tablet by mouth 2 (two) times daily.  . cephALEXin (KEFLEX) 500 MG capsule Take 500 mg by mouth 4 (four) times daily.  . Cranberry 450 MG TABS Take 450 mg by mouth in the morning and at bedtime.  . docusate sodium (COLACE) 100 MG capsule Take 100 mg by mouth 2 (two) times daily.  Marland Kitchen ketoconazole (NIZORAL) 2 % cream SMARTSIG:Sparingly Topical Twice Daily  . Multiple Vitamins-Minerals (MULTIVITAMIN ADULT PO) Take 1 tablet by mouth daily.   . mupirocin ointment (BACTROBAN) 2 % Apply 1 application topically 2 (two) times daily. Apply to wound as instructed.  . nystatin (MYCOSTATIN/NYSTOP) powder APPLY TO AFFECTED AREA 4 TIMES A DAY  . senna (SENOKOT) 8.6 MG TABS tablet Take 1 tablet by mouth daily as needed for mild constipation.   No facility-administered encounter medications on file as of 10/25/2020.     Objective: deferred  Thank  you for the opportunity to participate in the care of [name].  The palliative care team will continue to follow. Please call our office at [phone] if we can be of additional assistance.  Eliezer Lofts, NP , DNP, MPH, AGPCNP-BC, [facility]

## 2020-10-26 ENCOUNTER — Ambulatory Visit: Payer: Medicare Other | Admitting: Physician Assistant

## 2020-10-26 ENCOUNTER — Telehealth: Payer: Self-pay

## 2020-10-30 NOTE — Telephone Encounter (Signed)
Called and spoke to family and provided condolences for their loss.

## 2020-10-30 NOTE — Telephone Encounter (Signed)
Routing to provider. FYI.  

## 2020-10-30 NOTE — Telephone Encounter (Signed)
Copied from CRM [id number]. Topic: General - Other >> November 05, 2020 11:56 AM Gaetana Michaelis A wrote: Reason for CRM: Quincy Sheehan from [facility] contacted practice to notify of patient's death Patient was admitted on 2020/11/04 and passed away on November 05, 2020

## 2020-10-30 DEATH — deceased
